# Patient Record
Sex: Female | Born: 1940
Health system: Southern US, Community
[De-identification: ages and names within clinical notes are randomized; demographics above are authoritative.]

## PROBLEM LIST (undated history)

## (undated) DIAGNOSIS — K219 Gastro-esophageal reflux disease without esophagitis: Secondary | ICD-10-CM

## (undated) DIAGNOSIS — I10 Essential (primary) hypertension: Secondary | ICD-10-CM

## (undated) DIAGNOSIS — N189 Chronic kidney disease, unspecified: Secondary | ICD-10-CM

## (undated) DIAGNOSIS — E119 Type 2 diabetes mellitus without complications: Secondary | ICD-10-CM

## (undated) DIAGNOSIS — I4891 Unspecified atrial fibrillation: Secondary | ICD-10-CM

## (undated) DIAGNOSIS — I509 Heart failure, unspecified: Secondary | ICD-10-CM

## (undated) DIAGNOSIS — J189 Pneumonia, unspecified organism: Secondary | ICD-10-CM

## (undated) DIAGNOSIS — J8410 Pulmonary fibrosis, unspecified: Secondary | ICD-10-CM

## (undated) DIAGNOSIS — D649 Anemia, unspecified: Secondary | ICD-10-CM

## (undated) DIAGNOSIS — J449 Chronic obstructive pulmonary disease, unspecified: Secondary | ICD-10-CM

## (undated) DIAGNOSIS — E785 Hyperlipidemia, unspecified: Secondary | ICD-10-CM

## (undated) HISTORY — PX: CATARACT EXTRACTION: SUR2

## (undated) HISTORY — DX: Gastro-esophageal reflux disease without esophagitis: K21.9

## (undated) HISTORY — DX: Heart failure, unspecified: I50.9

## (undated) HISTORY — DX: Unspecified atrial fibrillation: I48.91

## (undated) HISTORY — DX: Hyperlipidemia, unspecified: E78.5

## (undated) HISTORY — PX: CHOLECYSTECTOMY: SHX55

## (undated) HISTORY — DX: Pulmonary fibrosis, unspecified: J84.10

## (undated) HISTORY — PX: ABDOMINAL HYSTERECTOMY: SHX81

## (undated) HISTORY — DX: Essential (primary) hypertension: I10

## (undated) HISTORY — DX: Anemia, unspecified: D64.9

## (undated) HISTORY — DX: Chronic kidney disease, unspecified: N18.9

## (undated) HISTORY — PX: APPENDECTOMY: SHX54

## (undated) HISTORY — PX: OTHER SURGICAL HISTORY: SHX169

## (undated) HISTORY — PX: HERNIA REPAIR: SHX51

## (undated) HISTORY — DX: Pneumonia, unspecified organism: J18.9

## (undated) HISTORY — DX: Type 2 diabetes mellitus without complications: E11.9

---

## 2004-06-07 DIAGNOSIS — I4891 Unspecified atrial fibrillation: Secondary | ICD-10-CM | POA: Insufficient documentation

## 2004-09-24 DIAGNOSIS — I10 Essential (primary) hypertension: Secondary | ICD-10-CM | POA: Insufficient documentation

## 2006-10-03 DIAGNOSIS — E1122 Type 2 diabetes mellitus with diabetic chronic kidney disease: Secondary | ICD-10-CM | POA: Insufficient documentation

## 2007-09-27 ENCOUNTER — Inpatient Hospital Stay: Payer: Self-pay | Admitting: Internal Medicine

## 2007-09-27 ENCOUNTER — Other Ambulatory Visit: Payer: Self-pay

## 2007-09-28 ENCOUNTER — Other Ambulatory Visit: Payer: Self-pay

## 2007-09-30 ENCOUNTER — Other Ambulatory Visit: Payer: Self-pay

## 2008-04-21 ENCOUNTER — Emergency Department: Payer: Self-pay | Admitting: Emergency Medicine

## 2008-04-27 ENCOUNTER — Ambulatory Visit: Payer: Self-pay | Admitting: Internal Medicine

## 2008-04-27 ENCOUNTER — Emergency Department: Payer: Self-pay | Admitting: Emergency Medicine

## 2008-04-29 ENCOUNTER — Ambulatory Visit: Payer: Self-pay | Admitting: Internal Medicine

## 2008-05-05 ENCOUNTER — Other Ambulatory Visit: Payer: Self-pay

## 2008-05-05 ENCOUNTER — Inpatient Hospital Stay: Payer: Self-pay | Admitting: Internal Medicine

## 2008-06-17 ENCOUNTER — Ambulatory Visit: Payer: Self-pay | Admitting: Orthopedic Surgery

## 2008-06-19 ENCOUNTER — Ambulatory Visit: Payer: Self-pay | Admitting: Orthopedic Surgery

## 2008-11-13 ENCOUNTER — Ambulatory Visit: Payer: Self-pay | Admitting: Internal Medicine

## 2008-12-30 ENCOUNTER — Ambulatory Visit: Payer: Self-pay | Admitting: Gastroenterology

## 2008-12-31 ENCOUNTER — Ambulatory Visit: Payer: Self-pay | Admitting: Internal Medicine

## 2009-01-14 ENCOUNTER — Ambulatory Visit: Payer: Self-pay | Admitting: Gastroenterology

## 2009-01-19 ENCOUNTER — Ambulatory Visit: Payer: Self-pay | Admitting: Internal Medicine

## 2009-01-25 ENCOUNTER — Ambulatory Visit: Payer: Self-pay | Admitting: Internal Medicine

## 2009-02-20 ENCOUNTER — Inpatient Hospital Stay: Payer: Self-pay | Admitting: Internal Medicine

## 2009-05-19 ENCOUNTER — Ambulatory Visit: Payer: Self-pay | Admitting: Nephrology

## 2009-08-23 ENCOUNTER — Ambulatory Visit: Payer: Self-pay | Admitting: Otolaryngology

## 2009-09-12 ENCOUNTER — Emergency Department: Payer: Self-pay | Admitting: Emergency Medicine

## 2009-09-18 ENCOUNTER — Inpatient Hospital Stay: Payer: Self-pay | Admitting: Internal Medicine

## 2010-12-30 ENCOUNTER — Ambulatory Visit: Payer: Self-pay

## 2011-01-21 ENCOUNTER — Ambulatory Visit: Payer: Self-pay | Admitting: Internal Medicine

## 2011-02-01 DIAGNOSIS — M25559 Pain in unspecified hip: Secondary | ICD-10-CM | POA: Insufficient documentation

## 2011-02-01 DIAGNOSIS — D481 Neoplasm of uncertain behavior of connective and other soft tissue: Secondary | ICD-10-CM | POA: Insufficient documentation

## 2011-03-15 DIAGNOSIS — D2120 Benign neoplasm of connective and other soft tissue of unspecified lower limb, including hip: Secondary | ICD-10-CM | POA: Insufficient documentation

## 2011-04-10 ENCOUNTER — Emergency Department: Payer: Self-pay | Admitting: Emergency Medicine

## 2011-06-07 ENCOUNTER — Other Ambulatory Visit: Payer: Self-pay | Admitting: Ophthalmology

## 2011-06-20 ENCOUNTER — Ambulatory Visit: Payer: Self-pay | Admitting: Ophthalmology

## 2011-08-29 ENCOUNTER — Inpatient Hospital Stay: Payer: Self-pay | Admitting: Internal Medicine

## 2012-03-01 DIAGNOSIS — E669 Obesity, unspecified: Secondary | ICD-10-CM | POA: Insufficient documentation

## 2012-03-01 DIAGNOSIS — N184 Chronic kidney disease, stage 4 (severe): Secondary | ICD-10-CM | POA: Insufficient documentation

## 2012-03-01 DIAGNOSIS — I5042 Chronic combined systolic (congestive) and diastolic (congestive) heart failure: Secondary | ICD-10-CM | POA: Insufficient documentation

## 2012-03-01 DIAGNOSIS — E1122 Type 2 diabetes mellitus with diabetic chronic kidney disease: Secondary | ICD-10-CM | POA: Insufficient documentation

## 2012-03-01 DIAGNOSIS — I5043 Acute on chronic combined systolic (congestive) and diastolic (congestive) heart failure: Secondary | ICD-10-CM | POA: Insufficient documentation

## 2012-05-14 ENCOUNTER — Ambulatory Visit: Payer: Self-pay | Admitting: Ophthalmology

## 2012-10-11 ENCOUNTER — Ambulatory Visit: Payer: Self-pay | Admitting: Physician Assistant

## 2012-11-15 ENCOUNTER — Ambulatory Visit: Payer: Self-pay | Admitting: Gastroenterology

## 2012-12-07 ENCOUNTER — Emergency Department: Payer: Self-pay | Admitting: Emergency Medicine

## 2012-12-07 LAB — CBC
HCT: 41.7 % (ref 35.0–47.0)
HGB: 13.5 g/dL (ref 12.0–16.0)
MCHC: 32.4 g/dL (ref 32.0–36.0)
RBC: 4.47 10*6/uL (ref 3.80–5.20)
WBC: 9.1 10*3/uL (ref 3.6–11.0)

## 2012-12-07 LAB — BASIC METABOLIC PANEL
Anion Gap: 5 — ABNORMAL LOW (ref 7–16)
Calcium, Total: 8.6 mg/dL (ref 8.5–10.1)
Chloride: 108 mmol/L — ABNORMAL HIGH (ref 98–107)
Co2: 29 mmol/L (ref 21–32)
Creatinine: 1.45 mg/dL — ABNORMAL HIGH (ref 0.60–1.30)
EGFR (African American): 42 — ABNORMAL LOW
Osmolality: 289 (ref 275–301)
Potassium: 4.2 mmol/L (ref 3.5–5.1)

## 2012-12-07 LAB — CK TOTAL AND CKMB (NOT AT ARMC): CK-MB: 2.1 ng/mL (ref 0.5–3.6)

## 2012-12-07 LAB — TROPONIN I: Troponin-I: <0.02 ng/mL

## 2012-12-07 LAB — PRO B NATRIURETIC PEPTIDE: B-Type Natriuretic Peptide: 1173 pg/mL — ABNORMAL HIGH (ref 0–125)

## 2012-12-17 ENCOUNTER — Ambulatory Visit: Payer: Self-pay | Admitting: Gastroenterology

## 2012-12-17 LAB — PROTIME-INR: Prothrombin Time: 13.7 secs (ref 11.5–14.7)

## 2012-12-19 LAB — PATHOLOGY REPORT

## 2013-04-09 ENCOUNTER — Other Ambulatory Visit: Payer: Self-pay | Admitting: Physician Assistant

## 2013-04-09 ENCOUNTER — Ambulatory Visit: Payer: Self-pay | Admitting: Physician Assistant

## 2013-04-09 LAB — CBC WITH DIFFERENTIAL/PLATELET
Basophil #: 0.1 10*3/uL (ref 0.0–0.1)
Eosinophil %: 2 %
HCT: 38.9 % (ref 35.0–47.0)
HGB: 12.8 g/dL (ref 12.0–16.0)
Lymphocyte #: 2.5 10*3/uL (ref 1.0–3.6)
Lymphocyte %: 34.3 %
MCH: 30.3 pg (ref 26.0–34.0)
MCV: 92 fL (ref 80–100)
Monocyte %: 6.2 %
Neutrophil %: 56.8 %
RBC: 4.21 10*6/uL (ref 3.80–5.20)
RDW: 14.4 % (ref 11.5–14.5)

## 2013-04-09 LAB — COMPREHENSIVE METABOLIC PANEL
Albumin: 2.6 g/dL — ABNORMAL LOW (ref 3.4–5.0)
Alkaline Phosphatase: 75 U/L (ref 50–136)
BUN: 18 mg/dL (ref 7–18)
Bilirubin,Total: 0.4 mg/dL (ref 0.2–1.0)
Calcium, Total: 8.8 mg/dL (ref 8.5–10.1)
Chloride: 106 mmol/L (ref 98–107)
Co2: 30 mmol/L (ref 21–32)
EGFR (African American): 35 — ABNORMAL LOW
Osmolality: 284 (ref 275–301)
Potassium: 4.1 mmol/L (ref 3.5–5.1)
SGOT(AST): 33 U/L (ref 15–37)
Sodium: 140 mmol/L (ref 136–145)

## 2013-07-27 ENCOUNTER — Emergency Department: Payer: Self-pay | Admitting: Emergency Medicine

## 2013-11-16 ENCOUNTER — Emergency Department: Payer: Self-pay | Admitting: Internal Medicine

## 2013-11-16 LAB — CBC
HCT: 42.4 % (ref 35.0–47.0)
HGB: 14.1 g/dL (ref 12.0–16.0)
MCH: 31.2 pg (ref 26.0–34.0)
MCHC: 33.2 g/dL (ref 32.0–36.0)
MCV: 94 fL (ref 80–100)
Platelet: 230 10*3/uL (ref 150–440)
RBC: 4.51 10*6/uL (ref 3.80–5.20)
RDW: 14.3 % (ref 11.5–14.5)
WBC: 8.4 10*3/uL (ref 3.6–11.0)

## 2013-11-16 LAB — PROTIME-INR
INR: 1.3
Prothrombin Time: 16 secs — ABNORMAL HIGH (ref 11.5–14.7)

## 2013-11-16 LAB — COMPREHENSIVE METABOLIC PANEL
ALBUMIN: 2.9 g/dL — AB (ref 3.4–5.0)
ANION GAP: 5 — AB (ref 7–16)
AST: 22 U/L (ref 15–37)
Alkaline Phosphatase: 82 U/L
BILIRUBIN TOTAL: 0.5 mg/dL (ref 0.2–1.0)
BUN: 20 mg/dL — ABNORMAL HIGH (ref 7–18)
CHLORIDE: 104 mmol/L (ref 98–107)
Calcium, Total: 8.9 mg/dL (ref 8.5–10.1)
Co2: 26 mmol/L (ref 21–32)
Creatinine: 1.28 mg/dL (ref 0.60–1.30)
EGFR (African American): 48 — ABNORMAL LOW
EGFR (Non-African Amer.): 42 — ABNORMAL LOW
GLUCOSE: 145 mg/dL — AB (ref 65–99)
Osmolality: 275 (ref 275–301)
Potassium: 3.8 mmol/L (ref 3.5–5.1)
SGPT (ALT): 13 U/L (ref 12–78)
SODIUM: 135 mmol/L — AB (ref 136–145)
Total Protein: 7.8 g/dL (ref 6.4–8.2)

## 2013-11-16 LAB — TROPONIN I: Troponin-I: <0.02 ng/mL

## 2013-11-16 LAB — PRO B NATRIURETIC PEPTIDE: B-Type Natriuretic Peptide: 728 pg/mL — ABNORMAL HIGH (ref 0–125)

## 2014-03-02 ENCOUNTER — Ambulatory Visit: Payer: Self-pay | Admitting: Internal Medicine

## 2014-06-30 LAB — CBC
HCT: 41.3 % (ref 35.0–47.0)
HGB: 13.5 g/dL (ref 12.0–16.0)
MCH: 31.9 pg (ref 26.0–34.0)
MCHC: 32.5 g/dL (ref 32.0–36.0)
MCV: 98 fL (ref 80–100)
PLATELETS: 181 10*3/uL (ref 150–440)
RBC: 4.22 10*6/uL (ref 3.80–5.20)
RDW: 14.6 % — ABNORMAL HIGH (ref 11.5–14.5)
WBC: 11.4 10*3/uL — AB (ref 3.6–11.0)

## 2014-06-30 LAB — BASIC METABOLIC PANEL
Anion Gap: 8 (ref 7–16)
BUN: 10 mg/dL (ref 7–18)
CO2: 28 mmol/L (ref 21–32)
Calcium, Total: 8.1 mg/dL — ABNORMAL LOW (ref 8.5–10.1)
Chloride: 106 mmol/L (ref 98–107)
Creatinine: 1.41 mg/dL — ABNORMAL HIGH (ref 0.60–1.30)
EGFR (African American): 43 — ABNORMAL LOW
GFR CALC NON AF AMER: 37 — AB
Glucose: 141 mg/dL — ABNORMAL HIGH (ref 65–99)
Osmolality: 285 (ref 275–301)
POTASSIUM: 4.2 mmol/L (ref 3.5–5.1)
SODIUM: 142 mmol/L (ref 136–145)

## 2014-06-30 LAB — TROPONIN I

## 2014-07-01 ENCOUNTER — Observation Stay: Payer: Self-pay | Admitting: Internal Medicine

## 2014-07-01 LAB — PROTIME-INR
INR: 1.5
Prothrombin Time: 17.4 secs — ABNORMAL HIGH (ref 11.5–14.7)

## 2014-07-01 LAB — LIPID PANEL
Cholesterol: 89 mg/dL (ref 0–200)
HDL Cholesterol: 36 mg/dL — ABNORMAL LOW (ref 40–60)
Ldl Cholesterol, Calc: 37 mg/dL (ref 0–100)
Triglycerides: 82 mg/dL (ref 0–200)
VLDL Cholesterol, Calc: 16 mg/dL (ref 5–40)

## 2014-07-01 LAB — PRO B NATRIURETIC PEPTIDE: B-Type Natriuretic Peptide: 5126 pg/mL — ABNORMAL HIGH (ref 0–125)

## 2014-07-02 LAB — BASIC METABOLIC PANEL
Anion Gap: 7 (ref 7–16)
BUN: 19 mg/dL — ABNORMAL HIGH (ref 7–18)
Calcium, Total: 8.4 mg/dL — ABNORMAL LOW (ref 8.5–10.1)
Chloride: 101 mmol/L (ref 98–107)
Co2: 31 mmol/L (ref 21–32)
Creatinine: 1.48 mg/dL — ABNORMAL HIGH (ref 0.60–1.30)
EGFR (African American): 40 — ABNORMAL LOW
EGFR (Non-African Amer.): 35 — ABNORMAL LOW
Glucose: 86 mg/dL (ref 65–99)
Osmolality: 279 (ref 275–301)
Potassium: 4.1 mmol/L (ref 3.5–5.1)
Sodium: 139 mmol/L (ref 136–145)

## 2014-07-02 LAB — PROTIME-INR
INR: 1.6
INR: 1.6
Prothrombin Time: 18.4 secs — ABNORMAL HIGH (ref 11.5–14.7)
Prothrombin Time: 18.7 secs — ABNORMAL HIGH (ref 11.5–14.7)

## 2014-07-02 LAB — TSH: Thyroid Stimulating Horm: 2.31 u[IU]/mL

## 2014-07-03 LAB — BASIC METABOLIC PANEL
Anion Gap: 8 (ref 7–16)
BUN: 29 mg/dL — ABNORMAL HIGH (ref 7–18)
Calcium, Total: 8.3 mg/dL — ABNORMAL LOW (ref 8.5–10.1)
Chloride: 100 mmol/L (ref 98–107)
Co2: 29 mmol/L (ref 21–32)
Creatinine: 1.67 mg/dL — ABNORMAL HIGH (ref 0.60–1.30)
EGFR (African American): 35 — ABNORMAL LOW
GFR CALC NON AF AMER: 30 — AB
GLUCOSE: 136 mg/dL — AB (ref 65–99)
Osmolality: 282 (ref 275–301)
Potassium: 3.7 mmol/L (ref 3.5–5.1)
Sodium: 137 mmol/L (ref 136–145)

## 2014-07-03 LAB — PROTIME-INR
INR: 1.7
PROTHROMBIN TIME: 19.5 s — AB (ref 11.5–14.7)

## 2014-07-06 LAB — CULTURE, BLOOD (SINGLE)

## 2014-10-29 DIAGNOSIS — R319 Hematuria, unspecified: Secondary | ICD-10-CM | POA: Diagnosis not present

## 2014-11-04 DIAGNOSIS — J449 Chronic obstructive pulmonary disease, unspecified: Secondary | ICD-10-CM | POA: Diagnosis not present

## 2014-11-04 DIAGNOSIS — E1142 Type 2 diabetes mellitus with diabetic polyneuropathy: Secondary | ICD-10-CM | POA: Diagnosis not present

## 2014-11-04 DIAGNOSIS — G4733 Obstructive sleep apnea (adult) (pediatric): Secondary | ICD-10-CM | POA: Diagnosis not present

## 2014-11-12 DIAGNOSIS — I130 Hypertensive heart and chronic kidney disease with heart failure and stage 1 through stage 4 chronic kidney disease, or unspecified chronic kidney disease: Secondary | ICD-10-CM | POA: Diagnosis not present

## 2014-11-12 DIAGNOSIS — I482 Chronic atrial fibrillation: Secondary | ICD-10-CM | POA: Diagnosis not present

## 2014-11-12 DIAGNOSIS — Z7901 Long term (current) use of anticoagulants: Secondary | ICD-10-CM | POA: Diagnosis not present

## 2014-11-12 DIAGNOSIS — K210 Gastro-esophageal reflux disease with esophagitis: Secondary | ICD-10-CM | POA: Diagnosis not present

## 2014-11-12 DIAGNOSIS — E1142 Type 2 diabetes mellitus with diabetic polyneuropathy: Secondary | ICD-10-CM | POA: Diagnosis not present

## 2014-11-12 DIAGNOSIS — N390 Urinary tract infection, site not specified: Secondary | ICD-10-CM | POA: Diagnosis not present

## 2014-11-12 DIAGNOSIS — R319 Hematuria, unspecified: Secondary | ICD-10-CM | POA: Diagnosis not present

## 2014-11-12 DIAGNOSIS — M542 Cervicalgia: Secondary | ICD-10-CM | POA: Diagnosis not present

## 2014-11-17 DIAGNOSIS — G4733 Obstructive sleep apnea (adult) (pediatric): Secondary | ICD-10-CM | POA: Diagnosis not present

## 2014-11-30 DIAGNOSIS — G4733 Obstructive sleep apnea (adult) (pediatric): Secondary | ICD-10-CM | POA: Diagnosis not present

## 2014-12-09 DIAGNOSIS — N183 Chronic kidney disease, stage 3 (moderate): Secondary | ICD-10-CM | POA: Diagnosis not present

## 2014-12-09 DIAGNOSIS — G4733 Obstructive sleep apnea (adult) (pediatric): Secondary | ICD-10-CM | POA: Diagnosis not present

## 2014-12-09 DIAGNOSIS — I10 Essential (primary) hypertension: Secondary | ICD-10-CM | POA: Diagnosis not present

## 2014-12-31 DIAGNOSIS — R319 Hematuria, unspecified: Secondary | ICD-10-CM | POA: Diagnosis not present

## 2015-01-07 DIAGNOSIS — I130 Hypertensive heart and chronic kidney disease with heart failure and stage 1 through stage 4 chronic kidney disease, or unspecified chronic kidney disease: Secondary | ICD-10-CM | POA: Diagnosis not present

## 2015-01-07 DIAGNOSIS — F330 Major depressive disorder, recurrent, mild: Secondary | ICD-10-CM | POA: Diagnosis not present

## 2015-01-07 DIAGNOSIS — R319 Hematuria, unspecified: Secondary | ICD-10-CM | POA: Diagnosis not present

## 2015-01-07 DIAGNOSIS — I482 Chronic atrial fibrillation: Secondary | ICD-10-CM | POA: Diagnosis not present

## 2015-01-07 DIAGNOSIS — J449 Chronic obstructive pulmonary disease, unspecified: Secondary | ICD-10-CM | POA: Diagnosis not present

## 2015-01-07 DIAGNOSIS — Z7901 Long term (current) use of anticoagulants: Secondary | ICD-10-CM | POA: Diagnosis not present

## 2015-02-10 DIAGNOSIS — I482 Chronic atrial fibrillation: Secondary | ICD-10-CM | POA: Diagnosis not present

## 2015-02-10 DIAGNOSIS — Z7901 Long term (current) use of anticoagulants: Secondary | ICD-10-CM | POA: Diagnosis not present

## 2015-02-10 DIAGNOSIS — I130 Hypertensive heart and chronic kidney disease with heart failure and stage 1 through stage 4 chronic kidney disease, or unspecified chronic kidney disease: Secondary | ICD-10-CM | POA: Diagnosis not present

## 2015-02-10 DIAGNOSIS — M25511 Pain in right shoulder: Secondary | ICD-10-CM | POA: Diagnosis not present

## 2015-02-12 DIAGNOSIS — M25511 Pain in right shoulder: Secondary | ICD-10-CM | POA: Diagnosis not present

## 2015-02-12 DIAGNOSIS — I130 Hypertensive heart and chronic kidney disease with heart failure and stage 1 through stage 4 chronic kidney disease, or unspecified chronic kidney disease: Secondary | ICD-10-CM | POA: Diagnosis not present

## 2015-02-12 DIAGNOSIS — I482 Chronic atrial fibrillation: Secondary | ICD-10-CM | POA: Diagnosis not present

## 2015-02-13 NOTE — H&P (Signed)
PATIENT NAME:  [name], [name] MR#:  [id number] DATE OF BIRTH:  12-05-1940  DATE OF ADMISSION:  07/01/2014  REFERRING PHYSICIAN: Valli Glance. Owens Shark, MD  PRIMARY CARE PHYSICIAN: Dr. Radford Pax.  ADMIT DIAGNOSIS: Pneumonia and possible pulmonary edema.   HISTORY OF PRESENT ILLNESS: This is a 74 year old Caucasian female who presents to the Emergency Department after experiencing shortness of breath for a week. Prior to deciding to come to the hospital, the patient felt like she just could not breathe anymore. This has been an intermittent problem for 1 week now. She has been seeing her primary care doctor over this period of time in order to control her atrial fibrillation. He has started her on multiple antiarrhythmics, but the patient has become progressively weaker. She complains of cough as well, but denies any chest pain. Chest x-ray revealed some patchy opacities as well as interstitial changes that could be consistent with edema, which prompted the Emergency Department to call for admission.   REVIEW OF SYSTEMS:  CONSTITUTIONAL: The patient denies fever, but admits to weakness.  EYES: The patient denies diplopia or inflammation.  ENT: The patient denies tinnitus or difficulty swallowing.  RESPIRATORY: The patient admits to cough and shortness of breath.  CARDIOVASCULAR: The patient denies chest pain, orthopnea or palpitations.  GASTROINTESTINAL: The patient denies nausea, vomiting or abdominal pain.  GENITOURINARY: The patient denies dysuria, increased frequency or hesitancy.  ENDOCRINE: The patient denies polyuria or nocturia.  INTEGUMENTARY: The patient denies rashes or lesions.  HEMATOLOGIC AND LYMPHATIC: The patient denies easy bruising or bleeding.  MUSCULOSKELETAL: The patient denies myalgias or arthralgias.  NEUROLOGIC: The patient denies numbness in her extremities or dysarthria.  PSYCHIATRIC: The patient denies depression or suicidal ideation.   PAST MEDICAL HISTORY:  1. Atrial  fibrillation.  2. Diabetes type 2.  3. Restless leg syndrome.  4. Hyperlipidemia. 5. Nephrolithiasis.   PAST SURGICAL HISTORY:  1. Cholecystectomy. 2. Nephrectomy secondary to nephrolithiasis.  3. Hernia repair.  4. Appendectomy. 5. Partial hysterectomy.   FAMILY HISTORY: Diabetes in her mother and brother. Her mother is deceased of breast cancer, and her father is deceased of liver cancer.   SOCIAL HISTORY: The patient lives with her husband and daughter. She has a 25-pack-year history of smoking. She denies any alcohol or drug use.   HOME MEDICATIONS: 1. Advair Diskus 250 mcg/50 mcg inhaled powder 1 inhalation b.i.d.  2. Ambien 5 mg 1 tab p.o. daily at bedtime.  3. Amiodarone 200 mg 1 tab p.o. daily.  4. Cardizem 60 mg 1 tab p.o. daily.  5. Celexa 20 mg 1 tab p.o. daily.  6. Coumadin 3.5 mg 1 tab p.o. daily.  7. Glimepiride 2 mg 2 tabs p.o. daily.  8. Insulin aspart per sliding scale.  9. Lantus dose unknown at lunchtime.  10. Lasix 20 mg 1 tab p.o. b.i.d.  11. Levaquin 500 mg 1 tab p.o. for the last 3 days.  12. Nitroglycerin 0.4 mg sublingual tablets 1 tablet sublingually every 5 minutes as needed for chest pain.  13. Omeprazole 20 mg 1 capsule p.o. daily.  14. Ondansetron 4 mg 1 tab p.o. 3 times a day as needed for nausea or vomiting.  15. Percocet 5/325 mg 1 tablet p.o. every 6 hours as needed for pain.  16. Requip 0.5 mg 1 tab p.o. at bedtime.  17. Spiriva 18 mcg 1 inhalation daily.   ALLERGIES: CORN, EGGS AND MILK.  PERTINENT LABORATORY RESULTS AND RADIOGRAPHIC FINDINGS: Serum glucose is 141. BNP is 5126. BUN  is 10, creatinine is 1.41, sodium 142. Troponin is negative. White blood cell count is 11.4. ABG shows a pH of 7.39, pCO2 of 41, pO2 of 53, base excess of negative 0.2 with a bicarbonate of 24.8 on room air. Chest x-ray shows progression of some interstitial markings that could be consistent with chronic interstitial lung disease or possible superimposed  interstitial pneumonia along with a patchy lingular opacity that possibly reflects pneumonia.   ASSESSMENT AND PLAN: This is a 74 year old female with pneumonia and pulmonary edema.   1. Pneumonia. The patient is comfortable on room air. She has been given Levaquin IV in the Emergency Department. Will continue IV antibiotics and follow blood cultures.  2. Sepsis. The patient technically meets criteria by leukocytosis and tachypnea. Will continue IV antibiotics until we prove that she does not have septicemia.  3. Atrial fibrillation. The patient is not yet rate controlled. Will continue amiodarone and diltiazem as well as Coumadin.  4. Diabetes type 2. The patient will be on sliding scale insulin for now. Will add her basal dose when her medication reconciliation is complete.  5. Chronic obstructive pulmonary disease. Continue her home regimen of inhalers.  6. Hyperlipidemia. The patient does not appear to be on a statin at this time. Will check a lipid panel and initiate this if she so desires.  7. Deep vein thrombosis prophylaxis. Continue heparin targeted to an INR of 2 for atrial fibrillation as well as SCDs.  8. Gastrointestinal prophylaxis. Pantoprazole.   TIME SPENT ON ADMISSION ORDERS AND PATIENT CARE: Approximately 35 minutes.   CODE STATUS: The patient is a full code.   ____________________________ Norva Riffle. Marcille Blanco, MD msd:lb D: 07/01/2014 05:56:30 ET T: 07/01/2014 06:18:18 ET JOB#: [id number]  cc: Norva Riffle. Marcille Blanco, MD, <Dictator> Norva Riffle Oliveah Zwack MD ELECTRONICALLY SIGNED 07/10/2014 6:54

## 2015-02-13 NOTE — Discharge Summary (Signed)
PATIENT NAME:  [name], [name] MR#:  [id number] DATE OF BIRTH:  09-29-1941  DATE OF ADMISSION:  07/01/2014 DATE OF DISCHARGE:    PRIMARY CARE PHYSICIAN:  Dr. Leta Baptist.    FINAL DIAGNOSES:  1. Acute respiratory failure, which resolved.  2. Pneumonia.  3. Acute on chronic systolic congestive heart failure with cardiomyopathy.  4. Chronic obstructive pulmonary disease.  5. Diabetes.   MEDICATIONS ON DISCHARGE: Include Requip 0.5 mg at bedtime, glimepiride 2 mg 1-1/2 tablets by mouth in the morning and 2 at night, Ambien 5 mg at bedtime, Advair Diskus 250/50 one inhalation twice a day, Spiriva 1 inhalation daily, vitamin D2 50,000 units 1 capsule once a week for vitamin D deficiency, atorvastatin 1 tablet at bedtime, Coreg 3.125 mg twice a day, Celexa 40 mg daily, lisinopril decreased down to 2.5 mg daily, Coumadin increased to 4 mg daily at 5:00 p.m., furosemide 20 mg daily, doxycycline 100 mg daily for every 12 hours for 7  days, codeine guaifenesin 10-100, 5 mL-10 mL every 8 hours as needed for cough.   DIET: Low-sodium, carbohydrate -controlled diet, regular consistency.   FOLLOWUP: 1 to 2 weeks with Dr. Leta Baptist. Check INR weekly.   HOSPITAL COURSE: The patient was admitted 07/01/2014, discharged 07/03/2014. Came in with pneumonia. The patient had clinical sepsis with leukocytosis and tachypnea, started on IV antibiotics. For diabetes she was put on sliding scale. She was started on Rocephin and doxycycline by me on the following day, was given a dose of Levaquin in the ER. Laboratory and radiological data during the hospital course included EKG that showed atrial fibrillation, rapid ventricular response,  nonspecific ST-T wave abnormality. INR 1.5. LDL 37, HDL 36, triglycerides 82. BNP 5,126. Glucose 141, BUN 10, creatinine 1.41, sodium 142, potassium 4.2, chloride 106, CO2 of 28, calcium 8.1. Troponin negative. White blood cell count 11.4, hemoglobin and hematocrit 13.5 and 41.3, platelet  count of 181,000. Chest x-ray, progression interstitial markings, could  reflect progression of chronic interstitial lung disease or possibly superimposed interstitial edema. ABG showed a pH of 7.39, pCO2 of 41, pO2 of 53, that was on room air, O2 saturation 91.6. Blood cultures were negative. Echocardiogram showed EF of 25%-30%, moderately to severely decreased global left ventricular systolic function, moderately dilated left atrium and right atrium, moderate to severe mitral regurgitation. Creatinine upon discharge 1.67. INR 1.7. TSH 2.31.   HOSPITAL COURSE PER PROBLEM LIST:  1. For the patient's acute respiratory failure, ABG showed a pO2 of 53, this had resolved. She was breathing comfortably on room air upon discharge.  2. Pneumonia. The patient was given Levaquin in the ER, I switched that to Rocephin and doxycycline and doxycycline upon discharge.  3. For the patient's acute on chronic systolic congestive heart failure with cardiomyopathy and moderate to severe mitral regurgitation, this will be a tough fluid balance with chronic kidney disease. The patient's blood pressure has been on the low side during the entire hospital course. I did give low dose Coreg, I decreased the dose of the patient's lisinopril to 2.5 mg, and decrease the dose of the Lasix to 20 mg.  The patient did receive IV Lasix but then dropped her blood pressure. Lungs were clear upon discharge. I think the pneumonia may have triggered things a little bit, can titrate medications up as outpatient.  4. COPD. Continue inhalers.  5. Diabetes on glimepiride.  6. Chronic kidney disease. Watch closely with diuresis. Recommend checking a BMP as an outpatient.  7. Atrial  fibrillation, now rate controlled. Heart rate upon discharge 57.    TIME SPENT ON DISCHARGE: 35 minutes.    ____________________________ Tana Conch. Leslye Peer, MD rjw:bu D: 07/03/2014 16:10:50 ET T: 07/03/2014 16:30:38 ET JOB#: [id number]  cc: Tana Conch. Leslye Peer, MD,  <Dictator> Leighton Ruff. Turner, PA-C Marisue Brooklyn MD ELECTRONICALLY SIGNED 07/07/2014 12:40

## 2015-02-18 DIAGNOSIS — E119 Type 2 diabetes mellitus without complications: Secondary | ICD-10-CM | POA: Diagnosis not present

## 2015-02-18 DIAGNOSIS — R312 Other microscopic hematuria: Secondary | ICD-10-CM | POA: Diagnosis not present

## 2015-02-18 DIAGNOSIS — J449 Chronic obstructive pulmonary disease, unspecified: Secondary | ICD-10-CM | POA: Diagnosis not present

## 2015-02-18 DIAGNOSIS — K5900 Constipation, unspecified: Secondary | ICD-10-CM | POA: Diagnosis not present

## 2015-02-18 DIAGNOSIS — Z79899 Other long term (current) drug therapy: Secondary | ICD-10-CM | POA: Diagnosis not present

## 2015-02-18 DIAGNOSIS — R319 Hematuria, unspecified: Secondary | ICD-10-CM | POA: Diagnosis not present

## 2015-02-18 DIAGNOSIS — Z905 Acquired absence of kidney: Secondary | ICD-10-CM | POA: Diagnosis not present

## 2015-02-18 DIAGNOSIS — Z87891 Personal history of nicotine dependence: Secondary | ICD-10-CM | POA: Diagnosis not present

## 2015-02-23 DIAGNOSIS — N183 Chronic kidney disease, stage 3 (moderate): Secondary | ICD-10-CM | POA: Diagnosis not present

## 2015-02-23 DIAGNOSIS — I482 Chronic atrial fibrillation: Secondary | ICD-10-CM | POA: Diagnosis not present

## 2015-02-23 DIAGNOSIS — Z5181 Encounter for therapeutic drug level monitoring: Secondary | ICD-10-CM | POA: Diagnosis not present

## 2015-02-25 DIAGNOSIS — I482 Chronic atrial fibrillation: Secondary | ICD-10-CM | POA: Diagnosis not present

## 2015-02-25 DIAGNOSIS — R001 Bradycardia, unspecified: Secondary | ICD-10-CM | POA: Diagnosis not present

## 2015-02-25 DIAGNOSIS — N183 Chronic kidney disease, stage 3 (moderate): Secondary | ICD-10-CM | POA: Diagnosis not present

## 2015-02-25 DIAGNOSIS — G4733 Obstructive sleep apnea (adult) (pediatric): Secondary | ICD-10-CM | POA: Diagnosis not present

## 2015-02-25 DIAGNOSIS — Z7901 Long term (current) use of anticoagulants: Secondary | ICD-10-CM | POA: Diagnosis not present

## 2015-02-25 DIAGNOSIS — E1142 Type 2 diabetes mellitus with diabetic polyneuropathy: Secondary | ICD-10-CM | POA: Diagnosis not present

## 2015-02-25 DIAGNOSIS — R319 Hematuria, unspecified: Secondary | ICD-10-CM | POA: Diagnosis not present

## 2015-03-12 DIAGNOSIS — E109 Type 1 diabetes mellitus without complications: Secondary | ICD-10-CM | POA: Diagnosis not present

## 2015-03-12 DIAGNOSIS — H5210 Myopia, unspecified eye: Secondary | ICD-10-CM | POA: Diagnosis not present

## 2015-03-12 DIAGNOSIS — H5200 Hypermetropia, unspecified eye: Secondary | ICD-10-CM | POA: Diagnosis not present

## 2015-03-18 DIAGNOSIS — Z9049 Acquired absence of other specified parts of digestive tract: Secondary | ICD-10-CM | POA: Diagnosis not present

## 2015-03-18 DIAGNOSIS — R319 Hematuria, unspecified: Secondary | ICD-10-CM | POA: Diagnosis not present

## 2015-03-18 DIAGNOSIS — N200 Calculus of kidney: Secondary | ICD-10-CM | POA: Diagnosis not present

## 2015-03-18 DIAGNOSIS — L905 Scar conditions and fibrosis of skin: Secondary | ICD-10-CM | POA: Diagnosis not present

## 2015-03-18 DIAGNOSIS — M47819 Spondylosis without myelopathy or radiculopathy, site unspecified: Secondary | ICD-10-CM | POA: Diagnosis not present

## 2015-03-18 DIAGNOSIS — R935 Abnormal findings on diagnostic imaging of other abdominal regions, including retroperitoneum: Secondary | ICD-10-CM | POA: Diagnosis not present

## 2015-03-18 DIAGNOSIS — Z90710 Acquired absence of both cervix and uterus: Secondary | ICD-10-CM | POA: Diagnosis not present

## 2015-03-19 DIAGNOSIS — R001 Bradycardia, unspecified: Secondary | ICD-10-CM | POA: Diagnosis not present

## 2015-03-19 DIAGNOSIS — I130 Hypertensive heart and chronic kidney disease with heart failure and stage 1 through stage 4 chronic kidney disease, or unspecified chronic kidney disease: Secondary | ICD-10-CM | POA: Diagnosis not present

## 2015-03-19 DIAGNOSIS — E1142 Type 2 diabetes mellitus with diabetic polyneuropathy: Secondary | ICD-10-CM | POA: Diagnosis not present

## 2015-03-19 DIAGNOSIS — I482 Chronic atrial fibrillation: Secondary | ICD-10-CM | POA: Diagnosis not present

## 2015-03-19 DIAGNOSIS — Z7901 Long term (current) use of anticoagulants: Secondary | ICD-10-CM | POA: Diagnosis not present

## 2015-05-27 DIAGNOSIS — I130 Hypertensive heart and chronic kidney disease with heart failure and stage 1 through stage 4 chronic kidney disease, or unspecified chronic kidney disease: Secondary | ICD-10-CM | POA: Diagnosis not present

## 2015-05-27 DIAGNOSIS — I482 Chronic atrial fibrillation: Secondary | ICD-10-CM | POA: Diagnosis not present

## 2015-05-27 DIAGNOSIS — E1142 Type 2 diabetes mellitus with diabetic polyneuropathy: Secondary | ICD-10-CM | POA: Diagnosis not present

## 2015-05-27 DIAGNOSIS — Z7901 Long term (current) use of anticoagulants: Secondary | ICD-10-CM | POA: Diagnosis not present

## 2015-06-30 DIAGNOSIS — I482 Chronic atrial fibrillation: Secondary | ICD-10-CM | POA: Diagnosis not present

## 2015-06-30 DIAGNOSIS — E1142 Type 2 diabetes mellitus with diabetic polyneuropathy: Secondary | ICD-10-CM | POA: Diagnosis not present

## 2015-06-30 DIAGNOSIS — Z7901 Long term (current) use of anticoagulants: Secondary | ICD-10-CM | POA: Diagnosis not present

## 2015-08-16 ENCOUNTER — Other Ambulatory Visit: Payer: Self-pay | Admitting: Internal Medicine

## 2015-08-16 DIAGNOSIS — Z1231 Encounter for screening mammogram for malignant neoplasm of breast: Secondary | ICD-10-CM

## 2015-08-16 DIAGNOSIS — G2581 Restless legs syndrome: Secondary | ICD-10-CM | POA: Diagnosis not present

## 2015-08-16 DIAGNOSIS — I482 Chronic atrial fibrillation: Secondary | ICD-10-CM | POA: Diagnosis not present

## 2015-08-16 DIAGNOSIS — I10 Essential (primary) hypertension: Secondary | ICD-10-CM | POA: Diagnosis not present

## 2015-08-16 DIAGNOSIS — E1142 Type 2 diabetes mellitus with diabetic polyneuropathy: Secondary | ICD-10-CM | POA: Diagnosis not present

## 2015-08-16 DIAGNOSIS — N183 Chronic kidney disease, stage 3 (moderate): Secondary | ICD-10-CM | POA: Diagnosis not present

## 2015-08-16 DIAGNOSIS — Z0001 Encounter for general adult medical examination with abnormal findings: Secondary | ICD-10-CM | POA: Diagnosis not present

## 2015-08-16 DIAGNOSIS — I509 Heart failure, unspecified: Secondary | ICD-10-CM | POA: Diagnosis not present

## 2015-08-16 DIAGNOSIS — G4733 Obstructive sleep apnea (adult) (pediatric): Secondary | ICD-10-CM | POA: Diagnosis not present

## 2015-08-17 ENCOUNTER — Other Ambulatory Visit: Payer: Self-pay | Admitting: Physician Assistant

## 2015-08-17 DIAGNOSIS — R319 Hematuria, unspecified: Secondary | ICD-10-CM

## 2015-08-24 ENCOUNTER — Ambulatory Visit
Admission: RE | Admit: 2015-08-24 | Discharge: 2015-08-24 | Disposition: A | Discharge status: Home / Self Care | Payer: Commercial Managed Care - HMO | Source: Ambulatory Visit | Attending: Physician Assistant | Admitting: Physician Assistant

## 2015-08-24 DIAGNOSIS — R319 Hematuria, unspecified: Secondary | ICD-10-CM | POA: Diagnosis not present

## 2015-08-24 DIAGNOSIS — N1330 Unspecified hydronephrosis: Secondary | ICD-10-CM | POA: Insufficient documentation

## 2015-08-24 DIAGNOSIS — R3129 Other microscopic hematuria: Secondary | ICD-10-CM | POA: Diagnosis not present

## 2015-08-24 DIAGNOSIS — Z905 Acquired absence of kidney: Secondary | ICD-10-CM | POA: Diagnosis not present

## 2015-08-25 ENCOUNTER — Ambulatory Visit
Admission: RE | Admit: 2015-08-25 | Discharge: 2015-08-25 | Disposition: A | Discharge status: Home / Self Care | Payer: Commercial Managed Care - HMO | Source: Ambulatory Visit | Attending: Internal Medicine | Admitting: Internal Medicine

## 2015-08-25 DIAGNOSIS — Z1231 Encounter for screening mammogram for malignant neoplasm of breast: Secondary | ICD-10-CM | POA: Diagnosis not present

## 2015-12-15 DIAGNOSIS — J449 Chronic obstructive pulmonary disease, unspecified: Secondary | ICD-10-CM | POA: Diagnosis not present

## 2015-12-15 DIAGNOSIS — R1010 Upper abdominal pain, unspecified: Secondary | ICD-10-CM | POA: Diagnosis not present

## 2015-12-15 DIAGNOSIS — K210 Gastro-esophageal reflux disease with esophagitis: Secondary | ICD-10-CM | POA: Diagnosis not present

## 2015-12-15 DIAGNOSIS — E1165 Type 2 diabetes mellitus with hyperglycemia: Secondary | ICD-10-CM | POA: Diagnosis not present

## 2015-12-15 DIAGNOSIS — R319 Hematuria, unspecified: Secondary | ICD-10-CM | POA: Diagnosis not present

## 2015-12-15 DIAGNOSIS — K5900 Constipation, unspecified: Secondary | ICD-10-CM | POA: Diagnosis not present

## 2015-12-15 DIAGNOSIS — Z7901 Long term (current) use of anticoagulants: Secondary | ICD-10-CM | POA: Diagnosis not present

## 2015-12-15 DIAGNOSIS — I482 Chronic atrial fibrillation: Secondary | ICD-10-CM | POA: Diagnosis not present

## 2015-12-15 DIAGNOSIS — E1142 Type 2 diabetes mellitus with diabetic polyneuropathy: Secondary | ICD-10-CM | POA: Diagnosis not present

## 2016-01-07 DIAGNOSIS — E1165 Type 2 diabetes mellitus with hyperglycemia: Secondary | ICD-10-CM | POA: Diagnosis not present

## 2016-01-07 DIAGNOSIS — K5900 Constipation, unspecified: Secondary | ICD-10-CM | POA: Diagnosis not present

## 2016-01-07 DIAGNOSIS — G4733 Obstructive sleep apnea (adult) (pediatric): Secondary | ICD-10-CM | POA: Diagnosis not present

## 2016-01-07 DIAGNOSIS — Z7901 Long term (current) use of anticoagulants: Secondary | ICD-10-CM | POA: Diagnosis not present

## 2016-01-07 DIAGNOSIS — I482 Chronic atrial fibrillation: Secondary | ICD-10-CM | POA: Diagnosis not present

## 2016-01-07 DIAGNOSIS — R1010 Upper abdominal pain, unspecified: Secondary | ICD-10-CM | POA: Diagnosis not present

## 2016-02-09 DIAGNOSIS — Z7901 Long term (current) use of anticoagulants: Secondary | ICD-10-CM | POA: Diagnosis not present

## 2016-02-09 DIAGNOSIS — I482 Chronic atrial fibrillation: Secondary | ICD-10-CM | POA: Diagnosis not present

## 2016-02-09 DIAGNOSIS — K210 Gastro-esophageal reflux disease with esophagitis: Secondary | ICD-10-CM | POA: Diagnosis not present

## 2016-02-09 DIAGNOSIS — E1165 Type 2 diabetes mellitus with hyperglycemia: Secondary | ICD-10-CM | POA: Diagnosis not present

## 2016-03-08 DIAGNOSIS — I482 Chronic atrial fibrillation: Secondary | ICD-10-CM | POA: Diagnosis not present

## 2016-03-08 DIAGNOSIS — Z7901 Long term (current) use of anticoagulants: Secondary | ICD-10-CM | POA: Diagnosis not present

## 2016-04-05 DIAGNOSIS — K210 Gastro-esophageal reflux disease with esophagitis: Secondary | ICD-10-CM | POA: Diagnosis not present

## 2016-04-05 DIAGNOSIS — I482 Chronic atrial fibrillation: Secondary | ICD-10-CM | POA: Diagnosis not present

## 2016-04-05 DIAGNOSIS — E1165 Type 2 diabetes mellitus with hyperglycemia: Secondary | ICD-10-CM | POA: Diagnosis not present

## 2016-04-05 DIAGNOSIS — N183 Chronic kidney disease, stage 3 (moderate): Secondary | ICD-10-CM | POA: Diagnosis not present

## 2016-04-05 DIAGNOSIS — G4733 Obstructive sleep apnea (adult) (pediatric): Secondary | ICD-10-CM | POA: Diagnosis not present

## 2016-04-05 DIAGNOSIS — R1010 Upper abdominal pain, unspecified: Secondary | ICD-10-CM | POA: Diagnosis not present

## 2016-04-05 DIAGNOSIS — Z7901 Long term (current) use of anticoagulants: Secondary | ICD-10-CM | POA: Diagnosis not present

## 2016-05-03 DIAGNOSIS — Z0001 Encounter for general adult medical examination with abnormal findings: Secondary | ICD-10-CM | POA: Diagnosis not present

## 2016-05-03 DIAGNOSIS — N183 Chronic kidney disease, stage 3 (moderate): Secondary | ICD-10-CM | POA: Diagnosis not present

## 2016-05-03 DIAGNOSIS — E782 Mixed hyperlipidemia: Secondary | ICD-10-CM | POA: Diagnosis not present

## 2016-05-03 DIAGNOSIS — E1165 Type 2 diabetes mellitus with hyperglycemia: Secondary | ICD-10-CM | POA: Diagnosis not present

## 2016-05-12 DIAGNOSIS — K210 Gastro-esophageal reflux disease with esophagitis: Secondary | ICD-10-CM | POA: Diagnosis not present

## 2016-05-12 DIAGNOSIS — K5900 Constipation, unspecified: Secondary | ICD-10-CM | POA: Diagnosis not present

## 2016-05-12 DIAGNOSIS — I482 Chronic atrial fibrillation: Secondary | ICD-10-CM | POA: Diagnosis not present

## 2016-05-12 DIAGNOSIS — Z7901 Long term (current) use of anticoagulants: Secondary | ICD-10-CM | POA: Diagnosis not present

## 2016-05-12 DIAGNOSIS — E1165 Type 2 diabetes mellitus with hyperglycemia: Secondary | ICD-10-CM | POA: Diagnosis not present

## 2016-05-12 DIAGNOSIS — R1010 Upper abdominal pain, unspecified: Secondary | ICD-10-CM | POA: Diagnosis not present

## 2016-06-07 DIAGNOSIS — I482 Chronic atrial fibrillation: Secondary | ICD-10-CM | POA: Diagnosis not present

## 2016-06-07 DIAGNOSIS — Z7901 Long term (current) use of anticoagulants: Secondary | ICD-10-CM | POA: Diagnosis not present

## 2016-07-05 DIAGNOSIS — Z7901 Long term (current) use of anticoagulants: Secondary | ICD-10-CM | POA: Diagnosis not present

## 2016-07-05 DIAGNOSIS — I482 Chronic atrial fibrillation: Secondary | ICD-10-CM | POA: Diagnosis not present

## 2016-07-11 ENCOUNTER — Other Ambulatory Visit: Payer: Self-pay | Admitting: Physician Assistant

## 2016-07-11 DIAGNOSIS — K2100 Gastro-esophageal reflux disease with esophagitis, without bleeding: Secondary | ICD-10-CM

## 2016-07-11 DIAGNOSIS — R1011 Right upper quadrant pain: Secondary | ICD-10-CM

## 2016-07-11 DIAGNOSIS — G8929 Other chronic pain: Secondary | ICD-10-CM

## 2016-07-13 ENCOUNTER — Other Ambulatory Visit: Payer: Self-pay | Admitting: Physician Assistant

## 2016-07-25 ENCOUNTER — Ambulatory Visit
Admission: RE | Admit: 2016-07-25 | Discharge: 2016-07-25 | Disposition: A | Discharge status: Home / Self Care | Payer: Commercial Managed Care - HMO | Source: Ambulatory Visit | Attending: Physician Assistant | Admitting: Physician Assistant

## 2016-07-25 DIAGNOSIS — K449 Diaphragmatic hernia without obstruction or gangrene: Secondary | ICD-10-CM | POA: Insufficient documentation

## 2016-07-25 DIAGNOSIS — K219 Gastro-esophageal reflux disease without esophagitis: Secondary | ICD-10-CM | POA: Diagnosis not present

## 2016-07-25 DIAGNOSIS — K2100 Gastro-esophageal reflux disease with esophagitis, without bleeding: Secondary | ICD-10-CM

## 2016-07-25 DIAGNOSIS — G8929 Other chronic pain: Secondary | ICD-10-CM

## 2016-07-25 DIAGNOSIS — R1011 Right upper quadrant pain: Secondary | ICD-10-CM

## 2016-08-02 DIAGNOSIS — Z7901 Long term (current) use of anticoagulants: Secondary | ICD-10-CM | POA: Diagnosis not present

## 2016-08-02 DIAGNOSIS — I482 Chronic atrial fibrillation: Secondary | ICD-10-CM | POA: Diagnosis not present

## 2016-08-23 DIAGNOSIS — E1142 Type 2 diabetes mellitus with diabetic polyneuropathy: Secondary | ICD-10-CM | POA: Diagnosis not present

## 2016-08-23 DIAGNOSIS — Z7901 Long term (current) use of anticoagulants: Secondary | ICD-10-CM | POA: Diagnosis not present

## 2016-08-23 DIAGNOSIS — Z0001 Encounter for general adult medical examination with abnormal findings: Secondary | ICD-10-CM | POA: Diagnosis not present

## 2016-08-23 DIAGNOSIS — I482 Chronic atrial fibrillation: Secondary | ICD-10-CM | POA: Diagnosis not present

## 2016-08-23 DIAGNOSIS — I130 Hypertensive heart and chronic kidney disease with heart failure and stage 1 through stage 4 chronic kidney disease, or unspecified chronic kidney disease: Secondary | ICD-10-CM | POA: Diagnosis not present

## 2016-08-23 DIAGNOSIS — I10 Essential (primary) hypertension: Secondary | ICD-10-CM | POA: Diagnosis not present

## 2016-08-23 DIAGNOSIS — K210 Gastro-esophageal reflux disease with esophagitis: Secondary | ICD-10-CM | POA: Diagnosis not present

## 2016-08-23 DIAGNOSIS — Z23 Encounter for immunization: Secondary | ICD-10-CM | POA: Diagnosis not present

## 2016-08-30 DIAGNOSIS — H353134 Nonexudative age-related macular degeneration, bilateral, advanced atrophic with subfoveal involvement: Secondary | ICD-10-CM | POA: Diagnosis not present

## 2016-09-06 DIAGNOSIS — Z1231 Encounter for screening mammogram for malignant neoplasm of breast: Secondary | ICD-10-CM | POA: Diagnosis not present

## 2016-09-06 DIAGNOSIS — K219 Gastro-esophageal reflux disease without esophagitis: Secondary | ICD-10-CM | POA: Diagnosis not present

## 2016-09-06 DIAGNOSIS — M8588 Other specified disorders of bone density and structure, other site: Secondary | ICD-10-CM | POA: Diagnosis not present

## 2016-09-06 DIAGNOSIS — E2839 Other primary ovarian failure: Secondary | ICD-10-CM | POA: Diagnosis not present

## 2016-11-20 DIAGNOSIS — Z7901 Long term (current) use of anticoagulants: Secondary | ICD-10-CM | POA: Diagnosis not present

## 2016-11-20 DIAGNOSIS — I10 Essential (primary) hypertension: Secondary | ICD-10-CM | POA: Diagnosis not present

## 2016-11-20 DIAGNOSIS — I482 Chronic atrial fibrillation: Secondary | ICD-10-CM | POA: Diagnosis not present

## 2016-11-20 DIAGNOSIS — E1165 Type 2 diabetes mellitus with hyperglycemia: Secondary | ICD-10-CM | POA: Diagnosis not present

## 2016-11-20 DIAGNOSIS — N183 Chronic kidney disease, stage 3 (moderate): Secondary | ICD-10-CM | POA: Diagnosis not present

## 2016-11-26 ENCOUNTER — Emergency Department: Payer: Medicare HMO

## 2016-11-26 ENCOUNTER — Emergency Department
Admission: EM | Admit: 2016-11-26 | Discharge: 2016-11-26 | Disposition: A | Discharge status: Home / Self Care | Payer: Medicare HMO | Attending: Emergency Medicine | Admitting: Emergency Medicine

## 2016-11-26 ENCOUNTER — Encounter: Payer: Self-pay | Admitting: Emergency Medicine

## 2016-11-26 DIAGNOSIS — R202 Paresthesia of skin: Secondary | ICD-10-CM | POA: Diagnosis not present

## 2016-11-26 DIAGNOSIS — Z7901 Long term (current) use of anticoagulants: Secondary | ICD-10-CM | POA: Diagnosis not present

## 2016-11-26 DIAGNOSIS — Z794 Long term (current) use of insulin: Secondary | ICD-10-CM | POA: Insufficient documentation

## 2016-11-26 DIAGNOSIS — F17210 Nicotine dependence, cigarettes, uncomplicated: Secondary | ICD-10-CM | POA: Insufficient documentation

## 2016-11-26 DIAGNOSIS — Z79899 Other long term (current) drug therapy: Secondary | ICD-10-CM | POA: Insufficient documentation

## 2016-11-26 DIAGNOSIS — E1122 Type 2 diabetes mellitus with diabetic chronic kidney disease: Secondary | ICD-10-CM | POA: Insufficient documentation

## 2016-11-26 DIAGNOSIS — I130 Hypertensive heart and chronic kidney disease with heart failure and stage 1 through stage 4 chronic kidney disease, or unspecified chronic kidney disease: Secondary | ICD-10-CM | POA: Insufficient documentation

## 2016-11-26 DIAGNOSIS — R200 Anesthesia of skin: Secondary | ICD-10-CM

## 2016-11-26 DIAGNOSIS — I509 Heart failure, unspecified: Secondary | ICD-10-CM | POA: Diagnosis not present

## 2016-11-26 DIAGNOSIS — N189 Chronic kidney disease, unspecified: Secondary | ICD-10-CM | POA: Diagnosis not present

## 2016-11-26 LAB — CBC WITH DIFFERENTIAL/PLATELET
BASOS ABS: 0.1 10*3/uL (ref 0–0.1)
Basophils Relative: 1 %
Eosinophils Absolute: 0.2 10*3/uL (ref 0–0.7)
Eosinophils Relative: 2 %
HEMATOCRIT: 40.9 % (ref 35.0–47.0)
Hemoglobin: 13.9 g/dL (ref 12.0–16.0)
Lymphocytes Relative: 32 %
Lymphs Abs: 2.9 10*3/uL (ref 1.0–3.6)
MCH: 32 pg (ref 26.0–34.0)
MCHC: 33.9 g/dL (ref 32.0–36.0)
MCV: 94.6 fL (ref 80.0–100.0)
Monocytes Absolute: 0.5 10*3/uL (ref 0.2–0.9)
Monocytes Relative: 6 %
NEUTROS ABS: 5.4 10*3/uL (ref 1.4–6.5)
NEUTROS PCT: 59 %
PLATELETS: 185 10*3/uL (ref 150–440)
RBC: 4.33 MIL/uL (ref 3.80–5.20)
RDW: 13.6 % (ref 11.5–14.5)
WBC: 9.2 10*3/uL (ref 3.6–11.0)

## 2016-11-26 LAB — COMPREHENSIVE METABOLIC PANEL
ALT: 12 U/L — AB (ref 14–54)
AST: 21 U/L (ref 15–41)
Albumin: 3.2 g/dL — ABNORMAL LOW (ref 3.5–5.0)
Alkaline Phosphatase: 60 U/L (ref 38–126)
Anion gap: 6 (ref 5–15)
BUN: 18 mg/dL (ref 6–20)
CHLORIDE: 101 mmol/L (ref 101–111)
CO2: 28 mmol/L (ref 22–32)
CREATININE: 1.25 mg/dL — AB (ref 0.44–1.00)
Calcium: 8.7 mg/dL — ABNORMAL LOW (ref 8.9–10.3)
GFR calc Af Amer: 48 mL/min — ABNORMAL LOW (ref >60)
GFR, EST NON AFRICAN AMERICAN: 41 mL/min — AB (ref >60)
Glucose, Bld: 253 mg/dL — ABNORMAL HIGH (ref 65–99)
Potassium: 3.8 mmol/L (ref 3.5–5.1)
Sodium: 135 mmol/L (ref 135–145)
Total Bilirubin: 0.4 mg/dL (ref 0.3–1.2)
Total Protein: 6.8 g/dL (ref 6.5–8.1)

## 2016-11-26 LAB — TROPONIN I

## 2016-11-26 LAB — GLUCOSE, CAPILLARY: GLUCOSE-CAPILLARY: 171 mg/dL — AB (ref 65–99)

## 2016-11-26 LAB — PROTIME-INR
INR: 2.91
PROTHROMBIN TIME: 31 s — AB (ref 11.4–15.2)

## 2016-11-26 MED ORDER — ASPIRIN 81 MG PO CHEW
324.0000 mg | CHEWABLE_TABLET | Freq: Once | ORAL | Status: AC
  Administered 2016-11-26: 324 mg via ORAL
  Filled 2016-11-26: qty 4

## 2016-11-26 NOTE — ED Notes (Addendum)
Pt stating right sided face numbness that started around 1130 last night. Pt stating that she noticed numbness on the anterior to posterior portion of her right top of head. Pt stating no HA , dizziness, or lightheadedness. Pt's daughter was concerned and had pt come in to be assessed. Pt in NAD and denies pain.

## 2016-11-26 NOTE — ED Provider Notes (Signed)
[facility] Emergency Department Provider Note  ____________________________________________  Time seen: Approximately 6:12 PM  I have reviewed the triage vital signs and the nursing notes.   HISTORY  Chief Complaint Numbness   HPI [name] is a 76 y.o. female the history of CK D, diabetes, atrial fibrillation on Coumadin, hypertension, CHF who presents for evaluation of right facial numbness. Patient reports that she noticed yesterday at 11:30 PM that the right side of her face was numb. She went to bed and this morning the numbness persisted. This afternoon she told her daughter who brought her here for evaluation. Patient denies slurred speech, difficulty finding words, changes in vision, weakness or numbness of her extremities, difficulty walking. No prior history of strokes. No family history of stroke. Patient is a smoker. She endorses compliance with her Coumadin. No headaches, no chest pain, no back pain, no abdominal pain, no dizziness. No fever or chills.  History reviewed. No pertinent past medical history.  There are no active problems to display for this patient.   History reviewed. No pertinent surgical history.  Prior to Admission medications   Medication Sig Start Date End Date Taking? Authorizing Provider  atorvastatin (LIPITOR) 10 MG tablet Take 10 mg by mouth every evening. 01/25/15  Yes Historical Provider, MD  citalopram (CELEXA) 40 MG tablet Take 40 mg by mouth daily. 02/23/15  Yes Historical Provider, MD  digoxin (LANOXIN) 0.125 MG tablet Take 125 mcg by mouth daily. 11/03/16  Yes Historical Provider, MD  furosemide (LASIX) 20 MG tablet Take 20 mg by mouth daily. 09/27/16  Yes Historical Provider, MD  Insulin Glargine (TOUJEO SOLOSTAR) 300 UNIT/ML SOPN Inject 20 mg into the skin daily.   Yes Historical Provider, MD  liraglutide (VICTOZA) 18 MG/3ML SOPN Inject 1.2 mg into the skin daily.   Yes Historical Provider, MD  pantoprazole  (PROTONIX) 40 MG tablet Take 40 mg by mouth 2 (two) times daily. 11/17/16  Yes Historical Provider, MD  rOPINIRole (REQUIP) 0.5 MG tablet Take 0.5 mg by mouth every evening. 09/27/16  Yes Historical Provider, MD  warfarin (COUMADIN) 4 MG tablet Take 4 mg by mouth See admin instructions. tk 1.5 tabs on Tuesday and Friday, and 1 tablet on the rest of the days 09/27/16  Yes Historical Provider, MD    Allergies Patient has no known allergies.  Family History  Problem Relation Age of Onset  . Breast cancer Mother     Social History Social History  Substance Use Topics  . Smoking status: Current Every Day Smoker    Packs/day: 1.00    Types: Cigarettes  . Smokeless tobacco: Never Used  . Alcohol use No    Review of Systems  Constitutional: Negative for fever. Eyes: Negative for visual changes. ENT: Negative for sore throat. Neck: No neck pain  Cardiovascular: Negative for chest pain. Respiratory: Negative for shortness of breath. Gastrointestinal: Negative for abdominal pain, vomiting or diarrhea. Genitourinary: Negative for dysuria. Musculoskeletal: Negative for back pain. Skin: Negative for rash. Neurological: Negative for headaches. + R facial droop Psych: No SI or HI  ____________________________________________   PHYSICAL EXAM:  VITAL SIGNS: ED Triage Vitals  Enc Vitals Group     BP 11/26/16 1704 93/62     Pulse Rate 11/26/16 1704 72     Resp --      Temp 11/26/16 1704 98.9 F (37.2 C)     Temp Source 11/26/16 1704 Oral     SpO2 11/26/16 1704 98 %  Weight 11/26/16 1705 149 lb (67.6 kg)     Height 11/26/16 1705 5' (1.524 m)     Head Circumference --      Peak Flow --      Pain Score --      Pain Loc --      Pain Edu? --      Excl. in [place]? --     Constitutional: Alert and oriented. Well appearing and in no apparent distress. HEENT:      Head: Normocephalic and atraumatic.         Eyes: Conjunctivae are normal. Sclera is non-icteric. EOMI. PERRL       Mouth/Throat: Mucous membranes are moist.       Neck: Supple with no signs of meningismus. Cardiovascular: Regular rate and rhythm. No murmurs, gallops, or rubs. 2+ symmetrical distal pulses are present in all extremities. No JVD. Respiratory: Normal respiratory effort. Lungs are clear to auscultation bilaterally. No wheezes, crackles, or rhonchi.  Gastrointestinal: Soft, non tender, and non distended with positive bowel sounds. No rebound or guarding. Musculoskeletal: Nontender with normal range of motion in all extremities. No edema, cyanosis, or erythema of extremities. Neurologic: Normal speech and language. A & O x3, PERRL, no nystagmus, CN II-XII intact, motor testing reveals good tone and bulk throughout. There is no evidence of pronator drift or dysmetria. Muscle strength is 5/5 throughout. Deep tendon reflexes are 2+ throughout with downgoing toes. Decreased sensation to the R side of her face with extinction over the area of CN-V1, otherwise sensory examination is intact. Gait is normal. Skin: Skin is warm, dry and intact. No rash noted. Psychiatric: Mood and affect are normal. Speech and behavior are normal.  ____________________________________________   LABS (all labs ordered are listed, but only abnormal results are displayed)  Labs Reviewed  COMPREHENSIVE METABOLIC PANEL - Abnormal; Notable for the following:       Result Value   Glucose, Bld 253 (*)    Creatinine, Ser 1.25 (*)    Calcium 8.7 (*)    Albumin 3.2 (*)    ALT 12 (*)    GFR calc non Af Amer 41 (*)    GFR calc Af Amer 48 (*)    All other components within normal limits  PROTIME-INR - Abnormal; Notable for the following:    Prothrombin Time 31.0 (*)    All other components within normal limits  GLUCOSE, CAPILLARY - Abnormal; Notable for the following:    Glucose-Capillary 171 (*)    All other components within normal limits  CBC WITH DIFFERENTIAL/PLATELET  TROPONIN I  CBG MONITORING, ED    ____________________________________________  EKG  ED ECG REPORT I, Rudene Re, the attending physician, personally viewed and interpreted this ECG.  Atrial fibrillation, rate of 72, normal QRS and QTc intervals, normal axis, flattening T waves diffusely, T-wave inversions in 3 and aVL, no ST elevations. ____________________________________________  RADIOLOGY  Head CT: Negative  MRI brain: Negative ____________________________________________   PROCEDURES  Procedure(s) performed: None Procedures Critical Care performed:  None ____________________________________________   INITIAL IMPRESSION / ASSESSMENT AND PLAN / ED COURSE   76 y.o. female the history of CK D, diabetes, atrial fibrillation on Coumadin, hypertension, CHF who presents for evaluation of right facial numbness since yesterday 11:30 PM. Patient has extinction over the distribution of the right CN V1, decreased sensation on the right side of the face with no extinction on the region of V2 and V3, otherwise neuro intact. Will get head CT, check CBG.  Clinical Course as of Nov 26 2236  Nancy Fetter Nov 26, 2016  2043 CT is negative. INR is therapeutic at 2.91. I discussed exam findings and results of CT scan with Dr. Irish Elders, Neurology on call who recommended getting a MRI and if negative DC otherwise admit for stroke eval.   [CV]  2231 MRI negative for stroke in the setting of persistent symptoms. Patient be discharged home.  [CV]    Clinical Course User Index [CV] Rudene Re, MD    Pertinent labs & imaging results that were available during my care of the patient were reviewed by me and considered in my medical decision making (see chart for details).    ____________________________________________   FINAL CLINICAL IMPRESSION(S) / ED DIAGNOSES  Final diagnoses:  Right facial numbness      NEW MEDICATIONS STARTED DURING THIS VISIT:  New Prescriptions   No medications on file     Note:   This document was prepared using Dragon voice recognition software and may include unintentional dictation errors.    Rudene Re, MD 11/26/16 2238

## 2016-11-26 NOTE — ED Notes (Signed)
MRI has been contacted for pt's scan.

## 2016-11-26 NOTE — ED Notes (Signed)
Pt states she went to bed at 930pm last night. Woke up at 1130pm and reports numbness to the right side of her face and head started then.  Numbness has continued since.

## 2016-11-26 NOTE — ED Notes (Signed)
Pt in with pt. Pt in NAD and denying any changes. Pt stating that she still is feeling numbness on the top of her head. Nurse told pt and pt's family that she would let Dr. Alfred Levins know that pt's CT resulted so we can figure out her next steps in her care.

## 2016-11-26 NOTE — ED Triage Notes (Signed)
Pt states woke up last night at 1130 and noticed to R side of her head was numb. Pt states she then went back to sleep, and presents to the ED with her daughter. Grip strengths equal bilaterally, facial symmetry intact at this time, pt is alert and oriented at this time.

## 2016-12-20 DIAGNOSIS — E119 Type 2 diabetes mellitus without complications: Secondary | ICD-10-CM | POA: Diagnosis not present

## 2016-12-20 DIAGNOSIS — H353134 Nonexudative age-related macular degeneration, bilateral, advanced atrophic with subfoveal involvement: Secondary | ICD-10-CM | POA: Diagnosis not present

## 2016-12-26 DIAGNOSIS — F17210 Nicotine dependence, cigarettes, uncomplicated: Secondary | ICD-10-CM | POA: Diagnosis not present

## 2016-12-26 DIAGNOSIS — N183 Chronic kidney disease, stage 3 (moderate): Secondary | ICD-10-CM | POA: Diagnosis not present

## 2016-12-26 DIAGNOSIS — M549 Dorsalgia, unspecified: Secondary | ICD-10-CM | POA: Diagnosis not present

## 2016-12-26 DIAGNOSIS — E1165 Type 2 diabetes mellitus with hyperglycemia: Secondary | ICD-10-CM | POA: Diagnosis not present

## 2016-12-26 DIAGNOSIS — I482 Chronic atrial fibrillation: Secondary | ICD-10-CM | POA: Diagnosis not present

## 2016-12-26 DIAGNOSIS — Z7901 Long term (current) use of anticoagulants: Secondary | ICD-10-CM | POA: Diagnosis not present

## 2016-12-26 DIAGNOSIS — I10 Essential (primary) hypertension: Secondary | ICD-10-CM | POA: Diagnosis not present

## 2017-01-31 DIAGNOSIS — H43813 Vitreous degeneration, bilateral: Secondary | ICD-10-CM | POA: Diagnosis not present

## 2017-01-31 DIAGNOSIS — H35423 Microcystoid degeneration of retina, bilateral: Secondary | ICD-10-CM | POA: Diagnosis not present

## 2017-01-31 DIAGNOSIS — H353134 Nonexudative age-related macular degeneration, bilateral, advanced atrophic with subfoveal involvement: Secondary | ICD-10-CM | POA: Diagnosis not present

## 2017-02-01 DIAGNOSIS — M549 Dorsalgia, unspecified: Secondary | ICD-10-CM | POA: Diagnosis not present

## 2017-02-01 DIAGNOSIS — G309 Alzheimer's disease, unspecified: Secondary | ICD-10-CM | POA: Diagnosis not present

## 2017-02-01 DIAGNOSIS — I10 Essential (primary) hypertension: Secondary | ICD-10-CM | POA: Diagnosis not present

## 2017-02-01 DIAGNOSIS — I482 Chronic atrial fibrillation: Secondary | ICD-10-CM | POA: Diagnosis not present

## 2017-02-01 DIAGNOSIS — Z7901 Long term (current) use of anticoagulants: Secondary | ICD-10-CM | POA: Diagnosis not present

## 2017-02-01 DIAGNOSIS — E1165 Type 2 diabetes mellitus with hyperglycemia: Secondary | ICD-10-CM | POA: Diagnosis not present

## 2017-02-20 DIAGNOSIS — M545 Low back pain, unspecified: Secondary | ICD-10-CM | POA: Insufficient documentation

## 2017-02-20 DIAGNOSIS — M5136 Other intervertebral disc degeneration, lumbar region: Secondary | ICD-10-CM | POA: Insufficient documentation

## 2017-06-12 DIAGNOSIS — I482 Chronic atrial fibrillation: Secondary | ICD-10-CM | POA: Diagnosis not present

## 2017-06-12 DIAGNOSIS — G309 Alzheimer's disease, unspecified: Secondary | ICD-10-CM | POA: Diagnosis not present

## 2017-06-12 DIAGNOSIS — E1165 Type 2 diabetes mellitus with hyperglycemia: Secondary | ICD-10-CM | POA: Diagnosis not present

## 2017-06-12 DIAGNOSIS — Z7901 Long term (current) use of anticoagulants: Secondary | ICD-10-CM | POA: Diagnosis not present

## 2017-06-12 DIAGNOSIS — I10 Essential (primary) hypertension: Secondary | ICD-10-CM | POA: Diagnosis not present

## 2017-06-12 DIAGNOSIS — F17210 Nicotine dependence, cigarettes, uncomplicated: Secondary | ICD-10-CM | POA: Diagnosis not present

## 2017-06-12 DIAGNOSIS — J449 Chronic obstructive pulmonary disease, unspecified: Secondary | ICD-10-CM | POA: Diagnosis not present

## 2017-08-01 DIAGNOSIS — H353132 Nonexudative age-related macular degeneration, bilateral, intermediate dry stage: Secondary | ICD-10-CM | POA: Diagnosis not present

## 2017-08-01 DIAGNOSIS — H543 Unqualified visual loss, both eyes: Secondary | ICD-10-CM | POA: Diagnosis not present

## 2017-09-18 DIAGNOSIS — Z634 Disappearance and death of family member: Secondary | ICD-10-CM | POA: Diagnosis not present

## 2017-09-18 DIAGNOSIS — Z7901 Long term (current) use of anticoagulants: Secondary | ICD-10-CM | POA: Diagnosis not present

## 2017-09-18 DIAGNOSIS — F17210 Nicotine dependence, cigarettes, uncomplicated: Secondary | ICD-10-CM | POA: Diagnosis not present

## 2017-09-18 DIAGNOSIS — F4323 Adjustment disorder with mixed anxiety and depressed mood: Secondary | ICD-10-CM | POA: Diagnosis not present

## 2017-09-18 DIAGNOSIS — I482 Chronic atrial fibrillation: Secondary | ICD-10-CM | POA: Diagnosis not present

## 2017-10-09 ENCOUNTER — Other Ambulatory Visit: Payer: Self-pay

## 2017-10-09 ENCOUNTER — Other Ambulatory Visit: Payer: Self-pay | Admitting: Internal Medicine

## 2017-10-09 MED ORDER — TRAMADOL HCL 50 MG PO TABS: 50.0000 mg | ORAL_TABLET | Freq: Two times a day (BID) | ORAL | 3 refills | Status: DC

## 2017-10-09 NOTE — Progress Notes (Signed)
Refilled tramadol.

## 2017-10-10 ENCOUNTER — Encounter: Payer: Self-pay | Admitting: Nurse Practitioner

## 2017-10-10 ENCOUNTER — Other Ambulatory Visit: Payer: Self-pay | Admitting: Internal Medicine

## 2017-10-10 MED ORDER — TRAMADOL HCL 50 MG PO TABS: 50.0000 mg | ORAL_TABLET | Freq: Two times a day (BID) | ORAL | 3 refills | Status: DC

## 2017-10-30 ENCOUNTER — Other Ambulatory Visit: Payer: Self-pay

## 2017-10-30 MED ORDER — GLUCOSE BLOOD VI STRP: ORAL_STRIP | 5 refills | Status: DC

## 2017-10-31 ENCOUNTER — Other Ambulatory Visit: Payer: Self-pay | Admitting: Nurse Practitioner

## 2017-10-31 ENCOUNTER — Other Ambulatory Visit: Payer: Self-pay

## 2017-10-31 DIAGNOSIS — I10 Essential (primary) hypertension: Secondary | ICD-10-CM | POA: Diagnosis not present

## 2017-10-31 DIAGNOSIS — E782 Mixed hyperlipidemia: Secondary | ICD-10-CM | POA: Diagnosis not present

## 2017-10-31 DIAGNOSIS — E119 Type 2 diabetes mellitus without complications: Secondary | ICD-10-CM | POA: Diagnosis not present

## 2017-10-31 DIAGNOSIS — Z0001 Encounter for general adult medical examination with abnormal findings: Secondary | ICD-10-CM | POA: Diagnosis not present

## 2017-10-31 DIAGNOSIS — E559 Vitamin D deficiency, unspecified: Secondary | ICD-10-CM | POA: Diagnosis not present

## 2017-10-31 MED ORDER — GLUCOSE BLOOD VI STRP: ORAL_STRIP | 5 refills | Status: DC

## 2017-11-09 LAB — T4, FREE: FREE T4: 1.16 ng/dL (ref 0.82–1.77)

## 2017-11-09 LAB — CBC
HEMATOCRIT: 41.9 % (ref 34.0–46.6)
Hemoglobin: 14.2 g/dL (ref 11.1–15.9)
MCH: 32.1 pg (ref 26.6–33.0)
MCHC: 33.9 g/dL (ref 31.5–35.7)
MCV: 95 fL (ref 79–97)
PLATELETS: 217 10*3/uL (ref 150–379)
RBC: 4.42 x10E6/uL (ref 3.77–5.28)
RDW: 14 % (ref 12.3–15.4)
WBC: 12 10*3/uL — ABNORMAL HIGH (ref 3.4–10.8)

## 2017-11-09 LAB — COMPREHENSIVE METABOLIC PANEL
ALBUMIN: 3.6 g/dL (ref 3.5–4.8)
ALT: 10 IU/L (ref 0–32)
AST: 17 IU/L (ref 0–40)
Albumin/Globulin Ratio: 1.2 (ref 1.2–2.2)
Alkaline Phosphatase: 75 IU/L (ref 39–117)
BUN / CREAT RATIO: 12 (ref 12–28)
BUN: 19 mg/dL (ref 8–27)
Bilirubin Total: 0.3 mg/dL (ref 0.0–1.2)
CO2: 27 mmol/L (ref 20–29)
CREATININE: 1.58 mg/dL — AB (ref 0.57–1.00)
Calcium: 9.7 mg/dL (ref 8.7–10.3)
Chloride: 100 mmol/L (ref 96–106)
GFR calc Af Amer: 36 mL/min/{1.73_m2} — ABNORMAL LOW (ref >59)
GFR calc non Af Amer: 32 mL/min/{1.73_m2} — ABNORMAL LOW (ref >59)
GLOBULIN, TOTAL: 3.1 g/dL (ref 1.5–4.5)
GLUCOSE: 183 mg/dL — AB (ref 65–99)
Potassium: 4.6 mmol/L (ref 3.5–5.2)
SODIUM: 138 mmol/L (ref 134–144)
TOTAL PROTEIN: 6.7 g/dL (ref 6.0–8.5)

## 2017-11-09 LAB — LIPID PANEL W/O CHOL/HDL RATIO
Cholesterol, Total: 101 mg/dL (ref 100–199)
HDL: 26 mg/dL — ABNORMAL LOW (ref >39)
LDL Calculated: 21 mg/dL (ref 0–99)
Triglycerides: 271 mg/dL — ABNORMAL HIGH (ref 0–149)
VLDL Cholesterol Cal: 54 mg/dL — ABNORMAL HIGH (ref 5–40)

## 2017-11-09 LAB — MICROALBUMIN, URINE: MICROALBUM., U, RANDOM: 119 ug/mL

## 2017-11-09 LAB — TSH: TSH: 3.06 u[IU]/mL (ref 0.450–4.500)

## 2017-11-09 LAB — VITAMIN D 25 HYDROXY (VIT D DEFICIENCY, FRACTURES): Vit D, 25-Hydroxy: 15.3 ng/mL — ABNORMAL LOW (ref 30.0–100.0)

## 2017-11-14 NOTE — Progress Notes (Signed)
Let pt know as per Leretha Pol that her kidney function are getting a bit worse and told Titania to set up a renal ultrasound with renal arteries for her.

## 2017-11-19 ENCOUNTER — Other Ambulatory Visit: Payer: Medicare HMO

## 2017-11-19 DIAGNOSIS — R944 Abnormal results of kidney function studies: Secondary | ICD-10-CM | POA: Diagnosis not present

## 2017-11-28 ENCOUNTER — Other Ambulatory Visit: Payer: Self-pay | Admitting: Nurse Practitioner

## 2017-11-28 DIAGNOSIS — R944 Abnormal results of kidney function studies: Secondary | ICD-10-CM

## 2017-12-05 ENCOUNTER — Other Ambulatory Visit: Payer: Self-pay

## 2017-12-05 MED ORDER — CITALOPRAM HYDROBROMIDE 40 MG PO TABS: 40.0000 mg | ORAL_TABLET | Freq: Every day | ORAL | 1 refills | Status: DC

## 2017-12-05 MED ORDER — ROPINIROLE HCL 0.5 MG PO TABS: 0.5000 mg | ORAL_TABLET | Freq: Every evening | ORAL | 1 refills | Status: DC

## 2017-12-05 MED ORDER — ATORVASTATIN CALCIUM 10 MG PO TABS: 10.0000 mg | ORAL_TABLET | Freq: Every evening | ORAL | 1 refills | Status: DC

## 2017-12-13 ENCOUNTER — Other Ambulatory Visit: Payer: Self-pay

## 2017-12-13 MED ORDER — LIRAGLUTIDE 18 MG/3ML ~~LOC~~ SOPN: 1.2000 mg | PEN_INJECTOR | Freq: Every day | SUBCUTANEOUS | 3 refills | Status: DC

## 2017-12-27 ENCOUNTER — Encounter: Payer: Self-pay | Admitting: Nurse Practitioner

## 2017-12-27 ENCOUNTER — Ambulatory Visit (INDEPENDENT_AMBULATORY_CARE_PROVIDER_SITE_OTHER): Payer: Medicare HMO | Admitting: Nurse Practitioner

## 2017-12-27 VITALS — BP 140/80 | HR 68 | Resp 16 | Ht 60.0 in | Wt 139.0 lb

## 2017-12-27 DIAGNOSIS — I4891 Unspecified atrial fibrillation: Secondary | ICD-10-CM | POA: Diagnosis not present

## 2017-12-27 DIAGNOSIS — E1165 Type 2 diabetes mellitus with hyperglycemia: Secondary | ICD-10-CM | POA: Diagnosis not present

## 2017-12-27 DIAGNOSIS — Z5181 Encounter for therapeutic drug level monitoring: Secondary | ICD-10-CM | POA: Diagnosis not present

## 2017-12-27 DIAGNOSIS — K219 Gastro-esophageal reflux disease without esophagitis: Secondary | ICD-10-CM

## 2017-12-27 DIAGNOSIS — M150 Primary generalized (osteo)arthritis: Secondary | ICD-10-CM

## 2017-12-27 DIAGNOSIS — I10 Essential (primary) hypertension: Secondary | ICD-10-CM | POA: Insufficient documentation

## 2017-12-27 LAB — POCT GLYCOSYLATED HEMOGLOBIN (HGB A1C): Hemoglobin A1C: 6.9

## 2017-12-27 LAB — POCT INR

## 2017-12-27 MED ORDER — TRAMADOL HCL 50 MG PO TABS: 50.0000 mg | ORAL_TABLET | Freq: Two times a day (BID) | ORAL | 2 refills | Status: DC

## 2017-12-27 NOTE — Progress Notes (Signed)
[facility] [place], [place] [address]  Internal MEDICINE  Office Visit Note  Patient Name: [name]  [id number]  [id number]  Date of Service: 12/27/2017  Chief Complaint  Patient presents with  . Diabetes  . Osteoarthritis    The patient is here for routine follow up exam. She does report generalized aches and pains. Unable to take NSAIDs due to chronic kidney disease. She will normally take tramadol 50mg , up to once daily when needed. This helps her pain.  Has had left nephrectomy. Recently had ultrasound of remaining kidney for continued management.  She is also concerned about acid reflux. Coughing up mucus, especially during the night. Takes pantoprazole every day.     Pt is here for routine follow up.    Current Medication: Outpatient Encounter Medications as of 12/27/2017  Medication Sig  . ACCU-CHEK FASTCLIX LANCETS MISC   . Alcohol Swabs (B-D SINGLE USE SWABS REGULAR) PADS   . atorvastatin (LIPITOR) 10 MG tablet Take 1 tablet (10 mg total) by mouth every evening.  . citalopram (CELEXA) 40 MG tablet Take 1 tablet (40 mg total) by mouth daily.  . digoxin (LANOXIN) 0.125 MG tablet Take 125 mcg by mouth daily.  Marland Kitchen donepezil (ARICEPT) 5 MG tablet   . furosemide (LASIX) 20 MG tablet Take 20 mg by mouth daily.  Marland Kitchen glucose blood (ACCU-CHEK GUIDE) test strip   . glucose blood test strip Use as instructed to check blood sugars twice a day.  E11.65  . Insulin Glargine (TOUJEO SOLOSTAR) 300 UNIT/ML SOPN Inject 20 mg into the skin daily.  . Insulin Pen Needle (BD PEN NEEDLE NANO U/F) 32G X 4 MM MISC   . liraglutide (VICTOZA) 18 MG/3ML SOPN Inject 0.2 mLs (1.2 mg total) into the skin daily.  . pantoprazole (PROTONIX) 40 MG tablet Take 40 mg by mouth 2 (two) times daily.  Marland Kitchen rOPINIRole (REQUIP) 0.5 MG tablet Take 1 tablet (0.5 mg total) by mouth every evening.  Marland Kitchen tiZANidine (ZANAFLEX) 2 MG tablet   . traMADol (ULTRAM) 50 MG tablet Take 1 tablet (50 mg  total) by mouth 2 (two) times daily.  Marland Kitchen warfarin (COUMADIN) 4 MG tablet Take 4 mg by mouth See admin instructions. tk 1.5 tabs on Tuesday and Friday, and 1 tablet on the rest of the days  . [DISCONTINUED] traMADol (ULTRAM) 50 MG tablet Take 1 tablet (50 mg total) by mouth 2 (two) times daily.   No facility-administered encounter medications on file as of 12/27/2017.     Surgical History: Past Surgical History:  Procedure Laterality Date  . ABDOMINAL HYSTERECTOMY    . APPENDECTOMY    . CATARACT EXTRACTION    . CHOLECYSTECTOMY    . HERNIA REPAIR    . right knee replacement    . right nephroectomy      Medical History: Past Medical History:  Diagnosis Date  . Atrial fibrillation ([place])   . Congestive heart failure (CHF) ([place])   . Diabetes mellitus without complication ([place])   . GERD (gastroesophageal reflux disease)   . Hyperlipidemia   . Hypertension     Family History: Family History  Problem Relation Age of Onset  . Breast cancer Mother     Social History   Socioeconomic History  . Marital status: Married    Spouse name: Not on file  . Number of children: Not on file  . Years of education: Not on file  . Highest education level: Not on file  Social  Needs  . Financial resource strain: Not on file  . Food insecurity - worry: Not on file  . Food insecurity - inability: Not on file  . Transportation needs - medical: Not on file  . Transportation needs - non-medical: Not on file  Occupational History  . Not on file  Tobacco Use  . Smoking status: Current Every Day Smoker    Packs/day: 1.00    Types: Cigarettes  . Smokeless tobacco: Never Used  Substance and Sexual Activity  . Alcohol use: No  . Drug use: No  . Sexual activity: Not on file  Other Topics Concern  . Not on file  Social History Narrative  . Not on file      Review of Systems  Constitutional: Negative for activity change, chills, fatigue and unexpected weight change.  HENT: Negative for  congestion, postnasal drip, rhinorrhea, sneezing and sore throat.   Eyes: Negative.  Negative for redness.  Respiratory: Negative for cough, chest tightness, shortness of breath and wheezing.   Cardiovascular: Negative for chest pain and palpitations.  Gastrointestinal: Negative for abdominal pain, constipation, diarrhea, nausea and vomiting.  Endocrine:       Blood sugars doing well   Genitourinary: Negative for dysuria and frequency.  Musculoskeletal: Positive for arthralgias. Negative for back pain, joint swelling and neck pain.  Skin: Negative for rash.  Allergic/Immunologic: Negative for environmental allergies.  Neurological: Negative for tremors, numbness and headaches.  Hematological: Negative for adenopathy. Does not bruise/bleed easily.  Psychiatric/Behavioral: Negative for behavioral problems (Depression), sleep disturbance and suicidal ideas. The patient is not nervous/anxious.     Today's Vitals   12/27/17 0855  BP: 140/80  Pulse: 68  Resp: 16  SpO2: 98%  Weight: 139 lb (63 kg)  Height: 5' (1.524 m)    Physical Exam  Constitutional: She is oriented to person, place, and time. She appears well-developed and well-nourished. No distress.  HENT:  Head: Normocephalic and atraumatic.  Mouth/Throat: Oropharynx is clear and moist. No oropharyngeal exudate.  Eyes: EOM are normal. Pupils are equal, round, and reactive to light.  Neck: Normal range of motion. Neck supple. No JVD present. Carotid bruit is not present. No tracheal deviation present. No thyromegaly present.  Cardiovascular: Normal rate, regular rhythm and normal heart sounds. Exam reveals no gallop and no friction rub.  No murmur heard. Pulmonary/Chest: Effort normal and breath sounds normal. No respiratory distress. She has no wheezes. She has no rales. She exhibits no tenderness.  Abdominal: Soft. Bowel sounds are normal. There is no tenderness.  Musculoskeletal: Normal range of motion.  Generalized joint pain  with point tenderness present. No visible or palpable abnormalities noted at this time.   Lymphadenopathy:    She has no cervical adenopathy.  Neurological: She is alert and oriented to person, place, and time. No cranial nerve deficit.  Skin: Skin is warm and dry. She is not diaphoretic.  Psychiatric: She has a normal mood and affect. Her behavior is normal. Judgment and thought content normal.  Nursing note and vitals reviewed.   Assessment/Plan:  1. Uncontrolled type 2 diabetes mellitus with hyperglycemia (HCC) - POCT glycosylated hemoglobin (Hb A1C) 6.9 today. Continue diabetic medication as prescribed.   2. Atrial fibrillation, unspecified type ([place]) - POCT INR 2.8 today. Continue warfarin therapy as prescribed .  3. Essential hypertension Stable. Continue bp medication as prescribed.   4. Gastroesophageal reflux disease without esophagitis Recommended she use OTC antac at night to help alleviate symptoms.   5. Encounter for  therapeutic drug monitoring INR therapeutic today.   6. Primary generalized osteoarthritis -  Tramadol 50mg  may be taken daily if needed for pain. New rx sent to her pharmacy today.   General Counseling: Danie verbalizes understanding of the findings of todays visit and agrees with plan of treatment. I have discussed any further diagnostic evaluation that may be needed or ordered today. We also reviewed her medications today. she has been encouraged to call the office with any questions or concerns that should arise related to todays visit.    Orders Placed This Encounter  Procedures  . POCT glycosylated hemoglobin (Hb A1C)  . POCT INR    Meds ordered this encounter  Medications  . traMADol (ULTRAM) 50 MG tablet    Sig: Take 1 tablet (50 mg total) by mouth 2 (two) times daily.    Dispense:  45 tablet    Refill:  2    Order Specific Question:   Supervising Provider    Answer:   Lavera Guise [[id number]]    Time spent: 65 Minutes    Dr Lavera Guise Internal medicine

## 2017-12-28 DIAGNOSIS — I480 Paroxysmal atrial fibrillation: Secondary | ICD-10-CM | POA: Insufficient documentation

## 2017-12-28 DIAGNOSIS — Z5181 Encounter for therapeutic drug level monitoring: Secondary | ICD-10-CM

## 2017-12-28 DIAGNOSIS — E119 Type 2 diabetes mellitus without complications: Secondary | ICD-10-CM | POA: Insufficient documentation

## 2017-12-28 DIAGNOSIS — Z0001 Encounter for general adult medical examination with abnormal findings: Secondary | ICD-10-CM | POA: Insufficient documentation

## 2017-12-28 DIAGNOSIS — M150 Primary generalized (osteo)arthritis: Secondary | ICD-10-CM | POA: Insufficient documentation

## 2018-01-11 ENCOUNTER — Other Ambulatory Visit: Payer: Self-pay

## 2018-01-11 ENCOUNTER — Emergency Department: Payer: Medicare HMO

## 2018-01-11 ENCOUNTER — Encounter: Payer: Self-pay | Admitting: Emergency Medicine

## 2018-01-11 ENCOUNTER — Inpatient Hospital Stay
Admission: EM | Admit: 2018-01-11 | Discharge: 2018-01-15 | DRG: 872 | Disposition: A | Discharge status: Home Health | Payer: Medicare HMO | Attending: Internal Medicine | Admitting: Internal Medicine

## 2018-01-11 DIAGNOSIS — R1110 Vomiting, unspecified: Secondary | ICD-10-CM | POA: Diagnosis not present

## 2018-01-11 DIAGNOSIS — Z66 Do not resuscitate: Secondary | ICD-10-CM | POA: Diagnosis present

## 2018-01-11 DIAGNOSIS — Z7901 Long term (current) use of anticoagulants: Secondary | ICD-10-CM

## 2018-01-11 DIAGNOSIS — K219 Gastro-esophageal reflux disease without esophagitis: Secondary | ICD-10-CM | POA: Diagnosis not present

## 2018-01-11 DIAGNOSIS — Z96651 Presence of right artificial knee joint: Secondary | ICD-10-CM | POA: Diagnosis present

## 2018-01-11 DIAGNOSIS — E785 Hyperlipidemia, unspecified: Secondary | ICD-10-CM | POA: Diagnosis present

## 2018-01-11 DIAGNOSIS — E119 Type 2 diabetes mellitus without complications: Secondary | ICD-10-CM

## 2018-01-11 DIAGNOSIS — A419 Sepsis, unspecified organism: Principal | ICD-10-CM | POA: Diagnosis present

## 2018-01-11 DIAGNOSIS — N200 Calculus of kidney: Secondary | ICD-10-CM | POA: Diagnosis not present

## 2018-01-11 DIAGNOSIS — R112 Nausea with vomiting, unspecified: Secondary | ICD-10-CM | POA: Diagnosis not present

## 2018-01-11 DIAGNOSIS — I10 Essential (primary) hypertension: Secondary | ICD-10-CM | POA: Diagnosis present

## 2018-01-11 DIAGNOSIS — K529 Noninfective gastroenteritis and colitis, unspecified: Secondary | ICD-10-CM | POA: Diagnosis not present

## 2018-01-11 DIAGNOSIS — N183 Chronic kidney disease, stage 3 unspecified: Secondary | ICD-10-CM | POA: Diagnosis present

## 2018-01-11 DIAGNOSIS — A09 Infectious gastroenteritis and colitis, unspecified: Secondary | ICD-10-CM | POA: Diagnosis present

## 2018-01-11 DIAGNOSIS — R197 Diarrhea, unspecified: Secondary | ICD-10-CM | POA: Diagnosis not present

## 2018-01-11 DIAGNOSIS — F17210 Nicotine dependence, cigarettes, uncomplicated: Secondary | ICD-10-CM | POA: Diagnosis present

## 2018-01-11 DIAGNOSIS — N184 Chronic kidney disease, stage 4 (severe): Secondary | ICD-10-CM | POA: Diagnosis not present

## 2018-01-11 DIAGNOSIS — E1122 Type 2 diabetes mellitus with diabetic chronic kidney disease: Secondary | ICD-10-CM | POA: Diagnosis present

## 2018-01-11 DIAGNOSIS — I5042 Chronic combined systolic (congestive) and diastolic (congestive) heart failure: Secondary | ICD-10-CM | POA: Diagnosis not present

## 2018-01-11 DIAGNOSIS — I480 Paroxysmal atrial fibrillation: Secondary | ICD-10-CM | POA: Diagnosis not present

## 2018-01-11 DIAGNOSIS — I130 Hypertensive heart and chronic kidney disease with heart failure and stage 1 through stage 4 chronic kidney disease, or unspecified chronic kidney disease: Secondary | ICD-10-CM | POA: Diagnosis not present

## 2018-01-11 DIAGNOSIS — R1111 Vomiting without nausea: Secondary | ICD-10-CM | POA: Diagnosis not present

## 2018-01-11 DIAGNOSIS — Z79899 Other long term (current) drug therapy: Secondary | ICD-10-CM | POA: Diagnosis not present

## 2018-01-11 DIAGNOSIS — I509 Heart failure, unspecified: Secondary | ICD-10-CM | POA: Diagnosis not present

## 2018-01-11 DIAGNOSIS — Z794 Long term (current) use of insulin: Secondary | ICD-10-CM | POA: Diagnosis not present

## 2018-01-11 LAB — URINALYSIS, COMPLETE (UACMP) WITH MICROSCOPIC
BILIRUBIN URINE: NEGATIVE
Glucose, UA: NEGATIVE mg/dL
KETONES UR: NEGATIVE mg/dL
Leukocytes, UA: NEGATIVE
Nitrite: NEGATIVE
PROTEIN: 100 mg/dL — AB
Specific Gravity, Urine: 1.016 (ref 1.005–1.030)
pH: 5 (ref 5.0–8.0)

## 2018-01-11 LAB — CBC
HEMATOCRIT: 46.8 % (ref 35.0–47.0)
HEMOGLOBIN: 15.9 g/dL (ref 12.0–16.0)
MCH: 31.9 pg (ref 26.0–34.0)
MCHC: 34 g/dL (ref 32.0–36.0)
MCV: 93.9 fL (ref 80.0–100.0)
Platelets: 246 10*3/uL (ref 150–440)
RBC: 4.98 MIL/uL (ref 3.80–5.20)
RDW: 13.8 % (ref 11.5–14.5)
WBC: 25.2 10*3/uL — ABNORMAL HIGH (ref 3.6–11.0)

## 2018-01-11 LAB — COMPREHENSIVE METABOLIC PANEL
ALBUMIN: 3.8 g/dL (ref 3.5–5.0)
ALK PHOS: 91 U/L (ref 38–126)
ALT: 12 U/L — ABNORMAL LOW (ref 14–54)
ANION GAP: 14 (ref 5–15)
AST: 37 U/L (ref 15–41)
BUN: 22 mg/dL — ABNORMAL HIGH (ref 6–20)
CO2: 25 mmol/L (ref 22–32)
Calcium: 9.4 mg/dL (ref 8.9–10.3)
Chloride: 97 mmol/L — ABNORMAL LOW (ref 101–111)
Creatinine, Ser: 1.52 mg/dL — ABNORMAL HIGH (ref 0.44–1.00)
GFR calc Af Amer: 37 mL/min — ABNORMAL LOW (ref >60)
GFR calc non Af Amer: 32 mL/min — ABNORMAL LOW (ref >60)
GLUCOSE: 271 mg/dL — AB (ref 65–99)
POTASSIUM: 4.8 mmol/L (ref 3.5–5.1)
SODIUM: 136 mmol/L (ref 135–145)
Total Bilirubin: 1.3 mg/dL — ABNORMAL HIGH (ref 0.3–1.2)
Total Protein: 8.1 g/dL (ref 6.5–8.1)

## 2018-01-11 LAB — LIPASE, BLOOD: LIPASE: 58 U/L — AB (ref 11–51)

## 2018-01-11 LAB — LACTIC ACID, PLASMA: LACTIC ACID, VENOUS: 2 mmol/L — AB (ref 0.5–1.9)

## 2018-01-11 LAB — GLUCOSE, CAPILLARY: Glucose-Capillary: 264 mg/dL — ABNORMAL HIGH (ref 65–99)

## 2018-01-11 MED ORDER — PIPERACILLIN-TAZOBACTAM 3.375 G IVPB 30 MIN
3.3750 g | Freq: Once | INTRAVENOUS | Status: AC
  Administered 2018-01-11: 3.375 g via INTRAVENOUS
  Filled 2018-01-11: qty 50

## 2018-01-11 MED ORDER — PIPERACILLIN-TAZOBACTAM 3.375 G IVPB
3.3750 g | Freq: Three times a day (TID) | INTRAVENOUS | Status: DC
  Administered 2018-01-12 – 2018-01-14 (×8): 3.375 g via INTRAVENOUS
  Filled 2018-01-11 (×8): qty 50

## 2018-01-11 MED ORDER — ONDANSETRON HCL 4 MG/2ML IJ SOLN
4.0000 mg | Freq: Once | INTRAMUSCULAR | Status: AC
  Administered 2018-01-11: 4 mg via INTRAVENOUS
  Filled 2018-01-11: qty 2

## 2018-01-11 MED ORDER — ONDANSETRON HCL 4 MG/2ML IJ SOLN
4.0000 mg | Freq: Once | INTRAMUSCULAR | Status: AC | PRN
  Administered 2018-01-11: 4 mg via INTRAVENOUS
  Filled 2018-01-11: qty 2

## 2018-01-11 NOTE — Progress Notes (Signed)
Pharmacy Antibiotic Note  [name] is a 77 y.o. female admitted on 01/11/2018 with intra-abdominal infection.  Pharmacy has been consulted for Zosyn dosing.  Plan: Zosyn 3.375g IV q8h (4 hour infusion).  Height: 5' (152.4 cm) Weight: 137 lb (62.1 kg) IBW/kg (Calculated) : 45.5  Temp (24hrs), Avg:97.5 F (36.4 C), Min:97.5 F (36.4 C), Max:97.5 F (36.4 C)  Recent Labs  Lab 01/11/18 1521 01/11/18 2047  WBC 25.2*  --   CREATININE 1.52*  --   LATICACIDVEN  --  2.0*    Estimated Creatinine Clearance: 25.9 mL/min (A) (by C-G formula based on SCr of 1.52 mg/dL (H)).    No Known Allergies  Antimicrobials this admission: Zosyn 3/22  >>    >>   Dose adjustments this admission:   Microbiology results: 3/22  BCx: pending 3/22 UCx: pending       3/22 LE(-) NO2(-)  WBC 0-5 Thank you for allowing pharmacy to be a part of this patient's care.  Justene Jensen S 01/11/2018 10:07 PM

## 2018-01-11 NOTE — ED Notes (Signed)
This RN went to subwait to speak with patient and family. Patient at CT scan. This RN spoke with patient's daughter and discussed importance of patient staying for further care/evaluation. [name] verbalized understanding of information discussed, and reported that she and patient would stay for further evaluation.

## 2018-01-11 NOTE — ED Notes (Signed)
Pt informed secretary that she was feeling better and wanted her IV out to go home. I talked with patient states she is still having some nausea and would like some more zofran before going home.  Talked with Dr. Jimmye Norman, pt can have zofran and will order CT non-contrast scan.  Pt agreeable with plan to stay for further evaluation, apologized for wait time.

## 2018-01-11 NOTE — ED Notes (Signed)
Date and time results received: 01/11/18 2208  Test: Lactic Acid Critical Value: 2.0  Name of Provider Notified: Paduchowski  Orders Received? Or Actions Taken?: None

## 2018-01-11 NOTE — ED Notes (Signed)
Patient transported to CT 

## 2018-01-11 NOTE — ED Notes (Signed)
Provided pt with a warm blanket 

## 2018-01-11 NOTE — ED Provider Notes (Signed)
[facility] Emergency Department Provider Note  Time seen: 8:35 PM  I have reviewed the triage vital signs and the nursing notes.   HISTORY  Chief Complaint Diarrhea and Emesis    HPI [name] is a 77 y.o. female with a past medical history of diabetes, gastric reflux, hypertension, hyperlipidemia, atrial fibrillation, presents to the emergency department with nausea vomiting lower abdominal pain and diarrhea.  According to the patient around 12 PM today she developed significant nausea vomiting with multiple episodes of very watery diarrhea.  Denies any black or bloody stool or vomit.  States moderate dull aching lower abdominal pain across the entire lower abdomen started around 12 PM today as well.  Denies any fever, mild cough which is chronic per daughter, largely negative review of systems otherwise.   Past Medical History:  Diagnosis Date  . Atrial fibrillation ([place])   . Congestive heart failure (CHF) ([place])   . Diabetes mellitus without complication ([place])   . GERD (gastroesophageal reflux disease)   . Hyperlipidemia   . Hypertension     Patient Active Problem List   Diagnosis Date Noted  . Uncontrolled type 2 diabetes mellitus with hyperglycemia ([place]) 12/28/2017  . Atrial fibrillation ([place]) 12/28/2017  . Encounter for therapeutic drug monitoring 12/28/2017  . Primary generalized (osteo)arthritis 12/28/2017  . Hypertension 12/27/2017  . GERD (gastroesophageal reflux disease) 12/27/2017    Past Surgical History:  Procedure Laterality Date  . ABDOMINAL HYSTERECTOMY    . APPENDECTOMY    . CATARACT EXTRACTION    . CHOLECYSTECTOMY    . HERNIA REPAIR    . right knee replacement    . right nephroectomy      Prior to Admission medications   Medication Sig Start Date End Date Taking? Authorizing Provider  ACCU-CHEK FASTCLIX LANCETS Kenvil  05/26/17   [provider]  Alcohol Swabs (B-D SINGLE USE SWABS REGULAR) PADS  07/05/17    [provider]  atorvastatin (LIPITOR) 10 MG tablet Take 1 tablet (10 mg total) by mouth every evening. 12/05/17   Ronnell Freshwater, NP  citalopram (CELEXA) 40 MG tablet Take 1 tablet (40 mg total) by mouth daily. 12/05/17   Ronnell Freshwater, NP  digoxin (LANOXIN) 0.125 MG tablet Take 125 mcg by mouth daily. 11/03/16   [provider]  donepezil (ARICEPT) 5 MG tablet  04/30/17   [provider]  furosemide (LASIX) 20 MG tablet Take 20 mg by mouth daily. 09/27/16   [provider]  glucose blood (ACCU-CHEK GUIDE) test strip  07/01/17   [provider]  glucose blood test strip Use as instructed to check blood sugars twice a day.  E11.65 10/31/17   Lavera Guise, MD  Insulin Glargine (TOUJEO SOLOSTAR) 300 UNIT/ML SOPN Inject 20 mg into the skin daily.    [provider]  Insulin Pen Needle (BD PEN NEEDLE NANO U/F) 32G X 4 MM MISC  07/04/17   [provider]  liraglutide (VICTOZA) 18 MG/3ML SOPN Inject 0.2 mLs (1.2 mg total) into the skin daily. 12/13/17   Ronnell Freshwater, NP  pantoprazole (PROTONIX) 40 MG tablet Take 40 mg by mouth 2 (two) times daily. 11/17/16   [provider]  rOPINIRole (REQUIP) 0.5 MG tablet Take 1 tablet (0.5 mg total) by mouth every evening. 12/05/17   Ronnell Freshwater, NP  tiZANidine (ZANAFLEX) 2 MG tablet  02/10/15   [provider]  traMADol (ULTRAM) 50 MG tablet Take 1 tablet (50  mg total) by mouth 2 (two) times daily. 12/27/17   Ronnell Freshwater, NP  warfarin (COUMADIN) 4 MG tablet Take 4 mg by mouth See admin instructions. tk 1.5 tabs on Tuesday and Friday, and 1 tablet on the rest of the days 09/27/16   [provider]    No Known Allergies  Family History  Problem Relation Age of Onset  . Breast cancer Mother     Social History Social History   Tobacco Use  . Smoking status: Current Every Day Smoker    Packs/day: 1.00    Types: Cigarettes  . Smokeless tobacco: Never Used   Substance Use Topics  . Alcohol use: No  . Drug use: No    Review of Systems Constitutional: Negative for fever. Eyes: Negative for visual complaints ENT: Negative for recent illness/congestion Cardiovascular: Negative for chest pain. Respiratory: Negative for shortness of breath.  Mild cough, chronic. Gastrointestinal: Moderate dull aching lower abdominal pain, positive for nausea vomiting diarrhea. Genitourinary: Negative for urinary compaints.  Denies dysuria or hematuria. Musculoskeletal: Negative for musculoskeletal complaints Skin: Negative for skin complaints  Neurological: Negative for headache All other ROS negative  ____________________________________________   PHYSICAL EXAM:  VITAL SIGNS: ED Triage Vitals  Enc Vitals Group     BP 01/11/18 1510 (!) 150/76     Pulse Rate 01/11/18 1510 (!) 105     Resp 01/11/18 1510 18     Temp 01/11/18 1510 (!) 97.5 F (36.4 C)     Temp src --      SpO2 01/11/18 1510 98 %     Weight 01/11/18 1512 139 lb (63 kg)     Height 01/11/18 1512 5' (1.524 m)     Head Circumference --      Peak Flow --      Pain Score 01/11/18 1512 4     Pain Loc --      Pain Edu? --      Excl. in [place]? --    Constitutional: Alert and oriented. Well appearing and in no distress. Eyes: Normal exam ENT   Head: Normocephalic and atraumatic.   Mouth/Throat: Mucous membranes are moist. Cardiovascular: Normal rate, regular rhythm around 100 bpm. Respiratory: Normal respiratory effort without tachypnea nor retractions. Breath sounds are clear Gastrointestinal: Soft, moderate lower abdominal tenderness to palpation across the entire lower abdomen.  No focal tenderness identified.  No rebound or guarding.  No distention. Musculoskeletal: Nontender with normal range of motion in all extremities.  No edema or tenderness. Neurologic:  Normal speech and language. No gross focal neurologic deficits Skin:  Skin is warm, dry and intact.  Psychiatric: Mood  and affect are normal.   ____________________________________________    EKG  EKG reviewed and interpreted by myself shows atrial fibrillation at 105 bpm with a narrow QRS, left axis deviation, largely normal intervals otherwise, nonspecific ST changes without elevation.  ____________________________________________    RADIOLOGY  CT scan shows sigmoid colitis  ____________________________________________   INITIAL IMPRESSION / ASSESSMENT AND PLAN / ED COURSE  Pertinent labs & imaging results that were available during my care of the patient were reviewed by me and considered in my medical decision making (see chart for details).  Patient presents to the emergency department acute onset of lower abdominal pain, nausea vomiting diarrhea.  Differential would include gastroenteritis, enteritis, infectious etiology, colitis, diverticulitis, pancreatitis.  Unfortunately patient had a very long ER wait time greater than 6 hours.  Upon my evaluation patient is moderate lower abdominal pain,  afebrile but tachycardic around 100-105 bpm, white blood cell count resulted at 25,000 with a CT consistent with colitis.  Given the patient's elevated white blood cell count, tachycardia and infectious source I activated sepsis protocols we will initiate IV Zosyn for treatment.  We will send cultures and admit to the hospitalist service for further workup and treatment.  We will attempt to obtain a stool sample for biofire and C. difficile testing.  I have placed the patient on enteric precautions.  We will infuse IV fluids.  Patient and daughter agreeable to this plan of care.  Urinalysis shows too numerous to count red cells but no white cells and few bacteria.  Urine culture has been added on.  CRITICAL CARE Performed by: Harvest Dark   Total critical care time: 30 minutes  Critical care time was exclusive of separately billable procedures and treating other patients.  Critical care was  necessary to treat or prevent imminent or life-threatening deterioration.  Critical care was time spent personally by me on the following activities: development of treatment plan with patient and/or surrogate as well as nursing, discussions with consultants, evaluation of patient's response to treatment, examination of patient, obtaining history from patient or surrogate, ordering and performing treatments and interventions, ordering and review of laboratory studies, ordering and review of radiographic studies, pulse oximetry and re-evaluation of patient's condition.   ____________________________________________   FINAL CLINICAL IMPRESSION(S) / ED DIAGNOSES  Sepsis Colitis Vomiting and diarrhea    Harvest Dark, MD 01/11/18 2137

## 2018-01-11 NOTE — ED Triage Notes (Signed)
Pt arrived via POV from home, pt was at adult day care today, and started having diarrhea and vomiting around 12pm.  Pt states after she finished eating her lunch she vomited.  Pt had diarrhea 4x since 12pm and vomited 5-6x since 12pm..   Pt states she is having pain in her lower abdomen.   Pt vomited x 2 since arriving to ED.

## 2018-01-12 DIAGNOSIS — I5042 Chronic combined systolic (congestive) and diastolic (congestive) heart failure: Secondary | ICD-10-CM | POA: Diagnosis present

## 2018-01-12 DIAGNOSIS — N184 Chronic kidney disease, stage 4 (severe): Secondary | ICD-10-CM | POA: Diagnosis present

## 2018-01-12 DIAGNOSIS — N183 Chronic kidney disease, stage 3 unspecified: Secondary | ICD-10-CM | POA: Diagnosis present

## 2018-01-12 DIAGNOSIS — A419 Sepsis, unspecified organism: Secondary | ICD-10-CM | POA: Diagnosis present

## 2018-01-12 DIAGNOSIS — E1122 Type 2 diabetes mellitus with diabetic chronic kidney disease: Secondary | ICD-10-CM | POA: Diagnosis present

## 2018-01-12 DIAGNOSIS — Z794 Long term (current) use of insulin: Secondary | ICD-10-CM | POA: Diagnosis not present

## 2018-01-12 DIAGNOSIS — Z79899 Other long term (current) drug therapy: Secondary | ICD-10-CM | POA: Diagnosis not present

## 2018-01-12 DIAGNOSIS — A09 Infectious gastroenteritis and colitis, unspecified: Secondary | ICD-10-CM | POA: Diagnosis present

## 2018-01-12 DIAGNOSIS — K529 Noninfective gastroenteritis and colitis, unspecified: Secondary | ICD-10-CM | POA: Diagnosis present

## 2018-01-12 DIAGNOSIS — Z66 Do not resuscitate: Secondary | ICD-10-CM | POA: Diagnosis present

## 2018-01-12 DIAGNOSIS — K219 Gastro-esophageal reflux disease without esophagitis: Secondary | ICD-10-CM | POA: Diagnosis present

## 2018-01-12 DIAGNOSIS — E785 Hyperlipidemia, unspecified: Secondary | ICD-10-CM | POA: Diagnosis present

## 2018-01-12 DIAGNOSIS — Z7901 Long term (current) use of anticoagulants: Secondary | ICD-10-CM | POA: Diagnosis not present

## 2018-01-12 DIAGNOSIS — I130 Hypertensive heart and chronic kidney disease with heart failure and stage 1 through stage 4 chronic kidney disease, or unspecified chronic kidney disease: Secondary | ICD-10-CM | POA: Diagnosis present

## 2018-01-12 DIAGNOSIS — Z96651 Presence of right artificial knee joint: Secondary | ICD-10-CM | POA: Diagnosis present

## 2018-01-12 DIAGNOSIS — I480 Paroxysmal atrial fibrillation: Secondary | ICD-10-CM | POA: Diagnosis present

## 2018-01-12 DIAGNOSIS — F17210 Nicotine dependence, cigarettes, uncomplicated: Secondary | ICD-10-CM | POA: Diagnosis present

## 2018-01-12 LAB — BASIC METABOLIC PANEL
ANION GAP: 9 (ref 5–15)
BUN: 30 mg/dL — AB (ref 6–20)
CALCIUM: 8.8 mg/dL — AB (ref 8.9–10.3)
CO2: 29 mmol/L (ref 22–32)
Chloride: 100 mmol/L — ABNORMAL LOW (ref 101–111)
Creatinine, Ser: 1.83 mg/dL — ABNORMAL HIGH (ref 0.44–1.00)
GFR calc Af Amer: 30 mL/min — ABNORMAL LOW (ref >60)
GFR calc non Af Amer: 26 mL/min — ABNORMAL LOW (ref >60)
GLUCOSE: 167 mg/dL — AB (ref 65–99)
POTASSIUM: 4.8 mmol/L (ref 3.5–5.1)
Sodium: 138 mmol/L (ref 135–145)

## 2018-01-12 LAB — CBC
HEMATOCRIT: 39.7 % (ref 35.0–47.0)
HEMOGLOBIN: 13.4 g/dL (ref 12.0–16.0)
MCH: 32.1 pg (ref 26.0–34.0)
MCHC: 33.9 g/dL (ref 32.0–36.0)
MCV: 94.7 fL (ref 80.0–100.0)
Platelets: 214 10*3/uL (ref 150–440)
RBC: 4.19 MIL/uL (ref 3.80–5.20)
RDW: 13.5 % (ref 11.5–14.5)
WBC: 13.9 10*3/uL — ABNORMAL HIGH (ref 3.6–11.0)

## 2018-01-12 LAB — GLUCOSE, CAPILLARY
Glucose-Capillary: 100 mg/dL — ABNORMAL HIGH (ref 65–99)
Glucose-Capillary: 149 mg/dL — ABNORMAL HIGH (ref 65–99)
Glucose-Capillary: 209 mg/dL — ABNORMAL HIGH (ref 65–99)

## 2018-01-12 LAB — LACTIC ACID, PLASMA
LACTIC ACID, VENOUS: 1.9 mmol/L (ref 0.5–1.9)
LACTIC ACID, VENOUS: 1.1 mmol/L (ref 0.5–1.9)

## 2018-01-12 LAB — PROTIME-INR
INR: 1.89
Prothrombin Time: 21.5 seconds — ABNORMAL HIGH (ref 11.4–15.2)

## 2018-01-12 MED ORDER — ACETAMINOPHEN 650 MG RE SUPP: 650.0000 mg | Freq: Four times a day (QID) | RECTAL | Status: DC | PRN

## 2018-01-12 MED ORDER — INSULIN ASPART 100 UNIT/ML ~~LOC~~ SOLN
0.0000 [IU] | Freq: Three times a day (TID) | SUBCUTANEOUS | Status: DC
  Administered 2018-01-12: 2 [IU] via SUBCUTANEOUS
  Administered 2018-01-12: 1 [IU] via SUBCUTANEOUS
  Administered 2018-01-13: 2 [IU] via SUBCUTANEOUS
  Administered 2018-01-14: 3 [IU] via SUBCUTANEOUS
  Administered 2018-01-14: 1 [IU] via SUBCUTANEOUS
  Administered 2018-01-15: 3 [IU] via SUBCUTANEOUS
  Administered 2018-01-15: 2 [IU] via SUBCUTANEOUS
  Filled 2018-01-12 (×8): qty 1

## 2018-01-12 MED ORDER — DIGOXIN 125 MCG PO TABS
125.0000 ug | ORAL_TABLET | Freq: Every day | ORAL | Status: DC
  Administered 2018-01-12 – 2018-01-13 (×2): 125 ug via ORAL
  Filled 2018-01-12 (×4): qty 1

## 2018-01-12 MED ORDER — ONDANSETRON HCL 4 MG/2ML IJ SOLN
4.0000 mg | Freq: Four times a day (QID) | INTRAMUSCULAR | Status: DC | PRN
  Administered 2018-01-12 – 2018-01-13 (×2): 4 mg via INTRAVENOUS
  Filled 2018-01-12 (×2): qty 2

## 2018-01-12 MED ORDER — PANTOPRAZOLE SODIUM 40 MG PO TBEC
40.0000 mg | DELAYED_RELEASE_TABLET | Freq: Two times a day (BID) | ORAL | Status: DC
Start: 2018-01-12 — End: 2018-01-15
  Administered 2018-01-12 – 2018-01-15 (×7): 40 mg via ORAL
  Filled 2018-01-12 (×7): qty 1

## 2018-01-12 MED ORDER — INSULIN ASPART 100 UNIT/ML ~~LOC~~ SOLN
0.0000 [IU] | Freq: Every day | SUBCUTANEOUS | Status: DC
  Administered 2018-01-12 – 2018-01-14 (×2): 2 [IU] via SUBCUTANEOUS
  Filled 2018-01-12 (×2): qty 1

## 2018-01-12 MED ORDER — ACETAMINOPHEN 325 MG PO TABS: 650.0000 mg | ORAL_TABLET | Freq: Four times a day (QID) | ORAL | Status: DC | PRN

## 2018-01-12 MED ORDER — ATORVASTATIN CALCIUM 10 MG PO TABS
10.0000 mg | ORAL_TABLET | Freq: Every evening | ORAL | Status: DC
  Administered 2018-01-12 – 2018-01-14 (×3): 10 mg via ORAL
  Filled 2018-01-12 (×3): qty 1

## 2018-01-12 MED ORDER — ONDANSETRON HCL 4 MG PO TABS: 4.0000 mg | ORAL_TABLET | Freq: Four times a day (QID) | ORAL | Status: DC | PRN

## 2018-01-12 MED ORDER — OXYCODONE HCL 5 MG PO TABS
5.0000 mg | ORAL_TABLET | ORAL | Status: DC | PRN
  Administered 2018-01-12 – 2018-01-14 (×4): 5 mg via ORAL
  Filled 2018-01-12 (×4): qty 1

## 2018-01-12 MED ORDER — WARFARIN SODIUM 6 MG PO TABS: 6.0000 mg | ORAL_TABLET | ORAL | Status: DC

## 2018-01-12 MED ORDER — WARFARIN SODIUM 4 MG PO TABS
4.0000 mg | ORAL_TABLET | ORAL | Status: DC
  Administered 2018-01-12 – 2018-01-13 (×2): 4 mg via ORAL
  Filled 2018-01-12 (×2): qty 1

## 2018-01-12 MED ORDER — SODIUM CHLORIDE 0.9 % IV SOLN
INTRAVENOUS | Status: AC
  Administered 2018-01-12: 03:00:00 via INTRAVENOUS

## 2018-01-12 MED ORDER — CITALOPRAM HYDROBROMIDE 20 MG PO TABS
40.0000 mg | ORAL_TABLET | Freq: Every day | ORAL | Status: DC
  Administered 2018-01-12 – 2018-01-14 (×3): 40 mg via ORAL
  Filled 2018-01-12 (×3): qty 2

## 2018-01-12 NOTE — Progress Notes (Signed)
CODE SEPSIS - PHARMACY COMMUNICATION  **Broad Spectrum Antibiotics should be administered within 1 hour of Sepsis diagnosis**  Time Code Sepsis Called/Page Received: 03/23 0059  Antibiotics Ordered: 03/22 2027  Time of 1st antibiotic administration: 03/22 2108  Additional action taken by pharmacy:  If necessary, Name of Provider/Nurse Contacted:     Eloise Harman ,PharmD Clinical Pharmacist  01/12/2018  1:01 AM

## 2018-01-12 NOTE — H&P (Signed)
[facility] [place] NAME: [name]    MR#:  [id number]  DATE OF BIRTH:  Jan 30, 1941  DATE OF ADMISSION:  01/11/2018  PRIMARY CARE PHYSICIAN: Lavera Guise, MD   REQUESTING/REFERRING PHYSICIAN: Kerman Passey, MD  CHIEF COMPLAINT:   Chief Complaint  Patient presents with  . Diarrhea  . Emesis    HISTORY OF PRESENT ILLNESS:  [name]  is a 77 y.o. female who presents with an episode of diarrhea and vomiting today, with some abdominal discomfort.  Here in the ED she was found to meet sepsis criteria, and found on CT scan to have colitis.  Hospitalist were called for admission  PAST MEDICAL HISTORY:   Past Medical History:  Diagnosis Date  . Atrial fibrillation ([place])   . Congestive heart failure (CHF) ([place])   . Diabetes mellitus without complication ([place])   . GERD (gastroesophageal reflux disease)   . Hyperlipidemia   . Hypertension      PAST SURGICAL HISTORY:   Past Surgical History:  Procedure Laterality Date  . ABDOMINAL HYSTERECTOMY    . APPENDECTOMY    . CATARACT EXTRACTION    . CHOLECYSTECTOMY    . HERNIA REPAIR    . right knee replacement    . right nephroectomy       SOCIAL HISTORY:   Social History   Tobacco Use  . Smoking status: Current Every Day Smoker    Packs/day: 1.00    Types: Cigarettes  . Smokeless tobacco: Never Used  Substance Use Topics  . Alcohol use: No     FAMILY HISTORY:   Family History  Problem Relation Age of Onset  . Breast cancer Mother      DRUG ALLERGIES:  No Known Allergies  MEDICATIONS AT HOME:   Prior to Admission medications   Medication Sig Start Date End Date Taking? Authorizing Provider  atorvastatin (LIPITOR) 10 MG tablet Take 1 tablet (10 mg total) by mouth every evening. 12/05/17  Yes Boscia, Greer Ee, NP  citalopram (CELEXA) 40 MG tablet Take 1 tablet (40 mg total) by mouth daily. 12/05/17  Yes Boscia, Greer Ee, NP  digoxin (LANOXIN) 0.125 MG  tablet Take 125 mcg by mouth daily. 11/03/16  Yes [provider]  furosemide (LASIX) 20 MG tablet Take 20 mg by mouth daily. 09/27/16  Yes [provider]  Insulin Glargine (TOUJEO SOLOSTAR) 300 UNIT/ML SOPN Inject 20 mg into the skin daily.   Yes [provider]  liraglutide (VICTOZA) 18 MG/3ML SOPN Inject 0.2 mLs (1.2 mg total) into the skin daily. 12/13/17  Yes Boscia, Heather E, NP  pantoprazole (PROTONIX) 40 MG tablet Take 40 mg by mouth 2 (two) times daily. 11/17/16  Yes [provider]  rOPINIRole (REQUIP) 0.5 MG tablet Take 1 tablet (0.5 mg total) by mouth every evening. 12/05/17  Yes Boscia, Greer Ee, NP  traMADol (ULTRAM) 50 MG tablet Take 1 tablet (50 mg total) by mouth 2 (two) times daily. 12/27/17  Yes Ronnell Freshwater, NP  warfarin (COUMADIN) 4 MG tablet Take 4 mg by mouth See admin instructions. tk 1.5 tabs on Tuesday and Friday, and 1 tablet on the rest of the days 09/27/16  Yes [provider]  [place]  05/26/17   [provider]  Alcohol Swabs (B-D SINGLE USE SWABS REGULAR) PADS  07/05/17   [provider]  donepezil (ARICEPT) 5 MG tablet  04/30/17   [provider]  glucose  blood (ACCU-CHEK GUIDE) test strip  07/01/17   [provider]  glucose blood test strip Use as instructed to check blood sugars twice a day.  E11.65 10/31/17   Lavera Guise, MD  Insulin Pen Needle (BD PEN NEEDLE NANO U/F) 32G X 4 MM MISC  07/04/17   [provider]  tiZANidine (ZANAFLEX) 2 MG tablet  02/10/15   [provider]    REVIEW OF SYSTEMS:  Review of Systems  Constitutional: Negative for chills, fever, malaise/fatigue and weight loss.  HENT: Negative for ear pain, hearing loss and tinnitus.   Eyes: Negative for blurred vision, double vision, pain and redness.  Respiratory: Negative for cough, hemoptysis and shortness of breath.   Cardiovascular: Negative for chest pain, palpitations,  orthopnea and leg swelling.  Gastrointestinal: Positive for abdominal pain, diarrhea, nausea and vomiting. Negative for constipation.  Genitourinary: Negative for dysuria, frequency and hematuria.  Musculoskeletal: Negative for back pain, joint pain and neck pain.  Skin:       No acne, rash, or lesions  Neurological: Negative for dizziness, tremors, focal weakness and weakness.  Endo/Heme/Allergies: Negative for polydipsia. Does not bruise/bleed easily.  Psychiatric/Behavioral: Negative for depression. The patient is not nervous/anxious and does not have insomnia.      VITAL SIGNS:   Vitals:   01/11/18 2107 01/11/18 2130 01/11/18 2200 01/12/18 0000  BP:  (!) 119/59 121/60 123/62  Pulse:  93 (!) 107 90  Resp:  18 17 19   Temp:      SpO2:  93% 94% 95%  Weight: 62.1 kg (137 lb)     Height: 5' (1.524 m)      Wt Readings from Last 3 Encounters:  01/11/18 62.1 kg (137 lb)  12/27/17 63 kg (139 lb)  11/26/16 67.6 kg (149 lb)    PHYSICAL EXAMINATION:  Physical Exam  Vitals reviewed. Constitutional: She is oriented to person, place, and time. She appears well-developed and well-nourished. No distress.  HENT:  Head: Normocephalic and atraumatic.  Mouth/Throat: Oropharynx is clear and moist.  Eyes: Pupils are equal, round, and reactive to light. Conjunctivae and EOM are normal. No scleral icterus.  Neck: Normal range of motion. Neck supple. No JVD present. No thyromegaly present.  Cardiovascular: Regular rhythm and intact distal pulses. Exam reveals no gallop and no friction rub.  No murmur heard. Borderline tachycardic  Respiratory: Effort normal and breath sounds normal. No respiratory distress. She has no wheezes. She has no rales.  GI: Soft. Bowel sounds are normal. She exhibits no distension. There is tenderness.  Musculoskeletal: Normal range of motion. She exhibits no edema.  No arthritis, no gout  Lymphadenopathy:    She has no cervical adenopathy.  Neurological: She is  alert and oriented to person, place, and time. No cranial nerve deficit.  No dysarthria, no aphasia  Skin: Skin is warm and dry. No rash noted. No erythema.  Psychiatric: She has a normal mood and affect. Her behavior is normal. Judgment and thought content normal.    LABORATORY PANEL:   CBC Recent Labs  Lab 01/11/18 1521  WBC 25.2*  HGB 15.9  HCT 46.8  PLT 246   ------------------------------------------------------------------------------------------------------------------  Chemistries  Recent Labs  Lab 01/11/18 1521  NA 136  K 4.8  CL 97*  CO2 25  GLUCOSE 271*  BUN 22*  CREATININE 1.52*  CALCIUM 9.4  AST 37  ALT 12*  ALKPHOS 91  BILITOT 1.3*   ------------------------------------------------------------------------------------------------------------------  Cardiac Enzymes No results for input(s): TROPONINI in  the last 168 hours. ------------------------------------------------------------------------------------------------------------------  RADIOLOGY:  Ct Renal Stone Study  Result Date: 01/11/2018 CLINICAL DATA:  Diarrhea and vomiting since 1200 hours, lower abdominal pain, history atrial fibrillation, CHF, diabetes mellitus, hypertension, smoker EXAM: CT ABDOMEN AND PELVIS WITHOUT CONTRAST TECHNIQUE: Multidetector CT imaging of the abdomen and pelvis was performed following the standard protocol without IV contrast. COMPARISON:  03/02/2014 FINDINGS: Lower chest: Emphysematous changes and minimal parenchymal scarring at both lung bases. Hepatobiliary: Gallbladder surgically absent. No focal hepatic abnormalities. Pancreas: Normal appearance Spleen: Normal appearance Adrenals/Urinary Tract: Thickening of adrenal glands without discrete mass. Absent RIGHT kidney. Tiny nonobstructing LEFT renal calculi. No hydronephrosis or LEFT ureteral dilatation. Multiple tiny calcifications are seen in the LEFT pelvis at/near the distal LEFT ureter, which is not well localized  near the bladder, grossly unchanged. No definite ureteral calcification. Bladder unremarkable. Stomach/Bowel: Appendix surgically absent by history. Wall thickening of the rectosigmoid colon question colitis. Stomach under distended unable to exclude gastric wall thickening. Small bowel loops unremarkable. Vascular/Lymphatic: Extensive atherosclerotic calcifications aorta and iliac arteries. Numerous pelvic phleboliths. Reproductive: Uterus surgically absent. Nonvisualization of RIGHT ovary. LEFT ovary normal appearance. Other: No free air or free fluid. No hernia or acute inflammatory process. Small calcification anterior to the bladder is unchanged. Calcifications in RIGHT pelvis in a long linear array appear grossly stable. Musculoskeletal: Osseous demineralization with scattered degenerative disc disease changes of the thoracolumbar spine. IMPRESSION: Wall thickening of the rectosigmoid colon consistent with colitis, consider infection and inflammatory bowel disease. Tiny nonobstructing LEFT renal calculi. Absent RIGHT kidney. Aortic Atherosclerosis (ICD10-I70.0) and Emphysema (ICD10-J43.9). Electronically Signed   By: Lavonia Dana M.D.   On: 01/11/2018 19:39    EKG:   Orders placed or performed during the hospital encounter of 01/11/18  . ED EKG 12-Lead  . ED EKG 12-Lead  . ED EKG  . ED EKG  . EKG 12-Lead  . EKG 12-Lead    IMPRESSION AND PLAN:  Principal Problem:   Sepsis ([place]) -secondary to colitis, IV antibiotics started, lactic acid borderline elevated, IV fluids in place and we will continue to check until it is within normal limits.  Blood pressure stable, cultures sent Active Problems:   Colitis -IV antibiotics as above   Hypertension -hold antihypertensives for now as the patient is normotensive   Diabetes ([place]) -sliding scale insulin with corresponding glucose checks   AF (paroxysmal atrial fibrillation) ([facility]) -continue home meds except those that would lower her blood pressure too  much, continue anticoagulation at home dose   GERD (gastroesophageal reflux disease) -dose PPI   CKD (chronic kidney disease), stage III ([facility]) -at baseline, monitor and avoid nephrotoxins  Chart review performed and case discussed with ED provider. Labs, imaging and/or ECG reviewed by provider and discussed with patient/family. Management plans discussed with the patient and/or family.  DVT PROPHYLAXIS: SubQ lovenox  GI PROPHYLAXIS: PPI  ADMISSION STATUS: Inpatient  CODE STATUS: Full  TOTAL TIME TAKING CARE OF THIS PATIENT: 45 minutes.   Jannifer Franklin, Cordai Rodrigue FIELDING 01/12/2018, 12:49 AM  [facility]  Office  [phone]  CC: Primary care physician; Lavera Guise, MD  Note:  This document was prepared using Dragon voice recognition software and may include unintentional dictation errors.

## 2018-01-12 NOTE — Progress Notes (Signed)
Family Meeting Note  Advance Directive:no  Today a meeting took place with the Patient.and family   The following clinical team members were present during this meeting:MD  The following were discussed:Patient's diagnosis: coloitis, Patient's progosis: > 12 months and Goals for treatment: DNR  Additional follow-up to be provided: DNR placed in chart and chaplain consult to start advanced directives  Time spent during discussion:17 minutes  [name], Ulice Bold, MD

## 2018-01-12 NOTE — Progress Notes (Signed)
C/o pain in low abd/pelvic region and low back, medicated-see MAR.

## 2018-01-12 NOTE — Progress Notes (Signed)
   01/12/18 1510  Clinical Encounter Type  Visited With Patient not available  Visit Type Initial (order request for advanced directive)  Referral From Nurse;Physician  Consult/Referral To Chaplain   Chaplain responded to OR regarding advanced directive.  Patient did not respond to chaplain knock; chaplain entered room, patient did not wake to chaplain's voice.  Chaplain will clear out one of two spiritual consults as they are duplicate.

## 2018-01-12 NOTE — Progress Notes (Signed)
ANTICOAGULATION CONSULT NOTE - Initial Consult  Pharmacy Consult for warfarin dosing Indication: atrial fibrillation  No Known Allergies  Patient Measurements: Height: 5' (152.4 cm) Weight: 136 lb 12.8 oz (62.1 kg) IBW/kg (Calculated) : 45.5 Heparin Dosing Weight: n/a  Vital Signs: Temp: 97.6 F (36.4 C) (03/23 0649) Temp Source: Oral (03/23 0649) BP: 128/40 (03/23 0649) Pulse Rate: 60 (03/23 0649)  Labs: Recent Labs    01/11/18 1521 01/12/18 0245 01/12/18 0612  HGB 15.9 13.4  --   HCT 46.8 39.7  --   PLT 246 214  --   LABPROT  --   --  21.5*  INR  --   --  1.89  CREATININE 1.52* 1.83*  --     Estimated Creatinine Clearance: 21.5 mL/min (A) (by C-G formula based on SCr of 1.83 mg/dL (H)).   Medical History: Past Medical History:  Diagnosis Date  . Atrial fibrillation ([place])   . Congestive heart failure (CHF) ([place])   . Diabetes mellitus without complication ([place])   . GERD (gastroesophageal reflux disease)   . Hyperlipidemia   . Hypertension     Medications:  Home dose of warfarin 4 mg Su, M, w, Th, Sa; 6 mg Tu, F.  Assessment: INR slightly subtherapeutic on admission  Goal of Therapy:  INR 2-3    Plan:  Resume home regimen as above. Daily INR while on antibiotics.  [name] S 01/12/2018,7:08 AM

## 2018-01-12 NOTE — Progress Notes (Signed)
Patient admitted for colitis.  Agree with admitting MD plan. Follow-up on stool cultures.

## 2018-01-13 LAB — C DIFFICILE QUICK SCREEN W PCR REFLEX
C Diff antigen: NEGATIVE
C Diff interpretation: NOT DETECTED
C Diff toxin: NEGATIVE

## 2018-01-13 LAB — GLUCOSE, CAPILLARY
GLUCOSE-CAPILLARY: 99 mg/dL (ref 65–99)
Glucose-Capillary: 169 mg/dL — ABNORMAL HIGH (ref 65–99)
Glucose-Capillary: 197 mg/dL — ABNORMAL HIGH (ref 65–99)

## 2018-01-13 LAB — URINE CULTURE

## 2018-01-13 LAB — PROTIME-INR
INR: 1.63
PROTHROMBIN TIME: 19.2 s — AB (ref 11.4–15.2)

## 2018-01-13 MED ORDER — SODIUM CHLORIDE 0.9 % IV SOLN
INTRAVENOUS | Status: DC
Start: 2018-01-13 — End: 2018-01-15
  Administered 2018-01-13 – 2018-01-15 (×4): via INTRAVENOUS

## 2018-01-13 NOTE — Progress Notes (Signed)
ANTICOAGULATION CONSULT NOTE   Pharmacy Consult for warfarin dosing Indication: atrial fibrillation  No Known Allergies  Patient Measurements: Height: 5' (152.4 cm) Weight: 136 lb 12.8 oz (62.1 kg) IBW/kg (Calculated) : 45.5 Heparin Dosing Weight: n/a  Vital Signs: Temp: 97.8 F (36.6 C) (03/24 0443) Temp Source: Oral (03/24 0443) BP: 106/80 (03/24 0443) Pulse Rate: 63 (03/24 0443)  Labs: Recent Labs    01/11/18 1521 01/12/18 0245 01/12/18 0612 01/13/18 0602  HGB 15.9 13.4  --   --   HCT 46.8 39.7  --   --   PLT 246 214  --   --   LABPROT  --   --  21.5* 19.2*  INR  --   --  1.89 1.63  CREATININE 1.52* 1.83*  --   --     Estimated Creatinine Clearance: 21.5 mL/min (A) (by C-G formula based on SCr of 1.83 mg/dL (H)).   Medical History: Past Medical History:  Diagnosis Date  . Atrial fibrillation ([place])   . Congestive heart failure (CHF) ([place])   . Diabetes mellitus without complication ([place])   . GERD (gastroesophageal reflux disease)   . Hyperlipidemia   . Hypertension     Medications:  Home dose of warfarin 4 mg Su, M, w, Th, Sa; 6 mg Tu, F.  Assessment: INR slightly subtherapeutic on admission  Goal of Therapy:  INR 2-3   Plan:  3/24  INR = 1.63.  Resume home regimen as above. Daily INR while on antibiotics.  [name], [name] 01/13/2018,1:36 PM

## 2018-01-13 NOTE — Progress Notes (Signed)
[facility] [place] NAME: [name]    MR#:  [id number]  DATE OF BIRTH:  [birth year]  SUBJECTIVE:   Patient has some lower abdominal pain and feels nauseas this am No diarrhea REVIEW OF SYSTEMS:    Review of Systems  Constitutional: Negative for fever, chills weight loss HENT: Negative for ear pain, nosebleeds, congestion, facial swelling, rhinorrhea, neck pain, neck stiffness and ear discharge.   Respiratory: Negative for cough, shortness of breath, wheezing  Cardiovascular: Negative for chest pain, palpitations and leg swelling.  Gastrointestinal: Negative for heartburn, ++abdominal pain, NO vomiting, diarrhea or consitpation Genitourinary: Negative for dysuria, urgency, frequency, hematuria Musculoskeletal: Negative for back pain or joint pain Neurological: Negative for dizziness, seizures, syncope, focal weakness,  numbness and headaches.  Hematological: Does not bruise/bleed easily.  Psychiatric/Behavioral: Negative for hallucinations, confusion, dysphoric mood    Tolerating Diet: yes      DRUG ALLERGIES:  No Known Allergies  VITALS:  Blood pressure 106/80, pulse 63, temperature 97.8 F (36.6 C), temperature source Oral, resp. rate 16, height 5' (1.524 m), weight 62.1 kg (136 lb 12.8 oz), SpO2 99 %.  PHYSICAL EXAMINATION:  Constitutional: Appears well-developed and well-nourished. No distress. HENT: Normocephalic. Marland Kitchen Oropharynx is clear and moist.  Eyes: Conjunctivae and EOM are normal. PERRLA, no scleral icterus.  Neck: Normal ROM. Neck supple. No JVD. No tracheal deviation. CVS: Irr, irr S1/S2 +, 2/6 SEM, no gallops, no carotid bruit.  Pulmonary: Effort and breath sounds normal, no stridor, rhonchi, wheezes, rales.  Abdominal: Soft. BS +,  no distension,++ generalized tenderness, NO rebound or guarding.  Musculoskeletal: Normal range of motion. No edema and no tenderness.  Neuro: Alert. CN 2-12 grossly intact. No focal  deficits. Skin: Skin is warm and dry. No rash noted. Psychiatric: Normal mood and affect.      LABORATORY PANEL:   CBC Recent Labs  Lab 01/12/18 0245  WBC 13.9*  HGB 13.4  HCT 39.7  PLT 214   ------------------------------------------------------------------------------------------------------------------  Chemistries  Recent Labs  Lab 01/11/18 1521 01/12/18 0245  NA 136 138  K 4.8 4.8  CL 97* 100*  CO2 25 29  GLUCOSE 271* 167*  BUN 22* 30*  CREATININE 1.52* 1.83*  CALCIUM 9.4 8.8*  AST 37  --   ALT 12*  --   ALKPHOS 91  --   BILITOT 1.3*  --    ------------------------------------------------------------------------------------------------------------------  Cardiac Enzymes No results for input(s): TROPONINI in the last 168 hours. ------------------------------------------------------------------------------------------------------------------  RADIOLOGY:  Ct Renal Stone Study  Result Date: 01/11/2018 CLINICAL DATA:  Diarrhea and vomiting since 1200 hours, lower abdominal pain, history atrial fibrillation, CHF, diabetes mellitus, hypertension, smoker EXAM: CT ABDOMEN AND PELVIS WITHOUT CONTRAST TECHNIQUE: Multidetector CT imaging of the abdomen and pelvis was performed following the standard protocol without IV contrast. COMPARISON:  03/02/2014 FINDINGS: Lower chest: Emphysematous changes and minimal parenchymal scarring at both lung bases. Hepatobiliary: Gallbladder surgically absent. No focal hepatic abnormalities. Pancreas: Normal appearance Spleen: Normal appearance Adrenals/Urinary Tract: Thickening of adrenal glands without discrete mass. Absent RIGHT kidney. Tiny nonobstructing LEFT renal calculi. No hydronephrosis or LEFT ureteral dilatation. Multiple tiny calcifications are seen in the LEFT pelvis at/near the distal LEFT ureter, which is not well localized near the bladder, grossly unchanged. No definite ureteral calcification. Bladder unremarkable.  Stomach/Bowel: Appendix surgically absent by history. Wall thickening of the rectosigmoid colon question colitis. Stomach under distended unable to exclude gastric wall thickening. Small bowel loops unremarkable. Vascular/Lymphatic: Extensive atherosclerotic  calcifications aorta and iliac arteries. Numerous pelvic phleboliths. Reproductive: Uterus surgically absent. Nonvisualization of RIGHT ovary. LEFT ovary normal appearance. Other: No free air or free fluid. No hernia or acute inflammatory process. Small calcification anterior to the bladder is unchanged. Calcifications in RIGHT pelvis in a long linear array appear grossly stable. Musculoskeletal: Osseous demineralization with scattered degenerative disc disease changes of the thoracolumbar spine. IMPRESSION: Wall thickening of the rectosigmoid colon consistent with colitis, consider infection and inflammatory bowel disease. Tiny nonobstructing LEFT renal calculi. Absent RIGHT kidney. Aortic Atherosclerosis (ICD10-I70.0) and Emphysema (ICD10-J43.9). Electronically Signed   By: Lavonia Dana M.D.   On: 01/11/2018 19:39     ASSESSMENT AND PLAN:   77 year old female with history of diabetes, chronic kidney disease stage IV, chronic combined systolic and diastolic heart failure who presents to emergency room due to abdominal discomfort, vomiting and diarrhea.  1.  Acute rectosigmoid colitis: Continue Zosyn Follow-up on C. Difficile  2.  Chronic combined systolic and diastolic heart failure without signs of exacerbation  3.  PAF: Continue Coumadin and digoxin  4.  Diabetes: Continue sliding scale  5.  Hyperlipidemia: Continue statin   PT evaluation for discharge planning  Management plans discussed with the patient and she is in agreement.  CODE STATUS: dnr  TOTAL TIME TAKING CARE OF THIS PATIENT: 27 minutes.     POSSIBLE D/C tomorrow DEPENDING ON CLINICAL CONDITION.   [name] M.D on 01/13/2018 at 12:22 PM  Between 7am to 6pm -  Pager - [phone] After 6pm go to [email or web address] - password [email or web address] [facility]  Office  [phone]  CC: Primary care physician; Lavera Guise, MD  Note: This dictation was prepared with Dragon dictation along with smaller phrase technology. Any transcriptional errors that result from this process are unintentional.

## 2018-01-13 NOTE — Progress Notes (Signed)
Advanced directive education request. Patient resting.     01/13/18 1000  Clinical Encounter Type  Visited With Patient  Visit Type Follow-up  Referral From Chaplain  Consult/Referral To Chaplain

## 2018-01-14 LAB — GLUCOSE, CAPILLARY
GLUCOSE-CAPILLARY: 195 mg/dL — AB (ref 65–99)
Glucose-Capillary: 167 mg/dL — ABNORMAL HIGH (ref 65–99)
Glucose-Capillary: 119 mg/dL — ABNORMAL HIGH (ref 65–99)
Glucose-Capillary: 239 mg/dL — ABNORMAL HIGH (ref 65–99)
Glucose-Capillary: 122 mg/dL — ABNORMAL HIGH (ref 65–99)
Glucose-Capillary: 250 mg/dL — ABNORMAL HIGH (ref 65–99)

## 2018-01-14 LAB — BASIC METABOLIC PANEL
Anion gap: 7 (ref 5–15)
BUN: 26 mg/dL — AB (ref 6–20)
CALCIUM: 8.1 mg/dL — AB (ref 8.9–10.3)
CO2: 24 mmol/L (ref 22–32)
Chloride: 108 mmol/L (ref 101–111)
Creatinine, Ser: 1.75 mg/dL — ABNORMAL HIGH (ref 0.44–1.00)
GFR calc Af Amer: 31 mL/min — ABNORMAL LOW (ref >60)
GFR, EST NON AFRICAN AMERICAN: 27 mL/min — AB (ref >60)
Glucose, Bld: 135 mg/dL — ABNORMAL HIGH (ref 65–99)
POTASSIUM: 4.1 mmol/L (ref 3.5–5.1)
SODIUM: 139 mmol/L (ref 135–145)

## 2018-01-14 LAB — PROTIME-INR
INR: 1.7
Prothrombin Time: 19.8 seconds — ABNORMAL HIGH (ref 11.4–15.2)

## 2018-01-14 MED ORDER — LOPERAMIDE HCL 2 MG PO CAPS
4.0000 mg | ORAL_CAPSULE | Freq: Four times a day (QID) | ORAL | Status: DC
  Administered 2018-01-14 – 2018-01-15 (×4): 4 mg via ORAL
  Filled 2018-01-14 (×4): qty 2

## 2018-01-14 MED ORDER — CITALOPRAM HYDROBROMIDE 20 MG PO TABS
20.0000 mg | ORAL_TABLET | Freq: Every day | ORAL | Status: DC
  Administered 2018-01-15: 20 mg via ORAL
  Filled 2018-01-14: qty 1

## 2018-01-14 MED ORDER — WARFARIN SODIUM 5 MG PO TABS
5.0000 mg | ORAL_TABLET | Freq: Once | ORAL | Status: AC
  Administered 2018-01-14: 5 mg via ORAL
  Filled 2018-01-14: qty 1

## 2018-01-14 MED ORDER — SODIUM CHLORIDE 0.9 % IV SOLN
3.0000 g | Freq: Two times a day (BID) | INTRAVENOUS | Status: DC
  Administered 2018-01-14 – 2018-01-15 (×2): 3 g via INTRAVENOUS
  Filled 2018-01-14 (×3): qty 3

## 2018-01-14 MED ORDER — WARFARIN - PHARMACIST DOSING INPATIENT: Freq: Every day | Status: DC

## 2018-01-14 NOTE — Care Management (Signed)
Chart reviewed for discharge needs. PT recommending home health PT. Patient goes to adult day care 5 days a week. attempted to reach daughter at multiple numbers without success. RNCM will follow up.

## 2018-01-14 NOTE — Progress Notes (Signed)
ANTICOAGULATION CONSULT NOTE   Pharmacy Consult for warfarin dosing Indication: atrial fibrillation  No Known Allergies  Patient Measurements: Height: 5' (152.4 cm) Weight: 136 lb 12.8 oz (62.1 kg) IBW/kg (Calculated) : 45.5 Heparin Dosing Weight: n/a  Vital Signs: Temp: 97.7 F (36.5 C) (03/25 0530) Temp Source: Oral (03/25 0530) BP: 126/30 (03/25 0940) Pulse Rate: 53 (03/25 0940)  Labs: Recent Labs    01/11/18 1521 01/12/18 0245 01/12/18 0612 01/13/18 0602 01/14/18 0452  HGB 15.9 13.4  --   --   --   HCT 46.8 39.7  --   --   --   PLT 246 214  --   --   --   LABPROT  --   --  21.5* 19.2* 19.8*  INR  --   --  1.89 1.63 1.70  CREATININE 1.52* 1.83*  --   --  1.75*    Estimated Creatinine Clearance: 22.5 mL/min (A) (by C-G formula based on SCr of 1.75 mg/dL (H)).   Medical History: Past Medical History:  Diagnosis Date  . Atrial fibrillation ([place])   . Congestive heart failure (CHF) ([place])   . Diabetes mellitus without complication ([place])   . GERD (gastroesophageal reflux disease)   . Hyperlipidemia   . Hypertension     Medications:  Home dose: Warfarin 4 mg Su, M, W, Th, Sa Warfarin 6 mg Tu, F  Assessment: Pharmacy consulted for Warfarin dosing in 77 yo female with PMH of A. Fib. INR slightly subtherapeutic on admission  DATE INR DOSE 3/23 1.89 4mg  3/24 1.63 4mg  3/25 1.70   Goal of Therapy:  INR 2-3   Plan:  3/24  INR = 1.70 -remains subtherapeutic.   Will order warfarin 5mg  x 1 dose tonight.  Daily INR while on antibiotics per policy.  Pernell Dupre, PharmD, BCPS Clinical Pharmacist 01/14/2018 10:51 AM

## 2018-01-14 NOTE — Progress Notes (Signed)
[facility] [place] NAME: [name]    MR#:  [id number]  DATE OF BIRTH:  Jun 15, 1941  SUBJECTIVE:   Continues to have abdominal pain and diarrhea stool for C. difficile negative REVIEW OF SYSTEMS:    Review of Systems  Constitutional: Negative for fever, chills weight loss HENT: Negative for ear pain, nosebleeds, congestion, facial swelling, rhinorrhea, neck pain, neck stiffness and ear discharge.   Respiratory: Negative for cough, shortness of breath, wheezing  Cardiovascular: Negative for chest pain, palpitations and leg swelling.  Gastrointestinal: Negative for heartburn, ++abdominal pain, NO vomiting, diarrhea or consitpation Genitourinary: Negative for dysuria, urgency, frequency, hematuria Musculoskeletal: Negative for back pain or joint pain Neurological: Negative for dizziness, seizures, syncope, focal weakness,  numbness and headaches.  Hematological: Does not bruise/bleed easily.  Psychiatric/Behavioral: Negative for hallucinations, confusion, dysphoric mood    Tolerating Diet: yes      DRUG ALLERGIES:  No Known Allergies  VITALS:  Blood pressure 124/85, pulse 70, temperature 97.7 F (36.5 C), temperature source Oral, resp. rate 18, height 5' (1.524 m), weight 136 lb 12.8 oz (62.1 kg), SpO2 98 %.  PHYSICAL EXAMINATION:  Constitutional: Appears well-developed and well-nourished. No distress. HENT: Normocephalic. Marland Kitchen Oropharynx is clear and moist.  Eyes: Conjunctivae and EOM are normal. PERRLA, no scleral icterus.  Neck: Normal ROM. Neck supple. No JVD. No tracheal deviation. CVS: Irr, irr S1/S2 +, 2/6 SEM, no gallops, no carotid bruit.  Pulmonary: Effort and breath sounds normal, no stridor, rhonchi, wheezes, rales.  Abdominal: Soft. BS +,  no distension,++ generalized tenderness, NO rebound or guarding.  Musculoskeletal: Normal range of motion. No edema and no tenderness.  Neuro: Alert. CN 2-12 grossly intact. No  focal deficits. Skin: Skin is warm and dry. No rash noted. Psychiatric: Normal mood and affect.      LABORATORY PANEL:   CBC Recent Labs  Lab 01/12/18 0245  WBC 13.9*  HGB 13.4  HCT 39.7  PLT 214   ------------------------------------------------------------------------------------------------------------------  Chemistries  Recent Labs  Lab 01/11/18 1521  01/14/18 0452  NA 136   < > 139  K 4.8   < > 4.1  CL 97*   < > 108  CO2 25   < > 24  GLUCOSE 271*   < > 135*  BUN 22*   < > 26*  CREATININE 1.52*   < > 1.75*  CALCIUM 9.4   < > 8.1*  AST 37  --   --   ALT 12*  --   --   ALKPHOS 91  --   --   BILITOT 1.3*  --   --    < > = values in this interval not displayed.   ------------------------------------------------------------------------------------------------------------------  Cardiac Enzymes No results for input(s): TROPONINI in the last 168 hours. ------------------------------------------------------------------------------------------------------------------  RADIOLOGY:  No results found.   ASSESSMENT AND PLAN:   77 year old female with history of diabetes, chronic kidney disease stage IV, chronic combined systolic and diastolic heart failure who presents to emergency room due to abdominal discomfort, vomiting and diarrhea.  1.  Acute rectosigmoid colitis: DC Zosyn changed to Unasyn Oral antibiotics likely tomorrow Add Imodium for persistent diarrhea Stool for C. difficile negative   2.  Chronic combined systolic and diastolic heart failure without signs of exacerbation  3.  PAF: Continue Coumadin and digoxin  4.  Diabetes: Continue sliding scale  5.  Hyperlipidemia: Continue statin  6.  Chronic renal failure monitor renal function  Management plans discussed with the patient and she is in agreement.  CODE STATUS: dnr  TOTAL TIME TAKING CARE OF THIS PATIENT: 27 minutes.     POSSIBLE D/C tomorrow DEPENDING ON CLINICAL  CONDITION.   Dustin Flock M.D on 01/14/2018 at 4:13 PM  Between 7am to 6pm - Pager - [phone] After 6pm go to [email or web address] - password [email or web address] [facility]  Office  [phone]  CC: Primary care physician; Lavera Guise, MD  Note: This dictation was prepared with Dragon dictation along with smaller phrase technology. Any transcriptional errors that result from this process are unintentional.

## 2018-01-14 NOTE — Progress Notes (Signed)
Chaplain completed AD education. Patient is visually impaired, but can informed decisions about her health care choices. A conversation with the daughter and notary are required to proceed.

## 2018-01-14 NOTE — Evaluation (Signed)
Physical Therapy Evaluation Patient Details Name: [name] MRN: [id number] DOB: 1941/05/03 Today's Date: 01/14/2018   History of Present Illness  Pt is a 77 year old female with history of diabetes,chronic kidney disease stage IV,chronic combined systolic and diastolic heart failure who presents to emergency room due to abdominal discomfort, vomiting and diarrhea.  Assessment includes: Acute rectosigmoid colitis, chronic combined systolic and diastolic heart failure without signs of exacerbation, PAF, DM, and HLD.      Clinical Impression  Pt presents with deficits in strength, transfers, mobility, gait, balance, and activity tolerance.  Pt required extra time and effort with bed mobility tasks but no physical assistance.  Pt was CGA with transfers and c/o weakness in her BLEs while in standing and did present with mildly tremulous BLEs.  Pt was able to amb 60' with CGA and a RW with slow cadence and short B step length with mildly tremulous BLEs that improved somewhat as session progressed. Pt's SpO2 and HR remained WNL during session.  Pt will benefit from HHPT services upon discharge to safely address above deficits for decreased caregiver assistance and eventual return to PLOF.       Follow Up Recommendations Home health PT    Equipment Recommendations  Rolling walker with 5" wheels    Recommendations for Other Services       Precautions / Restrictions Precautions Precautions: Fall Restrictions Weight Bearing Restrictions: No      Mobility  Bed Mobility Overal bed mobility: Modified Independent             General bed mobility comments: Extra time and effort required during bed mobility tasks  Transfers Overall transfer level: Needs assistance Equipment used: Rolling walker (2 wheeled) Transfers: Sit to/from Stand Sit to Stand: Min guard         General transfer comment: Upon standing pt presented with mildly tremulous BLEs but was steady without  LOB  Ambulation/Gait Ambulation/Gait assistance: Min guard Ambulation Distance (Feet): 60 Feet Assistive device: Rolling walker (2 wheeled) Gait Pattern/deviations: Step-through pattern;Decreased step length - right;Decreased step length - left   Gait velocity interpretation: Below normal speed for age/gender General Gait Details: Slow cadence with amb with short B step length with mildly tremulous BLEs that improved somewhat as session progressed.   Stairs            Wheelchair Mobility    Modified Rankin (Stroke Patients Only)       Balance Overall balance assessment: Needs assistance   Sitting balance-Leahy Scale: Normal     Standing balance support: Bilateral upper extremity supported Standing balance-Leahy Scale: Good                               Pertinent Vitals/Pain Pain Assessment: No/denies pain    Home Living Family/patient expects to be discharged to:: Private residence Living Arrangements: Children(Lives with daughter) Available Help at Discharge: Family;Other (Comment);Available 24 hours/day(Pt attends [facility] 5 days/wk) Type of Home: Mobile home Home Access: Ramped entrance     Home Layout: One level Home Equipment: None      Prior Function Level of Independence: Independent         Comments: Pt Ind with amb without AD limited community distances, assists with passing out meals at adult day care, exercises at adult day care 4 days/wk, Ind with ADLs, no fall history     Hand Dominance        Extremity/Trunk Assessment  Lower Extremity Assessment Lower Extremity Assessment: Generalized weakness       Communication   Communication: No difficulties  Cognition Arousal/Alertness: Awake/alert Behavior During Therapy: WFL for tasks assessed/performed Overall Cognitive Status: Within Functional Limits for tasks assessed                                        General Comments       Exercises Total Joint Exercises Ankle Circles/Pumps: AROM;5 reps;10 reps Quad Sets: Strengthening;Both;10 reps Gluteal Sets: Strengthening;Both;10 reps Heel Slides: AROM;Both;10 reps Hip ABduction/ADduction: AROM;Both;10 reps Straight Leg Raises: AROM;Both;10 reps Long Arc Quad: AROM;Both;10 reps Knee Flexion: AROM;Both;10 reps Marching in Standing: AROM;Both;10 reps;Standing Other Exercises Other Exercises: HEP education for BLE APs, QS, GS, and LAQs x 10 each 5-6x/day while in acute care   Assessment/Plan    PT Assessment Patient needs continued PT services  PT Problem List Decreased strength;Decreased activity tolerance;Decreased balance;Decreased mobility;Decreased knowledge of use of DME       PT Treatment Interventions DME instruction;Gait training;Functional mobility training;Balance training;Patient/family education;Therapeutic activities;Therapeutic exercise    PT Goals (Current goals can be found in the Care Plan section)  Acute Rehab PT Goals Patient Stated Goal: To get my strength back PT Goal Formulation: With patient Time For Goal Achievement: 01/27/18 Potential to Achieve Goals: Good    Frequency Min 2X/week   Barriers to discharge        Co-evaluation               AM-PAC PT "6 Clicks" Daily Activity  Outcome Measure Difficulty turning over in bed (including adjusting bedclothes, sheets and blankets)?: A Little Difficulty moving from lying on back to sitting on the side of the bed? : A Little Difficulty sitting down on and standing up from a chair with arms (e.g., wheelchair, bedside commode, etc,.)?: Unable Help needed moving to and from a bed to chair (including a wheelchair)?: A Little Help needed walking in hospital room?: A Little Help needed climbing 3-5 steps with a railing? : A Little 6 Click Score: 16    End of Session Equipment Utilized During Treatment: Gait belt Activity Tolerance: Patient tolerated treatment well Patient  left: in bed;with bed alarm set;with call bell/phone within reach Nurse Communication: Mobility status PT Visit Diagnosis: Muscle weakness (generalized) (M62.81);Difficulty in walking, not elsewhere classified (R26.2)    Time: 8828-0034 PT Time Calculation (min) (ACUTE ONLY): 22 min   Charges:   PT Evaluation $PT Eval Low Complexity: 1 Low PT Treatments $Therapeutic Exercise: 8-22 mins   PT G Codes:        DRoyetta Asal PT, DPT 01/14/18, 12:10 PM

## 2018-01-15 LAB — GLUCOSE, CAPILLARY
Glucose-Capillary: 155 mg/dL — ABNORMAL HIGH (ref 65–99)
Glucose-Capillary: 218 mg/dL — ABNORMAL HIGH (ref 65–99)

## 2018-01-15 LAB — PROTIME-INR
INR: 1.71
Prothrombin Time: 19.9 seconds — ABNORMAL HIGH (ref 11.4–15.2)

## 2018-01-15 MED ORDER — LOPERAMIDE HCL 2 MG PO CAPS: 4.0000 mg | ORAL_CAPSULE | Freq: Four times a day (QID) | ORAL | 0 refills | Status: AC

## 2018-01-15 MED ORDER — WARFARIN SODIUM 6 MG PO TABS
6.0000 mg | ORAL_TABLET | Freq: Once | ORAL | Status: DC
  Filled 2018-01-15: qty 1

## 2018-01-15 MED ORDER — AMOXICILLIN-POT CLAVULANATE 875-125 MG PO TABS: 1.0000 | ORAL_TABLET | Freq: Two times a day (BID) | ORAL | 0 refills | Status: AC

## 2018-01-15 NOTE — Progress Notes (Signed)
[facility]  Patient will be at: Address: [address] [place], [place] [address]  Phone: [phone]  Monday, Tuesday, Thursday, Friday, home on Wednesdays     [name] 01/15/2018, 1:14 PM

## 2018-01-15 NOTE — Discharge Instructions (Addendum)
[facility] [place] ° °DIET:  °Cardiac diet ° °DISCHARGE CONDITION:  °Stable ° °ACTIVITY:  °Activity as tolerated ° °OXYGEN:  °Home Oxygen: No. °  °Oxygen Delivery: room air ° °DISCHARGE LOCATION:  °home  ° ° °ADDITIONAL DISCHARGE INSTRUCTION: ° ° °If you experience worsening of your admission symptoms, develop shortness of breath, life threatening emergency, suicidal or homicidal thoughts you must seek medical attention immediately by calling 911 or calling your MD immediately  if symptoms less severe. ° °You Must read complete instructions/literature along with all the possible adverse reactions/side effects for all the Medicines you take and that have been prescribed to you. Take any new Medicines after you have completely understood and accpet all the possible adverse reactions/side effects.  ° °Please note ° °You were cared for by a hospitalist during your hospital stay. If you have any questions about your discharge medications or the care you received while you were in the hospital after you are discharged, you can call the unit and asked to speak with the hospitalist on call if the hospitalist that took care of you is not available. Once you are discharged, your primary care physician will handle any further medical issues. Please note that NO REFILLS for any discharge medications will be authorized once you are discharged, as it is imperative that you return to your primary care physician (or establish a relationship with a primary care physician if you do not have one) for your aftercare needs so that they can reassess your need for medications and monitor your lab values. ° ° °

## 2018-01-15 NOTE — Care Management Important Message (Signed)
Important Message  Patient Details  Name: [name] MRN: [id number] Date of Birth: [birth year]   Medicare Important Message Given:  Yes    Beverly Sessions, RN 01/15/2018, 11:22 AM

## 2018-01-15 NOTE — Care Management Note (Signed)
Case Management Note  Patient Details  Name: [name] MRN: [id number] Date of Birth: 03-06-1941   Patient lives at home with daughter. Patient states that she does to [place] Mondays, Tuesdays, Thursdays, and Fridays.  PT has assessed patient and agreeable to home health.  Patient states that she does not have a preference of home health agency.  Referral made to [facility] with [place]. RW to be delivered to room prior to discharge.  RNCM signing off.   Subjective/Objective:                    Action/Plan:   Expected Discharge Date:  01/15/18               Expected Discharge Plan:  [place]  In-House Referral:     Discharge planning Services  CM Consult  Post Acute Care Choice:  Home Health, Durable Medical Equipment Choice offered to:  Patient  DME Arranged:  Walker rolling DME Agency:  [facility] Arranged:  PT, Nurse's Aide [place] Agency:  [place]  Status of Service:  Completed, signed off  If discussed at [place] of Stay Meetings, dates discussed:    Additional Comments:  Beverly Sessions, RN 01/15/2018, 1:35 PM

## 2018-01-15 NOTE — Discharge Summary (Signed)
[place] [facility], 77 y.o., DOB 1941/05/06, MRN [id number]. Admission date: 01/11/2018 Discharge Date 01/15/2018 Primary MD Lavera Guise, MD Admitting Physician Lance Coon, MD  Admission Diagnosis  Colitis [K52.9] Sepsis, due to unspecified organism [facility]) [A41.9]  Discharge Diagnosis   Principal Problem: Sepsis due to colitis Acute rectosigmoid colitis Combined chronic systolic and diastolic CHF PAF Diabetes type 2 Hyperlipidemia Chronic renal failure      Hospital Course  77 year old female with history of diabetes, chronic kidney disease stage IV, chronic combined systolic and diastolic heart failure who presents to emergency room due to abdominal discomfort, vomiting and diarrhea.  Patient was noted to have rectosigmoid colitis and was admitted and started on antibiotics.  With antibiotics treatment her symptoms resolved.  Stool was negative for C. difficile.  Patient was seen by PT recommended home health PT               Consults  None  Significant Tests:  See full reports for all details    Ct Renal Stone Study  Result Date: 01/11/2018 CLINICAL DATA:  Diarrhea and vomiting since 1200 hours, lower abdominal pain, history atrial fibrillation, CHF, diabetes mellitus, hypertension, smoker EXAM: CT ABDOMEN AND PELVIS WITHOUT CONTRAST TECHNIQUE: Multidetector CT imaging of the abdomen and pelvis was performed following the standard protocol without IV contrast. COMPARISON:  03/02/2014 FINDINGS: Lower chest: Emphysematous changes and minimal parenchymal scarring at both lung bases. Hepatobiliary: Gallbladder surgically absent. No focal hepatic abnormalities. Pancreas: Normal appearance Spleen: Normal appearance Adrenals/Urinary Tract: Thickening of adrenal glands without discrete mass. Absent RIGHT kidney. Tiny nonobstructing LEFT renal calculi. No hydronephrosis or LEFT ureteral dilatation. Multiple tiny calcifications  are seen in the LEFT pelvis at/near the distal LEFT ureter, which is not well localized near the bladder, grossly unchanged. No definite ureteral calcification. Bladder unremarkable. Stomach/Bowel: Appendix surgically absent by history. Wall thickening of the rectosigmoid colon question colitis. Stomach under distended unable to exclude gastric wall thickening. Small bowel loops unremarkable. Vascular/Lymphatic: Extensive atherosclerotic calcifications aorta and iliac arteries. Numerous pelvic phleboliths. Reproductive: Uterus surgically absent. Nonvisualization of RIGHT ovary. LEFT ovary normal appearance. Other: No free air or free fluid. No hernia or acute inflammatory process. Small calcification anterior to the bladder is unchanged. Calcifications in RIGHT pelvis in a long linear array appear grossly stable. Musculoskeletal: Osseous demineralization with scattered degenerative disc disease changes of the thoracolumbar spine. IMPRESSION: Wall thickening of the rectosigmoid colon consistent with colitis, consider infection and inflammatory bowel disease. Tiny nonobstructing LEFT renal calculi. Absent RIGHT kidney. Aortic Atherosclerosis (ICD10-I70.0) and Emphysema (ICD10-J43.9). Electronically Signed   By: Lavonia Dana M.D.   On: 01/11/2018 19:39       Today   Subjective:   [name] feeling much better wants to go home Objective:   Blood pressure (!) 153/44, pulse (!) 50, temperature 98 F (36.7 C), temperature source Oral, resp. rate 16, height 5' (1.524 m), weight 136 lb 12.8 oz (62.1 kg), SpO2 98 %.  .  Intake/Output Summary (Last 24 hours) at 01/15/2018 1645 Last data filed at 01/15/2018 1442 Gross per 24 hour  Intake 2425.75 ml  Output 1000 ml  Net 1425.75 ml    Exam VITAL SIGNS: Blood pressure (!) 153/44, pulse (!) 50, temperature 98 F (36.7 C), temperature source Oral, resp. rate 16, height 5' (1.524 m), weight 136 lb 12.8 oz (62.1 kg), SpO2 98 %.  GENERAL:  77 y.o.-year-old  patient lying in the bed with no  acute distress.  EYES: Pupils equal, round, reactive to light and accommodation. No scleral icterus. Extraocular muscles intact.  HEENT: Head atraumatic, normocephalic. Oropharynx and nasopharynx clear.  NECK:  Supple, no jugular venous distention. No thyroid enlargement, no tenderness.  LUNGS: Normal breath sounds bilaterally, no wheezing, rales,rhonchi or crepitation. No use of accessory muscles of respiration.  CARDIOVASCULAR: S1, S2 normal. No murmurs, rubs, or gallops.  ABDOMEN: Soft, nontender, nondistended. Bowel sounds present. No organomegaly or mass.  EXTREMITIES: No pedal edema, cyanosis, or clubbing.  NEUROLOGIC: Cranial nerves II through XII are intact. Muscle strength 5/5 in all extremities. Sensation intact. Gait not checked.  PSYCHIATRIC: The patient is alert and oriented x 3.  SKIN: No obvious rash, lesion, or ulcer.   Data Review     CBC w Diff:  Lab Results  Component Value Date   WBC 13.9 (H) 01/12/2018   HGB 13.4 01/12/2018   HGB 14.2 10/31/2017   HCT 39.7 01/12/2018   HCT 41.9 10/31/2017   PLT 214 01/12/2018   PLT 217 10/31/2017   LYMPHOPCT 32 11/26/2016   LYMPHOPCT 34.3 04/09/2013   MONOPCT 6 11/26/2016   MONOPCT 6.2 04/09/2013   EOSPCT 2 11/26/2016   EOSPCT 2.0 04/09/2013   BASOPCT 1 11/26/2016   BASOPCT 0.7 04/09/2013   CMP:  Lab Results  Component Value Date   NA 139 01/14/2018   NA 138 10/31/2017   NA 137 07/03/2014   K 4.1 01/14/2018   K 3.7 07/03/2014   CL 108 01/14/2018   CL 100 07/03/2014   CO2 24 01/14/2018   CO2 29 07/03/2014   BUN 26 (H) 01/14/2018   BUN 19 10/31/2017   BUN 29 (H) 07/03/2014   CREATININE 1.75 (H) 01/14/2018   CREATININE 1.67 (H) 07/03/2014   PROT 8.1 01/11/2018   PROT 6.7 10/31/2017   PROT 7.8 11/16/2013   ALBUMIN 3.8 01/11/2018   ALBUMIN 3.6 10/31/2017   ALBUMIN 2.9 (L) 11/16/2013   BILITOT 1.3 (H) 01/11/2018   BILITOT 0.3 10/31/2017   BILITOT 0.5 11/16/2013   ALKPHOS 91  01/11/2018   ALKPHOS 82 11/16/2013   AST 37 01/11/2018   AST 22 11/16/2013   ALT 12 (L) 01/11/2018   ALT 13 11/16/2013  .  Micro Results Recent Results (from the past 240 hour(s))  Urine culture     Status: Abnormal   Collection Time: 01/11/18  7:22 PM  Result Value Ref Range Status   Specimen Description   Final    URINE, RANDOM Performed at [facility], [address] [place], [place] [address]    Special Requests   Final    NONE Performed at [facility], [address] [place], [place] [address]    Culture MULTIPLE SPECIES PRESENT, SUGGEST RECOLLECTION (A)  Final   Report Status 01/13/2018 FINAL  Final  Blood Culture (routine x 2)     Status: None (Preliminary result)   Collection Time: 01/11/18  8:47 PM  Result Value Ref Range Status   Specimen Description BLOOD RIGHT ANTECUBITAL  Final   Special Requests   Final    BOTTLES DRAWN AEROBIC AND ANAEROBIC Blood Culture adequate volume   Culture   Final    NO GROWTH 4 DAYS Performed at [facility], [address], [place], [place] [address]    Report Status PENDING  Incomplete  Blood Culture (routine x 2)     Status: None (Preliminary result)   Collection Time: 01/11/18  8:47 PM  Result Value  Ref Range Status   Specimen Description BLOOD RIGHT HAND  Final   Special Requests   Final    BOTTLES DRAWN AEROBIC AND ANAEROBIC Blood Culture adequate volume   Culture   Final    NO GROWTH 4 DAYS Performed at [facility], [address] [place], [place] [address]    Report Status PENDING  Incomplete  C difficile quick scan w PCR reflex     Status: None   Collection Time: 01/12/18 11:34 AM  Result Value Ref Range Status   C Diff antigen NEGATIVE NEGATIVE Final   C Diff toxin NEGATIVE NEGATIVE Final   C Diff interpretation No C. difficile detected.  Final    Comment: Performed at [facility], [address] [place], [place] [address]        Code Status Orders   (From admission, onward)        Start     Ordered   01/12/18 1215  Do not attempt resuscitation (DNR)  Continuous    Question Answer Comment  In the event of cardiac or respiratory ARREST Do not call a "code blue"   In the event of cardiac or respiratory ARREST Do not perform Intubation, CPR, defibrillation or ACLS   In the event of cardiac or respiratory ARREST Use medication by any route, position, wound care, and other measures to relive pain and suffering. May use oxygen, suction and manual treatment of airway obstruction as needed for comfort.   Comments chap      01/12/18 1214    Code Status History    Date Active Date Inactive Code Status Order ID Comments User Context   01/12/2018 0210 01/12/2018 1214 Full Code [id number]  Lance Coon, MD Inpatient    Advance Directive Documentation     Most Recent Value  Type of Advance Directive  Healthcare Power of Attorney, Living will  Pre-existing out of facility DNR order (yellow form or pink MOST form)  -  "MOST" Form in Place?  -          Follow-up Information    Lavera Guise, MD. Go on 01/23/2018.   Specialty:  Internal Medicine Why:  Wednesday at 9:45am for hospital follow-up Contact information: [address] [place]  [id number] [phone]           Discharge Medications   Allergies as of 01/15/2018   No Known Allergies     Medication List    TAKE these medications   ACCU-CHEK FASTCLIX LANCETS Misc   amoxicillin-clavulanate 875-125 MG tablet Commonly known as:  AUGMENTIN Take 1 tablet by mouth 2 (two) times daily for 5 days.   atorvastatin 10 MG tablet Commonly known as:  LIPITOR Take 1 tablet (10 mg total) by mouth every evening.   B-D SINGLE USE SWABS REGULAR Pads   BD PEN NEEDLE NANO U/F 32G X 4 MM Misc Generic drug:  Insulin Pen Needle   citalopram 40 MG tablet Commonly known as:  CELEXA Take 1 tablet (40 mg total) by mouth daily.   digoxin 0.125 MG tablet Commonly known as:   LANOXIN Take 125 mcg by mouth daily.   donepezil 5 MG tablet Commonly known as:  ARICEPT Take 5 mg by mouth daily.   furosemide 20 MG tablet Commonly known as:  LASIX Take 20 mg by mouth daily.   ACCU-CHEK GUIDE test strip Generic drug:  glucose blood   glucose blood test strip Use as instructed to check blood sugars twice a day.  E11.65   liraglutide 18 MG/3ML Sopn Commonly known as:  VICTOZA Inject 0.2 mLs (1.2 mg total) into the skin daily.   loperamide 2 MG capsule Commonly known as:  IMODIUM Take 2 capsules (4 mg total) by mouth every 6 (six) hours for 2 days.   pantoprazole 40 MG tablet Commonly known as:  PROTONIX Take 40 mg by mouth 2 (two) times daily.   rOPINIRole 0.5 MG tablet Commonly known as:  REQUIP Take 1 tablet (0.5 mg total) by mouth every evening.   TOUJEO SOLOSTAR 300 UNIT/ML Sopn Generic drug:  Insulin Glargine Inject 20 mg into the skin daily.   traMADol 50 MG tablet Commonly known as:  ULTRAM Take 1 tablet (50 mg total) by mouth 2 (two) times daily.   warfarin 4 MG tablet Commonly known as:  COUMADIN Take 4 mg by mouth See admin instructions. tk 1.5 tabs on Tuesday and Friday, and 1 tablet on the rest of the days            Durable Medical Equipment  (From admission, onward)        Start     Ordered   01/15/18 1125  For home use only DME Walker rolling  Once    Question:  Patient needs a walker to treat with the following condition  Answer:  Unstable gait   01/15/18 1124         Total Time in preparing paper work, data evaluation and todays exam - 80 minutes  Dustin Flock M.D on 01/15/2018 at [place]  [phone]

## 2018-01-15 NOTE — Progress Notes (Signed)
[name]  A and O x 4. VSS. Pt tolerating diet well. No complaints of pain or nausea. IV removed intact, prescriptions given. Pt voiced understanding of discharge instructions with no further questions. Pt discharged via wheelchair with axillary.    Allergies as of 01/15/2018   No Known Allergies     Medication List    TAKE these medications   ACCU-CHEK FASTCLIX LANCETS Misc   amoxicillin-clavulanate 875-125 MG tablet Commonly known as:  AUGMENTIN Take 1 tablet by mouth 2 (two) times daily for 5 days.   atorvastatin 10 MG tablet Commonly known as:  LIPITOR Take 1 tablet (10 mg total) by mouth every evening.   B-D SINGLE USE SWABS REGULAR Pads   BD PEN NEEDLE NANO U/F 32G X 4 MM Misc Generic drug:  Insulin Pen Needle   citalopram 40 MG tablet Commonly known as:  CELEXA Take 1 tablet (40 mg total) by mouth daily.   digoxin 0.125 MG tablet Commonly known as:  LANOXIN Take 125 mcg by mouth daily.   donepezil 5 MG tablet Commonly known as:  ARICEPT Take 5 mg by mouth daily.   furosemide 20 MG tablet Commonly known as:  LASIX Take 20 mg by mouth daily.   ACCU-CHEK GUIDE test strip Generic drug:  glucose blood   glucose blood test strip Use as instructed to check blood sugars twice a day.  E11.65   liraglutide 18 MG/3ML Sopn Commonly known as:  VICTOZA Inject 0.2 mLs (1.2 mg total) into the skin daily.   loperamide 2 MG capsule Commonly known as:  IMODIUM Take 2 capsules (4 mg total) by mouth every 6 (six) hours for 2 days.   pantoprazole 40 MG tablet Commonly known as:  PROTONIX Take 40 mg by mouth 2 (two) times daily.   rOPINIRole 0.5 MG tablet Commonly known as:  REQUIP Take 1 tablet (0.5 mg total) by mouth every evening.   TOUJEO SOLOSTAR 300 UNIT/ML Sopn Generic drug:  Insulin Glargine Inject 20 mg into the skin daily.   traMADol 50 MG tablet Commonly known as:  ULTRAM Take 1 tablet (50 mg total) by mouth 2 (two) times daily.   warfarin 4 MG  tablet Commonly known as:  COUMADIN Take 4 mg by mouth See admin instructions. tk 1.5 tabs on Tuesday and Friday, and 1 tablet on the rest of the days            Durable Medical Equipment  (From admission, onward)        Start     Ordered   01/15/18 1125  For home use only DME Walker rolling  Once    Question:  Patient needs a walker to treat with the following condition  Answer:  Unstable gait   01/15/18 1124      Vitals:   01/15/18 0902 01/15/18 1156  BP:  (!) 153/44  Pulse: (!) 52 (!) 50  Resp:  16  Temp:  98 F (36.7 C)  SpO2:  98%    [name]

## 2018-01-15 NOTE — Progress Notes (Signed)
[place] for warfarin dosing Indication: atrial fibrillation  No Known Allergies  Patient Measurements: Height: 5' (152.4 cm) Weight: 136 lb 12.8 oz (62.1 kg) IBW/kg (Calculated) : 45.5 Heparin Dosing Weight: n/a  Vital Signs: Temp: 98.2 F (36.8 C) (03/26 0443) Temp Source: Oral (03/25 2002) BP: 126/65 (03/26 0443) Pulse Rate: 64 (03/26 0443)  Labs: Recent Labs    01/13/18 0602 01/14/18 0452 01/15/18 0600  LABPROT 19.2* 19.8* 19.9*  INR 1.63 1.70 1.71  CREATININE  --  1.75*  --     Estimated Creatinine Clearance: 22.5 mL/min (A) (by C-G formula based on SCr of 1.75 mg/dL (H)).   Medical History: Past Medical History:  Diagnosis Date  . Atrial fibrillation ([place])   . Congestive heart failure (CHF) ([place])   . Diabetes mellitus without complication ([place])   . GERD (gastroesophageal reflux disease)   . Hyperlipidemia   . Hypertension     Medications:  Home dose: Warfarin 4 mg Su, M, W, Th, Sa Warfarin 6 mg Tu, F  Assessment: Pharmacy consulted for Warfarin dosing in 77 yo female with PMH of A. Fib. INR slightly subtherapeutic on admission  DATE INR DOSE 3/23 1.89 4mg  3/24 1.63 4mg  3/25 1.70 5mg  3/26 1.71  Goal of Therapy:  INR 2-3   Plan:  3/24  INR = 1.71 -remains subtherapeutic.   Will order warfarin 6mg  x 1 dose tonight.  Daily INR while on antibiotics per policy.  Pernell Dupre, PharmD, BCPS Clinical Pharmacist 01/15/2018 7:37 AM

## 2018-01-16 LAB — CULTURE, BLOOD (ROUTINE X 2)
CULTURE: NO GROWTH
Culture: NO GROWTH
SPECIAL REQUESTS: ADEQUATE
Special Requests: ADEQUATE

## 2018-01-23 ENCOUNTER — Encounter: Payer: Self-pay | Admitting: Internal Medicine

## 2018-01-23 ENCOUNTER — Ambulatory Visit (INDEPENDENT_AMBULATORY_CARE_PROVIDER_SITE_OTHER): Payer: Medicare HMO | Admitting: Internal Medicine

## 2018-01-23 VITALS — BP 129/48 | HR 67 | Resp 16 | Wt 139.2 lb

## 2018-01-23 DIAGNOSIS — I480 Paroxysmal atrial fibrillation: Secondary | ICD-10-CM | POA: Diagnosis not present

## 2018-01-23 DIAGNOSIS — K529 Noninfective gastroenteritis and colitis, unspecified: Secondary | ICD-10-CM | POA: Diagnosis not present

## 2018-01-23 DIAGNOSIS — J440 Chronic obstructive pulmonary disease with acute lower respiratory infection: Secondary | ICD-10-CM

## 2018-01-23 DIAGNOSIS — N183 Chronic kidney disease, stage 3 unspecified: Secondary | ICD-10-CM

## 2018-01-23 DIAGNOSIS — I10 Essential (primary) hypertension: Secondary | ICD-10-CM

## 2018-01-23 DIAGNOSIS — I4891 Unspecified atrial fibrillation: Secondary | ICD-10-CM | POA: Diagnosis not present

## 2018-01-23 DIAGNOSIS — E1129 Type 2 diabetes mellitus with other diabetic kidney complication: Secondary | ICD-10-CM

## 2018-01-23 DIAGNOSIS — J209 Acute bronchitis, unspecified: Secondary | ICD-10-CM

## 2018-01-23 DIAGNOSIS — R809 Proteinuria, unspecified: Secondary | ICD-10-CM

## 2018-01-23 LAB — POCT INR

## 2018-01-23 MED ORDER — IPRATROPIUM-ALBUTEROL 0.5-2.5 (3) MG/3ML IN SOLN
3.0000 mL | Freq: Once | RESPIRATORY_TRACT | Status: AC
  Administered 2018-01-23: 3 mL via RESPIRATORY_TRACT

## 2018-01-23 MED ORDER — AZITHROMYCIN 250 MG PO TABS: ORAL_TABLET | ORAL | 0 refills | Status: DC

## 2018-01-23 NOTE — Progress Notes (Signed)
[facility] [place], [place] [address]  Internal MEDICINE  Office Visit Note  Patient Name: [name]  [id number]  [id number]  Date of Service: 02/06/2018     Chief Complaint  Patient presents with  . Hospitalization Follow-up    colitis  . Diabetes   HPI Pt is here for recent hospital follow up. Pt was hospitalized for Colitis with fever and diarrhea. She was IV abx for 3 days. C/O sinus drainage and cough. She is also on coumadin for atrial fib. Her abdominal pain is improved. DM is under good control Current Medication: Outpatient Encounter Medications as of 01/23/2018  Medication Sig  . ACCU-CHEK FASTCLIX LANCETS MISC   . Alcohol Swabs (B-D SINGLE USE SWABS REGULAR) PADS   . atorvastatin (LIPITOR) 10 MG tablet Take 1 tablet (10 mg total) by mouth every evening.  . citalopram (CELEXA) 40 MG tablet Take 1 tablet (40 mg total) by mouth daily.  . digoxin (LANOXIN) 0.125 MG tablet Take 125 mcg by mouth daily.  [place] Kitchen donepezil (ARICEPT) 5 MG tablet Take 5 mg by mouth daily.   . furosemide (LASIX) 20 MG tablet Take 20 mg by mouth daily.  [place] Kitchen glucose blood (ACCU-CHEK GUIDE) test strip   . glucose blood test strip Use as instructed to check blood sugars twice a day.  E11.65  . Insulin Glargine (TOUJEO SOLOSTAR) 300 UNIT/ML SOPN Inject 20 mg into the skin daily.  . Insulin Pen Needle (BD PEN NEEDLE NANO U/F) 32G X 4 MM MISC   . liraglutide (VICTOZA) 18 MG/3ML SOPN Inject 0.2 mLs (1.2 mg total) into the skin daily.  . pantoprazole (PROTONIX) 40 MG tablet Take 40 mg by mouth 2 (two) times daily.  [place] Kitchen rOPINIRole (REQUIP) 0.5 MG tablet Take 1 tablet (0.5 mg total) by mouth every evening.  . traMADol (ULTRAM) 50 MG tablet Take 1 tablet (50 mg total) by mouth 2 (two) times daily.  [place] Kitchen warfarin (COUMADIN) 4 MG tablet Take 4 mg by mouth See admin instructions. tk 1.5 tabs on Tuesday and Friday, and 1 tablet on the rest of the days  . azithromycin (ZITHROMAX) 250 MG tablet  Take one tab po qd  . [EXPIRED] ipratropium-albuterol (DUONEB) 0.5-2.5 (3) MG/3ML nebulizer solution 3 mL    No facility-administered encounter medications on file as of 01/23/2018.     Surgical History: Past Surgical History:  Procedure Laterality Date  . ABDOMINAL HYSTERECTOMY    . APPENDECTOMY    . CATARACT EXTRACTION    . CHOLECYSTECTOMY    . HERNIA REPAIR    . right knee replacement    . right nephroectomy      Medical History: Past Medical History:  Diagnosis Date  . Atrial fibrillation ([place])   . Congestive heart failure (CHF) ([place])   . Diabetes mellitus without complication ([place])   . GERD (gastroesophageal reflux disease)   . Hyperlipidemia   . Hypertension     Family History: Family History  Problem Relation Age of Onset  . Breast cancer Mother     Social History   Socioeconomic History  . Marital status: Married    Spouse name: Not on file  . Number of children: Not on file  . Years of education: Not on file  . Highest education level: Not on file  Occupational History  . Not on file  Social Needs  . Financial resource strain: Not on file  . Food insecurity:    Worry: Not on file  Inability: Not on file  . Transportation needs:    Medical: Not on file    Non-medical: Not on file  Tobacco Use  . Smoking status: Current Every Day Smoker    Packs/day: 1.00    Types: Cigarettes  . Smokeless tobacco: Never Used  Substance and Sexual Activity  . Alcohol use: No  . Drug use: No  . Sexual activity: Not on file  Lifestyle  . Physical activity:    Days per week: Not on file    Minutes per session: Not on file  . Stress: Not on file  Relationships  . Social connections:    Talks on phone: Not on file    Gets together: Not on file    Attends religious service: Not on file    Active member of club or organization: Not on file    Attends meetings of clubs or organizations: Not on file    Relationship status: Not on file  . Intimate partner  violence:    Fear of current or ex partner: Not on file    Emotionally abused: Not on file    Physically abused: Not on file    Forced sexual activity: Not on file  Other Topics Concern  . Not on file  Social History Narrative  . Not on file    Review of Systems  Constitutional: Negative for chills, fatigue and unexpected weight change.  HENT: Positive for postnasal drip. Negative for congestion, rhinorrhea, sneezing and sore throat.   Eyes: Negative for redness.  Respiratory: Positive for cough and shortness of breath. Negative for chest tightness.   Cardiovascular: Negative for chest pain and palpitations.  Gastrointestinal: Negative for abdominal pain, constipation, diarrhea, nausea and vomiting.  Genitourinary: Negative for dysuria and frequency.  Musculoskeletal: Negative for arthralgias, back pain, joint swelling and neck pain.  Skin: Negative for rash.  Neurological: Negative.  Negative for tremors and numbness.  Hematological: Negative for adenopathy. Does not bruise/bleed easily.  Psychiatric/Behavioral: Negative for behavioral problems (Depression), sleep disturbance and suicidal ideas. The patient is not nervous/anxious.    Vital Signs: BP (!) 129/48 (BP Location: Right Arm, Patient Position: Sitting, Cuff Size: Large)   Pulse 67   Resp 16   Wt 139 lb 3.2 oz (63.1 kg)   SpO2 96%   BMI 27.19 kg/m   Physical Exam  Constitutional: She is oriented to person, place, and time. She appears well-developed and well-nourished. No distress.  HENT:  Head: Normocephalic and atraumatic.  Mouth/Throat: Oropharynx is clear and moist. No oropharyngeal exudate.  Eyes: Pupils are equal, round, and reactive to light. EOM are normal.  Neck: Normal range of motion. Neck supple. No JVD present. No tracheal deviation present. No thyromegaly present.  Cardiovascular: Normal rate, regular rhythm and normal heart sounds. Exam reveals no gallop and no friction rub.  No murmur  heard. Pulmonary/Chest: Effort normal. No respiratory distress. She has wheezes. She has no rales. She exhibits no tenderness.  Abdominal: Soft. Bowel sounds are normal.  Musculoskeletal: Normal range of motion. She exhibits tenderness.  Lymphadenopathy:    She has no cervical adenopathy.  Neurological: She is alert and oriented to person, place, and time. No cranial nerve deficit.  Skin: Skin is warm and dry. She is not diaphoretic.  Psychiatric: She has a normal mood and affect. Her behavior is normal. Judgment and thought content normal.   Assessment/Plan: 1. Acute bronchitis with COPD ([place]) - ipratropium-albuterol (DUONEB) 0.5-2.5 (3) MG/3ML nebulizer solution 3 mL - Spirometry with  graph - Spirometry with graph  2. Colitis - Improving  3. Atrial fibrillation, unspecified type ([place]) - POCT INR elevated, will hold coumadin for 2 days and then resmle as before   4. Essential hypertension - Controlled with meds  5. Chronic kidney disease, stage III (moderate) ([facility]) - Stable   General Counseling: Virdie verbalizes understanding of the findings of todays visit and agrees with plan of treatment. I have discussed any further diagnostic evaluation that may be needed or ordered today. We also reviewed her medications today. she has been encouraged to call the office with any questions or concerns that should arise related to todays visit.  Orders Placed This Encounter  Procedures  . POCT INR  . Spirometry with graph  . Spirometry with graph    I have reviewed all medical records from hospital follow up including radiology reports and consults from other physicians. Appropriate follow up diagnostics will be scheduled as needed. Patient/ Family understands the plan of treatment. Time spent 28minutes.   Dr Lavera Guise, MD Internal Medicine

## 2018-01-30 ENCOUNTER — Ambulatory Visit: Payer: Self-pay

## 2018-01-30 ENCOUNTER — Ambulatory Visit (INDEPENDENT_AMBULATORY_CARE_PROVIDER_SITE_OTHER): Payer: Medicare HMO | Admitting: Internal Medicine

## 2018-01-30 ENCOUNTER — Encounter: Payer: Self-pay | Admitting: Internal Medicine

## 2018-01-30 VITALS — BP 138/78 | HR 62 | Temp 98.2°F | Resp 16 | Ht 60.0 in | Wt 133.8 lb

## 2018-01-30 DIAGNOSIS — I482 Chronic atrial fibrillation, unspecified: Secondary | ICD-10-CM

## 2018-01-30 DIAGNOSIS — N184 Chronic kidney disease, stage 4 (severe): Secondary | ICD-10-CM | POA: Diagnosis not present

## 2018-01-30 DIAGNOSIS — E1122 Type 2 diabetes mellitus with diabetic chronic kidney disease: Secondary | ICD-10-CM | POA: Diagnosis not present

## 2018-01-30 DIAGNOSIS — R112 Nausea with vomiting, unspecified: Secondary | ICD-10-CM | POA: Diagnosis not present

## 2018-01-30 DIAGNOSIS — Z7901 Long term (current) use of anticoagulants: Secondary | ICD-10-CM

## 2018-01-30 LAB — POCT INR

## 2018-01-30 MED ORDER — METOCLOPRAMIDE HCL 5 MG PO TABS: ORAL_TABLET | ORAL | 3 refills | Status: DC

## 2018-01-30 NOTE — Progress Notes (Signed)
[facility] [place], [place] [address]  Internal MEDICINE  Office Visit Note  Patient Name: [name]  [id number]  [id number]  Date of Service: 02/28/2018  Chief Complaint  Patient presents with  . Cough    has been going on for nearly three weeks. feeling weak  . Vomiting  . Coagulation Disorder    HPI  Pt is here for routine follow up. Pt was just seen last week with acute bronchitis, she is feeling much better but still feels wek, nausea is present. Pt is also on Victoza, DM is under good control   Current Medication: Outpatient Encounter Medications as of 01/30/2018  Medication Sig  . ACCU-CHEK FASTCLIX LANCETS MISC   . Alcohol Swabs (B-D SINGLE USE SWABS REGULAR) PADS   . atorvastatin (LIPITOR) 10 MG tablet Take 1 tablet (10 mg total) by mouth every evening.  Marland Kitchen azithromycin (ZITHROMAX) 250 MG tablet Take one tab po qd  . citalopram (CELEXA) 40 MG tablet Take 1 tablet (40 mg total) by mouth daily.  . digoxin (LANOXIN) 0.125 MG tablet Take 125 mcg by mouth daily.  . furosemide (LASIX) 20 MG tablet Take 20 mg by mouth daily.  Marland Kitchen glucose blood (ACCU-CHEK GUIDE) test strip   . glucose blood test strip Use as instructed to check blood sugars twice a day.  E11.65  . Insulin Glargine (TOUJEO SOLOSTAR) 300 UNIT/ML SOPN Inject 20 mg into the skin daily.  . Insulin Pen Needle (BD PEN NEEDLE NANO U/F) 32G X 4 MM MISC   . liraglutide (VICTOZA) 18 MG/3ML SOPN Inject 0.2 mLs (1.2 mg total) into the skin daily.  . pantoprazole (PROTONIX) 40 MG tablet Take 40 mg by mouth 2 (two) times daily.  Marland Kitchen rOPINIRole (REQUIP) 0.5 MG tablet Take 1 tablet (0.5 mg total) by mouth every evening.  . traMADol (ULTRAM) 50 MG tablet Take 1 tablet (50 mg total) by mouth 2 (two) times daily.  Marland Kitchen warfarin (COUMADIN) 4 MG tablet Take 4 mg by mouth See admin instructions. tk 1.5 tabs on Tuesday and Friday, and 1 tablet on the rest of the days  . [DISCONTINUED] donepezil (ARICEPT) 5 MG  tablet Take 5 mg by mouth daily.   . metoCLOPramide (REGLAN) 5 MG tablet Take one tab po bid for nausea and one at bed time prn   No facility-administered encounter medications on file as of 01/30/2018.     Surgical History: Past Surgical History:  Procedure Laterality Date  . ABDOMINAL HYSTERECTOMY    . APPENDECTOMY    . CATARACT EXTRACTION    . CHOLECYSTECTOMY    . HERNIA REPAIR    . right knee replacement    . right nephroectomy      Medical History: Past Medical History:  Diagnosis Date  . Atrial fibrillation ([place])   . Congestive heart failure (CHF) ([place])   . Diabetes mellitus without complication ([place])   . GERD (gastroesophageal reflux disease)   . Hyperlipidemia   . Hypertension     Family History: Family History  Problem Relation Age of Onset  . Breast cancer Mother     Social History   Socioeconomic History  . Marital status: Married    Spouse name: Not on file  . Number of children: Not on file  . Years of education: Not on file  . Highest education level: Not on file  Occupational History  . Not on file  Social Needs  . Financial resource strain: Not on file  .  Food insecurity:    Worry: Not on file    Inability: Not on file  . Transportation needs:    Medical: Not on file    Non-medical: Not on file  Tobacco Use  . Smoking status: Current Every Day Smoker    Packs/day: 1.00    Types: Cigarettes  . Smokeless tobacco: Never Used  Substance and Sexual Activity  . Alcohol use: No  . Drug use: No  . Sexual activity: Not on file  Lifestyle  . Physical activity:    Days per week: Not on file    Minutes per session: Not on file  . Stress: Not on file  Relationships  . Social connections:    Talks on phone: Not on file    Gets together: Not on file    Attends religious service: Not on file    Active member of club or organization: Not on file    Attends meetings of clubs or organizations: Not on file    Relationship status: Not on file  .  Intimate partner violence:    Fear of current or ex partner: Not on file    Emotionally abused: Not on file    Physically abused: Not on file    Forced sexual activity: Not on file  Other Topics Concern  . Not on file  Social History Narrative  . Not on file   Review of Systems  Constitutional: Positive for fatigue. Negative for chills and diaphoresis.  HENT: Negative for ear pain, postnasal drip and sinus pressure.   Eyes: Negative for photophobia, discharge, redness, itching and visual disturbance.  Respiratory: Negative for cough, shortness of breath and wheezing.   Cardiovascular: Negative for chest pain, palpitations and leg swelling.  Gastrointestinal: Positive for abdominal pain and nausea. Negative for constipation, diarrhea and vomiting.  Genitourinary: Negative for dysuria and flank pain.  Musculoskeletal: Negative for arthralgias, back pain, gait problem and neck pain.  Skin: Negative for color change.  Allergic/Immunologic: Negative for environmental allergies and food allergies.  Neurological: Negative for dizziness and headaches.  Hematological: Does not bruise/bleed easily.  Psychiatric/Behavioral: Negative for agitation, behavioral problems (depression) and hallucinations.   Vital Signs: BP 138/78 (BP Location: Right Arm, Patient Position: Sitting, Cuff Size: Normal)   Pulse 62   Temp 98.2 F (36.8 C)   Resp 16   Ht 5' (1.524 m)   Wt 133 lb 12.8 oz (60.7 kg)   SpO2 97%   BMI 26.13 kg/m   Physical Exam  Constitutional: She is oriented to person, place, and time. She appears well-developed and well-nourished. No distress.  HENT:  Head: Normocephalic and atraumatic.  Mouth/Throat: Oropharynx is clear and moist. No oropharyngeal exudate.  Eyes: Pupils are equal, round, and reactive to light. EOM are normal.  Neck: Normal range of motion. Neck supple. No JVD present. No tracheal deviation present. No thyromegaly present.  Cardiovascular: Normal rate, regular  rhythm and normal heart sounds. Exam reveals no gallop and no friction rub.  No murmur heard. Pulmonary/Chest: Effort normal. No respiratory distress. She has no wheezes. She has no rales. She exhibits no tenderness.  Abdominal: Soft. Bowel sounds are normal.  Musculoskeletal: Normal range of motion.  Lymphadenopathy:    She has no cervical adenopathy.  Neurological: She is alert and oriented to person, place, and time. No cranial nerve deficit.  Skin: Skin is warm and dry. She is not diaphoretic.  Psychiatric: She has a normal mood and affect. Her behavior is normal. Judgment and thought  content normal.   Assessment/Plan: 1. Chronic atrial fibrillation (HCC) - POCT INR  2. Chronic anticoagulation - Continue Coumadin as before   3. Non-intractable vomiting with nausea, unspecified vomiting type - Add metoclopramide prn, educated about Vicotza side effects   4. Type 2 diabetes mellitus with stage 4 chronic kidney disease, without long-term current use of insulin ([place]) - Controlled  General Counseling: Marilla verbalizes understanding of the findings of todays visit and agrees with plan of treatment. I have discussed any further diagnostic evaluation that may be needed or ordered today. We also reviewed her medications today. she has been encouraged to call the office with any questions or concerns that should arise related to todays visit.   Orders Placed This Encounter  Procedures  . POCT INR    Meds ordered this encounter  Medications  . metoCLOPramide (REGLAN) 5 MG tablet    Sig: Take one tab po bid for nausea and one at bed time prn    Dispense:  45 tablet    Refill:  3    Time spent: 25 Minutes   Dr Lavera Guise Internal medicine

## 2018-02-06 ENCOUNTER — Other Ambulatory Visit: Payer: Self-pay | Admitting: Internal Medicine

## 2018-03-13 ENCOUNTER — Telehealth: Payer: Self-pay

## 2018-03-13 ENCOUNTER — Ambulatory Visit (INDEPENDENT_AMBULATORY_CARE_PROVIDER_SITE_OTHER): Payer: Medicare HMO | Admitting: Internal Medicine

## 2018-03-13 ENCOUNTER — Encounter: Payer: Self-pay | Admitting: Internal Medicine

## 2018-03-13 VITALS — BP 140/47 | HR 72 | Resp 16 | Ht 59.0 in | Wt 133.0 lb

## 2018-03-13 DIAGNOSIS — Z0001 Encounter for general adult medical examination with abnormal findings: Secondary | ICD-10-CM

## 2018-03-13 DIAGNOSIS — I482 Chronic atrial fibrillation, unspecified: Secondary | ICD-10-CM

## 2018-03-13 DIAGNOSIS — Z1239 Encounter for other screening for malignant neoplasm of breast: Secondary | ICD-10-CM

## 2018-03-13 DIAGNOSIS — H9191 Unspecified hearing loss, right ear: Secondary | ICD-10-CM | POA: Diagnosis not present

## 2018-03-13 DIAGNOSIS — E1165 Type 2 diabetes mellitus with hyperglycemia: Secondary | ICD-10-CM

## 2018-03-13 DIAGNOSIS — Z1231 Encounter for screening mammogram for malignant neoplasm of breast: Secondary | ICD-10-CM | POA: Diagnosis not present

## 2018-03-13 DIAGNOSIS — I480 Paroxysmal atrial fibrillation: Secondary | ICD-10-CM | POA: Diagnosis not present

## 2018-03-13 DIAGNOSIS — R300 Dysuria: Secondary | ICD-10-CM

## 2018-03-13 LAB — POCT GLYCOSYLATED HEMOGLOBIN (HGB A1C): HbA1c, POC (controlled diabetic range): 8.8 % — AB (ref 0.0–7.0)

## 2018-03-13 LAB — POCT INR

## 2018-03-13 MED ORDER — PANTOPRAZOLE SODIUM 40 MG PO TBEC: 40.0000 mg | DELAYED_RELEASE_TABLET | Freq: Two times a day (BID) | ORAL | 12 refills | Status: DC

## 2018-03-13 MED ORDER — WARFARIN SODIUM 4 MG PO TABS: 4.0000 mg | ORAL_TABLET | ORAL | 3 refills | Status: DC

## 2018-03-13 NOTE — Telephone Encounter (Signed)
Gave kelly from [facility] order for PT INR once a month as per dfk

## 2018-03-13 NOTE — Progress Notes (Signed)
[facility] [place], [place] [address]  Internal MEDICINE  Office Visit Note  Patient Name: [name]  [id number]  [id number]  Date of Service: 03/13/2018  Chief Complaint  Patient presents with  . Annual Exam    HPI Pt is here for routine health maintenance examination. C/O decreased hearing, c/o neck pain, She has poor posture. She does to adult day care and the diet has not been good. Her sugar in the evening has been over 200. Diabetic eye exam is abnormal.     Current Medication: Outpatient Encounter Medications as of 03/13/2018  Medication Sig  . atorvastatin (LIPITOR) 10 MG tablet Take 1 tablet (10 mg total) by mouth every evening.  . citalopram (CELEXA) 40 MG tablet Take 1 tablet (40 mg total) by mouth daily.  . digoxin (LANOXIN) 0.125 MG tablet Take 125 mcg by mouth daily.  Marland Kitchen donepezil (ARICEPT) 5 MG tablet TAKE 1 TABLET BY MOUTH ONCE DAILY FOR MEMORY  . furosemide (LASIX) 20 MG tablet Take 20 mg by mouth daily.  Marland Kitchen glimepiride (AMARYL) 2 MG tablet   . Insulin Glargine (TOUJEO SOLOSTAR) 300 UNIT/ML SOPN Inject 20 mg into the skin daily.  Marland Kitchen liraglutide (VICTOZA) 18 MG/3ML SOPN Inject 0.2 mLs (1.2 mg total) into the skin daily.  . pantoprazole (PROTONIX) 40 MG tablet Take 1 tablet (40 mg total) by mouth 2 (two) times daily.  Marland Kitchen rOPINIRole (REQUIP) 0.5 MG tablet Take 1 tablet (0.5 mg total) by mouth every evening.  . traMADol (ULTRAM) 50 MG tablet Take 1 tablet (50 mg total) by mouth 2 (two) times daily.  Marland Kitchen warfarin (COUMADIN) 4 MG tablet Take 1 tablet (4 mg total) by mouth See admin instructions. tk 1.5 tabs on Tuesday and Friday, and 1 tablet on the rest of the days  . [DISCONTINUED] pantoprazole (PROTONIX) 40 MG tablet Take 40 mg by mouth 2 (two) times daily.  . [DISCONTINUED] warfarin (COUMADIN) 4 MG tablet Take 4 mg by mouth See admin instructions. tk 1.5 tabs on Tuesday and Friday, and 1 tablet on the rest of the days  . ACCU-CHEK FASTCLIX  LANCETS MISC   . Alcohol Swabs (B-D SINGLE USE SWABS REGULAR) PADS   . azithromycin (ZITHROMAX) 250 MG tablet Take one tab po qd (Patient not taking: Reported on 03/13/2018)  . glucose blood (ACCU-CHEK GUIDE) test strip   . glucose blood test strip Use as instructed to check blood sugars twice a day.  E11.65  . Insulin Pen Needle (BD PEN NEEDLE NANO U/F) 32G X 4 MM MISC   . metoCLOPramide (REGLAN) 5 MG tablet Take one tab po bid for nausea and one at bed time prn   No facility-administered encounter medications on file as of 03/13/2018.     Surgical History: Past Surgical History:  Procedure Laterality Date  . ABDOMINAL HYSTERECTOMY    . APPENDECTOMY    . CATARACT EXTRACTION    . CHOLECYSTECTOMY    . HERNIA REPAIR    . right knee replacement    . right nephroectomy      Medical History: Past Medical History:  Diagnosis Date  . Atrial fibrillation ([place])   . Congestive heart failure (CHF) ([place])   . Diabetes mellitus without complication ([place])   . GERD (gastroesophageal reflux disease)   . Hyperlipidemia   . Hypertension     Family History: Family History  Problem Relation Age of Onset  . Breast cancer Mother     Review of Systems  Constitutional: Negative for chills, diaphoresis and fatigue.  HENT: Negative for ear pain, postnasal drip and sinus pressure.   Eyes: Negative for photophobia, discharge, redness, itching and visual disturbance.  Respiratory: Negative for cough, shortness of breath and wheezing.   Cardiovascular: Negative for chest pain, palpitations and leg swelling.  Gastrointestinal: Negative for abdominal pain, constipation, diarrhea, nausea and vomiting.  Genitourinary: Negative for dysuria and flank pain.  Musculoskeletal: Positive for neck pain. Negative for arthralgias, back pain and gait problem.  Skin: Negative for color change.  Allergic/Immunologic: Negative for environmental allergies and food allergies.  Neurological: Negative for dizziness and  headaches.  Hematological: Does not bruise/bleed easily.  Psychiatric/Behavioral: Negative for agitation, behavioral problems (depression) and hallucinations.   Vital Signs: BP (!) 140/47   Pulse 72   Resp 16   Ht _0  (1.499 m)   Wt 133 lb (60.3 kg)   SpO2 97%   BMI 26.86 kg/m    Physical Exam  Constitutional: She is oriented to person, place, and time. She appears well-developed and well-nourished. No distress.  HENT:  Head: Normocephalic and atraumatic.  Mouth/Throat: Oropharynx is clear and moist. No oropharyngeal exudate.  Eyes: Pupils are equal, round, and reactive to light. EOM are normal.  Neck: Normal range of motion. Neck supple. No JVD present. No tracheal deviation present. No thyromegaly present.  Cardiovascular: Normal rate, regular rhythm and normal heart sounds. Exam reveals no gallop and no friction rub.  No murmur heard. Pulses:      Dorsalis pedis pulses are 2+ on the right side, and 2+ on the left side.       Posterior tibial pulses are 2+ on the right side, and 2+ on the left side.  Pulmonary/Chest: Effort normal. No respiratory distress. She has no wheezes. She has no rales. She exhibits no tenderness.  Abdominal: Soft. Bowel sounds are normal.  Musculoskeletal: Normal range of motion.       Right foot: There is no deformity.       Left foot: There is no deformity.  Feet:  Right Foot:  Protective Sensation: 3 sites tested.  Skin Integrity: Positive for ulcer and blister.  Left Foot:  Protective Sensation: 3 sites tested.  Skin Integrity: Negative for ulcer or blister.  Lymphadenopathy:    She has no cervical adenopathy.  Neurological: She is alert and oriented to person, place, and time. No cranial nerve deficit or sensory deficit.  Diabetic foot exam .. Normal sensory // pain   Skin: Skin is warm and dry. She is not diaphoretic.  Psychiatric: She has a normal mood and affect. Her behavior is normal. Judgment and thought content normal.    LABS: Recent Results (from the past 2160 hour(s))  POCT glycosylated hemoglobin (Hb A1C)     Status: None   Collection Time: 12/27/17  9:45 AM  Result Value Ref Range   Hemoglobin A1C 6.9   POCT INR     Status: None   Collection Time: 12/27/17  9:45 AM  Result Value Ref Range   INR 2.8inr and 33.3   Lipase, blood     Status: Abnormal   Collection Time: 01/11/18  3:21 PM  Result Value Ref Range   Lipase 58 (H) 11 - 51 U/L    Comment: Performed at [facility], [address], [place], [place] [address]  Comprehensive metabolic panel     Status: Abnormal   Collection Time: 01/11/18  3:21 PM  Result Value Ref Range   Sodium 136  135 - 145 mmol/L   Potassium 4.8 3.5 - 5.1 mmol/L    Comment: HEMOLYSIS AT THIS LEVEL MAY AFFECT RESULT   Chloride 97 (L) 101 - 111 mmol/L   CO2 25 22 - 32 mmol/L   Glucose, Bld 271 (H) 65 - 99 mg/dL   BUN 22 (H) 6 - 20 mg/dL   Creatinine, Ser 1.52 (H) 0.44 - 1.00 mg/dL   Calcium 9.4 8.9 - 10.3 mg/dL   Total Protein 8.1 6.5 - 8.1 g/dL   Albumin 3.8 3.5 - 5.0 g/dL   AST 37 15 - 41 U/L    Comment: HEMOLYSIS AT THIS LEVEL MAY AFFECT RESULT   ALT 12 (L) 14 - 54 U/L    Comment: HEMOLYSIS AT THIS LEVEL MAY AFFECT RESULT   Alkaline Phosphatase 91 38 - 126 U/L   Total Bilirubin 1.3 (H) 0.3 - 1.2 mg/dL    Comment: HEMOLYSIS AT THIS LEVEL MAY AFFECT RESULT   GFR calc non Af Amer 32 (L) >60 mL/min   GFR calc Af Amer 37 (L) >60 mL/min    Comment: (NOTE) The eGFR has been calculated using the CKD EPI equation. This calculation has not been validated in all clinical situations. eGFR's persistently <60 mL/min signify possible Chronic Kidney Disease.    Anion gap 14 5 - 15    Comment: Performed at [facility] [address] [place], [place] [address]  CBC     Status: Abnormal   Collection Time: 01/11/18  3:21 PM  Result Value Ref Range   WBC 25.2 (H) 3.6 - 11.0 K/uL   RBC 4.98 3.80 - 5.20 MIL/uL   Hemoglobin 15.9 12.0 - 16.0 g/dL    HCT 46.8 35.0 - 47.0 %   MCV 93.9 80.0 - 100.0 fL   MCH 31.9 26.0 - 34.0 pg   MCHC 34.0 32.0 - 36.0 g/dL   RDW 13.8 11.5 - 14.5 %   Platelets 246 150 - 440 K/uL    Comment: Performed at [facility], [address] [place], [place] [address]  Glucose, capillary     Status: Abnormal   Collection Time: 01/11/18  3:29 PM  Result Value Ref Range   Glucose-Capillary 264 (H) 65 - 99 mg/dL  Urinalysis, Complete w Microscopic     Status: Abnormal   Collection Time: 01/11/18  7:22 PM  Result Value Ref Range   Color, Urine YELLOW (A) YELLOW   APPearance HAZY (A) CLEAR   Specific Gravity, Urine 1.016 1.005 - 1.030   pH 5.0 5.0 - 8.0   Glucose, UA NEGATIVE NEGATIVE mg/dL   Hgb urine dipstick LARGE (A) NEGATIVE   Bilirubin Urine NEGATIVE NEGATIVE   Ketones, ur NEGATIVE NEGATIVE mg/dL   Protein, ur 100 (A) NEGATIVE mg/dL   Nitrite NEGATIVE NEGATIVE   Leukocytes, UA NEGATIVE NEGATIVE   RBC / HPF TOO NUMEROUS TO COUNT 0 - 5 RBC/hpf   WBC, UA 0-5 0 - 5 WBC/hpf   Bacteria, UA FEW (A) NONE SEEN   Squamous Epithelial / LPF 0-5 (A) NONE SEEN   Mucus PRESENT    Hyaline Casts, UA PRESENT     Comment: Performed at [facility], [address] [place], [place] [address]  Urine culture     Status: Abnormal   Collection Time: 01/11/18  7:22 PM  Result Value Ref Range   Specimen Description      URINE, RANDOM Performed at [facility], [address] [place], [place] [address]  Special Requests      NONE Performed at [facility], [address] [place], [place] [address]    Culture MULTIPLE SPECIES PRESENT, SUGGEST RECOLLECTION (A)    Report Status 01/13/2018 FINAL   Blood Culture (routine x 2)     Status: None   Collection Time: 01/11/18  8:47 PM  Result Value Ref Range   Specimen Description BLOOD RIGHT ANTECUBITAL    Special Requests      BOTTLES DRAWN AEROBIC AND ANAEROBIC Blood Culture adequate volume   Culture      NO GROWTH 5  DAYS Performed at [facility], [address] [place], [place] [address]    Report Status 01/16/2018 FINAL   Blood Culture (routine x 2)     Status: None   Collection Time: 01/11/18  8:47 PM  Result Value Ref Range   Specimen Description BLOOD RIGHT HAND    Special Requests      BOTTLES DRAWN AEROBIC AND ANAEROBIC Blood Culture adequate volume   Culture      NO GROWTH 5 DAYS Performed at [facility], [address], [place], [place] [address]    Report Status 01/16/2018 FINAL   Lactic acid, plasma     Status: Abnormal   Collection Time: 01/11/18  8:47 PM  Result Value Ref Range   Lactic Acid, Venous 2.0 (HH) 0.5 - 1.9 mmol/L    Comment: CRITICAL RESULT CALLED TO, READ BACK BY AND VERIFIED WITH DAVID WALKER AT 2145 ON 01/11/2018 JJB Performed at [facility], [address] [place], [place] [address]   Lactic acid, plasma     Status: None   Collection Time: 01/12/18  2:45 AM  Result Value Ref Range   Lactic Acid, Venous 1.9 0.5 - 1.9 mmol/L    Comment: Performed at [facility], [address] [place], [place] [address]  Basic metabolic panel     Status: Abnormal   Collection Time: 01/12/18  2:45 AM  Result Value Ref Range   Sodium 138 135 - 145 mmol/L   Potassium 4.8 3.5 - 5.1 mmol/L   Chloride 100 (L) 101 - 111 mmol/L   CO2 29 22 - 32 mmol/L   Glucose, Bld 167 (H) 65 - 99 mg/dL   BUN 30 (H) 6 - 20 mg/dL   Creatinine, Ser 1.83 (H) 0.44 - 1.00 mg/dL   Calcium 8.8 (L) 8.9 - 10.3 mg/dL   GFR calc non Af Amer 26 (L) >60 mL/min   GFR calc Af Amer 30 (L) >60 mL/min    Comment: (NOTE) The eGFR has been calculated using the CKD EPI equation. This calculation has not been validated in all clinical situations. eGFR's persistently <60 mL/min signify possible Chronic Kidney Disease.    Anion gap 9 5 - 15    Comment: Performed at [facility], [address] [place], [place] [address]  CBC     Status: Abnormal   Collection  Time: 01/12/18  2:45 AM  Result Value Ref Range   WBC 13.9 (H) 3.6 - 11.0 K/uL   RBC 4.19 3.80 - 5.20 MIL/uL   Hemoglobin 13.4 12.0 - 16.0 g/dL   HCT 39.7 35.0 - 47.0 %   MCV 94.7 80.0 - 100.0 fL   MCH 32.1 26.0 - 34.0 pg   MCHC 33.9 32.0 - 36.0 g/dL   RDW 13.5 11.5 - 14.5 %   Platelets 214 150 - 440 K/uL    Comment: Performed at [facility],  [place], [place] [address]  Lactic acid, plasma     Status: None   Collection Time: 01/12/18  6:12 AM  Result Value Ref Range   Lactic Acid, Venous 1.1 0.5 - 1.9 mmol/L    Comment: Performed at [facility], [address] [place], [place] [address]  Protime-INR     Status: Abnormal   Collection Time: 01/12/18  6:12 AM  Result Value Ref Range   Prothrombin Time 21.5 (H) 11.4 - 15.2 seconds   INR 1.89     Comment: Performed at [facility], [address] [place], [place] [address]  Glucose, capillary     Status: Abnormal   Collection Time: 01/12/18  7:43 AM  Result Value Ref Range   Glucose-Capillary 100 (H) 65 - 99 mg/dL  C difficile quick scan w PCR reflex     Status: None   Collection Time: 01/12/18 11:34 AM  Result Value Ref Range   C Diff antigen NEGATIVE NEGATIVE   C Diff toxin NEGATIVE NEGATIVE   C Diff interpretation No C. difficile detected.     Comment: Performed at [facility], [address] [place], [place] [address]  Glucose, capillary     Status: Abnormal   Collection Time: 01/12/18 11:43 AM  Result Value Ref Range   Glucose-Capillary 195 (H) 65 - 99 mg/dL  Glucose, capillary     Status: Abnormal   Collection Time: 01/12/18  4:46 PM  Result Value Ref Range   Glucose-Capillary 149 (H) 65 - 99 mg/dL  Glucose, capillary     Status: Abnormal   Collection Time: 01/12/18  9:14 PM  Result Value Ref Range   Glucose-Capillary 209 (H) 65 - 99 mg/dL  Protime-INR     Status: Abnormal   Collection Time: 01/13/18  6:02 AM  Result Value Ref Range   Prothrombin Time 19.2  (H) 11.4 - 15.2 seconds   INR 1.63     Comment: Performed at [facility], [address] [place], [place] [address]  Glucose, capillary     Status: None   Collection Time: 01/13/18  7:58 AM  Result Value Ref Range   Glucose-Capillary 99 65 - 99 mg/dL  Glucose, capillary     Status: Abnormal   Collection Time: 01/13/18 12:06 PM  Result Value Ref Range   Glucose-Capillary 169 (H) 65 - 99 mg/dL  Glucose, capillary     Status: Abnormal   Collection Time: 01/13/18  4:33 PM  Result Value Ref Range   Glucose-Capillary 167 (H) 65 - 99 mg/dL  Glucose, capillary     Status: Abnormal   Collection Time: 01/13/18 10:28 PM  Result Value Ref Range   Glucose-Capillary 197 (H) 65 - 99 mg/dL  Protime-INR     Status: Abnormal   Collection Time: 01/14/18  4:52 AM  Result Value Ref Range   Prothrombin Time 19.8 (H) 11.4 - 15.2 seconds   INR 1.70     Comment: Performed at [facility], [address], [place],  [address]  Basic metabolic panel     Status: Abnormal   Collection Time: 01/14/18  4:52 AM  Result Value Ref Range   Sodium 139 135 - 145 mmol/L   Potassium 4.1 3.5 - 5.1 mmol/L   Chloride 108 101 - 111 mmol/L   CO2 24 22 - 32 mmol/L   Glucose, Bld 135 (H) 65 - 99 mg/dL   BUN 26 (H) 6 - 20 mg/dL   Creatinine,  Ser 1.75 (H) 0.44 - 1.00 mg/dL   Calcium 8.1 (L) 8.9 - 10.3 mg/dL   GFR calc non Af Amer 27 (L) >60 mL/min   GFR calc Af Amer 31 (L) >60 mL/min    Comment: (NOTE) The eGFR has been calculated using the CKD EPI equation. This calculation has not been validated in all clinical situations. eGFR's persistently <60 mL/min signify possible Chronic Kidney Disease.    Anion gap 7 5 - 15    Comment: Performed at [facility], [address] [place], [place] [address]  Glucose, capillary     Status: Abnormal   Collection Time: 01/14/18  7:35 AM  Result Value Ref Range   Glucose-Capillary 119 (H) 65 - 99 mg/dL  Glucose, capillary     Status:  Abnormal   Collection Time: 01/14/18 11:29 AM  Result Value Ref Range   Glucose-Capillary 239 (H) 65 - 99 mg/dL  Glucose, capillary     Status: Abnormal   Collection Time: 01/14/18  4:20 PM  Result Value Ref Range   Glucose-Capillary 122 (H) 65 - 99 mg/dL  Glucose, capillary     Status: Abnormal   Collection Time: 01/14/18 10:45 PM  Result Value Ref Range   Glucose-Capillary 250 (H) 65 - 99 mg/dL  Protime-INR     Status: Abnormal   Collection Time: 01/15/18  6:00 AM  Result Value Ref Range   Prothrombin Time 19.9 (H) 11.4 - 15.2 seconds   INR 1.71     Comment: Performed at [facility], [address] [place], [place] [address]  Glucose, capillary     Status: Abnormal   Collection Time: 01/15/18  7:26 AM  Result Value Ref Range   Glucose-Capillary 155 (H) 65 - 99 mg/dL  Glucose, capillary     Status: Abnormal   Collection Time: 01/15/18 11:30 AM  Result Value Ref Range   Glucose-Capillary 218 (H) 65 - 99 mg/dL  POCT INR     Status: None   Collection Time: 01/23/18  9:52 AM  Result Value Ref Range   INR 4.0inr pt47.7   POCT INR     Status: None   Collection Time: 01/30/18 11:10 AM  Result Value Ref Range   INR 5.4inr 64.6pt   POCT HgB A1C     Status: Abnormal   Collection Time: 03/13/18  9:46 AM  Result Value Ref Range   Hemoglobin A1C  4.0 - 5.6 %   HbA1c, POC (prediabetic range)  5.7 - 6.4 %   HbA1c, POC (controlled diabetic range) 8.8 (A) 0.0 - 7.0 %  POCT INR     Status: None   Collection Time: 03/13/18  9:46 AM  Result Value Ref Range   INR  2.0 - 3.0   Assessment/Plan: 1. Encounter for general adult medical examination with abnormal findings - Updated labs/ pneumovaccine  2. Uncontrolled type 2 diabetes mellitus with hyperglycemia (HCC) - POCT HgB A1C (8.8) higher than previous visit. Increase Toujeo to 18 units q pm, change amaryl 2 mg at lunch and 1 mg with supper. Continue Victoza as before  3. Chronic atrial fibrillation (HCC) - POCT INR, 1.6.  Will change direction and new rx is given, along with home meter   4. Dysuria - Urinalysis, Routine w reflex microscopic  5. Breast screening - MM DIGITAL SCREENING BILATERAL; Future  6. Decreased hearing of right ear - Ambulatory referral to ENT  General Counseling: Amarys verbalizes understanding of the findings of todays visit and agrees with  plan of treatment. I have discussed any further diagnostic evaluation that may be needed or ordered today. We also reviewed her medications today. she has been encouraged to call the office with any questions or concerns that should arise related to todays visit.    Orders Placed This Encounter  Procedures  . MM DIGITAL SCREENING BILATERAL  . Pneumococcal conjugate vaccine 13-valent IM  . Urinalysis, Routine w reflex microscopic  . Ambulatory referral to ENT  . POCT HgB A1C  . POCT INR    Meds ordered this encounter  Medications  . warfarin (COUMADIN) 4 MG tablet    Sig: Take 1 tablet (4 mg total) by mouth See admin instructions. tk 1.5 tabs on Tuesday and Friday, and 1 tablet on the rest of the days    Dispense:  150 tablet    Refill:  3  . pantoprazole (PROTONIX) 40 MG tablet    Sig: Take 1 tablet (40 mg total) by mouth 2 (two) times daily.    Dispense:  60 tablet    Refill:  12    Time spent: [place], MD  Internal Medicine

## 2018-03-14 LAB — URINALYSIS, ROUTINE W REFLEX MICROSCOPIC
Bilirubin, UA: NEGATIVE
GLUCOSE, UA: NEGATIVE
KETONES UA: NEGATIVE
Leukocytes, UA: NEGATIVE
NITRITE UA: NEGATIVE
SPEC GRAV UA: 1.008 (ref 1.005–1.030)
Urobilinogen, Ur: 0.2 mg/dL (ref 0.2–1.0)
pH, UA: 5 (ref 5.0–7.5)

## 2018-03-14 LAB — MICROSCOPIC EXAMINATION
CASTS: NONE SEEN /LPF
WBC UA: NONE SEEN /HPF (ref 0–5)

## 2018-03-21 ENCOUNTER — Other Ambulatory Visit: Payer: Self-pay | Admitting: Internal Medicine

## 2018-03-21 DIAGNOSIS — E11649 Type 2 diabetes mellitus with hypoglycemia without coma: Secondary | ICD-10-CM

## 2018-03-21 MED ORDER — LIRAGLUTIDE 18 MG/3ML ~~LOC~~ SOPN: 1.2000 mg | PEN_INJECTOR | Freq: Every day | SUBCUTANEOUS | 3 refills | Status: DC

## 2018-04-08 DIAGNOSIS — Z7901 Long term (current) use of anticoagulants: Secondary | ICD-10-CM | POA: Diagnosis not present

## 2018-04-08 DIAGNOSIS — I482 Chronic atrial fibrillation: Secondary | ICD-10-CM | POA: Diagnosis not present

## 2018-04-12 DIAGNOSIS — H903 Sensorineural hearing loss, bilateral: Secondary | ICD-10-CM | POA: Diagnosis not present

## 2018-04-16 ENCOUNTER — Encounter: Payer: Self-pay | Admitting: Nurse Practitioner

## 2018-04-16 LAB — PROTIME-INR

## 2018-04-16 NOTE — Progress Notes (Signed)
Scanned in INR done on 04/08/18

## 2018-04-17 ENCOUNTER — Other Ambulatory Visit: Payer: Self-pay | Admitting: Nurse Practitioner

## 2018-04-17 MED ORDER — TRAMADOL HCL 50 MG PO TABS: 50.0000 mg | ORAL_TABLET | Freq: Two times a day (BID) | ORAL | 0 refills | Status: DC

## 2018-05-08 ENCOUNTER — Telehealth: Payer: Self-pay

## 2018-05-08 NOTE — Telephone Encounter (Signed)
CALLED PT AND [name] TO TAKE 1.5 DOSES FOR NEXT DOSING THEN CONTINUE WITH NORMAL DOSING FOR HER MEDICATION DUE TO AN INR RESULTS OF 1.5.

## 2018-05-09 ENCOUNTER — Telehealth: Payer: Self-pay

## 2018-05-09 NOTE — Telephone Encounter (Signed)
CALLED AGAIN TO MAKE SURE PT GOT MY MESSAGE. PT DAUGHTER PICKED UP AND I LET HER KNOW ABOUT CHANGING THE DOSAGE ONE TIME TO 1.5 DOSE AND THEN CONTINUE REGULAR DOSING.

## 2018-05-13 ENCOUNTER — Encounter: Payer: Self-pay | Admitting: Nurse Practitioner

## 2018-05-13 LAB — PROTIME-INR

## 2018-05-13 NOTE — Progress Notes (Signed)
SCANNED IN INR RESULTS FROM 05/08/18.

## 2018-05-24 ENCOUNTER — Telehealth: Payer: Self-pay

## 2018-05-24 DIAGNOSIS — I482 Chronic atrial fibrillation: Secondary | ICD-10-CM | POA: Diagnosis not present

## 2018-05-24 DIAGNOSIS — Z7901 Long term (current) use of anticoagulants: Secondary | ICD-10-CM | POA: Diagnosis not present

## 2018-05-24 NOTE — Telephone Encounter (Signed)
Mdinr  cortney called pt INR is low 1.4 as per heather advised pt daughter advised take  Coumadin 4 mg (1.5 tabs tonight and tomorrow and Tuesday and rest of the day take 1 tab and repeat in week

## 2018-06-03 ENCOUNTER — Telehealth: Payer: Self-pay

## 2018-06-03 NOTE — Telephone Encounter (Signed)
AS PER HEATHER, THE PT INR RESULT WAS 3.5. PT DAUGHTER WAS ADVISED TO HAVE PT TAKE 1/2 DOSE FOR THE NEXT DOSE AND THE CONTINUE WITH REGULAR DOSING.

## 2018-06-11 ENCOUNTER — Telehealth: Payer: Self-pay

## 2018-06-11 NOTE — Telephone Encounter (Signed)
Please schedule her for a follow up with Adam in 2 weeks, she needs to bring all meds with her. She can take one extra coumadin tonite and one on next Monday.Marland Kitchen

## 2018-06-11 NOTE — Telephone Encounter (Signed)
Try to call pt several times and lmom to daughter

## 2018-06-11 NOTE — Telephone Encounter (Signed)
lmom to call us asap

## 2018-06-12 ENCOUNTER — Telehealth: Payer: Self-pay

## 2018-06-12 NOTE — Telephone Encounter (Signed)
Pt daughter advised take one extra coumadin tonight and Monday then other day continue same and follow in 2 weeks

## 2018-06-17 ENCOUNTER — Encounter: Payer: Self-pay | Admitting: Nurse Practitioner

## 2018-06-17 LAB — PROTIME-INR: INR: 2 — AB (ref 0.9–1.1)

## 2018-06-25 ENCOUNTER — Emergency Department: Payer: Medicare HMO

## 2018-06-25 ENCOUNTER — Ambulatory Visit: Payer: Self-pay | Admitting: Adult Health

## 2018-06-25 ENCOUNTER — Emergency Department
Admission: EM | Admit: 2018-06-25 | Discharge: 2018-06-25 | Disposition: A | Discharge status: Home / Self Care | Payer: Medicare HMO | Attending: Emergency Medicine | Admitting: Emergency Medicine

## 2018-06-25 DIAGNOSIS — S0990XA Unspecified injury of head, initial encounter: Secondary | ICD-10-CM | POA: Diagnosis not present

## 2018-06-25 DIAGNOSIS — Z96651 Presence of right artificial knee joint: Secondary | ICD-10-CM | POA: Insufficient documentation

## 2018-06-25 DIAGNOSIS — S43402A Unspecified sprain of left shoulder joint, initial encounter: Secondary | ICD-10-CM | POA: Insufficient documentation

## 2018-06-25 DIAGNOSIS — Z794 Long term (current) use of insulin: Secondary | ICD-10-CM | POA: Insufficient documentation

## 2018-06-25 DIAGNOSIS — Z23 Encounter for immunization: Secondary | ICD-10-CM | POA: Insufficient documentation

## 2018-06-25 DIAGNOSIS — Y92009 Unspecified place in unspecified non-institutional (private) residence as the place of occurrence of the external cause: Secondary | ICD-10-CM | POA: Diagnosis not present

## 2018-06-25 DIAGNOSIS — Y9384 Activity, sleeping: Secondary | ICD-10-CM | POA: Insufficient documentation

## 2018-06-25 DIAGNOSIS — W08XXXA Fall from other furniture, initial encounter: Secondary | ICD-10-CM | POA: Diagnosis not present

## 2018-06-25 DIAGNOSIS — Z9049 Acquired absence of other specified parts of digestive tract: Secondary | ICD-10-CM | POA: Diagnosis not present

## 2018-06-25 DIAGNOSIS — N183 Chronic kidney disease, stage 3 (moderate): Secondary | ICD-10-CM | POA: Diagnosis not present

## 2018-06-25 DIAGNOSIS — Z79899 Other long term (current) drug therapy: Secondary | ICD-10-CM | POA: Insufficient documentation

## 2018-06-25 DIAGNOSIS — Y998 Other external cause status: Secondary | ICD-10-CM | POA: Insufficient documentation

## 2018-06-25 DIAGNOSIS — I509 Heart failure, unspecified: Secondary | ICD-10-CM | POA: Insufficient documentation

## 2018-06-25 DIAGNOSIS — Z7901 Long term (current) use of anticoagulants: Secondary | ICD-10-CM | POA: Insufficient documentation

## 2018-06-25 DIAGNOSIS — I130 Hypertensive heart and chronic kidney disease with heart failure and stage 1 through stage 4 chronic kidney disease, or unspecified chronic kidney disease: Secondary | ICD-10-CM | POA: Diagnosis not present

## 2018-06-25 DIAGNOSIS — W19XXXA Unspecified fall, initial encounter: Secondary | ICD-10-CM | POA: Diagnosis not present

## 2018-06-25 DIAGNOSIS — E1122 Type 2 diabetes mellitus with diabetic chronic kidney disease: Secondary | ICD-10-CM | POA: Insufficient documentation

## 2018-06-25 DIAGNOSIS — S4992XA Unspecified injury of left shoulder and upper arm, initial encounter: Secondary | ICD-10-CM | POA: Diagnosis not present

## 2018-06-25 DIAGNOSIS — F17210 Nicotine dependence, cigarettes, uncomplicated: Secondary | ICD-10-CM | POA: Diagnosis not present

## 2018-06-25 DIAGNOSIS — M25512 Pain in left shoulder: Secondary | ICD-10-CM | POA: Diagnosis not present

## 2018-06-25 LAB — BASIC METABOLIC PANEL
ANION GAP: 7 (ref 5–15)
BUN: 15 mg/dL (ref 8–23)
CO2: 27 mmol/L (ref 22–32)
Calcium: 8.3 mg/dL — ABNORMAL LOW (ref 8.9–10.3)
Chloride: 106 mmol/L (ref 98–111)
Creatinine, Ser: 1.34 mg/dL — ABNORMAL HIGH (ref 0.44–1.00)
GFR calc Af Amer: 43 mL/min — ABNORMAL LOW (ref >60)
GFR, EST NON AFRICAN AMERICAN: 37 mL/min — AB (ref >60)
Glucose, Bld: 135 mg/dL — ABNORMAL HIGH (ref 70–99)
POTASSIUM: 3.9 mmol/L (ref 3.5–5.1)
SODIUM: 140 mmol/L (ref 135–145)

## 2018-06-25 LAB — CBC
HCT: 37.9 % (ref 35.0–47.0)
HEMOGLOBIN: 13 g/dL (ref 12.0–16.0)
MCH: 32.2 pg (ref 26.0–34.0)
MCHC: 34.2 g/dL (ref 32.0–36.0)
MCV: 94.2 fL (ref 80.0–100.0)
Platelets: 213 10*3/uL (ref 150–440)
RBC: 4.03 MIL/uL (ref 3.80–5.20)
RDW: 13.8 % (ref 11.5–14.5)
WBC: 10 10*3/uL (ref 3.6–11.0)

## 2018-06-25 LAB — PROTIME-INR
INR: 1.59
PROTHROMBIN TIME: 18.8 s — AB (ref 11.4–15.2)

## 2018-06-25 MED ORDER — TETANUS-DIPHTH-ACELL PERTUSSIS 5-2.5-18.5 LF-MCG/0.5 IM SUSP
0.5000 mL | Freq: Once | INTRAMUSCULAR | Status: AC
  Administered 2018-06-25: 0.5 mL via INTRAMUSCULAR
  Filled 2018-06-25: qty 0.5

## 2018-06-25 MED ORDER — ONDANSETRON HCL 4 MG/2ML IJ SOLN
4.0000 mg | Freq: Once | INTRAMUSCULAR | Status: AC
  Administered 2018-06-25: 4 mg via INTRAVENOUS
  Filled 2018-06-25: qty 2

## 2018-06-25 MED ORDER — MORPHINE SULFATE (PF) 2 MG/ML IV SOLN
2.0000 mg | Freq: Once | INTRAVENOUS | Status: AC
  Administered 2018-06-25: 2 mg via INTRAVENOUS
  Filled 2018-06-25: qty 1

## 2018-06-25 NOTE — ED Provider Notes (Signed)
[facility] Emergency Department Provider Note   ____________________________________________    I have reviewed the triage vital signs and the nursing notes.   HISTORY  Chief Complaint Fall    HPI [name] is a 77 y.o. female with a history of atrial fibrillation, congestive heart failure, diabetes on warfarin who presents after a fall.  Patient reports she typically sleeps on a couch and had a very vivid dream where she was jumping off a porch and she fell off the couch.  She did hit her head and has a hematoma over her left forehead.  Primarily however she complains of pain in the left shoulder.  She did suffer some abrasions to her left arm.  She denies chest wall pain.  No abdominal pain.  No hip or pelvis pain.  No back pain or neck pain.   Past Medical History:  Diagnosis Date  . Atrial fibrillation ([place])   . Congestive heart failure (CHF) ([place])   . Diabetes mellitus without complication ([place])   . GERD (gastroesophageal reflux disease)   . Hyperlipidemia   . Hypertension     Patient Active Problem List   Diagnosis Date Noted  . Sepsis ([place]) 01/12/2018  . Colitis 01/12/2018  . CKD (chronic kidney disease), stage III ([place]) 01/12/2018  . Diabetes ([place]) 12/28/2017  . AF (paroxysmal atrial fibrillation) ([place]) 12/28/2017  . Encounter for therapeutic drug monitoring 12/28/2017  . Primary generalized (osteo)arthritis 12/28/2017  . Hypertension 12/27/2017  . GERD (gastroesophageal reflux disease) 12/27/2017    Past Surgical History:  Procedure Laterality Date  . ABDOMINAL HYSTERECTOMY    . APPENDECTOMY    . CATARACT EXTRACTION    . CHOLECYSTECTOMY    . HERNIA REPAIR    . right knee replacement    . right nephroectomy      Prior to Admission medications   Medication Sig Start Date End Date Taking? Authorizing Provider  ACCU-CHEK FASTCLIX LANCETS [place]  05/26/17   [provider]  Alcohol Swabs (B-D SINGLE USE SWABS  REGULAR) PADS  07/05/17   [provider]  atorvastatin (LIPITOR) 10 MG tablet Take 1 tablet (10 mg total) by mouth every evening. 12/05/17   Ronnell Freshwater, NP  azithromycin (ZITHROMAX) 250 MG tablet Take one tab po qd Patient not taking: Reported on 03/13/2018 01/23/18   Lavera Guise, MD  citalopram (CELEXA) 40 MG tablet Take 1 tablet (40 mg total) by mouth daily. 12/05/17   Ronnell Freshwater, NP  digoxin (LANOXIN) 0.125 MG tablet Take 125 mcg by mouth daily. 11/03/16   [provider]  donepezil (ARICEPT) 5 MG tablet TAKE 1 TABLET BY MOUTH ONCE DAILY FOR MEMORY 02/06/18   Lavera Guise, MD  furosemide (LASIX) 20 MG tablet Take 20 mg by mouth daily. 09/27/16   [provider]  glimepiride (AMARYL) 2 MG tablet  02/05/15   [provider]  glucose blood (ACCU-CHEK GUIDE) test strip  07/01/17   [provider]  glucose blood test strip Use as instructed to check blood sugars twice a day.  E11.65 10/31/17   Lavera Guise, MD  Insulin Glargine (TOUJEO SOLOSTAR) 300 UNIT/ML SOPN Inject 20 mg into the skin daily.    [provider]  Insulin Pen Needle (BD PEN NEEDLE NANO U/F) 32G X 4 MM MISC  07/04/17   [provider]  liraglutide (VICTOZA) 18 MG/3ML SOPN Inject 0.2 mLs (1.2 mg total) into the skin daily. 03/21/18  Lavera Guise, MD  metoCLOPramide (REGLAN) 5 MG tablet Take one tab po bid for nausea and one at bed time prn 01/30/18   Lavera Guise, MD  pantoprazole (PROTONIX) 40 MG tablet Take 1 tablet (40 mg total) by mouth 2 (two) times daily. 03/13/18   Lavera Guise, MD  rOPINIRole (REQUIP) 0.5 MG tablet Take 1 tablet (0.5 mg total) by mouth every evening. 12/05/17   Ronnell Freshwater, NP  traMADol (ULTRAM) 50 MG tablet Take 1 tablet (50 mg total) by mouth 2 (two) times daily. 04/17/18   Ronnell Freshwater, NP  warfarin (COUMADIN) 4 MG tablet Take 1 tablet (4 mg total) by mouth See admin instructions. tk 1.5 tabs on Tuesday and Friday, and 1 tablet on  the rest of the days 03/13/18   Lavera Guise, MD     Allergies Patient has no known allergies.  Family History  Problem Relation Age of Onset  . Breast cancer Mother     Social History Social History   Tobacco Use  . Smoking status: Current Every Day Smoker    Packs/day: 1.00    Types: Cigarettes  . Smokeless tobacco: Never Used  Substance Use Topics  . Alcohol use: No  . Drug use: No    Review of Systems  Constitutional: No dizziness Eyes: No visual changes.  ENT: No neck pain Cardiovascular: Denies chest pain. Respiratory: Denies shortness of breath. Gastrointestinal: No abdominal pain.   Genitourinary: Negative for groin injury Musculoskeletal: As above Skin: Skin tear left elbow Neurological: Negative for headaches or weakness   ____________________________________________   PHYSICAL EXAM:  VITAL SIGNS: ED Triage Vitals [06/25/18 0654]  Enc Vitals Group     BP      Pulse      Resp      Temp 98 F (36.7 C)     Temp Source Oral     SpO2      Weight 61.2 kg (135 lb)     Height 1.524 m (5')     Head Circumference      Peak Flow      Pain Score 5     Pain Loc      Pain Edu?      Excl. in [place]?     Constitutional: Alert and oriented. No acute distress.  Eyes: Conjunctivae are normal.  Head: Hematoma left forehead, no bleeding Nose: No congestion/rhinnorhea. Mouth/Throat: Mucous membranes are moist.   Neck:  Painless ROM no vertebral tenderness to palpation Cardiovascular: Normal rate, irregular rhythm. Grossly normal heart sounds.  Good peripheral circulation.  Chest wall tenderness to palpation Respiratory: Normal respiratory effort.  No retractions. Gastrointestinal: Soft and nontender. No distention.    Musculoskeletal: Significant tenderness to left shoulder, suspect proximal humerus fracture, clavicles nontender, no bony abnormalities.  Full range of motion of all other extremities Neurologic:  Normal speech and language. No gross focal  neurologic deficits are appreciated.  Skin:  Skin is warm, dry.  Can tear noted to left elbow Psychiatric: Mood and affect are normal. Speech and behavior are normal.  ____________________________________________   LABS (all labs ordered are listed, but only abnormal results are displayed)  Labs Reviewed  BASIC METABOLIC PANEL - Abnormal; Notable for the following components:      Result Value   Glucose, Bld 135 (*)    Creatinine, Ser 1.34 (*)    Calcium 8.3 (*)    GFR calc non Af Amer 37 (*)    GFR calc  Af Amer 43 (*)    All other components within normal limits  PROTIME-INR - Abnormal; Notable for the following components:   Prothrombin Time 18.8 (*)    All other components within normal limits  CBC   ____________________________________________  EKG  None ____________________________________________  RADIOLOGY  X-ray shoulder negative for fracture CT head and shoulder negative for acute injury ____________________________________________   PROCEDURES  Procedure(s) performed: No  Procedures   Critical Care performed: No ____________________________________________   INITIAL IMPRESSION / ASSESSMENT AND PLAN / ED COURSE  Pertinent labs & imaging results that were available during my care of the patient were reviewed by me and considered in my medical decision making (see chart for details).  Patient presents after fall from a couch.  Exam is suspicious for proximal humerus fracture, IV morphine and IV Zofran given for pain.  Will send for x-ray of the left shoulder.  CT head is necessary given the patient is on blood thinners and has a significant hematoma to her left forehead.  We will send labs including INR  Initial x-ray of the shoulder was negative for fracture however the patient continues to have significant range of motion difficulties and pain.  Ordered CT scan to further evaluate  CT scan is reassuring.  Patient notes that she is able to move her arm  more smoothly now as well  Lab work is overall reassuring.  Patient placed in sling, skin tear dressed recommend ice, acetaminophen/ibuprofen as needed, outpatient follow-up with PCP    ____________________________________________   FINAL CLINICAL IMPRESSION(S) / ED DIAGNOSES  Final diagnoses:  Sprain of left shoulder, unspecified shoulder sprain type, initial encounter  Injury of head, initial encounter        Note:  This document was prepared using Dragon voice recognition software and may include unintentional dictation errors.    Lavonia Drafts, MD 06/25/18 1025

## 2018-06-25 NOTE — ED Notes (Signed)
Pt has a hematoma to her left forehead due to falling.

## 2018-06-25 NOTE — ED Triage Notes (Signed)
Pt came from home via EMS with reports of a fall after rolling off her couch. Pt was sleeping on couch when she rolled off onto wooden floor. Pt has obvious hematoma to left forehead and left shoulder pain with no deformity. No LOC. Pt has Hx of A-Fib. EMS placed a 20 in her right forearm. Pt is on blood thinners. Pt did have some nausea that has now resolved. Pt is alert and oriented x 4.

## 2018-07-01 ENCOUNTER — Other Ambulatory Visit: Payer: Self-pay | Admitting: Adult Health

## 2018-07-01 ENCOUNTER — Encounter: Payer: Self-pay | Admitting: Adult Health

## 2018-07-01 ENCOUNTER — Ambulatory Visit (INDEPENDENT_AMBULATORY_CARE_PROVIDER_SITE_OTHER): Payer: Medicare HMO | Admitting: Adult Health

## 2018-07-01 VITALS — BP 112/58 | HR 77 | Resp 16 | Ht 59.0 in | Wt 127.0 lb

## 2018-07-01 DIAGNOSIS — I482 Chronic atrial fibrillation: Secondary | ICD-10-CM | POA: Diagnosis not present

## 2018-07-01 DIAGNOSIS — I10 Essential (primary) hypertension: Secondary | ICD-10-CM | POA: Diagnosis not present

## 2018-07-01 DIAGNOSIS — I4891 Unspecified atrial fibrillation: Secondary | ICD-10-CM

## 2018-07-01 DIAGNOSIS — Z5181 Encounter for therapeutic drug level monitoring: Secondary | ICD-10-CM

## 2018-07-01 DIAGNOSIS — E1165 Type 2 diabetes mellitus with hyperglycemia: Secondary | ICD-10-CM | POA: Diagnosis not present

## 2018-07-01 DIAGNOSIS — M25512 Pain in left shoulder: Secondary | ICD-10-CM | POA: Diagnosis not present

## 2018-07-01 DIAGNOSIS — Z23 Encounter for immunization: Secondary | ICD-10-CM

## 2018-07-01 DIAGNOSIS — Z7901 Long term (current) use of anticoagulants: Secondary | ICD-10-CM | POA: Diagnosis not present

## 2018-07-01 LAB — PROTIME-INR: INR: 1.9 — AB (ref 0.9–1.1)

## 2018-07-01 MED ORDER — ROPINIROLE HCL 0.5 MG PO TABS: 0.5000 mg | ORAL_TABLET | Freq: Every evening | ORAL | 1 refills | Status: DC

## 2018-07-01 MED ORDER — FUROSEMIDE 20 MG PO TABS: 20.0000 mg | ORAL_TABLET | Freq: Every day | ORAL | 1 refills | Status: DC

## 2018-07-01 MED ORDER — TRAMADOL HCL 50 MG PO TABS: 50.0000 mg | ORAL_TABLET | Freq: Two times a day (BID) | ORAL | 0 refills | Status: DC

## 2018-07-01 MED ORDER — IBUPROFEN 600 MG PO TABS: 600.0000 mg | ORAL_TABLET | Freq: Three times a day (TID) | ORAL | 0 refills | Status: DC | PRN

## 2018-07-01 NOTE — Progress Notes (Signed)
[facility] [place], [place] [address]  Internal MEDICINE  Office Visit Note  Patient Name: [name]  [id number]  [id number]  Date of Service: 07/09/2018    Chief Complaint  Patient presents with  . Shoulder Pain    sprained shoulder  . Diabetes  . Hypertension  . Hyperlipidemia  . Atrial Fibrillation    1.9 this a.m   HPI Pt is here for recent hospital follow up.  Pt was asleep one week ago and was dreaming and fell off the couch.  When she woke up she had shoulder pain and head bruise.  She was diagnosed with a left shoulder sprain.  Her Head CT was clear.  She was sent home with a sling, and told to take tylenol and motrin.  She reports she has been doing well.  She is intermittently wearing the sling, and taking her arm out to perform ROM exercises.  She has been taking tylenol with no relief. She reports her tramadol is not working for the pain either.    Current Medication: Outpatient Encounter Medications as of 07/01/2018  Medication Sig  . ACCU-CHEK FASTCLIX LANCETS MISC   . Alcohol Swabs (B-D SINGLE USE SWABS REGULAR) PADS   . atorvastatin (LIPITOR) 10 MG tablet Take 1 tablet (10 mg total) by mouth every evening.  Marland Kitchen azithromycin (ZITHROMAX) 250 MG tablet Take one tab po qd  . citalopram (CELEXA) 40 MG tablet Take 1 tablet (40 mg total) by mouth daily.  . digoxin (LANOXIN) 0.125 MG tablet Take 125 mcg by mouth daily.  Marland Kitchen donepezil (ARICEPT) 5 MG tablet TAKE 1 TABLET BY MOUTH ONCE DAILY FOR MEMORY  . furosemide (LASIX) 20 MG tablet Take 1 tablet (20 mg total) by mouth daily.  Marland Kitchen glimepiride (AMARYL) 2 MG tablet   . glucose blood (ACCU-CHEK GUIDE) test strip   . glucose blood test strip Use as instructed to check blood sugars twice a day.  E11.65  . Insulin Glargine (TOUJEO SOLOSTAR) 300 UNIT/ML SOPN Inject 20 mg into the skin daily.  . Insulin Pen Needle (BD PEN NEEDLE NANO U/F) 32G X 4 MM MISC   . liraglutide (VICTOZA) 18 MG/3ML SOPN Inject  0.2 mLs (1.2 mg total) into the skin daily.  . metoCLOPramide (REGLAN) 5 MG tablet Take one tab po bid for nausea and one at bed time prn  . pantoprazole (PROTONIX) 40 MG tablet Take 1 tablet (40 mg total) by mouth 2 (two) times daily.  Marland Kitchen rOPINIRole (REQUIP) 0.5 MG tablet Take 1 tablet (0.5 mg total) by mouth every evening.  . warfarin (COUMADIN) 4 MG tablet Take 1 tablet (4 mg total) by mouth See admin instructions. tk 1.5 tabs on Tuesday and Friday, and 1 tablet on the rest of the days  . [DISCONTINUED] furosemide (LASIX) 20 MG tablet Take 20 mg by mouth daily.  . [DISCONTINUED] rOPINIRole (REQUIP) 0.5 MG tablet Take 1 tablet (0.5 mg total) by mouth every evening.  . [DISCONTINUED] traMADol (ULTRAM) 50 MG tablet Take 1 tablet (50 mg total) by mouth 2 (two) times daily.  . [DISCONTINUED] traMADol (ULTRAM) 50 MG tablet Take 1 tablet (50 mg total) by mouth 2 (two) times daily.  Marland Kitchen ibuprofen (ADVIL,MOTRIN) 600 MG tablet Take 1 tablet (600 mg total) by mouth every 8 (eight) hours as needed.   No facility-administered encounter medications on file as of 07/01/2018.     Surgical History: Past Surgical History:  Procedure Laterality Date  . ABDOMINAL HYSTERECTOMY    .  APPENDECTOMY    . CATARACT EXTRACTION    . CHOLECYSTECTOMY    . HERNIA REPAIR    . right knee replacement    . right nephroectomy      Medical History: Past Medical History:  Diagnosis Date  . Atrial fibrillation ([place])   . Congestive heart failure (CHF) ([place])   . Diabetes mellitus without complication ([place])   . GERD (gastroesophageal reflux disease)   . Hyperlipidemia   . Hypertension     Family History: Family History  Problem Relation Age of Onset  . Breast cancer Mother     Social History   Socioeconomic History  . Marital status: Married    Spouse name: Not on file  . Number of children: Not on file  . Years of education: Not on file  . Highest education level: Not on file  Occupational History  . Not on  file  Social Needs  . Financial resource strain: Not on file  . Food insecurity:    Worry: Not on file    Inability: Not on file  . Transportation needs:    Medical: Not on file    Non-medical: Not on file  Tobacco Use  . Smoking status: Current Every Day Smoker    Packs/day: 1.00    Types: Cigarettes  . Smokeless tobacco: Never Used  Substance and Sexual Activity  . Alcohol use: No  . Drug use: No  . Sexual activity: Not on file  Lifestyle  . Physical activity:    Days per week: Not on file    Minutes per session: Not on file  . Stress: Not on file  Relationships  . Social connections:    Talks on phone: Not on file    Gets together: Not on file    Attends religious service: Not on file    Active member of club or organization: Not on file    Attends meetings of clubs or organizations: Not on file    Relationship status: Not on file  . Intimate partner violence:    Fear of current or ex partner: Not on file    Emotionally abused: Not on file    Physically abused: Not on file    Forced sexual activity: Not on file  Other Topics Concern  . Not on file  Social History Narrative  . Not on file      Review of Systems  Constitutional: Negative for chills, fatigue and unexpected weight change.  HENT: Negative for congestion, rhinorrhea, sneezing and sore throat.   Eyes: Negative for photophobia, pain and redness.  Respiratory: Negative for cough, chest tightness and shortness of breath.   Cardiovascular: Negative for chest pain and palpitations.  Gastrointestinal: Negative for abdominal pain, constipation, diarrhea, nausea and vomiting.  Endocrine: Negative.   Genitourinary: Negative for dysuria and frequency.  Musculoskeletal: Negative for arthralgias, back pain, joint swelling and neck pain.       Left shoulder pain.  Skin: Negative for rash.  Allergic/Immunologic: Negative.   Neurological: Negative for tremors and numbness.  Hematological: Negative for  adenopathy. Does not bruise/bleed easily.  Psychiatric/Behavioral: Negative for behavioral problems and sleep disturbance. The patient is not nervous/anxious.     Vital Signs: BP (!) 112/58   Pulse 77   Resp 16   Ht 4\' 11"  (1.499 m)   Wt 127 lb (57.6 kg)   SpO2 96%   BMI 25.65 kg/m    Physical Exam  Constitutional: She is oriented to person, place, and time.  She appears well-developed and well-nourished. No distress.  HENT:  Head: Normocephalic and atraumatic.  Mouth/Throat: Oropharynx is clear and moist. No oropharyngeal exudate.  Eyes: Pupils are equal, round, and reactive to light. EOM are normal.  Neck: Normal range of motion. Neck supple. No JVD present. No tracheal deviation present. No thyromegaly present.  Cardiovascular: Normal rate, regular rhythm and normal heart sounds. Exam reveals no gallop and no friction rub.  No murmur heard. Pulmonary/Chest: Effort normal and breath sounds normal. No respiratory distress. She has no wheezes. She has no rales. She exhibits no tenderness.  Abdominal: Soft. There is no tenderness. There is no guarding.  Musculoskeletal:  ROM normal in RUE and BLE.  LUE has decreased ROM, and is in sling at this time.   Lymphadenopathy:    She has no cervical adenopathy.  Neurological: She is alert and oriented to person, place, and time. No cranial nerve deficit.  Skin: Skin is warm and dry. She is not diaphoretic.  Psychiatric: She has a normal mood and affect. Her behavior is normal. Judgment and thought content normal.  Nursing note and vitals reviewed.  Assessment/Plan: 1. Acute pain of left shoulder Continue to wear sling as directed.  Utilizing time to stretch and perform ROM exercises. - traMADol (ULTRAM) 50 MG tablet; Take 1 tablet (50 mg total) by mouth 2 (two) times daily.  Dispense: 45 tablet; Refill: 0 - Short course of NSAIDS, as pt is high risk of GI bleed ibuprofen (ADVIL,MOTRIN) 600 MG tablet; Take 1 tablet (600 mg total) by  mouth every 8 (eight) hours as needed.  Dispense: 30 tablet; Refill: 0  2. Atrial fibrillation, unspecified type (HCC) Stable, on coumadin.  Last INR 1.9. Take one extra dose and resume as before, follow up pt/inr Qmonth  3. Essential hypertension Controlled on current medication regimen.  4. Flu vaccine need - Flu Vaccine MDCK QUAD PF  5. Uncontrolled type 2 diabetes mellitus with hyperglycemia (HCC) Last hg aic is 8.8. Will monitor and check on next visiy  6. Encounter for therapeutic drug monitoring  Monthly PT/INR pt does have home monitor   General Counseling: Lilo verbalizes understanding of the findings of todays visit and agrees with plan of treatment. I have discussed any further diagnostic evaluation that may be needed or ordered today. We also reviewed her medications today. she has been encouraged to call the office with any questions or concerns that should arise related to todays visit.  Orders Placed This Encounter  Procedures  . Flu Vaccine MDCK QUAD PF   I have reviewed all medical records from hospital follow up including radiology reports and consults from other physicians. Appropriate follow up diagnostics will be scheduled as needed. Patient/ Family understands the plan of treatment. Time spent 30 minutes.   Dr Lavera Guise, MD Internal Medicine

## 2018-07-01 NOTE — Patient Instructions (Signed)
Muscle Strain A muscle strain (pulled muscle) happens when a muscle is stretched beyond normal length. It happens when a sudden, violent force stretches your muscle too far. Usually, a few of the fibers in your muscle are torn. Muscle strain is common in athletes. Recovery usually takes 1-2 weeks. Complete healing takes 5-6 weeks. Follow these instructions at home:  Follow the PRICE method of treatment to help your injury get better. Do this the first 2-3 days after the injury: ? Protect. Protect the muscle to keep it from getting injured again. ? Rest. Limit your activity and rest the injured body part. ? Ice. Put ice in a plastic bag. Place a towel between your skin and the bag. Then, apply the ice and leave it on from 15-20 minutes each hour. After the third day, switch to moist heat packs. ? Compression. Use a splint or elastic bandage on the injured area for comfort. Do not put it on too tightly. ? Elevate. Keep the injured body part above the level of your heart.  Only take medicine as told by your doctor.  Warm up before doing exercise to prevent future muscle strains. Contact a doctor if:  You have more pain or puffiness (swelling) in the injured area.  You feel numbness, tingling, or notice a loss of strength in the injured area. This information is not intended to replace advice given to you by your health care provider. Make sure you discuss any questions you have with your health care provider. Document Released: 07/18/2008 Document Revised: 03/16/2016 Document Reviewed: 05/08/2013 Elsevier Interactive Patient Education  2017 [employer].  

## 2018-07-02 ENCOUNTER — Telehealth: Payer: Self-pay | Admitting: Adult Health

## 2018-07-03 ENCOUNTER — Other Ambulatory Visit: Payer: Self-pay | Admitting: Adult Health

## 2018-07-03 DIAGNOSIS — M25512 Pain in left shoulder: Secondary | ICD-10-CM

## 2018-07-03 MED ORDER — TRAMADOL HCL 50 MG PO TABS: 50.0000 mg | ORAL_TABLET | Freq: Two times a day (BID) | ORAL | 0 refills | Status: DC

## 2018-07-03 NOTE — Telephone Encounter (Signed)
Complete

## 2018-07-05 ENCOUNTER — Other Ambulatory Visit: Payer: Self-pay

## 2018-07-05 ENCOUNTER — Telehealth: Payer: Self-pay

## 2018-07-05 NOTE — Telephone Encounter (Signed)
Pt advised we called in pres to [facility] and canceled from [facility]

## 2018-07-10 ENCOUNTER — Telehealth: Payer: Self-pay

## 2018-07-10 NOTE — Telephone Encounter (Signed)
lmom to call us back

## 2018-07-12 ENCOUNTER — Telehealth: Payer: Self-pay

## 2018-07-12 LAB — PROTIME-INR: INR: 1.6 — AB (ref 0.9–1.1)

## 2018-07-12 NOTE — Telephone Encounter (Signed)
CALLED PT DAUGHTER AND NOTIFIED HER THAT PT INR WAS 1.6 AND PER ADAM PT NEXT DOSAGE SHOULD BE ONE EXTRA DOSAGE AND THEN CONTINUE REGULAR DOSING.

## 2018-07-16 ENCOUNTER — Encounter: Payer: Self-pay | Admitting: Nurse Practitioner

## 2018-07-16 LAB — PROTIME-INR
INR: 1.4 — AB (ref 0.9–1.1)
INR: 3.5 — AB (ref 0.9–1.1)
INR: 2.3 — AB (ref 0.9–1.1)
INR: 1.7 — AB (ref 0.9–1.1)

## 2018-07-16 NOTE — Progress Notes (Signed)
SCANNED IN INR RESULT.

## 2018-07-16 NOTE — Progress Notes (Signed)
SCANNED IN INR RESULTS.

## 2018-07-16 NOTE — Progress Notes (Signed)
SCANNED IN INR RESULTS

## 2018-07-22 ENCOUNTER — Ambulatory Visit: Payer: Self-pay | Admitting: Adult Health

## 2018-07-26 ENCOUNTER — Encounter: Payer: Self-pay | Admitting: Internal Medicine

## 2018-07-26 LAB — PROTIME-INR: INR: 1.6 — AB (ref 0.9–1.1)

## 2018-07-26 NOTE — Progress Notes (Signed)
SCANNED IN INR RESULTS TESTED ON 07/17/18.

## 2018-08-05 ENCOUNTER — Telehealth: Payer: Self-pay

## 2018-08-05 NOTE — Telephone Encounter (Signed)
lmom to call us back

## 2018-08-05 NOTE — Telephone Encounter (Signed)
Pt daughter advised that coumadin 4 mg take 1 daily expect Tuesday and Friday take 2 tab po as per [place]

## 2018-08-06 ENCOUNTER — Encounter: Payer: Self-pay | Admitting: Adult Health

## 2018-08-06 LAB — PROTIME-INR: INR: 1.6 — AB (ref 0.9–1.1)

## 2018-08-12 DIAGNOSIS — Z7901 Long term (current) use of anticoagulants: Secondary | ICD-10-CM | POA: Diagnosis not present

## 2018-08-22 ENCOUNTER — Encounter: Payer: Self-pay | Admitting: Adult Health

## 2018-08-22 LAB — PROTIME-INR: INR: 2 — AB (ref 0.9–1.1)

## 2018-08-23 ENCOUNTER — Telehealth: Payer: Self-pay

## 2018-08-23 NOTE — Telephone Encounter (Signed)
CALLED PT ABOUT INR RESULTS AND WARFARIN DOSING PER ADAM PT NEEDS TO TAKE ONE EXTRA DOSAGE TODAY AND THEN RESUME REGULAR DOSING.   CALLED TWICE [name] AND INFORMED PT.

## 2018-08-27 ENCOUNTER — Encounter: Payer: Self-pay | Admitting: Adult Health

## 2018-08-27 LAB — PROTIME-INR: INR: 1.8 — AB (ref 0.9–1.1)

## 2018-09-03 LAB — PROTIME-INR: INR: 1.7 — AB (ref 0.9–1.1)

## 2018-09-04 ENCOUNTER — Telehealth: Payer: Self-pay

## 2018-09-04 NOTE — Telephone Encounter (Signed)
Pt daughter advised pt Inr was 1.7  As per adam take coumadin 4 mg daily and follow in week

## 2018-09-09 ENCOUNTER — Other Ambulatory Visit: Payer: Self-pay

## 2018-09-09 LAB — PROTIME-INR: INR: 1.6 — AB (ref 0.9–1.1)

## 2018-09-09 MED ORDER — ATORVASTATIN CALCIUM 10 MG PO TABS: 10.0000 mg | ORAL_TABLET | Freq: Every evening | ORAL | 1 refills | Status: DC

## 2018-09-10 ENCOUNTER — Other Ambulatory Visit: Payer: Self-pay | Admitting: Internal Medicine

## 2018-09-11 ENCOUNTER — Other Ambulatory Visit: Payer: Self-pay

## 2018-09-11 MED ORDER — GLIMEPIRIDE 2 MG PO TABS: ORAL_TABLET | ORAL | 1 refills | Status: DC

## 2018-09-16 DIAGNOSIS — Z7901 Long term (current) use of anticoagulants: Secondary | ICD-10-CM | POA: Diagnosis not present

## 2018-09-17 ENCOUNTER — Other Ambulatory Visit: Payer: Self-pay | Admitting: Adult Health

## 2018-09-17 DIAGNOSIS — M25512 Pain in left shoulder: Secondary | ICD-10-CM

## 2018-09-18 NOTE — Telephone Encounter (Signed)
Pt had next appt on 12/10 can send until then for her tramadol

## 2018-09-24 ENCOUNTER — Other Ambulatory Visit: Payer: Self-pay

## 2018-09-24 MED ORDER — GLIMEPIRIDE 2 MG PO TABS: ORAL_TABLET | ORAL | 1 refills | Status: DC

## 2018-09-30 ENCOUNTER — Telehealth: Payer: Self-pay

## 2018-09-30 ENCOUNTER — Other Ambulatory Visit: Payer: Self-pay

## 2018-09-30 NOTE — Telephone Encounter (Signed)
lmom to pt daughter to call back for inr is low

## 2018-10-01 ENCOUNTER — Telehealth: Payer: Self-pay

## 2018-10-01 ENCOUNTER — Ambulatory Visit (INDEPENDENT_AMBULATORY_CARE_PROVIDER_SITE_OTHER): Payer: Medicare HMO | Admitting: Adult Health

## 2018-10-01 ENCOUNTER — Encounter: Payer: Self-pay | Admitting: Adult Health

## 2018-10-01 VITALS — BP 108/52 | HR 67 | Resp 16 | Ht 59.0 in | Wt 120.0 lb

## 2018-10-01 DIAGNOSIS — E1165 Type 2 diabetes mellitus with hyperglycemia: Secondary | ICD-10-CM

## 2018-10-01 DIAGNOSIS — I4891 Unspecified atrial fibrillation: Secondary | ICD-10-CM

## 2018-10-01 DIAGNOSIS — I10 Essential (primary) hypertension: Secondary | ICD-10-CM

## 2018-10-01 DIAGNOSIS — M150 Primary generalized (osteo)arthritis: Secondary | ICD-10-CM | POA: Diagnosis not present

## 2018-10-01 DIAGNOSIS — M25512 Pain in left shoulder: Secondary | ICD-10-CM

## 2018-10-01 DIAGNOSIS — K219 Gastro-esophageal reflux disease without esophagitis: Secondary | ICD-10-CM | POA: Diagnosis not present

## 2018-10-01 LAB — POCT GLYCOSYLATED HEMOGLOBIN (HGB A1C): Hemoglobin A1C: 6.1 % — AB (ref 4.0–5.6)

## 2018-10-01 MED ORDER — TRAMADOL HCL 50 MG PO TABS: 50.0000 mg | ORAL_TABLET | Freq: Two times a day (BID) | ORAL | 0 refills | Status: DC | PRN

## 2018-10-01 NOTE — Telephone Encounter (Signed)
Spoke with pt daughter for her Inr is low as per daughter pt had appt today and also she ate cabbage two days ago we can discuss with pt today her Inr

## 2018-10-01 NOTE — Progress Notes (Signed)
[facility], [place] [address]  Internal MEDICINE  Office Visit Note  Patient Name: [name]  [id number]  [id number]  Date of Service: 10/01/2018  Chief Complaint  Patient presents with  . Neck Pain    still hurts   . Shoulder Pain  . Atrial Fibrillation  . Quality Metric Gaps    eye exam  . Diabetes  . Hypertension  . Hyperlipidemia    HPI Pt is here for follow up on shoulder pain, neck pain, diabetes, hypertension, hyperlipidemia, and atrial fibrillation.  Patient shoulders continue to hurt.  Patient did have a fall a few months back which caused some acute shoulder pain however patient also has chronic shoulder, back and knee pain.  Her diabetes appears well controlled at this time with a hemoglobin A1c of 6.1.  Her hypertension is well controlled and her blood pressure is currently low at 108/52.  She has had a recent lipid panel however she continues to take her hyperlipidemia medication with no difficulty.    Current Medication: Outpatient Encounter Medications as of 10/01/2018  Medication Sig  . ACCU-CHEK FASTCLIX LANCETS MISC   . Alcohol Swabs (B-D SINGLE USE SWABS REGULAR) PADS USE AS DIRECTED TWICE DAILY  . atorvastatin (LIPITOR) 10 MG tablet Take 1 tablet (10 mg total) by mouth every evening.  . citalopram (CELEXA) 40 MG tablet Take 1 tablet (40 mg total) by mouth daily.  . digoxin (LANOXIN) 0.125 MG tablet Take 125 mcg by mouth daily.  Marland Kitchen donepezil (ARICEPT) 5 MG tablet TAKE 1 TABLET BY MOUTH ONCE DAILY FOR MEMORY  . furosemide (LASIX) 20 MG tablet Take 1 tablet (20 mg total) by mouth daily.  Marland Kitchen glimepiride (AMARYL) 2 MG tablet Take 1 tab po daily with supper  . glucose blood (ACCU-CHEK GUIDE) test strip   . glucose blood test strip Use as instructed to check blood sugars twice a day.  E11.65  . Insulin Glargine (TOUJEO SOLOSTAR) 300 UNIT/ML SOPN Inject 20 mg into the skin daily.  . Insulin Pen Needle (BD PEN NEEDLE NANO U/F)  32G X 4 MM MISC   . liraglutide (VICTOZA) 18 MG/3ML SOPN Inject 0.2 mLs (1.2 mg total) into the skin daily.  . pantoprazole (PROTONIX) 40 MG tablet Take 1 tablet (40 mg total) by mouth 2 (two) times daily.  Marland Kitchen rOPINIRole (REQUIP) 0.5 MG tablet Take 1 tablet (0.5 mg total) by mouth every evening.  Marland Kitchen TOUJEO SOLOSTAR 300 UNIT/ML SOPN INJECT  16  TO  18 UNITS SUBCUTANEOUSLY AT BEDTIME  ( EXPIRES 42 DAYS AFTER OPENING  )  . warfarin (COUMADIN) 4 MG tablet Take 1 tablet (4 mg total) by mouth See admin instructions. tk 1.5 tabs on Tuesday and Friday, and 1 tablet on the rest of the days  . metoCLOPramide (REGLAN) 5 MG tablet Take one tab po bid for nausea and one at bed time prn (Patient not taking: Reported on 10/01/2018)  . traMADol (ULTRAM) 50 MG tablet Take 1 tablet (50 mg total) by mouth 2 (two) times daily as needed. for pain  . [DISCONTINUED] azithromycin (ZITHROMAX) 250 MG tablet Take one tab po qd (Patient not taking: Reported on 10/01/2018)  . [DISCONTINUED] ibuprofen (ADVIL,MOTRIN) 600 MG tablet Take 1 tablet (600 mg total) by mouth every 8 (eight) hours as needed. (Patient not taking: Reported on 10/01/2018)  . [DISCONTINUED] traMADol (ULTRAM) 50 MG tablet TAKE 1 TABLET BY MOUTH TWICE DAILY AS NEEDED FOR PAIN (Patient not taking: Reported on  10/01/2018)   No facility-administered encounter medications on file as of 10/01/2018.     Surgical History: Past Surgical History:  Procedure Laterality Date  . ABDOMINAL HYSTERECTOMY    . APPENDECTOMY    . CATARACT EXTRACTION    . CHOLECYSTECTOMY    . HERNIA REPAIR    . right knee replacement    . right nephroectomy      Medical History: Past Medical History:  Diagnosis Date  . Atrial fibrillation ([place])   . Congestive heart failure (CHF) ([place])   . Diabetes mellitus without complication ([place])   . GERD (gastroesophageal reflux disease)   . Hyperlipidemia   . Hypertension     Family History: Family History  Problem Relation Age of Onset   . Breast cancer Mother     Social History   Socioeconomic History  . Marital status: Married    Spouse name: Not on file  . Number of children: Not on file  . Years of education: Not on file  . Highest education level: Not on file  Occupational History  . Not on file  Social Needs  . Financial resource strain: Not on file  . Food insecurity:    Worry: Not on file    Inability: Not on file  . Transportation needs:    Medical: Not on file    Non-medical: Not on file  Tobacco Use  . Smoking status: Current Every Day Smoker    Packs/day: 1.00    Types: Cigarettes  . Smokeless tobacco: Never Used  Substance and Sexual Activity  . Alcohol use: No  . Drug use: No  . Sexual activity: Not on file  Lifestyle  . Physical activity:    Days per week: Not on file    Minutes per session: Not on file  . Stress: Not on file  Relationships  . Social connections:    Talks on phone: Not on file    Gets together: Not on file    Attends religious service: Not on file    Active member of club or organization: Not on file    Attends meetings of clubs or organizations: Not on file    Relationship status: Not on file  . Intimate partner violence:    Fear of current or ex partner: Not on file    Emotionally abused: Not on file    Physically abused: Not on file    Forced sexual activity: Not on file  Other Topics Concern  . Not on file  Social History Narrative  . Not on file      Review of Systems  Constitutional: Negative for chills, fatigue and unexpected weight change.  HENT: Negative for congestion, rhinorrhea, sneezing and sore throat.   Eyes: Negative for photophobia, pain and redness.  Respiratory: Negative for cough, chest tightness and shortness of breath.   Cardiovascular: Negative for chest pain and palpitations.  Gastrointestinal: Negative for abdominal pain, constipation, diarrhea, nausea and vomiting.  Endocrine: Negative.   Genitourinary: Negative for dysuria and  frequency.  Musculoskeletal: Positive for neck pain. Negative for arthralgias, back pain and joint swelling.       Shoulder pain  Skin: Negative for rash.  Allergic/Immunologic: Negative.   Neurological: Negative for tremors and numbness.  Hematological: Negative for adenopathy. Does not bruise/bleed easily.  Psychiatric/Behavioral: Negative for behavioral problems and sleep disturbance. The patient is not nervous/anxious.     Vital Signs: BP (!) 108/52 (BP Location: Left Arm, Patient Position: Sitting, Cuff Size: Normal)  Pulse 67   Resp 16   Ht 4\' 11"  (1.499 m)   Wt 120 lb (54.4 kg)   SpO2 97%   BMI 24.24 kg/m    Physical Exam  Constitutional: She is oriented to person, place, and time. She appears well-developed and well-nourished. No distress.  HENT:  Head: Normocephalic and atraumatic.  Mouth/Throat: Oropharynx is clear and moist. No oropharyngeal exudate.  Eyes: Pupils are equal, round, and reactive to light. EOM are normal.  Neck: Normal range of motion. Neck supple. No JVD present. No tracheal deviation present. No thyromegaly present.  Cardiovascular: Normal rate, regular rhythm and normal heart sounds. Exam reveals no gallop and no friction rub.  No murmur heard. Pulmonary/Chest: Effort normal and breath sounds normal. No respiratory distress. She has no wheezes. She has no rales. She exhibits no tenderness.  Abdominal: Soft. There is no tenderness. There is no guarding.  Musculoskeletal: Normal range of motion.  Lymphadenopathy:    She has no cervical adenopathy.  Neurological: She is alert and oriented to person, place, and time. No cranial nerve deficit.  Skin: Skin is warm and dry. She is not diaphoretic.  Psychiatric: She has a normal mood and affect. Her behavior is normal. Judgment and thought content normal.  Nursing note and vitals reviewed.  Assessment/Plan: 1. Uncontrolled type 2 diabetes mellitus with hyperglycemia (HCC) Patient's A1c is stable at 6.1.   Patient should continue taking medications as prescribed. - POCT HgB A1C  2. Acute pain of left shoulder Patient changed to report pain in left shoulder.  Pain is becoming chronic and is still intermittent.  Patient reports good results using tramadol.  3. Essential hypertension Stable, continue current medications as prescribed  4. Atrial fibrillation, unspecified type ([place]) Stable, continue present management  5. Primary generalized (osteo)arthritis Patient is tramadol refilled at this time. - traMADol (ULTRAM) 50 MG tablet; Take 1 tablet (50 mg total) by mouth 2 (two) times daily as needed. for pain  Dispense: 45 tablet; Refill: 0  6. Gastroesophageal reflux disease without esophagitis Stable, continue current medications.  General Counseling: Percilla verbalizes understanding of the findings of todays visit and agrees with plan of treatment. I have discussed any further diagnostic evaluation that may be needed or ordered today. We also reviewed her medications today. she has been encouraged to call the office with any questions or concerns that should arise related to todays visit.    Orders Placed This Encounter  Procedures  . POCT HgB A1C    Meds ordered this encounter  Medications  . traMADol (ULTRAM) 50 MG tablet    Sig: Take 1 tablet (50 mg total) by mouth 2 (two) times daily as needed. for pain    Dispense:  45 tablet    Refill:  0    Time spent: 25 Minutes   This patient was seen by Orson Gear AGNP-C in Collaboration with Dr Lavera Guise as a part of collaborative care agreement     Kendell Bane AGNP-C Internal medicine

## 2018-10-01 NOTE — Patient Instructions (Signed)
Shoulder Pain Many things can cause shoulder pain, including:  An injury.  Moving the arm in the same way again and again (overuse).  Joint pain (arthritis).  Follow these instructions at home: Take these actions to help with your pain:  Squeeze a soft ball or a foam pad as much as you can. This helps to prevent swelling. It also makes the arm stronger.  Take over-the-counter and prescription medicines only as told by your doctor.  If told, put ice on the area: ? Put ice in a plastic bag. ? Place a towel between your skin and the bag. ? Leave the ice on for 20 minutes, 2-3 times per day. Stop putting on ice if it does not help with the pain.  If you were given a shoulder sling or immobilizer: ? Wear it as told. ? Remove it to shower or bathe. ? Move your arm as little as possible. ? Keep your hand moving. This helps prevent swelling.  Contact a doctor if:  Your pain gets worse.  Medicine does not help your pain.  You have new pain in your arm, hand, or fingers. Get help right away if:  Your arm, hand, or fingers: ? Tingle. ? Are numb. ? Are swollen. ? Are painful. ? Turn white or blue. This information is not intended to replace advice given to you by your health care provider. Make sure you discuss any questions you have with your health care provider. Document Released: 03/27/2008 Document Revised: 06/04/2016 Document Reviewed: 02/01/2015 Elsevier Interactive Patient Education  2018 Elsevier Inc.  

## 2018-10-07 ENCOUNTER — Encounter: Payer: Self-pay | Admitting: Adult Health

## 2018-10-07 LAB — PROTIME-INR
INR: 1.4 — AB (ref 0.9–1.1)
INR: 1.8 — AB (ref 0.9–1.1)

## 2018-10-14 ENCOUNTER — Other Ambulatory Visit: Payer: Self-pay

## 2018-10-14 MED ORDER — CITALOPRAM HYDROBROMIDE 40 MG PO TABS: 40.0000 mg | ORAL_TABLET | Freq: Every day | ORAL | 1 refills | Status: DC

## 2018-10-22 ENCOUNTER — Ambulatory Visit (INDEPENDENT_AMBULATORY_CARE_PROVIDER_SITE_OTHER): Payer: Medicare HMO

## 2018-10-22 DIAGNOSIS — I480 Paroxysmal atrial fibrillation: Secondary | ICD-10-CM

## 2018-10-22 NOTE — Progress Notes (Signed)
Pt INR 2.0 continue same med recheck next week

## 2018-10-29 DIAGNOSIS — Z7901 Long term (current) use of anticoagulants: Secondary | ICD-10-CM | POA: Diagnosis not present

## 2018-10-30 ENCOUNTER — Encounter: Payer: Self-pay | Admitting: Adult Health

## 2018-10-30 LAB — PROTIME-INR: INR: 2 — AB (ref 0.9–1.1)

## 2018-10-31 ENCOUNTER — Ambulatory Visit (INDEPENDENT_AMBULATORY_CARE_PROVIDER_SITE_OTHER): Payer: Medicare HMO

## 2018-10-31 DIAGNOSIS — I480 Paroxysmal atrial fibrillation: Secondary | ICD-10-CM

## 2018-10-31 NOTE — Progress Notes (Signed)
Pt Inr was 2.2 continue same med as prescribed

## 2018-11-08 ENCOUNTER — Encounter: Payer: Self-pay | Admitting: Nurse Practitioner

## 2018-11-08 LAB — PROTIME-INR: INR: 2.2 — AB (ref 0.9–1.1)

## 2018-11-11 LAB — PROTIME-INR: INR: 1.9 — AB (ref 0.9–1.1)

## 2018-11-12 ENCOUNTER — Ambulatory Visit (INDEPENDENT_AMBULATORY_CARE_PROVIDER_SITE_OTHER): Payer: Medicare HMO

## 2018-11-12 DIAGNOSIS — I480 Paroxysmal atrial fibrillation: Secondary | ICD-10-CM

## 2018-11-14 NOTE — Progress Notes (Signed)
Pt inr was 1.9  Continue same  Med for now we can adjust on next week inr result

## 2018-11-21 ENCOUNTER — Ambulatory Visit: Payer: Self-pay

## 2018-11-21 ENCOUNTER — Encounter: Payer: Self-pay | Admitting: Emergency Medicine

## 2018-11-21 ENCOUNTER — Other Ambulatory Visit: Payer: Self-pay

## 2018-11-21 ENCOUNTER — Emergency Department
Admission: EM | Admit: 2018-11-21 | Discharge: 2018-11-21 | Disposition: A | Discharge status: Home / Self Care | Payer: Medicare HMO | Attending: Emergency Medicine | Admitting: Emergency Medicine

## 2018-11-21 ENCOUNTER — Emergency Department: Payer: Medicare HMO

## 2018-11-21 DIAGNOSIS — Z96651 Presence of right artificial knee joint: Secondary | ICD-10-CM | POA: Insufficient documentation

## 2018-11-21 DIAGNOSIS — Y998 Other external cause status: Secondary | ICD-10-CM | POA: Insufficient documentation

## 2018-11-21 DIAGNOSIS — I130 Hypertensive heart and chronic kidney disease with heart failure and stage 1 through stage 4 chronic kidney disease, or unspecified chronic kidney disease: Secondary | ICD-10-CM | POA: Insufficient documentation

## 2018-11-21 DIAGNOSIS — Z7901 Long term (current) use of anticoagulants: Secondary | ICD-10-CM | POA: Diagnosis not present

## 2018-11-21 DIAGNOSIS — Y9289 Other specified places as the place of occurrence of the external cause: Secondary | ICD-10-CM | POA: Insufficient documentation

## 2018-11-21 DIAGNOSIS — W010XXA Fall on same level from slipping, tripping and stumbling without subsequent striking against object, initial encounter: Secondary | ICD-10-CM | POA: Insufficient documentation

## 2018-11-21 DIAGNOSIS — Z794 Long term (current) use of insulin: Secondary | ICD-10-CM | POA: Diagnosis not present

## 2018-11-21 DIAGNOSIS — E1122 Type 2 diabetes mellitus with diabetic chronic kidney disease: Secondary | ICD-10-CM | POA: Insufficient documentation

## 2018-11-21 DIAGNOSIS — Z79899 Other long term (current) drug therapy: Secondary | ICD-10-CM | POA: Insufficient documentation

## 2018-11-21 DIAGNOSIS — M25512 Pain in left shoulder: Secondary | ICD-10-CM | POA: Diagnosis not present

## 2018-11-21 DIAGNOSIS — Y9301 Activity, walking, marching and hiking: Secondary | ICD-10-CM | POA: Insufficient documentation

## 2018-11-21 DIAGNOSIS — F17210 Nicotine dependence, cigarettes, uncomplicated: Secondary | ICD-10-CM | POA: Insufficient documentation

## 2018-11-21 DIAGNOSIS — S40012A Contusion of left shoulder, initial encounter: Secondary | ICD-10-CM | POA: Insufficient documentation

## 2018-11-21 DIAGNOSIS — N183 Chronic kidney disease, stage 3 (moderate): Secondary | ICD-10-CM | POA: Diagnosis not present

## 2018-11-21 DIAGNOSIS — W19XXXA Unspecified fall, initial encounter: Secondary | ICD-10-CM

## 2018-11-21 DIAGNOSIS — S4992XA Unspecified injury of left shoulder and upper arm, initial encounter: Secondary | ICD-10-CM | POA: Diagnosis not present

## 2018-11-21 DIAGNOSIS — I509 Heart failure, unspecified: Secondary | ICD-10-CM | POA: Diagnosis not present

## 2018-11-21 MED ORDER — TRAMADOL HCL 50 MG PO TABS
50.0000 mg | ORAL_TABLET | Freq: Once | ORAL | Status: AC
  Administered 2018-11-21: 50 mg via ORAL
  Filled 2018-11-21: qty 1

## 2018-11-21 MED ORDER — LIDOCAINE 5 % EX PTCH: 1.0000 | MEDICATED_PATCH | Freq: Two times a day (BID) | CUTANEOUS | 0 refills | Status: AC

## 2018-11-21 MED ORDER — LIDOCAINE 5 % EX PTCH
1.0000 | MEDICATED_PATCH | CUTANEOUS | Status: DC
  Administered 2018-11-21: 1 via TRANSDERMAL
  Filled 2018-11-21: qty 1

## 2018-11-21 NOTE — ED Provider Notes (Signed)
[facility] Emergency Department Provider Note   ____________________________________________   First MD Initiated Contact with Patient 11/21/18 [phone]     (approximate)  I have reviewed the triage vital signs and the nursing notes.   HISTORY  Chief Complaint Fall and Shoulder Injury    HPI [name] is a 78 y.o. female left shoulder pain secondary to a slip and fall.  Patient slipped on icy ramp and landed on her left shoulder.  Patient states decreased range of motion noted by complaint of pain.  Patient denies loss of sensation.  No palliative measures for complaint.  Patient rates pain as a 5/10.  Patient described the pain as "achy".    Past Medical History:  Diagnosis Date  . Atrial fibrillation ([place])   . Congestive heart failure (CHF) ([place])   . Diabetes mellitus without complication ([place])   . GERD (gastroesophageal reflux disease)   . Hyperlipidemia   . Hypertension     Patient Active Problem List   Diagnosis Date Noted  . Sepsis ([place]) 01/12/2018  . Colitis 01/12/2018  . CKD (chronic kidney disease), stage III ([place]) 01/12/2018  . Diabetes ([place]) 12/28/2017  . AF (paroxysmal atrial fibrillation) ([place]) 12/28/2017  . Encounter for therapeutic drug monitoring 12/28/2017  . Primary generalized (osteo)arthritis 12/28/2017  . Hypertension 12/27/2017  . GERD (gastroesophageal reflux disease) 12/27/2017    Past Surgical History:  Procedure Laterality Date  . ABDOMINAL HYSTERECTOMY    . APPENDECTOMY    . CATARACT EXTRACTION    . CHOLECYSTECTOMY    . HERNIA REPAIR    . right knee replacement    . right nephroectomy      Prior to Admission medications   Medication Sig Start Date End Date Taking? Authorizing Provider  ACCU-CHEK FASTCLIX LANCETS [place]  05/26/17   [provider]  Alcohol Swabs (B-D SINGLE USE SWABS REGULAR) PADS USE AS DIRECTED TWICE DAILY 09/11/18   Kendell Bane, NP  atorvastatin (LIPITOR) 10 MG tablet Take 1  tablet (10 mg total) by mouth every evening. 09/09/18   Kendell Bane, NP  citalopram (CELEXA) 40 MG tablet Take 1 tablet (40 mg total) by mouth daily. 10/14/18   Kendell Bane, NP  digoxin (LANOXIN) 0.125 MG tablet Take 125 mcg by mouth daily. 11/03/16   [provider]  donepezil (ARICEPT) 5 MG tablet TAKE 1 TABLET BY MOUTH ONCE DAILY FOR MEMORY 02/06/18   Lavera Guise, MD  furosemide (LASIX) 20 MG tablet Take 1 tablet (20 mg total) by mouth daily. 07/01/18   Kendell Bane, NP  glimepiride (AMARYL) 2 MG tablet Take 1 tab po daily with supper 09/24/18   Lavera Guise, MD  glucose blood (ACCU-CHEK GUIDE) test strip  07/01/17   [provider]  glucose blood test strip Use as instructed to check blood sugars twice a day.  E11.65 10/31/17   Lavera Guise, MD  Insulin Glargine (TOUJEO SOLOSTAR) 300 UNIT/ML SOPN Inject 20 mg into the skin daily.    [provider]  Insulin Pen Needle (BD PEN NEEDLE NANO U/F) 32G X 4 MM MISC  07/04/17   [provider]  lidocaine (LIDODERM) 5 % Place 1 patch onto the skin every 12 (twelve) hours. Remove & Discard patch within 12 hours or as directed by MD 11/21/18 11/21/19  Sable Feil, PA-C  liraglutide (VICTOZA) 18 MG/3ML SOPN Inject 0.2 mLs (1.2 mg total) into the skin daily. 03/21/18   Clayborn Bigness  M, MD  metoCLOPramide (REGLAN) 5 MG tablet Take one tab po bid for nausea and one at bed time prn Patient not taking: Reported on 10/01/2018 01/30/18   Lavera Guise, MD  pantoprazole (PROTONIX) 40 MG tablet Take 1 tablet (40 mg total) by mouth 2 (two) times daily. 03/13/18   Lavera Guise, MD  rOPINIRole (REQUIP) 0.5 MG tablet Take 1 tablet (0.5 mg total) by mouth every evening. 07/01/18   [place], Audie Clear, NP  TOUJEO SOLOSTAR 300 UNIT/ML SOPN INJECT  16  TO  18 UNITS SUBCUTANEOUSLY AT BEDTIME  ( EXPIRES 42 DAYS AFTER OPENING  ) 09/11/18   Kendell Bane, NP  traMADol (ULTRAM) 50 MG tablet Take 1 tablet (50 mg total) by mouth 2 (two)  times daily as needed. for pain 10/01/18   Kendell Bane, NP  warfarin (COUMADIN) 4 MG tablet Take 1 tablet (4 mg total) by mouth See admin instructions. tk 1.5 tabs on Tuesday and Friday, and 1 tablet on the rest of the days 03/13/18   Lavera Guise, MD    Allergies Patient has no known allergies.  Family History  Problem Relation Age of Onset  . Breast cancer Mother     Social History Social History   Tobacco Use  . Smoking status: Current Every Day Smoker    Packs/day: 1.00    Types: Cigarettes  . Smokeless tobacco: Never Used  Substance Use Topics  . Alcohol use: No  . Drug use: No    Review of Systems  Constitutional: No fever/chills Eyes: No visual changes. ENT: No sore throat. Cardiovascular: Denies chest pain. Respiratory: Denies shortness of breath. Gastrointestinal: No abdominal pain.  No nausea, no vomiting.  No diarrhea.  No constipation. Genitourinary: Negative for dysuria. Musculoskeletal: Left shoulder pain. Skin: Negative for rash. Neurological: Negative for headaches, focal weakness or numbness. Endocrine:  Diabetes, hyperlipidemia, and hypertension.   ____________________________________________   PHYSICAL EXAM:  VITAL SIGNS: ED Triage Vitals  Enc Vitals Group     BP 11/21/18 0842 (!) 126/40     Pulse Rate 11/21/18 0842 72     Resp 11/21/18 0842 16     Temp 11/21/18 0842 97.9 F (36.6 C)     Temp Source 11/21/18 0842 Oral     SpO2 11/21/18 0842 98 %     Weight 11/21/18 0842 120 lb (54.4 kg)     Height 11/21/18 0842 4\' 10"  (1.473 m)     Head Circumference --      Peak Flow --      Pain Score 11/21/18 0834 5     Pain Loc --      Pain Edu? --      Excl. in [place]? --     Constitutional: Alert and oriented. Well appearing and in no acute distress. Cardiovascular: Normal rate, regular rhythm. Grossly normal heart sounds.  Good peripheral circulation. Respiratory: Normal respiratory effort.  No retractions. Lungs CTAB. Gastrointestinal:  Soft and nontender. No distention. No abdominal bruits. No CVA tenderness. Musculoskeletal: No obvious deformity to the left shoulder.  Patient is moderate guarding palpation at the superior aspect of the humerus.  Patient has decreased range of motion with abduction which is limited by complaint of pain.   Neurologic:  Normal speech and language. No gross focal neurologic deficits are appreciated. No gait instability. Skin:  Skin is warm, dry and intact. No rash noted.  No abrasions no ecchymosis. Psychiatric: Mood and affect are normal. Speech and behavior are  normal.  ____________________________________________   LABS (all labs ordered are listed, but only abnormal results are displayed)  Labs Reviewed - No data to display ____________________________________________  EKG   ____________________________________________  RADIOLOGY  ED MD interpretation:    Official radiology report(s): Dg Shoulder Left  Result Date: 11/21/2018 CLINICAL DATA:  Acute left shoulder pain after fall yesterday. EXAM: LEFT SHOULDER - 2+ VIEW COMPARISON:  CT scan and radiographs of June 25, 2018. FINDINGS: There is no evidence of fracture or dislocation. There is development of curvilinear calcifications over the left humeral head concerning for calcific tendinosis. There also appears to be increased resorption involving the distal end of the left clavicle concerning for worsening degenerative change. Soft tissues are unremarkable. IMPRESSION: Increased resorption involving distal end of left clavicle concerning for worsening degenerative change. Interval development of curvilinear calcifications over left humeral head concerning for calcific tendinosis. No acute fracture or dislocation is noted. Electronically Signed   By: Marijo Conception, M.D.   On: 11/21/2018 09:22    ____________________________________________   PROCEDURES  Procedure(s) performed: None  Procedures  Critical Care performed:  No  ____________________________________________   INITIAL IMPRESSION / ASSESSMENT AND PLAN / ED COURSE  As part of my medical decision making, I reviewed the following data within the electronic MEDICAL RECORD NUMBER     Left shoulder pain secondary to contusion.  Discussed x-ray findings with patient.  Patient placed in arm sling and given discharge care instruction.  Patient advised use Lidoderm patches as directed.      ____________________________________________   FINAL CLINICAL IMPRESSION(S) / ED DIAGNOSES  Final diagnoses:  Contusion of left shoulder, initial encounter  Fall, initial encounter     ED Discharge Orders         Ordered    lidocaine (LIDODERM) 5 %  Every 12 hours     11/21/18 0932           Note:  This document was prepared using Dragon voice recognition software and may include unintentional dictation errors.    Sable Feil, PA-C 11/21/18 [id number]    Delman Kitten, MD 11/21/18 2128

## 2018-11-21 NOTE — ED Triage Notes (Signed)
Fell this morning when she slipped on icy ramp.  Fell on left shoulder.

## 2018-11-21 NOTE — Discharge Instructions (Signed)
Follow discharge care instructions. 

## 2018-11-21 NOTE — ED Notes (Signed)
X-ray at bedside

## 2018-11-25 ENCOUNTER — Telehealth: Payer: Self-pay

## 2018-11-25 ENCOUNTER — Ambulatory Visit (INDEPENDENT_AMBULATORY_CARE_PROVIDER_SITE_OTHER): Payer: Medicare HMO | Admitting: Adult Health

## 2018-11-25 ENCOUNTER — Ambulatory Visit: Payer: Self-pay | Admitting: Adult Health

## 2018-11-25 ENCOUNTER — Encounter: Payer: Self-pay | Admitting: Adult Health

## 2018-11-25 VITALS — BP 130/70 | HR 76 | Resp 16 | Ht <= 58 in | Wt 121.0 lb

## 2018-11-25 DIAGNOSIS — Z7901 Long term (current) use of anticoagulants: Secondary | ICD-10-CM

## 2018-11-25 DIAGNOSIS — I10 Essential (primary) hypertension: Secondary | ICD-10-CM

## 2018-11-25 DIAGNOSIS — E1122 Type 2 diabetes mellitus with diabetic chronic kidney disease: Secondary | ICD-10-CM | POA: Diagnosis not present

## 2018-11-25 DIAGNOSIS — N184 Chronic kidney disease, stage 4 (severe): Secondary | ICD-10-CM | POA: Diagnosis not present

## 2018-11-25 NOTE — Progress Notes (Signed)
[facility] [place], [place] [address]  Internal MEDICINE  Office Visit Note  Patient Name: [name]  [id number]  [id number]  Date of Service: 12/16/2018  Chief Complaint  Patient presents with  . Diabetes    HPI Pt is here for follow up on DM, INR.  Patient's most recent INR is 2.0.  Patient denies any issues at this time.  She reports her diabetes is well controlled with her last AC being 6.1 just over a month ago.  Patient denies any chest pain, shortness, palpitations or any other issues at this time.    Current Medication: Outpatient Encounter Medications as of 11/25/2018  Medication Sig  . ACCU-CHEK FASTCLIX LANCETS MISC   . atorvastatin (LIPITOR) 10 MG tablet Take 1 tablet (10 mg total) by mouth every evening.  . citalopram (CELEXA) 40 MG tablet Take 1 tablet (40 mg total) by mouth daily.  Marland Kitchen donepezil (ARICEPT) 5 MG tablet TAKE 1 TABLET BY MOUTH ONCE DAILY FOR MEMORY  . furosemide (LASIX) 20 MG tablet Take 1 tablet (20 mg total) by mouth daily.  Marland Kitchen glimepiride (AMARYL) 2 MG tablet Take 1 tab po daily with supper  . glucose blood (ACCU-CHEK GUIDE) test strip   . glucose blood test strip Use as instructed to check blood sugars twice a day.  E11.65  . Insulin Glargine (TOUJEO SOLOSTAR) 300 UNIT/ML SOPN Inject 20 mg into the skin daily.  . Insulin Pen Needle (BD PEN NEEDLE NANO U/F) 32G X 4 MM MISC   . lidocaine (LIDODERM) 5 % Place 1 patch onto the skin every 12 (twelve) hours. Remove & Discard patch within 12 hours or as directed by MD  . liraglutide (VICTOZA) 18 MG/3ML SOPN Inject 0.2 mLs (1.2 mg total) into the skin daily.  . pantoprazole (PROTONIX) 40 MG tablet Take 1 tablet (40 mg total) by mouth 2 (two) times daily.  . tizanidine (ZANAFLEX) 2 MG capsule   . TOUJEO SOLOSTAR 300 UNIT/ML SOPN INJECT  16  TO  18 UNITS SUBCUTANEOUSLY AT BEDTIME  ( EXPIRES 42 DAYS AFTER OPENING  )  . [DISCONTINUED] Alcohol Swabs (B-D SINGLE USE SWABS REGULAR) PADS  USE AS DIRECTED TWICE DAILY  . [DISCONTINUED] digoxin (LANOXIN) 0.125 MG tablet Take 125 mcg by mouth daily.  . [DISCONTINUED] rOPINIRole (REQUIP) 0.5 MG tablet Take 1 tablet (0.5 mg total) by mouth every evening.  . [DISCONTINUED] traMADol (ULTRAM) 50 MG tablet Take 1 tablet (50 mg total) by mouth 2 (two) times daily as needed. for pain  . metoCLOPramide (REGLAN) 5 MG tablet Take one tab po bid for nausea and one at bed time prn (Patient not taking: Reported on 10/01/2018)  . warfarin (COUMADIN) 4 MG tablet Take 1 tablet (4 mg total) by mouth See admin instructions. tk 1.5 tabs on Tuesday and Friday, and 1 tablet on the rest of the days   No facility-administered encounter medications on file as of 11/25/2018.     Surgical History: Past Surgical History:  Procedure Laterality Date  . ABDOMINAL HYSTERECTOMY    . APPENDECTOMY    . CATARACT EXTRACTION    . CHOLECYSTECTOMY    . HERNIA REPAIR    . right knee replacement    . right nephroectomy      Medical History: Past Medical History:  Diagnosis Date  . Atrial fibrillation ([place])   . Congestive heart failure (CHF) ([place])   . Diabetes mellitus without complication ([place])   . GERD (gastroesophageal reflux disease)   .  Hyperlipidemia   . Hypertension     Family History: Family History  Problem Relation Age of Onset  . Breast cancer Mother     Social History   Socioeconomic History  . Marital status: Widowed    Spouse name: Not on file  . Number of children: Not on file  . Years of education: Not on file  . Highest education level: Not on file  Occupational History  . Not on file  Social Needs  . Financial resource strain: Not on file  . Food insecurity:    Worry: Not on file    Inability: Not on file  . Transportation needs:    Medical: Not on file    Non-medical: Not on file  Tobacco Use  . Smoking status: Current Every Day Smoker    Packs/day: 1.00    Types: Cigarettes  . Smokeless tobacco: Never Used  Substance  and Sexual Activity  . Alcohol use: No  . Drug use: No  . Sexual activity: Not on file  Lifestyle  . Physical activity:    Days per week: Not on file    Minutes per session: Not on file  . Stress: Not on file  Relationships  . Social connections:    Talks on phone: Not on file    Gets together: Not on file    Attends religious service: Not on file    Active member of club or organization: Not on file    Attends meetings of clubs or organizations: Not on file    Relationship status: Not on file  . Intimate partner violence:    Fear of current or ex partner: Not on file    Emotionally abused: Not on file    Physically abused: Not on file    Forced sexual activity: Not on file  Other Topics Concern  . Not on file  Social History Narrative  . Not on file      Review of Systems  Constitutional: Negative for chills, fatigue and unexpected weight change.  HENT: Negative for congestion, rhinorrhea, sneezing and sore throat.   Eyes: Negative for photophobia, pain and redness.  Respiratory: Negative for cough, chest tightness and shortness of breath.   Cardiovascular: Negative for chest pain and palpitations.  Gastrointestinal: Negative for abdominal pain, constipation, diarrhea, nausea and vomiting.  Endocrine: Negative.   Genitourinary: Negative for dysuria and frequency.  Musculoskeletal: Negative for arthralgias, back pain, joint swelling and neck pain.  Skin: Negative for rash.  Allergic/Immunologic: Negative.   Neurological: Negative for tremors and numbness.  Hematological: Negative for adenopathy. Does not bruise/bleed easily.  Psychiatric/Behavioral: Negative for behavioral problems and sleep disturbance. The patient is not nervous/anxious.     Vital Signs: BP 130/70   Pulse 76   Resp 16   Ht 4\' 10"  (1.473 m)   Wt 121 lb (54.9 kg)   SpO2 96%   BMI 25.29 kg/m    Physical Exam Vitals signs and nursing note reviewed.  Constitutional:      General: She is not  in acute distress.    Appearance: She is well-developed. She is not diaphoretic.  HENT:     Head: Normocephalic and atraumatic.     Mouth/Throat:     Pharynx: No oropharyngeal exudate.  Eyes:     Pupils: Pupils are equal, round, and reactive to light.  Neck:     Musculoskeletal: Normal range of motion and neck supple.     Thyroid: No thyromegaly.     Vascular:  No JVD.     Trachea: No tracheal deviation.  Cardiovascular:     Rate and Rhythm: Normal rate and regular rhythm.     Heart sounds: Normal heart sounds. No murmur. No friction rub. No gallop.   Pulmonary:     Effort: Pulmonary effort is normal. No respiratory distress.     Breath sounds: Normal breath sounds. No wheezing or rales.  Chest:     Chest wall: No tenderness.  Abdominal:     Palpations: Abdomen is soft.     Tenderness: There is no abdominal tenderness. There is no guarding.  Musculoskeletal: Normal range of motion.  Lymphadenopathy:     Cervical: No cervical adenopathy.  Skin:    General: Skin is warm and dry.  Neurological:     Mental Status: She is alert and oriented to person, place, and time.     Cranial Nerves: No cranial nerve deficit.  Psychiatric:        Behavior: Behavior normal.        Thought Content: Thought content normal.        Judgment: Judgment normal.   Assessment/Plan:  1. Type 2 diabetes mellitus with stage 4 chronic kidney disease, without long-term current use of insulin (HCC) Stable, continue current diet as well as current medications.  2. Chronic anticoagulation Current INR 2.0.  Patient continue Coumadin at the following dosing. MWF 1.5 tabs 1 tab tue, thur, sat, sun.  3. Essential hypertension Stable, continue current medications as prescribed   General Counseling: Amy verbalizes understanding of the findings of todays visit and agrees with plan of treatment. I have discussed any further diagnostic evaluation that may be needed or ordered today. We also reviewed her  medications today. she has been encouraged to call the office with any questions or concerns that should arise related to todays visit.    No orders of the defined types were placed in this encounter.   No orders of the defined types were placed in this encounter.   Time spent: 25 Minutes   This patient was seen by Orson Gear AGNP-C in Collaboration with Dr Lavera Guise as a part of collaborative care agreement     Kendell Bane AGNP-C Internal medicine

## 2018-11-25 NOTE — Patient Instructions (Signed)
Diabetes Mellitus and Nutrition, Adult  When you have diabetes (diabetes mellitus), it is very important to have healthy eating habits because your blood sugar (glucose) levels are greatly affected by what you eat and drink. Eating healthy foods in the appropriate amounts, at about the same times every day, can help you:  · Control your blood glucose.  · Lower your risk of heart disease.  · Improve your blood pressure.  · Reach or maintain a healthy weight.  Every person with diabetes is different, and each person has different needs for a meal plan. Your health care provider may recommend that you work with a diet and nutrition specialist (dietitian) to make a meal plan that is best for you. Your meal plan may vary depending on factors such as:  · The calories you need.  · The medicines you take.  · Your weight.  · Your blood glucose, blood pressure, and cholesterol levels.  · Your activity level.  · Other health conditions you have, such as heart or kidney disease.  How do carbohydrates affect me?  Carbohydrates, also called carbs, affect your blood glucose level more than any other type of food. Eating carbs naturally raises the amount of glucose in your blood. Carb counting is a method for keeping track of how many carbs you eat. Counting carbs is important to keep your blood glucose at a healthy level, especially if you use insulin or take certain oral diabetes medicines.  It is important to know how many carbs you can safely have in each meal. This is different for every person. Your dietitian can help you calculate how many carbs you should have at each meal and for each snack.  Foods that contain carbs include:  · Bread, cereal, rice, pasta, and crackers.  · Potatoes and corn.  · Peas, beans, and lentils.  · Milk and yogurt.  · Fruit and juice.  · Desserts, such as cakes, cookies, ice cream, and candy.  How does alcohol affect me?  Alcohol can cause a sudden decrease in blood glucose (hypoglycemia),  especially if you use insulin or take certain oral diabetes medicines. Hypoglycemia can be a life-threatening condition. Symptoms of hypoglycemia (sleepiness, dizziness, and confusion) are similar to symptoms of having too much alcohol.  If your health care provider says that alcohol is safe for you, follow these guidelines:  · Limit alcohol intake to no more than 1 drink per day for nonpregnant women and 2 drinks per day for men. One drink equals 12 oz of beer, 5 oz of wine, or 1½ oz of hard liquor.  · Do not drink on an empty stomach.  · Keep yourself hydrated with water, diet soda, or unsweetened iced tea.  · Keep in mind that regular soda, juice, and other mixers may contain a lot of sugar and must be counted as carbs.  What are tips for following this plan?    Reading food labels  · Start by checking the serving size on the "Nutrition Facts" label of packaged foods and drinks. The amount of calories, carbs, fats, and other nutrients listed on the label is based on one serving of the item. Many items contain more than one serving per package.  · Check the total grams (g) of carbs in one serving. You can calculate the number of servings of carbs in one serving by dividing the total carbs by 15. For example, if a food has 30 g of total carbs, it would be equal to 2   servings of carbs.  · Check the number of grams (g) of saturated and trans fats in one serving. Choose foods that have low or no amount of these fats.  · Check the number of milligrams (mg) of salt (sodium) in one serving. Most people should limit total sodium intake to less than 2,300 mg per day.  · Always check the nutrition information of foods labeled as "low-fat" or "nonfat". These foods may be higher in added sugar or refined carbs and should be avoided.  · Talk to your dietitian to identify your daily goals for nutrients listed on the label.  Shopping  · Avoid buying canned, premade, or processed foods. These foods tend to be high in fat, sodium,  and added sugar.  · Shop around the outside edge of the grocery store. This includes fresh fruits and vegetables, bulk grains, fresh meats, and fresh dairy.  Cooking  · Use low-heat cooking methods, such as baking, instead of high-heat cooking methods like deep frying.  · Cook using healthy oils, such as olive, canola, or sunflower oil.  · Avoid cooking with butter, cream, or high-fat meats.  Meal planning  · Eat meals and snacks regularly, preferably at the same times every day. Avoid going long periods of time without eating.  · Eat foods high in fiber, such as fresh fruits, vegetables, beans, and whole grains. Talk to your dietitian about how many servings of carbs you can eat at each meal.  · Eat 4-6 ounces (oz) of lean protein each day, such as lean meat, chicken, fish, eggs, or tofu. One oz of lean protein is equal to:  ? 1 oz of meat, chicken, or fish.  ? 1 egg.  ? ¼ cup of tofu.  · Eat some foods each day that contain healthy fats, such as avocado, nuts, seeds, and fish.  Lifestyle  · Check your blood glucose regularly.  · Exercise regularly as told by your health care provider. This may include:  ? 150 minutes of moderate-intensity or vigorous-intensity exercise each week. This could be brisk walking, biking, or water aerobics.  ? Stretching and doing strength exercises, such as yoga or weightlifting, at least 2 times a week.  · Take medicines as told by your health care provider.  · Do not use any products that contain nicotine or tobacco, such as cigarettes and e-cigarettes. If you need help quitting, ask your health care provider.  · Work with a counselor or diabetes educator to identify strategies to manage stress and any emotional and social challenges.  Questions to ask a health care provider  · Do I need to meet with a diabetes educator?  · Do I need to meet with a dietitian?  · What number can I call if I have questions?  · When are the best times to check my blood glucose?  Where to find more  information:  · American Diabetes Association: diabetes.org  · Academy of Nutrition and Dietetics: www.eatright.org  · National Institute of Diabetes and Digestive and Kidney Diseases (NIH): www.niddk.nih.gov  Summary  · A healthy meal plan will help you control your blood glucose and maintain a healthy lifestyle.  · Working with a diet and nutrition specialist (dietitian) can help you make a meal plan that is best for you.  · Keep in mind that carbohydrates (carbs) and alcohol have immediate effects on your blood glucose levels. It is important to count carbs and to use alcohol carefully.  This information is not intended to   replace advice given to you by your health care provider. Make sure you discuss any questions you have with your health care provider.  Document Released: 07/06/2005 Document Revised: 05/09/2017 Document Reviewed: 11/13/2016  Elsevier Interactive Patient Education © 2019 Elsevier Inc.

## 2018-11-25 NOTE — Telephone Encounter (Signed)
As per pt daughter warfarin 4 mg take 1 tab po daily Except tue and Friday 1 and 1/2 tab po daily

## 2018-11-26 ENCOUNTER — Telehealth: Payer: Self-pay

## 2018-11-26 NOTE — Telephone Encounter (Signed)
[place] with [facility] called and reported that the patient INR 11/25/2018 was 1.4.

## 2018-12-02 ENCOUNTER — Encounter: Payer: Self-pay | Admitting: Adult Health

## 2018-12-03 ENCOUNTER — Ambulatory Visit: Payer: Self-pay | Admitting: Adult Health

## 2018-12-06 ENCOUNTER — Telehealth: Payer: Self-pay

## 2018-12-06 ENCOUNTER — Ambulatory Visit (INDEPENDENT_AMBULATORY_CARE_PROVIDER_SITE_OTHER): Payer: Medicare HMO

## 2018-12-06 DIAGNOSIS — Z7901 Long term (current) use of anticoagulants: Secondary | ICD-10-CM | POA: Diagnosis not present

## 2018-12-06 DIAGNOSIS — I480 Paroxysmal atrial fibrillation: Secondary | ICD-10-CM | POA: Diagnosis not present

## 2018-12-06 NOTE — Telephone Encounter (Signed)
lmom to call us back

## 2018-12-07 ENCOUNTER — Other Ambulatory Visit: Payer: Self-pay | Admitting: Adult Health

## 2018-12-09 ENCOUNTER — Other Ambulatory Visit: Payer: Self-pay | Admitting: Adult Health

## 2018-12-09 ENCOUNTER — Other Ambulatory Visit: Payer: Self-pay | Admitting: Internal Medicine

## 2018-12-09 NOTE — Progress Notes (Signed)
Pt INR 1.6 as per [name] pt daughter advised take coumadin  4 mg  Take 1.5 tab on Monday,Wednesday ,Friday and Sunday  and take coumadin 4 mg  1 tab po Tuesday ,thursday and saturday

## 2018-12-11 ENCOUNTER — Ambulatory Visit (INDEPENDENT_AMBULATORY_CARE_PROVIDER_SITE_OTHER): Payer: Medicare HMO

## 2018-12-11 ENCOUNTER — Telehealth: Payer: Self-pay

## 2018-12-11 DIAGNOSIS — I480 Paroxysmal atrial fibrillation: Secondary | ICD-10-CM | POA: Diagnosis not present

## 2018-12-11 NOTE — Telephone Encounter (Signed)
[name] TO CALL us BACK

## 2018-12-11 NOTE — Telephone Encounter (Signed)
Left a message on voicemail for pt daughter to call back in reference to her pain, and INR

## 2018-12-11 NOTE — Progress Notes (Signed)
Pt inr was 1.9 , pt has been taken 1.5 tabs of 4 mg, MWF and Sunday, and 1 tab every Tues, Thurs and Sat ,  Per Adam he wants her to take 1.5 tabs Everyday.  Daughter advised on 1.5 tabs daily

## 2018-12-12 ENCOUNTER — Telehealth: Payer: Self-pay

## 2018-12-12 ENCOUNTER — Other Ambulatory Visit: Payer: Self-pay | Admitting: Adult Health

## 2018-12-12 DIAGNOSIS — M25512 Pain in left shoulder: Secondary | ICD-10-CM

## 2018-12-12 MED ORDER — TRAMADOL HCL 50 MG PO TABS: 50.0000 mg | ORAL_TABLET | Freq: Two times a day (BID) | ORAL | 0 refills | Status: DC | PRN

## 2018-12-12 NOTE — Progress Notes (Signed)
Sent refill for patients Tramadol to [facility], [address].

## 2018-12-12 NOTE — Telephone Encounter (Signed)
Done

## 2018-12-17 ENCOUNTER — Ambulatory Visit (INDEPENDENT_AMBULATORY_CARE_PROVIDER_SITE_OTHER): Payer: Medicare HMO

## 2018-12-17 DIAGNOSIS — I480 Paroxysmal atrial fibrillation: Secondary | ICD-10-CM | POA: Diagnosis not present

## 2018-12-17 NOTE — Progress Notes (Addendum)
Pt Inr 2.8 as per adam pt daughter advised continue coumadin 4 mg take 1.5tab daily and recheck next week

## 2018-12-19 ENCOUNTER — Encounter: Payer: Self-pay | Admitting: Adult Health

## 2018-12-19 ENCOUNTER — Ambulatory Visit (INDEPENDENT_AMBULATORY_CARE_PROVIDER_SITE_OTHER): Payer: Medicare HMO | Admitting: Adult Health

## 2018-12-19 VITALS — BP 126/60 | HR 70 | Temp 98.2°F | Resp 16 | Ht <= 58 in | Wt 119.0 lb

## 2018-12-19 DIAGNOSIS — R5383 Other fatigue: Secondary | ICD-10-CM

## 2018-12-19 DIAGNOSIS — E162 Hypoglycemia, unspecified: Secondary | ICD-10-CM | POA: Diagnosis not present

## 2018-12-19 DIAGNOSIS — R7309 Other abnormal glucose: Secondary | ICD-10-CM

## 2018-12-19 LAB — GLUCOSE, POCT (MANUAL RESULT ENTRY): POC Glucose: 87 mg/dl (ref 70–99)

## 2018-12-19 NOTE — Progress Notes (Signed)
[facility] [place], [place] [address]  Internal MEDICINE  Office Visit Note  Patient Name: [name]  [id number]  [id number]  Date of Service: 12/19/2018  Chief Complaint  Patient presents with  . Shaking    noticed since increase in coumadin , sugar has been 85 in morning      HPI Pt is here for a sick visit. Pt reports she has been noticing some increased fatigue and low blood sugars over the past few days.  Pts daughter is in exam room.  After an extensive conversation about how the patient takes her insulin, her daughter reports she can not see the small numbers on the pen.  She holds the pen up to her ear and "clicks" it a certain number of times to get to her dose.  This has worked for [employer], however the patients hearing has decreased a bit, and now the daughter is afraid she may be overdosing her insulin, because she can not hear the clicks.   Current Medication:  Outpatient Encounter Medications as of 12/19/2018  Medication Sig  . ACCU-CHEK FASTCLIX LANCETS MISC   . Alcohol Swabs (B-D SINGLE USE SWABS REGULAR) PADS USE AS DIRECTED TWICE DAILY  . atorvastatin (LIPITOR) 10 MG tablet Take 1 tablet (10 mg total) by mouth every evening.  . citalopram (CELEXA) 40 MG tablet Take 1 tablet (40 mg total) by mouth daily.  . digoxin (LANOXIN) 0.125 MG tablet TAKE 1 TABLET BY MOUTH ONCE DAILY AFTER FINISHING LOADING DOSE.  Marland Kitchen donepezil (ARICEPT) 5 MG tablet TAKE 1 TABLET BY MOUTH ONCE DAILY FOR MEMORY  . furosemide (LASIX) 20 MG tablet Take 1 tablet (20 mg total) by mouth daily.  Marland Kitchen glimepiride (AMARYL) 2 MG tablet Take 1 tab po daily with supper  . glucose blood (ACCU-CHEK GUIDE) test strip   . glucose blood test strip Use as instructed to check blood sugars twice a day.  E11.65  . Insulin Glargine (TOUJEO SOLOSTAR) 300 UNIT/ML SOPN Inject 20 mg into the skin daily.  . Insulin Pen Needle (BD PEN NEEDLE NANO U/F) 32G X 4 MM MISC   . lidocaine (LIDODERM) 5  % Place 1 patch onto the skin every 12 (twelve) hours. Remove & Discard patch within 12 hours or as directed by MD  . liraglutide (VICTOZA) 18 MG/3ML SOPN Inject 0.2 mLs (1.2 mg total) into the skin daily.  . metoCLOPramide (REGLAN) 5 MG tablet Take one tab po bid for nausea and one at bed time prn  . pantoprazole (PROTONIX) 40 MG tablet Take 1 tablet (40 mg total) by mouth 2 (two) times daily.  Marland Kitchen rOPINIRole (REQUIP) 0.5 MG tablet TAKE 1 TABLET BY MOUTH IN THE EVENING  . tizanidine (ZANAFLEX) 2 MG capsule   . TOUJEO SOLOSTAR 300 UNIT/ML SOPN INJECT  16  TO  18 UNITS SUBCUTANEOUSLY AT BEDTIME  ( EXPIRES 42 DAYS AFTER OPENING  )  . traMADol (ULTRAM) 50 MG tablet Take 1 tablet (50 mg total) by mouth 2 (two) times daily as needed. for pain  . warfarin (COUMADIN) 4 MG tablet Take 1 tablet (4 mg total) by mouth See admin instructions. tk 1.5 tabs on Tuesday and Friday, and 1 tablet on the rest of the days   No facility-administered encounter medications on file as of 12/19/2018.       Medical History: Past Medical History:  Diagnosis Date  . Atrial fibrillation ([place])   . Congestive heart failure (CHF) ([place])   .  Diabetes mellitus without complication ([place])   . GERD (gastroesophageal reflux disease)   . Hyperlipidemia   . Hypertension      Vital Signs: BP 126/60   Pulse 70   Temp 98.2 F (36.8 C) (Oral)   Resp 16   Ht 4\' 9"  (1.448 m)   Wt 119 lb (54 kg)   SpO2 97%   BMI 25.75 kg/m    Review of Systems  Constitutional: Negative for chills, fatigue and unexpected weight change.  HENT: Negative for congestion, rhinorrhea, sneezing and sore throat.   Eyes: Negative for photophobia, pain and redness.  Respiratory: Negative for cough, chest tightness and shortness of breath.   Cardiovascular: Negative for chest pain and palpitations.  Gastrointestinal: Negative for abdominal pain, constipation, diarrhea, nausea and vomiting.  Endocrine: Negative.   Genitourinary: Negative for dysuria  and frequency.  Musculoskeletal: Negative for arthralgias, back pain, joint swelling and neck pain.  Skin: Negative for rash.  Allergic/Immunologic: Negative.   Neurological: Negative for tremors and numbness.  Hematological: Negative for adenopathy. Does not bruise/bleed easily.  Psychiatric/Behavioral: Negative for behavioral problems and sleep disturbance. The patient is not nervous/anxious.     Physical Exam Vitals signs and nursing note reviewed.  Constitutional:      General: She is not in acute distress.    Appearance: She is well-developed. She is not diaphoretic.  HENT:     Head: Normocephalic and atraumatic.     Mouth/Throat:     Pharynx: No oropharyngeal exudate.  Eyes:     Pupils: Pupils are equal, round, and reactive to light.  Neck:     Musculoskeletal: Normal range of motion and neck supple.     Thyroid: No thyromegaly.     Vascular: No JVD.     Trachea: No tracheal deviation.  Cardiovascular:     Rate and Rhythm: Normal rate and regular rhythm.     Heart sounds: Normal heart sounds. No murmur. No friction rub. No gallop.   Pulmonary:     Effort: Pulmonary effort is normal. No respiratory distress.     Breath sounds: Normal breath sounds. No wheezing or rales.  Chest:     Chest wall: No tenderness.  Abdominal:     Palpations: Abdomen is soft.     Tenderness: There is no abdominal tenderness. There is no guarding.  Musculoskeletal: Normal range of motion.  Lymphadenopathy:     Cervical: No cervical adenopathy.  Skin:    General: Skin is warm and dry.  Neurological:     Mental Status: She is alert and oriented to person, place, and time.     Cranial Nerves: No cranial nerve deficit.  Psychiatric:        Behavior: Behavior normal.        Thought Content: Thought content normal.        Judgment: Judgment normal.     Assessment/Plan: 1. Low glucose level Daughter will have someone available to help patient does her insulin through the pen so that she is  getting the correct dose.  We will leave Victoza at 1.8 mg at this time.  We will decrease the Toujeo by 4 units and monitor the patient's blood sugar.  Instructed the daughter that she can look at her blood sugar and go back up to her current if pressure improves with someone else giving her insulin. - POCT glucose (manual entry)  2. Fatigue, unspecified type Fatigue lab ordered for patient at this visit.  I believe her fatigue could be  related to the fact that her blood sugar is lower than she is used to which is possibly from her getting too much insulin due to the way she has to dose it.  With some help dosing her medication hoping that her blood sugar will normalize if that is the case worse when do blood to verify vitamin B12 and folate iron and vitamin D. - CBC with Differential/Platelet - Comprehensive metabolic panel - Fe+TIBC+Fer - Vitamin D 1,25 dihydroxy - B12 and Folate Panel  General Counseling: Kylene verbalizes understanding of the findings of todays visit and agrees with plan of treatment. I have discussed any further diagnostic evaluation that may be needed or ordered today. We also reviewed her medications today. she has been encouraged to call the office with any questions or concerns that should arise related to todays visit.   Orders Placed This Encounter  Procedures  . POCT glucose (manual entry)    No orders of the defined types were placed in this encounter.   Time spent: 25 Minutes  This patient was seen by Orson Gear AGNP-C in Collaboration with Dr Lavera Guise as a part of collaborative care agreement.  Kendell Bane AGNP-C Internal Medicine

## 2018-12-19 NOTE — Patient Instructions (Signed)
Diabetes Mellitus and Nutrition, Adult  When you have diabetes (diabetes mellitus), it is very important to have healthy eating habits because your blood sugar (glucose) levels are greatly affected by what you eat and drink. Eating healthy foods in the appropriate amounts, at about the same times every day, can help you:  · Control your blood glucose.  · Lower your risk of heart disease.  · Improve your blood pressure.  · Reach or maintain a healthy weight.  Every person with diabetes is different, and each person has different needs for a meal plan. Your health care provider may recommend that you work with a diet and nutrition specialist (dietitian) to make a meal plan that is best for you. Your meal plan may vary depending on factors such as:  · The calories you need.  · The medicines you take.  · Your weight.  · Your blood glucose, blood pressure, and cholesterol levels.  · Your activity level.  · Other health conditions you have, such as heart or kidney disease.  How do carbohydrates affect me?  Carbohydrates, also called carbs, affect your blood glucose level more than any other type of food. Eating carbs naturally raises the amount of glucose in your blood. Carb counting is a method for keeping track of how many carbs you eat. Counting carbs is important to keep your blood glucose at a healthy level, especially if you use insulin or take certain oral diabetes medicines.  It is important to know how many carbs you can safely have in each meal. This is different for every person. Your dietitian can help you calculate how many carbs you should have at each meal and for each snack.  Foods that contain carbs include:  · Bread, cereal, rice, pasta, and crackers.  · Potatoes and corn.  · Peas, beans, and lentils.  · Milk and yogurt.  · Fruit and juice.  · Desserts, such as cakes, cookies, ice cream, and candy.  How does alcohol affect me?  Alcohol can cause a sudden decrease in blood glucose (hypoglycemia),  especially if you use insulin or take certain oral diabetes medicines. Hypoglycemia can be a life-threatening condition. Symptoms of hypoglycemia (sleepiness, dizziness, and confusion) are similar to symptoms of having too much alcohol.  If your health care provider says that alcohol is safe for you, follow these guidelines:  · Limit alcohol intake to no more than 1 drink per day for nonpregnant women and 2 drinks per day for men. One drink equals 12 oz of beer, 5 oz of wine, or 1½ oz of hard liquor.  · Do not drink on an empty stomach.  · Keep yourself hydrated with water, diet soda, or unsweetened iced tea.  · Keep in mind that regular soda, juice, and other mixers may contain a lot of sugar and must be counted as carbs.  What are tips for following this plan?    Reading food labels  · Start by checking the serving size on the "Nutrition Facts" label of packaged foods and drinks. The amount of calories, carbs, fats, and other nutrients listed on the label is based on one serving of the item. Many items contain more than one serving per package.  · Check the total grams (g) of carbs in one serving. You can calculate the number of servings of carbs in one serving by dividing the total carbs by 15. For example, if a food has 30 g of total carbs, it would be equal to 2   servings of carbs.  · Check the number of grams (g) of saturated and trans fats in one serving. Choose foods that have low or no amount of these fats.  · Check the number of milligrams (mg) of salt (sodium) in one serving. Most people should limit total sodium intake to less than 2,300 mg per day.  · Always check the nutrition information of foods labeled as "low-fat" or "nonfat". These foods may be higher in added sugar or refined carbs and should be avoided.  · Talk to your dietitian to identify your daily goals for nutrients listed on the label.  Shopping  · Avoid buying canned, premade, or processed foods. These foods tend to be high in fat, sodium,  and added sugar.  · Shop around the outside edge of the grocery store. This includes fresh fruits and vegetables, bulk grains, fresh meats, and fresh dairy.  Cooking  · Use low-heat cooking methods, such as baking, instead of high-heat cooking methods like deep frying.  · Cook using healthy oils, such as olive, canola, or sunflower oil.  · Avoid cooking with butter, cream, or high-fat meats.  Meal planning  · Eat meals and snacks regularly, preferably at the same times every day. Avoid going long periods of time without eating.  · Eat foods high in fiber, such as fresh fruits, vegetables, beans, and whole grains. Talk to your dietitian about how many servings of carbs you can eat at each meal.  · Eat 4-6 ounces (oz) of lean protein each day, such as lean meat, chicken, fish, eggs, or tofu. One oz of lean protein is equal to:  ? 1 oz of meat, chicken, or fish.  ? 1 egg.  ? ¼ cup of tofu.  · Eat some foods each day that contain healthy fats, such as avocado, nuts, seeds, and fish.  Lifestyle  · Check your blood glucose regularly.  · Exercise regularly as told by your health care provider. This may include:  ? 150 minutes of moderate-intensity or vigorous-intensity exercise each week. This could be brisk walking, biking, or water aerobics.  ? Stretching and doing strength exercises, such as yoga or weightlifting, at least 2 times a week.  · Take medicines as told by your health care provider.  · Do not use any products that contain nicotine or tobacco, such as cigarettes and e-cigarettes. If you need help quitting, ask your health care provider.  · Work with a counselor or diabetes educator to identify strategies to manage stress and any emotional and social challenges.  Questions to ask a health care provider  · Do I need to meet with a diabetes educator?  · Do I need to meet with a dietitian?  · What number can I call if I have questions?  · When are the best times to check my blood glucose?  Where to find more  information:  · American Diabetes Association: diabetes.org  · Academy of Nutrition and Dietetics: www.eatright.org  · National Institute of Diabetes and Digestive and Kidney Diseases (NIH): www.niddk.nih.gov  Summary  · A healthy meal plan will help you control your blood glucose and maintain a healthy lifestyle.  · Working with a diet and nutrition specialist (dietitian) can help you make a meal plan that is best for you.  · Keep in mind that carbohydrates (carbs) and alcohol have immediate effects on your blood glucose levels. It is important to count carbs and to use alcohol carefully.  This information is not intended to   replace advice given to you by your health care provider. Make sure you discuss any questions you have with your health care provider.  Document Released: 07/06/2005 Document Revised: 05/09/2017 Document Reviewed: 11/13/2016  Elsevier Interactive Patient Education © 2019 Elsevier Inc.

## 2018-12-21 NOTE — Addendum Note (Signed)
Addended by: Devona Konig on: 12/21/2018 07:06 PM   Modules accepted: Level of Service

## 2018-12-23 ENCOUNTER — Other Ambulatory Visit: Payer: Self-pay | Admitting: Adult Health

## 2018-12-24 ENCOUNTER — Ambulatory Visit (INDEPENDENT_AMBULATORY_CARE_PROVIDER_SITE_OTHER): Payer: Medicare HMO

## 2018-12-24 ENCOUNTER — Encounter: Payer: Self-pay | Admitting: Adult Health

## 2018-12-24 DIAGNOSIS — I480 Paroxysmal atrial fibrillation: Secondary | ICD-10-CM | POA: Diagnosis not present

## 2018-12-24 DIAGNOSIS — R6889 Other general symptoms and signs: Secondary | ICD-10-CM | POA: Diagnosis not present

## 2018-12-24 DIAGNOSIS — E509 Vitamin A deficiency, unspecified: Secondary | ICD-10-CM | POA: Diagnosis not present

## 2018-12-24 DIAGNOSIS — E559 Vitamin D deficiency, unspecified: Secondary | ICD-10-CM | POA: Diagnosis not present

## 2018-12-24 DIAGNOSIS — D519 Vitamin B12 deficiency anemia, unspecified: Secondary | ICD-10-CM | POA: Diagnosis not present

## 2018-12-24 DIAGNOSIS — Z13228 Encounter for screening for other metabolic disorders: Secondary | ICD-10-CM | POA: Diagnosis not present

## 2018-12-24 DIAGNOSIS — R5383 Other fatigue: Secondary | ICD-10-CM | POA: Diagnosis not present

## 2018-12-24 LAB — PROTIME-INR
INR: 1.9 — AB (ref 0.9–1.1)
INR: 2.8 — AB (ref 0.9–1.1)

## 2018-12-24 NOTE — Progress Notes (Signed)
Pt Inr was 2.9 as per adam continue same

## 2018-12-30 ENCOUNTER — Telehealth: Payer: Self-pay | Admitting: Adult Health

## 2018-12-30 ENCOUNTER — Other Ambulatory Visit: Payer: Self-pay | Admitting: Adult Health

## 2018-12-30 LAB — COMPREHENSIVE METABOLIC PANEL
ALT: 11 IU/L (ref 0–32)
AST: 16 IU/L (ref 0–40)
Albumin/Globulin Ratio: 1.2 (ref 1.2–2.2)
Albumin: 3.4 g/dL — ABNORMAL LOW (ref 3.7–4.7)
Alkaline Phosphatase: 74 IU/L (ref 39–117)
BUN/Creatinine Ratio: 12 (ref 12–28)
BUN: 17 mg/dL (ref 8–27)
Bilirubin Total: 0.3 mg/dL (ref 0.0–1.2)
CO2: 24 mmol/L (ref 20–29)
Calcium: 8.9 mg/dL (ref 8.7–10.3)
Chloride: 104 mmol/L (ref 96–106)
Creatinine, Ser: 1.47 mg/dL — ABNORMAL HIGH (ref 0.57–1.00)
GFR calc non Af Amer: 34 mL/min/{1.73_m2} — ABNORMAL LOW (ref >59)
GFR, EST AFRICAN AMERICAN: 39 mL/min/{1.73_m2} — AB (ref >59)
Globulin, Total: 2.8 g/dL (ref 1.5–4.5)
Glucose: 93 mg/dL (ref 65–99)
Potassium: 4.6 mmol/L (ref 3.5–5.2)
Sodium: 140 mmol/L (ref 134–144)
Total Protein: 6.2 g/dL (ref 6.0–8.5)

## 2018-12-30 LAB — B12 AND FOLATE PANEL
Folate: 10.2 ng/mL (ref >3.0)
Vitamin B-12: 267 pg/mL (ref 232–1245)

## 2018-12-30 LAB — IRON,TIBC AND FERRITIN PANEL
Ferritin: 72 ng/mL (ref 15–150)
IRON SATURATION: 18 % (ref 15–55)
Iron: 42 ug/dL (ref 27–139)
Total Iron Binding Capacity: 230 ug/dL — ABNORMAL LOW (ref 250–450)
UIBC: 188 ug/dL (ref 118–369)

## 2018-12-30 LAB — CBC WITH DIFFERENTIAL/PLATELET
Basophils Absolute: 0.1 10*3/uL (ref 0.0–0.2)
Basos: 1 %
EOS (ABSOLUTE): 0.2 10*3/uL (ref 0.0–0.4)
Eos: 2 %
Hematocrit: 36.8 % (ref 34.0–46.6)
Hemoglobin: 12.3 g/dL (ref 11.1–15.9)
Immature Grans (Abs): 0.1 10*3/uL (ref 0.0–0.1)
Immature Granulocytes: 1 %
Lymphocytes Absolute: 2.9 10*3/uL (ref 0.7–3.1)
Lymphs: 32 %
MCH: 31.8 pg (ref 26.6–33.0)
MCHC: 33.4 g/dL (ref 31.5–35.7)
MCV: 95 fL (ref 79–97)
Monocytes Absolute: 0.6 10*3/uL (ref 0.1–0.9)
Monocytes: 6 %
NEUTROS PCT: 58 %
Neutrophils Absolute: 5.4 10*3/uL (ref 1.4–7.0)
PLATELETS: 236 10*3/uL (ref 150–450)
RBC: 3.87 x10E6/uL (ref 3.77–5.28)
RDW: 13.7 % (ref 11.7–15.4)
WBC: 9.1 10*3/uL (ref 3.4–10.8)

## 2018-12-30 LAB — VITAMIN D 1,25 DIHYDROXY
Vitamin D 1, 25 (OH)2 Total: 18 pg/mL — ABNORMAL LOW
Vitamin D3 1, 25 (OH)2: 13 pg/mL

## 2018-12-30 MED ORDER — VITAMIN D (ERGOCALCIFEROL) 1.25 MG (50000 UNIT) PO CAPS: 50000.0000 [IU] | ORAL_CAPSULE | ORAL | 1 refills | Status: DC

## 2018-12-30 NOTE — Progress Notes (Signed)
PTs Vit D level is 18, Sent RX for Drisdol to [facility].

## 2018-12-30 NOTE — Telephone Encounter (Signed)
Left message for patient to return call in regards to her labs

## 2018-12-30 NOTE — Telephone Encounter (Signed)
-----   Message from Kendell Bane, NP sent at 12/30/2018  8:31 AM EDT ----- Her Vit D level is low.  I sent weekly Vit D pills to [place] in her chart. I want her to take it for 8 weeks and we will recheck it at her next visit.

## 2018-12-31 ENCOUNTER — Ambulatory Visit (INDEPENDENT_AMBULATORY_CARE_PROVIDER_SITE_OTHER): Payer: Medicare HMO

## 2018-12-31 ENCOUNTER — Telehealth: Payer: Self-pay

## 2018-12-31 DIAGNOSIS — I480 Paroxysmal atrial fibrillation: Secondary | ICD-10-CM

## 2018-12-31 DIAGNOSIS — Z7901 Long term (current) use of anticoagulants: Secondary | ICD-10-CM | POA: Diagnosis not present

## 2018-12-31 NOTE — Progress Notes (Signed)
Pt Inr 1.7 as per adam advised to take 1 extra med and continue coumadin 4 mg 1.5 tab daily

## 2018-12-31 NOTE — Telephone Encounter (Signed)
lmom to call us back

## 2018-12-31 NOTE — Telephone Encounter (Signed)
Per Adam ice shoulder , and use topical , continue with current medications

## 2019-01-03 ENCOUNTER — Emergency Department: Payer: Medicare HMO

## 2019-01-03 ENCOUNTER — Inpatient Hospital Stay
Admission: EM | Admit: 2019-01-03 | Discharge: 2019-01-05 | DRG: 871 | Disposition: A | Discharge status: Home / Self Care | Payer: Medicare HMO | Attending: Internal Medicine | Admitting: Internal Medicine

## 2019-01-03 ENCOUNTER — Ambulatory Visit: Payer: Self-pay | Admitting: Adult Health

## 2019-01-03 ENCOUNTER — Other Ambulatory Visit: Payer: Self-pay

## 2019-01-03 DIAGNOSIS — F17210 Nicotine dependence, cigarettes, uncomplicated: Secondary | ICD-10-CM | POA: Diagnosis present

## 2019-01-03 DIAGNOSIS — Z905 Acquired absence of kidney: Secondary | ICD-10-CM

## 2019-01-03 DIAGNOSIS — Z79899 Other long term (current) drug therapy: Secondary | ICD-10-CM | POA: Diagnosis not present

## 2019-01-03 DIAGNOSIS — T45515A Adverse effect of anticoagulants, initial encounter: Secondary | ICD-10-CM | POA: Diagnosis present

## 2019-01-03 DIAGNOSIS — M150 Primary generalized (osteo)arthritis: Secondary | ICD-10-CM | POA: Diagnosis present

## 2019-01-03 DIAGNOSIS — E1122 Type 2 diabetes mellitus with diabetic chronic kidney disease: Secondary | ICD-10-CM | POA: Diagnosis not present

## 2019-01-03 DIAGNOSIS — Z716 Tobacco abuse counseling: Secondary | ICD-10-CM

## 2019-01-03 DIAGNOSIS — A419 Sepsis, unspecified organism: Secondary | ICD-10-CM | POA: Diagnosis not present

## 2019-01-03 DIAGNOSIS — E785 Hyperlipidemia, unspecified: Secondary | ICD-10-CM | POA: Diagnosis present

## 2019-01-03 DIAGNOSIS — Z794 Long term (current) use of insulin: Secondary | ICD-10-CM | POA: Diagnosis not present

## 2019-01-03 DIAGNOSIS — Z7901 Long term (current) use of anticoagulants: Secondary | ICD-10-CM

## 2019-01-03 DIAGNOSIS — I130 Hypertensive heart and chronic kidney disease with heart failure and stage 1 through stage 4 chronic kidney disease, or unspecified chronic kidney disease: Secondary | ICD-10-CM | POA: Diagnosis not present

## 2019-01-03 DIAGNOSIS — A4189 Other specified sepsis: Principal | ICD-10-CM | POA: Diagnosis present

## 2019-01-03 DIAGNOSIS — D6832 Hemorrhagic disorder due to extrinsic circulating anticoagulants: Secondary | ICD-10-CM | POA: Diagnosis present

## 2019-01-03 DIAGNOSIS — R319 Hematuria, unspecified: Secondary | ICD-10-CM | POA: Diagnosis present

## 2019-01-03 DIAGNOSIS — J189 Pneumonia, unspecified organism: Secondary | ICD-10-CM | POA: Diagnosis not present

## 2019-01-03 DIAGNOSIS — E43 Unspecified severe protein-calorie malnutrition: Secondary | ICD-10-CM | POA: Diagnosis not present

## 2019-01-03 DIAGNOSIS — I482 Chronic atrial fibrillation, unspecified: Secondary | ICD-10-CM | POA: Diagnosis not present

## 2019-01-03 DIAGNOSIS — K219 Gastro-esophageal reflux disease without esophagitis: Secondary | ICD-10-CM | POA: Diagnosis present

## 2019-01-03 DIAGNOSIS — I5032 Chronic diastolic (congestive) heart failure: Secondary | ICD-10-CM | POA: Diagnosis present

## 2019-01-03 DIAGNOSIS — R531 Weakness: Secondary | ICD-10-CM | POA: Diagnosis not present

## 2019-01-03 DIAGNOSIS — N183 Chronic kidney disease, stage 3 (moderate): Secondary | ICD-10-CM | POA: Diagnosis not present

## 2019-01-03 DIAGNOSIS — J101 Influenza due to other identified influenza virus with other respiratory manifestations: Secondary | ICD-10-CM | POA: Diagnosis present

## 2019-01-03 DIAGNOSIS — Z66 Do not resuscitate: Secondary | ICD-10-CM | POA: Diagnosis present

## 2019-01-03 DIAGNOSIS — Z6825 Body mass index (BMI) 25.0-25.9, adult: Secondary | ICD-10-CM

## 2019-01-03 DIAGNOSIS — J111 Influenza due to unidentified influenza virus with other respiratory manifestations: Secondary | ICD-10-CM

## 2019-01-03 DIAGNOSIS — R911 Solitary pulmonary nodule: Secondary | ICD-10-CM | POA: Diagnosis present

## 2019-01-03 DIAGNOSIS — R509 Fever, unspecified: Secondary | ICD-10-CM | POA: Diagnosis not present

## 2019-01-03 DIAGNOSIS — R05 Cough: Secondary | ICD-10-CM | POA: Diagnosis not present

## 2019-01-03 DIAGNOSIS — R0602 Shortness of breath: Secondary | ICD-10-CM | POA: Diagnosis not present

## 2019-01-03 LAB — URINALYSIS, COMPLETE (UACMP) WITH MICROSCOPIC
Bacteria, UA: NONE SEEN
Bilirubin Urine: NEGATIVE
Glucose, UA: NEGATIVE mg/dL
Ketones, ur: NEGATIVE mg/dL
Leukocytes,Ua: NEGATIVE
Nitrite: NEGATIVE
Protein, ur: 100 mg/dL — AB
RBC / HPF: >50 RBC/hpf — ABNORMAL HIGH (ref 0–5)
Specific Gravity, Urine: 1.01 (ref 1.005–1.030)
pH: 6 (ref 5.0–8.0)

## 2019-01-03 LAB — PROTIME-INR
INR: 1.5 — ABNORMAL HIGH (ref 0.8–1.2)
Prothrombin Time: 18.2 seconds — ABNORMAL HIGH (ref 11.4–15.2)

## 2019-01-03 LAB — GLUCOSE, CAPILLARY
Glucose-Capillary: 76 mg/dL (ref 70–99)
Glucose-Capillary: 127 mg/dL — ABNORMAL HIGH (ref 70–99)

## 2019-01-03 LAB — CBC
HCT: 36.8 % (ref 36.0–46.0)
Hemoglobin: 12.4 g/dL (ref 12.0–15.0)
MCH: 31.8 pg (ref 26.0–34.0)
MCHC: 33.7 g/dL (ref 30.0–36.0)
MCV: 94.4 fL (ref 80.0–100.0)
Platelets: 163 10*3/uL (ref 150–400)
RBC: 3.9 MIL/uL (ref 3.87–5.11)
RDW: 13.7 % (ref 11.5–15.5)
WBC: 7.1 10*3/uL (ref 4.0–10.5)
nRBC: 0 % (ref 0.0–0.2)

## 2019-01-03 LAB — INFLUENZA PANEL BY PCR (TYPE A & B)
INFLBPCR: NEGATIVE
Influenza A By PCR: POSITIVE — AB

## 2019-01-03 LAB — LACTIC ACID, PLASMA
Lactic Acid, Venous: 0.9 mmol/L (ref 0.5–1.9)
Lactic Acid, Venous: 0.6 mmol/L (ref 0.5–1.9)

## 2019-01-03 LAB — BASIC METABOLIC PANEL
Anion gap: 9 (ref 5–15)
BUN: 24 mg/dL — ABNORMAL HIGH (ref 8–23)
CHLORIDE: 96 mmol/L — AB (ref 98–111)
CO2: 26 mmol/L (ref 22–32)
Calcium: 8.6 mg/dL — ABNORMAL LOW (ref 8.9–10.3)
Creatinine, Ser: 1.42 mg/dL — ABNORMAL HIGH (ref 0.44–1.00)
GFR calc Af Amer: 41 mL/min — ABNORMAL LOW (ref >60)
GFR calc non Af Amer: 36 mL/min — ABNORMAL LOW (ref >60)
Glucose, Bld: 102 mg/dL — ABNORMAL HIGH (ref 70–99)
Potassium: 4.3 mmol/L (ref 3.5–5.1)
Sodium: 131 mmol/L — ABNORMAL LOW (ref 135–145)

## 2019-01-03 MED ORDER — OSELTAMIVIR PHOSPHATE 75 MG PO CAPS
75.0000 mg | ORAL_CAPSULE | Freq: Two times a day (BID) | ORAL | Status: DC
  Administered 2019-01-03: 21:00:00 75 mg via ORAL
  Filled 2019-01-03: qty 1

## 2019-01-03 MED ORDER — ONDANSETRON HCL 4 MG/2ML IJ SOLN: 4.0000 mg | Freq: Four times a day (QID) | INTRAMUSCULAR | Status: DC | PRN

## 2019-01-03 MED ORDER — INSULIN ASPART 100 UNIT/ML ~~LOC~~ SOLN: 0.0000 [IU] | Freq: Every day | SUBCUTANEOUS | Status: DC

## 2019-01-03 MED ORDER — DOCUSATE SODIUM 100 MG PO CAPS
100.0000 mg | ORAL_CAPSULE | Freq: Two times a day (BID) | ORAL | Status: DC
  Administered 2019-01-03 – 2019-01-04 (×2): 100 mg via ORAL
  Filled 2019-01-03 (×4): qty 1

## 2019-01-03 MED ORDER — ALBUTEROL SULFATE (2.5 MG/3ML) 0.083% IN NEBU: 2.5000 mg | INHALATION_SOLUTION | RESPIRATORY_TRACT | Status: DC | PRN

## 2019-01-03 MED ORDER — INSULIN ASPART 100 UNIT/ML ~~LOC~~ SOLN
0.0000 [IU] | Freq: Three times a day (TID) | SUBCUTANEOUS | Status: DC
  Administered 2019-01-05: 13:00:00 2 [IU] via SUBCUTANEOUS
  Filled 2019-01-03: qty 1

## 2019-01-03 MED ORDER — WARFARIN SODIUM 6 MG PO TABS
6.0000 mg | ORAL_TABLET | Freq: Once | ORAL | Status: AC
  Administered 2019-01-03: 6 mg via ORAL
  Filled 2019-01-03: qty 1

## 2019-01-03 MED ORDER — ONDANSETRON HCL 4 MG PO TABS: 4.0000 mg | ORAL_TABLET | Freq: Four times a day (QID) | ORAL | Status: DC | PRN

## 2019-01-03 MED ORDER — METOPROLOL TARTRATE 5 MG/5ML IV SOLN: 5.0000 mg | INTRAVENOUS | Status: DC | PRN

## 2019-01-03 MED ORDER — INSULIN GLARGINE 100 UNIT/ML ~~LOC~~ SOLN
10.0000 [IU] | Freq: Every day | SUBCUTANEOUS | Status: DC
  Administered 2019-01-03: 10 [IU] via SUBCUTANEOUS
  Filled 2019-01-03 (×2): qty 0.1

## 2019-01-03 MED ORDER — SODIUM CHLORIDE 0.9 % IV SOLN
INTRAVENOUS | Status: AC
  Administered 2019-01-03 – 2019-01-04 (×2): via INTRAVENOUS

## 2019-01-03 MED ORDER — ACETAMINOPHEN 325 MG PO TABS
ORAL_TABLET | ORAL | Status: AC
  Administered 2019-01-03: 650 mg via ORAL
  Filled 2019-01-03: qty 2

## 2019-01-03 MED ORDER — ACETAMINOPHEN 325 MG PO TABS
650.0000 mg | ORAL_TABLET | Freq: Four times a day (QID) | ORAL | Status: DC | PRN
  Administered 2019-01-03 – 2019-01-04 (×4): 650 mg via ORAL
  Filled 2019-01-03 (×4): qty 2

## 2019-01-03 MED ORDER — ACETAMINOPHEN 650 MG RE SUPP: 650.0000 mg | Freq: Four times a day (QID) | RECTAL | Status: DC | PRN

## 2019-01-03 MED ORDER — NICOTINE 21 MG/24HR TD PT24
21.0000 mg | MEDICATED_PATCH | Freq: Every day | TRANSDERMAL | Status: DC
  Administered 2019-01-03 – 2019-01-05 (×3): 21 mg via TRANSDERMAL
  Filled 2019-01-03 (×3): qty 1

## 2019-01-03 MED ORDER — SODIUM CHLORIDE 0.9 % IV SOLN
1000.0000 mL | Freq: Once | INTRAVENOUS | Status: AC
  Administered 2019-01-03: 1000 mL via INTRAVENOUS

## 2019-01-03 MED ORDER — ACETAMINOPHEN 325 MG PO TABS
650.0000 mg | ORAL_TABLET | Freq: Once | ORAL | Status: AC
  Administered 2019-01-03: 650 mg via ORAL

## 2019-01-03 NOTE — Progress Notes (Addendum)
ANTICOAGULATION CONSULT NOTE - Initial Consult  Pharmacy Consult for Coumadin dosing Indication: atrial fibrillation  No Known Allergies  Patient Measurements: Height: 4\' 9"  (144.8 cm) Weight: 118 lb 13.3 oz (53.9 kg) IBW/kg (Calculated) : 38.6  Vital Signs: Temp: 101 F (38.3 C) (03/13 1232) Temp Source: Oral (03/13 1232) BP: 133/52 (03/13 1600) Pulse Rate: 40 (03/13 1600)  Labs: INR- 1.5 Recent Labs    01/03/19 1242  HGB 12.4  HCT 36.8  PLT 163  CREATININE 1.42*    Estimated Creatinine Clearance: 23.4 mL/min (A) (by C-G formula based on SCr of 1.42 mg/dL (H)).   Medical History: Past Medical History:  Diagnosis Date  . Atrial fibrillation ([place])   . Congestive heart failure (CHF) ([place])   . Diabetes mellitus without complication ([place])   . GERD (gastroesophageal reflux disease)   . Hyperlipidemia   . Hypertension     Medications:  Scheduled:  . insulin aspart  0-5 Units Subcutaneous QHS  . insulin aspart  0-9 Units Subcutaneous TID WC  . insulin glargine  10 Units Subcutaneous QHS  . oseltamivir  75 mg Oral BID    Assessment: 78 year old female with CC of weakness and fever. Influenza A positive. History of Atrial fibrillation treated with Coumadin at home. Home dose of Coumadin per home med list is 4mg  Monday, Wednesday, Thursday, Saturday and Sunday. 6mg  on Tuesday and Friday. Patient now with hematuria possibly from Coumadin.   Goal of Therapy:  INR 2-3  Plan: Coumadin 6mg  x1 today after INR result todoay of 1.5 and follow up with INR 3/14.   Cloma Rahrig 01/03/2019,4:26 PM

## 2019-01-03 NOTE — ED Notes (Addendum)
ED TO INPATIENT HANDOFF REPORT  ED Nurse Name and Phone #: Karena Addison 51  S Name/Age/Gender [name] 78 y.o. female Room/Bed: ED03A/ED03A  Code Status   Code Status: DNR  Home/SNF/Other Home Patient oriented to: self, place and situation Is this baseline? Yes   Triage Complete: Triage complete  Chief Complaint Generalized Weakness   Triage Note Pt arrives to ED c/o weakness and shaky legs. Pt states some cough.   A&O, in wheelchair.    Allergies No Known Allergies  Level of Care/Admitting Diagnosis ED Disposition    ED Disposition Condition [facility] Area: [place] [[id number]]  Level of Care: Med-Surg [16]  Diagnosis: Sepsis [facility]) [[id number]]  Admitting Physician: Nicholes Mango [[id number]]  Attending Physician: Nicholes Mango [[id number]]  Estimated length of stay: past midnight tomorrow  Certification:: I certify this patient will need inpatient services for at least 2 midnights  Bed request comments: 1c  PT Class (Do Not Modify): Inpatient [101]  PT Acc Code (Do Not Modify): Private [1]       B Medical/Surgery History Past Medical History:  Diagnosis Date  . Atrial fibrillation ([place])   . Congestive heart failure (CHF) ([place])   . Diabetes mellitus without complication ([place])   . GERD (gastroesophageal reflux disease)   . Hyperlipidemia   . Hypertension    Past Surgical History:  Procedure Laterality Date  . ABDOMINAL HYSTERECTOMY    . APPENDECTOMY    . CATARACT EXTRACTION    . CHOLECYSTECTOMY    . HERNIA REPAIR    . right knee replacement    . right nephroectomy       A IV Location/Drains/Wounds Patient Lines/Drains/Airways Status   Active Line/Drains/Airways    Name:   Placement date:   Placement time:   Site:   Days:   Peripheral IV 01/03/19 Left Forearm   01/03/19    1245    Forearm   less than 1          Intake/Output Last 24 hours No intake or output data in the 24 hours ending 01/03/19  1626  Labs/Imaging Results for orders placed or performed during the hospital encounter of 01/03/19 (from the past 48 hour(s))  Basic metabolic panel     Status: Abnormal   Collection Time: 01/03/19 12:42 PM  Result Value Ref Range   Sodium 131 (L) 135 - 145 mmol/L   Potassium 4.3 3.5 - 5.1 mmol/L   Chloride 96 (L) 98 - 111 mmol/L   CO2 26 22 - 32 mmol/L   Glucose, Bld 102 (H) 70 - 99 mg/dL   BUN 24 (H) 8 - 23 mg/dL   Creatinine, Ser 1.42 (H) 0.44 - 1.00 mg/dL   Calcium 8.6 (L) 8.9 - 10.3 mg/dL   GFR calc non Af Amer 36 (L) >60 mL/min   GFR calc Af Amer 41 (L) >60 mL/min   Anion gap 9 5 - 15    Comment: Performed at [facility], [address] [place], [place] [address]  CBC     Status: None   Collection Time: 01/03/19 12:42 PM  Result Value Ref Range   WBC 7.1 4.0 - 10.5 K/uL   RBC 3.90 3.87 - 5.11 MIL/uL   Hemoglobin 12.4 12.0 - 15.0 g/dL   HCT 36.8 36.0 - 46.0 %   MCV 94.4 80.0 - 100.0 fL   MCH 31.8 26.0 - 34.0 pg   MCHC 33.7 30.0 - 36.0 g/dL   RDW  13.7 11.5 - 15.5 %   Platelets 163 150 - 400 K/uL   nRBC 0.0 0.0 - 0.2 %    Comment: Performed at [facility], [address] [place], [place] [address]  Lactic acid, plasma     Status: None   Collection Time: 01/03/19 12:42 PM  Result Value Ref Range   Lactic Acid, Venous 0.9 0.5 - 1.9 mmol/L    Comment: Performed at [facility], [address] [place], [place] [address]  Urinalysis, Complete w Microscopic     Status: Abnormal   Collection Time: 01/03/19  1:35 PM  Result Value Ref Range   Color, Urine YELLOW (A) YELLOW   APPearance CLEAR (A) CLEAR   Specific Gravity, Urine 1.010 1.005 - 1.030   pH 6.0 5.0 - 8.0   Glucose, UA NEGATIVE NEGATIVE mg/dL   Hgb urine dipstick LARGE (A) NEGATIVE   Bilirubin Urine NEGATIVE NEGATIVE   Ketones, ur NEGATIVE NEGATIVE mg/dL   Protein, ur 100 (A) NEGATIVE mg/dL   Nitrite NEGATIVE NEGATIVE   Leukocytes,Ua NEGATIVE NEGATIVE   RBC / HPF >50 (H) 0 -  5 RBC/hpf   WBC, UA 0-5 0 - 5 WBC/hpf   Bacteria, UA NONE SEEN NONE SEEN   Squamous Epithelial / LPF 0-5 0 - 5   Mucus PRESENT    Hyaline Casts, UA PRESENT     Comment: Performed at [facility], [address], [place], [place] [address]  Influenza panel by PCR (type A & B)     Status: Abnormal   Collection Time: 01/03/19  1:44 PM  Result Value Ref Range   Influenza A By PCR POSITIVE (A) NEGATIVE   Influenza B By PCR NEGATIVE NEGATIVE    Comment: (NOTE) The Xpert Xpress Flu assay is intended as an aid in the diagnosis of  influenza and should not be used as a sole basis for treatment.  This  assay is FDA approved for nasopharyngeal swab specimens only. Nasal  washings and aspirates are unacceptable for Xpert Xpress Flu testing. Performed at [facility], [address] [place],  [address]    Dg Chest 2 View  Result Date: 01/03/2019 CLINICAL DATA:  Cough and fever EXAM: CHEST - 2 VIEW COMPARISON:  06/30/2014 FINDINGS: COPD with hyperinflation and pulmonary scarring bilaterally. No acute infiltrate or effusion. Negative for heart failure. Heart size upper normal.  Atherosclerotic aortic arch. IMPRESSION: COPD.  No acute abnormality. Electronically Signed   By: Franchot Gallo M.D.   On: 01/03/2019 13:32   Ct Chest Wo Contrast  Result Date: 01/03/2019 CLINICAL DATA:  78 year old female with weakness, cough, shortness of breath. Smoker. EXAM: CT CHEST WITHOUT CONTRAST TECHNIQUE: Multidetector CT imaging of the chest was performed following the standard protocol without IV contrast. COMPARISON:  CT Abdomen and Pelvis 01/11/2018. Chest radiographs earlier today. FINDINGS: Cardiovascular: Calcified coronary artery atherosclerosis. Calcified aortic atherosclerosis. Vascular patency is not evaluated in the absence of IV contrast. Borderline to mild cardiomegaly is stable since 2019. No pericardial effusion. Mediastinum/Nodes: Small reactive appearing mediastinal lymph  nodes throughout. No enlarged or heterogeneous nodes. No thoracic inlet or axillary lymphadenopathy. Lungs/Pleura: Small volume dependent retained secretions in the trachea at the thoracic inlet. Major airways are otherwise patent. Chronic increased reticular opacity in the periphery of both lungs. Fibrotic changes appear most pronounced at the lung bases. No overt honeycombing. No superimposed consolidation, pleural effusion or inflammatory appearing pulmonary opacity. There is a small 4-5 millimeter lung nodule in the left  upper lobe on series 3, image 43. Upper Abdomen: Postoperative changes to the right upper quadrant abdominal wall. Gallbladder appears to be surgically absent. Negative visible noncontrast liver, spleen, pancreas, adrenal glands, left kidney, and bowel. Musculoskeletal: No acute osseous abnormality identified. IMPRESSION: 1. Chronic lung disease suspected with peripheral fibrotic changes in both lungs. Small volume retained secretions in the trachea, but no definite superimposed acute pulmonary process. 2. Small 4-5 mm left upper lobe lung nodule. Non-contrast chest CT recommended in 12 months. This recommendation follows the consensus statement: Guidelines for Management of Incidental Pulmonary Nodules Detected on CT Images: From the Fleischner Society 2017; Radiology 2017; 284:228-243. 3. Calcified coronary artery and Aortic Atherosclerosis (ICD10-I70.0). Electronically Signed   By: Genevie Ann M.D.   On: 01/03/2019 15:24    Pending Labs Unresulted Labs (From admission, onward)    Start     Ordered   01/04/19 0500  Hemoglobin A1c  Tomorrow morning,   STAT    Comments:  To assess prior glycemic control    01/03/19 1621   01/03/19 1602  Protime-INR  Add-on,   AD     01/03/19 1601   01/03/19 1245  Lactic acid, plasma  Now then every 2 hours,   STAT     01/03/19 1244   01/03/19 1245  Blood culture (routine x 2)  BLOOD CULTURE X 2,   STAT     01/03/19 1244   Signed and Held  [employer] morning,   R     Signed and Held   Signed and Held  Cortisol-am, blood  Tomorrow morning,   R     Signed and Held   Signed and Held  Procalcitonin  Tomorrow morning,   R     Signed and Held   Signed and Held  Lactic acid, plasma  STAT Now then every 3 hours,   STAT     Signed and Held   Signed and Held  Hemoglobin A1c  Tomorrow morning,   R     Signed and Held   Signed and Held  CBC  Tomorrow morning,   R     Signed and Held   Signed and Held  Comprehensive metabolic panel  Tomorrow morning,   R     Signed and Held          Vitals/Pain Today's Vitals   01/03/19 1430 01/03/19 1500 01/03/19 1530 01/03/19 1600  BP: (!) 156/59 (!) 157/44 (!) 165/58 (!) 133/52  Pulse: 63 67 72 (!) 40  Resp: (!) 24 (!) 24 20 15   Temp:      TempSrc:      SpO2: 96% 95% 95% 97%  Weight:      Height:      PainSc:        Isolation Precautions Droplet precaution  Medications Medications  oseltamivir (TAMIFLU) capsule 75 mg (has no administration in time range)  insulin glargine (LANTUS) injection 10 Units (has no administration in time range)  insulin aspart (novoLOG) injection 0-9 Units (has no administration in time range)  insulin aspart (novoLOG) injection 0-5 Units (has no administration in time range)  acetaminophen (TYLENOL) tablet 650 mg (650 mg Oral Given 01/03/19 1257)  0.9 %  sodium chloride infusion (1,000 mLs Intravenous New Bag/Given 01/03/19 1350)    Mobility walks with person assist Low fall risk   Focused Assessments    R Recommendations: See Admitting Provider Note  Report given to: [facility] RN

## 2019-01-03 NOTE — ED Notes (Signed)
Restarted computer for second time. [name] quest failed to print label; verified white label and birthday and sent to lab

## 2019-01-03 NOTE — Progress Notes (Signed)
Family Meeting Note  Advance Directive:yes  Today a meeting took place with the Patient, daughter at bedside    The following clinical team members were present during this meeting:MD  The following were discussed:Patient's diagnosis: Sepsis, generalized weakness, influenza A, tobacco abuse disorder, insulin requiring diabetes mellitus, chronic atrial fibrillation, chronic kidney disease stage III, hypertension, GERD, hematuria, pulmonary nodule, will be admitted to the hospital and treatment plan of care discussed in detail with the patient and her daughter at bedside.  They both verbalized understanding of the plan.  , Patient's progosis: Unable to determine and Goals for treatment: DNR  Daughter June is healthcare POA  Additional follow-up to be provided: Hospitalist  Time spent during discussion: [place] Saketh Daubert, MD

## 2019-01-03 NOTE — ED Notes (Signed)
..  This tech in pt room at this time to check on pt, pt with no concerns at this time, pt with bed lowered, call light in reach, pt assisted by this writer and EDT Marcie Bal in using the restroom, pt reminded that if any need arises the call light is within reach and to call for help. This tech will continue to monitor pt from monitors at nursing station and will continue to check in with pt hourly

## 2019-01-03 NOTE — ED Triage Notes (Signed)
Pt arrives to ED c/o weakness and shaky legs. Pt states some cough.   A&O, in wheelchair.

## 2019-01-03 NOTE — ED Notes (Signed)
Attempted to print label- error message populating stating lock failed with no associated user. Lab contacted who states they will attempt at clearing lock

## 2019-01-03 NOTE — ED Provider Notes (Signed)
[facility] Emergency Department Provider Note   ____________________________________________    I have reviewed the triage vital signs and the nursing notes.   HISTORY  Chief Complaint Weakness     HPI [name] is a 78 y.o. female who presents with complaints of diffuse weakness.  Patient has a history of atrial fibrillation, CHF, diabetes, chronic kidney disease.  She reports she has felt chills and feverish over the last 24 hours.  She has become more and more weak.  She does report a dry cough.  No significant shortness of breath.  Daughter reports that she has not been able to get up on her own which is very strange for her.  No recent travel  Past Medical History:  Diagnosis Date  . Atrial fibrillation ([place])   . Congestive heart failure (CHF) ([place])   . Diabetes mellitus without complication ([place])   . GERD (gastroesophageal reflux disease)   . Hyperlipidemia   . Hypertension     Patient Active Problem List   Diagnosis Date Noted  . Sepsis ([place]) 01/12/2018  . Colitis 01/12/2018  . CKD (chronic kidney disease), stage III ([place]) 01/12/2018  . Diabetes ([place]) 12/28/2017  . AF (paroxysmal atrial fibrillation) ([place]) 12/28/2017  . Encounter for therapeutic drug monitoring 12/28/2017  . Primary generalized (osteo)arthritis 12/28/2017  . Hypertension 12/27/2017  . GERD (gastroesophageal reflux disease) 12/27/2017    Past Surgical History:  Procedure Laterality Date  . ABDOMINAL HYSTERECTOMY    . APPENDECTOMY    . CATARACT EXTRACTION    . CHOLECYSTECTOMY    . HERNIA REPAIR    . right knee replacement    . right nephroectomy      Prior to Admission medications   Medication Sig Start Date End Date Taking? Authorizing Provider  ACCU-CHEK FASTCLIX LANCETS [place]  05/26/17   [provider]  Alcohol Swabs (B-D SINGLE USE SWABS REGULAR) PADS USE AS DIRECTED TWICE DAILY 12/09/18   Kendell Bane, NP  atorvastatin (LIPITOR) 10 MG  tablet Take 1 tablet (10 mg total) by mouth every evening. 09/09/18   Kendell Bane, NP  citalopram (CELEXA) 40 MG tablet Take 1 tablet (40 mg total) by mouth daily. 10/14/18   Kendell Bane, NP  digoxin (LANOXIN) 0.125 MG tablet TAKE 1 TABLET BY MOUTH ONCE DAILY AFTER FINISHING LOADING DOSE. 12/09/18   Kendell Bane, NP  donepezil (ARICEPT) 5 MG tablet TAKE 1 TABLET BY MOUTH ONCE DAILY FOR MEMORY 02/06/18   Lavera Guise, MD  furosemide (LASIX) 20 MG tablet TAKE 1 TABLET BY MOUTH ONCE DAILY 12/24/18   Kendell Bane, NP  glucose blood (ACCU-CHEK GUIDE) test strip  07/01/17   [provider]  glucose blood test strip Use as instructed to check blood sugars twice a day.  E11.65 10/31/17   Lavera Guise, MD  Insulin Glargine (TOUJEO SOLOSTAR) 300 UNIT/ML SOPN Inject 20 mg into the skin daily.    [provider]  Insulin Pen Needle (BD PEN NEEDLE NANO U/F) 32G X 4 MM MISC  07/04/17   [provider]  lidocaine (LIDODERM) 5 % Place 1 patch onto the skin every 12 (twelve) hours. Remove & Discard patch within 12 hours or as directed by MD 11/21/18 11/21/19  Sable Feil, PA-C  liraglutide (VICTOZA) 18 MG/3ML SOPN Inject 0.2 mLs (1.2 mg total) into the skin daily. 03/21/18   Lavera Guise, MD  metoCLOPramide (REGLAN) 5 MG tablet Take one tab  po bid for nausea and one at bed time prn 01/30/18   Lavera Guise, MD  pantoprazole (PROTONIX) 40 MG tablet Take 1 tablet (40 mg total) by mouth 2 (two) times daily. 03/13/18   Lavera Guise, MD  rOPINIRole (REQUIP) 0.5 MG tablet TAKE 1 TABLET BY MOUTH IN THE EVENING 12/09/18   Kendell Bane, NP  tizanidine (ZANAFLEX) 2 MG capsule  11/12/14   [provider]  TOUJEO SOLOSTAR 300 UNIT/ML SOPN INJECT  16  TO  18 UNITS SUBCUTANEOUSLY AT BEDTIME  ( EXPIRES 42 DAYS AFTER OPENING  ) 09/11/18   Kendell Bane, NP  traMADol (ULTRAM) 50 MG tablet Take 1 tablet (50 mg total) by mouth 2 (two) times daily as needed. for pain 12/12/18    Kendell Bane, NP  Vitamin D, Ergocalciferol, (DRISDOL) 1.25 MG (50000 UT) CAPS capsule Take 1 capsule (50,000 Units total) by mouth every 7 (seven) days. 12/30/18   Kendell Bane, NP  warfarin (COUMADIN) 4 MG tablet Take 1 tablet (4 mg total) by mouth See admin instructions. tk 1.5 tabs on Tuesday and Friday, and 1 tablet on the rest of the days 03/13/18   Lavera Guise, MD     Allergies Patient has no known allergies.  Family History  Problem Relation Age of Onset  . Breast cancer Mother     Social History Social History   Tobacco Use  . Smoking status: Current Every Day Smoker    Packs/day: 1.00    Types: Cigarettes  . Smokeless tobacco: Never Used  Substance Use Topics  . Alcohol use: No  . Drug use: No    Review of Systems  Constitutional: As above Eyes: No visual changes.  ENT: No sore throat. Cardiovascular: Denies chest pain. Respiratory: As above Gastrointestinal: No abdominal pain.  No nausea, no vomiting.   Genitourinary: Negative for dysuria. Musculoskeletal: Negative for back pain. Skin: Negative for rash. Neurological: Negative for headaches    ____________________________________________   PHYSICAL EXAM:  VITAL SIGNS: ED Triage Vitals  Enc Vitals Group     BP 01/03/19 1232 140/63     Pulse Rate 01/03/19 1232 77     Resp 01/03/19 1232 18     Temp 01/03/19 1232 (!) 101 F (38.3 C)     Temp Source 01/03/19 1232 Oral     SpO2 01/03/19 1232 97 %     Weight 01/03/19 1234 53.9 kg (118 lb 13.3 oz)     Height 01/03/19 1234 1.448 m (4\' 9" )     Head Circumference --      Peak Flow --      Pain Score 01/03/19 1234 0     Pain Loc --      Pain Edu? --      Excl. in [place]? --     Constitutional: Alert and oriented. No acute distress.  Eyes: Conjunctivae are normal.   Nose: No congestion/rhinnorhea. Mouth/Throat: Mucous membranes are moist.    Cardiovascular: Normal rate, regular rhythm. Grossly normal heart sounds.  Good peripheral circulation.  Respiratory: Normal respiratory effort.  No retractions. Lungs CTAB. Gastrointestinal: Soft and nontender. No distention.    Musculoskeletal: No lower extremity tenderness nor edema.  Warm and well perfused Neurologic:  Normal speech and language. No gross focal neurologic deficits are appreciated.  Skin:  Skin is warm, dry and intact. No rash noted. Psychiatric: Mood and affect are normal. Speech and behavior are normal.  ____________________________________________   LABS (all labs ordered  are listed, but only abnormal results are displayed)  Labs Reviewed  BASIC METABOLIC PANEL - Abnormal; Notable for the following components:      Result Value   Sodium 131 (*)    Chloride 96 (*)    Glucose, Bld 102 (*)    BUN 24 (*)    Creatinine, Ser 1.42 (*)    Calcium 8.6 (*)    GFR calc non Af Amer 36 (*)    GFR calc Af Amer 41 (*)    All other components within normal limits  URINALYSIS, COMPLETE (UACMP) WITH MICROSCOPIC - Abnormal; Notable for the following components:   Color, Urine YELLOW (*)    APPearance CLEAR (*)    Hgb urine dipstick LARGE (*)    Protein, ur 100 (*)    RBC / HPF >50 (*)    All other components within normal limits  CULTURE, BLOOD (ROUTINE X 2)  CULTURE, BLOOD (ROUTINE X 2)  CBC  LACTIC ACID, PLASMA  LACTIC ACID, PLASMA  INFLUENZA PANEL BY PCR (TYPE A & B)  CBG MONITORING, ED   ____________________________________________  EKG  ED ECG REPORT I, Lav[place] Drafts, the attending physician, personally viewed and interpreted this ECG.  Date: 01/03/2019  Rhythm: Atrial fibrillation QRS Axis: normal Intervals: normal ST/T Wave abnormalities: normal Narrative Interpretation: no evidence of acute ischemia  ____________________________________________  RADIOLOGY  Chest x-ray unremarkable ____________________________________________   PROCEDURES  Procedure(s) performed: No  Procedures   Critical Care performed: No  ____________________________________________   INITIAL IMPRESSION / ASSESSMENT AND PLAN / ED COURSE  Pertinent labs & imaging results that were available during my care of the patient were reviewed by me and considered in my medical decision making (see chart for details).  Patient presents with diffuse weakness, fatigue.  Significant comorbidities.  Differential includes pneumonia, sepsis, influenza, urinary tract infection.  Pending labs, IV fluids, chest x-ray and influenza test  Chest x-ray is negative for pneumonia however strong suspicion will send for Noncon CT chest.  Still pending influenza  Given her weakness I do anticipate admission    ____________________________________________   FINAL CLINICAL IMPRESSION(S) / ED DIAGNOSES  Generalized weakness Pneumonia     Note:  This document was prepared using Dragon voice recognition software and may include unintentional dictation errors.   L[place]onia Drafts, MD 01/03/19 [phone]

## 2019-01-03 NOTE — ED Provider Notes (Signed)
Flu test is returned positive.  Her urine shows some hematuria.  CT of the chest shows a nodule with recommendation for repeat CT later.  No pneumonia.  Unable to get up out of bed to go to the bathroom without being very shaky.  She needs assistance to get to the bathroom.  We will get her in the hospital is unsafe for her to go home.   Nena Polio, MD 01/03/19 1544

## 2019-01-03 NOTE — H&P (Addendum)
[facility] [place] NAME: [name]    MR#:  [id number]  DATE OF BIRTH:  June 11, 1941  DATE OF ADMISSION:  01/03/2019  PRIMARY CARE PHYSICIAN: Lavera Guise, MD   REQUESTING/REFERRING PHYSICIAN: Cinda Quest MD   CHIEF COMPLAINT:   Weakness and fever HISTORY OF PRESENT ILLNESS:  [name]  is a 78 y.o. female with a known history of chronic atrial fibrillation on Coumadin, insulin requiring diabetes mellitus, chronic kidney disease stage III, GERD, hyperlipidemia, hypertension and other medical problems living at home with her daughter came into the ED with a chief complaint of generalized weakness.  Patient was having fever with chills for the past 24 hours and feeling extremely weak associated with a dry cough.  Denies any chest pain or shortness of breath.  Influenza test is positive for influenza A.  Hospitalist team is called to admit the patient.  Patient reports extreme weakness associated with cough.  [name] at bedside.  PAST MEDICAL HISTORY:   Past Medical History:  Diagnosis Date  . Atrial fibrillation ([place])   . Congestive heart failure (CHF) ([place])   . Diabetes mellitus without complication ([place])   . GERD (gastroesophageal reflux disease)   . Hyperlipidemia   . Hypertension     PAST SURGICAL HISTOIRY:   Past Surgical History:  Procedure Laterality Date  . ABDOMINAL HYSTERECTOMY    . APPENDECTOMY    . CATARACT EXTRACTION    . CHOLECYSTECTOMY    . HERNIA REPAIR    . right knee replacement    . right nephroectomy      SOCIAL HISTORY:   Social History   Tobacco Use  . Smoking status: Current Every Day Smoker    Packs/day: 1.00    Types: Cigarettes  . Smokeless tobacco: Never Used  Substance Use Topics  . Alcohol use: No    FAMILY HISTORY:   Family History  Problem Relation Age of Onset  . Breast cancer Mother     DRUG ALLERGIES:  No Known Allergies  REVIEW OF SYSTEMS:   CONSTITUTIONAL: No fever, fatigue or weakness.  EYES: No blurred or double vision.  EARS, NOSE, AND THROAT: No tinnitus or ear pain.  RESPIRATORY: Endorsing dry cough, exertional shortness of breath, denies wheezing or hemoptysis.  CARDIOVASCULAR: No chest pain, orthopnea, edema.  GASTROINTESTINAL: No nausea, vomiting, diarrhea or abdominal pain.  GENITOURINARY: No dysuria, hematuria.  ENDOCRINE: No polyuria, nocturia,  HEMATOLOGY: No anemia, easy bruising or bleeding SKIN: No rash or lesion. MUSCULOSKELETAL: No joint pain or arthritis.   NEUROLOGIC: No tingling, numbness, weakness.  PSYCHIATRY: No anxiety or depression.   MEDICATIONS AT HOME:   Prior to Admission medications   Medication Sig Start Date End Date Taking? Authorizing Provider  ACCU-CHEK FASTCLIX LANCETS [place]  05/26/17   [provider]  Alcohol Swabs (B-D SINGLE USE SWABS REGULAR) PADS USE AS DIRECTED TWICE DAILY 12/09/18   Kendell Bane, NP  atorvastatin (LIPITOR) 10 MG tablet Take 1 tablet (10 mg total) by mouth every evening. 09/09/18   Kendell Bane, NP  citalopram (CELEXA) 40 MG tablet Take 1 tablet (40 mg total) by mouth daily. 10/14/18   Kendell Bane, NP  digoxin (LANOXIN) 0.125 MG tablet TAKE 1 TABLET BY MOUTH ONCE DAILY AFTER FINISHING LOADING DOSE. 12/09/18   Kendell Bane, NP  donepezil (ARICEPT) 5 MG tablet TAKE 1 TABLET BY MOUTH ONCE DAILY FOR MEMORY 02/06/18   Lavera Guise, MD  furosemide (LASIX) 20 MG tablet TAKE 1 TABLET BY MOUTH ONCE DAILY 12/24/18   Kendell Bane, NP  glucose blood (ACCU-CHEK GUIDE) test strip  07/01/17   [provider]  glucose blood test strip Use as instructed to check blood sugars twice a day.  E11.65 10/31/17   Lavera Guise, MD  Insulin Glargine (TOUJEO SOLOSTAR) 300 UNIT/ML SOPN Inject 20 mg into the skin daily.    [provider]  Insulin Pen Needle (BD PEN NEEDLE NANO U/F) 32G X 4 MM MISC  07/04/17   [provider]  lidocaine  (LIDODERM) 5 % Place 1 patch onto the skin every 12 (twelve) hours. Remove & Discard patch within 12 hours or as directed by MD 11/21/18 11/21/19  Sable Feil, PA-C  liraglutide (VICTOZA) 18 MG/3ML SOPN Inject 0.2 mLs (1.2 mg total) into the skin daily. 03/21/18   Lavera Guise, MD  metoCLOPramide (REGLAN) 5 MG tablet Take one tab po bid for nausea and one at bed time prn 01/30/18   Lavera Guise, MD  pantoprazole (PROTONIX) 40 MG tablet Take 1 tablet (40 mg total) by mouth 2 (two) times daily. 03/13/18   Lavera Guise, MD  rOPINIRole (REQUIP) 0.5 MG tablet TAKE 1 TABLET BY MOUTH IN THE EVENING 12/09/18   Kendell Bane, NP  tizanidine (ZANAFLEX) 2 MG capsule  11/12/14   [provider]  TOUJEO SOLOSTAR 300 UNIT/ML SOPN INJECT  16  TO  18 UNITS SUBCUTANEOUSLY AT BEDTIME  ( EXPIRES 42 DAYS AFTER OPENING  ) 09/11/18   Kendell Bane, NP  traMADol (ULTRAM) 50 MG tablet Take 1 tablet (50 mg total) by mouth 2 (two) times daily as needed. for pain 12/12/18   Kendell Bane, NP  Vitamin D, Ergocalciferol, (DRISDOL) 1.25 MG (50000 UT) CAPS capsule Take 1 capsule (50,000 Units total) by mouth every 7 (seven) days. 12/30/18   Kendell Bane, NP  warfarin (COUMADIN) 4 MG tablet Take 1 tablet (4 mg total) by mouth See admin instructions. tk 1.5 tabs on Tuesday and Friday, and 1 tablet on the rest of the days 03/13/18   Lavera Guise, MD      VITAL SIGNS:  Blood pressure (!) 156/59, pulse 63, temperature (!) 101 F (38.3 C), temperature source Oral, resp. rate (!) 24, height '4\' 9"'  (1.448 m), weight 53.9 kg, SpO2 96 %.  PHYSICAL EXAMINATION:  GENERAL:  78 y.o.-year-old patient lying in the bed with no acute distress.  EYES: Pupils equal, round, reactive to light and accommodation. No scleral icterus. Extraocular muscles intact.  HEENT: Head atraumatic, normocephalic. Oropharynx and nasopharynx clear.  NECK:  Supple, no jugular venous distention. No thyroid enlargement, no tenderness.  LUNGS: Normal  breath sounds bilaterally, no wheezing, rales,rhonchi or crepitation. No use of accessory muscles of respiration.  CARDIOVASCULAR: Irregularly irregular no murmurs, rubs, or gallops.  ABDOMEN: Soft, nontender, nondistended. Bowel sounds present.  EXTREMITIES: No pedal edema, cyanosis, or clubbing.  NEUROLOGIC: Awake, alert and oriented x3 sensation intact. Gait not checked.  PSYCHIATRIC: The patient is alert and oriented x 3.  SKIN: No obvious rash, lesion, or ulcer.   LABORATORY PANEL:   CBC Recent Labs  Lab 01/03/19 1242  WBC 7.1  HGB 12.4  HCT 36.8  PLT 163   ------------------------------------------------------------------------------------------------------------------  Chemistries  Recent Labs  Lab 01/03/19 1242  NA 131*  K 4.3  CL 96*  CO2 26  GLUCOSE 102*  BUN 24*  CREATININE 1.42*  CALCIUM 8.6*   ------------------------------------------------------------------------------------------------------------------  Cardiac Enzymes No results for input(s): TROPONINI in the last 168 hours. ------------------------------------------------------------------------------------------------------------------  RADIOLOGY:  Dg Chest 2 View  Result Date: 01/03/2019 CLINICAL DATA:  Cough and fever EXAM: CHEST - 2 VIEW COMPARISON:  06/30/2014 FINDINGS: COPD with hyperinflation and pulmonary scarring bilaterally. No acute infiltrate or effusion. Negative for heart failure. Heart size upper normal.  Atherosclerotic aortic arch. IMPRESSION: COPD.  No acute abnormality. Electronically Signed   By: Franchot Gallo M.D.   On: 01/03/2019 13:32   Ct Chest Wo Contrast  Result Date: 01/03/2019 CLINICAL DATA:  78 year old female with weakness, cough, shortness of breath. Smoker. EXAM: CT CHEST WITHOUT CONTRAST TECHNIQUE: Multidetector CT imaging of the chest was performed following the standard protocol without IV contrast. COMPARISON:  CT Abdomen and Pelvis 01/11/2018. Chest radiographs  earlier today. FINDINGS: Cardiovascular: Calcified coronary artery atherosclerosis. Calcified aortic atherosclerosis. Vascular patency is not evaluated in the absence of IV contrast. Borderline to mild cardiomegaly is stable since 2019. No pericardial effusion. Mediastinum/Nodes: Small reactive appearing mediastinal lymph nodes throughout. No enlarged or heterogeneous nodes. No thoracic inlet or axillary lymphadenopathy. Lungs/Pleura: Small volume dependent retained secretions in the trachea at the thoracic inlet. Major airways are otherwise patent. Chronic increased reticular opacity in the periphery of both lungs. Fibrotic changes appear most pronounced at the lung bases. No overt honeycombing. No superimposed consolidation, pleural effusion or inflammatory appearing pulmonary opacity. There is a small 4-5 millimeter lung nodule in the left upper lobe on series 3, image 43. Upper Abdomen: Postoperative changes to the right upper quadrant abdominal wall. Gallbladder appears to be surgically absent. Negative visible noncontrast liver, spleen, pancreas, adrenal glands, left kidney, and bowel. Musculoskeletal: No acute osseous abnormality identified. IMPRESSION: 1. Chronic lung disease suspected with peripheral fibrotic changes in both lungs. Small volume retained secretions in the trachea, but no definite superimposed acute pulmonary process. 2. Small 4-5 mm left upper lobe lung nodule. Non-contrast chest CT recommended in 12 months. This recommendation follows the consensus statement: Guidelines for Management of Incidental Pulmonary Nodules Detected on CT Images: From the Fleischner Society 2017; Radiology 2017; 284:228-243. 3. Calcified coronary artery and Aortic Atherosclerosis (ICD10-I70.0). Electronically Signed   By: Genevie Ann M.D.   On: 01/03/2019 15:24    EKG:   Orders placed or performed during the hospital encounter of 01/03/19  . ED EKG  . ED EKG    IMPRESSION AND PLAN:    #Sepsis from  influenza A Admit to [place] unit Patient met septic criteria at the time of admission with fever and tachypnea Tamiflu twice daily Hydrate with IV fluids Normal lactic acid at the time of admission we will trend lactic acid every 3 hours x 3 and check procalcitonin level No leukocytosis  #Insulin requiring diabetes mellitus Continue Lantus 10 units nightly and sliding scale insulin  #Chronic atrial fibrillation rate controlled Home medications need to be reconciled after verification Coumadin per pharmacy Check PT/INR  #Hematuria could be from Coumadin Check PT/INR and closely monitor hemoglobin Repeat urinalysis  #Pulmonary nodule Repeat CT chest in 12 months  #Chronic kidney disease stage III Renal function seems to be at her baseline Avoid nephrotoxins and monitor renal function closely  #Essential hypertension Blood pressure is elevated IV Lopressor as needed Resume home medications after med reconciliation  #Generalized weakness PT consult  #Tobacco abuse disorder counseled patient to quit smoking for 5 minutes.  She verbalized understanding will provide nicotine patch  #DVT prophylaxis on Coumadin   All  the records are reviewed and case discussed with ED provider. Management plans discussed with the patient, family and they are in agreement.  CODE STATUS: DNR  TOTAL TIME TAKING CARE OF THIS PATIENT: 45 minutes.   Note: This dictation was prepared with Dragon dictation along with smaller phrase technology. Any transcriptional errors that result from this process are unintentional.  Nicholes Mango M.D on 01/03/2019 at 4:21 PM  Between 7am to 6pm - Pager - [phone]  After 6pm go to [email or web address] - password [email or web address] [facility]  [facility]  Office  [phone]  CC: Primary care physician; Lavera Guise, MD

## 2019-01-04 LAB — CBC
HCT: 35.4 % — ABNORMAL LOW (ref 36.0–46.0)
Hemoglobin: 11.7 g/dL — ABNORMAL LOW (ref 12.0–15.0)
MCH: 31.4 pg (ref 26.0–34.0)
MCHC: 33.1 g/dL (ref 30.0–36.0)
MCV: 94.9 fL (ref 80.0–100.0)
Platelets: 142 10*3/uL — ABNORMAL LOW (ref 150–400)
RBC: 3.73 MIL/uL — ABNORMAL LOW (ref 3.87–5.11)
RDW: 13.7 % (ref 11.5–15.5)
WBC: 4.8 10*3/uL (ref 4.0–10.5)
nRBC: 0 % (ref 0.0–0.2)

## 2019-01-04 LAB — COMPREHENSIVE METABOLIC PANEL
ALT: 12 U/L (ref 0–44)
AST: 24 U/L (ref 15–41)
Albumin: 2.7 g/dL — ABNORMAL LOW (ref 3.5–5.0)
Alkaline Phosphatase: 50 U/L (ref 38–126)
Anion gap: 5 (ref 5–15)
BUN: 23 mg/dL (ref 8–23)
CHLORIDE: 107 mmol/L (ref 98–111)
CO2: 25 mmol/L (ref 22–32)
Calcium: 8.2 mg/dL — ABNORMAL LOW (ref 8.9–10.3)
Creatinine, Ser: 1.2 mg/dL — ABNORMAL HIGH (ref 0.44–1.00)
GFR calc Af Amer: 50 mL/min — ABNORMAL LOW (ref >60)
GFR calc non Af Amer: 44 mL/min — ABNORMAL LOW (ref >60)
Glucose, Bld: 77 mg/dL (ref 70–99)
POTASSIUM: 4.2 mmol/L (ref 3.5–5.1)
Sodium: 137 mmol/L (ref 135–145)
Total Bilirubin: 0.7 mg/dL (ref 0.3–1.2)
Total Protein: 6 g/dL — ABNORMAL LOW (ref 6.5–8.1)

## 2019-01-04 LAB — GLUCOSE, CAPILLARY
GLUCOSE-CAPILLARY: 62 mg/dL — AB (ref 70–99)
GLUCOSE-CAPILLARY: 65 mg/dL — AB (ref 70–99)
Glucose-Capillary: 99 mg/dL (ref 70–99)
Glucose-Capillary: 99 mg/dL (ref 70–99)
Glucose-Capillary: 78 mg/dL (ref 70–99)
Glucose-Capillary: 77 mg/dL (ref 70–99)

## 2019-01-04 LAB — URINALYSIS, COMPLETE (UACMP) WITH MICROSCOPIC
Bilirubin Urine: NEGATIVE
GLUCOSE, UA: NEGATIVE mg/dL
Ketones, ur: NEGATIVE mg/dL
Leukocytes,Ua: NEGATIVE
Nitrite: NEGATIVE
Protein, ur: 100 mg/dL — AB
RBC / HPF: >50 RBC/hpf — ABNORMAL HIGH (ref 0–5)
Specific Gravity, Urine: 1.011 (ref 1.005–1.030)
pH: 6 (ref 5.0–8.0)

## 2019-01-04 LAB — LACTIC ACID, PLASMA: Lactic Acid, Venous: 0.6 mmol/L (ref 0.5–1.9)

## 2019-01-04 LAB — HEMOGLOBIN A1C
Hgb A1c MFr Bld: 6.1 % — ABNORMAL HIGH (ref 4.8–5.6)
Mean Plasma Glucose: 128.37 mg/dL

## 2019-01-04 LAB — PROCALCITONIN: Procalcitonin: <0.1 ng/mL

## 2019-01-04 LAB — PROTIME-INR
INR: 1.4 — ABNORMAL HIGH (ref 0.8–1.2)
Prothrombin Time: 16.5 seconds — ABNORMAL HIGH (ref 11.4–15.2)

## 2019-01-04 LAB — CORTISOL-AM, BLOOD: Cortisol - AM: 6.2 ug/dL — ABNORMAL LOW (ref 6.7–22.6)

## 2019-01-04 MED ORDER — WARFARIN - PHARMACIST DOSING INPATIENT: Freq: Every day | Status: DC

## 2019-01-04 MED ORDER — PANTOPRAZOLE SODIUM 40 MG PO TBEC
40.0000 mg | DELAYED_RELEASE_TABLET | Freq: Two times a day (BID) | ORAL | Status: DC
  Administered 2019-01-04 – 2019-01-05 (×3): 40 mg via ORAL
  Filled 2019-01-04 (×3): qty 1

## 2019-01-04 MED ORDER — OSELTAMIVIR PHOSPHATE 30 MG PO CAPS
30.0000 mg | ORAL_CAPSULE | Freq: Two times a day (BID) | ORAL | Status: DC
  Administered 2019-01-04 – 2019-01-05 (×3): 30 mg via ORAL
  Filled 2019-01-04 (×3): qty 1

## 2019-01-04 MED ORDER — CITALOPRAM HYDROBROMIDE 20 MG PO TABS
40.0000 mg | ORAL_TABLET | Freq: Every day | ORAL | Status: DC
  Administered 2019-01-04 – 2019-01-05 (×2): 40 mg via ORAL
  Filled 2019-01-04 (×2): qty 2

## 2019-01-04 MED ORDER — DIGOXIN 125 MCG PO TABS
0.1250 mg | ORAL_TABLET | Freq: Every day | ORAL | Status: DC
  Administered 2019-01-04 – 2019-01-05 (×2): 0.125 mg via ORAL
  Filled 2019-01-04 (×3): qty 1

## 2019-01-04 MED ORDER — ROPINIROLE HCL 1 MG PO TABS
0.5000 mg | ORAL_TABLET | Freq: Every evening | ORAL | Status: DC
  Administered 2019-01-04: 17:00:00 0.5 mg via ORAL
  Filled 2019-01-04: qty 1

## 2019-01-04 MED ORDER — DONEPEZIL HCL 5 MG PO TABS
5.0000 mg | ORAL_TABLET | Freq: Every day | ORAL | Status: DC
  Administered 2019-01-04: 5 mg via ORAL
  Filled 2019-01-04: qty 1

## 2019-01-04 MED ORDER — ATORVASTATIN CALCIUM 20 MG PO TABS
10.0000 mg | ORAL_TABLET | Freq: Every evening | ORAL | Status: DC
  Administered 2019-01-04: 17:00:00 10 mg via ORAL
  Filled 2019-01-04: qty 1

## 2019-01-04 NOTE — Progress Notes (Signed)
[place] for Coumadin dosing Indication: atrial fibrillation  No Known Allergies  Patient Measurements: Height: 4\' 9"  (144.8 cm) Weight: 118 lb 13.3 oz (53.9 kg) IBW/kg (Calculated) : 38.6  Vital Signs: Temp: 98.2 F (36.8 C) (03/14 0954) Temp Source: Oral (03/14 0954) BP: 140/46 (03/14 0954) Pulse Rate: 66 (03/14 0954)  Labs: INR- 1.5 Recent Labs    01/03/19 1242 01/03/19 1602 01/04/19 0534  HGB 12.4  --  11.7*  HCT 36.8  --  35.4*  PLT 163  --  142*  LABPROT  --  18.2* 16.5*  INR  --  1.5* 1.4*  CREATININE 1.42*  --  1.20*    Estimated Creatinine Clearance: 27.7 mL/min (A) (by C-G formula based on SCr of 1.2 mg/dL (H)).   Medical History: Past Medical History:  Diagnosis Date  . Atrial fibrillation ([place])   . Congestive heart failure (CHF) ([place])   . Diabetes mellitus without complication ([place])   . GERD (gastroesophageal reflux disease)   . Hyperlipidemia   . Hypertension     Medications:  Scheduled:  . docusate sodium  100 mg Oral BID  . insulin aspart  0-5 Units Subcutaneous QHS  . insulin aspart  0-9 Units Subcutaneous TID WC  . insulin glargine  10 Units Subcutaneous QHS  . nicotine  21 mg Transdermal Daily  . oseltamivir  30 mg Oral BID    Assessment: 78 year old female with CC of weakness and fever. Influenza A positive. History of Atrial fibrillation treated with Coumadin at home. Home dose of Coumadin per home med list is 4mg  Monday, Wednesday, Thursday, Saturday and Sunday. 6mg  on Tuesday and Friday. Patient now with hematuria possibly from Coumadin.  3/13 INR 1.5  Warfarin 6 mg 3/14 INR 1.4   Goal of Therapy:  INR 2-3  Plan: Coumadin 6 mg x1 again today and follow up with INR 3/14. Hgb 11.7 , Plt 142   Brittini Brubeck A 01/04/2019,10:21 AM

## 2019-01-04 NOTE — Progress Notes (Signed)
[facility] at [place] NAME: [name]    MR#:  [id number]  DATE OF BIRTH:  August 17, 1941  SUBJECTIVE:  CHIEF COMPLAINT:   Chief Complaint  Patient presents with  . Weakness   Came with generalized weakness.  Noted to have influenza.  Feels slightly better today. REVIEW OF SYSTEMS:  CONSTITUTIONAL: No fever, have fatigue or weakness.  EYES: No blurred or double vision.  EARS, NOSE, AND THROAT: No tinnitus or ear pain.  RESPIRATORY: Have cough, no shortness of breath, wheezing or hemoptysis.  CARDIOVASCULAR: No chest pain, orthopnea, edema.  GASTROINTESTINAL: No nausea, vomiting, diarrhea or abdominal pain.  GENITOURINARY: No dysuria, hematuria.  ENDOCRINE: No polyuria, nocturia,  HEMATOLOGY: No anemia, easy bruising or bleeding SKIN: No rash or lesion. MUSCULOSKELETAL: No joint pain or arthritis.   NEUROLOGIC: No tingling, numbness, weakness.  PSYCHIATRY: No anxiety or depression.   ROS  DRUG ALLERGIES:  No Known Allergies  VITALS:  Blood pressure (!) 140/46, pulse 66, temperature 98.2 F (36.8 C), temperature source Oral, resp. rate 19, height _0  (1.448 m), weight 53.9 kg, SpO2 99 %.  PHYSICAL EXAMINATION:  GENERAL:  78 y.o.-year-old patient lying in the bed with no acute distress.  EYES: Pupils equal, round, reactive to light and accommodation. No scleral icterus. Extraocular muscles intact.  HEENT: Head atraumatic, normocephalic. Oropharynx and nasopharynx clear.  NECK:  Supple, no jugular venous distention. No thyroid enlargement, no tenderness.  LUNGS: Normal breath sounds bilaterally, some wheezing, no crepitation. No use of accessory muscles of respiration.  CARDIOVASCULAR: S1, S2 normal. No murmurs, rubs, or gallops.  ABDOMEN: Soft, nontender, nondistended. Bowel sounds present. No organomegaly or mass.  EXTREMITIES: No pedal edema, cyanosis, or clubbing.  NEUROLOGIC: Cranial nerves II through XII are intact. Muscle  strength 4/5 in all extremities. Sensation intact. Gait not checked.  PSYCHIATRIC: The patient is alert and oriented x 3.  SKIN: No obvious rash, lesion, or ulcer.   Physical Exam LABORATORY PANEL:   CBC Recent Labs  Lab 01/04/19 0534  WBC 4.8  HGB 11.7*  HCT 35.4*  PLT 142*   ------------------------------------------------------------------------------------------------------------------  Chemistries  Recent Labs  Lab 01/04/19 0534  NA 137  K 4.2  CL 107  CO2 25  GLUCOSE 77  BUN 23  CREATININE 1.20*  CALCIUM 8.2*  AST 24  ALT 12  ALKPHOS 50  BILITOT 0.7   ------------------------------------------------------------------------------------------------------------------  Cardiac Enzymes No results for input(s): TROPONINI in the last 168 hours. ------------------------------------------------------------------------------------------------------------------  RADIOLOGY:  Dg Chest 2 View  Result Date: 01/03/2019 CLINICAL DATA:  Cough and fever EXAM: CHEST - 2 VIEW COMPARISON:  06/30/2014 FINDINGS: COPD with hyperinflation and pulmonary scarring bilaterally. No acute infiltrate or effusion. Negative for heart failure. Heart size upper normal.  Atherosclerotic aortic arch. IMPRESSION: COPD.  No acute abnormality. Electronically Signed   By: Franchot Gallo M.D.   On: 01/03/2019 13:32   Ct Chest Wo Contrast  Result Date: 01/03/2019 CLINICAL DATA:  78 year old female with weakness, cough, shortness of breath. Smoker. EXAM: CT CHEST WITHOUT CONTRAST TECHNIQUE: Multidetector CT imaging of the chest was performed following the standard protocol without IV contrast. COMPARISON:  CT Abdomen and Pelvis 01/11/2018. Chest radiographs earlier today. FINDINGS: Cardiovascular: Calcified coronary artery atherosclerosis. Calcified aortic atherosclerosis. Vascular patency is not evaluated in the absence of IV contrast. Borderline to mild cardiomegaly is stable since 2019. No pericardial  effusion. Mediastinum/Nodes: Small reactive appearing mediastinal lymph nodes throughout. No enlarged or heterogeneous nodes. No thoracic  inlet or axillary lymphadenopathy. Lungs/Pleura: Small volume dependent retained secretions in the trachea at the thoracic inlet. Major airways are otherwise patent. Chronic increased reticular opacity in the periphery of both lungs. Fibrotic changes appear most pronounced at the lung bases. No overt honeycombing. No superimposed consolidation, pleural effusion or inflammatory appearing pulmonary opacity. There is a small 4-5 millimeter lung nodule in the left upper lobe on series 3, image 43. Upper Abdomen: Postoperative changes to the right upper quadrant abdominal wall. Gallbladder appears to be surgically absent. Negative visible noncontrast liver, spleen, pancreas, adrenal glands, left kidney, and bowel. Musculoskeletal: No acute osseous abnormality identified. IMPRESSION: 1. Chronic lung disease suspected with peripheral fibrotic changes in both lungs. Small volume retained secretions in the trachea, but no definite superimposed acute pulmonary process. 2. Small 4-5 mm left upper lobe lung nodule. Non-contrast chest CT recommended in 12 months. This recommendation follows the consensus statement: Guidelines for Management of Incidental Pulmonary Nodules Detected on CT Images: From the Fleischner Society 2017; Radiology 2017; 284:228-243. 3. Calcified coronary artery and Aortic Atherosclerosis (ICD10-I70.0). Electronically Signed   By: Genevie Ann M.D.   On: 01/03/2019 15:24    ASSESSMENT AND PLAN:   Active Problems:   Sepsis ([place])  #Sepsis from influenza A  Patient met septic criteria at the time of admission with fever and tachypnea Tamiflu twice daily Hydrate with IV fluids Normal lactic acid at the time of admission ,No leukocytosis  #Insulin requiring diabetes mellitus Continue Lantus 10 units nightly and sliding scale insulin  #Chronic atrial  fibrillation rate controlled COnt dogoxin Coumadin per pharmacy  #Hematuria could be from Coumadin Check PT/INR and closely monitor hemoglobin Repeat urinalysis- monitor CBC.  #Pulmonary nodule Repeat CT chest in 12 months  #Chronic kidney disease stage III Renal function seems to be at her baseline Avoid nephrotoxins and monitor renal function closely  #Essential hypertension Blood pressure is elevated IV Lopressor as needed Resume home medications after med reconciliation  #Generalized weakness PT consult  #Tobacco abuse disorder counseled patient to quit smoking for 5 minutes.  She verbalized understanding will provide nicotine patch  #DVT prophylaxis on Coumadin   All the records are reviewed and case discussed with Care Management/Social Workerr. Management plans discussed with the patient, family and they are in agreement.  CODE STATUS: DNR  TOTAL TIME TAKING CARE OF THIS PATIENT: 35 minutes.     POSSIBLE D/C IN 1-2 DAYS, DEPENDING ON CLINICAL CONDITION.   Vaughan Basta M.D on 01/04/2019   Between 7am to 6pm - Pager - [phone]  After 6pm go to [email or web address] - password [email or web address] [facility]  Office  [phone]  CC: Primary care physician; Lavera Guise, MD  Note: This dictation was prepared with Dragon dictation along with smaller phrase technology. Any transcriptional errors that result from this process are unintentional.

## 2019-01-05 LAB — BASIC METABOLIC PANEL
Anion gap: 6 (ref 5–15)
BUN: 20 mg/dL (ref 8–23)
CO2: 25 mmol/L (ref 22–32)
Calcium: 8.2 mg/dL — ABNORMAL LOW (ref 8.9–10.3)
Chloride: 108 mmol/L (ref 98–111)
Creatinine, Ser: 1.25 mg/dL — ABNORMAL HIGH (ref 0.44–1.00)
GFR calc non Af Amer: 41 mL/min — ABNORMAL LOW (ref >60)
GFR, EST AFRICAN AMERICAN: 48 mL/min — AB (ref >60)
Glucose, Bld: 89 mg/dL (ref 70–99)
POTASSIUM: 4 mmol/L (ref 3.5–5.1)
Sodium: 139 mmol/L (ref 135–145)

## 2019-01-05 LAB — PROTIME-INR
INR: 1.3 — ABNORMAL HIGH (ref 0.8–1.2)
Prothrombin Time: 16 seconds — ABNORMAL HIGH (ref 11.4–15.2)

## 2019-01-05 LAB — GLUCOSE, CAPILLARY
Glucose-Capillary: 120 mg/dL — ABNORMAL HIGH (ref 70–99)
Glucose-Capillary: 83 mg/dL (ref 70–99)
Glucose-Capillary: 200 mg/dL — ABNORMAL HIGH (ref 70–99)

## 2019-01-05 MED ORDER — OSELTAMIVIR PHOSPHATE 30 MG PO CAPS: 30.0000 mg | ORAL_CAPSULE | Freq: Two times a day (BID) | ORAL | 0 refills | Status: DC

## 2019-01-05 MED ORDER — WARFARIN SODIUM 6 MG PO TABS
6.0000 mg | ORAL_TABLET | Freq: Once | ORAL | Status: DC
  Filled 2019-01-05: qty 1

## 2019-01-05 MED ORDER — INSULIN GLARGINE 300 UNIT/ML ~~LOC~~ SOPN: 14.0000 [IU] | PEN_INJECTOR | Freq: Every day | SUBCUTANEOUS | 0 refills | Status: DC

## 2019-01-05 MED ORDER — OXYCODONE-ACETAMINOPHEN 5-325 MG PO TABS
1.0000 | ORAL_TABLET | ORAL | Status: DC | PRN
  Administered 2019-01-05: 1 via ORAL
  Filled 2019-01-05: qty 1

## 2019-01-05 MED ORDER — INSULIN GLARGINE 100 UNIT/ML ~~LOC~~ SOLN
6.0000 [IU] | Freq: Every day | SUBCUTANEOUS | Status: DC
  Administered 2019-01-05: 6 [IU] via SUBCUTANEOUS
  Filled 2019-01-05: qty 0.06

## 2019-01-05 MED ORDER — GLUCERNA SHAKE PO LIQD: 237.0000 mL | Freq: Three times a day (TID) | ORAL | Status: DC

## 2019-01-05 MED ORDER — INSULIN GLARGINE 100 UNIT/ML ~~LOC~~ SOLN: 6.0000 [IU] | Freq: Every day | SUBCUTANEOUS | Status: DC

## 2019-01-05 MED ORDER — WARFARIN SODIUM 6 MG PO TABS
6.0000 mg | ORAL_TABLET | Freq: Once | ORAL | Status: AC
  Administered 2019-01-05: 6 mg via ORAL
  Filled 2019-01-05: qty 1

## 2019-01-05 NOTE — Progress Notes (Signed)
Pt has been discharged home.  AVS given and explained to pt's daughter, [name].  Pt's daughter verbalized understanding.  Rx given.

## 2019-01-05 NOTE — Discharge Summary (Signed)
[facility] [place] NAME: [name]    MR#:  [id number]  DATE OF BIRTH:  [birth year]  DATE OF ADMISSION:  01/03/2019 ADMITTING PHYSICIAN: Nicholes Mango, MD  DATE OF DISCHARGE: 01/05/2019   PRIMARY CARE PHYSICIAN: Lavera Guise, MD    ADMISSION DIAGNOSIS:  Weakness [R53.1] Influenza [J11.1]  DISCHARGE DIAGNOSIS:  Active Problems:   Sepsis ([place])  Influenza A SECONDARY DIAGNOSIS:   Past Medical History:  Diagnosis Date  . Atrial fibrillation ([place])   . Congestive heart failure (CHF) ([place])   . Diabetes mellitus without complication ([place])   . GERD (gastroesophageal reflux disease)   . Hyperlipidemia   . Hypertension     HOSPITAL COURSE:   #Sepsis from influenza A  Patient met septic criteria at the time of admission with fever and tachypnea Tamiflu twice daily Hydrate with IV fluids Normal lactic acid at the time of admission ,No leukocytosis On Tamiflu patient had improvement in the condition.  #Insulin requiring diabetes mellitus Continue Lantus 10 units nightly and sliding scale insulin  #Chronic atrial fibrillation rate controlled COnt dogoxin Coumadin per pharmacy  #Hematuria could be from Coumadin Check PT/INR and closely monitor hemoglobin Repeat urinalysis- monitor CBC.  #Pulmonary nodule Repeat CT chest in 12 months  #Chronic kidney disease stage III Renal function seems to be at her baseline Avoid nephrotoxins and monitor renal function closely  #Essential hypertension Blood pressure is elevated IV Lopressor as needed Resume home medications after med reconciliation  #Generalized weakness-patient was able to walk in the room with a walker.  She has a walker at home and lives with her daughter so advised to go home and continue using the walker.  #Tobacco abuse disorder counseled patient to quit smoking for 5 minutes. She verbalized understanding will provide nicotine patch  #DVT  prophylaxis on Coumadin   DISCHARGE CONDITIONS:   Stable.  CONSULTS OBTAINED:    DRUG ALLERGIES:  No Known Allergies  DISCHARGE MEDICATIONS:   Allergies as of 01/05/2019   No Known Allergies     Medication List    TAKE these medications   Accu-Chek FastClix Lancets Misc   atorvastatin 10 MG tablet Commonly known as:  LIPITOR Take 1 tablet (10 mg total) by mouth every evening.   B-D SINGLE USE SWABS REGULAR Pads USE AS DIRECTED TWICE DAILY   BD Pen Needle Nano U/F 32G X 4 MM Misc Generic drug:  Insulin Pen Needle   citalopram 40 MG tablet Commonly known as:  CELEXA Take 1 tablet (40 mg total) by mouth daily.   digoxin 0.125 MG tablet Commonly known as:  LANOXIN TAKE 1 TABLET BY MOUTH ONCE DAILY AFTER FINISHING LOADING DOSE.   donepezil 5 MG tablet Commonly known as:  ARICEPT TAKE 1 TABLET BY MOUTH ONCE DAILY FOR MEMORY   furosemide 20 MG tablet Commonly known as:  LASIX TAKE 1 TABLET BY MOUTH ONCE DAILY   Accu-Chek Guide test strip Generic drug:  glucose blood   glucose blood test strip Use as instructed to check blood sugars twice a day.  E11.65   lidocaine 5 % Commonly known as:  Lidoderm Place 1 patch onto the skin every 12 (twelve) hours. Remove & Discard patch within 12 hours or as directed by MD   liraglutide 18 MG/3ML Sopn Commonly known as:  Victoza Inject 0.2 mLs (1.2 mg total) into the skin daily.   metoCLOPramide 5 MG tablet Commonly known as:  Reglan Take one tab po  bid for nausea and one at bed time prn   oseltamivir 30 MG capsule Commonly known as:  TAMIFLU Take 1 capsule (30 mg total) by mouth 2 (two) times daily for 3 days.   pantoprazole 40 MG tablet Commonly known as:  PROTONIX Take 1 tablet (40 mg total) by mouth 2 (two) times daily.   rOPINIRole 0.5 MG tablet Commonly known as:  REQUIP TAKE 1 TABLET BY MOUTH IN THE EVENING   tizanidine 2 MG capsule Commonly known as:  ZANAFLEX   Toujeo SoloStar 300 UNIT/ML  Sopn Generic drug:  Insulin Glargine Inject 20 mg into the skin daily.   Toujeo SoloStar 300 UNIT/ML Sopn Generic drug:  Insulin Glargine (1 Unit Dial) INJECT  16  TO  18 UNITS SUBCUTANEOUSLY AT BEDTIME  ( EXPIRES 42 DAYS AFTER OPENING  )   traMADol 50 MG tablet Commonly known as:  ULTRAM Take 1 tablet (50 mg total) by mouth 2 (two) times daily as needed. for pain   Vitamin D (Ergocalciferol) 1.25 MG (50000 UT) Caps capsule Commonly known as:  DRISDOL Take 1 capsule (50,000 Units total) by mouth every 7 (seven) days.   warfarin 4 MG tablet Commonly known as:  COUMADIN Take 1 tablet (4 mg total) by mouth See admin instructions. tk 1.5 tabs on Tuesday and Friday, and 1 tablet on the rest of the days        DISCHARGE INSTRUCTIONS:    Follow with primary care physician in 1 week.  If you experience worsening of your admission symptoms, develop shortness of breath, life threatening emergency, suicidal or homicidal thoughts you must seek medical attention immediately by calling 911 or calling your MD immediately  if symptoms less severe.  You Must read complete instructions/literature along with all the possible adverse reactions/side effects for all the Medicines you take and that have been prescribed to you. Take any new Medicines after you have completely understood and accept all the possible adverse reactions/side effects.   Please note  You were cared for by a hospitalist during your hospital stay. If you have any questions about your discharge medications or the care you received while you were in the hospital after you are discharged, you can call the unit and asked to speak with the hospitalist on call if the hospitalist that took care of you is not available. Once you are discharged, your primary care physician will handle any further medical issues. Please note that NO REFILLS for any discharge medications will be authorized once you are discharged, as it is imperative that  you return to your primary care physician (or establish a relationship with a primary care physician if you do not have one) for your aftercare needs so that they can reassess your need for medications and monitor your lab values.    Today   CHIEF COMPLAINT:   Chief Complaint  Patient presents with  . Weakness    HISTORY OF PRESENT ILLNESS:  [name]  is a 78 y.o. female with a known history of chronic atrial fibrillation on Coumadin, insulin requiring diabetes mellitus, chronic kidney disease stage III, GERD, hyperlipidemia, hypertension and other medical problems living at home with her daughter came into the ED with a chief complaint of generalized weakness.  Patient was having fever with chills for the past 24 hours and feeling extremely weak associated with a dry cough.  Denies any chest pain or shortness of breath.  Influenza test is positive for influenza A.  Hospitalist team is  called to admit the patient.  Patient reports extreme weakness associated with cough.  [name] at bedside.  VITAL SIGNS:  Blood pressure 127/81, pulse 70, temperature 98.3 F (36.8 C), temperature source Oral, resp. rate 18, height '4\' 9"'  (1.448 m), weight 53.9 kg, SpO2 100 %.  I/O:    Intake/Output Summary (Last 24 hours) at 01/05/2019 1350 Last data filed at 01/05/2019 0458 Gross per 24 hour  Intake 654.67 ml  Output 1700 ml  Net -1045.33 ml    PHYSICAL EXAMINATION:  GENERAL:  78 y.o.-year-old patient lying in the bed with no acute distress.  EYES: Pupils equal, round, reactive to light and accommodation. No scleral icterus. Extraocular muscles intact.  HEENT: Head atraumatic, normocephalic. Oropharynx and nasopharynx clear.  NECK:  Supple, no jugular venous distention. No thyroid enlargement, no tenderness.  LUNGS: Normal breath sounds bilaterally, no wheezing, rales,rhonchi or crepitation. No use of accessory muscles of respiration.  CARDIOVASCULAR: S1, S2 normal. No murmurs, rubs,  or gallops.  ABDOMEN: Soft, non-tender, non-distended. Bowel sounds present. No organomegaly or mass.  EXTREMITIES: No pedal edema, cyanosis, or clubbing.  NEUROLOGIC: Cranial nerves II through XII are intact. Muscle strength 5/5 in all extremities. Sensation intact. Gait not checked.  PSYCHIATRIC: The patient is alert and oriented x 3.  SKIN: No obvious rash, lesion, or ulcer.   DATA REVIEW:   CBC Recent Labs  Lab 01/04/19 0534  WBC 4.8  HGB 11.7*  HCT 35.4*  PLT 142*    Chemistries  Recent Labs  Lab 01/04/19 0534 01/05/19 0523  NA 137 139  K 4.2 4.0  CL 107 108  CO2 25 25  GLUCOSE 77 89  BUN 23 20  CREATININE 1.20* 1.25*  CALCIUM 8.2* 8.2*  AST 24  --   ALT 12  --   ALKPHOS 50  --   BILITOT 0.7  --     Cardiac Enzymes No results for input(s): TROPONINI in the last 168 hours.  Microbiology Results  Results for orders placed or performed during the hospital encounter of 01/03/19  Blood culture (routine x 2)     Status: None (Preliminary result)   Collection Time: 01/03/19 12:42 PM  Result Value Ref Range Status   Specimen Description BLOOD LFA  Final   Special Requests   Final    BOTTLES DRAWN AEROBIC AND ANAEROBIC Blood Culture adequate volume   Culture   Final    NO GROWTH 2 DAYS Performed at [facility], [address] [place], [place] [address]    Report Status PENDING  Incomplete  Blood culture (routine x 2)     Status: None (Preliminary result)   Collection Time: 01/03/19  5:12 PM  Result Value Ref Range Status   Specimen Description BLOOD BRA  Final   Special Requests BOTTLES DRAWN AEROBIC AND ANAEROBIC  Final   Culture   Final    NO GROWTH 2 DAYS Performed at [facility], [address], [place], [place] [address]    Report Status PENDING  Incomplete    RADIOLOGY:  Ct Chest Wo Contrast  Result Date: 01/03/2019 CLINICAL DATA:  78 year old female with weakness, cough, shortness of breath. Smoker. EXAM: CT CHEST WITHOUT  CONTRAST TECHNIQUE: Multidetector CT imaging of the chest was performed following the standard protocol without IV contrast. COMPARISON:  CT Abdomen and Pelvis 01/11/2018. Chest radiographs earlier today. FINDINGS: Cardiovascular: Calcified coronary artery atherosclerosis. Calcified aortic atherosclerosis. Vascular patency is not evaluated in the absence of IV contrast. Borderline to  mild cardiomegaly is stable since 2019. No pericardial effusion. Mediastinum/Nodes: Small reactive appearing mediastinal lymph nodes throughout. No enlarged or heterogeneous nodes. No thoracic inlet or axillary lymphadenopathy. Lungs/Pleura: Small volume dependent retained secretions in the trachea at the thoracic inlet. Major airways are otherwise patent. Chronic increased reticular opacity in the periphery of both lungs. Fibrotic changes appear most pronounced at the lung bases. No overt honeycombing. No superimposed consolidation, pleural effusion or inflammatory appearing pulmonary opacity. There is a small 4-5 millimeter lung nodule in the left upper lobe on series 3, image 43. Upper Abdomen: Postoperative changes to the right upper quadrant abdominal wall. Gallbladder appears to be surgically absent. Negative visible noncontrast liver, spleen, pancreas, adrenal glands, left kidney, and bowel. Musculoskeletal: No acute osseous abnormality identified. IMPRESSION: 1. Chronic lung disease suspected with peripheral fibrotic changes in both lungs. Small volume retained secretions in the trachea, but no definite superimposed acute pulmonary process. 2. Small 4-5 mm left upper lobe lung nodule. Non-contrast chest CT recommended in 12 months. This recommendation follows the consensus statement: Guidelines for Management of Incidental Pulmonary Nodules Detected on CT Images: From the Fleischner Society 2017; Radiology 2017; 284:228-243. 3. Calcified coronary artery and Aortic Atherosclerosis (ICD10-I70.0). Electronically Signed   By: Genevie Ann M.D.   On: 01/03/2019 15:24    EKG:   Orders placed or performed during the hospital encounter of 01/03/19  . ED EKG  . ED EKG      Management plans discussed with the patient, family and they are in agreement.  CODE STATUS:     Code Status Orders  (From admission, onward)         Start     Ordered   01/03/19 1619  Do not attempt resuscitation (DNR)  Continuous    Question Answer Comment  In the event of cardiac or respiratory ARREST Do not call a "code blue"   In the event of cardiac or respiratory ARREST Do not perform Intubation, CPR, defibrillation or ACLS   In the event of cardiac or respiratory ARREST Use medication by any route, position, wound care, and other measures to relive pain and suffering. May use oxygen, suction and manual treatment of airway obstruction as needed for comfort.   Comments RN may pronounce      01/03/19 1618        Code Status History    Date Active Date Inactive Code Status Order ID Comments User Context   01/12/2018 1214 01/15/2018 1831 DNR [id number]  Bettey Costa, MD Inpatient   01/12/2018 0210 01/12/2018 1214 Full Code [id number]  Lance Coon, MD Inpatient    Advance Directive Documentation     Most Recent Value  Type of Advance Directive  Healthcare Power of [place], Living will  Pre-existing out of facility DNR order (yellow form or pink MOST form)  -  "MOST" Form in Place?  -      TOTAL TIME TAKING CARE OF THIS PATIENT: 35 minutes.    Vaughan Basta M.D on 01/05/2019 at 1:50 PM  Between 7am to 6pm - Pager - [phone]  After 6pm go to [email or web address] - password [email or web address] [facility]  Office  [phone]  CC: Primary care physician; Lavera Guise, MD   Note: This dictation was prepared with Dragon dictation along with smaller phrase technology. Any transcriptional errors that result from this process are unintentional.

## 2019-01-05 NOTE — Progress Notes (Signed)
Initial Nutrition Assessment  DOCUMENTATION CODES:   Severe malnutrition in context of chronic illness  INTERVENTION:  Provide Glucerna Shake po TID, each supplement provides 220 kcal and 10 grams of protein. Patient prefers chocolate or butter pecan.  Encouraged adequate intake of protein at meals.  NUTRITION DIAGNOSIS:   Severe Malnutrition related to chronic illness(CHF, inadequate oral intake) as evidenced by severe fat depletion, severe muscle depletion.  GOAL:   Patient will meet greater than or equal to 90% of their needs  MONITOR:   PO intake, Supplement acceptance, Labs, Weight trends, I & O's  REASON FOR ASSESSMENT:   Malnutrition Screening Tool    ASSESSMENT:   78 year old female with PMHx of DM, GERD, HTN, HLD, A-fib, CHF admitted with sepsis from influenza A.   Met with patient at bedside. She reports she lives with her daughter at home. She tries to eat 3 meals per day and a snack, but her portion sizes have been very small for the past year. She reports she does not eat any meat at home and very little other sources of protein. She mainly has fruit and soup. She also reports her activity level started increasing over the past year. 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 She reports she has struggled with hypoglycemia lately at home. CBGs have fluctuated here. Her daughter gives patient her shots. Patient is amenable to drinking ONS to help meet calorie/protein needs. She is lactose-intolerant. Patient edentulous but reports she is used to eating without dentures.  Patient reports her UBW was 230 lbs a little over one year ago before her appetite decreased and activity level increased. Do not see any weights nearly that high in the chart, though weights only go back to 2018. She was 63.1 kg on 01/23/2018 and is now 53.9 kg. She has lost 9.2 kg (14.6% body weight) over the past 11 months, which is not significant for time  frame.  Medications reviewed and include: Colace 100 mg BID, Novolog 0-9 units TID, Novolog 0-5 units QHS, Lantus 6 units QHS, nicotine patch, Tamiflu, pantoprazole.   Labs reviewed: CBG 77-200, Creatinine 1.25. HgbA1c 6.1 on 3/14.  NUTRITION - FOCUSED PHYSICAL EXAM:    Most Recent Value  Orbital Region  Severe depletion  Upper Arm Region  Severe depletion  Thoracic and Lumbar Region  Severe depletion  Buccal Region  Severe depletion  Temple Region  Severe depletion  Clavicle Bone Region  Severe depletion  Clavicle and Acromion Bone Region  Severe depletion  Scapular Bone Region  Severe depletion  Dorsal Hand  Severe depletion  Patellar Region  Severe depletion  Anterior Thigh Region  Severe depletion  Posterior Calf Region  Severe depletion  Edema (RD Assessment)  None  Hair  Reviewed  Eyes  Reviewed  Mouth  Reviewed  Skin  Reviewed  Nails  Reviewed     Diet Order:   Diet Order            Diet - low sodium heart healthy        Diet heart healthy/carb modified Room service appropriate? Yes; Fluid consistency: Thin  Diet effective now             EDUCATION NEEDS:   Education needs have been addressed  Skin:  Skin Assessment: Reviewed RN Assessment  Last BM:  01/05/2019 - medium type 5  Height:   Ht Readings from Last 1 Encounters:  01/03/19 '4\' 9"'  (1.448 m)  Weight:   Wt Readings from Last 1 Encounters:  01/03/19 53.9 kg   Ideal Body Weight:  43.2 kg  BMI:  Body mass index is 25.71 kg/m.  Estimated Nutritional Needs:   Kcal:  1350-1550  Protein:  70-80 grams  Fluid:  1.3-1.5 L/day  Willey Blade, MS, RD, LDN Office: [phone] Pager: [phone] After Hours/Weekend Pager: [phone]

## 2019-01-05 NOTE — Progress Notes (Signed)
Pt ambulated in room with a walker with standby assist.  No hands on.

## 2019-01-05 NOTE — Progress Notes (Addendum)
[place] for Coumadin dosing Indication: atrial fibrillation  No Known Allergies  Patient Measurements: Height: 4\' 9"  (144.8 cm) Weight: 118 lb 13.3 oz (53.9 kg) IBW/kg (Calculated) : 38.6  Vital Signs: Temp: 98.1 F (36.7 C) (03/15 0503) Temp Source: Oral (03/15 0503) BP: 156/51 (03/15 0503) Pulse Rate: 63 (03/15 0503)  Labs: INR- 1.5 Recent Labs    01/03/19 1242 01/03/19 1602 01/04/19 0534 01/05/19 0523  HGB 12.4  --  11.7*  --   HCT 36.8  --  35.4*  --   PLT 163  --  142*  --   LABPROT  --  18.2* 16.5* 16.0*  INR  --  1.5* 1.4* 1.3*  CREATININE 1.42*  --  1.20* 1.25*    Estimated Creatinine Clearance: 26.6 mL/min (A) (by C-G formula based on SCr of 1.25 mg/dL (H)).   Medical History: Past Medical History:  Diagnosis Date  . Atrial fibrillation ([place])   . Congestive heart failure (CHF) ([place])   . Diabetes mellitus without complication ([place])   . GERD (gastroesophageal reflux disease)   . Hyperlipidemia   . Hypertension     Medications:  Scheduled:  . atorvastatin  10 mg Oral QPM  . citalopram  40 mg Oral Daily  . digoxin  0.125 mg Oral Daily  . docusate sodium  100 mg Oral BID  . donepezil  5 mg Oral QHS  . insulin aspart  0-5 Units Subcutaneous QHS  . insulin aspart  0-9 Units Subcutaneous TID WC  . insulin glargine  6 Units Subcutaneous QHS  . nicotine  21 mg Transdermal Daily  . oseltamivir  30 mg Oral BID  . pantoprazole  40 mg Oral BID  . rOPINIRole  0.5 mg Oral QPM  . Warfarin - Pharmacist Dosing Inpatient   Does not apply q1800    Assessment: 78 year old female with CC of weakness and fever. Influenza A positive. History of Atrial fibrillation treated with Coumadin at home. Home dose of Coumadin per home med list is 4mg  Monday, Wednesday, Thursday, Saturday and Sunday. 6mg  on Tuesday and Friday. Patient now with hematuria possibly from Coumadin.  3/13 INR 1.5  Warfarin 6 mg 3/14 INR 1.4   Not  given 3/15 INR 1.3   Goal of Therapy:  INR 2-3  Plan: Coumadin 6 mg x1 again tonight and follow up with INR/CBC in am. Drug interaction: Ropirinole    Doyal Saric A 01/05/2019,7:38 AM

## 2019-01-06 ENCOUNTER — Ambulatory Visit: Payer: Self-pay | Admitting: Adult Health

## 2019-01-07 ENCOUNTER — Encounter: Payer: Self-pay | Admitting: Adult Health

## 2019-01-07 ENCOUNTER — Ambulatory Visit (INDEPENDENT_AMBULATORY_CARE_PROVIDER_SITE_OTHER): Payer: Medicare HMO | Admitting: Adult Health

## 2019-01-07 ENCOUNTER — Other Ambulatory Visit: Payer: Self-pay | Admitting: Adult Health

## 2019-01-07 VITALS — BP 121/76 | HR 76 | Temp 99.1°F | Resp 16 | Ht <= 58 in | Wt 118.0 lb

## 2019-01-07 DIAGNOSIS — J101 Influenza due to other identified influenza virus with other respiratory manifestations: Secondary | ICD-10-CM

## 2019-01-07 DIAGNOSIS — K219 Gastro-esophageal reflux disease without esophagitis: Secondary | ICD-10-CM | POA: Diagnosis not present

## 2019-01-07 DIAGNOSIS — I10 Essential (primary) hypertension: Secondary | ICD-10-CM

## 2019-01-07 MED ORDER — DONEPEZIL HCL 5 MG PO TABS: ORAL_TABLET | ORAL | 3 refills | Status: DC

## 2019-01-07 MED ORDER — PANTOPRAZOLE SODIUM 40 MG PO TBEC: 40.0000 mg | DELAYED_RELEASE_TABLET | Freq: Two times a day (BID) | ORAL | 1 refills | Status: DC

## 2019-01-07 MED ORDER — ROPINIROLE HCL 0.5 MG PO TABS: 0.5000 mg | ORAL_TABLET | Freq: Every evening | ORAL | 1 refills | Status: DC

## 2019-01-07 MED ORDER — FUROSEMIDE 20 MG PO TABS: 20.0000 mg | ORAL_TABLET | Freq: Every day | ORAL | 1 refills | Status: DC

## 2019-01-07 MED ORDER — ATORVASTATIN CALCIUM 10 MG PO TABS: 10.0000 mg | ORAL_TABLET | Freq: Every evening | ORAL | 1 refills | Status: DC

## 2019-01-07 NOTE — Patient Instructions (Signed)

## 2019-01-07 NOTE — Progress Notes (Signed)
[facility] [place], [place] [address]  Internal MEDICINE  Office Visit Note  Patient Name: [name]  [id number]  [id number]  Date of Service: 01/07/2019  Chief Complaint  Patient presents with  . Influenza    hospital follow up     HPI Pt is here for hospital follow up.  She reports she went to the emergency room 4 days ago for weakness and not feeling well.  She tested positive for flu B.  She was kept overnight and given IV fluids and Tamiflu.  She reports she is feeling better at this time. She did get a flu shot this season.  Denis any new issues at this time.      Current Medication: Outpatient Encounter Medications as of 01/07/2019  Medication Sig  . ACCU-CHEK FASTCLIX LANCETS MISC   . Alcohol Swabs (B-D SINGLE USE SWABS REGULAR) PADS USE AS DIRECTED TWICE DAILY  . citalopram (CELEXA) 40 MG tablet Take 1 tablet (40 mg total) by mouth daily.  . digoxin (LANOXIN) 0.125 MG tablet TAKE 1 TABLET BY MOUTH ONCE DAILY AFTER FINISHING LOADING DOSE.  Marland Kitchen glucose blood (ACCU-CHEK GUIDE) test strip   . glucose blood test strip Use as instructed to check blood sugars twice a day.  E11.65  . Insulin Glargine 300 UNIT/ML SOPN Inject 14 Units into the skin at bedtime.  . Insulin Pen Needle (BD PEN NEEDLE NANO U/F) 32G X 4 MM MISC   . lidocaine (LIDODERM) 5 % Place 1 patch onto the skin every 12 (twelve) hours. Remove & Discard patch within 12 hours or as directed by MD  . liraglutide (VICTOZA) 18 MG/3ML SOPN Inject 0.2 mLs (1.2 mg total) into the skin daily.  . metoCLOPramide (REGLAN) 5 MG tablet Take one tab po bid for nausea and one at bed time prn  . tizanidine (ZANAFLEX) 2 MG capsule   . traMADol (ULTRAM) 50 MG tablet Take 1 tablet (50 mg total) by mouth 2 (two) times daily as needed. for pain  . Vitamin D, Ergocalciferol, (DRISDOL) 1.25 MG (50000 UT) CAPS capsule Take 1 capsule (50,000 Units total) by mouth every 7 (seven) days.  Marland Kitchen warfarin (COUMADIN) 4  MG tablet Take 1 tablet (4 mg total) by mouth See admin instructions. tk 1.5 tabs on Tuesday and Friday, and 1 tablet on the rest of the days  . [DISCONTINUED] atorvastatin (LIPITOR) 10 MG tablet Take 1 tablet (10 mg total) by mouth every evening.  . [DISCONTINUED] donepezil (ARICEPT) 5 MG tablet TAKE 1 TABLET BY MOUTH ONCE DAILY FOR MEMORY  . [DISCONTINUED] furosemide (LASIX) 20 MG tablet TAKE 1 TABLET BY MOUTH ONCE DAILY  . [DISCONTINUED] oseltamivir (TAMIFLU) 30 MG capsule Take 1 capsule (30 mg total) by mouth 2 (two) times daily for 3 days. (Patient not taking: Reported on 01/07/2019)  . [DISCONTINUED] pantoprazole (PROTONIX) 40 MG tablet Take 1 tablet (40 mg total) by mouth 2 (two) times daily.  . [DISCONTINUED] rOPINIRole (REQUIP) 0.5 MG tablet TAKE 1 TABLET BY MOUTH IN THE EVENING   No facility-administered encounter medications on file as of 01/07/2019.     Surgical History: Past Surgical History:  Procedure Laterality Date  . ABDOMINAL HYSTERECTOMY    . APPENDECTOMY    . CATARACT EXTRACTION    . CHOLECYSTECTOMY    . HERNIA REPAIR    . right knee replacement    . right nephroectomy      Medical History: Past Medical History:  Diagnosis Date  .  Atrial fibrillation ([place])   . Congestive heart failure (CHF) ([place])   . Diabetes mellitus without complication ([place])   . GERD (gastroesophageal reflux disease)   . Hyperlipidemia   . Hypertension     Family History: Family History  Problem Relation Age of Onset  . Breast cancer Mother     Social History   Socioeconomic History  . Marital status: Widowed    Spouse name: Not on file  . Number of children: Not on file  . Years of education: Not on file  . Highest education level: Not on file  Occupational History  . Not on file  Social Needs  . Financial resource strain: Not on file  . Food insecurity:    Worry: Not on file    Inability: Not on file  . Transportation needs:    Medical: Not on file    Non-medical: Not  on file  Tobacco Use  . Smoking status: Former Smoker    Packs/day: 1.00    Types: Cigarettes    Last attempt to quit: 01/03/2019    Years since quitting: 0.0  . Smokeless tobacco: Never Used  Substance and Sexual Activity  . Alcohol use: No  . Drug use: No  . Sexual activity: Not on file  Lifestyle  . Physical activity:    Days per week: Not on file    Minutes per session: Not on file  . Stress: Not on file  Relationships  . Social connections:    Talks on phone: Not on file    Gets together: Not on file    Attends religious service: Not on file    Active member of club or organization: Not on file    Attends meetings of clubs or organizations: Not on file    Relationship status: Not on file  . Intimate partner violence:    Fear of current or ex partner: Not on file    Emotionally abused: Not on file    Physically abused: Not on file    Forced sexual activity: Not on file  Other Topics Concern  . Not on file  Social History Narrative  . Not on file      Review of Systems  Constitutional: Negative for chills, fatigue and unexpected weight change.  HENT: Negative for congestion, rhinorrhea, sneezing and sore throat.   Eyes: Negative for photophobia, pain and redness.  Respiratory: Negative for cough, chest tightness and shortness of breath.   Cardiovascular: Negative for chest pain and palpitations.  Gastrointestinal: Negative for abdominal pain, constipation, diarrhea, nausea and vomiting.  Endocrine: Negative.   Genitourinary: Negative for dysuria and frequency.  Musculoskeletal: Negative for arthralgias, back pain, joint swelling and neck pain.  Skin: Negative for rash.  Allergic/Immunologic: Negative.   Neurological: Negative for tremors and numbness.  Hematological: Negative for adenopathy. Does not bruise/bleed easily.  Psychiatric/Behavioral: Negative for behavioral problems and sleep disturbance. The patient is not nervous/anxious.     Vital Signs: BP  121/76   Pulse 76   Temp 99.1 F (37.3 C)   Resp 16   Ht 4\' 9"  (1.448 m)   Wt 118 lb (53.5 kg)   SpO2 96%   BMI 25.53 kg/m    Physical Exam Vitals signs and nursing note reviewed.  Constitutional:      General: She is not in acute distress.    Appearance: She is well-developed. She is not diaphoretic.  HENT:     Head: Normocephalic and atraumatic.     Mouth/Throat:  Pharynx: No oropharyngeal exudate.  Eyes:     Pupils: Pupils are equal, round, and reactive to light.  Neck:     Musculoskeletal: Normal range of motion and neck supple.     Thyroid: No thyromegaly.     Vascular: No JVD.     Trachea: No tracheal deviation.  Cardiovascular:     Rate and Rhythm: Normal rate and regular rhythm.     Heart sounds: Normal heart sounds. No murmur. No friction rub. No gallop.   Pulmonary:     Effort: Pulmonary effort is normal. No respiratory distress.     Breath sounds: Normal breath sounds. No wheezing or rales.  Chest:     Chest wall: No tenderness.  Abdominal:     Palpations: Abdomen is soft.     Tenderness: There is no abdominal tenderness. There is no guarding.  Musculoskeletal: Normal range of motion.  Lymphadenopathy:     Cervical: No cervical adenopathy.  Skin:    General: Skin is warm and dry.  Neurological:     Mental Status: She is alert and oriented to person, place, and time.     Cranial Nerves: No cranial nerve deficit.  Psychiatric:        Behavior: Behavior normal.        Thought Content: Thought content normal.        Judgment: Judgment normal.     Assessment/Plan: 1. Influenza B Resolved at this time.  Pt continues to reports intermittent cough.  Mostly due to sinus drainage which is an ongoing problem. Instructed patient to RTC if any new symptoms develop.   2. Essential hypertension Stable, at this time.       3. Gastroesophageal reflux disease without esophagitis Stable, pt denies any symptoms.  Continue with current therapy.   General  Counseling: Kaedance verbalizes understanding of the findings of todays visit and agrees with plan of treatment. I have discussed any further diagnostic evaluation that may be needed or ordered today. We also reviewed her medications today. she has been encouraged to call the office with any questions or concerns that should arise related to todays visit.    No orders of the defined types were placed in this encounter.   No orders of the defined types were placed in this encounter.   Time spent: 25 Minutes   This patient was seen by Orson Gear AGNP-C in Collaboration with Dr Lavera Guise as a part of collaborative care agreement     Kendell Bane AGNP-C Internal medicine

## 2019-01-08 LAB — CULTURE, BLOOD (ROUTINE X 2)
Culture: NO GROWTH
Culture: NO GROWTH
Special Requests: ADEQUATE

## 2019-01-09 ENCOUNTER — Ambulatory Visit (INDEPENDENT_AMBULATORY_CARE_PROVIDER_SITE_OTHER): Payer: Medicare HMO

## 2019-01-09 DIAGNOSIS — Z7901 Long term (current) use of anticoagulants: Secondary | ICD-10-CM | POA: Diagnosis not present

## 2019-01-09 LAB — POCT INR: INR: 1.7 — AB (ref 2.0–3.0)

## 2019-01-13 ENCOUNTER — Ambulatory Visit (INDEPENDENT_AMBULATORY_CARE_PROVIDER_SITE_OTHER): Payer: Medicare HMO

## 2019-01-13 DIAGNOSIS — Z7901 Long term (current) use of anticoagulants: Secondary | ICD-10-CM

## 2019-01-13 LAB — POCT INR: INR: 2.7 (ref 2.0–3.0)

## 2019-01-22 ENCOUNTER — Ambulatory Visit (INDEPENDENT_AMBULATORY_CARE_PROVIDER_SITE_OTHER): Payer: Medicare HMO

## 2019-01-22 ENCOUNTER — Other Ambulatory Visit: Payer: Self-pay

## 2019-01-22 ENCOUNTER — Encounter: Payer: Self-pay | Admitting: Nurse Practitioner

## 2019-01-22 DIAGNOSIS — I480 Paroxysmal atrial fibrillation: Secondary | ICD-10-CM

## 2019-01-22 LAB — PROTIME-INR
INR: 1.7 — AB (ref 0.9–1.1)
INR: 1.7 — AB (ref 0.9–1.1)
INR: 2.7 — AB (ref 0.9–1.1)
INR: 2.9 — AB (ref 0.9–1.1)

## 2019-01-22 NOTE — Progress Notes (Signed)
Pt inr 1.7 continue same med  for now

## 2019-01-27 ENCOUNTER — Ambulatory Visit (INDEPENDENT_AMBULATORY_CARE_PROVIDER_SITE_OTHER): Payer: Medicare HMO

## 2019-01-27 ENCOUNTER — Other Ambulatory Visit: Payer: Self-pay

## 2019-01-27 DIAGNOSIS — Z7901 Long term (current) use of anticoagulants: Secondary | ICD-10-CM | POA: Diagnosis not present

## 2019-01-27 LAB — POCT INR: INR: 2.1 (ref 2.0–3.0)

## 2019-01-27 NOTE — Progress Notes (Signed)
Home inr 2.1  Per adam continue  With current medication

## 2019-02-04 ENCOUNTER — Ambulatory Visit (INDEPENDENT_AMBULATORY_CARE_PROVIDER_SITE_OTHER): Payer: Medicare HMO

## 2019-02-04 ENCOUNTER — Other Ambulatory Visit: Payer: Self-pay

## 2019-02-04 DIAGNOSIS — I480 Paroxysmal atrial fibrillation: Secondary | ICD-10-CM | POA: Diagnosis not present

## 2019-02-04 NOTE — Progress Notes (Signed)
Pt Inr 2.7 continue same med

## 2019-02-10 ENCOUNTER — Ambulatory Visit (INDEPENDENT_AMBULATORY_CARE_PROVIDER_SITE_OTHER): Payer: Medicare HMO

## 2019-02-10 ENCOUNTER — Other Ambulatory Visit: Payer: Self-pay

## 2019-02-10 DIAGNOSIS — I480 Paroxysmal atrial fibrillation: Secondary | ICD-10-CM

## 2019-02-10 NOTE — Progress Notes (Signed)
Pt INR 3.3  Continue same med

## 2019-02-12 ENCOUNTER — Encounter: Payer: Self-pay | Admitting: Adult Health

## 2019-02-12 ENCOUNTER — Other Ambulatory Visit: Payer: Self-pay | Admitting: Adult Health

## 2019-02-12 LAB — PROTIME-INR
INR: 1.7 — AB (ref 0.9–1.1)
INR: 2.1 — AB (ref 0.9–1.1)
INR: 2.7 — AB (ref 0.9–1.1)
INR: 3.3 — AB (ref 0.9–1.1)

## 2019-02-24 ENCOUNTER — Ambulatory Visit (INDEPENDENT_AMBULATORY_CARE_PROVIDER_SITE_OTHER): Payer: Medicare HMO

## 2019-02-24 ENCOUNTER — Other Ambulatory Visit: Payer: Self-pay

## 2019-02-24 DIAGNOSIS — Z7901 Long term (current) use of anticoagulants: Secondary | ICD-10-CM | POA: Diagnosis not present

## 2019-02-24 NOTE — Progress Notes (Signed)
Home inr was 3.0 continue with current medication .

## 2019-03-03 ENCOUNTER — Other Ambulatory Visit: Payer: Self-pay | Admitting: Adult Health

## 2019-03-03 DIAGNOSIS — Z7901 Long term (current) use of anticoagulants: Secondary | ICD-10-CM | POA: Diagnosis not present

## 2019-03-04 ENCOUNTER — Encounter: Payer: Self-pay | Admitting: Adult Health

## 2019-03-04 LAB — PROTIME-INR: INR: 3 — AB (ref 0.9–1.1)

## 2019-03-05 ENCOUNTER — Ambulatory Visit (INDEPENDENT_AMBULATORY_CARE_PROVIDER_SITE_OTHER): Payer: Medicare HMO

## 2019-03-05 ENCOUNTER — Other Ambulatory Visit: Payer: Self-pay

## 2019-03-05 DIAGNOSIS — I480 Paroxysmal atrial fibrillation: Secondary | ICD-10-CM | POA: Diagnosis not present

## 2019-03-05 NOTE — Progress Notes (Signed)
Pt inr 3.0 continue as med as prescribed

## 2019-03-10 ENCOUNTER — Other Ambulatory Visit: Payer: Self-pay

## 2019-03-10 ENCOUNTER — Ambulatory Visit (INDEPENDENT_AMBULATORY_CARE_PROVIDER_SITE_OTHER): Payer: Medicare HMO

## 2019-03-10 DIAGNOSIS — I480 Paroxysmal atrial fibrillation: Secondary | ICD-10-CM | POA: Diagnosis not present

## 2019-03-10 NOTE — Progress Notes (Signed)
Pt INR -2.1 as per dr Quita Skye continue same med

## 2019-03-18 ENCOUNTER — Ambulatory Visit: Payer: Self-pay | Admitting: Adult Health

## 2019-03-18 ENCOUNTER — Encounter: Payer: Self-pay | Admitting: Nurse Practitioner

## 2019-03-18 ENCOUNTER — Other Ambulatory Visit: Payer: Self-pay

## 2019-03-18 ENCOUNTER — Ambulatory Visit (INDEPENDENT_AMBULATORY_CARE_PROVIDER_SITE_OTHER): Payer: Medicare HMO | Admitting: Nurse Practitioner

## 2019-03-18 VITALS — Ht 60.0 in | Wt 135.0 lb

## 2019-03-18 DIAGNOSIS — G8929 Other chronic pain: Secondary | ICD-10-CM | POA: Diagnosis not present

## 2019-03-18 DIAGNOSIS — Z0001 Encounter for general adult medical examination with abnormal findings: Secondary | ICD-10-CM | POA: Diagnosis not present

## 2019-03-18 DIAGNOSIS — I10 Essential (primary) hypertension: Secondary | ICD-10-CM

## 2019-03-18 DIAGNOSIS — M25512 Pain in left shoulder: Secondary | ICD-10-CM

## 2019-03-18 DIAGNOSIS — I480 Paroxysmal atrial fibrillation: Secondary | ICD-10-CM | POA: Diagnosis not present

## 2019-03-18 DIAGNOSIS — E1122 Type 2 diabetes mellitus with diabetic chronic kidney disease: Secondary | ICD-10-CM | POA: Diagnosis not present

## 2019-03-18 DIAGNOSIS — N184 Chronic kidney disease, stage 4 (severe): Secondary | ICD-10-CM

## 2019-03-18 MED ORDER — ACETAMINOPHEN-CODEINE #3 300-30 MG PO TABS: 1.0000 | ORAL_TABLET | Freq: Four times a day (QID) | ORAL | 0 refills | Status: DC | PRN

## 2019-03-18 NOTE — Progress Notes (Signed)
[facility] [place],  [address]  Internal MEDICINE  Telephone Visit  Patient Name: [name]  [id number]  [id number]  Date of Service: 03/18/2019  I connected with the patient at 10:46am by telephone and verified the patients identity using two identifiers.   I discussed the limitations, risks, security and privacy concerns of performing an evaluation and management service by telephone and the availability of in person appointments. I also discussed with the patient that there may be a patient responsible charge related to the service.  The patient expressed understanding and agrees to proceed.    Chief Complaint  Patient presents with  . Telephone Screen    PHONE VISIT [phone]  . Telephone Assessment  . Medicare Wellness    pt stated that tramadol not working, arm pain  . Hypertension  . Hyperlipidemia  . Diabetes    The patient has been contacted via telephone for follow up visit due to concerns for spread of novel coronavirus. The patient's daughter states that she has left shoulder. Fell twice on this shoulder. Did go to ER. Had X-rays done which showed no acute abnormalities.. reviewing the x-ray from 10/2018, it did indicate calcific tendinitis and worsening degenerative changes in left shoulder .      Current Medication: Outpatient Encounter Medications as of 03/18/2019  Medication Sig  . ACCU-CHEK FASTCLIX LANCETS MISC   . acetaminophen-codeine (TYLENOL #3) 300-30 MG tablet Take 1 tablet by mouth every 6 (six) hours as needed for moderate pain.  . Alcohol Swabs (B-D SINGLE USE SWABS REGULAR) PADS USE AS DIRECTED TWICE DAILY  . atorvastatin (LIPITOR) 10 MG tablet Take 1 tablet (10 mg total) by mouth every evening.  . citalopram (CELEXA) 40 MG tablet Take 1 tablet (40 mg total) by mouth daily.  . digoxin (LANOXIN) 0.125 MG tablet TAKE 1 TABLET BY MOUTH ONCE DAILY AFTER FINISHING LOADING DOSE.  Marland Kitchen donepezil (ARICEPT) 5 MG tablet  TAKE 1 TABLET BY MOUTH ONCE DAILY FOR MEMORY  . furosemide (LASIX) 20 MG tablet Take 1 tablet (20 mg total) by mouth daily.  Marland Kitchen glimepiride (AMARYL) 2 MG tablet Take 2 mg by mouth daily with breakfast.  . glucose blood (ACCU-CHEK GUIDE) test strip   . glucose blood test strip Use as instructed to check blood sugars twice a day.  E11.65  . Insulin Glargine 300 UNIT/ML SOPN Inject 14 Units into the skin at bedtime.  . Insulin Pen Needle (BD PEN NEEDLE NANO U/F) 32G X 4 MM MISC   . lidocaine (LIDODERM) 5 % Place 1 patch onto the skin every 12 (twelve) hours. Remove & Discard patch within 12 hours or as directed by MD  . liraglutide (VICTOZA) 18 MG/3ML SOPN Inject 0.2 mLs (1.2 mg total) into the skin daily.  . metoCLOPramide (REGLAN) 5 MG tablet Take one tab po bid for nausea and one at bed time prn  . pantoprazole (PROTONIX) 40 MG tablet Take 1 tablet (40 mg total) by mouth 2 (two) times daily.  Marland Kitchen rOPINIRole (REQUIP) 0.5 MG tablet Take 1 tablet (0.5 mg total) by mouth every evening.  . tizanidine (ZANAFLEX) 2 MG capsule   . traMADol (ULTRAM) 50 MG tablet Take 1 tablet (50 mg total) by mouth 2 (two) times daily as needed. for pain  . Vitamin D, Ergocalciferol, (DRISDOL) 1.25 MG (50000 UT) CAPS capsule Take 1 capsule (50,000 Units total) by mouth every 7 (seven) days.  Marland Kitchen warfarin (COUMADIN) 4 MG tablet Take 1 tablet (4  mg total) by mouth See admin instructions. tk 1.5 tabs on Tuesday and Friday, and 1 tablet on the rest of the days  . [DISCONTINUED] Alcohol Swabs (B-D SINGLE USE SWABS REGULAR) PADS USE AS DIRECTED TWICE DAILY  . [DISCONTINUED] atorvastatin (LIPITOR) 10 MG tablet Take 1 tablet (10 mg total) by mouth every evening.  . [DISCONTINUED] digoxin (LANOXIN) 0.125 MG tablet Take 125 mcg by mouth daily.  . [DISCONTINUED] donepezil (ARICEPT) 5 MG tablet TAKE 1 TABLET BY MOUTH ONCE DAILY FOR MEMORY  . [DISCONTINUED] furosemide (LASIX) 20 MG tablet Take 1 tablet (20 mg total) by mouth daily.  .  [DISCONTINUED] glimepiride (AMARYL) 2 MG tablet Take 1 tab po daily with supper  . [DISCONTINUED] Insulin Glargine (TOUJEO SOLOSTAR) 300 UNIT/ML SOPN Inject 20 mg into the skin daily.  . [DISCONTINUED] pantoprazole (PROTONIX) 40 MG tablet Take 1 tablet (40 mg total) by mouth 2 (two) times daily.  . [DISCONTINUED] rOPINIRole (REQUIP) 0.5 MG tablet Take 1 tablet (0.5 mg total) by mouth every evening.  . [DISCONTINUED] TOUJEO SOLOSTAR 300 UNIT/ML SOPN INJECT  16  TO  18 UNITS SUBCUTANEOUSLY AT BEDTIME  ( EXPIRES 42 DAYS AFTER OPENING  )  . [DISCONTINUED] traMADol (ULTRAM) 50 MG tablet Take 1 tablet (50 mg total) by mouth 2 (two) times daily as needed. for pain   No facility-administered encounter medications on file as of 03/18/2019.     Surgical History: Past Surgical History:  Procedure Laterality Date  . ABDOMINAL HYSTERECTOMY    . APPENDECTOMY    . CATARACT EXTRACTION    . CHOLECYSTECTOMY    . HERNIA REPAIR    . right knee replacement    . right nephroectomy      Medical History: Past Medical History:  Diagnosis Date  . Atrial fibrillation ([place])   . Congestive heart failure (CHF) ([place])   . Diabetes mellitus without complication ([place])   . GERD (gastroesophageal reflux disease)   . Hyperlipidemia   . Hypertension     Family History: Family History  Problem Relation Age of Onset  . Breast cancer Mother     Social History   Socioeconomic History  . Marital status: Widowed    Spouse name: Not on file  . Number of children: Not on file  . Years of education: Not on file  . Highest education level: Not on file  Occupational History  . Not on file  Social Needs  . Financial resource strain: Not on file  . Food insecurity:    Worry: Not on file    Inability: Not on file  . Transportation needs:    Medical: Not on file    Non-medical: Not on file  Tobacco Use  . Smoking status: Current Every Day Smoker    Packs/day: 1.00    Types: Cigarettes    Last attempt to quit:  01/03/2019    Years since quitting: 0.2  . Smokeless tobacco: Never Used  Substance and Sexual Activity  . Alcohol use: No  . Drug use: No  . Sexual activity: Not on file  Lifestyle  . Physical activity:    Days per week: Not on file    Minutes per session: Not on file  . Stress: Not on file  Relationships  . Social connections:    Talks on phone: Not on file    Gets together: Not on file    Attends religious service: Not on file    Active member of club or organization: Not on file  Attends meetings of clubs or organizations: Not on file    Relationship status: Not on file  . Intimate partner violence:    Fear of current or ex partner: Not on file    Emotionally abused: Not on file    Physically abused: Not on file    Forced sexual activity: Not on file  Other Topics Concern  . Not on file  Social History Narrative  . Not on file      Review of Systems  Constitutional: Negative for chills, fatigue and unexpected weight change.  HENT: Negative for congestion, rhinorrhea, sneezing and sore throat.   Respiratory: Negative for cough, chest tightness and shortness of breath.   Cardiovascular: Negative for chest pain and palpitations.  Gastrointestinal: Negative for abdominal pain, constipation, diarrhea, nausea and vomiting.  Endocrine: Negative for cold intolerance, heat intolerance, polydipsia and polyuria.       Blood sugars doing well   Musculoskeletal: Positive for arthralgias and joint swelling. Negative for back pain and neck pain.       Left shoulder pain. Getting worse. Making it dificult for her to rest at night .  Skin: Negative for rash.  Allergic/Immunologic: Negative.   Neurological: Negative for tremors and numbness.  Hematological: Negative for adenopathy. Does not bruise/bleed easily.  Psychiatric/Behavioral: Negative for behavioral problems and sleep disturbance. The patient is not nervous/anxious.     Vital Signs: Ht 5' (1.524 m)   Wt 135 lb (61.2  kg)   BMI 26.37 kg/m    Observation/Objective:  Spoke mostly with patient's daughter on the phone. When speaking to the patient.  She is alert and oriented. She is pleasant and answers all questions appropriately. Breathing is non-labored. She is in no acute distress at this time.   Depression screen [facility] 2/9 03/18/2019 12/19/2018 11/25/2018 10/01/2018 07/01/2018  Decreased Interest 0 0 0 0 0  Down, Depressed, Hopeless 0 0 0 0 0  PHQ - 2 Score 0 0 0 0 0    Functional Status Survey:    MMSE - Mini Mental State Exam 03/13/2018  Orientation to time 5  Orientation to Place 5  Registration 3  Attention/ Calculation 5  Recall 3  Language- name 2 objects 2  Language- repeat 1  Language- follow 3 step command 3  Language- read & follow direction 1  Write a sentence 0  Copy design 1  Total score 29    Fall Risk  03/18/2019 01/07/2019 12/19/2018 11/25/2018 10/01/2018  Falls in the past year? 1 1 1 1  0  Number falls in past yr: 0 1 1 1  -  Injury with Fall? 0 - 0 1 -     Assessment/Plan: 1. Encounter for general adult medical examination with abnormal findings Annual health maintenance exam today.   2. Chronic left shoulder pain Getting worse. Will add tylenol #3, prescribed as TID prn pain, however, explained to patient's daughter she should take just at night as needed. Daughter and patient both voiced understanding. New rx for #30 tablets was sent to [facility] [place]. Refer to orthopedics for further evaluation and treatment.  - Ambulatory referral to Orthopedic Surgery - acetaminophen-codeine (TYLENOL #3) 300-30 MG tablet; Take 1 tablet by mouth every 6 (six) hours as needed for moderate pain.  Dispense: 30 tablet; Refill: 0  3. Essential hypertension Continue bp medication as prescribed   4. AF (paroxysmal atrial fibrillation) (HCC) Continue digoxin and warfarin as prescribed. INR checks weekly at home. Adjust warfarin dosing as indicated.   5.  Type 2 diabetes mellitus with  stage 4 chronic kidney disease, without long-term current use of insulin ([place]) Continue diabetic medication as prescribed.   General Counseling: Teresia verbalizes understanding of the findings of today's phone visit and agrees with plan of treatment. I have discussed any further diagnostic evaluation that may be needed or ordered today. We also reviewed her medications today. she has been encouraged to call the office with any questions or concerns that should arise related to todays visit.  Diabetes Counseling:  1. Addition of ACE inh/ ARB'S for nephroprotection. Microalbumin is updated  2. Diabetic foot care, prevention of complications. Podiatry consult 3. Exercise and lose weight.  4. Diabetic eye examination, Diabetic eye exam is updated  5. Monitor blood sugar closlely. nutrition counseling.  6. Sign and symptoms of hypoglycemia including shaking sweating,confusion and headaches.   This patient was seen by Leretha Pol FNP Collaboration with Dr Lavera Guise as a part of collaborative care agreement  Orders Placed This Encounter  Procedures  . Ambulatory referral to Orthopedic Surgery    Meds ordered this encounter  Medications  . acetaminophen-codeine (TYLENOL #3) 300-30 MG tablet    Sig: Take 1 tablet by mouth every 6 (six) hours as needed for moderate pain.    Dispense:  30 tablet    Refill:  0    Order Specific Question:   Supervising Provider    Answer:   Lavera Guise [[id number]]    Time spent: 30  Minutes - there was at least 15 minutes spent reviewing chart with the patient and her daughter.     Dr Lavera Guise Internal medicine

## 2019-03-19 ENCOUNTER — Encounter: Payer: Self-pay | Admitting: Internal Medicine

## 2019-03-19 LAB — PROTIME-INR
INR: 2.1 — AB (ref 0.9–1.1)
INR: 3 — AB (ref 0.9–1.1)

## 2019-03-21 DIAGNOSIS — S46012A Strain of muscle(s) and tendon(s) of the rotator cuff of left shoulder, initial encounter: Secondary | ICD-10-CM | POA: Diagnosis not present

## 2019-03-21 DIAGNOSIS — M7532 Calcific tendinitis of left shoulder: Secondary | ICD-10-CM | POA: Diagnosis not present

## 2019-03-28 ENCOUNTER — Other Ambulatory Visit: Payer: Self-pay

## 2019-03-28 ENCOUNTER — Ambulatory Visit (INDEPENDENT_AMBULATORY_CARE_PROVIDER_SITE_OTHER): Payer: Medicare HMO

## 2019-03-28 DIAGNOSIS — I4891 Unspecified atrial fibrillation: Secondary | ICD-10-CM

## 2019-03-28 DIAGNOSIS — Z7901 Long term (current) use of anticoagulants: Secondary | ICD-10-CM | POA: Diagnosis not present

## 2019-03-28 NOTE — Progress Notes (Addendum)
Pt inr 3.1 continue same med as pres

## 2019-03-31 ENCOUNTER — Other Ambulatory Visit: Payer: Self-pay | Admitting: Internal Medicine

## 2019-03-31 ENCOUNTER — Other Ambulatory Visit: Payer: Self-pay | Admitting: Adult Health

## 2019-04-02 ENCOUNTER — Encounter: Payer: Self-pay | Admitting: Nurse Practitioner

## 2019-04-02 LAB — PROTIME-INR: INR: 3.1 — AB (ref 0.9–1.1)

## 2019-04-03 ENCOUNTER — Ambulatory Visit (INDEPENDENT_AMBULATORY_CARE_PROVIDER_SITE_OTHER): Payer: Medicare HMO

## 2019-04-03 ENCOUNTER — Other Ambulatory Visit: Payer: Self-pay

## 2019-04-03 DIAGNOSIS — Z7901 Long term (current) use of anticoagulants: Secondary | ICD-10-CM

## 2019-04-03 DIAGNOSIS — I480 Paroxysmal atrial fibrillation: Secondary | ICD-10-CM

## 2019-04-03 NOTE — Progress Notes (Signed)
Pt inr 2.2 continue same med

## 2019-04-08 ENCOUNTER — Other Ambulatory Visit: Payer: Self-pay | Admitting: Adult Health

## 2019-04-08 ENCOUNTER — Encounter: Payer: Self-pay | Admitting: Internal Medicine

## 2019-04-08 LAB — PROTIME-INR: INR: 2.2 — AB (ref 0.9–1.1)

## 2019-04-08 MED ORDER — WARFARIN SODIUM 4 MG PO TABS: ORAL_TABLET | ORAL | 3 refills | Status: DC

## 2019-04-11 ENCOUNTER — Other Ambulatory Visit: Payer: Self-pay | Admitting: Internal Medicine

## 2019-04-11 DIAGNOSIS — E11649 Type 2 diabetes mellitus with hypoglycemia without coma: Secondary | ICD-10-CM

## 2019-04-14 ENCOUNTER — Other Ambulatory Visit: Payer: Self-pay

## 2019-04-14 ENCOUNTER — Ambulatory Visit (INDEPENDENT_AMBULATORY_CARE_PROVIDER_SITE_OTHER): Payer: Medicare HMO

## 2019-04-14 DIAGNOSIS — I4891 Unspecified atrial fibrillation: Secondary | ICD-10-CM

## 2019-04-14 DIAGNOSIS — Z7901 Long term (current) use of anticoagulants: Secondary | ICD-10-CM | POA: Diagnosis not present

## 2019-04-14 NOTE — Progress Notes (Signed)
Pt Inr 1.4 as per adam advised pt daughter  take extra coumadin 4 mg with regular taking 1.5 and go back to regular coumadin 4 mg take 1.5 tab po daily

## 2019-04-24 ENCOUNTER — Encounter: Payer: Self-pay | Admitting: Internal Medicine

## 2019-04-24 ENCOUNTER — Ambulatory Visit (INDEPENDENT_AMBULATORY_CARE_PROVIDER_SITE_OTHER): Payer: Medicare HMO | Admitting: Internal Medicine

## 2019-04-24 ENCOUNTER — Other Ambulatory Visit: Payer: Self-pay

## 2019-04-24 VITALS — BP 98/58 | HR 76 | Temp 95.5°F | Resp 14 | Ht <= 58 in

## 2019-04-24 DIAGNOSIS — J302 Other seasonal allergic rhinitis: Secondary | ICD-10-CM | POA: Diagnosis not present

## 2019-04-24 DIAGNOSIS — H6692 Otitis media, unspecified, left ear: Secondary | ICD-10-CM

## 2019-04-24 MED ORDER — LORATADINE 10 MG PO TABS: 10.0000 mg | ORAL_TABLET | Freq: Every day | ORAL | 6 refills | Status: DC

## 2019-04-24 MED ORDER — FLUTICASONE PROPIONATE 50 MCG/ACT NA SUSP: 2.0000 | Freq: Every day | NASAL | 6 refills | Status: DC

## 2019-04-24 MED ORDER — AMOXICILLIN-POT CLAVULANATE 875-125 MG PO TABS: 1.0000 | ORAL_TABLET | Freq: Two times a day (BID) | ORAL | 0 refills | Status: DC

## 2019-04-24 NOTE — Progress Notes (Signed)
[facility] [address], [place] [address]  Internal MEDICINE  Telephone Visit  Patient Name: [name]  [id number]  [id number]  Date of Service: 05/08/2019  I connected with the patient at 11:00 by telephone and verified the patients identity using two identifiers.   I discussed the limitations, risks, security and privacy concerns of performing an evaluation and management service by telephone and the availability of in person appointments. I also discussed with the patient that there may be a patient responsible charge related to the service.  The patient expressed understanding and agrees to proceed.    Chief Complaint  Patient presents with  . Telephone Assessment  . Telephone Screen  . Sore Throat  . Ear Pain    right   . Headache    right     HPI Pt is c/o sore throat. Right ear pain and sinus congestion, denies any fever or chills. No fatigue, pressure in the sinuses, denies any exposure to Covid  Current Medication: Outpatient Encounter Medications as of 04/24/2019  Medication Sig  . ACCU-CHEK FASTCLIX LANCETS MISC   . acetaminophen-codeine (TYLENOL #3) 300-30 MG tablet Take 1 tablet by mouth every 6 (six) hours as needed for moderate pain.  . Alcohol Swabs (B-D SINGLE USE SWABS REGULAR) PADS USE AS DIRECTED TWICE DAILY  . atorvastatin (LIPITOR) 10 MG tablet Take 1 tablet (10 mg total) by mouth every evening.  . citalopram (CELEXA) 40 MG tablet Take 1 tablet by mouth once daily  . digoxin (LANOXIN) 0.125 MG tablet TAKE 1 TABLET BY MOUTH ONCE DAILY AFTER FINISHING LOADING DOSE.  Marland Kitchen donepezil (ARICEPT) 5 MG tablet TAKE 1 TABLET BY MOUTH ONCE DAILY FOR MEMORY  . furosemide (LASIX) 20 MG tablet Take 1 tablet (20 mg total) by mouth daily.  Marland Kitchen glimepiride (AMARYL) 2 MG tablet Take 2 mg by mouth daily with breakfast.  . glucose blood (ACCU-CHEK GUIDE) test strip   . glucose blood test strip Use as instructed to check blood sugars twice a day.  E11.65  .  Insulin Glargine 300 UNIT/ML SOPN Inject 14 Units into the skin at bedtime.  . Insulin Pen Needle (BD PEN NEEDLE NANO U/F) 32G X 4 MM MISC   . lidocaine (LIDODERM) 5 % Place 1 patch onto the skin every 12 (twelve) hours. Remove & Discard patch within 12 hours or as directed by MD  . metoCLOPramide (REGLAN) 5 MG tablet Take one tab po bid for nausea and one at bed time prn  . pantoprazole (PROTONIX) 40 MG tablet Take 1 tablet (40 mg total) by mouth 2 (two) times daily.  Marland Kitchen rOPINIRole (REQUIP) 0.5 MG tablet Take 1 tablet (0.5 mg total) by mouth every evening.  . tizanidine (ZANAFLEX) 2 MG capsule   . VICTOZA 18 MG/3ML SOPN INJECT 1.2 MG INTO THE SKIN DAILY AS DIRECTED   . Vitamin D, Ergocalciferol, (DRISDOL) 1.25 MG (50000 UT) CAPS capsule Take 1 capsule by mouth once a week  . warfarin (COUMADIN) 4 MG tablet Take 1 and 1/2 tab po daily  . [DISCONTINUED] traMADol (ULTRAM) 50 MG tablet Take 1 tablet (50 mg total) by mouth 2 (two) times daily as needed. for pain  . amoxicillin-clavulanate (AUGMENTIN) 875-125 MG tablet Take 1 tablet by mouth 2 (two) times daily. (Patient not taking: Reported on 05/02/2019)  . fluticasone (FLONASE) 50 MCG/ACT nasal spray Place 2 sprays into both nostrils daily.  Marland Kitchen loratadine (CLARITIN) 10 MG tablet Take 1 tablet (10 mg total) by  mouth daily.   No facility-administered encounter medications on file as of 04/24/2019.     Surgical History: Past Surgical History:  Procedure Laterality Date  . ABDOMINAL HYSTERECTOMY    . APPENDECTOMY    . CATARACT EXTRACTION    . CHOLECYSTECTOMY    . HERNIA REPAIR    . right knee replacement    . right nephroectomy      Medical History: Past Medical History:  Diagnosis Date  . Atrial fibrillation ([place])   . Congestive heart failure (CHF) ([place])   . Diabetes mellitus without complication ([place])   . GERD (gastroesophageal reflux disease)   . Hyperlipidemia   . Hypertension     Family History: Family History  Problem Relation  Age of Onset  . Breast cancer Mother     Social History   Socioeconomic History  . Marital status: Widowed    Spouse name: Not on file  . Number of children: Not on file  . Years of education: Not on file  . Highest education level: Not on file  Occupational History  . Not on file  Social Needs  . Financial resource strain: Not on file  . Food insecurity    Worry: Not on file    Inability: Not on file  . Transportation needs    Medical: Not on file    Non-medical: Not on file  Tobacco Use  . Smoking status: Current Every Day Smoker    Packs/day: 1.00    Types: Cigarettes    Last attempt to quit: 01/03/2019    Years since quitting: 0.3  . Smokeless tobacco: Never Used  Substance and Sexual Activity  . Alcohol use: No  . Drug use: No  . Sexual activity: Not on file  Lifestyle  . Physical activity    Days per week: Not on file    Minutes per session: Not on file  . Stress: Not on file  Relationships  . Social Herbalist on phone: Not on file    Gets together: Not on file    Attends religious service: Not on file    Active member of club or organization: Not on file    Attends meetings of clubs or organizations: Not on file    Relationship status: Not on file  . Intimate partner violence    Fear of current or ex partner: Not on file    Emotionally abused: Not on file    Physically abused: Not on file    Forced sexual activity: Not on file  Other Topics Concern  . Not on file  Social History Narrative  . Not on file   Review of Systems  Constitutional: Negative for fatigue and fever.  HENT: Positive for ear pain and sinus pain. Negative for postnasal drip.   Respiratory: Negative.   Cardiovascular: Negative.   Gastrointestinal: Diarrhea: llergic rhi.  Allergic/Immunologic: Negative.    Vital Signs: BP (!) 98/58   Pulse 76   Temp (!) 95.5 F (35.3 C)   Resp 14   Ht 4\' 9"  (1.448 m)   SpO2 94%   BMI 29.21 kg/m   Observation/Objective: Pt is  connected via telephone, NAD, able to communicate well  Diagnosis is limited due to lack of exam   Assessment/Plan: 1. Seasonal allergic rhinitis, unspecified trigger Start fluticasone (FLONASE) 50 MCG/ACT nasal spray; Place 2 sprays into both nostrils daily.  Dispense: 16 g; Refill: 6 Start loratadine (CLARITIN) 10 MG tablet; Take 1 tablet (10 mg total)  by mouth daily.  Dispense: 30 tablet; Refill: 6  2. Left otitis media, unspecified otitis media type  Start amoxicillin-clavulanate (AUGMENTIN) 875-125 MG tablet; Take 1 tablet by mouth 2 (two) times daily. (Patient not taking: Reported on 05/02/2019)  Dispense: 14 tablet; Refill: 0  General Counseling: Keyanah verbalizes understanding of the findings of today's phone visit and agrees with plan of treatment. I have discussed any further diagnostic evaluation that may be needed or ordered today. We also reviewed her medications today. she has been encouraged to call the office with any questions or concerns that should arise related to todays visit.   Meds ordered this encounter  Medications  . amoxicillin-clavulanate (AUGMENTIN) 875-125 MG tablet    Sig: Take 1 tablet by mouth 2 (two) times daily.    Dispense:  14 tablet    Refill:  0  . fluticasone (FLONASE) 50 MCG/ACT nasal spray    Sig: Place 2 sprays into both nostrils daily.    Dispense:  16 g    Refill:  6  . loratadine (CLARITIN) 10 MG tablet    Sig: Take 1 tablet (10 mg total) by mouth daily.    Dispense:  30 tablet    Refill:  6    Time spent:12 Minutes    Dr Lavera Guise Internal medicine

## 2019-05-02 ENCOUNTER — Ambulatory Visit (INDEPENDENT_AMBULATORY_CARE_PROVIDER_SITE_OTHER): Payer: Medicare HMO | Admitting: Nurse Practitioner

## 2019-05-02 ENCOUNTER — Other Ambulatory Visit: Payer: Self-pay

## 2019-05-02 ENCOUNTER — Encounter: Payer: Self-pay | Admitting: Nurse Practitioner

## 2019-05-02 VITALS — BP 130/38 | Ht <= 58 in | Wt 116.0 lb

## 2019-05-02 DIAGNOSIS — H6690 Otitis media, unspecified, unspecified ear: Secondary | ICD-10-CM | POA: Diagnosis not present

## 2019-05-02 DIAGNOSIS — M25512 Pain in left shoulder: Secondary | ICD-10-CM

## 2019-05-02 DIAGNOSIS — J0141 Acute recurrent pansinusitis: Secondary | ICD-10-CM

## 2019-05-02 MED ORDER — SULFAMETHOXAZOLE-TRIMETHOPRIM 800-160 MG PO TABS: 1.0000 | ORAL_TABLET | Freq: Two times a day (BID) | ORAL | 0 refills | Status: DC

## 2019-05-02 MED ORDER — TRAMADOL HCL 50 MG PO TABS: 50.0000 mg | ORAL_TABLET | Freq: Two times a day (BID) | ORAL | 0 refills | Status: DC | PRN

## 2019-05-02 MED ORDER — CIPRODEX 0.3-0.1 % OT SUSP: 4.0000 [drp] | Freq: Two times a day (BID) | OTIC | 0 refills | Status: DC

## 2019-05-02 NOTE — Progress Notes (Signed)
[facility] [address], [place] [address]  Internal MEDICINE  Telephone Visit  Patient Name: [name]  [id number]  [id number]  Date of Service: 05/11/2019  I connected with the patient at 3:53pm by webcam and verified the patients identity using two identifiers.   I discussed the limitations, risks, security and privacy concerns of performing an evaluation and management service by webcam and the availability of in person appointments. I also discussed with the patient that there may be a patient responsible charge related to the service.  The patient expressed understanding and agrees to proceed.    Chief Complaint  Patient presents with  . Telephone Screen    VIDEO VISIT [phone]  . Telephone Assessment  . Pain    ear still hurting 2wks later, pt completed her antibiotics still taking claritin    The patient has been contacted via webcam for follow up visit due to concerns for spread of novel coronavirus. The patient is having right ear pain which radiates into the right side of her neck and into the lright jaw and right side of the neck. She has congestion and headache. She feesl swollen lymph nodes in right side of the neck. Denies fever. Feels fatigued and has some body aches. She denies exposure to COVID 19.       Current Medication: Outpatient Encounter Medications as of 05/02/2019  Medication Sig  . ACCU-CHEK FASTCLIX LANCETS MISC   . acetaminophen-codeine (TYLENOL #3) 300-30 MG tablet Take 1 tablet by mouth every 6 (six) hours as needed for moderate pain.  . Alcohol Swabs (B-D SINGLE USE SWABS REGULAR) PADS USE AS DIRECTED TWICE DAILY  . atorvastatin (LIPITOR) 10 MG tablet Take 1 tablet (10 mg total) by mouth every evening.  . citalopram (CELEXA) 40 MG tablet Take 1 tablet by mouth once daily  . digoxin (LANOXIN) 0.125 MG tablet TAKE 1 TABLET BY MOUTH ONCE DAILY AFTER FINISHING LOADING DOSE.  Marland Kitchen donepezil (ARICEPT) 5 MG tablet TAKE 1 TABLET  BY MOUTH ONCE DAILY FOR MEMORY  . fluticasone (FLONASE) 50 MCG/ACT nasal spray Place 2 sprays into both nostrils daily.  . furosemide (LASIX) 20 MG tablet Take 1 tablet (20 mg total) by mouth daily.  Marland Kitchen glimepiride (AMARYL) 2 MG tablet Take 2 mg by mouth daily with breakfast.  . glucose blood (ACCU-CHEK GUIDE) test strip   . glucose blood test strip Use as instructed to check blood sugars twice a day.  E11.65  . Insulin Glargine 300 UNIT/ML SOPN Inject 14 Units into the skin at bedtime.  . Insulin Pen Needle (BD PEN NEEDLE NANO U/F) 32G X 4 MM MISC   . lidocaine (LIDODERM) 5 % Place 1 patch onto the skin every 12 (twelve) hours. Remove & Discard patch within 12 hours or as directed by MD  . loratadine (CLARITIN) 10 MG tablet Take 1 tablet (10 mg total) by mouth daily.  . metoCLOPramide (REGLAN) 5 MG tablet Take one tab po bid for nausea and one at bed time prn  . pantoprazole (PROTONIX) 40 MG tablet Take 1 tablet (40 mg total) by mouth 2 (two) times daily.  Marland Kitchen rOPINIRole (REQUIP) 0.5 MG tablet Take 1 tablet (0.5 mg total) by mouth every evening.  . tizanidine (ZANAFLEX) 2 MG capsule   . traMADol (ULTRAM) 50 MG tablet Take 1 tablet (50 mg total) by mouth 2 (two) times daily as needed. for pain  . VICTOZA 18 MG/3ML SOPN INJECT 1.2 MG INTO THE SKIN DAILY AS  DIRECTED   . Vitamin D, Ergocalciferol, (DRISDOL) 1.25 MG (50000 UT) CAPS capsule Take 1 capsule by mouth once a week  . warfarin (COUMADIN) 4 MG tablet Take 1 and 1/2 tab po daily  . [DISCONTINUED] traMADol (ULTRAM) 50 MG tablet Take 1 tablet (50 mg total) by mouth 2 (two) times daily as needed. for pain  . amoxicillin-clavulanate (AUGMENTIN) 875-125 MG tablet Take 1 tablet by mouth 2 (two) times daily. (Patient not taking: Reported on 05/02/2019)  . ciprofloxacin-dexamethasone (CIPRODEX) OTIC suspension Place 4 drops into the right ear 2 (two) times daily.  Marland Kitchen sulfamethoxazole-trimethoprim (BACTRIM DS) 800-160 MG tablet Take 1 tablet by mouth 2  (two) times daily.   No facility-administered encounter medications on file as of 05/02/2019.     Surgical History: Past Surgical History:  Procedure Laterality Date  . ABDOMINAL HYSTERECTOMY    . APPENDECTOMY    . CATARACT EXTRACTION    . CHOLECYSTECTOMY    . HERNIA REPAIR    . right knee replacement    . right nephroectomy      Medical History: Past Medical History:  Diagnosis Date  . Atrial fibrillation ([place])   . Congestive heart failure (CHF) ([place])   . Diabetes mellitus without complication ([place])   . GERD (gastroesophageal reflux disease)   . Hyperlipidemia   . Hypertension     Family History: Family History  Problem Relation Age of Onset  . Breast cancer Mother     Social History   Socioeconomic History  . Marital status: Widowed    Spouse name: Not on file  . Number of children: Not on file  . Years of education: Not on file  . Highest education level: Not on file  Occupational History  . Not on file  Social Needs  . Financial resource strain: Not on file  . Food insecurity    Worry: Not on file    Inability: Not on file  . Transportation needs    Medical: Not on file    Non-medical: Not on file  Tobacco Use  . Smoking status: Current Every Day Smoker    Packs/day: 1.00    Types: Cigarettes    Last attempt to quit: 01/03/2019    Years since quitting: 0.3  . Smokeless tobacco: Never Used  Substance and Sexual Activity  . Alcohol use: No  . Drug use: No  . Sexual activity: Not on file  Lifestyle  . Physical activity    Days per week: Not on file    Minutes per session: Not on file  . Stress: Not on file  Relationships  . Social Herbalist on phone: Not on file    Gets together: Not on file    Attends religious service: Not on file    Active member of club or organization: Not on file    Attends meetings of clubs or organizations: Not on file    Relationship status: Not on file  . Intimate partner violence    Fear of current or  ex partner: Not on file    Emotionally abused: Not on file    Physically abused: Not on file    Forced sexual activity: Not on file  Other Topics Concern  . Not on file  Social History Narrative  . Not on file      Review of Systems  Constitutional: Positive for chills and fatigue.  HENT: Positive for congestion, ear pain, postnasal drip, rhinorrhea, sinus pain, sore throat and voice  change.   Respiratory: Positive for cough and wheezing.   Cardiovascular: Negative for chest pain and palpitations.  Gastrointestinal: Positive for nausea.  Musculoskeletal: Positive for myalgias.  Allergic/Immunologic: Positive for environmental allergies.  Neurological: Positive for headaches.  Hematological: Positive for adenopathy.    Today's Vitals   05/02/19 1518  BP: (!) 130/38  Weight: 116 lb (52.6 kg)  Height: 4\' 10"  (1.473 m)   Body mass index is 24.24 kg/m.  Observation/Objective:    The patient is alert and oriented. She is pleasant and answers all questions appropriately. Breathing is non-labored. She is in no acute distress at this time.  She appears fatigued and moderately ill.    Assessment/Plan: 1. Acute recurrent pansinusitis Start round bactrim ds bid for 10 days. Rest and increase fluids. May use OTC medication to improve acute symptoms. - sulfamethoxazole-trimethoprim (BACTRIM DS) 800-160 MG tablet; Take 1 tablet by mouth 2 (two) times daily.  Dispense: 20 tablet; Refill: 0  2. Acute otitis media, unspecified otitis media type Will also add ciprodex ear drops. Insert four drops in both ears twice daily.  - sulfamethoxazole-trimethoprim (BACTRIM DS) 800-160 MG tablet; Take 1 tablet by mouth 2 (two) times daily.  Dispense: 20 tablet; Refill: 0 - ciprofloxacin-dexamethasone (CIPRODEX) OTIC suspension; Place 4 drops into the right ear 2 (two) times daily.  Dispense: 7.5 mL; Refill: 0  3. Acute pain of left shoulder May take tramadol 50mg  up to twice daily if needed for  pain. A new prescription was sent to her pharmacy today.  - traMADol (ULTRAM) 50 MG tablet; Take 1 tablet (50 mg total) by mouth 2 (two) times daily as needed. for pain  Dispense: 45 tablet; Refill: 0  General Counseling: Regine verbalizes understanding of the findings of today's phone visit and agrees with plan of treatment. I have discussed any further diagnostic evaluation that may be needed or ordered today. We also reviewed her medications today. she has been encouraged to call the office with any questions or concerns that should arise related to todays visit.  Rest and increase fluids. Continue using OTC medication to control symptoms.   This patient was seen by [place] with Dr Lavera Guise as a part of collaborative care agreement  Meds ordered this encounter  Medications  . sulfamethoxazole-trimethoprim (BACTRIM DS) 800-160 MG tablet    Sig: Take 1 tablet by mouth 2 (two) times daily.    Dispense:  20 tablet    Refill:  0    Order Specific Question:   Supervising Provider    Answer:   Lavera Guise [[id number]]  . ciprofloxacin-dexamethasone (CIPRODEX) OTIC suspension    Sig: Place 4 drops into the right ear 2 (two) times daily.    Dispense:  7.5 mL    Refill:  0    Order Specific Question:   Supervising Provider    Answer:   Lavera Guise [[id number]]  . traMADol (ULTRAM) 50 MG tablet    Sig: Take 1 tablet (50 mg total) by mouth 2 (two) times daily as needed. for pain    Dispense:  45 tablet    Refill:  0    Order Specific Question:   Supervising Provider    Answer:   Lavera Guise [[id number]]    Time spent: 56 Minutes    Dr Lavera Guise Internal medicine

## 2019-05-05 ENCOUNTER — Ambulatory Visit (INDEPENDENT_AMBULATORY_CARE_PROVIDER_SITE_OTHER): Payer: Medicare HMO

## 2019-05-05 ENCOUNTER — Other Ambulatory Visit: Payer: Self-pay

## 2019-05-05 DIAGNOSIS — I4891 Unspecified atrial fibrillation: Secondary | ICD-10-CM

## 2019-05-05 DIAGNOSIS — Z7901 Long term (current) use of anticoagulants: Secondary | ICD-10-CM | POA: Diagnosis not present

## 2019-05-05 NOTE — Progress Notes (Signed)
Pt INR 3.9 as per adam advised pt daughter  skip today coumadin and go back on same as prescribe

## 2019-05-11 DIAGNOSIS — J0141 Acute recurrent pansinusitis: Secondary | ICD-10-CM | POA: Insufficient documentation

## 2019-05-11 DIAGNOSIS — H6690 Otitis media, unspecified, unspecified ear: Secondary | ICD-10-CM | POA: Insufficient documentation

## 2019-05-11 DIAGNOSIS — M25512 Pain in left shoulder: Secondary | ICD-10-CM | POA: Insufficient documentation

## 2019-05-13 ENCOUNTER — Other Ambulatory Visit: Payer: Self-pay

## 2019-05-13 MED ORDER — BD PEN NEEDLE NANO U/F 32G X 4 MM MISC: 1 refills | Status: DC

## 2019-05-20 ENCOUNTER — Ambulatory Visit (INDEPENDENT_AMBULATORY_CARE_PROVIDER_SITE_OTHER): Payer: Medicare HMO | Admitting: Nurse Practitioner

## 2019-05-20 ENCOUNTER — Other Ambulatory Visit: Payer: Self-pay

## 2019-05-20 ENCOUNTER — Encounter: Payer: Self-pay | Admitting: Nurse Practitioner

## 2019-05-20 VITALS — BP 120/78 | HR 100 | Resp 16 | Ht <= 58 in | Wt 114.0 lb

## 2019-05-20 DIAGNOSIS — I10 Essential (primary) hypertension: Secondary | ICD-10-CM

## 2019-05-20 DIAGNOSIS — E1165 Type 2 diabetes mellitus with hyperglycemia: Secondary | ICD-10-CM | POA: Insufficient documentation

## 2019-05-20 DIAGNOSIS — I480 Paroxysmal atrial fibrillation: Secondary | ICD-10-CM

## 2019-05-20 LAB — POCT GLYCOSYLATED HEMOGLOBIN (HGB A1C): Hemoglobin A1C: 6.4 % — AB (ref 4.0–5.6)

## 2019-05-20 NOTE — Progress Notes (Signed)
[facility] [place], [place] [address]  Internal MEDICINE  Office Visit Note  Patient Name: [name]  [id number]  [id number]  Date of Service: 05/20/2019  No chief complaint on file.   The patient is here for routine follow up visit. Blood sugars are well controlled. HgbA1c is 6.4 today. Blood pressure looks good. The patient has recovered from recent sinus infection. She is due to have diabetic eye exam.       Current Medication: Outpatient Encounter Medications as of 05/20/2019  Medication Sig  . ACCU-CHEK FASTCLIX LANCETS MISC   . acetaminophen-codeine (TYLENOL #3) 300-30 MG tablet Take 1 tablet by mouth every 6 (six) hours as needed for moderate pain.  . Alcohol Swabs (B-D SINGLE USE SWABS REGULAR) PADS USE AS DIRECTED TWICE DAILY  . atorvastatin (LIPITOR) 10 MG tablet Take 1 tablet (10 mg total) by mouth every evening.  . citalopram (CELEXA) 40 MG tablet Take 1 tablet by mouth once daily  . digoxin (LANOXIN) 0.125 MG tablet TAKE 1 TABLET BY MOUTH ONCE DAILY AFTER FINISHING LOADING DOSE.  Marland Kitchen donepezil (ARICEPT) 5 MG tablet TAKE 1 TABLET BY MOUTH ONCE DAILY FOR MEMORY  . fluticasone (FLONASE) 50 MCG/ACT nasal spray Place 2 sprays into both nostrils daily.  . furosemide (LASIX) 20 MG tablet Take 1 tablet (20 mg total) by mouth daily.  Marland Kitchen glimepiride (AMARYL) 2 MG tablet Take 2 mg by mouth daily with breakfast.  . glucose blood (ACCU-CHEK GUIDE) test strip   . glucose blood test strip Use as instructed to check blood sugars twice a day.  E11.65  . Insulin Glargine 300 UNIT/ML SOPN Inject 14 Units into the skin at bedtime.  . Insulin Pen Needle (BD PEN NEEDLE NANO U/F) 32G X 4 MM MISC Use as directed  . lidocaine (LIDODERM) 5 % Place 1 patch onto the skin every 12 (twelve) hours. Remove & Discard patch within 12 hours or as directed by MD  . loratadine (CLARITIN) 10 MG tablet Take 1 tablet (10 mg total) by mouth daily.  . metoCLOPramide (REGLAN) 5 MG  tablet Take one tab po bid for nausea and one at bed time prn  . pantoprazole (PROTONIX) 40 MG tablet Take 1 tablet (40 mg total) by mouth 2 (two) times daily.  Marland Kitchen rOPINIRole (REQUIP) 0.5 MG tablet Take 1 tablet (0.5 mg total) by mouth every evening.  . sulfamethoxazole-trimethoprim (BACTRIM DS) 800-160 MG tablet Take 1 tablet by mouth 2 (two) times daily.  . tizanidine (ZANAFLEX) 2 MG capsule   . traMADol (ULTRAM) 50 MG tablet Take 1 tablet (50 mg total) by mouth 2 (two) times daily as needed. for pain  . VICTOZA 18 MG/3ML SOPN INJECT 1.2 MG INTO THE SKIN DAILY AS DIRECTED   . Vitamin D, Ergocalciferol, (DRISDOL) 1.25 MG (50000 UT) CAPS capsule Take 1 capsule by mouth once a week  . warfarin (COUMADIN) 4 MG tablet Take 1 and 1/2 tab po daily  . [DISCONTINUED] amoxicillin-clavulanate (AUGMENTIN) 875-125 MG tablet Take 1 tablet by mouth 2 (two) times daily.  . [DISCONTINUED] ciprofloxacin-dexamethasone (CIPRODEX) OTIC suspension Place 4 drops into the right ear 2 (two) times daily.   No facility-administered encounter medications on file as of 05/20/2019.     Surgical History: Past Surgical History:  Procedure Laterality Date  . ABDOMINAL HYSTERECTOMY    . APPENDECTOMY    . CATARACT EXTRACTION    . CHOLECYSTECTOMY    . HERNIA REPAIR    . right knee  replacement    . right nephroectomy      Medical History: Past Medical History:  Diagnosis Date  . Atrial fibrillation ([place])   . Congestive heart failure (CHF) ([place])   . Diabetes mellitus without complication ([place])   . GERD (gastroesophageal reflux disease)   . Hyperlipidemia   . Hypertension     Family History: Family History  Problem Relation Age of Onset  . Breast cancer Mother     Social History   Socioeconomic History  . Marital status: Widowed    Spouse name: Not on file  . Number of children: Not on file  . Years of education: Not on file  . Highest education level: Not on file  Occupational History  . Not on file   Social Needs  . Financial resource strain: Not on file  . Food insecurity    Worry: Not on file    Inability: Not on file  . Transportation needs    Medical: Not on file    Non-medical: Not on file  Tobacco Use  . Smoking status: Current Every Day Smoker    Packs/day: 1.00    Types: Cigarettes    Last attempt to quit: 01/03/2019    Years since quitting: 0.3  . Smokeless tobacco: Never Used  Substance and Sexual Activity  . Alcohol use: No  . Drug use: No  . Sexual activity: Not on file  Lifestyle  . Physical activity    Days per week: Not on file    Minutes per session: Not on file  . Stress: Not on file  Relationships  . Social Herbalist on phone: Not on file    Gets together: Not on file    Attends religious service: Not on file    Active member of club or organization: Not on file    Attends meetings of clubs or organizations: Not on file    Relationship status: Not on file  . Intimate partner violence    Fear of current or ex partner: Not on file    Emotionally abused: Not on file    Physically abused: Not on file    Forced sexual activity: Not on file  Other Topics Concern  . Not on file  Social History Narrative  . Not on file      Review of Systems  Constitutional: Negative for chills, fatigue and unexpected weight change.  HENT: Negative for congestion, rhinorrhea, sneezing and sore throat.   Respiratory: Negative for cough, chest tightness and shortness of breath.   Cardiovascular: Negative for chest pain and palpitations.  Gastrointestinal: Negative for abdominal pain, constipation, diarrhea, nausea and vomiting.  Endocrine: Negative for cold intolerance, heat intolerance, polydipsia and polyuria.       Blood sugars doing well   Musculoskeletal: Negative for arthralgias, back pain, joint swelling and neck pain.  Skin: Negative for rash.  Allergic/Immunologic: Negative for environmental allergies.  Neurological: Positive for weakness.  Negative for dizziness, tremors, numbness and headaches.  Hematological: Negative for adenopathy. Does not bruise/bleed easily.  Psychiatric/Behavioral: Negative for behavioral problems and sleep disturbance. The patient is not nervous/anxious.     Today's Vitals   05/20/19 1053  BP: 120/78  Pulse: 100  Resp: 16  SpO2: 99%  Weight: 114 lb (51.7 kg)  Height: 4\' 10"  (1.473 m)   Body mass index is 23.83 kg/m.  Physical Exam Vitals signs and nursing note reviewed.  Constitutional:      General: She is not in  acute distress.    Appearance: Normal appearance. She is well-developed. She is not diaphoretic.  HENT:     Head: Normocephalic and atraumatic.     Mouth/Throat:     Pharynx: No oropharyngeal exudate.  Eyes:     Pupils: Pupils are equal, round, and reactive to light.  Neck:     Musculoskeletal: Normal range of motion and neck supple.     Thyroid: No thyromegaly.     Vascular: No JVD.     Trachea: No tracheal deviation.  Cardiovascular:     Rate and Rhythm: Normal rate and regular rhythm.     Pulses:          Dorsalis pedis pulses are 2+ on the right side and 2+ on the left side.       Posterior tibial pulses are 2+ on the right side and 2+ on the left side.     Heart sounds: Normal heart sounds. No murmur. No friction rub. No gallop.   Pulmonary:     Effort: Pulmonary effort is normal. No respiratory distress.     Breath sounds: Normal breath sounds. No wheezing or rales.  Chest:     Chest wall: No tenderness.  Abdominal:     Palpations: Abdomen is soft.     Tenderness: There is no abdominal tenderness. There is no guarding.  Musculoskeletal: Normal range of motion.     Right foot: Normal range of motion. No deformity or bunion.     Left foot: Normal range of motion. No deformity or bunion.  Feet:     Right foot:     Protective Sensation: 10 sites tested. 10 sites sensed.     Skin integrity: Skin integrity normal.     Toenail Condition: Right toenails are normal.      Left foot:     Protective Sensation: 10 sites tested. 10 sites sensed.     Skin integrity: Skin integrity normal.     Toenail Condition: Left toenails are normal.     Comments: The feet are cool to touch bilaterally. She is able to sense all areas which are tested, however, she states she can barely feel light touch.  Lymphadenopathy:     Cervical: No cervical adenopathy.  Skin:    General: Skin is warm and dry.  Neurological:     Mental Status: She is alert and oriented to person, place, and time.     Cranial Nerves: No cranial nerve deficit.  Psychiatric:        Behavior: Behavior normal.        Thought Content: Thought content normal.        Judgment: Judgment normal.    Assessment/Plan: 1. Uncontrolled type 2 diabetes mellitus with hyperglycemia (HCC) - POCT HgB A1C 6.4 today. Continue diabetic medications as prescribed. Refer for diabetic eye exam . - Ambulatory referral to Ophthalmology  2. Essential hypertension Stable. Continue bp medication as prescribed   3. AF (paroxysmal atrial fibrillation) ([place]) Continue all medications as prescribed   General Counseling: Margan verbalizes understanding of the findings of todays visit and agrees with plan of treatment. I have discussed any further diagnostic evaluation that may be needed or ordered today. We also reviewed her medications today. she has been encouraged to call the office with any questions or concerns that should arise related to todays visit.  Diabetes Counseling:  1. Addition of ACE inh/ ARB'S for nephroprotection. Microalbumin is updated  2. Diabetic foot care, prevention of complications. Podiatry consult 3. Exercise  and lose weight.  4. Diabetic eye examination, Diabetic eye exam is updated  5. Monitor blood sugar closlely. nutrition counseling.  6. Sign and symptoms of hypoglycemia including shaking sweating,confusion and headaches.   This patient was seen by Leretha Pol FNP Collaboration with Dr Lavera Guise as a part of collaborative care agreement  Orders Placed This Encounter  Procedures  . Ambulatory referral to Ophthalmology  . POCT HgB A1C    Time spent: 25 Minutes      Dr Lavera Guise Internal medicine

## 2019-05-21 ENCOUNTER — Other Ambulatory Visit: Payer: Self-pay

## 2019-05-21 MED ORDER — BD PEN NEEDLE NANO U/F 32G X 4 MM MISC: 1 refills | Status: DC

## 2019-05-22 ENCOUNTER — Ambulatory Visit: Payer: Self-pay | Admitting: Nurse Practitioner

## 2019-05-29 ENCOUNTER — Encounter: Payer: Self-pay | Admitting: Nurse Practitioner

## 2019-05-29 ENCOUNTER — Encounter: Payer: Self-pay | Admitting: Adult Health

## 2019-05-29 LAB — PROTIME-INR
INR: 1.4 — AB (ref 0.9–1.1)
INR: 3.9 — AB (ref 0.9–1.1)

## 2019-06-06 ENCOUNTER — Other Ambulatory Visit: Payer: Self-pay | Admitting: Adult Health

## 2019-06-06 DIAGNOSIS — Z7901 Long term (current) use of anticoagulants: Secondary | ICD-10-CM | POA: Diagnosis not present

## 2019-06-09 ENCOUNTER — Ambulatory Visit (INDEPENDENT_AMBULATORY_CARE_PROVIDER_SITE_OTHER): Payer: Medicare HMO

## 2019-06-09 ENCOUNTER — Other Ambulatory Visit: Payer: Self-pay

## 2019-06-09 DIAGNOSIS — Z7901 Long term (current) use of anticoagulants: Secondary | ICD-10-CM

## 2019-06-09 NOTE — Progress Notes (Signed)
Pt home Inr 2.7 continue with current treatment

## 2019-06-10 ENCOUNTER — Encounter: Payer: Self-pay | Admitting: Adult Health

## 2019-06-10 LAB — PROTIME-INR: INR: 2.7 — AB (ref 0.9–1.1)

## 2019-06-17 ENCOUNTER — Other Ambulatory Visit: Payer: Self-pay | Admitting: Adult Health

## 2019-06-20 ENCOUNTER — Ambulatory Visit (INDEPENDENT_AMBULATORY_CARE_PROVIDER_SITE_OTHER): Payer: Medicare HMO

## 2019-06-20 DIAGNOSIS — Z7901 Long term (current) use of anticoagulants: Secondary | ICD-10-CM

## 2019-06-20 NOTE — Progress Notes (Signed)
Pt inr 2.2 as per heather continue same med

## 2019-06-24 ENCOUNTER — Encounter: Payer: Self-pay | Admitting: Nurse Practitioner

## 2019-06-24 LAB — PROTIME-INR: INR: 2.2 — AB (ref 0.9–1.1)

## 2019-07-01 ENCOUNTER — Other Ambulatory Visit: Payer: Self-pay

## 2019-07-01 ENCOUNTER — Ambulatory Visit (INDEPENDENT_AMBULATORY_CARE_PROVIDER_SITE_OTHER): Payer: Medicare HMO

## 2019-07-01 DIAGNOSIS — Z7901 Long term (current) use of anticoagulants: Secondary | ICD-10-CM | POA: Diagnosis not present

## 2019-07-01 NOTE — Progress Notes (Signed)
Pt INR 3.1 as per heather is continue same med and follow up next week

## 2019-07-02 ENCOUNTER — Encounter: Payer: Self-pay | Admitting: Nurse Practitioner

## 2019-07-02 LAB — PROTIME-INR: INR: 3.1 — AB (ref 0.9–1.1)

## 2019-07-07 ENCOUNTER — Other Ambulatory Visit: Payer: Self-pay

## 2019-07-07 ENCOUNTER — Ambulatory Visit (INDEPENDENT_AMBULATORY_CARE_PROVIDER_SITE_OTHER): Payer: Medicare HMO

## 2019-07-07 DIAGNOSIS — Z7901 Long term (current) use of anticoagulants: Secondary | ICD-10-CM

## 2019-07-07 NOTE — Progress Notes (Signed)
At home INR 3.1 ,ok per Nira Conn continue current treatment

## 2019-07-16 ENCOUNTER — Encounter: Payer: Self-pay | Admitting: Nurse Practitioner

## 2019-07-16 LAB — PROTIME-INR: INR: 3.1 — AB (ref 0.9–1.1)

## 2019-07-21 ENCOUNTER — Other Ambulatory Visit: Payer: Self-pay

## 2019-07-21 ENCOUNTER — Other Ambulatory Visit: Payer: Self-pay | Admitting: Adult Health

## 2019-07-21 ENCOUNTER — Ambulatory Visit (INDEPENDENT_AMBULATORY_CARE_PROVIDER_SITE_OTHER): Payer: Medicare HMO

## 2019-07-21 DIAGNOSIS — Z7901 Long term (current) use of anticoagulants: Secondary | ICD-10-CM | POA: Diagnosis not present

## 2019-07-21 NOTE — Progress Notes (Signed)
Pt Inr 3.9 continue same med as per [employer]

## 2019-07-30 ENCOUNTER — Other Ambulatory Visit: Payer: Self-pay

## 2019-07-30 ENCOUNTER — Ambulatory Visit (INDEPENDENT_AMBULATORY_CARE_PROVIDER_SITE_OTHER): Payer: Medicare HMO

## 2019-07-30 ENCOUNTER — Telehealth: Payer: Self-pay

## 2019-07-30 DIAGNOSIS — Z7901 Long term (current) use of anticoagulants: Secondary | ICD-10-CM

## 2019-07-30 NOTE — Telephone Encounter (Signed)
Spoke with nurse melody and her daughter taylor [phone] that Pt INR  4.5 high skip today and tomorrow then continue same med as prescribed

## 2019-07-30 NOTE — Progress Notes (Signed)
Pt INR 4.5 as per heather skip today and tomorrow and continue same as pres

## 2019-08-08 ENCOUNTER — Other Ambulatory Visit: Payer: Self-pay | Admitting: Internal Medicine

## 2019-08-11 ENCOUNTER — Other Ambulatory Visit: Payer: Self-pay | Admitting: Nurse Practitioner

## 2019-08-11 MED ORDER — BD PEN NEEDLE NANO U/F 32G X 4 MM MISC: 1 refills | Status: DC

## 2019-08-12 ENCOUNTER — Ambulatory Visit (INDEPENDENT_AMBULATORY_CARE_PROVIDER_SITE_OTHER): Payer: Medicare HMO

## 2019-08-12 ENCOUNTER — Other Ambulatory Visit: Payer: Self-pay

## 2019-08-12 DIAGNOSIS — Z7901 Long term (current) use of anticoagulants: Secondary | ICD-10-CM | POA: Diagnosis not present

## 2019-08-12 NOTE — Progress Notes (Addendum)
Home INR was 1.8  Per heather 2  tabs today and then continue with current treatment.

## 2019-08-19 ENCOUNTER — Ambulatory Visit: Payer: Medicare HMO | Admitting: Nurse Practitioner

## 2019-08-22 ENCOUNTER — Other Ambulatory Visit: Payer: Self-pay

## 2019-08-22 ENCOUNTER — Ambulatory Visit (INDEPENDENT_AMBULATORY_CARE_PROVIDER_SITE_OTHER): Payer: Medicare HMO

## 2019-08-22 DIAGNOSIS — Z7901 Long term (current) use of anticoagulants: Secondary | ICD-10-CM | POA: Diagnosis not present

## 2019-08-22 NOTE — Progress Notes (Signed)
Pt INR 4.4 as per heather skip today and tomorrow and go back to normal

## 2019-08-26 ENCOUNTER — Other Ambulatory Visit: Payer: Self-pay

## 2019-08-26 ENCOUNTER — Ambulatory Visit (INDEPENDENT_AMBULATORY_CARE_PROVIDER_SITE_OTHER): Payer: Medicare HMO

## 2019-08-26 DIAGNOSIS — I4891 Unspecified atrial fibrillation: Secondary | ICD-10-CM | POA: Diagnosis not present

## 2019-08-26 DIAGNOSIS — Z7901 Long term (current) use of anticoagulants: Secondary | ICD-10-CM | POA: Diagnosis not present

## 2019-08-26 NOTE — Progress Notes (Signed)
Home inr 1.9 , per heather continue with current treatment recheck in one week

## 2019-09-02 ENCOUNTER — Ambulatory Visit (INDEPENDENT_AMBULATORY_CARE_PROVIDER_SITE_OTHER): Payer: Medicare HMO | Admitting: Adult Health

## 2019-09-02 ENCOUNTER — Encounter: Payer: Self-pay | Admitting: Adult Health

## 2019-09-02 ENCOUNTER — Other Ambulatory Visit: Payer: Self-pay

## 2019-09-02 VITALS — BP 135/50 | HR 76 | Temp 97.6°F | Resp 16 | Ht <= 58 in | Wt 119.0 lb

## 2019-09-02 DIAGNOSIS — R300 Dysuria: Secondary | ICD-10-CM | POA: Diagnosis not present

## 2019-09-02 DIAGNOSIS — N390 Urinary tract infection, site not specified: Secondary | ICD-10-CM

## 2019-09-02 DIAGNOSIS — E1165 Type 2 diabetes mellitus with hyperglycemia: Secondary | ICD-10-CM

## 2019-09-02 DIAGNOSIS — M25512 Pain in left shoulder: Secondary | ICD-10-CM | POA: Diagnosis not present

## 2019-09-02 DIAGNOSIS — R319 Hematuria, unspecified: Secondary | ICD-10-CM

## 2019-09-02 DIAGNOSIS — Z23 Encounter for immunization: Secondary | ICD-10-CM | POA: Diagnosis not present

## 2019-09-02 LAB — POCT URINALYSIS DIPSTICK
Bilirubin, UA: NEGATIVE
Glucose, UA: NEGATIVE
Ketones, UA: NEGATIVE
Leukocytes, UA: NEGATIVE
Nitrite, UA: NEGATIVE
Protein, UA: POSITIVE — AB
Spec Grav, UA: 1.01 (ref 1.010–1.025)
Urobilinogen, UA: 0.2 E.U./dL
pH, UA: 5 (ref 5.0–8.0)

## 2019-09-02 LAB — POCT GLYCOSYLATED HEMOGLOBIN (HGB A1C): Hemoglobin A1C: 7.7 % — AB (ref 4.0–5.6)

## 2019-09-02 MED ORDER — NITROFURANTOIN MONOHYD MACRO 100 MG PO CAPS: 100.0000 mg | ORAL_CAPSULE | Freq: Two times a day (BID) | ORAL | 0 refills | Status: DC

## 2019-09-02 MED ORDER — TRAMADOL HCL 50 MG PO TABS: 50.0000 mg | ORAL_TABLET | Freq: Two times a day (BID) | ORAL | 0 refills | Status: DC | PRN

## 2019-09-02 NOTE — Progress Notes (Signed)
[facility] [place], [place] [address]  Internal MEDICINE  Office Visit Note  Patient Name: [name]  [id number]  [id number]  Date of Service: 09/02/2019  Chief Complaint  Patient presents with  . Hyperlipidemia  . Hypertension  . Diabetes  . Medical Management of Chronic Issues    urine odor     HPI  Pt is here for 3 month follow up.  Her A1C has increased today to 7.7 from 6.4.  She reports she has been eating a lot of potatoes, and sweets in the last few months.  She also reports gaining 5 pounds.  She also reports low back pain and maldorous urine.  Will check urine dip today.     Current Medication: Outpatient Encounter Medications as of 09/02/2019  Medication Sig  . ACCU-CHEK FASTCLIX LANCETS MISC   . acetaminophen-codeine (TYLENOL #3) 300-30 MG tablet Take 1 tablet by mouth every 6 (six) hours as needed for moderate pain.  . Alcohol Swabs (B-D SINGLE USE SWABS REGULAR) PADS USE AS DIRECTED TWICE DAILY  . atorvastatin (LIPITOR) 10 MG tablet Take 1 tablet (10 mg total) by mouth every evening.  . citalopram (CELEXA) 40 MG tablet Take 1 tablet by mouth once daily  . digoxin (LANOXIN) 0.125 MG tablet TAKE 1 TABLET BY MOUTH ONCE DAILY AFTER FINISHING LOADING DOSE  . donepezil (ARICEPT) 5 MG tablet TAKE 1 TABLET BY MOUTH ONCE DAILY FOR MEMORY  . fluticasone (FLONASE) 50 MCG/ACT nasal spray Place 2 sprays into both nostrils daily.  . furosemide (LASIX) 20 MG tablet Take 1 tablet (20 mg total) by mouth daily.  Marland Kitchen glimepiride (AMARYL) 2 MG tablet Take 2 mg by mouth daily with breakfast.  . glucose blood (ACCU-CHEK GUIDE) test strip   . glucose blood test strip Use as instructed to check blood sugars twice a day.  E11.65  . Insulin Glargine 300 UNIT/ML SOPN Inject 14 Units into the skin at bedtime.  . Insulin Pen Needle (BD PEN NEEDLE NANO U/F) 32G X 4 MM MISC Use as directed  . lidocaine (LIDODERM) 5 % Place 1 patch onto the skin every 12 (twelve)  hours. Remove & Discard patch within 12 hours or as directed by MD  . loratadine (CLARITIN) 10 MG tablet Take 1 tablet (10 mg total) by mouth daily.  . metoCLOPramide (REGLAN) 5 MG tablet Take one tab po bid for nausea and one at bed time prn  . pantoprazole (PROTONIX) 40 MG tablet Take 1 tablet (40 mg total) by mouth 2 (two) times daily.  Marland Kitchen rOPINIRole (REQUIP) 0.5 MG tablet Take 1 tablet (0.5 mg total) by mouth every evening.  . sulfamethoxazole-trimethoprim (BACTRIM DS) 800-160 MG tablet Take 1 tablet by mouth 2 (two) times daily.  . tizanidine (ZANAFLEX) 2 MG capsule   . traMADol (ULTRAM) 50 MG tablet Take 1 tablet (50 mg total) by mouth 2 (two) times daily as needed. for pain  . VICTOZA 18 MG/3ML SOPN INJECT 1.2 MG INTO THE SKIN DAILY AS DIRECTED   . Vitamin D, Ergocalciferol, (DRISDOL) 1.25 MG (50000 UT) CAPS capsule Take 1 capsule by mouth once a week  . warfarin (COUMADIN) 4 MG tablet Take 1 and 1/2 tab po daily   No facility-administered encounter medications on file as of 09/02/2019.     Surgical History: Past Surgical History:  Procedure Laterality Date  . ABDOMINAL HYSTERECTOMY    . APPENDECTOMY    . CATARACT EXTRACTION    . CHOLECYSTECTOMY    .  HERNIA REPAIR    . right knee replacement    . right nephroectomy      Medical History: Past Medical History:  Diagnosis Date  . Atrial fibrillation ([place])   . Congestive heart failure (CHF) ([place])   . Diabetes mellitus without complication ([place])   . GERD (gastroesophageal reflux disease)   . Hyperlipidemia   . Hypertension     Family History: Family History  Problem Relation Age of Onset  . Breast cancer Mother     Social History   Socioeconomic History  . Marital status: Widowed    Spouse name: Not on file  . Number of children: Not on file  . Years of education: Not on file  . Highest education level: Not on file  Occupational History  . Not on file  Social Needs  . Financial resource strain: Not on file  .  Food insecurity    Worry: Not on file    Inability: Not on file  . Transportation needs    Medical: Not on file    Non-medical: Not on file  Tobacco Use  . Smoking status: Current Every Day Smoker    Packs/day: 1.00    Types: Cigarettes    Last attempt to quit: 01/03/2019    Years since quitting: 0.6  . Smokeless tobacco: Never Used  Substance and Sexual Activity  . Alcohol use: No  . Drug use: No  . Sexual activity: Not on file  Lifestyle  . Physical activity    Days per week: Not on file    Minutes per session: Not on file  . Stress: Not on file  Relationships  . Social Herbalist on phone: Not on file    Gets together: Not on file    Attends religious service: Not on file    Active member of club or organization: Not on file    Attends meetings of clubs or organizations: Not on file    Relationship status: Not on file  . Intimate partner violence    Fear of current or ex partner: Not on file    Emotionally abused: Not on file    Physically abused: Not on file    Forced sexual activity: Not on file  Other Topics Concern  . Not on file  Social History Narrative  . Not on file      Review of Systems  Constitutional: Negative for chills, fatigue and unexpected weight change.  HENT: Negative for congestion, rhinorrhea, sneezing and sore throat.   Eyes: Negative for photophobia, pain and redness.  Respiratory: Negative for cough, chest tightness and shortness of breath.   Cardiovascular: Negative for chest pain and palpitations.  Gastrointestinal: Negative for abdominal pain, constipation, diarrhea, nausea and vomiting.  Endocrine: Negative.   Genitourinary: Negative for dysuria and frequency.  Musculoskeletal: Negative for arthralgias, back pain, joint swelling and neck pain.  Skin: Negative for rash.  Allergic/Immunologic: Negative.   Neurological: Negative for tremors and numbness.  Hematological: Negative for adenopathy. Does not bruise/bleed easily.   Psychiatric/Behavioral: Negative for behavioral problems and sleep disturbance. The patient is not nervous/anxious.     Vital Signs: BP (!) 135/50   Pulse 76   Temp 97.6 F (36.4 C)   Resp 16   Ht 4\' 10"  (1.473 m)   Wt 119 lb (54 kg)   SpO2 98%   BMI 24.87 kg/m    Physical Exam Vitals signs and nursing note reviewed.  Constitutional:  General: She is not in acute distress.    Appearance: She is well-developed. She is not diaphoretic.  HENT:     Head: Normocephalic and atraumatic.     Mouth/Throat:     Pharynx: No oropharyngeal exudate.  Eyes:     Pupils: Pupils are equal, round, and reactive to light.  Neck:     Musculoskeletal: Normal range of motion and neck supple.     Thyroid: No thyromegaly.     Vascular: No JVD.     Trachea: No tracheal deviation.  Cardiovascular:     Rate and Rhythm: Normal rate and regular rhythm.     Heart sounds: Normal heart sounds. No murmur. No friction rub. No gallop.   Pulmonary:     Effort: Pulmonary effort is normal. No respiratory distress.     Breath sounds: Normal breath sounds. No wheezing or rales.  Chest:     Chest wall: No tenderness.  Abdominal:     Palpations: Abdomen is soft.     Tenderness: There is no abdominal tenderness. There is no guarding.  Musculoskeletal: Normal range of motion.  Lymphadenopathy:     Cervical: No cervical adenopathy.  Skin:    General: Skin is warm and dry.  Neurological:     Mental Status: She is alert and oriented to person, place, and time.     Cranial Nerves: No cranial nerve deficit.  Psychiatric:        Behavior: Behavior normal.        Thought Content: Thought content normal.        Judgment: Judgment normal.    Assessment/Plan: 1. Uncontrolled type 2 diabetes mellitus with hyperglycemia (HCC) A1C elevated 7.7 discussed dietary modification.  Will increase Glargine by 2 units a day, to a max of 18 units.  - POCT HgB A1C - Microalbumin, urine  2. Flu vaccine need - Flu  Vaccine MDCK QUAD PF  3. Dysuria - POCT Urinalysis Dipstick  4. Urinary tract infection with hematuria, site unspecified Advised patient to take entire course of antibiotics as prescribed with food. Pt should return to clinic in 7-10 days if symptoms fail to improve or new symptoms develop.  - CULTURE, URINE COMPREHENSIVE - nitrofurantoin, macrocrystal-monohydrate, (MACROBID) 100 MG capsule; Take 1 capsule (100 mg total) by mouth 2 (two) times daily.  Dispense: 14 capsule; Refill: 0  5. Acute pain of left shoulder Reviewed risks and possible side effects associated with taking opiates, benzodiazepines and other CNS depressants. Combination of these could cause dizziness and drowsiness. Advised patient not to drive or operate machinery when taking these medications, as patient's and other's life can be at risk and will have consequences. Patient verbalized understanding in this matter. Dependence and abuse for these drugs will be monitored closely. A Controlled substance policy and procedure is on file which allows [facility] to order a urine drug screen test at any visit. Patient understands and agrees with the plan - traMADol (ULTRAM) 50 MG tablet; Take 1 tablet (50 mg total) by mouth 2 (two) times daily as needed. for pain  Dispense: 45 tablet; Refill: 0  General Counseling: Ranetta verbalizes understanding of the findings of todays visit and agrees with plan of treatment. I have discussed any further diagnostic evaluation that may be needed or ordered today. We also reviewed her medications today. she has been encouraged to call the office with any questions or concerns that should arise related to todays visit.    Orders Placed This Encounter  Procedures  .  CULTURE, URINE COMPREHENSIVE  . Flu Vaccine MDCK QUAD PF  . Microalbumin, urine  . POCT HgB A1C  . POCT Urinalysis Dipstick    No orders of the defined types were placed in this encounter.   Time spent: 25  Minutes   This patient was seen by Orson Gear AGNP-C in Collaboration with Dr Lavera Guise as a part of collaborative care agreement     Kendell Bane AGNP-C Internal medicine

## 2019-09-03 LAB — MICROALBUMIN, URINE: Microalbumin, Urine: 203.9 ug/mL

## 2019-09-04 ENCOUNTER — Other Ambulatory Visit: Payer: Self-pay | Admitting: Adult Health

## 2019-09-04 MED ORDER — BD SWAB SINGLE USE REGULAR PADS: MEDICATED_PAD | 3 refills | Status: DC

## 2019-09-06 LAB — CULTURE, URINE COMPREHENSIVE

## 2019-09-11 ENCOUNTER — Ambulatory Visit (INDEPENDENT_AMBULATORY_CARE_PROVIDER_SITE_OTHER): Payer: Medicare HMO

## 2019-09-11 ENCOUNTER — Other Ambulatory Visit: Payer: Self-pay

## 2019-09-11 DIAGNOSIS — Z7901 Long term (current) use of anticoagulants: Secondary | ICD-10-CM | POA: Diagnosis not present

## 2019-09-11 NOTE — Progress Notes (Signed)
Pt INR 4.4 as per adam  Skip today and tomorrow and then continue same as prescribed

## 2019-09-22 ENCOUNTER — Ambulatory Visit (INDEPENDENT_AMBULATORY_CARE_PROVIDER_SITE_OTHER): Payer: Medicare HMO

## 2019-09-22 ENCOUNTER — Other Ambulatory Visit: Payer: Self-pay

## 2019-09-22 DIAGNOSIS — I4891 Unspecified atrial fibrillation: Secondary | ICD-10-CM

## 2019-09-22 DIAGNOSIS — Z7901 Long term (current) use of anticoagulants: Secondary | ICD-10-CM | POA: Diagnosis not present

## 2019-09-22 NOTE — Progress Notes (Signed)
Pt INR 3.1 continue same med

## 2019-09-25 ENCOUNTER — Telehealth: Payer: Self-pay

## 2019-09-25 NOTE — Telephone Encounter (Signed)
Called lmom informing patient of appointment. klh 

## 2019-09-29 ENCOUNTER — Ambulatory Visit (INDEPENDENT_AMBULATORY_CARE_PROVIDER_SITE_OTHER): Payer: Medicare HMO

## 2019-09-29 ENCOUNTER — Telehealth: Payer: Self-pay

## 2019-09-29 ENCOUNTER — Ambulatory Visit: Payer: Medicare HMO | Admitting: Adult Health

## 2019-09-29 ENCOUNTER — Other Ambulatory Visit: Payer: Self-pay

## 2019-09-29 DIAGNOSIS — Z7901 Long term (current) use of anticoagulants: Secondary | ICD-10-CM | POA: Diagnosis not present

## 2019-09-29 NOTE — Progress Notes (Signed)
Pt inr was 4.7 09/28/2019 and today her inr was 3.3. pt daughter stated that she did not give pt warfarin on 09/28/2019. spoke with adam and advise pt daughter to hold dose for 09/29/19 and then continue taking normally ( 1.5 tabs daily), but hold one day weekly. If pt daughter decided to hold medication on Friday then hold on each Friday from then on.

## 2019-09-29 NOTE — Telephone Encounter (Signed)
Patient rescheduled appointment on 09/29/2019 to 10/16/2019. klh

## 2019-09-30 ENCOUNTER — Other Ambulatory Visit: Payer: Self-pay | Admitting: Adult Health

## 2019-09-30 ENCOUNTER — Ambulatory Visit: Payer: Medicare HMO

## 2019-09-30 ENCOUNTER — Other Ambulatory Visit: Payer: Self-pay

## 2019-10-07 ENCOUNTER — Other Ambulatory Visit: Payer: Self-pay | Admitting: Adult Health

## 2019-10-09 ENCOUNTER — Ambulatory Visit (INDEPENDENT_AMBULATORY_CARE_PROVIDER_SITE_OTHER): Payer: Medicare HMO

## 2019-10-09 ENCOUNTER — Other Ambulatory Visit: Payer: Self-pay

## 2019-10-09 DIAGNOSIS — Z7901 Long term (current) use of anticoagulants: Secondary | ICD-10-CM | POA: Diagnosis not present

## 2019-10-09 NOTE — Progress Notes (Signed)
Home inr 3.4 per Q[place]a Skye hold tonight and for now on until further notice hold Monday and [place] .daughter understood

## 2019-10-14 ENCOUNTER — Telehealth: Payer: Self-pay

## 2019-10-14 NOTE — Telephone Encounter (Signed)
Called lmom informing patient of appointment. klh 

## 2019-10-15 ENCOUNTER — Ambulatory Visit: Payer: Medicare HMO

## 2019-10-16 ENCOUNTER — Ambulatory Visit (INDEPENDENT_AMBULATORY_CARE_PROVIDER_SITE_OTHER): Payer: Medicare HMO | Admitting: Adult Health

## 2019-10-16 ENCOUNTER — Other Ambulatory Visit: Payer: Self-pay

## 2019-10-16 ENCOUNTER — Telehealth: Payer: Self-pay

## 2019-10-16 VITALS — BP 167/58 | HR 66 | Temp 97.4°F | Resp 16 | Ht <= 58 in | Wt 120.0 lb

## 2019-10-16 DIAGNOSIS — K219 Gastro-esophageal reflux disease without esophagitis: Secondary | ICD-10-CM

## 2019-10-16 DIAGNOSIS — G2581 Restless legs syndrome: Secondary | ICD-10-CM | POA: Diagnosis not present

## 2019-10-16 DIAGNOSIS — F0150 Vascular dementia without behavioral disturbance: Secondary | ICD-10-CM

## 2019-10-16 DIAGNOSIS — I10 Essential (primary) hypertension: Secondary | ICD-10-CM | POA: Diagnosis not present

## 2019-10-16 DIAGNOSIS — J0100 Acute maxillary sinusitis, unspecified: Secondary | ICD-10-CM | POA: Diagnosis not present

## 2019-10-16 DIAGNOSIS — E782 Mixed hyperlipidemia: Secondary | ICD-10-CM

## 2019-10-16 DIAGNOSIS — Z7901 Long term (current) use of anticoagulants: Secondary | ICD-10-CM | POA: Diagnosis not present

## 2019-10-16 DIAGNOSIS — J302 Other seasonal allergic rhinitis: Secondary | ICD-10-CM

## 2019-10-16 DIAGNOSIS — E1165 Type 2 diabetes mellitus with hyperglycemia: Secondary | ICD-10-CM | POA: Diagnosis not present

## 2019-10-16 DIAGNOSIS — I4891 Unspecified atrial fibrillation: Secondary | ICD-10-CM

## 2019-10-16 DIAGNOSIS — F339 Major depressive disorder, recurrent, unspecified: Secondary | ICD-10-CM

## 2019-10-16 DIAGNOSIS — I5032 Chronic diastolic (congestive) heart failure: Secondary | ICD-10-CM

## 2019-10-16 MED ORDER — LORATADINE 10 MG PO TABS: 10.0000 mg | ORAL_TABLET | Freq: Every day | ORAL | 6 refills | Status: DC

## 2019-10-16 MED ORDER — WARFARIN SODIUM 4 MG PO TABS: ORAL_TABLET | ORAL | 3 refills | Status: DC

## 2019-10-16 MED ORDER — PANTOPRAZOLE SODIUM 40 MG PO TBEC: 40.0000 mg | DELAYED_RELEASE_TABLET | Freq: Two times a day (BID) | ORAL | 1 refills | Status: DC

## 2019-10-16 MED ORDER — ATORVASTATIN CALCIUM 10 MG PO TABS: 10.0000 mg | ORAL_TABLET | Freq: Every evening | ORAL | 1 refills | Status: DC

## 2019-10-16 MED ORDER — CITALOPRAM HYDROBROMIDE 40 MG PO TABS: 40.0000 mg | ORAL_TABLET | Freq: Every day | ORAL | 1 refills | Status: DC

## 2019-10-16 MED ORDER — ROPINIROLE HCL 0.5 MG PO TABS: 0.5000 mg | ORAL_TABLET | Freq: Every evening | ORAL | 1 refills | Status: DC

## 2019-10-16 MED ORDER — AMOXICILLIN-POT CLAVULANATE 875-125 MG PO TABS: 1.0000 | ORAL_TABLET | Freq: Two times a day (BID) | ORAL | 0 refills | Status: DC

## 2019-10-16 MED ORDER — DONEPEZIL HCL 5 MG PO TABS: ORAL_TABLET | ORAL | 2 refills | Status: DC

## 2019-10-16 MED ORDER — FUROSEMIDE 20 MG PO TABS: 20.0000 mg | ORAL_TABLET | Freq: Every day | ORAL | 0 refills | Status: DC

## 2019-10-16 MED ORDER — DIGOXIN 125 MCG PO TABS: 0.1250 mg | ORAL_TABLET | Freq: Every day | ORAL | 1 refills | Status: DC

## 2019-10-16 NOTE — Progress Notes (Signed)
[facility] [place], [place] [address]  Internal MEDICINE  Office Visit Note  Patient Name: [name]  [id number]  [id number]  Date of Service: 10/16/2019  Chief Complaint  Patient presents with  . Hypertension  . Diabetes  . Hyperlipidemia    HPI  Pt is here for follow up on HTN, DM and HLD.  Overall she is doing well.  She is a poor historian, her granddaughter is in exam room with her.  The patient believes her daughter, who give her medications, has incrased the glargine daily dose as instructed at our last visit.  Her blood pressure is slightly elevated initially at 167/58.  She is complaining today of right ear discomfort.  She reports when she drinks cold fluids, she feels pain in her right ear.  Also when she breaths, she feels like its going into her ear. She has some facial tenderness, and reports post nasal drip as well as runny nose.      Current Medication: Outpatient Encounter Medications as of 10/16/2019  Medication Sig  . ACCU-CHEK FASTCLIX LANCETS MISC   . acetaminophen-codeine (TYLENOL #3) 300-30 MG tablet Take 1 tablet by mouth every 6 (six) hours as needed for moderate pain.  . Alcohol Swabs (B-D SINGLE USE SWABS REGULAR) PADS USE AS DIRECTED TWICE DAILY  . amoxicillin-clavulanate (AUGMENTIN) 875-125 MG tablet Take 1 tablet by mouth 2 (two) times daily.  Marland Kitchen atorvastatin (LIPITOR) 10 MG tablet Take 1 tablet (10 mg total) by mouth every evening.  . citalopram (CELEXA) 40 MG tablet Take 1 tablet (40 mg total) by mouth daily.  . digoxin (LANOXIN) 0.125 MG tablet Take 1 tablet (0.125 mg total) by mouth daily.  Marland Kitchen donepezil (ARICEPT) 5 MG tablet TAKE 1 TABLET BY MOUTH ONCE DAILY FOR MEMORY  . fluticasone (FLONASE) 50 MCG/ACT nasal spray Place 2 sprays into both nostrils daily.  . furosemide (LASIX) 20 MG tablet Take 1 tablet (20 mg total) by mouth daily.  Marland Kitchen glimepiride (AMARYL) 2 MG tablet Take 2 mg by mouth daily with breakfast.  .  glucose blood (ACCU-CHEK GUIDE) test strip   . glucose blood test strip Use as instructed to check blood sugars twice a day.  E11.65  . Insulin Glargine 300 UNIT/ML SOPN Inject 14 Units into the skin at bedtime.  . Insulin Pen Needle (BD PEN NEEDLE NANO U/F) 32G X 4 MM MISC Use as directed  . lidocaine (LIDODERM) 5 % Place 1 patch onto the skin every 12 (twelve) hours. Remove & Discard patch within 12 hours or as directed by MD  . loratadine (CLARITIN) 10 MG tablet Take 1 tablet (10 mg total) by mouth daily.  . metoCLOPramide (REGLAN) 5 MG tablet Take one tab po bid for nausea and one at bed time prn  . nitrofurantoin, macrocrystal-monohydrate, (MACROBID) 100 MG capsule Take 1 capsule (100 mg total) by mouth 2 (two) times daily.  . pantoprazole (PROTONIX) 40 MG tablet Take 1 tablet (40 mg total) by mouth 2 (two) times daily.  Marland Kitchen rOPINIRole (REQUIP) 0.5 MG tablet Take 1 tablet (0.5 mg total) by mouth every evening.  . sulfamethoxazole-trimethoprim (BACTRIM DS) 800-160 MG tablet Take 1 tablet by mouth 2 (two) times daily.  . tizanidine (ZANAFLEX) 2 MG capsule   . traMADol (ULTRAM) 50 MG tablet Take 1 tablet (50 mg total) by mouth 2 (two) times daily as needed. for pain  . VICTOZA 18 MG/3ML SOPN INJECT 1.2 MG INTO THE SKIN DAILY AS DIRECTED   .  Vitamin D, Ergocalciferol, (DRISDOL) 1.25 MG (50000 UT) CAPS capsule Take 1 capsule by mouth once a week  . warfarin (COUMADIN) 4 MG tablet Take 1 and 1/2 tab po daily except for Monday and friday  . [DISCONTINUED] atorvastatin (LIPITOR) 10 MG tablet TAKE 1 TABLET BY MOUTH ONCE DAILY IN THE EVENING  . [DISCONTINUED] citalopram (CELEXA) 40 MG tablet Take 1 tablet by mouth once daily  . [DISCONTINUED] digoxin (LANOXIN) 0.125 MG tablet TAKE 1 TABLET BY MOUTH ONCE DAILY AFTER FINISHING LOADING DOSE  . [DISCONTINUED] donepezil (ARICEPT) 5 MG tablet TAKE 1 TABLET BY MOUTH ONCE DAILY FOR MEMORY  . [DISCONTINUED] furosemide (LASIX) 20 MG tablet Take 1 tablet by mouth  once daily  . [DISCONTINUED] loratadine (CLARITIN) 10 MG tablet Take 1 tablet (10 mg total) by mouth daily.  . [DISCONTINUED] pantoprazole (PROTONIX) 40 MG tablet Take 1 tablet (40 mg total) by mouth 2 (two) times daily.  . [DISCONTINUED] rOPINIRole (REQUIP) 0.5 MG tablet Take 1 tablet (0.5 mg total) by mouth every evening.  . [DISCONTINUED] warfarin (COUMADIN) 4 MG tablet Take 1 and 1/2 tab po daily (Patient taking differently: Take 1 and 1/2 tab po daily except for Monday and friday)   No facility-administered encounter medications on file as of 10/16/2019.    Surgical History: Past Surgical History:  Procedure Laterality Date  . ABDOMINAL HYSTERECTOMY    . APPENDECTOMY    . CATARACT EXTRACTION    . CHOLECYSTECTOMY    . HERNIA REPAIR    . right knee replacement    . right nephroectomy      Medical History: Past Medical History:  Diagnosis Date  . Atrial fibrillation ([place])   . Congestive heart failure (CHF) ([place])   . Diabetes mellitus without complication ([place])   . GERD (gastroesophageal reflux disease)   . Hyperlipidemia   . Hypertension     Family History: Family History  Problem Relation Age of Onset  . Breast cancer Mother     Social History   Socioeconomic History  . Marital status: Widowed    Spouse name: Not on file  . Number of children: Not on file  . Years of education: Not on file  . Highest education level: Not on file  Occupational History  . Not on file  Tobacco Use  . Smoking status: Current Every Day Smoker    Packs/day: 1.00    Types: Cigarettes    Last attempt to quit: 01/03/2019    Years since quitting: 0.7  . Smokeless tobacco: Never Used  Substance and Sexual Activity  . Alcohol use: No  . Drug use: No  . Sexual activity: Not on file  Other Topics Concern  . Not on file  Social History Narrative  . Not on file   Social Determinants of Health   Financial Resource Strain:   . Difficulty of Paying Living Expenses: Not on file  Food  Insecurity:   . Worried About Charity fundraiser in the Last Year: Not on file  . Ran Out of Food in the Last Year: Not on file  Transportation Needs:   . Lack of Transportation (Medical): Not on file  . Lack of Transportation (Non-Medical): Not on file  Physical Activity:   . Days of Exercise per Week: Not on file  . Minutes of Exercise per Session: Not on file  Stress:   . Feeling of Stress : Not on file  Social Connections:   . Frequency of Communication with Friends and Family:  Not on file  . Frequency of Social Gatherings with Friends and Family: Not on file  . Attends Religious Services: Not on file  . Active Member of Clubs or Organizations: Not on file  . Attends Archivist Meetings: Not on file  . Marital Status: Not on file  Intimate Partner Violence:   . Fear of Current or Ex-Partner: Not on file  . Emotionally Abused: Not on file  . Physically Abused: Not on file  . Sexually Abused: Not on file      Review of Systems  Constitutional: Negative for chills, fatigue and unexpected weight change.  HENT: Positive for congestion, ear pain, postnasal drip and sinus pressure. Negative for rhinorrhea, sneezing and sore throat.   Eyes: Negative for photophobia, pain and redness.  Respiratory: Negative for cough, chest tightness and shortness of breath.   Cardiovascular: Negative for chest pain and palpitations.  Gastrointestinal: Negative for abdominal pain, constipation, diarrhea, nausea and vomiting.  Endocrine: Negative.   Genitourinary: Negative for dysuria and frequency.  Musculoskeletal: Negative for arthralgias, back pain, joint swelling and neck pain.  Skin: Negative for rash.  Allergic/Immunologic: Negative.   Neurological: Negative for tremors and numbness.  Hematological: Negative for adenopathy. Does not bruise/bleed easily.  Psychiatric/Behavioral: Negative for behavioral problems and sleep disturbance. The patient is not nervous/anxious.     Vital  Signs: BP (!) 167/58   Pulse 66   Temp (!) 97.4 F (36.3 C)   Resp 16   Ht 4\' 10"  (1.473 m)   Wt 120 lb (54.4 kg)   SpO2 98%   BMI 25.08 kg/m    Physical Exam Vitals and nursing note reviewed.  Constitutional:      General: She is not in acute distress.    Appearance: She is well-developed. She is not diaphoretic.  HENT:     Head: Normocephalic and atraumatic.     Mouth/Throat:     Pharynx: No oropharyngeal exudate.  Eyes:     Pupils: Pupils are equal, round, and reactive to light.  Neck:     Thyroid: No thyromegaly.     Vascular: No JVD.     Trachea: No tracheal deviation.  Cardiovascular:     Rate and Rhythm: Normal rate and regular rhythm.     Heart sounds: Normal heart sounds. No murmur. No friction rub. No gallop.   Pulmonary:     Effort: Pulmonary effort is normal. No respiratory distress.     Breath sounds: Normal breath sounds. No wheezing or rales.  Chest:     Chest wall: No tenderness.  Abdominal:     Palpations: Abdomen is soft.     Tenderness: There is no abdominal tenderness. There is no guarding.  Musculoskeletal:        General: Normal range of motion.     Cervical back: Normal range of motion and neck supple.  Lymphadenopathy:     Cervical: No cervical adenopathy.  Skin:    General: Skin is warm and dry.  Neurological:     Mental Status: She is alert and oriented to person, place, and time.     Cranial Nerves: No cranial nerve deficit.  Psychiatric:        Behavior: Behavior normal.        Thought Content: Thought content normal.        Judgment: Judgment normal.    Assessment/Plan: 1. Subacute maxillary sinusitis Advised patient to take entire course of antibiotics as prescribed with food. Pt should return to clinic  in 7-10 days if symptoms fail to improve or new symptoms develop.  - amoxicillin-clavulanate (AUGMENTIN) 875-125 MG tablet; Take 1 tablet by mouth 2 (two) times daily.  Dispense: 14 tablet; Refill: 0  2. Uncontrolled type 2  diabetes mellitus with hyperglycemia (HCC) Pt has not taken her blood sugar today.  Yesterdays was 145 mg/dl.  Instructed patient and grandaughter to increase glargine to 20 units max, and follow up as scheduled.  Call for elevated, or low blood sugars.   3. Seasonal allergic rhinitis, unspecified trigger Refilled claritin. - loratadine (CLARITIN) 10 MG tablet; Take 1 tablet (10 mg total) by mouth daily.  Dispense: 30 tablet; Refill: 6  4. Mixed hyperlipidemia Pt needs lipid panel drawn, ordered at this time - atorvastatin (LIPITOR) 10 MG tablet; Take 1 tablet (10 mg total) by mouth every evening.  Dispense: 90 tablet; Refill: 1  5. Long term (current) use of anticoagulants Continue coumadin as directed.  6. Atrial fibrillation, unspecified type ([place]) Controled, continue digoxin as directed.  - digoxin (LANOXIN) 0.125 MG tablet; Take 1 tablet (0.125 mg total) by mouth daily.  Dispense: 90 tablet; Refill: 1 - warfarin (COUMADIN) 4 MG tablet; Take 1 and 1/2 tab po daily except for Monday and friday  Dispense: 145 tablet; Refill: 3  7. Essential hypertension Pt did not take medications yet today.  Instructed to call clinic if BP remains elevated with medications.   8. Restless leg Refilled medications.  - rOPINIRole (REQUIP) 0.5 MG tablet; Take 1 tablet (0.5 mg total) by mouth every evening.  Dispense: 90 tablet; Refill: 1  9. Gastroesophageal reflux disease without esophagitis Continue present management.  Refilled protonix.  - pantoprazole (PROTONIX) 40 MG tablet; Take 1 tablet (40 mg total) by mouth 2 (two) times daily.  Dispense: 180 tablet; Refill: 1  10. Vascular dementia without behavioral disturbance (HCC) Continue Aricept, refilled provided.  - donepezil (ARICEPT) 5 MG tablet; TAKE 1 TABLET BY MOUTH ONCE DAILY FOR MEMORY  Dispense: 90 tablet; Refill: 2  11. Depression, recurrent ([place]) Continue celexa, good control of symptoms.   - citalopram (CELEXA) 40 MG tablet; Take 1  tablet (40 mg total) by mouth daily.  Dispense: 90 tablet; Refill: 1  12. Chronic diastolic congestive heart failure ([place]) Refill provided.  - furosemide (LASIX) 20 MG tablet; Take 1 tablet (20 mg total) by mouth daily.  Dispense: 90 tablet; Refill: 0 -BMP  General Counseling: Olga verbalizes understanding of the findings of todays visit and agrees with plan of treatment. I have discussed any further diagnostic evaluation that may be needed or ordered today. We also reviewed her medications today. she has been encouraged to call the office with any questions or concerns that should arise related to todays visit.    Orders Placed This Encounter  Procedures  . Lipid Panel With LDL/HDL Ratio  . Basic Metabolic Panel (BMET)    Meds ordered this encounter  Medications  . digoxin (LANOXIN) 0.125 MG tablet    Sig: Take 1 tablet (0.125 mg total) by mouth daily.    Dispense:  90 tablet    Refill:  1  . donepezil (ARICEPT) 5 MG tablet    Sig: TAKE 1 TABLET BY MOUTH ONCE DAILY FOR MEMORY    Dispense:  90 tablet    Refill:  2  . atorvastatin (LIPITOR) 10 MG tablet    Sig: Take 1 tablet (10 mg total) by mouth every evening.    Dispense:  90 tablet    Refill:  1  .  furosemide (LASIX) 20 MG tablet    Sig: Take 1 tablet (20 mg total) by mouth daily.    Dispense:  90 tablet    Refill:  0  . citalopram (CELEXA) 40 MG tablet    Sig: Take 1 tablet (40 mg total) by mouth daily.    Dispense:  90 tablet    Refill:  1  . pantoprazole (PROTONIX) 40 MG tablet    Sig: Take 1 tablet (40 mg total) by mouth 2 (two) times daily.    Dispense:  180 tablet    Refill:  1  . rOPINIRole (REQUIP) 0.5 MG tablet    Sig: Take 1 tablet (0.5 mg total) by mouth every evening.    Dispense:  90 tablet    Refill:  1  . warfarin (COUMADIN) 4 MG tablet    Sig: Take 1 and 1/2 tab po daily except for Monday and friday    Dispense:  145 tablet    Refill:  3  . loratadine (CLARITIN) 10 MG tablet    Sig: Take 1 tablet  (10 mg total) by mouth daily.    Dispense:  30 tablet    Refill:  6  . amoxicillin-clavulanate (AUGMENTIN) 875-125 MG tablet    Sig: Take 1 tablet by mouth 2 (two) times daily.    Dispense:  14 tablet    Refill:  0    Time spent: 30 Minutes   This patient was seen by Orson Gear AGNP-C in Collaboration with Dr Lavera Guise as a part of collaborative care agreement     Kendell Bane AGNP-C Internal medicine

## 2019-10-16 NOTE — Telephone Encounter (Signed)
INR WAS GIVEN TO PROVIDER TO REVIEW. HER INR WAS 1.9 AND OUT OF RANGE. PROVIDER SAID WAS OK AND TO MONITOR AGAIN IN 1 WEEK. 10-16-19. INR RESULT SINGED AND PLACED IN SCAN.

## 2019-10-27 ENCOUNTER — Emergency Department: Payer: Medicare HMO

## 2019-10-27 ENCOUNTER — Other Ambulatory Visit: Payer: Self-pay

## 2019-10-27 ENCOUNTER — Encounter: Payer: Self-pay | Admitting: Emergency Medicine

## 2019-10-27 ENCOUNTER — Emergency Department
Admission: EM | Admit: 2019-10-27 | Discharge: 2019-10-27 | Disposition: A | Discharge status: Home / Self Care | Payer: Medicare HMO | Attending: Student in an Organized Health Care Education/Training Program | Admitting: Student in an Organized Health Care Education/Training Program

## 2019-10-27 DIAGNOSIS — R109 Unspecified abdominal pain: Secondary | ICD-10-CM | POA: Diagnosis not present

## 2019-10-27 DIAGNOSIS — E1122 Type 2 diabetes mellitus with diabetic chronic kidney disease: Secondary | ICD-10-CM | POA: Insufficient documentation

## 2019-10-27 DIAGNOSIS — E871 Hypo-osmolality and hyponatremia: Secondary | ICD-10-CM | POA: Diagnosis not present

## 2019-10-27 DIAGNOSIS — R1084 Generalized abdominal pain: Secondary | ICD-10-CM | POA: Insufficient documentation

## 2019-10-27 DIAGNOSIS — I10 Essential (primary) hypertension: Secondary | ICD-10-CM | POA: Diagnosis not present

## 2019-10-27 DIAGNOSIS — N184 Chronic kidney disease, stage 4 (severe): Secondary | ICD-10-CM | POA: Diagnosis not present

## 2019-10-27 DIAGNOSIS — I5042 Chronic combined systolic (congestive) and diastolic (congestive) heart failure: Secondary | ICD-10-CM | POA: Insufficient documentation

## 2019-10-27 DIAGNOSIS — Z79899 Other long term (current) drug therapy: Secondary | ICD-10-CM | POA: Insufficient documentation

## 2019-10-27 DIAGNOSIS — F17210 Nicotine dependence, cigarettes, uncomplicated: Secondary | ICD-10-CM | POA: Diagnosis not present

## 2019-10-27 DIAGNOSIS — I130 Hypertensive heart and chronic kidney disease with heart failure and stage 1 through stage 4 chronic kidney disease, or unspecified chronic kidney disease: Secondary | ICD-10-CM | POA: Diagnosis not present

## 2019-10-27 DIAGNOSIS — R112 Nausea with vomiting, unspecified: Secondary | ICD-10-CM | POA: Diagnosis not present

## 2019-10-27 DIAGNOSIS — Z794 Long term (current) use of insulin: Secondary | ICD-10-CM | POA: Insufficient documentation

## 2019-10-27 LAB — CBC
HCT: 33.6 % — ABNORMAL LOW (ref 36.0–46.0)
Hemoglobin: 11.1 g/dL — ABNORMAL LOW (ref 12.0–15.0)
MCH: 30.5 pg (ref 26.0–34.0)
MCHC: 33 g/dL (ref 30.0–36.0)
MCV: 92.3 fL (ref 80.0–100.0)
Platelets: 230 10*3/uL (ref 150–400)
RBC: 3.64 MIL/uL — ABNORMAL LOW (ref 3.87–5.11)
RDW: 14.5 % (ref 11.5–15.5)
WBC: 11.5 10*3/uL — ABNORMAL HIGH (ref 4.0–10.5)
nRBC: 0 % (ref 0.0–0.2)

## 2019-10-27 LAB — COMPREHENSIVE METABOLIC PANEL
ALT: 13 U/L (ref 0–44)
AST: 22 U/L (ref 15–41)
Albumin: 3.2 g/dL — ABNORMAL LOW (ref 3.5–5.0)
Alkaline Phosphatase: 66 U/L (ref 38–126)
Anion gap: 6 (ref 5–15)
BUN: 24 mg/dL — ABNORMAL HIGH (ref 8–23)
CO2: 29 mmol/L (ref 22–32)
Calcium: 8.5 mg/dL — ABNORMAL LOW (ref 8.9–10.3)
Chloride: 94 mmol/L — ABNORMAL LOW (ref 98–111)
Creatinine, Ser: 1.61 mg/dL — ABNORMAL HIGH (ref 0.44–1.00)
GFR calc Af Amer: 35 mL/min — ABNORMAL LOW (ref >60)
GFR calc non Af Amer: 30 mL/min — ABNORMAL LOW (ref >60)
Glucose, Bld: 108 mg/dL — ABNORMAL HIGH (ref 70–99)
Potassium: 4 mmol/L (ref 3.5–5.1)
Sodium: 129 mmol/L — ABNORMAL LOW (ref 135–145)
Total Bilirubin: 0.6 mg/dL (ref 0.3–1.2)
Total Protein: 7.2 g/dL (ref 6.5–8.1)

## 2019-10-27 LAB — URINALYSIS, COMPLETE (UACMP) WITH MICROSCOPIC
Bacteria, UA: NONE SEEN
Bilirubin Urine: NEGATIVE
Glucose, UA: NEGATIVE mg/dL
Ketones, ur: NEGATIVE mg/dL
Leukocytes,Ua: NEGATIVE
Nitrite: NEGATIVE
Protein, ur: 100 mg/dL — AB
Specific Gravity, Urine: 1.003 — ABNORMAL LOW (ref 1.005–1.030)
WBC, UA: NONE SEEN WBC/hpf (ref 0–5)
pH: 6 (ref 5.0–8.0)

## 2019-10-27 LAB — LIPASE, BLOOD: Lipase: 46 U/L (ref 11–51)

## 2019-10-27 MED ORDER — SODIUM CHLORIDE 1 G PO TABS: 1.0000 g | ORAL_TABLET | Freq: Two times a day (BID) | ORAL | 0 refills | Status: AC

## 2019-10-27 MED ORDER — DULCOLAX 5 MG PO TBEC: 5.0000 mg | DELAYED_RELEASE_TABLET | Freq: Every day | ORAL | 1 refills | Status: DC | PRN

## 2019-10-27 MED ORDER — SODIUM CHLORIDE 1 G PO TABS: 1.0000 g | ORAL_TABLET | Freq: Two times a day (BID) | ORAL | 0 refills | Status: DC

## 2019-10-27 MED ORDER — SODIUM CHLORIDE 0.9 % IV BOLUS
500.0000 mL | Freq: Once | INTRAVENOUS | Status: AC
  Administered 2019-10-27: 500 mL via INTRAVENOUS

## 2019-10-27 NOTE — ED Notes (Signed)

## 2019-10-27 NOTE — Discharge Instructions (Signed)
Your sodium level today is 129.  Please follow up with PCP this week for repeat blood work.  Return for any difficulty keeping food or liquids down or for fever or weakness.

## 2019-10-27 NOTE — ED Provider Notes (Signed)
[facility] Emergency Department Provider Note    First MD Initiated Contact with Patient 10/27/19 1634     (approximate)  I have reviewed the triage vital signs and the nursing notes.   HISTORY  Chief Complaint Abdominal Pain    HPI [name] is a 79 y.o. female   with plosive past medical history presents the ER for right-sided abdominal pain and swelling decreased oral intake.  She states that she is having trouble moving her bowels as well as passing gas.  Has had complex past medical history status post nephrectomy and status post cholecystectomy.  Not having measured fevers.  Denies any dysuria.  Does have a history of constipation.  Denies any chest pain or shortness of breath.   Past Medical History:  Diagnosis Date  . Atrial fibrillation ([place])   . Congestive heart failure (CHF) ([place])   . Diabetes mellitus without complication ([place])   . GERD (gastroesophageal reflux disease)   . Hyperlipidemia   . Hypertension    Family History  Problem Relation Age of Onset  . Breast cancer Mother    Past Surgical History:  Procedure Laterality Date  . ABDOMINAL HYSTERECTOMY    . APPENDECTOMY    . CATARACT EXTRACTION    . CHOLECYSTECTOMY    . HERNIA REPAIR    . right knee replacement    . right nephroectomy     Patient Active Problem List   Diagnosis Date Noted  . Uncontrolled type 2 diabetes mellitus with hyperglycemia ([place]) 05/20/2019  . Acute recurrent pansinusitis 05/11/2019  . Acute otitis media 05/11/2019  . Acute pain of left shoulder 05/11/2019  . Long term (current) use of anticoagulants 04/03/2019  . Chronic left shoulder pain 03/18/2019  . Sepsis ([place]) 01/12/2018  . Colitis 01/12/2018  . CKD (chronic kidney disease), stage III 01/12/2018  . Diabetes ([place]) 12/28/2017  . AF (paroxysmal atrial fibrillation) ([place]) 12/28/2017  . Encounter for general adult medical examination with abnormal findings 12/28/2017  . Primary generalized  (osteo)arthritis 12/28/2017  . Hypertension 12/27/2017  . GERD (gastroesophageal reflux disease) 12/27/2017  . Hematuria 03/18/2015  . Chronic combined systolic and diastolic heart failure ([place]) 03/01/2012  . Obesity 03/01/2012  . Chronic kidney disease, stage IV (severe) ([place]) 03/01/2012  . Other benign neoplasm of connective and other soft tissue of lower limb, including hip 03/15/2011  . Neoplasm of connective tissue 02/01/2011  . Pain in joint, pelvic region and thigh 02/01/2011  . Type II diabetes mellitus ([place]) 10/03/2006  . Encounter for current long-term use of anticoagulants 02/07/2006  . Essential hypertension 09/24/2004  . Atrial fibrillation ([place]) 06/07/2004      Prior to Admission medications   Medication Sig Start Date End Date Taking? Authorizing Provider  ACCU-CHEK FASTCLIX LANCETS [place]  05/26/17   [provider]  acetaminophen-codeine (TYLENOL #3) 300-30 MG tablet Take 1 tablet by mouth every 6 (six) hours as needed for moderate pain. 03/18/19   Ronnell Freshwater, NP  Alcohol Swabs (B-D SINGLE USE SWABS REGULAR) PADS USE AS DIRECTED TWICE DAILY 09/04/19   Kendell Bane, NP  amoxicillin-clavulanate (AUGMENTIN) 875-125 MG tablet Take 1 tablet by mouth 2 (two) times daily. 10/16/19   Kendell Bane, NP  atorvastatin (LIPITOR) 10 MG tablet Take 1 tablet (10 mg total) by mouth every evening. 10/16/19   Kendell Bane, NP  bisacodyl (DULCOLAX) 5 MG EC tablet Take 1 tablet (5 mg total) by mouth daily as needed for moderate constipation.  10/27/19 10/26/20  Merlyn Lot, MD  citalopram (CELEXA) 40 MG tablet Take 1 tablet (40 mg total) by mouth daily. 10/16/19   Kendell Bane, NP  digoxin (LANOXIN) 0.125 MG tablet Take 1 tablet (0.125 mg total) by mouth daily. 10/16/19   Kendell Bane, NP  donepezil (ARICEPT) 5 MG tablet TAKE 1 TABLET BY MOUTH ONCE DAILY FOR MEMORY 10/16/19   Kendell Bane, NP  fluticasone (FLONASE) 50 MCG/ACT nasal spray Place 2 sprays  into both nostrils daily. 04/24/19   Lavera Guise, MD  furosemide (LASIX) 20 MG tablet Take 1 tablet (20 mg total) by mouth daily. 10/16/19   Kendell Bane, NP  glimepiride (AMARYL) 2 MG tablet Take 2 mg by mouth daily with breakfast.    [provider]  glucose blood (ACCU-CHEK GUIDE) test strip  07/01/17   [provider]  glucose blood test strip Use as instructed to check blood sugars twice a day.  E11.65 10/31/17   Lavera Guise, MD  Insulin Glargine 300 UNIT/ML SOPN Inject 14 Units into the skin at bedtime. 01/05/19   Vaughan Basta, MD  Insulin Pen Needle (BD PEN NEEDLE NANO U/F) 32G X 4 MM MISC Use as directed 08/11/19   Ronnell Freshwater, NP  lidocaine (LIDODERM) 5 % Place 1 patch onto the skin every 12 (twelve) hours. Remove & Discard patch within 12 hours or as directed by MD 11/21/18 11/21/19  Sable Feil, PA-C  loratadine (CLARITIN) 10 MG tablet Take 1 tablet (10 mg total) by mouth daily. 10/16/19   Kendell Bane, NP  metoCLOPramide (REGLAN) 5 MG tablet Take one tab po bid for nausea and one at bed time prn 01/30/18   Lavera Guise, MD  nitrofurantoin, macrocrystal-monohydrate, (MACROBID) 100 MG capsule Take 1 capsule (100 mg total) by mouth 2 (two) times daily. 09/02/19   Kendell Bane, NP  pantoprazole (PROTONIX) 40 MG tablet Take 1 tablet (40 mg total) by mouth 2 (two) times daily. 10/16/19   Kendell Bane, NP  rOPINIRole (REQUIP) 0.5 MG tablet Take 1 tablet (0.5 mg total) by mouth every evening. 10/16/19   Kendell Bane, NP  sodium chloride 1 g tablet Take 1 tablet (1 g total) by mouth 2 (two) times daily with a meal for 4 days. 10/27/19 10/31/19  Merlyn Lot, MD  sulfamethoxazole-trimethoprim (BACTRIM DS) 800-160 MG tablet Take 1 tablet by mouth 2 (two) times daily. 05/02/19   Ronnell Freshwater, NP  tizanidine (ZANAFLEX) 2 MG capsule  11/12/14   [provider]  traMADol (ULTRAM) 50 MG tablet Take 1 tablet (50 mg total) by mouth 2 (two)  times daily as needed. for pain 09/02/19   Kendell Bane, NP  VICTOZA 18 MG/3ML SOPN INJECT 1.2 MG INTO THE SKIN DAILY AS DIRECTED  04/11/19   Ronnell Freshwater, NP  Vitamin D, Ergocalciferol, (DRISDOL) 1.25 MG (50000 UT) CAPS capsule Take 1 capsule by mouth once a week 03/31/19   Lavera Guise, MD  warfarin (COUMADIN) 4 MG tablet Take 1 and 1/2 tab po daily except for Monday and friday 10/16/19   Kendell Bane, NP    Allergies Patient has no known allergies.    Social History Social History   Tobacco Use  . Smoking status: Current Every Day Smoker    Packs/day: 1.00    Types: Cigarettes    Last attempt to quit: 01/03/2019    Years since quitting: 0.8  . Smokeless tobacco:  Never Used  Substance Use Topics  . Alcohol use: No  . Drug use: No    Review of Systems Patient denies headaches, rhinorrhea, blurry vision, numbness, shortness of breath, chest pain, edema, cough, abdominal pain, nausea, vomiting, diarrhea, dysuria, fevers, rashes or hallucinations unless otherwise stated above in HPI. ____________________________________________   PHYSICAL EXAM:  VITAL SIGNS: Vitals:   10/27/19 1555 10/27/19 1850  BP: (!) 141/46 (!) 140/55  Pulse: 67 85  Resp: 18 16  Temp: 98.8 F (37.1 C)   SpO2: 99% 100%    Constitutional: Alert and oriented.  Eyes: Conjunctivae are normal.  Head: Atraumatic. Nose: No congestion/rhinnorhea. Mouth/Throat: Mucous membranes are moist.   Neck: No stridor. Painless ROM.  Cardiovascular: Normal rate, regular rhythm. Grossly normal heart sounds.  Good peripheral circulation. Respiratory: Normal respiratory effort.  No retractions. Lungs CTAB. Gastrointestinal: Soft right abdominal wall hernia without overlying erythema,  Nontender. No distention. No abdominal bruits. No CVA tenderness. Genitourinary:  Musculoskeletal: No lower extremity tenderness nor edema.  No joint effusions. Neurologic:  Normal speech and language. No gross focal  neurologic deficits are appreciated. No facial droop Skin:  Skin is warm, dry and intact. No rash noted. Psychiatric: Mood and affect are normal. Speech and behavior are normal.  ____________________________________________   LABS (all labs ordered are listed, but only abnormal results are displayed)  Results for orders placed or performed during the hospital encounter of 10/27/19 (from the past 24 hour(s))  Urinalysis, Complete w Microscopic     Status: Abnormal   Collection Time: 10/27/19  3:52 PM  Result Value Ref Range   Color, Urine STRAW (A) YELLOW   APPearance CLEAR (A) CLEAR   Specific Gravity, Urine 1.003 (L) 1.005 - 1.030   pH 6.0 5.0 - 8.0   Glucose, UA NEGATIVE NEGATIVE mg/dL   Hgb urine dipstick MODERATE (A) NEGATIVE   Bilirubin Urine NEGATIVE NEGATIVE   Ketones, ur NEGATIVE NEGATIVE mg/dL   Protein, ur 100 (A) NEGATIVE mg/dL   Nitrite NEGATIVE NEGATIVE   Leukocytes,Ua NEGATIVE NEGATIVE   RBC / HPF 0-5 0 - 5 RBC/hpf   WBC, UA NONE SEEN 0 - 5 WBC/hpf   Bacteria, UA NONE SEEN NONE SEEN   Squamous Epithelial / LPF 0-5 0 - 5   Mucus PRESENT   Lipase, blood     Status: None   Collection Time: 10/27/19  4:44 PM  Result Value Ref Range   Lipase 46 11 - 51 U/L  Comprehensive metabolic panel     Status: Abnormal   Collection Time: 10/27/19  4:44 PM  Result Value Ref Range   Sodium 129 (L) 135 - 145 mmol/L   Potassium 4.0 3.5 - 5.1 mmol/L   Chloride 94 (L) 98 - 111 mmol/L   CO2 29 22 - 32 mmol/L   Glucose, Bld 108 (H) 70 - 99 mg/dL   BUN 24 (H) 8 - 23 mg/dL   Creatinine, Ser 1.61 (H) 0.44 - 1.00 mg/dL   Calcium 8.5 (L) 8.9 - 10.3 mg/dL   Total Protein 7.2 6.5 - 8.1 g/dL   Albumin 3.2 (L) 3.5 - 5.0 g/dL   AST 22 15 - 41 U/L   ALT 13 0 - 44 U/L   Alkaline Phosphatase 66 38 - 126 U/L   Total Bilirubin 0.6 0.3 - 1.2 mg/dL   GFR calc non Af Amer 30 (L) >60 mL/min   GFR calc Af Amer 35 (L) >60 mL/min   Anion gap 6 5 - 15  CBC     Status: Abnormal   Collection Time:  10/27/19  4:44 PM  Result Value Ref Range   WBC 11.5 (H) 4.0 - 10.5 K/uL   RBC 3.64 (L) 3.87 - 5.11 MIL/uL   Hemoglobin 11.1 (L) 12.0 - 15.0 g/dL   HCT 33.6 (L) 36.0 - 46.0 %   MCV 92.3 80.0 - 100.0 fL   MCH 30.5 26.0 - 34.0 pg   MCHC 33.0 30.0 - 36.0 g/dL   RDW 14.5 11.5 - 15.5 %   Platelets 230 150 - 400 K/uL   nRBC 0.0 0.0 - 0.2 %   ____________________________________________  EKG My review and personal interpretation at Time: 15:55   Indication: abd pain  Rate: 70  Rhythm: sinus Axis: normal Other: normal intervals, no stemi ____________________________________________  RADIOLOGY  I personally reviewed all radiographic images ordered to evaluate for the above acute complaints and reviewed radiology reports and findings.  These findings were personally discussed with the patient.  Please see medical record for radiology report.  ____________________________________________   PROCEDURES  Procedure(s) performed:  Procedures    Critical Care performed: no ____________________________________________   INITIAL IMPRESSION / ASSESSMENT AND PLAN / ED COURSE  Pertinent labs & imaging results that were available during my care of the patient were reviewed by me and considered in my medical decision making (see chart for details).   DDX: colitis, sbo, mass, hernia, diverticulitis, pancreatitis,  PEARLEAN SABINA is a 79 y.o. who presents to the ED with symptoms as described above.  Blood work and CT imaging will be ordered for by differential.  CT imaging without any evidence of acute intra-abdominal process.  Evidence of acute cystitis.  Sodium was borderline low but patient asymptomatic without any weakness and able to ambulate steady gait.  And given small bolus of IV fluids and patient has close outpatient follow-up with PCP this week for repeat blood check.  Have discussed with the patient and available family all diagnostics and treatments performed thus far and all  questions were answered to the best of my ability. The patient demonstrates understanding and agreement with plan.      The patient was evaluated in Emergency Department today for the symptoms described in the history of present illness. He/she was evaluated in the context of the global COVID-19 pandemic, which necessitated consideration that the patient might be at risk for infection with the SARS-CoV-2 virus that causes COVID-19. Institutional protocols and algorithms that pertain to the evaluation of patients at risk for COVID-19 are in a state of rapid change based on information released by regulatory bodies including the CDC and federal and state organizations. These policies and algorithms were followed during the patient's care in the ED.  As part of my medical decision making, I reviewed the following data within the [place] notes reviewed and incorporated, Labs reviewed, notes from prior ED visits and [place] Controlled Substance Database   ____________________________________________   FINAL CLINICAL IMPRESSION(S) / ED DIAGNOSES  Final diagnoses:  Generalized abdominal pain  Low sodium levels      NEW MEDICATIONS STARTED DURING THIS VISIT:  Discharge Medication List as of 10/27/2019  6:45 PM       Note:  This document was prepared using Dragon voice recognition software and may include unintentional dictation errors.    Merlyn Lot, MD 10/27/19 2129

## 2019-10-27 NOTE — ED Triage Notes (Signed)
Here for what patient reports is a knot on right abdomen.  Painful to touch. This RN does not appreciate a knot where patient c/o pain.  When abdomen palpated did c/o pain. No vomiting or fever.  No bowel movement since last Tuesday.  Has tried 2 laxatives per pt. No chest pain or SHOB.

## 2019-10-28 ENCOUNTER — Telehealth: Payer: Self-pay

## 2019-10-28 NOTE — Telephone Encounter (Signed)
Called lmom informing patient of appointment. klh 

## 2019-10-30 ENCOUNTER — Ambulatory Visit: Payer: Medicare HMO | Admitting: Adult Health

## 2019-11-03 ENCOUNTER — Ambulatory Visit (INDEPENDENT_AMBULATORY_CARE_PROVIDER_SITE_OTHER): Payer: Medicare HMO

## 2019-11-03 ENCOUNTER — Other Ambulatory Visit: Payer: Self-pay

## 2019-11-03 DIAGNOSIS — Z7901 Long term (current) use of anticoagulants: Secondary | ICD-10-CM | POA: Diagnosis not present

## 2019-11-03 NOTE — Progress Notes (Signed)
Pt inr 2.8 continue same med as per adam

## 2019-11-05 DIAGNOSIS — Z008 Encounter for other general examination: Secondary | ICD-10-CM

## 2019-11-18 ENCOUNTER — Ambulatory Visit (INDEPENDENT_AMBULATORY_CARE_PROVIDER_SITE_OTHER): Payer: Medicare HMO

## 2019-11-18 DIAGNOSIS — Z7901 Long term (current) use of anticoagulants: Secondary | ICD-10-CM | POA: Diagnosis not present

## 2019-11-18 NOTE — Progress Notes (Signed)
PT INR WAS 3.7. PER ADAM CALLED PT DAUGHTER AND ADVISED HER TO HAVE PT NOT TAKE DOSE ON COUMADIN TODAY AND TOMORROW, THEN CONTINUE DOSE AS NORMAL.

## 2019-12-03 ENCOUNTER — Ambulatory Visit (INDEPENDENT_AMBULATORY_CARE_PROVIDER_SITE_OTHER): Payer: Medicare HMO

## 2019-12-03 DIAGNOSIS — Z7901 Long term (current) use of anticoagulants: Secondary | ICD-10-CM | POA: Diagnosis not present

## 2019-12-03 NOTE — Progress Notes (Signed)
Pt INR 1.8 as per dr Humphrey Rolls pt daughter advised that take 1 extra coumadin today and go back normal and keep follow up coming up

## 2019-12-09 ENCOUNTER — Telehealth: Payer: Self-pay

## 2019-12-09 NOTE — Telephone Encounter (Signed)
Confirmed appointment on 12/11/2019 and screened for covid. [facility]

## 2019-12-11 ENCOUNTER — Ambulatory Visit: Payer: Medicare HMO | Admitting: Adult Health

## 2019-12-15 ENCOUNTER — Telehealth: Payer: Self-pay

## 2019-12-15 NOTE — Telephone Encounter (Signed)
Confirmed appointment on 12/17/2019 and screened for covid. [facility]

## 2019-12-16 ENCOUNTER — Ambulatory Visit (INDEPENDENT_AMBULATORY_CARE_PROVIDER_SITE_OTHER): Payer: Medicare HMO

## 2019-12-16 DIAGNOSIS — Z7901 Long term (current) use of anticoagulants: Secondary | ICD-10-CM | POA: Diagnosis not present

## 2019-12-16 NOTE — Progress Notes (Signed)
Pt INR 2.9  conitinue as per adam same med as prescribed

## 2019-12-17 ENCOUNTER — Ambulatory Visit (INDEPENDENT_AMBULATORY_CARE_PROVIDER_SITE_OTHER): Payer: Medicare HMO | Admitting: Adult Health

## 2019-12-17 ENCOUNTER — Other Ambulatory Visit: Payer: Self-pay

## 2019-12-17 ENCOUNTER — Encounter: Payer: Self-pay | Admitting: Adult Health

## 2019-12-17 VITALS — BP 126/47 | HR 88 | Temp 97.4°F | Resp 16 | Ht <= 58 in | Wt 118.8 lb

## 2019-12-17 DIAGNOSIS — L84 Corns and callosities: Secondary | ICD-10-CM

## 2019-12-17 DIAGNOSIS — I10 Essential (primary) hypertension: Secondary | ICD-10-CM

## 2019-12-17 DIAGNOSIS — E11649 Type 2 diabetes mellitus with hypoglycemia without coma: Secondary | ICD-10-CM | POA: Diagnosis not present

## 2019-12-17 DIAGNOSIS — K219 Gastro-esophageal reflux disease without esophagitis: Secondary | ICD-10-CM

## 2019-12-17 DIAGNOSIS — R110 Nausea: Secondary | ICD-10-CM

## 2019-12-17 DIAGNOSIS — E1165 Type 2 diabetes mellitus with hyperglycemia: Secondary | ICD-10-CM | POA: Diagnosis not present

## 2019-12-17 LAB — POCT GLYCOSYLATED HEMOGLOBIN (HGB A1C): Hemoglobin A1C: 6.7 % — AB (ref 4.0–5.6)

## 2019-12-17 MED ORDER — METOCLOPRAMIDE HCL 5 MG PO TABS: ORAL_TABLET | ORAL | 1 refills | Status: DC

## 2019-12-17 MED ORDER — TOUJEO MAX SOLOSTAR 300 UNIT/ML ~~LOC~~ SOPN: 14.0000 [IU] | PEN_INJECTOR | Freq: Every day | SUBCUTANEOUS | 2 refills | Status: DC

## 2019-12-17 MED ORDER — VICTOZA 18 MG/3ML ~~LOC~~ SOPN: 1.8000 mg | PEN_INJECTOR | Freq: Every day | SUBCUTANEOUS | 3 refills | Status: DC

## 2019-12-17 NOTE — Progress Notes (Signed)
[facility] [place], [place] [address]  Internal MEDICINE  Office Visit Note  Patient Name: [name]  [id number]  [id number]  Date of Service: 01/31/2020  Chief Complaint  Patient presents with  . Diabetes  . Hypertension  . Hyperlipidemia  . Gastroesophageal Reflux  . Toe Pain    corn on little toe on right foot, would like a referral   . Medication Refill    victoza, toujeo ( discontinue on 01/05/19 at [place] visit) do you want her back on it??, reglan, send to [facility]     HPI  Pt is here for follow up on HTN, HLD, DM and Gerd.   She has a corn on 5th toe, right foot. Would like to see podiatry.  Bp is currently controlled.  Denies Chest pain, Shortness of breath, palpitations, headache, or blurred vision.  Her DM is improving.  Her A1C today is 6.7 which is down from 7.7.  Her Jerrye Bushy is also well controlled.     Current Medication: Outpatient Encounter Medications as of 12/17/2019  Medication Sig  . ACCU-CHEK FASTCLIX LANCETS MISC   . acetaminophen-codeine (TYLENOL #3) 300-30 MG tablet Take 1 tablet by mouth every 6 (six) hours as needed for moderate pain.  . Alcohol Swabs (B-D SINGLE USE SWABS REGULAR) PADS USE AS DIRECTED TWICE DAILY  . atorvastatin (LIPITOR) 10 MG tablet Take 1 tablet (10 mg total) by mouth every evening.  . bisacodyl (DULCOLAX) 5 MG EC tablet Take 1 tablet (5 mg total) by mouth daily as needed for moderate constipation.  . citalopram (CELEXA) 40 MG tablet Take 1 tablet (40 mg total) by mouth daily.  . digoxin (LANOXIN) 0.125 MG tablet Take 1 tablet (0.125 mg total) by mouth daily.  Marland Kitchen donepezil (ARICEPT) 5 MG tablet TAKE 1 TABLET BY MOUTH ONCE DAILY FOR MEMORY  . fluticasone (FLONASE) 50 MCG/ACT nasal spray Place 2 sprays into both nostrils daily.  Marland Kitchen glucose blood (ACCU-CHEK GUIDE) test strip   . glucose blood test strip Use as instructed to check blood sugars twice a day.  E11.65  . Insulin Pen Needle (BD PEN NEEDLE NANO U/F)  32G X 4 MM MISC Use as directed  . liraglutide (VICTOZA) 18 MG/3ML SOPN Inject 0.3 mLs (1.8 mg total) into the skin daily.  Marland Kitchen loratadine (CLARITIN) 10 MG tablet Take 1 tablet (10 mg total) by mouth daily.  . metoCLOPramide (REGLAN) 5 MG tablet Take one tab po bid for nausea and one at bed time prn  . pantoprazole (PROTONIX) 40 MG tablet Take 1 tablet (40 mg total) by mouth 2 (two) times daily.  Marland Kitchen rOPINIRole (REQUIP) 0.5 MG tablet Take 1 tablet (0.5 mg total) by mouth every evening.  . traMADol (ULTRAM) 50 MG tablet Take 1 tablet (50 mg total) by mouth 2 (two) times daily as needed. for pain  . warfarin (COUMADIN) 4 MG tablet Take 1 and 1/2 tab po daily except for Monday and friday  . [DISCONTINUED] furosemide (LASIX) 20 MG tablet Take 1 tablet (20 mg total) by mouth daily.  . [DISCONTINUED] Insulin Glargine 300 UNIT/ML SOPN Inject 14 Units into the skin at bedtime.  . [DISCONTINUED] metoCLOPramide (REGLAN) 5 MG tablet Take one tab po bid for nausea and one at bed time prn  . [DISCONTINUED] VICTOZA 18 MG/3ML SOPN INJECT 1.2 MG INTO THE SKIN DAILY AS DIRECTED   . Insulin Glargine, 2 Unit Dial, (TOUJEO MAX SOLOSTAR) 300 UNIT/ML SOPN Inject 14 Units into the skin  daily.  . [DISCONTINUED] amoxicillin-clavulanate (AUGMENTIN) 875-125 MG tablet Take 1 tablet by mouth 2 (two) times daily. (Patient not taking: Reported on 12/17/2019)  . [DISCONTINUED] glimepiride (AMARYL) 2 MG tablet Take 2 mg by mouth daily with breakfast.  . [DISCONTINUED] nitrofurantoin, macrocrystal-monohydrate, (MACROBID) 100 MG capsule Take 1 capsule (100 mg total) by mouth 2 (two) times daily. (Patient not taking: Reported on 12/17/2019)  . [DISCONTINUED] sulfamethoxazole-trimethoprim (BACTRIM DS) 800-160 MG tablet Take 1 tablet by mouth 2 (two) times daily. (Patient not taking: Reported on 12/17/2019)  . [DISCONTINUED] tizanidine (ZANAFLEX) 2 MG capsule   . [DISCONTINUED] Vitamin D, Ergocalciferol, (DRISDOL) 1.25 MG (50000 UT) CAPS  capsule Take 1 capsule by mouth once a week (Patient not taking: Reported on 12/17/2019)   No facility-administered encounter medications on file as of 12/17/2019.    Surgical History: Past Surgical History:  Procedure Laterality Date  . ABDOMINAL HYSTERECTOMY    . APPENDECTOMY    . CATARACT EXTRACTION    . CHOLECYSTECTOMY    . HERNIA REPAIR    . right knee replacement    . right nephroectomy      Medical History: Past Medical History:  Diagnosis Date  . Atrial fibrillation ([place])   . Congestive heart failure (CHF) ([place])   . Diabetes mellitus without complication ([place])   . GERD (gastroesophageal reflux disease)   . Hyperlipidemia   . Hypertension     Family History: Family History  Problem Relation Age of Onset  . Breast cancer Mother     Social History   Socioeconomic History  . Marital status: Widowed    Spouse name: Not on file  . Number of children: Not on file  . Years of education: Not on file  . Highest education level: Not on file  Occupational History  . Not on file  Tobacco Use  . Smoking status: Current Every Day Smoker    Packs/day: 1.00    Types: Cigarettes    Last attempt to quit: 01/03/2019    Years since quitting: 1.0  . Smokeless tobacco: Never Used  Substance and Sexual Activity  . Alcohol use: No  . Drug use: No  . Sexual activity: Not on file  Other Topics Concern  . Not on file  Social History Narrative  . Not on file   Social Determinants of Health   Financial Resource Strain:   . Difficulty of Paying Living Expenses:   Food Insecurity:   . Worried About Charity fundraiser in the Last Year:   . Arboriculturist in the Last Year:   Transportation Needs:   . Film/video editor (Medical):   Marland Kitchen Lack of Transportation (Non-Medical):   Physical Activity:   . Days of Exercise per Week:   . Minutes of Exercise per Session:   Stress:   . Feeling of Stress :   Social Connections:   . Frequency of Communication with Friends and  Family:   . Frequency of Social Gatherings with Friends and Family:   . Attends Religious Services:   . Active Member of Clubs or Organizations:   . Attends Archivist Meetings:   Marland Kitchen Marital Status:   Intimate Partner Violence:   . Fear of Current or Ex-Partner:   . Emotionally Abused:   Marland Kitchen Physically Abused:   . Sexually Abused:       Review of Systems  Constitutional: Negative for chills, fatigue and unexpected weight change.  HENT: Negative for congestion, rhinorrhea, sneezing and sore  throat.   Eyes: Negative for photophobia, pain and redness.  Respiratory: Negative for cough, chest tightness and shortness of breath.   Cardiovascular: Negative for chest pain and palpitations.  Gastrointestinal: Negative for abdominal pain, constipation, diarrhea, nausea and vomiting.  Endocrine: Negative.   Genitourinary: Negative for dysuria and frequency.  Musculoskeletal: Negative for arthralgias, back pain, joint swelling and neck pain.  Skin: Negative for rash.  Allergic/Immunologic: Negative.   Neurological: Negative for tremors and numbness.  Hematological: Negative for adenopathy. Does not bruise/bleed easily.  Psychiatric/Behavioral: Negative for behavioral problems and sleep disturbance. The patient is not nervous/anxious.     Vital Signs: BP (!) 126/47   Pulse 88   Temp (!) 97.4 F (36.3 C)   Resp 16   Ht 4\' 10"  (1.473 m)   Wt 118 lb 12.8 oz (53.9 kg)   SpO2 97%   BMI 24.83 kg/m    Physical Exam Vitals and nursing note reviewed.  Constitutional:      General: She is not in acute distress.    Appearance: She is well-developed. She is not diaphoretic.  HENT:     Head: Normocephalic and atraumatic.     Mouth/Throat:     Pharynx: No oropharyngeal exudate.  Eyes:     Pupils: Pupils are equal, round, and reactive to light.  Neck:     Thyroid: No thyromegaly.     Vascular: No JVD.     Trachea: No tracheal deviation.  Cardiovascular:     Rate and Rhythm:  Normal rate and regular rhythm.     Heart sounds: Normal heart sounds. No murmur. No friction rub. No gallop.   Pulmonary:     Effort: Pulmonary effort is normal. No respiratory distress.     Breath sounds: Normal breath sounds. No wheezing or rales.  Chest:     Chest wall: No tenderness.  Abdominal:     Palpations: Abdomen is soft.     Tenderness: There is no abdominal tenderness. There is no guarding.  Musculoskeletal:        General: Normal range of motion.     Cervical back: Normal range of motion and neck supple.  Lymphadenopathy:     Cervical: No cervical adenopathy.  Skin:    General: Skin is warm and dry.  Neurological:     Mental Status: She is alert and oriented to person, place, and time.     Cranial Nerves: No cranial nerve deficit.  Psychiatric:        Behavior: Behavior normal.        Thought Content: Thought content normal.        Judgment: Judgment normal.     Assessment/Plan: 1. Uncontrolled type 2 diabetes mellitus with hypoglycemia, unspecified hypoglycemia coma status (HCC) A1C is improved to 6.7 today.  Continue toujeo and victoza.  Follow up in 3 months.  - POCT HgB A1C - Insulin Glargine, 2 Unit Dial, (TOUJEO MAX SOLOSTAR) 300 UNIT/ML SOPN; Inject 14 Units into the skin daily.  Dispense: 15 mL; Refill: 2 - liraglutide (VICTOZA) 18 MG/3ML SOPN; Inject 0.3 mLs (1.8 mg total) into the skin daily.  Dispense: 18 mL; Refill: 3  2. Corn of foot Has painful corn on right 5th toe. Requesting podiatry.  - Ambulatory referral to Podiatry  3. Gastroesophageal reflux disease without esophagitis Controlled, denies issues.   4. Essential hypertension Controlled, lower bp today.  Continue to monitor.  5. Nausea Continue to use Reglan as directed.  - metoCLOPramide (REGLAN) 5 MG tablet; Take  one tab po bid for nausea and one at bed time prn  Dispense: 45 tablet; Refill: 1  General Counseling: [name] verbalizes understanding of the findings of todays visit and  agrees with plan of treatment. I have discussed any further diagnostic evaluation that may be needed or ordered today. We also reviewed her medications today. she has been encouraged to call the office with any questions or concerns that should arise related to todays visit.    Orders Placed This Encounter  Procedures  . Ambulatory referral to Podiatry  . POCT HgB A1C    Meds ordered this encounter  Medications  . Insulin Glargine, 2 Unit Dial, (TOUJEO MAX SOLOSTAR) 300 UNIT/ML SOPN    Sig: Inject 14 Units into the skin daily.    Dispense:  15 mL    Refill:  2  . liraglutide (VICTOZA) 18 MG/3ML SOPN    Sig: Inject 0.3 mLs (1.8 mg total) into the skin daily.    Dispense:  18 mL    Refill:  3  . metoCLOPramide (REGLAN) 5 MG tablet    Sig: Take one tab po bid for nausea and one at bed time prn    Dispense:  45 tablet    Refill:  1    Time spent: 30 Minutes   This patient was seen by Orson Gear AGNP-C in Collaboration with Dr Lavera Guise as a part of collaborative care agreement     Kendell Bane AGNP-C Internal medicine

## 2019-12-19 ENCOUNTER — Other Ambulatory Visit: Payer: Self-pay | Admitting: Adult Health

## 2019-12-19 DIAGNOSIS — I5032 Chronic diastolic (congestive) heart failure: Secondary | ICD-10-CM

## 2020-01-05 ENCOUNTER — Ambulatory Visit (INDEPENDENT_AMBULATORY_CARE_PROVIDER_SITE_OTHER): Payer: Medicare HMO

## 2020-01-05 DIAGNOSIS — Z7901 Long term (current) use of anticoagulants: Secondary | ICD-10-CM | POA: Diagnosis not present

## 2020-01-05 NOTE — Progress Notes (Signed)
Pt INr 3.3 as per adam pt daughter advised skip dose for today and continue same as prescribed

## 2020-01-15 ENCOUNTER — Encounter: Payer: Self-pay | Admitting: Podiatry

## 2020-01-15 ENCOUNTER — Ambulatory Visit: Payer: Medicare HMO | Admitting: Podiatry

## 2020-01-15 ENCOUNTER — Ambulatory Visit (INDEPENDENT_AMBULATORY_CARE_PROVIDER_SITE_OTHER): Payer: Medicare HMO

## 2020-01-15 ENCOUNTER — Other Ambulatory Visit: Payer: Self-pay

## 2020-01-15 VITALS — BP 147/62 | HR 74 | Temp 97.5°F

## 2020-01-15 DIAGNOSIS — Z7901 Long term (current) use of anticoagulants: Secondary | ICD-10-CM

## 2020-01-15 DIAGNOSIS — Z794 Long term (current) use of insulin: Secondary | ICD-10-CM | POA: Diagnosis not present

## 2020-01-15 DIAGNOSIS — E1151 Type 2 diabetes mellitus with diabetic peripheral angiopathy without gangrene: Secondary | ICD-10-CM | POA: Diagnosis not present

## 2020-01-15 DIAGNOSIS — E1149 Type 2 diabetes mellitus with other diabetic neurological complication: Secondary | ICD-10-CM

## 2020-01-15 DIAGNOSIS — I739 Peripheral vascular disease, unspecified: Secondary | ICD-10-CM | POA: Diagnosis not present

## 2020-01-15 NOTE — Progress Notes (Signed)
Pt INR 3.1 as per continue same med as prescribe

## 2020-01-15 NOTE — Progress Notes (Signed)
Complaint:  Visit Type: Patient presents  to my office for continued preventative foot care services. Complaint: Patient states she had a painful corn on her fifth toe right boot days ago.  Her daughter says she applied mineral oil to the corn and the corn fell off. Patient has been diagnosed with DM , CKD  And coagulation defect.  Patient is taking coumadin.. The patient presents for preventative foot care services.  Podiatric Exam: Vascular: dorsalis pedis and posterior tibial pulses are not palpable bilateral. Capillary return is immediate. Cold feet.. Skin turgor WNL  Sensorium: AbsentSemmes Weinstein monofilament test. Normal tactile sensation bilaterally. Nail Exam: Pt has thick disfigured discolored nails with subungual debris noted bilateral entire nail hallux through fifth toenails.  Patient has nail polish on her nails Ulcer Exam: There is no evidence of ulcer or pre-ulcerative changes or infection. Orthopedic Exam: Muscle tone and strength are WNL. No limitations in general ROM. No crepitus or effusions noted. Foot type and digits show no abnormalities. HAV  B/L.  Midfoot DJD  B/L. Skin: No Porokeratosis. No infection or ulcers  Diagnosis: Diabetes with vascular disease and neuropathy.  Treatment & Plan Procedures and Treatment: Consent by patient was obtained for treatment procedures. Diabetic foot exam reveals both vascular and neurological pathology. Return Visit-Office Procedure: Patient instructed to return to the office for a follow up visit 3 months for continued evaluation and treatment.    Gardiner Barefoot DPM

## 2020-02-04 ENCOUNTER — Ambulatory Visit (INDEPENDENT_AMBULATORY_CARE_PROVIDER_SITE_OTHER): Payer: Medicare HMO

## 2020-02-04 DIAGNOSIS — Z7901 Long term (current) use of anticoagulants: Secondary | ICD-10-CM | POA: Diagnosis not present

## 2020-02-04 NOTE — Progress Notes (Signed)
Pt  INR 2.4 as per heather continue as per heather continue same

## 2020-02-12 ENCOUNTER — Encounter: Payer: Self-pay | Admitting: Podiatry

## 2020-02-12 ENCOUNTER — Ambulatory Visit: Payer: Medicare HMO | Admitting: Podiatry

## 2020-02-12 ENCOUNTER — Ambulatory Visit (INDEPENDENT_AMBULATORY_CARE_PROVIDER_SITE_OTHER): Payer: Medicare HMO

## 2020-02-12 ENCOUNTER — Other Ambulatory Visit: Payer: Self-pay

## 2020-02-12 VITALS — Temp 98.1°F

## 2020-02-12 DIAGNOSIS — M79675 Pain in left toe(s): Secondary | ICD-10-CM

## 2020-02-12 DIAGNOSIS — M79674 Pain in right toe(s): Secondary | ICD-10-CM | POA: Diagnosis not present

## 2020-02-12 DIAGNOSIS — B351 Tinea unguium: Secondary | ICD-10-CM

## 2020-02-12 DIAGNOSIS — Z7901 Long term (current) use of anticoagulants: Secondary | ICD-10-CM | POA: Diagnosis not present

## 2020-02-12 DIAGNOSIS — E1149 Type 2 diabetes mellitus with other diabetic neurological complication: Secondary | ICD-10-CM | POA: Diagnosis not present

## 2020-02-12 DIAGNOSIS — N184 Chronic kidney disease, stage 4 (severe): Secondary | ICD-10-CM

## 2020-02-12 DIAGNOSIS — Z794 Long term (current) use of insulin: Secondary | ICD-10-CM

## 2020-02-12 NOTE — Progress Notes (Signed)
This patient returns to my office for at risk foot care.  This patient requires this care by a professional since this patient will be at risk due to having diabetes, chronic kidney disease and coagulation defect. Patient is taking coumadin. This patient is unable to cut nails himself since the patient cannot reach his nails.These nails are painful walking and wearing shoes.  This patient presents for at risk foot care today.  General Appearance  Alert, conversant and in no acute stress.  Vascular  Dorsalis pedis and posterior tibial  pulses are not  palpable  bilaterally.  Capillary return is within normal limits  bilaterally. Temperature is within normal limits  bilaterally.  Neurologic  Senn-Weinstein monofilament wire test absent   bilaterally. Muscle power within normal limits bilaterally.  Nails Thick disfigured discolored nails with subungual debris  from hallux to fifth toes bilaterally. No evidence of bacterial infection or drainage bilaterally.  Orthopedic  No limitations of motion  feet .  No crepitus or effusions noted.  No bony pathology or digital deformities noted.  Skin  normotropic skin with no porokeratosis noted bilaterally.  No signs of infections or ulcers noted.     Onychomycosis  Pain in right toes  Pain in left toes  Consent was obtained for treatment procedures.   Mechanical debridement of nails 1-5  bilaterally performed with a nail nipper.  Filed with dremel without incident.    Return office visit   3 months                   Told patient to return for periodic foot care and evaluation due to potential at risk complications.   Gardiner Barefoot DPM

## 2020-02-12 NOTE — Progress Notes (Signed)
Pt INR 3.6 as per adam advised pt and her her daughter that Skip today and go back to normal

## 2020-02-19 ENCOUNTER — Ambulatory Visit (INDEPENDENT_AMBULATORY_CARE_PROVIDER_SITE_OTHER): Payer: Medicare HMO

## 2020-02-19 DIAGNOSIS — Z7901 Long term (current) use of anticoagulants: Secondary | ICD-10-CM

## 2020-02-19 NOTE — Progress Notes (Signed)
Pt INR 2.8 as per heather continue same med as prescribe

## 2020-02-23 DIAGNOSIS — Z7901 Long term (current) use of anticoagulants: Secondary | ICD-10-CM | POA: Diagnosis not present

## 2020-02-25 ENCOUNTER — Ambulatory Visit (INDEPENDENT_AMBULATORY_CARE_PROVIDER_SITE_OTHER): Payer: Medicare HMO

## 2020-02-25 DIAGNOSIS — Z7901 Long term (current) use of anticoagulants: Secondary | ICD-10-CM

## 2020-02-25 NOTE — Progress Notes (Signed)
Pt INr  3.6 as per adam advised skip coumadin and go back to normal and also lmom to daughter

## 2020-03-01 ENCOUNTER — Other Ambulatory Visit: Payer: Self-pay

## 2020-03-01 ENCOUNTER — Ambulatory Visit (INDEPENDENT_AMBULATORY_CARE_PROVIDER_SITE_OTHER): Payer: Medicare HMO

## 2020-03-01 DIAGNOSIS — Z7901 Long term (current) use of anticoagulants: Secondary | ICD-10-CM

## 2020-03-01 NOTE — Progress Notes (Signed)
Pt INR 2.5 as per adam continue same med as prescribed

## 2020-03-13 ENCOUNTER — Other Ambulatory Visit: Payer: Self-pay | Admitting: Adult Health

## 2020-03-13 DIAGNOSIS — I5032 Chronic diastolic (congestive) heart failure: Secondary | ICD-10-CM

## 2020-03-15 ENCOUNTER — Other Ambulatory Visit: Payer: Self-pay | Admitting: Adult Health

## 2020-03-15 ENCOUNTER — Ambulatory Visit: Payer: Medicare HMO | Admitting: Adult Health

## 2020-03-15 ENCOUNTER — Ambulatory Visit (INDEPENDENT_AMBULATORY_CARE_PROVIDER_SITE_OTHER): Payer: Medicare HMO

## 2020-03-15 DIAGNOSIS — Z7901 Long term (current) use of anticoagulants: Secondary | ICD-10-CM | POA: Diagnosis not present

## 2020-03-15 MED ORDER — PHYTONADIONE 5 MG PO TABS: 2.5000 mg | ORAL_TABLET | Freq: Once | ORAL | 0 refills | Status: AC

## 2020-03-15 NOTE — Progress Notes (Signed)
Vit K sent for patients INR 6.5

## 2020-03-15 NOTE — Progress Notes (Signed)
Pt INR was 6.0 on Saturday and 6.5 today as per pt daughter she ate collard  as per adam advised pt daughter skip today and tomorrow and continue same med and also advised her that we send vitamin K tab only gave her half tab once and repeat inr in two days after she take Vitamin k tab

## 2020-03-18 ENCOUNTER — Ambulatory Visit (INDEPENDENT_AMBULATORY_CARE_PROVIDER_SITE_OTHER): Payer: Medicare HMO

## 2020-03-18 DIAGNOSIS — Z7901 Long term (current) use of anticoagulants: Secondary | ICD-10-CM | POA: Diagnosis not present

## 2020-03-18 NOTE — Progress Notes (Signed)
Pt INR 1.5 as per adam continue same from today  med as prescribed and repeat next week

## 2020-03-19 ENCOUNTER — Ambulatory Visit: Payer: Self-pay | Admitting: Nurse Practitioner

## 2020-03-19 ENCOUNTER — Telehealth: Payer: Self-pay

## 2020-03-19 NOTE — Telephone Encounter (Signed)
Confirmed appointment on 03/24/2020 and screened for covid. [facility]

## 2020-03-24 ENCOUNTER — Ambulatory Visit (INDEPENDENT_AMBULATORY_CARE_PROVIDER_SITE_OTHER): Payer: Medicare HMO | Admitting: Adult Health

## 2020-03-24 ENCOUNTER — Other Ambulatory Visit: Payer: Self-pay

## 2020-03-24 ENCOUNTER — Encounter: Payer: Self-pay | Admitting: Adult Health

## 2020-03-24 VITALS — BP 138/39 | HR 88 | Temp 97.5°F | Resp 16 | Ht <= 58 in | Wt 122.2 lb

## 2020-03-24 DIAGNOSIS — I5032 Chronic diastolic (congestive) heart failure: Secondary | ICD-10-CM

## 2020-03-24 DIAGNOSIS — Z0001 Encounter for general adult medical examination with abnormal findings: Secondary | ICD-10-CM

## 2020-03-24 DIAGNOSIS — M25512 Pain in left shoulder: Secondary | ICD-10-CM

## 2020-03-24 DIAGNOSIS — F339 Major depressive disorder, recurrent, unspecified: Secondary | ICD-10-CM

## 2020-03-24 DIAGNOSIS — E782 Mixed hyperlipidemia: Secondary | ICD-10-CM

## 2020-03-24 DIAGNOSIS — R300 Dysuria: Secondary | ICD-10-CM

## 2020-03-24 DIAGNOSIS — I10 Essential (primary) hypertension: Secondary | ICD-10-CM | POA: Diagnosis not present

## 2020-03-24 DIAGNOSIS — I4891 Unspecified atrial fibrillation: Secondary | ICD-10-CM

## 2020-03-24 DIAGNOSIS — K219 Gastro-esophageal reflux disease without esophagitis: Secondary | ICD-10-CM

## 2020-03-24 DIAGNOSIS — E11649 Type 2 diabetes mellitus with hypoglycemia without coma: Secondary | ICD-10-CM | POA: Diagnosis not present

## 2020-03-24 DIAGNOSIS — G2581 Restless legs syndrome: Secondary | ICD-10-CM

## 2020-03-24 DIAGNOSIS — F0150 Vascular dementia without behavioral disturbance: Secondary | ICD-10-CM

## 2020-03-24 LAB — POCT UA - MICROALBUMIN
Albumin/Creatinine Ratio, Urine, POC: >300
Microalbumin Ur, POC: 150 mg/L

## 2020-03-24 MED ORDER — WARFARIN SODIUM 4 MG PO TABS: ORAL_TABLET | ORAL | 3 refills | Status: DC

## 2020-03-24 MED ORDER — DIGOXIN 125 MCG PO TABS: 0.1250 mg | ORAL_TABLET | Freq: Every day | ORAL | 1 refills | Status: DC

## 2020-03-24 MED ORDER — TRAMADOL HCL 50 MG PO TABS: 50.0000 mg | ORAL_TABLET | Freq: Two times a day (BID) | ORAL | 0 refills | Status: DC | PRN

## 2020-03-24 MED ORDER — CITALOPRAM HYDROBROMIDE 40 MG PO TABS: 40.0000 mg | ORAL_TABLET | Freq: Every day | ORAL | 1 refills | Status: DC

## 2020-03-24 MED ORDER — TOUJEO MAX SOLOSTAR 300 UNIT/ML ~~LOC~~ SOPN: 14.0000 [IU] | PEN_INJECTOR | Freq: Every day | SUBCUTANEOUS | 2 refills | Status: DC

## 2020-03-24 MED ORDER — BD SWAB SINGLE USE REGULAR PADS: MEDICATED_PAD | 3 refills | Status: DC

## 2020-03-24 MED ORDER — BD PEN NEEDLE NANO U/F 32G X 4 MM MISC: 1 refills | Status: DC

## 2020-03-24 MED ORDER — PANTOPRAZOLE SODIUM 40 MG PO TBEC: 40.0000 mg | DELAYED_RELEASE_TABLET | Freq: Two times a day (BID) | ORAL | 1 refills | Status: DC

## 2020-03-24 MED ORDER — ATORVASTATIN CALCIUM 10 MG PO TABS: 10.0000 mg | ORAL_TABLET | Freq: Every evening | ORAL | 1 refills | Status: DC

## 2020-03-24 MED ORDER — ROPINIROLE HCL 0.5 MG PO TABS: 0.5000 mg | ORAL_TABLET | Freq: Every evening | ORAL | 1 refills | Status: DC

## 2020-03-24 MED ORDER — DONEPEZIL HCL 5 MG PO TABS: ORAL_TABLET | ORAL | 2 refills | Status: DC

## 2020-03-24 MED ORDER — VICTOZA 18 MG/3ML ~~LOC~~ SOPN: 1.8000 mg | PEN_INJECTOR | Freq: Every day | SUBCUTANEOUS | 3 refills | Status: DC

## 2020-03-24 MED ORDER — FUROSEMIDE 20 MG PO TABS: 20.0000 mg | ORAL_TABLET | Freq: Every day | ORAL | 0 refills | Status: DC

## 2020-03-24 NOTE — Progress Notes (Signed)
[facility] [place], [place] [address]  Internal MEDICINE  Office Visit Note  Patient Name: [name]  [id number]  [id number]  Date of Service: 03/24/2020  Chief Complaint  Patient presents with  . Medicare Wellness  . Diabetes  . Gastroesophageal Reflux  . Hyperlipidemia  . Hypertension  . Medication Refill     HPI Pt is here for routine health maintenance examination.  Patient is well appearing 79 yo female.  She has a history of DM, GERD, HTN, and HLD.  Overall she is doing well. Her blood pressure is controlled.  Denies Chest pain, Shortness of breath, palpitations, headache, or blurred vision. Her GERD and DM are also controlled at this time.   Current Medication: Outpatient Encounter Medications as of 03/24/2020  Medication Sig  . ACCU-CHEK FASTCLIX LANCETS MISC   . acetaminophen (TYLENOL) 500 MG tablet Take by mouth.  Marland Kitchen acetaminophen-codeine (TYLENOL #3) 300-30 MG tablet Take 1 tablet by mouth every 6 (six) hours as needed for moderate pain.  . Alcohol Swabs (B-D SINGLE USE SWABS REGULAR) PADS USE AS DIRECTED TWICE DAILY  . bisacodyl (DULCOLAX) 5 MG EC tablet Take 1 tablet (5 mg total) by mouth daily as needed for moderate constipation.  . citalopram (CELEXA) 40 MG tablet Take 1 tablet (40 mg total) by mouth daily.  . digoxin (LANOXIN) 0.125 MG tablet Take 1 tablet (0.125 mg total) by mouth daily.  Mariane Baumgarten Sodium (DSS) 100 MG CAPS Take by mouth.  . donepezil (ARICEPT) 5 MG tablet TAKE 1 TABLET BY MOUTH ONCE DAILY FOR MEMORY  . ergocalciferol (VITAMIN D2) 1.25 MG (50000 UT) capsule Take by mouth.  . fluticasone (FLONASE) 50 MCG/ACT nasal spray Place 2 sprays into both nostrils daily.  . furosemide (LASIX) 20 MG tablet Take 1 tablet (20 mg total) by mouth daily.  Marland Kitchen glucose blood (ACCU-CHEK GUIDE) test strip   . glucose blood test strip Use as instructed to check blood sugars twice a day.  E11.65  . insulin glargine, 2 Unit Dial, (TOUJEO  MAX SOLOSTAR) 300 UNIT/ML Solostar Pen Inject 14 Units into the skin daily.  . Insulin Pen Needle (BD PEN NEEDLE NANO U/F) 32G X 4 MM MISC Use as directed  . liraglutide (VICTOZA) 18 MG/3ML SOPN Inject 0.3 mLs (1.8 mg total) into the skin daily.  . pantoprazole (PROTONIX) 40 MG tablet Take 1 tablet (40 mg total) by mouth 2 (two) times daily.  Marland Kitchen rOPINIRole (REQUIP) 0.5 MG tablet Take 1 tablet (0.5 mg total) by mouth every evening.  . traMADol (ULTRAM) 50 MG tablet Take 1 tablet (50 mg total) by mouth 2 (two) times daily as needed. for pain  . warfarin (COUMADIN) 4 MG tablet Take 1 and 1/2 tab po daily except for Monday and friday  . [DISCONTINUED] Alcohol Swabs (B-D SINGLE USE SWABS REGULAR) PADS USE AS DIRECTED TWICE DAILY  . [DISCONTINUED] citalopram (CELEXA) 40 MG tablet Take 1 tablet (40 mg total) by mouth daily.  . [DISCONTINUED] digoxin (LANOXIN) 0.125 MG tablet Take 1 tablet (0.125 mg total) by mouth daily.  . [DISCONTINUED] donepezil (ARICEPT) 5 MG tablet TAKE 1 TABLET BY MOUTH ONCE DAILY FOR MEMORY  . [DISCONTINUED] esomeprazole (NEXIUM) 40 MG capsule Take by mouth.  . [DISCONTINUED] furosemide (LASIX) 20 MG tablet Take 1 tablet by mouth once daily  . [DISCONTINUED] Insulin Glargine, 2 Unit Dial, (TOUJEO MAX SOLOSTAR) 300 UNIT/ML SOPN Inject 14 Units into the skin daily.  . [DISCONTINUED] Insulin Pen Needle (BD  PEN NEEDLE NANO U/F) 32G X 4 MM MISC Use as directed  . [DISCONTINUED] liraglutide (VICTOZA) 18 MG/3ML SOPN Inject 0.3 mLs (1.8 mg total) into the skin daily.  . [DISCONTINUED] pantoprazole (PROTONIX) 40 MG tablet Take 1 tablet (40 mg total) by mouth 2 (two) times daily.  . [DISCONTINUED] rOPINIRole (REQUIP) 0.5 MG tablet Take 1 tablet (0.5 mg total) by mouth every evening.  . [DISCONTINUED] traMADol (ULTRAM) 50 MG tablet Take 1 tablet (50 mg total) by mouth 2 (two) times daily as needed. for pain  . [DISCONTINUED] warfarin (COUMADIN) 4 MG tablet Take 1 and 1/2 tab po daily except  for Monday and friday  . atorvastatin (LIPITOR) 10 MG tablet Take 1 tablet (10 mg total) by mouth every evening.  . [DISCONTINUED] amiodarone (PACERONE) 200 MG tablet Take by mouth.  . [DISCONTINUED] atorvastatin (LIPITOR) 10 MG tablet Take 1 tablet (10 mg total) by mouth every evening.  . [DISCONTINUED] carvedilol (COREG) 6.25 MG tablet   . [DISCONTINUED] diclofenac Sodium (VOLTAREN) 1 % GEL   . [DISCONTINUED] enalapril (VASOTEC) 5 MG tablet Take by mouth.  . [DISCONTINUED] glimepiride (AMARYL) 2 MG tablet Take by mouth.  . [DISCONTINUED] HYDROcodone-acetaminophen (NORCO/VICODIN) 5-325 MG tablet Take by mouth.  . [DISCONTINUED] loratadine (CLARITIN) 10 MG tablet Take 1 tablet (10 mg total) by mouth daily.  . [DISCONTINUED] metoCLOPramide (REGLAN) 5 MG tablet Take one tab po bid for nausea and one at bed time prn  . [DISCONTINUED] ondansetron (ZOFRAN-ODT) 4 MG disintegrating tablet Take by mouth.  . [DISCONTINUED] pravastatin (PRAVACHOL) 10 MG tablet Take by mouth.  . [DISCONTINUED] ranitidine (ZANTAC) 150 MG capsule   . [DISCONTINUED] spironolactone (ALDACTONE) 25 MG tablet Take by mouth.  . [DISCONTINUED] zolpidem (AMBIEN) 10 MG tablet    No facility-administered encounter medications on file as of 03/24/2020.    Surgical History: Past Surgical History:  Procedure Laterality Date  . ABDOMINAL HYSTERECTOMY    . APPENDECTOMY    . CATARACT EXTRACTION    . CHOLECYSTECTOMY    . HERNIA REPAIR    . right knee replacement    . right nephroectomy      Medical History: Past Medical History:  Diagnosis Date  . Atrial fibrillation ([place])   . Congestive heart failure (CHF) ([place])   . Diabetes mellitus without complication ([place])   . GERD (gastroesophageal reflux disease)   . Hyperlipidemia   . Hypertension     Family History: Family History  Problem Relation Age of Onset  . Breast cancer Mother       Review of Systems  Constitutional: Negative for chills, fatigue and unexpected  weight change.  HENT: Negative for congestion, rhinorrhea, sneezing and sore throat.   Eyes: Negative for photophobia, pain and redness.  Respiratory: Negative for cough, chest tightness and shortness of breath.   Cardiovascular: Negative for chest pain and palpitations.  Gastrointestinal: Negative for abdominal pain, constipation, diarrhea, nausea and vomiting.  Endocrine: Negative.   Genitourinary: Negative for dysuria and frequency.  Musculoskeletal: Negative for arthralgias, back pain, joint swelling and neck pain.  Skin: Negative for rash.  Allergic/Immunologic: Negative.   Neurological: Negative for tremors and numbness.  Hematological: Negative for adenopathy. Does not bruise/bleed easily.  Psychiatric/Behavioral: Negative for behavioral problems and sleep disturbance. The patient is not nervous/anxious.      Vital Signs: BP (!) 138/39   Pulse 88   Temp (!) 97.5 F (36.4 C)   Resp 16   Ht 4\' 10"  (1.473 m)   Wt 122  lb 3.2 oz (55.4 kg)   SpO2 96%   BMI 25.54 kg/m    Physical Exam Vitals and nursing note reviewed.  Constitutional:      General: She is not in acute distress.    Appearance: She is well-developed. She is not diaphoretic.  HENT:     Head: Normocephalic and atraumatic.     Mouth/Throat:     Pharynx: No oropharyngeal exudate.  Eyes:     Pupils: Pupils are equal, round, and reactive to light.  Neck:     Thyroid: No thyromegaly.     Vascular: No JVD.     Trachea: No tracheal deviation.  Cardiovascular:     Rate and Rhythm: Normal rate and regular rhythm.     Heart sounds: Normal heart sounds. No murmur heard.  No friction rub. No gallop.   Pulmonary:     Effort: Pulmonary effort is normal. No respiratory distress.     Breath sounds: Normal breath sounds. No wheezing or rales.  Chest:     Chest wall: No tenderness.  Abdominal:     Palpations: Abdomen is soft.     Tenderness: There is no abdominal tenderness. There is no guarding.  Musculoskeletal:         General: Normal range of motion.     Cervical back: Normal range of motion and neck supple.  Lymphadenopathy:     Cervical: No cervical adenopathy.  Skin:    General: Skin is warm and dry.  Neurological:     Mental Status: She is alert and oriented to person, place, and time.     Cranial Nerves: No cranial nerve deficit.  Psychiatric:        Behavior: Behavior normal.        Thought Content: Thought content normal.        Judgment: Judgment normal.      LABS: No results found for this or any previous visit (from the past 2160 hour(s)).   Assessment/Plan: 1. Encounter for general adult medical examination with abnormal findings Up to date on PHM - CBC with Differential/Platelet - Lipid Panel With LDL/HDL Ratio - TSH - T4, free - Comprehensive metabolic panel  2. Uncontrolled type 2 diabetes mellitus with hypoglycemia, unspecified hypoglycemia coma status ([place]) Well controlled, continue current management. - Hemoglobin A1c - liraglutide (VICTOZA) 18 MG/3ML SOPN; Inject 0.3 mLs (1.8 mg total) into the skin daily.  Dispense: 18 mL; Refill: 3 - Alcohol Swabs (B-D SINGLE USE SWABS REGULAR) PADS; USE AS DIRECTED TWICE DAILY  Dispense: 200 each; Refill: 3 - Insulin Pen Needle (BD PEN NEEDLE NANO U/F) 32G X 4 MM MISC; Use as directed  Dispense: 100 each; Refill: 1 - insulin glargine, 2 Unit Dial, (TOUJEO MAX SOLOSTAR) 300 UNIT/ML Solostar Pen; Inject 14 Units into the skin daily.  Dispense: 15 mL; Refill: 2 - POCT UA - Microalbumin  3. Chronic diastolic congestive heart failure (HCC) Will have Echo and Carotid - ECHOCARDIOGRAM COMPLETE; Future - US Carotid Bilateral; Future - furosemide (LASIX) 20 MG tablet; Take 1 tablet (20 mg total) by mouth daily.  Dispense: 90 tablet; Refill: 0  4. Essential hypertension Continue current management. Repeat BP was 136/60  5. Mixed hyperlipidemia Stable, continue lipitor as prescribed.  - atorvastatin (LIPITOR) 10 MG tablet; Take 1  tablet (10 mg total) by mouth every evening.  Dispense: 90 tablet; Refill: 1  6. Gastroesophageal reflux disease without esophagitis Continue Protonix as directed.  - pantoprazole (PROTONIX) 40 MG tablet; Take 1 tablet (40  mg total) by mouth 2 (two) times daily.  Dispense: 180 tablet; Refill: 1  7. Dysuria - Urinalysis, Routine w reflex microscopic  8. Acute pain of left shoulder Reviewed risks and possible side effects associated with taking opiates, benzodiazepines and other CNS depressants. Combination of these could cause dizziness and drowsiness. Advised patient not to drive or operate machinery when taking these medications, as patient's and other's life can be at risk and will have consequences. Patient verbalized understanding in this matter. Dependence and abuse for these drugs will be monitored closely. A Controlled substance policy and procedure is on file which allows [facility] to order a urine drug screen test at any visit. Patient understands and agrees with the plan - traMADol (ULTRAM) 50 MG tablet; Take 1 tablet (50 mg total) by mouth 2 (two) times daily as needed. for pain  Dispense: 45 tablet; Refill: 0  9. Restless leg Stable, continue requip as directed.  - rOPINIRole (REQUIP) 0.5 MG tablet; Take 1 tablet (0.5 mg total) by mouth every evening.  Dispense: 90 tablet; Refill: 1  10. Depression, recurrent ([place]) Continue celexa, good control of symptoms.  - citalopram (CELEXA) 40 MG tablet; Take 1 tablet (40 mg total) by mouth daily.  Dispense: 90 tablet; Refill: 1  11. Atrial fibrillation, unspecified type ([place]) Continue digoxin and coumadin as prescribed.  Continue to follow up with Cardiology.  - warfarin (COUMADIN) 4 MG tablet; Take 1 and 1/2 tab po daily except for Monday and friday  Dispense: 145 tablet; Refill: 3 - digoxin (LANOXIN) 0.125 MG tablet; Take 1 tablet (0.125 mg total) by mouth daily.  Dispense: 90 tablet; Refill: 1  12. Vascular dementia  without behavioral disturbance (HCC) - donepezil (ARICEPT) 5 MG tablet; TAKE 1 TABLET BY MOUTH ONCE DAILY FOR MEMORY  Dispense: 90 tablet; Refill: 2  General Counseling: [name] verbalizes understanding of the findings of todays visit and agrees with plan of treatment. I have discussed any further diagnostic evaluation that may be needed or ordered today. We also reviewed her medications today. she has been encouraged to call the office with any questions or concerns that should arise related to todays visit.   Orders Placed This Encounter  Procedures  . US Carotid Bilateral  . Urinalysis, Routine w reflex microscopic  . CBC with Differential/Platelet  . Lipid Panel With LDL/HDL Ratio  . TSH  . T4, free  . Comprehensive metabolic panel  . Hemoglobin A1c  . ECHOCARDIOGRAM COMPLETE    Meds ordered this encounter  Medications  . traMADol (ULTRAM) 50 MG tablet    Sig: Take 1 tablet (50 mg total) by mouth 2 (two) times daily as needed. for pain    Dispense:  45 tablet    Refill:  0  . rOPINIRole (REQUIP) 0.5 MG tablet    Sig: Take 1 tablet (0.5 mg total) by mouth every evening.    Dispense:  90 tablet    Refill:  1  . pantoprazole (PROTONIX) 40 MG tablet    Sig: Take 1 tablet (40 mg total) by mouth 2 (two) times daily.    Dispense:  180 tablet    Refill:  1  . citalopram (CELEXA) 40 MG tablet    Sig: Take 1 tablet (40 mg total) by mouth daily.    Dispense:  90 tablet    Refill:  1  . warfarin (COUMADIN) 4 MG tablet    Sig: Take 1 and 1/2 tab po daily except for Monday and friday  Dispense:  145 tablet    Refill:  3  . furosemide (LASIX) 20 MG tablet    Sig: Take 1 tablet (20 mg total) by mouth daily.    Dispense:  90 tablet    Refill:  0  . atorvastatin (LIPITOR) 10 MG tablet    Sig: Take 1 tablet (10 mg total) by mouth every evening.    Dispense:  90 tablet    Refill:  1  . digoxin (LANOXIN) 0.125 MG tablet    Sig: Take 1 tablet (0.125 mg total) by mouth daily.     Dispense:  90 tablet    Refill:  1  . donepezil (ARICEPT) 5 MG tablet    Sig: TAKE 1 TABLET BY MOUTH ONCE DAILY FOR MEMORY    Dispense:  90 tablet    Refill:  2  . liraglutide (VICTOZA) 18 MG/3ML SOPN    Sig: Inject 0.3 mLs (1.8 mg total) into the skin daily.    Dispense:  18 mL    Refill:  3  . Alcohol Swabs (B-D SINGLE USE SWABS REGULAR) PADS    Sig: USE AS DIRECTED TWICE DAILY    Dispense:  200 each    Refill:  3  . Insulin Pen Needle (BD PEN NEEDLE NANO U/F) 32G X 4 MM MISC    Sig: Use as directed    Dispense:  100 each    Refill:  1  . insulin glargine, 2 Unit Dial, (TOUJEO MAX SOLOSTAR) 300 UNIT/ML Solostar Pen    Sig: Inject 14 Units into the skin daily.    Dispense:  15 mL    Refill:  2    Time spent: 30 Minutes   This patient was seen by Orson Gear AGNP-C in Collaboration with Dr Lavera Guise as a part of collaborative care agreement    Kendell Bane AGNP-C Internal Medicine

## 2020-03-25 LAB — URINALYSIS, ROUTINE W REFLEX MICROSCOPIC
Bilirubin, UA: NEGATIVE
Glucose, UA: NEGATIVE
Ketones, UA: NEGATIVE
Leukocytes,UA: NEGATIVE
Nitrite, UA: NEGATIVE
Specific Gravity, UA: 1.014 (ref 1.005–1.030)
Urobilinogen, Ur: 0.2 mg/dL (ref 0.2–1.0)
pH, UA: 5.5 (ref 5.0–7.5)

## 2020-03-25 LAB — MICROSCOPIC EXAMINATION
Casts: NONE SEEN /lpf
Epithelial Cells (non renal): >10 /hpf — AB (ref 0–10)
RBC, Urine: >30 /hpf — AB (ref 0–2)

## 2020-03-26 ENCOUNTER — Ambulatory Visit (INDEPENDENT_AMBULATORY_CARE_PROVIDER_SITE_OTHER): Payer: Medicare HMO

## 2020-03-26 ENCOUNTER — Other Ambulatory Visit: Payer: Self-pay

## 2020-03-26 DIAGNOSIS — Z7901 Long term (current) use of anticoagulants: Secondary | ICD-10-CM

## 2020-03-26 NOTE — Progress Notes (Signed)
Pt INR 3.3 as per heather advised her daughter continue same dosing as prescribed

## 2020-04-01 ENCOUNTER — Other Ambulatory Visit: Payer: Self-pay

## 2020-04-01 MED ORDER — DULCOLAX 5 MG PO TBEC: 5.0000 mg | DELAYED_RELEASE_TABLET | Freq: Every day | ORAL | 1 refills | Status: DC | PRN

## 2020-04-05 ENCOUNTER — Ambulatory Visit (INDEPENDENT_AMBULATORY_CARE_PROVIDER_SITE_OTHER): Payer: Medicare HMO

## 2020-04-05 DIAGNOSIS — Z7901 Long term (current) use of anticoagulants: Secondary | ICD-10-CM

## 2020-04-05 NOTE — Progress Notes (Signed)
Pt INR 3.3 as per adam continue same med recheck next  Week

## 2020-04-14 ENCOUNTER — Ambulatory Visit (INDEPENDENT_AMBULATORY_CARE_PROVIDER_SITE_OTHER): Payer: Medicare HMO

## 2020-04-14 DIAGNOSIS — Z7901 Long term (current) use of anticoagulants: Secondary | ICD-10-CM | POA: Diagnosis not present

## 2020-04-14 NOTE — Progress Notes (Signed)
Pt INR 3.9 as per adam  Advised daughter skip today and go back to normal dosing as prescribed

## 2020-04-21 ENCOUNTER — Telehealth: Payer: Self-pay

## 2020-04-21 NOTE — Telephone Encounter (Signed)
Confirmed appointment on 04/23/2020 and screened for covid. klh 

## 2020-04-21 NOTE — Telephone Encounter (Signed)
Called lmom informing patient of appointment on 04/23/2020. klh

## 2020-04-23 ENCOUNTER — Other Ambulatory Visit: Payer: Medicare HMO

## 2020-04-28 ENCOUNTER — Ambulatory Visit (INDEPENDENT_AMBULATORY_CARE_PROVIDER_SITE_OTHER): Payer: Medicare HMO

## 2020-04-28 DIAGNOSIS — Z7901 Long term (current) use of anticoagulants: Secondary | ICD-10-CM

## 2020-04-28 NOTE — Progress Notes (Signed)
PT INR is 3.0 as per [place] continue same as perscribed

## 2020-05-07 ENCOUNTER — Other Ambulatory Visit: Payer: Self-pay

## 2020-05-07 ENCOUNTER — Ambulatory Visit: Payer: Medicare HMO

## 2020-05-07 DIAGNOSIS — I5032 Chronic diastolic (congestive) heart failure: Secondary | ICD-10-CM | POA: Diagnosis not present

## 2020-05-13 ENCOUNTER — Ambulatory Visit: Payer: Self-pay | Admitting: Adult Health

## 2020-05-14 ENCOUNTER — Ambulatory Visit (INDEPENDENT_AMBULATORY_CARE_PROVIDER_SITE_OTHER): Payer: Medicare HMO

## 2020-05-14 DIAGNOSIS — Z7901 Long term (current) use of anticoagulants: Secondary | ICD-10-CM

## 2020-05-14 NOTE — Progress Notes (Signed)
Pt inr 3.1 as per heather continue same med as prescribed

## 2020-05-21 ENCOUNTER — Other Ambulatory Visit: Payer: Self-pay

## 2020-05-21 ENCOUNTER — Ambulatory Visit: Payer: Medicare HMO

## 2020-05-21 ENCOUNTER — Telehealth: Payer: Self-pay

## 2020-05-21 DIAGNOSIS — E1165 Type 2 diabetes mellitus with hyperglycemia: Secondary | ICD-10-CM | POA: Diagnosis not present

## 2020-05-21 DIAGNOSIS — Z0001 Encounter for general adult medical examination with abnormal findings: Secondary | ICD-10-CM | POA: Diagnosis not present

## 2020-05-21 DIAGNOSIS — I6523 Occlusion and stenosis of bilateral carotid arteries: Secondary | ICD-10-CM

## 2020-05-21 DIAGNOSIS — I5032 Chronic diastolic (congestive) heart failure: Secondary | ICD-10-CM | POA: Diagnosis not present

## 2020-05-21 DIAGNOSIS — E782 Mixed hyperlipidemia: Secondary | ICD-10-CM | POA: Diagnosis not present

## 2020-05-21 DIAGNOSIS — E0789 Other specified disorders of thyroid: Secondary | ICD-10-CM | POA: Diagnosis not present

## 2020-05-21 NOTE — Telephone Encounter (Signed)
Confirmed appointment on 05/25/2020. klh

## 2020-05-22 LAB — COMPREHENSIVE METABOLIC PANEL
ALT: 10 IU/L (ref 0–32)
AST: 20 IU/L (ref 0–40)
Albumin/Globulin Ratio: 1 — ABNORMAL LOW (ref 1.2–2.2)
Albumin: 3.2 g/dL — ABNORMAL LOW (ref 3.7–4.7)
Alkaline Phosphatase: 87 IU/L (ref 48–121)
BUN/Creatinine Ratio: 15 (ref 12–28)
BUN: 28 mg/dL — ABNORMAL HIGH (ref 8–27)
Bilirubin Total: <0.2 mg/dL (ref 0.0–1.2)
CO2: 24 mmol/L (ref 20–29)
Calcium: 8.5 mg/dL — ABNORMAL LOW (ref 8.7–10.3)
Chloride: 101 mmol/L (ref 96–106)
Creatinine, Ser: 1.85 mg/dL — ABNORMAL HIGH (ref 0.57–1.00)
GFR calc Af Amer: 29 mL/min/{1.73_m2} — ABNORMAL LOW (ref >59)
GFR calc non Af Amer: 26 mL/min/{1.73_m2} — ABNORMAL LOW (ref >59)
Globulin, Total: 3.1 g/dL (ref 1.5–4.5)
Glucose: 94 mg/dL (ref 65–99)
Potassium: 4.6 mmol/L (ref 3.5–5.2)
Sodium: 137 mmol/L (ref 134–144)
Total Protein: 6.3 g/dL (ref 6.0–8.5)

## 2020-05-22 LAB — T4, FREE: Free T4: 1.07 ng/dL (ref 0.82–1.77)

## 2020-05-22 LAB — CBC WITH DIFFERENTIAL/PLATELET
Basophils Absolute: 0.1 10*3/uL (ref 0.0–0.2)
Basos: 1 %
EOS (ABSOLUTE): 0.2 10*3/uL (ref 0.0–0.4)
Eos: 2 %
Hematocrit: 24.4 % — ABNORMAL LOW (ref 34.0–46.6)
Hemoglobin: 7.5 g/dL — ABNORMAL LOW (ref 11.1–15.9)
Immature Grans (Abs): 0.1 10*3/uL (ref 0.0–0.1)
Immature Granulocytes: 1 %
Lymphocytes Absolute: 2.6 10*3/uL (ref 0.7–3.1)
Lymphs: 28 %
MCH: 25.2 pg — ABNORMAL LOW (ref 26.6–33.0)
MCHC: 30.7 g/dL — ABNORMAL LOW (ref 31.5–35.7)
MCV: 82 fL (ref 79–97)
Monocytes Absolute: 0.6 10*3/uL (ref 0.1–0.9)
Monocytes: 6 %
Neutrophils Absolute: 6 10*3/uL (ref 1.4–7.0)
Neutrophils: 62 %
Platelets: 304 10*3/uL (ref 150–450)
RBC: 2.98 x10E6/uL — ABNORMAL LOW (ref 3.77–5.28)
RDW: 15.1 % (ref 11.7–15.4)
WBC: 9.5 10*3/uL (ref 3.4–10.8)

## 2020-05-22 LAB — TSH: TSH: 2.86 u[IU]/mL (ref 0.450–4.500)

## 2020-05-22 LAB — LIPID PANEL WITH LDL/HDL RATIO
Cholesterol, Total: 103 mg/dL (ref 100–199)
HDL: 29 mg/dL — ABNORMAL LOW (ref >39)
LDL Chol Calc (NIH): 50 mg/dL (ref 0–99)
LDL/HDL Ratio: 1.7 ratio (ref 0.0–3.2)
Triglycerides: 136 mg/dL (ref 0–149)
VLDL Cholesterol Cal: 24 mg/dL (ref 5–40)

## 2020-05-22 LAB — HEMOGLOBIN A1C
Est. average glucose Bld gHb Est-mCnc: 160 mg/dL
Hgb A1c MFr Bld: 7.2 % — ABNORMAL HIGH (ref 4.8–5.6)

## 2020-05-24 ENCOUNTER — Ambulatory Visit (INDEPENDENT_AMBULATORY_CARE_PROVIDER_SITE_OTHER): Payer: Medicare HMO

## 2020-05-24 DIAGNOSIS — Z7901 Long term (current) use of anticoagulants: Secondary | ICD-10-CM

## 2020-05-24 NOTE — Progress Notes (Signed)
Pt INR 3.1 as per adam pt can continue same med as prescribed

## 2020-05-25 ENCOUNTER — Ambulatory Visit (INDEPENDENT_AMBULATORY_CARE_PROVIDER_SITE_OTHER): Payer: Medicare HMO | Admitting: Adult Health

## 2020-05-25 ENCOUNTER — Other Ambulatory Visit: Payer: Self-pay

## 2020-05-25 ENCOUNTER — Encounter: Payer: Self-pay | Admitting: Adult Health

## 2020-05-25 ENCOUNTER — Other Ambulatory Visit: Payer: Self-pay | Admitting: Adult Health

## 2020-05-25 VITALS — BP 145/55 | HR 71 | Temp 97.3°F | Resp 16 | Ht <= 58 in | Wt 124.6 lb

## 2020-05-25 DIAGNOSIS — I5032 Chronic diastolic (congestive) heart failure: Secondary | ICD-10-CM | POA: Diagnosis not present

## 2020-05-25 DIAGNOSIS — I10 Essential (primary) hypertension: Secondary | ICD-10-CM | POA: Diagnosis not present

## 2020-05-25 DIAGNOSIS — D649 Anemia, unspecified: Secondary | ICD-10-CM | POA: Diagnosis not present

## 2020-05-25 DIAGNOSIS — E11649 Type 2 diabetes mellitus with hypoglycemia without coma: Secondary | ICD-10-CM

## 2020-05-25 DIAGNOSIS — R110 Nausea: Secondary | ICD-10-CM

## 2020-05-25 DIAGNOSIS — I6523 Occlusion and stenosis of bilateral carotid arteries: Secondary | ICD-10-CM | POA: Diagnosis not present

## 2020-05-25 NOTE — Progress Notes (Signed)
[facility] [place], [place] [address]  Internal MEDICINE  Office Visit Note  Patient Name: [name]  [id number]  [id number]  Date of Service: 06/01/2020  Chief Complaint  Patient presents with  . Follow-up    Lab/Ultrasound follow up, needs med refills  . Diabetes  . Gastroesophageal Reflux  . Hypertension  . Hyperlipidemia  . Quality Metric Gaps    Eye Exam    HPI  Pt is here for follow up on Carotid US and lab work.  Overall she is doing well.  She appears to be at her baseline.  She is joined by her daughter in exam room.      Current Medication: Outpatient Encounter Medications as of 05/25/2020  Medication Sig  . ACCU-CHEK FASTCLIX LANCETS MISC   . acetaminophen (TYLENOL) 500 MG tablet Take by mouth.  Marland Kitchen acetaminophen-codeine (TYLENOL #3) 300-30 MG tablet Take 1 tablet by mouth every 6 (six) hours as needed for moderate pain.  . Alcohol Swabs (B-D SINGLE USE SWABS REGULAR) PADS USE AS DIRECTED TWICE DAILY  . atorvastatin (LIPITOR) 10 MG tablet Take 1 tablet (10 mg total) by mouth every evening.  . bisacodyl (DULCOLAX) 5 MG EC tablet Take 1 tablet (5 mg total) by mouth daily as needed for moderate constipation.  . citalopram (CELEXA) 40 MG tablet Take 1 tablet (40 mg total) by mouth daily.  . digoxin (LANOXIN) 0.125 MG tablet Take 1 tablet (0.125 mg total) by mouth daily.  Mariane Baumgarten Sodium (DSS) 100 MG CAPS Take by mouth.  . donepezil (ARICEPT) 5 MG tablet TAKE 1 TABLET BY MOUTH ONCE DAILY FOR MEMORY  . ergocalciferol (VITAMIN D2) 1.25 MG (50000 UT) capsule Take by mouth.  . fluticasone (FLONASE) 50 MCG/ACT nasal spray Place 2 sprays into both nostrils daily.  . furosemide (LASIX) 20 MG tablet Take 1 tablet (20 mg total) by mouth daily.  Marland Kitchen glucose blood (ACCU-CHEK GUIDE) test strip   . glucose blood test strip Use as instructed to check blood sugars twice a day.  E11.65  . insulin glargine, 2 Unit Dial, (TOUJEO MAX SOLOSTAR) 300 UNIT/ML  Solostar Pen Inject 14 Units into the skin daily.  . Insulin Pen Needle (BD PEN NEEDLE NANO U/F) 32G X 4 MM MISC Use as directed  . liraglutide (VICTOZA) 18 MG/3ML SOPN Inject 0.3 mLs (1.8 mg total) into the skin daily.  . pantoprazole (PROTONIX) 40 MG tablet Take 1 tablet (40 mg total) by mouth 2 (two) times daily.  Marland Kitchen rOPINIRole (REQUIP) 0.5 MG tablet Take 1 tablet (0.5 mg total) by mouth every evening.  . traMADol (ULTRAM) 50 MG tablet Take 1 tablet (50 mg total) by mouth 2 (two) times daily as needed. for pain  . warfarin (COUMADIN) 4 MG tablet Take 1 and 1/2 tab po daily except for Monday and friday   No facility-administered encounter medications on file as of 05/25/2020.    Surgical History: Past Surgical History:  Procedure Laterality Date  . ABDOMINAL HYSTERECTOMY    . APPENDECTOMY    . CATARACT EXTRACTION    . CHOLECYSTECTOMY    . HERNIA REPAIR    . right knee replacement    . right nephroectomy      Medical History: Past Medical History:  Diagnosis Date  . Atrial fibrillation ([place])   . Congestive heart failure (CHF) ([place])   . Diabetes mellitus without complication ([place])   . GERD (gastroesophageal reflux disease)   . Hyperlipidemia   . Hypertension  Family History: Family History  Problem Relation Age of Onset  . Breast cancer Mother     Social History   Socioeconomic History  . Marital status: Widowed    Spouse name: Not on file  . Number of children: Not on file  . Years of education: Not on file  . Highest education level: Not on file  Occupational History  . Not on file  Tobacco Use  . Smoking status: Current Every Day Smoker    Packs/day: 1.00    Types: Cigarettes    Last attempt to quit: 01/03/2019    Years since quitting: 1.4  . Smokeless tobacco: Never Used  Vaping Use  . Vaping Use: Never used  Substance and Sexual Activity  . Alcohol use: No  . Drug use: No  . Sexual activity: Not on file  Other Topics Concern  . Not on file  Social  History Narrative  . Not on file   Social Determinants of Health   Financial Resource Strain:   . Difficulty of Paying Living Expenses:   Food Insecurity:   . Worried About Charity fundraiser in the Last Year:   . Arboriculturist in the Last Year:   Transportation Needs:   . Film/video editor (Medical):   Marland Kitchen Lack of Transportation (Non-Medical):   Physical Activity:   . Days of Exercise per Week:   . Minutes of Exercise per Session:   Stress:   . Feeling of Stress :   Social Connections:   . Frequency of Communication with Friends and Family:   . Frequency of Social Gatherings with Friends and Family:   . Attends Religious Services:   . Active Member of Clubs or Organizations:   . Attends Archivist Meetings:   Marland Kitchen Marital Status:   Intimate Partner Violence:   . Fear of Current or Ex-Partner:   . Emotionally Abused:   Marland Kitchen Physically Abused:   . Sexually Abused:       Review of Systems  Constitutional: Negative for chills, fatigue and unexpected weight change.  HENT: Negative for congestion, rhinorrhea, sneezing and sore throat.   Eyes: Negative for photophobia, pain and redness.  Respiratory: Negative for cough, chest tightness and shortness of breath.   Cardiovascular: Negative for chest pain and palpitations.  Gastrointestinal: Negative for abdominal pain, constipation, diarrhea, nausea and vomiting.  Endocrine: Negative.   Genitourinary: Negative for dysuria and frequency.  Musculoskeletal: Negative for arthralgias, back pain, joint swelling and neck pain.  Skin: Negative for rash.  Allergic/Immunologic: Negative.   Neurological: Negative for tremors and numbness.  Hematological: Negative for adenopathy. Does not bruise/bleed easily.  Psychiatric/Behavioral: Negative for behavioral problems and sleep disturbance. The patient is not nervous/anxious.     Vital Signs: BP (!) 145/55   Pulse 71   Temp (!) 97.3 F (36.3 C)   Resp 16   Ht 4\' 10"  (1.473  m)   Wt 124 lb 9.6 oz (56.5 kg)   SpO2 99%   BMI 26.04 kg/m    Physical Exam Vitals and nursing note reviewed.  Constitutional:      General: She is not in acute distress.    Appearance: She is well-developed. She is not diaphoretic.  HENT:     Head: Normocephalic and atraumatic.     Mouth/Throat:     Pharynx: No oropharyngeal exudate.  Eyes:     Pupils: Pupils are equal, round, and reactive to light.  Neck:     Thyroid:  No thyromegaly.     Vascular: No JVD.     Trachea: No tracheal deviation.  Cardiovascular:     Rate and Rhythm: Normal rate and regular rhythm.     Heart sounds: Normal heart sounds. No murmur heard.  No friction rub. No gallop.   Pulmonary:     Effort: Pulmonary effort is normal. No respiratory distress.     Breath sounds: Normal breath sounds. No wheezing or rales.  Chest:     Chest wall: No tenderness.  Abdominal:     Palpations: Abdomen is soft.     Tenderness: There is no abdominal tenderness. There is no guarding.  Musculoskeletal:        General: Normal range of motion.     Cervical back: Normal range of motion and neck supple.  Lymphadenopathy:     Cervical: No cervical adenopathy.  Skin:    General: Skin is warm and dry.  Neurological:     Mental Status: She is alert and oriented to person, place, and time.     Cranial Nerves: No cranial nerve deficit.  Psychiatric:        Behavior: Behavior normal.        Thought Content: Thought content normal.        Judgment: Judgment normal.    Assessment/Plan: 1. Bilateral carotid artery stenosis Bilateral stenosis, will get MRA a this time.  - MR Angiogram Neck W Wo Contrast; Future  2. Anemia, unspecified type Recheck hemoglobin - CBC w/Diff/Platelet  3. Essential hypertension Continue to monitor Bp.   4. Chronic diastolic congestive heart failure ([place]) Discussed overnight oximetry to be done.  - Pulse oximetry, overnight; Future  5. Uncontrolled type 2 diabetes mellitus with  hypoglycemia, unspecified hypoglycemia coma status (HCC) Last A1C 7.2, continue current medication management.   General Counseling: Kayde verbalizes understanding of the findings of todays visit and agrees with plan of treatment. I have discussed any further diagnostic evaluation that may be needed or ordered today. We also reviewed her medications today. she has been encouraged to call the office with any questions or concerns that should arise related to todays visit.    Orders Placed This Encounter  Procedures  . MR Angiogram Neck W Wo Contrast  . CBC w/Diff/Platelet  . Pulse oximetry, overnight    No orders of the defined types were placed in this encounter.   Time spent: 30 Minutes   This patient was seen by Orson Gear AGNP-C in Collaboration with Dr Lavera Guise as a part of collaborative care agreement     Kendell Bane AGNP-C Internal medicine

## 2020-06-01 NOTE — Procedures (Signed)
[place], [place] [address]  DATE OF SERVICE: May 21, 2020  CAROTID DOPPLER INTERPRETATION:  Bilateral Carotid Ultrsasound and Color Doppler Examination was performed. The RIGHT CCA shows moderate plaque in the vessel. The LEFT CCA shows moderate with calcifications plaque in the vessel. There was no significant intimal thickening noted in the RIGHT carotid artery. There was no significant intimal thickening in the LEFT carotid artery.  The RIGHT CCA shows peak systolic velocity of 98 cm per second. The end diastolic velocity is 18 cm per second on the RIGHT side. The RIGHT ICA shows peak systolic velocity of 817 per second. RIGHT sided ICA end diastolic velocity is 33 cm per second. The RIGHT ECA shows a peak systolic velocity of 711 cm per second. The ICA/CCA ratio is calculated to be 2.0. This suggests 50 to 69% stenosis. The Vertebral Artery shows antegrade flow.  The LEFT CCA shows peak systolic velocity of 657 cm per second. The end diastolic velocity is 22 cm per second on the LEFT side. The LEFT ICA shows peak systolic velocity of 903 per second. LEFT sided ICA end diastolic velocity is 45 cm per second. The LEFT ECA shows a peak systolic velocity of 833 cm per second. The ICA/CCA ratio is calculated to be 1.6. This suggests 50 to 69% stenosis. The Vertebral Artery shows antegrade flow.   Impression:    The RIGHT CAROTID shows 50 to 69% stenosis. The LEFT CAROTID shows 50 to 69% stenosis.  There is moderate plaque formation noted on the LEFT and moderate plaque on the RIGHT  side. Consider a repeat Carotid doppler if clinical situation and symptoms warrant in 6-12 months. Patient should be encouraged to change lifestyles such as smoking cessation, regular exercise and dietary modification. Use of statins in the right clinical setting and ASA is encouraged.  Further imaging with CT angio or MRA is recommended  Allyne Gee, MD [facility] Critical  [facility] Medicine

## 2020-06-07 ENCOUNTER — Ambulatory Visit (INDEPENDENT_AMBULATORY_CARE_PROVIDER_SITE_OTHER): Payer: Medicare HMO

## 2020-06-07 ENCOUNTER — Telehealth: Payer: Self-pay

## 2020-06-07 DIAGNOSIS — Z7901 Long term (current) use of anticoagulants: Secondary | ICD-10-CM | POA: Diagnosis not present

## 2020-06-07 NOTE — Telephone Encounter (Signed)
INR was 2.7 as per adam continue same treatment

## 2020-06-08 NOTE — Progress Notes (Signed)
Pt INR 2.7 as per adam continue same med

## 2020-06-13 DIAGNOSIS — Z7901 Long term (current) use of anticoagulants: Secondary | ICD-10-CM | POA: Diagnosis not present

## 2020-06-15 ENCOUNTER — Ambulatory Visit: Admission: RE | Admit: 2020-06-15 | Payer: Medicare HMO | Source: Ambulatory Visit

## 2020-06-17 ENCOUNTER — Other Ambulatory Visit: Payer: Self-pay | Admitting: Adult Health

## 2020-06-17 DIAGNOSIS — E11649 Type 2 diabetes mellitus with hypoglycemia without coma: Secondary | ICD-10-CM

## 2020-06-21 ENCOUNTER — Ambulatory Visit (INDEPENDENT_AMBULATORY_CARE_PROVIDER_SITE_OTHER): Payer: Medicare HMO

## 2020-06-21 DIAGNOSIS — Z7901 Long term (current) use of anticoagulants: Secondary | ICD-10-CM

## 2020-06-21 NOTE — Progress Notes (Signed)
Pt  INR  2.7 as per dr Humphrey Rolls continue same med for now

## 2020-06-29 ENCOUNTER — Telehealth: Payer: Self-pay

## 2020-06-29 NOTE — Telephone Encounter (Signed)
Confirmed and screened for OV on 9/8

## 2020-06-30 ENCOUNTER — Ambulatory Visit: Payer: Medicare HMO | Admitting: Hospice and Palliative Medicine

## 2020-07-09 ENCOUNTER — Ambulatory Visit (INDEPENDENT_AMBULATORY_CARE_PROVIDER_SITE_OTHER): Payer: Medicare HMO

## 2020-07-09 DIAGNOSIS — Z7901 Long term (current) use of anticoagulants: Secondary | ICD-10-CM

## 2020-07-09 NOTE — Progress Notes (Signed)
Pt INR 3.8 as per adam advised pt daughter skip today dose and go back to normal dosing and recheck next week

## 2020-07-19 LAB — PROTIME-INR

## 2020-07-19 NOTE — Progress Notes (Signed)
Scanned INR for the month of April 2021.

## 2020-07-19 NOTE — Progress Notes (Signed)
Scanned INR for the month of July 2021.

## 2020-07-19 NOTE — Progress Notes (Signed)
Scanned INR for the month of February 2021

## 2020-07-19 NOTE — Progress Notes (Signed)
Scanned INR for the month of September 2021

## 2020-07-19 NOTE — Progress Notes (Signed)
Scanned INR for the month of May 2021.

## 2020-07-19 NOTE — Progress Notes (Signed)
Scanned INR for August 2021

## 2020-07-19 NOTE — Progress Notes (Signed)
Scanned INR for the month of April 2021

## 2020-07-19 NOTE — Progress Notes (Signed)
Scanned in INR for the month of March 2021

## 2020-07-19 NOTE — Progress Notes (Unsigned)
Scanned INR for the month of June 2021

## 2020-07-19 NOTE — Progress Notes (Signed)
Scanned INR for the month of June 2021

## 2020-07-22 ENCOUNTER — Ambulatory Visit (INDEPENDENT_AMBULATORY_CARE_PROVIDER_SITE_OTHER): Payer: Medicare HMO

## 2020-07-22 ENCOUNTER — Other Ambulatory Visit: Payer: Self-pay

## 2020-07-22 DIAGNOSIS — Z7901 Long term (current) use of anticoagulants: Secondary | ICD-10-CM | POA: Diagnosis not present

## 2020-07-22 NOTE — Progress Notes (Signed)
Pt INR 3.6 as  Per taylor skip today and skip dose of coumadin tomorrow and go back normal

## 2020-08-01 DIAGNOSIS — Z7901 Long term (current) use of anticoagulants: Secondary | ICD-10-CM | POA: Diagnosis not present

## 2020-08-02 ENCOUNTER — Ambulatory Visit (INDEPENDENT_AMBULATORY_CARE_PROVIDER_SITE_OTHER): Payer: Medicare HMO

## 2020-08-02 DIAGNOSIS — Z7901 Long term (current) use of anticoagulants: Secondary | ICD-10-CM

## 2020-08-02 NOTE — Progress Notes (Signed)
Pt INR 3.2 as per adam advised pt daughter to skip today and go back to  Normal dosing pt take coumadin 4 mg take 1 and 1/2 tab po daily except Monday and Friday take coumadin 4 mg once daily

## 2020-08-11 ENCOUNTER — Ambulatory Visit (INDEPENDENT_AMBULATORY_CARE_PROVIDER_SITE_OTHER): Payer: Medicare HMO

## 2020-08-11 DIAGNOSIS — Z7901 Long term (current) use of anticoagulants: Secondary | ICD-10-CM | POA: Diagnosis not present

## 2020-08-11 NOTE — Progress Notes (Signed)
Pt INR 4.0 spoke to daughter and advised per Nira Conn for pt to skip warfarin today and tomorrow and resume regular dosing after.

## 2020-08-23 ENCOUNTER — Ambulatory Visit (INDEPENDENT_AMBULATORY_CARE_PROVIDER_SITE_OTHER): Payer: Medicare HMO

## 2020-08-23 DIAGNOSIS — Z7901 Long term (current) use of anticoagulants: Secondary | ICD-10-CM

## 2020-08-23 NOTE — Progress Notes (Signed)
Pt INR is 2.6 as Per Dr Humphrey Rolls continue same medicine as perscribed

## 2020-09-02 ENCOUNTER — Ambulatory Visit (INDEPENDENT_AMBULATORY_CARE_PROVIDER_SITE_OTHER): Payer: Medicare HMO

## 2020-09-02 DIAGNOSIS — Z7901 Long term (current) use of anticoagulants: Secondary | ICD-10-CM

## 2020-09-02 NOTE — Progress Notes (Signed)
Pt INR 3.7 as per heather pt daughter skip today and go back to normal

## 2020-09-06 ENCOUNTER — Telehealth: Payer: Self-pay

## 2020-09-06 NOTE — Telephone Encounter (Signed)
Left a message for pt and pt daughter for a callback and schedule appointment with Dr Humphrey Rolls asap to review labs. Salvatrice Morandi

## 2020-09-14 ENCOUNTER — Ambulatory Visit: Payer: Medicare HMO

## 2020-09-19 DIAGNOSIS — Z7901 Long term (current) use of anticoagulants: Secondary | ICD-10-CM | POA: Diagnosis not present

## 2020-09-28 ENCOUNTER — Ambulatory Visit
Admission: RE | Admit: 2020-09-28 | Discharge: 2020-09-28 | Disposition: A | Discharge status: Home / Self Care | Payer: Medicare HMO | Source: Ambulatory Visit | Attending: Adult Health | Admitting: Adult Health

## 2020-09-28 ENCOUNTER — Other Ambulatory Visit: Payer: Self-pay

## 2020-09-28 ENCOUNTER — Ambulatory Visit (INDEPENDENT_AMBULATORY_CARE_PROVIDER_SITE_OTHER): Payer: Medicare HMO

## 2020-09-28 DIAGNOSIS — I6523 Occlusion and stenosis of bilateral carotid arteries: Secondary | ICD-10-CM | POA: Diagnosis not present

## 2020-09-28 DIAGNOSIS — Z7901 Long term (current) use of anticoagulants: Secondary | ICD-10-CM | POA: Diagnosis not present

## 2020-09-28 MED ORDER — GADOBUTROL 1 MMOL/ML IV SOLN
5.0000 mL | Freq: Once | INTRAVENOUS | Status: AC | PRN
  Administered 2020-09-28: 5 mL via INTRAVENOUS

## 2020-09-28 NOTE — Progress Notes (Signed)
Pt INR 5.6  As per [name] advised skip today and dose change take coumadin 4 mg 1.5 every Saturday and take 1 tab po for coumadin 4 mg on MON,tues,wed,thurs,friday and Sunday and check in 1 week

## 2020-10-04 ENCOUNTER — Ambulatory Visit: Payer: Medicare HMO | Admitting: Hospice and Palliative Medicine

## 2020-10-05 ENCOUNTER — Other Ambulatory Visit: Payer: Self-pay | Admitting: Adult Health

## 2020-10-05 DIAGNOSIS — I5032 Chronic diastolic (congestive) heart failure: Secondary | ICD-10-CM

## 2020-10-06 ENCOUNTER — Ambulatory Visit (INDEPENDENT_AMBULATORY_CARE_PROVIDER_SITE_OTHER): Payer: Medicare HMO

## 2020-10-06 DIAGNOSIS — Z7901 Long term (current) use of anticoagulants: Secondary | ICD-10-CM

## 2020-10-06 NOTE — Progress Notes (Signed)
Pt  INR 2.2 as per heather continue same med as prescribe

## 2020-10-07 ENCOUNTER — Other Ambulatory Visit: Payer: Self-pay | Admitting: Internal Medicine

## 2020-10-07 ENCOUNTER — Telehealth: Payer: Self-pay

## 2020-10-07 DIAGNOSIS — Z7901 Long term (current) use of anticoagulants: Secondary | ICD-10-CM

## 2020-10-07 DIAGNOSIS — D508 Other iron deficiency anemias: Secondary | ICD-10-CM

## 2020-10-07 LAB — PROTIME-INR

## 2020-10-07 NOTE — Telephone Encounter (Signed)
lmom to daughter that we put laborder in epic she need to go [facility]

## 2020-10-13 ENCOUNTER — Other Ambulatory Visit: Payer: Self-pay | Admitting: Adult Health

## 2020-10-13 DIAGNOSIS — G2581 Restless legs syndrome: Secondary | ICD-10-CM

## 2020-10-13 DIAGNOSIS — I5032 Chronic diastolic (congestive) heart failure: Secondary | ICD-10-CM

## 2020-10-13 DIAGNOSIS — F339 Major depressive disorder, recurrent, unspecified: Secondary | ICD-10-CM

## 2020-10-13 DIAGNOSIS — K219 Gastro-esophageal reflux disease without esophagitis: Secondary | ICD-10-CM

## 2020-10-18 ENCOUNTER — Ambulatory Visit (INDEPENDENT_AMBULATORY_CARE_PROVIDER_SITE_OTHER): Payer: Medicare HMO

## 2020-10-18 DIAGNOSIS — I4891 Unspecified atrial fibrillation: Secondary | ICD-10-CM

## 2020-10-19 NOTE — Progress Notes (Signed)
Home INR was 2.8.  Per Lovena Le pt can keep taking medication as before.  No changes

## 2020-10-20 ENCOUNTER — Other Ambulatory Visit: Payer: Self-pay

## 2020-10-20 DIAGNOSIS — G2581 Restless legs syndrome: Secondary | ICD-10-CM

## 2020-10-20 DIAGNOSIS — I5032 Chronic diastolic (congestive) heart failure: Secondary | ICD-10-CM

## 2020-10-20 DIAGNOSIS — K219 Gastro-esophageal reflux disease without esophagitis: Secondary | ICD-10-CM

## 2020-10-20 DIAGNOSIS — F339 Major depressive disorder, recurrent, unspecified: Secondary | ICD-10-CM

## 2020-10-20 MED ORDER — CITALOPRAM HYDROBROMIDE 40 MG PO TABS: 40.0000 mg | ORAL_TABLET | Freq: Every day | ORAL | 1 refills | Status: DC

## 2020-10-20 MED ORDER — PANTOPRAZOLE SODIUM 40 MG PO TBEC: 40.0000 mg | DELAYED_RELEASE_TABLET | Freq: Two times a day (BID) | ORAL | 1 refills | Status: DC

## 2020-10-20 MED ORDER — ROPINIROLE HCL 0.5 MG PO TABS: 0.5000 mg | ORAL_TABLET | Freq: Every evening | ORAL | 1 refills | Status: DC

## 2020-10-20 MED ORDER — FUROSEMIDE 20 MG PO TABS: 20.0000 mg | ORAL_TABLET | Freq: Every day | ORAL | 1 refills | Status: DC

## 2020-10-25 ENCOUNTER — Ambulatory Visit: Payer: Medicare HMO | Admitting: Hospice and Palliative Medicine

## 2020-10-25 ENCOUNTER — Telehealth: Payer: Self-pay

## 2020-10-25 NOTE — Telephone Encounter (Signed)
[name] to reschedule missed ov from 10-25-20. [name]

## 2020-10-29 DIAGNOSIS — Z7901 Long term (current) use of anticoagulants: Secondary | ICD-10-CM | POA: Diagnosis not present

## 2020-10-29 LAB — PROTIME-INR

## 2020-11-01 ENCOUNTER — Other Ambulatory Visit: Payer: Self-pay

## 2020-11-01 ENCOUNTER — Ambulatory Visit (INDEPENDENT_AMBULATORY_CARE_PROVIDER_SITE_OTHER): Payer: Medicare HMO

## 2020-11-01 ENCOUNTER — Telehealth: Payer: Self-pay

## 2020-11-01 DIAGNOSIS — Z7901 Long term (current) use of anticoagulants: Secondary | ICD-10-CM | POA: Diagnosis not present

## 2020-11-01 MED ORDER — WARFARIN SODIUM 5 MG PO TABS: 5.0000 mg | ORAL_TABLET | Freq: Every day | ORAL | 3 refills | Status: DC

## 2020-11-01 NOTE — Telephone Encounter (Signed)
lmom to call us back

## 2020-11-01 NOTE — Progress Notes (Signed)
Pt INR 5.6 at 10/29/20 and we recheck again today was INR 3.5 as per dr Humphrey Rolls advised pt daughter that skip today and also we change med to coumadin 5 mg daily and recheck in 1 week and also advised pt daughter need follow up appt

## 2020-11-09 LAB — PROTIME-INR

## 2020-11-10 ENCOUNTER — Ambulatory Visit (INDEPENDENT_AMBULATORY_CARE_PROVIDER_SITE_OTHER): Payer: Medicare HMO

## 2020-11-10 DIAGNOSIS — Z7901 Long term (current) use of anticoagulants: Secondary | ICD-10-CM

## 2020-11-10 NOTE — Progress Notes (Signed)
Pt inr 4.6 as per dr Humphrey Rolls advised pt daughter that skip today dose and for now skip every Wednesday and take coumadin 5 mg rest of the week

## 2020-11-17 ENCOUNTER — Ambulatory Visit (INDEPENDENT_AMBULATORY_CARE_PROVIDER_SITE_OTHER): Payer: Medicare HMO

## 2020-11-17 DIAGNOSIS — Z7901 Long term (current) use of anticoagulants: Secondary | ICD-10-CM

## 2020-11-17 NOTE — Progress Notes (Signed)
Pt INR 6.5 as per dr Humphrey Rolls pt daughter advised her to stopped coumadin  for 3 days and go back to normal dosing and we can see her  Tuesday in office

## 2020-11-23 ENCOUNTER — Encounter: Payer: Self-pay | Admitting: Internal Medicine

## 2020-11-23 ENCOUNTER — Telehealth: Payer: Self-pay

## 2020-11-23 ENCOUNTER — Ambulatory Visit (INDEPENDENT_AMBULATORY_CARE_PROVIDER_SITE_OTHER): Payer: Medicare HMO | Admitting: Internal Medicine

## 2020-11-23 VITALS — BP 142/40 | HR 72 | Temp 98.0°F | Resp 16 | Ht <= 58 in | Wt 127.2 lb

## 2020-11-23 DIAGNOSIS — I480 Paroxysmal atrial fibrillation: Secondary | ICD-10-CM | POA: Diagnosis not present

## 2020-11-23 DIAGNOSIS — R030 Elevated blood-pressure reading, without diagnosis of hypertension: Secondary | ICD-10-CM | POA: Diagnosis not present

## 2020-11-23 DIAGNOSIS — E1165 Type 2 diabetes mellitus with hyperglycemia: Secondary | ICD-10-CM

## 2020-11-23 DIAGNOSIS — E11649 Type 2 diabetes mellitus with hypoglycemia without coma: Secondary | ICD-10-CM | POA: Diagnosis not present

## 2020-11-23 DIAGNOSIS — D6489 Other specified anemias: Secondary | ICD-10-CM | POA: Diagnosis not present

## 2020-11-23 DIAGNOSIS — Z7901 Long term (current) use of anticoagulants: Secondary | ICD-10-CM | POA: Diagnosis not present

## 2020-11-23 DIAGNOSIS — R5383 Other fatigue: Secondary | ICD-10-CM | POA: Diagnosis not present

## 2020-11-23 DIAGNOSIS — N1832 Chronic kidney disease, stage 3b: Secondary | ICD-10-CM

## 2020-11-23 LAB — CBC WITH DIFFERENTIAL/PLATELET
Basophils Absolute: 0.1 10*3/uL (ref 0.0–0.2)
Basos: 1 %
EOS (ABSOLUTE): 0 10*3/uL (ref 0.0–0.4)
Eos: 0 %
Hematocrit: 20.3 % — ABNORMAL LOW (ref 34.0–46.6)
Hemoglobin: 5.9 g/dL — CL (ref 11.1–15.9)
Immature Grans (Abs): 0.1 10*3/uL (ref 0.0–0.1)
Immature Granulocytes: 1 %
Lymphocytes Absolute: 1.7 10*3/uL (ref 0.7–3.1)
Lymphs: 16 %
MCH: 21.9 pg — ABNORMAL LOW (ref 26.6–33.0)
MCHC: 29.1 g/dL — ABNORMAL LOW (ref 31.5–35.7)
MCV: 75 fL — ABNORMAL LOW (ref 79–97)
Monocytes Absolute: 0.7 10*3/uL (ref 0.1–0.9)
Monocytes: 6 %
Neutrophils Absolute: 8.1 10*3/uL — ABNORMAL HIGH (ref 1.4–7.0)
Neutrophils: 76 %
Platelets: 287 10*3/uL (ref 150–450)
RBC: 2.7 x10E6/uL — CL (ref 3.77–5.28)
RDW: 17.7 % — ABNORMAL HIGH (ref 11.7–15.4)
WBC: 10.7 10*3/uL (ref 3.4–10.8)

## 2020-11-23 LAB — COMP. METABOLIC PANEL (12)
AST: 12 IU/L (ref 0–40)
Albumin/Globulin Ratio: 0.8 — ABNORMAL LOW (ref 1.2–2.2)
Albumin: 3.1 g/dL — ABNORMAL LOW (ref 3.7–4.7)
Alkaline Phosphatase: 100 IU/L (ref 44–121)
BUN/Creatinine Ratio: 16 (ref 12–28)
BUN: 40 mg/dL — ABNORMAL HIGH (ref 8–27)
Bilirubin Total: 0.3 mg/dL (ref 0.0–1.2)
Calcium: 8.4 mg/dL — ABNORMAL LOW (ref 8.7–10.3)
Chloride: 100 mmol/L (ref 96–106)
Creatinine, Ser: 2.45 mg/dL — ABNORMAL HIGH (ref 0.57–1.00)
GFR calc Af Amer: 21 mL/min/{1.73_m2} — ABNORMAL LOW (ref >59)
GFR calc non Af Amer: 18 mL/min/{1.73_m2} — ABNORMAL LOW (ref >59)
Globulin, Total: 3.7 g/dL (ref 1.5–4.5)
Glucose: 227 mg/dL — ABNORMAL HIGH (ref 65–99)
Potassium: 4.5 mmol/L (ref 3.5–5.2)
Sodium: 132 mmol/L — ABNORMAL LOW (ref 134–144)
Total Protein: 6.8 g/dL (ref 6.0–8.5)

## 2020-11-23 LAB — POCT GLYCOSYLATED HEMOGLOBIN (HGB A1C): Hemoglobin A1C: 8.2 % — AB (ref 4.0–5.6)

## 2020-11-23 LAB — POCT INR
INR: 1.6 — AB (ref 2.0–3.0)
Prothrombin Time: 19.4

## 2020-11-23 NOTE — Progress Notes (Signed)
[facility] [place], [place] [address]  Internal MEDICINE  Office Visit Note  Patient Name: [name]  [id number]  [id number]  Date of Service: 12/02/2020  Chief Complaint  Patient presents with  . Fatigue    For 2-3 days now that comes and goes  . abnormal INR  . Results    Ultrasound of carotid    HPI Pt is here for follow up. She has been living with her daughter, since August her hg has been low and our office has attempted to make several appointments but they have been cancelled, She does have complex past medical history  she continues to be on coumadin for atrial fib. H/O CHF, diastolic dysfunction, has OSA and hypoxia but is not on any therapy at the moment.  Right nephrectomy now has CKD. Diabetes is not controlled either  Recent carotid u/s was abnormal however CT angiogram is not indicated due to worsening renal functions. She is c/o fatigue and looks tired here in the office, daughter is in the room as well   Current Medication: Outpatient Encounter Medications as of 11/23/2020  Medication Sig  . ACCU-CHEK FASTCLIX LANCETS MISC   . acetaminophen (TYLENOL) 500 MG tablet Take by mouth.  Marland Kitchen acetaminophen-codeine (TYLENOL #3) 300-30 MG tablet Take 1 tablet by mouth every 6 (six) hours as needed for moderate pain.  . Alcohol Swabs (B-D SINGLE USE SWABS REGULAR) PADS USE AS DIRECTED TWICE DAILY  . atorvastatin (LIPITOR) 10 MG tablet Take 1 tablet (10 mg total) by mouth every evening.  . bisacodyl (DULCOLAX) 5 MG EC tablet Take 1 tablet (5 mg total) by mouth daily as needed for moderate constipation.  . citalopram (CELEXA) 40 MG tablet Take 1 tablet (40 mg total) by mouth daily.  . digoxin (LANOXIN) 0.125 MG tablet Take 1 tablet (0.125 mg total) by mouth daily.  Mariane Baumgarten Sodium (DSS) 100 MG CAPS Take by mouth.  . donepezil (ARICEPT) 5 MG tablet TAKE 1 TABLET BY MOUTH ONCE DAILY FOR MEMORY  . DROPLET PEN NEEDLES 32G X 4 MM MISC USE AS DIRECTED   . ergocalciferol (VITAMIN D2) 1.25 MG (50000 UT) capsule Take by mouth.  . fluticasone (FLONASE) 50 MCG/ACT nasal spray Place 2 sprays into both nostrils daily.  . furosemide (LASIX) 20 MG tablet Take 1 tablet (20 mg total) by mouth daily.  Marland Kitchen glucose blood test strip Use as instructed to check blood sugars twice a day.  E11.65  . glucose blood test strip   . insulin glargine, 2 Unit Dial, (TOUJEO MAX SOLOSTAR) 300 UNIT/ML Solostar Pen Inject 14 Units into the skin daily.  Marland Kitchen liraglutide (VICTOZA) 18 MG/3ML SOPN Inject 0.3 mLs (1.8 mg total) into the skin daily.  . pantoprazole (PROTONIX) 40 MG tablet Take 1 tablet (40 mg total) by mouth 2 (two) times daily.  Marland Kitchen rOPINIRole (REQUIP) 0.5 MG tablet Take 1 tablet (0.5 mg total) by mouth every evening.  . traMADol (ULTRAM) 50 MG tablet Take 1 tablet (50 mg total) by mouth 2 (two) times daily as needed. for pain  . warfarin (COUMADIN) 5 MG tablet Take 1 tablet (5 mg total) by mouth daily.   No facility-administered encounter medications on file as of 11/23/2020.    Surgical History: Past Surgical History:  Procedure Laterality Date  . ABDOMINAL HYSTERECTOMY    . APPENDECTOMY    . CATARACT EXTRACTION    . CHOLECYSTECTOMY    . HERNIA REPAIR    . right knee  replacement    . right nephroectomy      Medical History: Past Medical History:  Diagnosis Date  . Atrial fibrillation ([place])   . Congestive heart failure (CHF) ([place])   . Diabetes mellitus without complication ([place])   . GERD (gastroesophageal reflux disease)   . Hyperlipidemia   . Hypertension     Family History: Family History  Problem Relation Age of Onset  . Breast cancer Mother     Social History   Socioeconomic History  . Marital status: Widowed    Spouse name: Not on file  . Number of children: Not on file  . Years of education: Not on file  . Highest education level: Not on file  Occupational History  . Not on file  Tobacco Use  . Smoking status: Current Every Day  Smoker    Packs/day: 1.00    Types: Cigarettes    Last attempt to quit: 01/03/2019    Years since quitting: 1.9  . Smokeless tobacco: Never Used  Vaping Use  . Vaping Use: Never used  Substance and Sexual Activity  . Alcohol use: No  . Drug use: No  . Sexual activity: Not on file  Other Topics Concern  . Not on file  Social History Narrative  . Not on file   Social Determinants of Health   Financial Resource Strain: Not on file  Food Insecurity: Not on file  Transportation Needs: Not on file  Physical Activity: Not on file  Stress: Not on file  Social Connections: Not on file  Intimate Partner Violence: Not on file      Review of Systems  Constitutional: Positive for fatigue and unexpected weight change. Negative for chills and diaphoresis.  HENT: Negative for ear pain, postnasal drip and sinus pressure.   Eyes: Negative for photophobia, discharge, redness, itching and visual disturbance.  Respiratory: Positive for shortness of breath. Negative for cough and wheezing.   Cardiovascular: Negative for chest pain, palpitations and leg swelling.  Gastrointestinal: Negative for abdominal pain, constipation, diarrhea, nausea and vomiting.  Genitourinary: Negative for dysuria and flank pain.  Musculoskeletal: Negative for arthralgias, back pain, gait problem and neck pain.  Skin: Negative for color change.  Allergic/Immunologic: Negative for environmental allergies and food allergies.  Neurological: Negative for dizziness and headaches.  Hematological: Does not bruise/bleed easily.  Psychiatric/Behavioral: Negative for agitation, behavioral problems (depression) and hallucinations.    Vital Signs: BP (!) 142/40   Pulse 72   Temp 98 F (36.7 C)   Resp 16   Ht 4\' 10"  (1.473 m)   Wt 127 lb 3.2 oz (57.7 kg)   SpO2 99%   BMI 26.58 kg/m    Physical Exam Vitals reviewed.  Constitutional:      General: She is not in acute distress.    Appearance: She is well-developed.  She is ill-appearing. She is not diaphoretic.  HENT:     Head: Normocephalic and atraumatic.     Mouth/Throat:     Pharynx: No oropharyngeal exudate.  Eyes:     Pupils: Pupils are equal, round, and reactive to light.  Neck:     Thyroid: No thyromegaly.     Vascular: No JVD.     Trachea: No tracheal deviation.  Cardiovascular:     Rate and Rhythm: Normal rate and regular rhythm.     Heart sounds: Normal heart sounds. No murmur heard. No friction rub. No gallop.   Pulmonary:     Effort: Pulmonary effort is normal. No respiratory  distress.     Breath sounds: No wheezing or rales.  Chest:     Chest wall: No tenderness.  Abdominal:     General: Bowel sounds are normal.     Palpations: Abdomen is soft.  Musculoskeletal:        General: Normal range of motion.     Cervical back: Normal range of motion and neck supple.  Lymphadenopathy:     Cervical: No cervical adenopathy.  Skin:    General: Skin is dry.     Coloration: Skin is pale.  Neurological:     Mental Status: She is alert and oriented to person, place, and time.     Cranial Nerves: No cranial nerve deficit.  Psychiatric:        Behavior: Behavior normal.        Thought Content: Thought content normal.        Judgment: Judgment normal.     Assessment/Plan: 1. Anemia due to other cause, not classified Stat cbc is ordered.pt was contacted back with results for hg 5.9 and sent to Same day surgery for blood transfusion of 2 units, she also had worsening in her renal functions  - CBC with Differential/Platelet - Comp. Metabolic Panel (12)  2. Encounter for current long-term use of anticoagulants Hold coumadin for now  - POCT INR - CBC with Differential/Platelet - Comp. Metabolic Panel (12)  3. AF (paroxysmal atrial fibrillation) (HCC) Continue Lanoxin  4. Uncontrolled type 2 diabetes mellitus with hyperglycemia ([place]) meds will be adjusted at next visit  - POCT glycosylated hemoglobin (Hb A1C) - CBC with  Differential/Platelet - Comp. Metabolic Panel (12)  5. Elevated blood pressure reading repeat blood pressure is improved   6. Other fatigue Can be related to Anemia  - CBC with Differential/Platelet - Comp. Metabolic Panel (12)  7. Stage 3b chronic kidney disease (HCC) S/p unilateral nephrectomy. Will need reduction in her diuretics and better diabetic control   General Counseling: Alayzha verbalizes understanding of the findings of todays visit and agrees with plan of treatment. I have discussed any further diagnostic evaluation that may be needed or ordered today. We also reviewed her medications today. she has been encouraged to call the office with any questions or concerns that should arise related to todays visit.   Orders Placed This Encounter  Procedures  . CBC with Differential/Platelet  . Comp. Metabolic Panel (12)  . POCT INR  . POCT glycosylated hemoglobin (Hb A1C)  . EKG 12-Lead    Total time spent:45  Minutes  Time spent includes review of chart, medications, test results, and follow up plan with the patient.  Complex medical problems with high level of critical thinking involved in decison making  [place] Controlled Substance Database was reviewed by me.   Dr Lavera Guise Internal medicine

## 2020-11-23 NOTE — Telephone Encounter (Signed)
[facility] called for stat lab pt hemoglobin 5.9 gave message to dr Humphrey Rolls

## 2020-11-23 NOTE — Telephone Encounter (Signed)
Per Dr Humphrey Rolls I called same day surgery and gave orders for pt to have infusion, spoke to [place] at [phone]: Hemoglobin level SOB ORDERS Type and cross 2 units Transfuse each over 4 hours Give Lasix 20 mg in between 2 units Repeat CBC and BMP after infusion  Spoke to patients daughter and she informed me that patient will be going into the [facility] at 9 am on 11/24/2020.  I informed pt's daughter if pt gets any worse to take to ER.

## 2020-11-24 ENCOUNTER — Other Ambulatory Visit: Payer: Self-pay

## 2020-11-24 ENCOUNTER — Ambulatory Visit
Admission: RE | Admit: 2020-11-24 | Discharge: 2020-11-24 | Disposition: A | Discharge status: Home / Self Care | Payer: Medicare HMO | Source: Ambulatory Visit | Attending: Internal Medicine | Admitting: Internal Medicine

## 2020-11-24 DIAGNOSIS — Z79899 Other long term (current) drug therapy: Secondary | ICD-10-CM | POA: Insufficient documentation

## 2020-11-24 DIAGNOSIS — Z7901 Long term (current) use of anticoagulants: Secondary | ICD-10-CM | POA: Diagnosis not present

## 2020-11-24 DIAGNOSIS — R030 Elevated blood-pressure reading, without diagnosis of hypertension: Secondary | ICD-10-CM | POA: Insufficient documentation

## 2020-11-24 DIAGNOSIS — D6489 Other specified anemias: Secondary | ICD-10-CM | POA: Insufficient documentation

## 2020-11-24 DIAGNOSIS — R5383 Other fatigue: Secondary | ICD-10-CM | POA: Insufficient documentation

## 2020-11-24 DIAGNOSIS — E11649 Type 2 diabetes mellitus with hypoglycemia without coma: Secondary | ICD-10-CM | POA: Insufficient documentation

## 2020-11-24 DIAGNOSIS — Z5181 Encounter for therapeutic drug level monitoring: Secondary | ICD-10-CM | POA: Diagnosis not present

## 2020-11-24 LAB — BASIC METABOLIC PANEL
Anion gap: 11 (ref 5–15)
BUN: 45 mg/dL — ABNORMAL HIGH (ref 8–23)
CO2: 20 mmol/L — ABNORMAL LOW (ref 22–32)
Calcium: 8.5 mg/dL — ABNORMAL LOW (ref 8.9–10.3)
Chloride: 103 mmol/L (ref 98–111)
Creatinine, Ser: 2.47 mg/dL — ABNORMAL HIGH (ref 0.44–1.00)
GFR, Estimated: 19 mL/min — ABNORMAL LOW (ref >60)
Glucose, Bld: 72 mg/dL (ref 70–99)
Potassium: 3.8 mmol/L (ref 3.5–5.1)
Sodium: 134 mmol/L — ABNORMAL LOW (ref 135–145)

## 2020-11-24 LAB — CBC
HCT: 27.3 % — ABNORMAL LOW (ref 36.0–46.0)
Hemoglobin: 8.4 g/dL — ABNORMAL LOW (ref 12.0–15.0)
MCH: 24.3 pg — ABNORMAL LOW (ref 26.0–34.0)
MCHC: 30.8 g/dL (ref 30.0–36.0)
MCV: 79.1 fL — ABNORMAL LOW (ref 80.0–100.0)
Platelets: 257 10*3/uL (ref 150–400)
RBC: 3.45 MIL/uL — ABNORMAL LOW (ref 3.87–5.11)
RDW: 18.9 % — ABNORMAL HIGH (ref 11.5–15.5)
WBC: 11.4 10*3/uL — ABNORMAL HIGH (ref 4.0–10.5)
nRBC: 0.2 % (ref 0.0–0.2)

## 2020-11-24 LAB — ABO/RH: ABO/RH(D): O POS

## 2020-11-24 LAB — GLUCOSE, CAPILLARY
Glucose-Capillary: 305 mg/dL — ABNORMAL HIGH (ref 70–99)
Glucose-Capillary: 314 mg/dL — ABNORMAL HIGH (ref 70–99)

## 2020-11-24 LAB — PREPARE RBC (CROSSMATCH)

## 2020-11-24 MED ORDER — INSULIN ASPART 100 UNIT/ML ~~LOC~~ SOLN
SUBCUTANEOUS | Status: AC
  Administered 2020-11-24: 10 [IU] via SUBCUTANEOUS
  Filled 2020-11-24: qty 1

## 2020-11-24 MED ORDER — INSULIN ASPART 100 UNIT/ML ~~LOC~~ SOLN: 10.0000 [IU] | Freq: Once | SUBCUTANEOUS | Status: AC

## 2020-11-24 MED ORDER — FUROSEMIDE 10 MG/ML IJ SOLN: 20.0000 mg | Freq: Once | INTRAMUSCULAR | Status: AC

## 2020-11-24 MED ORDER — FUROSEMIDE 10 MG/ML IJ SOLN
INTRAMUSCULAR | Status: AC
  Administered 2020-11-24: 20 mg via INTRAVENOUS
  Filled 2020-11-24: qty 2

## 2020-11-24 NOTE — Discharge Instructions (Signed)

## 2020-11-24 NOTE — OR Nursing (Signed)
Blood sugar is 305. Patient reports she took her insulin this am. Will recheck later.

## 2020-11-24 NOTE — Progress Notes (Signed)
Pt is sent to same day surgery for blood transfusion, will need to see GI

## 2020-11-25 LAB — TYPE AND SCREEN
ABO/RH(D): O POS
Antibody Screen: NEGATIVE
Unit division: 0
Unit division: 0

## 2020-11-25 LAB — BPAM RBC
Blood Product Expiration Date: 202203032359
Blood Product Expiration Date: 202203032359
ISSUE DATE / TIME: 202202021044
ISSUE DATE / TIME: 202202021321
Unit Type and Rh: 5100
Unit Type and Rh: 5100

## 2020-11-26 ENCOUNTER — Telehealth: Payer: Self-pay

## 2020-11-26 NOTE — Progress Notes (Signed)
Please advise them that they need to bring all her medications

## 2020-11-26 NOTE — Telephone Encounter (Signed)
Spoke with Pt daughter advised her to bring all her med

## 2020-11-26 NOTE — Telephone Encounter (Signed)
-----   Message from Lavera Guise, MD sent at 11/26/2020  7:16 AM EST ----- Please advise them that they need to bring all her medications

## 2020-11-29 ENCOUNTER — Ambulatory Visit (INDEPENDENT_AMBULATORY_CARE_PROVIDER_SITE_OTHER): Payer: Medicare HMO

## 2020-11-29 DIAGNOSIS — Z7901 Long term (current) use of anticoagulants: Secondary | ICD-10-CM

## 2020-11-29 NOTE — Progress Notes (Signed)
Pt INR 3.4 as per dr Humphrey Rolls advised daughter skip today  Go back to normal dosing

## 2020-12-02 LAB — PROTIME-INR

## 2020-12-06 ENCOUNTER — Ambulatory Visit (INDEPENDENT_AMBULATORY_CARE_PROVIDER_SITE_OTHER): Payer: Medicare HMO | Admitting: Physician Assistant

## 2020-12-06 ENCOUNTER — Other Ambulatory Visit: Payer: Self-pay

## 2020-12-06 ENCOUNTER — Encounter: Payer: Self-pay | Admitting: Physician Assistant

## 2020-12-06 DIAGNOSIS — R030 Elevated blood-pressure reading, without diagnosis of hypertension: Secondary | ICD-10-CM

## 2020-12-06 DIAGNOSIS — D509 Iron deficiency anemia, unspecified: Secondary | ICD-10-CM

## 2020-12-06 DIAGNOSIS — I4891 Unspecified atrial fibrillation: Secondary | ICD-10-CM | POA: Diagnosis not present

## 2020-12-06 DIAGNOSIS — E1165 Type 2 diabetes mellitus with hyperglycemia: Secondary | ICD-10-CM

## 2020-12-06 DIAGNOSIS — R5383 Other fatigue: Secondary | ICD-10-CM | POA: Diagnosis not present

## 2020-12-06 DIAGNOSIS — Z7901 Long term (current) use of anticoagulants: Secondary | ICD-10-CM

## 2020-12-06 DIAGNOSIS — N1832 Chronic kidney disease, stage 3b: Secondary | ICD-10-CM

## 2020-12-06 DIAGNOSIS — E611 Iron deficiency: Secondary | ICD-10-CM | POA: Diagnosis not present

## 2020-12-06 DIAGNOSIS — I5032 Chronic diastolic (congestive) heart failure: Secondary | ICD-10-CM

## 2020-12-06 DIAGNOSIS — E782 Mixed hyperlipidemia: Secondary | ICD-10-CM

## 2020-12-06 DIAGNOSIS — F0150 Vascular dementia without behavioral disturbance: Secondary | ICD-10-CM

## 2020-12-06 LAB — POCT INR: INR: 5.7 — AB (ref 2.0–3.0)

## 2020-12-06 MED ORDER — ATORVASTATIN CALCIUM 10 MG PO TABS: 10.0000 mg | ORAL_TABLET | Freq: Every evening | ORAL | 1 refills | Status: DC

## 2020-12-06 MED ORDER — DONEPEZIL HCL 5 MG PO TABS
ORAL_TABLET | ORAL | 2 refills | Status: DC
Start: 2020-12-06 — End: 2021-06-30

## 2020-12-06 MED ORDER — DIGOXIN 125 MCG PO TABS: 0.1250 mg | ORAL_TABLET | Freq: Every day | ORAL | 1 refills | Status: DC

## 2020-12-06 MED ORDER — GLIMEPIRIDE 2 MG PO TABS: ORAL_TABLET | ORAL | 2 refills | Status: DC

## 2020-12-06 MED ORDER — WARFARIN SODIUM 2 MG PO TABS: 2.0000 mg | ORAL_TABLET | Freq: Every day | ORAL | 3 refills | Status: DC

## 2020-12-06 NOTE — Progress Notes (Signed)
[facility] [place], [place] [address]  Internal MEDICINE  Office Visit Note  Patient Name: [name]  [id number]  [id number]  Date of Service: 12/07/2020  Chief Complaint  Patient presents with  . Follow-up    Discuss meds/refill, leg pain  . Hyperlipidemia  . Hypertension  . Gastroesophageal Reflux  . Diabetes    HPI Pt returns for 2 week f/u s/p blood transfusion with her daughter who manages her medication. Last visit she appeared pale, SOB and fatigued. A stat CBC showed low hemoglobin of 5.9. She was sent to same day surgery center and received 2 units of blood and her hemoglobin improved to 8.4 the next day. She states she is feeling better since getting the transfusion, but is not 100%. Per record her hemoglobin has been low since August. She does mention that she has been eating more ice lately. -She has a hx CHF with diastolic dysfunction, and OSa with hypoxia but is not on therapy currently. She also has a hx of afib for which she takes coumadin. She was told to hold her coumadin for 3 days, Thurs, Fri, Sat and then restarted on 5mg  per day for the past week. INR today is 5.7. Will hold coumadin for 2 days then start 2mg  daily. -She has hx of Right nephrectomy and now has CKD, with poorly controled DM. -DM: She is currently taking Victoza 1.2 and 14 units toujeo, but A1c was 8.2 last visit and needs improvement.  Current Medication: Outpatient Encounter Medications as of 12/06/2020  Medication Sig  . glimepiride (AMARYL) 2 MG tablet Take 1 tab before biggest meal of the day  . warfarin (COUMADIN) 2 MG tablet Take 1 tablet (2 mg total) by mouth daily.  Marland Kitchen ACCU-CHEK FASTCLIX LANCETS MISC   . acetaminophen (TYLENOL) 500 MG tablet Take by mouth.  Marland Kitchen acetaminophen-codeine (TYLENOL #3) 300-30 MG tablet Take 1 tablet by mouth every 6 (six) hours as needed for moderate pain.  . Alcohol Swabs (B-D SINGLE USE SWABS REGULAR) PADS USE AS DIRECTED TWICE  DAILY  . atorvastatin (LIPITOR) 10 MG tablet Take 1 tablet (10 mg total) by mouth every evening.  . bisacodyl (DULCOLAX) 5 MG EC tablet Take 1 tablet (5 mg total) by mouth daily as needed for moderate constipation.  . citalopram (CELEXA) 40 MG tablet Take 1 tablet (40 mg total) by mouth daily.  . digoxin (LANOXIN) 0.125 MG tablet Take 1 tablet (0.125 mg total) by mouth daily.  Mariane Baumgarten Sodium (DSS) 100 MG CAPS Take by mouth.  . donepezil (ARICEPT) 5 MG tablet TAKE 1 TABLET BY MOUTH ONCE DAILY FOR MEMORY  . DROPLET PEN NEEDLES 32G X 4 MM MISC USE AS DIRECTED  . ergocalciferol (VITAMIN D2) 1.25 MG (50000 UT) capsule Take by mouth.  . fluticasone (FLONASE) 50 MCG/ACT nasal spray Place 2 sprays into both nostrils daily.  . furosemide (LASIX) 20 MG tablet Take 1 tablet (20 mg total) by mouth daily.  Marland Kitchen glucose blood test strip Use as instructed to check blood sugars twice a day.  E11.65  . glucose blood test strip   . insulin glargine, 2 Unit Dial, (TOUJEO MAX SOLOSTAR) 300 UNIT/ML Solostar Pen Inject 14 Units into the skin daily. (Patient taking differently: Inject 10 Units into the skin daily.)  . pantoprazole (PROTONIX) 40 MG tablet Take 1 tablet (40 mg total) by mouth 2 (two) times daily.  Marland Kitchen rOPINIRole (REQUIP) 0.5 MG tablet Take 1 tablet (0.5 mg total) by  mouth every evening.  . traMADol (ULTRAM) 50 MG tablet Take 1 tablet (50 mg total) by mouth 2 (two) times daily as needed. for pain  . [DISCONTINUED] atorvastatin (LIPITOR) 10 MG tablet Take 1 tablet (10 mg total) by mouth every evening.  . [DISCONTINUED] digoxin (LANOXIN) 0.125 MG tablet Take 1 tablet (0.125 mg total) by mouth daily.  . [DISCONTINUED] donepezil (ARICEPT) 5 MG tablet TAKE 1 TABLET BY MOUTH ONCE DAILY FOR MEMORY  . [DISCONTINUED] liraglutide (VICTOZA) 18 MG/3ML SOPN Inject 0.3 mLs (1.8 mg total) into the skin daily. (Patient not taking: Reported on 12/07/2020)  . [DISCONTINUED] warfarin (COUMADIN) 5 MG tablet Take 1 tablet (5 mg  total) by mouth daily.   No facility-administered encounter medications on file as of 12/06/2020.    Surgical History: Past Surgical History:  Procedure Laterality Date  . ABDOMINAL HYSTERECTOMY    . APPENDECTOMY    . CATARACT EXTRACTION    . CHOLECYSTECTOMY    . HERNIA REPAIR    . right knee replacement    . right nephroectomy      Medical History: Past Medical History:  Diagnosis Date  . Anemia   . Atrial fibrillation ([place])   . Congestive heart failure (CHF) ([place])   . Diabetes mellitus without complication ([place])   . GERD (gastroesophageal reflux disease)   . Hyperlipidemia   . Hypertension     Family History: Family History  Problem Relation Age of Onset  . Breast cancer Mother     Social History   Socioeconomic History  . Marital status: Widowed    Spouse name: Not on file  . Number of children: Not on file  . Years of education: Not on file  . Highest education level: Not on file  Occupational History  . Not on file  Tobacco Use  . Smoking status: Current Every Day Smoker    Packs/day: 1.00    Types: Cigarettes    Last attempt to quit: 01/03/2019    Years since quitting: 1.9  . Smokeless tobacco: Never Used  Vaping Use  . Vaping Use: Never used  Substance and Sexual Activity  . Alcohol use: No  . Drug use: No  . Sexual activity: Not on file  Other Topics Concern  . Not on file  Social History Narrative  . Not on file   Social Determinants of Health   Financial Resource Strain: Not on file  Food Insecurity: Not on file  Transportation Needs: Not on file  Physical Activity: Not on file  Stress: Not on file  Social Connections: Not on file  Intimate Partner Violence: Not on file      Review of Systems  Constitutional: Positive for fatigue and unexpected weight change. Negative for chills.  HENT: Negative for congestion, postnasal drip, rhinorrhea, sneezing and sore throat.   Eyes: Negative for redness.  Respiratory: Negative for cough,  chest tightness and shortness of breath.   Cardiovascular: Negative for chest pain and palpitations.  Gastrointestinal: Negative for abdominal pain, constipation, diarrhea, nausea and vomiting.  Genitourinary: Negative for dysuria and frequency.  Musculoskeletal: Negative for arthralgias, back pain, joint swelling and neck pain.  Skin: Negative for rash.  Neurological: Negative.  Negative for tremors and numbness.  Hematological: Negative for adenopathy. Does not bruise/bleed easily.  Psychiatric/Behavioral: Negative for behavioral problems (Depression), sleep disturbance and suicidal ideas. The patient is not nervous/anxious.     Vital Signs: BP (!) 154/38   Pulse 72   Temp (!) 97.4 F (36.3 C)  Resp 16   Ht 4\' 10"  (1.473 m)   Wt 124 lb 3.2 oz (56.3 kg)   SpO2 98%   BMI 25.96 kg/m    Physical Exam Constitutional:      General: She is not in acute distress.    Appearance: She is well-developed. She is ill-appearing. She is not diaphoretic.     Comments: Pt still does not look completely recovered and is pale appearing  HENT:     Head: Normocephalic and atraumatic.     Mouth/Throat:     Pharynx: No oropharyngeal exudate.  Eyes:     Pupils: Pupils are equal, round, and reactive to light.  Neck:     Thyroid: No thyromegaly.     Vascular: No JVD.     Trachea: No tracheal deviation.  Cardiovascular:     Rate and Rhythm: Normal rate and regular rhythm.     Heart sounds: Normal heart sounds. No murmur heard. No friction rub. No gallop.   Pulmonary:     Effort: Pulmonary effort is normal. No respiratory distress.     Breath sounds: No wheezing or rales.  Chest:     Chest wall: No tenderness.  Abdominal:     General: Bowel sounds are normal.     Palpations: Abdomen is soft.  Musculoskeletal:        General: Normal range of motion.     Cervical back: Normal range of motion and neck supple.  Lymphadenopathy:     Cervical: No cervical adenopathy.  Skin:    General: Skin  is warm and dry.     Coloration: Skin is pale.  Neurological:     Mental Status: She is alert and oriented to person, place, and time.     Cranial Nerves: No cranial nerve deficit.  Psychiatric:        Behavior: Behavior normal.        Thought Content: Thought content normal.        Judgment: Judgment normal.        Assessment/Plan: 1. Iron deficiency anemia, unspecified iron deficiency anemia type Chronically low hemoglobin since August and pt is now craving ice. CBC improved since 2 units of blood given 2 weeks ago. Hemoglobin improved from 5.9 to 8.4 after transfusion, and to 9.5 on today's check. Ordered iron studies showing critically low iron. Pt will need to supplement iron and vitamin C.  2. Encounter for current long-term use of anticoagulants - POCT INR 5.7 today. Will hold coumadin for 2 days then restart on 2mg  per day.  3. Atrial fibrillation, unspecified type (HCC) Continue Lanoxin. Hold coumadin for 2 days then restart on 2mg  per day. - digoxin (LANOXIN) 0.125 MG tablet; Take 1 tablet (0.125 mg total) by mouth daily.  Dispense: 90 tablet; Refill: 1 - warfarin (COUMADIN) 2 MG tablet; Take 1 tablet (2 mg total) by mouth daily.  Dispense: 30 tablet; Refill: 3  4. Uncontrolled type 2 diabetes mellitus with hyperglycemia ([place]) Will stop Victoza. Continue Toujeo 14 units with supper. Pt may increase by 1 unit for every morning BG above 250, not to exceed 20units per day. Will also start Glimepiride 2 mg before biggest meal of the day. Monitor BG closely. - glimepiride (AMARYL) 2 MG tablet; Take 1 tab before biggest meal of the day  Dispense: 30 tablet; Refill: 2  5. Vascular dementia without behavioral disturbance (HCC) - donepezil (ARICEPT) 5 MG tablet; TAKE 1 TABLET BY MOUTH ONCE DAILY FOR MEMORY  Dispense: 90 tablet; Refill: 2  6. Elevated blood pressure reading Systolic BP remains elevated with low diastolic will need to continue to monitor and reevaluate in 2  weeks.  7. Chronic diastolic congestive heart failure ([place]) Echo in July 2021 showed Normal LVEF, moderate pulmonary artery HTN, Moderate AR, aortic and mitral valve stenosis and mild MR and TR. Indeterminate diastolic function. Will need to continue to monitor. Will need overnight pox   8. Stage 3b chronic kidney disease (HCC) Will hold lasix for 3 days due to worsening renal function. May resume lasix after 3 days. Rechecked CMP which shows some improvement in renal function from 2 weeks ago. Will need to continue to monitor.  9. Mixed hyperlipidemia - atorvastatin (LIPITOR) 10 MG tablet; Take 1 tablet (10 mg total) by mouth every evening.  Dispense: 90 tablet; Refill: 1  10. Other fatigue - CBC With Differential - Comprehensive metabolic panel - Fe+TIBC+Fer  General Counseling: Analeia verbalizes understanding of the findings of todays visit and agrees with plan of treatment. I have discussed any further diagnostic evaluation that may be needed or ordered today. We also reviewed her medications today. she has been encouraged to call the office with any questions or concerns that should arise related to todays visit.    Orders Placed This Encounter  Procedures  . CBC With Differential  . Comprehensive metabolic panel  . Fe+TIBC+Fer  . POCT INR    Meds ordered this encounter  Medications  . digoxin (LANOXIN) 0.125 MG tablet    Sig: Take 1 tablet (0.125 mg total) by mouth daily.    Dispense:  90 tablet    Refill:  1  . donepezil (ARICEPT) 5 MG tablet    Sig: TAKE 1 TABLET BY MOUTH ONCE DAILY FOR MEMORY    Dispense:  90 tablet    Refill:  2  . atorvastatin (LIPITOR) 10 MG tablet    Sig: Take 1 tablet (10 mg total) by mouth every evening.    Dispense:  90 tablet    Refill:  1  . glimepiride (AMARYL) 2 MG tablet    Sig: Take 1 tab before biggest meal of the day    Dispense:  30 tablet    Refill:  2  . warfarin (COUMADIN) 2 MG tablet    Sig: Take 1 tablet (2 mg total) by mouth  daily.    Dispense:  30 tablet    Refill:  3    Total time spent:45 Minutes Time spent includes review of chart, medications, test results, and follow up plan with the patient.  Pt has complex medical problems which involved critical thinking and high level decision making. Daughter is present in the room, might need home health and other resources   Dr Lavera Guise Internal medicine

## 2020-12-07 ENCOUNTER — Other Ambulatory Visit: Payer: Self-pay

## 2020-12-07 ENCOUNTER — Ambulatory Visit: Payer: Self-pay | Admitting: Internal Medicine

## 2020-12-07 ENCOUNTER — Telehealth: Payer: Self-pay

## 2020-12-07 LAB — CBC WITH DIFFERENTIAL
Basophils Absolute: 0 10*3/uL (ref 0.0–0.2)
Basos: 0 %
EOS (ABSOLUTE): 0.2 10*3/uL (ref 0.0–0.4)
Eos: 2 %
Hematocrit: 30.4 % — ABNORMAL LOW (ref 34.0–46.6)
Hemoglobin: 9.5 g/dL — ABNORMAL LOW (ref 11.1–15.9)
Immature Grans (Abs): 0.1 10*3/uL (ref 0.0–0.1)
Immature Granulocytes: 1 %
Lymphocytes Absolute: 2.8 10*3/uL (ref 0.7–3.1)
Lymphs: 27 %
MCH: 24.6 pg — ABNORMAL LOW (ref 26.6–33.0)
MCHC: 31.3 g/dL — ABNORMAL LOW (ref 31.5–35.7)
MCV: 79 fL (ref 79–97)
Monocytes Absolute: 0.5 10*3/uL (ref 0.1–0.9)
Monocytes: 4 %
Neutrophils Absolute: 7 10*3/uL (ref 1.4–7.0)
Neutrophils: 66 %
RBC: 3.86 x10E6/uL (ref 3.77–5.28)
RDW: 19 % — ABNORMAL HIGH (ref 11.7–15.4)
WBC: 10.6 10*3/uL (ref 3.4–10.8)

## 2020-12-07 LAB — COMPREHENSIVE METABOLIC PANEL
ALT: 8 IU/L (ref 0–32)
AST: 15 IU/L (ref 0–40)
Albumin/Globulin Ratio: 0.7 — ABNORMAL LOW (ref 1.2–2.2)
Albumin: 2.8 g/dL — ABNORMAL LOW (ref 3.7–4.7)
Alkaline Phosphatase: 99 IU/L (ref 44–121)
BUN/Creatinine Ratio: 15 (ref 12–28)
BUN: 32 mg/dL — ABNORMAL HIGH (ref 8–27)
Bilirubin Total: 0.2 mg/dL (ref 0.0–1.2)
CO2: 19 mmol/L — ABNORMAL LOW (ref 20–29)
Calcium: 8.4 mg/dL — ABNORMAL LOW (ref 8.7–10.3)
Chloride: 102 mmol/L (ref 96–106)
Creatinine, Ser: 2.18 mg/dL — ABNORMAL HIGH (ref 0.57–1.00)
GFR calc Af Amer: 24 mL/min/{1.73_m2} — ABNORMAL LOW (ref >59)
GFR calc non Af Amer: 21 mL/min/{1.73_m2} — ABNORMAL LOW (ref >59)
Globulin, Total: 3.8 g/dL (ref 1.5–4.5)
Glucose: 112 mg/dL — ABNORMAL HIGH (ref 65–99)
Potassium: 4.2 mmol/L (ref 3.5–5.2)
Sodium: 134 mmol/L (ref 134–144)
Total Protein: 6.6 g/dL (ref 6.0–8.5)

## 2020-12-07 LAB — IRON,TIBC AND FERRITIN PANEL
Ferritin: 26 ng/mL (ref 15–150)
Iron Saturation: 6 % — CL (ref 15–55)
Iron: 18 ug/dL — ABNORMAL LOW (ref 27–139)
Total Iron Binding Capacity: 292 ug/dL (ref 250–450)
UIBC: 274 ug/dL (ref 118–369)

## 2020-12-07 NOTE — Telephone Encounter (Signed)
Pt daughter called that pt glucose was 57 in morning as per dr Humphrey Rolls advised she need decreased toujeo to 10 unit with supper and also glimepiride 2 mg take 1/2 tab po in the morning and keep track for glucose  And also stopped victoza

## 2020-12-10 ENCOUNTER — Ambulatory Visit (INDEPENDENT_AMBULATORY_CARE_PROVIDER_SITE_OTHER): Payer: Medicare HMO

## 2020-12-10 DIAGNOSIS — Z7901 Long term (current) use of anticoagulants: Secondary | ICD-10-CM

## 2020-12-10 NOTE — Progress Notes (Signed)
Pt INR 2.6 continue same med as per lauren

## 2020-12-13 ENCOUNTER — Telehealth: Payer: Self-pay

## 2020-12-13 NOTE — Telephone Encounter (Signed)
-----   Message from Mylinda Latina, PA-C sent at 12/12/2020 12:17 PM EST ----- Please call the pt and let her know that her iron is low and that she should take daily OTC iron and vitamin C supplement. Thanks!

## 2020-12-20 ENCOUNTER — Encounter: Payer: Self-pay | Admitting: Hospice and Palliative Medicine

## 2020-12-20 ENCOUNTER — Other Ambulatory Visit: Payer: Self-pay

## 2020-12-20 ENCOUNTER — Ambulatory Visit (INDEPENDENT_AMBULATORY_CARE_PROVIDER_SITE_OTHER): Payer: Medicare HMO | Admitting: Internal Medicine

## 2020-12-20 VITALS — BP 140/63 | HR 85 | Temp 97.6°F | Resp 16 | Ht <= 58 in | Wt 130.0 lb

## 2020-12-20 DIAGNOSIS — E11649 Type 2 diabetes mellitus with hypoglycemia without coma: Secondary | ICD-10-CM | POA: Diagnosis not present

## 2020-12-20 DIAGNOSIS — Z7901 Long term (current) use of anticoagulants: Secondary | ICD-10-CM | POA: Diagnosis not present

## 2020-12-20 DIAGNOSIS — D509 Iron deficiency anemia, unspecified: Secondary | ICD-10-CM | POA: Diagnosis not present

## 2020-12-20 DIAGNOSIS — I4891 Unspecified atrial fibrillation: Secondary | ICD-10-CM | POA: Diagnosis not present

## 2020-12-20 DIAGNOSIS — N1832 Chronic kidney disease, stage 3b: Secondary | ICD-10-CM

## 2020-12-20 MED ORDER — DROPLET PEN NEEDLES 32G X 4 MM MISC: 1 refills | Status: DC

## 2020-12-20 MED ORDER — TOUJEO MAX SOLOSTAR 300 UNIT/ML ~~LOC~~ SOPN: PEN_INJECTOR | SUBCUTANEOUS | 3 refills | Status: DC

## 2020-12-20 NOTE — Progress Notes (Signed)
[facility] [place], [place] [address]  Internal MEDICINE  Office Visit Note  Patient Name: [name]  [id number]  [id number]  Date of Service: 12/21/2020  Chief Complaint  Patient presents with  . Follow-up    Sugar is low   . Diabetes  . Gastroesophageal Reflux  . Hyperlipidemia  . Hypertension    HPI Pt is here for f/u after med changes, her victoza was stopped. Am hypoglycemia is still present.  She is on Lantus and Amaryl.  s/p blood transfusion with her daughter who manages her medication. Last visit she appeared pale, SOB and fatigued. A stat CBC showed low hemoglobin of 5.9. She was sent to same day surgery center and received 2 units of blood and her hemoglobin improved to 8.4 the next day. She states she is feeling better since getting the transfusion, but is not 100%. Per record her hemoglobin has been low since August. She does mention that she has been eating more ice lately. -She has a hx CHF with diastolic dysfunction, and OSa with hypoxia but is not on therapy currently. She also has a hx of afib for which she takes coumadin.  She has been on coumadin 2mg  daily. -She has hx of Right nephrectomy and now has CKD, with poorly controled DM.  Current Medication: Outpatient Encounter Medications as of 12/20/2020  Medication Sig  . ACCU-CHEK FASTCLIX LANCETS MISC   . acetaminophen (TYLENOL) 500 MG tablet Take by mouth.  Marland Kitchen acetaminophen-codeine (TYLENOL #3) 300-30 MG tablet Take 1 tablet by mouth every 6 (six) hours as needed for moderate pain.  . Alcohol Swabs (B-D SINGLE USE SWABS REGULAR) PADS USE AS DIRECTED TWICE DAILY  . atorvastatin (LIPITOR) 10 MG tablet Take 1 tablet (10 mg total) by mouth every evening.  . bisacodyl (DULCOLAX) 5 MG EC tablet Take 1 tablet (5 mg total) by mouth daily as needed for moderate constipation.  . citalopram (CELEXA) 40 MG tablet Take 1 tablet (40 mg total) by mouth daily.  . digoxin (LANOXIN) 0.125 MG tablet  Take 1 tablet (0.125 mg total) by mouth daily.  Mariane Baumgarten Sodium (DSS) 100 MG CAPS Take by mouth.  . donepezil (ARICEPT) 5 MG tablet TAKE 1 TABLET BY MOUTH ONCE DAILY FOR MEMORY  . ergocalciferol (VITAMIN D2) 1.25 MG (50000 UT) capsule Take by mouth.  . fluticasone (FLONASE) 50 MCG/ACT nasal spray Place 2 sprays into both nostrils daily.  . furosemide (LASIX) 20 MG tablet Take 1 tablet (20 mg total) by mouth daily.  Marland Kitchen glimepiride (AMARYL) 2 MG tablet Take 1 tab before biggest meal of the day (Patient taking differently: Take 1/2 tab before biggest meal of the day)  . glucose blood test strip Use as instructed to check blood sugars twice a day.  E11.65  . glucose blood test strip   . insulin glargine, 2 Unit Dial, (TOUJEO MAX SOLOSTAR) 300 UNIT/ML Solostar Pen Take 6 to 10 units with supper( needs needles as well e 11.65  . pantoprazole (PROTONIX) 40 MG tablet Take 1 tablet (40 mg total) by mouth 2 (two) times daily.  Marland Kitchen rOPINIRole (REQUIP) 0.5 MG tablet Take 1 tablet (0.5 mg total) by mouth every evening.  . traMADol (ULTRAM) 50 MG tablet Take 1 tablet (50 mg total) by mouth 2 (two) times daily as needed. for pain  . warfarin (COUMADIN) 2 MG tablet Take 1 tablet (2 mg total) by mouth daily.  . [DISCONTINUED] DROPLET PEN NEEDLES 32G X 4 MM  MISC USE AS DIRECTED  . [DISCONTINUED] insulin glargine, 2 Unit Dial, (TOUJEO MAX SOLOSTAR) 300 UNIT/ML Solostar Pen Inject 14 Units into the skin daily. (Patient taking differently: Inject 10 Units into the skin daily.)   No facility-administered encounter medications on file as of 12/20/2020.    Surgical History: Past Surgical History:  Procedure Laterality Date  . ABDOMINAL HYSTERECTOMY    . APPENDECTOMY    . CATARACT EXTRACTION    . CHOLECYSTECTOMY    . HERNIA REPAIR    . right knee replacement    . right nephroectomy      Medical History: Past Medical History:  Diagnosis Date  . Anemia   . Atrial fibrillation ([place])   . Congestive heart  failure (CHF) ([place])   . Diabetes mellitus without complication ([place])   . GERD (gastroesophageal reflux disease)   . Hyperlipidemia   . Hypertension     Family History: Family History  Problem Relation Age of Onset  . Breast cancer Mother     Social History   Socioeconomic History  . Marital status: Widowed    Spouse name: Not on file  . Number of children: Not on file  . Years of education: Not on file  . Highest education level: Not on file  Occupational History  . Not on file  Tobacco Use  . Smoking status: Current Every Day Smoker    Packs/day: 1.00    Types: Cigarettes    Last attempt to quit: 01/03/2019    Years since quitting: 1.9  . Smokeless tobacco: Never Used  Vaping Use  . Vaping Use: Never used  Substance and Sexual Activity  . Alcohol use: No  . Drug use: No  . Sexual activity: Not on file  Other Topics Concern  . Not on file  Social History Narrative  . Not on file   Social Determinants of Health   Financial Resource Strain: Not on file  Food Insecurity: Not on file  Transportation Needs: Not on file  Physical Activity: Not on file  Stress: Not on file  Social Connections: Not on file  Intimate Partner Violence: Not on file      Review of Systems  Constitutional: Positive for fatigue. Negative for chills and diaphoresis.  HENT: Negative for ear pain, postnasal drip and sinus pressure.   Eyes: Negative for photophobia, discharge, redness, itching and visual disturbance.  Respiratory: Negative for cough, shortness of breath and wheezing.   Cardiovascular: Negative for chest pain, palpitations and leg swelling.  Gastrointestinal: Negative for abdominal pain, constipation, diarrhea, nausea and vomiting.  Genitourinary: Negative for dysuria and flank pain.  Musculoskeletal: Negative for arthralgias, back pain, gait problem and neck pain.  Skin: Negative for color change.  Allergic/Immunologic: Negative for environmental allergies and food  allergies.  Neurological: Negative for dizziness and headaches.  Hematological: Does not bruise/bleed easily.  Psychiatric/Behavioral: Negative for agitation, behavioral problems (depression) and hallucinations.    Vital Signs: BP 140/63   Pulse 85   Temp 97.6 F (36.4 C)   Resp 16   Ht 4\' 10"  (1.473 m)   Wt 130 lb (59 kg)   SpO2 96%   BMI 27.17 kg/m    Physical Exam Constitutional:      General: She is not in acute distress.    Appearance: She is well-developed. She is not diaphoretic.  HENT:     Head: Normocephalic and atraumatic.     Mouth/Throat:     Pharynx: No oropharyngeal exudate.  Eyes:  Pupils: Pupils are equal, round, and reactive to light.  Neck:     Thyroid: No thyromegaly.     Vascular: No JVD.     Trachea: No tracheal deviation.  Cardiovascular:     Rate and Rhythm: Normal rate and regular rhythm.     Heart sounds: Normal heart sounds. No murmur heard. No friction rub. No gallop.   Pulmonary:     Effort: Pulmonary effort is normal. No respiratory distress.     Breath sounds: No wheezing or rales.  Chest:     Chest wall: No tenderness.  Abdominal:     General: Bowel sounds are normal.     Palpations: Abdomen is soft.  Musculoskeletal:        General: Normal range of motion.     Cervical back: Normal range of motion and neck supple.  Lymphadenopathy:     Cervical: No cervical adenopathy.  Skin:    General: Skin is warm and dry.  Neurological:     Mental Status: She is alert and oriented to person, place, and time.     Cranial Nerves: No cranial nerve deficit.  Psychiatric:        Behavior: Behavior normal.        Thought Content: Thought content normal.        Judgment: Judgment normal.        Assessment/Plan: 1. Uncontrolled type 2 diabetes mellitus with hypoglycemia without coma ([place]) Pt has Am hypoglycemia, will reduce Toujeo to 6 units and might have to stop eventually., decreased Amaryl to 1 mg bid as well on previous visit -  POCT Glucose (CBG)  2. Iron deficiency anemia, unspecified iron deficiency anemia type Will continue to monitor, Check CBC and iron studies   3. Atrial fibrillation, unspecified type ([place]) Heart rate is well controlled, on Lanoxin   4. Stage 3b chronic kidney disease (HCC) Monitor renal functions, improved, reduced lasix to 20 mg qd, has not seen nephrology for a while   5. Long term (current) use of anticoagulants Continue Coumadin as before  General Counseling: Nieve verbalizes understanding of the findings of todays visit and agrees with plan of treatment. I have discussed any further diagnostic evaluation that may be needed or ordered today. We also reviewed her medications today. she has been encouraged to call the office with any questions or concerns that should arise related to todays visit.    Orders Placed This Encounter  Procedures  . POCT Glucose (CBG)    Meds ordered this encounter  Medications  . insulin glargine, 2 Unit Dial, (TOUJEO MAX SOLOSTAR) 300 UNIT/ML Solostar Pen    Sig: Take 6 to 10 units with supper( needs needles as well e 11.65    Dispense:  6 mL    Refill:  3    Total time spent: 30 Minutes Time spent includes review of chart, medications, test results, and follow up plan with the patient.    Controlled Substance Database was reviewed by me.   Dr Lavera Guise Internal medicine

## 2020-12-21 ENCOUNTER — Ambulatory Visit (INDEPENDENT_AMBULATORY_CARE_PROVIDER_SITE_OTHER): Payer: Medicare HMO

## 2020-12-21 DIAGNOSIS — Z7901 Long term (current) use of anticoagulants: Secondary | ICD-10-CM | POA: Diagnosis not present

## 2020-12-21 NOTE — Progress Notes (Signed)
Pt INR 1.5 as per dr Humphrey Rolls continue same med

## 2020-12-23 ENCOUNTER — Inpatient Hospital Stay
Admission: EM | Admit: 2020-12-23 | Discharge: 2020-12-29 | DRG: 305 | Disposition: A | Discharge status: Skilled Nursing Facility | Payer: Medicare HMO | Attending: Student | Admitting: Student

## 2020-12-23 ENCOUNTER — Telehealth: Payer: Self-pay

## 2020-12-23 ENCOUNTER — Emergency Department: Payer: Medicare HMO

## 2020-12-23 ENCOUNTER — Other Ambulatory Visit: Payer: Self-pay

## 2020-12-23 ENCOUNTER — Observation Stay: Payer: Medicare HMO

## 2020-12-23 DIAGNOSIS — E1122 Type 2 diabetes mellitus with diabetic chronic kidney disease: Secondary | ICD-10-CM | POA: Diagnosis present

## 2020-12-23 DIAGNOSIS — J0130 Acute sphenoidal sinusitis, unspecified: Secondary | ICD-10-CM | POA: Diagnosis not present

## 2020-12-23 DIAGNOSIS — Z20822 Contact with and (suspected) exposure to covid-19: Secondary | ICD-10-CM | POA: Diagnosis present

## 2020-12-23 DIAGNOSIS — M50221 Other cervical disc displacement at C4-C5 level: Secondary | ICD-10-CM | POA: Diagnosis not present

## 2020-12-23 DIAGNOSIS — K219 Gastro-esophageal reflux disease without esophagitis: Secondary | ICD-10-CM | POA: Diagnosis not present

## 2020-12-23 DIAGNOSIS — F0390 Unspecified dementia without behavioral disturbance: Secondary | ICD-10-CM | POA: Diagnosis present

## 2020-12-23 DIAGNOSIS — Z7901 Long term (current) use of anticoagulants: Secondary | ICD-10-CM | POA: Diagnosis not present

## 2020-12-23 DIAGNOSIS — I5042 Chronic combined systolic (congestive) and diastolic (congestive) heart failure: Secondary | ICD-10-CM | POA: Diagnosis not present

## 2020-12-23 DIAGNOSIS — I160 Hypertensive urgency: Principal | ICD-10-CM | POA: Diagnosis present

## 2020-12-23 DIAGNOSIS — E785 Hyperlipidemia, unspecified: Secondary | ICD-10-CM | POA: Diagnosis present

## 2020-12-23 DIAGNOSIS — D509 Iron deficiency anemia, unspecified: Secondary | ICD-10-CM | POA: Diagnosis present

## 2020-12-23 DIAGNOSIS — F17200 Nicotine dependence, unspecified, uncomplicated: Secondary | ICD-10-CM | POA: Diagnosis present

## 2020-12-23 DIAGNOSIS — J9811 Atelectasis: Secondary | ICD-10-CM | POA: Diagnosis not present

## 2020-12-23 DIAGNOSIS — Z66 Do not resuscitate: Secondary | ICD-10-CM | POA: Diagnosis present

## 2020-12-23 DIAGNOSIS — D631 Anemia in chronic kidney disease: Secondary | ICD-10-CM | POA: Diagnosis present

## 2020-12-23 DIAGNOSIS — G2581 Restless legs syndrome: Secondary | ICD-10-CM | POA: Diagnosis present

## 2020-12-23 DIAGNOSIS — Z862 Personal history of diseases of the blood and blood-forming organs and certain disorders involving the immune mechanism: Secondary | ICD-10-CM

## 2020-12-23 DIAGNOSIS — I5043 Acute on chronic combined systolic (congestive) and diastolic (congestive) heart failure: Secondary | ICD-10-CM | POA: Diagnosis present

## 2020-12-23 DIAGNOSIS — R531 Weakness: Secondary | ICD-10-CM | POA: Diagnosis not present

## 2020-12-23 DIAGNOSIS — N184 Chronic kidney disease, stage 4 (severe): Secondary | ICD-10-CM | POA: Diagnosis present

## 2020-12-23 DIAGNOSIS — N189 Chronic kidney disease, unspecified: Secondary | ICD-10-CM

## 2020-12-23 DIAGNOSIS — E538 Deficiency of other specified B group vitamins: Secondary | ICD-10-CM | POA: Diagnosis not present

## 2020-12-23 DIAGNOSIS — R791 Abnormal coagulation profile: Secondary | ICD-10-CM | POA: Diagnosis present

## 2020-12-23 DIAGNOSIS — I482 Chronic atrial fibrillation, unspecified: Secondary | ICD-10-CM | POA: Diagnosis not present

## 2020-12-23 DIAGNOSIS — Z6822 Body mass index (BMI) 22.0-22.9, adult: Secondary | ICD-10-CM

## 2020-12-23 DIAGNOSIS — Z794 Long term (current) use of insulin: Secondary | ICD-10-CM | POA: Diagnosis not present

## 2020-12-23 DIAGNOSIS — I10 Essential (primary) hypertension: Secondary | ICD-10-CM

## 2020-12-23 DIAGNOSIS — F17210 Nicotine dependence, cigarettes, uncomplicated: Secondary | ICD-10-CM | POA: Diagnosis present

## 2020-12-23 DIAGNOSIS — I130 Hypertensive heart and chronic kidney disease with heart failure and stage 1 through stage 4 chronic kidney disease, or unspecified chronic kidney disease: Secondary | ICD-10-CM | POA: Diagnosis not present

## 2020-12-23 DIAGNOSIS — J323 Chronic sphenoidal sinusitis: Secondary | ICD-10-CM | POA: Diagnosis not present

## 2020-12-23 DIAGNOSIS — R29818 Other symptoms and signs involving the nervous system: Secondary | ICD-10-CM | POA: Diagnosis not present

## 2020-12-23 DIAGNOSIS — I4891 Unspecified atrial fibrillation: Secondary | ICD-10-CM | POA: Diagnosis present

## 2020-12-23 DIAGNOSIS — R251 Tremor, unspecified: Secondary | ICD-10-CM | POA: Diagnosis present

## 2020-12-23 DIAGNOSIS — F339 Major depressive disorder, recurrent, unspecified: Secondary | ICD-10-CM | POA: Diagnosis not present

## 2020-12-23 DIAGNOSIS — M6281 Muscle weakness (generalized): Secondary | ICD-10-CM | POA: Diagnosis not present

## 2020-12-23 DIAGNOSIS — M4802 Spinal stenosis, cervical region: Secondary | ICD-10-CM | POA: Diagnosis not present

## 2020-12-23 DIAGNOSIS — I350 Nonrheumatic aortic (valve) stenosis: Secondary | ICD-10-CM | POA: Diagnosis not present

## 2020-12-23 DIAGNOSIS — G319 Degenerative disease of nervous system, unspecified: Secondary | ICD-10-CM | POA: Diagnosis not present

## 2020-12-23 DIAGNOSIS — M50222 Other cervical disc displacement at C5-C6 level: Secondary | ICD-10-CM | POA: Diagnosis not present

## 2020-12-23 DIAGNOSIS — R279 Unspecified lack of coordination: Secondary | ICD-10-CM | POA: Diagnosis not present

## 2020-12-23 DIAGNOSIS — R54 Age-related physical debility: Secondary | ICD-10-CM | POA: Diagnosis not present

## 2020-12-23 DIAGNOSIS — I421 Obstructive hypertrophic cardiomyopathy: Secondary | ICD-10-CM | POA: Diagnosis not present

## 2020-12-23 DIAGNOSIS — M545 Low back pain, unspecified: Secondary | ICD-10-CM | POA: Diagnosis not present

## 2020-12-23 DIAGNOSIS — Z96651 Presence of right artificial knee joint: Secondary | ICD-10-CM | POA: Diagnosis present

## 2020-12-23 DIAGNOSIS — Z803 Family history of malignant neoplasm of breast: Secondary | ICD-10-CM

## 2020-12-23 DIAGNOSIS — I6782 Cerebral ischemia: Secondary | ICD-10-CM | POA: Diagnosis not present

## 2020-12-23 DIAGNOSIS — R5381 Other malaise: Secondary | ICD-10-CM | POA: Diagnosis not present

## 2020-12-23 DIAGNOSIS — I351 Nonrheumatic aortic (valve) insufficiency: Secondary | ICD-10-CM | POA: Diagnosis not present

## 2020-12-23 LAB — URINALYSIS, COMPLETE (UACMP) WITH MICROSCOPIC
Bilirubin Urine: NEGATIVE
Glucose, UA: 150 mg/dL — AB
Ketones, ur: NEGATIVE mg/dL
Leukocytes,Ua: NEGATIVE
Nitrite: NEGATIVE
Protein, ur: >=300 mg/dL — AB
Specific Gravity, Urine: 1.013 (ref 1.005–1.030)
pH: 6 (ref 5.0–8.0)

## 2020-12-23 LAB — CBC
HCT: 28.8 % — ABNORMAL LOW (ref 36.0–46.0)
Hemoglobin: 8.7 g/dL — ABNORMAL LOW (ref 12.0–15.0)
MCH: 24.4 pg — ABNORMAL LOW (ref 26.0–34.0)
MCHC: 30.2 g/dL (ref 30.0–36.0)
MCV: 80.9 fL (ref 80.0–100.0)
Platelets: 221 10*3/uL (ref 150–400)
RBC: 3.56 MIL/uL — ABNORMAL LOW (ref 3.87–5.11)
RDW: 21.9 % — ABNORMAL HIGH (ref 11.5–15.5)
WBC: 6.8 10*3/uL (ref 4.0–10.5)
nRBC: 0 % (ref 0.0–0.2)

## 2020-12-23 LAB — CBG MONITORING, ED
Glucose-Capillary: 99 mg/dL (ref 70–99)
Glucose-Capillary: 106 mg/dL — ABNORMAL HIGH (ref 70–99)

## 2020-12-23 LAB — BASIC METABOLIC PANEL
Anion gap: 7 (ref 5–15)
BUN: 17 mg/dL (ref 8–23)
CO2: 22 mmol/L (ref 22–32)
Calcium: 8.3 mg/dL — ABNORMAL LOW (ref 8.9–10.3)
Chloride: 108 mmol/L (ref 98–111)
Creatinine, Ser: 2.02 mg/dL — ABNORMAL HIGH (ref 0.44–1.00)
GFR, Estimated: 25 mL/min — ABNORMAL LOW (ref >60)
Glucose, Bld: 187 mg/dL — ABNORMAL HIGH (ref 70–99)
Potassium: 4.1 mmol/L (ref 3.5–5.1)
Sodium: 137 mmol/L (ref 135–145)

## 2020-12-23 LAB — RESP PANEL BY RT-PCR (FLU A&B, COVID) ARPGX2
Influenza A by PCR: NEGATIVE
Influenza B by PCR: NEGATIVE
SARS Coronavirus 2 by RT PCR: NEGATIVE

## 2020-12-23 LAB — PROTIME-INR
INR: 1.4 — ABNORMAL HIGH (ref 0.8–1.2)
Prothrombin Time: 16.8 seconds — ABNORMAL HIGH (ref 11.4–15.2)

## 2020-12-23 LAB — TROPONIN I (HIGH SENSITIVITY)
Troponin I (High Sensitivity): 18 ng/L — ABNORMAL HIGH (ref <18)
Troponin I (High Sensitivity): 18 ng/L — ABNORMAL HIGH (ref <18)
Troponin I (High Sensitivity): 19 ng/L — ABNORMAL HIGH (ref <18)
Troponin I (High Sensitivity): 18 ng/L — ABNORMAL HIGH (ref <18)

## 2020-12-23 LAB — GLUCOSE, CAPILLARY: Glucose-Capillary: 106 mg/dL — ABNORMAL HIGH (ref 70–99)

## 2020-12-23 LAB — DIGOXIN LEVEL: Digoxin Level: 1.6 ng/mL (ref 0.8–2.0)

## 2020-12-23 MED ORDER — PANTOPRAZOLE SODIUM 40 MG PO TBEC
40.0000 mg | DELAYED_RELEASE_TABLET | Freq: Two times a day (BID) | ORAL | Status: DC
  Administered 2020-12-23 – 2020-12-29 (×12): 40 mg via ORAL
  Filled 2020-12-23 (×12): qty 1

## 2020-12-23 MED ORDER — ACETAMINOPHEN 160 MG/5ML PO SOLN
650.0000 mg | ORAL | Status: DC | PRN
  Filled 2020-12-23: qty 20.3

## 2020-12-23 MED ORDER — ACETAMINOPHEN 650 MG RE SUPP: 650.0000 mg | RECTAL | Status: DC | PRN

## 2020-12-23 MED ORDER — STROKE: EARLY STAGES OF RECOVERY BOOK: Freq: Once | Status: DC

## 2020-12-23 MED ORDER — CITALOPRAM HYDROBROMIDE 20 MG PO TABS
40.0000 mg | ORAL_TABLET | Freq: Every day | ORAL | Status: DC
  Administered 2020-12-24 – 2020-12-29 (×6): 40 mg via ORAL
  Filled 2020-12-23 (×6): qty 2

## 2020-12-23 MED ORDER — WARFARIN SODIUM 2 MG PO TABS: 2.0000 mg | ORAL_TABLET | Freq: Every day | ORAL | Status: DC

## 2020-12-23 MED ORDER — AMLODIPINE BESYLATE 5 MG PO TABS
5.0000 mg | ORAL_TABLET | Freq: Once | ORAL | Status: AC
  Administered 2020-12-23: 5 mg via ORAL
  Filled 2020-12-23: qty 1

## 2020-12-23 MED ORDER — DIGOXIN 125 MCG PO TABS
0.1250 mg | ORAL_TABLET | Freq: Every day | ORAL | Status: DC
  Administered 2020-12-24 – 2020-12-29 (×6): 0.125 mg via ORAL
  Filled 2020-12-23 (×6): qty 1

## 2020-12-23 MED ORDER — DONEPEZIL HCL 5 MG PO TABS: 5.0000 mg | ORAL_TABLET | Freq: Every day | ORAL | Status: DC

## 2020-12-23 MED ORDER — FERROUS SULFATE 325 (65 FE) MG PO TABS
325.0000 mg | ORAL_TABLET | Freq: Every day | ORAL | Status: DC
  Administered 2020-12-24 – 2020-12-29 (×6): 325 mg via ORAL
  Filled 2020-12-23 (×6): qty 1

## 2020-12-23 MED ORDER — SODIUM CHLORIDE 0.9 % IV BOLUS
500.0000 mL | Freq: Once | INTRAVENOUS | Status: AC
  Administered 2020-12-23: 500 mL via INTRAVENOUS

## 2020-12-23 MED ORDER — WARFARIN SODIUM 3 MG PO TABS
3.0000 mg | ORAL_TABLET | Freq: Once | ORAL | Status: AC
  Administered 2020-12-23: 3 mg via ORAL
  Filled 2020-12-23: qty 1

## 2020-12-23 MED ORDER — IPRATROPIUM-ALBUTEROL 0.5-2.5 (3) MG/3ML IN SOLN
3.0000 mL | Freq: Once | RESPIRATORY_TRACT | Status: AC
  Administered 2020-12-23: 3 mL via RESPIRATORY_TRACT
  Filled 2020-12-23: qty 3

## 2020-12-23 MED ORDER — DONEPEZIL HCL 5 MG PO TABS
10.0000 mg | ORAL_TABLET | Freq: Every day | ORAL | Status: DC
  Administered 2020-12-24 – 2020-12-28 (×5): 10 mg via ORAL
  Filled 2020-12-23 (×6): qty 2

## 2020-12-23 MED ORDER — SODIUM CHLORIDE 0.9 % IV SOLN: INTRAVENOUS | Status: DC

## 2020-12-23 MED ORDER — ROPINIROLE HCL 0.25 MG PO TABS
0.5000 mg | ORAL_TABLET | Freq: Every evening | ORAL | Status: DC
  Administered 2020-12-23 – 2020-12-28 (×6): 0.5 mg via ORAL
  Filled 2020-12-23 (×6): qty 1

## 2020-12-23 MED ORDER — ATORVASTATIN CALCIUM 10 MG PO TABS
10.0000 mg | ORAL_TABLET | Freq: Every evening | ORAL | Status: DC
  Administered 2020-12-23 – 2020-12-28 (×6): 10 mg via ORAL
  Filled 2020-12-23 (×6): qty 1

## 2020-12-23 MED ORDER — WARFARIN - PHARMACIST DOSING INPATIENT
Freq: Every day | Status: DC
  Filled 2020-12-23: qty 1

## 2020-12-23 MED ORDER — INSULIN ASPART 100 UNIT/ML ~~LOC~~ SOLN: 0.0000 [IU] | SUBCUTANEOUS | Status: DC

## 2020-12-23 MED ORDER — ACETAMINOPHEN 325 MG PO TABS
650.0000 mg | ORAL_TABLET | ORAL | Status: DC | PRN
  Administered 2020-12-23 – 2020-12-28 (×5): 650 mg via ORAL
  Filled 2020-12-23 (×5): qty 2

## 2020-12-23 MED ORDER — LABETALOL HCL 5 MG/ML IV SOLN: 10.0000 mg | Freq: Four times a day (QID) | INTRAVENOUS | Status: DC | PRN

## 2020-12-23 NOTE — ED Provider Notes (Signed)
[facility] Emergency Department Provider Note    Event Date/Time   First MD Initiated Contact with Patient 12/23/20 1114     (approximate)  I have reviewed the triage vital signs and the nursing notes.   HISTORY  Chief Complaint Weakness    HPI [name] is a 80 y.o. female stress past medical history as listed below presents to the ER for evaluation of  generalized weakness.  States been going on for a day or two.  Also has some more pronounced left-sided weakness.  States today she was feeling jittery and unable to stand due to weakness denies any chest pain.  No shortness of breath.  No bloody bowel movements no melena.  No abdominal pain.  No fevers   Past Medical History:  Diagnosis Date  . Anemia   . Atrial fibrillation ([place])   . Congestive heart failure (CHF) ([place])   . Diabetes mellitus without complication ([place])   . GERD (gastroesophageal reflux disease)   . Hyperlipidemia   . Hypertension    Family History  Problem Relation Age of Onset  . Breast cancer Mother    Past Surgical History:  Procedure Laterality Date  . ABDOMINAL HYSTERECTOMY    . APPENDECTOMY    . CATARACT EXTRACTION    . CHOLECYSTECTOMY    . HERNIA REPAIR    . right knee replacement    . right nephroectomy     Patient Active Problem List   Diagnosis Date Noted  . Hypertensive urgency 12/23/2020  . Nicotine dependence 12/23/2020  . History of anemia due to chronic kidney disease 12/23/2020  . Pain due to onychomycosis of toenails of both feet 02/12/2020  . Uncontrolled type 2 diabetes mellitus with hyperglycemia ([place]) 05/20/2019  . Acute recurrent pansinusitis 05/11/2019  . Acute otitis media 05/11/2019  . Acute pain of left shoulder 05/11/2019  . Long term (current) use of anticoagulants 04/03/2019  . Chronic left shoulder pain 03/18/2019  . Sepsis ([place]) 01/12/2018  . Colitis 01/12/2018  . CKD (chronic kidney disease), stage III ([place]) 01/12/2018  .  Diabetes ([place]) 12/28/2017  . AF (paroxysmal atrial fibrillation) ([place]) 12/28/2017  . Encounter for general adult medical examination with abnormal findings 12/28/2017  . Primary generalized (osteo)arthritis 12/28/2017  . Hypertension 12/27/2017  . GERD (gastroesophageal reflux disease) 12/27/2017  . Hematuria 03/18/2015  . Chronic combined systolic and diastolic heart failure ([place]) 03/01/2012  . Obesity 03/01/2012  . CKD stage 4 due to type 2 diabetes mellitus ([place]) 03/01/2012  . Other benign neoplasm of connective and other soft tissue of lower limb, including hip 03/15/2011  . Neoplasm of connective tissue 02/01/2011  . Pain in joint, pelvic region and thigh 02/01/2011  . Type II diabetes mellitus ([place]) 10/03/2006  . Encounter for current long-term use of anticoagulants 02/07/2006  . Essential hypertension 09/24/2004  . Atrial fibrillation ([place]) 06/07/2004      Prior to Admission medications   Medication Sig Start Date End Date Taking? Authorizing Provider  atorvastatin (LIPITOR) 10 MG tablet Take 1 tablet (10 mg total) by mouth every evening. 12/06/20  Yes McDonough, Lauren K, PA-C  citalopram (CELEXA) 40 MG tablet Take 1 tablet (40 mg total) by mouth daily. 10/20/20  Yes Lavera Guise, MD  digoxin (LANOXIN) 0.125 MG tablet Take 1 tablet (0.125 mg total) by mouth daily. 12/06/20  Yes McDonough, Lauren K, PA-C  donepezil (ARICEPT) 5 MG tablet TAKE 1 TABLET BY MOUTH ONCE DAILY FOR MEMORY 12/06/20  Yes  McDonough, Lauren K, PA-C  insulin glargine, 2 Unit Dial, (TOUJEO MAX SOLOSTAR) 300 UNIT/ML Solostar Pen Take 6 to 10 units with supper( needs needles as well e 11.65 12/20/20  Yes Lavera Guise, MD  pantoprazole (PROTONIX) 40 MG tablet Take 1 tablet (40 mg total) by mouth 2 (two) times daily. 10/20/20  Yes Lavera Guise, MD  rOPINIRole (REQUIP) 0.5 MG tablet Take 1 tablet (0.5 mg total) by mouth every evening. 10/20/20  Yes Lavera Guise, MD  warfarin (COUMADIN) 2 MG tablet Take 1 tablet (2  mg total) by mouth daily. 12/06/20  Yes McDonough, Si Gaul, PA-C    Allergies Patient has no known allergies.    Social History Social History   Tobacco Use  . Smoking status: Current Every Day Smoker    Packs/day: 1.00    Types: Cigarettes    Last attempt to quit: 01/03/2019    Years since quitting: 1.9  . Smokeless tobacco: Never Used  Vaping Use  . Vaping Use: Never used  Substance Use Topics  . Alcohol use: No  . Drug use: No    Review of Systems Patient denies headaches, rhinorrhea, blurry vision, numbness, shortness of breath, chest pain, edema, cough, abdominal pain, nausea, vomiting, diarrhea, dysuria, fevers, rashes or hallucinations unless otherwise stated above in HPI. ____________________________________________   PHYSICAL EXAM:  VITAL SIGNS: Vitals:   12/23/20 1115 12/23/20 1300  BP: (!) 205/73 (!) 211/67  Pulse: 68 (!) 57  Resp: 20 18  Temp:    SpO2: 98% 96%    Constitutional: Alert and oriented.  Eyes: Conjunctivae are normal.  Head: Atraumatic. Nose: No congestion/rhinnorhea. Mouth/Throat: Mucous membranes are moist.   Neck: No stridor. Painless ROM.  Cardiovascular: Normal rate, regular rhythm. Grossly normal heart sounds.  Good peripheral circulation. Respiratory: Normal respiratory effort.  No retractions. Lungs CTAB. Gastrointestinal: Soft and nontender. No distention. No abdominal bruits. No CVA tenderness. Genitourinary:  Musculoskeletal: No lower extremity tenderness nor edema.  No joint effusions. Neurologic:  Normal speech and language. No gross focal neurologic deficits are appreciated. No facial droop, tace weakness of LLE as compared to right. Skin:  Skin is warm, dry and intact. No rash noted. Psychiatric: Mood and affect are normal. Speech and behavior are normal.  ____________________________________________   LABS (all labs ordered are listed, but only abnormal results are displayed)  Results for orders placed or performed  during the hospital encounter of 12/23/20 (from the past 24 hour(s))  Basic metabolic panel     Status: Abnormal   Collection Time: 12/23/20 10:40 AM  Result Value Ref Range   Sodium 137 135 - 145 mmol/L   Potassium 4.1 3.5 - 5.1 mmol/L   Chloride 108 98 - 111 mmol/L   CO2 22 22 - 32 mmol/L   Glucose, Bld 187 (H) 70 - 99 mg/dL   BUN 17 8 - 23 mg/dL   Creatinine, Ser 2.02 (H) 0.44 - 1.00 mg/dL   Calcium 8.3 (L) 8.9 - 10.3 mg/dL   GFR, Estimated 25 (L) >60 mL/min   Anion gap 7 5 - 15  CBC     Status: Abnormal   Collection Time: 12/23/20 10:40 AM  Result Value Ref Range   WBC 6.8 4.0 - 10.5 K/uL   RBC 3.56 (L) 3.87 - 5.11 MIL/uL   Hemoglobin 8.7 (L) 12.0 - 15.0 g/dL   HCT 28.8 (L) 36.0 - 46.0 %   MCV 80.9 80.0 - 100.0 fL   MCH 24.4 (L) 26.0 -  34.0 pg   MCHC 30.2 30.0 - 36.0 g/dL   RDW 21.9 (H) 11.5 - 15.5 %   Platelets 221 150 - 400 K/uL   nRBC 0.0 0.0 - 0.2 %  Urinalysis, Complete w Microscopic Urine, Clean Catch     Status: Abnormal   Collection Time: 12/23/20 10:40 AM  Result Value Ref Range   Color, Urine YELLOW (A) YELLOW   APPearance HAZY (A) CLEAR   Specific Gravity, Urine 1.013 1.005 - 1.030   pH 6.0 5.0 - 8.0   Glucose, UA 150 (A) NEGATIVE mg/dL   Hgb urine dipstick MODERATE (A) NEGATIVE   Bilirubin Urine NEGATIVE NEGATIVE   Ketones, ur NEGATIVE NEGATIVE mg/dL   Protein, ur >=300 (A) NEGATIVE mg/dL   Nitrite NEGATIVE NEGATIVE   Leukocytes,Ua NEGATIVE NEGATIVE   RBC / HPF 21-50 0 - 5 RBC/hpf   WBC, UA 0-5 0 - 5 WBC/hpf   Bacteria, UA RARE (A) NONE SEEN   Squamous Epithelial / LPF 6-10 0 - 5   Mucus PRESENT   Troponin I (High Sensitivity)     Status: Abnormal   Collection Time: 12/23/20 11:24 AM  Result Value Ref Range   Troponin I (High Sensitivity) 18 (H) <18 ng/L  Resp Panel by RT-PCR (Flu A&B, Covid) Nasopharyngeal Swab     Status: None   Collection Time: 12/23/20 12:52 PM   Specimen: Nasopharyngeal Swab; Nasopharyngeal(NP) swabs in vial transport medium   Result Value Ref Range   SARS Coronavirus 2 by RT PCR NEGATIVE NEGATIVE   Influenza A by PCR NEGATIVE NEGATIVE   Influenza B by PCR NEGATIVE NEGATIVE  Troponin I (High Sensitivity)     Status: Abnormal   Collection Time: 12/23/20 12:52 PM  Result Value Ref Range   Troponin I (High Sensitivity) 18 (H) <18 ng/L  Protime-INR     Status: Abnormal   Collection Time: 12/23/20  3:07 PM  Result Value Ref Range   Prothrombin Time 16.8 (H) 11.4 - 15.2 seconds   INR 1.4 (H) 0.8 - 1.2   ____________________________________________  EKG My review and personal interpretation at Time: 10:46   Indication: weakness  Rate: 70  Rhythm: afi Axis: normal Other: nonspecific st abn with inferior depression, elevations concerning but no stemi criteria - will repeat  My review and personal interpretation at Time: 11:00   Indication: weakness  Rate: 75  Rhythm: afib Axis: normal Other: prolonged qt, poor rwav progression, st abn resolved ____________________________________________  RADIOLOGY  I personally reviewed all radiographic images ordered to evaluate for the above acute complaints and reviewed radiology reports and findings.  These findings were personally discussed with the patient.  Please see medical record for radiology report.  ____________________________________________   PROCEDURES  Procedure(s) performed:  Procedures    Critical Care performed: no ____________________________________________   INITIAL IMPRESSION / ASSESSMENT AND PLAN / ED COURSE  Pertinent labs & imaging results that were available during my care of the patient were reviewed by me and considered in my medical decision making (see chart for details).   DDX: Dehydration, sepsis, pna, uti, hypoglycemia, cva, drug effect, withdrawal, encephalitis   [name] is a 80 y.o. who presents to the ED with presentation as described above.  Patient somewhat of a poor historian.  EKG somewhat changed but if she is  hypertensive denying any chest pain.  No significant respiratory symptoms.  CT head ordered for the above differential.  No evidence of pulmonary edema.  Does have history of A. fib  on Coumadin hypertensive with symptoms concerning for possible CVA or hypertensive urgency.  Will order MRI.  Will allow permissive hypertensive at this time pending results of MRI.  Will discuss with hospitalist for admission.  Patient agreeable to plan.     The patient was evaluated in Emergency Department today for the symptoms described in the history of present illness. He/she was evaluated in the context of the global COVID-19 pandemic, which necessitated consideration that the patient might be at risk for infection with the SARS-CoV-2 virus that causes COVID-19. Institutional protocols and algorithms that pertain to the evaluation of patients at risk for COVID-19 are in a state of rapid change based on information released by regulatory bodies including the CDC and federal and state organizations. These policies and algorithms were followed during the patient's care in the ED.  As part of my medical decision making, I reviewed the following data within the [facility] notes reviewed and incorporated, Labs reviewed, notes from prior ED visits and [place] Controlled Substance Database   ____________________________________________   FINAL CLINICAL IMPRESSION(S) / ED DIAGNOSES  Final diagnoses:  Weakness  Hypertension, unspecified type  Atrial fibrillation, unspecified type ([place])      NEW MEDICATIONS STARTED DURING THIS VISIT:  New Prescriptions   No medications on file     Note:  This document was prepared using Dragon voice recognition software and may include unintentional dictation errors.    Merlyn Lot, MD 12/23/20 [phone]

## 2020-12-23 NOTE — ED Notes (Signed)
Patient remains off the unit in MRI.

## 2020-12-23 NOTE — ED Notes (Signed)
Called Lab to draw blood.

## 2020-12-23 NOTE — H&P (Signed)
History and Physical    [name]:[id number] DOB: 10/15/1941 DOA: 12/23/2020  PCP: Lavera Guise, MD   Patient coming from: Home  I have personally briefly reviewed patient's old medical records in [place]  Chief Complaint: Weakness  HPI: [name] is a 80 y.o. female with medical history significant for atrial fibrillation on chronic anticoagulation therapy with Coumadin, diabetes mellitus, hypertension, chronic diastolic dysfunction CHF, nicotine dependence, GERD and dyslipidemia who presents to the emergency room for evaluation of generalized weakness which he has had for about 2 weeks.  Patient states that she was transfused about 2 weeks ago and since then she has had progressively worsening weakness.  She denies having any falls and denies feeling dizzy or lightheaded.  EMS was called today because she was unable to get up due to the severity of the weakness.  She is unable to tell me the color of her stools but denies having any hematemesis or hematuria.  She denies any NSAID use.  She complains of a headache but denies having any blurred vision or difficulty swallowing.  She has no focal deficits and is able to move all extremities. She denies having any chest pain, no shortness of breath, no nausea, no vomiting, no palpitations, no diaphoresis, no urinary frequency, no nocturia, no dysuria, no abdominal pain, no constipation, no diarrhea, no cough, no fever or chills. Upon arrival to the ER she was noted to have significant elevated blood pressure, 204/70. Labs show sodium 137, potassium 4.1, chloride 108, bicarb 22, glucose 187, BUN 17, creatinine 2.02, calcium 8.3, troponin XVIII, hemoglobin 0.7, hematocrit 28.8, white count 6.8, MCV 80.9, RDW 21.9, platelet count 221 Respiratory viral panel is negative CT scan of the head without contrast shows mild diffuse cortical atrophy. Mild chronic ischemic white matter disease. No acute intracranial abnormality seen. Chest  x-ray reviewed by me shows chronic changes.  No obvious infiltrate or effusion. Twelve-lead EKG reviewed by me shows atrial fibrillation   ED Course: Patient is a 80 year old female who presents to the ER for evaluation of generalized weakness and was found to have significant elevated blood pressure, 204/70.  ER physician is concerned about a possible stroke and has ordered an MRI of the brain without contrast to rule out an acute stroke.  Permissive hypertension until MRI results become available.  Patient will be referred to observation status for further evaluation.    Review of Systems: As per HPI otherwise all other systems reviewed and negative.    Past Medical History:  Diagnosis Date  . Anemia   . Atrial fibrillation ([place])   . Congestive heart failure (CHF) ([place])   . Diabetes mellitus without complication ([place])   . GERD (gastroesophageal reflux disease)   . Hyperlipidemia   . Hypertension     Past Surgical History:  Procedure Laterality Date  . ABDOMINAL HYSTERECTOMY    . APPENDECTOMY    . CATARACT EXTRACTION    . CHOLECYSTECTOMY    . HERNIA REPAIR    . right knee replacement    . right nephroectomy       reports that she has been smoking cigarettes. She has been smoking about 1.00 pack per day. She has never used smokeless tobacco. She reports that she does not drink alcohol and does not use drugs.  No Known Allergies  Family History  Problem Relation Age of Onset  . Breast cancer Mother       Prior to Admission medications   Medication  Sig Start Date End Date Taking? Authorizing Provider  atorvastatin (LIPITOR) 10 MG tablet Take 1 tablet (10 mg total) by mouth every evening. 12/06/20  Yes McDonough, Lauren K, PA-C  citalopram (CELEXA) 40 MG tablet Take 1 tablet (40 mg total) by mouth daily. 10/20/20  Yes Lavera Guise, MD  digoxin (LANOXIN) 0.125 MG tablet Take 1 tablet (0.125 mg total) by mouth daily. 12/06/20  Yes McDonough, Lauren K, PA-C  donepezil  (ARICEPT) 5 MG tablet TAKE 1 TABLET BY MOUTH ONCE DAILY FOR MEMORY 12/06/20  Yes McDonough, Lauren K, PA-C  insulin glargine, 2 Unit Dial, (TOUJEO MAX SOLOSTAR) 300 UNIT/ML Solostar Pen Take 6 to 10 units with supper( needs needles as well e 11.65 12/20/20  Yes Lavera Guise, MD  pantoprazole (PROTONIX) 40 MG tablet Take 1 tablet (40 mg total) by mouth 2 (two) times daily. 10/20/20  Yes Lavera Guise, MD  rOPINIRole (REQUIP) 0.5 MG tablet Take 1 tablet (0.5 mg total) by mouth every evening. 10/20/20  Yes Lavera Guise, MD  warfarin (COUMADIN) 2 MG tablet Take 1 tablet (2 mg total) by mouth daily. 12/06/20  Yes McDonoughSi Gaul, PA-C    Physical Exam: Vitals:   12/23/20 1033 12/23/20 1034 12/23/20 1115 12/23/20 1300  BP: (!) 200/52  (!) 205/73 (!) 211/67  Pulse: 78  68 (!) 57  Resp: 15  20 18   Temp: 98.2 F (36.8 C)     TempSrc: Oral     SpO2: 98%  98% 96%  Weight:  59 kg    Height:  4\' 10"  (1.473 m)       Vitals:   12/23/20 1033 12/23/20 1034 12/23/20 1115 12/23/20 1300  BP: (!) 200/52  (!) 205/73 (!) 211/67  Pulse: 78  68 (!) 57  Resp: 15  20 18   Temp: 98.2 F (36.8 C)     TempSrc: Oral     SpO2: 98%  98% 96%  Weight:  59 kg    Height:  4\' 10"  (1.473 m)        Constitutional: Alert and oriented x 3 . Not in any apparent distress HEENT:      Head: Normocephalic and atraumatic.         Eyes: PERLA, EOMI, Conjunctivae is pale. Sclera is non-icteric.       Mouth/Throat: Mucous membranes are moist.       Neck: Supple with no signs of meningismus. Cardiovascular: Regular rate and rhythm. No murmurs, gallops, or rubs. 2+ symmetrical distal pulses are present . No JVD. No LE edema Respiratory: Respiratory effort normal .Lungs sounds clear bilaterally. No wheezes, crackles, or rhonchi.  Gastrointestinal: Soft, non tender, and non distended with positive bowel sounds.  Genitourinary: No CVA tenderness. Musculoskeletal: Nontender with normal range of motion in all extremities. No  cyanosis, or erythema of extremities. Neurologic:  Face is symmetric. Moving all extremities. No gross focal neurologic deficits.  Generalized weakness Skin: Skin is warm, dry.  No rash or ulcers Psychiatric: Mood and affect are normal   Labs on Admission: I have personally reviewed following labs and imaging studies  CBC: Recent Labs  Lab 12/23/20 1040  WBC 6.8  HGB 8.7*  HCT 28.8*  MCV 80.9  PLT 160   Basic Metabolic Panel: Recent Labs  Lab 12/23/20 1040  NA 137  K 4.1  CL 108  CO2 22  GLUCOSE 187*  BUN 17  CREATININE 2.02*  CALCIUM 8.3*   GFR: Estimated Creatinine Clearance: 17.1 mL/min (A) (  by C-G formula based on SCr of 2.02 mg/dL (H)). Liver Function Tests: No results for input(s): AST, ALT, ALKPHOS, BILITOT, PROT, ALBUMIN in the last 168 hours. No results for input(s): LIPASE, AMYLASE in the last 168 hours. No results for input(s): AMMONIA in the last 168 hours. Coagulation Profile: Recent Labs  Lab 12/23/20 1507  INR 1.4*   Cardiac Enzymes: No results for input(s): CKTOTAL, CKMB, CKMBINDEX, TROPONINI in the last 168 hours. BNP (last 3 results) No results for input(s): PROBNP in the last 8760 hours. HbA1C: No results for input(s): HGBA1C in the last 72 hours. CBG: No results for input(s): GLUCAP in the last 168 hours. Lipid Profile: No results for input(s): CHOL, HDL, LDLCALC, TRIG, CHOLHDL, LDLDIRECT in the last 72 hours. Thyroid Function Tests: No results for input(s): TSH, T4TOTAL, FREET4, T3FREE, THYROIDAB in the last 72 hours. Anemia Panel: No results for input(s): VITAMINB12, FOLATE, FERRITIN, TIBC, IRON, RETICCTPCT in the last 72 hours. Urine analysis:    Component Value Date/Time   COLORURINE YELLOW (A) 12/23/2020 1040   APPEARANCEUR HAZY (A) 12/23/2020 1040   APPEARANCEUR Cloudy (A) 03/24/2020 0000   LABSPEC 1.013 12/23/2020 1040   PHURINE 6.0 12/23/2020 1040   GLUCOSEU 150 (A) 12/23/2020 1040   HGBUR MODERATE (A) 12/23/2020 1040    BILIRUBINUR NEGATIVE 12/23/2020 1040   BILIRUBINUR Negative 03/24/2020 0000   KETONESUR NEGATIVE 12/23/2020 1040   PROTEINUR >=300 (A) 12/23/2020 1040   UROBILINOGEN 0.2 09/02/2019 1441   NITRITE NEGATIVE 12/23/2020 1040   LEUKOCYTESUR NEGATIVE 12/23/2020 1040    Radiological Exams on Admission: CT Head Wo Contrast  Result Date: 12/23/2020 CLINICAL DATA:  Generalized weakness. EXAM: CT HEAD WITHOUT CONTRAST TECHNIQUE: Contiguous axial images were obtained from the base of the skull through the vertex without intravenous contrast. COMPARISON:  June 25, 2018. FINDINGS: Brain: Mild diffuse cortical atrophy is noted. Mild chronic ischemic white matter disease is noted. No mass effect or midline shift is noted. Ventricular size is within normal limits. There is no evidence of mass lesion, hemorrhage or acute infarction. Vascular: No hyperdense vessel or unexpected calcification. Skull: Normal. Negative for fracture or focal lesion. Sinuses/Orbits: Right sphenoid sinusitis is noted. Other: None. IMPRESSION: Mild diffuse cortical atrophy. Mild chronic ischemic white matter disease. No acute intracranial abnormality seen. Electronically Signed   By: Marijo Conception M.D.   On: 12/23/2020 14:02   DG Chest Portable 1 View  Result Date: 12/23/2020 CLINICAL DATA:  Darrick Grinder the breath, weakness EXAM: PORTABLE CHEST 1 VIEW COMPARISON:  01/03/2019 FINDINGS: Heart is normal size. Peribronchial thickening and interstitial prominence throughout the lungs. Low lung volumes. Bibasilar atelectasis. No effusions. No acute bony abnormality. Aortic atherosclerosis. IMPRESSION: Peribronchial thickening and interstitial prominence may reflect bronchitic changes or atypical infection. Bibasilar atelectasis. Electronically Signed   By: Rolm Baptise M.D.   On: 12/23/2020 12:03     Assessment/Plan Principal Problem:   Hypertensive urgency Active Problems:   Chronic combined systolic and diastolic heart failure (HCC)    GERD (gastroesophageal reflux disease)   Atrial fibrillation ([place])   CKD stage 4 due to type 2 diabetes mellitus (HCC)   Nicotine dependence   History of anemia due to chronic kidney disease     Hypertensive urgency Patient presents with generalized weakness ??  Rule out acute CVA Patient had a CT scan of the head without contrast which was negative for intracranial bleed Follow-up results of MRI of the brain without contrast to rule out an acute stroke We will allow for  permissive hypertension until an acute stroke is ruled out Obtain 2D echocardiogram to assess LVEF   Chronic atrial fibrillation Rate controlled on digoxin Patient is on Coumadin but has a subtherapeutic INR Continue Coumadin We will place patient on Lovenox for DVT prophylaxis    Nicotine dependence Patient admits to smoking 1 pack of cigarettes daily Smoking cessation was discussed with her in detail She declines a nicotine transdermal patch at this time    History of anemia due to chronic kidney disease Patient is status post recent blood transfusion Noted to have low serum iron levels Place patient on iron supplements    Diabetes mellitus with complications of stage IV chronic kidney disease We will keep patient n.p.o. for now until acute stroke is ruled out Patient has had episodes of hypoglycemia at home CBG q 4 hours   Dementia Continue Aricept and citalopram   GERD Continue Protonix  DVT prophylaxis: Lovenox Code Status: DNR Family Communication: Greater than 50% of time was spent discussing patient's condition and plan of care with her daughter Ms. [name] over the phone.  All questions and concerns have been addressed.  CODE STATUS was discussed and patient is a DNR. Disposition Plan: Back to previous home environment Consults called: none Status: Observation    Tochukwu Agbata MD [facility]     12/23/2020, 4:05 PM

## 2020-12-23 NOTE — ED Notes (Signed)
Patient transported to MRI 

## 2020-12-23 NOTE — ED Notes (Addendum)
Patient prepared for MRI. Test explained to patient. Patient denies questions.

## 2020-12-23 NOTE — Consult Note (Signed)
ANTICOAGULATION CONSULT NOTE - Consult  Pharmacy Consult for Warfarin Indication: atrial fibrillation  No Known Allergies  Patient Measurements: Height: 4\' 10"  (147.3 cm) Weight: 59 kg (130 lb) IBW/kg (Calculated) : 40.9  Vital Signs: Temp: 98.2 F (36.8 C) (03/03 1033) Temp Source: Oral (03/03 1033) BP: 191/48 (03/03 1615) Pulse Rate: 60 (03/03 1615)  Labs: Recent Labs    12/23/20 1040 12/23/20 1124 12/23/20 1252 12/23/20 1507  HGB 8.7*  --   --   --   HCT 28.8*  --   --   --   PLT 221  --   --   --   LABPROT  --   --   --  16.8*  INR  --   --   --  1.4*  CREATININE 2.02*  --   --   --   TROPONINIHS  --  18* 18* 19*    Estimated Creatinine Clearance: 17.1 mL/min (A) (by C-G formula based on SCr of 2.02 mg/dL (H)).   Medications:  Warfarin 2mg  QD PTA No other APT. DDIs: APAP prn, Celexa & tramodol, ropinorole, digoxin  Assessment: 80 y.o. female PMH of Afib (on coumadin INR 2-3), DM, HTN, diaCHF, nicotine dependence, GERD and HLD presenting with generalized weakness x2wks. Pharmacy consulted to manage warfarin while inpatient.  Baseline:  Hgb 8.7 (anemia of CKD) ; Plts 221; trop: 19>18  Date Time INR Dose/Comment 3/03 1507 1.4 (last dose 3/02 unk time)  Goal of Therapy:  INR 2-3 Monitor platelets by anticoagulation protocol: Yes   Plan:  PTA dose was 2mg  nightly. Pt INR 1.4 currently and last dose was 3/02. No reportedly missed doses.  Will increase to 3mg  x1 tonight and follow up with daily INR's.  Shanon Brow Dazha Kempa 12/23/2020,5:01 PM

## 2020-12-23 NOTE — ED Notes (Signed)
Patient returned to ED from MRI. No needs expressed by patient.

## 2020-12-23 NOTE — Telephone Encounter (Signed)
Pt's daughter called and advised that patient was having weakness and very shaky as when she stands her whole body just shakes bad, also she has a slight headache above her eyebrows.  She advised she is not sure if her blood is low again or she could have had a mini stroke.  Pt told daughter that something just doesn't feel right.  Blood sugars were checked, yesterday evening it was 173 and this morning it was 112.  It was also very hard for pt to get up because of the shaking.  I consulted with Dr Clayborn Bigness and we advised patients daughter for her to call EMS or take pt to the ER.

## 2020-12-23 NOTE — ED Notes (Signed)
Troponin   Lab advised they will run the add on trop

## 2020-12-23 NOTE — ED Notes (Signed)
Patient transported to CT 

## 2020-12-23 NOTE — ED Notes (Signed)
Patient continues to wait for inpatient bed assignment. Patient denies needs at this time. Patient aware of delay in bed assignment and potential wait time.

## 2020-12-23 NOTE — ED Triage Notes (Addendum)
Pt comes into the ED via EMS from home with c/o generalized weakness x2 weeks, states she had a transfusion 2 weeks ago, denies any pain, denies any black or bloody stools, states she lives with her daughter.  204/70 76HR 96%RA

## 2020-12-23 NOTE — ED Notes (Signed)
Lab at bedside

## 2020-12-23 NOTE — ED Notes (Signed)
Attending sent a secure chat to discuss patient diet order post swallow screen.

## 2020-12-23 NOTE — ED Notes (Signed)
Patient transported to floor at this time.

## 2020-12-24 ENCOUNTER — Inpatient Hospital Stay (HOSPITAL_COMMUNITY)
Admit: 2020-12-24 | Discharge: 2020-12-24 | Disposition: A | Discharge status: Home / Self Care | Payer: Medicare HMO | Attending: Internal Medicine | Admitting: Internal Medicine

## 2020-12-24 DIAGNOSIS — M4802 Spinal stenosis, cervical region: Secondary | ICD-10-CM | POA: Diagnosis not present

## 2020-12-24 DIAGNOSIS — G2581 Restless legs syndrome: Secondary | ICD-10-CM | POA: Diagnosis present

## 2020-12-24 DIAGNOSIS — Z7901 Long term (current) use of anticoagulants: Secondary | ICD-10-CM | POA: Diagnosis not present

## 2020-12-24 DIAGNOSIS — M50222 Other cervical disc displacement at C5-C6 level: Secondary | ICD-10-CM | POA: Diagnosis not present

## 2020-12-24 DIAGNOSIS — Z803 Family history of malignant neoplasm of breast: Secondary | ICD-10-CM | POA: Diagnosis not present

## 2020-12-24 DIAGNOSIS — I350 Nonrheumatic aortic (valve) stenosis: Secondary | ICD-10-CM | POA: Diagnosis not present

## 2020-12-24 DIAGNOSIS — Z6822 Body mass index (BMI) 22.0-22.9, adult: Secondary | ICD-10-CM | POA: Diagnosis not present

## 2020-12-24 DIAGNOSIS — I130 Hypertensive heart and chronic kidney disease with heart failure and stage 1 through stage 4 chronic kidney disease, or unspecified chronic kidney disease: Secondary | ICD-10-CM | POA: Diagnosis present

## 2020-12-24 DIAGNOSIS — Z794 Long term (current) use of insulin: Secondary | ICD-10-CM | POA: Diagnosis not present

## 2020-12-24 DIAGNOSIS — Z66 Do not resuscitate: Secondary | ICD-10-CM | POA: Diagnosis present

## 2020-12-24 DIAGNOSIS — M545 Low back pain, unspecified: Secondary | ICD-10-CM | POA: Diagnosis not present

## 2020-12-24 DIAGNOSIS — I421 Obstructive hypertrophic cardiomyopathy: Secondary | ICD-10-CM

## 2020-12-24 DIAGNOSIS — I351 Nonrheumatic aortic (valve) insufficiency: Secondary | ICD-10-CM | POA: Diagnosis not present

## 2020-12-24 DIAGNOSIS — R531 Weakness: Secondary | ICD-10-CM

## 2020-12-24 DIAGNOSIS — I482 Chronic atrial fibrillation, unspecified: Secondary | ICD-10-CM | POA: Diagnosis present

## 2020-12-24 DIAGNOSIS — D509 Iron deficiency anemia, unspecified: Secondary | ICD-10-CM | POA: Diagnosis present

## 2020-12-24 DIAGNOSIS — D631 Anemia in chronic kidney disease: Secondary | ICD-10-CM | POA: Diagnosis present

## 2020-12-24 DIAGNOSIS — R791 Abnormal coagulation profile: Secondary | ICD-10-CM | POA: Diagnosis present

## 2020-12-24 DIAGNOSIS — E785 Hyperlipidemia, unspecified: Secondary | ICD-10-CM | POA: Diagnosis present

## 2020-12-24 DIAGNOSIS — N184 Chronic kidney disease, stage 4 (severe): Secondary | ICD-10-CM | POA: Diagnosis present

## 2020-12-24 DIAGNOSIS — R54 Age-related physical debility: Secondary | ICD-10-CM | POA: Diagnosis present

## 2020-12-24 DIAGNOSIS — R251 Tremor, unspecified: Secondary | ICD-10-CM | POA: Diagnosis present

## 2020-12-24 DIAGNOSIS — K219 Gastro-esophageal reflux disease without esophagitis: Secondary | ICD-10-CM | POA: Diagnosis present

## 2020-12-24 DIAGNOSIS — Z20822 Contact with and (suspected) exposure to covid-19: Secondary | ICD-10-CM | POA: Diagnosis present

## 2020-12-24 DIAGNOSIS — I5042 Chronic combined systolic (congestive) and diastolic (congestive) heart failure: Secondary | ICD-10-CM | POA: Diagnosis present

## 2020-12-24 DIAGNOSIS — F17210 Nicotine dependence, cigarettes, uncomplicated: Secondary | ICD-10-CM | POA: Diagnosis present

## 2020-12-24 DIAGNOSIS — I160 Hypertensive urgency: Secondary | ICD-10-CM | POA: Diagnosis present

## 2020-12-24 DIAGNOSIS — F0390 Unspecified dementia without behavioral disturbance: Secondary | ICD-10-CM | POA: Diagnosis present

## 2020-12-24 DIAGNOSIS — E538 Deficiency of other specified B group vitamins: Secondary | ICD-10-CM | POA: Diagnosis present

## 2020-12-24 DIAGNOSIS — M50221 Other cervical disc displacement at C4-C5 level: Secondary | ICD-10-CM | POA: Diagnosis not present

## 2020-12-24 DIAGNOSIS — E1122 Type 2 diabetes mellitus with diabetic chronic kidney disease: Secondary | ICD-10-CM | POA: Diagnosis present

## 2020-12-24 DIAGNOSIS — Z96651 Presence of right artificial knee joint: Secondary | ICD-10-CM | POA: Diagnosis present

## 2020-12-24 LAB — COMPREHENSIVE METABOLIC PANEL
ALT: 9 U/L (ref 0–44)
AST: 15 U/L (ref 15–41)
Albumin: 2.2 g/dL — ABNORMAL LOW (ref 3.5–5.0)
Alkaline Phosphatase: 79 U/L (ref 38–126)
Anion gap: 8 (ref 5–15)
BUN: 17 mg/dL (ref 8–23)
CO2: 22 mmol/L (ref 22–32)
Calcium: 8.1 mg/dL — ABNORMAL LOW (ref 8.9–10.3)
Chloride: 110 mmol/L (ref 98–111)
Creatinine, Ser: 1.84 mg/dL — ABNORMAL HIGH (ref 0.44–1.00)
GFR, Estimated: 28 mL/min — ABNORMAL LOW (ref >60)
Glucose, Bld: 115 mg/dL — ABNORMAL HIGH (ref 70–99)
Potassium: 4.2 mmol/L (ref 3.5–5.1)
Sodium: 140 mmol/L (ref 135–145)
Total Bilirubin: 0.9 mg/dL (ref 0.3–1.2)
Total Protein: 6.1 g/dL — ABNORMAL LOW (ref 6.5–8.1)

## 2020-12-24 LAB — CBC
HCT: 29.4 % — ABNORMAL LOW (ref 36.0–46.0)
Hemoglobin: 9.1 g/dL — ABNORMAL LOW (ref 12.0–15.0)
MCH: 24.7 pg — ABNORMAL LOW (ref 26.0–34.0)
MCHC: 31 g/dL (ref 30.0–36.0)
MCV: 79.7 fL — ABNORMAL LOW (ref 80.0–100.0)
Platelets: 215 10*3/uL (ref 150–400)
RBC: 3.69 MIL/uL — ABNORMAL LOW (ref 3.87–5.11)
RDW: 22.4 % — ABNORMAL HIGH (ref 11.5–15.5)
WBC: 8 10*3/uL (ref 4.0–10.5)
nRBC: 0 % (ref 0.0–0.2)

## 2020-12-24 LAB — LIPID PANEL
Cholesterol: 97 mg/dL (ref 0–200)
HDL: 22 mg/dL — ABNORMAL LOW (ref >40)
LDL Cholesterol: 44 mg/dL (ref 0–99)
Total CHOL/HDL Ratio: 4.4 RATIO
Triglycerides: 153 mg/dL — ABNORMAL HIGH (ref <150)
VLDL: 31 mg/dL (ref 0–40)

## 2020-12-24 LAB — ECHOCARDIOGRAM COMPLETE
AR max vel: 1.81 cm2
AV Area VTI: 2 cm2
AV Area mean vel: 2.01 cm2
AV Mean grad: 4.7 mmHg
AV Peak grad: 8.1 mmHg
Ao pk vel: 1.43 m/s
Area-P 1/2: 5.46 cm2
Height: 58 in
P 1/2 time: 598 msec
S' Lateral: 2.7 cm
Weight: 2080 oz

## 2020-12-24 LAB — HEMOGLOBIN A1C
Hgb A1c MFr Bld: 6.9 % — ABNORMAL HIGH (ref 4.8–5.6)
Mean Plasma Glucose: 151.33 mg/dL

## 2020-12-24 LAB — PROTIME-INR
INR: 1.4 — ABNORMAL HIGH (ref 0.8–1.2)
Prothrombin Time: 16.5 seconds — ABNORMAL HIGH (ref 11.4–15.2)

## 2020-12-24 LAB — CK: Total CK: 52 U/L (ref 38–234)

## 2020-12-24 LAB — GLUCOSE, CAPILLARY
Glucose-Capillary: 118 mg/dL — ABNORMAL HIGH (ref 70–99)
Glucose-Capillary: 192 mg/dL — ABNORMAL HIGH (ref 70–99)
Glucose-Capillary: 170 mg/dL — ABNORMAL HIGH (ref 70–99)
Glucose-Capillary: 171 mg/dL — ABNORMAL HIGH (ref 70–99)

## 2020-12-24 LAB — TSH: TSH: 2.913 u[IU]/mL (ref 0.350–4.500)

## 2020-12-24 LAB — HEPATITIS C ANTIBODY: HCV Ab: NONREACTIVE

## 2020-12-24 MED ORDER — INSULIN GLARGINE 100 UNIT/ML ~~LOC~~ SOLN
6.0000 [IU] | Freq: Every day | SUBCUTANEOUS | Status: DC
  Administered 2020-12-25: 6 [IU] via SUBCUTANEOUS
  Filled 2020-12-24 (×2): qty 0.06

## 2020-12-24 MED ORDER — DOCUSATE SODIUM 100 MG PO CAPS
200.0000 mg | ORAL_CAPSULE | Freq: Every day | ORAL | Status: DC
  Administered 2020-12-24 – 2020-12-29 (×6): 200 mg via ORAL
  Filled 2020-12-24 (×6): qty 2

## 2020-12-24 MED ORDER — INSULIN ASPART 100 UNIT/ML ~~LOC~~ SOLN: 0.0000 [IU] | Freq: Every day | SUBCUTANEOUS | Status: DC

## 2020-12-24 MED ORDER — INSULIN ASPART 100 UNIT/ML ~~LOC~~ SOLN: 0.0000 [IU] | Freq: Three times a day (TID) | SUBCUTANEOUS | Status: DC

## 2020-12-24 MED ORDER — AMLODIPINE BESYLATE 10 MG PO TABS
10.0000 mg | ORAL_TABLET | Freq: Once | ORAL | Status: AC
  Administered 2020-12-24: 10 mg via ORAL
  Filled 2020-12-24: qty 1

## 2020-12-24 MED ORDER — INSULIN GLARGINE 100 UNIT/ML ~~LOC~~ SOLN: 10.0000 [IU] | Freq: Every day | SUBCUTANEOUS | Status: DC

## 2020-12-24 MED ORDER — WARFARIN SODIUM 3 MG PO TABS
3.0000 mg | ORAL_TABLET | Freq: Once | ORAL | Status: AC
  Administered 2020-12-24: 3 mg via ORAL
  Filled 2020-12-24: qty 1

## 2020-12-24 NOTE — Progress Notes (Signed)
PROGRESS NOTE    [name]  UVO:[id number] DOB: 14-Jan-1941 DOA: 12/23/2020 PCP: Lavera Guise, MD      Brief Narrative:   : [name] is a 80 y.o. female with medical history significant for atrial fibrillation on chronic anticoagulation therapy with Coumadin, diabetes mellitus, hypertension, chronic diastolic dysfunction CHF, nicotine dependence, GERD and dyslipidemia who presents to the emergency room for evaluation of generalized weakness which he has had for about 2 weeks.  Patient states that she was transfused about 2 weeks ago and since then she has had progressively worsening weakness.  She denies having any falls and denies feeling dizzy or lightheaded.  EMS was called today because she was unable to get up due to the severity of the weakness.  She is unable to tell me the color of her stools but denies having any hematemesis or hematuria.  She denies any NSAID use.  She complains of a headache but denies having any blurred vision or difficulty swallowing.  She has no focal deficits and is able to move all extremities. She denies having any chest pain, no shortness of breath, no nausea, no vomiting, no palpitations, no diaphoresis, no urinary frequency, no nocturia, no dysuria, no abdominal pain, no constipation, no diarrhea, no cough, no fever or chills.   Assessment & Plan:   Principal Problem:   Hypertensive urgency Active Problems:   GERD (gastroesophageal reflux disease)   Chronic combined systolic and diastolic heart failure (HCC)   Atrial fibrillation (HCC)   CKD stage 4 due to type 2 diabetes mellitus (HCC)   Nicotine dependence   History of anemia due to chronic kidney disease  # Generalized weakness and fatigue Several week history of this. Nothing focal. Recent blood transfusion for iron deficiency anemia thought to be 2/2 ckd, hgb today is improved (9.1) and stable. No electrolyte abnormalities. CT/MRI of head w/o acute findings. CXR nothing acute. Non-focal  exam. Suspect this is dementia-related decline. ua not strongly suggestive of infection - f/u tsh, CK, LFTs, TTE, urine culture, b12 - PT/OT consult, daughter is open to snf  # Hypertensive urgency CT/MRI neg. Kidney function at baseline. BPs have improved to 185/65 - have started amlodipine, increase to 10 - labetalol prn - home statin  # Chronic atrial fibrillation Rate controlled on digoxin which is therapeutic. INR subtherapeutic - cont home digoxin, warfarin per pharmacy consult - tele  # Dementia # MDD - cont home aricept, celexa  # T2DM Here glucose wnl - cont home lantus 6, monitor fasting sugars  # Restless leg - cont requip  # Iron deficiency anemia Recent blood transfusion. Hgb stable. No report of bleeding/melena. - cont warfarin - monitor - outpt f/u assuming hgb remains stable  # CKD 3/4 Cr 2 which is stable - monitor    DVT prophylaxis: home warfarin Code Status: dnr Family Communication: daughter updated telephonically 3/4  Level of care: Progressive Cardiac Status is: inpt  The patient will require care spanning > 2 midnights and should be moved to inpatient because: Inpatient level of care appropriate due to severity of illness  Dispo: The patient is from: Home              Anticipated d/c is to: tbd              Patient currently is not medically stable to d/c.   Difficult to place patient No        Consultants:  none  Procedures: none  Antimicrobials:  none    Subjective: This morning no complaints. Has appetite. No chest pain or cough. No abd pain. No n/v.  Objective: Vitals:   12/23/20 2230 12/23/20 2343 12/24/20 0419 12/24/20 0734  BP: (!) 176/73 (!) 189/66 (!) 199/65 (!) 185/65  Pulse: (!) 50 (!) 56 63 72  Resp: 20 15 15 16   Temp:  98.3 F (36.8 C) 97.9 F (36.6 C) 98 F (36.7 C)  TempSrc:   Oral Oral  SpO2: 98% 97% 94% 97%  Weight:      Height:        Intake/Output Summary (Last 24 hours) at 12/24/2020  0826 Last data filed at 12/24/2020 0517 Gross per 24 hour  Intake 594.55 ml  Output 250 ml  Net 344.55 ml   Filed Weights   12/23/20 1034  Weight: 59 kg    Examination:  General exam: Appears calm and comfortable. adentous Respiratory system: few scattered rhonchi. Respiratory effort normal. Cardiovascular system: irreg irreg, faint systolic murmur Gastrointestinal system: Abdomen is nondistended, soft and nontender. No organomegaly or masses felt. Normal bowel sounds heard. Central nervous system: Alert and oriented. No focal neurological deficits. Extremities: Symmetric 5 x 5 power. Skin: No rashes, lesions or ulcers Psychiatry: Judgement and insight appear normal. Mood & affect appropriate.     Data Reviewed: I have personally reviewed following labs and imaging studies  CBC: Recent Labs  Lab 12/23/20 1040 12/24/20 0456  WBC 6.8 8.0  HGB 8.7* 9.1*  HCT 28.8* 29.4*  MCV 80.9 79.7*  PLT 221 852   Basic Metabolic Panel: Recent Labs  Lab 12/23/20 1040  NA 137  K 4.1  CL 108  CO2 22  GLUCOSE 187*  BUN 17  CREATININE 2.02*  CALCIUM 8.3*   GFR: Estimated Creatinine Clearance: 17.1 mL/min (A) (by C-G formula based on SCr of 2.02 mg/dL (H)). Liver Function Tests: No results for input(s): AST, ALT, ALKPHOS, BILITOT, PROT, ALBUMIN in the last 168 hours. No results for input(s): LIPASE, AMYLASE in the last 168 hours. No results for input(s): AMMONIA in the last 168 hours. Coagulation Profile: Recent Labs  Lab 12/23/20 1507 12/24/20 0456  INR 1.4* 1.4*   Cardiac Enzymes: No results for input(s): CKTOTAL, CKMB, CKMBINDEX, TROPONINI in the last 168 hours. BNP (last 3 results) No results for input(s): PROBNP in the last 8760 hours. HbA1C: No results for input(s): HGBA1C in the last 72 hours. CBG: Recent Labs  Lab 12/23/20 1613 12/23/20 1934 12/23/20 2349 12/24/20 0735  GLUCAP 99 106* 106* 118*   Lipid Profile: Recent Labs    12/24/20 0456  CHOL 97   HDL 22*  LDLCALC 44  TRIG 153*  CHOLHDL 4.4   Thyroid Function Tests: No results for input(s): TSH, T4TOTAL, FREET4, T3FREE, THYROIDAB in the last 72 hours. Anemia Panel: No results for input(s): VITAMINB12, FOLATE, FERRITIN, TIBC, IRON, RETICCTPCT in the last 72 hours. Urine analysis:    Component Value Date/Time   COLORURINE YELLOW (A) 12/23/2020 1040   APPEARANCEUR HAZY (A) 12/23/2020 1040   APPEARANCEUR Cloudy (A) 03/24/2020 0000   LABSPEC 1.013 12/23/2020 1040   PHURINE 6.0 12/23/2020 1040   GLUCOSEU 150 (A) 12/23/2020 1040   HGBUR MODERATE (A) 12/23/2020 1040   BILIRUBINUR NEGATIVE 12/23/2020 1040   BILIRUBINUR Negative 03/24/2020 0000   KETONESUR NEGATIVE 12/23/2020 1040   PROTEINUR >=300 (A) 12/23/2020 1040   UROBILINOGEN 0.2 09/02/2019 1441   NITRITE NEGATIVE 12/23/2020 1040   LEUKOCYTESUR NEGATIVE 12/23/2020 1040   Sepsis Labs: @LABRCNTIP (procalcitonin:4,lacticidven:4)  )  Recent Results (from the past 240 hour(s))  Resp Panel by RT-PCR (Flu A&B, Covid) Nasopharyngeal Swab     Status: None   Collection Time: 12/23/20 12:52 PM   Specimen: Nasopharyngeal Swab; Nasopharyngeal(NP) swabs in vial transport medium  Result Value Ref Range Status   SARS Coronavirus 2 by RT PCR NEGATIVE NEGATIVE Final    Comment: (NOTE) SARS-CoV-2 target nucleic acids are NOT DETECTED.  The SARS-CoV-2 RNA is generally detectable in upper respiratory specimens during the acute phase of infection. The lowest concentration of SARS-CoV-2 viral copies this assay can detect is 138 copies/mL. A negative result does not preclude SARS-Cov-2 infection and should not be used as the sole basis for treatment or other patient management decisions. A negative result may occur with  improper specimen collection/handling, submission of specimen other than nasopharyngeal swab, presence of viral mutation(s) within the areas targeted by this assay, and inadequate number of viral copies(<138 copies/mL).  A negative result must be combined with clinical observations, patient history, and epidemiological information. The expected result is Negative.  Fact Sheet for Patients:  EntrepreneurPulse.com.au  Fact Sheet for Healthcare Providers:  IncredibleEmployment.be  This test is no t yet approved or cleared by the [place] FDA and  has been authorized for detection and/or diagnosis of SARS-CoV-2 by FDA under an Emergency Use Authorization (EUA). This EUA will remain  in effect (meaning this test can be used) for the duration of the COVID-19 declaration under Section 564(b)(1) of the Act, 21 U.S.C.section 360bbb-3(b)(1), unless the authorization is terminated  or revoked sooner.       Influenza A by PCR NEGATIVE NEGATIVE Final   Influenza B by PCR NEGATIVE NEGATIVE Final    Comment: (NOTE) The Xpert Xpress SARS-CoV-2/FLU/RSV plus assay is intended as an aid in the diagnosis of influenza from Nasopharyngeal swab specimens and should not be used as a sole basis for treatment. Nasal washings and aspirates are unacceptable for Xpert Xpress SARS-CoV-2/FLU/RSV testing.  Fact Sheet for Patients: EntrepreneurPulse.com.au  Fact Sheet for Healthcare Providers: IncredibleEmployment.be  This test is not yet approved or cleared by the [place] FDA and has been authorized for detection and/or diagnosis of SARS-CoV-2 by FDA under an Emergency Use Authorization (EUA). This EUA will remain in effect (meaning this test can be used) for the duration of the COVID-19 declaration under Section 564(b)(1) of the Act, 21 U.S.C. section 360bbb-3(b)(1), unless the authorization is terminated or revoked.  Performed at [facility], [address], [place], [place] [address]          Radiology Studies: CT Head Wo Contrast  Result Date: 12/23/2020 CLINICAL DATA:  Generalized weakness. EXAM: CT HEAD WITHOUT  CONTRAST TECHNIQUE: Contiguous axial images were obtained from the base of the skull through the vertex without intravenous contrast. COMPARISON:  June 25, 2018. FINDINGS: Brain: Mild diffuse cortical atrophy is noted. Mild chronic ischemic white matter disease is noted. No mass effect or midline shift is noted. Ventricular size is within normal limits. There is no evidence of mass lesion, hemorrhage or acute infarction. Vascular: No hyperdense vessel or unexpected calcification. Skull: Normal. Negative for fracture or focal lesion. Sinuses/Orbits: Right sphenoid sinusitis is noted. Other: None. IMPRESSION: Mild diffuse cortical atrophy. Mild chronic ischemic white matter disease. No acute intracranial abnormality seen. Electronically Signed   By: Marijo Conception M.D.   On: 12/23/2020 14:02   MR BRAIN WO CONTRAST  Result Date: 12/23/2020 CLINICAL DATA:  Initial evaluation for neuro deficit, stroke suspected. EXAM: MRI  HEAD WITHOUT CONTRAST TECHNIQUE: Multiplanar, multiecho pulse sequences of the brain and surrounding structures were obtained without intravenous contrast. COMPARISON:  Prior CT from earlier the same day. FINDINGS: Brain: Generalized age-related cerebral atrophy with mild chronic small vessel ischemic disease. No abnormal foci of restricted diffusion to suggest acute or subacute ischemia. Gray-white matter differentiation maintained. No encephalomalacia to suggest chronic cortical infarction. No acute or chronic intracranial hemorrhage. No mass lesion, midline shift or mass effect. No hydrocephalus or extra-axial fluid collection. Pituitary gland suprasellar region within normal limits. Midline structures intact. Vascular: Major intracranial vascular flow voids are maintained. Skull and upper cervical spine: Craniocervical junction within normal limits. Bone marrow signal intensity normal. No scalp soft tissue abnormality. Sinuses/Orbits: Patient status post bilateral ocular lens replacement.  Globes and orbital soft tissues demonstrate no acute finding. Acute right sphenoid sinusitis noted. Paranasal sinuses are otherwise clear. Small bilateral mastoid effusions noted, left greater than right. Visualized nasopharynx within normal limits. Other: None. IMPRESSION: 1. No acute intracranial abnormality. 2. Mild age-related cerebral atrophy with chronic small vessel ischemic disease. 3. Acute right sphenoid sinusitis. Electronically Signed   By: Jeannine Boga M.D.   On: 12/23/2020 21:52   DG Chest Portable 1 View  Result Date: 12/23/2020 CLINICAL DATA:  Darrick Grinder the breath, weakness EXAM: PORTABLE CHEST 1 VIEW COMPARISON:  01/03/2019 FINDINGS: Heart is normal size. Peribronchial thickening and interstitial prominence throughout the lungs. Low lung volumes. Bibasilar atelectasis. No effusions. No acute bony abnormality. Aortic atherosclerosis. IMPRESSION: Peribronchial thickening and interstitial prominence may reflect bronchitic changes or atypical infection. Bibasilar atelectasis. Electronically Signed   By: Rolm Baptise M.D.   On: 12/23/2020 12:03        Scheduled Meds: .  stroke: mapping our early stages of recovery book   Does not apply Once  . atorvastatin  10 mg Oral QPM  . citalopram  40 mg Oral Daily  . digoxin  0.125 mg Oral Daily  . donepezil  10 mg Oral QHS  . ferrous sulfate  325 mg Oral Q breakfast  . insulin aspart  0-5 Units Subcutaneous QHS  . insulin aspart  0-9 Units Subcutaneous TID WC  . [START ON 12/25/2020] insulin glargine  10 Units Subcutaneous QHS  . pantoprazole  40 mg Oral BID  . rOPINIRole  0.5 mg Oral QPM  . Warfarin - Pharmacist Dosing Inpatient   Does not apply q1600   Continuous Infusions: . sodium chloride 10 mL/hr at 12/24/20 0006     LOS: 0 days    Time spent: 76 min    Desma Maxim, MD [facility]   If 7PM-7AM, please contact night-coverage [email or web address] Password [facility] 12/24/2020, 8:26 AM

## 2020-12-24 NOTE — Evaluation (Signed)
Occupational Therapy Evaluation Patient Details Name: [name] MRN: [id number] DOB: 12-12-1940 Today's Date: 12/24/2020    History of Present Illness 80 y.o. female with medical history significant for A-fib on chronic anticoagulation therapy with Coumadin, diabetes mellitus, HTN, chronic diastolic dysfunction CHF, CKD 3/4, nicotine dependence, dementia, GERD and dyslipidemia who presents to the emergency room for evaluation of generalized weakness which he has had for about 2 weeks.  Patient states that she was transfused about 2 weeks ago and since then she has had progressively worsening weakness. Upon arrival to the ER she was noted to have significant elevated blood pressure. Imaging negative for acute infarct.   Clinical Impression   Pt was seen for OT evaluation this date. Prior to hospital admission, pt was living with her daughter in a mobile home. Pt reports that her daughter and daughter's boyfriend and has someone there 24/7. Pt reports ambulating with a rollator in the home and using a SPC when going out to doctor appointments. Pt reports completing basic ADL (bathing, dressing, toileting) without assist and daughter provides assist for ADL transfers, medication mgt, transportation, meals, and cleaning. Currently pt demonstrates impairments as described below (See OT problem list) which functionally limit her ability to perform ADL/self-care tasks. Pt currently requires CGA for ADL transfers with RW, cues for sequencing/hand placement, MOD A for LB ADL and fatigues quickly with exertion. Once in standing EOB, pt noted to have had BM. CNA notified and assisted with bed level pericare and linens changes for safety 2/2 fatigue. Pt would benefit from skilled OT services to address noted impairments and functional limitations (see below for any additional details) in order to maximize safety and independence while minimizing falls risk and caregiver burden. Upon hospital discharge, recommend  STR to maximize pt safety and return to PLOF.     Follow Up Recommendations  SNF    Equipment Recommendations  3 in 1 bedside commode    Recommendations for Other Services       Precautions / Restrictions Precautions Precautions: Fall Restrictions Weight Bearing Restrictions: No      Mobility Bed Mobility Overal bed mobility: Needs Assistance Bed Mobility: Supine to Sit;Sit to Supine;Rolling Rolling: Min assist   Supine to sit: Supervision Sit to supine: Min guard   General bed mobility comments: increased time/effort, cues for sequencing    Transfers Overall transfer level: Needs assistance Equipment used: Rolling walker (2 wheeled) Transfers: Sit to/from Stand Sit to Stand: Min guard         General transfer comment: cues for hand placement    Balance Overall balance assessment: Needs assistance Sitting-balance support: Single extremity supported;Feet supported Sitting balance-Leahy Scale: Fair     Standing balance support: Bilateral upper extremity supported Standing balance-Leahy Scale: Fair                             ADL either performed or assessed with clinical judgement   ADL Overall ADL's : Needs assistance/impaired                                       General ADL Comments: Pt requires CGA to MIN A for ADL transfers with RW, MOD A for pericare, and MOD for LB ADL tasks; set up and cues for improved access to meal trays and table top ADL 2/2 low vision     Vision  Baseline Vision/History: Legally blind;Macular Degeneration (pt reports partially blind) Patient Visual Report: No change from baseline Vision Assessment?: Vision impaired- to be further tested in functional context Additional Comments: demo's some difficulty with negotiating meal tray and does endorse difficulty seeing all items, CNA notified to support meal times     Perception     Praxis      Pertinent Vitals/Pain Pain Assessment: No/denies pain      Hand Dominance Right   Extremity/Trunk Assessment Upper Extremity Assessment Upper Extremity Assessment: Generalized weakness   Lower Extremity Assessment Lower Extremity Assessment: Generalized weakness       Communication Communication Communication: HOH   Cognition Arousal/Alertness: Awake/alert Behavior During Therapy: WFL for tasks assessed/performed Overall Cognitive Status: No family/caregiver present to determine baseline cognitive functioning                                 General Comments: per chart, hx of dementia; no family present, pt alert and oriented to self, place, generally to situation, and month but not year; follows commands with increased processing time   General Comments       Exercises Other Exercises Other Exercises: Upon standing, pt noted to have had BM and purewick malfunction, however, too weak to continue standing while staff cleaned up and prepared new linens. Returned to bed and with rolling, CNA/OT were able to support linens change and pericare in sidelying   Shoulder Instructions      Home Living Family/patient expects to be discharged to:: Private residence Living Arrangements: Children (daughter) Available Help at Discharge: Family;Available 24 hours/day Type of Home: Mobile home Home Access: Ramped entrance     Home Layout: One level     Bathroom Shower/Tub: Occupational psychologist: Standard     Home Equipment: Environmental consultant - 4 wheels;Cane - single point   Additional Comments: pt reports using rollator for mobility inside the home, [facility] to doctor appts - no family present to verify      Prior Functioning/Environment Level of Independence: Needs assistance  Gait / Transfers Assistance Needed: pt reports ambulating with rollator household distances requiring some assist for transfers and sometimes to walk, reports using [facility] for doctors appointments ADL's / Homemaking Assistance Needed: pt reports indep  with showers, dressing, toileting, and gets assist from dtr for driving, med mgt, meals, cleaning, and laundry   Comments: pt denies falls in past 12 mo        OT Problem List: Decreased cognition;Decreased strength;Decreased activity tolerance;Impaired balance (sitting and/or standing);Decreased knowledge of use of DME or AE      OT Treatment/Interventions: Self-care/ADL training;Therapeutic exercise;Therapeutic activities;Cognitive remediation/compensation;DME and/or AE instruction;Balance training;Patient/family education    OT Goals(Current goals can be found in the care plan section) Acute Rehab OT Goals Patient Stated Goal: get stronger OT Goal Formulation: With patient Time For Goal Achievement: 01/07/21 Potential to Achieve Goals: Good ADL Goals Pt Will Perform Lower Body Dressing: with min guard assist;sit to/from stand Pt Will Transfer to Toilet: with min guard assist;ambulating (LRAD for amb) Additional ADL Goal #1: Pt will perform bed mobility with modified independence in preparation for ADL transfers and seated ADL  OT Frequency: Min 1X/week   Barriers to D/C:            Co-evaluation              AM-PAC OT "6 Clicks" Daily Activity     Outcome Measure Help  from another person eating meals?: A Little Help from another person taking care of personal grooming?: A Little Help from another person toileting, which includes using toliet, bedpan, or urinal?: A Little Help from another person bathing (including washing, rinsing, drying)?: A Little Help from another person to put on and taking off regular upper body clothing?: A Little Help from another person to put on and taking off regular lower body clothing?: A Little 6 Click Score: 18   End of Session Equipment Utilized During Treatment: Gait belt;Rolling walker Nurse Communication: Other (comment);Mobility status (needs pancake or push button call bell 2/2 low vsiion)  Activity Tolerance: Patient tolerated  treatment well Patient left: in bed;with call bell/phone within reach;with bed alarm set  OT Visit Diagnosis: Other abnormalities of gait and mobility (R26.89);Muscle weakness (generalized) (M62.81)                Time: 7414-2395 OT Time Calculation (min): 38 min Charges:  OT General Charges $OT Visit: 1 Visit OT Evaluation $OT Eval Moderate Complexity: 1 Mod OT Treatments $Self Care/Home Management : 23-37 mins  Hanley Hays, MPH, MS, OTR/L ascom [phone] 12/24/20, 1:24 PM

## 2020-12-24 NOTE — Progress Notes (Signed)
*  PRELIMINARY RESULTS* Echocardiogram 2D Echocardiogram has been performed.  [name] 12/24/2020, 9:46 AM

## 2020-12-24 NOTE — Progress Notes (Signed)
SLP Cancellation Note  Patient Details Name: [name] MRN: [id number] DOB: [birth year]   Cancelled treatment:       Reason Eval/Treat Not Completed: SLP screened, no needs identified, will sign off (chart reviewed; consulted NSG then met w/ pt). Pt has baseline Dementia and vision deficits and requires support w/ setup of tray d/t overall weakness per pt presentation, report. Pt denied any difficulty swallowing and is currently on a regular diet; tolerates swallowing pills w/ water so far per NSG. Pt is Edentulous and benefits from foods cut/chopped somewhat for ease of gumming. Pt conversed w/ SLP, NSG w/out overt deficits noted; pt denied any speech-language deficits. NSG reported pt making her wants/needs known to her earlier.  No further skilled ST services indicated as pt appears at her baseline for communication. Will modify her diet to mech soft ("Soft" diet in currently per MD) d/t pt having some difficulty at Lunch meal w/ a [place] sandwich w/out Dentition. Pt agreed. Aspiration precautions including pills in Puree placed in chart/room if needed along w/ tray setup in order to support pt's self-feeding. W/ pt's overall weakness, pt may benefit from support feeding at meals. NSG updated. NSG to reconsult if any change in status while admitted.      Orinda Kenner, MS, CCC-SLP Speech Language Pathologist Rehab Services [phone] [facility] 12/24/2020, 2:20 PM

## 2020-12-24 NOTE — Evaluation (Signed)
Physical Therapy Evaluation Patient Details Name: [name] MRN: [id number] DOB: 1941-09-17 Today's Date: 12/24/2020   History of Present Illness  80 y.o. female with medical history significant for A-fib on chronic anticoagulation therapy with Coumadin, diabetes mellitus, HTN, chronic diastolic dysfunction CHF, CKD 3/4, nicotine dependence, dementia, GERD and dyslipidemia who presents to the emergency room for evaluation of generalized weakness which he has had for about 2 weeks.  Patient states that she was transfused about 2 weeks ago and since then she has had progressively worsening weakness. Upon arrival to the ER she was noted to have significant elevated blood pressure. Imaging negative for acute infarct.     Clinical Impression  Pt received in Semi-Fowler's position and agreeable to therapy.  Pt hard of hearing, but able to respond and perform commands.  Pt participated in bed-level exercises, however has difficulty with quad sets.  When performing pt tends to recruit hamstrings and calf musculature when performing QS bilaterally.  Pt given tactile cuing on the distal quads which seemed to assist in recruiting correct musculature, but could not perform well without tactile input.  All other exercises pt able to perform.  Pt able to come to sitting EOB, however needed verbal cuing for hand placement and extra time to perform.  Pt then proceeded to perform STS and was able to [place] FWW with minA from therapist and stand for approximately 1 minute.  Pt then sat down at EOB and performed 3x more.  On the final bout, pt able to take a few steps forward and backward, but again has difficulty with correct muscle recruitment causing pt to have quick posterior lean that required min-modA from therapist in order to keep pt upright.  Pt asked to transfer to chair, pt responded well with much improved and fluid movement pattern to get to chair.  Pt left with all needs met and call bell on bed within  reach.  Nursing notified of no handheld call bell and mobility status.  Pt will benefit from skilled PT intervention to increase independence and safety with basic mobility in preparation for discharge to the venue listed below.         Follow Up Recommendations SNF;Supervision/Assistance - 24 hour    Equipment Recommendations  Rolling walker with 5" wheels    Recommendations for Other Services       Precautions / Restrictions Precautions Precautions: Fall Restrictions Weight Bearing Restrictions: No      Mobility  Bed Mobility Overal bed mobility: Needs Assistance Bed Mobility: Supine to Sit;Sit to Supine;Rolling Rolling: Min assist   Supine to sit: Supervision Sit to supine: Min guard   General bed mobility comments: increased time/effort, cues for sequencing    Transfers Overall transfer level: Needs assistance Equipment used: Rolling walker (2 wheeled) Transfers: Sit to/from [facility] Sit to Stand: Min guard Stand pivot transfers: Min guard       General transfer comment: cues for hand placement, minA for mobility to the chair  Ambulation/Gait Ambulation/Gait assistance: Min assist Gait Distance (Feet): 4 Feet Assistive device: Rolling walker (2 wheeled) Gait Pattern/deviations: Step-to pattern;Steppage Gait velocity: decreased   General Gait Details: Pt exhibited tremors when ambulating forward/backward.  Upon first step, pt required minA to remain upright because her tremors caused herself to push backwards towards bed.  Stairs            Wheelchair Mobility    Modified Rankin (Stroke Patients Only)       Balance Overall balance assessment:  Needs assistance Sitting-balance support: Single extremity supported;Feet supported Sitting balance-Leahy Scale: Fair     Standing balance support: Bilateral upper extremity supported Standing balance-Leahy Scale: Fair                               Pertinent  Vitals/Pain Pain Assessment: No/denies pain    Home Living Family/patient expects to be discharged to:: Private residence Living Arrangements: Children (daughter) Available Help at Discharge: Family;Available 24 hours/day Type of Home: Mobile home Home Access: Ramped entrance     Home Layout: One level Home Equipment: [place] - 4 wheels;Cane - single point Additional Comments: Pt reports using rollator for mobility inside the home, [facility] to doctor appts - no family present to verify    Prior Function Level of Independence: Needs assistance   Gait / Transfers Assistance Needed: pt reports ambulating with rollator household distances requiring some assist for transfers and sometimes to walk, reports using [facility] for doctors appointments  ADL's / Homemaking Assistance Needed: pt reports indep with showers, dressing, toileting, and gets assist from dtr for driving, med mgt, meals, cleaning, and laundry  Comments: pt denies falls in past 12 mo     Hand Dominance   Dominant Hand: Right    Extremity/Trunk Assessment   Upper Extremity Assessment Upper Extremity Assessment: Generalized weakness    Lower Extremity Assessment Lower Extremity Assessment: Generalized weakness       Communication   Communication: HOH  Cognition Arousal/Alertness: Awake/alert Behavior During Therapy: WFL for tasks assessed/performed Overall Cognitive Status: No family/caregiver present to determine baseline cognitive functioning                                 General Comments: per chart, hx of dementia; no family present, pt alert and oriented to self, place, generally to situation, and month but not year; follows commands with increased processing time      General Comments      Exercises Total Joint Exercises Ankle Circles/Pumps: AROM;Strengthening;Both;10 reps;Supine Quad Sets: AROM;Strengthening;Both;10 reps;Supine Gluteal Sets: AROM;Strengthening;Both;10 reps;Supine Hip  ABduction/ADduction: AROM;Strengthening;Both;10 reps;Supine Straight Leg Raises: AROM;Strengthening;Both;10 reps;Supine Long Arc Quad: AROM;Strengthening;Both;10 reps;Seated Other Exercises Other Exercises: Upon standing, pt noted to have had BM and purewick malfunction, however, too weak to continue standing while staff cleaned up and prepared new linens. Returned to bed and with rolling, CNA/OT were able to support linens change and pericare in sidelying Other Exercises: Pt educated on role of PT and services provided during hospital stay.   Assessment/Plan    PT Assessment Patient needs continued PT services  PT Problem List Decreased strength;Decreased activity tolerance;Decreased balance;Decreased mobility;Decreased coordination;Decreased cognition;Decreased knowledge of use of DME;Decreased safety awareness       PT Treatment Interventions DME instruction;Gait training;Functional mobility training;Therapeutic activities;Therapeutic exercise;Balance training;Neuromuscular re-education    PT Goals (Current goals can be found in the Care Plan section)  Acute Rehab PT Goals Patient Stated Goal: get stronger PT Goal Formulation: With patient Time For Goal Achievement: 01/07/21 Potential to Achieve Goals: Fair    Frequency Min 2X/week   Barriers to discharge Decreased caregiver support      Co-evaluation               AM-PAC PT "6 Clicks" Mobility  Outcome Measure Help needed turning from your back to your side while in a flat bed without using bedrails?: A Little Help needed moving from  lying on your back to sitting on the side of a flat bed without using bedrails?: A Little Help needed moving to and from a bed to a chair (including a wheelchair)?: A Little Help needed standing up from a chair using your arms (e.g., wheelchair or bedside chair)?: A Little Help needed to walk in hospital room?: A Lot Help needed climbing 3-5 steps with a railing? : Total 6 Click Score:  15    End of Session Equipment Utilized During Treatment: Gait belt Activity Tolerance: Patient tolerated treatment well Patient left: in chair;with call bell/phone within reach;with chair alarm set Nurse Communication: Mobility status;Other (comment) (No hand held call bell in room, nursing notified.  Pt was left within arms reach of the call bell that is on the bedrail.) PT Visit Diagnosis: Unsteadiness on feet (R26.81);Other abnormalities of gait and mobility (R26.89);Muscle weakness (generalized) (M62.81);Difficulty in walking, not elsewhere classified (R26.2)    Time: 4037-5436 PT Time Calculation (min) (ACUTE ONLY): 46 min   Charges:   PT Evaluation $PT Eval Low Complexity: 1 Low PT Treatments $Therapeutic Exercise: 23-37 mins $Therapeutic Activity: 23-37 mins        Gwenlyn Saran, PT, DPT 12/24/20, 2:08 PM

## 2020-12-24 NOTE — Consult Note (Addendum)
[place] for Warfarin Indication: atrial fibrillation  Patient Measurements: Height: 4\' 10"  (147.3 cm) Weight: 59 kg (130 lb) IBW/kg (Calculated) : 40.9  Labs: Recent Labs    12/23/20 1040 12/23/20 1124 12/23/20 1252 12/23/20 1507 12/23/20 1642 12/24/20 0456  HGB 8.7*  --   --   --   --  9.1*  HCT 28.8*  --   --   --   --  29.4*  PLT 221  --   --   --   --  215  LABPROT  --   --   --  16.8*  --  16.5*  INR  --   --   --  1.4*  --  1.4*  CREATININE 2.02*  --   --   --   --   --   TROPONINIHS  --    < > 18* 19* 18*  --    < > = values in this interval not displayed.    Estimated Creatinine Clearance: 17.1 mL/min (A) (by C-G formula based on SCr of 2.02 mg/dL (H)).   Medications:  Warfarin 2mg  QD PTA No other APT  DDIs: APAP prn, Celexa & tramadol, ropinirole, digoxin  Assessment: 80 y.o. female PMH of Afib (on coumadin INR 2-3), DM, HTN, diaCHF, nicotine dependence, GERD and HLD presenting with generalized weakness x2 wks. Pharmacy consulted to manage warfarin while inpatient.  Baseline:  Hgb 8.7 (anemia of CKD) ; Plts 221  Date INR Dose/Comment 3/03 1.4 Warfarin 3 mg 3/04 1.4 Warfarin 3 mg  Goal of Therapy:  INR 2-3   Plan:  --INR is subtherapeutic. Will give warfarin 3 mg (1.5x home dose) again tonight --Daily INR per protocol --CBC at least every 3 days per protocol  Benita Gutter 12/24/2020,9:27 AM

## 2020-12-25 DIAGNOSIS — I160 Hypertensive urgency: Secondary | ICD-10-CM | POA: Diagnosis not present

## 2020-12-25 LAB — CBC
HCT: 30.6 % — ABNORMAL LOW (ref 36.0–46.0)
Hemoglobin: 9.3 g/dL — ABNORMAL LOW (ref 12.0–15.0)
MCH: 24.5 pg — ABNORMAL LOW (ref 26.0–34.0)
MCHC: 30.4 g/dL (ref 30.0–36.0)
MCV: 80.5 fL (ref 80.0–100.0)
Platelets: 218 10*3/uL (ref 150–400)
RBC: 3.8 MIL/uL — ABNORMAL LOW (ref 3.87–5.11)
RDW: 22.4 % — ABNORMAL HIGH (ref 11.5–15.5)
WBC: 8 10*3/uL (ref 4.0–10.5)
nRBC: 0 % (ref 0.0–0.2)

## 2020-12-25 LAB — FOLATE: Folate: 10.9 ng/mL (ref >5.9)

## 2020-12-25 LAB — GLUCOSE, CAPILLARY
Glucose-Capillary: 135 mg/dL — ABNORMAL HIGH (ref 70–99)
Glucose-Capillary: 163 mg/dL — ABNORMAL HIGH (ref 70–99)
Glucose-Capillary: 118 mg/dL — ABNORMAL HIGH (ref 70–99)
Glucose-Capillary: 122 mg/dL — ABNORMAL HIGH (ref 70–99)

## 2020-12-25 LAB — BASIC METABOLIC PANEL
Anion gap: 6 (ref 5–15)
BUN: 19 mg/dL (ref 8–23)
CO2: 24 mmol/L (ref 22–32)
Calcium: 8.4 mg/dL — ABNORMAL LOW (ref 8.9–10.3)
Chloride: 109 mmol/L (ref 98–111)
Creatinine, Ser: 1.95 mg/dL — ABNORMAL HIGH (ref 0.44–1.00)
GFR, Estimated: 26 mL/min — ABNORMAL LOW (ref >60)
Glucose, Bld: 133 mg/dL — ABNORMAL HIGH (ref 70–99)
Potassium: 4.2 mmol/L (ref 3.5–5.1)
Sodium: 139 mmol/L (ref 135–145)

## 2020-12-25 LAB — PROTIME-INR
INR: 1.5 — ABNORMAL HIGH (ref 0.8–1.2)
Prothrombin Time: 17.9 seconds — ABNORMAL HIGH (ref 11.4–15.2)

## 2020-12-25 LAB — URINE CULTURE

## 2020-12-25 LAB — VITAMIN B12: Vitamin B-12: 151 pg/mL — ABNORMAL LOW (ref 180–914)

## 2020-12-25 LAB — DIGOXIN LEVEL: Digoxin Level: 1.5 ng/mL (ref 0.8–2.0)

## 2020-12-25 MED ORDER — VITAMIN B-12 1000 MCG PO TABS
1000.0000 ug | ORAL_TABLET | Freq: Every day | ORAL | Status: DC
  Administered 2020-12-25 – 2020-12-29 (×5): 1000 ug via ORAL
  Filled 2020-12-25 (×5): qty 1

## 2020-12-25 MED ORDER — WARFARIN SODIUM 2.5 MG PO TABS
3.5000 mg | ORAL_TABLET | Freq: Once | ORAL | Status: AC
  Administered 2020-12-25: 3.5 mg via ORAL
  Filled 2020-12-25: qty 1

## 2020-12-25 MED ORDER — LISINOPRIL 10 MG PO TABS
10.0000 mg | ORAL_TABLET | Freq: Every day | ORAL | Status: DC
  Administered 2020-12-25 – 2020-12-26 (×2): 10 mg via ORAL
  Filled 2020-12-25 (×2): qty 1

## 2020-12-25 NOTE — NC FL2 (Signed)
[place] LEVEL OF CARE SCREENING TOOL     IDENTIFICATION  Patient Name: [name] Birthdate: 10-22-1941 Sex: female Admission Date (Current Location): 12/23/2020  [place] and [place] Number:  Engineering geologist and Address:  [facility], [address], [place], [place] [address]      Provider Number: [id number]  Attending Physician Name and Address:  Gwynne Edinger, MD  Relative Name and Phone Number:  June Lovena Le [phone]    Current Level of Care: Hospital Recommended Level of Care: [place] Prior Approval Number:    Date Approved/Denied:   PASRR Number: [id number]  Discharge Plan: SNF    Current Diagnoses: Patient Active Problem List   Diagnosis Date Noted  . Weakness 12/24/2020  . Hypertensive urgency 12/23/2020  . Nicotine dependence 12/23/2020  . History of anemia due to chronic kidney disease 12/23/2020  . Pain due to onychomycosis of toenails of both feet 02/12/2020  . Uncontrolled type 2 diabetes mellitus with hyperglycemia ([place]) 05/20/2019  . Acute recurrent pansinusitis 05/11/2019  . Acute otitis media 05/11/2019  . Acute pain of left shoulder 05/11/2019  . Long term (current) use of anticoagulants 04/03/2019  . Chronic left shoulder pain 03/18/2019  . Sepsis ([place]) 01/12/2018  . Colitis 01/12/2018  . CKD (chronic kidney disease), stage III ([place]) 01/12/2018  . Diabetes ([place]) 12/28/2017  . AF (paroxysmal atrial fibrillation) ([place]) 12/28/2017  . Encounter for general adult medical examination with abnormal findings 12/28/2017  . Primary generalized (osteo)arthritis 12/28/2017  . Hypertension 12/27/2017  . GERD (gastroesophageal reflux disease) 12/27/2017  . Hematuria 03/18/2015  . Chronic combined systolic and diastolic heart failure ([place]) 03/01/2012  . Obesity 03/01/2012  . CKD stage 4 due to type 2 diabetes mellitus ([place]) 03/01/2012  . Other benign neoplasm of connective and other  soft tissue of lower limb, including hip 03/15/2011  . Neoplasm of connective tissue 02/01/2011  . Pain in joint, pelvic region and thigh 02/01/2011  . Type II diabetes mellitus ([place]) 10/03/2006  . Encounter for current long-term use of anticoagulants 02/07/2006  . Essential hypertension 09/24/2004  . Atrial fibrillation ([place]) 06/07/2004    Orientation RESPIRATION BLADDER Height & Weight     Self,Time,Situation  Normal Continent Weight: 51.9 kg Height:  4\' 10"  (147.3 cm)  BEHAVIORAL SYMPTOMS/MOOD NEUROLOGICAL BOWEL NUTRITION STATUS      Continent Diet  AMBULATORY STATUS COMMUNICATION OF NEEDS Skin   Extensive Assist Verbally Normal                       Personal Care Assistance Level of Assistance  Bathing,Feeding,Dressing Bathing Assistance: Limited assistance Feeding assistance: Limited assistance Dressing Assistance: Limited assistance     Functional Limitations Info  Sight,Hearing,Speech Sight Info: Impaired (Legally BLind) Hearing Info: Impaired (Hard Of hearing.) Speech Info: Adequate    SPECIAL CARE FACTORS FREQUENCY  PT (By licensed PT),OT (By licensed OT)     PT Frequency: 5x week OT Frequency: 5x week            Contractures Contractures Info: Not present    Additional Factors Info  Code Status,Allergies Code Status Info: DNR Allergies Info: None           Current Medications (12/25/2020):  This is the current hospital active medication list Current Facility-Administered Medications  Medication Dose Route Frequency Provider Last Rate Last Admin  .  stroke: mapping our early stages of recovery book   Does not apply Once Agbata, Tochukwu,  MD      . 0.9 %  sodium chloride infusion   Intravenous Continuous Agbata, Tochukwu, MD 10 mL/hr at 12/24/20 0006 New Bag at 12/24/20 0006  . acetaminophen (TYLENOL) tablet 650 mg  650 mg Oral Q4H PRN Agbata, Tochukwu, MD   650 mg at 12/24/20 0859   Or  . acetaminophen (TYLENOL) 160 MG/5ML solution 650 mg  650  mg Per Tube Q4H PRN Agbata, Tochukwu, MD       Or  . acetaminophen (TYLENOL) suppository 650 mg  650 mg Rectal Q4H PRN Agbata, Tochukwu, MD      . atorvastatin (LIPITOR) tablet 10 mg  10 mg Oral QPM Agbata, Tochukwu, MD   10 mg at 12/24/20 1653  . citalopram (CELEXA) tablet 40 mg  40 mg Oral Daily Agbata, Tochukwu, MD   40 mg at 12/25/20 0956  . digoxin (LANOXIN) tablet 0.125 mg  0.125 mg Oral Daily Agbata, Tochukwu, MD   0.125 mg at 12/25/20 0956  . docusate sodium (COLACE) capsule 200 mg  200 mg Oral Daily Gwynne Edinger, MD   200 mg at 12/25/20 3329  . donepezil (ARICEPT) tablet 10 mg  10 mg Oral QHS Agbata, Tochukwu, MD   10 mg at 12/24/20 2226  . ferrous sulfate tablet 325 mg  325 mg Oral Q breakfast Agbata, Tochukwu, MD   325 mg at 12/25/20 0956  . insulin aspart (novoLOG) injection 0-5 Units  0-5 Units Subcutaneous QHS Ouma, Bing Neighbors, NP      . insulin glargine (LANTUS) injection 6 Units  6 Units Subcutaneous QHS Wouk, Ailene Rud, MD      . labetalol (NORMODYNE) injection 10 mg  10 mg Intravenous Q6H PRN Agbata, Tochukwu, MD      . lisinopril (ZESTRIL) tablet 10 mg  10 mg Oral Daily Gwynne Edinger, MD   10 mg at 12/25/20 [phone]  . pantoprazole (PROTONIX) EC tablet 40 mg  40 mg Oral BID Agbata, Tochukwu, MD   40 mg at 12/25/20 0956  . rOPINIRole (REQUIP) tablet 0.5 mg  0.5 mg Oral QPM Agbata, Tochukwu, MD   0.5 mg at 12/24/20 1652  . vitamin B-12 (CYANOCOBALAMIN) tablet 1,000 mcg  1,000 mcg Oral Daily Wouk, Ailene Rud, MD      . Warfarin - Pharmacist Dosing Inpatient   Does not apply q1600 Lorna Dibble, [facility]   Given at 12/24/20 1654     Discharge Medications: Please see discharge summary for a list of discharge medications.  Relevant Imaging Results:  Relevant Lab Results:   Additional Information    Kerin Salen, RN

## 2020-12-25 NOTE — TOC Progression Note (Signed)
Transition of Care [facility]) - Progression Note    Patient Details  Name: [name] MRN: [id number] Date of Birth: 1941/03/06  [facility] CM/SW [place], RN Phone Number: 12/25/2020, 11:02 AM  Clinical Narrative:  Spoke with patients sister, [name] who voices approval of SNF, says that she spoke with the Doctor and her sister and they all agree this will be best for now. June states she prefers [facility] which is close to her home. I did advise that [facility] will be [facility] choice, however more SNF's will be selected due to a chance that [facility] may not have a bed, sister voices understanding.          Expected Discharge Plan and Services                                                 Social Determinants of Health (SDOH) Interventions    Readmission Risk Interventions No flowsheet data found.

## 2020-12-25 NOTE — Consult Note (Signed)
[facility] for Warfarin Indication: atrial fibrillation  Patient Measurements: Height: 4\' 10"  (147.3 cm) Weight: 51.9 kg (114 lb 6.4 oz) IBW/kg (Calculated) : 40.9  Labs: Recent Labs    12/23/20 1040 12/23/20 1124 12/23/20 1252 12/23/20 1507 12/23/20 1642 12/24/20 0456 12/24/20 0841 12/25/20 0510  HGB 8.7*  --   --   --   --  9.1*  --  9.3*  HCT 28.8*  --   --   --   --  29.4*  --  30.6*  PLT 221  --   --   --   --  215  --  218  LABPROT  --   --   --  16.8*  --  16.5*  --  17.9*  INR  --   --   --  1.4*  --  1.4*  --  1.5*  CREATININE 2.02*  --   --   --   --   --  1.84* 1.95*  CKTOTAL  --   --   --   --   --   --  52  --   TROPONINIHS  --    < > 18* 19* 18*  --   --   --    < > = values in this interval not displayed.    Estimated Creatinine Clearance: 16.7 mL/min (A) (by C-G formula based on SCr of 1.95 mg/dL (H)).   Medications:  Warfarin 2mg  QD PTA No other APT  DDIs: APAP prn, Celexa & tramadol, ropinirole, digoxin  Assessment: 80 y.o. female PMH of Afib (on coumadin INR 2-3), DM, HTN, diaCHF, nicotine dependence, GERD and HLD presenting with generalized weakness x2 wks. Pharmacy consulted to manage warfarin while inpatient.  Baseline:  Hgb 8.7 (anemia of CKD) ; Plts 221   Date INR Dose/Comment 3/03 1.4 Warfarin 3 mg 3/04 1.4 Warfarin 3 mg 3/05 1.5    DDI: Warfarin/Ropinirole  Goal of Therapy:  INR 2-3   Plan:  --INR is subtherapeutic. Will give warfarin 3.5 mg tonight --Daily INR per protocol --CBC at least every 3 days per protocol  Safiyah Cisney A 12/25/2020,11:10 AM

## 2020-12-25 NOTE — Progress Notes (Signed)
PROGRESS NOTE    [name]  BWG:[id number] DOB: 1941/06/12 DOA: 12/23/2020 PCP: Amy Guise, MD      Brief Narrative:   : [name] is a 80 y.o. female with medical history significant for atrial fibrillation on chronic anticoagulation therapy with Coumadin, diabetes mellitus, hypertension, chronic diastolic dysfunction CHF, nicotine dependence, GERD and dyslipidemia who presents to the emergency room for evaluation of generalized weakness which he has had for about 2 weeks.  Patient states that she was transfused about 2 weeks ago and since then she has had progressively worsening weakness.  She denies having any falls and denies feeling dizzy or lightheaded.  EMS was called today because she was unable to get up due to the severity of the weakness.  She is unable to tell me the color of her stools but denies having any hematemesis or hematuria.  She denies any NSAID use.  She complains of a headache but denies having any blurred vision or difficulty swallowing.  She has no focal deficits and is able to move all extremities. She denies having any chest pain, no shortness of breath, no nausea, no vomiting, no palpitations, no diaphoresis, no urinary frequency, no nocturia, no dysuria, no abdominal pain, no constipation, no diarrhea, no cough, no fever or chills.   Assessment & Plan:   Principal Problem:   Hypertensive urgency Active Problems:   GERD (gastroesophageal reflux disease)   Chronic combined systolic and diastolic heart failure (HCC)   Atrial fibrillation (HCC)   CKD stage 4 due to type 2 diabetes mellitus (HCC)   Nicotine dependence   History of anemia due to chronic kidney disease   Weakness  # Generalized weakness and fatigue # Debility Several week history of this. Nothing focal. Recent blood transfusion for iron deficiency anemia thought to be 2/2 ckd, hgb is improved (9s) and stable. No electrolyte abnormalities. CT/MRI of head w/o acute findings. CXR nothing  acute. Non-focal exam. Suspect this is dementia-related decline. ua not strongly suggestive of infection. b12 is low. Ck, lfts, tsh wnl. TTE wnl - f/u u cult - PT/OT advising snf, pt and daughter agreeable, care mgmt pursuing  # B12 deficiency - start b12 - f/u folate  # Hypertensive urgency CT/MRI neg. Kidney function at baseline. BPs remain elevated - have started amlodipine, increased to 10 - add lisinopril 10 - labetalol prn - home statin  # Chronic atrial fibrillation Rate controlled on digoxin which is therapeutic. INR subtherapeutic - cont home digoxin, warfarin per pharmacy consult - tele  # Dementia # MDD - cont home aricept, celexa  # T2DM Here glucose wnl - cont home lantus 6, monitor fasting sugars  # Restless leg - cont requip  # Iron deficiency anemia Recent blood transfusion. Hgb stable. No report of bleeding/melena. - cont warfarin - monitor - outpt f/u assuming hgb remains stable  # CKD 3/4 Cr 2 which is stable - monitor    DVT prophylaxis: home warfarin Code Status: dnr Family Communication: daughter updated telephonically 3/4  Level of care: Med-Surg Status is: inpt  The patient will require care spanning > 2 midnights and should be moved to inpatient because: Inpatient level of care appropriate due to severity of illness  Dispo: The patient is from: Home              Anticipated d/c is to: tbd              Patient currently is not medically stable to d/c.  Difficult to place patient No        Consultants:  none  Procedures: none  Antimicrobials:  none    Subjective: This morning no complaints. Has appetite. No chest pain or cough. No abd pain. No n/v.  Objective: Vitals:   12/24/20 2217 12/25/20 0409 12/25/20 0524 12/25/20 0748  BP: (!) 190/59  (!) 191/61 (!) 193/61  Pulse: 73  69 70  Resp:   16 18  Temp: 97.9 F (36.6 C)  98 F (36.7 C) 98.2 F (36.8 C)  TempSrc: Oral  Oral Oral  SpO2: 99%  96% 98%  Weight:   51.9 kg    Height:        Intake/Output Summary (Last 24 hours) at 12/25/2020 0922 Last data filed at 12/25/2020 0747 Gross per 24 hour  Intake 490 ml  Output 950 ml  Net -460 ml   Filed Weights   12/23/20 1034 12/25/20 0409  Weight: 59 kg 51.9 kg    Examination:  General exam: Appears calm and comfortable. adentous Respiratory system: few scattered rhonchi. Respiratory effort normal. Cardiovascular system: irreg irreg, faint systolic murmur Gastrointestinal system: Abdomen is nondistended, soft and nontender. No organomegaly or masses felt. Normal bowel sounds heard. Central nervous system: Alert and oriented. No focal neurological deficits. Extremities: Symmetric 5 x 5 power. Skin: No rashes, lesions or ulcers Psychiatry: Judgement and insight appear normal. Mood & affect appropriate.     Data Reviewed: I have personally reviewed following labs and imaging studies  CBC: Recent Labs  Lab 12/23/20 1040 12/24/20 0456 12/25/20 0510  WBC 6.8 8.0 8.0  HGB 8.7* 9.1* 9.3*  HCT 28.8* 29.4* 30.6*  MCV 80.9 79.7* 80.5  PLT 221 215 811   Basic Metabolic Panel: Recent Labs  Lab 12/23/20 1040 12/24/20 0841 12/25/20 0510  NA 137 140 139  K 4.1 4.2 4.2  CL 108 110 109  CO2 22 22 24   GLUCOSE 187* 115* 133*  BUN 17 17 19   CREATININE 2.02* 1.84* 1.95*  CALCIUM 8.3* 8.1* 8.4*   GFR: Estimated Creatinine Clearance: 16.7 mL/min (A) (by C-G formula based on SCr of 1.95 mg/dL (H)). Liver Function Tests: Recent Labs  Lab 12/24/20 0841  AST 15  ALT 9  ALKPHOS 79  BILITOT 0.9  PROT 6.1*  ALBUMIN 2.2*   No results for input(s): LIPASE, AMYLASE in the last 168 hours. No results for input(s): AMMONIA in the last 168 hours. Coagulation Profile: Recent Labs  Lab 12/23/20 1507 12/24/20 0456 12/25/20 0510  INR 1.4* 1.4* 1.5*   Cardiac Enzymes: Recent Labs  Lab 12/24/20 0841  CKTOTAL 52   BNP (last 3 results) No results for input(s): PROBNP in the last 8760  hours. HbA1C: Recent Labs    12/24/20 0456  HGBA1C 6.9*   CBG: Recent Labs  Lab 12/24/20 0735 12/24/20 1203 12/24/20 1628 12/24/20 2040 12/25/20 0745  GLUCAP 118* 192* 170* 171* 135*   Lipid Profile: Recent Labs    12/24/20 0456  CHOL 97  HDL 22*  LDLCALC 44  TRIG 153*  CHOLHDL 4.4   Thyroid Function Tests: Recent Labs    12/24/20 0841  TSH 2.913   Anemia Panel: Recent Labs    12/24/20 0841  VITAMINB12 151*   Urine analysis:    Component Value Date/Time   COLORURINE YELLOW (A) 12/23/2020 1040   APPEARANCEUR HAZY (A) 12/23/2020 1040   APPEARANCEUR Cloudy (A) 03/24/2020 0000   LABSPEC 1.013 12/23/2020 1040   PHURINE 6.0 12/23/2020 1040  GLUCOSEU 150 (A) 12/23/2020 1040   HGBUR MODERATE (A) 12/23/2020 1040   BILIRUBINUR NEGATIVE 12/23/2020 1040   BILIRUBINUR Negative 03/24/2020 0000   KETONESUR NEGATIVE 12/23/2020 1040   PROTEINUR >=300 (A) 12/23/2020 1040   UROBILINOGEN 0.2 09/02/2019 1441   NITRITE NEGATIVE 12/23/2020 1040   LEUKOCYTESUR NEGATIVE 12/23/2020 1040   Sepsis Labs: @LABRCNTIP (procalcitonin:4,lacticidven:4)  ) Recent Results (from the past 240 hour(s))  Resp Panel by RT-PCR (Flu A&B, Covid) Nasopharyngeal Swab     Status: None   Collection Time: 12/23/20 12:52 PM   Specimen: Nasopharyngeal Swab; Nasopharyngeal(NP) swabs in vial transport medium  Result Value Ref Range Status   SARS Coronavirus 2 by RT PCR NEGATIVE NEGATIVE Final    Comment: (NOTE) SARS-CoV-2 target nucleic acids are NOT DETECTED.  The SARS-CoV-2 RNA is generally detectable in upper respiratory specimens during the acute phase of infection. The lowest concentration of SARS-CoV-2 viral copies this assay can detect is 138 copies/mL. A negative result does not preclude SARS-Cov-2 infection and should not be used as the sole basis for treatment or other patient management decisions. A negative result may occur with  improper specimen collection/handling, submission of  specimen other than nasopharyngeal swab, presence of viral mutation(s) within the areas targeted by this assay, and inadequate number of viral copies(<138 copies/mL). A negative result must be combined with clinical observations, patient history, and epidemiological information. The expected result is Negative.  Fact Sheet for Patients:  EntrepreneurPulse.com.au  Fact Sheet for Healthcare Providers:  IncredibleEmployment.be  This test is no t yet approved or cleared by the [place] FDA and  has been authorized for detection and/or diagnosis of SARS-CoV-2 by FDA under an Emergency Use Authorization (EUA). This EUA will remain  in effect (meaning this test can be used) for the duration of the COVID-19 declaration under Section 564(b)(1) of the Act, 21 U.S.C.section 360bbb-3(b)(1), unless the authorization is terminated  or revoked sooner.       Influenza A by PCR NEGATIVE NEGATIVE Final   Influenza B by PCR NEGATIVE NEGATIVE Final    Comment: (NOTE) The Xpert Xpress SARS-CoV-2/FLU/RSV plus assay is intended as an aid in the diagnosis of influenza from Nasopharyngeal swab specimens and should not be used as a sole basis for treatment. Nasal washings and aspirates are unacceptable for Xpert Xpress SARS-CoV-2/FLU/RSV testing.  Fact Sheet for Patients: EntrepreneurPulse.com.au  Fact Sheet for Healthcare Providers: IncredibleEmployment.be  This test is not yet approved or cleared by the [place] FDA and has been authorized for detection and/or diagnosis of SARS-CoV-2 by FDA under an Emergency Use Authorization (EUA). This EUA will remain in effect (meaning this test can be used) for the duration of the COVID-19 declaration under Section 564(b)(1) of the Act, 21 U.S.C. section 360bbb-3(b)(1), unless the authorization is terminated or revoked.  Performed at [facility], [address], [place], [place] [address]          Radiology Studies: CT Head Wo Contrast  Result Date: 12/23/2020 CLINICAL DATA:  Generalized weakness. EXAM: CT HEAD WITHOUT CONTRAST TECHNIQUE: Contiguous axial images were obtained from the base of the skull through the vertex without intravenous contrast. COMPARISON:  June 25, 2018. FINDINGS: Brain: Mild diffuse cortical atrophy is noted. Mild chronic ischemic white matter disease is noted. No mass effect or midline shift is noted. Ventricular size is within normal limits. There is no evidence of mass lesion, hemorrhage or acute infarction. Vascular: No hyperdense vessel or unexpected calcification. Skull: Normal. Negative for fracture or focal  lesion. Sinuses/Orbits: Right sphenoid sinusitis is noted. Other: None. IMPRESSION: Mild diffuse cortical atrophy. Mild chronic ischemic white matter disease. No acute intracranial abnormality seen. Electronically Signed   By: Marijo Conception M.D.   On: 12/23/2020 14:02   MR BRAIN WO CONTRAST  Result Date: 12/23/2020 CLINICAL DATA:  Initial evaluation for neuro deficit, stroke suspected. EXAM: MRI HEAD WITHOUT CONTRAST TECHNIQUE: Multiplanar, multiecho pulse sequences of the brain and surrounding structures were obtained without intravenous contrast. COMPARISON:  Prior CT from earlier the same day. FINDINGS: Brain: Generalized age-related cerebral atrophy with mild chronic small vessel ischemic disease. No abnormal foci of restricted diffusion to suggest acute or subacute ischemia. Gray-white matter differentiation maintained. No encephalomalacia to suggest chronic cortical infarction. No acute or chronic intracranial hemorrhage. No mass lesion, midline shift or mass effect. No hydrocephalus or extra-axial fluid collection. Pituitary gland suprasellar region within normal limits. Midline structures intact. Vascular: Major intracranial vascular flow voids are maintained. Skull and upper cervical spine: Craniocervical  junction within normal limits. Bone marrow signal intensity normal. No scalp soft tissue abnormality. Sinuses/Orbits: Patient status post bilateral ocular lens replacement. Globes and orbital soft tissues demonstrate no acute finding. Acute right sphenoid sinusitis noted. Paranasal sinuses are otherwise clear. Small bilateral mastoid effusions noted, left greater than right. Visualized nasopharynx within normal limits. Other: None. IMPRESSION: 1. No acute intracranial abnormality. 2. Mild age-related cerebral atrophy with chronic small vessel ischemic disease. 3. Acute right sphenoid sinusitis. Electronically Signed   By: Jeannine Boga M.D.   On: 12/23/2020 21:52   DG Chest Portable 1 View  Result Date: 12/23/2020 CLINICAL DATA:  [name] the breath, weakness EXAM: PORTABLE CHEST 1 VIEW COMPARISON:  01/03/2019 FINDINGS: Heart is normal size. Peribronchial thickening and interstitial prominence throughout the lungs. Low lung volumes. Bibasilar atelectasis. No effusions. No acute bony abnormality. Aortic atherosclerosis. IMPRESSION: Peribronchial thickening and interstitial prominence may reflect bronchitic changes or atypical infection. Bibasilar atelectasis. Electronically Signed   By: Rolm Baptise M.D.   On: 12/23/2020 12:03   ECHOCARDIOGRAM COMPLETE  Result Date: 12/24/2020    ECHOCARDIOGRAM REPORT   Patient Name:   [name] Date of Exam: 12/24/2020 Medical Rec #:  [id number]      Height:       58.0 in Accession #:    [id number]     Weight:       130.0 lb Date of Birth:  30-Dec-1940      BSA:          1.516 m Patient Age:    29 years       BP:           185/65 mmHg Patient Gender: F              HR:           72 bpm. Exam Location:  [facility] Procedure: 2D Echo, Cardiac Doppler and Color Doppler Indications:     Cardiomyopathy-- Hypertrophic I42.1  History:         Patient has prior history of Echocardiogram examinations, most                  recent 05/11/2020. Arrythmias:Atrial Fibrillation; Risk                   Factors:Hypertension, Dyslipidemia and Diabetes.  Sonographer:     Sherrie Sport RDCS (AE) Referring Phys:  [id number] Collier Bullock Diagnosing Phys: Ida Rogue MD IMPRESSIONS  1. Left ventricular ejection fraction, by estimation, is 60  to 65%. The left ventricle has normal function. The left ventricle has no regional wall motion abnormalities. Left ventricular diastolic parameters are indeterminate.  2. Right ventricular systolic function is normal. The right ventricular size is normal. There is normal pulmonary artery systolic pressure.  3. Left atrial size was mildly dilated.  4. There is moderate calcification of the aortic valve. Aortic valve regurgitation is mild. Mild aortic valve stenosis.  5. The inferior vena cava is dilated in size with <50% respiratory variability, suggesting right atrial pressure of 15 mmHg.  6. Left pleural effusion noted. FINDINGS  Left Ventricle: Left ventricular ejection fraction, by estimation, is 60 to 65%. The left ventricle has normal function. The left ventricle has no regional wall motion abnormalities. The left ventricular internal cavity size was normal in size. There is  no left ventricular hypertrophy. Left ventricular diastolic parameters are indeterminate. Right Ventricle: The right ventricular size is normal. No increase in right ventricular wall thickness. Right ventricular systolic function is normal. There is normal pulmonary artery systolic pressure. The tricuspid regurgitant velocity is 1.75 m/s, and  with an assumed right atrial pressure of 10 mmHg, the estimated right ventricular systolic pressure is 76.1 mmHg. Left Atrium: Left atrial size was mildly dilated. Right Atrium: Right atrial size was normal in size. Pericardium: There is no evidence of pericardial effusion. Mitral Valve: The mitral valve is normal in structure. Mild mitral annular calcification. No evidence of mitral valve regurgitation. No evidence of mitral valve stenosis. Tricuspid Valve:  The tricuspid valve is normal in structure. Tricuspid valve regurgitation is not demonstrated. No evidence of tricuspid stenosis. Aortic Valve: The aortic valve was not well visualized. There is moderate calcification of the aortic valve. Aortic valve regurgitation is mild. Aortic regurgitation PHT measures 598 msec. Mild aortic stenosis is present. Aortic valve mean gradient measures 4.7 mmHg. Aortic valve peak gradient measures 8.1 mmHg. Aortic valve area, by VTI measures 2.00 cm. Pulmonic Valve: The pulmonic valve was normal in structure. Pulmonic valve regurgitation is not visualized. No evidence of pulmonic stenosis. Aorta: The aortic root is normal in size and structure. Venous: The inferior vena cava is dilated in size with less than 50% respiratory variability, suggesting right atrial pressure of 15 mmHg. IAS/Shunts: No atrial level shunt detected by color flow Doppler.  LEFT VENTRICLE PLAX 2D LVIDd:         4.00 cm LVIDs:         2.70 cm LV PW:         1.56 cm LV IVS:        0.82 cm LVOT diam:     2.00 cm LV SV:         52 LV SV Index:   35 LVOT Area:     3.14 cm  RIGHT VENTRICLE RV Basal diam:  2.54 cm RV S prime:     17.50 cm/s TAPSE (M-mode): 3.3 cm LEFT ATRIUM             Index       RIGHT ATRIUM           Index LA diam:        4.30 cm 2.84 cm/m  RA Area:     18.40 cm LA Vol (A2C):   73.2 ml 48.27 ml/m RA Volume:   46.80 ml  30.86 ml/m LA Vol (A4C):   68.6 ml 45.24 ml/m LA Biplane Vol: 74.7 ml 49.26 ml/m  AORTIC VALVE  PULMONIC VALVE AV Area (Vmax):    1.81 cm    PV Vmax:        0.87 m/s AV Area (Vmean):   2.01 cm    PV Peak grad:   3.1 mmHg AV Area (VTI):     2.00 cm    RVOT Peak grad: 4 mmHg AV Vmax:           142.67 cm/s AV Vmean:          92.600 cm/s AV VTI:            0.263 m AV Peak Grad:      8.1 mmHg AV Mean Grad:      4.7 mmHg LVOT Vmax:         82.20 cm/s LVOT Vmean:        59.100 cm/s LVOT VTI:          0.167 m LVOT/AV VTI ratio: 0.64 AI PHT:            598 msec   AORTA Ao Root diam: 2.40 cm MITRAL VALVE                TRICUSPID VALVE MV Area (PHT): 5.46 cm     TR Peak grad:   12.2 mmHg MV Decel Time: 139 msec     TR Vmax:        175.00 cm/s MV E velocity: 102.00 cm/s                             SHUNTS                             Systemic VTI:  0.17 m                             Systemic Diam: 2.00 cm Ida Rogue MD Electronically signed by Ida Rogue MD Signature Date/Time: 12/24/2020/8:37:31 PM    Final         Scheduled Meds: .  stroke: mapping our early stages of recovery book   Does not apply Once  . atorvastatin  10 mg Oral QPM  . citalopram  40 mg Oral Daily  . digoxin  0.125 mg Oral Daily  . docusate sodium  200 mg Oral Daily  . donepezil  10 mg Oral QHS  . ferrous sulfate  325 mg Oral Q breakfast  . insulin aspart  0-5 Units Subcutaneous QHS  . insulin glargine  6 Units Subcutaneous QHS  . pantoprazole  40 mg Oral BID  . rOPINIRole  0.5 mg Oral QPM  . Warfarin - Pharmacist Dosing Inpatient   Does not apply q1600   Continuous Infusions: . sodium chloride 10 mL/hr at 12/24/20 0006     LOS: 1 day    Time spent: 75 min    Desma Maxim, MD [facility]   If 7PM-7AM, please contact night-coverage [email or web address] Password [email or web address] 12/25/2020, 9:22 AM

## 2020-12-25 NOTE — Plan of Care (Signed)
  Problem: Education: Goal: Knowledge of General Education information will improve Description Including pain rating scale, medication(s)/side effects and non-pharmacologic comfort measures Outcome: Progressing   Problem: Activity: Goal: Risk for activity intolerance will decrease Outcome: Progressing   Problem: Safety: Goal: Ability to remain free from injury will improve Outcome: Progressing   

## 2020-12-26 DIAGNOSIS — I160 Hypertensive urgency: Secondary | ICD-10-CM | POA: Diagnosis not present

## 2020-12-26 LAB — CBC
HCT: 31.4 % — ABNORMAL LOW (ref 36.0–46.0)
Hemoglobin: 9.8 g/dL — ABNORMAL LOW (ref 12.0–15.0)
MCH: 25.1 pg — ABNORMAL LOW (ref 26.0–34.0)
MCHC: 31.2 g/dL (ref 30.0–36.0)
MCV: 80.5 fL (ref 80.0–100.0)
Platelets: 198 10*3/uL (ref 150–400)
RBC: 3.9 MIL/uL (ref 3.87–5.11)
RDW: 22.5 % — ABNORMAL HIGH (ref 11.5–15.5)
WBC: 8.5 10*3/uL (ref 4.0–10.5)
nRBC: 0 % (ref 0.0–0.2)

## 2020-12-26 LAB — BASIC METABOLIC PANEL
Anion gap: 6 (ref 5–15)
BUN: 21 mg/dL (ref 8–23)
CO2: 24 mmol/L (ref 22–32)
Calcium: 8.5 mg/dL — ABNORMAL LOW (ref 8.9–10.3)
Chloride: 109 mmol/L (ref 98–111)
Creatinine, Ser: 2.02 mg/dL — ABNORMAL HIGH (ref 0.44–1.00)
GFR, Estimated: 25 mL/min — ABNORMAL LOW (ref >60)
Glucose, Bld: 138 mg/dL — ABNORMAL HIGH (ref 70–99)
Potassium: 4 mmol/L (ref 3.5–5.1)
Sodium: 139 mmol/L (ref 135–145)

## 2020-12-26 LAB — PROTIME-INR
INR: 1.8 — ABNORMAL HIGH (ref 0.8–1.2)
Prothrombin Time: 20.1 seconds — ABNORMAL HIGH (ref 11.4–15.2)

## 2020-12-26 LAB — GLUCOSE, CAPILLARY: Glucose-Capillary: 138 mg/dL — ABNORMAL HIGH (ref 70–99)

## 2020-12-26 MED ORDER — LISINOPRIL 10 MG PO TABS
10.0000 mg | ORAL_TABLET | Freq: Once | ORAL | Status: AC
  Administered 2020-12-26: 10 mg via ORAL
  Filled 2020-12-26: qty 1

## 2020-12-26 MED ORDER — LABETALOL HCL 5 MG/ML IV SOLN
10.0000 mg | INTRAVENOUS | Status: DC | PRN
  Administered 2020-12-26: 10 mg via INTRAVENOUS
  Filled 2020-12-26: qty 4

## 2020-12-26 MED ORDER — HYDRALAZINE HCL 25 MG PO TABS
25.0000 mg | ORAL_TABLET | Freq: Three times a day (TID) | ORAL | Status: DC | PRN
  Administered 2020-12-27 (×2): 25 mg via ORAL
  Filled 2020-12-26 (×2): qty 1

## 2020-12-26 MED ORDER — LISINOPRIL 20 MG PO TABS
20.0000 mg | ORAL_TABLET | Freq: Every day | ORAL | Status: DC
  Administered 2020-12-27 – 2020-12-28 (×2): 20 mg via ORAL
  Filled 2020-12-26 (×3): qty 1

## 2020-12-26 MED ORDER — WARFARIN SODIUM 3 MG PO TABS
3.0000 mg | ORAL_TABLET | Freq: Once | ORAL | Status: AC
  Administered 2020-12-26: 3 mg via ORAL
  Filled 2020-12-26: qty 1

## 2020-12-26 NOTE — Progress Notes (Addendum)
PROGRESS NOTE    LATERICA [name]  SJG:[id number] DOB: 06-Dec-1940 DOA: 12/23/2020 PCP: Lavera Guise, MD      Brief Narrative:   : [name] is a 80 y.o. female with medical history significant for atrial fibrillation on chronic anticoagulation therapy with Coumadin, diabetes mellitus, hypertension, chronic diastolic dysfunction CHF, nicotine dependence, GERD and dyslipidemia who presents to the emergency room for evaluation of generalized weakness which he has had for about 2 weeks.  Patient states that she was transfused about 2 weeks ago and since then she has had progressively worsening weakness.  She denies having any falls and denies feeling dizzy or lightheaded.  EMS was called today because she was unable to get up due to the severity of the weakness.  She is unable to tell me the color of her stools but denies having any hematemesis or hematuria.  She denies any NSAID use.  She complains of a headache but denies having any blurred vision or difficulty swallowing.  She has no focal deficits and is able to move all extremities. She denies having any chest pain, no shortness of breath, no nausea, no vomiting, no palpitations, no diaphoresis, no urinary frequency, no nocturia, no dysuria, no abdominal pain, no constipation, no diarrhea, no cough, no fever or chills.   Assessment & Plan:   Principal Problem:   Hypertensive urgency Active Problems:   GERD (gastroesophageal reflux disease)   Chronic combined systolic and diastolic heart failure (HCC)   Atrial fibrillation (HCC)   CKD stage 4 due to type 2 diabetes mellitus (HCC)   Nicotine dependence   History of anemia due to chronic kidney disease   Weakness  # Generalized weakness and fatigue # Debility Several week history of this. Nothing focal on history or exam. Recent blood transfusion for iron deficiency anemia thought to be 2/2 ckd, hgb is improved (9s) and stable. No electrolyte abnormalities. CT/MRI of head w/o acute  findings. CXR nothing acute. Non-focal exam. Suspect this is dementia-related decline. ua not strongly suggestive of infection and culture is multiple species. b12 is low. Ck, lfts, tsh wnl. TTE wnl - PT/OT advising snf, pt and daughter agreeable, care mgmt pursuing  # B12 deficiency Folate wnl - started b12  # Hypertensive urgency CT/MRI neg. Kidney function at baseline. BPs remain elevated - have started amlodipine, increased to 10 - have started lisinopril, will increase to 20 - hydral prn - home statin  # Chronic atrial fibrillation Rate controlled on digoxin which is therapeutic. INR subtherapeutic - cont home digoxin, warfarin per pharmacy consult - tele  # Dementia # MDD - cont home aricept, celexa  # T2DM Here glucose wnl. On low dose lantus at home - hold home lantus, will monitor fasting sugars, likely add oral medication if elevated  # Restless leg - cont requip  # Iron deficiency anemia Recent blood transfusion. Hgb stable. No report of bleeding/melena. - cont warfarin - monitor - outpt f/u assuming hgb remains stable  # CKD 3/4 Cr 2 which is stable - monitor    DVT prophylaxis: home warfarin Code Status: dnr Family Communication: daughter updated telephonically 3/6  Level of care: Med-Surg Status is: inpt  The patient will require care spanning > 2 midnights and should be moved to inpatient because: Inpatient level of care appropriate due to severity of illness  Dispo: The patient is from: Home              Anticipated d/c is to: SNF  Patient currently is not medically stable to d/c.   Difficult to place patient No        Consultants:  none  Procedures: none  Antimicrobials:  none    Subjective: This morning no complaints. Has appetite. No chest pain or cough. No abd pain. No n/v.  Objective: Vitals:   12/26/20 0449 12/26/20 0557 12/26/20 0806 12/26/20 0858  BP: (!) 196/55 (!) 191/51 (!) 180/52   Pulse: (!) 54  (!) 58 (!) 55 63  Resp:   18   Temp:   97.8 F (36.6 C)   TempSrc:   Oral   SpO2: 100% 99% 99%   Weight:      Height:        Intake/Output Summary (Last 24 hours) at 12/26/2020 0911 Last data filed at 12/26/2020 0806 Gross per 24 hour  Intake --  Output 950 ml  Net -950 ml   Filed Weights   12/23/20 1034 12/25/20 0409 12/26/20 0101  Weight: 59 kg 51.9 kg 53.7 kg    Examination:  General exam: Appears calm and comfortable. adentous Respiratory system: few scattered rhonchi. Respiratory effort normal. Cardiovascular system: irreg irreg, faint systolic murmur Gastrointestinal system: Abdomen is nondistended, soft and nontender. No organomegaly or masses felt. Normal bowel sounds heard. Central nervous system: Alert and oriented. No focal neurological deficits. Extremities: Symmetric 5 x 5 power. Skin: No rashes, lesions or ulcers Psychiatry: Judgement and insight appear normal. Mood & affect appropriate.     Data Reviewed: I have personally reviewed following labs and imaging studies  CBC: Recent Labs  Lab 12/23/20 1040 12/24/20 0456 12/25/20 0510 12/26/20 0539  WBC 6.8 8.0 8.0 8.5  HGB 8.7* 9.1* 9.3* 9.8*  HCT 28.8* 29.4* 30.6* 31.4*  MCV 80.9 79.7* 80.5 80.5  PLT 221 215 218 846   Basic Metabolic Panel: Recent Labs  Lab 12/23/20 1040 12/24/20 0841 12/25/20 0510 12/26/20 0539  NA 137 140 139 139  K 4.1 4.2 4.2 4.0  CL 108 110 109 109  CO2 22 22 24 24   GLUCOSE 187* 115* 133* 138*  BUN 17 17 19 21   CREATININE 2.02* 1.84* 1.95* 2.02*  CALCIUM 8.3* 8.1* 8.4* 8.5*   GFR: Estimated Creatinine Clearance: 16.4 mL/min (A) (by C-G formula based on SCr of 2.02 mg/dL (H)). Liver Function Tests: Recent Labs  Lab 12/24/20 0841  AST 15  ALT 9  ALKPHOS 79  BILITOT 0.9  PROT 6.1*  ALBUMIN 2.2*   No results for input(s): LIPASE, AMYLASE in the last 168 hours. No results for input(s): AMMONIA in the last 168 hours. Coagulation Profile: Recent Labs  Lab  12/23/20 1507 12/24/20 0456 12/25/20 0510 12/26/20 0539  INR 1.4* 1.4* 1.5* 1.8*   Cardiac Enzymes: Recent Labs  Lab 12/24/20 0841  CKTOTAL 52   BNP (last 3 results) No results for input(s): PROBNP in the last 8760 hours. HbA1C: Recent Labs    12/24/20 0456  HGBA1C 6.9*   CBG: Recent Labs  Lab 12/25/20 0745 12/25/20 1131 12/25/20 1627 12/25/20 2056 12/26/20 0805  GLUCAP 135* 163* 118* 122* 138*   Lipid Profile: Recent Labs    12/24/20 0456  CHOL 97  HDL 22*  LDLCALC 44  TRIG 153*  CHOLHDL 4.4   Thyroid Function Tests: Recent Labs    12/24/20 0841  TSH 2.913   Anemia Panel: Recent Labs    12/24/20 0841 12/25/20 0510  VITAMINB12 151*  --   FOLATE  --  10.9   Urine analysis:  Component Value Date/Time   COLORURINE YELLOW (A) 12/23/2020 1040   APPEARANCEUR HAZY (A) 12/23/2020 1040   APPEARANCEUR Cloudy (A) 03/24/2020 0000   LABSPEC 1.013 12/23/2020 1040   PHURINE 6.0 12/23/2020 1040   GLUCOSEU 150 (A) 12/23/2020 1040   HGBUR MODERATE (A) 12/23/2020 1040   BILIRUBINUR NEGATIVE 12/23/2020 1040   BILIRUBINUR Negative 03/24/2020 0000   KETONESUR NEGATIVE 12/23/2020 1040   PROTEINUR >=300 (A) 12/23/2020 1040   UROBILINOGEN 0.2 09/02/2019 1441   NITRITE NEGATIVE 12/23/2020 1040   LEUKOCYTESUR NEGATIVE 12/23/2020 1040   Sepsis Labs: @LABRCNTIP (procalcitonin:4,lacticidven:4)  ) Recent Results (from the past 240 hour(s))  Urine Culture     Status: Abnormal   Collection Time: 12/23/20 10:40 AM   Specimen: Urine, Random  Result Value Ref Range Status   Specimen Description   Final    URINE, RANDOM Performed at [facility], [address], [place], [place] [address]    Special Requests   Final    NONE Performed at [facility], [address], [place], [place] [address]    Culture MULTIPLE SPECIES PRESENT, SUGGEST RECOLLECTION (A)  Final   Report Status 12/25/2020 FINAL  Final  Resp Panel by RT-PCR (Flu A&B, Covid)  Nasopharyngeal Swab     Status: None   Collection Time: 12/23/20 12:52 PM   Specimen: Nasopharyngeal Swab; Nasopharyngeal(NP) swabs in vial transport medium  Result Value Ref Range Status   SARS Coronavirus 2 by RT PCR NEGATIVE NEGATIVE Final    Comment: (NOTE) SARS-CoV-2 target nucleic acids are NOT DETECTED.  The SARS-CoV-2 RNA is generally detectable in upper respiratory specimens during the acute phase of infection. The lowest concentration of SARS-CoV-2 viral copies this assay can detect is 138 copies/mL. A negative result does not preclude SARS-Cov-2 infection and should not be used as the sole basis for treatment or other patient management decisions. A negative result may occur with  improper specimen collection/handling, submission of specimen other than nasopharyngeal swab, presence of viral mutation(s) within the areas targeted by this assay, and inadequate number of viral copies(<138 copies/mL). A negative result must be combined with clinical observations, patient history, and epidemiological information. The expected result is Negative.  Fact Sheet for Patients:  EntrepreneurPulse.com.au  Fact Sheet for Healthcare Providers:  IncredibleEmployment.be  This test is no t yet approved or cleared by the [place] FDA and  has been authorized for detection and/or diagnosis of SARS-CoV-2 by FDA under an Emergency Use Authorization (EUA). This EUA will remain  in effect (meaning this test can be used) for the duration of the COVID-19 declaration under Section 564(b)(1) of the Act, 21 U.S.C.section 360bbb-3(b)(1), unless the authorization is terminated  or revoked sooner.       Influenza A by PCR NEGATIVE NEGATIVE Final   Influenza B by PCR NEGATIVE NEGATIVE Final    Comment: (NOTE) The Xpert Xpress SARS-CoV-2/FLU/RSV plus assay is intended as an aid in the diagnosis of influenza from Nasopharyngeal swab specimens and should not be  used as a sole basis for treatment. Nasal washings and aspirates are unacceptable for Xpert Xpress SARS-CoV-2/FLU/RSV testing.  Fact Sheet for Patients: EntrepreneurPulse.com.au  Fact Sheet for Healthcare Providers: IncredibleEmployment.be  This test is not yet approved or cleared by the [place] FDA and has been authorized for detection and/or diagnosis of SARS-CoV-2 by FDA under an Emergency Use Authorization (EUA). This EUA will remain in effect (meaning this test can be used) for the duration of the COVID-19 declaration under Section 564(b)(1) of  the Act, 21 U.S.C. section 360bbb-3(b)(1), unless the authorization is terminated or revoked.  Performed at [facility], [address] [place], [place] [address]          Radiology Studies: ECHOCARDIOGRAM COMPLETE  Result Date: 12/24/2020    ECHOCARDIOGRAM REPORT   Patient Name:   TWISHA VANPELT Date of Exam: 12/24/2020 Medical Rec #:  [id number]      Height:       58.0 in Accession #:    [id number]     Weight:       130.0 lb Date of Birth:  [birth year]      BSA:          1.516 m Patient Age:    34 years       BP:           185/65 mmHg Patient Gender: F              HR:           72 bpm. Exam Location:  [facility] Procedure: 2D Echo, Cardiac Doppler and Color Doppler Indications:     Cardiomyopathy-- Hypertrophic I42.1  History:         Patient has prior history of Echocardiogram examinations, most                  recent 05/11/2020. Arrythmias:Atrial Fibrillation; Risk                  Factors:Hypertension, Dyslipidemia and Diabetes.  Sonographer:     Sherrie Sport RDCS (AE) Referring Phys:  [id number] Collier Bullock Diagnosing Phys: Ida Rogue MD IMPRESSIONS  1. Left ventricular ejection fraction, by estimation, is 60 to 65%. The left ventricle has normal function. The left ventricle has no regional wall motion abnormalities. Left ventricular diastolic parameters are indeterminate.  2. Right  ventricular systolic function is normal. The right ventricular size is normal. There is normal pulmonary artery systolic pressure.  3. Left atrial size was mildly dilated.  4. There is moderate calcification of the aortic valve. Aortic valve regurgitation is mild. Mild aortic valve stenosis.  5. The inferior vena cava is dilated in size with <50% respiratory variability, suggesting right atrial pressure of 15 mmHg.  6. Left pleural effusion noted. FINDINGS  Left Ventricle: Left ventricular ejection fraction, by estimation, is 60 to 65%. The left ventricle has normal function. The left ventricle has no regional wall motion abnormalities. The left ventricular internal cavity size was normal in size. There is  no left ventricular hypertrophy. Left ventricular diastolic parameters are indeterminate. Right Ventricle: The right ventricular size is normal. No increase in right ventricular wall thickness. Right ventricular systolic function is normal. There is normal pulmonary artery systolic pressure. The tricuspid regurgitant velocity is 1.75 m/s, and  with an assumed right atrial pressure of 10 mmHg, the estimated right ventricular systolic pressure is 25.8 mmHg. Left Atrium: Left atrial size was mildly dilated. Right Atrium: Right atrial size was normal in size. Pericardium: There is no evidence of pericardial effusion. Mitral Valve: The mitral valve is normal in structure. Mild mitral annular calcification. No evidence of mitral valve regurgitation. No evidence of mitral valve stenosis. Tricuspid Valve: The tricuspid valve is normal in structure. Tricuspid valve regurgitation is not demonstrated. No evidence of tricuspid stenosis. Aortic Valve: The aortic valve was not well visualized. There is moderate calcification of the aortic valve. Aortic valve regurgitation is mild. Aortic regurgitation PHT measures 598 msec. Mild aortic stenosis is present. Aortic valve  mean gradient measures 4.7 mmHg. Aortic valve peak  gradient measures 8.1 mmHg. Aortic valve area, by VTI measures 2.00 cm. Pulmonic Valve: The pulmonic valve was normal in structure. Pulmonic valve regurgitation is not visualized. No evidence of pulmonic stenosis. Aorta: The aortic root is normal in size and structure. Venous: The inferior vena cava is dilated in size with less than 50% respiratory variability, suggesting right atrial pressure of 15 mmHg. IAS/Shunts: No atrial level shunt detected by color flow Doppler.  LEFT VENTRICLE PLAX 2D LVIDd:         4.00 cm LVIDs:         2.70 cm LV PW:         1.56 cm LV IVS:        0.82 cm LVOT diam:     2.00 cm LV SV:         52 LV SV Index:   35 LVOT Area:     3.14 cm  RIGHT VENTRICLE RV Basal diam:  2.54 cm RV S prime:     17.50 cm/s TAPSE (M-mode): 3.3 cm LEFT ATRIUM             Index       RIGHT ATRIUM           Index LA diam:        4.30 cm 2.84 cm/m  RA Area:     18.40 cm LA Vol (A2C):   73.2 ml 48.27 ml/m RA Volume:   46.80 ml  30.86 ml/m LA Vol (A4C):   68.6 ml 45.24 ml/m LA Biplane Vol: 74.7 ml 49.26 ml/m  AORTIC VALVE                   PULMONIC VALVE AV Area (Vmax):    1.81 cm    PV Vmax:        0.87 m/s AV Area (Vmean):   2.01 cm    PV Peak grad:   3.1 mmHg AV Area (VTI):     2.00 cm    RVOT Peak grad: 4 mmHg AV Vmax:           142.67 cm/s AV Vmean:          92.600 cm/s AV VTI:            0.263 m AV Peak Grad:      8.1 mmHg AV Mean Grad:      4.7 mmHg LVOT Vmax:         82.20 cm/s LVOT Vmean:        59.100 cm/s LVOT VTI:          0.167 m LVOT/AV VTI ratio: 0.64 AI PHT:            598 msec  AORTA Ao Root diam: 2.40 cm MITRAL VALVE                TRICUSPID VALVE MV Area (PHT): 5.46 cm     TR Peak grad:   12.2 mmHg MV Decel Time: 139 msec     TR Vmax:        175.00 cm/s MV E velocity: 102.00 cm/s                             SHUNTS                             Systemic VTI:  0.17 m                             Systemic Diam: 2.00 cm Ida Rogue MD Electronically signed by Ida Rogue MD Signature  Date/Time: 12/24/2020/8:37:31 PM    Final         Scheduled Meds: .  stroke: mapping our early stages of recovery book   Does not apply Once  . atorvastatin  10 mg Oral QPM  . citalopram  40 mg Oral Daily  . digoxin  0.125 mg Oral Daily  . docusate sodium  200 mg Oral Daily  . donepezil  10 mg Oral QHS  . ferrous sulfate  325 mg Oral Q breakfast  . lisinopril  10 mg Oral Once  . [START ON 12/27/2020] lisinopril  20 mg Oral Daily  . pantoprazole  40 mg Oral BID  . rOPINIRole  0.5 mg Oral QPM  . vitamin B-12  1,000 mcg Oral Daily  . warfarin  3 mg Oral ONCE-1600  . Warfarin - Pharmacist Dosing Inpatient   Does not apply q1600   Continuous Infusions: . sodium chloride 10 mL/hr at 12/24/20 0006     LOS: 2 days    Time spent: 25 min    Desma Maxim, MD [facility]   If 7PM-7AM, please contact night-coverage [email or web address] Password [email or web address] 12/26/2020, 9:11 AM

## 2020-12-26 NOTE — Consult Note (Signed)
[place] for Warfarin Indication: atrial fibrillation  Patient Measurements: Height: 4\' 10"  (147.3 cm) Weight: 53.7 kg (118 lb 4.8 oz) IBW/kg (Calculated) : 40.9  Labs: Recent Labs    12/23/20 1252 12/23/20 1507 12/23/20 1507 12/23/20 1642 12/24/20 0456 12/24/20 0841 12/25/20 0510 12/26/20 0539  HGB  --   --   --   --  9.1*  --  9.3* 9.8*  HCT  --   --   --   --  29.4*  --  30.6* 31.4*  PLT  --   --   --   --  215  --  218 198  LABPROT  --  16.8*   < >  --  16.5*  --  17.9* 20.1*  INR  --  1.4*   < >  --  1.4*  --  1.5* 1.8*  CREATININE  --   --   --   --   --  1.84* 1.95* 2.02*  CKTOTAL  --   --   --   --   --  52  --   --   TROPONINIHS 18* 19*  --  18*  --   --   --   --    < > = values in this interval not displayed.    Estimated Creatinine Clearance: 16.4 mL/min (A) (by C-G formula based on SCr of 2.02 mg/dL (H)).   Medications:  Warfarin 2mg  QD PTA No other APT  DDIs: APAP prn, Celexa & tramadol, ropinirole, digoxin  Assessment: 80 y.o. female PMH of Afib (on coumadin INR 2-3), DM, HTN, diaCHF, nicotine dependence, GERD and HLD presenting with generalized weakness x2 wks. Pharmacy consulted to manage warfarin while inpatient.  Baseline:  Hgb 8.7 (anemia of CKD) ; Plts 221   Date INR Dose/Comment 3/03 1.4 Warfarin 3 mg 3/04 1.4 Warfarin 3 mg 3/05 1.5 3.5 mg 3/06 1.8   DDI: Warfarin/Ropinirole  Goal of Therapy:  INR 2-3   Plan:  --INR is subtherapeutic but trending up. Will give warfarin 3 mg tonight --Daily INR per protocol --CBC at least every 3 days per protocol  Courtney Fenlon A 12/26/2020,9:00 AM

## 2020-12-26 NOTE — Plan of Care (Signed)
Alert, oriented to person and place as hospital, not aware of city and state. Able to make needs known, calm and cooperative. SBP elevated at 207, messaged on-call provider for lower BP parameter since pt is negative for stroke on MRI, on 4th day hospitalization, was started on lisinopril. Orders given, BP monitored and rechecked. Endorsed care to [place], [place]

## 2020-12-27 DIAGNOSIS — I160 Hypertensive urgency: Secondary | ICD-10-CM | POA: Diagnosis not present

## 2020-12-27 LAB — BASIC METABOLIC PANEL
Anion gap: 8 (ref 5–15)
BUN: 23 mg/dL (ref 8–23)
CO2: 23 mmol/L (ref 22–32)
Calcium: 8.2 mg/dL — ABNORMAL LOW (ref 8.9–10.3)
Chloride: 106 mmol/L (ref 98–111)
Creatinine, Ser: 2.05 mg/dL — ABNORMAL HIGH (ref 0.44–1.00)
GFR, Estimated: 24 mL/min — ABNORMAL LOW (ref >60)
Glucose, Bld: 129 mg/dL — ABNORMAL HIGH (ref 70–99)
Potassium: 4.3 mmol/L (ref 3.5–5.1)
Sodium: 137 mmol/L (ref 135–145)

## 2020-12-27 LAB — CBC
HCT: 30.1 % — ABNORMAL LOW (ref 36.0–46.0)
Hemoglobin: 9.1 g/dL — ABNORMAL LOW (ref 12.0–15.0)
MCH: 24.7 pg — ABNORMAL LOW (ref 26.0–34.0)
MCHC: 30.2 g/dL (ref 30.0–36.0)
MCV: 81.6 fL (ref 80.0–100.0)
Platelets: 201 10*3/uL (ref 150–400)
RBC: 3.69 MIL/uL — ABNORMAL LOW (ref 3.87–5.11)
RDW: 22.7 % — ABNORMAL HIGH (ref 11.5–15.5)
WBC: 9 10*3/uL (ref 4.0–10.5)
nRBC: 0 % (ref 0.0–0.2)

## 2020-12-27 LAB — PROTIME-INR
INR: 2 — ABNORMAL HIGH (ref 0.8–1.2)
Prothrombin Time: 22.4 seconds — ABNORMAL HIGH (ref 11.4–15.2)

## 2020-12-27 MED ORDER — AMLODIPINE BESYLATE 5 MG PO TABS
10.0000 mg | ORAL_TABLET | Freq: Every day | ORAL | Status: DC
  Administered 2020-12-27 – 2020-12-29 (×3): 10 mg via ORAL
  Filled 2020-12-27 (×3): qty 1

## 2020-12-27 MED ORDER — WARFARIN SODIUM 3 MG PO TABS
3.0000 mg | ORAL_TABLET | Freq: Once | ORAL | Status: AC
  Administered 2020-12-27: 3 mg via ORAL
  Filled 2020-12-27: qty 1

## 2020-12-27 NOTE — Progress Notes (Signed)
Physical Therapy Treatment Patient Details Name: [name] MRN: [id number] DOB: [birth year] Today's Date: 12/27/2020    History of Present Illness 80 y.o. female with medical history significant for A-fib on chronic anticoagulation therapy with Coumadin, diabetes mellitus, HTN, chronic diastolic dysfunction CHF, CKD 3/4, nicotine dependence, dementia, GERD and dyslipidemia who presents to the emergency room for evaluation of generalized weakness which he has had for about 2 weeks.  Patient states that she was transfused about 2 weeks ago and since then she has had progressively worsening weakness. Upon arrival to the ER she was noted to have significant elevated blood pressure. Imaging negative for acute infarct.    PT Comments    Pt seen for overlapping OT/PT co-tx this date.  Pt received in bed.  Pt was able to undergo therapeutic exercise in bed before therapist noted a the patient to have soiled linens and needing assist for care.  Pt required min-modA +2 for supine to seated EOB and verbal cues for hand placement.  Pt continues to have posterior lean when seated EOB however resolves and is able to achieve static sitting balance without support.  Pt required modA +1 for intial STS with RW and pt continues to demonstrate diffiuclty due to tremors.  Pt continues to have posterior lean even in standing.  Pt required maxA for pericare from OT.  Pt required a brief seated rest break during pericare due to fatigue from prolonged standing.  Pt performed STS without much difficulty on second attempt to finish pericare and then attempted to transfer to recliner in room.  Pt required modA from therapists in order to maintain safety and navigate walker over to chair.  Pt then left in chair with chair alarm set and nursing notifed of mobility and redness of skin around sacrum.  All needs met and pt left with call bell within place.  Continue to recommend SNF at this time.    Follow Up Recommendations   SNF;Supervision/Assistance - 24 hour     Equipment Recommendations  Rolling walker with 5" wheels    Recommendations for Other Services       Precautions / Restrictions Precautions Precautions: Fall Precaution Comments: tremors Restrictions Weight Bearing Restrictions: No    Mobility  Bed Mobility Overal bed mobility: Needs Assistance Bed Mobility: Rolling;Supine to Sit Rolling: Min assist     Sit to supine: Min assist;Mod assist;+2 for physical assistance   General bed mobility comments: +2 assist for trunk and BLE mgt, cues for hand placement    Transfers Overall transfer level: Needs assistance Equipment used: Rolling walker (2 wheeled) Transfers: Sit to/from Stand Sit to Stand: Mod assist;+2 safety/equipment         General transfer comment: MOD A with +2 for safety with initial STS, improving to CGA x2 for 2nd attempt  Ambulation/Gait         Gait velocity: decreased       Stairs             Wheelchair Mobility    Modified Rankin (Stroke Patients Only)       Balance Overall balance assessment: Needs assistance Sitting-balance support: Single extremity supported;Feet supported Sitting balance-Leahy Scale: Fair     Standing balance support: Bilateral upper extremity supported Standing balance-Leahy Scale: Fair Standing balance comment: tremors, cues for anterior weight shift                            Cognition Arousal/Alertness: Awake/alert Behavior  During Therapy: WFL for tasks assessed/performed Overall Cognitive Status: No family/caregiver present to determine baseline cognitive functioning                                 General Comments: pt follows simple commands with slightly increased processing time, cues for sequencing      Exercises Total Joint Exercises Ankle Circles/Pumps: AROM;Strengthening;Both;10 reps;Supine Quad Sets: AROM;Strengthening;Both;10 reps;Supine (Pt continues to require  tactile cuing for quad contraction.) Gluteal Sets: AROM;Strengthening;Both;10 reps;Supine Hip ABduction/ADduction: AROM;Strengthening;Both;10 reps;Supine Straight Leg Raises: AROM;Strengthening;Both;10 reps;Supine Long Arc Quad: AROM;Strengthening;Both;10 reps;Seated Other Exercises Other Exercises: Bed mobility, pericare s/p BM in standing, RW mgt, bed>recliner with MOD A +2 assist    General Comments General comments (skin integrity, edema, etc.): pt noted to have soiled linens in bed, pericare in standing, sacrum noted with some redness. RN notified. New PureWick placed      Pertinent Vitals/Pain Pain Assessment: No/denies pain    Home Living                      Prior Function            PT Goals (current goals can now be found in the care plan section) Acute Rehab PT Goals Patient Stated Goal: get stronger Time For Goal Achievement: 01/07/21 Potential to Achieve Goals: Fair Progress towards PT goals: Progressing toward goals    Frequency    Min 2X/week      PT Plan      Co-evaluation   Reason for Co-Treatment: For patient/therapist safety;To address functional/ADL transfers PT goals addressed during session: Mobility/safety with mobility;Balance;Proper use of DME OT goals addressed during session: ADL's and self-care;Proper use of Adaptive equipment and DME      AM-PAC PT "6 Clicks" Mobility   Outcome Measure  Help needed turning from your back to your side while in a flat bed without using bedrails?: A Little Help needed moving from lying on your back to sitting on the side of a flat bed without using bedrails?: A Little Help needed moving to and from a bed to a chair (including a wheelchair)?: A Little Help needed standing up from a chair using your arms (e.g., wheelchair or bedside chair)?: A Lot Help needed to walk in hospital room?: A Lot Help needed climbing 3-5 steps with a railing? : Total 6 Click Score: 14    End of Session Equipment  Utilized During Treatment: Gait belt Activity Tolerance: Patient tolerated treatment well Patient left: in chair;with call bell/phone within reach;with chair alarm set Nurse Communication: Mobility status;Other (comment) (No hand held call bell in room, nursing notified.  Pt was left within arms reach of the call bell that is on the bedrail.) PT Visit Diagnosis: Unsteadiness on feet (R26.81);Other abnormalities of gait and mobility (R26.89);Muscle weakness (generalized) (M62.81);Difficulty in walking, not elsewhere classified (R26.2)     Time: 3491-7915 PT Time Calculation (min) (ACUTE ONLY): 33 min  Charges:  $Therapeutic Exercise: 8-22 mins                     Gwenlyn Saran, PT, DPT 12/27/20, 12:44 PM

## 2020-12-27 NOTE — Consult Note (Addendum)
ANTICOAGULATION CONSULT NOTE  Pharmacy Consult for Warfarin Indication: atrial fibrillation  Patient Measurements: Height: 4\' 10"  (147.3 cm) Weight: 53.7 kg (118 lb 4.8 oz) IBW/kg (Calculated) : 40.9  Labs: Recent Labs    12/25/20 0510 12/26/20 0539 12/27/20 0530  HGB 9.3* 9.8* 9.1*  HCT 30.6* 31.4* 30.1*  PLT 218 198 201  LABPROT 17.9* 20.1* 22.4*  INR 1.5* 1.8* 2.0*  CREATININE 1.95* 2.02* 2.05*    Estimated Creatinine Clearance: 16.2 mL/min (A) (by C-G formula based on SCr of 2.05 mg/dL (H)).   Medications:  Warfarin 2mg  QD PTA No other APT  DDIs: APAP prn, Celexa & tramadol, ropinirole, digoxin  Assessment: 80 y.o. female PMH of Afib (on coumadin INR 2-3), DM, HTN, diaCHF, nicotine dependence, GERD and HLD presenting with generalized weakness x2 wks. Pharmacy consulted to manage warfarin while inpatient. Patient has been on Warfarin for years and toward the end of 2021, INR levels have not been stable and supratherapeutic while on 4 mg (1.5 tabs QD except M&F)   INR trends: (INR: 3.2 on 08/02/20, 4.0 on 08/11/20, 5.6 on 09/28/20).   Outpatient provider changed patient dose to 5 mg QD on 11/01/20 and the dose was supratherapeutic (INR 5.7 as of 2/14). Due to this, it was decreased to 2 mg QD on 12/06/20 and levels were subtherapeutic (INR 1.5 as of 3/1).   Baseline:  Hgb 8.7 (anemia of CKD) ; Plts 221   Date INR Dose/Comment 3/03 1.4 Warfarin 3 mg 3/04 1.4 Warfarin 3 mg 3/05 1.5 Warfarin 3.5 mg 3/06 1.8       Warfarin 3 mg  3/07     2.0       Warfarin 3 mg   DDI: Warfarin/Ropinirole (ropinerole may increase INR while on warfarin), Warfarin/Celexa (SSRIs may increase bleed risk), Warfarin/Acetominophen (may increase bleed risk), Warfarin/Protonix (may increase INR)   Goal of Therapy:  INR 2-3   Plan:  --INR is now therapeutic and trending up. Will give warfarin 3 mg tonight. Reassess next dose based on INR tomorrow. --Daily INR per protocol --CBC at least  every 3 days per protocol  Doreatha Massed 12/27/2020,1:11 PM

## 2020-12-27 NOTE — TOC Progression Note (Addendum)
Transition of Care [facility]) - Progression Note    Patient Details  Name: [name] MRN: [id number] Date of Birth: [birth year]  Transition of Care [facility] CM/SW [place], RN Phone Number: 12/27/2020, 2:29 PM  Clinical Narrative:  Called and spoke with Hilda Blades from [facility] in reference to bed offer, she requested PT/OT notes to be faxed. I did give her the [facility] reference number that I received, pending authorization, [id number]. Hilda Blades says she will also follow the process for authorization approval required for her facility. I did fax documents requested to [phone].   [facility] with [facility] # [id number] for SNF rehab.         Expected Discharge Plan and Services                                                 Social Determinants of Health (SDOH) Interventions    Readmission Risk Interventions No flowsheet data found.

## 2020-12-27 NOTE — Progress Notes (Signed)
Occupational Therapy Treatment Patient Details Name: [name] MRN: [id number] DOB: Feb 11, 1941 Today's Date: 12/27/2020    History of present illness 80 y.o. female with medical history significant for A-fib on chronic anticoagulation therapy with Coumadin, diabetes mellitus, HTN, chronic diastolic dysfunction CHF, CKD 3/4, nicotine dependence, dementia, GERD and dyslipidemia who presents to the emergency room for evaluation of generalized weakness which he has had for about 2 weeks.  Patient states that she was transfused about 2 weeks ago and since then she has had progressively worsening weakness. Upon arrival to the ER she was noted to have significant elevated blood pressure. Imaging negative for acute infarct.   OT comments  Pt seen for overlapping OT/PT co-tx this date. Pt received in bed, noted to have soiled linens and needing assist for care. Pt instructed in bed mobility ultimately requiring MIN-MOD +2 for supine>sit EOB with cues for hand placement. Once seated EOB, pt able to maintain fair static sitting balance with SBA. Pt required MOD A x1 with +1 for safety for initial STS with RW and noted with tremors, impairing static standing balance. With assist from PT to support standing, pt unable to let go of RW requiring MAX A for pericare. Redness noted on sacrum (RN notified at end of session). Pt required a brief seated rest break during pericare 2/2 fatigue. Pt noted with improved STS transfer from EOB with RW on 2nd attempt, only requiring CGA+2. Pt required cues for RW mgt and sequencing to support getting from EOB to the recliner a few feet away. New Purewick placed. Bed linens changed. Pt demonstrated increased tremors this date and required increased assist compared to previous evaluation by this therapist. Pt continues to benefit from skilled OT services to maximize return to PLOF and minimize risk of falls, functional decline, and increased caregiver burden. Continue to recommend  SNF at discharge.    Follow Up Recommendations  SNF    Equipment Recommendations  3 in 1 bedside commode;Other (comment) (low vision set up/strategies to maximize access to environment)    Recommendations for Other Services      Precautions / Restrictions Precautions Precautions: Fall Precaution Comments: tremors Restrictions Weight Bearing Restrictions: No       Mobility Bed Mobility Overal bed mobility: Needs Assistance Bed Mobility: Rolling;Supine to Sit Rolling: Min assist     Sit to supine: Min assist;Mod assist;+2 for physical assistance   General bed mobility comments: +2 assist for trunk and BLE mgt, cues for hand placement    Transfers Overall transfer level: Needs assistance Equipment used: Rolling walker (2 wheeled) Transfers: Sit to/from Stand Sit to Stand: Mod assist;+2 safety/equipment         General transfer comment: MOD A with +2 for safety with initial STS, improving to CGA x2 for 2nd attempt    Balance Overall balance assessment: Needs assistance Sitting-balance support: Single extremity supported;Feet supported Sitting balance-Leahy Scale: Fair     Standing balance support: Bilateral upper extremity supported Standing balance-Leahy Scale: Fair Standing balance comment: tremors, cues for anterior weight shift                           ADL either performed or assessed with clinical judgement   ADL Overall ADL's : Needs assistance/impaired                             Toileting- Clothing Manipulation and Hygiene: Sit to/from stand;Maximal  assistance;Minimal assistance Toileting - Clothing Manipulation Details (indicate cue type and reason): Min A for standing/balance with RW from PT; MAX A for pericare in standing from OT; requiring seated rest break prior to completing 2/2 fatigue in standing     Functional mobility during ADLs: +2 for physical assistance;Moderate assistance;Cueing for sequencing;Rolling walker        Vision Baseline Vision/History: Legally blind;Macular Degeneration Patient Visual Report: No change from baseline     Perception     Praxis      Cognition Arousal/Alertness: Awake/alert Behavior During Therapy: WFL for tasks assessed/performed Overall Cognitive Status: No family/caregiver present to determine baseline cognitive functioning                                 General Comments: pt follows simple commands with slightly increased processing time, cues for sequencing        Exercises Other Exercises Other Exercises: Bed mobility, pericare s/p BM in standing, RW mgt, bed>recliner with MOD A +2 assist   Shoulder Instructions       General Comments pt noted to have soiled linens in bed, pericare in standing, sacrum noted with some redness. RN notified. New PureWick placed    Pertinent Vitals/ Pain       Pain Assessment: No/denies pain  Home Living                                          Prior Functioning/Environment              Frequency  Min 1X/week        Progress Toward Goals  OT Goals(current goals can now be found in the care plan section)  Progress towards OT goals: Progressing toward goals  Acute Rehab OT Goals Patient Stated Goal: get stronger OT Goal Formulation: With patient Time For Goal Achievement: 01/07/21 Potential to Achieve Goals: Good  Plan Discharge plan remains appropriate;Frequency remains appropriate    Co-evaluation    PT/OT/SLP Co-Evaluation/Treatment: Yes Reason for Co-Treatment: For patient/therapist safety;To address functional/ADL transfers PT goals addressed during session: Mobility/safety with mobility;Balance;Proper use of DME OT goals addressed during session: ADL's and self-care;Proper use of Adaptive equipment and DME      AM-PAC OT "6 Clicks" Daily Activity     Outcome Measure   Help from another person eating meals?: A Little Help from another person taking care of  personal grooming?: A Little Help from another person toileting, which includes using toliet, bedpan, or urinal?: A Lot Help from another person bathing (including washing, rinsing, drying)?: A Little Help from another person to put on and taking off regular upper body clothing?: A Little Help from another person to put on and taking off regular lower body clothing?: A Little 6 Click Score: 17    End of Session Equipment Utilized During Treatment: Rolling walker  OT Visit Diagnosis: Other abnormalities of gait and mobility (R26.89);Muscle weakness (generalized) (M62.81)   Activity Tolerance Patient tolerated treatment well   Patient Left in chair;with call bell/phone within reach;with chair alarm set   Nurse Communication Mobility status;Other (comment) (redness noted on sacrum)        Time: 7371-0626 OT Time Calculation (min): 20 min  Charges: OT General Charges $OT Visit: 1 Visit OT Treatments $Self Care/Home Management : 8-22 mins  Hanley Hays, MPH, MS, OTR/L  ascom [phone] 12/27/20, 11:52 AM

## 2020-12-27 NOTE — Progress Notes (Addendum)
PROGRESS NOTE    [name]  QPR:[id number] DOB: [birth year] DOA: 12/23/2020 PCP: Lavera Guise, MD      Brief Narrative:   : [name] is a 80 y.o. female with medical history significant for atrial fibrillation on chronic anticoagulation therapy with Coumadin, diabetes mellitus, hypertension, chronic diastolic dysfunction CHF, nicotine dependence, GERD and dyslipidemia who presents to the emergency room for evaluation of generalized weakness which he has had for about 2 weeks.  Patient states that she was transfused about 2 weeks ago and since then she has had progressively worsening weakness.  She denies having any falls and denies feeling dizzy or lightheaded.  EMS was called today because she was unable to get up due to the severity of the weakness.  She is unable to tell me the color of her stools but denies having any hematemesis or hematuria.  She denies any NSAID use.  She complains of a headache but denies having any blurred vision or difficulty swallowing.  She has no focal deficits and is able to move all extremities. She denies having any chest pain, no shortness of breath, no nausea, no vomiting, no palpitations, no diaphoresis, no urinary frequency, no nocturia, no dysuria, no abdominal pain, no constipation, no diarrhea, no cough, no fever or chills.   Assessment & Plan:   Principal Problem:   Hypertensive urgency Active Problems:   GERD (gastroesophageal reflux disease)   Chronic combined systolic and diastolic heart failure (HCC)   Atrial fibrillation (HCC)   CKD stage 4 due to type 2 diabetes mellitus (HCC)   Nicotine dependence   History of anemia due to chronic kidney disease   Weakness  # Generalized weakness and fatigue # Debility Several week history of this. Nothing focal on history or exam. Recent blood transfusion for iron deficiency anemia thought to be 2/2 ckd, hgb is improved (9s) and stable. No electrolyte abnormalities. CT/MRI of head w/o acute  findings. CXR nothing acute. Non-focal exam. Suspect this is dementia-related decline. ua not strongly suggestive of infection and culture is multiple species. b12 is low. Ck, lfts, tsh wnl. TTE wnl. Daughter most concerned about patient's relatively new gait dysfunction - will check mri of cervical and lumbar spine - PT/OT advising snf, pt and daughter agreeable, care mgmt pursuing - addendum 16:30- has bed offer, too late to d/c today, will plan on d/c tomorrow  # B12 deficiency Folate wnl - started b12  # Hypertensive urgency CT/MRI neg. Kidney function at baseline. BPs remain elevated though wnl this morning - have started amlodipine 10 qd - have started lisinopril 20 qd - hydral prn - home statin  # Chronic atrial fibrillation Rate controlled on digoxin which is therapeutic. INR subtherapeutic initially, now therapeutic. - cont home digoxin, warfarin per pharmacy consult - tele  # Dementia # MDD - cont home aricept, celexa  # T2DM Here glucose wnl. On low dose lantus at home - hold home lantus, will monitor fasting sugars, likely add oral medication if elevated  # Restless leg - cont requip  # Iron deficiency anemia Recent blood transfusion. Hgb stable. No report of bleeding/melena. - cont warfarin - monitor - cont iron - outpt f/u assuming hgb remains stable  # CKD 4 Cr ~2 which is stable - monitor    DVT prophylaxis: home warfarin Code Status: dnr Family Communication: daughter updated telephonically 3/6  Level of care: Med-Surg Status is: inpt  The patient will require care spanning > 2 midnights and should  be moved to inpatient because: Inpatient level of care appropriate due to severity of illness  Dispo: The patient is from: Home              Anticipated d/c is to: SNF              Patient currently is not medically stable to d/c.   Difficult to place patient No        Consultants:  none  Procedures: none  Antimicrobials:  none     Subjective: This morning no complaints. Has appetite. No chest pain or cough. No abd pain. No n/v. Says generalized feeling of weakness unchanged  Objective: Vitals:   12/27/20 0010 12/27/20 0506 12/27/20 0508 12/27/20 0514  BP: (!) 195/68 (!) 210/39 113/86   Pulse: (!) 59 (!) 48 (!) 134 81  Resp:  19    Temp:  97.6 F (36.4 C)    TempSrc:  Oral    SpO2:  99%    Weight:      Height:        Intake/Output Summary (Last 24 hours) at 12/27/2020 0810 Last data filed at 12/26/2020 1137 Gross per 24 hour  Intake --  Output 50 ml  Net -50 ml   Filed Weights   12/23/20 1034 12/25/20 0409 12/26/20 0101  Weight: 59 kg 51.9 kg 53.7 kg    Examination:  General exam: Appears calm and comfortable. adentous Respiratory system: few scattered rhonchi. Respiratory effort normal. Cardiovascular system: irreg irreg, faint systolic murmur Gastrointestinal system: Abdomen is nondistended, soft and nontender. No organomegaly or masses felt. Normal bowel sounds heard. Central nervous system: Alert and oriented. No focal neurological deficits. Extremities: Symmetric 5 x 5 power. Skin: No rashes, lesions or ulcers Psychiatry: Judgement and insight appear normal. Mood & affect appropriate.     Data Reviewed: I have personally reviewed following labs and imaging studies  CBC: Recent Labs  Lab 12/23/20 1040 12/24/20 0456 12/25/20 0510 12/26/20 0539 12/27/20 0530  WBC 6.8 8.0 8.0 8.5 9.0  HGB 8.7* 9.1* 9.3* 9.8* 9.1*  HCT 28.8* 29.4* 30.6* 31.4* 30.1*  MCV 80.9 79.7* 80.5 80.5 81.6  PLT 221 215 218 198 734   Basic Metabolic Panel: Recent Labs  Lab 12/23/20 1040 12/24/20 0841 12/25/20 0510 12/26/20 0539 12/27/20 0530  NA 137 140 139 139 137  K 4.1 4.2 4.2 4.0 4.3  CL 108 110 109 109 106  CO2 22 22 24 24 23   GLUCOSE 187* 115* 133* 138* 129*  BUN 17 17 19 21 23   CREATININE 2.02* 1.84* 1.95* 2.02* 2.05*  CALCIUM 8.3* 8.1* 8.4* 8.5* 8.2*   GFR: Estimated Creatinine  Clearance: 16.2 mL/min (A) (by C-G formula based on SCr of 2.05 mg/dL (H)). Liver Function Tests: Recent Labs  Lab 12/24/20 0841  AST 15  ALT 9  ALKPHOS 79  BILITOT 0.9  PROT 6.1*  ALBUMIN 2.2*   No results for input(s): LIPASE, AMYLASE in the last 168 hours. No results for input(s): AMMONIA in the last 168 hours. Coagulation Profile: Recent Labs  Lab 12/23/20 1507 12/24/20 0456 12/25/20 0510 12/26/20 0539 12/27/20 0530  INR 1.4* 1.4* 1.5* 1.8* 2.0*   Cardiac Enzymes: Recent Labs  Lab 12/24/20 0841  CKTOTAL 52   BNP (last 3 results) No results for input(s): PROBNP in the last 8760 hours. HbA1C: No results for input(s): HGBA1C in the last 72 hours. CBG: Recent Labs  Lab 12/25/20 0745 12/25/20 1131 12/25/20 1627 12/25/20 2056 12/26/20 0805  GLUCAP  135* 163* 118* 122* 138*   Lipid Profile: No results for input(s): CHOL, HDL, LDLCALC, TRIG, CHOLHDL, LDLDIRECT in the last 72 hours. Thyroid Function Tests: Recent Labs    12/24/20 0841  TSH 2.913   Anemia Panel: Recent Labs    12/24/20 0841 12/25/20 0510  VITAMINB12 151*  --   FOLATE  --  10.9   Urine analysis:    Component Value Date/Time   COLORURINE YELLOW (A) 12/23/2020 1040   APPEARANCEUR HAZY (A) 12/23/2020 1040   APPEARANCEUR Cloudy (A) 03/24/2020 0000   LABSPEC 1.013 12/23/2020 1040   PHURINE 6.0 12/23/2020 1040   GLUCOSEU 150 (A) 12/23/2020 1040   HGBUR MODERATE (A) 12/23/2020 1040   BILIRUBINUR NEGATIVE 12/23/2020 1040   BILIRUBINUR Negative 03/24/2020 0000   KETONESUR NEGATIVE 12/23/2020 1040   PROTEINUR >=300 (A) 12/23/2020 1040   UROBILINOGEN 0.2 09/02/2019 1441   NITRITE NEGATIVE 12/23/2020 1040   LEUKOCYTESUR NEGATIVE 12/23/2020 1040   Sepsis Labs: @LABRCNTIP (procalcitonin:4,lacticidven:4)  ) Recent Results (from the past 240 hour(s))  Urine Culture     Status: Abnormal   Collection Time: 12/23/20 10:40 AM   Specimen: Urine, Random  Result Value Ref Range Status   Specimen  Description   Final    URINE, RANDOM Performed at [facility], [address] [place], [place] [address]    Special Requests   Final    NONE Performed at [facility], [address] [place], [place] [address]    Culture MULTIPLE SPECIES PRESENT, SUGGEST RECOLLECTION (A)  Final   Report Status 12/25/2020 FINAL  Final  Resp Panel by RT-PCR (Flu A&B, Covid) Nasopharyngeal Swab     Status: None   Collection Time: 12/23/20 12:52 PM   Specimen: Nasopharyngeal Swab; Nasopharyngeal(NP) swabs in vial transport medium  Result Value Ref Range Status   SARS Coronavirus 2 by RT PCR NEGATIVE NEGATIVE Final    Comment: (NOTE) SARS-CoV-2 target nucleic acids are NOT DETECTED.  The SARS-CoV-2 RNA is generally detectable in upper respiratory specimens during the acute phase of infection. The lowest concentration of SARS-CoV-2 viral copies this assay can detect is 138 copies/mL. A negative result does not preclude SARS-Cov-2 infection and should not be used as the sole basis for treatment or other patient management decisions. A negative result may occur with  improper specimen collection/handling, submission of specimen other than nasopharyngeal swab, presence of viral mutation(s) within the areas targeted by this assay, and inadequate number of viral copies(<138 copies/mL). A negative result must be combined with clinical observations, patient history, and epidemiological information. The expected result is Negative.  Fact Sheet for Patients:  EntrepreneurPulse.com.au  Fact Sheet for Healthcare Providers:  IncredibleEmployment.be  This test is no t yet approved or cleared by the [place] FDA and  has been authorized for detection and/or diagnosis of SARS-CoV-2 by FDA under an Emergency Use Authorization (EUA). This EUA will remain  in effect (meaning this test can be used) for the duration of the COVID-19 declaration under  Section 564(b)(1) of the Act, 21 U.S.C.section 360bbb-3(b)(1), unless the authorization is terminated  or revoked sooner.       Influenza A by PCR NEGATIVE NEGATIVE Final   Influenza B by PCR NEGATIVE NEGATIVE Final    Comment: (NOTE) The Xpert Xpress SARS-CoV-2/FLU/RSV plus assay is intended as an aid in the diagnosis of influenza from Nasopharyngeal swab specimens and should not be used as a sole basis for treatment. Nasal washings and aspirates are unacceptable for Xpert Xpress SARS-CoV-2/FLU/RSV  testing.  Fact Sheet for Patients: EntrepreneurPulse.com.au  Fact Sheet for Healthcare Providers: IncredibleEmployment.be  This test is not yet approved or cleared by the [place] FDA and has been authorized for detection and/or diagnosis of SARS-CoV-2 by FDA under an Emergency Use Authorization (EUA). This EUA will remain in effect (meaning this test can be used) for the duration of the COVID-19 declaration under Section 564(b)(1) of the Act, 21 U.S.C. section 360bbb-3(b)(1), unless the authorization is terminated or revoked.  Performed at [facility], [address], [place], [place] [address]          Radiology Studies: No results found.      Scheduled Meds: .  stroke: mapping our early stages of recovery book   Does not apply Once  . atorvastatin  10 mg Oral QPM  . citalopram  40 mg Oral Daily  . digoxin  0.125 mg Oral Daily  . docusate sodium  200 mg Oral Daily  . donepezil  10 mg Oral QHS  . ferrous sulfate  325 mg Oral Q breakfast  . lisinopril  20 mg Oral Daily  . pantoprazole  40 mg Oral BID  . rOPINIRole  0.5 mg Oral QPM  . vitamin B-12  1,000 mcg Oral Daily  . Warfarin - Pharmacist Dosing Inpatient   Does not apply q1600   Continuous Infusions: . sodium chloride 10 mL/hr at 12/24/20 0006     LOS: 3 days    Time spent: 25 min    Desma Maxim, MD [facility]   If 7PM-7AM, please  contact night-coverage [email or web address] Password [email or web address] 12/27/2020, 8:10 AM

## 2020-12-27 NOTE — Progress Notes (Signed)
   12/27/20 0818  Assess: MEWS Score  Temp 98.1 F (36.7 C)  BP (!) 204/67  Pulse Rate (!) 58  Resp 16  SpO2 97 %  O2 Device Room Air  Assess: MEWS Score  MEWS Temp 0  MEWS Systolic 2  MEWS Pulse 0  MEWS RR 0  MEWS LOC 0  MEWS Score 2  MEWS Score Color Yellow  Assess: if the MEWS score is Yellow or Red  Were vital signs taken at a resting state? Yes  Focused Assessment No change from prior assessment  Early Detection of Sepsis Score *See Row Information* Low  MEWS guidelines implemented *See Row Information* No, previously yellow, continue vital signs every 4 hours  Treat  MEWS Interventions Administered prn meds/treatments    Patient's MEWS previously yellow in last 24h due to elevated BP. Agricultural consultant and provider made aware via face-to-face contact. Administered PRN BP med. See MAR.

## 2020-12-27 NOTE — Care Management Important Message (Signed)
Important Message  Patient Details  Name: CATELYN FRIEL MRN: [id number] Date of Birth: [birth year]   Medicare Important Message Given:  Yes     Dannette Barbara 12/27/2020, 12:28 PM

## 2020-12-28 ENCOUNTER — Inpatient Hospital Stay: Payer: Medicare HMO

## 2020-12-28 DIAGNOSIS — I160 Hypertensive urgency: Secondary | ICD-10-CM | POA: Diagnosis not present

## 2020-12-28 LAB — BASIC METABOLIC PANEL
Anion gap: 7 (ref 5–15)
BUN: 26 mg/dL — ABNORMAL HIGH (ref 8–23)
CO2: 23 mmol/L (ref 22–32)
Calcium: 8.3 mg/dL — ABNORMAL LOW (ref 8.9–10.3)
Chloride: 107 mmol/L (ref 98–111)
Creatinine, Ser: 2.19 mg/dL — ABNORMAL HIGH (ref 0.44–1.00)
GFR, Estimated: 22 mL/min — ABNORMAL LOW (ref >60)
Glucose, Bld: 124 mg/dL — ABNORMAL HIGH (ref 70–99)
Potassium: 3.9 mmol/L (ref 3.5–5.1)
Sodium: 137 mmol/L (ref 135–145)

## 2020-12-28 LAB — PROTIME-INR
INR: 2.4 — ABNORMAL HIGH (ref 0.8–1.2)
Prothrombin Time: 25.2 seconds — ABNORMAL HIGH (ref 11.4–15.2)

## 2020-12-28 MED ORDER — WARFARIN SODIUM 2.5 MG PO TABS
2.5000 mg | ORAL_TABLET | Freq: Once | ORAL | Status: AC
  Administered 2020-12-28: 2.5 mg via ORAL
  Filled 2020-12-28: qty 1

## 2020-12-28 MED ORDER — HYDRALAZINE HCL 25 MG PO TABS
25.0000 mg | ORAL_TABLET | Freq: Three times a day (TID) | ORAL | Status: DC
  Administered 2020-12-28 – 2020-12-29 (×3): 25 mg via ORAL
  Filled 2020-12-28 (×3): qty 1

## 2020-12-28 NOTE — Progress Notes (Signed)
PROGRESS NOTE    [name]  JEH:[id number] DOB: 26-Dec-1940 DOA: 12/23/2020 PCP: Lavera Guise, MD      Brief Narrative:   : [name] is a 80 y.o. female with medical history significant for atrial fibrillation on chronic anticoagulation therapy with Coumadin, diabetes mellitus, hypertension, chronic diastolic dysfunction CHF, nicotine dependence, GERD and dyslipidemia who presents to the emergency room for evaluation of generalized weakness which he has had for about 2 weeks.  Patient states that she was transfused about 2 weeks ago and since then she has had progressively worsening weakness.  She denies having any falls and denies feeling dizzy or lightheaded.  EMS was called today because she was unable to get up due to the severity of the weakness.  She is unable to tell me the color of her stools but denies having any hematemesis or hematuria.  She denies any NSAID use.  She complains of a headache but denies having any blurred vision or difficulty swallowing.  She has no focal deficits and is able to move all extremities. She denies having any chest pain, no shortness of breath, no nausea, no vomiting, no palpitations, no diaphoresis, no urinary frequency, no nocturia, no dysuria, no abdominal pain, no constipation, no diarrhea, no cough, no fever or chills.   Assessment & Plan:   Principal Problem:   Hypertensive urgency Active Problems:   GERD (gastroesophageal reflux disease)   Chronic combined systolic and diastolic heart failure (HCC)   Atrial fibrillation (HCC)   CKD stage 4 due to type 2 diabetes mellitus (HCC)   Nicotine dependence   History of anemia due to chronic kidney disease   Weakness  # Generalized weakness and fatigue # Debility Several week history of this. Nothing focal on history or exam. Recent blood transfusion for iron deficiency anemia thought to be 2/2 ckd, hgb is improved (9s) and stable. No electrolyte abnormalities. CT/MRI of head w/o acute  findings. CXR nothing acute. Non-focal exam. Suspect this is dementia-related decline. ua not strongly suggestive of infection and culture is multiple species. b12 is low. Ck, lfts, tsh wnl. TTE wnl. Daughter most concerned about patient's relatively new gait dysfunction. MRI of cervical and lumbar spine unrevealing, no evidence meylopathy. With rest tremor and cogwheeling on exam question parkinsons - PT/OT advising snf, pt and daughter agreeable, care mgmt pursuing, has bed offer, pending insurance authorization - outpt neurology f/u  # B12 deficiency Folate wnl - started b12  # Hypertensive urgency CT/MRI neg. Kidney function bumped today to 2.19 from baseline of ~2. BPs remain elevated  - have started amlodipine 10 qd - have started lisinopril 20 qd, monitor kidney function closely - will make hydral standing - home statin  # Chronic atrial fibrillation Rate controlled on digoxin which is therapeutic. INR subtherapeutic initially, now therapeutic. - cont home digoxin, warfarin per pharmacy consult - tele  # Dementia # MDD - cont home aricept, celexa  # T2DM Here glucose wnl. On low dose lantus at home - hold home lantus, fasting sugars remain normal to mildly elevated  # Restless leg - cont requip  # Iron deficiency anemia Recent blood transfusion. Hgb stable. No report of bleeding/melena. - cont warfarin - monitor - cont iron - outpt f/u assuming hgb remains stable  # CKD 4 Cr ~2 which is stable, slight increase to 2.19 today - monitor    DVT prophylaxis: home warfarin Code Status: dnr Family Communication: daughter updated telephonically 3/8  Level of care: Med-Surg  Status is: inpt  The patient will require care spanning > 2 midnights and should be moved to inpatient because: Inpatient level of care appropriate due to severity of illness  Dispo: The patient is from: Home              Anticipated d/c is to: SNF              Patient currently is not  medically stable to d/c.   Difficult to place patient No        Consultants:  none  Procedures: none  Antimicrobials:  none    Subjective: This morning no complaints. Has appetite. No chest pain or cough. No abd pain. No n/v. Says generalized feeling of weakness unchanged   Objective: Vitals:   12/28/20 0750 12/28/20 0816 12/28/20 1018 12/28/20 1159  BP: (!) 190/60 (!) 168/96  (!) 186/47  Pulse: 65  72 61  Resp: 18   18  Temp: 97.6 F (36.4 C)   97.8 F (36.6 C)  TempSrc:    Oral  SpO2: 97%   98%  Weight:      Height:        Intake/Output Summary (Last 24 hours) at 12/28/2020 1308 Last data filed at 12/28/2020 1046 Gross per 24 hour  Intake 240 ml  Output 1201 ml  Net -961 ml   Filed Weights   12/23/20 1034 12/25/20 0409 12/26/20 0101  Weight: 59 kg 51.9 kg 53.7 kg    Examination:  General exam: Appears calm and comfortable. adentous Respiratory system: few scattered rhonchi. Respiratory effort normal. Cardiovascular system: irreg irreg, faint systolic murmur Gastrointestinal system: Abdomen is nondistended, soft and nontender. No organomegaly or masses felt. Normal bowel sounds heard. Central nervous system: Alert and oriented. No focal neurological deficits. Rest tremor, cogwheeling r arm Extremities: Symmetric 5 x 5 power. Skin: No rashes, lesions or ulcers Psychiatry: Judgement and insight appear normal. Mood & affect appropriate.     Data Reviewed: I have personally reviewed following labs and imaging studies  CBC: Recent Labs  Lab 12/23/20 1040 12/24/20 0456 12/25/20 0510 12/26/20 0539 12/27/20 0530  WBC 6.8 8.0 8.0 8.5 9.0  HGB 8.7* 9.1* 9.3* 9.8* 9.1*  HCT 28.8* 29.4* 30.6* 31.4* 30.1*  MCV 80.9 79.7* 80.5 80.5 81.6  PLT 221 215 218 198 732   Basic Metabolic Panel: Recent Labs  Lab 12/24/20 0841 12/25/20 0510 12/26/20 0539 12/27/20 0530 12/28/20 0541  NA 140 139 139 137 137  K 4.2 4.2 4.0 4.3 3.9  CL 110 109 109 106 107  CO2  22 24 24 23 23   GLUCOSE 115* 133* 138* 129* 124*  BUN 17 19 21 23  26*  CREATININE 1.84* 1.95* 2.02* 2.05* 2.19*  CALCIUM 8.1* 8.4* 8.5* 8.2* 8.3*   GFR: Estimated Creatinine Clearance: 15.1 mL/min (A) (by C-G formula based on SCr of 2.19 mg/dL (H)). Liver Function Tests: Recent Labs  Lab 12/24/20 0841  AST 15  ALT 9  ALKPHOS 79  BILITOT 0.9  PROT 6.1*  ALBUMIN 2.2*   No results for input(s): LIPASE, AMYLASE in the last 168 hours. No results for input(s): AMMONIA in the last 168 hours. Coagulation Profile: Recent Labs  Lab 12/24/20 0456 12/25/20 0510 12/26/20 0539 12/27/20 0530 12/28/20 0541  INR 1.4* 1.5* 1.8* 2.0* 2.4*   Cardiac Enzymes: Recent Labs  Lab 12/24/20 0841  CKTOTAL 52   BNP (last 3 results) No results for input(s): PROBNP in the last 8760 hours. HbA1C: No results for input(s):  HGBA1C in the last 72 hours. CBG: Recent Labs  Lab 12/25/20 0745 12/25/20 1131 12/25/20 1627 12/25/20 2056 12/26/20 0805  GLUCAP 135* 163* 118* 122* 138*   Lipid Profile: No results for input(s): CHOL, HDL, LDLCALC, TRIG, CHOLHDL, LDLDIRECT in the last 72 hours. Thyroid Function Tests: No results for input(s): TSH, T4TOTAL, FREET4, T3FREE, THYROIDAB in the last 72 hours. Anemia Panel: No results for input(s): VITAMINB12, FOLATE, FERRITIN, TIBC, IRON, RETICCTPCT in the last 72 hours. Urine analysis:    Component Value Date/Time   COLORURINE YELLOW (A) 12/23/2020 1040   APPEARANCEUR HAZY (A) 12/23/2020 1040   APPEARANCEUR Cloudy (A) 03/24/2020 0000   LABSPEC 1.013 12/23/2020 1040   PHURINE 6.0 12/23/2020 1040   GLUCOSEU 150 (A) 12/23/2020 1040   HGBUR MODERATE (A) 12/23/2020 1040   BILIRUBINUR NEGATIVE 12/23/2020 1040   BILIRUBINUR Negative 03/24/2020 0000   KETONESUR NEGATIVE 12/23/2020 1040   PROTEINUR >=300 (A) 12/23/2020 1040   UROBILINOGEN 0.2 09/02/2019 1441   NITRITE NEGATIVE 12/23/2020 1040   LEUKOCYTESUR NEGATIVE 12/23/2020 1040   Sepsis  Labs: @LABRCNTIP (procalcitonin:4,lacticidven:4)  ) Recent Results (from the past 240 hour(s))  Urine Culture     Status: Abnormal   Collection Time: 12/23/20 10:40 AM   Specimen: Urine, Random  Result Value Ref Range Status   Specimen Description   Final    URINE, RANDOM Performed at [facility], [address] [place], [place] [address]    Special Requests   Final    NONE Performed at [facility], [address], [place], [place] [address]    Culture MULTIPLE SPECIES PRESENT, SUGGEST RECOLLECTION (A)  Final   Report Status 12/25/2020 FINAL  Final  Resp Panel by RT-PCR (Flu A&B, Covid) Nasopharyngeal Swab     Status: None   Collection Time: 12/23/20 12:52 PM   Specimen: Nasopharyngeal Swab; Nasopharyngeal(NP) swabs in vial transport medium  Result Value Ref Range Status   SARS Coronavirus 2 by RT PCR NEGATIVE NEGATIVE Final    Comment: (NOTE) SARS-CoV-2 target nucleic acids are NOT DETECTED.  The SARS-CoV-2 RNA is generally detectable in upper respiratory specimens during the acute phase of infection. The lowest concentration of SARS-CoV-2 viral copies this assay can detect is 138 copies/mL. A negative result does not preclude SARS-Cov-2 infection and should not be used as the sole basis for treatment or other patient management decisions. A negative result may occur with  improper specimen collection/handling, submission of specimen other than nasopharyngeal swab, presence of viral mutation(s) within the areas targeted by this assay, and inadequate number of viral copies(<138 copies/mL). A negative result must be combined with clinical observations, patient history, and epidemiological information. The expected result is Negative.  Fact Sheet for Patients:  EntrepreneurPulse.com.au  Fact Sheet for Healthcare Providers:  IncredibleEmployment.be  This test is no t yet approved or cleared by the [place] FDA  and  has been authorized for detection and/or diagnosis of SARS-CoV-2 by FDA under an Emergency Use Authorization (EUA). This EUA will remain  in effect (meaning this test can be used) for the duration of the COVID-19 declaration under Section 564(b)(1) of the Act, 21 U.S.C.section 360bbb-3(b)(1), unless the authorization is terminated  or revoked sooner.       Influenza A by PCR NEGATIVE NEGATIVE Final   Influenza B by PCR NEGATIVE NEGATIVE Final    Comment: (NOTE) The Xpert Xpress SARS-CoV-2/FLU/RSV plus assay is intended as an aid in the diagnosis of influenza from Nasopharyngeal swab specimens and should not be  used as a sole basis for treatment. Nasal washings and aspirates are unacceptable for Xpert Xpress SARS-CoV-2/FLU/RSV testing.  Fact Sheet for Patients: EntrepreneurPulse.com.au  Fact Sheet for Healthcare Providers: IncredibleEmployment.be  This test is not yet approved or cleared by the [place] FDA and has been authorized for detection and/or diagnosis of SARS-CoV-2 by FDA under an Emergency Use Authorization (EUA). This EUA will remain in effect (meaning this test can be used) for the duration of the COVID-19 declaration under Section 564(b)(1) of the Act, 21 U.S.C. section 360bbb-3(b)(1), unless the authorization is terminated or revoked.  Performed at [facility], [address], [place], [place] [address]          Radiology Studies: MR CERVICAL SPINE WO CONTRAST  Result Date: 12/28/2020 CLINICAL DATA:  Weakness, greatest in the lower extremities. EXAM: MRI CERVICAL SPINE WITHOUT CONTRAST TECHNIQUE: Multiplanar, multisequence MR imaging of the cervical spine was performed. No intravenous contrast was administered. COMPARISON:  None. FINDINGS: Alignment: Normal Vertebrae: Normal Cord: Normal signal and morphology. Posterior Fossa, vertebral arteries, paraspinal tissues: Negative. Disc levels: C1-2:  Unremarkable. C2-3: Normal disc space and facet joints. There is no spinal canal stenosis. No neural foraminal stenosis. C3-4: Normal disc space and facet joints. There is no spinal canal stenosis. No neural foraminal stenosis. C4-5: Small disc bulge. Mild spinal canal stenosis. Mild left neural foraminal stenosis. C5-6: Small disc bulge with left-greater-than-right uncovertebral spurring. There is no spinal canal stenosis. No neural foraminal stenosis. C6-7: Normal disc space and facet joints. There is no spinal canal stenosis. No neural foraminal stenosis. C7-T1: Normal disc space and facet joints. There is no spinal canal stenosis. No neural foraminal stenosis. IMPRESSION: 1. Mild C4-5 spinal canal stenosis and left neural foraminal stenosis. 2. Mild C5-6 neural foraminal stenosis. Electronically Signed   By: Ulyses Jarred M.D.   On: 12/28/2020 01:50   MR LUMBAR SPINE WO CONTRAST  Result Date: 12/28/2020 CLINICAL DATA:  Low back pain EXAM: MRI LUMBAR SPINE WITHOUT CONTRAST TECHNIQUE: Multiplanar, multisequence MR imaging of the lumbar spine was performed. No intravenous contrast was administered. COMPARISON:  None. FINDINGS: Segmentation:  Normal Alignment:  Physiologic Vertebrae: No fracture, evidence of discitis, or bone lesion. L3 hemangioma. Conus medullaris and cauda equina: Conus extends to the L1 level. Conus and cauda equina appear normal. Paraspinal and other soft tissues: Absent right kidney Disc levels: T12-L1: Disc space narrowing without spinal canal or neural foraminal stenosis. L1-L2: Minimal disc bulge. Normal facets. No spinal canal stenosis. No neural foraminal stenosis. L2-L3: Small disc bulge with normal facets. No spinal canal stenosis. No neural foraminal stenosis. L3-L4: Normal disc space and facet joints. No spinal canal stenosis. No neural foraminal stenosis. L4-L5: Moderate facet hypertrophy with small disc bulge. No spinal canal stenosis. Mild bilateral neural foraminal stenosis.  L5-S1: Severe facet hypertrophy with minimal disc bulge. No spinal canal stenosis. Mild bilateral neural foraminal stenosis. Visualized sacrum: Normal. IMPRESSION: 1. Severe facet arthrosis at L5-S1 and moderate facet arthrosis at L4-L5 with mild bilateral neural foraminal stenosis at both levels. 2. No spinal canal stenosis. Electronically Signed   By: Ulyses Jarred M.D.   On: 12/28/2020 02:15        Scheduled Meds: .  stroke: mapping our early stages of recovery book   Does not apply Once  . amLODipine  10 mg Oral Daily  . atorvastatin  10 mg Oral QPM  . citalopram  40 mg Oral Daily  . digoxin  0.125 mg Oral Daily  . docusate  sodium  200 mg Oral Daily  . donepezil  10 mg Oral QHS  . ferrous sulfate  325 mg Oral Q breakfast  . lisinopril  20 mg Oral Daily  . pantoprazole  40 mg Oral BID  . rOPINIRole  0.5 mg Oral QPM  . vitamin B-12  1,000 mcg Oral Daily  . warfarin  2.5 mg Oral ONCE-1600  . Warfarin - Pharmacist Dosing Inpatient   Does not apply q1600   Continuous Infusions: . sodium chloride 10 mL/hr at 12/24/20 0006     LOS: 4 days    Time spent: 25 min    Desma Maxim, MD [facility]   If 7PM-7AM, please contact night-coverage [email or web address] Password [email or web address] 12/28/2020, 1:08 PM

## 2020-12-28 NOTE — Consult Note (Signed)
ANTICOAGULATION CONSULT NOTE  Pharmacy Consult for Warfarin Indication: atrial fibrillation  Patient Measurements: Height: 4\' 10"  (147.3 cm) Weight: 53.7 kg (118 lb 4.8 oz) IBW/kg (Calculated) : 40.9  Labs: Recent Labs    12/26/20 0539 12/27/20 0530 12/28/20 0541  HGB 9.8* 9.1*  --   HCT 31.4* 30.1*  --   PLT 198 201  --   LABPROT 20.1* 22.4* 25.2*  INR 1.8* 2.0* 2.4*  CREATININE 2.02* 2.05* 2.19*    Estimated Creatinine Clearance: 15.1 mL/min (A) (by C-G formula based on SCr of 2.19 mg/dL (H)).   Medications:  Warfarin 2mg  QD PTA No other APT  DDIs: APAP prn, Celexa & tramadol, ropinirole, digoxin  Assessment: 80 y.o. female PMH of Afib (on coumadin INR 2-3), DM, HTN, diaCHF, nicotine dependence, GERD and HLD presenting with generalized weakness x2 wks. Pharmacy consulted to manage warfarin while inpatient. Patient has been on Warfarin for years and toward the end of 2021, INR levels have not been stable and supratherapeutic while on 4 mg (1.5 tabs QD except M&F)   INR trends: (INR: 3.2 on 08/02/20, 4.0 on 08/11/20, 5.6 on 09/28/20).   Outpatient provider changed patient dose to 5 mg QD on 11/01/20 and the dose was supratherapeutic (INR 5.7 as of 2/14). Due to this, it was decreased to 2 mg QD on 12/06/20 and levels were subtherapeutic (INR 1.5 as of 3/1).   Baseline:  Hgb 8.7 (anemia of CKD) ; Plts 221   Date INR Dose/Comment 3/03 1.4 Warfarin 3 mg 3/04 1.4 Warfarin 3 mg 3/05 1.5 Warfarin 3.5 mg 3/06 1.8       Warfarin 3 mg  3/07     2.0       Warfarin 3 mg 3/08 2.4 Warfarin 2.5 mg    DDI: Warfarin/Ropinirole (ropinerole may increase INR while on warfarin), Warfarin/Celexa (SSRIs may increase bleed risk), Warfarin/Acetominophen (may increase bleed risk), Warfarin/Protonix (may increase INR)   Goal of Therapy:  INR 2-3   Plan:  INR is therapeutic and trending up. Will give warfarin 2.5 mg tonight. Daily INR and CBC at least every 3 days per protocol.    Discharge Recommendation: Recommend discharging the patient on warfarin 2.5 mg daily (~25% increase in pt's home dose). Pt was discharged on 2 mg daily prior to admission and her INR was subtherapeutic. Check INR in 2-3 days.    Oswald Hillock, PharmD, BCPS 12/28/2020,8:11 AM

## 2020-12-29 DIAGNOSIS — R251 Tremor, unspecified: Secondary | ICD-10-CM | POA: Diagnosis not present

## 2020-12-29 DIAGNOSIS — R279 Unspecified lack of coordination: Secondary | ICD-10-CM | POA: Diagnosis not present

## 2020-12-29 DIAGNOSIS — I160 Hypertensive urgency: Principal | ICD-10-CM

## 2020-12-29 DIAGNOSIS — I10 Essential (primary) hypertension: Secondary | ICD-10-CM | POA: Diagnosis not present

## 2020-12-29 DIAGNOSIS — I5042 Chronic combined systolic (congestive) and diastolic (congestive) heart failure: Secondary | ICD-10-CM | POA: Diagnosis not present

## 2020-12-29 DIAGNOSIS — R54 Age-related physical debility: Secondary | ICD-10-CM | POA: Diagnosis not present

## 2020-12-29 DIAGNOSIS — F0390 Unspecified dementia without behavioral disturbance: Secondary | ICD-10-CM | POA: Diagnosis not present

## 2020-12-29 DIAGNOSIS — K5900 Constipation, unspecified: Secondary | ICD-10-CM | POA: Diagnosis not present

## 2020-12-29 DIAGNOSIS — G4700 Insomnia, unspecified: Secondary | ICD-10-CM | POA: Diagnosis not present

## 2020-12-29 DIAGNOSIS — Z7901 Long term (current) use of anticoagulants: Secondary | ICD-10-CM | POA: Diagnosis not present

## 2020-12-29 DIAGNOSIS — M6281 Muscle weakness (generalized): Secondary | ICD-10-CM | POA: Diagnosis not present

## 2020-12-29 DIAGNOSIS — N184 Chronic kidney disease, stage 4 (severe): Secondary | ICD-10-CM | POA: Diagnosis not present

## 2020-12-29 DIAGNOSIS — R5381 Other malaise: Secondary | ICD-10-CM | POA: Diagnosis not present

## 2020-12-29 DIAGNOSIS — I482 Chronic atrial fibrillation, unspecified: Secondary | ICD-10-CM | POA: Diagnosis not present

## 2020-12-29 DIAGNOSIS — G2581 Restless legs syndrome: Secondary | ICD-10-CM | POA: Diagnosis not present

## 2020-12-29 DIAGNOSIS — E538 Deficiency of other specified B group vitamins: Secondary | ICD-10-CM | POA: Diagnosis not present

## 2020-12-29 DIAGNOSIS — I4891 Unspecified atrial fibrillation: Secondary | ICD-10-CM | POA: Diagnosis not present

## 2020-12-29 DIAGNOSIS — F339 Major depressive disorder, recurrent, unspecified: Secondary | ICD-10-CM | POA: Diagnosis not present

## 2020-12-29 DIAGNOSIS — K219 Gastro-esophageal reflux disease without esophagitis: Secondary | ICD-10-CM | POA: Diagnosis not present

## 2020-12-29 DIAGNOSIS — E1122 Type 2 diabetes mellitus with diabetic chronic kidney disease: Secondary | ICD-10-CM | POA: Diagnosis not present

## 2020-12-29 DIAGNOSIS — D631 Anemia in chronic kidney disease: Secondary | ICD-10-CM | POA: Diagnosis not present

## 2020-12-29 DIAGNOSIS — F331 Major depressive disorder, recurrent, moderate: Secondary | ICD-10-CM | POA: Diagnosis not present

## 2020-12-29 LAB — CBC
HCT: 31.7 % — ABNORMAL LOW (ref 36.0–46.0)
Hemoglobin: 9.8 g/dL — ABNORMAL LOW (ref 12.0–15.0)
MCH: 25.1 pg — ABNORMAL LOW (ref 26.0–34.0)
MCHC: 30.9 g/dL (ref 30.0–36.0)
MCV: 81.1 fL (ref 80.0–100.0)
Platelets: 220 10*3/uL (ref 150–400)
RBC: 3.91 MIL/uL (ref 3.87–5.11)
RDW: 23.1 % — ABNORMAL HIGH (ref 11.5–15.5)
WBC: 8.3 10*3/uL (ref 4.0–10.5)
nRBC: 0 % (ref 0.0–0.2)

## 2020-12-29 LAB — BASIC METABOLIC PANEL
Anion gap: 7 (ref 5–15)
BUN: 25 mg/dL — ABNORMAL HIGH (ref 8–23)
CO2: 23 mmol/L (ref 22–32)
Calcium: 8.3 mg/dL — ABNORMAL LOW (ref 8.9–10.3)
Chloride: 107 mmol/L (ref 98–111)
Creatinine, Ser: 2.09 mg/dL — ABNORMAL HIGH (ref 0.44–1.00)
GFR, Estimated: 24 mL/min — ABNORMAL LOW (ref >60)
Glucose, Bld: 129 mg/dL — ABNORMAL HIGH (ref 70–99)
Potassium: 4 mmol/L (ref 3.5–5.1)
Sodium: 137 mmol/L (ref 135–145)

## 2020-12-29 LAB — PROTIME-INR
INR: 2.6 — ABNORMAL HIGH (ref 0.8–1.2)
Prothrombin Time: 27 seconds — ABNORMAL HIGH (ref 11.4–15.2)

## 2020-12-29 MED ORDER — LISINOPRIL 20 MG PO TABS: 20.0000 mg | ORAL_TABLET | Freq: Every day | ORAL | Status: DC

## 2020-12-29 MED ORDER — HYDRALAZINE HCL 100 MG PO TABS: 50.0000 mg | ORAL_TABLET | Freq: Three times a day (TID) | ORAL | Status: DC

## 2020-12-29 MED ORDER — AMLODIPINE BESYLATE 10 MG PO TABS: 10.0000 mg | ORAL_TABLET | Freq: Every day | ORAL | Status: DC

## 2020-12-29 MED ORDER — HYDRALAZINE HCL 25 MG PO TABS: 100.0000 mg | ORAL_TABLET | Freq: Three times a day (TID) | ORAL | Status: DC

## 2020-12-29 MED ORDER — HYDRALAZINE HCL 50 MG PO TABS: 50.0000 mg | ORAL_TABLET | Freq: Three times a day (TID) | ORAL | Status: DC

## 2020-12-29 MED ORDER — HYDRALAZINE HCL 25 MG PO TABS
50.0000 mg | ORAL_TABLET | Freq: Once | ORAL | Status: DC
  Filled 2020-12-29: qty 1

## 2020-12-29 MED ORDER — CYANOCOBALAMIN 1000 MCG PO TABS: 1000.0000 ug | ORAL_TABLET | Freq: Every day | ORAL | Status: DC

## 2020-12-29 MED ORDER — POLYETHYLENE GLYCOL 3350 17 G PO PACK: 17.0000 g | PACK | Freq: Every day | ORAL | 0 refills | Status: DC

## 2020-12-29 MED ORDER — HYDRALAZINE HCL 25 MG PO TABS: 50.0000 mg | ORAL_TABLET | Freq: Three times a day (TID) | ORAL | Status: DC

## 2020-12-29 MED ORDER — DULCOLAX 5 MG PO TBEC: 5.0000 mg | DELAYED_RELEASE_TABLET | Freq: Every day | ORAL | 0 refills | Status: DC | PRN

## 2020-12-29 MED ORDER — FERROUS SULFATE 325 (65 FE) MG PO TABS: 325.0000 mg | ORAL_TABLET | Freq: Every day | ORAL | 3 refills | Status: DC

## 2020-12-29 MED ORDER — HYDRALAZINE HCL 100 MG PO TABS: 100.0000 mg | ORAL_TABLET | Freq: Three times a day (TID) | ORAL | Status: DC

## 2020-12-29 MED ORDER — WARFARIN SODIUM 2.5 MG PO TABS
2.5000 mg | ORAL_TABLET | Freq: Once | ORAL | Status: DC
  Filled 2020-12-29: qty 1

## 2020-12-29 NOTE — TOC Transition Note (Signed)
Transition of Care [facility]) - CM/SW Discharge Note   Patient Details  Name: [name] MRN: [id number] Date of Birth: [birth year]  [facility] of [facility]) CM/SW Contact:  Kerin Salen, RN Phone Number: 12/29/2020, 11:42 AM   Clinical Narrative:  To be discharged to [place]. Daughter notified.    Final next level of care: Skilled Nursing Facility Barriers to Discharge: Barriers Resolved   Patient Goals and CMS Choice Patient states their goals for this hospitalization and ongoing recovery are:: To go to SNF   Choice offered to / list presented to : NA  Discharge Placement                Patient to be transferred to facility by: [facility] Name of family member notified: June Lovena Le, Daughter Patient and family notified of of transfer: 12/29/20  Discharge Plan and Services                DME Arranged: N/A DME Agency: NA       HH Arranged: NA [place] Agency: NA        Social Determinants of Health (SDOH) Interventions     Readmission Risk Interventions No flowsheet data found.

## 2020-12-29 NOTE — Progress Notes (Signed)
After introducing myself to the patient, this writer assisted the patient with her lunch tray. Patient is able to feed herself. Ice cream was opened for patient and food was rearranged on the tray. NAD.

## 2020-12-29 NOTE — Progress Notes (Signed)
[facility] is present.

## 2020-12-29 NOTE — Discharge Summary (Addendum)
[facility] Discharge Summary   Patient: [name] ALP:[id number]  PCP: Lavera Guise, MD  Date of admission: 12/23/2020   Date of discharge:  12/29/2020     Discharge Diagnoses:  Principal Problem:   Hypertensive urgency Active Problems:   GERD (gastroesophageal reflux disease)   Chronic combined systolic and diastolic heart failure (HCC)   Atrial fibrillation ([name])   CKD stage 4 due to type 2 diabetes mellitus (HCC)   Nicotine dependence   History of anemia due to chronic kidney disease   Weakness   Admitted From: Home Disposition:  SNF   Recommendations for Outpatient Follow-up:  1. PCP: To monitor blood pressure and titrate medication accordingly 2. Follow up LABS/TEST: Pete CBC and BMP after 1 to 2 weeks, check renal functions.  Monitor INR   Contact information for after-discharge care    Destination    [facility] .   Service: Skilled Nursing Contact information: [address] [place] [phone]                 Diet recommendation: Cardiac diet  Activity: The patient is advised to gradually reintroduce usual activities, as tolerated  Discharge Condition: stable  Code Status: DNR   History of present illness: As per the H and P dictated on admission. Hospital Course:  Amy A Simpsonis a 80 y.o.femalewith medical history significant foratrial fibrillation on chronic anticoagulation therapy with Coumadin, diabetes mellitus, hypertension, chronic diastolic dysfunction CHF, nicotine dependence, GERD and dyslipidemia who presents to the emergency room for evaluation of generalized weakness which he has had for about 2 weeks. Patient states that she was transfused about 2 weeks ago and since then she has had progressively worsening weakness. She denies having any falls and denies feeling dizzy or lightheaded. EMS was called today because she was unable to get up due to the severity of the  weakness. She is unable to tell me the color of her stools but denies having any hematemesis or hematuria. She denies any NSAIDuse.She complains of a headache but denies having any blurred vision or difficulty swallowing. She has no focal deficits and is able to move all extremities. She denies having any chest pain, no shortness of breath, no nausea, no vomiting, no palpitations, no diaphoresis, no urinary frequency, no nocturia, no dysuria, no abdominal pain, no constipation, no diarrhea, no cough, no fever or chills. Assessment & Plan:  Principal Problem:   Hypertensive urgency Active Problems:   GERD (gastroesophageal reflux disease)   Chronic combined systolic and diastolic heart failure (HCC)   Atrial fibrillation (HCC)   CKD stage 4 due to type 2 diabetes mellitus (HCC)   Nicotine dependence   History of anemia due to chronic kidney disease   Weakness  # Generalized weakness and fatigue, Debility. Several week history of this. Nothing focal on history or exam. Recent blood transfusion for iron deficiency anemia thought to be 2/2 ckd, hgb is improved (9s) and stable. No electrolyte abnormalities. CT/MRI of head w/o acute findings. CXR nothing acute. Non-focal exam. Suspect this is dementia-related decline. ua not strongly suggestive of infection and culture is multiple species. b12 is low. Ck, lfts, tsh wnl. TTE wnl. Daughter most concerned about patient's relatively new gait dysfunction. MRI of cervical and lumbar spine unrevealing, no evidence meylopathy. With rest tremor and cogwheeling on exam question parkinsons. PT/OT advising snf, pt and daughter agreeable, care mgmt pursuing, had bed offer, insurance authorization received today. outpt neurology f/u #  B12 deficiency, Folate wnl, started b12 # Hypertensive urgency, CT/MRI neg. Kidney function bumped 2.19 from baseline of ~2. BPs remain elevated, Cr 2.09 today, repeat BMP after 1 week. started amlodipine 10 qd, started lisinopril  20 qd, monitor kidney function closely and 3/9 increased hydralazine 50 mg p.o. 3 times daily due to persistent high blood pressure.  Monitor BP and titrate medication accordingly.  Continued statin.  # Chronic atrial fibrillation, Rate controlled on digoxin which is therapeutic. INR subtherapeutic initially, now therapeutic. - cont home digoxin, and warfarin, pharmacy consulted for dosing and INR monitoring so continue to monitor INR and dose accordingly. # Dementia, MDD, cont home aricept, celexa.  Continue supportive care and fall precautions # T2DM, Here glucose wnl. On low dose lantus at home. hold home lantus during hospital stay, fasting sugars remain normal to mildly elevated.  At home dose Lantus, monitor fingerstick and titrate dose accordingly. # Restless leg, cont requip # Iron deficiency anemia, Recent blood transfusion. Hgb stable. No report of bleeding/melena. cont warfarin, cont iron, outpt f/u assuming hgb remains stable # CKD 4, Cr ~2 which is stable, slight increase to 2.19--2.09 today Body mass index is 22.34 kg/m.  Nutrition Interventions: Continue to encourage oral intake    Patient was seen by physical therapy, who recommended SNF placement.  On the day of the discharge the patient's vitals were stable, and no other acute medical condition were reported by patient. the patient was felt safe to be discharge at [facility].  Consultants: none Procedures: none  Discharge Exam: General: Appear in no distress, no Rash; Oral Mucosa Clear, moist. Cardiovascular: S1 and S2 Present, no Murmur, Respiratory: normal respiratory effort, Bilateral Air entry present and no Crackles, no wheezes Abdomen: Bowel Sound present, Soft and no tenderness, no hernia Extremities: no Pedal edema, no calf tenderness Neurology: alert and oriented to time, place, and person affect appropriate.  Filed Weights   12/25/20 0409 12/26/20 0101 12/29/20 0428  Weight: 51.9 kg 53.7 kg 48.5 kg   Vitals:    12/29/20 1126 12/29/20 1431  BP: (!) 168/50 (!) 135/46  Pulse: 71 65  Resp: 16 18  Temp: 97.9 F (36.6 C) 98.3 F (36.8 C)  SpO2: 100% 97%    DISCHARGE MEDICATION: Allergies as of 12/29/2020   No Known Allergies     Medication List    TAKE these medications   amLODipine 10 MG tablet Commonly known as: NORVASC Take 1 tablet (10 mg total) by mouth daily. Start taking on: December 30, 2020   atorvastatin 10 MG tablet Commonly known as: LIPITOR Take 1 tablet (10 mg total) by mouth every evening.   citalopram 40 MG tablet Commonly known as: CELEXA Take 1 tablet (40 mg total) by mouth daily.   cyanocobalamin 1000 MCG tablet Take 1 tablet (1,000 mcg total) by mouth daily. Start taking on: December 30, 2020   digoxin 0.125 MG tablet Commonly known as: LANOXIN Take 1 tablet (0.125 mg total) by mouth daily.   donepezil 5 MG tablet Commonly known as: ARICEPT TAKE 1 TABLET BY MOUTH ONCE DAILY FOR MEMORY   Dulcolax 5 MG EC tablet Generic drug: bisacodyl Take 1 tablet (5 mg total) by mouth daily as needed for moderate constipation.   ferrous sulfate 325 (65 FE) MG tablet Take 1 tablet (325 mg total) by mouth daily with breakfast. Start taking on: December 30, 2020   hydrALAZINE 50 MG tablet Commonly known as: APRESOLINE Take 1 tablet (50 mg total) by mouth every 8 (eight)  hours. Hold if SBP less than 130 mmHg   lisinopril 20 MG tablet Commonly known as: ZESTRIL Take 1 tablet (20 mg total) by mouth daily. Start taking on: December 30, 2020   pantoprazole 40 MG tablet Commonly known as: PROTONIX Take 1 tablet (40 mg total) by mouth 2 (two) times daily.   polyethylene glycol 17 g packet Commonly known as: MiraLax Take 17 g by mouth daily.   rOPINIRole 0.5 MG tablet Commonly known as: REQUIP Take 1 tablet (0.5 mg total) by mouth every evening.   Toujeo Max SoloStar 300 UNIT/ML Solostar Pen Generic drug: insulin glargine (2 Unit Dial) Take 6 to 10 units with supper( needs  needles as well e 11.65   warfarin 2 MG tablet Commonly known as: COUMADIN Take 1 tablet (2 mg total) by mouth daily.      No Known Allergies Discharge Instructions    Call MD for:   Complete by: As directed    Uncontrolled blood pressure   Diet - low sodium heart healthy   Complete by: As directed    Increase activity slowly   Complete by: As directed       The results of significant diagnostics from this hospitalization (including imaging, microbiology, ancillary and laboratory) are listed below for reference.    Significant Diagnostic Studies: CT Head Wo Contrast  Result Date: 12/23/2020 CLINICAL DATA:  Generalized weakness. EXAM: CT HEAD WITHOUT CONTRAST TECHNIQUE: Contiguous axial images were obtained from the base of the skull through the vertex without intravenous contrast. COMPARISON:  June 25, 2018. FINDINGS: Brain: Mild diffuse cortical atrophy is noted. Mild chronic ischemic white matter disease is noted. No mass effect or midline shift is noted. Ventricular size is within normal limits. There is no evidence of mass lesion, hemorrhage or acute infarction. Vascular: No hyperdense vessel or unexpected calcification. Skull: Normal. Negative for fracture or focal lesion. Sinuses/Orbits: Right sphenoid sinusitis is noted. Other: None. IMPRESSION: Mild diffuse cortical atrophy. Mild chronic ischemic white matter disease. No acute intracranial abnormality seen. Electronically Signed   By: Marijo Conception M.D.   On: 12/23/2020 14:02   MR BRAIN WO CONTRAST  Result Date: 12/23/2020 CLINICAL DATA:  Initial evaluation for neuro deficit, stroke suspected. EXAM: MRI HEAD WITHOUT CONTRAST TECHNIQUE: Multiplanar, multiecho pulse sequences of the brain and surrounding structures were obtained without intravenous contrast. COMPARISON:  Prior CT from earlier the same day. FINDINGS: Brain: Generalized age-related cerebral atrophy with mild chronic small vessel ischemic disease. No abnormal foci  of restricted diffusion to suggest acute or subacute ischemia. Gray-white matter differentiation maintained. No encephalomalacia to suggest chronic cortical infarction. No acute or chronic intracranial hemorrhage. No mass lesion, midline shift or mass effect. No hydrocephalus or extra-axial fluid collection. Pituitary gland suprasellar region within normal limits. Midline structures intact. Vascular: Major intracranial vascular flow voids are maintained. Skull and upper cervical spine: Craniocervical junction within normal limits. Bone marrow signal intensity normal. No scalp soft tissue abnormality. Sinuses/Orbits: Patient status post bilateral ocular lens replacement. Globes and orbital soft tissues demonstrate no acute finding. Acute right sphenoid sinusitis noted. Paranasal sinuses are otherwise clear. Small bilateral mastoid effusions noted, left greater than right. Visualized nasopharynx within normal limits. Other: None. IMPRESSION: 1. No acute intracranial abnormality. 2. Mild age-related cerebral atrophy with chronic small vessel ischemic disease. 3. Acute right sphenoid sinusitis. Electronically Signed   By: Jeannine Boga M.D.   On: 12/23/2020 21:52   MR CERVICAL SPINE WO CONTRAST  Result Date: 12/28/2020 CLINICAL DATA:  Weakness, greatest in the lower extremities. EXAM: MRI CERVICAL SPINE WITHOUT CONTRAST TECHNIQUE: Multiplanar, multisequence MR imaging of the cervical spine was performed. No intravenous contrast was administered. COMPARISON:  None. FINDINGS: Alignment: Normal Vertebrae: Normal Cord: Normal signal and morphology. Posterior Fossa, vertebral arteries, paraspinal tissues: Negative. Disc levels: C1-2: Unremarkable. C2-3: Normal disc space and facet joints. There is no spinal canal stenosis. No neural foraminal stenosis. C3-4: Normal disc space and facet joints. There is no spinal canal stenosis. No neural foraminal stenosis. C4-5: Small disc bulge. Mild spinal canal stenosis. Mild  left neural foraminal stenosis. C5-6: Small disc bulge with left-greater-than-right uncovertebral spurring. There is no spinal canal stenosis. No neural foraminal stenosis. C6-7: Normal disc space and facet joints. There is no spinal canal stenosis. No neural foraminal stenosis. C7-T1: Normal disc space and facet joints. There is no spinal canal stenosis. No neural foraminal stenosis. IMPRESSION: 1. Mild C4-5 spinal canal stenosis and left neural foraminal stenosis. 2. Mild C5-6 neural foraminal stenosis. Electronically Signed   By: Ulyses Jarred M.D.   On: 12/28/2020 01:50   MR LUMBAR SPINE WO CONTRAST  Result Date: 12/28/2020 CLINICAL DATA:  Low back pain EXAM: MRI LUMBAR SPINE WITHOUT CONTRAST TECHNIQUE: Multiplanar, multisequence MR imaging of the lumbar spine was performed. No intravenous contrast was administered. COMPARISON:  None. FINDINGS: Segmentation:  Normal Alignment:  Physiologic Vertebrae: No fracture, evidence of discitis, or bone lesion. L3 hemangioma. Conus medullaris and cauda equina: Conus extends to the L1 level. Conus and cauda equina appear normal. Paraspinal and other soft tissues: Absent right kidney Disc levels: T12-L1: Disc space narrowing without spinal canal or neural foraminal stenosis. L1-L2: Minimal disc bulge. Normal facets. No spinal canal stenosis. No neural foraminal stenosis. L2-L3: Small disc bulge with normal facets. No spinal canal stenosis. No neural foraminal stenosis. L3-L4: Normal disc space and facet joints. No spinal canal stenosis. No neural foraminal stenosis. L4-L5: Moderate facet hypertrophy with small disc bulge. No spinal canal stenosis. Mild bilateral neural foraminal stenosis. L5-S1: Severe facet hypertrophy with minimal disc bulge. No spinal canal stenosis. Mild bilateral neural foraminal stenosis. Visualized sacrum: Normal. IMPRESSION: 1. Severe facet arthrosis at L5-S1 and moderate facet arthrosis at L4-L5 with mild bilateral neural foraminal stenosis at  both levels. 2. No spinal canal stenosis. Electronically Signed   By: Ulyses Jarred M.D.   On: 12/28/2020 02:15   DG Chest Portable 1 View  Result Date: 12/23/2020 CLINICAL DATA:  Darrick Grinder the breath, weakness EXAM: PORTABLE CHEST 1 VIEW COMPARISON:  01/03/2019 FINDINGS: Heart is normal size. Peribronchial thickening and interstitial prominence throughout the lungs. Low lung volumes. Bibasilar atelectasis. No effusions. No acute bony abnormality. Aortic atherosclerosis. IMPRESSION: Peribronchial thickening and interstitial prominence may reflect bronchitic changes or atypical infection. Bibasilar atelectasis. Electronically Signed   By: Rolm Baptise M.D.   On: 12/23/2020 12:03   ECHOCARDIOGRAM COMPLETE  Result Date: 12/24/2020    ECHOCARDIOGRAM REPORT   Patient Name:   ZIZA HASTINGS Date of Exam: 12/24/2020 Medical Rec #:  [id number]      Height:       58.0 in Accession #:    [id number]     Weight:       130.0 lb Date of Birth:  1941/03/03      BSA:          1.516 m Patient Age:    80 years       BP:           185/65 mmHg Patient Gender:  F              HR:           72 bpm. Exam Location:  [facility] Procedure: 2D Echo, Cardiac Doppler and Color Doppler Indications:     Cardiomyopathy-- Hypertrophic I42.1  History:         Patient has prior history of Echocardiogram examinations, most                  recent 05/11/2020. Arrythmias:Atrial Fibrillation; Risk                  Factors:Hypertension, Dyslipidemia and Diabetes.  Sonographer:     Sherrie Sport RDCS (AE) Referring Phys:  [id number] Collier Bullock Diagnosing Phys: Ida Rogue MD IMPRESSIONS  1. Left ventricular ejection fraction, by estimation, is 60 to 65%. The left ventricle has normal function. The left ventricle has no regional wall motion abnormalities. Left ventricular diastolic parameters are indeterminate.  2. Right ventricular systolic function is normal. The right ventricular size is normal. There is normal pulmonary artery systolic pressure.  3. Left  atrial size was mildly dilated.  4. There is moderate calcification of the aortic valve. Aortic valve regurgitation is mild. Mild aortic valve stenosis.  5. The inferior vena cava is dilated in size with <50% respiratory variability, suggesting right atrial pressure of 15 mmHg.  6. Left pleural effusion noted. FINDINGS  Left Ventricle: Left ventricular ejection fraction, by estimation, is 60 to 65%. The left ventricle has normal function. The left ventricle has no regional wall motion abnormalities. The left ventricular internal cavity size was normal in size. There is  no left ventricular hypertrophy. Left ventricular diastolic parameters are indeterminate. Right Ventricle: The right ventricular size is normal. No increase in right ventricular wall thickness. Right ventricular systolic function is normal. There is normal pulmonary artery systolic pressure. The tricuspid regurgitant velocity is 1.75 m/s, and  with an assumed right atrial pressure of 10 mmHg, the estimated right ventricular systolic pressure is 45.0 mmHg. Left Atrium: Left atrial size was mildly dilated. Right Atrium: Right atrial size was normal in size. Pericardium: There is no evidence of pericardial effusion. Mitral Valve: The mitral valve is normal in structure. Mild mitral annular calcification. No evidence of mitral valve regurgitation. No evidence of mitral valve stenosis. Tricuspid Valve: The tricuspid valve is normal in structure. Tricuspid valve regurgitation is not demonstrated. No evidence of tricuspid stenosis. Aortic Valve: The aortic valve was not well visualized. There is moderate calcification of the aortic valve. Aortic valve regurgitation is mild. Aortic regurgitation PHT measures 598 msec. Mild aortic stenosis is present. Aortic valve mean gradient measures 4.7 mmHg. Aortic valve peak gradient measures 8.1 mmHg. Aortic valve area, by VTI measures 2.00 cm. Pulmonic Valve: The pulmonic valve was normal in structure. Pulmonic valve  regurgitation is not visualized. No evidence of pulmonic stenosis. Aorta: The aortic root is normal in size and structure. Venous: The inferior vena cava is dilated in size with less than 50% respiratory variability, suggesting right atrial pressure of 15 mmHg. IAS/Shunts: No atrial level shunt detected by color flow Doppler.  LEFT VENTRICLE PLAX 2D LVIDd:         4.00 cm LVIDs:         2.70 cm LV PW:         1.56 cm LV IVS:        0.82 cm LVOT diam:     2.00 cm LV SV:  52 LV SV Index:   35 LVOT Area:     3.14 cm  RIGHT VENTRICLE RV Basal diam:  2.54 cm RV S prime:     17.50 cm/s TAPSE (M-mode): 3.3 cm LEFT ATRIUM             Index       RIGHT ATRIUM           Index LA diam:        4.30 cm 2.84 cm/m  RA Area:     18.40 cm LA Vol (A2C):   73.2 ml 48.27 ml/m RA Volume:   46.80 ml  30.86 ml/m LA Vol (A4C):   68.6 ml 45.24 ml/m LA Biplane Vol: 74.7 ml 49.26 ml/m  AORTIC VALVE                   PULMONIC VALVE AV Area (Vmax):    1.81 cm    PV Vmax:        0.87 m/s AV Area (Vmean):   2.01 cm    PV Peak grad:   3.1 mmHg AV Area (VTI):     2.00 cm    RVOT Peak grad: 4 mmHg AV Vmax:           142.67 cm/s AV Vmean:          92.600 cm/s AV VTI:            0.263 m AV Peak Grad:      8.1 mmHg AV Mean Grad:      4.7 mmHg LVOT Vmax:         82.20 cm/s LVOT Vmean:        59.100 cm/s LVOT VTI:          0.167 m LVOT/AV VTI ratio: 0.64 AI PHT:            598 msec  AORTA Ao Root diam: 2.40 cm MITRAL VALVE                TRICUSPID VALVE MV Area (PHT): 5.46 cm     TR Peak grad:   12.2 mmHg MV Decel Time: 139 msec     TR Vmax:        175.00 cm/s MV E velocity: 102.00 cm/s                             SHUNTS                             Systemic VTI:  0.17 m                             Systemic Diam: 2.00 cm Ida Rogue MD Electronically signed by Ida Rogue MD Signature Date/Time: 12/24/2020/8:37:31 PM    Final     Microbiology: Recent Results (from the past 240 hour(s))  Urine Culture     Status: Abnormal    Collection Time: 12/23/20 10:40 AM   Specimen: Urine, Random  Result Value Ref Range Status   Specimen Description   Final    URINE, RANDOM Performed at [facility], [address] [place], [place] [address]    Special Requests   Final    NONE Performed at [facility], [address] [place],  [address]    Culture MULTIPLE SPECIES PRESENT, SUGGEST RECOLLECTION (A)  Final   Report Status 12/25/2020 FINAL  Final  Resp Panel by RT-PCR (Flu A&B, Covid) Nasopharyngeal Swab     Status: None   Collection Time: 12/23/20 12:52 PM   Specimen: Nasopharyngeal Swab; Nasopharyngeal(NP) swabs in vial transport medium  Result Value Ref Range Status   SARS Coronavirus 2 by RT PCR NEGATIVE NEGATIVE Final    Comment: (NOTE) SARS-CoV-2 target nucleic acids are NOT DETECTED.  The SARS-CoV-2 RNA is generally detectable in upper respiratory specimens during the acute phase of infection. The lowest concentration of SARS-CoV-2 viral copies this assay can detect is 138 copies/mL. A negative result does not preclude SARS-Cov-2 infection and should not be used as the sole basis for treatment or other patient management decisions. A negative result may occur with  improper specimen collection/handling, submission of specimen other than nasopharyngeal swab, presence of viral mutation(s) within the areas targeted by this assay, and inadequate number of viral copies(<138 copies/mL). A negative result must be combined with clinical observations, patient history, and epidemiological information. The expected result is Negative.  Fact Sheet for Patients:  EntrepreneurPulse.com.au  Fact Sheet for Healthcare Providers:  IncredibleEmployment.be  This test is no t yet approved or cleared by the [place] FDA and  has been authorized for detection and/or diagnosis of SARS-CoV-2 by FDA under an Emergency Use Authorization (EUA). This EUA will  remain  in effect (meaning this test can be used) for the duration of the COVID-19 declaration under Section 564(b)(1) of the Act, 21 U.S.C.section 360bbb-3(b)(1), unless the authorization is terminated  or revoked sooner.       Influenza A by PCR NEGATIVE NEGATIVE Final   Influenza B by PCR NEGATIVE NEGATIVE Final    Comment: (NOTE) The Xpert Xpress SARS-CoV-2/FLU/RSV plus assay is intended as an aid in the diagnosis of influenza from Nasopharyngeal swab specimens and should not be used as a sole basis for treatment. Nasal washings and aspirates are unacceptable for Xpert Xpress SARS-CoV-2/FLU/RSV testing.  Fact Sheet for Patients: EntrepreneurPulse.com.au  Fact Sheet for Healthcare Providers: IncredibleEmployment.be  This test is not yet approved or cleared by the [place] FDA and has been authorized for detection and/or diagnosis of SARS-CoV-2 by FDA under an Emergency Use Authorization (EUA). This EUA will remain in effect (meaning this test can be used) for the duration of the COVID-19 declaration under Section 564(b)(1) of the Act, 21 U.S.C. section 360bbb-3(b)(1), unless the authorization is terminated or revoked.  Performed at [facility], [address] [place], [place] [address]      Labs: CBC: Recent Labs  Lab 12/24/20 [phone] 12/25/20 0510 12/26/20 0539 12/27/20 0530 12/29/20 0705  WBC 8.0 8.0 8.5 9.0 8.3  HGB 9.1* 9.3* 9.8* 9.1* 9.8*  HCT 29.4* 30.6* 31.4* 30.1* 31.7*  MCV 79.7* 80.5 80.5 81.6 81.1  PLT 215 218 198 201 409   Basic Metabolic Panel: Recent Labs  Lab 12/25/20 0510 12/26/20 0539 12/27/20 0530 12/28/20 0541 12/29/20 0705  NA 139 139 137 137 137  K 4.2 4.0 4.3 3.9 4.0  CL 109 109 106 107 107  CO2 24 24 23 23 23   GLUCOSE 133* 138* 129* 124* 129*  BUN 19 21 23  26* 25*  CREATININE 1.95* 2.02* 2.05* 2.19* 2.09*  CALCIUM 8.4* 8.5* 8.2* 8.3* 8.3*   Liver Function Tests: Recent Labs   Lab 12/24/20 0841  AST 15  ALT 9  ALKPHOS 79  BILITOT 0.9  PROT 6.1*  ALBUMIN 2.2*   No results for input(s): LIPASE,  AMYLASE in the last 168 hours. No results for input(s): AMMONIA in the last 168 hours. Cardiac Enzymes: Recent Labs  Lab 12/24/20 0841  CKTOTAL 52   BNP (last 3 results) No results for input(s): BNP in the last 8760 hours. CBG: Recent Labs  Lab 12/25/20 0745 12/25/20 1131 12/25/20 1627 12/25/20 2056 12/26/20 0805  GLUCAP 135* 163* 118* 122* 138*    Time spent: 35 minutes  Signed:  Val Riles  [facility]  12/29/2020 3:28 PM

## 2020-12-29 NOTE — Plan of Care (Signed)
  Problem: Education: Goal: Knowledge of General Education information will improve Description: Including pain rating scale, medication(s)/side effects and non-pharmacologic comfort measures Outcome: Adequate for Discharge   Problem: Clinical Measurements: Goal: Ability to maintain clinical measurements within normal limits will improve Outcome: Adequate for Discharge   Problem: Activity: Goal: Risk for activity intolerance will decrease Outcome: Adequate for Discharge   Problem: Nutrition: Goal: Adequate nutrition will be maintained Outcome: Adequate for Discharge   Problem: Safety: Goal: Ability to remain free from injury will improve Outcome: Adequate for Discharge   Problem: Skin Integrity: Goal: Risk for impaired skin integrity will decrease Outcome: Adequate for Discharge   Problem: Education: Goal: Knowledge of disease or condition will improve Outcome: Adequate for Discharge Goal: Knowledge of secondary prevention will improve Outcome: Adequate for Discharge Goal: Knowledge of patient specific risk factors addressed and post discharge goals established will improve Outcome: Adequate for Discharge Goal: Individualized Educational Video(s) Outcome: Adequate for Discharge   Problem: Coping: Goal: Will verbalize positive feelings about self Outcome: Adequate for Discharge Goal: Will identify appropriate support needs Outcome: Adequate for Discharge   Problem: Self-Care: Goal: Ability to participate in self-care as condition permits will improve Outcome: Adequate for Discharge   Problem: Nutrition: Goal: Risk of aspiration will decrease Outcome: Adequate for Discharge

## 2020-12-29 NOTE — Consult Note (Signed)
ANTICOAGULATION CONSULT NOTE  Pharmacy Consult for Warfarin Indication: atrial fibrillation  Patient Measurements: Height: 4\' 10"  (147.3 cm) Weight: 48.5 kg (106 lb 14.4 oz) IBW/kg (Calculated) : 40.9  Labs: Recent Labs    12/27/20 0530 12/28/20 0541 12/29/20 0705  HGB 9.1*  --  9.8*  HCT 30.1*  --  31.7*  PLT 201  --  220  LABPROT 22.4* 25.2* 27.0*  INR 2.0* 2.4* 2.6*  CREATININE 2.05* 2.19* 2.09*    Estimated Creatinine Clearance: 14.1 mL/min (A) (by C-G formula based on SCr of 2.09 mg/dL (H)).   Medications:  Warfarin 2mg  QD PTA No other APT  DDIs: APAP prn, Celexa & tramadol, ropinirole, digoxin  Assessment: 80 y.o. female PMH of Afib (on coumadin INR 2-3), DM, HTN, diaCHF, nicotine dependence, GERD and HLD presenting with generalized weakness x2 wks. Pharmacy consulted to manage warfarin while inpatient. Patient has been on Warfarin for years and toward the end of 2021, INR levels have not been stable and supratherapeutic while on 4 mg (1.5 tabs QD except M&F)   INR trends: (INR: 3.2 on 08/02/20, 4.0 on 08/11/20, 5.6 on 09/28/20).   Outpatient provider changed patient dose to 5 mg QD on 11/01/20 and the dose was supratherapeutic (INR 5.7 as of 2/14). Due to this, it was decreased to 2 mg QD on 12/06/20 and levels were subtherapeutic (INR 1.5 as of 3/1).   Baseline:  Hgb 8.7 (anemia of CKD) ; Plts 221 Hgb and Plts remain stable as of 3/9     Date INR Dose/Comment 3/03 1.4 Warfarin 3 mg 3/04 1.4 Warfarin 3 mg 3/05 1.5 Warfarin 3.5 mg 3/06 1.8       Warfarin 3 mg  3/07     2.0       Warfarin 3 mg 3/08 2.4 Warfarin 2.5 mg  3/09     2.6       Warfarin 2.5 mg    DDI: Warfarin/Ropinirole (ropinerole may increase INR while on warfarin), Warfarin/Celexa (SSRIs may increase bleed risk), Warfarin/Acetominophen (may increase bleed risk), Warfarin/Protonix (may increase INR)   Goal of Therapy:  INR 2-3   Plan:  INR is therapeutic and trending up. Has been  therapeutic for 3 consecutive days. Will give warfarin 2.5 mg tonight. Daily INR and CBC at least every 3 days per protocol.     Discharge Recommendation: Recommend discharging the patient on warfarin 2.5 mg daily (~25% increase in pt's home dose). Pt was discharged on 2 mg daily prior to admission and her INR was subtherapeutic. Check INR in 2-3 days.    Doreatha Massed, PharmD student  12/29/2020,9:17 AM

## 2020-12-30 DIAGNOSIS — R251 Tremor, unspecified: Secondary | ICD-10-CM | POA: Diagnosis not present

## 2020-12-30 DIAGNOSIS — I10 Essential (primary) hypertension: Secondary | ICD-10-CM | POA: Diagnosis not present

## 2020-12-30 DIAGNOSIS — N184 Chronic kidney disease, stage 4 (severe): Secondary | ICD-10-CM | POA: Diagnosis not present

## 2020-12-30 DIAGNOSIS — D631 Anemia in chronic kidney disease: Secondary | ICD-10-CM | POA: Diagnosis not present

## 2020-12-30 DIAGNOSIS — E538 Deficiency of other specified B group vitamins: Secondary | ICD-10-CM | POA: Diagnosis not present

## 2020-12-30 DIAGNOSIS — F339 Major depressive disorder, recurrent, unspecified: Secondary | ICD-10-CM | POA: Diagnosis not present

## 2020-12-30 DIAGNOSIS — F0390 Unspecified dementia without behavioral disturbance: Secondary | ICD-10-CM | POA: Diagnosis not present

## 2020-12-30 DIAGNOSIS — I4891 Unspecified atrial fibrillation: Secondary | ICD-10-CM | POA: Diagnosis not present

## 2020-12-30 DIAGNOSIS — G2581 Restless legs syndrome: Secondary | ICD-10-CM | POA: Diagnosis not present

## 2021-01-04 DIAGNOSIS — N184 Chronic kidney disease, stage 4 (severe): Secondary | ICD-10-CM | POA: Diagnosis not present

## 2021-01-04 DIAGNOSIS — I4891 Unspecified atrial fibrillation: Secondary | ICD-10-CM | POA: Diagnosis not present

## 2021-01-04 DIAGNOSIS — I5042 Chronic combined systolic (congestive) and diastolic (congestive) heart failure: Secondary | ICD-10-CM | POA: Diagnosis not present

## 2021-01-04 DIAGNOSIS — I10 Essential (primary) hypertension: Secondary | ICD-10-CM | POA: Diagnosis not present

## 2021-01-05 DIAGNOSIS — K5900 Constipation, unspecified: Secondary | ICD-10-CM | POA: Diagnosis not present

## 2021-01-10 DIAGNOSIS — I5042 Chronic combined systolic (congestive) and diastolic (congestive) heart failure: Secondary | ICD-10-CM | POA: Diagnosis not present

## 2021-01-10 DIAGNOSIS — R251 Tremor, unspecified: Secondary | ICD-10-CM | POA: Diagnosis not present

## 2021-01-10 DIAGNOSIS — N184 Chronic kidney disease, stage 4 (severe): Secondary | ICD-10-CM | POA: Diagnosis not present

## 2021-01-10 DIAGNOSIS — I4891 Unspecified atrial fibrillation: Secondary | ICD-10-CM | POA: Diagnosis not present

## 2021-01-10 DIAGNOSIS — N189 Chronic kidney disease, unspecified: Secondary | ICD-10-CM | POA: Diagnosis not present

## 2021-01-10 DIAGNOSIS — M6281 Muscle weakness (generalized): Secondary | ICD-10-CM | POA: Diagnosis not present

## 2021-01-10 DIAGNOSIS — I10 Essential (primary) hypertension: Secondary | ICD-10-CM | POA: Diagnosis not present

## 2021-01-10 DIAGNOSIS — D631 Anemia in chronic kidney disease: Secondary | ICD-10-CM | POA: Diagnosis not present

## 2021-01-10 DIAGNOSIS — E538 Deficiency of other specified B group vitamins: Secondary | ICD-10-CM | POA: Diagnosis not present

## 2021-01-10 DIAGNOSIS — E1122 Type 2 diabetes mellitus with diabetic chronic kidney disease: Secondary | ICD-10-CM | POA: Diagnosis not present

## 2021-01-11 ENCOUNTER — Telehealth: Payer: Self-pay

## 2021-01-11 NOTE — Telephone Encounter (Signed)
Amlodipine 10 mg once day  Lisinopril 20 mg once a day  Hydralazine 50 mg twice day, may take extra 50 mg once more if blood pressure is elevated

## 2021-01-12 ENCOUNTER — Ambulatory Visit (INDEPENDENT_AMBULATORY_CARE_PROVIDER_SITE_OTHER): Payer: Medicare HMO

## 2021-01-12 ENCOUNTER — Other Ambulatory Visit: Payer: Self-pay

## 2021-01-12 DIAGNOSIS — Z7901 Long term (current) use of anticoagulants: Secondary | ICD-10-CM

## 2021-01-12 NOTE — Progress Notes (Signed)
Pt  INR 1.3  As per  Dr Humphrey Rolls pt daughter  Take 1 extra coumadin today and go back normal dosing and repeat in 1 week

## 2021-01-13 ENCOUNTER — Other Ambulatory Visit: Payer: Self-pay

## 2021-01-13 MED ORDER — LISINOPRIL 20 MG PO TABS: 20.0000 mg | ORAL_TABLET | Freq: Every day | ORAL | 3 refills | Status: DC

## 2021-01-13 MED ORDER — AMLODIPINE BESYLATE 10 MG PO TABS: 10.0000 mg | ORAL_TABLET | Freq: Every day | ORAL | 3 refills | Status: DC

## 2021-01-13 MED ORDER — BLOOD PRESSURE MONITOR KIT: PACK | 1 refills | Status: DC

## 2021-01-13 MED ORDER — HYDRALAZINE HCL 50 MG PO TABS: ORAL_TABLET | ORAL | 3 refills | Status: DC

## 2021-01-13 NOTE — Telephone Encounter (Signed)
Spoke to pt's daughter and gave her new directions on medications.  Pt's daughter informed me that they didn't have a Bp machine to keep track of pt's Bp, daughter asked if we could send in a prescription for a Bp machine to pharmacy so I sent in a rx for the Bp machine.

## 2021-01-20 ENCOUNTER — Ambulatory Visit: Payer: Medicare HMO | Admitting: Physician Assistant

## 2021-01-20 DIAGNOSIS — I482 Chronic atrial fibrillation, unspecified: Secondary | ICD-10-CM | POA: Diagnosis not present

## 2021-01-20 DIAGNOSIS — E1122 Type 2 diabetes mellitus with diabetic chronic kidney disease: Secondary | ICD-10-CM | POA: Diagnosis not present

## 2021-01-20 DIAGNOSIS — N184 Chronic kidney disease, stage 4 (severe): Secondary | ICD-10-CM | POA: Diagnosis not present

## 2021-01-20 DIAGNOSIS — Z7901 Long term (current) use of anticoagulants: Secondary | ICD-10-CM | POA: Diagnosis not present

## 2021-01-20 DIAGNOSIS — I5042 Chronic combined systolic (congestive) and diastolic (congestive) heart failure: Secondary | ICD-10-CM | POA: Diagnosis not present

## 2021-01-20 DIAGNOSIS — D509 Iron deficiency anemia, unspecified: Secondary | ICD-10-CM | POA: Diagnosis not present

## 2021-01-20 DIAGNOSIS — D631 Anemia in chronic kidney disease: Secondary | ICD-10-CM | POA: Diagnosis not present

## 2021-01-20 DIAGNOSIS — I4891 Unspecified atrial fibrillation: Secondary | ICD-10-CM | POA: Diagnosis not present

## 2021-01-20 DIAGNOSIS — E11649 Type 2 diabetes mellitus with hypoglycemia without coma: Secondary | ICD-10-CM | POA: Diagnosis not present

## 2021-01-20 DIAGNOSIS — F339 Major depressive disorder, recurrent, unspecified: Secondary | ICD-10-CM | POA: Diagnosis not present

## 2021-01-20 DIAGNOSIS — F0390 Unspecified dementia without behavioral disturbance: Secondary | ICD-10-CM | POA: Diagnosis not present

## 2021-01-20 DIAGNOSIS — G2581 Restless legs syndrome: Secondary | ICD-10-CM | POA: Diagnosis not present

## 2021-01-20 DIAGNOSIS — I130 Hypertensive heart and chronic kidney disease with heart failure and stage 1 through stage 4 chronic kidney disease, or unspecified chronic kidney disease: Secondary | ICD-10-CM | POA: Diagnosis not present

## 2021-01-20 DIAGNOSIS — N1832 Chronic kidney disease, stage 3b: Secondary | ICD-10-CM | POA: Diagnosis not present

## 2021-01-20 LAB — PROTIME-INR

## 2021-01-21 ENCOUNTER — Other Ambulatory Visit: Payer: Self-pay

## 2021-01-21 ENCOUNTER — Ambulatory Visit (INDEPENDENT_AMBULATORY_CARE_PROVIDER_SITE_OTHER): Payer: Medicare HMO

## 2021-01-21 DIAGNOSIS — Z7901 Long term (current) use of anticoagulants: Secondary | ICD-10-CM | POA: Diagnosis not present

## 2021-01-21 LAB — BASIC METABOLIC PANEL
BUN/Creatinine Ratio: 8 — ABNORMAL LOW (ref 12–28)
BUN: 22 mg/dL (ref 8–27)
CO2: 18 mmol/L — ABNORMAL LOW (ref 20–29)
Calcium: 8.8 mg/dL (ref 8.7–10.3)
Chloride: 107 mmol/L — ABNORMAL HIGH (ref 96–106)
Creatinine, Ser: 2.63 mg/dL — ABNORMAL HIGH (ref 0.57–1.00)
Glucose: 128 mg/dL — ABNORMAL HIGH (ref 65–99)
Potassium: 5.3 mmol/L — ABNORMAL HIGH (ref 3.5–5.2)
Sodium: 139 mmol/L (ref 134–144)
eGFR: 18 mL/min/{1.73_m2} — ABNORMAL LOW (ref >59)

## 2021-01-21 LAB — IRON,TIBC AND FERRITIN PANEL
Ferritin: 47 ng/mL (ref 15–150)
Iron Saturation: 14 % — ABNORMAL LOW (ref 15–55)
Iron: 37 ug/dL (ref 27–139)
Total Iron Binding Capacity: 263 ug/dL (ref 250–450)
UIBC: 226 ug/dL (ref 118–369)

## 2021-01-21 LAB — CBC WITH DIFFERENTIAL/PLATELET
Basophils Absolute: 0.1 10*3/uL (ref 0.0–0.2)
Basos: 1 %
EOS (ABSOLUTE): 0.2 10*3/uL (ref 0.0–0.4)
Eos: 2 %
Hematocrit: 31.5 % — ABNORMAL LOW (ref 34.0–46.6)
Hemoglobin: 9.8 g/dL — ABNORMAL LOW (ref 11.1–15.9)
Immature Grans (Abs): 0.1 10*3/uL (ref 0.0–0.1)
Immature Granulocytes: 1 %
Lymphocytes Absolute: 2.3 10*3/uL (ref 0.7–3.1)
Lymphs: 31 %
MCH: 26.3 pg — ABNORMAL LOW (ref 26.6–33.0)
MCHC: 31.1 g/dL — ABNORMAL LOW (ref 31.5–35.7)
MCV: 85 fL (ref 79–97)
Monocytes Absolute: 0.5 10*3/uL (ref 0.1–0.9)
Monocytes: 6 %
Neutrophils Absolute: 4.4 10*3/uL (ref 1.4–7.0)
Neutrophils: 59 %
Platelets: 225 10*3/uL (ref 150–450)
RBC: 3.73 x10E6/uL — ABNORMAL LOW (ref 3.77–5.28)
RDW: 22.3 % — ABNORMAL HIGH (ref 11.7–15.4)
WBC: 7.5 10*3/uL (ref 3.4–10.8)

## 2021-01-21 NOTE — Progress Notes (Signed)
Pt advised  And also left several message to her daughter with detailed message take 1 tab extra dose for coumadin 2 mg and go back to normal as per lauren

## 2021-01-21 NOTE — Progress Notes (Signed)
GI App given

## 2021-01-21 NOTE — Addendum Note (Signed)
Addended by: Lavera Guise on: 01/21/2021 10:10 AM   Modules accepted: Orders

## 2021-01-24 ENCOUNTER — Encounter: Payer: Self-pay | Admitting: *Deleted

## 2021-01-24 ENCOUNTER — Encounter: Payer: Self-pay | Admitting: Physician Assistant

## 2021-01-24 ENCOUNTER — Other Ambulatory Visit: Payer: Self-pay

## 2021-01-24 ENCOUNTER — Ambulatory Visit (INDEPENDENT_AMBULATORY_CARE_PROVIDER_SITE_OTHER): Payer: Medicare HMO | Admitting: Physician Assistant

## 2021-01-24 DIAGNOSIS — I4891 Unspecified atrial fibrillation: Secondary | ICD-10-CM | POA: Diagnosis not present

## 2021-01-24 DIAGNOSIS — N184 Chronic kidney disease, stage 4 (severe): Secondary | ICD-10-CM | POA: Diagnosis not present

## 2021-01-24 DIAGNOSIS — Z7901 Long term (current) use of anticoagulants: Secondary | ICD-10-CM

## 2021-01-24 DIAGNOSIS — E1122 Type 2 diabetes mellitus with diabetic chronic kidney disease: Secondary | ICD-10-CM

## 2021-01-24 DIAGNOSIS — R110 Nausea: Secondary | ICD-10-CM

## 2021-01-24 DIAGNOSIS — E875 Hyperkalemia: Secondary | ICD-10-CM | POA: Diagnosis not present

## 2021-01-24 DIAGNOSIS — D509 Iron deficiency anemia, unspecified: Secondary | ICD-10-CM | POA: Diagnosis not present

## 2021-01-24 DIAGNOSIS — I10 Essential (primary) hypertension: Secondary | ICD-10-CM | POA: Diagnosis not present

## 2021-01-24 LAB — PROTIME-INR

## 2021-01-24 MED ORDER — PROMETHAZINE HCL 12.5 MG PO TABS: 12.5000 mg | ORAL_TABLET | Freq: Three times a day (TID) | ORAL | 0 refills | Status: DC | PRN

## 2021-01-24 NOTE — Progress Notes (Signed)
[facility] [address], [place] [address]  Internal MEDICINE  Office Visit Note  Patient Name: [name]  [id number]  [id number]  Date of Service: 01/26/2021  Chief Complaint  Patient presents with  . Hospitalization Follow-up    4 week fup, was in rehab also Home INR check Friday was 1.5    HPI Pt is here for f/u -Iron is improving since supplementing. GI referral placed last visit. Hemoglobin is stable. Renal function worse -Went to ED for weakness and hypertensive urgency on 12/23/20, HTN meds are now amlodipine 31m, lisinopril 235m and hydralazine 5059mID (extra dose if elevated). Left rehab on 01/10/21. -Not taking any lasix anymore -BP at home 120-140/47-57; Recheck in office 138/42 -BG 90-140 fasting on 6 units of toujeo -Elevated potassium and renal function declining since starting on lisinopril -She is still taking her coumadin and INR on Friday was 1.5  had an echo on 3/4 1. Left ventricular ejection fraction, by estimation, is 60 to 65%. The  left ventricle has normal function. The left ventricle has no regional  wall motion abnormalities. Left ventricular diastolic parameters are  indeterminate.  2. Right ventricular systolic function is normal. The right ventricular  size is normal. There is normal pulmonary artery systolic pressure.  3. Left atrial size was mildly dilated.  4. There is moderate calcification of the aortic valve. Aortic valve  regurgitation is mild. Mild aortic valve stenosis.  5. The inferior vena cava is dilated in size with <50% respiratory  variability, suggesting right atrial pressure of 15 mmHg.  6. Left pleural effusion noted.   Current Medication: Outpatient Encounter Medications as of 01/24/2021  Medication Sig  . promethazine (PHENERGAN) 12.5 MG tablet Take 1 tablet (12.5 mg total) by mouth every 8 (eight) hours as needed for nausea or vomiting.  . aMarland KitchenLODipine (NORVASC) 10 MG tablet Take 1 tablet (10 mg  total) by mouth daily.  . aMarland Kitchenorvastatin (LIPITOR) 10 MG tablet Take 1 tablet (10 mg total) by mouth every evening.  . bisacodyl (DULCOLAX) 5 MG EC tablet Take 1 tablet (5 mg total) by mouth daily as needed for moderate constipation.  . Blood Pressure Monitor KIT Use three times daily to check blood pressure  DX:  I50.42  . citalopram (CELEXA) 40 MG tablet Take 1 tablet (40 mg total) by mouth daily.  . digoxin (LANOXIN) 0.125 MG tablet Take 1 tablet (0.125 mg total) by mouth daily.  . dMarland Kitchennepezil (ARICEPT) 5 MG tablet TAKE 1 TABLET BY MOUTH ONCE DAILY FOR MEMORY  . ferrous sulfate 325 (65 FE) MG tablet Take 1 tablet (325 mg total) by mouth daily with breakfast.  . hydrALAZINE (APRESOLINE) 50 MG tablet Take 1 tablet by mouth twice a day, may take an extra 50 mg if Bp is elevated.  Hold if SBP less than 130 mmHg  . insulin glargine, 2 Unit Dial, (TOUJEO MAX SOLOSTAR) 300 UNIT/ML Solostar Pen Take 6 to 10 units with supper( needs needles as well e 11.65  . lisinopril (ZESTRIL) 20 MG tablet Take 1 tablet (20 mg total) by mouth daily.  . pantoprazole (PROTONIX) 40 MG tablet Take 1 tablet (40 mg total) by mouth 2 (two) times daily.  . polyethylene glycol (MIRALAX) 17 g packet Take 17 g by mouth daily.  . rMarland KitchenPINIRole (REQUIP) 0.5 MG tablet Take 1 tablet (0.5 mg total) by mouth every evening.  . vitamin B-12 1000 MCG tablet Take 1 tablet (1,000 mcg total) by mouth daily.  .Marland Kitchen  warfarin (COUMADIN) 2 MG tablet Take 1 tablet (2 mg total) by mouth daily.   No facility-administered encounter medications on file as of 01/24/2021.    Surgical History: Past Surgical History:  Procedure Laterality Date  . ABDOMINAL HYSTERECTOMY    . APPENDECTOMY    . CATARACT EXTRACTION    . CHOLECYSTECTOMY    . HERNIA REPAIR    . right knee replacement    . right nephroectomy      Medical History: Past Medical History:  Diagnosis Date  . Anemia   . Atrial fibrillation ([place])   . Congestive heart failure (CHF) ([place])   .  Diabetes mellitus without complication ([place])   . GERD (gastroesophageal reflux disease)   . Hyperlipidemia   . Hypertension     Family History: Family History  Problem Relation Age of Onset  . Breast cancer Mother     Social History   Socioeconomic History  . Marital status: Widowed    Spouse name: Not on file  . Number of children: Not on file  . Years of education: Not on file  . Highest education level: Not on file  Occupational History  . Not on file  Tobacco Use  . Smoking status: Current Every Day Smoker    Packs/day: 1.00    Types: Cigarettes    Last attempt to quit: 01/03/2019    Years since quitting: 2.0  . Smokeless tobacco: Never Used  Vaping Use  . Vaping Use: Never used  Substance and Sexual Activity  . Alcohol use: No  . Drug use: No  . Sexual activity: Not on file  Other Topics Concern  . Not on file  Social History Narrative  . Not on file   Social Determinants of Health   Financial Resource Strain: Not on file  Food Insecurity: Not on file  Transportation Needs: Not on file  Physical Activity: Not on file  Stress: Not on file  Social Connections: Not on file  Intimate Partner Violence: Not on file      Review of Systems  Constitutional: Positive for fatigue. Negative for chills and unexpected weight change.  HENT: Negative for congestion, rhinorrhea, sneezing and sore throat.   Eyes: Negative for redness.  Respiratory: Negative for cough, chest tightness and shortness of breath.   Cardiovascular: Negative for chest pain and palpitations.  Gastrointestinal: Negative for abdominal pain, blood in stool, constipation, diarrhea, nausea and vomiting.  Genitourinary: Negative for dysuria and frequency.  Musculoskeletal: Positive for gait problem. Negative for arthralgias, back pain, joint swelling and neck pain.       Using cane while she gains back strength  Skin: Negative for rash.  Neurological: Positive for weakness. Negative for tremors  and numbness.  Hematological: Negative for adenopathy. Does not bruise/bleed easily.  Psychiatric/Behavioral: Negative for behavioral problems (Depression), sleep disturbance and suicidal ideas. The patient is not nervous/anxious.     Vital Signs: BP (!) 144/44   Pulse 75   Temp 98.2 F (36.8 C)   Resp 16   Ht 4' 10" (1.473 m)   Wt 126 lb 12.8 oz (57.5 kg)   SpO2 98%   BMI 26.50 kg/m    Physical Exam Vitals and nursing note reviewed.  Constitutional:      General: She is not in acute distress.    Appearance: She is well-developed. She is not diaphoretic.  HENT:     Head: Normocephalic and atraumatic.     Mouth/Throat:     Pharynx: No oropharyngeal exudate.  Eyes:     Pupils: Pupils are equal, round, and reactive to light.  Neck:     Thyroid: No thyromegaly.     Vascular: No JVD.     Trachea: No tracheal deviation.  Cardiovascular:     Rate and Rhythm: Normal rate and regular rhythm.     Heart sounds: Normal heart sounds. No murmur heard. No friction rub. No gallop.   Pulmonary:     Effort: Pulmonary effort is normal. No respiratory distress.     Breath sounds: No wheezing or rales.  Chest:     Chest wall: No tenderness.  Abdominal:     General: Bowel sounds are normal.     Palpations: Abdomen is soft.  Musculoskeletal:        General: Normal range of motion.     Cervical back: Normal range of motion and neck supple.     Right lower leg: No edema.     Left lower leg: No edema.  Lymphadenopathy:     Cervical: No cervical adenopathy.  Skin:    General: Skin is warm and dry.  Neurological:     Mental Status: She is alert and oriented to person, place, and time.     Cranial Nerves: No cranial nerve deficit.     Motor: Weakness present.     Gait: Gait abnormal.     Comments: Walking with cane for stability while gaining stregnth  Psychiatric:        Behavior: Behavior normal.        Thought Content: Thought content normal.        Judgment: Judgment normal.         Assessment/Plan: 1. Essential hypertension BP mildly elevated, improved on recheck. Due to worsening renal function and high potassium will stop lisinopril and increase amlodipine to 1.5 tabs (94m total). Will continue hydralazine 560mBID with extra dose if BP still elevated. Continue to monitor Bp closely at home.  2. Iron deficiency anemia, unspecified iron deficiency anemia type GI referral placed last visit--Pt's daughter given number to call office directly for appt since they have been unable to reach pt to schedule. Continue iron supplement.  3. CKD stage 4 due to type 2 diabetes mellitus (HCBeltramiS/p unilateral nephrectomy. Worsening renal function, will change BP meds, but may need nephrology referral in future. Continue 6 units toujeo for BG control.  5. Atrial fibrillation, unspecified type (HCBiggersContinue lanoxin  6. Long term (current) use of anticoagulants Continue coumadin  7. Hyperkalemia Will stop lisinopril and increase amlodipine to help reduce potassium  8. Nausea - promethazine (PHENERGAN) 12.5 MG tablet; Take 1 tablet (12.5 mg total) by mouth every 8 (eight) hours as needed for nausea or vomiting.  Dispense: 20 tablet; Refill: 0   General Counseling: Lanitra verbalizes understanding of the findings of todays visit and agrees with plan of treatment. I have discussed any further diagnostic evaluation that may be needed or ordered today. We also reviewed her medications today. she has been encouraged to call the office with any questions or concerns that should arise related to todays visit.    No orders of the defined types were placed in this encounter.   Meds ordered this encounter  Medications  . promethazine (PHENERGAN) 12.5 MG tablet    Sig: Take 1 tablet (12.5 mg total) by mouth every 8 (eight) hours as needed for nausea or vomiting.    Dispense:  20 tablet    Refill:  0    This patient  was seen by Drema Dallas, PA-C in collaboration with  Dr. Clayborn Bigness as a part of collaborative care agreement.   Total time spent:30 Minutes Time spent includes review of chart, medications, test results, and follow up plan with the patient.      Dr Lavera Guise Internal medicine

## 2021-01-26 DIAGNOSIS — F0390 Unspecified dementia without behavioral disturbance: Secondary | ICD-10-CM | POA: Diagnosis not present

## 2021-01-26 DIAGNOSIS — I130 Hypertensive heart and chronic kidney disease with heart failure and stage 1 through stage 4 chronic kidney disease, or unspecified chronic kidney disease: Secondary | ICD-10-CM | POA: Diagnosis not present

## 2021-01-26 DIAGNOSIS — I5042 Chronic combined systolic (congestive) and diastolic (congestive) heart failure: Secondary | ICD-10-CM | POA: Diagnosis not present

## 2021-01-26 DIAGNOSIS — I482 Chronic atrial fibrillation, unspecified: Secondary | ICD-10-CM | POA: Diagnosis not present

## 2021-01-26 DIAGNOSIS — D631 Anemia in chronic kidney disease: Secondary | ICD-10-CM | POA: Diagnosis not present

## 2021-01-26 DIAGNOSIS — E1122 Type 2 diabetes mellitus with diabetic chronic kidney disease: Secondary | ICD-10-CM | POA: Diagnosis not present

## 2021-01-26 DIAGNOSIS — F339 Major depressive disorder, recurrent, unspecified: Secondary | ICD-10-CM | POA: Diagnosis not present

## 2021-01-26 DIAGNOSIS — N184 Chronic kidney disease, stage 4 (severe): Secondary | ICD-10-CM | POA: Diagnosis not present

## 2021-01-26 DIAGNOSIS — G2581 Restless legs syndrome: Secondary | ICD-10-CM | POA: Diagnosis not present

## 2021-02-01 ENCOUNTER — Ambulatory Visit (INDEPENDENT_AMBULATORY_CARE_PROVIDER_SITE_OTHER): Payer: Medicare HMO

## 2021-02-01 DIAGNOSIS — Z7901 Long term (current) use of anticoagulants: Secondary | ICD-10-CM

## 2021-02-01 NOTE — Progress Notes (Signed)
PT INR 1.5 as per dr Humphrey Rolls no change for now

## 2021-02-02 LAB — PROTIME-INR

## 2021-02-03 ENCOUNTER — Other Ambulatory Visit: Payer: Self-pay

## 2021-02-03 ENCOUNTER — Telehealth: Payer: Self-pay

## 2021-02-03 MED ORDER — FLUTICASONE PROPIONATE 50 MCG/ACT NA SUSP: 1.0000 | Freq: Every day | NASAL | 0 refills | Status: DC

## 2021-02-03 NOTE — Telephone Encounter (Signed)
Pt daughter called pt is having post nasal drips no fever at coughing at night as per lauren advised that we send Flonase and also she can take Mucinex at day time and Delsym at night time for cough

## 2021-02-06 ENCOUNTER — Emergency Department: Payer: Medicare HMO

## 2021-02-06 ENCOUNTER — Other Ambulatory Visit: Payer: Self-pay

## 2021-02-06 ENCOUNTER — Inpatient Hospital Stay: Payer: Medicare HMO

## 2021-02-06 ENCOUNTER — Inpatient Hospital Stay
Admission: EM | Admit: 2021-02-06 | Discharge: 2021-02-10 | DRG: 291 | Disposition: A | Discharge status: Home Health | Payer: Medicare HMO | Attending: Internal Medicine | Admitting: Internal Medicine

## 2021-02-06 DIAGNOSIS — Z794 Long term (current) use of insulin: Secondary | ICD-10-CM

## 2021-02-06 DIAGNOSIS — B9789 Other viral agents as the cause of diseases classified elsewhere: Secondary | ICD-10-CM | POA: Diagnosis present

## 2021-02-06 DIAGNOSIS — N185 Chronic kidney disease, stage 5: Secondary | ICD-10-CM | POA: Diagnosis not present

## 2021-02-06 DIAGNOSIS — F17210 Nicotine dependence, cigarettes, uncomplicated: Secondary | ICD-10-CM | POA: Diagnosis present

## 2021-02-06 DIAGNOSIS — R0602 Shortness of breath: Secondary | ICD-10-CM | POA: Diagnosis not present

## 2021-02-06 DIAGNOSIS — E871 Hypo-osmolality and hyponatremia: Secondary | ICD-10-CM | POA: Diagnosis not present

## 2021-02-06 DIAGNOSIS — E785 Hyperlipidemia, unspecified: Secondary | ICD-10-CM | POA: Diagnosis present

## 2021-02-06 DIAGNOSIS — N184 Chronic kidney disease, stage 4 (severe): Secondary | ICD-10-CM

## 2021-02-06 DIAGNOSIS — D631 Anemia in chronic kidney disease: Secondary | ICD-10-CM | POA: Diagnosis present

## 2021-02-06 DIAGNOSIS — Z9049 Acquired absence of other specified parts of digestive tract: Secondary | ICD-10-CM

## 2021-02-06 DIAGNOSIS — E1165 Type 2 diabetes mellitus with hyperglycemia: Secondary | ICD-10-CM | POA: Diagnosis present

## 2021-02-06 DIAGNOSIS — J441 Chronic obstructive pulmonary disease with (acute) exacerbation: Secondary | ICD-10-CM

## 2021-02-06 DIAGNOSIS — N179 Acute kidney failure, unspecified: Secondary | ICD-10-CM

## 2021-02-06 DIAGNOSIS — N189 Chronic kidney disease, unspecified: Secondary | ICD-10-CM

## 2021-02-06 DIAGNOSIS — K219 Gastro-esophageal reflux disease without esophagitis: Secondary | ICD-10-CM | POA: Diagnosis present

## 2021-02-06 DIAGNOSIS — I132 Hypertensive heart and chronic kidney disease with heart failure and with stage 5 chronic kidney disease, or end stage renal disease: Principal | ICD-10-CM | POA: Diagnosis present

## 2021-02-06 DIAGNOSIS — Z905 Acquired absence of kidney: Secondary | ICD-10-CM

## 2021-02-06 DIAGNOSIS — Z20822 Contact with and (suspected) exposure to covid-19: Secondary | ICD-10-CM | POA: Diagnosis present

## 2021-02-06 DIAGNOSIS — J440 Chronic obstructive pulmonary disease with acute lower respiratory infection: Secondary | ICD-10-CM | POA: Diagnosis present

## 2021-02-06 DIAGNOSIS — Z79899 Other long term (current) drug therapy: Secondary | ICD-10-CM

## 2021-02-06 DIAGNOSIS — E872 Acidosis: Secondary | ICD-10-CM | POA: Diagnosis not present

## 2021-02-06 DIAGNOSIS — Z96651 Presence of right artificial knee joint: Secondary | ICD-10-CM | POA: Diagnosis present

## 2021-02-06 DIAGNOSIS — M479 Spondylosis, unspecified: Secondary | ICD-10-CM | POA: Diagnosis present

## 2021-02-06 DIAGNOSIS — J90 Pleural effusion, not elsewhere classified: Secondary | ICD-10-CM | POA: Diagnosis not present

## 2021-02-06 DIAGNOSIS — D509 Iron deficiency anemia, unspecified: Secondary | ICD-10-CM | POA: Diagnosis present

## 2021-02-06 DIAGNOSIS — J129 Viral pneumonia, unspecified: Secondary | ICD-10-CM | POA: Diagnosis not present

## 2021-02-06 DIAGNOSIS — I5033 Acute on chronic diastolic (congestive) heart failure: Secondary | ICD-10-CM | POA: Diagnosis present

## 2021-02-06 DIAGNOSIS — Z7901 Long term (current) use of anticoagulants: Secondary | ICD-10-CM

## 2021-02-06 DIAGNOSIS — I4821 Permanent atrial fibrillation: Secondary | ICD-10-CM | POA: Diagnosis not present

## 2021-02-06 DIAGNOSIS — Z90710 Acquired absence of both cervix and uterus: Secondary | ICD-10-CM

## 2021-02-06 DIAGNOSIS — I129 Hypertensive chronic kidney disease with stage 1 through stage 4 chronic kidney disease, or unspecified chronic kidney disease: Secondary | ICD-10-CM | POA: Diagnosis not present

## 2021-02-06 DIAGNOSIS — R809 Proteinuria, unspecified: Secondary | ICD-10-CM | POA: Diagnosis present

## 2021-02-06 DIAGNOSIS — E1122 Type 2 diabetes mellitus with diabetic chronic kidney disease: Secondary | ICD-10-CM | POA: Diagnosis present

## 2021-02-06 DIAGNOSIS — I110 Hypertensive heart disease with heart failure: Secondary | ICD-10-CM | POA: Diagnosis not present

## 2021-02-06 DIAGNOSIS — F17200 Nicotine dependence, unspecified, uncomplicated: Secondary | ICD-10-CM | POA: Diagnosis present

## 2021-02-06 DIAGNOSIS — R059 Cough, unspecified: Secondary | ICD-10-CM | POA: Diagnosis not present

## 2021-02-06 DIAGNOSIS — R0902 Hypoxemia: Secondary | ICD-10-CM | POA: Diagnosis present

## 2021-02-06 DIAGNOSIS — Z862 Personal history of diseases of the blood and blood-forming organs and certain disorders involving the immune mechanism: Secondary | ICD-10-CM

## 2021-02-06 DIAGNOSIS — I959 Hypotension, unspecified: Secondary | ICD-10-CM | POA: Diagnosis not present

## 2021-02-06 DIAGNOSIS — E875 Hyperkalemia: Secondary | ICD-10-CM

## 2021-02-06 DIAGNOSIS — I700 Atherosclerosis of aorta: Secondary | ICD-10-CM | POA: Diagnosis present

## 2021-02-06 DIAGNOSIS — I4891 Unspecified atrial fibrillation: Secondary | ICD-10-CM | POA: Diagnosis not present

## 2021-02-06 DIAGNOSIS — R062 Wheezing: Secondary | ICD-10-CM | POA: Diagnosis not present

## 2021-02-06 DIAGNOSIS — T464X5A Adverse effect of angiotensin-converting-enzyme inhibitors, initial encounter: Secondary | ICD-10-CM | POA: Diagnosis present

## 2021-02-06 DIAGNOSIS — I482 Chronic atrial fibrillation, unspecified: Secondary | ICD-10-CM | POA: Diagnosis present

## 2021-02-06 DIAGNOSIS — N178 Other acute kidney failure: Secondary | ICD-10-CM | POA: Diagnosis not present

## 2021-02-06 DIAGNOSIS — I509 Heart failure, unspecified: Secondary | ICD-10-CM | POA: Diagnosis not present

## 2021-02-06 LAB — CBC WITH DIFFERENTIAL/PLATELET
Abs Immature Granulocytes: 0.08 10*3/uL — ABNORMAL HIGH (ref 0.00–0.07)
Basophils Absolute: 0.1 10*3/uL (ref 0.0–0.1)
Basophils Relative: 1 %
Eosinophils Absolute: 0.1 10*3/uL (ref 0.0–0.5)
Eosinophils Relative: 1 %
HCT: 27 % — ABNORMAL LOW (ref 36.0–46.0)
Hemoglobin: 9 g/dL — ABNORMAL LOW (ref 12.0–15.0)
Immature Granulocytes: 1 %
Lymphocytes Relative: 20 %
Lymphs Abs: 1.8 10*3/uL (ref 0.7–4.0)
MCH: 28 pg (ref 26.0–34.0)
MCHC: 33.3 g/dL (ref 30.0–36.0)
MCV: 83.9 fL (ref 80.0–100.0)
Monocytes Absolute: 0.7 10*3/uL (ref 0.1–1.0)
Monocytes Relative: 8 %
Neutro Abs: 6 10*3/uL (ref 1.7–7.7)
Neutrophils Relative %: 69 %
Platelets: 237 10*3/uL (ref 150–400)
RBC: 3.22 MIL/uL — ABNORMAL LOW (ref 3.87–5.11)
RDW: 22.8 % — ABNORMAL HIGH (ref 11.5–15.5)
WBC: 8.6 10*3/uL (ref 4.0–10.5)
nRBC: 0 % (ref 0.0–0.2)

## 2021-02-06 LAB — RESP PANEL BY RT-PCR (FLU A&B, COVID) ARPGX2
Influenza A by PCR: NEGATIVE
Influenza B by PCR: NEGATIVE
SARS Coronavirus 2 by RT PCR: NEGATIVE

## 2021-02-06 LAB — IRON AND TIBC
Iron: 19 ug/dL — ABNORMAL LOW (ref 28–170)
Saturation Ratios: 7 % — ABNORMAL LOW (ref 10.4–31.8)
TIBC: 280 ug/dL (ref 250–450)
UIBC: 261 ug/dL

## 2021-02-06 LAB — BASIC METABOLIC PANEL
Anion gap: 8 (ref 5–15)
BUN: 30 mg/dL — ABNORMAL HIGH (ref 8–23)
CO2: 18 mmol/L — ABNORMAL LOW (ref 22–32)
Calcium: 8.7 mg/dL — ABNORMAL LOW (ref 8.9–10.3)
Chloride: 110 mmol/L (ref 98–111)
Creatinine, Ser: 2.74 mg/dL — ABNORMAL HIGH (ref 0.44–1.00)
GFR, Estimated: 17 mL/min — ABNORMAL LOW (ref >60)
Glucose, Bld: 133 mg/dL — ABNORMAL HIGH (ref 70–99)
Potassium: 5.4 mmol/L — ABNORMAL HIGH (ref 3.5–5.1)
Sodium: 136 mmol/L (ref 135–145)

## 2021-02-06 LAB — RESPIRATORY PANEL BY PCR

## 2021-02-06 LAB — CBG MONITORING, ED
Glucose-Capillary: 274 mg/dL — ABNORMAL HIGH (ref 70–99)
Glucose-Capillary: 158 mg/dL — ABNORMAL HIGH (ref 70–99)

## 2021-02-06 LAB — FERRITIN: Ferritin: 32 ng/mL (ref 11–307)

## 2021-02-06 LAB — BRAIN NATRIURETIC PEPTIDE: B Natriuretic Peptide: 233.7 pg/mL — ABNORMAL HIGH (ref 0.0–100.0)

## 2021-02-06 LAB — PROTIME-INR
INR: 1.6 — ABNORMAL HIGH (ref 0.8–1.2)
Prothrombin Time: 18.7 s — ABNORMAL HIGH (ref 11.4–15.2)

## 2021-02-06 LAB — DIGOXIN LEVEL: Digoxin Level: 2.6 ng/mL (ref 0.8–2.0)

## 2021-02-06 LAB — HEPATITIS C ANTIBODY: HCV Ab: NONREACTIVE

## 2021-02-06 LAB — HEPATITIS B CORE ANTIBODY, IGM: Hep B C IgM: NONREACTIVE

## 2021-02-06 LAB — HEPATITIS B SURFACE ANTIBODY,QUALITATIVE: Hep B S Ab: NONREACTIVE

## 2021-02-06 LAB — PHOSPHORUS: Phosphorus: 5.2 mg/dL — ABNORMAL HIGH (ref 2.5–4.6)

## 2021-02-06 LAB — PROTEIN / CREATININE RATIO, URINE
Creatinine, Urine: 117 mg/dL
Protein Creatinine Ratio: 1.42 mg/mg{Cre} — ABNORMAL HIGH (ref 0.00–0.15)
Total Protein, Urine: 166 mg/dL

## 2021-02-06 LAB — VITAMIN B12: Vitamin B-12: 744 pg/mL (ref 180–914)

## 2021-02-06 LAB — HEPATITIS B SURFACE ANTIGEN: Hepatitis B Surface Ag: NONREACTIVE

## 2021-02-06 LAB — FOLATE: Folate: 16.8 ng/mL (ref >5.9)

## 2021-02-06 LAB — TROPONIN I (HIGH SENSITIVITY)
Troponin I (High Sensitivity): 12 ng/L (ref <18)
Troponin I (High Sensitivity): 11 ng/L (ref <18)

## 2021-02-06 MED ORDER — WARFARIN SODIUM 2 MG PO TABS
2.0000 mg | ORAL_TABLET | Freq: Once | ORAL | Status: AC
  Administered 2021-02-07: 2 mg via ORAL
  Filled 2021-02-06: qty 1

## 2021-02-06 MED ORDER — DIGOXIN 125 MCG PO TABS: 0.1250 mg | ORAL_TABLET | Freq: Every day | ORAL | Status: DC

## 2021-02-06 MED ORDER — ACETAMINOPHEN 325 MG PO TABS: 650.0000 mg | ORAL_TABLET | ORAL | Status: DC | PRN

## 2021-02-06 MED ORDER — VITAMIN B-12 1000 MCG PO TABS
1000.0000 ug | ORAL_TABLET | Freq: Every day | ORAL | Status: DC
  Administered 2021-02-07 – 2021-02-10 (×4): 1000 ug via ORAL
  Filled 2021-02-06 (×6): qty 1

## 2021-02-06 MED ORDER — FLUTICASONE PROPIONATE 50 MCG/ACT NA SUSP: 1.0000 | Freq: Every day | NASAL | Status: DC

## 2021-02-06 MED ORDER — DONEPEZIL HCL 5 MG PO TABS
10.0000 mg | ORAL_TABLET | Freq: Every day | ORAL | Status: DC
  Administered 2021-02-07 – 2021-02-09 (×4): 10 mg via ORAL
  Filled 2021-02-06 (×6): qty 2

## 2021-02-06 MED ORDER — SODIUM CHLORIDE 0.9% FLUSH: 3.0000 mL | INTRAVENOUS | Status: DC | PRN

## 2021-02-06 MED ORDER — WARFARIN - PHYSICIAN DOSING INPATIENT
Freq: Every day | Status: DC
  Filled 2021-02-06: qty 1

## 2021-02-06 MED ORDER — FUROSEMIDE 10 MG/ML IJ SOLN
60.0000 mg | Freq: Two times a day (BID) | INTRAMUSCULAR | Status: DC
  Administered 2021-02-06 – 2021-02-07 (×2): 60 mg via INTRAVENOUS
  Filled 2021-02-06 (×2): qty 8

## 2021-02-06 MED ORDER — BISACODYL 5 MG PO TBEC: 5.0000 mg | DELAYED_RELEASE_TABLET | Freq: Every day | ORAL | Status: DC | PRN

## 2021-02-06 MED ORDER — CITALOPRAM HYDROBROMIDE 20 MG PO TABS
40.0000 mg | ORAL_TABLET | Freq: Every day | ORAL | Status: DC
  Administered 2021-02-07 – 2021-02-10 (×4): 40 mg via ORAL
  Filled 2021-02-06 (×4): qty 2

## 2021-02-06 MED ORDER — INSULIN ASPART 100 UNIT/ML ~~LOC~~ SOLN
0.0000 [IU] | Freq: Three times a day (TID) | SUBCUTANEOUS | Status: DC
  Administered 2021-02-06: 2 [IU] via SUBCUTANEOUS
  Administered 2021-02-06: 5 [IU] via SUBCUTANEOUS
  Administered 2021-02-07 – 2021-02-08 (×3): 1 [IU] via SUBCUTANEOUS
  Administered 2021-02-08: 2 [IU] via SUBCUTANEOUS
  Administered 2021-02-09 (×2): 1 [IU] via SUBCUTANEOUS
  Administered 2021-02-09: 5 [IU] via SUBCUTANEOUS
  Administered 2021-02-10: 1 [IU] via SUBCUTANEOUS
  Administered 2021-02-10: 5 [IU] via SUBCUTANEOUS
  Administered 2021-02-10: 3 [IU] via SUBCUTANEOUS
  Filled 2021-02-06 (×12): qty 1

## 2021-02-06 MED ORDER — IPRATROPIUM BROMIDE 0.02 % IN SOLN
0.5000 mg | Freq: Once | RESPIRATORY_TRACT | Status: AC
  Administered 2021-02-06: 0.5 mg via RESPIRATORY_TRACT
  Filled 2021-02-06: qty 2.5

## 2021-02-06 MED ORDER — POLYETHYLENE GLYCOL 3350 17 G PO PACK
17.0000 g | PACK | Freq: Every day | ORAL | Status: DC
  Administered 2021-02-07 – 2021-02-10 (×4): 17 g via ORAL
  Filled 2021-02-06 (×4): qty 1

## 2021-02-06 MED ORDER — FERROUS SULFATE 325 (65 FE) MG PO TABS
325.0000 mg | ORAL_TABLET | Freq: Every day | ORAL | Status: DC
  Administered 2021-02-07 – 2021-02-10 (×4): 325 mg via ORAL
  Filled 2021-02-06 (×4): qty 1

## 2021-02-06 MED ORDER — LISINOPRIL 10 MG PO TABS
20.0000 mg | ORAL_TABLET | Freq: Every day | ORAL | Status: DC
  Administered 2021-02-07: 20 mg via ORAL
  Filled 2021-02-06: qty 2

## 2021-02-06 MED ORDER — FLUTICASONE PROPIONATE 50 MCG/ACT NA SUSP
1.0000 | Freq: Every day | NASAL | Status: DC
  Administered 2021-02-07 – 2021-02-10 (×4): 1 via NASAL
  Filled 2021-02-06 (×2): qty 16

## 2021-02-06 MED ORDER — PANTOPRAZOLE SODIUM 40 MG PO TBEC
40.0000 mg | DELAYED_RELEASE_TABLET | Freq: Two times a day (BID) | ORAL | Status: DC
  Administered 2021-02-07 – 2021-02-10 (×7): 40 mg via ORAL
  Filled 2021-02-06 (×8): qty 1

## 2021-02-06 MED ORDER — ENOXAPARIN SODIUM 30 MG/0.3ML ~~LOC~~ SOLN: 30.0000 mg | SUBCUTANEOUS | Status: DC

## 2021-02-06 MED ORDER — SODIUM CHLORIDE 0.9 % IV SOLN: 250.0000 mL | INTRAVENOUS | Status: DC | PRN

## 2021-02-06 MED ORDER — NICOTINE 14 MG/24HR TD PT24
14.0000 mg | MEDICATED_PATCH | Freq: Every day | TRANSDERMAL | Status: DC
  Administered 2021-02-07 – 2021-02-10 (×4): 14 mg via TRANSDERMAL
  Filled 2021-02-06 (×4): qty 1

## 2021-02-06 MED ORDER — ALBUTEROL SULFATE (2.5 MG/3ML) 0.083% IN NEBU
5.0000 mg | INHALATION_SOLUTION | Freq: Once | RESPIRATORY_TRACT | Status: AC
  Administered 2021-02-06: 5 mg via RESPIRATORY_TRACT
  Filled 2021-02-06: qty 6

## 2021-02-06 MED ORDER — FUROSEMIDE 10 MG/ML IJ SOLN
40.0000 mg | Freq: Once | INTRAMUSCULAR | Status: AC
  Administered 2021-02-06: 40 mg via INTRAVENOUS
  Filled 2021-02-06: qty 4

## 2021-02-06 MED ORDER — ATORVASTATIN CALCIUM 10 MG PO TABS
10.0000 mg | ORAL_TABLET | Freq: Every evening | ORAL | Status: DC
  Administered 2021-02-06 – 2021-02-10 (×5): 10 mg via ORAL
  Filled 2021-02-06 (×5): qty 1

## 2021-02-06 MED ORDER — METHYLPREDNISOLONE SODIUM SUCC 125 MG IJ SOLR
125.0000 mg | Freq: Once | INTRAMUSCULAR | Status: AC
  Administered 2021-02-06: 125 mg via INTRAVENOUS
  Filled 2021-02-06: qty 2

## 2021-02-06 MED ORDER — ENOXAPARIN SODIUM 30 MG/0.3ML ~~LOC~~ SOLN
30.0000 mg | SUBCUTANEOUS | Status: DC
  Administered 2021-02-07 – 2021-02-09 (×4): 30 mg via SUBCUTANEOUS
  Filled 2021-02-06 (×5): qty 0.3

## 2021-02-06 MED ORDER — SODIUM CHLORIDE 0.9% FLUSH
3.0000 mL | Freq: Two times a day (BID) | INTRAVENOUS | Status: DC
  Administered 2021-02-07 – 2021-02-10 (×6): 3 mL via INTRAVENOUS

## 2021-02-06 MED ORDER — SODIUM ZIRCONIUM CYCLOSILICATE 10 G PO PACK
10.0000 g | PACK | Freq: Once | ORAL | Status: AC
  Administered 2021-02-06: 10 g via ORAL
  Filled 2021-02-06: qty 1

## 2021-02-06 MED ORDER — IPRATROPIUM-ALBUTEROL 0.5-2.5 (3) MG/3ML IN SOLN
3.0000 mL | Freq: Four times a day (QID) | RESPIRATORY_TRACT | Status: DC | PRN
  Administered 2021-02-06 – 2021-02-10 (×4): 3 mL via RESPIRATORY_TRACT
  Filled 2021-02-06 (×4): qty 3

## 2021-02-06 MED ORDER — ONDANSETRON HCL 4 MG/2ML IJ SOLN: 4.0000 mg | Freq: Four times a day (QID) | INTRAMUSCULAR | Status: DC | PRN

## 2021-02-06 MED ORDER — WARFARIN SODIUM 2 MG PO TABS
2.0000 mg | ORAL_TABLET | Freq: Every day | ORAL | Status: DC
  Filled 2021-02-06: qty 1

## 2021-02-06 MED ORDER — ROPINIROLE HCL 1 MG PO TABS
0.5000 mg | ORAL_TABLET | Freq: Every evening | ORAL | Status: DC
  Administered 2021-02-06 – 2021-02-10 (×5): 0.5 mg via ORAL
  Filled 2021-02-06 (×5): qty 1

## 2021-02-06 NOTE — ED Provider Notes (Signed)
[facility] Emergency Department Provider Note  ____________________________________________   Event Date/Time   First MD Initiated Contact with Patient 02/06/21 [phone]     (approximate)  I have reviewed the triage vital signs and the nursing notes.   HISTORY  Chief Complaint Shortness of Breath    HPI [name] is a 80 y.o. female with history of CHF, atrial fibrillation, hypertension, hyperlipidemia, COPD with tobacco use not on oxygen who presents to the emergency department EMS with complaints of shortness of breath for the past several days.  She has been wheezing.  Has had cough with clear sputum production.  Reports nasal congestion.  No fever.  No calf tenderness or calf swelling.  No history of PE or DVT.  She denies any chest pain.  Room air sats with EMS between 90 and 91%.  No medications given in route.        Past Medical History:  Diagnosis Date  . Anemia   . Atrial fibrillation ([place])   . Congestive heart failure (CHF) ([place])   . Diabetes mellitus without complication ([place])   . GERD (gastroesophageal reflux disease)   . Hyperlipidemia   . Hypertension     Patient Active Problem List   Diagnosis Date Noted  . Weakness 12/24/2020  . Hypertensive urgency 12/23/2020  . Nicotine dependence 12/23/2020  . History of anemia due to chronic kidney disease 12/23/2020  . Pain due to onychomycosis of toenails of both feet 02/12/2020  . Uncontrolled type 2 diabetes mellitus with hyperglycemia ([place]) 05/20/2019  . Acute recurrent pansinusitis 05/11/2019  . Acute otitis media 05/11/2019  . Acute pain of left shoulder 05/11/2019  . Long term (current) use of anticoagulants 04/03/2019  . Chronic left shoulder pain 03/18/2019  . Sepsis ([place]) 01/12/2018  . Colitis 01/12/2018  . CKD (chronic kidney disease), stage III ([place]) 01/12/2018  . Diabetes ([place]) 12/28/2017  . AF (paroxysmal atrial fibrillation) ([place]) 12/28/2017  . Encounter for general  adult medical examination with abnormal findings 12/28/2017  . Primary generalized (osteo)arthritis 12/28/2017  . Hypertension 12/27/2017  . GERD (gastroesophageal reflux disease) 12/27/2017  . Hematuria 03/18/2015  . Chronic combined systolic and diastolic heart failure ([place]) 03/01/2012  . Obesity 03/01/2012  . CKD stage 4 due to type 2 diabetes mellitus ([place]) 03/01/2012  . Other benign neoplasm of connective and other soft tissue of lower limb, including hip 03/15/2011  . Neoplasm of connective tissue 02/01/2011  . Pain in joint, pelvic region and thigh 02/01/2011  . Type II diabetes mellitus ([place]) 10/03/2006  . Encounter for current long-term use of anticoagulants 02/07/2006  . Essential hypertension 09/24/2004  . Atrial fibrillation ([place]) 06/07/2004    Past Surgical History:  Procedure Laterality Date  . ABDOMINAL HYSTERECTOMY    . APPENDECTOMY    . CATARACT EXTRACTION    . CHOLECYSTECTOMY    . HERNIA REPAIR    . right knee replacement    . right nephroectomy      Prior to Admission medications   Medication Sig Start Date End Date Taking? Authorizing Provider  amLODipine (NORVASC) 10 MG tablet Take 1 tablet (10 mg total) by mouth daily. 01/13/21   Lavera Guise, MD  atorvastatin (LIPITOR) 10 MG tablet Take 1 tablet (10 mg total) by mouth every evening. 12/06/20   McDonough, Si Gaul, PA-C  bisacodyl (DULCOLAX) 5 MG EC tablet Take 1 tablet (5 mg total) by mouth daily as needed for moderate constipation. 12/29/20   Val Riles, MD  Blood Pressure Monitor KIT Use three times daily to check blood pressure  DX:  I50.42 01/13/21   Lavera Guise, MD  citalopram (CELEXA) 40 MG tablet Take 1 tablet (40 mg total) by mouth daily. 10/20/20   Lavera Guise, MD  digoxin (LANOXIN) 0.125 MG tablet Take 1 tablet (0.125 mg total) by mouth daily. 12/06/20   McDonough, Lauren K, PA-C  donepezil (ARICEPT) 5 MG tablet TAKE 1 TABLET BY MOUTH ONCE DAILY FOR MEMORY 12/06/20   McDonough, Si Gaul, PA-C   ferrous sulfate 325 (65 FE) MG tablet Take 1 tablet (325 mg total) by mouth daily with breakfast. 12/30/20   Val Riles, MD  fluticasone (FLONASE) 50 MCG/ACT nasal spray Place 1 spray into both nostrils daily. 02/03/21   McDonough, Si Gaul, PA-C  hydrALAZINE (APRESOLINE) 50 MG tablet Take 1 tablet by mouth twice a day, may take an extra 50 mg if Bp is elevated.  Hold if SBP less than 130 mmHg 01/13/21   Lavera Guise, MD  insulin glargine, 2 Unit Dial, (TOUJEO MAX SOLOSTAR) 300 UNIT/ML Solostar Pen Take 6 to 10 units with supper( needs needles as well e 11.65 12/20/20   Lavera Guise, MD  lisinopril (ZESTRIL) 20 MG tablet Take 1 tablet (20 mg total) by mouth daily. 01/13/21   Lavera Guise, MD  pantoprazole (PROTONIX) 40 MG tablet Take 1 tablet (40 mg total) by mouth 2 (two) times daily. 10/20/20   Lavera Guise, MD  polyethylene glycol (MIRALAX) 17 g packet Take 17 g by mouth daily. 12/29/20   Val Riles, MD  promethazine (PHENERGAN) 12.5 MG tablet Take 1 tablet (12.5 mg total) by mouth every 8 (eight) hours as needed for nausea or vomiting. 01/24/21   McDonough, Si Gaul, PA-C  rOPINIRole (REQUIP) 0.5 MG tablet Take 1 tablet (0.5 mg total) by mouth every evening. 10/20/20   Lavera Guise, MD  vitamin B-12 1000 MCG tablet Take 1 tablet (1,000 mcg total) by mouth daily. 12/30/20   Val Riles, MD  warfarin (COUMADIN) 2 MG tablet Take 1 tablet (2 mg total) by mouth daily. 12/06/20   McDonough, Si Gaul, PA-C    Allergies Patient has no known allergies.  Family History  Problem Relation Age of Onset  . Breast cancer Mother     Social History Social History   Tobacco Use  . Smoking status: Current Every Day Smoker    Packs/day: 1.00    Types: Cigarettes    Last attempt to quit: 01/03/2019    Years since quitting: 2.0  . Smokeless tobacco: Never Used  Vaping Use  . Vaping Use: Never used  Substance Use Topics  . Alcohol use: No  . Drug use: No    Review of Systems Constitutional: No  fever. Eyes: No visual changes. ENT: No sore throat. Cardiovascular: Denies chest pain. Respiratory: + shortness of breath. Gastrointestinal: No nausea, vomiting, diarrhea. Genitourinary: Negative for dysuria. Musculoskeletal: Negative for back pain. Skin: Negative for rash. Neurological: Negative for focal weakness or numbness.  ____________________________________________   PHYSICAL EXAM:  VITAL SIGNS: ED Triage Vitals [02/06/21 0434]  Enc Vitals Group     BP (!) 144/38     Pulse Rate 64     Resp (!) 22     Temp      Temp src      SpO2 96 %     Weight 125 lb (56.7 kg)     Height '4\' 10"'  (1.473 m)  Head Circumference      Peak Flow      Pain Score 0     Pain Loc      Pain Edu?      Excl. in [place]?    CONSTITUTIONAL: Alert and oriented and responds appropriately to questions.  Chronically ill-appearing HEAD: Normocephalic EYES: Conjunctivae clear, pupils appear equal, EOM appear intact ENT: normal nose; moist mucous membranes NECK: Supple, normal ROM CARD: Irregularly irregular; S1 and S2 appreciated; no murmurs, no clicks, no rubs, no gallops RESP: Patient is tachypneic.  She has inspiratory and expiratory wheezes heard diffusely with rhonchorous breath sounds.  No rales.  She is speaking in truncated sentences.  Sats 90% on room air at rest.  Placed on 2 L nasal cannula for comfort.  She does have increased work of breathing. ABD/GI: Normal bowel sounds; non-distended; soft, non-tender, no rebound, no guarding, no peritoneal signs, no hepatosplenomegaly BACK: The back appears normal EXT: Normal ROM in all joints; no deformity noted, no edema; no cyanosis, no calf tenderness or calf swelling SKIN: Normal color for age and race; warm; no rash on exposed skin NEURO: Moves all extremities equally PSYCH: The patient's mood and manner are appropriate.  ____________________________________________   LABS (all labs ordered are listed, but only abnormal results are  displayed)  Labs Reviewed  CBC WITH DIFFERENTIAL/PLATELET - Abnormal; Notable for the following components:      Result Value   RBC 3.22 (*)    Hemoglobin 9.0 (*)    HCT 27.0 (*)    RDW 22.8 (*)    All other components within normal limits  BASIC METABOLIC PANEL - Abnormal; Notable for the following components:   Potassium 5.4 (*)    CO2 18 (*)    Glucose, Bld 133 (*)    BUN 30 (*)    Creatinine, Ser 2.74 (*)    Calcium 8.7 (*)    GFR, Estimated 17 (*)    All other components within normal limits  RESP PANEL BY RT-PCR (FLU A&B, COVID) ARPGX2  BRAIN NATRIURETIC PEPTIDE  TROPONIN I (HIGH SENSITIVITY)   ____________________________________________  EKG   EKG Interpretation  Date/Time:  Sunday February 06 2021 04:39:35 EDT Ventricular Rate:  65 PR Interval:    QRS Duration: 92 QT Interval:  467 QTC Calculation: 486 R Axis:   97 Text Interpretation: Atrial fibrillation Right axis deviation Low voltage, extremity leads Borderline repolarization abnormality Borderline prolonged QT interval No significant change since last tracing Confirmed by Pryor Curia [phone]) on 02/06/2021 5:45:15 AM       ____________________________________________  RADIOLOGY Jessie Foot [place] Casalino, personally viewed and evaluated these images (plain radiographs) as part of my medical decision making, as well as reviewing the written report by the radiologist.  ED MD interpretation: Chest x-ray shows pulmonary edema versus atypical infection.  Official radiology report(s): DG Chest Portable 1 View  Result Date: 02/06/2021 CLINICAL DATA:  Shortness of breath and nasal congestion with wheezing EXAM: PORTABLE CHEST 1 VIEW COMPARISON:  12/23/2020 FINDINGS: Generalized interstitial prominence. There is chronic lung disease by prior imaging. Borderline heart size with stable mediastinal contours. Blunting of the costophrenic sulci similar to prior. No pneumothorax. IMPRESSION: Interstitial opacity correlating with  chronic lung disease on prior imaging. Superimposed pulmonary edema or atypical infection would easily be obscured. Electronically Signed   By: Monte Fantasia M.D.   On: 02/06/2021 05:30    ____________________________________________   PROCEDURES  Procedure(s) performed (including Critical Care):  Procedures  CRITICAL CARE Performed by:  Jeannifer Drakeford   Total critical care time: 45 minutes  Critical care time was exclusive of separately billable procedures and treating other patients.  Critical care was necessary to treat or prevent imminent or life-threatening deterioration.  Critical care was time spent personally by me on the following activities: development of treatment plan with patient and/or surrogate as well as nursing, discussions with consultants, evaluation of patient's response to treatment, examination of patient, obtaining history from patient or surrogate, ordering and performing treatments and interventions, ordering and review of laboratory studies, ordering and review of radiographic studies, pulse oximetry and re-evaluation of patient's condition.  ____________________________________________   INITIAL IMPRESSION / ASSESSMENT AND PLAN / ED COURSE  As part of my medical decision making, I reviewed the following data within the [place] notes reviewed and incorporated, Labs reviewed , EKG interpreted , Old EKG reviewed, Old chart reviewed, Radiograph reviewed , Discussed with admitting physician  and Notes from prior ED visits         Patient here with complaints of shortness of breath, wheezing.  Differential includes COPD exacerbation, pneumonia, CHF, COVID-19, ACS, PE.  Will obtain labs, chest x-ray, EKG.  Will give albuterol, Atrovent, Solu-Medrol and reassess.  ED PROGRESS  Patient's chest x-ray shows pulmonary edema versus atypical infection.  She has no fever or leukocytosis.  She is coughing up clear sputum.  I suspect this  is more likely CHF.  Will give Lasix.  She continues to wheeze.  We will continue breathing treatments.  Labs show slightly worsening renal function over the past several weeks with a potassium of 5.4 today with no EKG changes.  This should improve with albuterol and Lasix but will also give dose of Lokelma.  She is on the cardiac monitor.  Will discuss with hospitalist for admission for COPD and CHF exacerbations.  6:04 AM Discussed patient's case with hospitalist, Dr. Sidney Ace.  I have recommended admission and patient (and family if present) agree with this plan. Admitting physician will place admission orders.   I reviewed all nursing notes, vitals, pertinent previous records and reviewed/interpreted all EKGs, lab and urine results, imaging (as available).   ____________________________________________   FINAL CLINICAL IMPRESSION(S) / ED DIAGNOSES  Final diagnoses:  COPD exacerbation ([place])  Acute on chronic congestive heart failure, unspecified heart failure type ([place])  Hyperkalemia     ED Discharge Orders    None      *Please note:  [name] was evaluated in Emergency Department on 02/06/2021 for the symptoms described in the history of present illness. She was evaluated in the context of the global COVID-19 pandemic, which necessitated consideration that the patient might be at risk for infection with the SARS-CoV-2 virus that causes COVID-19. Institutional protocols and algorithms that pertain to the evaluation of patients at risk for COVID-19 are in a state of rapid change based on information released by regulatory bodies including the CDC and federal and state organizations. These policies and algorithms were followed during the patient's care in the ED.  Some ED evaluations and interventions may be delayed as a result of limited staffing during and the pandemic.*   Note:  This document was prepared using Dragon voice recognition software and may include unintentional dictation  errors.   Izella Ybanez, Delice Bison, DO 02/06/21 [phone]

## 2021-02-06 NOTE — ED Notes (Signed)
Pt transported to CT at this time.

## 2021-02-06 NOTE — Consult Note (Signed)
[facility]  CONSULT NOTE    Date: 02/06/2021                  Patient Name:  [name]  MRN: [id number]  DOB: 1941/09/20  Age / Sex: 80 y.o., female         PCP: Lavera Guise, MD                 Service Requesting Consult: Dr. Candis Shine                 Reason for Consult: Acute kidney injury            History of Present Illness: [name] admitted to [facility] with increasing shortness of breath, dyspnea on exertion, productive cough with clear sputum, nasal congestion and wheezing. Denies any peripheral edema. Patient has no been eating well. States her daughter was sick with bronchitis.   Creatinine elevated.    Medications: Outpatient medications: (Not in a hospital admission)   Current medications: Current Facility-Administered Medications  Medication Dose Route Frequency Provider Last Rate Last Admin  . 0.9 %  sodium chloride infusion  250 mL Intravenous PRN Agbata, Tochukwu, MD      . acetaminophen (TYLENOL) tablet 650 mg  650 mg Oral Q4H PRN Agbata, Tochukwu, MD      . atorvastatin (LIPITOR) tablet 10 mg  10 mg Oral QPM Agbata, Tochukwu, MD      . bisacodyl (DULCOLAX) EC tablet 5 mg  5 mg Oral Daily PRN Agbata, Tochukwu, MD      . citalopram (CELEXA) tablet 40 mg  40 mg Oral Daily Agbata, Tochukwu, MD      . donepezil (ARICEPT) tablet 10 mg  10 mg Oral QHS Agbata, Tochukwu, MD      . Derrill Memo ON 02/07/2021] ferrous sulfate tablet 325 mg  325 mg Oral Q breakfast Agbata, Tochukwu, MD      . fluticasone (FLONASE) 50 MCG/ACT nasal spray 1 spray  1 spray Each Nare Daily Agbata, Tochukwu, MD      . furosemide (LASIX) injection 60 mg  60 mg Intravenous Q12H Agbata, Tochukwu, MD      . insulin aspart (novoLOG) injection 0-9 Units  0-9 Units Subcutaneous TID WC Agbata, Tochukwu, MD      . ipratropium-albuterol (DUONEB) 0.5-2.5 (3) MG/3ML nebulizer solution 3 mL  3 mL Nebulization Q6H PRN Agbata, Tochukwu, MD      . lisinopril (ZESTRIL) tablet 20  mg  20 mg Oral Daily Agbata, Tochukwu, MD      . nicotine (NICODERM CQ - dosed in mg/24 hours) patch 14 mg  14 mg Transdermal Daily Agbata, Tochukwu, MD      . ondansetron (ZOFRAN) injection 4 mg  4 mg Intravenous Q6H PRN Agbata, Tochukwu, MD      . pantoprazole (PROTONIX) EC tablet 40 mg  40 mg Oral BID Agbata, Tochukwu, MD      . polyethylene glycol (MIRALAX / GLYCOLAX) packet 17 g  17 g Oral Daily Agbata, Tochukwu, MD      . rOPINIRole (REQUIP) tablet 0.5 mg  0.5 mg Oral QPM Agbata, Tochukwu, MD      . sodium chloride flush (NS) 0.9 % injection 3 mL  3 mL Intravenous Q12H Agbata, Tochukwu, MD      . sodium chloride flush (NS) 0.9 % injection 3 mL  3 mL Intravenous PRN Agbata, Tochukwu, MD      . vitamin B-12 (CYANOCOBALAMIN) tablet 1,000 mcg  1,000 mcg Oral Daily Agbata, Tochukwu, MD      . warfarin (COUMADIN) tablet 2 mg  2 mg Oral Daily Agbata, Tochukwu, MD       Current Outpatient Medications  Medication Sig Dispense Refill  . amLODipine (NORVASC) 10 MG tablet Take 1 tablet (10 mg total) by mouth daily. 30 tablet 3  . atorvastatin (LIPITOR) 10 MG tablet Take 1 tablet (10 mg total) by mouth every evening. 90 tablet 1  . bisacodyl (DULCOLAX) 5 MG EC tablet Take 1 tablet (5 mg total) by mouth daily as needed for moderate constipation. 30 tablet 0  . Blood Pressure Monitor KIT Use three times daily to check blood pressure  DX:  I50.42 1 kit 1  . citalopram (CELEXA) 40 MG tablet Take 1 tablet (40 mg total) by mouth daily. 90 tablet 1  . digoxin (LANOXIN) 0.125 MG tablet Take 1 tablet (0.125 mg total) by mouth daily. 90 tablet 1  . donepezil (ARICEPT) 5 MG tablet TAKE 1 TABLET BY MOUTH ONCE DAILY FOR MEMORY 90 tablet 2  . ferrous sulfate 325 (65 FE) MG tablet Take 1 tablet (325 mg total) by mouth daily with breakfast.  3  . fluticasone (FLONASE) 50 MCG/ACT nasal spray Place 1 spray into both nostrils daily. 16 mL 0  . hydrALAZINE (APRESOLINE) 50 MG tablet Take 1 tablet by mouth twice a day, may  take an extra 50 mg if Bp is elevated.  Hold if SBP less than 130 mmHg 60 tablet 3  . insulin glargine, 2 Unit Dial, (TOUJEO MAX SOLOSTAR) 300 UNIT/ML Solostar Pen Take 6 to 10 units with supper( needs needles as well e 11.65 6 mL 3  . lisinopril (ZESTRIL) 20 MG tablet Take 1 tablet (20 mg total) by mouth daily. 30 tablet 3  . pantoprazole (PROTONIX) 40 MG tablet Take 1 tablet (40 mg total) by mouth 2 (two) times daily. 180 tablet 1  . polyethylene glycol (MIRALAX) 17 g packet Take 17 g by mouth daily. 14 each 0  . promethazine (PHENERGAN) 12.5 MG tablet Take 1 tablet (12.5 mg total) by mouth every 8 (eight) hours as needed for nausea or vomiting. 20 tablet 0  . rOPINIRole (REQUIP) 0.5 MG tablet Take 1 tablet (0.5 mg total) by mouth every evening. 90 tablet 1  . vitamin B-12 1000 MCG tablet Take 1 tablet (1,000 mcg total) by mouth daily.    Marland Kitchen warfarin (COUMADIN) 2 MG tablet Take 1 tablet (2 mg total) by mouth daily. 30 tablet 3      Allergies: No Known Allergies    Past Medical History: Past Medical History:  Diagnosis Date  . Anemia   . Atrial fibrillation ([place])   . Congestive heart failure (CHF) ([place])   . Diabetes mellitus without complication ([place])   . GERD (gastroesophageal reflux disease)   . Hyperlipidemia   . Hypertension      Past Surgical History: Past Surgical History:  Procedure Laterality Date  . ABDOMINAL HYSTERECTOMY    . APPENDECTOMY    . CATARACT EXTRACTION    . CHOLECYSTECTOMY    . HERNIA REPAIR    . right knee replacement    . right nephroectomy       Family History: Family History  Problem Relation Age of Onset  . Breast cancer Mother      Social History: Social History   Socioeconomic History  . Marital status: Widowed    Spouse name: Not on file  . Number of children: Not  on file  . Years of education: Not on file  . Highest education level: Not on file  Occupational History  . Not on file  Tobacco Use  . Smoking status: Current Every  Day Smoker    Packs/day: 1.00    Types: Cigarettes    Last attempt to quit: 01/03/2019    Years since quitting: 2.0  . Smokeless tobacco: Never Used  Vaping Use  . Vaping Use: Never used  Substance and Sexual Activity  . Alcohol use: No  . Drug use: No  . Sexual activity: Not on file  Other Topics Concern  . Not on file  Social History Narrative  . Not on file   Social Determinants of Health   Financial Resource Strain: Not on file  Food Insecurity: Not on file  Transportation Needs: Not on file  Physical Activity: Not on file  Stress: Not on file  Social Connections: Not on file  Intimate Partner Violence: Not on file     Review of Systems: Review of Systems  Constitutional: Negative.   HENT: Negative.   Eyes: Negative.   Respiratory: Positive for cough, hemoptysis, sputum production, shortness of breath and wheezing.   Cardiovascular: Positive for PND. Negative for chest pain, palpitations, orthopnea, claudication and leg swelling.  Gastrointestinal: Negative.   Genitourinary: Negative for dysuria, flank pain, frequency, hematuria and urgency.  Musculoskeletal: Negative for back pain, falls, joint pain, myalgias and neck pain.  Skin: Negative.   Neurological: Negative.   Endo/Heme/Allergies: Negative.   Psychiatric/Behavioral: Negative.     Vital Signs: Blood pressure (!) 119/51, pulse 73, temperature 97.8 F (36.6 C), temperature source Oral, resp. rate (!) 25, height 4' 10" (1.473 m), weight 56.7 kg, SpO2 98 %.  Weight trends: Filed Weights   02/06/21 0434  Weight: 56.7 kg    Physical Exam: General: NAD,   Head: Normocephalic, atraumatic. Moist oral mucosal membranes  Eyes: Anicteric, PERRL  Neck: Supple, trachea midline  Lungs:  +wheezing bilaterally  Heart: Regular rate and rhythm  Abdomen:  Soft, nontender,   Extremities: no peripheral edema.  Neurologic: Nonfocal, moving all four extremities  Skin: No lesions        Lab results: Basic  Metabolic Panel: Recent Labs  Lab 02/06/21 0503  NA 136  K 5.4*  CL 110  CO2 18*  GLUCOSE 133*  BUN 30*  CREATININE 2.74*  CALCIUM 8.7*    Liver Function Tests: No results for input(s): AST, ALT, ALKPHOS, BILITOT, PROT, ALBUMIN in the last 168 hours. No results for input(s): LIPASE, AMYLASE in the last 168 hours. No results for input(s): AMMONIA in the last 168 hours.  CBC: Recent Labs  Lab 02/06/21 0503  WBC 8.6  NEUTROABS 6.0  HGB 9.0*  HCT 27.0*  MCV 83.9  PLT 237    Cardiac Enzymes: No results for input(s): CKTOTAL, CKMB, CKMBINDEX, TROPONINI in the last 168 hours.  BNP: Invalid input(s): POCBNP  CBG: No results for input(s): GLUCAP in the last 168 hours.  Microbiology: Results for orders placed or performed during the hospital encounter of 02/06/21  Resp Panel by RT-PCR (Flu A&B, Covid) Nasopharyngeal Swab     Status: None   Collection Time: 02/06/21  5:03 AM   Specimen: Nasopharyngeal Swab; Nasopharyngeal(NP) swabs in vial transport medium  Result Value Ref Range Status   SARS Coronavirus 2 by RT PCR NEGATIVE NEGATIVE Final    Comment: (NOTE) SARS-CoV-2 target nucleic acids are NOT DETECTED.  The SARS-CoV-2 RNA is generally detectable in  upper respiratory specimens during the acute phase of infection. The lowest concentration of SARS-CoV-2 viral copies this assay can detect is 138 copies/mL. A negative result does not preclude SARS-Cov-2 infection and should not be used as the sole basis for treatment or other patient management decisions. A negative result may occur with  improper specimen collection/handling, submission of specimen other than nasopharyngeal swab, presence of viral mutation(s) within the areas targeted by this assay, and inadequate number of viral copies(<138 copies/mL). A negative result must be combined with clinical observations, patient history, and epidemiological information. The expected result is Negative.  Fact Sheet for  Patients:  EntrepreneurPulse.com.au  Fact Sheet for Healthcare Providers:  IncredibleEmployment.be  This test is no t yet approved or cleared by the [place] FDA and  has been authorized for detection and/or diagnosis of SARS-CoV-2 by FDA under an Emergency Use Authorization (EUA). This EUA will remain  in effect (meaning this test can be used) for the duration of the COVID-19 declaration under Section 564(b)(1) of the Act, 21 U.S.C.section 360bbb-3(b)(1), unless the authorization is terminated  or revoked sooner.       Influenza A by PCR NEGATIVE NEGATIVE Final   Influenza B by PCR NEGATIVE NEGATIVE Final    Comment: (NOTE) The Xpert Xpress SARS-CoV-2/FLU/RSV plus assay is intended as an aid in the diagnosis of influenza from Nasopharyngeal swab specimens and should not be used as a sole basis for treatment. Nasal washings and aspirates are unacceptable for Xpert Xpress SARS-CoV-2/FLU/RSV testing.  Fact Sheet for Patients: EntrepreneurPulse.com.au  Fact Sheet for Healthcare Providers: IncredibleEmployment.be  This test is not yet approved or cleared by the [place] FDA and has been authorized for detection and/or diagnosis of SARS-CoV-2 by FDA under an Emergency Use Authorization (EUA). This EUA will remain in effect (meaning this test can be used) for the duration of the COVID-19 declaration under Section 564(b)(1) of the Act, 21 U.S.C. section 360bbb-3(b)(1), unless the authorization is terminated or revoked.  Performed at [facility], [address] [place], [place] [address]     Coagulation Studies: No results for input(s): LABPROT, INR in the last 72 hours.  Urinalysis: No results for input(s): COLORURINE, LABSPEC, PHURINE, GLUCOSEU, HGBUR, BILIRUBINUR, KETONESUR, PROTEINUR, UROBILINOGEN, NITRITE, LEUKOCYTESUR in the last 72 hours.  Invalid input(s): APPERANCEUR     Imaging: CT CHEST WO CONTRAST  Result Date: 02/06/2021 CLINICAL DATA:  Shortness of breath, congestion.  History of COPD. EXAM: CT CHEST WITHOUT CONTRAST TECHNIQUE: Multidetector CT imaging of the chest was performed following the standard protocol without IV contrast. COMPARISON:  Chest x-ray from earlier same day. Chest CT dated 01/03/2019. FINDINGS: Cardiovascular: Borderline cardiomegaly. Aortic atherosclerosis. No thoracic aortic aneurysm. No significant pericardial effusion. Coronary artery calcifications, particularly dense within the LEFT anterior descending coronary artery. Mediastinum/Nodes: Moderately enlarged lymph nodes within the mediastinum, including a 1.3 cm short axis lymph node within the pre-vascular space and a 1.2 cm short axis lymph node within the precarinal space. These lymph nodes are not significantly changed compared to the previous study suggesting benign/reactive lymphadenopathy. No new enlarged or morphologically abnormal lymph nodes. Esophagus is unremarkable.  Trachea is unremarkable. Lungs/Pleura: New bilateral pleural effusions, moderate-sized, with adjacent compressive atelectasis. Additional areas of subpleural reticulation and ground-glass opacities within the upper lobes, most compatible with chronic interstitial lung disease/fibrosis. No evidence of a superimposed consolidating pneumonia. Upper Abdomen: Limited images of the upper abdomen are unremarkable. Musculoskeletal: Degenerative spondylosis of the kyphotic thoracic spine, mild to moderate in degree. No  acute appearing osseous abnormality. Ill-defined fluid/edema within the subcutaneous soft tissues of the lower chest and upper abdomen indicating some degree of anasarca. IMPRESSION: 1. New bilateral pleural effusions, moderate-sized, with adjacent compressive atelectasis. 2. Additional areas of subpleural reticulation and ground-glass opacities within the bilateral upper lobes, most compatible with NSIP,  hypersensitivity pneumonitis and/or respiratory bronchiolitis. No evidence of a superimposed consolidating pneumonia. 3. Coronary artery calcifications, particularly dense within the LEFT anterior descending coronary artery. Recommend correlation with any possible associated cardiac symptoms. 4. Anasarca. Aortic Atherosclerosis (ICD10-I70.0). Electronically Signed   By: Franki Cabot M.D.   On: 02/06/2021 09:18   DG Chest Portable 1 View  Result Date: 02/06/2021 CLINICAL DATA:  Shortness of breath and nasal congestion with wheezing EXAM: PORTABLE CHEST 1 VIEW COMPARISON:  12/23/2020 FINDINGS: Generalized interstitial prominence. There is chronic lung disease by prior imaging. Borderline heart size with stable mediastinal contours. Blunting of the costophrenic sulci similar to prior. No pneumothorax. IMPRESSION: Interstitial opacity correlating with chronic lung disease on prior imaging. Superimposed pulmonary edema or atypical infection would easily be obscured. Electronically Signed   By: Monte Fantasia M.D.   On: 02/06/2021 05:30      Assessment & Plan: [name] is a 80 y.o. white female with right nephrectomy, diastolic congestive heart failure hypertension, hyperlipidemia, GERD, insulin dependent diabetes mellitus type II, atrial fibrillation, anemia, depression, dementia who was admitted to [facility] on 02/06/2021 for Acute CHF (congestive heart failure) ([place]) [I50.9]  1. Acute kidney injury with hyperkalemia and metabolic acidosis on chronic kidney disease stage IV with proteinuria: Baseline creatinine of  2.63, GFR of 18 on 01/20/2021.  Chronic kidney disease secondary to solitary kidney, diabetes and hypertension.  No recent IV contrast exposure.  On lisinopril.  - Check renal ultrasound - repeat urine studies - Hold lisinopril - Check SPEP/UPEP, viral hepatitis screen  2. Hypertension and acute exacerbation of chronic diastolic congestive heart failure:  - IV furosemide.   3.  Diabetes mellitus type II with chronic kidney disease: insulin dependent. Hemoglobin A1c of 6.9% on 12/24/20.   4. Anemia of chronic kidney disease: normocytic. Hemoglobin 9.  - Check iron studies.      LOS: 0   4/17/202211:31 AM

## 2021-02-06 NOTE — ED Triage Notes (Signed)
Pt complains of shob, nasal congestion. Pt with wheezing noted. ra pox 91%. Pt placed on 2lpm, md at bedside.

## 2021-02-06 NOTE — ED Notes (Signed)
Labs sent. Grey top, blue top, red top, and 1 set of cultures sent and held.

## 2021-02-06 NOTE — ED Notes (Signed)
Called pharmacy tech to verified pt's home medications at this time.

## 2021-02-06 NOTE — ED Notes (Signed)
Pt clean and dry at this time. New pad and brief changed. Purewick placed on this pt.

## 2021-02-06 NOTE — ED Notes (Signed)
Given meal tray and helped set up

## 2021-02-06 NOTE — H&P (Addendum)
History and Physical    [name]:[id number] DOB: Aug 18, 1941 DOA: 02/06/2021  PCP: Lavera Guise, MD   Patient coming from: Home  I have personally briefly reviewed patient's old medical records in [place]  Chief Complaint: Shortness of breath  HPI: [name] is a 80 y.o. female with medical history significant for chronic diastolic dysfunction CHF, diabetes mellitus with chronic kidney disease, atrial fibrillation, nicotine dependence and hypertension who presents to the ER for evaluation of shortness of breath, cough productive of clear phlegm, nasal congestion and wheezing. Patient states that she has had symptoms for a couple of weeks but it got worse on the night prior to her admission. Patient states that she had difficulty catching her breath and was unable to lay flat in bed and so she called EMS. She denies having any fever or chills, she has no leg swelling, no nausea, no vomiting, no chest pain, no dizziness, no lightheadedness, no diaphoresis, no palpitations, no headache, no abdominal pain, no changes in her bowel or urinary symptoms. Per EMS patient had room air pulse oximetry between 90 and 91%. Labs show sodium 136, potassium 5.4, chloride 110, bicarb 18, glucose 133, creatinine 3.74, calcium 8.7, BNP 233.7, troponin 12 >> 11, white count 8.6, hemoglobin 9.0, hematocrit 27, MCV 83.9, RDW 22.8, platelet count 237 Respiratory viral panel is negative Chest x-ray shows presented to interstitial opacity correlating with chronic lung disease on prior imaging. Superimposed pulmonary edema or atypical infection would easily be obscured. CT scan of the chest without contrast shows new bilateral pleural effusions, moderate-sized, with adjacent compressive atelectasis. Additional areas of subpleural reticulation and ground-glass opacities within the bilateral upper lobes, most compatible with NSIP, hypersensitivity pneumonitis and/or respiratory bronchiolitis. No  evidence of a superimposed consolidating pneumonia. Coronary artery calcifications, particularly dense within the LEFT anterior descending coronary artery. Recommend correlation with any possible associated cardiac symptoms. Anasarca. Aortic Atherosclerosis  Twelve-lead EKG reviewed by me shows atrial fibrillation with right axis deviation and low voltage QRS.    ED Course: Patient is a 80 year old female who presents to the ER for evaluation of worsening shortness of breath associated with a cough productive of clear phlegm. Upon arrival to the ER patient was found to have room air pulse oximetry of 91% on 2 L of oxygen via nasal cannula. She received IV Lasix and nebulizers in the ER and will be admitted to the hospital for further evaluation and treatment.    Review of Systems: As per HPI otherwise all other systems reviewed and negative.    Past Medical History:  Diagnosis Date  . Anemia   . Atrial fibrillation ([place])   . Congestive heart failure (CHF) ([place])   . Diabetes mellitus without complication ([place])   . GERD (gastroesophageal reflux disease)   . Hyperlipidemia   . Hypertension     Past Surgical History:  Procedure Laterality Date  . ABDOMINAL HYSTERECTOMY    . APPENDECTOMY    . CATARACT EXTRACTION    . CHOLECYSTECTOMY    . HERNIA REPAIR    . right knee replacement    . right nephroectomy       reports that she has been smoking cigarettes. She has been smoking about 1.00 pack per day. She has never used smokeless tobacco. She reports that she does not drink alcohol and does not use drugs.  No Known Allergies  Family History  Problem Relation Age of Onset  . Breast cancer Mother  Prior to Admission medications   Medication Sig Start Date End Date Taking? Authorizing Provider  amLODipine (NORVASC) 10 MG tablet Take 1 tablet (10 mg total) by mouth daily. 01/13/21   Lavera Guise, MD  atorvastatin (LIPITOR) 10 MG tablet Take 1 tablet (10 mg total) by mouth  every evening. 12/06/20   McDonough, Si Gaul, PA-C  bisacodyl (DULCOLAX) 5 MG EC tablet Take 1 tablet (5 mg total) by mouth daily as needed for moderate constipation. 12/29/20   Val Riles, MD  Blood Pressure Monitor KIT Use three times daily to check blood pressure  DX:  I50.42 01/13/21   Lavera Guise, MD  citalopram (CELEXA) 40 MG tablet MG tablet Take 1 tablet (40 mg total) by mouth daily. 10/20/20   Lavera Guise, MD  digoxin (LANOXIN) 0.125 MG tablet Take 1 tablet (0.125 mg total) by mouth daily. 12/06/20   McDonough, Lauren K, PA-C  donepezil (ARICEPT) 5 MG tablet TAKE 1 TABLET BY MOUTH ONCE DAILY FOR MEMORY 12/06/20   McDonough, Si Gaul, PA-C  ferrous sulfate 325 (65 FE) MG tablet Take 1 tablet (325 mg total) by mouth daily with breakfast. 12/30/20   Val Riles, MD  fluticasone (FLONASE) 50 MCG/ACT nasal spray Place 1 spray into both nostrils daily. 02/03/21   McDonough, Si Gaul, PA-C  hydrALAZINE (APRESOLINE) 50 MG tablet Take 1 tablet by mouth twice a day, may take an extra 50 mg if Bp is elevated.  Hold if SBP less than 130 mmHg 01/13/21   Lavera Guise, MD  insulin glargine, 2 Unit Dial, (TOUJEO MAX SOLOSTAR) 300 UNIT/ML Solostar Pen Take 6 to 10 units with supper( needs needles as well e 11.65 12/20/20   Lavera Guise, MD  lisinopril (ZESTRIL) 20 MG tablet Take 1 tablet (20 mg total) by mouth daily. 01/13/21   Lavera Guise, MD  pantoprazole (PROTONIX) 40 MG tablet Take 1 tablet (40 mg total) by mouth 2 (two) times daily. 10/20/20   Lavera Guise, MD  polyethylene glycol (MIRALAX) 17 g packet Take 17 g by mouth daily. 12/29/20   Val Riles, MD  promethazine (PHENERGAN) 12.5 MG tablet Take 1 tablet (12.5 mg total) by mouth every 8 (eight) hours as needed for nausea or vomiting. 01/24/21   McDonough, Si Gaul, PA-C  rOPINIRole (REQUIP) 0.5 MG tablet Take 1 tablet (0.5 mg total) by mouth every evening. 10/20/20   Lavera Guise, MD  vitamin B-12 1000 MCG tablet Take 1 tablet (1,000 mcg total) by  mouth daily. 12/30/20   Val Riles, MD  warfarin (COUMADIN) 2 MG tablet Take 1 tablet (2 mg total) by mouth daily. 12/06/20   Mylinda Latina, PA-C    Physical Exam: Vitals:   02/06/21 0440 02/06/21 0600 02/06/21 0700 02/06/21 0755  BP:  (!) 126/41 (!) 125/52 (!) 135/36  Pulse:  65 65 72  Resp:  20 20 (!) 26  Temp: 97.8 F (36.6 C)     TempSrc: Oral     SpO2:  99% 99% 100%  Weight:      Height:         Vitals:   02/06/21 0440 02/06/21 0600 02/06/21 0700 02/06/21 0755  BP:  (!) 126/41 (!) 125/52 (!) 135/36  Pulse:  65 65 72  Resp:  20 20 (!) 26  Temp: 97.8 F (36.6 C)     TempSrc: Oral     SpO2:  99% 99% 100%  Weight:      Height:  Constitutional: Alert and oriented x 3 . Not in any apparent distress HEENT:      Head: Normocephalic and atraumatic.         Eyes: PERLA, EOMI, Conjunctivae are normal. Sclera is non-icteric.       Mouth/Throat: Mucous membranes are moist.       Neck: Supple with no signs of meningismus. Cardiovascular:  Irregularly irregular. No murmurs, gallops, or rubs. 2+ symmetrical distal pulses are present . No JVD. No LE edema Respiratory: Respiratory effort normal. Scattered rhonchi and wheezes bilaterally. Crackles at the lung bases Gastrointestinal: Soft, non tender, and non distended with positive bowel sounds.  Genitourinary: No CVA tenderness. Musculoskeletal: Nontender with normal range of motion in all extremities. No cyanosis, or erythema of extremities. Neurologic:  Face is symmetric. Moving all extremities. No gross focal neurologic deficits . Skin: Skin is warm, dry.  No rash or ulcers Psychiatric: Mood and affect are normal   Labs on Admission: I have personally reviewed following labs and imaging studies  CBC: Recent Labs  Lab 02/06/21 0503  WBC 8.6  NEUTROABS 6.0  HGB 9.0*  HCT 27.0*  MCV 83.9  PLT 071   Basic Metabolic Panel: Recent Labs  Lab 02/06/21 0503  NA 136  K 5.4*  CL 110  CO2 18*  GLUCOSE  133*  BUN 30*  CREATININE 2.74*  CALCIUM 8.7*   GFR: Estimated Creatinine Clearance: 12.4 mL/min (A) (by C-G formula based on SCr of 2.74 mg/dL (H)). Liver Function Tests: No results for input(s): AST, ALT, ALKPHOS, BILITOT, PROT, ALBUMIN in the last 168 hours. No results for input(s): LIPASE, AMYLASE in the last 168 hours. No results for input(s): AMMONIA in the last 168 hours. Coagulation Profile: No results for input(s): INR, PROTIME in the last 168 hours. Cardiac Enzymes: No results for input(s): CKTOTAL, CKMB, CKMBINDEX, TROPONINI in the last 168 hours. BNP (last 3 results) No results for input(s): PROBNP in the last 8760 hours. HbA1C: No results for input(s): HGBA1C in the last 72 hours. CBG: No results for input(s): GLUCAP in the last 168 hours. Lipid Profile: No results for input(s): CHOL, HDL, LDLCALC, TRIG, CHOLHDL, LDLDIRECT in the last 72 hours. Thyroid Function Tests: No results for input(s): TSH, T4TOTAL, FREET4, T3FREE, THYROIDAB in the last 72 hours. Anemia Panel: No results for input(s): VITAMINB12, FOLATE, FERRITIN, TIBC, IRON, RETICCTPCT in the last 72 hours. Urine analysis:    Component Value Date/Time   COLORURINE YELLOW (A) 12/23/2020 1040   APPEARANCEUR HAZY (A) 12/23/2020 1040   APPEARANCEUR Cloudy (A) 03/24/2020 0000   LABSPEC 1.013 12/23/2020 1040   PHURINE 6.0 12/23/2020 1040   GLUCOSEU 150 (A) 12/23/2020 1040   HGBUR MODERATE (A) 12/23/2020 1040   BILIRUBINUR NEGATIVE 12/23/2020 1040   BILIRUBINUR Negative 03/24/2020 0000   KETONESUR NEGATIVE 12/23/2020 1040   PROTEINUR >=300 (A) 12/23/2020 1040   UROBILINOGEN 0.2 09/02/2019 1441   NITRITE NEGATIVE 12/23/2020 1040   LEUKOCYTESUR NEGATIVE 12/23/2020 1040    Radiological Exams on Admission: CT CHEST WO CONTRAST  Result Date: 02/06/2021 CLINICAL DATA:  Shortness of breath, congestion.  History of COPD. EXAM: CT CHEST WITHOUT CONTRAST TECHNIQUE: Multidetector CT imaging of the chest was  performed following the standard protocol without IV contrast. COMPARISON:  Chest x-ray from earlier same day. Chest CT dated 01/03/2019. FINDINGS: Cardiovascular: Borderline cardiomegaly. Aortic atherosclerosis. No thoracic aortic aneurysm. No significant pericardial effusion. Coronary artery calcifications, particularly dense within the LEFT anterior descending coronary artery. Mediastinum/Nodes: Moderately enlarged lymph nodes within the  mediastinum, including a 1.3 cm short axis lymph node within the pre-vascular space and a 1.2 cm short axis lymph node within the precarinal space. These lymph nodes are not significantly changed compared to the previous study suggesting benign/reactive lymphadenopathy. No new enlarged or morphologically abnormal lymph nodes. Esophagus is unremarkable.  Trachea is unremarkable. Lungs/Pleura: New bilateral pleural effusions, moderate-sized, with adjacent compressive atelectasis. Additional areas of subpleural reticulation and ground-glass opacities within the upper lobes, most compatible with chronic interstitial lung disease/fibrosis. No evidence of a superimposed consolidating pneumonia. Upper Abdomen: Limited images of the upper abdomen are unremarkable. Musculoskeletal: Degenerative spondylosis of the kyphotic thoracic spine, mild to moderate in degree. No acute appearing osseous abnormality. Ill-defined fluid/edema within the subcutaneous soft tissues of the lower chest and upper abdomen indicating some degree of anasarca. IMPRESSION: 1. New bilateral pleural effusions, moderate-sized, with adjacent compressive atelectasis. 2. Additional areas of subpleural reticulation and ground-glass opacities within the bilateral upper lobes, most compatible with NSIP, hypersensitivity pneumonitis and/or respiratory bronchiolitis. No evidence of a superimposed consolidating pneumonia. 3. Coronary artery calcifications, particularly dense within the LEFT anterior descending coronary  artery. Recommend correlation with any possible associated cardiac symptoms. 4. Anasarca. Aortic Atherosclerosis (ICD10-I70.0). Electronically Signed   By: Franki Cabot M.D.   On: 02/06/2021 09:18   DG Chest Portable 1 View  Result Date: 02/06/2021 CLINICAL DATA:  Shortness of breath and nasal congestion with wheezing EXAM: PORTABLE CHEST 1 VIEW COMPARISON:  12/23/2020 FINDINGS: Generalized interstitial prominence. There is chronic lung disease by prior imaging. Borderline heart size with stable mediastinal contours. Blunting of the costophrenic sulci similar to prior. No pneumothorax. IMPRESSION: Interstitial opacity correlating with chronic lung disease on prior imaging. Superimposed pulmonary edema or atypical infection would easily be obscured. Electronically Signed   By: Monte Fantasia M.D.   On: 02/06/2021 05:30     Assessment/Plan Principal Problem:   Acute on chronic diastolic CHF (congestive heart failure) (HCC) Active Problems:   Atrial fibrillation (HCC)   Type 2 diabetes mellitus with stage 5 chronic kidney disease (HCC)   Nicotine dependence   History of anemia due to chronic kidney disease   Hyperkalemia     Acute on chronic diastolic dysfunction CHF Patient presents for evaluation of worsening shortness of breath and imaging shows moderate bilateral pleural effusions. Patient's last 2D echocardiogram showed an LVEF of 60 to 65% with mild aortic stenosis Place patient on Lasix 60 mg IV BID.   Continue lisinopril Maintain low-sodium diet    Atrial fibrillation Obtain digoxin levels prior to resumption Patient is on Coumadin as primary prophylaxis for an acute stroke. PT/INR results pending    Hyperkalemia Most likely related to ACE inhibitor use as well as worsening renal failure Expect improvement following administration of diuretic therapy    Diabetes mellitus with stage V chronic kidney disease Maintain consistent carbohydrate diet Glycemic control with  sliding scale insulin We will request nephrology consult for worsening renal failure Patient has hyperkalemia, metabolic acidosis and fluid overload, and will need conversation about renal replacement therapy     Anemia of chronic kidney disease H&H is stable Continue iron supplements   Nicotine dependence Smoking cessation has been discussed with patient in detail We will place patient on a nicotine transdermal patch 14 mg daily    Mild cognitive dysfunction Continue Aricept and Celexa    DVT prophylaxis: Coumadin Code Status: full code Family Communication: Greater than 50% of time was spent discussing patient's condition and plan of care with her at  the bedside.  All questions and concerns have been addressed.  She verbalizes understanding and agrees with the plan. Disposition Plan: Back to previous home environment Consults called: Nephrology Status: At the time of admission, it appears that the appropriate admission status for this patient is inpatient. This is judged to be reasonable and necessary in order to provide the required intensity of service to ensure the patient's safety given the presenting symptoms, physical exam findings, and initial radiographic and laboratory data in the context of their comorbid conditions. Patient requires inpatient status due to high intensity of service, high risk for further deterioration and high frequency of surveillance required.    Collier Bullock MD [facility]     02/06/2021, 10:08 AM

## 2021-02-07 DIAGNOSIS — D631 Anemia in chronic kidney disease: Secondary | ICD-10-CM | POA: Insufficient documentation

## 2021-02-07 DIAGNOSIS — E875 Hyperkalemia: Secondary | ICD-10-CM

## 2021-02-07 DIAGNOSIS — I4821 Permanent atrial fibrillation: Secondary | ICD-10-CM

## 2021-02-07 LAB — BASIC METABOLIC PANEL
Anion gap: 10 (ref 5–15)
BUN: 43 mg/dL — ABNORMAL HIGH (ref 8–23)
CO2: 17 mmol/L — ABNORMAL LOW (ref 22–32)
Calcium: 8.5 mg/dL — ABNORMAL LOW (ref 8.9–10.3)
Chloride: 107 mmol/L (ref 98–111)
Creatinine, Ser: 3.19 mg/dL — ABNORMAL HIGH (ref 0.44–1.00)
GFR, Estimated: 14 mL/min — ABNORMAL LOW (ref >60)
Glucose, Bld: 144 mg/dL — ABNORMAL HIGH (ref 70–99)
Potassium: 5.2 mmol/L — ABNORMAL HIGH (ref 3.5–5.1)
Sodium: 134 mmol/L — ABNORMAL LOW (ref 135–145)

## 2021-02-07 LAB — CBG MONITORING, ED
Glucose-Capillary: 124 mg/dL — ABNORMAL HIGH (ref 70–99)
Glucose-Capillary: 121 mg/dL — ABNORMAL HIGH (ref 70–99)
Glucose-Capillary: 108 mg/dL — ABNORMAL HIGH (ref 70–99)
Glucose-Capillary: 177 mg/dL — ABNORMAL HIGH (ref 70–99)

## 2021-02-07 LAB — KAPPA/LAMBDA LIGHT CHAINS
Kappa free light chain: 205.6 mg/L — ABNORMAL HIGH (ref 3.3–19.4)
Kappa, lambda light chain ratio: 1.97 — ABNORMAL HIGH (ref 0.26–1.65)
Lambda free light chains: 104.2 mg/L — ABNORMAL HIGH (ref 5.7–26.3)

## 2021-02-07 MED ORDER — MELATONIN 5 MG PO TABS
2.5000 mg | ORAL_TABLET | Freq: Every evening | ORAL | Status: DC | PRN
  Administered 2021-02-08 – 2021-02-09 (×3): 2.5 mg via ORAL
  Filled 2021-02-07 (×3): qty 1

## 2021-02-07 MED ORDER — WARFARIN SODIUM 2 MG PO TABS
2.0000 mg | ORAL_TABLET | Freq: Every day | ORAL | Status: DC
  Administered 2021-02-07 – 2021-02-10 (×4): 2 mg via ORAL
  Filled 2021-02-07 (×5): qty 1

## 2021-02-07 MED ORDER — SODIUM CHLORIDE 0.9 % IV SOLN
300.0000 mg | Freq: Once | INTRAVENOUS | Status: AC
  Administered 2021-02-07: 300 mg via INTRAVENOUS
  Filled 2021-02-07: qty 15

## 2021-02-07 MED ORDER — PREDNISONE 20 MG PO TABS
40.0000 mg | ORAL_TABLET | Freq: Every day | ORAL | Status: DC
  Administered 2021-02-07 – 2021-02-10 (×4): 40 mg via ORAL
  Filled 2021-02-07 (×4): qty 2

## 2021-02-07 MED ORDER — SODIUM BICARBONATE 8.4 % IV SOLN
INTRAVENOUS | Status: AC
  Filled 2021-02-07 (×7): qty 1000

## 2021-02-07 MED ORDER — SODIUM ZIRCONIUM CYCLOSILICATE 5 G PO PACK
5.0000 g | PACK | Freq: Once | ORAL | Status: AC
  Administered 2021-02-07: 5 g via ORAL
  Filled 2021-02-07: qty 1

## 2021-02-07 NOTE — Progress Notes (Signed)
PROGRESS NOTE    [name]  TSV:[id number] DOB: 1941/08/01 DOA: 02/06/2021 PCP: Lavera Guise, MD   Chief complaint.  Shortness of breath. Brief Narrative:   [name] is a 80 y.o. female with medical history significant for chronic diastolic dysfunction CHF, diabetes mellitus with chronic kidney disease, atrial fibrillation, nicotine dependence and hypertension who presents to the ER for evaluation of shortness of breath, cough productive of clear phlegm, nasal congestion and wheezing. In the emergency room, her chest x-ray showed chronic interstitial changes with possible superimposed pulmonary edema.  CT chest showed bilateral moderate pleural effusion, some groundglass changes with bilateral pleural effusion.  Assessment & Plan:   Principal Problem:   Acute on chronic diastolic CHF (congestive heart failure) (HCC) Active Problems:   Atrial fibrillation (HCC)   Type 2 diabetes mellitus with stage 5 chronic kidney disease (HCC)   Nicotine dependence   History of anemia due to chronic kidney disease   Hyperkalemia  #1.  Acute on chronic diastolic congestive heart failure. Chronic atrial fibrillation with rapid ventricular response. Patient has received IV Lasix, volume status seems to be better. I spoke with cardiology Dr. Ubaldo Glassing, he will see the patient today. Patient heart rate is better controlled today, will continue warfarin for anticoagulation.  2.  Chronic kidney disease stage V. Hyperkalemia. Patient is seen by nephrology.  We will give her dose of Lokelma.  3.  Interstitial lung disease. Bilateral pleural effusion. Patient CT scan showed interstitial changes, with bilateral pleural effusion.  I will check a procalcitonin level to rule out atypical pneumonia. Hold off antibiotics for now. Patient has some bronchospasm with wheezing, start prednisone 40 mg daily. Patient also has some cough, concern for aspiration, will obtain speech therapy evaluation. Most  likely pleural effusion secondary to congestive heart failure.  #4.  Type 2 diabetes with stage V chronic kidney disease. Continue sliding scale insulin for now.  5.  Iron deficient anemia. Patient still has significant iron deficiency, will give IV iron.  B12 level normal.     DVT prophylaxis: Coumadin Code Status: Full Family Communication:  Disposition Plan:  .   Status is: Inpatient  Remains inpatient appropriate because:Inpatient level of care appropriate due to severity of illness   Dispo: The patient is from: Home              Anticipated d/c is to: Home              Patient currently is not medically stable to d/c.   Difficult to place patient No        I/O last 3 completed shifts: In: -  Out: 550 [Urine:550] No intake/output data recorded.     Consultants:   Nephrology and cardiology.  Procedures: none  Antimicrobials: none Subjective: Patient currently on 2 L oxygen, short of breath had improved.  He still have a cough, with significant white mucus.  No chest pain or palpitation. No fever or chills. No dysuria hematuria. Abdominal pain or nausea vomiting.  No diarrhea or constipation.   Objective: Vitals:   02/07/21 0618 02/07/21 0700 02/07/21 0730 02/07/21 1030  BP: (!) 139/42 (!) 130/43 (!) 127/40 129/61  Pulse: 72 69 67 63  Resp: 20 (!) 25 (!) 24 18  Temp: 97.7 F (36.5 C)     TempSrc: Oral     SpO2: 98% 99% 97% 100%  Weight:      Height:        Intake/Output Summary (Last 24 hours)  at 02/07/2021 1359 Last data filed at 02/07/2021 0654 Gross per 24 hour  Intake --  Output 550 ml  Net -550 ml   Filed Weights   02/06/21 0434  Weight: 56.7 kg    Examination:  General exam: Appears calm and comfortable  Respiratory system: Decreased breathing sounds without crackles, mild wheezes. Respiratory effort normal. Cardiovascular system: Irregular. No JVD, murmurs, rubs, gallops or clicks. No pedal edema. Gastrointestinal system:  Abdomen is nondistended, soft and nontender. No organomegaly or masses felt. Normal bowel sounds heard. Central nervous system: Alert and oriented. No focal neurological deficits. Extremities: Symmetric 5 x 5 power. Skin: No rashes, lesions or ulcers Psychiatry:  Mood & affect appropriate.     Data Reviewed: I have personally reviewed following labs and imaging studies  CBC: Recent Labs  Lab 02/06/21 0503  WBC 8.6  NEUTROABS 6.0  HGB 9.0*  HCT 27.0*  MCV 83.9  PLT 542   Basic Metabolic Panel: Recent Labs  Lab 02/06/21 0503 02/06/21 1147 02/07/21 0651  NA 136  --  134*  K 5.4*  --  5.2*  CL 110  --  107  CO2 18*  --  17*  GLUCOSE 133*  --  144*  BUN 30*  --  43*  CREATININE 2.74*  --  3.19*  CALCIUM 8.7*  --  8.5*  PHOS  --  5.2*  --    GFR: Estimated Creatinine Clearance: 10.7 mL/min (A) (by C-G formula based on SCr of 3.19 mg/dL (H)). Liver Function Tests: No results for input(s): AST, ALT, ALKPHOS, BILITOT, PROT, ALBUMIN in the last 168 hours. No results for input(s): LIPASE, AMYLASE in the last 168 hours. No results for input(s): AMMONIA in the last 168 hours. Coagulation Profile: Recent Labs  Lab 02/06/21 1308  INR 1.6*   Cardiac Enzymes: No results for input(s): CKTOTAL, CKMB, CKMBINDEX, TROPONINI in the last 168 hours. BNP (last 3 results) No results for input(s): PROBNP in the last 8760 hours. HbA1C: No results for input(s): HGBA1C in the last 72 hours. CBG: Recent Labs  Lab 02/06/21 1144 02/06/21 1847 02/07/21 0846  GLUCAP 274* 158* 124*   Lipid Profile: No results for input(s): CHOL, HDL, LDLCALC, TRIG, CHOLHDL, LDLDIRECT in the last 72 hours. Thyroid Function Tests: No results for input(s): TSH, T4TOTAL, FREET4, T3FREE, THYROIDAB in the last 72 hours. Anemia Panel: Recent Labs    02/06/21 1147  VITAMINB12 744  FOLATE 16.8  FERRITIN 32  TIBC 280  IRON 19*   Sepsis Labs: No results for input(s): PROCALCITON, LATICACIDVEN in the  last 168 hours.  Recent Results (from the past 240 hour(s))  Resp Panel by RT-PCR (Flu A&B, Covid) Nasopharyngeal Swab     Status: None   Collection Time: 02/06/21  5:03 AM   Specimen: Nasopharyngeal Swab; Nasopharyngeal(NP) swabs in vial transport medium  Result Value Ref Range Status   SARS Coronavirus 2 by RT PCR NEGATIVE NEGATIVE Final    Comment: (NOTE) SARS-CoV-2 target nucleic acids are NOT DETECTED.  The SARS-CoV-2 RNA is generally detectable in upper respiratory specimens during the acute phase of infection. The lowest concentration of SARS-CoV-2 viral copies this assay can detect is 138 copies/mL. A negative result does not preclude SARS-Cov-2 infection and should not be used as the sole basis for treatment or other patient management decisions. A negative result may occur with  improper specimen collection/handling, submission of specimen other than nasopharyngeal swab, presence of viral mutation(s) within the areas targeted by this assay, and inadequate  number of viral copies(<138 copies/mL). A negative result must be combined with clinical observations, patient history, and epidemiological information. The expected result is Negative.  Fact Sheet for Patients:  EntrepreneurPulse.com.au  Fact Sheet for Healthcare Providers:  IncredibleEmployment.be  This test is no t yet approved or cleared by the [place] FDA and  has been authorized for detection and/or diagnosis of SARS-CoV-2 by FDA under an Emergency Use Authorization (EUA). This EUA will remain  in effect (meaning this test can be used) for the duration of the COVID-19 declaration under Section 564(b)(1) of the Act, 21 U.S.C.section 360bbb-3(b)(1), unless the authorization is terminated  or revoked sooner.       Influenza A by PCR NEGATIVE NEGATIVE Final   Influenza B by PCR NEGATIVE NEGATIVE Final    Comment: (NOTE) The Xpert Xpress SARS-CoV-2/FLU/RSV plus assay is  intended as an aid in the diagnosis of influenza from Nasopharyngeal swab specimens and should not be used as a sole basis for treatment. Nasal washings and aspirates are unacceptable for Xpert Xpress SARS-CoV-2/FLU/RSV testing.  Fact Sheet for Patients: EntrepreneurPulse.com.au  Fact Sheet for Healthcare Providers: IncredibleEmployment.be  This test is not yet approved or cleared by the [place] FDA and has been authorized for detection and/or diagnosis of SARS-CoV-2 by FDA under an Emergency Use Authorization (EUA). This EUA will remain in effect (meaning this test can be used) for the duration of the COVID-19 declaration under Section 564(b)(1) of the Act, 21 U.S.C. section 360bbb-3(b)(1), unless the authorization is terminated or revoked.  Performed at [facility], [place], [place] [address]   Respiratory (~20 pathogens) panel by PCR     Status: Abnormal   Collection Time: 02/06/21  9:57 AM   Specimen: Nasopharyngeal Swab; Respiratory  Result Value Ref Range Status   Adenovirus NOT DETECTED NOT DETECTED Final   Coronavirus 229E NOT DETECTED NOT DETECTED Final    Comment: (NOTE) The Coronavirus on the Respiratory Panel, DOES NOT test for the novel  Coronavirus (2019 nCoV)    Coronavirus HKU1 NOT DETECTED NOT DETECTED Final   Coronavirus NL63 NOT DETECTED NOT DETECTED Final   Coronavirus OC43 NOT DETECTED NOT DETECTED Final   Metapneumovirus NOT DETECTED NOT DETECTED Final   Rhinovirus / Enterovirus DETECTED (A) NOT DETECTED Final   Influenza A NOT DETECTED NOT DETECTED Final   Influenza B NOT DETECTED NOT DETECTED Final   Parainfluenza Virus 1 NOT DETECTED NOT DETECTED Final   Parainfluenza Virus 2 NOT DETECTED NOT DETECTED Final   Parainfluenza Virus 3 NOT DETECTED NOT DETECTED Final   Parainfluenza Virus 4 NOT DETECTED NOT DETECTED Final   Respiratory Syncytial Virus NOT DETECTED NOT DETECTED Final    Bordetella pertussis NOT DETECTED NOT DETECTED Final   Bordetella Parapertussis NOT DETECTED NOT DETECTED Final   Chlamydophila pneumoniae NOT DETECTED NOT DETECTED Final   Mycoplasma pneumoniae NOT DETECTED NOT DETECTED Final    Comment: Performed at [facility], [address], [place], [place] [address]         Radiology Studies: CT CHEST WO CONTRAST  Result Date: 02/06/2021 CLINICAL DATA:  Shortness of breath, congestion.  History of COPD. EXAM: CT CHEST WITHOUT CONTRAST TECHNIQUE: Multidetector CT imaging of the chest was performed following the standard protocol without IV contrast. COMPARISON:  Chest x-ray from earlier same day. Chest CT dated 01/03/2019. FINDINGS: Cardiovascular: Borderline cardiomegaly. Aortic atherosclerosis. No thoracic aortic aneurysm. No significant pericardial effusion. Coronary artery calcifications, particularly dense within the LEFT anterior  descending coronary artery. Mediastinum/Nodes: Moderately enlarged lymph nodes within the mediastinum, including a 1.3 cm short axis lymph node within the pre-vascular space and a 1.2 cm short axis lymph node within the precarinal space. These lymph nodes are not significantly changed compared to the previous study suggesting benign/reactive lymphadenopathy. No new enlarged or morphologically abnormal lymph nodes. Esophagus is unremarkable.  Trachea is unremarkable. Lungs/Pleura: New bilateral pleural effusions, moderate-sized, with adjacent compressive atelectasis. Additional areas of subpleural reticulation and ground-glass opacities within the upper lobes, most compatible with chronic interstitial lung disease/fibrosis. No evidence of a superimposed consolidating pneumonia. Upper Abdomen: Limited images of the upper abdomen are unremarkable. Musculoskeletal: Degenerative spondylosis of the kyphotic thoracic spine, mild to moderate in degree. No acute appearing osseous abnormality. Ill-defined fluid/edema within the  subcutaneous soft tissues of the lower chest and upper abdomen indicating some degree of anasarca. IMPRESSION: 1. New bilateral pleural effusions, moderate-sized, with adjacent compressive atelectasis. 2. Additional areas of subpleural reticulation and ground-glass opacities within the bilateral upper lobes, most compatible with NSIP, hypersensitivity pneumonitis and/or respiratory bronchiolitis. No evidence of a superimposed consolidating pneumonia. 3. Coronary artery calcifications, particularly dense within the LEFT anterior descending coronary artery. Recommend correlation with any possible associated cardiac symptoms. 4. Anasarca. Aortic Atherosclerosis (ICD10-I70.0). Electronically Signed   By: Franki Cabot M.D.   On: 02/06/2021 09:18   US RENAL  Result Date: 02/06/2021 CLINICAL DATA:  Acute kidney failure.  History of right nephrectomy. EXAM: RENAL / URINARY TRACT ULTRASOUND COMPLETE COMPARISON:  None. FINDINGS: Right Kidney: Surgically absent.  The renal fossa is unremarkable. Left Kidney: Renal measurements: 10.2 x 5.7 x 4.9 cm = volume: 150 mL. Echogenicity within normal limits. No mass or hydronephrosis visualized. Bladder: Appears normal for degree of bladder distention. Left jet visualized. Other: Incidentally noted right pleural effusion. IMPRESSION: 1.  Right kidney is surgically absent. 2.  Unremarkable sonographic appearance of the left kidney. 3.  Incidentally noted right pleural effusion. Electronically Signed   By: Audie Pinto M.D.   On: 02/06/2021 12:58   DG Chest Portable 1 View  Result Date: 02/06/2021 CLINICAL DATA:  Shortness of breath and nasal congestion with wheezing EXAM: PORTABLE CHEST 1 VIEW COMPARISON:  12/23/2020 FINDINGS: Generalized interstitial prominence. There is chronic lung disease by prior imaging. Borderline heart size with stable mediastinal contours. Blunting of the costophrenic sulci similar to prior. No pneumothorax. IMPRESSION: Interstitial opacity  correlating with chronic lung disease on prior imaging. Superimposed pulmonary edema or atypical infection would easily be obscured. Electronically Signed   By: Monte Fantasia M.D.   On: 02/06/2021 05:30        Scheduled Meds: . atorvastatin  10 mg Oral QPM  . citalopram  40 mg Oral Daily  . donepezil  10 mg Oral QHS  . enoxaparin (LOVENOX) injection  30 mg Subcutaneous Q24H  . ferrous sulfate  325 mg Oral Q breakfast  . fluticasone  1 spray Each Nare Daily  . insulin aspart  0-9 Units Subcutaneous TID WC  . nicotine  14 mg Transdermal Daily  . pantoprazole  40 mg Oral BID  . polyethylene glycol  17 g Oral Daily  . rOPINIRole  0.5 mg Oral QPM  . sodium chloride flush  3 mL Intravenous Q12H  . cyanocobalamin  1,000 mcg Oral Daily  . warfarin  2 mg Oral q1600  . Warfarin - Physician Dosing Inpatient   Does not apply q1600   Continuous Infusions: . sodium chloride    . sodium bicarbonate 150  mEq in D5W infusion 50 mL/hr at 02/07/21 1327     LOS: 1 day    Time spent: 34 minutes    Sharen Hones, MD [facility]   To contact the attending provider between 7A-7P or the covering provider during after hours 7P-7A, please log into the web site [email or web address] and access using universal [place] password for that web site. If you do not have the password, please call the hospital operator.  02/07/2021, 1:59 PM

## 2021-02-07 NOTE — Evaluation (Signed)
Physical Therapy Evaluation Patient Details Name: [name] MRN: [id number] DOB: 1940/12/20 Today's Date: 02/07/2021   History of Present Illness  Pt admitted for acute on chronic heart failure with complaints of SOB and nasal congestion. History includes DM, heart failure, CKD, Afib, HTN.  Clinical Impression  Pt is a pleasant 80 year old female who was admitted for acute on chronic heart failure. Pt performs bed mobility with min assist, transfers with cga, and ambulation with cga and RW. Further distance deferred due to incontinence. Pt on 2L at rest, no O2 at home. Mobility performed on RA with sats at 90%. Pt requesting to be returned to O2. 2L returned with sats at 93%. Pt demonstrates deficits with strength/endurance/mobility. Would benefit from skilled PT to address above deficits and promote optimal return to PLOF. Recommend transition to [place] upon discharge from acute hospitalization.     Follow Up Recommendations Home health PT;Supervision/Assistance - 24 hour    Equipment Recommendations  None recommended by PT    Recommendations for Other Services       Precautions / Restrictions Precautions Precautions: Fall Restrictions Weight Bearing Restrictions: No      Mobility  Bed Mobility Overal bed mobility: Needs Assistance Bed Mobility: Supine to Sit     Supine to sit: Min assist     General bed mobility comments: safe technique with slight assist for trunkal elevation    Transfers Overall transfer level: Needs assistance Equipment used: Rolling walker (2 wheeled) Transfers: Sit to/from Stand Sit to Stand: Min guard         General transfer comment: Feet unable to reach floor prior to standing. "jumped" off stretcher safely. Stood with RW  Ambulation/Gait Ambulation/Gait assistance: Counsellor (Feet): 5 Feet Assistive device: Rolling walker (2 wheeled) Gait Pattern/deviations: Step-to pattern     General Gait Details: limited due to  incontinence and multiple lines/leads. Step to gait pattern performed. Sat in chair while this Probation officer assisted with Technical brewer    Modified Rankin (Stroke Patients Only)       Balance Overall balance assessment: Needs assistance Sitting-balance support: Feet supported Sitting balance-Leahy Scale: Good     Standing balance support: Bilateral upper extremity supported Standing balance-Leahy Scale: Good                               Pertinent Vitals/Pain Pain Assessment: No/denies pain    Home Living Family/patient expects to be discharged to:: Private residence Living Arrangements: Children Available Help at Discharge: Family;Available 24 hours/day Type of Home: Mobile home Home Access: Ramped entrance     Home Layout: One level Home Equipment: [place] - single point;Walker - 2 wheels Additional Comments: reports she uses SPC vs RW depending on the day    Prior Function Level of Independence: Needs assistance         Comments: pt denies falls in past 12 mo     Hand Dominance        Extremity/Trunk Assessment   Upper Extremity Assessment Upper Extremity Assessment: Generalized weakness (B UE grossly 4/5)    Lower Extremity Assessment Lower Extremity Assessment: Generalized weakness (B LE grossly 4/5)       Communication   Communication: HOH  Cognition Arousal/Alertness: Awake/alert Behavior During Therapy: WFL for tasks assessed/performed Overall Cognitive Status: Within Functional Limits for tasks assessed  General Comments      Exercises Other Exercises Other Exercises: supine ther-ex performed on B LE including AP, SLRs, hip abd/add. 10 reps with cga. Other Exercises: Pt incontient needing whole bed change. Assisted with hygiene with pt needing min assist   Assessment/Plan    PT Assessment Patient needs continued PT services  PT Problem  List Decreased strength;Decreased activity tolerance;Decreased mobility;Cardiopulmonary status limiting activity       PT Treatment Interventions DME instruction;Gait training;Therapeutic activities;Therapeutic exercise    PT Goals (Current goals can be found in the Care Plan section)  Acute Rehab PT Goals Patient Stated Goal: to go home PT Goal Formulation: With patient Time For Goal Achievement: 02/21/21 Potential to Achieve Goals: Good    Frequency Min 2X/week   Barriers to discharge        Co-evaluation               AM-PAC PT "6 Clicks" Mobility  Outcome Measure Help needed turning from your back to your side while in a flat bed without using bedrails?: A Little Help needed moving from lying on your back to sitting on the side of a flat bed without using bedrails?: A Little Help needed moving to and from a bed to a chair (including a wheelchair)?: A Little Help needed standing up from a chair using your arms (e.g., wheelchair or bedside chair)?: A Little Help needed to walk in hospital room?: A Little Help needed climbing 3-5 steps with a railing? : A Little 6 Click Score: 18    End of Session Equipment Utilized During Treatment: Gait belt Activity Tolerance: Patient tolerated treatment well Patient left: in bed Nurse Communication: Mobility status PT Visit Diagnosis: Unsteadiness on feet (R26.81);Muscle weakness (generalized) (M62.81);Difficulty in walking, not elsewhere classified (R26.2)    Time: 0071-2197 PT Time Calculation (min) (ACUTE ONLY): 33 min   Charges:   PT Evaluation $PT Eval Low Complexity: 1 Low PT Treatments $Therapeutic Exercise: 8-22 mins $Therapeutic Activity: 8-22 mins        Greggory Stallion, PT, DPT [phone]   Shiron Whetsel 02/07/2021, 4:43 PM

## 2021-02-07 NOTE — ED Notes (Addendum)
Report received from [facility] RN. Pt resting on stretcher in NAD. On 2L [place], bicarb infusing. 1048mL UOP in suction canister from purewick. Pt denies pain. Denies SOB. Warfarin not given earlier, med not in ED. Will dose per [facility] once med comes from pharmacy. Pt AAOx4

## 2021-02-07 NOTE — Progress Notes (Signed)
Notified by RN that she needed a missing dose of coumadin. After investigating, it was documented that patient did not get a dose of coumadin on 4/17 and a dose was given at 0400 this am. The MAR indicated a dose was again due at 1000. Changed the dose to 1600 daily as this is our standard time for Coumadin dosing.

## 2021-02-07 NOTE — Progress Notes (Signed)
Pt arrived to unit by stretcher, transported by ED RN.  Pt is A&O, however she cannot answer some of the admission questions.    Pt has difficulty seeing and is hard of hearing.  Both are uncorrected.  Pt reports pain in "left lung".    Oriented to room and unit.  Call bell next to patient in bed.  Bed alarm on.

## 2021-02-07 NOTE — ED Notes (Signed)
meds given.  Family with pt.  

## 2021-02-07 NOTE — Progress Notes (Signed)
[facility]  ROUNDING NOTE   Subjective:   UOP 570mL recorded  States her breathing is somewhat better.   Objective:  Vital signs in last 24 hours:  Temp:  [97.7 F (36.5 C)] 97.7 F (36.5 C) (04/18 0618) Pulse Rate:  [63-86] 63 (04/18 1030) Resp:  [16-26] 18 (04/18 1030) BP: (98-151)/(38-120) 129/61 (04/18 1030) SpO2:  [89 %-100 %] 100 % (04/18 1030)  Weight change:  Filed Weights   02/06/21 0434  Weight: 56.7 kg    Intake/Output: I/O last 3 completed shifts: In: -  Out: 550 [Urine:550]   Intake/Output this shift:  No intake/output data recorded.  Physical Exam: General: NAD, laying in bed  Head: Normocephalic, atraumatic. Moist oral mucosal membranes  Eyes: Anicteric, PERRL  Neck: Supple, trachea midline  Lungs:  +bilateral wheezing  Heart: Regular rate and rhythm  Abdomen:  Soft, nontender,   Extremities:  no peripheral edema.  Neurologic: Nonfocal, moving all four extremities  Skin: No lesions         Basic Metabolic Panel: Recent Labs  Lab 02/06/21 0503 02/06/21 1147 02/07/21 0651  NA 136  --  134*  K 5.4*  --  5.2*  CL 110  --  107  CO2 18*  --  17*  GLUCOSE 133*  --  144*  BUN 30*  --  43*  CREATININE 2.74*  --  3.19*  CALCIUM 8.7*  --  8.5*  PHOS  --  5.2*  --     Liver Function Tests: No results for input(s): AST, ALT, ALKPHOS, BILITOT, PROT, ALBUMIN in the last 168 hours. No results for input(s): LIPASE, AMYLASE in the last 168 hours. No results for input(s): AMMONIA in the last 168 hours.  CBC: Recent Labs  Lab 02/06/21 0503  WBC 8.6  NEUTROABS 6.0  HGB 9.0*  HCT 27.0*  MCV 83.9  PLT 237    Cardiac Enzymes: No results for input(s): CKTOTAL, CKMB, CKMBINDEX, TROPONINI in the last 168 hours.  BNP: Invalid input(s): POCBNP  CBG: Recent Labs  Lab 02/06/21 1144 02/06/21 1847 02/07/21 0846  GLUCAP 274* 158* 124*    Microbiology: Results for orders placed or performed during the hospital encounter of  02/06/21  Resp Panel by RT-PCR (Flu A&B, Covid) Nasopharyngeal Swab     Status: None   Collection Time: 02/06/21  5:03 AM   Specimen: Nasopharyngeal Swab; Nasopharyngeal(NP) swabs in vial transport medium  Result Value Ref Range Status   SARS Coronavirus 2 by RT PCR NEGATIVE NEGATIVE Final    Comment: (NOTE) SARS-CoV-2 target nucleic acids are NOT DETECTED.  The SARS-CoV-2 RNA is generally detectable in upper respiratory specimens during the acute phase of infection. The lowest concentration of SARS-CoV-2 viral copies this assay can detect is 138 copies/mL. A negative result does not preclude SARS-Cov-2 infection and should not be used as the sole basis for treatment or other patient management decisions. A negative result may occur with  improper specimen collection/handling, submission of specimen other than nasopharyngeal swab, presence of viral mutation(s) within the areas targeted by this assay, and inadequate number of viral copies(<138 copies/mL). A negative result must be combined with clinical observations, patient history, and epidemiological information. The expected result is Negative.  Fact Sheet for Patients:  EntrepreneurPulse.com.au  Fact Sheet for Healthcare Providers:  IncredibleEmployment.be  This test is no t yet approved or cleared by the [place] FDA and  has been authorized for detection and/or diagnosis of SARS-CoV-2 by FDA under an Emergency Use  Authorization (EUA). This EUA will remain  in effect (meaning this test can be used) for the duration of the COVID-19 declaration under Section 564(b)(1) of the Act, 21 U.S.C.section 360bbb-3(b)(1), unless the authorization is terminated  or revoked sooner.       Influenza A by PCR NEGATIVE NEGATIVE Final   Influenza B by PCR NEGATIVE NEGATIVE Final    Comment: (NOTE) The Xpert Xpress SARS-CoV-2/FLU/RSV plus assay is intended as an aid in the diagnosis of influenza from  Nasopharyngeal swab specimens and should not be used as a sole basis for treatment. Nasal washings and aspirates are unacceptable for Xpert Xpress SARS-CoV-2/FLU/RSV testing.  Fact Sheet for Patients: EntrepreneurPulse.com.au  Fact Sheet for Healthcare Providers: IncredibleEmployment.be  This test is not yet approved or cleared by the [place] FDA and has been authorized for detection and/or diagnosis of SARS-CoV-2 by FDA under an Emergency Use Authorization (EUA). This EUA will remain in effect (meaning this test can be used) for the duration of the COVID-19 declaration under Section 564(b)(1) of the Act, 21 U.S.C. section 360bbb-3(b)(1), unless the authorization is terminated or revoked.  Performed at [facility], [place], [place] [address]   Respiratory (~20 pathogens) panel by PCR     Status: Abnormal   Collection Time: 02/06/21  9:57 AM   Specimen: Nasopharyngeal Swab; Respiratory  Result Value Ref Range Status   Adenovirus NOT DETECTED NOT DETECTED Final   Coronavirus 229E NOT DETECTED NOT DETECTED Final    Comment: (NOTE) The Coronavirus on the Respiratory Panel, DOES NOT test for the novel  Coronavirus (2019 nCoV)    Coronavirus HKU1 NOT DETECTED NOT DETECTED Final   Coronavirus NL63 NOT DETECTED NOT DETECTED Final   Coronavirus OC43 NOT DETECTED NOT DETECTED Final   Metapneumovirus NOT DETECTED NOT DETECTED Final   Rhinovirus / Enterovirus DETECTED (A) NOT DETECTED Final   Influenza A NOT DETECTED NOT DETECTED Final   Influenza B NOT DETECTED NOT DETECTED Final   Parainfluenza Virus 1 NOT DETECTED NOT DETECTED Final   Parainfluenza Virus 2 NOT DETECTED NOT DETECTED Final   Parainfluenza Virus 3 NOT DETECTED NOT DETECTED Final   Parainfluenza Virus 4 NOT DETECTED NOT DETECTED Final   Respiratory Syncytial Virus NOT DETECTED NOT DETECTED Final   Bordetella pertussis NOT DETECTED NOT DETECTED Final    Bordetella Parapertussis NOT DETECTED NOT DETECTED Final   Chlamydophila pneumoniae NOT DETECTED NOT DETECTED Final   Mycoplasma pneumoniae NOT DETECTED NOT DETECTED Final    Comment: Performed at [facility], [address] [place],  [address]    Coagulation Studies: Recent Labs    02/06/21 1308  LABPROT 18.7*  INR 1.6*    Urinalysis: No results for input(s): COLORURINE, LABSPEC, PHURINE, GLUCOSEU, HGBUR, BILIRUBINUR, KETONESUR, PROTEINUR, UROBILINOGEN, NITRITE, LEUKOCYTESUR in the last 72 hours.  Invalid input(s): APPERANCEUR    Imaging: CT CHEST WO CONTRAST  Result Date: 02/06/2021 CLINICAL DATA:  Shortness of breath, congestion.  History of COPD. EXAM: CT CHEST WITHOUT CONTRAST TECHNIQUE: Multidetector CT imaging of the chest was performed following the standard protocol without IV contrast. COMPARISON:  Chest x-ray from earlier same day. Chest CT dated 01/03/2019. FINDINGS: Cardiovascular: Borderline cardiomegaly. Aortic atherosclerosis. No thoracic aortic aneurysm. No significant pericardial effusion. Coronary artery calcifications, particularly dense within the LEFT anterior descending coronary artery. Mediastinum/Nodes: Moderately enlarged lymph nodes within the mediastinum, including a 1.3 cm short axis lymph node within the pre-vascular space and a 1.2 cm short axis lymph node  within the precarinal space. These lymph nodes are not significantly changed compared to the previous study suggesting benign/reactive lymphadenopathy. No new enlarged or morphologically abnormal lymph nodes. Esophagus is unremarkable.  Trachea is unremarkable. Lungs/Pleura: New bilateral pleural effusions, moderate-sized, with adjacent compressive atelectasis. Additional areas of subpleural reticulation and ground-glass opacities within the upper lobes, most compatible with chronic interstitial lung disease/fibrosis. No evidence of a superimposed consolidating pneumonia. Upper Abdomen: Limited  images of the upper abdomen are unremarkable. Musculoskeletal: Degenerative spondylosis of the kyphotic thoracic spine, mild to moderate in degree. No acute appearing osseous abnormality. Ill-defined fluid/edema within the subcutaneous soft tissues of the lower chest and upper abdomen indicating some degree of anasarca. IMPRESSION: 1. New bilateral pleural effusions, moderate-sized, with adjacent compressive atelectasis. 2. Additional areas of subpleural reticulation and ground-glass opacities within the bilateral upper lobes, most compatible with NSIP, hypersensitivity pneumonitis and/or respiratory bronchiolitis. No evidence of a superimposed consolidating pneumonia. 3. Coronary artery calcifications, particularly dense within the LEFT anterior descending coronary artery. Recommend correlation with any possible associated cardiac symptoms. 4. Anasarca. Aortic Atherosclerosis (ICD10-I70.0). Electronically Signed   By: Franki Cabot M.D.   On: 02/06/2021 09:18   US RENAL  Result Date: 02/06/2021 CLINICAL DATA:  Acute kidney failure.  History of right nephrectomy. EXAM: RENAL / URINARY TRACT ULTRASOUND COMPLETE COMPARISON:  None. FINDINGS: Right Kidney: Surgically absent.  The renal fossa is unremarkable. Left Kidney: Renal measurements: 10.2 x 5.7 x 4.9 cm = volume: 150 mL. Echogenicity within normal limits. No mass or hydronephrosis visualized. Bladder: Appears normal for degree of bladder distention. Left jet visualized. Other: Incidentally noted right pleural effusion. IMPRESSION: 1.  Right kidney is surgically absent. 2.  Unremarkable sonographic appearance of the left kidney. 3.  Incidentally noted right pleural effusion. Electronically Signed   By: Audie Pinto M.D.   On: 02/06/2021 12:58   DG Chest Portable 1 View  Result Date: 02/06/2021 CLINICAL DATA:  Shortness of breath and nasal congestion with wheezing EXAM: PORTABLE CHEST 1 VIEW COMPARISON:  12/23/2020 FINDINGS: Generalized interstitial  prominence. There is chronic lung disease by prior imaging. Borderline heart size with stable mediastinal contours. Blunting of the costophrenic sulci similar to prior. No pneumothorax. IMPRESSION: Interstitial opacity correlating with chronic lung disease on prior imaging. Superimposed pulmonary edema or atypical infection would easily be obscured. Electronically Signed   By: Monte Fantasia M.D.   On: 02/06/2021 05:30     Medications:   . sodium chloride    . sodium bicarbonate 150 mEq in D5W infusion     . atorvastatin  10 mg Oral QPM  . citalopram  40 mg Oral Daily  . donepezil  10 mg Oral QHS  . enoxaparin (LOVENOX) injection  30 mg Subcutaneous Q24H  . ferrous sulfate  325 mg Oral Q breakfast  . fluticasone  1 spray Each Nare Daily  . furosemide  60 mg Intravenous Q12H  . insulin aspart  0-9 Units Subcutaneous TID WC  . lisinopril  20 mg Oral Daily  . nicotine  14 mg Transdermal Daily  . pantoprazole  40 mg Oral BID  . polyethylene glycol  17 g Oral Daily  . rOPINIRole  0.5 mg Oral QPM  . sodium chloride flush  3 mL Intravenous Q12H  . cyanocobalamin  1,000 mcg Oral Daily  . warfarin  2 mg Oral q1600  . Warfarin - Physician Dosing Inpatient   Does not apply q1600   sodium chloride, acetaminophen, bisacodyl, ipratropium-albuterol, ondansetron (ZOFRAN) IV, sodium chloride flush  Assessment/ Plan:  [name] is a 80 y.o. white female with right nephrectomy, diastolic congestive heart failure hypertension, hyperlipidemia, GERD, insulin dependent diabetes mellitus type II, atrial fibrillation, anemia, depression, dementia who was admitted to [facility] on 02/06/2021 for Acute CHF (congestive heart failure) ([place]) [I50.9]  1. Acute kidney injury with hyperkalemia, hyponatremia and metabolic acidosis on chronic kidney disease stage IV with proteinuria: Baseline creatinine of  2.63, GFR of 18 on 01/20/2021.  Chronic kidney disease secondary to solitary kidney, diabetes and  hypertension.  No recent IV contrast exposure. No obstruction on ultrasound.  - Hold lisinopril   - Start IV fluids: sodium bicarbonate infusion at 42mL/hr - SPEP/UPEP pending.   2. Hypertension and acute exacerbation of chronic diastolic congestive heart failure:  - hold diuretics.   3. Diabetes mellitus type II with chronic kidney disease: insulin dependent. Hemoglobin A1c of 6.9% on 12/24/20.   4. Anemia of chronic kidney disease: normocytic. Hemoglobin 9. Consistent with iron deficiency.    LOS: 1 Hilarie Sinha 4/18/202212:24 PM

## 2021-02-07 NOTE — ED Notes (Signed)
Resumed care from [facility] in [place].  Pt alert  Iv meds infusing.  purewick in place.

## 2021-02-07 NOTE — Consult Note (Signed)
  Cardiology Consultation Note    Patient ID: [name], MRN: [id number], DOB/AGE: 03/07/1941 80 y.o. Admit date: 02/06/2021   Date of Consult: 02/07/2021 Primary Physician: Khan, Fozia M, MD Primary Cardiologist: None  Chief Complaint: sob Reason for Consultation: chf Requesting MD: Dr. Zhang  HPI: [name] is a 80 y.o. female with history of chronic diastolic heart failure, diabetes, atrial fibrillation, tobacco abuse, hypertension who presented to emergency room yesterday morning with complaints of shortness of breath.  She had no burning fever or chills but had orthopnea.  Pulse ox on room air was 90 to 91%.  Laboratories showed hemoglobin of 9.0 BNP of 233.7 with a normal troponin of 12 and 11.  Chest x-ray showed interstitial opacity consistent with chronic lung disease.  Was felt to that the pulmonary edema would be obscured.  Chest CT revealed small to medium mild lateral pleural effusions.  EKG showed atrial fibrillation with right axis deviation.  Patient was given Lasix in the emergency room.  As an outpatient she was on warfarin, amlodipine, atorvastatin, digoxin 0.125 mg daily, hydralazine twice daily, lisinopril 20 daily.  Most recent echocardiogram done on 1 month ago showed preserved LV function with an EF of 60 to 65% with mild aortic stenosis, left pleural effusion.  Urgent cardiac consult ordered today via secure messaging for A. fib with RVR.  Telemetry reveals atrial fibrillation with controlled ventricular response.  EKG done yesterday showed atrial fibrillation with a rate of 65.  Patient is currently not on any rate related medications.  Past Medical History:  Diagnosis Date  . Anemia   . Atrial fibrillation (HCC)   . Congestive heart failure (CHF) (HCC)   . Diabetes mellitus without complication (HCC)   . GERD (gastroesophageal reflux disease)   . Hyperlipidemia   . Hypertension       Surgical History:  Past Surgical History:  Procedure Laterality Date   . ABDOMINAL HYSTERECTOMY    . APPENDECTOMY    . CATARACT EXTRACTION    . CHOLECYSTECTOMY    . HERNIA REPAIR    . right knee replacement    . right nephroectomy       Home Meds: Prior to Admission medications   Medication Sig Start Date End Date Taking? Authorizing Provider  amLODipine (NORVASC) 10 MG tablet Take 1 tablet (10 mg total) by mouth daily. 01/13/21  Yes Khan, Fozia M, MD  atorvastatin (LIPITOR) 10 MG tablet Take 1 tablet (10 mg total) by mouth every evening. 12/06/20  Yes McDonough, Lauren K, PA-C  bisacodyl (DULCOLAX) 5 MG EC tablet Take 1 tablet (5 mg total) by mouth daily as needed for moderate constipation. 12/29/20  Yes Kumar, Dileep, MD  Blood Pressure Monitor KIT Use three times daily to check blood pressure  DX:  I50.42 01/13/21  Yes Khan, Fozia M, MD  citalopram (CELEXA) 40 MG tablet Take 1 tablet (40 mg total) by mouth daily. 10/20/20  Yes Khan, Fozia M, MD  digoxin (LANOXIN) 0.125 MG tablet Take 1 tablet (0.125 mg total) by mouth daily. 12/06/20  Yes McDonough, Lauren K, PA-C  donepezil (ARICEPT) 5 MG tablet TAKE 1 TABLET BY MOUTH ONCE DAILY FOR MEMORY Patient taking differently: Take 5 mg by mouth daily. TAKE 1 TABLET BY MOUTH ONCE DAILY FOR MEMORY 12/06/20  Yes McDonough, Lauren K, PA-C  ferrous sulfate 325 (65 FE) MG tablet Take 1 tablet (325 mg total) by mouth daily with breakfast. 12/30/20  Yes Kumar, Dileep, MD  glimepiride (AMARYL)   2 MG tablet Take 2 mg by mouth daily. With the biggest meal of the day   Yes [provider]  hydrALAZINE (APRESOLINE) 50 MG tablet Take 1 tablet by mouth twice a day, may take an extra 50 mg if Bp is elevated.  Hold if SBP less than 130 mmHg Patient taking differently: Take 50 mg by mouth See admin instructions. Take 1 tablet (90m) by mouth twice daily -- may take 1 additional tablet (587m by mouth daily if needed for elevated blood pressure 01/13/21  Yes KhLavera GuiseMD  lisinopril (ZESTRIL) 20 MG tablet Take 1 tablet (20 mg  total) by mouth daily. 01/13/21  Yes KhLavera GuiseMD  melatonin 3 MG TABS tablet Take 3 mg by mouth at bedtime as needed (sleep).   Yes [provider]  pantoprazole (PROTONIX) 40 MG tablet Take 1 tablet (40 mg total) by mouth 2 (two) times daily. 10/20/20  Yes KhLavera GuiseMD  promethazine (PHENERGAN) 12.5 MG tablet Take 1 tablet (12.5 mg total) by mouth every 8 (eight) hours as needed for nausea or vomiting. 01/24/21  Yes McDonough, Lauren K, PA-C  rOPINIRole (REQUIP) 0.5 MG tablet Take 1 tablet (0.5 mg total) by mouth every evening. 10/20/20  Yes KhLavera GuiseMD  vitamin B-12 1000 MCG tablet Take 1 tablet (1,000 mcg total) by mouth daily. 12/30/20  Yes KuVal RilesMD  warfarin (COUMADIN) 2 MG tablet Take 1 tablet (2 mg total) by mouth daily. 12/06/20  Yes McDonough, Lauren K, PA-C  fluticasone (FLONASE) 50 MCG/ACT nasal spray Place 1 spray into both nostrils daily. 02/03/21   McDonough, LaSi GaulPA-C  insulin glargine, 2 Unit Dial, (TOUJEO MAX SOLOSTAR) 300 UNIT/ML Solostar Pen Take 6 to 10 units with supper( needs needles as well e 11.65 12/20/20   KhLavera GuiseMD  polyethylene glycol (MIRALAX) 17 g packet Take 17 g by mouth daily. 12/29/20   KuVal RilesMD    Inpatient Medications:  . atorvastatin  10 mg Oral QPM  . citalopram  40 mg Oral Daily  . donepezil  10 mg Oral QHS  . enoxaparin (LOVENOX) injection  30 mg Subcutaneous Q24H  . ferrous sulfate  325 mg Oral Q breakfast  . fluticasone  1 spray Each Nare Daily  . furosemide  60 mg Intravenous Q12H  . insulin aspart  0-9 Units Subcutaneous TID WC  . lisinopril  20 mg Oral Daily  . nicotine  14 mg Transdermal Daily  . pantoprazole  40 mg Oral BID  . polyethylene glycol  17 g Oral Daily  . rOPINIRole  0.5 mg Oral QPM  . sodium chloride flush  3 mL Intravenous Q12H  . cyanocobalamin  1,000 mcg Oral Daily  . warfarin  2 mg Oral q1600  . Warfarin - Physician Dosing Inpatient   Does not apply q1600   . sodium chloride     . sodium bicarbonate 150 mEq in D5W infusion      Allergies: No Known Allergies  Social History   Socioeconomic History  . Marital status: Widowed    Spouse name: Not on file  . Number of children: Not on file  . Years of education: Not on file  . Highest education level: Not on file  Occupational History  . Not on file  Tobacco Use  . Smoking status: Current Every Day Smoker    Packs/day: 1.00    Types: Cigarettes    Last attempt to quit: 01/03/2019  Years since quitting: 2.0  . Smokeless tobacco: Never Used  Vaping Use  . Vaping Use: Never used  Substance and Sexual Activity  . Alcohol use: No  . Drug use: No  . Sexual activity: Not on file  Other Topics Concern  . Not on file  Social History Narrative  . Not on file   Social Determinants of Health   Financial Resource Strain: Not on file  Food Insecurity: Not on file  Transportation Needs: Not on file  Physical Activity: Not on file  Stress: Not on file  Social Connections: Not on file  Intimate Partner Violence: Not on file     Family History  Problem Relation Age of Onset  . Breast cancer Mother      Review of Systems: A 12-system review of systems was performed and is negative except as noted in the HPI.  Labs: No results for input(s): CKTOTAL, CKMB, TROPONINI in the last 72 hours. Lab Results  Component Value Date   WBC 8.6 02/06/2021   HGB 9.0 (L) 02/06/2021   HCT 27.0 (L) 02/06/2021   MCV 83.9 02/06/2021   PLT 237 02/06/2021    Recent Labs  Lab 02/07/21 0651  NA 134*  K 5.2*  CL 107  CO2 17*  BUN 43*  CREATININE 3.19*  CALCIUM 8.5*  GLUCOSE 144*   Lab Results  Component Value Date   CHOL 97 12/24/2020   HDL 22 (L) 12/24/2020   LDLCALC 44 12/24/2020   TRIG 153 (H) 12/24/2020   No results found for: DDIMER  Radiology/Studies:  CT CHEST WO CONTRAST  Result Date: 02/06/2021 CLINICAL DATA:  Shortness of breath, congestion.  History of COPD. EXAM: CT CHEST WITHOUT CONTRAST  TECHNIQUE: Multidetector CT imaging of the chest was performed following the standard protocol without IV contrast. COMPARISON:  Chest x-ray from earlier same day. Chest CT dated 01/03/2019. FINDINGS: Cardiovascular: Borderline cardiomegaly. Aortic atherosclerosis. No thoracic aortic aneurysm. No significant pericardial effusion. Coronary artery calcifications, particularly dense within the LEFT anterior descending coronary artery. Mediastinum/Nodes: Moderately enlarged lymph nodes within the mediastinum, including a 1.3 cm short axis lymph node within the pre-vascular space and a 1.2 cm short axis lymph node within the precarinal space. These lymph nodes are not significantly changed compared to the previous study suggesting benign/reactive lymphadenopathy. No new enlarged or morphologically abnormal lymph nodes. Esophagus is unremarkable.  Trachea is unremarkable. Lungs/Pleura: New bilateral pleural effusions, moderate-sized, with adjacent compressive atelectasis. Additional areas of subpleural reticulation and ground-glass opacities within the upper lobes, most compatible with chronic interstitial lung disease/fibrosis. No evidence of a superimposed consolidating pneumonia. Upper Abdomen: Limited images of the upper abdomen are unremarkable. Musculoskeletal: Degenerative spondylosis of the kyphotic thoracic spine, mild to moderate in degree. No acute appearing osseous abnormality. Ill-defined fluid/edema within the subcutaneous soft tissues of the lower chest and upper abdomen indicating some degree of anasarca. IMPRESSION: 1. New bilateral pleural effusions, moderate-sized, with adjacent compressive atelectasis. 2. Additional areas of subpleural reticulation and ground-glass opacities within the bilateral upper lobes, most compatible with NSIP, hypersensitivity pneumonitis and/or respiratory bronchiolitis. No evidence of a superimposed consolidating pneumonia. 3. Coronary artery calcifications, particularly  dense within the LEFT anterior descending coronary artery. Recommend correlation with any possible associated cardiac symptoms. 4. Anasarca. Aortic Atherosclerosis (ICD10-I70.0). Electronically Signed   By: Stan  Maynard M.D.   On: 02/06/2021 09:18   US RENAL  Result Date: 02/06/2021 CLINICAL DATA:  Acute kidney failure.  History of right nephrectomy. EXAM: RENAL / URINARY TRACT   ULTRASOUND COMPLETE COMPARISON:  None. FINDINGS: Right Kidney: Surgically absent.  The renal fossa is unremarkable. Left Kidney: Renal measurements: 10.2 x 5.7 x 4.9 cm = volume: 150 mL. Echogenicity within normal limits. No mass or hydronephrosis visualized. Bladder: Appears normal for degree of bladder distention. Left jet visualized. Other: Incidentally noted right pleural effusion. IMPRESSION: 1.  Right kidney is surgically absent. 2.  Unremarkable sonographic appearance of the left kidney. 3.  Incidentally noted right pleural effusion. Electronically Signed   By: Audie Pinto M.D.   On: 02/06/2021 12:58   DG Chest Portable 1 View  Result Date: 02/06/2021 CLINICAL DATA:  Shortness of breath and nasal congestion with wheezing EXAM: PORTABLE CHEST 1 VIEW COMPARISON:  12/23/2020 FINDINGS: Generalized interstitial prominence. There is chronic lung disease by prior imaging. Borderline heart size with stable mediastinal contours. Blunting of the costophrenic sulci similar to prior. No pneumothorax. IMPRESSION: Interstitial opacity correlating with chronic lung disease on prior imaging. Superimposed pulmonary edema or atypical infection would easily be obscured. Electronically Signed   By: Monte Fantasia M.D.   On: 02/06/2021 05:30    Wt Readings from Last 3 Encounters:  02/06/21 56.7 kg  01/24/21 57.5 kg  12/29/20 48.5 kg    EKG: Atrial fibrillation with controlled ventricular response  Physical Exam:  Blood pressure (!) 127/40, pulse 67, temperature 97.7 F (36.5 C), temperature source Oral, resp. rate (!) 24,  height 4' 10" (1.473 m), weight 56.7 kg, SpO2 97 %. Body mass index is 26.13 kg/m. General: Well developed, well nourished, in no acute distress. Head: Normocephalic, atraumatic, sclera non-icteric, no xanthomas, nares are without discharge.  Neck: Negative for carotid bruits. JVD not elevated. Lungs: Clear bilaterally to auscultation without wheezes, rales, or rhonchi. Breathing is unlabored. Heart: Irregular irregular Abdomen: Soft, non-tender, non-distended with normoactive bowel sounds. No hepatomegaly. No rebound/guarding. No obvious abdominal masses. Msk:  Strength and tone appear normal for age. Extremities: No clubbing or cyanosis. No edema.  Distal pedal pulses are 2+ and equal bilaterally. Neuro: Alert and oriented X 3. No facial asymmetry. No focal deficit. Moves all extremities spontaneously. Psych:  Responds to questions appropriately with a normal affect.     Assessment and Plan  80 yo female who had diastolic chf who presented with increasing sob. CXR suggested volume overload. EKG showed afib with controlled vr.   CHF-echo read last month showed preserved lv funciton. Will continue with careful diuresis and follow clinical response, hemodynamics and electrolytes.   afib-rate is well controlled. Telemetry has artifact but no sustained rvr. Continue to follow. Is on warfarin. INR goal of 2-3.   Signed, Teodoro Spray MD 02/07/2021, 10:30 AM Pager: [phone]

## 2021-02-07 NOTE — ED Notes (Signed)
fsbs 108   Pt alert

## 2021-02-07 NOTE — ED Notes (Signed)
Audible wheezing heard, pt coughing. She denies SOB. Will give breathing tx per [facility]

## 2021-02-08 ENCOUNTER — Other Ambulatory Visit: Payer: Self-pay

## 2021-02-08 ENCOUNTER — Encounter: Payer: Self-pay | Admitting: Internal Medicine

## 2021-02-08 DIAGNOSIS — D631 Anemia in chronic kidney disease: Secondary | ICD-10-CM

## 2021-02-08 DIAGNOSIS — N178 Other acute kidney failure: Secondary | ICD-10-CM

## 2021-02-08 DIAGNOSIS — N184 Chronic kidney disease, stage 4 (severe): Secondary | ICD-10-CM

## 2021-02-08 DIAGNOSIS — N189 Chronic kidney disease, unspecified: Secondary | ICD-10-CM

## 2021-02-08 LAB — CBC WITH DIFFERENTIAL/PLATELET
Abs Immature Granulocytes: 0.12 10*3/uL — ABNORMAL HIGH (ref 0.00–0.07)
Basophils Absolute: 0 10*3/uL (ref 0.0–0.1)
Basophils Relative: 0 %
Eosinophils Absolute: 0 10*3/uL (ref 0.0–0.5)
Eosinophils Relative: 0 %
HCT: 22.7 % — ABNORMAL LOW (ref 36.0–46.0)
Hemoglobin: 7.7 g/dL — ABNORMAL LOW (ref 12.0–15.0)
Immature Granulocytes: 1 %
Lymphocytes Relative: 9 %
Lymphs Abs: 1 10*3/uL (ref 0.7–4.0)
MCH: 27.7 pg (ref 26.0–34.0)
MCHC: 33.9 g/dL (ref 30.0–36.0)
MCV: 81.7 fL (ref 80.0–100.0)
Monocytes Absolute: 0.3 10*3/uL (ref 0.1–1.0)
Monocytes Relative: 3 %
Neutro Abs: 10.7 10*3/uL — ABNORMAL HIGH (ref 1.7–7.7)
Neutrophils Relative %: 87 %
Platelets: 228 10*3/uL (ref 150–400)
RBC: 2.78 MIL/uL — ABNORMAL LOW (ref 3.87–5.11)
RDW: 22.7 % — ABNORMAL HIGH (ref 11.5–15.5)
WBC: 12.2 10*3/uL — ABNORMAL HIGH (ref 4.0–10.5)
nRBC: 0 % (ref 0.0–0.2)

## 2021-02-08 LAB — PROTEIN ELECTROPHORESIS, SERUM
A/G Ratio: 0.7 (ref 0.7–1.7)
Albumin ELP: 2.9 g/dL (ref 2.9–4.4)
Alpha-1-Globulin: 0.3 g/dL (ref 0.0–0.4)
Alpha-2-Globulin: 0.5 g/dL (ref 0.4–1.0)
Beta Globulin: 1.6 g/dL — ABNORMAL HIGH (ref 0.7–1.3)
Gamma Globulin: 1.7 g/dL (ref 0.4–1.8)
Globulin, Total: 4.1 g/dL — ABNORMAL HIGH (ref 2.2–3.9)
Total Protein ELP: 7 g/dL (ref 6.0–8.5)

## 2021-02-08 LAB — BASIC METABOLIC PANEL
Anion gap: 8 (ref 5–15)
BUN: 50 mg/dL — ABNORMAL HIGH (ref 8–23)
CO2: 23 mmol/L (ref 22–32)
Calcium: 8 mg/dL — ABNORMAL LOW (ref 8.9–10.3)
Chloride: 104 mmol/L (ref 98–111)
Creatinine, Ser: 3.1 mg/dL — ABNORMAL HIGH (ref 0.44–1.00)
GFR, Estimated: 15 mL/min — ABNORMAL LOW (ref >60)
Glucose, Bld: 176 mg/dL — ABNORMAL HIGH (ref 70–99)
Potassium: 4.4 mmol/L (ref 3.5–5.1)
Sodium: 135 mmol/L (ref 135–145)

## 2021-02-08 LAB — PROCALCITONIN: Procalcitonin: 0.1 ng/mL

## 2021-02-08 LAB — MAGNESIUM: Magnesium: 2.2 mg/dL (ref 1.7–2.4)

## 2021-02-08 LAB — GLUCOSE, CAPILLARY
Glucose-Capillary: 139 mg/dL — ABNORMAL HIGH (ref 70–99)
Glucose-Capillary: 166 mg/dL — ABNORMAL HIGH (ref 70–99)
Glucose-Capillary: 207 mg/dL — ABNORMAL HIGH (ref 70–99)

## 2021-02-08 LAB — PROTEIN ELECTRO, RANDOM URINE
Albumin ELP, Urine: 62.8 %
Alpha-1-Globulin, U: 1 %
Alpha-2-Globulin, U: 3.3 %
Beta Globulin, U: 11 %
Gamma Globulin, U: 22 %
Total Protein, Urine: 164.8 mg/dL

## 2021-02-08 LAB — PROTIME-INR
INR: 1.7 — ABNORMAL HIGH (ref 0.8–1.2)
Prothrombin Time: 19.7 seconds — ABNORMAL HIGH (ref 11.4–15.2)

## 2021-02-08 LAB — PARATHYROID HORMONE, INTACT (NO CA): PTH: 40 pg/mL (ref 15–65)

## 2021-02-08 MED ORDER — ENSURE ENLIVE PO LIQD
237.0000 mL | Freq: Two times a day (BID) | ORAL | Status: DC
  Administered 2021-02-08 – 2021-02-10 (×3): 237 mL via ORAL

## 2021-02-08 MED ORDER — ADULT MULTIVITAMIN W/MINERALS CH
1.0000 | ORAL_TABLET | Freq: Every day | ORAL | Status: DC
  Administered 2021-02-08 – 2021-02-10 (×3): 1 via ORAL
  Filled 2021-02-08 (×3): qty 1

## 2021-02-08 MED ORDER — SODIUM CHLORIDE 0.9 % IV SOLN
300.0000 mg | Freq: Once | INTRAVENOUS | Status: DC
  Filled 2021-02-08: qty 15

## 2021-02-08 MED ORDER — ORAL CARE MOUTH RINSE
15.0000 mL | Freq: Two times a day (BID) | OROMUCOSAL | Status: DC
  Administered 2021-02-08 – 2021-02-10 (×5): 15 mL via OROMUCOSAL

## 2021-02-08 NOTE — Progress Notes (Signed)
[facility]  ROUNDING NOTE   Subjective:   Patient seen resting in bed, requesting to sit in chair Alert and oriented States she feels better today Appetite improving, denies nausea Currently on 2L [place]  Objective:  Vital signs in last 24 hours:  Temp:  [98.2 F (36.8 C)-98.5 F (36.9 C)] 98.2 F (36.8 C) (04/19 1148) Pulse Rate:  [45-140] 54 (04/19 1148) Resp:  [16-32] 16 (04/19 1148) BP: (111-149)/(36-133) 134/41 (04/19 1148) SpO2:  [78 %-100 %] 98 % (04/19 0754) Weight:  [56.6 kg-60.3 kg] 60.3 kg (04/19 0444)  Weight change:  Filed Weights   02/06/21 0434 02/07/21 2308 02/08/21 0444  Weight: 56.7 kg 56.6 kg 60.3 kg    Intake/Output: I/O last 3 completed shifts: In: 1067.5 [P.O.:240; I.V.:827.5] Out: 1550 [Urine:1550]   Intake/Output this shift:  Total I/O In: 360 [P.O.:360] Out: -   Physical Exam: General: NAD, laying in bed  Head: Normocephalic, atraumatic. Moist oral mucosal membranes  Eyes: Anicteric  Lungs:  +bilateral wheezing  Heart: Regular rate and rhythm  Abdomen:  Soft, nontender,   Extremities:  no peripheral edema.  Neurologic: Nonfocal, moving all four extremities  Skin: No lesions         Basic Metabolic Panel: Recent Labs  Lab 02/06/21 0503 02/06/21 1147 02/07/21 0651 02/08/21 0516  NA 136  --  134* 135  K 5.4*  --  5.2* 4.4  CL 110  --  107 104  CO2 18*  --  17* 23  GLUCOSE 133*  --  144* 176*  BUN 30*  --  43* 50*  CREATININE 2.74*  --  3.19* 3.10*  CALCIUM 8.7*  --  8.5* 8.0*  MG  --   --   --  2.2  PHOS  --  5.2*  --   --     Liver Function Tests: No results for input(s): AST, ALT, ALKPHOS, BILITOT, PROT, ALBUMIN in the last 168 hours. No results for input(s): LIPASE, AMYLASE in the last 168 hours. No results for input(s): AMMONIA in the last 168 hours.  CBC: Recent Labs  Lab 02/06/21 0503 02/08/21 0516  WBC 8.6 12.2*  NEUTROABS 6.0 10.7*  HGB 9.0* 7.7*  HCT 27.0* 22.7*  MCV 83.9 81.7  PLT 237 228     Cardiac Enzymes: No results for input(s): CKTOTAL, CKMB, CKMBINDEX, TROPONINI in the last 168 hours.  BNP: Invalid input(s): POCBNP  CBG: Recent Labs  Lab 02/07/21 1400 02/07/21 1656 02/07/21 2135 02/08/21 0800 02/08/21 1150  GLUCAP 121* 108* 177* 166* 139*    Microbiology: Results for orders placed or performed during the hospital encounter of 02/06/21  Resp Panel by RT-PCR (Flu A&B, Covid) Nasopharyngeal Swab     Status: None   Collection Time: 02/06/21  5:03 AM   Specimen: Nasopharyngeal Swab; Nasopharyngeal(NP) swabs in vial transport medium  Result Value Ref Range Status   SARS Coronavirus 2 by RT PCR NEGATIVE NEGATIVE Final    Comment: (NOTE) SARS-CoV-2 target nucleic acids are NOT DETECTED.  The SARS-CoV-2 RNA is generally detectable in upper respiratory specimens during the acute phase of infection. The lowest concentration of SARS-CoV-2 viral copies this assay can detect is 138 copies/mL. A negative result does not preclude SARS-Cov-2 infection and should not be used as the sole basis for treatment or other patient management decisions. A negative result may occur with  improper specimen collection/handling, submission of specimen other than nasopharyngeal swab, presence of viral mutation(s) within the areas targeted by this assay,  and inadequate number of viral copies(<138 copies/mL). A negative result must be combined with clinical observations, patient history, and epidemiological information. The expected result is Negative.  Fact Sheet for Patients:  EntrepreneurPulse.com.au  Fact Sheet for Healthcare Providers:  IncredibleEmployment.be  This test is no t yet approved or cleared by the [place] FDA and  has been authorized for detection and/or diagnosis of SARS-CoV-2 by FDA under an Emergency Use Authorization (EUA). This EUA will remain  in effect (meaning this test can be used) for the duration of  the COVID-19 declaration under Section 564(b)(1) of the Act, 21 U.S.C.section 360bbb-3(b)(1), unless the authorization is terminated  or revoked sooner.       Influenza A by PCR NEGATIVE NEGATIVE Final   Influenza B by PCR NEGATIVE NEGATIVE Final    Comment: (NOTE) The Xpert Xpress SARS-CoV-2/FLU/RSV plus assay is intended as an aid in the diagnosis of influenza from Nasopharyngeal swab specimens and should not be used as a sole basis for treatment. Nasal washings and aspirates are unacceptable for Xpert Xpress SARS-CoV-2/FLU/RSV testing.  Fact Sheet for Patients: EntrepreneurPulse.com.au  Fact Sheet for Healthcare Providers: IncredibleEmployment.be  This test is not yet approved or cleared by the [place] FDA and has been authorized for detection and/or diagnosis of SARS-CoV-2 by FDA under an Emergency Use Authorization (EUA). This EUA will remain in effect (meaning this test can be used) for the duration of the COVID-19 declaration under Section 564(b)(1) of the Act, 21 U.S.C. section 360bbb-3(b)(1), unless the authorization is terminated or revoked.  Performed at [facility], [place], [place] [address]   Respiratory (~20 pathogens) panel by PCR     Status: Abnormal   Collection Time: 02/06/21  9:57 AM   Specimen: Nasopharyngeal Swab; Respiratory  Result Value Ref Range Status   Adenovirus NOT DETECTED NOT DETECTED Final   Coronavirus 229E NOT DETECTED NOT DETECTED Final    Comment: (NOTE) The Coronavirus on the Respiratory Panel, DOES NOT test for the novel  Coronavirus (2019 nCoV)    Coronavirus HKU1 NOT DETECTED NOT DETECTED Final   Coronavirus NL63 NOT DETECTED NOT DETECTED Final   Coronavirus OC43 NOT DETECTED NOT DETECTED Final   Metapneumovirus NOT DETECTED NOT DETECTED Final   Rhinovirus / Enterovirus DETECTED (A) NOT DETECTED Final   Influenza A NOT DETECTED NOT DETECTED Final   Influenza B  NOT DETECTED NOT DETECTED Final   Parainfluenza Virus 1 NOT DETECTED NOT DETECTED Final   Parainfluenza Virus 2 NOT DETECTED NOT DETECTED Final   Parainfluenza Virus 3 NOT DETECTED NOT DETECTED Final   Parainfluenza Virus 4 NOT DETECTED NOT DETECTED Final   Respiratory Syncytial Virus NOT DETECTED NOT DETECTED Final   Bordetella pertussis NOT DETECTED NOT DETECTED Final   Bordetella Parapertussis NOT DETECTED NOT DETECTED Final   Chlamydophila pneumoniae NOT DETECTED NOT DETECTED Final   Mycoplasma pneumoniae NOT DETECTED NOT DETECTED Final    Comment: Performed at [facility], [address], [place],  [address]    Coagulation Studies: Recent Labs    02/06/21 1308 02/08/21 0516  LABPROT 18.7* 19.7*  INR 1.6* 1.7*    Urinalysis: No results for input(s): COLORURINE, LABSPEC, PHURINE, GLUCOSEU, HGBUR, BILIRUBINUR, KETONESUR, PROTEINUR, UROBILINOGEN, NITRITE, LEUKOCYTESUR in the last 72 hours.  Invalid input(s): APPERANCEUR    Imaging: No results found.   Medications:   . sodium chloride    . sodium bicarbonate 150 mEq in D5W infusion 50 mL/hr at 02/07/21 1915   .  atorvastatin  10 mg Oral QPM  . citalopram  40 mg Oral Daily  . donepezil  10 mg Oral QHS  . enoxaparin (LOVENOX) injection  30 mg Subcutaneous Q24H  . feeding supplement  237 mL Oral BID BM  . ferrous sulfate  325 mg Oral Q breakfast  . fluticasone  1 spray Each Nare Daily  . insulin aspart  0-9 Units Subcutaneous TID WC  . mouth rinse  15 mL Mouth Rinse BID  . multivitamin with minerals  1 tablet Oral Daily  . nicotine  14 mg Transdermal Daily  . pantoprazole  40 mg Oral BID  . polyethylene glycol  17 g Oral Daily  . predniSONE  40 mg Oral Q breakfast  . rOPINIRole  0.5 mg Oral QPM  . sodium chloride flush  3 mL Intravenous Q12H  . cyanocobalamin  1,000 mcg Oral Daily  . warfarin  2 mg Oral q1600  . Warfarin - Physician Dosing Inpatient   Does not apply q1600   sodium chloride,  acetaminophen, bisacodyl, ipratropium-albuterol, melatonin, ondansetron (ZOFRAN) IV, sodium chloride flush  Assessment/ Plan:  [name] is a 80 y.o. white female with right nephrectomy, diastolic congestive heart failure hypertension, hyperlipidemia, GERD, insulin dependent diabetes mellitus type II, atrial fibrillation, anemia, depression, dementia who was admitted to [facility] on 02/06/2021 for Acute CHF (congestive heart failure) ([place]) [I50.9]  1. Acute kidney injury with hyperkalemia, hyponatremia and metabolic acidosis on chronic kidney disease stage IV with proteinuria: Baseline creatinine of  2.63, GFR of 18 on 01/20/2021.  Chronic kidney disease secondary to solitary kidney, diabetes and hypertension.  No recent IV contrast exposure. No obstruction on ultrasound.  - Hold lisinopril   - Continue IVF - SPEP/UPEP pending.   2. Hypertension and acute exacerbation of chronic diastolic congestive heart failure:  - hold diuretics.   3. Diabetes mellitus type II with chronic kidney disease: insulin dependent. Hemoglobin A1c of 6.9% on 12/24/20.  - Glucose stable  4. Anemia of chronic kidney disease: normocytic. Hemoglobin 7.7. Consistent with iron deficiency.  Will monitor and treat if needed   LOS: 2 [place] 4/19/202212:22 PM

## 2021-02-08 NOTE — Progress Notes (Signed)
Patient Name: [name] Date of Encounter: 02/08/2021  Hospital Problem List     Principal Problem:   Acute on chronic diastolic CHF (congestive heart failure) (HCC) Active Problems:   Atrial fibrillation (HCC)   CKD stage 4 due to type 2 diabetes mellitus (HCC)   Nicotine dependence   History of anemia due to chronic kidney disease   Hyperkalemia   Acute renal failure superimposed on stage 4 chronic kidney disease (HCC)   Anemia associated with stage 4 chronic renal failure [facility])    Patient Profile     80 y.o. female with history of chronic diastolic heart failure, diabetes, atrial fibrillation, tobacco abuse, hypertension who presented to emergency room yesterday morning with complaints of shortness of breath.  She had no burning fever or chills but had orthopnea.  Pulse ox on room air was 90 to 91%.  Laboratories showed hemoglobin of 9.0 BNP of 233.7 with a normal troponin of 12 and 11.  Chest x-ray showed interstitial opacity consistent with chronic lung disease.  Was felt to that the pulmonary edema would be obscured.  Chest CT revealed small to medium mild lateral pleural effusions.  EKG showed atrial fibrillation with right axis deviation.  Patient was given Lasix in the emergency room.  As an outpatient she was on warfarin, amlodipine, atorvastatin, digoxin 0.125 mg daily, hydralazine twice daily, lisinopril 20 daily.  Most recent echocardiogram done on 1 month ago showed preserved LV function with an EF of 60 to 65% with mild aortic stenosis, left pleural effusion.  Urgent cardiac consult ordered today via secure messaging for A. fib with RVR.  Telemetry reveals atrial fibrillation with controlled ventricular response.  EKG done yesterday showed atrial fibrillation with a rate of 65.  Patient is currently not on any rate related medications.  Subjective   Somewhat lethargic.  Inpatient Medications    . atorvastatin  10 mg Oral QPM  . citalopram  40 mg Oral Daily  .  donepezil  10 mg Oral QHS  . enoxaparin (LOVENOX) injection  30 mg Subcutaneous Q24H  . feeding supplement  237 mL Oral BID BM  . ferrous sulfate  325 mg Oral Q breakfast  . fluticasone  1 spray Each Nare Daily  . insulin aspart  0-9 Units Subcutaneous TID WC  . mouth rinse  15 mL Mouth Rinse BID  . multivitamin with minerals  1 tablet Oral Daily  . nicotine  14 mg Transdermal Daily  . pantoprazole  40 mg Oral BID  . polyethylene glycol  17 g Oral Daily  . predniSONE  40 mg Oral Q breakfast  . rOPINIRole  0.5 mg Oral QPM  . sodium chloride flush  3 mL Intravenous Q12H  . cyanocobalamin  1,000 mcg Oral Daily  . warfarin  2 mg Oral q1600  . Warfarin - Physician Dosing Inpatient   Does not apply q1600    Vital Signs    Vitals:   02/08/21 0444 02/08/21 0528 02/08/21 0754 02/08/21 1148  BP:  (!) 118/49 (!) 124/40 (!) 134/41  Pulse:  (!) 52 62 (!) 54  Resp:  20 16 16   Temp:  98.4 F (36.9 C) 98.4 F (36.9 C) 98.2 F (36.8 C)  TempSrc:  Oral Oral   SpO2:  98% 98%   Weight: 60.3 kg     Height:        Intake/Output Summary (Last 24 hours) at 02/08/2021 1349 Last data filed at 02/08/2021 0948 Gross per 24 hour  Intake 1427.5 ml  Output 1000 ml  Net 427.5 ml   Filed Weights   02/06/21 0434 02/07/21 2308 02/08/21 0444  Weight: 56.7 kg 56.6 kg 60.3 kg    Physical Exam    GEN: Well nourished, well developed, in no acute distress.  HEENT: normal.  Neck: Supple, no JVD, carotid bruits, or masses. Cardiac: RRR, no murmurs, rubs, or gallops. No clubbing, cyanosis, edema.  Radials/DP/PT 2+ and equal bilaterally.  Respiratory:  Respirations regular and unlabored, clear to auscultation bilaterally. GI: Soft, nontender, nondistended, BS + x 4. MS: no deformity or atrophy. Skin: warm and dry, no rash. Neuro:  Strength and sensation are intact. Psych: Normal affect.  Labs    CBC Recent Labs    02/06/21 0503 02/08/21 0516  WBC 8.6 12.2*  NEUTROABS 6.0 10.7*  HGB 9.0* 7.7*   HCT 27.0* 22.7*  MCV 83.9 81.7  PLT 237 161   Basic Metabolic Panel Recent Labs    02/06/21 1147 02/07/21 0651 02/08/21 0516  NA  --  134* 135  K  --  5.2* 4.4  CL  --  107 104  CO2  --  17* 23  GLUCOSE  --  144* 176*  BUN  --  43* 50*  CREATININE  --  3.19* 3.10*  CALCIUM  --  8.5* 8.0*  MG  --   --  2.2  PHOS 5.2*  --   --    Liver Function Tests No results for input(s): AST, ALT, ALKPHOS, BILITOT, PROT, ALBUMIN in the last 72 hours. No results for input(s): LIPASE, AMYLASE in the last 72 hours. Cardiac Enzymes No results for input(s): CKTOTAL, CKMB, CKMBINDEX, TROPONINI in the last 72 hours. BNP Recent Labs    02/06/21 0503  BNP 233.7*   D-Dimer No results for input(s): DDIMER in the last 72 hours. Hemoglobin A1C No results for input(s): HGBA1C in the last 72 hours. Fasting Lipid Panel No results for input(s): CHOL, HDL, LDLCALC, TRIG, CHOLHDL, LDLDIRECT in the last 72 hours. Thyroid Function Tests No results for input(s): TSH, T4TOTAL, T3FREE, THYROIDAB in the last 72 hours.  Invalid input(s): FREET3  Telemetry    A. fib with controlled ventricular response  ECG    A. fib with controlled ventricular response  Radiology    CT CHEST WO CONTRAST  Result Date: 02/06/2021 CLINICAL DATA:  Shortness of breath, congestion.  History of COPD. EXAM: CT CHEST WITHOUT CONTRAST TECHNIQUE: Multidetector CT imaging of the chest was performed following the standard protocol without IV contrast. COMPARISON:  Chest x-ray from earlier same day. Chest CT dated 01/03/2019. FINDINGS: Cardiovascular: Borderline cardiomegaly. Aortic atherosclerosis. No thoracic aortic aneurysm. No significant pericardial effusion. Coronary artery calcifications, particularly dense within the LEFT anterior descending coronary artery. Mediastinum/Nodes: Moderately enlarged lymph nodes within the mediastinum, including a 1.3 cm short axis lymph node within the pre-vascular space and a 1.2 cm short  axis lymph node within the precarinal space. These lymph nodes are not significantly changed compared to the previous study suggesting benign/reactive lymphadenopathy. No new enlarged or morphologically abnormal lymph nodes. Esophagus is unremarkable.  Trachea is unremarkable. Lungs/Pleura: New bilateral pleural effusions, moderate-sized, with adjacent compressive atelectasis. Additional areas of subpleural reticulation and ground-glass opacities within the upper lobes, most compatible with chronic interstitial lung disease/fibrosis. No evidence of a superimposed consolidating pneumonia. Upper Abdomen: Limited images of the upper abdomen are unremarkable. Musculoskeletal: Degenerative spondylosis of the kyphotic thoracic spine, mild to moderate in degree. No acute appearing osseous abnormality. Ill-defined  fluid/edema within the subcutaneous soft tissues of the lower chest and upper abdomen indicating some degree of anasarca. IMPRESSION: 1. New bilateral pleural effusions, moderate-sized, with adjacent compressive atelectasis. 2. Additional areas of subpleural reticulation and ground-glass opacities within the bilateral upper lobes, most compatible with NSIP, hypersensitivity pneumonitis and/or respiratory bronchiolitis. No evidence of a superimposed consolidating pneumonia. 3. Coronary artery calcifications, particularly dense within the LEFT anterior descending coronary artery. Recommend correlation with any possible associated cardiac symptoms. 4. Anasarca. Aortic Atherosclerosis (ICD10-I70.0). Electronically Signed   By: Franki Cabot M.D.   On: 02/06/2021 09:18   US RENAL  Result Date: 02/06/2021 CLINICAL DATA:  Acute kidney failure.  History of right nephrectomy. EXAM: RENAL / URINARY TRACT ULTRASOUND COMPLETE COMPARISON:  None. FINDINGS: Right Kidney: Surgically absent.  The renal fossa is unremarkable. Left Kidney: Renal measurements: 10.2 x 5.7 x 4.9 cm = volume: 150 mL. Echogenicity within normal  limits. No mass or hydronephrosis visualized. Bladder: Appears normal for degree of bladder distention. Left jet visualized. Other: Incidentally noted right pleural effusion. IMPRESSION: 1.  Right kidney is surgically absent. 2.  Unremarkable sonographic appearance of the left kidney. 3.  Incidentally noted right pleural effusion. Electronically Signed   By: Audie Pinto M.D.   On: 02/06/2021 12:58   DG Chest Portable 1 View  Result Date: 02/06/2021 CLINICAL DATA:  Shortness of breath and nasal congestion with wheezing EXAM: PORTABLE CHEST 1 VIEW COMPARISON:  12/23/2020 FINDINGS: Generalized interstitial prominence. There is chronic lung disease by prior imaging. Borderline heart size with stable mediastinal contours. Blunting of the costophrenic sulci similar to prior. No pneumothorax. IMPRESSION: Interstitial opacity correlating with chronic lung disease on prior imaging. Superimposed pulmonary edema or atypical infection would easily be obscured. Electronically Signed   By: Monte Fantasia M.D.   On: 02/06/2021 05:30    Assessment & Plan    80 yo female who had diastolic chf who presented with increasing sob. CXR suggested volume overload. EKG showed afib with controlled vr.   CHF-echo read last month showed preserved lv funciton. Will continue with careful diuresis and follow clinical response, hemodynamics and electrolytes.   afib-rate is well controlled. Telemetry has artifact but no sustained rvr. Continue to follow. Is on warfarin. INR goal of 2-3.   Signed, Javier Docker Tae Robak MD 02/08/2021, 1:49 PM  Pager: [phone]

## 2021-02-08 NOTE — Progress Notes (Signed)
Mobility Specialist - Progress Note   02/08/21 1600  Mobility  Activity Ambulated in hall;Ambulated to bathroom  Level of Assistance Minimal assist, patient does 75% or more  Assistive Device Front wheel walker  Distance Ambulated (ft) 100 ft  Mobility Response Tolerated well  Mobility performed by Mobility specialist  $Mobility charge 1 Mobility    Pre-mobility: 60 HR, 99% SpO2 During mobility: 93 HR, 95% SpO2 Post-mobility: 69 HR, 96% SpO2   Pt ambulated to bathroom for urinal output prior to further ambulation into hallway. Utilizing 2L, pt does c/o SOB. States it's "hard to breathe anytime I move around". PLB educated and engaged. No LOB with RW. Close supervision, great carryover on commands. RPE 2/10.    Kathee Delton Mobility Specialist 02/08/21, 4:08 PM

## 2021-02-08 NOTE — Progress Notes (Signed)
PROGRESS NOTE    [name]  ZMO:[id number] DOB: December 05, 1940 DOA: 02/06/2021 PCP: Lavera Guise, MD   Chief complaint.  Shortness of breath. Brief Narrative:  Amy A Simpsonis a 80 y.o.femalewith medical history significant forchronic diastolic dysfunction CHF, diabetes mellitus with chronic kidney disease, atrial fibrillation, nicotine dependence and hypertension who presents to the ER for evaluation of shortness of breath, coughproductive of clear phlegm, nasal congestion and wheezing. In the emergency room, her chest x-ray showed chronic interstitial changes with possible superimposed pulmonary edema.  CT chest showed bilateral moderate pleural effusion, some groundglass changes with bilateral pleural effusion.   Assessment & Plan:   Principal Problem:   Acute on chronic diastolic CHF (congestive heart failure) (HCC) Active Problems:   Atrial fibrillation (HCC)   CKD stage 4 due to type 2 diabetes mellitus (HCC)   Nicotine dependence   History of anemia due to chronic kidney disease   Hyperkalemia   Acute renal failure superimposed on stage 4 chronic kidney disease (HCC)   Anemia associated with stage 4 chronic renal failure ([place])   #1.  Acute on chronic diastolic congestive heart failure. Chronic atrial fibrillation with rapid ventricular response. Patient condition seem to be improving today.  Still on 2 L oxygen with good oxygen saturation.  Volume status improved.  2.  Acute renal failure on chronic kidney disease stage IV. Hyperkalemia Metabolic acidosis. Patient has been seen by nephrology, deemed to be having acute renal failure on chronic kidney disease stage IV.  Renal function is gradually improving. Potassium normalized, metabolic acidosis improved.  #3.  Interstitial lung disease. Bilateral pleural effusion. Rhinovirus viral pneumonia. Patient had a significant interstitial changes, no evidence of bacterial pneumonia.  Respiratory panel was positive for  rhinovirus. Currently, patient is still on 2 L oxygen, with good oxygen saturation.  We will try to wean off. Continue oral steroids for bronchospasm, appear to be improving.  #4.  Type 2 diabetes uncontrolled with hyperglycemia. Continue current regimen for  5.  Anemia of chronic kidney disease. Iron deficient anemia. Received IV iron yesterday. Continue to monitor hemoglobin.      DVT prophylaxis: coumadin Code Status: full Family Communication: Not able to reach daughter Disposition Plan:  .   Status is: Inpatient  Remains inpatient appropriate because:Inpatient level of care appropriate due to severity of illness   Dispo: The patient is from: Home              Anticipated d/c is to: Home              Patient currently is not medically stable to d/c.   Difficult to place patient No        I/O last 3 completed shifts: In: 1067.5 [P.O.:240; I.V.:827.5] Out: 1550 [Urine:1550] Total I/O In: 360 [P.O.:360] Out: -      Consultants:   Nephrology, cardiology  Procedures: None  Antimicrobials: None  Subjective: Patient still on 2 L oxygen, but feels improved. Still has a cough with clear mucus. No fever or chills. No abdominal pain or nausea vomiting.  No diarrhea. No dysuria or hematuria. No chest pain or palpitation. No headache or dizziness.  Objective: Vitals:   02/08/21 0444 02/08/21 0528 02/08/21 0754 02/08/21 1148  BP:  (!) 118/49 (!) 124/40 (!) 134/41  Pulse:  (!) 52 62 (!) 54  Resp:  20 16 16   Temp:  98.4 F (36.9 C) 98.4 F (36.9 C) 98.2 F (36.8 C)  TempSrc:  Oral Oral   SpO2:  98% 98%   Weight: 60.3 kg     Height:        Intake/Output Summary (Last 24 hours) at 02/08/2021 1303 Last data filed at 02/08/2021 0948 Gross per 24 hour  Intake 1427.5 ml  Output 1000 ml  Net 427.5 ml   Filed Weights   02/06/21 0434 02/07/21 2308 02/08/21 0444  Weight: 56.7 kg 56.6 kg 60.3 kg    Examination:  General exam: Appears calm and  comfortable  Respiratory system: Coarse breathing sounds with mild wheezing. Respiratory effort normal. Cardiovascular system: S1 & S2 heard, RRR. No JVD, murmurs, rubs, gallops or clicks. No pedal edema. Gastrointestinal system: Abdomen is nondistended, soft and nontender. No organomegaly or masses felt. Normal bowel sounds heard. Central nervous system: Alert and oriented x3. No focal neurological deficits. Extremities: Symmetric 5 x 5 power. Skin: No rashes, lesions or ulcers Psychiatry: Mood & affect appropriate.     Data Reviewed: I have personally reviewed following labs and imaging studies  CBC: Recent Labs  Lab 02/06/21 0503 02/08/21 0516  WBC 8.6 12.2*  NEUTROABS 6.0 10.7*  HGB 9.0* 7.7*  HCT 27.0* 22.7*  MCV 83.9 81.7  PLT 237 093   Basic Metabolic Panel: Recent Labs  Lab 02/06/21 0503 02/06/21 1147 02/07/21 0651 02/08/21 0516  NA 136  --  134* 135  K 5.4*  --  5.2* 4.4  CL 110  --  107 104  CO2 18*  --  17* 23  GLUCOSE 133*  --  144* 176*  BUN 30*  --  43* 50*  CREATININE 2.74*  --  3.19* 3.10*  CALCIUM 8.7*  --  8.5* 8.0*  MG  --   --   --  2.2  PHOS  --  5.2*  --   --    GFR: Estimated Creatinine Clearance: 11.3 mL/min (A) (by C-G formula based on SCr of 3.1 mg/dL (H)). Liver Function Tests: No results for input(s): AST, ALT, ALKPHOS, BILITOT, PROT, ALBUMIN in the last 168 hours. No results for input(s): LIPASE, AMYLASE in the last 168 hours. No results for input(s): AMMONIA in the last 168 hours. Coagulation Profile: Recent Labs  Lab 02/06/21 1308 02/08/21 0516  INR 1.6* 1.7*   Cardiac Enzymes: No results for input(s): CKTOTAL, CKMB, CKMBINDEX, TROPONINI in the last 168 hours. BNP (last 3 results) No results for input(s): PROBNP in the last 8760 hours. HbA1C: No results for input(s): HGBA1C in the last 72 hours. CBG: Recent Labs  Lab 02/07/21 1400 02/07/21 1656 02/07/21 2135 02/08/21 0800 02/08/21 1150  GLUCAP 121* 108* 177* 166*  139*   Lipid Profile: No results for input(s): CHOL, HDL, LDLCALC, TRIG, CHOLHDL, LDLDIRECT in the last 72 hours. Thyroid Function Tests: No results for input(s): TSH, T4TOTAL, FREET4, T3FREE, THYROIDAB in the last 72 hours. Anemia Panel: Recent Labs    02/06/21 1147  VITAMINB12 744  FOLATE 16.8  FERRITIN 32  TIBC 280  IRON 19*   Sepsis Labs: Recent Labs  Lab 02/08/21 0516  PROCALCITON 0.10    Recent Results (from the past 240 hour(s))  Resp Panel by RT-PCR (Flu A&B, Covid) Nasopharyngeal Swab     Status: None   Collection Time: 02/06/21  5:03 AM   Specimen: Nasopharyngeal Swab; Nasopharyngeal(NP) swabs in vial transport medium  Result Value Ref Range Status   SARS Coronavirus 2 by RT PCR NEGATIVE NEGATIVE Final    Comment: (NOTE) SARS-CoV-2 target nucleic acids are NOT DETECTED.  The SARS-CoV-2 RNA is generally detectable  in upper respiratory specimens during the acute phase of infection. The lowest concentration of SARS-CoV-2 viral copies this assay can detect is 138 copies/mL. A negative result does not preclude SARS-Cov-2 infection and should not be used as the sole basis for treatment or other patient management decisions. A negative result may occur with  improper specimen collection/handling, submission of specimen other than nasopharyngeal swab, presence of viral mutation(s) within the areas targeted by this assay, and inadequate number of viral copies(<138 copies/mL). A negative result must be combined with clinical observations, patient history, and epidemiological information. The expected result is Negative.  Fact Sheet for Patients:  EntrepreneurPulse.com.au  Fact Sheet for Healthcare Providers:  IncredibleEmployment.be  This test is no t yet approved or cleared by the [place] FDA and  has been authorized for detection and/or diagnosis of SARS-CoV-2 by FDA under an Emergency Use Authorization (EUA). This EUA  will remain  in effect (meaning this test can be used) for the duration of the COVID-19 declaration under Section 564(b)(1) of the Act, 21 U.S.C.section 360bbb-3(b)(1), unless the authorization is terminated  or revoked sooner.       Influenza A by PCR NEGATIVE NEGATIVE Final   Influenza B by PCR NEGATIVE NEGATIVE Final    Comment: (NOTE) The Xpert Xpress SARS-CoV-2/FLU/RSV plus assay is intended as an aid in the diagnosis of influenza from Nasopharyngeal swab specimens and should not be used as a sole basis for treatment. Nasal washings and aspirates are unacceptable for Xpert Xpress SARS-CoV-2/FLU/RSV testing.  Fact Sheet for Patients: EntrepreneurPulse.com.au  Fact Sheet for Healthcare Providers: IncredibleEmployment.be  This test is not yet approved or cleared by the [place] FDA and has been authorized for detection and/or diagnosis of SARS-CoV-2 by FDA under an Emergency Use Authorization (EUA). This EUA will remain in effect (meaning this test can be used) for the duration of the COVID-19 declaration under Section 564(b)(1) of the Act, 21 U.S.C. section 360bbb-3(b)(1), unless the authorization is terminated or revoked.  Performed at [facility], [place], [place] [address]   Respiratory (~20 pathogens) panel by PCR     Status: Abnormal   Collection Time: 02/06/21  9:57 AM   Specimen: Nasopharyngeal Swab; Respiratory  Result Value Ref Range Status   Adenovirus NOT DETECTED NOT DETECTED Final   Coronavirus 229E NOT DETECTED NOT DETECTED Final    Comment: (NOTE) The Coronavirus on the Respiratory Panel, DOES NOT test for the novel  Coronavirus (2019 nCoV)    Coronavirus HKU1 NOT DETECTED NOT DETECTED Final   Coronavirus NL63 NOT DETECTED NOT DETECTED Final   Coronavirus OC43 NOT DETECTED NOT DETECTED Final   Metapneumovirus NOT DETECTED NOT DETECTED Final   Rhinovirus / Enterovirus DETECTED (A) NOT  DETECTED Final   Influenza A NOT DETECTED NOT DETECTED Final   Influenza B NOT DETECTED NOT DETECTED Final   Parainfluenza Virus 1 NOT DETECTED NOT DETECTED Final   Parainfluenza Virus 2 NOT DETECTED NOT DETECTED Final   Parainfluenza Virus 3 NOT DETECTED NOT DETECTED Final   Parainfluenza Virus 4 NOT DETECTED NOT DETECTED Final   Respiratory Syncytial Virus NOT DETECTED NOT DETECTED Final   Bordetella pertussis NOT DETECTED NOT DETECTED Final   Bordetella Parapertussis NOT DETECTED NOT DETECTED Final   Chlamydophila pneumoniae NOT DETECTED NOT DETECTED Final   Mycoplasma pneumoniae NOT DETECTED NOT DETECTED Final    Comment: Performed at [facility], [address] [place], [place] [address]  Radiology Studies: No results found.      Scheduled Meds: . atorvastatin  10 mg Oral QPM  . citalopram  40 mg Oral Daily  . donepezil  10 mg Oral QHS  . enoxaparin (LOVENOX) injection  30 mg Subcutaneous Q24H  . feeding supplement  237 mL Oral BID BM  . ferrous sulfate  325 mg Oral Q breakfast  . fluticasone  1 spray Each Nare Daily  . insulin aspart  0-9 Units Subcutaneous TID WC  . mouth rinse  15 mL Mouth Rinse BID  . multivitamin with minerals  1 tablet Oral Daily  . nicotine  14 mg Transdermal Daily  . pantoprazole  40 mg Oral BID  . polyethylene glycol  17 g Oral Daily  . predniSONE  40 mg Oral Q breakfast  . rOPINIRole  0.5 mg Oral QPM  . sodium chloride flush  3 mL Intravenous Q12H  . cyanocobalamin  1,000 mcg Oral Daily  . warfarin  2 mg Oral q1600  . Warfarin - Physician Dosing Inpatient   Does not apply q1600   Continuous Infusions: . sodium chloride    . iron sucrose    . sodium bicarbonate 150 mEq in D5W infusion 50 mL/hr at 02/08/21 1236     LOS: 2 days    Time spent: 35 minutes    Sharen Hones, MD [facility]   To contact the attending provider between 7A-7P or the covering provider during after hours 7P-7A, please log into the  web site [email or web address] and access using universal [place] password for that web site. If you do not have the password, please call the hospital operator.  02/08/2021, 1:03 PM

## 2021-02-08 NOTE — TOC Progression Note (Addendum)
Transition of Care [facility]) - Progression Note    Patient Details  Name: [name] MRN: [id number] Date of Birth: 03-09-1941  Transition of Care [facility]) CM/SW [place], RN Phone Number: 02/08/2021, 12:54 PM  Clinical Narrative:  Spoke with patient for short time before Nurse came in the room to provide care. Patient states she will be going to live with daughter, not to SNF. Called and spoke with daughter Romie Minus, who confirmed that daughter will go home and live with her. Both agreed to [facility] PT, [facility] notified, confirmation pending.  [facility] not in service. [facility] will provide Home Health PT.    Expected Discharge Plan: [place] Barriers to Discharge: Continued Medical Work up  Expected Discharge Plan and Services Expected Discharge Plan: [place]   Discharge Planning Services: CM Consult Post Acute Care Choice: NA Living arrangements for the past 2 months: Mobile Home                 DME Arranged: N/A DME Agency: NA       HH Arranged: PT,RN [place] Bar: [place] ([place]) Date [place]: 02/08/21 Time [place]: 1253 Representative spoke with at [place]: [place] (SDOH) Interventions    Readmission Risk Interventions No flowsheet data found.

## 2021-02-08 NOTE — Progress Notes (Signed)
Initial Nutrition Assessment  DOCUMENTATION CODES:   Not applicable  INTERVENTION:   Ensure Enlive po BID, each supplement provides 350 kcal and 20 grams of protein  MVI po daily   Pt at [facility] refeed risk; recommend monitor potassium, magnesium and phosphorus labs daily until stable  NUTRITION DIAGNOSIS:   Inadequate oral intake related to acute illness as evidenced by per patient/family report.  GOAL:   Patient will meet greater than or equal to 90% of their needs  MONITOR:   PO intake,Supplement acceptance,Weight trends,Labs,Skin,I & O's  REASON FOR ASSESSMENT:   Malnutrition Screening Tool    ASSESSMENT:   80 y.o. female with medical history significant for chronic diastolic dysfunction CHF, diabetes mellitus, chronic kidney disease IV, atrial fibrillation, nicotine dependence and hypertension who is admitted with CHF exacerbation.  Met with pt in room today. Pt reports decreased appetite and oral intake for the past several years but reports that her appetite has been poor for the past few days pta. Pt reports that her oral intake is improving in hospital; pt ate 75% of her breakfast this morning. Pt reports that she does drink chocolate Ensure at home sometimes and would like to have this in hospital. RD will add supplements and MVI to help pt meet her estimated needs. Pt reports difficulty chewing tough foods as she reports she is edentulous. RD will request in [facility] for tough meats to be chopped. Pt reports that she used to weigh 230lbs several years ago. Per chart, pt appears weight stable over the past year but is currently up ~6lbs from her UBW.   Medications reviewed and include: celexa, lovenox, ferrous sulfate, insulin, MVI, protonix, miralax, prednisone, B12, Warfarin, Na Bicarbonate   Labs reviewed: K 4.4 wnl, BUN 50(H), creat 3.10(H), Mg 2.2 wnl Wbc- 12.2(H), Hgb 7.7(L), Hct 22.7(L) cbgs- 166, 139 wnl AIC 6.9(H)- 3/4  NUTRITION - FOCUSED PHYSICAL  EXAM:  Flowsheet Row Most Recent Value  Orbital Region No depletion  Upper Arm Region Mild depletion  Thoracic and Lumbar Region No depletion  Buccal Region No depletion  Temple Region No depletion  Clavicle Bone Region Mild depletion  Clavicle and Acromion Bone Region Mild depletion  Scapular Bone Region No depletion  Dorsal Hand No depletion  Patellar Region Mild depletion  Anterior Thigh Region Mild depletion  Posterior Calf Region Mild depletion  Edema (RD Assessment) None  Hair Reviewed  Eyes Reviewed  Mouth Reviewed  Skin Reviewed  Nails Reviewed     Diet Order:   Diet Order            Diet Carb Modified Fluid consistency: Thin; Room service appropriate? Yes  Diet effective now                EDUCATION NEEDS:   Education needs have been addressed  Skin:  Skin Assessment: Reviewed RN Assessment  Last BM:  4/16  Height:   Ht Readings from Last 1 Encounters:  02/07/21 '4\' 10"'  (1.473 m)    Weight:   Wt Readings from Last 1 Encounters:  02/08/21 60.3 kg    Ideal Body Weight:  44 kg  BMI:  Body mass index is 27.8 kg/m.  Estimated Nutritional Needs:   Kcal:  1400-1600kcal/day  Protein:  60-70g/day  Fluid:  1.3L/day  Koleen Distance MS, RD, LDN Please refer to [facility] for RD and/or RD on-call/weekend/after hours pager

## 2021-02-08 NOTE — Evaluation (Signed)
Clinical/Bedside Swallow Evaluation Patient Details  Name: [name] MRN: [id number] Date of Birth: 1941/07/28  Today's Date: 02/08/2021 Time: SLP Start Time (ACUTE ONLY): 1145 SLP Stop Time (ACUTE ONLY): 1245 SLP Time Calculation (min) (ACUTE ONLY): 60 min  Past Medical History:  Past Medical History:  Diagnosis Date  . Anemia   . Atrial fibrillation ([place])   . Congestive heart failure (CHF) ([place])   . Diabetes mellitus without complication ([place])   . GERD (gastroesophageal reflux disease)   . Hyperlipidemia   . Hypertension    Past Surgical History:  Past Surgical History:  Procedure Laterality Date  . ABDOMINAL HYSTERECTOMY    . APPENDECTOMY    . CATARACT EXTRACTION    . CHOLECYSTECTOMY    . HERNIA REPAIR    . right knee replacement    . right nephroectomy     HPI:  Pt is a 80 y.o. female with medical history significant for chronic diastolic dysfunction CHF, diabetes mellitus with chronic kidney disease, atrial fibrillation, nicotine dependence and hypertension who presents to the ER for evaluation of shortness of breath, cough productive of clear phlegm, nasal congestion and wheezing -- admitted w/ Acute on chronic diastolic dysfunction CHF.  Patient states that she has had symptoms for a couple of weeks but it got worse on the night prior to her admission.  Patient states that she had difficulty catching her breath and was unable to lay flat in bed and so she called EMS.  Chest x-ray shows presented to interstitial opacity correlating with chronic lung disease on prior imaging. Superimposed pulmonary edema or atypical infection would easily be obscured.  CT scan of the chest without contrast shows new bilateral pleural effusions, moderate-sized, with adjacent  compressive atelectasis. Additional areas of subpleural reticulation and ground-glass  opacities within the bilateral upper lobes, most compatible with NSIP, hypersensitivity pneumonitis and/or respiratory bronchiolitis.  No evidence of a superimposed consolidating pneumonia.  Coronary artery calcifications, particularly dense within the LEFT anterior descending coronary artery.   Assessment / Plan / Recommendation Clinical Impression  Pt appears to present w/ grossly adequate oropharyngeal phase swallow w/ No oropharyngeal phase dysphagia noted, No neuromuscular deficits noted. Pt consumed po trials w/ No immediate, overt clinical s/s of aspiration during po trials. Pt appears at reduced risk for aspiration when following general aspiration precautions. However, during this session, pt exhibited a chronic half-cough, min congested w/ exertion of moving, talking. This cough was also noted during oral intake. The cough did NOT increase in intensity or frequency w/ food/liquid swallowing. Suspect the cough could be related to impact of pt's baseline Pulmonary status. Also, pt exhibited MOD+ BELCHING toward end, and after, the meal. ANY Dysmotility or Regurgitation of Reflux material can increase risk for aspiration of the Reflux material during Retrograde flow thus impact Pulmonary status. Pt is EDENTULOUS as well. During po trials, pt consumed all consistencies w/ no immediate, overt coughing, decline in vocal quality, or change in respiratory presentation during/post trials. Oral phase appeared [facility] w/ timely bolus management and control of bolus propulsion for A-P transfer for swallowing. Increased Mastication time/effort noted w/ meats and some foods of increased texture d/t EDENTULOUS status. Oral clearing achieved w/ all trial consistencies given Time. OM Exam appeared [facility] w/ no unilateral weakness noted. Speech Clear. Pt fed self w/ setup support -- needed Min+ cues to reposition more Upright in bed to eat/drink (aspiration precautions discussed w/ pt including this for safer oral intake). Pt has some overall Vision deficits. Recommend  continue a Mech Soft/Regular consistency diet w/ well-Cut meats, moistened foods; Thin  liquids. Recommend general aspiration precautions, Pills WHOLE in Puree for safer, easier swallowing IF any difficulty swallow w/ liquids. Encouraged Rest Breaks during meals for conservation of energy and for Esophageal phase clearing. GERD precautions to reduce REFLUX behavior and Regurgitation. Education given on Pills in Puree; food consistencies and easy to eat options; general aspiration and Reflux precautions. NSG to reconsult if any new needs arise. NSG agreed stating no overt dysphagia witnessed by her as well today w/ po's. MD updated. SLP Visit Diagnosis: Dysphagia, unspecified (R13.10) (Edentulous)    Aspiration Risk   (reduced following precautions)    Diet Recommendation  Mech Soft/regular diet w/ cut meats, moistened foods; Thin liquids. General aspiration precautions. REFLUX precautions.   Medication Administration: Whole meds with puree (IF needed for ease of swallowing)    Other  Recommendations Recommended Consults: Consider GI evaluation;Consider esophageal assessment (Dietician f/u) Oral Care Recommendations: Oral care BID;Staff/trained caregiver to provide oral care Other Recommendations:  (n/a)   Follow up Recommendations None      Frequency and Duration  (n/a)   (n/a)       Prognosis Prognosis for Safe Diet Advancement: Fair (Good) Barriers/Prognosis Comment: Edentulous; GERD      Swallow Study   General Date of Onset: 02/06/21 HPI: Pt is a 80 y.o. female with medical history significant for chronic diastolic dysfunction CHF, diabetes mellitus with chronic kidney disease, atrial fibrillation, nicotine dependence and hypertension who presents to the ER for evaluation of shortness of breath, cough productive of clear phlegm, nasal congestion and wheezing -- admitted w/ Acute on chronic diastolic dysfunction CHF.  Patient states that she has had symptoms for a couple of weeks but it got worse on the night prior to her admission.  Patient states that she had  difficulty catching her breath and was unable to lay flat in bed and so she called EMS.  Chest x-ray shows presented to interstitial opacity correlating with chronic lung disease on prior imaging. Superimposed pulmonary edema or atypical infection would easily be obscured.  CT scan of the chest without contrast shows new bilateral pleural effusions, moderate-sized, with adjacent  compressive atelectasis. Additional areas of subpleural reticulation and ground-glass  opacities within the bilateral upper lobes, most compatible with NSIP, hypersensitivity pneumonitis and/or respiratory bronchiolitis. No evidence of a superimposed consolidating pneumonia.  Coronary artery calcifications, particularly dense within the LEFT anterior descending coronary artery. Type of Study: Bedside Swallow Evaluation Previous Swallow Assessment: none Diet Prior to this Study: Regular;Thin liquids Temperature Spikes Noted: No (wbc 12.2) Respiratory Status: Nasal cannula (2L) History of Recent Intubation: No Behavior/Cognition: Alert;Cooperative;Pleasant mood Oral Cavity Assessment: Within Functional Limits Oral Care Completed by SLP: Yes Oral Cavity - Dentition: Edentulous Vision: Functional for self-feeding (but impaired in general) Self-Feeding Abilities: Able to feed self;Needs assist;Needs set up Patient Positioning: Upright in bed (needed min positioning support) Baseline Vocal Quality: Normal Volitional Cough: Strong;Congested (min) Volitional Swallow: Able to elicit    Oral/Motor/Sensory Function Overall Oral Motor/Sensory Function: Within functional limits   Ice Chips Ice chips: Within functional limits Presentation: Spoon (fed; 3 trials)   Thin Liquid Thin Liquid: Within functional limits Presentation: Cup;Self Fed;Straw (5 trials via each) Other Comments: pt exhibited an inconsistent, half-cough b/t trials but this did not appeared directly related to the swallowing and did not increase in intensity or  frequency as trials continued. Vocal quality clear b/t trials.    Nectar Thick Nectar Thick Liquid:  Not tested   Honey Thick Honey Thick Liquid: Not tested   Puree Puree: Within functional limits Presentation: Self Fed;Spoon (6+ozs) Other Comments: similar half-cough noted inconsistency b/t trials   Solid     Solid: Impaired Presentation: Self Fed;Spoon (10 trials) Oral Phase Impairments: Impaired mastication (edentulous) Oral Phase Functional Implications: Impaired mastication (edentulous) Pharyngeal Phase Impairments:  (none) Other Comments: similar half-cough noted inconsistency b/t trials        Orinda Kenner, MS, CCC-SLP Speech Language Pathologist Rehab Services [phone] [facility] 02/08/2021,2:23 PM

## 2021-02-09 DIAGNOSIS — D631 Anemia in chronic kidney disease: Secondary | ICD-10-CM

## 2021-02-09 LAB — CBC WITH DIFFERENTIAL/PLATELET
Abs Immature Granulocytes: 0.18 10*3/uL — ABNORMAL HIGH (ref 0.00–0.07)
Basophils Absolute: 0 10*3/uL (ref 0.0–0.1)
Basophils Relative: 0 %
Eosinophils Absolute: 0 10*3/uL (ref 0.0–0.5)
Eosinophils Relative: 0 %
HCT: 23.3 % — ABNORMAL LOW (ref 36.0–46.0)
Hemoglobin: 7.7 g/dL — ABNORMAL LOW (ref 12.0–15.0)
Immature Granulocytes: 2 %
Lymphocytes Relative: 15 %
Lymphs Abs: 1.5 10*3/uL (ref 0.7–4.0)
MCH: 27.3 pg (ref 26.0–34.0)
MCHC: 33 g/dL (ref 30.0–36.0)
MCV: 82.6 fL (ref 80.0–100.0)
Monocytes Absolute: 0.5 10*3/uL (ref 0.1–1.0)
Monocytes Relative: 5 %
Neutro Abs: 7.6 10*3/uL (ref 1.7–7.7)
Neutrophils Relative %: 78 %
Platelets: 191 10*3/uL (ref 150–400)
RBC: 2.82 MIL/uL — ABNORMAL LOW (ref 3.87–5.11)
RDW: 22.6 % — ABNORMAL HIGH (ref 11.5–15.5)
WBC: 9.7 10*3/uL (ref 4.0–10.5)
nRBC: 0 % (ref 0.0–0.2)

## 2021-02-09 LAB — BASIC METABOLIC PANEL
Anion gap: 8 (ref 5–15)
BUN: 54 mg/dL — ABNORMAL HIGH (ref 8–23)
CO2: 26 mmol/L (ref 22–32)
Calcium: 7.8 mg/dL — ABNORMAL LOW (ref 8.9–10.3)
Chloride: 101 mmol/L (ref 98–111)
Creatinine, Ser: 2.92 mg/dL — ABNORMAL HIGH (ref 0.44–1.00)
GFR, Estimated: 16 mL/min — ABNORMAL LOW (ref >60)
Glucose, Bld: 161 mg/dL — ABNORMAL HIGH (ref 70–99)
Potassium: 4.1 mmol/L (ref 3.5–5.1)
Sodium: 135 mmol/L (ref 135–145)

## 2021-02-09 LAB — MAGNESIUM: Magnesium: 2.1 mg/dL (ref 1.7–2.4)

## 2021-02-09 LAB — GLUCOSE, CAPILLARY
Glucose-Capillary: 146 mg/dL — ABNORMAL HIGH (ref 70–99)
Glucose-Capillary: 196 mg/dL — ABNORMAL HIGH (ref 70–99)
Glucose-Capillary: 252 mg/dL — ABNORMAL HIGH (ref 70–99)
Glucose-Capillary: 245 mg/dL — ABNORMAL HIGH (ref 70–99)

## 2021-02-09 LAB — PROTIME-INR
INR: 1.7 — ABNORMAL HIGH (ref 0.8–1.2)
Prothrombin Time: 19.7 seconds — ABNORMAL HIGH (ref 11.4–15.2)

## 2021-02-09 NOTE — Progress Notes (Signed)
[facility]  ROUNDING NOTE   Subjective:   Patient seen resting in bed Alert and oriented Appetite improving, denies nausea Currently on 2L [place]  Objective:  Vital signs in last 24 hours:  Temp:  [97.4 F (36.3 C)-98.5 F (36.9 C)] 97.4 F (36.3 C) (04/20 0827) Pulse Rate:  [54-64] 63 (04/20 0827) Resp:  [16-18] 18 (04/20 0827) BP: (116-134)/(36-60) 127/49 (04/20 0827) SpO2:  [99 %-100 %] 100 % (04/20 0827) Weight:  [57.6 kg] 57.6 kg (04/20 0500)  Weight change: 1 kg Filed Weights   02/07/21 2308 02/08/21 0444 02/09/21 0500  Weight: 56.6 kg 60.3 kg 57.6 kg    Intake/Output: I/O last 3 completed shifts: In: 3105.1 [P.O.:1080; I.V.:2025.1] Out: 2175 [Urine:2175]   Intake/Output this shift:  No intake/output data recorded.  Physical Exam: General: NAD, laying in bed  Head: Normocephalic, atraumatic. Moist oral mucosal membranes  Eyes: Anicteric  Lungs:  +bilateral wheezing, O2 2L [place]  Heart: Regular rate and rhythm  Abdomen:  Soft, nontender,   Extremities:  no peripheral edema.  Neurologic: Nonfocal, moving all four extremities  Skin: No lesions         Basic Metabolic Panel: Recent Labs  Lab 02/06/21 0503 02/06/21 1147 02/07/21 0651 02/08/21 0516 02/09/21 0529  NA 136  --  134* 135 135  K 5.4*  --  5.2* 4.4 4.1  CL 110  --  107 104 101  CO2 18*  --  17* 23 26  GLUCOSE 133*  --  144* 176* 161*  BUN 30*  --  43* 50* 54*  CREATININE 2.74*  --  3.19* 3.10* 2.92*  CALCIUM 8.7*  --  8.5* 8.0* 7.8*  MG  --   --   --  2.2 2.1  PHOS  --  5.2*  --   --   --     Liver Function Tests: No results for input(s): AST, ALT, ALKPHOS, BILITOT, PROT, ALBUMIN in the last 168 hours. No results for input(s): LIPASE, AMYLASE in the last 168 hours. No results for input(s): AMMONIA in the last 168 hours.  CBC: Recent Labs  Lab 02/06/21 0503 02/08/21 0516 02/09/21 0529  WBC 8.6 12.2* 9.7  NEUTROABS 6.0 10.7* 7.6  HGB 9.0* 7.7* 7.7*  HCT 27.0* 22.7*  23.3*  MCV 83.9 81.7 82.6  PLT 237 228 191    Cardiac Enzymes: No results for input(s): CKTOTAL, CKMB, CKMBINDEX, TROPONINI in the last 168 hours.  BNP: Invalid input(s): POCBNP  CBG: Recent Labs  Lab 02/07/21 2135 02/08/21 0800 02/08/21 1150 02/08/21 2352 02/09/21 0828  GLUCAP 177* 166* 139* 207* 146*    Microbiology: Results for orders placed or performed during the hospital encounter of 02/06/21  Resp Panel by RT-PCR (Flu A&B, Covid) Nasopharyngeal Swab     Status: None   Collection Time: 02/06/21  5:03 AM   Specimen: Nasopharyngeal Swab; Nasopharyngeal(NP) swabs in vial transport medium  Result Value Ref Range Status   SARS Coronavirus 2 by RT PCR NEGATIVE NEGATIVE Final    Comment: (NOTE) SARS-CoV-2 target nucleic acids are NOT DETECTED.  The SARS-CoV-2 RNA is generally detectable in upper respiratory specimens during the acute phase of infection. The lowest concentration of SARS-CoV-2 viral copies this assay can detect is 138 copies/mL. A negative result does not preclude SARS-Cov-2 infection and should not be used as the sole basis for treatment or other patient management decisions. A negative result may occur with  improper specimen collection/handling, submission of specimen other than nasopharyngeal swab,  presence of viral mutation(s) within the areas targeted by this assay, and inadequate number of viral copies(<138 copies/mL). A negative result must be combined with clinical observations, patient history, and epidemiological information. The expected result is Negative.  Fact Sheet for Patients:  EntrepreneurPulse.com.au  Fact Sheet for Healthcare Providers:  IncredibleEmployment.be  This test is no t yet approved or cleared by the [place] FDA and  has been authorized for detection and/or diagnosis of SARS-CoV-2 by FDA under an Emergency Use Authorization (EUA). This EUA will remain  in effect (meaning this  test can be used) for the duration of the COVID-19 declaration under Section 564(b)(1) of the Act, 21 U.S.C.section 360bbb-3(b)(1), unless the authorization is terminated  or revoked sooner.       Influenza A by PCR NEGATIVE NEGATIVE Final   Influenza B by PCR NEGATIVE NEGATIVE Final    Comment: (NOTE) The Xpert Xpress SARS-CoV-2/FLU/RSV plus assay is intended as an aid in the diagnosis of influenza from Nasopharyngeal swab specimens and should not be used as a sole basis for treatment. Nasal washings and aspirates are unacceptable for Xpert Xpress SARS-CoV-2/FLU/RSV testing.  Fact Sheet for Patients: EntrepreneurPulse.com.au  Fact Sheet for Healthcare Providers: IncredibleEmployment.be  This test is not yet approved or cleared by the [place] FDA and has been authorized for detection and/or diagnosis of SARS-CoV-2 by FDA under an Emergency Use Authorization (EUA). This EUA will remain in effect (meaning this test can be used) for the duration of the COVID-19 declaration under Section 564(b)(1) of the Act, 21 U.S.C. section 360bbb-3(b)(1), unless the authorization is terminated or revoked.  Performed at [facility], [place], [place] [address]   Respiratory (~20 pathogens) panel by PCR     Status: Abnormal   Collection Time: 02/06/21  9:57 AM   Specimen: Nasopharyngeal Swab; Respiratory  Result Value Ref Range Status   Adenovirus NOT DETECTED NOT DETECTED Final   Coronavirus 229E NOT DETECTED NOT DETECTED Final    Comment: (NOTE) The Coronavirus on the Respiratory Panel, DOES NOT test for the novel  Coronavirus (2019 nCoV)    Coronavirus HKU1 NOT DETECTED NOT DETECTED Final   Coronavirus NL63 NOT DETECTED NOT DETECTED Final   Coronavirus OC43 NOT DETECTED NOT DETECTED Final   Metapneumovirus NOT DETECTED NOT DETECTED Final   Rhinovirus / Enterovirus DETECTED (A) NOT DETECTED Final   Influenza A NOT  DETECTED NOT DETECTED Final   Influenza B NOT DETECTED NOT DETECTED Final   Parainfluenza Virus 1 NOT DETECTED NOT DETECTED Final   Parainfluenza Virus 2 NOT DETECTED NOT DETECTED Final   Parainfluenza Virus 3 NOT DETECTED NOT DETECTED Final   Parainfluenza Virus 4 NOT DETECTED NOT DETECTED Final   Respiratory Syncytial Virus NOT DETECTED NOT DETECTED Final   Bordetella pertussis NOT DETECTED NOT DETECTED Final   Bordetella Parapertussis NOT DETECTED NOT DETECTED Final   Chlamydophila pneumoniae NOT DETECTED NOT DETECTED Final   Mycoplasma pneumoniae NOT DETECTED NOT DETECTED Final    Comment: Performed at [facility], [address], [place], [place] [address]    Coagulation Studies: Recent Labs    02/06/21 1308 02/08/21 0516 02/09/21 0740  LABPROT 18.7* 19.7* 19.7*  INR 1.6* 1.7* 1.7*    Urinalysis: No results for input(s): COLORURINE, LABSPEC, PHURINE, GLUCOSEU, HGBUR, BILIRUBINUR, KETONESUR, PROTEINUR, UROBILINOGEN, NITRITE, LEUKOCYTESUR in the last 72 hours.  Invalid input(s): APPERANCEUR    Imaging: No results found.   Medications:   . sodium chloride     .  atorvastatin  10 mg Oral QPM  . citalopram  40 mg Oral Daily  . donepezil  10 mg Oral QHS  . enoxaparin (LOVENOX) injection  30 mg Subcutaneous Q24H  . feeding supplement  237 mL Oral BID BM  . ferrous sulfate  325 mg Oral Q breakfast  . fluticasone  1 spray Each Nare Daily  . insulin aspart  0-9 Units Subcutaneous TID WC  . mouth rinse  15 mL Mouth Rinse BID  . multivitamin with minerals  1 tablet Oral Daily  . nicotine  14 mg Transdermal Daily  . pantoprazole  40 mg Oral BID  . polyethylene glycol  17 g Oral Daily  . predniSONE  40 mg Oral Q breakfast  . rOPINIRole  0.5 mg Oral QPM  . sodium chloride flush  3 mL Intravenous Q12H  . cyanocobalamin  1,000 mcg Oral Daily  . warfarin  2 mg Oral q1600  . Warfarin - Physician Dosing Inpatient   Does not apply q1600   sodium chloride,  acetaminophen, bisacodyl, ipratropium-albuterol, melatonin, ondansetron (ZOFRAN) IV, sodium chloride flush  Assessment/ Plan:  [name] is a 80 y.o. white female with right nephrectomy, diastolic congestive heart failure hypertension, hyperlipidemia, GERD, insulin dependent diabetes mellitus type II, atrial fibrillation, anemia, depression, dementia who was admitted to [facility] on 02/06/2021 for Acute CHF (congestive heart failure) ([place]) [I50.9]  1. Acute kidney injury with hyperkalemia, hyponatremia and metabolic acidosis on chronic kidney disease stage IV with proteinuria: Baseline creatinine of  2.63, GFR of 18 on 01/20/2021.  Chronic kidney disease secondary to solitary kidney, diabetes and hypertension.  No recent IV contrast exposure. No obstruction on ultrasound.  - Hold lisinopril   - Continue IVF - SPEP/UPEP unremarkable   2. Hypertension and acute exacerbation of chronic diastolic congestive heart failure:  - hold diuretics.   3. Diabetes mellitus type II with chronic kidney disease: insulin dependent. Hemoglobin A1c of 6.9% on 12/24/20.  - Glucose stable  4. Anemia of chronic kidney disease: normocytic. Hemoglobin 7.7. Consistent with iron deficiency.  Will monitor and treat if needed   LOS: 3 [place] 4/20/202210:01 AM

## 2021-02-09 NOTE — Consult Note (Addendum)
   Heart Failure Nurse Navigator Note  HFpEF 60-65%.  Normal right ventricular systolic function.  Mild aortic stenosis.  Mild left atrial enlargement.  Presented to the emergency room with complaints of shortness of breath.  Comorbidities:  Anemia Atrial fibrillation Diabetes Hyperlipidemia Hypertension  She continues to smoke 1 pack of cigarettes over 2 days.  Medications:  Lipitor 10 mg Warfarin  Labs:  Sodium 135, potassium 4.1, chloride 101, CO2 26, BUN 54 up from 50 of yesterday, creatinine 2.93 down from 3.1 of yesterday. Intake is 2037 mL Output 1157 mL Weight is 57.6 down from 68.3 of yesterday.   Assessment:  General-she is awake and alert lying in bed in no acute distress.  HEENT-edentulous, pupils are equal.  Cardiac- heart tones of regular rate and rhythm  Abdomen- soft nontender  Lower extremity-there is no edema noted.  Psych is pleasant and appropriate answers questions.  Neurologic-speech is clear.   Initial meeting with patient.  There is no family at the bedside.  States that she lives with her daughter and her daughter is the one that does the cooking.  She denies use of any prepared foods, but does like to eat tomato soup from the can and does not use salt at the table.  She uses pepper to seasoning her food.  States that she has not had much of an appetite but tries to make her self eat.   She does not have a scale.  We will contact TOC.  Left information on living with heart failure, zone magnet and information about the heart failure clinic along with appointment time.   Pricilla Riffle RN CHFN

## 2021-02-09 NOTE — Care Management Important Message (Signed)
Important Message  Patient Details  Name: [name] MRN: [id number] Date of Birth: [birth year]   Medicare Important Message Given:  Yes     Dannette Barbara 02/09/2021, 11:01 AM

## 2021-02-09 NOTE — Progress Notes (Signed)
PROGRESS NOTE    [name]  YBW:[id number] DOB: 02/19/1941 DOA: 02/06/2021 PCP: Lavera Guise, MD   Chief complaint.  Shortness of breath. Brief Narrative:  Amy A Simpsonis a 81 y.o.femalewith medical history significant forchronic diastolic dysfunction CHF, diabetes mellitus with chronic kidney disease, atrial fibrillation, nicotine dependence and hypertension who presents to the ER for evaluation of shortness of breath, coughproductive of clear phlegm, nasal congestion and wheezing. In the emergency room, her chest x-ray showed chronic interstitial changes with possible superimposed pulmonary edema. CT chest showed bilateral moderate pleural effusion,some groundglass changes with bilateral pleural effusion.   Assessment & Plan:   Principal Problem:   Acute on chronic diastolic CHF (congestive heart failure) (HCC) Active Problems:   Atrial fibrillation (HCC)   CKD stage 4 due to type 2 diabetes mellitus (HCC)   Nicotine dependence   History of anemia due to chronic kidney disease   Hyperkalemia   Acute renal failure superimposed on stage 4 chronic kidney disease (HCC)   Anemia associated with stage 4 chronic renal failure ([place])  #1. Acute on chronic diastolic congestive heart failure. Chronic atrial fibrillation with rapid ventricular response. Condition had improved, continue wean off oxygen. Continue warfarin.  2.  Acute renal failure on chronic kidney disease stage IV. Hyperkalemia Metabolic acidosis. Patient also improved today.  Renal function is better.  She received gentle rehydration.  #3.  Interstitial lung disease. Bilateral pleural effusion. Rhinovirus viral pneumonia. Patient still has significant bronchospasm today, will continue steroids. Continue wean off oxygen.  4.  Type 2 diabetes with hyperglycemia Continue current regimen.  5.  Anemia of chronic kidney disease. Iron deficient anemia. Received IV iron, will continue oral  supplement.   DVT prophylaxis: Warfarin Code Status: Full Family Communication:  Disposition Plan:  .   Status is: Inpatient  Remains inpatient appropriate because:Inpatient level of care appropriate due to severity of illness   Dispo: The patient is from: Home              Anticipated d/c is to: Home              Patient currently is not medically stable to d/c.   Difficult to place patient No        I/O last 3 completed shifts: In: 3105.1 [P.O.:1080; I.V.:2025.1] Out: 2175 [Urine:2175] Total I/O In: 120 [P.O.:120] Out: 43 [Urine:350]     Consultants:   Nephrology  Procedures: None  Antimicrobials: None  Subjective: Patient still has some wheezing with short of breath with exertion.  Still on oxygen. No fever or chills. No abdominal pain or nausea vomiting. No dysuria or hematuria. No headache or dizziness. No chest pain or palpitation.  Objective: Vitals:   02/09/21 0359 02/09/21 0500 02/09/21 0827 02/09/21 1234  BP: 124/60  (!) 127/49 (!) 122/35  Pulse: 64  63 62  Resp: 17  18 16   Temp: (!) 97.5 F (36.4 C)  (!) 97.4 F (36.3 C) 97.6 F (36.4 C)  TempSrc: Oral  Oral Oral  SpO2: 100%  100% 99%  Weight:  57.6 kg    Height:        Intake/Output Summary (Last 24 hours) at 02/09/2021 1519 Last data filed at 02/09/2021 1336 Gross per 24 hour  Intake 1122.11 ml  Output 1125 ml  Net -2.89 ml   Filed Weights   02/07/21 2308 02/08/21 0444 02/09/21 0500  Weight: 56.6 kg 60.3 kg 57.6 kg    Examination:  General exam: Appears calm and comfortable  Respiratory system: A few wheezes. Respiratory effort normal. Cardiovascular system: S1 & S2 heard, RRR. No JVD, murmurs, rubs, gallops or clicks. No pedal edema. Gastrointestinal system: Abdomen is nondistended, soft and nontender. No organomegaly or masses felt. Normal bowel sounds heard. Central nervous system: Alert and oriented. No focal neurological deficits. Extremities: Symmetric 5 x 5  power. Skin: No rashes, lesions or ulcers Psychiatry: Judgement and insight appear normal. Mood & affect appropriate.     Data Reviewed: I have personally reviewed following labs and imaging studies  CBC: Recent Labs  Lab 02/06/21 0503 02/08/21 0516 02/09/21 0529  WBC 8.6 12.2* 9.7  NEUTROABS 6.0 10.7* 7.6  HGB 9.0* 7.7* 7.7*  HCT 27.0* 22.7* 23.3*  MCV 83.9 81.7 82.6  PLT 237 228 664   Basic Metabolic Panel: Recent Labs  Lab 02/06/21 0503 02/06/21 1147 02/07/21 0651 02/08/21 0516 02/09/21 0529  NA 136  --  134* 135 135  K 5.4*  --  5.2* 4.4 4.1  CL 110  --  107 104 101  CO2 18*  --  17* 23 26  GLUCOSE 133*  --  144* 176* 161*  BUN 30*  --  43* 50* 54*  CREATININE 2.74*  --  3.19* 3.10* 2.92*  CALCIUM 8.7*  --  8.5* 8.0* 7.8*  MG  --   --   --  2.2 2.1  PHOS  --  5.2*  --   --   --    GFR: Estimated Creatinine Clearance: 11.7 mL/min (A) (by C-G formula based on SCr of 2.92 mg/dL (H)). Liver Function Tests: No results for input(s): AST, ALT, ALKPHOS, BILITOT, PROT, ALBUMIN in the last 168 hours. No results for input(s): LIPASE, AMYLASE in the last 168 hours. No results for input(s): AMMONIA in the last 168 hours. Coagulation Profile: Recent Labs  Lab 02/06/21 1308 02/08/21 0516 02/09/21 0740  INR 1.6* 1.7* 1.7*   Cardiac Enzymes: No results for input(s): CKTOTAL, CKMB, CKMBINDEX, TROPONINI in the last 168 hours. BNP (last 3 results) No results for input(s): PROBNP in the last 8760 hours. HbA1C: No results for input(s): HGBA1C in the last 72 hours. CBG: Recent Labs  Lab 02/08/21 0800 02/08/21 1150 02/08/21 2352 02/09/21 0828 02/09/21 1236  GLUCAP 166* 139* 207* 146* 196*   Lipid Profile: No results for input(s): CHOL, HDL, LDLCALC, TRIG, CHOLHDL, LDLDIRECT in the last 72 hours. Thyroid Function Tests: No results for input(s): TSH, T4TOTAL, FREET4, T3FREE, THYROIDAB in the last 72 hours. Anemia Panel: No results for input(s): VITAMINB12, FOLATE,  FERRITIN, TIBC, IRON, RETICCTPCT in the last 72 hours. Sepsis Labs: Recent Labs  Lab 02/08/21 0516  PROCALCITON 0.10    Recent Results (from the past 240 hour(s))  Resp Panel by RT-PCR (Flu A&B, Covid) Nasopharyngeal Swab     Status: None   Collection Time: 02/06/21  5:03 AM   Specimen: Nasopharyngeal Swab; Nasopharyngeal(NP) swabs in vial transport medium  Result Value Ref Range Status   SARS Coronavirus 2 by RT PCR NEGATIVE NEGATIVE Final    Comment: (NOTE) SARS-CoV-2 target nucleic acids are NOT DETECTED.  The SARS-CoV-2 RNA is generally detectable in upper respiratory specimens during the acute phase of infection. The lowest concentration of SARS-CoV-2 viral copies this assay can detect is 138 copies/mL. A negative result does not preclude SARS-Cov-2 infection and should not be used as the sole basis for treatment or other patient management decisions. A negative result may occur with  improper specimen collection/handling, submission of specimen other than nasopharyngeal  swab, presence of viral mutation(s) within the areas targeted by this assay, and inadequate number of viral copies(<138 copies/mL). A negative result must be combined with clinical observations, patient history, and epidemiological information. The expected result is Negative.  Fact Sheet for Patients:  EntrepreneurPulse.com.au  Fact Sheet for Healthcare Providers:  IncredibleEmployment.be  This test is no t yet approved or cleared by the [place] FDA and  has been authorized for detection and/or diagnosis of SARS-CoV-2 by FDA under an Emergency Use Authorization (EUA). This EUA will remain  in effect (meaning this test can be used) for the duration of the COVID-19 declaration under Section 564(b)(1) of the Act, 21 U.S.C.section 360bbb-3(b)(1), unless the authorization is terminated  or revoked sooner.       Influenza A by PCR NEGATIVE NEGATIVE Final    Influenza B by PCR NEGATIVE NEGATIVE Final    Comment: (NOTE) The Xpert Xpress SARS-CoV-2/FLU/RSV plus assay is intended as an aid in the diagnosis of influenza from Nasopharyngeal swab specimens and should not be used as a sole basis for treatment. Nasal washings and aspirates are unacceptable for Xpert Xpress SARS-CoV-2/FLU/RSV testing.  Fact Sheet for Patients: EntrepreneurPulse.com.au  Fact Sheet for Healthcare Providers: IncredibleEmployment.be  This test is not yet approved or cleared by the [place] FDA and has been authorized for detection and/or diagnosis of SARS-CoV-2 by FDA under an Emergency Use Authorization (EUA). This EUA will remain in effect (meaning this test can be used) for the duration of the COVID-19 declaration under Section 564(b)(1) of the Act, 21 U.S.C. section 360bbb-3(b)(1), unless the authorization is terminated or revoked.  Performed at [facility], [place], [place] [address]   Respiratory (~20 pathogens) panel by PCR     Status: Abnormal   Collection Time: 02/06/21  9:57 AM   Specimen: Nasopharyngeal Swab; Respiratory  Result Value Ref Range Status   Adenovirus NOT DETECTED NOT DETECTED Final   Coronavirus 229E NOT DETECTED NOT DETECTED Final    Comment: (NOTE) The Coronavirus on the Respiratory Panel, DOES NOT test for the novel  Coronavirus (2019 nCoV)    Coronavirus HKU1 NOT DETECTED NOT DETECTED Final   Coronavirus NL63 NOT DETECTED NOT DETECTED Final   Coronavirus OC43 NOT DETECTED NOT DETECTED Final   Metapneumovirus NOT DETECTED NOT DETECTED Final   Rhinovirus / Enterovirus DETECTED (A) NOT DETECTED Final   Influenza A NOT DETECTED NOT DETECTED Final   Influenza B NOT DETECTED NOT DETECTED Final   Parainfluenza Virus 1 NOT DETECTED NOT DETECTED Final   Parainfluenza Virus 2 NOT DETECTED NOT DETECTED Final   Parainfluenza Virus 3 NOT DETECTED NOT DETECTED Final    Parainfluenza Virus 4 NOT DETECTED NOT DETECTED Final   Respiratory Syncytial Virus NOT DETECTED NOT DETECTED Final   Bordetella pertussis NOT DETECTED NOT DETECTED Final   Bordetella Parapertussis NOT DETECTED NOT DETECTED Final   Chlamydophila pneumoniae NOT DETECTED NOT DETECTED Final   Mycoplasma pneumoniae NOT DETECTED NOT DETECTED Final    Comment: Performed at [facility], [address], [place], [place] [address]         Radiology Studies: No results found.      Scheduled Meds: . atorvastatin  10 mg Oral QPM  . citalopram  40 mg Oral Daily  . donepezil  10 mg Oral QHS  . enoxaparin (LOVENOX) injection  30 mg Subcutaneous Q24H  . feeding supplement  237 mL Oral BID BM  . ferrous sulfate  325 mg Oral  Q breakfast  . fluticasone  1 spray Each Nare Daily  . insulin aspart  0-9 Units Subcutaneous TID WC  . mouth rinse  15 mL Mouth Rinse BID  . multivitamin with minerals  1 tablet Oral Daily  . nicotine  14 mg Transdermal Daily  . pantoprazole  40 mg Oral BID  . polyethylene glycol  17 g Oral Daily  . predniSONE  40 mg Oral Q breakfast  . rOPINIRole  0.5 mg Oral QPM  . sodium chloride flush  3 mL Intravenous Q12H  . cyanocobalamin  1,000 mcg Oral Daily  . warfarin  2 mg Oral q1600  . Warfarin - Physician Dosing Inpatient   Does not apply q1600   Continuous Infusions: . sodium chloride       LOS: 3 days    Time spent: 28 minutes    Sharen Hones, MD [facility]   To contact the attending provider between 7A-7P or the covering provider during after hours 7P-7A, please log into the web site [email or web address] and access using universal [place] password for that web site. If you do not have the password, please call the hospital operator.  02/09/2021, 3:19 PM

## 2021-02-09 NOTE — Progress Notes (Signed)
Mobility Specialist - Progress Note   02/09/21 1551  Mobility  Activity Refused mobility  Mobility performed by Mobility specialist    Pt sleeping soundly on arrival. Did not arouse to voice/gentle touch. Will attempt session at another date/time as appropriate.    Kathee Delton Mobility Specialist 02/09/21, 3:52 PM

## 2021-02-10 DIAGNOSIS — J129 Viral pneumonia, unspecified: Secondary | ICD-10-CM

## 2021-02-10 LAB — BASIC METABOLIC PANEL
Anion gap: 9 (ref 5–15)
BUN: 55 mg/dL — ABNORMAL HIGH (ref 8–23)
CO2: 26 mmol/L (ref 22–32)
Calcium: 8.1 mg/dL — ABNORMAL LOW (ref 8.9–10.3)
Chloride: 101 mmol/L (ref 98–111)
Creatinine, Ser: 2.63 mg/dL — ABNORMAL HIGH (ref 0.44–1.00)
GFR, Estimated: 18 mL/min — ABNORMAL LOW (ref >60)
Glucose, Bld: 149 mg/dL — ABNORMAL HIGH (ref 70–99)
Potassium: 4.5 mmol/L (ref 3.5–5.1)
Sodium: 136 mmol/L (ref 135–145)

## 2021-02-10 LAB — CBC
HCT: 25.1 % — ABNORMAL LOW (ref 36.0–46.0)
Hemoglobin: 8.3 g/dL — ABNORMAL LOW (ref 12.0–15.0)
MCH: 27.2 pg (ref 26.0–34.0)
MCHC: 33.1 g/dL (ref 30.0–36.0)
MCV: 82.3 fL (ref 80.0–100.0)
Platelets: 201 10*3/uL (ref 150–400)
RBC: 3.05 MIL/uL — ABNORMAL LOW (ref 3.87–5.11)
RDW: 22.5 % — ABNORMAL HIGH (ref 11.5–15.5)
WBC: 8.7 10*3/uL (ref 4.0–10.5)
nRBC: 0 % (ref 0.0–0.2)

## 2021-02-10 LAB — GLUCOSE, CAPILLARY
Glucose-Capillary: 149 mg/dL — ABNORMAL HIGH (ref 70–99)
Glucose-Capillary: 265 mg/dL — ABNORMAL HIGH (ref 70–99)
Glucose-Capillary: 201 mg/dL — ABNORMAL HIGH (ref 70–99)

## 2021-02-10 LAB — PROTIME-INR
INR: 1.7 — ABNORMAL HIGH (ref 0.8–1.2)
Prothrombin Time: 20.3 seconds — ABNORMAL HIGH (ref 11.4–15.2)

## 2021-02-10 LAB — MAGNESIUM: Magnesium: 2.1 mg/dL (ref 1.7–2.4)

## 2021-02-10 MED ORDER — PREDNISONE 10 MG PO TABS: ORAL_TABLET | ORAL | 0 refills | Status: AC

## 2021-02-10 MED ORDER — LACTULOSE 10 GM/15ML PO SOLN
20.0000 g | Freq: Once | ORAL | Status: AC
  Administered 2021-02-10: 20 g via ORAL
  Filled 2021-02-10: qty 30

## 2021-02-10 NOTE — Progress Notes (Signed)
SATURATION QUALIFICATIONS: (This note is used to comply with regulatory documentation for home oxygen)  Patient Saturations on Room Air at Rest = 100%  Patient Saturations on Room Air while Ambulating = 83%  Patient Saturations on 2 Liters of oxygen while Ambulating = 91%  Please briefly explain why patient needs home oxygen:

## 2021-02-10 NOTE — Discharge Summary (Addendum)
Physician Discharge Summary  Patient ID: [name] MRN: [id number] DOB/AGE: 04/12/1941 80 y.o.  Admit date: 02/06/2021 Discharge date: 02/10/2021  Admission Diagnoses:  Discharge Diagnoses:  Principal Problem:   Acute on chronic diastolic CHF (congestive heart failure) ([place]) Active Problems:   Atrial fibrillation ([place])   CKD stage 4 due to type 2 diabetes mellitus (HCC)   Nicotine dependence   History of anemia due to chronic kidney disease   Hyperkalemia   Acute renal failure superimposed on stage 4 chronic kidney disease (HCC)   Anemia associated with stage 4 chronic renal failure [facility])   Discharged Condition: [name]  Hospital Course:  Amy A Simpsonis a 80 y.o.femalewith medical history significant forchronic diastolic dysfunction CHF, diabetes mellitus with chronic kidney disease, atrial fibrillation, nicotine dependence and hypertension who presents to the ER for evaluation of shortness of breath, coughproductive of clear phlegm, nasal congestion and wheezing. In the emergency room, her chest x-ray showed chronic interstitial changes with possible superimposed pulmonary edema. CT chest showed bilateral moderate pleural effusion,some groundglass changes with bilateral pleural effusion.  #1. Acute on chronic diastolic congestive heart failure. Chronic atrial fibrillation with rapid ventricular response. Acute hypoxemic respite failure.  POA. She was initially requiring oxygen, was able to wean off last night.  Condition had improved.  Currently euvolemic. Continue warfarin. Please note: digoxin is discontinued due to renal failure.   2. Acute renal failure on chronic kidney disease stage IV. Hyperkalemia Metabolic acidosis. Patient also improved today.  Renal function is better.  She received gentle rehydration.  #3. Interstitial lung disease. Bilateral pleural effusion. Rhinovirus viral pneumonia. Patient has been requiring oxygen, but condition had  improved.  Off oxygen today.  She had a significant bronchospasm, she received a steroids, condition improved.  Continue few days of oral steroid taper.  4.  Type 2 diabetes with hyperglycemia Continue home regimen, discontinued glimepiride due to concern of hypoglycemia in the elderly.  5. Anemia ofchronic kidney disease. Iron deficient anemia. Received IV iron, will continue oral supplement.  #6 essential hypertension. Patient blood pressure has not been high in the hospital, discontinued Norvasc, continued lisinopril.   Consults: nephrology  Significant Diagnostic Studies: RENAL / URINARY TRACT ULTRASOUND COMPLETE  COMPARISON:  None.  FINDINGS: Right Kidney:  Surgically absent.  The renal fossa is unremarkable.  Left Kidney:  Renal measurements: 10.2 x 5.7 x 4.9 cm = volume: 150 mL. Echogenicity within normal limits. No mass or hydronephrosis visualized.  Bladder:  Appears normal for degree of bladder distention. Left jet visualized.  Other:  Incidentally noted right pleural effusion.  IMPRESSION: 1.  Right kidney is surgically absent.  2.  Unremarkable sonographic appearance of the left kidney.  3.  Incidentally noted right pleural effusion.   Electronically Signed   By: Audie Pinto M.D.   On: 02/06/2021 12:58  CT CHEST WITHOUT CONTRAST  TECHNIQUE: Multidetector CT imaging of the chest was performed following the standard protocol without IV contrast.  COMPARISON:  Chest x-ray from earlier same day. Chest CT dated 01/03/2019.  FINDINGS: Cardiovascular: Borderline cardiomegaly. Aortic atherosclerosis. No thoracic aortic aneurysm. No significant pericardial effusion. Coronary artery calcifications, particularly dense within the LEFT anterior descending coronary artery.  Mediastinum/Nodes: Moderately enlarged lymph nodes within the mediastinum, including a 1.3 cm short axis lymph node within the pre-vascular space and  a 1.2 cm short axis lymph node within the precarinal space. These lymph nodes are not significantly changed compared to the previous study suggesting benign/reactive lymphadenopathy. No new enlarged or  morphologically abnormal lymph nodes.  Esophagus is unremarkable.  Trachea is unremarkable.  Lungs/Pleura: New bilateral pleural effusions, moderate-sized, with adjacent compressive atelectasis. Additional areas of subpleural reticulation and ground-glass opacities within the upper lobes, most compatible with chronic interstitial lung disease/fibrosis. No evidence of a superimposed consolidating pneumonia.  Upper Abdomen: Limited images of the upper abdomen are unremarkable.  Musculoskeletal: Degenerative spondylosis of the kyphotic thoracic spine, mild to moderate in degree. No acute appearing osseous abnormality. Ill-defined fluid/edema within the subcutaneous soft tissues of the lower chest and upper abdomen indicating some degree of anasarca.  IMPRESSION: 1. New bilateral pleural effusions, moderate-sized, with adjacent compressive atelectasis. 2. Additional areas of subpleural reticulation and ground-glass opacities within the bilateral upper lobes, most compatible with NSIP, hypersensitivity pneumonitis and/or respiratory bronchiolitis. No evidence of a superimposed consolidating pneumonia. 3. Coronary artery calcifications, particularly dense within the LEFT anterior descending coronary artery. Recommend correlation with any possible associated cardiac symptoms. 4. Anasarca.  Aortic Atherosclerosis (ICD10-I70.0).   Electronically Signed   By: Franki Cabot M.D.   On: 02/06/2021 09:18    Treatments: steroids, symptomatic treatment  Discharge Exam: Blood pressure (!) 126/41, pulse (!) 56, temperature 97.6 F (36.4 C), temperature source Oral, resp. rate 18, height '4\' 10"'  (1.473 m), weight 57.2 kg, SpO2 100 %. General appearance: alert and cooperative Resp:  clear to auscultation bilaterally Cardio: regular rate and rhythm, S1, S2 normal, no murmur, click, rub or gallop GI: soft, non-tender; bowel sounds normal; no masses,  no organomegaly Extremities: extremities normal, atraumatic, no cyanosis or edema  Disposition: Discharge disposition: 01-Home or Self Care       Discharge Instructions    Diet - low sodium heart healthy   Complete by: As directed    Increase activity slowly   Complete by: As directed      Allergies as of 02/10/2021   No Known Allergies     Medication List    STOP taking these medications   amLODipine 10 MG tablet Commonly known as: NORVASC   glimepiride 2 MG tablet Commonly known as: AMARYL     TAKE these medications   atorvastatin 10 MG tablet Commonly known as: LIPITOR Take 1 tablet (10 mg total) by mouth every evening.   Blood Pressure Monitor Kit Use three times daily to check blood pressure  DX:  I50.42   citalopram 40 MG tablet Commonly known as: CELEXA Take 1 tablet (40 mg total) by mouth daily.   cyanocobalamin 1000 MCG tablet Take 1 tablet (1,000 mcg total) by mouth daily.   digoxin 0.125 MG tablet Commonly known as: LANOXIN Take 1 tablet (0.125 mg total) by mouth daily.   donepezil 5 MG tablet Commonly known as: ARICEPT TAKE 1 TABLET BY MOUTH ONCE DAILY FOR MEMORY What changed:   how much to take  how to take this  when to take this   Dulcolax 5 MG EC tablet Generic drug: bisacodyl Take 1 tablet (5 mg total) by mouth daily as needed for moderate constipation.   ferrous sulfate 325 (65 FE) MG tablet Take 1 tablet (325 mg total) by mouth daily with breakfast.   fluticasone 50 MCG/ACT nasal spray Commonly known as: FLONASE Place 1 spray into both nostrils daily.   hydrALAZINE 50 MG tablet Commonly known as: APRESOLINE Take 1 tablet by mouth twice a day, may take an extra 50 mg if Bp is elevated.  Hold if SBP less than 130 mmHg What changed:   how much to take  how  to take this  when to take this  additional instructions   lisinopril 20 MG tablet Commonly known as: ZESTRIL Take 1 tablet (20 mg total) by mouth daily.   melatonin 3 MG Tabs tablet Take 3 mg by mouth at bedtime as needed (sleep).   pantoprazole 40 MG tablet Commonly known as: PROTONIX Take 1 tablet (40 mg total) by mouth 2 (two) times daily.   polyethylene glycol 17 g packet Commonly known as: MiraLax Take 17 g by mouth daily.   predniSONE 10 MG tablet Commonly known as: DELTASONE Take 4 tablets (40 mg total) by mouth daily for 2 days, THEN 2 tablets (20 mg total) daily for 2 days, THEN 1 tablet (10 mg total) daily for 2 days. Start taking on: February 10, 2021   promethazine 12.5 MG tablet Commonly known as: PHENERGAN Take 1 tablet (12.5 mg total) by mouth every 8 (eight) hours as needed for nausea or vomiting.   rOPINIRole 0.5 MG tablet Commonly known as: REQUIP Take 1 tablet (0.5 mg total) by mouth every evening.   Toujeo Max SoloStar 300 UNIT/ML Solostar Pen Generic drug: insulin glargine (2 Unit Dial) Take 6 to 10 units with supper( needs needles as well e 11.65   warfarin 2 MG tablet Commonly known as: COUMADIN Take 1 tablet (2 mg total) by mouth daily.       Follow-up Information    Lavera Guise, MD Follow up in 1 week(s).   Specialty: Internal Medicine Contact information: [place] [address] [phone]              35 minutes Signed: Sharen Hones 02/10/2021, 10:05 AM

## 2021-02-10 NOTE — Progress Notes (Signed)
Patient Name: [name] Date of Encounter: 02/10/2021  Hospital Problem List     Principal Problem:   Acute on chronic diastolic CHF (congestive heart failure) (HCC) Active Problems:   Atrial fibrillation ([place])   CKD stage 4 due to type 2 diabetes mellitus (HCC)   Nicotine dependence   History of anemia due to chronic kidney disease   Hyperkalemia   Acute renal failure superimposed on stage 4 chronic kidney disease (HCC)   Anemia associated with stage 4 chronic renal failure [facility])    Patient Profile     80 y.o. female with history of chronic diastolic heart failure, diabetes, atrial fibrillation, tobacco abuse, hypertension who presented to emergency room yesterday morning with complaints of shortness of breath.  She had no burning fever or chills but had orthopnea.  Pulse ox on room air was 90 to 91%.  Laboratories showed hemoglobin of 9.0 BNP of 233.7 with a normal troponin of 12 and 11.  Chest x-ray showed interstitial opacity consistent with chronic lung disease.  Was felt to that the pulmonary edema would be obscured.  Chest CT revealed small to medium mild lateral pleural effusions.  EKG showed atrial fibrillation with right axis deviation.  Patient was given Lasix in the emergency room.  As an outpatient she was on warfarin, amlodipine, atorvastatin, digoxin 0.125 mg daily, hydralazine twice daily, lisinopril 20 daily.  Most recent echocardiogram done on 1 month ago showed preserved LV function with an EF of 60 to 65% with mild aortic stenosis, left pleural effusion.  Urgent cardiac consult ordered today via secure messaging for A. fib with RVR.  Telemetry reveals atrial fibrillation with controlled ventricular response.  EKG done yesterday showed atrial fibrillation with a rate of 65.  Patient is currently not on any rate related medications.  Subjective   Somewhat lethargic.  Inpatient Medications    . atorvastatin  10 mg Oral QPM  . citalopram  40 mg Oral Daily  .  donepezil  10 mg Oral QHS  . enoxaparin (LOVENOX) injection  30 mg Subcutaneous Q24H  . feeding supplement  237 mL Oral BID BM  . ferrous sulfate  325 mg Oral Q breakfast  . fluticasone  1 spray Each Nare Daily  . insulin aspart  0-9 Units Subcutaneous TID WC  . mouth rinse  15 mL Mouth Rinse BID  . multivitamin with minerals  1 tablet Oral Daily  . nicotine  14 mg Transdermal Daily  . pantoprazole  40 mg Oral BID  . polyethylene glycol  17 g Oral Daily  . predniSONE  40 mg Oral Q breakfast  . rOPINIRole  0.5 mg Oral QPM  . sodium chloride flush  3 mL Intravenous Q12H  . cyanocobalamin  1,000 mcg Oral Daily  . warfarin  2 mg Oral q1600  . Warfarin - Physician Dosing Inpatient   Does not apply q1600    Vital Signs    Vitals:   02/10/21 0751 02/10/21 0957 02/10/21 1213 02/10/21 1622  BP: (!) 126/41  (!) 122/49 (!) 127/50  Pulse: (!) 55 (!) 56 64 (!) 51  Resp: 18  18 18   Temp: 97.6 F (36.4 C)  97.6 F (36.4 C) 97.8 F (36.6 C)  TempSrc: Oral  Oral Oral  SpO2: 100% 100% 100% 100%  Weight:      Height:        Intake/Output Summary (Last 24 hours) at 02/10/2021 1732 Last data filed at 02/10/2021 1346 Gross per 24 hour  Intake 960 ml  Output 1000 ml  Net -40 ml   Filed Weights   02/08/21 0444 02/09/21 0500 02/10/21 0408  Weight: 60.3 kg 57.6 kg 57.2 kg    Physical Exam    GEN: Well nourished, well developed, in no acute distress.  HEENT: normal.  Neck: Supple, no JVD, carotid bruits, or masses. Cardiac: RRR, no murmurs, rubs, or gallops. No clubbing, cyanosis, edema.  Radials/DP/PT 2+ and equal bilaterally.  Respiratory:  Respirations regular and unlabored, clear to auscultation bilaterally. GI: Soft, nontender, nondistended, BS + x 4. MS: no deformity or atrophy. Skin: warm and dry, no rash. Neuro:  Strength and sensation are intact. Psych: Normal affect.  Labs    CBC Recent Labs    02/08/21 0516 02/09/21 0529 02/10/21 0454  WBC 12.2* 9.7 8.7  NEUTROABS  10.7* 7.6  --   HGB 7.7* 7.7* 8.3*  HCT 22.7* 23.3* 25.1*  MCV 81.7 82.6 82.3  PLT 228 191 301   Basic Metabolic Panel Recent Labs    02/09/21 0529 02/10/21 0454  NA 135 136  K 4.1 4.5  CL 101 101  CO2 26 26  GLUCOSE 161* 149*  BUN 54* 55*  CREATININE 2.92* 2.63*  CALCIUM 7.8* 8.1*  MG 2.1 2.1   Liver Function Tests No results for input(s): AST, ALT, ALKPHOS, BILITOT, PROT, ALBUMIN in the last 72 hours. No results for input(s): LIPASE, AMYLASE in the last 72 hours. Cardiac Enzymes No results for input(s): CKTOTAL, CKMB, CKMBINDEX, TROPONINI in the last 72 hours. BNP No results for input(s): BNP in the last 72 hours. D-Dimer No results for input(s): DDIMER in the last 72 hours. Hemoglobin A1C No results for input(s): HGBA1C in the last 72 hours. Fasting Lipid Panel No results for input(s): CHOL, HDL, LDLCALC, TRIG, CHOLHDL, LDLDIRECT in the last 72 hours. Thyroid Function Tests No results for input(s): TSH, T4TOTAL, T3FREE, THYROIDAB in the last 72 hours.  Invalid input(s): FREET3  Telemetry    A. fib with controlled ventricular response  ECG    A. fib with controlled ventricular response  Radiology    CT CHEST WO CONTRAST  Result Date: 02/06/2021 CLINICAL DATA:  Shortness of breath, congestion.  History of COPD. EXAM: CT CHEST WITHOUT CONTRAST TECHNIQUE: Multidetector CT imaging of the chest was performed following the standard protocol without IV contrast. COMPARISON:  Chest x-ray from earlier same day. Chest CT dated 01/03/2019. FINDINGS: Cardiovascular: Borderline cardiomegaly. Aortic atherosclerosis. No thoracic aortic aneurysm. No significant pericardial effusion. Coronary artery calcifications, particularly dense within the LEFT anterior descending coronary artery. Mediastinum/Nodes: Moderately enlarged lymph nodes within the mediastinum, including a 1.3 cm short axis lymph node within the pre-vascular space and a 1.2 cm short axis lymph node within the  precarinal space. These lymph nodes are not significantly changed compared to the previous study suggesting benign/reactive lymphadenopathy. No new enlarged or morphologically abnormal lymph nodes. Esophagus is unremarkable.  Trachea is unremarkable. Lungs/Pleura: New bilateral pleural effusions, moderate-sized, with adjacent compressive atelectasis. Additional areas of subpleural reticulation and ground-glass opacities within the upper lobes, most compatible with chronic interstitial lung disease/fibrosis. No evidence of a superimposed consolidating pneumonia. Upper Abdomen: Limited images of the upper abdomen are unremarkable. Musculoskeletal: Degenerative spondylosis of the kyphotic thoracic spine, mild to moderate in degree. No acute appearing osseous abnormality. Ill-defined fluid/edema within the subcutaneous soft tissues of the lower chest and upper abdomen indicating some degree of anasarca. IMPRESSION: 1. New bilateral pleural effusions, moderate-sized, with adjacent compressive atelectasis. 2. Additional areas  of subpleural reticulation and ground-glass opacities within the bilateral upper lobes, most compatible with NSIP, hypersensitivity pneumonitis and/or respiratory bronchiolitis. No evidence of a superimposed consolidating pneumonia. 3. Coronary artery calcifications, particularly dense within the LEFT anterior descending coronary artery. Recommend correlation with any possible associated cardiac symptoms. 4. Anasarca. Aortic Atherosclerosis (ICD10-I70.0). Electronically Signed   By: Franki Cabot M.D.   On: 02/06/2021 09:18   US RENAL  Result Date: 02/06/2021 CLINICAL DATA:  Acute kidney failure.  History of right nephrectomy. EXAM: RENAL / URINARY TRACT ULTRASOUND COMPLETE COMPARISON:  None. FINDINGS: Right Kidney: Surgically absent.  The renal fossa is unremarkable. Left Kidney: Renal measurements: 10.2 x 5.7 x 4.9 cm = volume: 150 mL. Echogenicity within normal limits. No mass or  hydronephrosis visualized. Bladder: Appears normal for degree of bladder distention. Left jet visualized. Other: Incidentally noted right pleural effusion. IMPRESSION: 1.  Right kidney is surgically absent. 2.  Unremarkable sonographic appearance of the left kidney. 3.  Incidentally noted right pleural effusion. Electronically Signed   By: Audie Pinto M.D.   On: 02/06/2021 12:58   DG Chest Portable 1 View  Result Date: 02/06/2021 CLINICAL DATA:  Shortness of breath and nasal congestion with wheezing EXAM: PORTABLE CHEST 1 VIEW COMPARISON:  12/23/2020 FINDINGS: Generalized interstitial prominence. There is chronic lung disease by prior imaging. Borderline heart size with stable mediastinal contours. Blunting of the costophrenic sulci similar to prior. No pneumothorax. IMPRESSION: Interstitial opacity correlating with chronic lung disease on prior imaging. Superimposed pulmonary edema or atypical infection would easily be obscured. Electronically Signed   By: Monte Fantasia M.D.   On: 02/06/2021 05:30    Assessment & Plan    80 yo female who had diastolic chf who presented with increasing sob. CXR suggested volume overload. EKG showed afib with controlled vr. Afib rate is still in control  CHF-echo read last month showed preserved lv funciton. Will continue with careful diuresis and follow clinical response, hemodynamics and electrolytes.   afib-rate is well controlled. Telemetry has artifact but no sustained rvr. Continue to follow. Is on warfarin. INR goal of 2-3.   Signed, Javier Docker Shanieka Blea MD 02/10/2021, 5:32 PM  Pager: [phone]

## 2021-02-10 NOTE — TOC Transition Note (Signed)
Transition of Care [facility]) - CM/SW Discharge Note   Patient Details  Name: [name] MRN: [id number] Date of Birth: 1940-12-06  [facility]) CM/SW Contact:  Kerin Salen, RN Phone Number: 02/10/2021, 12:07 PM   Clinical Narrative: Patient to be discharged home today, daughter June will transport. Patient given Scale for home use, education of use and documentation done by Heart Nurse. [facility] notified will provide HHPT. TOC needs resolved.      Final next level of care: [place] Barriers to Discharge: Continued Medical Work up   Patient Goals and CMS Choice Patient states their goals for this hospitalization and ongoing recovery are:: To return home with daughter.   Choice offered to / list presented to : NA  Discharge Placement                       Discharge Plan and Services   Discharge Planning Services: CM Consult Post Acute Care Choice: NA          DME Arranged: N/A DME Agency: NA       HH Arranged: PT,RN [place]: [place] ([place]) Date [place]: 02/08/21 Time [place]: 1253 Representative spoke with at [place]: [place] (SDOH) Interventions     Readmission Risk Interventions No flowsheet data found.

## 2021-02-10 NOTE — Progress Notes (Signed)
SLP Cancellation Note  Patient Details Name: DESHAYLA EMPSON MRN: [id number] DOB: 1940/12/01   Cancelled treatment:        Reviewed chart notes; consulted NSG re: pt's status today. Pt is tolerating her oral diet and swallowing Pills w/ puree per NSG w/ no overt s/s of aspiration noted by NSG. Recommend continue current diet w/ general aspiration precautions as posted/left in room. NSG to reconsult ST services if any new needs while admitted. MD updated as well.     Orinda Kenner, MS, CCC-SLP Speech Language Pathologist Rehab Services [phone] [facility] 02/10/2021, 3:02 PM

## 2021-02-10 NOTE — Progress Notes (Signed)
Physical Therapy Treatment Patient Details Name: [name] MRN: [id number] DOB: 03-26-1941 Today's Date: 02/10/2021    History of Present Illness Pt admitted for acute on chronic heart failure with complaints of SOB and nasal congestion. History includes DM, heart failure, CKD, Afib, HTN.    PT Comments    Pt was supine in bed with HOB elevated ~ 15 degrees. Agrees to PT session. Sao2 on rm air upon arriving 91%. She was able to exit L side of bed, stand to RW, and ambulate 125 ft. No LOB however pt did desaturate to 83%. Recommend home o2. She is eager to DC home and is agreeable to HHPT to follow. Will continue to benefit from skilled PT going forward to progress strength and activity tolerance. Pt has 24/7 assistance at home. She was in bed with bed alarm set and RN aware of O2 demand during activity.    Follow Up Recommendations  Home health PT;Supervision/Assistance - 24 hour     Equipment Recommendations  None recommended by PT       Precautions / Restrictions Precautions Precautions: Fall Restrictions Weight Bearing Restrictions: No    Mobility  Bed Mobility Overal bed mobility: Needs Assistance Bed Mobility: Supine to Sit     Supine to sit: Supervision Sit to supine: Min assist        Transfers Overall transfer level: Needs assistance Equipment used: Rolling walker (2 wheeled) Transfers: Sit to/from Stand Sit to Stand: Min guard            Ambulation/Gait Ambulation/Gait assistance: Min guard Gait Distance (Feet): 125 Feet Assistive device: Rolling walker (2 wheeled) Gait Pattern/deviations: Step-through pattern Gait velocity: decreased   General Gait Details: Pt ambulated 125 ft with RW. Gait distance limited only by desaturation on room air. desats to 83%. reapplied 2 L o2 when returned to room and pt quickly recovers to 93%     Balance Overall balance assessment: Needs assistance Sitting-balance support: Feet supported Sitting  balance-Leahy Scale: Good     Standing balance support: Bilateral upper extremity supported Standing balance-Leahy Scale: Good Standing balance comment: requires RW for support during dynamic activity       Cognition Arousal/Alertness: Awake/alert Behavior During Therapy: WFL for tasks assessed/performed Overall Cognitive Status: Within Functional Limits for tasks assessed               Pertinent Vitals/Pain Pain Assessment: No/denies pain           PT Goals (current goals can now be found in the care plan section) Acute Rehab PT Goals Patient Stated Goal: to go home Progress towards PT goals: Progressing toward goals    Frequency    Min 2X/week      PT Plan Current plan remains appropriate       AM-PAC PT "6 Clicks" Mobility   Outcome Measure  Help needed turning from your back to your side while in a flat bed without using bedrails?: A Little Help needed moving from lying on your back to sitting on the side of a flat bed without using bedrails?: A Little Help needed moving to and from a bed to a chair (including a wheelchair)?: A Little Help needed standing up from a chair using your arms (e.g., wheelchair or bedside chair)?: A Little Help needed to walk in hospital room?: A Little Help needed climbing 3-5 steps with a railing? : A Little 6 Click Score: 18    End of Session Equipment Utilized During Treatment: Gait belt;Oxygen (unsuccessful wean  of O2) Activity Tolerance: Patient tolerated treatment well;Patient limited by fatigue Patient left: in bed;with call bell/phone within reach;with bed alarm set Nurse Communication: Mobility status PT Visit Diagnosis: Unsteadiness on feet (R26.81);Muscle weakness (generalized) (M62.81);Difficulty in walking, not elsewhere classified (R26.2)     Time: 4098-1191 PT Time Calculation (min) (ACUTE ONLY): 30 min  Charges:  $Gait Training: 8-22 mins $Therapeutic Activity: 8-22 mins                     Julaine Fusi PTA 02/10/21, 11:38 AM

## 2021-02-10 NOTE — Progress Notes (Signed)
[facility]  ROUNDING NOTE   Subjective:   Patient seen resting in bed Alert and oriented Tolerating meals, denies nausea Currently on room air  Objective:  Vital signs in last 24 hours:  Temp:  [97.6 F (36.4 C)-98.1 F (36.7 C)] 97.6 F (36.4 C) (04/21 0751) Pulse Rate:  [43-62] 56 (04/21 0957) Resp:  [16-19] 18 (04/21 0751) BP: (115-126)/(35-44) 126/41 (04/21 0751) SpO2:  [97 %-100 %] 100 % (04/21 0957) Weight:  [57.2 kg] 57.2 kg (04/21 0408)  Weight change: -0.401 kg Filed Weights   02/08/21 0444 02/09/21 0500 02/10/21 0408  Weight: 60.3 kg 57.6 kg 57.2 kg    Intake/Output: I/O last 3 completed shifts: In: 1148.7 [P.O.:600; I.V.:548.7] Out: 1100 [Urine:1100]   Intake/Output this shift:  Total I/O In: 240 [P.O.:240] Out: 500 [Urine:500]  Physical Exam: General: NAD, laying in bed  Head: Normocephalic, atraumatic. Moist oral mucosal membranes  Eyes: Anicteric  Lungs:  +bilateral expiratory wheezing  Heart: Regular rate and rhythm  Abdomen:  Soft, nontender,   Extremities:  no peripheral edema.  Neurologic: Nonfocal, moving all four extremities  Skin: No lesions         Basic Metabolic Panel: Recent Labs  Lab 02/06/21 0503 02/06/21 1147 02/07/21 0651 02/08/21 0516 02/09/21 0529 02/10/21 0454  NA 136  --  134* 135 135 136  K 5.4*  --  5.2* 4.4 4.1 4.5  CL 110  --  107 104 101 101  CO2 18*  --  17* 23 26 26   GLUCOSE 133*  --  144* 176* 161* 149*  BUN 30*  --  43* 50* 54* 55*  CREATININE 2.74*  --  3.19* 3.10* 2.92* 2.63*  CALCIUM 8.7*  --  8.5* 8.0* 7.8* 8.1*  MG  --   --   --  2.2 2.1 2.1  PHOS  --  5.2*  --   --   --   --     Liver Function Tests: No results for input(s): AST, ALT, ALKPHOS, BILITOT, PROT, ALBUMIN in the last 168 hours. No results for input(s): LIPASE, AMYLASE in the last 168 hours. No results for input(s): AMMONIA in the last 168 hours.  CBC: Recent Labs  Lab 02/06/21 0503 02/08/21 0516 02/09/21 0529  02/10/21 0454  WBC 8.6 12.2* 9.7 8.7  NEUTROABS 6.0 10.7* 7.6  --   HGB 9.0* 7.7* 7.7* 8.3*  HCT 27.0* 22.7* 23.3* 25.1*  MCV 83.9 81.7 82.6 82.3  PLT 237 228 191 201    Cardiac Enzymes: No results for input(s): CKTOTAL, CKMB, CKMBINDEX, TROPONINI in the last 168 hours.  BNP: Invalid input(s): POCBNP  CBG: Recent Labs  Lab 02/09/21 0828 02/09/21 1236 02/09/21 1703 02/09/21 2023 02/10/21 0748  GLUCAP 146* 196* 252* 245* 149*    Microbiology: Results for orders placed or performed during the hospital encounter of 02/06/21  Resp Panel by RT-PCR (Flu A&B, Covid) Nasopharyngeal Swab     Status: None   Collection Time: 02/06/21  5:03 AM   Specimen: Nasopharyngeal Swab; Nasopharyngeal(NP) swabs in vial transport medium  Result Value Ref Range Status   SARS Coronavirus 2 by RT PCR NEGATIVE NEGATIVE Final    Comment: (NOTE) SARS-CoV-2 target nucleic acids are NOT DETECTED.  The SARS-CoV-2 RNA is generally detectable in upper respiratory specimens during the acute phase of infection. The lowest concentration of SARS-CoV-2 viral copies this assay can detect is 138 copies/mL. A negative result does not preclude SARS-Cov-2 infection and should not be used as the  sole basis for treatment or other patient management decisions. A negative result may occur with  improper specimen collection/handling, submission of specimen other than nasopharyngeal swab, presence of viral mutation(s) within the areas targeted by this assay, and inadequate number of viral copies(<138 copies/mL). A negative result must be combined with clinical observations, patient history, and epidemiological information. The expected result is Negative.  Fact Sheet for Patients:  EntrepreneurPulse.com.au  Fact Sheet for Healthcare Providers:  IncredibleEmployment.be  This test is no t yet approved or cleared by the [place] FDA and  has been authorized for detection  and/or diagnosis of SARS-CoV-2 by FDA under an Emergency Use Authorization (EUA). This EUA will remain  in effect (meaning this test can be used) for the duration of the COVID-19 declaration under Section 564(b)(1) of the Act, 21 U.S.C.section 360bbb-3(b)(1), unless the authorization is terminated  or revoked sooner.       Influenza A by PCR NEGATIVE NEGATIVE Final   Influenza B by PCR NEGATIVE NEGATIVE Final    Comment: (NOTE) The Xpert Xpress SARS-CoV-2/FLU/RSV plus assay is intended as an aid in the diagnosis of influenza from Nasopharyngeal swab specimens and should not be used as a sole basis for treatment. Nasal washings and aspirates are unacceptable for Xpert Xpress SARS-CoV-2/FLU/RSV testing.  Fact Sheet for Patients: EntrepreneurPulse.com.au  Fact Sheet for Healthcare Providers: IncredibleEmployment.be  This test is not yet approved or cleared by the [place] FDA and has been authorized for detection and/or diagnosis of SARS-CoV-2 by FDA under an Emergency Use Authorization (EUA). This EUA will remain in effect (meaning this test can be used) for the duration of the COVID-19 declaration under Section 564(b)(1) of the Act, 21 U.S.C. section 360bbb-3(b)(1), unless the authorization is terminated or revoked.  Performed at [facility], [place], [place] [address]   Respiratory (~20 pathogens) panel by PCR     Status: Abnormal   Collection Time: 02/06/21  9:57 AM   Specimen: Nasopharyngeal Swab; Respiratory  Result Value Ref Range Status   Adenovirus NOT DETECTED NOT DETECTED Final   Coronavirus 229E NOT DETECTED NOT DETECTED Final    Comment: (NOTE) The Coronavirus on the Respiratory Panel, DOES NOT test for the novel  Coronavirus (2019 nCoV)    Coronavirus HKU1 NOT DETECTED NOT DETECTED Final   Coronavirus NL63 NOT DETECTED NOT DETECTED Final   Coronavirus OC43 NOT DETECTED NOT DETECTED Final    Metapneumovirus NOT DETECTED NOT DETECTED Final   Rhinovirus / Enterovirus DETECTED (A) NOT DETECTED Final   Influenza A NOT DETECTED NOT DETECTED Final   Influenza B NOT DETECTED NOT DETECTED Final   Parainfluenza Virus 1 NOT DETECTED NOT DETECTED Final   Parainfluenza Virus 2 NOT DETECTED NOT DETECTED Final   Parainfluenza Virus 3 NOT DETECTED NOT DETECTED Final   Parainfluenza Virus 4 NOT DETECTED NOT DETECTED Final   Respiratory Syncytial Virus NOT DETECTED NOT DETECTED Final   Bordetella pertussis NOT DETECTED NOT DETECTED Final   Bordetella Parapertussis NOT DETECTED NOT DETECTED Final   Chlamydophila pneumoniae NOT DETECTED NOT DETECTED Final   Mycoplasma pneumoniae NOT DETECTED NOT DETECTED Final    Comment: Performed at [facility], [address], [place], [place] [address]    Coagulation Studies: Recent Labs    02/08/21 0516 02/09/21 0740 02/10/21 0454  LABPROT 19.7* 19.7* 20.3*  INR 1.7* 1.7* 1.7*    Urinalysis: No results for input(s): COLORURINE, LABSPEC, PHURINE, GLUCOSEU, HGBUR, BILIRUBINUR, KETONESUR, PROTEINUR, UROBILINOGEN, NITRITE, LEUKOCYTESUR in  the last 72 hours.  Invalid input(s): APPERANCEUR    Imaging: No results found.   Medications:   . sodium chloride     . atorvastatin  10 mg Oral QPM  . citalopram  40 mg Oral Daily  . donepezil  10 mg Oral QHS  . enoxaparin (LOVENOX) injection  30 mg Subcutaneous Q24H  . feeding supplement  237 mL Oral BID BM  . ferrous sulfate  325 mg Oral Q breakfast  . fluticasone  1 spray Each Nare Daily  . insulin aspart  0-9 Units Subcutaneous TID WC  . mouth rinse  15 mL Mouth Rinse BID  . multivitamin with minerals  1 tablet Oral Daily  . nicotine  14 mg Transdermal Daily  . pantoprazole  40 mg Oral BID  . polyethylene glycol  17 g Oral Daily  . predniSONE  40 mg Oral Q breakfast  . rOPINIRole  0.5 mg Oral QPM  . sodium chloride flush  3 mL Intravenous Q12H  . cyanocobalamin  1,000 mcg Oral Daily   . warfarin  2 mg Oral q1600  . Warfarin - Physician Dosing Inpatient   Does not apply q1600   sodium chloride, acetaminophen, bisacodyl, ipratropium-albuterol, melatonin, ondansetron (ZOFRAN) IV, sodium chloride flush  Assessment/ Plan:  [name] is a 80 y.o. white female with right nephrectomy, diastolic congestive heart failure hypertension, hyperlipidemia, GERD, insulin dependent diabetes mellitus type II, atrial fibrillation, anemia, depression, dementia who was admitted to [facility] on 02/06/2021 for Acute CHF (congestive heart failure) ([place]) [I50.9]  1. Acute kidney injury with hyperkalemia, hyponatremia and metabolic acidosis on chronic kidney disease stage IV with proteinuria: Baseline creatinine of  2.63, GFR of 18 on 01/20/2021.  Chronic kidney disease secondary to solitary kidney, diabetes and hypertension.  No recent IV contrast exposure. No obstruction on ultrasound.  - Hold lisinopril   - Continue IVF - Renal function continues to improve - Will follow up with nephrology after discharge  2. Hypertension and acute exacerbation of chronic diastolic congestive heart failure:  - hold diuretics.   3. Diabetes mellitus type II with chronic kidney disease: insulin dependent. Hemoglobin A1c of 6.9% on 12/24/20.  - Glucose stable  4. Anemia of chronic kidney disease: normocytic. Hemoglobin 8.3. Consistent with iron deficiency.  Will monitor and treat if needed   LOS: 4 [place] 4/21/202211:36 AM

## 2021-02-10 NOTE — Progress Notes (Signed)
[name] to be D/C'd Home per MD order.  Discussed prescriptions and follow up appointments with the patient and daughter. Prescriptions given to patient, medication list explained in detail. Pt verbalized understanding.  Allergies as of 02/10/2021   No Known Allergies     Medication List    STOP taking these medications   amLODipine 10 MG tablet Commonly known as: NORVASC   digoxin 0.125 MG tablet Commonly known as: LANOXIN   glimepiride 2 MG tablet Commonly known as: AMARYL     TAKE these medications   atorvastatin 10 MG tablet Commonly known as: LIPITOR Take 1 tablet (10 mg total) by mouth every evening.   Blood Pressure Monitor Kit Use three times daily to check blood pressure  DX:  I50.42   citalopram 40 MG tablet Commonly known as: CELEXA Take 1 tablet (40 mg total) by mouth daily.   cyanocobalamin 1000 MCG tablet Take 1 tablet (1,000 mcg total) by mouth daily.   donepezil 5 MG tablet Commonly known as: ARICEPT TAKE 1 TABLET BY MOUTH ONCE DAILY FOR MEMORY What changed:   how much to take  how to take this  when to take this   Dulcolax 5 MG EC tablet Generic drug: bisacodyl Take 1 tablet (5 mg total) by mouth daily as needed for moderate constipation.   ferrous sulfate 325 (65 FE) MG tablet Take 1 tablet (325 mg total) by mouth daily with breakfast.   fluticasone 50 MCG/ACT nasal spray Commonly known as: FLONASE Place 1 spray into both nostrils daily.   hydrALAZINE 50 MG tablet Commonly known as: APRESOLINE Take 1 tablet by mouth twice a day, may take an extra 50 mg if Bp is elevated.  Hold if SBP less than 130 mmHg What changed:   how much to take  how to take this  when to take this  additional instructions   lisinopril 20 MG tablet Commonly known as: ZESTRIL Take 1 tablet (20 mg total) by mouth daily.   melatonin 3 MG Tabs tablet Take 3 mg by mouth at bedtime as needed (sleep).   pantoprazole 40 MG tablet Commonly known as:  PROTONIX Take 1 tablet (40 mg total) by mouth 2 (two) times daily.   polyethylene glycol 17 g packet Commonly known as: MiraLax Take 17 g by mouth daily.   predniSONE 10 MG tablet Commonly known as: DELTASONE Take 4 tablets (40 mg total) by mouth daily for 2 days, THEN 2 tablets (20 mg total) daily for 2 days, THEN 1 tablet (10 mg total) daily for 2 days. Start taking on: February 10, 2021   promethazine 12.5 MG tablet Commonly known as: PHENERGAN Take 1 tablet (12.5 mg total) by mouth every 8 (eight) hours as needed for nausea or vomiting.   rOPINIRole 0.5 MG tablet Commonly known as: REQUIP Take 1 tablet (0.5 mg total) by mouth every evening.   Toujeo Max SoloStar 300 UNIT/ML Solostar Pen Generic drug: insulin glargine (2 Unit Dial) Take 6 to 10 units with supper( needs needles as well e 11.65   warfarin 2 MG tablet Commonly known as: COUMADIN Take 1 tablet (2 mg total) by mouth daily.            Durable Medical Equipment  (From admission, onward)         Start     Ordered   02/10/21 1618  For home use only DME oxygen  Once       Question Answer Comment  Length of Need  6 Months   Mode or (Route) Nasal cannula   Liters per Minute 2   Frequency Continuous (stationary and portable oxygen unit needed)   Oxygen conserving device Yes   Oxygen delivery system Gas      02/10/21 1617          Vitals:   02/10/21 1213 02/10/21 1622  BP: (!) 122/49 (!) 127/50  Pulse: 64 (!) 51  Resp: 18 18  Temp: 97.6 F (36.4 C) 97.8 F (36.6 C)  SpO2: 100% 100%    Tele box removed and returned. Skin clean, dry and intact without evidence of skin break down, no evidence of skin tears noted. IV catheter discontinued intact. Site without signs and symptoms of complications. Dressing and pressure applied. Pt denies pain at this time. No complaints noted.  An After Visit Summary was printed and given to the patient. Patient escorted via [place], and D/C home via private auto.  [name]

## 2021-02-10 NOTE — Discharge Instructions (Signed)
Chronic Kidney Disease, Adult Chronic kidney disease is when lasting damage happens to the kidneys slowly over a long time. The kidneys help to:  Make pee (urine).  Make hormones.  Keep the right amount of fluids and chemicals in the body. Most often, this disease does not go away. You must take steps to help keep the kidney damage from getting worse. If steps are not taken, the kidneys might stop working forever. What are the causes?  Diabetes.  High blood pressure.  Diseases that affect the heart and blood vessels.  Other kidney diseases.  Diseases of the body's disease-fighting system.  A problem with the flow of pee.  Infections of the organs that make pee, store it, and take it out of the body.  Swelling or irritation of your blood vessels. What increases the risk?  Getting older.  Having someone in your family who has kidney disease or kidney failure.  Having a disease caused by genes.  Taking medicines often that harm the kidneys.  Being near or having contact with harmful substances.  Being very overweight.  Using tobacco now or in the past. What are the signs or symptoms?  Feeling very tired.  Having a swollen face, legs, ankles, or feet.  Feeling like you may vomit or vomiting.  Not feeling hungry.  Being confused or not able to focus.  Twitches and cramps in the leg muscles or other muscles.  Dry, itchy skin.  A taste of metal in your mouth.  Making less pee, or making more pee.  Shortness of breath.  Trouble sleeping. You may also become anemic or get weak bones. Anemic means there is not enough red blood cells or hemoglobin in your blood. You may get symptoms slowly. You may not notice them until the kidney damage gets very bad. How is this treated? Often, there is no cure for this disease. Treatment can help with symptoms and help keep the disease from getting worse. You may need to:  Avoid alcohol.  Avoid foods that are high in  salt, potassium, phosphorous, and protein.  Take medicines for symptoms and to help control other conditions.  Have dialysis. This treatment gets harmful waste out of your body.  Treat other problems that cause your kidney disease or make it worse. Follow these instructions at home: Medicines  Take over-the-counter and prescription medicines only as told by your doctor.  Do not take any new medicines, vitamins, or supplements unless your doctor says it is okay. Lifestyle  Do not smoke or use any products that contain nicotine or tobacco. If you need help quitting, ask your doctor.  If you drink alcohol: ? Limit how much you use to:  0-1 drink a day for women who are not pregnant.  0-2 drinks a day for men. ? Know how much alcohol is in your drink. In the [place] one drink equals one 12 oz bottle of beer (355 mL), one 5 oz glass of wine (148 mL), or one 1 oz glass of hard liquor (44 mL).  Stay at a healthy weight. If you need help losing weight, ask your doctor.   General instructions  Follow instructions from your doctor about what you cannot eat or drink.  Track your blood pressure at home. Tell your doctor about any changes.  If you have diabetes, track your blood sugar.  Exercise at least 30 minutes a day, 5 days a week.  Keep your shots (vaccinations) up to date.  Keep all follow-up visits.     Where to find more information  American Association of Kidney Patients: www.aakp.org  National Kidney Foundation: www.kidney.org  American Kidney Fund: www.akfinc.org  Life Options: www.lifeoptions.org  Kidney [employer]: www.kidneyschool.org Contact a doctor if:  Your symptoms get worse.  You get new symptoms. Get help right away if:  You get symptoms of end-stage kidney disease. These include: ? Headaches. ? Losing feeling in your hands or feet. ? Easy bruising. ? Having hiccups often. ? Chest pain. ? Shortness of breath. ? Lack of menstrual periods, in  women.  You have a fever.  You make less pee than normal.  You have pain or you bleed when you pee or poop. These symptoms may be an emergency. Get help right away. Call your local emergency services (911 in the [place]  Do not wait to see if the symptoms will go away.  Do not drive yourself to the hospital. Summary  Chronic kidney disease is when lasting damage happens to the kidneys slowly over a long time.  Causes of this disease include diabetes and high blood pressure.  Often, there is no cure for this disease. Treatment can help symptoms and help keep the disease from getting worse.  Treatment may involve lifestyle changes, medicines, and dialysis. This information is not intended to replace advice given to you by your health care provider. Make sure you discuss any questions you have with your health care provider. Document Revised: 01/14/2020 Document Reviewed: 01/14/2020 Elsevier Patient Education  2021 Elsevier Inc.  

## 2021-02-14 ENCOUNTER — Telehealth: Payer: Self-pay

## 2021-02-14 ENCOUNTER — Ambulatory Visit (INDEPENDENT_AMBULATORY_CARE_PROVIDER_SITE_OTHER): Payer: Medicare HMO

## 2021-02-14 DIAGNOSIS — F339 Major depressive disorder, recurrent, unspecified: Secondary | ICD-10-CM | POA: Diagnosis not present

## 2021-02-14 DIAGNOSIS — I482 Chronic atrial fibrillation, unspecified: Secondary | ICD-10-CM | POA: Diagnosis not present

## 2021-02-14 DIAGNOSIS — D631 Anemia in chronic kidney disease: Secondary | ICD-10-CM | POA: Diagnosis not present

## 2021-02-14 DIAGNOSIS — Z7901 Long term (current) use of anticoagulants: Secondary | ICD-10-CM | POA: Diagnosis not present

## 2021-02-14 DIAGNOSIS — I509 Heart failure, unspecified: Secondary | ICD-10-CM | POA: Diagnosis not present

## 2021-02-14 DIAGNOSIS — N184 Chronic kidney disease, stage 4 (severe): Secondary | ICD-10-CM | POA: Diagnosis not present

## 2021-02-14 DIAGNOSIS — G2581 Restless legs syndrome: Secondary | ICD-10-CM | POA: Diagnosis not present

## 2021-02-14 DIAGNOSIS — I130 Hypertensive heart and chronic kidney disease with heart failure and stage 1 through stage 4 chronic kidney disease, or unspecified chronic kidney disease: Secondary | ICD-10-CM | POA: Diagnosis not present

## 2021-02-14 DIAGNOSIS — I5033 Acute on chronic diastolic (congestive) heart failure: Secondary | ICD-10-CM | POA: Diagnosis not present

## 2021-02-14 DIAGNOSIS — I5042 Chronic combined systolic (congestive) and diastolic (congestive) heart failure: Secondary | ICD-10-CM | POA: Diagnosis not present

## 2021-02-14 DIAGNOSIS — E1122 Type 2 diabetes mellitus with diabetic chronic kidney disease: Secondary | ICD-10-CM | POA: Diagnosis not present

## 2021-02-14 DIAGNOSIS — F0390 Unspecified dementia without behavioral disturbance: Secondary | ICD-10-CM | POA: Diagnosis not present

## 2021-02-14 NOTE — Telephone Encounter (Signed)
   Post hospital discharge phone call  Spoke with patient's daughter June, she states that she feels her mom is doing fairly well, still has a cough denied any increasing shortness of breath.  Her weighing daily, when she came home from the hospital she weighed 130 over the weekend she got up to 132, so the daughter did give her a dose of Lasix.  Weight is now back down to 130.  She states that her mother had good urine output.  Discussed  her discharge medications.  He discontinued the amlodipine, digoxin and glimepiride.  Started taking the prednisone which she has 1 or 2 more days to take.  Discussed the other medications that she is taking and those are all correct except she was supposed to start taking lisinopril (Zestril) but daughter states due to having trouble with her kidneys in the past she did not start this.  He has an appointment this Friday with her family doctor but asked that she call them today and discussed this medication are to continue to hold it or started up again.  She voices understanding.  Pricilla Riffle RN [facility]

## 2021-02-15 ENCOUNTER — Telehealth: Payer: Self-pay

## 2021-02-15 LAB — POCT INR: INR: 1.5 — AB (ref 2.0–3.0)

## 2021-02-15 NOTE — Progress Notes (Signed)
Pt had MDINR done today 1.5 and per [facility] pt is to continue regular dose (2 mg)

## 2021-02-18 ENCOUNTER — Other Ambulatory Visit: Payer: Self-pay

## 2021-02-18 ENCOUNTER — Ambulatory Visit (INDEPENDENT_AMBULATORY_CARE_PROVIDER_SITE_OTHER): Payer: Medicare HMO | Admitting: Hospice and Palliative Medicine

## 2021-02-18 VITALS — BP 122/74 | HR 100 | Temp 98.5°F | Resp 16 | Ht <= 58 in | Wt 117.8 lb

## 2021-02-18 DIAGNOSIS — R059 Cough, unspecified: Secondary | ICD-10-CM | POA: Diagnosis not present

## 2021-02-18 DIAGNOSIS — R9389 Abnormal findings on diagnostic imaging of other specified body structures: Secondary | ICD-10-CM

## 2021-02-18 DIAGNOSIS — F0390 Unspecified dementia without behavioral disturbance: Secondary | ICD-10-CM | POA: Diagnosis not present

## 2021-02-18 DIAGNOSIS — I5032 Chronic diastolic (congestive) heart failure: Secondary | ICD-10-CM

## 2021-02-18 DIAGNOSIS — I10 Essential (primary) hypertension: Secondary | ICD-10-CM

## 2021-02-18 DIAGNOSIS — F339 Major depressive disorder, recurrent, unspecified: Secondary | ICD-10-CM | POA: Diagnosis not present

## 2021-02-18 DIAGNOSIS — I482 Chronic atrial fibrillation, unspecified: Secondary | ICD-10-CM | POA: Diagnosis not present

## 2021-02-18 DIAGNOSIS — I5042 Chronic combined systolic (congestive) and diastolic (congestive) heart failure: Secondary | ICD-10-CM | POA: Diagnosis not present

## 2021-02-18 DIAGNOSIS — G2581 Restless legs syndrome: Secondary | ICD-10-CM | POA: Diagnosis not present

## 2021-02-18 DIAGNOSIS — N1832 Chronic kidney disease, stage 3b: Secondary | ICD-10-CM | POA: Diagnosis not present

## 2021-02-18 DIAGNOSIS — E1122 Type 2 diabetes mellitus with diabetic chronic kidney disease: Secondary | ICD-10-CM | POA: Diagnosis not present

## 2021-02-18 DIAGNOSIS — I130 Hypertensive heart and chronic kidney disease with heart failure and stage 1 through stage 4 chronic kidney disease, or unspecified chronic kidney disease: Secondary | ICD-10-CM | POA: Diagnosis not present

## 2021-02-18 DIAGNOSIS — N184 Chronic kidney disease, stage 4 (severe): Secondary | ICD-10-CM | POA: Diagnosis not present

## 2021-02-18 DIAGNOSIS — D631 Anemia in chronic kidney disease: Secondary | ICD-10-CM | POA: Diagnosis not present

## 2021-02-18 MED ORDER — BENZONATATE 100 MG PO CAPS: 100.0000 mg | ORAL_CAPSULE | Freq: Two times a day (BID) | ORAL | 0 refills | Status: DC | PRN

## 2021-02-18 NOTE — Progress Notes (Signed)
[facility] [place], [place] [address]  Internal MEDICINE  Office Visit Note  Patient Name: [name]  [id number]  [id number]  Date of Service: 02/23/2021  Transition of care management:Hospital Discharge Follow-up   Chief Complaint  Patient presents with  . Hospitalization Follow-up    Congestive heart failure and pneumonia  . Anemia  . Diabetes  . Gastroesophageal Reflux  . Congestive Heart Failure     HPI Pt is here for recent hospital follow up. Admitted 4/17, discharged 4/21 for acute on chronic CHF At discharge discontinued digoxin, amlodipine as well as glimepiride Has been taking furosemide daily since discharge 20 mg--received IV lasix while hospitalized, since coming home she continued to complain of feeling "full of fluid" [place] to have wet coughing Swelling in ankles Renal failure--set up to see nephrologist outpatient next month Set up to see cardiologist outpatient next week  Was discharged on home oxygen--has been wearing intermittently Advised daughter to get pulse oximeter in order to monitor SpO2 levels advised to place her on oxygen if SpO2 drops below 90% Abnormal CT chest with NSIP--will be set up as new pulmonary patient   Current Medication: Outpatient Encounter Medications as of 02/18/2021  Medication Sig Note  . atorvastatin (LIPITOR) 10 MG tablet Take 1 tablet (10 mg total) by mouth every evening. 02/06/2021: Last filled 12-06-2020 #90  . benzonatate (TESSALON) 100 MG capsule Take 1 capsule (100 mg total) by mouth 2 (two) times daily as needed for cough.   . bisacodyl (DULCOLAX) 5 MG EC tablet Take 1 tablet (5 mg total) by mouth daily as needed for moderate constipation.   . Blood Pressure Monitor KIT Use three times daily to check blood pressure  DX:  I50.42   . citalopram (CELEXA) 40 MG tablet Take 1 tablet (40 mg total) by mouth daily. 02/06/2021: Last filled 01-17-2021 #90  . donepezil (ARICEPT) 5 MG tablet TAKE  1 TABLET BY MOUTH ONCE DAILY FOR MEMORY (Patient taking differently: Take 5 mg by mouth daily. TAKE 1 TABLET BY MOUTH ONCE DAILY FOR MEMORY) 02/06/2021: Last filled 12-06-2020 #90  . ferrous sulfate 325 (65 FE) MG tablet Take 1 tablet (325 mg total) by mouth daily with breakfast.   . fluticasone (FLONASE) 50 MCG/ACT nasal spray Place 1 spray into both nostrils daily. 02/07/2021: Not yet started  . hydrALAZINE (APRESOLINE) 50 MG tablet Take 1 tablet by mouth twice a day, may take an extra 50 mg if Bp is elevated.  Hold if SBP less than 130 mmHg (Patient taking differently: Take 50 mg by mouth See admin instructions. Take 1 tablet (60m) by mouth twice daily -- may take 1 additional tablet (559m by mouth daily if needed for elevated blood pressure) 02/06/2021: Last filled 01-13-2021 #240  . insulin glargine, 2 Unit Dial, (TOUJEO MAX SOLOSTAR) 300 UNIT/ML Solostar Pen Take 6 to 10 units with supper( needs needles as well e 11.65   . melatonin 3 MG TABS tablet Take 3 mg by mouth at bedtime as needed (sleep).   . pantoprazole (PROTONIX) 40 MG tablet Take 1 tablet (40 mg total) by mouth 2 (two) times daily. 02/06/2021: Last filled 01-17-2021 #180  . polyethylene glycol (MIRALAX) 17 g packet Take 17 g by mouth daily.   . promethazine (PHENERGAN) 12.5 MG tablet Take 1 tablet (12.5 mg total) by mouth every 8 (eight) hours as needed for nausea or vomiting. 02/06/2021: Last filled 01-24-2021 #20  . vitamin B-12 1000 MCG tablet Take 1 tablet (  1,000 mcg total) by mouth daily.   Marland Kitchen warfarin (COUMADIN) 2 MG tablet Take 1 tablet (2 mg total) by mouth daily. 02/06/2021: Last filled 01-28-2021 #30  . lisinopril (ZESTRIL) 20 MG tablet Take 1 tablet (20 mg total) by mouth daily. 02/06/2021: Last filled 01-13-2021 #90  . rOPINIRole (REQUIP) 0.5 MG tablet Take 1 tablet (0.5 mg total) by mouth every evening. 02/06/2021: Last filled 01-17-2021 #90   No facility-administered encounter medications on file as of 02/18/2021.    Surgical  History: Past Surgical History:  Procedure Laterality Date  . ABDOMINAL HYSTERECTOMY    . APPENDECTOMY    . CATARACT EXTRACTION    . CHOLECYSTECTOMY    . HERNIA REPAIR    . right knee replacement    . right nephroectomy      Medical History: Past Medical History:  Diagnosis Date  . Anemia   . Atrial fibrillation ([place])   . Congestive heart failure (CHF) ([place])   . Diabetes mellitus without complication ([place])   . GERD (gastroesophageal reflux disease)   . Hyperlipidemia   . Hypertension     Family History: Family History  Problem Relation Age of Onset  . Breast cancer Mother     Social History   Socioeconomic History  . Marital status: Widowed    Spouse name: Not on file  . Number of children: Not on file  . Years of education: Not on file  . Highest education level: Not on file  Occupational History  . Occupation: retired  Tobacco Use  . Smoking status: Current Every Day Smoker    Packs/day: 1.00    Types: Cigarettes    Last attempt to quit: 01/03/2019    Years since quitting: 2.1  . Smokeless tobacco: Never Used  Vaping Use  . Vaping Use: Never used  Substance and Sexual Activity  . Alcohol use: No  . Drug use: No  . Sexual activity: Not Currently  Other Topics Concern  . Not on file  Social History Narrative   Lives with daughter   Social Determinants of Health   Financial Resource Strain: Not on file  Food Insecurity: Not on file  Transportation Needs: Not on file  Physical Activity: Not on file  Stress: Not on file  Social Connections: Not on file  Intimate Partner Violence: Not on file    Review of Systems  Constitutional: Negative for chills, diaphoresis and fatigue.  HENT: Negative for ear pain, postnasal drip and sinus pressure.   Eyes: Negative for photophobia, discharge, redness, itching and visual disturbance.  Respiratory: Positive for cough. Negative for shortness of breath and wheezing.   Cardiovascular: Positive for leg swelling.  Negative for chest pain and palpitations.  Gastrointestinal: Negative for abdominal pain, constipation, diarrhea, nausea and vomiting.  Genitourinary: Negative for dysuria and flank pain.  Musculoskeletal: Negative for arthralgias, back pain, gait problem and neck pain.  Skin: Negative for color change.  Allergic/Immunologic: Negative for environmental allergies and food allergies.  Neurological: Negative for dizziness and headaches.  Hematological: Does not bruise/bleed easily.  Psychiatric/Behavioral: Negative for agitation, behavioral problems (depression) and hallucinations.    Vital Signs: BP 122/74   Pulse 100   Temp 98.5 F (36.9 C)   Resp 16   Ht '4\' 10"'  (1.473 m)   Wt 117 lb 12.8 oz (53.4 kg)   SpO2 92%   BMI 24.62 kg/m    Physical Exam Vitals reviewed.  Constitutional:      Appearance: Normal appearance. She is normal weight.  Cardiovascular:     Rate and Rhythm: Normal rate and regular rhythm.     Pulses: Normal pulses.     Heart sounds: Normal heart sounds.  Pulmonary:     Effort: Pulmonary effort is normal.     Breath sounds: Normal breath sounds.  Abdominal:     General: Abdomen is flat.     Palpations: Abdomen is soft.  Musculoskeletal:        General: Normal range of motion.  Skin:    General: Skin is warm.  Neurological:     General: No focal deficit present.     Mental Status: She is alert and oriented to person, place, and time. Mental status is at baseline.  Psychiatric:        Mood and Affect: Mood normal.        Behavior: Behavior normal.        Thought Content: Thought content normal.        Judgment: Judgment normal.    Assessment/Plan: 1. Chronic diastolic congestive heart failure (HCC) Advised to drop furosemide to every other day Continue to hold digoxin due to declining kidney function and discuss alternative options with cardiologist at upcoming office visit  2. Essential hypertension BP and HR well controlled, continue to  monitor  3. Stage 3b chronic kidney disease ([place]) Set up to have upcoming outpatient office visit with nephrologist Advised to start furosemide every other day as tolerated  4. Abnormal chest CT Will be set up for pulmonary for further evaluation and management  5. Cough Multi-factorial--may use Tessalon as needed for cough Will be set up for pulmonology evaluation - benzonatate (TESSALON) 100 MG capsule; Take 1 capsule (100 mg total) by mouth 2 (two) times daily as needed for cough.  Dispense: 20 capsule; Refill: 0  General Counseling: Haruna verbalizes understanding of the findings of todays visit and agrees with plan of treatment. I have discussed any further diagnostic evaluation that may be needed or ordered today. We also reviewed her medications today. she has been encouraged to call the office with any questions or concerns that should arise related to todays visit.   I have reviewed all medical records from hospital follow up including radiology reports and consults from other physicians. Appropriate follow up diagnostics will be scheduled as needed. Patient/ Family understands the plan of treatment.   Time spent 30 minutes.  This patient was seen by Theodoro Grist AGNP-C in Collaboration with Dr Lavera Guise as a part of collaborative care agreement  Tanna Furry. [facility] Internal Medicine

## 2021-02-19 DIAGNOSIS — I130 Hypertensive heart and chronic kidney disease with heart failure and stage 1 through stage 4 chronic kidney disease, or unspecified chronic kidney disease: Secondary | ICD-10-CM | POA: Diagnosis not present

## 2021-02-19 DIAGNOSIS — F339 Major depressive disorder, recurrent, unspecified: Secondary | ICD-10-CM | POA: Diagnosis not present

## 2021-02-19 DIAGNOSIS — J441 Chronic obstructive pulmonary disease with (acute) exacerbation: Secondary | ICD-10-CM | POA: Diagnosis not present

## 2021-02-19 DIAGNOSIS — I482 Chronic atrial fibrillation, unspecified: Secondary | ICD-10-CM | POA: Diagnosis not present

## 2021-02-19 DIAGNOSIS — I5042 Chronic combined systolic (congestive) and diastolic (congestive) heart failure: Secondary | ICD-10-CM | POA: Diagnosis not present

## 2021-02-19 DIAGNOSIS — N184 Chronic kidney disease, stage 4 (severe): Secondary | ICD-10-CM | POA: Diagnosis not present

## 2021-02-19 DIAGNOSIS — G2581 Restless legs syndrome: Secondary | ICD-10-CM | POA: Diagnosis not present

## 2021-02-19 DIAGNOSIS — E1122 Type 2 diabetes mellitus with diabetic chronic kidney disease: Secondary | ICD-10-CM | POA: Diagnosis not present

## 2021-02-19 DIAGNOSIS — F0390 Unspecified dementia without behavioral disturbance: Secondary | ICD-10-CM | POA: Diagnosis not present

## 2021-02-21 ENCOUNTER — Ambulatory Visit: Payer: Medicare HMO | Admitting: Physician Assistant

## 2021-02-21 NOTE — Telephone Encounter (Signed)
Pt had OV on 02/18/21

## 2021-02-23 ENCOUNTER — Encounter: Payer: Self-pay | Admitting: Hospice and Palliative Medicine

## 2021-02-24 ENCOUNTER — Other Ambulatory Visit: Payer: Self-pay

## 2021-02-24 ENCOUNTER — Ambulatory Visit: Payer: Medicare HMO | Admitting: Internal Medicine

## 2021-02-24 ENCOUNTER — Encounter: Payer: Self-pay | Admitting: Internal Medicine

## 2021-02-24 VITALS — BP 116/48 | HR 122 | Temp 98.4°F | Resp 16 | Ht <= 58 in | Wt 114.6 lb

## 2021-02-24 DIAGNOSIS — N184 Chronic kidney disease, stage 4 (severe): Secondary | ICD-10-CM

## 2021-02-24 DIAGNOSIS — J8410 Pulmonary fibrosis, unspecified: Secondary | ICD-10-CM

## 2021-02-24 DIAGNOSIS — I4891 Unspecified atrial fibrillation: Secondary | ICD-10-CM | POA: Diagnosis not present

## 2021-02-24 DIAGNOSIS — I5032 Chronic diastolic (congestive) heart failure: Secondary | ICD-10-CM | POA: Diagnosis not present

## 2021-02-24 DIAGNOSIS — E1122 Type 2 diabetes mellitus with diabetic chronic kidney disease: Secondary | ICD-10-CM

## 2021-02-24 MED ORDER — BENZONATATE 200 MG PO CAPS: 200.0000 mg | ORAL_CAPSULE | Freq: Two times a day (BID) | ORAL | 1 refills | Status: DC | PRN

## 2021-02-24 NOTE — Patient Instructions (Signed)
Pulmonary Fibrosis  Pulmonary fibrosis is a type of lung disease that causes scarring. Over time, the scar tissue builds up in the air sacs of your lungs (alveoli). This makes it hard for you to breathe. Less oxygen can get into your blood. Scarring from pulmonary fibrosis gets worse over time. This damage is permanent and may lead to other serious health problems. What are the causes? There are many different causes of pulmonary fibrosis. Sometimes the cause is not known. This is called idiopathic pulmonary fibrosis. Other causes include:  Exposure to chemicals and substances found in agricultural, farm, Architect, or factory work. These include mold, asbestos, silica, metal dusts, and toxic fumes.  Sarcoidosis. In this disease, areas of inflammatory cells (granulomas) form and most often affect the lungs.  Autoimmune diseases. These include diseases such as rheumatoid arthritis, systemic sclerosis, or connective tissue disease.  Taking certain medicines. These include drugs used in radiation therapy or used to treat seizures, heart problems, and some infections. What increases the risk? You are more likely to develop this condition if:  You have a family history of the disease.  You are older. The condition is more common in older adults.  You have a history of smoking.  You have a job that exposes you to certain chemicals.  You have gastroesophageal reflux disease (GERD). What are the signs or symptoms? Symptoms of this condition include:  Difficulty breathing that gets worse with activity.  Shortness of breath (dyspnea).  Dry, hacking cough.  Rapid, shallow breathing during exercise or while at rest.  Bluish skin and lips.  Loss of appetite.  Weakness.  Weight loss and fatigue.  Rounded and enlarged fingertips (clubbing). How is this diagnosed? This condition may be diagnosed based on:  Your symptoms and medical history.  A physical exam. You may also have  tests, including:  A test that involves looking inside your lungs with an instrument (bronchoscopy).  Imaging studies of your lungs and heart.  Tests to measure how well you are breathing (pulmonary function tests).  Blood tests.  Tests to see how well your lungs work while you are walking (pulmonary stress test).  A procedure to remove a lung tissue sample to look at it under a microscope (biopsy). How is this treated? There is no cure for pulmonary fibrosis. Treatment focuses on managing symptoms and preventing scarring from getting worse. This may include:  Medicines, such as: ? Steroids to prevent permanent lung changes. ? Medicines to suppress your body's defense system (immune system). ? Medicines to help with lung function by reducing inflammation or scarring.  Ongoing monitoring with X-rays and lab work.  Oxygen therapy.  Pulmonary rehabilitation.  Surgery. In some cases, a lung transplant is possible. Follow these instructions at home: Medicines  Take over-the-counter and prescription medicines only as told by your health care provider.  Keep your vaccinations up to date as recommended by your health care provider. General instructions  Do not use any products that contain nicotine or tobacco, such as cigarettes and e-cigarettes. If you need help quitting, ask your health care provider.  Get regular exercise, but do not overexert yourself. Ask your health care provider to suggest some activities that are safe for you to do. ? If you have physical limitations, you may get exercise by walking, using a stationary bike, or doing chair exercises. ? Ask your health care provider about using oxygen while exercising.  If you are exposed to chemicals and substances at work, make sure that you  wear a mask or respirator at all times.  Join a pulmonary rehabilitation program or a support group for people with pulmonary fibrosis.  Eat small meals often so you do not get too  full. Overeating can make breathing trouble worse.  Maintain a healthy weight. Lose weight if you need to.  Do breathing exercises as directed by your health care provider.  Keep all follow-up visits as told by your health care provider. This is important.      Contact a health care provider if you:  Have symptoms that do not get better with medicines.  Are not able to be as active as usual.  Have trouble taking a deep breath.  Have a fever or chills.  Have blue lips or skin.  Have clubbing of your fingers. Get help right away if you:  Have a sudden worsening of your symptoms.  Have chest pain.  Cough up mucus that is dark in color.  Have a lot of headaches.  Get very confused or sleepy. Summary  Pulmonary fibrosis is a type of lung disease that causes scar tissue to build up in the air sacs of your lungs (alveoli) over time. Less oxygen can get into your blood. This makes it hard for you to breathe.  Scarring from pulmonary fibrosis gets worse over time. This damage is permanent and may lead to other serious health problems.  You are more likely to develop this condition if you have a family history of the condition or a job that exposes you to certain chemicals.  There is no cure for pulmonary fibrosis. Treatment focuses on managing symptoms and preventing scarring from getting worse. This information is not intended to replace advice given to you by your health care provider. Make sure you discuss any questions you have with your health care provider. Document Revised: 11/14/2017 Document Reviewed: 11/14/2017 [employer]vier Patient Education  2021 [employer]  

## 2021-02-24 NOTE — Progress Notes (Signed)
[facility] [place], [place] [address]  Pulmonary Sleep Medicine   Office Visit Note  Patient Name: [name] DOB: Feb 28, 1941 MRN [id number]  Date of Service: 02/24/2021  Complaints/HPI: Pulmonary fibrosis patient is referred to me for evaluation of pulmonary fibrosis.  She basically was admitted to the hospital was initially diagnosed with congestive heart failure.  The CT scan had shown peripheral fibrotic changes which were noted on also back in 2020.  The patient does have shortness of breath mainly on exertion.  She notes that it is significantly worse up an incline she is not sure about on flat surfaces.  She was prescribed oxygen which she states that she is using.  In addition states that she was a smoker smoked up until a few months to year ago and she was smoking about half pack a day.  The patient also carries a diagnosis of congestive heart failure however reviewing her echocardiogram it does not appear that she has systolic failure based on the last echo.  She does follow with cardiology for this.  In addition she states that she works in [employer] during her working career so she was exposed to some dust.  Her daughter states that the husband was possibly also working with a Sales promotion account executive while he was alive and working  ROS  General: (-) fever, (-) chills, (-) night sweats, (-) weakness Skin: (-) rashes, (-) itching,. Eyes: (-) visual changes, (-) redness, (-) itching. Nose and Sinuses: (-) nasal stuffiness or itchiness, (-) postnasal drip, (-) nosebleeds, (-) sinus trouble. Mouth and Throat: (-) sore throat, (-) hoarseness. Neck: (-) swollen glands, (-) enlarged thyroid, (-) neck pain. Respiratory: + cough, (-) bloody sputum, + shortness of breath, - wheezing. Cardiovascular: - ankle swelling, (-) chest pain. Lymphatic: (-) lymph node enlargement. Neurologic: (-) numbness, (-) tingling. Psychiatric: (-) anxiety, (-) depression   Current  Medication: Outpatient Encounter Medications as of 02/24/2021  Medication Sig Note  . atorvastatin (LIPITOR) 10 MG tablet Take 1 tablet (10 mg total) by mouth every evening. 02/06/2021: Last filled 12-06-2020 #90  . benzonatate (TESSALON) 100 MG capsule Take 1 capsule (100 mg total) by mouth 2 (two) times daily as needed for cough.   . bisacodyl (DULCOLAX) 5 MG EC tablet Take 1 tablet (5 mg total) by mouth daily as needed for moderate constipation.   . Blood Pressure Monitor KIT Use three times daily to check blood pressure  DX:  I50.42   . citalopram (CELEXA) 40 MG tablet Take 1 tablet (40 mg total) by mouth daily. 02/06/2021: Last filled 01-17-2021 #90  . donepezil (ARICEPT) 5 MG tablet TAKE 1 TABLET BY MOUTH ONCE DAILY FOR MEMORY (Patient taking differently: Take 5 mg by mouth daily. TAKE 1 TABLET BY MOUTH ONCE DAILY FOR MEMORY) 02/06/2021: Last filled 12-06-2020 #90  . ferrous sulfate 325 (65 FE) MG tablet Take 1 tablet (325 mg total) by mouth daily with breakfast.   . fluticasone (FLONASE) 50 MCG/ACT nasal spray Place 1 spray into both nostrils daily. 02/07/2021: Not yet started  . hydrALAZINE (APRESOLINE) 50 MG tablet Take 1 tablet by mouth twice a day, may take an extra 50 mg if Bp is elevated.  Hold if SBP less than 130 mmHg (Patient taking differently: Take 50 mg by mouth See admin instructions. Take 1 tablet (83m) by mouth twice daily -- may take 1 additional tablet (551m by mouth daily if needed for elevated blood pressure) 02/06/2021: Last filled 01-13-2021 #240  .  insulin glargine, 2 Unit Dial, (TOUJEO MAX SOLOSTAR) 300 UNIT/ML Solostar Pen Take 6 to 10 units with supper( needs needles as well e 11.65   . lisinopril (ZESTRIL) 20 MG tablet Take 1 tablet (20 mg total) by mouth daily. 02/06/2021: Last filled 01-13-2021 #90  . melatonin 3 MG TABS tablet Take 3 mg by mouth at bedtime as needed (sleep).   . pantoprazole (PROTONIX) 40 MG tablet Take 1 tablet (40 mg total) by mouth 2 (two) times daily.  02/06/2021: Last filled 01-17-2021 #180  . polyethylene glycol (MIRALAX) 17 g packet Take 17 g by mouth daily.   . promethazine (PHENERGAN) 12.5 MG tablet Take 1 tablet (12.5 mg total) by mouth every 8 (eight) hours as needed for nausea or vomiting. 02/06/2021: Last filled 01-24-2021 #20  . rOPINIRole (REQUIP) 0.5 MG tablet Take 1 tablet (0.5 mg total) by mouth every evening. 02/06/2021: Last filled 01-17-2021 #90  . vitamin B-12 1000 MCG tablet Take 1 tablet (1,000 mcg total) by mouth daily.   Marland Kitchen warfarin (COUMADIN) 2 MG tablet Take 1 tablet (2 mg total) by mouth daily. 02/06/2021: Last filled 01-28-2021 #30   No facility-administered encounter medications on file as of 02/24/2021.    Surgical History: Past Surgical History:  Procedure Laterality Date  . ABDOMINAL HYSTERECTOMY    . APPENDECTOMY    . CATARACT EXTRACTION    . CHOLECYSTECTOMY    . HERNIA REPAIR    . right knee replacement    . right nephroectomy      Medical History: Past Medical History:  Diagnosis Date  . Anemia   . Atrial fibrillation ([place])   . Congestive heart failure (CHF) ([place])   . Diabetes mellitus without complication ([place])   . GERD (gastroesophageal reflux disease)   . Hyperlipidemia   . Hypertension   . Pneumonia     Family History: Family History  Problem Relation Age of Onset  . Breast cancer Mother     Social History: Social History   Socioeconomic History  . Marital status: Widowed    Spouse name: Not on file  . Number of children: Not on file  . Years of education: Not on file  . Highest education level: Not on file  Occupational History  . Occupation: retired  Tobacco Use  . Smoking status: Former Smoker    Packs/day: 1.00    Types: Cigarettes    Quit date: 02/10/2021    Years since quitting: 0.0  . Smokeless tobacco: Never Used  Vaping Use  . Vaping Use: Never used  Substance and Sexual Activity  . Alcohol use: No  . Drug use: No  . Sexual activity: Not Currently  Other Topics Concern   . Not on file  Social History Narrative   Lives with daughter   Social Determinants of Health   Financial Resource Strain: Not on file  Food Insecurity: Not on file  Transportation Needs: Not on file  Physical Activity: Not on file  Stress: Not on file  Social Connections: Not on file  Intimate Partner Violence: Not on file    Vital Signs: Blood pressure (!) 116/48, pulse (!) 122, temperature 98.4 F (36.9 C), resp. rate 16, height '4\' 10"'  (1.473 m), weight 114 lb 9.6 oz (52 kg), SpO2 97 %.  Examination: General Appearance: The patient is well-developed, well-nourished, and in no distress. Skin: Gross inspection of skin unremarkable. Head: normocephalic, no gross deformities. Eyes: no gross deformities noted. ENT: ears appear grossly normal no exudates. Neck: Supple. No  thyromegaly. No LAD. Respiratory: few crackles are noted. Cardiovascular: Normal S1 and S2 without murmur or rub. Extremities: No cyanosis. pulses are equal. Neurologic: Alert and oriented. No involuntary movements.  LABS: Recent Results (from the past 2160 hour(s))  Protime-INR     Status: None   Collection Time: 11/29/20 12:00 AM  Result Value Ref Range   INR    POCT INR     Status: Abnormal   Collection Time: 12/06/20 12:10 PM  Result Value Ref Range   INR 5.7 (A) 2.0 - 3.0    Comment: INR 5.7, PT 67.9  Fe+TIBC+Fer     Status: Abnormal   Collection Time: 12/06/20  1:13 PM  Result Value Ref Range   Total Iron Binding Capacity 292 250 - 450 ug/dL   UIBC 274 118 - 369 ug/dL   Iron 18 (L) 27 - 139 ug/dL   Iron Saturation 6 (LL) 15 - 55 %   Ferritin 26 15 - 150 ng/mL  CBC With Differential     Status: Abnormal   Collection Time: 12/06/20  1:16 PM  Result Value Ref Range   WBC 10.6 3.4 - 10.8 x10E3/uL   RBC 3.86 3.77 - 5.28 x10E6/uL   Hemoglobin 9.5 (L) 11.1 - 15.9 g/dL   Hematocrit 30.4 (L) 34.0 - 46.6 %   MCV 79 79 - 97 fL   MCH 24.6 (L) 26.6 - 33.0 pg   MCHC 31.3 (L) 31.5 - 35.7 g/dL   RDW  19.0 (H) 11.7 - 15.4 %   Neutrophils 66 Not Estab. %   Lymphs 27 Not Estab. %   Monocytes 4 Not Estab. %   Eos 2 Not Estab. %   Basos 0 Not Estab. %   Neutrophils Absolute 7.0 1.4 - 7.0 x10E3/uL   Lymphocytes Absolute 2.8 0.7 - 3.1 x10E3/uL   Monocytes Absolute 0.5 0.1 - 0.9 x10E3/uL   EOS (ABSOLUTE) 0.2 0.0 - 0.4 x10E3/uL   Basophils Absolute 0.0 0.0 - 0.2 x10E3/uL   Immature Granulocytes 1 Not Estab. %   Immature Grans (Abs) 0.1 0.0 - 0.1 x10E3/uL  Comprehensive metabolic panel     Status: Abnormal   Collection Time: 12/06/20  1:16 PM  Result Value Ref Range   Glucose 112 (H) 65 - 99 mg/dL   BUN 32 (H) 8 - 27 mg/dL   Creatinine, Ser 2.18 (H) 0.57 - 1.00 mg/dL   GFR calc non Af Amer 21 (L) >59 mL/min/1.73   GFR calc Af Amer 24 (L) >59 mL/min/1.73    Comment: **In accordance with recommendations from the NKF-ASN Task force,**   [facility] is in the process of updating its eGFR calculation to the   2021 CKD-EPI creatinine equation that estimates kidney function   without a race variable.    BUN/Creatinine Ratio 15 12 - 28   Sodium 134 134 - 144 mmol/L   Potassium 4.2 3.5 - 5.2 mmol/L   Chloride 102 96 - 106 mmol/L   CO2 19 (L) 20 - 29 mmol/L   Calcium 8.4 (L) 8.7 - 10.3 mg/dL   Total Protein 6.6 6.0 - 8.5 g/dL   Albumin 2.8 (L) 3.7 - 4.7 g/dL   Globulin, Total 3.8 1.5 - 4.5 g/dL   Albumin/Globulin Ratio 0.7 (L) 1.2 - 2.2   Bilirubin Total 0.2 0.0 - 1.2 mg/dL   Alkaline Phosphatase 99 44 - 121 IU/L   AST 15 0 - 40 IU/L   ALT 8 0 - 32 IU/L  Basic metabolic panel  Status: Abnormal   Collection Time: 12/23/20 10:40 AM  Result Value Ref Range   Sodium 137 135 - 145 mmol/L   Potassium 4.1 3.5 - 5.1 mmol/L   Chloride 108 98 - 111 mmol/L   CO2 22 22 - 32 mmol/L   Glucose, Bld 187 (H) 70 - 99 mg/dL    Comment: Glucose reference range applies only to samples taken after fasting for at least 8 hours.   BUN 17 8 - 23 mg/dL   Creatinine, Ser 2.02 (H) 0.44 - 1.00 mg/dL   Calcium  8.3 (L) 8.9 - 10.3 mg/dL   GFR, Estimated 25 (L) >60 mL/min    Comment: (NOTE) Calculated using the CKD-EPI Creatinine Equation (2021)    Anion gap 7 5 - 15    Comment: Performed at [facility], [address] [place], [place] [address]  CBC     Status: Abnormal   Collection Time: 12/23/20 10:40 AM  Result Value Ref Range   WBC 6.8 4.0 - 10.5 K/uL   RBC 3.56 (L) 3.87 - 5.11 MIL/uL   Hemoglobin 8.7 (L) 12.0 - 15.0 g/dL   HCT 28.8 (L) 36.0 - 46.0 %   MCV 80.9 80.0 - 100.0 fL   MCH 24.4 (L) 26.0 - 34.0 pg   MCHC 30.2 30.0 - 36.0 g/dL   RDW 21.9 (H) 11.5 - 15.5 %   Platelets 221 150 - 400 K/uL   nRBC 0.0 0.0 - 0.2 %    Comment: Performed at [facility], [address] [place], [place] [address]  Urinalysis, Complete w Microscopic Urine, Clean Catch     Status: Abnormal   Collection Time: 12/23/20 10:40 AM  Result Value Ref Range   Color, Urine YELLOW (A) YELLOW   APPearance HAZY (A) CLEAR   Specific Gravity, Urine 1.013 1.005 - 1.030   pH 6.0 5.0 - 8.0   Glucose, UA 150 (A) NEGATIVE mg/dL   Hgb urine dipstick MODERATE (A) NEGATIVE   Bilirubin Urine NEGATIVE NEGATIVE   Ketones, ur NEGATIVE NEGATIVE mg/dL   Protein, ur >=300 (A) NEGATIVE mg/dL   Nitrite NEGATIVE NEGATIVE   Leukocytes,Ua NEGATIVE NEGATIVE   RBC / HPF 21-50 0 - 5 RBC/hpf   WBC, UA 0-5 0 - 5 WBC/hpf   Bacteria, UA RARE (A) NONE SEEN   Squamous Epithelial / LPF 6-10 0 - 5   Mucus PRESENT     Comment: Performed at [facility], [address] [place], [place] [address]  Urine Culture     Status: Abnormal   Collection Time: 12/23/20 10:40 AM   Specimen: Urine, Random  Result Value Ref Range   Specimen Description      URINE, RANDOM Performed at [facility], [address], [place], [place] [address]    Special Requests      NONE Performed at [facility], [address] [place], [place] [address]    Culture MULTIPLE SPECIES PRESENT, SUGGEST RECOLLECTION  (A)    Report Status 12/25/2020 FINAL   Troponin I (High Sensitivity)     Status: Abnormal   Collection Time: 12/23/20 11:24 AM  Result Value Ref Range   Troponin I (High Sensitivity) 18 (H) <18 ng/L    Comment: (NOTE) Elevated high sensitivity troponin I (hsTnI) values and significant  changes across serial measurements may suggest ACS but many other  chronic and acute conditions are known to elevate hsTnI results.  Refer to the "Links" section for chest pain algorithms and  additional  guidance. Performed at [facility], [address] [place], [place] [address]   Resp Panel by RT-PCR (Flu A&B, Covid) Nasopharyngeal Swab     Status: None   Collection Time: 12/23/20 12:52 PM   Specimen: Nasopharyngeal Swab; Nasopharyngeal(NP) swabs in vial transport medium  Result Value Ref Range   SARS Coronavirus 2 by RT PCR NEGATIVE NEGATIVE    Comment: (NOTE) SARS-CoV-2 target nucleic acids are NOT DETECTED.  The SARS-CoV-2 RNA is generally detectable in upper respiratory specimens during the acute phase of infection. The lowest concentration of SARS-CoV-2 viral copies this assay can detect is 138 copies/mL. A negative result does not preclude SARS-Cov-2 infection and should not be used as the sole basis for treatment or other patient management decisions. A negative result may occur with  improper specimen collection/handling, submission of specimen other than nasopharyngeal swab, presence of viral mutation(s) within the areas targeted by this assay, and inadequate number of viral copies(<138 copies/mL). A negative result must be combined with clinical observations, patient history, and epidemiological information. The expected result is Negative.  Fact Sheet for Patients:  EntrepreneurPulse.com.au  Fact Sheet for Healthcare Providers:  IncredibleEmployment.be  This test is no t yet approved or cleared by the [place] FDA and  has  been authorized for detection and/or diagnosis of SARS-CoV-2 by FDA under an Emergency Use Authorization (EUA). This EUA will remain  in effect (meaning this test can be used) for the duration of the COVID-19 declaration under Section 564(b)(1) of the Act, 21 U.S.C.section 360bbb-3(b)(1), unless the authorization is terminated  or revoked sooner.       Influenza A by PCR NEGATIVE NEGATIVE   Influenza B by PCR NEGATIVE NEGATIVE    Comment: (NOTE) The Xpert Xpress SARS-CoV-2/FLU/RSV plus assay is intended as an aid in the diagnosis of influenza from Nasopharyngeal swab specimens and should not be used as a sole basis for treatment. Nasal washings and aspirates are unacceptable for Xpert Xpress SARS-CoV-2/FLU/RSV testing.  Fact Sheet for Patients: EntrepreneurPulse.com.au  Fact Sheet for Healthcare Providers: IncredibleEmployment.be  This test is not yet approved or cleared by the [place] FDA and has been authorized for detection and/or diagnosis of SARS-CoV-2 by FDA under an Emergency Use Authorization (EUA). This EUA will remain in effect (meaning this test can be used) for the duration of the COVID-19 declaration under Section 564(b)(1) of the Act, 21 U.S.C. section 360bbb-3(b)(1), unless the authorization is terminated or revoked.  Performed at [facility], [place], [place] [address]   Troponin I (High Sensitivity)     Status: Abnormal   Collection Time: 12/23/20 12:52 PM  Result Value Ref Range   Troponin I (High Sensitivity) 18 (H) <18 ng/L    Comment: (NOTE) Elevated high sensitivity troponin I (hsTnI) values and significant  changes across serial measurements may suggest ACS but many other  chronic and acute conditions are known to elevate hsTnI results.  Refer to the "Links" section for chest pain algorithms and additional  guidance. Performed at [facility], [place], [place] [address]   Troponin I (High Sensitivity)     Status: Abnormal   Collection Time: 12/23/20  3:07 PM  Result Value Ref Range   Troponin I (High Sensitivity) 19 (H) <18 ng/L    Comment: (NOTE) Elevated high sensitivity troponin I (hsTnI) values and significant  changes across serial measurements may suggest ACS but many other  chronic and acute conditions are known to  elevate hsTnI results.  Refer to the "Links" section for chest pain algorithms and additional  guidance. Performed at [facility], [address] [place], [place] [address]   Protime-INR     Status: Abnormal   Collection Time: 12/23/20  3:07 PM  Result Value Ref Range   Prothrombin Time 16.8 (H) 11.4 - 15.2 seconds   INR 1.4 (H) 0.8 - 1.2    Comment: (NOTE) INR goal varies based on device and disease states. Performed at [facility], [address], [place], [place] [address]   Digoxin level     Status: None   Collection Time: 12/23/20  3:07 PM  Result Value Ref Range   Digoxin Level 1.6 0.8 - 2.0 ng/mL    Comment: Performed at [facility], [address] [place], [place] [address]  CBG monitoring, ED     Status: None   Collection Time: 12/23/20  4:13 PM  Result Value Ref Range   Glucose-Capillary 99 70 - 99 mg/dL    Comment: Glucose reference range applies only to samples taken after fasting for at least 8 hours.  Troponin I (High Sensitivity)     Status: Abnormal   Collection Time: 12/23/20  4:42 PM  Result Value Ref Range   Troponin I (High Sensitivity) 18 (H) <18 ng/L    Comment: (NOTE) Elevated high sensitivity troponin I (hsTnI) values and significant  changes across serial measurements may suggest ACS but many other  chronic and acute conditions are known to elevate hsTnI results.  Refer to the "Links" section for chest pain algorithms and additional  guidance. Performed at [facility], [address] [place], [place] [address]   CBG  monitoring, ED     Status: Abnormal   Collection Time: 12/23/20  7:34 PM  Result Value Ref Range   Glucose-Capillary 106 (H) 70 - 99 mg/dL    Comment: Glucose reference range applies only to samples taken after fasting for at least 8 hours.  Glucose, capillary     Status: Abnormal   Collection Time: 12/23/20 11:49 PM  Result Value Ref Range   Glucose-Capillary 106 (H) 70 - 99 mg/dL    Comment: Glucose reference range applies only to samples taken after fasting for at least 8 hours.  Hemoglobin A1c     Status: Abnormal   Collection Time: 12/24/20  4:56 AM  Result Value Ref Range   Hgb A1c MFr Bld 6.9 (H) 4.8 - 5.6 %    Comment: (NOTE) Pre diabetes:          5.7%-6.4%  Diabetes:              >6.4%  Glycemic control for   <7.0% adults with diabetes    Mean Plasma Glucose 151.33 mg/dL    Comment: Performed at [facility] [address], [place], [place] [address]  Lipid panel     Status: Abnormal   Collection Time: 12/24/20  4:56 AM  Result Value Ref Range   Cholesterol 97 0 - 200 mg/dL   Triglycerides 153 (H) <150 mg/dL   HDL 22 (L) >40 mg/dL   Total CHOL/HDL Ratio 4.4 RATIO   VLDL 31 0 - 40 mg/dL   LDL Cholesterol 44 0 - 99 mg/dL    Comment:        Total Cholesterol/HDL:CHD Risk Coronary Heart Disease Risk Table  Men   Women  1/2 Average Risk   3.4   3.3  Average Risk       5.0   4.4  2 X Average Risk   9.6   7.1  3 X Average Risk  23.4   11.0        Use the calculated Patient Ratio above and the CHD Risk Table to determine the patient's CHD Risk.        ATP III CLASSIFICATION (LDL):  <100     mg/dL   Optimal  100-129  mg/dL   Near or Above                    Optimal  130-159  mg/dL   Borderline  160-189  mg/dL   High  >190     mg/dL   Very High Performed at [facility], [address] [place], [place] [address]   Protime-INR     Status: Abnormal   Collection Time: 12/24/20  4:56 AM  Result Value Ref Range   Prothrombin  Time 16.5 (H) 11.4 - 15.2 seconds   INR 1.4 (H) 0.8 - 1.2    Comment: (NOTE) INR goal varies based on device and disease states. Performed at [facility], [address] [place], [place] [address]   CBC     Status: Abnormal   Collection Time: 12/24/20  4:56 AM  Result Value Ref Range   WBC 8.0 4.0 - 10.5 K/uL   RBC 3.69 (L) 3.87 - 5.11 MIL/uL   Hemoglobin 9.1 (L) 12.0 - 15.0 g/dL   HCT 29.4 (L) 36.0 - 46.0 %   MCV 79.7 (L) 80.0 - 100.0 fL   MCH 24.7 (L) 26.0 - 34.0 pg   MCHC 31.0 30.0 - 36.0 g/dL   RDW 22.4 (H) 11.5 - 15.5 %   Platelets 215 150 - 400 K/uL   nRBC 0.0 0.0 - 0.2 %    Comment: Performed at [facility], [address] [place], [place] [address]  Glucose, capillary     Status: Abnormal   Collection Time: 12/24/20  7:35 AM  Result Value Ref Range   Glucose-Capillary 118 (H) 70 - 99 mg/dL    Comment: Glucose reference range applies only to samples taken after fasting for at least 8 hours.  Comprehensive metabolic panel     Status: Abnormal   Collection Time: 12/24/20  8:41 AM  Result Value Ref Range   Sodium 140 135 - 145 mmol/L   Potassium 4.2 3.5 - 5.1 mmol/L   Chloride 110 98 - 111 mmol/L   CO2 22 22 - 32 mmol/L   Glucose, Bld 115 (H) 70 - 99 mg/dL    Comment: Glucose reference range applies only to samples taken after fasting for at least 8 hours.   BUN 17 8 - 23 mg/dL   Creatinine, Ser 1.84 (H) 0.44 - 1.00 mg/dL   Calcium 8.1 (L) 8.9 - 10.3 mg/dL   Total Protein 6.1 (L) 6.5 - 8.1 g/dL   Albumin 2.2 (L) 3.5 - 5.0 g/dL   AST 15 15 - 41 U/L   ALT 9 0 - 44 U/L   Alkaline Phosphatase 79 38 - 126 U/L   Total Bilirubin 0.9 0.3 - 1.2 mg/dL   GFR, Estimated 28 (L) >60 mL/min    Comment: (NOTE) Calculated using the CKD-EPI Creatinine Equation (2021)    Anion gap 8 5 - 15    Comment: Performed at [facility], [address] [place], [place] [address]  CK     Status: None   Collection Time: 12/24/20  8:41 AM  Result Value Ref  Range   Total CK 52 38 - 234 U/L    Comment: Performed at [facility], [address] [place], [place] [address]  TSH     Status: None   Collection Time: 12/24/20  8:41 AM  Result Value Ref Range   TSH 2.913 0.350 - 4.500 uIU/mL    Comment: Performed by a 3rd Generation assay with a functional sensitivity of <=0.01 uIU/mL. Performed at [facility], [address] [place], [place] [address]   Hepatitis C antibody     Status: None   Collection Time: 12/24/20  8:41 AM  Result Value Ref Range   HCV Ab NON REACTIVE NON REACTIVE    Comment: (NOTE) Nonreactive HCV antibody screen is consistent with no HCV infections,  unless recent infection is suspected or other evidence exists to indicate HCV infection.  Performed at [facility], [place] [address] [place], [place] [address]   Vitamin B12     Status: Abnormal   Collection Time: 12/24/20  8:41 AM  Result Value Ref Range   Vitamin B-12 151 (L) 180 - 914 pg/mL    Comment: (NOTE) This assay is not validated for testing neonatal or myeloproliferative syndrome specimens for Vitamin B12 levels. Performed at [facility], [place] [address], [place], [place] [address]   ECHOCARDIOGRAM COMPLETE     Status: None   Collection Time: 12/24/20  9:46 AM  Result Value Ref Range   Weight 2,080 oz   Height 58 in   BP 185/65 mmHg   Ao pk vel 1.43 m/s   AV Area VTI 2.00 cm2   AR max vel 1.81 cm2   AV Mean grad 4.7 mmHg   AV Peak grad 8.1 mmHg   S' Lateral 2.70 cm   AV Area mean vel 2.01 cm2   Area-P 1/2 5.46 cm2   P 1/2 time 598 msec  Glucose, capillary     Status: Abnormal   Collection Time: 12/24/20 12:03 PM  Result Value Ref Range   Glucose-Capillary 192 (H) 70 - 99 mg/dL    Comment: Glucose reference range applies only to samples taken after fasting for at least 8 hours.  Glucose, capillary     Status: Abnormal   Collection Time: 12/24/20  4:28 PM  Result Value Ref Range   Glucose-Capillary 170 (H) 70 -  99 mg/dL    Comment: Glucose reference range applies only to samples taken after fasting for at least 8 hours.  Glucose, capillary     Status: Abnormal   Collection Time: 12/24/20  8:40 PM  Result Value Ref Range   Glucose-Capillary 171 (H) 70 - 99 mg/dL    Comment: Glucose reference range applies only to samples taken after fasting for at least 8 hours.  Protime-INR     Status: Abnormal   Collection Time: 12/25/20  5:10 AM  Result Value Ref Range   Prothrombin Time 17.9 (H) 11.4 - 15.2 seconds   INR 1.5 (H) 0.8 - 1.2    Comment: (NOTE) INR goal varies based on device and disease states. Performed at [facility], [address] [place], [place] [address]   CBC     Status: Abnormal   Collection Time: 12/25/20  5:10 AM  Result Value Ref Range   WBC 8.0 4.0 - 10.5  K/uL   RBC 3.80 (L) 3.87 - 5.11 MIL/uL   Hemoglobin 9.3 (L) 12.0 - 15.0 g/dL   HCT 30.6 (L) 36.0 - 46.0 %   MCV 80.5 80.0 - 100.0 fL   MCH 24.5 (L) 26.0 - 34.0 pg   MCHC 30.4 30.0 - 36.0 g/dL   RDW 22.4 (H) 11.5 - 15.5 %   Platelets 218 150 - 400 K/uL   nRBC 0.0 0.0 - 0.2 %    Comment: Performed at [facility], [address] [place], [place] [address]  Basic metabolic panel     Status: Abnormal   Collection Time: 12/25/20  5:10 AM  Result Value Ref Range   Sodium 139 135 - 145 mmol/L   Potassium 4.2 3.5 - 5.1 mmol/L   Chloride 109 98 - 111 mmol/L   CO2 24 22 - 32 mmol/L   Glucose, Bld 133 (H) 70 - 99 mg/dL    Comment: Glucose reference range applies only to samples taken after fasting for at least 8 hours.   BUN 19 8 - 23 mg/dL   Creatinine, Ser 1.95 (H) 0.44 - 1.00 mg/dL   Calcium 8.4 (L) 8.9 - 10.3 mg/dL   GFR, Estimated 26 (L) >60 mL/min    Comment: (NOTE) Calculated using the CKD-EPI Creatinine Equation (2021)    Anion gap 6 5 - 15    Comment: Performed at [facility], [address] [place], [place] [address]  Digoxin level     Status: None   Collection Time: 12/25/20   5:10 AM  Result Value Ref Range   Digoxin Level 1.5 0.8 - 2.0 ng/mL    Comment: Performed at [facility], [place], [place] [address]  Folate, serum, performed at [facility] lab     Status: None   Collection Time: 12/25/20  5:10 AM  Result Value Ref Range   Folate 10.9 >5.9 ng/mL    Comment: Performed at [facility], [address] [place], [place] [address]  Glucose, capillary     Status: Abnormal   Collection Time: 12/25/20  7:45 AM  Result Value Ref Range   Glucose-Capillary 135 (H) 70 - 99 mg/dL    Comment: Glucose reference range applies only to samples taken after fasting for at least 8 hours.  Glucose, capillary     Status: Abnormal   Collection Time: 12/25/20 11:31 AM  Result Value Ref Range   Glucose-Capillary 163 (H) 70 - 99 mg/dL    Comment: Glucose reference range applies only to samples taken after fasting for at least 8 hours.  Glucose, capillary     Status: Abnormal   Collection Time: 12/25/20  4:27 PM  Result Value Ref Range   Glucose-Capillary 118 (H) 70 - 99 mg/dL    Comment: Glucose reference range applies only to samples taken after fasting for at least 8 hours.  Glucose, capillary     Status: Abnormal   Collection Time: 12/25/20  8:56 PM  Result Value Ref Range   Glucose-Capillary 122 (H) 70 - 99 mg/dL    Comment: Glucose reference range applies only to samples taken after fasting for at least 8 hours.  Protime-INR     Status: Abnormal   Collection Time: 12/26/20  5:39 AM  Result Value Ref Range   Prothrombin Time 20.1 (H) 11.4 - 15.2 seconds   INR 1.8 (H) 0.8 - 1.2    Comment: (NOTE) INR goal varies based on device and disease states. Performed  at [facility], [address] [place], [place] [address]   CBC     Status: Abnormal   Collection Time: 12/26/20  5:39 AM  Result Value Ref Range   WBC 8.5 4.0 - 10.5 K/uL   RBC 3.90 3.87 - 5.11 MIL/uL   Hemoglobin 9.8 (L) 12.0 - 15.0 g/dL   HCT 31.4 (L) 36.0  - 46.0 %   MCV 80.5 80.0 - 100.0 fL   MCH 25.1 (L) 26.0 - 34.0 pg   MCHC 31.2 30.0 - 36.0 g/dL   RDW 22.5 (H) 11.5 - 15.5 %   Platelets 198 150 - 400 K/uL   nRBC 0.0 0.0 - 0.2 %    Comment: Performed at [facility], [address] [place], [place] [address]  Basic metabolic panel     Status: Abnormal   Collection Time: 12/26/20  5:39 AM  Result Value Ref Range   Sodium 139 135 - 145 mmol/L   Potassium 4.0 3.5 - 5.1 mmol/L   Chloride 109 98 - 111 mmol/L   CO2 24 22 - 32 mmol/L   Glucose, Bld 138 (H) 70 - 99 mg/dL    Comment: Glucose reference range applies only to samples taken after fasting for at least 8 hours.   BUN 21 8 - 23 mg/dL   Creatinine, Ser 2.02 (H) 0.44 - 1.00 mg/dL   Calcium 8.5 (L) 8.9 - 10.3 mg/dL   GFR, Estimated 25 (L) >60 mL/min    Comment: (NOTE) Calculated using the CKD-EPI Creatinine Equation (2021)    Anion gap 6 5 - 15    Comment: Performed at [facility], [address] [place], [place] [address]  Glucose, capillary     Status: Abnormal   Collection Time: 12/26/20  8:05 AM  Result Value Ref Range   Glucose-Capillary 138 (H) 70 - 99 mg/dL    Comment: Glucose reference range applies only to samples taken after fasting for at least 8 hours.  Protime-INR     Status: Abnormal   Collection Time: 12/27/20  5:30 AM  Result Value Ref Range   Prothrombin Time 22.4 (H) 11.4 - 15.2 seconds   INR 2.0 (H) 0.8 - 1.2    Comment: (NOTE) INR goal varies based on device and disease states. Performed at [facility], [address] [place], [place] [address]   CBC     Status: Abnormal   Collection Time: 12/27/20  5:30 AM  Result Value Ref Range   WBC 9.0 4.0 - 10.5 K/uL   RBC 3.69 (L) 3.87 - 5.11 MIL/uL   Hemoglobin 9.1 (L) 12.0 - 15.0 g/dL   HCT 30.1 (L) 36.0 - 46.0 %   MCV 81.6 80.0 - 100.0 fL   MCH 24.7 (L) 26.0 - 34.0 pg   MCHC 30.2 30.0 - 36.0 g/dL   RDW 22.7 (H) 11.5 - 15.5 %   Platelets 201 150 - 400 K/uL   nRBC 0.0  0.0 - 0.2 %    Comment: Performed at [facility], [address] [place], [place] [address]  Basic metabolic panel     Status: Abnormal   Collection Time: 12/27/20  5:30 AM  Result Value Ref Range   Sodium 137 135 - 145 mmol/L   Potassium 4.3 3.5 - 5.1 mmol/L   Chloride 106 98 - 111 mmol/L   CO2 23 22 - 32 mmol/L   Glucose, Bld 129 (H) 70 - 99 mg/dL    Comment: Glucose reference  range applies only to samples taken after fasting for at least 8 hours.   BUN 23 8 - 23 mg/dL   Creatinine, Ser 2.05 (H) 0.44 - 1.00 mg/dL   Calcium 8.2 (L) 8.9 - 10.3 mg/dL   GFR, Estimated 24 (L) >60 mL/min    Comment: (NOTE) Calculated using the CKD-EPI Creatinine Equation (2021)    Anion gap 8 5 - 15    Comment: Performed at [facility], [address] [place], [place] [address]  Protime-INR     Status: Abnormal   Collection Time: 12/28/20  5:41 AM  Result Value Ref Range   Prothrombin Time 25.2 (H) 11.4 - 15.2 seconds   INR 2.4 (H) 0.8 - 1.2    Comment: (NOTE) INR goal varies based on device and disease states. Performed at [facility], [address] [place], [place] [address]   Basic metabolic panel     Status: Abnormal   Collection Time: 12/28/20  5:41 AM  Result Value Ref Range   Sodium 137 135 - 145 mmol/L   Potassium 3.9 3.5 - 5.1 mmol/L   Chloride 107 98 - 111 mmol/L   CO2 23 22 - 32 mmol/L   Glucose, Bld 124 (H) 70 - 99 mg/dL    Comment: Glucose reference range applies only to samples taken after fasting for at least 8 hours.   BUN 26 (H) 8 - 23 mg/dL   Creatinine, Ser 2.19 (H) 0.44 - 1.00 mg/dL   Calcium 8.3 (L) 8.9 - 10.3 mg/dL   GFR, Estimated 22 (L) >60 mL/min    Comment: (NOTE) Calculated using the CKD-EPI Creatinine Equation (2021)    Anion gap 7 5 - 15    Comment: Performed at [facility], [address] [place], [place] [address]  Protime-INR     Status: Abnormal   Collection Time: 12/29/20  7:05 AM  Result Value Ref Range    Prothrombin Time 27.0 (H) 11.4 - 15.2 seconds   INR 2.6 (H) 0.8 - 1.2    Comment: (NOTE) INR goal varies based on device and disease states. Performed at [facility], [address] [place], [place] [address]   Basic metabolic panel     Status: Abnormal   Collection Time: 12/29/20  7:05 AM  Result Value Ref Range   Sodium 137 135 - 145 mmol/L   Potassium 4.0 3.5 - 5.1 mmol/L   Chloride 107 98 - 111 mmol/L   CO2 23 22 - 32 mmol/L   Glucose, Bld 129 (H) 70 - 99 mg/dL    Comment: Glucose reference range applies only to samples taken after fasting for at least 8 hours.   BUN 25 (H) 8 - 23 mg/dL   Creatinine, Ser 2.09 (H) 0.44 - 1.00 mg/dL   Calcium 8.3 (L) 8.9 - 10.3 mg/dL   GFR, Estimated 24 (L) >60 mL/min    Comment: (NOTE) Calculated using the CKD-EPI Creatinine Equation (2021)    Anion gap 7 5 - 15    Comment: Performed at [facility], [address] [place], [place] [address]  CBC     Status: Abnormal   Collection Time: 12/29/20  7:05 AM  Result Value Ref Range   WBC 8.3 4.0 - 10.5 K/uL   RBC 3.91 3.87 - 5.11 MIL/uL   Hemoglobin 9.8 (L) 12.0 - 15.0 g/dL   HCT 31.7 (L) 36.0 - 46.0 %   MCV 81.1 80.0 - 100.0 fL   MCH 25.1 (L)  26.0 - 34.0 pg   MCHC 30.9 30.0 - 36.0 g/dL   RDW 23.1 (H) 11.5 - 15.5 %   Platelets 220 150 - 400 K/uL   nRBC 0.0 0.0 - 0.2 %    Comment: Performed at [facility], [address] [place], [place] [address]  Protime-INR     Status: None   Collection Time: 01/11/21 12:00 AM  Result Value Ref Range   INR    CBC with Differential/Platelet     Status: Abnormal   Collection Time: 01/20/21 10:14 AM  Result Value Ref Range   WBC 7.5 3.4 - 10.8 x10E3/uL   RBC 3.73 (L) 3.77 - 5.28 x10E6/uL   Hemoglobin 9.8 (L) 11.1 - 15.9 g/dL   Hematocrit 31.5 (L) 34.0 - 46.6 %   MCV 85 79 - 97 fL   MCH 26.3 (L) 26.6 - 33.0 pg   MCHC 31.1 (L) 31.5 - 35.7 g/dL   RDW 22.3 (H) 11.7 - 15.4 %   Platelets 225 150 - 450 x10E3/uL    Neutrophils 59 Not Estab. %   Lymphs 31 Not Estab. %   Monocytes 6 Not Estab. %   Eos 2 Not Estab. %   Basos 1 Not Estab. %   Neutrophils Absolute 4.4 1.4 - 7.0 x10E3/uL   Lymphocytes Absolute 2.3 0.7 - 3.1 x10E3/uL   Monocytes Absolute 0.5 0.1 - 0.9 x10E3/uL   EOS (ABSOLUTE) 0.2 0.0 - 0.4 x10E3/uL   Basophils Absolute 0.1 0.0 - 0.2 x10E3/uL   Immature Granulocytes 1 Not Estab. %   Immature Grans (Abs) 0.1 0.0 - 0.1 x10E3/uL   Hematology Comments: Note:     Comment: Verified by microscopic examination.  Fe+TIBC+Fer     Status: Abnormal   Collection Time: 01/20/21 10:14 AM  Result Value Ref Range   Total Iron Binding Capacity 263 250 - 450 ug/dL   UIBC 226 118 - 369 ug/dL   Iron 37 27 - 139 ug/dL   Iron Saturation 14 (L) 15 - 55 %   Ferritin 47 15 - 150 ng/mL  Basic Metabolic Panel (BMET)     Status: Abnormal   Collection Time: 01/20/21 10:14 AM  Result Value Ref Range   Glucose 128 (H) 65 - 99 mg/dL   BUN 22 8 - 27 mg/dL   Creatinine, Ser 2.63 (H) 0.57 - 1.00 mg/dL   eGFR 18 (L) >59 mL/min/1.73   BUN/Creatinine Ratio 8 (L) 12 - 28   Sodium 139 134 - 144 mmol/L   Potassium 5.3 (H) 3.5 - 5.2 mmol/L   Chloride 107 (H) 96 - 106 mmol/L   CO2 18 (L) 20 - 29 mmol/L   Calcium 8.8 8.7 - 10.3 mg/dL  Protime-INR     Status: None   Collection Time: 01/21/21 12:00 AM  Result Value Ref Range   INR    Protime-INR     Status: None   Collection Time: 02/01/21 12:00 AM  Result Value Ref Range   INR    CBC with Differential/Platelet     Status: Abnormal   Collection Time: 02/06/21  5:03 AM  Result Value Ref Range   WBC 8.6 4.0 - 10.5 K/uL   RBC 3.22 (L) 3.87 - 5.11 MIL/uL   Hemoglobin 9.0 (L) 12.0 - 15.0 g/dL   HCT 27.0 (L) 36.0 - 46.0 %   MCV 83.9 80.0 - 100.0 fL   MCH 28.0 26.0 - 34.0 pg   MCHC 33.3 30.0 - 36.0 g/dL   RDW  22.8 (H) 11.5 - 15.5 %   Platelets 237 150 - 400 K/uL   nRBC 0.0 0.0 - 0.2 %   Neutrophils Relative % 69 %   Neutro Abs 6.0 1.7 - 7.7 K/uL   Lymphocytes  Relative 20 %   Lymphs Abs 1.8 0.7 - 4.0 K/uL   Monocytes Relative 8 %   Monocytes Absolute 0.7 0.1 - 1.0 K/uL   Eosinophils Relative 1 %   Eosinophils Absolute 0.1 0.0 - 0.5 K/uL   Basophils Relative 1 %   Basophils Absolute 0.1 0.0 - 0.1 K/uL   Immature Granulocytes 1 %   Abs Immature Granulocytes 0.08 (H) 0.00 - 0.07 K/uL    Comment: Performed at [facility], [address] [place], [place] [address]  Basic metabolic panel     Status: Abnormal   Collection Time: 02/06/21  5:03 AM  Result Value Ref Range   Sodium 136 135 - 145 mmol/L   Potassium 5.4 (H) 3.5 - 5.1 mmol/L   Chloride 110 98 - 111 mmol/L   CO2 18 (L) 22 - 32 mmol/L   Glucose, Bld 133 (H) 70 - 99 mg/dL    Comment: Glucose reference range applies only to samples taken after fasting for at least 8 hours.   BUN 30 (H) 8 - 23 mg/dL   Creatinine, Ser 2.74 (H) 0.44 - 1.00 mg/dL   Calcium 8.7 (L) 8.9 - 10.3 mg/dL   GFR, Estimated 17 (L) >60 mL/min    Comment: (NOTE) Calculated using the CKD-EPI Creatinine Equation (2021)    Anion gap 8 5 - 15    Comment: Performed at [facility], [place], [place] [address]  Troponin I (High Sensitivity)     Status: None   Collection Time: 02/06/21  5:03 AM  Result Value Ref Range   Troponin I (High Sensitivity) 12 <18 ng/L    Comment: (NOTE) Elevated high sensitivity troponin I (hsTnI) values and significant  changes across serial measurements may suggest ACS but many other  chronic and acute conditions are known to elevate hsTnI results.  Refer to the "Links" section for chest pain algorithms and additional  guidance. Performed at [facility], [address] [place], [place] [address]   Brain natriuretic peptide     Status: Abnormal   Collection Time: 02/06/21  5:03 AM  Result Value Ref Range   B Natriuretic Peptide 233.7 (H) 0.0 - 100.0 pg/mL    Comment: Performed at [facility], [address] [place], [place]  [address]  Resp Panel by RT-PCR (Flu A&B, Covid) Nasopharyngeal Swab     Status: None   Collection Time: 02/06/21  5:03 AM   Specimen: Nasopharyngeal Swab; Nasopharyngeal(NP) swabs in vial transport medium  Result Value Ref Range   SARS Coronavirus 2 by RT PCR NEGATIVE NEGATIVE    Comment: (NOTE) SARS-CoV-2 target nucleic acids are NOT DETECTED.  The SARS-CoV-2 RNA is generally detectable in upper respiratory specimens during the acute phase of infection. The lowest concentration of SARS-CoV-2 viral copies this assay can detect is 138 copies/mL. A negative result does not preclude SARS-Cov-2 infection and should not be used as the sole basis for treatment or other patient management decisions. A negative result may occur with  improper specimen collection/handling, submission of specimen other than nasopharyngeal swab, presence of viral mutation(s) within the areas targeted by this assay, and inadequate number of viral copies(<138 copies/mL). A negative result must be combined with clinical observations, patient  history, and epidemiological information. The expected result is Negative.  Fact Sheet for Patients:  EntrepreneurPulse.com.au  Fact Sheet for Healthcare Providers:  IncredibleEmployment.be  This test is no t yet approved or cleared by the [place] FDA and  has been authorized for detection and/or diagnosis of SARS-CoV-2 by FDA under an Emergency Use Authorization (EUA). This EUA will remain  in effect (meaning this test can be used) for the duration of the COVID-19 declaration under Section 564(b)(1) of the Act, 21 U.S.C.section 360bbb-3(b)(1), unless the authorization is terminated  or revoked sooner.       Influenza A by PCR NEGATIVE NEGATIVE   Influenza B by PCR NEGATIVE NEGATIVE    Comment: (NOTE) The Xpert Xpress SARS-CoV-2/FLU/RSV plus assay is intended as an aid in the diagnosis of influenza from Nasopharyngeal swab  specimens and should not be used as a sole basis for treatment. Nasal washings and aspirates are unacceptable for Xpert Xpress SARS-CoV-2/FLU/RSV testing.  Fact Sheet for Patients: EntrepreneurPulse.com.au  Fact Sheet for Healthcare Providers: IncredibleEmployment.be  This test is not yet approved or cleared by the [place] FDA and has been authorized for detection and/or diagnosis of SARS-CoV-2 by FDA under an Emergency Use Authorization (EUA). This EUA will remain in effect (meaning this test can be used) for the duration of the COVID-19 declaration under Section 564(b)(1) of the Act, 21 U.S.C. section 360bbb-3(b)(1), unless the authorization is terminated or revoked.  Performed at [facility], [address] [place], [place] [address]   Hepatitis B surface antigen     Status: None   Collection Time: 02/06/21  5:03 AM  Result Value Ref Range   Hepatitis B Surface Ag NON REACTIVE NON REACTIVE    Comment: Performed at [place] [address], [place], [place] [address]  Troponin I (High Sensitivity)     Status: None   Collection Time: 02/06/21  7:44 AM  Result Value Ref Range   Troponin I (High Sensitivity) 11 <18 ng/L    Comment: (NOTE) Elevated high sensitivity troponin I (hsTnI) values and significant  changes across serial measurements may suggest ACS but many other  chronic and acute conditions are known to elevate hsTnI results.  Refer to the "Links" section for chest pain algorithms and additional  guidance. Performed at [facility], [place], [place] [address]   Respiratory (~20 pathogens) panel by PCR     Status: Abnormal   Collection Time: 02/06/21  9:57 AM   Specimen: Nasopharyngeal Swab; Respiratory  Result Value Ref Range   Adenovirus NOT DETECTED NOT DETECTED   Coronavirus 229E NOT DETECTED NOT DETECTED    Comment: (NOTE) The Coronavirus on the Respiratory Panel, DOES  NOT test for the novel  Coronavirus (2019 nCoV)    Coronavirus HKU1 NOT DETECTED NOT DETECTED   Coronavirus NL63 NOT DETECTED NOT DETECTED   Coronavirus OC43 NOT DETECTED NOT DETECTED   Metapneumovirus NOT DETECTED NOT DETECTED   Rhinovirus / Enterovirus DETECTED (A) NOT DETECTED   Influenza A NOT DETECTED NOT DETECTED   Influenza B NOT DETECTED NOT DETECTED   Parainfluenza Virus 1 NOT DETECTED NOT DETECTED   Parainfluenza Virus 2 NOT DETECTED NOT DETECTED   Parainfluenza Virus 3 NOT DETECTED NOT DETECTED   Parainfluenza Virus 4 NOT DETECTED NOT DETECTED   Respiratory Syncytial Virus NOT DETECTED NOT DETECTED   Bordetella pertussis NOT DETECTED NOT DETECTED   Bordetella Parapertussis NOT DETECTED NOT DETECTED   Chlamydophila pneumoniae NOT DETECTED NOT  DETECTED   Mycoplasma pneumoniae NOT DETECTED NOT DETECTED    Comment: Performed at [facility], [place] [address], [place], [place] [address]  Digoxin level     Status: Abnormal   Collection Time: 02/06/21 10:38 AM  Result Value Ref Range   Digoxin Level 2.6 (HH) 0.8 - 2.0 ng/mL    Comment: CRITICAL RESULT CALLED TO, READ BACK BY AND VERIFIED WITH ASHLEY MURRAY '@[id number]'  02/06/21 MJU Performed at [facility], [address] [place], [place] [address]   CBG monitoring, ED     Status: Abnormal   Collection Time: 02/06/21 11:44 AM  Result Value Ref Range   Glucose-Capillary 274 (H) 70 - 99 mg/dL    Comment: Glucose reference range applies only to samples taken after fasting for at least 8 hours.  Protein / creatinine ratio, urine     Status: Abnormal   Collection Time: 02/06/21 11:47 AM  Result Value Ref Range   Creatinine, Urine 117 mg/dL   Total Protein, Urine 166 mg/dL    Comment: RESULT CONFIRMED BY MANUAL DILUTION MJU NO NORMAL RANGE ESTABLISHED FOR THIS TEST    Protein Creatinine Ratio 1.42 (H) 0.00 - 0.15 mg/mg[Cre]    Comment: Performed at [facility], [address] [place], [place] [address]   Protein electrophoresis, serum     Status: Abnormal   Collection Time: 02/06/21 11:47 AM  Result Value Ref Range   Total Protein ELP 7.0 6.0 - 8.5 g/dL   Albumin ELP 2.9 2.9 - 4.4 g/dL   Alpha-1-Globulin 0.3 0.0 - 0.4 g/dL   Alpha-2-Globulin 0.5 0.4 - 1.0 g/dL   Beta Globulin 1.6 (H) 0.7 - 1.3 g/dL   Gamma Globulin 1.7 0.4 - 1.8 g/dL   M-Spike, % Not Observed Not Observed g/dL   SPE Interp. Comment     Comment: (NOTE) The SPE pattern demonstrates an increase in the beta fraction. This may be due to increases in transferrin, beta-lipoprotein (hypercholesterolemia), or immunoglobulins, as seen in polyclonal or monoclonal gammopathies. If clinically indicated, the presence of a monoclonal gammopathy may be confirmed by immunofixation or serum free light chain quantitation. Performed At: [facility] [place], [place] [id number] Rush Farmer MD [id number]    Comment Comment     Comment: (NOTE) Protein electrophoresis scan will follow via computer, mail, or courier delivery.    Globulin, Total 4.1 (H) 2.2 - 3.9 g/dL   A/G Ratio 0.7 0.7 - 1.7  Protein Electro, Random Urine     Status: None   Collection Time: 02/06/21 11:47 AM  Result Value Ref Range   Total Protein, Urine 164.8 Not Estab. mg/dL   Albumin ELP, Urine 62.8 %   Alpha-1-Globulin, U 1.0 %   Alpha-2-Globulin, U 3.3 %   Beta Globulin, U 11.0 %   Gamma Globulin, U 22.0 %   M Component, Ur Not Observed Not Observed %   Please Note: Comment     Comment: (NOTE) Protein electrophoresis scan will follow via computer, mail, or courier delivery. Performed At: [facility] [place], [place] [id number] Rush Farmer MD [id number]   Kappa/lambda light chains     Status: Abnormal   Collection Time: 02/06/21 11:47 AM  Result Value Ref Range   Kappa free light chain 205.6 (H) 3.3 - 19.4 mg/L   Lamda free light chains 104.2 (H) 5.7 - 26.3 mg/L   Kappa, lamda light chain  ratio 1.97 (H) 0.26 - 1.65    Comment: (NOTE)  Performed At: [facility] [place], [place] [id number] Rush Farmer MD [id number]   Hepatitis C antibody     Status: None   Collection Time: 02/06/21 11:47 AM  Result Value Ref Range   HCV Ab NON REACTIVE NON REACTIVE    Comment: (NOTE) Nonreactive HCV antibody screen is consistent with no HCV infections,  unless recent infection is suspected or other evidence exists to indicate HCV infection.  Performed at [facility], [place] [address] [place], [place] [address]   Hepatitis B surface antibody,qualitative     Status: None   Collection Time: 02/06/21 11:47 AM  Result Value Ref Range   Hep B S Ab NON REACTIVE NON REACTIVE    Comment: (NOTE) Inconsistent with immunity, less than 10 mIU/mL.  Performed at [facility], [place] [address], [place], [place] [address]   Hepatitis B core antibody, IgM     Status: None   Collection Time: 02/06/21 11:47 AM  Result Value Ref Range   Hep B C IgM NON REACTIVE NON REACTIVE    Comment: Performed at [place] [address] [place], [place] [address]  Iron and TIBC     Status: Abnormal   Collection Time: 02/06/21 11:47 AM  Result Value Ref Range   Iron 19 (L) 28 - 170 ug/dL   TIBC 280 250 - 450 ug/dL   Saturation Ratios 7 (L) 10.4 - 31.8 %   UIBC 261 ug/dL    Comment: Performed at [facility], [address] [place], [place] [address]  Ferritin     Status: None   Collection Time: 02/06/21 11:47 AM  Result Value Ref Range   Ferritin 32 11 - 307 ng/mL    Comment: Performed at [facility], [address], [place], [place] [address]  Parathyroid hormone, intact (no Ca)     Status: None   Collection Time: 02/06/21 11:47 AM  Result Value Ref Range   PTH 40 15 - 65 pg/mL    Comment: (NOTE) Performed At: [facility] [place], [place] [id number] Rush Farmer MD [id number]   Phosphorus     Status:  Abnormal   Collection Time: 02/06/21 11:47 AM  Result Value Ref Range   Phosphorus 5.2 (H) 2.5 - 4.6 mg/dL    Comment: Performed at [facility], [address] [place], [place] [address]  Vitamin B12     Status: None   Collection Time: 02/06/21 11:47 AM  Result Value Ref Range   Vitamin B-12 744 180 - 914 pg/mL    Comment: (NOTE) This assay is not validated for testing neonatal or myeloproliferative syndrome specimens for Vitamin B12 levels. Performed at [facility], [place] [address], [place], [place] [address]   Folate, serum, performed at [facility] lab     Status: None   Collection Time: 02/06/21 11:47 AM  Result Value Ref Range   Folate 16.8 >5.9 ng/mL    Comment: Performed at [facility], [address] [place], [place] [address]  Protime-INR     Status: Abnormal   Collection Time: 02/06/21  1:08 PM  Result Value Ref Range   Prothrombin Time 18.7 (H) 11.4 - 15.2 seconds   INR 1.6 (H) 0.8 - 1.2    Comment: (NOTE) INR goal varies based on device and disease states. Performed at [facility], [address], [place], [place] [address]   CBG monitoring, ED  Status: Abnormal   Collection Time: 02/06/21  6:47 PM  Result Value Ref Range   Glucose-Capillary 158 (H) 70 - 99 mg/dL    Comment: Glucose reference range applies only to samples taken after fasting for at least 8 hours.  Basic metabolic panel     Status: Abnormal   Collection Time: 02/07/21  6:51 AM  Result Value Ref Range   Sodium 134 (L) 135 - 145 mmol/L   Potassium 5.2 (H) 3.5 - 5.1 mmol/L   Chloride 107 98 - 111 mmol/L   CO2 17 (L) 22 - 32 mmol/L   Glucose, Bld 144 (H) 70 - 99 mg/dL    Comment: Glucose reference range applies only to samples taken after fasting for at least 8 hours.   BUN 43 (H) 8 - 23 mg/dL   Creatinine, Ser 3.19 (H) 0.44 - 1.00 mg/dL   Calcium 8.5 (L) 8.9 - 10.3 mg/dL   GFR, Estimated 14 (L) >60 mL/min    Comment: (NOTE) Calculated using the  CKD-EPI Creatinine Equation (2021)    Anion gap 10 5 - 15    Comment: Performed at [facility], [address] [place], [place] [address]  CBG monitoring, ED     Status: Abnormal   Collection Time: 02/07/21  8:46 AM  Result Value Ref Range   Glucose-Capillary 124 (H) 70 - 99 mg/dL    Comment: Glucose reference range applies only to samples taken after fasting for at least 8 hours.  CBG monitoring, ED     Status: Abnormal   Collection Time: 02/07/21  2:00 PM  Result Value Ref Range   Glucose-Capillary 121 (H) 70 - 99 mg/dL    Comment: Glucose reference range applies only to samples taken after fasting for at least 8 hours.  CBG monitoring, ED     Status: Abnormal   Collection Time: 02/07/21  4:56 PM  Result Value Ref Range   Glucose-Capillary 108 (H) 70 - 99 mg/dL    Comment: Glucose reference range applies only to samples taken after fasting for at least 8 hours.  CBG monitoring, ED     Status: Abnormal   Collection Time: 02/07/21  9:35 PM  Result Value Ref Range   Glucose-Capillary 177 (H) 70 - 99 mg/dL    Comment: Glucose reference range applies only to samples taken after fasting for at least 8 hours.  Basic metabolic panel     Status: Abnormal   Collection Time: 02/08/21  5:16 AM  Result Value Ref Range   Sodium 135 135 - 145 mmol/L   Potassium 4.4 3.5 - 5.1 mmol/L   Chloride 104 98 - 111 mmol/L   CO2 23 22 - 32 mmol/L   Glucose, Bld 176 (H) 70 - 99 mg/dL    Comment: Glucose reference range applies only to samples taken after fasting for at least 8 hours.   BUN 50 (H) 8 - 23 mg/dL   Creatinine, Ser 3.10 (H) 0.44 - 1.00 mg/dL   Calcium 8.0 (L) 8.9 - 10.3 mg/dL   GFR, Estimated 15 (L) >60 mL/min    Comment: (NOTE) Calculated using the CKD-EPI Creatinine Equation (2021)    Anion gap 8 5 - 15    Comment: Performed at [facility], [address] [place], [place] [address]  CBC with Differential/Platelet     Status: Abnormal   Collection Time: 02/08/21   5:16 AM  Result Value Ref Range   WBC 12.2 (H) 4.0 - 10.5 K/uL  RBC 2.78 (L) 3.87 - 5.11 MIL/uL   Hemoglobin 7.7 (L) 12.0 - 15.0 g/dL   HCT 22.7 (L) 36.0 - 46.0 %   MCV 81.7 80.0 - 100.0 fL   MCH 27.7 26.0 - 34.0 pg   MCHC 33.9 30.0 - 36.0 g/dL   RDW 22.7 (H) 11.5 - 15.5 %   Platelets 228 150 - 400 K/uL   nRBC 0.0 0.0 - 0.2 %   Neutrophils Relative % 87 %   Neutro Abs 10.7 (H) 1.7 - 7.7 K/uL   Lymphocytes Relative 9 %   Lymphs Abs 1.0 0.7 - 4.0 K/uL   Monocytes Relative 3 %   Monocytes Absolute 0.3 0.1 - 1.0 K/uL   Eosinophils Relative 0 %   Eosinophils Absolute 0.0 0.0 - 0.5 K/uL   Basophils Relative 0 %   Basophils Absolute 0.0 0.0 - 0.1 K/uL   Immature Granulocytes 1 %   Abs Immature Granulocytes 0.12 (H) 0.00 - 0.07 K/uL    Comment: Performed at [facility], [address], [place], [place] [address]  Magnesium     Status: None   Collection Time: 02/08/21  5:16 AM  Result Value Ref Range   Magnesium 2.2 1.7 - 2.4 mg/dL    Comment: Performed at [facility], [address] [place], [place] [address]  Protime-INR     Status: Abnormal   Collection Time: 02/08/21  5:16 AM  Result Value Ref Range   Prothrombin Time 19.7 (H) 11.4 - 15.2 seconds   INR 1.7 (H) 0.8 - 1.2    Comment: (NOTE) INR goal varies based on device and disease states. Performed at [facility], [address] [place], [place] [address]   Procalcitonin     Status: None   Collection Time: 02/08/21  5:16 AM  Result Value Ref Range   Procalcitonin 0.10 ng/mL    Comment:        Interpretation: PCT (Procalcitonin) <= 0.5 ng/mL: Systemic infection (sepsis) is not likely. Local bacterial infection is possible. (NOTE)       Sepsis PCT Algorithm           Lower Respiratory Tract                                      Infection PCT Algorithm    ----------------------------     ----------------------------         PCT < 0.25 ng/mL                PCT < 0.10 ng/mL           Strongly encourage             Strongly discourage   discontinuation of antibiotics    initiation of antibiotics    ----------------------------     -----------------------------       PCT 0.25 - 0.50 ng/mL            PCT 0.10 - 0.25 ng/mL               OR       >80% decrease in PCT            Discourage initiation of  antibiotics      Encourage discontinuation           of antibiotics    ----------------------------     -----------------------------         PCT >= 0.50 ng/mL              PCT 0.26 - 0.50 ng/mL               AND        <80% decrease in PCT             Encourage initiation of                                             antibiotics       Encourage continuation           of antibiotics    ----------------------------     -----------------------------        PCT >= 0.50 ng/mL                  PCT > 0.50 ng/mL               AND         increase in PCT                  Strongly encourage                                      initiation of antibiotics    Strongly encourage escalation           of antibiotics                                     -----------------------------                                           PCT <= 0.25 ng/mL                                                 OR                                        > 80% decrease in PCT                                      Discontinue / Do not initiate                                             antibiotics  Performed at [facility], [address] [place], [place] [address]   Glucose, capillary     Status: Abnormal   Collection Time: 02/08/21  8:00 AM  Result Value Ref Range   Glucose-Capillary 166 (H) 70 - 99 mg/dL    Comment: Glucose reference range applies only to samples taken after fasting for at least 8 hours.  Glucose, capillary     Status: Abnormal   Collection Time: 02/08/21 11:50 AM  Result Value Ref Range   Glucose-Capillary 139 (H) 70 - 99 mg/dL     Comment: Glucose reference range applies only to samples taken after fasting for at least 8 hours.  Glucose, capillary     Status: Abnormal   Collection Time: 02/08/21 11:52 PM  Result Value Ref Range   Glucose-Capillary 207 (H) 70 - 99 mg/dL    Comment: Glucose reference range applies only to samples taken after fasting for at least 8 hours.  Basic metabolic panel     Status: Abnormal   Collection Time: 02/09/21  5:29 AM  Result Value Ref Range   Sodium 135 135 - 145 mmol/L   Potassium 4.1 3.5 - 5.1 mmol/L   Chloride 101 98 - 111 mmol/L   CO2 26 22 - 32 mmol/L   Glucose, Bld 161 (H) 70 - 99 mg/dL    Comment: Glucose reference range applies only to samples taken after fasting for at least 8 hours.   BUN 54 (H) 8 - 23 mg/dL   Creatinine, Ser 2.92 (H) 0.44 - 1.00 mg/dL   Calcium 7.8 (L) 8.9 - 10.3 mg/dL   GFR, Estimated 16 (L) >60 mL/min    Comment: (NOTE) Calculated using the CKD-EPI Creatinine Equation (2021)    Anion gap 8 5 - 15    Comment: Performed at [facility], [address] [place], [place] [address]  CBC with Differential/Platelet     Status: Abnormal   Collection Time: 02/09/21  5:29 AM  Result Value Ref Range   WBC 9.7 4.0 - 10.5 K/uL   RBC 2.82 (L) 3.87 - 5.11 MIL/uL   Hemoglobin 7.7 (L) 12.0 - 15.0 g/dL   HCT 23.3 (L) 36.0 - 46.0 %   MCV 82.6 80.0 - 100.0 fL   MCH 27.3 26.0 - 34.0 pg   MCHC 33.0 30.0 - 36.0 g/dL   RDW 22.6 (H) 11.5 - 15.5 %   Platelets 191 150 - 400 K/uL   nRBC 0.0 0.0 - 0.2 %   Neutrophils Relative % 78 %   Neutro Abs 7.6 1.7 - 7.7 K/uL   Lymphocytes Relative 15 %   Lymphs Abs 1.5 0.7 - 4.0 K/uL   Monocytes Relative 5 %   Monocytes Absolute 0.5 0.1 - 1.0 K/uL   Eosinophils Relative 0 %   Eosinophils Absolute 0.0 0.0 - 0.5 K/uL   Basophils Relative 0 %   Basophils Absolute 0.0 0.0 - 0.1 K/uL   Immature Granulocytes 2 %   Abs Immature Granulocytes 0.18 (H) 0.00 - 0.07 K/uL   Ovalocytes PRESENT     Comment: Performed at  [facility], [address] [place], [place] [address]  Magnesium     Status: None   Collection Time: 02/09/21  5:29 AM  Result Value Ref Range   Magnesium 2.1 1.7 - 2.4 mg/dL    Comment: Performed at [facility], [address] [place], [place] [address]  Protime-INR     Status: Abnormal   Collection Time: 02/09/21  7:40 AM  Result Value Ref Range   Prothrombin Time 19.7 (H) 11.4 - 15.2 seconds   INR 1.7 (H) 0.8 - 1.2  Comment: (NOTE) INR goal varies based on device and disease states. Performed at [facility], [address] [place], [place] [address]   Glucose, capillary     Status: Abnormal   Collection Time: 02/09/21  8:28 AM  Result Value Ref Range   Glucose-Capillary 146 (H) 70 - 99 mg/dL    Comment: Glucose reference range applies only to samples taken after fasting for at least 8 hours.  Glucose, capillary     Status: Abnormal   Collection Time: 02/09/21 12:36 PM  Result Value Ref Range   Glucose-Capillary 196 (H) 70 - 99 mg/dL    Comment: Glucose reference range applies only to samples taken after fasting for at least 8 hours.  Glucose, capillary     Status: Abnormal   Collection Time: 02/09/21  5:03 PM  Result Value Ref Range   Glucose-Capillary 252 (H) 70 - 99 mg/dL    Comment: Glucose reference range applies only to samples taken after fasting for at least 8 hours.  Glucose, capillary     Status: Abnormal   Collection Time: 02/09/21  8:23 PM  Result Value Ref Range   Glucose-Capillary 245 (H) 70 - 99 mg/dL    Comment: Glucose reference range applies only to samples taken after fasting for at least 8 hours.  Basic metabolic panel     Status: Abnormal   Collection Time: 02/10/21  4:54 AM  Result Value Ref Range   Sodium 136 135 - 145 mmol/L   Potassium 4.5 3.5 - 5.1 mmol/L   Chloride 101 98 - 111 mmol/L   CO2 26 22 - 32 mmol/L   Glucose, Bld 149 (H) 70 - 99 mg/dL    Comment: Glucose reference range applies only to samples  taken after fasting for at least 8 hours.   BUN 55 (H) 8 - 23 mg/dL   Creatinine, Ser 2.63 (H) 0.44 - 1.00 mg/dL   Calcium 8.1 (L) 8.9 - 10.3 mg/dL   GFR, Estimated 18 (L) >60 mL/min    Comment: (NOTE) Calculated using the CKD-EPI Creatinine Equation (2021)    Anion gap 9 5 - 15    Comment: Performed at [facility], [address] [place], [place] [address]  Protime-INR     Status: Abnormal   Collection Time: 02/10/21  4:54 AM  Result Value Ref Range   Prothrombin Time 20.3 (H) 11.4 - 15.2 seconds   INR 1.7 (H) 0.8 - 1.2    Comment: (NOTE) INR goal varies based on device and disease states. Performed at [facility], [address] [place], [place] [address]   CBC     Status: Abnormal   Collection Time: 02/10/21  4:54 AM  Result Value Ref Range   WBC 8.7 4.0 - 10.5 K/uL   RBC 3.05 (L) 3.87 - 5.11 MIL/uL   Hemoglobin 8.3 (L) 12.0 - 15.0 g/dL   HCT 25.1 (L) 36.0 - 46.0 %   MCV 82.3 80.0 - 100.0 fL   MCH 27.2 26.0 - 34.0 pg   MCHC 33.1 30.0 - 36.0 g/dL   RDW 22.5 (H) 11.5 - 15.5 %   Platelets 201 150 - 400 K/uL   nRBC 0.0 0.0 - 0.2 %    Comment: Performed at [facility], [address], [place], [place] [address]  Magnesium     Status: None   Collection Time: 02/10/21  4:54 AM  Result Value Ref Range   Magnesium 2.1 1.7 - 2.4 mg/dL  Comment: Performed at [facility], [address] [place], [place] [address]  Glucose, capillary     Status: Abnormal   Collection Time: 02/10/21  7:48 AM  Result Value Ref Range   Glucose-Capillary 149 (H) 70 - 99 mg/dL    Comment: Glucose reference range applies only to samples taken after fasting for at least 8 hours.  Glucose, capillary     Status: Abnormal   Collection Time: 02/10/21 12:13 PM  Result Value Ref Range   Glucose-Capillary 265 (H) 70 - 99 mg/dL    Comment: Glucose reference range applies only to samples taken after fasting for at least 8 hours.  Glucose, capillary     Status:  Abnormal   Collection Time: 02/10/21  4:22 PM  Result Value Ref Range   Glucose-Capillary 201 (H) 70 - 99 mg/dL    Comment: Glucose reference range applies only to samples taken after fasting for at least 8 hours.  POCT INR     Status: Abnormal   Collection Time: 02/15/21 11:02 AM  Result Value Ref Range   INR 1.5 (A) 2.0 - 3.0    Radiology: CT CHEST WO CONTRAST  Result Date: 02/06/2021 CLINICAL DATA:  Shortness of breath, congestion.  History of COPD. EXAM: CT CHEST WITHOUT CONTRAST TECHNIQUE: Multidetector CT imaging of the chest was performed following the standard protocol without IV contrast. COMPARISON:  Chest x-ray from earlier same day. Chest CT dated 01/03/2019. FINDINGS: Cardiovascular: Borderline cardiomegaly. Aortic atherosclerosis. No thoracic aortic aneurysm. No significant pericardial effusion. Coronary artery calcifications, particularly dense within the LEFT anterior descending coronary artery. Mediastinum/Nodes: Moderately enlarged lymph nodes within the mediastinum, including a 1.3 cm short axis lymph node within the pre-vascular space and a 1.2 cm short axis lymph node within the precarinal space. These lymph nodes are not significantly changed compared to the previous study suggesting benign/reactive lymphadenopathy. No new enlarged or morphologically abnormal lymph nodes. Esophagus is unremarkable.  Trachea is unremarkable. Lungs/Pleura: New bilateral pleural effusions, moderate-sized, with adjacent compressive atelectasis. Additional areas of subpleural reticulation and ground-glass opacities within the upper lobes, most compatible with chronic interstitial lung disease/fibrosis. No evidence of a superimposed consolidating pneumonia. Upper Abdomen: Limited images of the upper abdomen are unremarkable. Musculoskeletal: Degenerative spondylosis of the kyphotic thoracic spine, mild to moderate in degree. No acute appearing osseous abnormality. Ill-defined fluid/edema within the  subcutaneous soft tissues of the lower chest and upper abdomen indicating some degree of anasarca. IMPRESSION: 1. New bilateral pleural effusions, moderate-sized, with adjacent compressive atelectasis. 2. Additional areas of subpleural reticulation and ground-glass opacities within the bilateral upper lobes, most compatible with NSIP, hypersensitivity pneumonitis and/or respiratory bronchiolitis. No evidence of a superimposed consolidating pneumonia. 3. Coronary artery calcifications, particularly dense within the LEFT anterior descending coronary artery. Recommend correlation with any possible associated cardiac symptoms. 4. Anasarca. Aortic Atherosclerosis (ICD10-I70.0). Electronically Signed   By: Franki Cabot M.D.   On: 02/06/2021 09:18   US RENAL  Result Date: 02/06/2021 CLINICAL DATA:  Acute kidney failure.  History of right nephrectomy. EXAM: RENAL / URINARY TRACT ULTRASOUND COMPLETE COMPARISON:  None. FINDINGS: Right Kidney: Surgically absent.  The renal fossa is unremarkable. Left Kidney: Renal measurements: 10.2 x 5.7 x 4.9 cm = volume: 150 mL. Echogenicity within normal limits. No mass or hydronephrosis visualized. Bladder: Appears normal for degree of bladder distention. Left jet visualized. Other: Incidentally noted right pleural effusion. IMPRESSION: 1.  Right kidney is surgically absent. 2.  Unremarkable sonographic appearance of the left kidney. 3.  Incidentally noted right pleural effusion. Electronically Signed   By: Audie Pinto M.D.   On: 02/06/2021 12:58   DG Chest Portable 1 View  Result Date: 02/06/2021 CLINICAL DATA:  Shortness of breath and nasal congestion with wheezing EXAM: PORTABLE CHEST 1 VIEW COMPARISON:  12/23/2020 FINDINGS: Generalized interstitial prominence. There is chronic lung disease by prior imaging. Borderline heart size with stable mediastinal contours. Blunting of the costophrenic sulci similar to prior. No pneumothorax. IMPRESSION: Interstitial opacity  correlating with chronic lung disease on prior imaging. Superimposed pulmonary edema or atypical infection would easily be obscured. Electronically Signed   By: Monte Fantasia M.D.   On: 02/06/2021 05:30    No results found.  CT CHEST WO CONTRAST  Result Date: 02/06/2021 CLINICAL DATA:  Shortness of breath, congestion.  History of COPD. EXAM: CT CHEST WITHOUT CONTRAST TECHNIQUE: Multidetector CT imaging of the chest was performed following the standard protocol without IV contrast. COMPARISON:  Chest x-ray from earlier same day. Chest CT dated 01/03/2019. FINDINGS: Cardiovascular: Borderline cardiomegaly. Aortic atherosclerosis. No thoracic aortic aneurysm. No significant pericardial effusion. Coronary artery calcifications, particularly dense within the LEFT anterior descending coronary artery. Mediastinum/Nodes: Moderately enlarged lymph nodes within the mediastinum, including a 1.3 cm short axis lymph node within the pre-vascular space and a 1.2 cm short axis lymph node within the precarinal space. These lymph nodes are not significantly changed compared to the previous study suggesting benign/reactive lymphadenopathy. No new enlarged or morphologically abnormal lymph nodes. Esophagus is unremarkable.  Trachea is unremarkable. Lungs/Pleura: New bilateral pleural effusions, moderate-sized, with adjacent compressive atelectasis. Additional areas of subpleural reticulation and ground-glass opacities within the upper lobes, most compatible with chronic interstitial lung disease/fibrosis. No evidence of a superimposed consolidating pneumonia. Upper Abdomen: Limited images of the upper abdomen are unremarkable. Musculoskeletal: Degenerative spondylosis of the kyphotic thoracic spine, mild to moderate in degree. No acute appearing osseous abnormality. Ill-defined fluid/edema within the subcutaneous soft tissues of the lower chest and upper abdomen indicating some degree of anasarca. IMPRESSION: 1. New bilateral  pleural effusions, moderate-sized, with adjacent compressive atelectasis. 2. Additional areas of subpleural reticulation and ground-glass opacities within the bilateral upper lobes, most compatible with NSIP, hypersensitivity pneumonitis and/or respiratory bronchiolitis. No evidence of a superimposed consolidating pneumonia. 3. Coronary artery calcifications, particularly dense within the LEFT anterior descending coronary artery. Recommend correlation with any possible associated cardiac symptoms. 4. Anasarca. Aortic Atherosclerosis (ICD10-I70.0). Electronically Signed   By: Franki Cabot M.D.   On: 02/06/2021 09:18   US RENAL  Result Date: 02/06/2021 CLINICAL DATA:  Acute kidney failure.  History of right nephrectomy. EXAM: RENAL / URINARY TRACT ULTRASOUND COMPLETE COMPARISON:  None. FINDINGS: Right Kidney: Surgically absent.  The renal fossa is unremarkable. Left Kidney: Renal measurements: 10.2 x 5.7 x 4.9 cm = volume: 150 mL. Echogenicity within normal limits. No mass or hydronephrosis visualized. Bladder: Appears normal for degree of bladder distention. Left jet visualized. Other: Incidentally noted right pleural effusion. IMPRESSION: 1.  Right kidney is surgically absent. 2.  Unremarkable sonographic appearance of the left kidney. 3.  Incidentally noted right pleural effusion. Electronically Signed   By: Audie Pinto M.D.   On: 02/06/2021 12:58   DG Chest Portable 1 View  Result Date: 02/06/2021 CLINICAL DATA:  Shortness of breath and nasal congestion with wheezing EXAM: PORTABLE CHEST 1 VIEW COMPARISON:  12/23/2020 FINDINGS: Generalized interstitial prominence. There is chronic lung disease by prior imaging. Borderline heart size with stable mediastinal contours. Blunting of the costophrenic sulci similar to  prior. No pneumothorax. IMPRESSION: Interstitial opacity correlating with chronic lung disease on prior imaging. Superimposed pulmonary edema or atypical infection would easily be obscured.  Electronically Signed   By: Monte Fantasia M.D.   On: 02/06/2021 05:30      Assessment and Plan: Patient Active Problem List   Diagnosis Date Noted  . Acute renal failure superimposed on stage 4 chronic kidney disease ([place]) 02/08/2021  . Anemia associated with stage 4 chronic renal failure ([place]) 02/08/2021  . Anemia due to stage 5 chronic kidney disease ([place]) 02/07/2021  . Acute CHF (congestive heart failure) ([place]) 02/06/2021  . Acute on chronic diastolic CHF (congestive heart failure) ([place]) 02/06/2021  . Hyperkalemia 02/06/2021  . Weakness 12/24/2020  . Hypertensive urgency 12/23/2020  . Nicotine dependence 12/23/2020  . History of anemia due to chronic kidney disease 12/23/2020  . Pain due to onychomycosis of toenails of both feet 02/12/2020  . Uncontrolled type 2 diabetes mellitus with hyperglycemia ([place]) 05/20/2019  . Acute recurrent pansinusitis 05/11/2019  . Acute otitis media 05/11/2019  . Acute pain of left shoulder 05/11/2019  . Long term (current) use of anticoagulants 04/03/2019  . Chronic left shoulder pain 03/18/2019  . Sepsis ([place]) 01/12/2018  . Colitis 01/12/2018  . CKD (chronic kidney disease), stage III ([place]) 01/12/2018  . Diabetes ([place]) 12/28/2017  . AF (paroxysmal atrial fibrillation) ([place]) 12/28/2017  . Encounter for general adult medical examination with abnormal findings 12/28/2017  . Primary generalized (osteo)arthritis 12/28/2017  . Hypertension 12/27/2017  . GERD (gastroesophageal reflux disease) 12/27/2017  . Hematuria 03/18/2015  . Chronic combined systolic and diastolic heart failure ([place]) 03/01/2012  . Obesity 03/01/2012  . CKD stage 4 due to type 2 diabetes mellitus ([place]) 03/01/2012  . Other benign neoplasm of connective and other soft tissue of lower limb, including hip 03/15/2011  . Neoplasm of connective tissue 02/01/2011  . Pain in joint, pelvic region and thigh 02/01/2011  . Type 2 diabetes mellitus with stage 5 chronic kidney disease ([place])  10/03/2006  . Encounter for current long-term use of anticoagulants 02/07/2006  . Essential hypertension 09/24/2004  . Atrial fibrillation ([place]) 06/07/2004    1. Pulmonary fibrosis ([place]) CT scan is consistent with someone who would have pulmonary fibrosis with some peripheral fibrotic changes.  We discussed the possibility of medical management however based on her age and other comorbid conditions she would be at high risk for current antifibrotic therapy that is available.  I would suggest at this point to do a follow-up CT and monitor in addition to getting pulmonary functions to have a baseline of her lung function - Pulmonary function test; Future  2. Chronic diastolic congestive heart failure ([place]) Being seen by cardiology we will continue to follow-up with her primary cardiologist.  She had some effusions noted on the last CT I am going to get a follow-up CT scan for her fibrosis follow-up so we will be able to monitor this  3. Atrial fibrillation, unspecified type ([place]) Rate is controlled at this time again she will follow-up with her primary cardiologist for management  4. CKD stage 4 due to type 2 diabetes mellitus ([place]) Currently is compensated need to monitor her fluid status closely to ensure that she does not get dehydrated and may have worsening of her symptoms  General Counseling: I have discussed the findings of the evaluation and examination with [facility].  I have also discussed any further diagnostic evaluation thatmay be needed or ordered today. Diamone verbalizes understanding of the findings  of todays visit. We also reviewed her medications today and discussed drug interactions and side effects including but not limited excessive drowsiness and altered mental states. We also discussed that there is always a risk not just to her but also people around her. she has been encouraged to call the office with any questions or concerns that should arise related to todays visit.  Orders  Placed This Encounter  Procedures  . Pulmonary function test    Standing Status:   Future    Standing Expiration Date:   02/24/2022    Order Specific Question:   Where should this test be performed?    Answer:   [facility]     Time spent: 41  I have personally obtained a history, examined the patient, evaluated laboratory and imaging results, formulated the assessment and plan and placed orders.    Allyne Gee, MD [facility] Sleep medicine

## 2021-02-25 ENCOUNTER — Ambulatory Visit (INDEPENDENT_AMBULATORY_CARE_PROVIDER_SITE_OTHER): Payer: Medicare HMO

## 2021-02-25 DIAGNOSIS — Z7901 Long term (current) use of anticoagulants: Secondary | ICD-10-CM | POA: Diagnosis not present

## 2021-02-25 NOTE — Progress Notes (Signed)
Pt INR 1.2 as per lauren advised daughter to take 1 extra tab for coumadin 2 mg and go back to normal dosing coumadin 2 mg 1 tab po daily and repeat in 1 week

## 2021-02-27 NOTE — Progress Notes (Signed)
Patient ID: [name], female    DOB: [birth year], 80 y.o.   MRN: [id number]  HPI  Ms Bains is a 80 y/o female with a history of DM, hyperlipidemia, HTN, CKD, GERD, atrial fibrillation, anemia, pulmonary fibrosis, previous tobaccco use and chronic heart failure.   Echo report from 12/24/20 reviewed and showed an EF of 60-65% along with mild LAE and mild AR/AS.   Admitted 02/06/21 due to acute on chronic HF. Cardiology and nephrology consults obtained. Able to be weaned off of oxygen. Gentle hydration given. Given steroids for bronchospasm. IV iron given for anemia. Discharged after 4 days.   She presents today for her initial visit with a chief complaint of minimal shortness of breath upon moderate exertion. She says that this has been present for several months. She has associated fatigue, cough, chronic pain and easy bruising along with this. She denies any difficulty sleeping, dizziness, abdominal distention, palpitations, pedal edema, chest pain, wheezing or weight gain.   Has recently had digoxin, lisinopril and amlodipine stopped due to renal function (per patient & daughter).   Drinks some water during the day along with pepsi.    Past Medical History:  Diagnosis Date  . Anemia   . Atrial fibrillation ([place])   . Chronic kidney disease   . Congestive heart failure (CHF) ([place])   . Diabetes mellitus without complication ([place])   . GERD (gastroesophageal reflux disease)   . Hyperlipidemia   . Hypertension   . Pneumonia   . Pulmonary fibrosis ([place])    Past Surgical History:  Procedure Laterality Date  . ABDOMINAL HYSTERECTOMY    . APPENDECTOMY    . CATARACT EXTRACTION    . CHOLECYSTECTOMY    . HERNIA REPAIR    . right knee replacement    . right nephroectomy     Family History  Problem Relation Age of Onset  . Breast cancer Mother    Social History   Tobacco Use  . Smoking status: Former Smoker    Packs/day: 1.00    Types: Cigarettes    Quit date: 02/10/2021     Years since quitting: 0.0  . Smokeless tobacco: Never Used  Substance Use Topics  . Alcohol use: No   No Known Allergies   Prior to Admission medications   Medication Sig Start Date End Date Taking? Authorizing Provider  acetaminophen (TYLENOL) 325 MG tablet Take 650 mg by mouth every 6 (six) hours as needed.   Yes [provider]  atorvastatin (LIPITOR) 10 MG tablet Take 1 tablet (10 mg total) by mouth every evening. 12/06/20  Yes McDonough, Lauren K, PA-C  benzonatate (TESSALON) 200 MG capsule Take 1 capsule (200 mg total) by mouth 2 (two) times daily as needed for cough. 02/24/21  Yes Allyne Gee, MD  bisacodyl (DULCOLAX) 5 MG EC tablet Take 1 tablet (5 mg total) by mouth daily as needed for moderate constipation. 12/29/20  Yes Val Riles, MD  Blood Pressure Monitor KIT Use three times daily to check blood pressure  DX:  I50.42 01/13/21  Yes Lavera Guise, MD  citalopram (CELEXA) 40 MG tablet Take 1 tablet (40 mg total) by mouth daily. 10/20/20  Yes Lavera Guise, MD  donepezil (ARICEPT) 5 MG tablet TAKE 1 TABLET BY MOUTH ONCE DAILY FOR MEMORY Patient taking differently: Take 5 mg by mouth daily. TAKE 1 TABLET BY MOUTH ONCE DAILY FOR MEMORY 12/06/20  Yes McDonough, Lauren K, PA-C  ferrous sulfate 325 (65 FE) MG tablet  Take 1 tablet (325 mg total) by mouth daily with breakfast. 12/30/20  Yes Val Riles, MD  fluticasone (FLONASE) 50 MCG/ACT nasal spray Place 1 spray into both nostrils daily. 02/03/21  Yes McDonough, Lauren K, PA-C  hydrALAZINE (APRESOLINE) 50 MG tablet Take 1 tablet by mouth twice a day, may take an extra 50 mg if Bp is elevated.  Hold if SBP less than 130 mmHg Patient taking differently: Take 50 mg by mouth See admin instructions. Take 1 tablet (54m) by mouth twice daily -- may take 1 additional tablet (548m by mouth daily if needed for elevated blood pressure 01/13/21  Yes KhLavera GuiseMD  melatonin 3 MG TABS tablet Take 3 mg by mouth at bedtime as needed (sleep).    Yes [provider]  pantoprazole (PROTONIX) 40 MG tablet Take 1 tablet (40 mg total) by mouth 2 (two) times daily. 10/20/20  Yes KhLavera GuiseMD  polyethylene glycol (MIRALAX) 17 g packet Take 17 g by mouth daily. Patient taking differently: Take 17 g by mouth daily as needed. 12/29/20  Yes KuVal RilesMD  promethazine (PHENERGAN) 12.5 MG tablet Take 1 tablet (12.5 mg total) by mouth every 8 (eight) hours as needed for nausea or vomiting. 01/24/21  Yes McDonough, Lauren K, PA-C  rOPINIRole (REQUIP) 0.5 MG tablet Take 1 tablet (0.5 mg total) by mouth every evening. 10/20/20  Yes KhLavera GuiseMD  traMADol (UVeatrice Bourbon50 MG tablet Take by mouth every 6 (six) hours as needed.   Yes [provider]  vitamin B-12 1000 MCG tablet Take 1 tablet (1,000 mcg total) by mouth daily. 12/30/20  Yes KuVal RilesMD  warfarin (COUMADIN) 2 MG tablet Take 1 tablet (2 mg total) by mouth daily. 12/06/20  Yes McDonough, Lauren K, PA-C  lisinopril (ZESTRIL) 20 MG tablet Take 1 tablet (20 mg total) by mouth daily. Patient not taking: Reported on 02/28/2021 01/13/21   KhLavera GuiseMD    Review of Systems  Constitutional: Positive for fatigue. Negative for appetite change.  HENT: Negative for congestion, postnasal drip and sore throat.   Eyes: Negative.   Respiratory: Positive for cough and shortness of breath. Negative for chest tightness and wheezing.   Cardiovascular: Negative for chest pain, palpitations and leg swelling.  Gastrointestinal: Negative for abdominal distention and abdominal pain.  Endocrine: Negative.   Genitourinary: Negative.   Musculoskeletal: Positive for back pain. Negative for neck pain.  Skin: Negative.   Allergic/Immunologic: Negative.   Neurological: Negative for dizziness and light-headedness.  Hematological: Negative for adenopathy. Bruises/bleeds easily.  Psychiatric/Behavioral: Negative for dysphoric mood and sleep disturbance (sleeping on 2 pillows). The patient  is not nervous/anxious.     Vitals:   02/28/21 0959  BP: 109/72  Pulse: 95  Resp: 18  SpO2: 99%  Weight: 114 lb (51.7 kg)  Height: _0  (1.473 m)   Wt Readings from Last 3 Encounters:  02/28/21 114 lb (51.7 kg)  02/24/21 114 lb 9.6 oz (52 kg)  02/18/21 117 lb 12.8 oz (53.4 kg)   Lab Results  Component Value Date   CREATININE 2.63 (H) 02/10/2021   CREATININE 2.92 (H) 02/09/2021   CREATININE 3.10 (H) 02/08/2021    Physical Exam Vitals and nursing note reviewed. Exam conducted with a chaperone present (daughter).  Constitutional:      Appearance: Normal appearance.  HENT:     Head: Normocephalic and atraumatic.  Cardiovascular:     Rate and Rhythm: Normal rate. Rhythm irregular.  Pulmonary:     Effort: Pulmonary effort is normal. No respiratory distress.     Breath sounds: No wheezing or rales.  Abdominal:     General: There is no distension.     Palpations: Abdomen is soft.     Tenderness: There is no abdominal tenderness.  Musculoskeletal:        General: No tenderness.     Cervical back: Normal range of motion and neck supple.     Right lower leg: No edema.     Left lower leg: No edema.  Skin:    General: Skin is warm and dry.  Neurological:     General: No focal deficit present.     Mental Status: She is alert and oriented to person, place, and time.  Psychiatric:        Mood and Affect: Mood normal.        Behavior: Behavior normal.        Thought Content: Thought content normal.    Assessment & Plan:  1: Chronic heart failure with preserved ejection fraction along with strutural changes (LAE)- - NYHA class II - euvolemic today - weighing daily and says that her weight has been stable; reminded to call for an overnight weight gain of > 2 pounds or a weekly weight gain of > 5 pounds - not adding salt and her daughter tries to look at food labels for sodium content - does drink some water but also enjoys drinking pepsi; encouraged her to drink more  water and less pepsi - continue off of digoxin and lisinopril at this time until she sees nephrology - current renal function will not allow for SGLT2 or initiation of entresto - BNP 02/06/21 was 233.7 - PharmD reconciled medications with the patient  2: HTN- - BP looks good today - saw PCP (McDonough) 01/24/21 - BMP 02/10/21 reviewed and showed sodium 136, potassium 4.5, creatinine 2.63 and GFR 18  3: DM with CKD stage IV- - A1c 12/24/20 was 6.9% - sees nephrology 03/14/21  4: Pulmonary fibrosis- - saw pulmonology Humphrey Rolls) 02/24/21 - quit smoking 02/10/21 ; complete cessation encouraged  5: Atrial fibrillation- - list of 3 local cardiology groups given to patient's daughter and she says that she will call and schedule an initial visit with someone - hasn't seen cardiology in ~ 10 years - will start metoprolol succinate 12.74m daily as HR has been running between 95-120's   Medication list reviewed.   Return in 6 weeks or sooner for any questions/problems before then.

## 2021-02-28 ENCOUNTER — Encounter: Payer: Self-pay | Admitting: Family

## 2021-02-28 ENCOUNTER — Other Ambulatory Visit: Payer: Self-pay

## 2021-02-28 ENCOUNTER — Other Ambulatory Visit: Payer: Self-pay | Admitting: Hospice and Palliative Medicine

## 2021-02-28 ENCOUNTER — Ambulatory Visit: Payer: Medicare HMO | Attending: Family | Admitting: Family

## 2021-02-28 VITALS — BP 109/72 | HR 95 | Resp 18 | Ht <= 58 in | Wt 114.0 lb

## 2021-02-28 DIAGNOSIS — E785 Hyperlipidemia, unspecified: Secondary | ICD-10-CM | POA: Diagnosis not present

## 2021-02-28 DIAGNOSIS — D649 Anemia, unspecified: Secondary | ICD-10-CM | POA: Insufficient documentation

## 2021-02-28 DIAGNOSIS — E1122 Type 2 diabetes mellitus with diabetic chronic kidney disease: Secondary | ICD-10-CM

## 2021-02-28 DIAGNOSIS — Z87891 Personal history of nicotine dependence: Secondary | ICD-10-CM | POA: Insufficient documentation

## 2021-02-28 DIAGNOSIS — I5032 Chronic diastolic (congestive) heart failure: Secondary | ICD-10-CM

## 2021-02-28 DIAGNOSIS — I4891 Unspecified atrial fibrillation: Secondary | ICD-10-CM | POA: Diagnosis not present

## 2021-02-28 DIAGNOSIS — J8410 Pulmonary fibrosis, unspecified: Secondary | ICD-10-CM | POA: Diagnosis not present

## 2021-02-28 DIAGNOSIS — R059 Cough, unspecified: Secondary | ICD-10-CM

## 2021-02-28 DIAGNOSIS — Z79899 Other long term (current) drug therapy: Secondary | ICD-10-CM | POA: Diagnosis not present

## 2021-02-28 DIAGNOSIS — I480 Paroxysmal atrial fibrillation: Secondary | ICD-10-CM

## 2021-02-28 DIAGNOSIS — K219 Gastro-esophageal reflux disease without esophagitis: Secondary | ICD-10-CM | POA: Insufficient documentation

## 2021-02-28 DIAGNOSIS — G8929 Other chronic pain: Secondary | ICD-10-CM | POA: Diagnosis not present

## 2021-02-28 DIAGNOSIS — I10 Essential (primary) hypertension: Secondary | ICD-10-CM

## 2021-02-28 DIAGNOSIS — N184 Chronic kidney disease, stage 4 (severe): Secondary | ICD-10-CM

## 2021-02-28 DIAGNOSIS — I130 Hypertensive heart and chronic kidney disease with heart failure and stage 1 through stage 4 chronic kidney disease, or unspecified chronic kidney disease: Secondary | ICD-10-CM | POA: Insufficient documentation

## 2021-02-28 MED ORDER — METOPROLOL SUCCINATE ER 25 MG PO TB24: 12.5000 mg | ORAL_TABLET | Freq: Every day | ORAL | 5 refills | Status: DC

## 2021-02-28 NOTE — Progress Notes (Signed)
[facility] - PHARMACIST COUNSELING NOTE  ADHERENCE ASSESSMENT  Adherence strategy: daughter helps manage medications   Do you ever forget to take your medication? '[]' Yes (1) '[x]' No (0)  Do you ever skip doses due to side effects? '[]' Yes (1) '[x]' No (0)  Do you have trouble affording your medicines? '[]' Yes (1) '[x]' No (0)  Are you ever unable to pick up your medication due to transportation difficulties? '[]' Yes (1) '[x]' No (0)  Do you ever stop taking your medications because you don't believe they are helping? '[]' Yes (1) '[x]' No (0)  Total score _0______    Recommendations given to patient about increasing adherence: Pt is adherent on current regimen  Guideline-Directed Medical Therapy/Evidence Based Medicine  ACE/ARB/ARNI: lisinopril Beta Blocker: metoprolol succinate Aldosterone Antagonist: N/A Diuretic: N/A    SUBJECTIVE  HPI:  Past Medical History:  Diagnosis Date  . Anemia   . Atrial fibrillation ([place])   . Chronic kidney disease   . Congestive heart failure (CHF) ([place])   . Diabetes mellitus without complication ([place])   . GERD (gastroesophageal reflux disease)   . Hyperlipidemia   . Hypertension   . Pneumonia   . Pulmonary fibrosis ([facility])         OBJECTIVE   Vital signs: HR 95, BP 109/72, weight (pounds) 114 ECHO: Date 12/24/20, EF 60-65%, notes no LVH, LA mildly dilated  BMP Latest Ref Rng & Units 02/10/2021 02/09/2021 02/08/2021  Glucose 70 - 99 mg/dL 149(H) 161(H) 176(H)  BUN 8 - 23 mg/dL 55(H) 54(H) 50(H)  Creatinine 0.44 - 1.00 mg/dL 2.63(H) 2.92(H) 3.10(H)  BUN/Creat Ratio 12 - 28 - - -  Sodium 135 - 145 mmol/L 136 135 135  Potassium 3.5 - 5.1 mmol/L 4.5 4.1 4.4  Chloride 98 - 111 mmol/L 101 101 104  CO2 22 - 32 mmol/L '26 26 23  ' Calcium 8.9 - 10.3 mg/dL 8.1(L) 7.8(L) 8.0(L)    ASSESSMENT 80 yo F presenting to heart failure clinic for new patient visit. Pt was admitted to the hospital 02/06/21 for acute on chronic HF.  PMH includes diabetes, HLD, HTN, CKD, GERD, afib, anemia, pulmonary fibrosis, and CHF. No barriers to adherence identified. Pts daughter helps manage medications and monitoring of blood pressure and weight.    PLAN CHF/HTN - Continue hydralazine 50 mg twice daily with an additional 50 mg if BP is elevated - Begin taking metoprolol succinate 12.5 mg daily  - Unable to take lisinopril due to elevated SCr  Afib - Continue warfarin 2 mg daily   CKD - Pt has an appointment with nephrology towards the end of May  Diabetes - Continue atorvastatin 10 mg daily   Pain - Continue acetaminophen as needed - Continue tramadol 50 mg every 6 hours as needed  Nausea - Continue promethazine 12.5 mg every 8 hours as needed  Constipation - Continue Miralax as needed - Continue bisacodyl as needed  Restless Leg Syndrome - Continue ropinirole 0.5 mg daily   Sleep - Continue melatonin 3 mg at bedtime as needed  Anemia - Continue ferrous sulfate 325 mg daily   Allergies - Continue fluticasone nasal spray daily  Memory - Continue donepezil 5 mg daily   Mood - Continue citalopram 40 mg daily   Cough  - Continue benzonatate 200 mg twice daily as needed  GERD - Continue pantoprazole 40 mg twice daily   General Health - Continue vitamin B12 supplement daily   Time spent: 15 minutes  Benn Moulder, PharmD Pharmacy  Resident  02/28/2021 11:28 AM    Current Outpatient Medications:  .  acetaminophen (TYLENOL) 325 MG tablet, Take 650 mg by mouth every 6 (six) hours as needed., Disp: , Rfl:  .  atorvastatin (LIPITOR) 10 MG tablet, Take 1 tablet (10 mg total) by mouth every evening., Disp: 90 tablet, Rfl: 1 .  benzonatate (TESSALON) 200 MG capsule, Take 1 capsule (200 mg total) by mouth 2 (two) times daily as needed for cough., Disp: 60 capsule, Rfl: 1 .  bisacodyl (DULCOLAX) 5 MG EC tablet, Take 1 tablet (5 mg total) by mouth daily as needed for moderate constipation., Disp: 30  tablet, Rfl: 0 .  Blood Pressure Monitor KIT, Use three times daily to check blood pressure  DX:  I50.42, Disp: 1 kit, Rfl: 1 .  citalopram (CELEXA) 40 MG tablet, Take 1 tablet (40 mg total) by mouth daily., Disp: 90 tablet, Rfl: 1 .  donepezil (ARICEPT) 5 MG tablet, TAKE 1 TABLET BY MOUTH ONCE DAILY FOR MEMORY (Patient taking differently: Take 5 mg by mouth daily. TAKE 1 TABLET BY MOUTH ONCE DAILY FOR MEMORY), Disp: 90 tablet, Rfl: 2 .  ferrous sulfate 325 (65 FE) MG tablet, Take 1 tablet (325 mg total) by mouth daily with breakfast., Disp: , Rfl: 3 .  fluticasone (FLONASE) 50 MCG/ACT nasal spray, Place 1 spray into both nostrils daily., Disp: 16 mL, Rfl: 0 .  hydrALAZINE (APRESOLINE) 50 MG tablet, Take 1 tablet by mouth twice a day, may take an extra 50 mg if Bp is elevated.  Hold if SBP less than 130 mmHg (Patient taking differently: Take 50 mg by mouth See admin instructions. Take 1 tablet (4m) by mouth twice daily -- may take 1 additional tablet (54m by mouth daily if needed for elevated blood pressure), Disp: 60 tablet, Rfl: 3 .  lisinopril (ZESTRIL) 20 MG tablet, Take 1 tablet (20 mg total) by mouth daily. (Patient not taking: Reported on 02/28/2021), Disp: 30 tablet, Rfl: 3 .  melatonin 3 MG TABS tablet, Take 3 mg by mouth at bedtime as needed (sleep)., Disp: , Rfl:  .  metoprolol succinate (TOPROL XL) 25 MG 24 hr tablet, Take 0.5 tablets (12.5 mg total) by mouth daily., Disp: 15 tablet, Rfl: 5 .  pantoprazole (PROTONIX) 40 MG tablet, Take 1 tablet (40 mg total) by mouth 2 (two) times daily., Disp: 180 tablet, Rfl: 1 .  polyethylene glycol (MIRALAX) 17 g packet, Take 17 g by mouth daily. (Patient taking differently: Take 17 g by mouth daily as needed.), Disp: 14 each, Rfl: 0 .  promethazine (PHENERGAN) 12.5 MG tablet, Take 1 tablet (12.5 mg total) by mouth every 8 (eight) hours as needed for nausea or vomiting., Disp: 20 tablet, Rfl: 0 .  rOPINIRole (REQUIP) 0.5 MG tablet, Take 1 tablet (0.5  mg total) by mouth every evening., Disp: 90 tablet, Rfl: 1 .  traMADol (ULTRAM) 50 MG tablet, Take by mouth every 6 (six) hours as needed., Disp: , Rfl:  .  vitamin B-12 1000 MCG tablet, Take 1 tablet (1,000 mcg total) by mouth daily., Disp: , Rfl:  .  warfarin (COUMADIN) 2 MG tablet, Take 1 tablet (2 mg total) by mouth daily., Disp: 30 tablet, Rfl: 3   COUNSELING POINTS/CLINICAL PEARLS Metoprolol Succinate (Goal: 200 mg once daily) Warn patient to avoid activities requiring mental alertness or coordination until drug effects are realized, as drug may cause dizziness. Tell patient planning major surgery with anesthesia to alert physician that drug is being used,  as drug impairs ability of heart to respond to reflex adrenergic stimuli. Drug may cause diarrhea, fatigue, headache, or depression. Advise diabetic patient to carefully monitor blood glucose as drug may mask symptoms of hypoglycemia. Patient should take extended-release tablet with or immediately following meals. Counsel patient against sudden discontinuation of drug, as this may precipitate hypertension, angina, or myocardial infarction. In the event of a missed dose, counsel patient to skip the missed dose and maintain a regular dosing schedule. Lisinopril (Goal: 20 to 40 mg once daily)  This drug may cause nausea, vomiting, dizziness, headache, or angioedema of face, lips, throat, or intestines.  Instruct patient to report signs/symptoms of hypotension, or a persistent cough.  Advise patient against sudden discontinuation of drug.  DRUGS TO AVOID IN HEART FAILURE  Drug or Class Mechanism  Analgesics . NSAIDs . COX-2 inhibitors . Glucocorticoids  Sodium and water retention, increased systemic vascular resistance, decreased response to diuretics   Diabetes Medications . Metformin . Thiazolidinediones o Rosiglitazone (Avandia) o Pioglitazone (Actos) . DPP4 Inhibitors o Saxagliptin (Onglyza) o Sitagliptin (Januvia)    Lactic acidosis Possible calcium channel blockade   Unknown  Antiarrhythmics . Class I  o Flecainide o Disopyramide . Class III o Sotalol . Other o Dronedarone  Negative inotrope, proarrhythmic   Proarrhythmic, beta blockade  Negative inotrope  Antihypertensives . Alpha Blockers o Doxazosin . Calcium Channel Blockers o Diltiazem o Verapamil o Nifedipine . Central Alpha Adrenergics o Moxonidine . Peripheral Vasodilators o Minoxidil  Increases renin and aldosterone  Negative inotrope    Possible sympathetic withdrawal  Unknown  Anti-infective . Itraconazole . Amphotericin B  Negative inotrope Unknown  Hematologic . Anagrelide . Cilostazol   Possible inhibition of PD IV Inhibition of PD III causing arrhythmias  Neurologic/Psychiatric . Stimulants . Anti-Seizure Drugs o Carbamazepine o Pregabalin . Antidepressants o Tricyclics o Citalopram . Parkinsons o Bromocriptine o Pergolide o Pramipexole . Antipsychotics o Clozapine . Antimigraine o Ergotamine o Methysergide . Appetite suppressants . Bipolar o Lithium  Peripheral alpha and beta agonist activity  Negative inotrope and chronotrope Calcium channel blockade  Negative inotrope, proarrhythmic Dose-dependent QT prolongation  Excessive serotonin activity/valvular damage Excessive serotonin activity/valvular damage Unknown  IgE mediated hypersensitivy, calcium channel blockade  Excessive serotonin activity/valvular damage Excessive serotonin activity/valvular damage Valvular damage  Direct myofibrillar degeneration, adrenergic stimulation  Antimalarials . Chloroquine . Hydroxychloroquine Intracellular inhibition of lysosomal enzymes  Urologic Agents . Alpha Blockers o Doxazosin o Prazosin o Tamsulosin o Terazosin  Increased renin and aldosterone  Adapted from Page RL, et al. "Drugs That May Cause or Exacerbate Heart Failure: A Scientific Statement from the [place]." Circulation 2016; 161:W96-E45. DOI: 10.1161/CIR.0000000000000426   MEDICATION ADHERENCES TIPS AND STRATEGIES 1. Taking medication as prescribed improves patient outcomes in heart failure (reduces hospitalizations, improves symptoms, increases survival) 2. Side effects of medications can be managed by decreasing doses, switching agents, stopping drugs, or adding additional therapy. Please let someone in the [facility] know if you have having bothersome side effects so we can modify your regimen. Do not alter your medication regimen without talking to [place].  3. Medication reminders can help patients remember to take drugs on time. If you are missing or forgetting doses you can try linking behaviors, using pill boxes, or an electronic reminder like an alarm on your phone or an app. Some people can also get automated phone calls as medication reminders.

## 2021-02-28 NOTE — Patient Instructions (Addendum)
Continue weighing daily and call for an overnight weight gain of > 2 pounds or a weekly weight gain of >5 pounds.   [facility] [phone]  Neoma Laming, MD Cardiology [phone]

## 2021-03-01 ENCOUNTER — Telehealth: Payer: Self-pay

## 2021-03-01 NOTE — Telephone Encounter (Signed)
Advised patients daughter of CT scheduled at the  Med center in [place] on 03-10-21. [name]

## 2021-03-03 LAB — PROTIME-INR

## 2021-03-04 ENCOUNTER — Ambulatory Visit (INDEPENDENT_AMBULATORY_CARE_PROVIDER_SITE_OTHER): Payer: Medicare HMO

## 2021-03-04 DIAGNOSIS — Z7901 Long term (current) use of anticoagulants: Secondary | ICD-10-CM | POA: Diagnosis not present

## 2021-03-04 NOTE — Progress Notes (Signed)
Pt INR 1.3 as per dr Humphrey Rolls advised daughter no change for medication for now continue same med as precribe

## 2021-03-09 ENCOUNTER — Other Ambulatory Visit: Payer: Self-pay

## 2021-03-09 ENCOUNTER — Ambulatory Visit: Payer: Medicare HMO | Admitting: Internal Medicine

## 2021-03-09 DIAGNOSIS — J8410 Pulmonary fibrosis, unspecified: Secondary | ICD-10-CM | POA: Diagnosis not present

## 2021-03-09 LAB — PULMONARY FUNCTION TEST

## 2021-03-10 ENCOUNTER — Ambulatory Visit
Admission: RE | Admit: 2021-03-10 | Discharge: 2021-03-10 | Disposition: A | Discharge status: Home / Self Care | Payer: Medicare HMO | Source: Ambulatory Visit | Attending: Internal Medicine | Admitting: Internal Medicine

## 2021-03-10 DIAGNOSIS — J811 Chronic pulmonary edema: Secondary | ICD-10-CM | POA: Diagnosis not present

## 2021-03-10 DIAGNOSIS — J8410 Pulmonary fibrosis, unspecified: Secondary | ICD-10-CM | POA: Diagnosis not present

## 2021-03-10 DIAGNOSIS — J84112 Idiopathic pulmonary fibrosis: Secondary | ICD-10-CM | POA: Diagnosis not present

## 2021-03-10 DIAGNOSIS — I358 Other nonrheumatic aortic valve disorders: Secondary | ICD-10-CM | POA: Diagnosis not present

## 2021-03-10 DIAGNOSIS — J90 Pleural effusion, not elsewhere classified: Secondary | ICD-10-CM | POA: Diagnosis not present

## 2021-03-11 ENCOUNTER — Telehealth: Payer: Self-pay

## 2021-03-11 ENCOUNTER — Ambulatory Visit (INDEPENDENT_AMBULATORY_CARE_PROVIDER_SITE_OTHER): Payer: Medicare HMO

## 2021-03-11 DIAGNOSIS — Z7901 Long term (current) use of anticoagulants: Secondary | ICD-10-CM

## 2021-03-11 LAB — POCT INR: INR: 1.2 — AB (ref 2.0–3.0)

## 2021-03-11 NOTE — Progress Notes (Signed)
INR today was 1.2 and Per Lauren pt needs to take an extra coumadin 2 mg (tolal of 4 mg) today and resume back to 2 mg daily and repeat in 1 week.  informed pt's daughter June about the change in dose for today

## 2021-03-11 NOTE — Telephone Encounter (Signed)
1.2 INR, [name] MDINR [phone] already informed pt's daughter

## 2021-03-12 DIAGNOSIS — I509 Heart failure, unspecified: Secondary | ICD-10-CM | POA: Diagnosis not present

## 2021-03-12 DIAGNOSIS — I5033 Acute on chronic diastolic (congestive) heart failure: Secondary | ICD-10-CM | POA: Diagnosis not present

## 2021-03-14 DIAGNOSIS — E1122 Type 2 diabetes mellitus with diabetic chronic kidney disease: Secondary | ICD-10-CM | POA: Diagnosis not present

## 2021-03-14 DIAGNOSIS — I509 Heart failure, unspecified: Secondary | ICD-10-CM | POA: Diagnosis not present

## 2021-03-14 DIAGNOSIS — Z01810 Encounter for preprocedural cardiovascular examination: Secondary | ICD-10-CM | POA: Diagnosis not present

## 2021-03-14 DIAGNOSIS — R319 Hematuria, unspecified: Secondary | ICD-10-CM | POA: Diagnosis not present

## 2021-03-14 DIAGNOSIS — N184 Chronic kidney disease, stage 4 (severe): Secondary | ICD-10-CM | POA: Diagnosis not present

## 2021-03-14 DIAGNOSIS — E875 Hyperkalemia: Secondary | ICD-10-CM | POA: Diagnosis not present

## 2021-03-14 DIAGNOSIS — I129 Hypertensive chronic kidney disease with stage 1 through stage 4 chronic kidney disease, or unspecified chronic kidney disease: Secondary | ICD-10-CM | POA: Diagnosis not present

## 2021-03-14 DIAGNOSIS — N179 Acute kidney failure, unspecified: Secondary | ICD-10-CM | POA: Diagnosis not present

## 2021-03-14 DIAGNOSIS — D631 Anemia in chronic kidney disease: Secondary | ICD-10-CM | POA: Diagnosis not present

## 2021-03-15 ENCOUNTER — Ambulatory Visit
Admission: RE | Admit: 2021-03-15 | Discharge: 2021-03-15 | Disposition: A | Discharge status: Home / Self Care | Payer: Medicare HMO | Source: Ambulatory Visit | Attending: Internal Medicine | Admitting: Internal Medicine

## 2021-03-15 ENCOUNTER — Telehealth: Payer: Self-pay

## 2021-03-15 ENCOUNTER — Other Ambulatory Visit: Payer: Self-pay

## 2021-03-15 ENCOUNTER — Ambulatory Visit
Admission: RE | Admit: 2021-03-15 | Discharge: 2021-03-15 | Disposition: A | Discharge status: Home / Self Care | Payer: Medicare HMO | Source: Home / Self Care | Attending: Internal Medicine | Admitting: Internal Medicine

## 2021-03-15 ENCOUNTER — Ambulatory Visit (INDEPENDENT_AMBULATORY_CARE_PROVIDER_SITE_OTHER): Payer: Medicare HMO | Admitting: Internal Medicine

## 2021-03-15 ENCOUNTER — Observation Stay
Admission: EM | Admit: 2021-03-15 | Discharge: 2021-03-16 | Disposition: A | Discharge status: Home / Self Care | Payer: Medicare HMO | Attending: Internal Medicine | Admitting: Internal Medicine

## 2021-03-15 ENCOUNTER — Encounter: Payer: Self-pay | Admitting: Internal Medicine

## 2021-03-15 ENCOUNTER — Emergency Department: Payer: Medicare HMO

## 2021-03-15 VITALS — BP 142/78 | HR 68 | Temp 98.3°F | Resp 16 | Ht <= 58 in | Wt 127.0 lb

## 2021-03-15 DIAGNOSIS — E1122 Type 2 diabetes mellitus with diabetic chronic kidney disease: Secondary | ICD-10-CM | POA: Insufficient documentation

## 2021-03-15 DIAGNOSIS — Z20822 Contact with and (suspected) exposure to covid-19: Secondary | ICD-10-CM | POA: Insufficient documentation

## 2021-03-15 DIAGNOSIS — J90 Pleural effusion, not elsewhere classified: Secondary | ICD-10-CM | POA: Diagnosis not present

## 2021-03-15 DIAGNOSIS — I5022 Chronic systolic (congestive) heart failure: Secondary | ICD-10-CM | POA: Diagnosis not present

## 2021-03-15 DIAGNOSIS — E119 Type 2 diabetes mellitus without complications: Secondary | ICD-10-CM

## 2021-03-15 DIAGNOSIS — J9611 Chronic respiratory failure with hypoxia: Secondary | ICD-10-CM | POA: Diagnosis present

## 2021-03-15 DIAGNOSIS — R0602 Shortness of breath: Secondary | ICD-10-CM | POA: Insufficient documentation

## 2021-03-15 DIAGNOSIS — I480 Paroxysmal atrial fibrillation: Secondary | ICD-10-CM | POA: Diagnosis present

## 2021-03-15 DIAGNOSIS — I5021 Acute systolic (congestive) heart failure: Secondary | ICD-10-CM

## 2021-03-15 DIAGNOSIS — Z96651 Presence of right artificial knee joint: Secondary | ICD-10-CM | POA: Insufficient documentation

## 2021-03-15 DIAGNOSIS — N185 Chronic kidney disease, stage 5: Secondary | ICD-10-CM | POA: Diagnosis not present

## 2021-03-15 DIAGNOSIS — I132 Hypertensive heart and chronic kidney disease with heart failure and with stage 5 chronic kidney disease, or end stage renal disease: Secondary | ICD-10-CM | POA: Diagnosis not present

## 2021-03-15 DIAGNOSIS — R0902 Hypoxemia: Secondary | ICD-10-CM

## 2021-03-15 DIAGNOSIS — I5033 Acute on chronic diastolic (congestive) heart failure: Principal | ICD-10-CM | POA: Insufficient documentation

## 2021-03-15 DIAGNOSIS — N183 Chronic kidney disease, stage 3 unspecified: Secondary | ICD-10-CM | POA: Diagnosis present

## 2021-03-15 DIAGNOSIS — D6489 Other specified anemias: Secondary | ICD-10-CM

## 2021-03-15 DIAGNOSIS — J9811 Atelectasis: Secondary | ICD-10-CM | POA: Diagnosis not present

## 2021-03-15 DIAGNOSIS — I110 Hypertensive heart disease with heart failure: Secondary | ICD-10-CM | POA: Diagnosis not present

## 2021-03-15 DIAGNOSIS — Z87891 Personal history of nicotine dependence: Secondary | ICD-10-CM | POA: Diagnosis not present

## 2021-03-15 DIAGNOSIS — Z79899 Other long term (current) drug therapy: Secondary | ICD-10-CM | POA: Diagnosis not present

## 2021-03-15 DIAGNOSIS — Z7901 Long term (current) use of anticoagulants: Secondary | ICD-10-CM | POA: Diagnosis not present

## 2021-03-15 DIAGNOSIS — I10 Essential (primary) hypertension: Secondary | ICD-10-CM | POA: Diagnosis present

## 2021-03-15 DIAGNOSIS — I350 Nonrheumatic aortic (valve) stenosis: Secondary | ICD-10-CM | POA: Diagnosis not present

## 2021-03-15 DIAGNOSIS — I509 Heart failure, unspecified: Secondary | ICD-10-CM | POA: Diagnosis not present

## 2021-03-15 DIAGNOSIS — R062 Wheezing: Secondary | ICD-10-CM | POA: Diagnosis not present

## 2021-03-15 LAB — CBC
HCT: 28.4 % — ABNORMAL LOW (ref 36.0–46.0)
Hemoglobin: 9 g/dL — ABNORMAL LOW (ref 12.0–15.0)
MCH: 29.3 pg (ref 26.0–34.0)
MCHC: 31.7 g/dL (ref 30.0–36.0)
MCV: 92.5 fL (ref 80.0–100.0)
Platelets: 199 10*3/uL (ref 150–400)
RBC: 3.07 MIL/uL — ABNORMAL LOW (ref 3.87–5.11)
RDW: 17.3 % — ABNORMAL HIGH (ref 11.5–15.5)
WBC: 7.8 10*3/uL (ref 4.0–10.5)
nRBC: 0 % (ref 0.0–0.2)

## 2021-03-15 LAB — BASIC METABOLIC PANEL
Anion gap: 9 (ref 5–15)
BUN: 30 mg/dL — ABNORMAL HIGH (ref 8–23)
CO2: 26 mmol/L (ref 22–32)
Calcium: 8.7 mg/dL — ABNORMAL LOW (ref 8.9–10.3)
Chloride: 104 mmol/L (ref 98–111)
Creatinine, Ser: 2.31 mg/dL — ABNORMAL HIGH (ref 0.44–1.00)
GFR, Estimated: 21 mL/min — ABNORMAL LOW (ref >60)
Glucose, Bld: 151 mg/dL — ABNORMAL HIGH (ref 70–99)
Potassium: 4.2 mmol/L (ref 3.5–5.1)
Sodium: 139 mmol/L (ref 135–145)

## 2021-03-15 LAB — CBG MONITORING, ED: Glucose-Capillary: 96 mg/dL (ref 70–99)

## 2021-03-15 LAB — BRAIN NATRIURETIC PEPTIDE: B Natriuretic Peptide: 780.9 pg/mL — ABNORMAL HIGH (ref 0.0–100.0)

## 2021-03-15 LAB — HEPATIC FUNCTION PANEL
ALT: 11 U/L (ref 0–44)
AST: 19 U/L (ref 15–41)
Albumin: 2.7 g/dL — ABNORMAL LOW (ref 3.5–5.0)
Alkaline Phosphatase: 66 U/L (ref 38–126)
Bilirubin, Direct: 0.1 mg/dL (ref 0.0–0.2)
Indirect Bilirubin: 0.5 mg/dL (ref 0.3–0.9)
Total Bilirubin: 0.6 mg/dL (ref 0.3–1.2)
Total Protein: 6.1 g/dL — ABNORMAL LOW (ref 6.5–8.1)

## 2021-03-15 LAB — RESP PANEL BY RT-PCR (FLU A&B, COVID) ARPGX2
Influenza A by PCR: NEGATIVE
Influenza B by PCR: NEGATIVE
SARS Coronavirus 2 by RT PCR: NEGATIVE

## 2021-03-15 LAB — TROPONIN I (HIGH SENSITIVITY)
Troponin I (High Sensitivity): 37 ng/L — ABNORMAL HIGH (ref <18)
Troponin I (High Sensitivity): 35 ng/L — ABNORMAL HIGH (ref <18)

## 2021-03-15 LAB — PROTIME-INR
INR: 1.7 — ABNORMAL HIGH (ref 0.8–1.2)
Prothrombin Time: 19.9 s — ABNORMAL HIGH (ref 11.4–15.2)

## 2021-03-15 LAB — GLUCOSE, CAPILLARY: Glucose-Capillary: 227 mg/dL — ABNORMAL HIGH (ref 70–99)

## 2021-03-15 MED ORDER — BISACODYL 5 MG PO TBEC: 5.0000 mg | DELAYED_RELEASE_TABLET | Freq: Every day | ORAL | Status: DC | PRN

## 2021-03-15 MED ORDER — ALBUTEROL SULFATE (2.5 MG/3ML) 0.083% IN NEBU: 2.5000 mg | INHALATION_SOLUTION | Freq: Four times a day (QID) | RESPIRATORY_TRACT | Status: DC | PRN

## 2021-03-15 MED ORDER — MELATONIN 5 MG PO TABS: 2.5000 mg | ORAL_TABLET | Freq: Every evening | ORAL | Status: DC | PRN

## 2021-03-15 MED ORDER — POLYETHYLENE GLYCOL 3350 17 G PO PACK: 17.0000 g | PACK | Freq: Every day | ORAL | Status: DC | PRN

## 2021-03-15 MED ORDER — PROMETHAZINE HCL 25 MG PO TABS: 12.5000 mg | ORAL_TABLET | Freq: Three times a day (TID) | ORAL | Status: DC | PRN

## 2021-03-15 MED ORDER — FLUTICASONE PROPIONATE 50 MCG/ACT NA SUSP
1.0000 | Freq: Every day | NASAL | Status: DC
  Administered 2021-03-16: 1 via NASAL
  Filled 2021-03-15: qty 16

## 2021-03-15 MED ORDER — HYDRALAZINE HCL 25 MG PO TABS: 25.0000 mg | ORAL_TABLET | Freq: Three times a day (TID) | ORAL | Status: DC | PRN

## 2021-03-15 MED ORDER — ROPINIROLE HCL 1 MG PO TABS: 0.5000 mg | ORAL_TABLET | Freq: Every evening | ORAL | Status: DC

## 2021-03-15 MED ORDER — LISINOPRIL 20 MG PO TABS
20.0000 mg | ORAL_TABLET | Freq: Every day | ORAL | Status: DC
  Administered 2021-03-16: 20 mg via ORAL
  Filled 2021-03-15: qty 1

## 2021-03-15 MED ORDER — ATORVASTATIN CALCIUM 10 MG PO TABS
10.0000 mg | ORAL_TABLET | Freq: Every evening | ORAL | Status: DC
  Administered 2021-03-15: 10 mg via ORAL
  Filled 2021-03-15: qty 1

## 2021-03-15 MED ORDER — PANTOPRAZOLE SODIUM 40 MG PO TBEC
40.0000 mg | DELAYED_RELEASE_TABLET | Freq: Two times a day (BID) | ORAL | Status: DC
  Administered 2021-03-15 – 2021-03-16 (×2): 40 mg via ORAL
  Filled 2021-03-15 (×2): qty 1

## 2021-03-15 MED ORDER — GUAIFENESIN ER 600 MG PO TB12
600.0000 mg | ORAL_TABLET | Freq: Two times a day (BID) | ORAL | Status: DC
  Administered 2021-03-15 – 2021-03-16 (×2): 600 mg via ORAL
  Filled 2021-03-15 (×2): qty 1

## 2021-03-15 MED ORDER — CITALOPRAM HYDROBROMIDE 20 MG PO TABS
40.0000 mg | ORAL_TABLET | Freq: Every day | ORAL | Status: DC
  Administered 2021-03-16: 40 mg via ORAL
  Filled 2021-03-15: qty 2

## 2021-03-15 MED ORDER — INSULIN ASPART 100 UNIT/ML IJ SOLN: 0.0000 [IU] | Freq: Three times a day (TID) | INTRAMUSCULAR | Status: DC

## 2021-03-15 MED ORDER — FERROUS SULFATE 325 (65 FE) MG PO TABS
325.0000 mg | ORAL_TABLET | Freq: Every day | ORAL | Status: DC
  Administered 2021-03-16: 325 mg via ORAL
  Filled 2021-03-15: qty 1

## 2021-03-15 MED ORDER — DONEPEZIL HCL 5 MG PO TABS
5.0000 mg | ORAL_TABLET | Freq: Every day | ORAL | Status: DC
  Administered 2021-03-16: 5 mg via ORAL
  Filled 2021-03-15: qty 1

## 2021-03-15 MED ORDER — VITAMIN B-12 1000 MCG PO TABS
1000.0000 ug | ORAL_TABLET | Freq: Every day | ORAL | Status: DC
  Administered 2021-03-16: 1000 ug via ORAL
  Filled 2021-03-15: qty 1

## 2021-03-15 MED ORDER — FUROSEMIDE 10 MG/ML IJ SOLN
40.0000 mg | Freq: Once | INTRAMUSCULAR | Status: AC
  Administered 2021-03-15: 40 mg via INTRAVENOUS
  Filled 2021-03-15: qty 4

## 2021-03-15 MED ORDER — INSULIN ASPART 100 UNIT/ML IJ SOLN
0.0000 [IU] | Freq: Three times a day (TID) | INTRAMUSCULAR | Status: DC
  Administered 2021-03-15: 2 [IU] via SUBCUTANEOUS
  Filled 2021-03-15: qty 1

## 2021-03-15 MED ORDER — FUROSEMIDE 10 MG/ML IJ SOLN
40.0000 mg | Freq: Every day | INTRAMUSCULAR | Status: DC
  Administered 2021-03-16: 40 mg via INTRAVENOUS
  Filled 2021-03-15: qty 4

## 2021-03-15 MED ORDER — WARFARIN SODIUM 4 MG PO TABS
4.0000 mg | ORAL_TABLET | Freq: Once | ORAL | Status: AC
  Administered 2021-03-15: 4 mg via ORAL
  Filled 2021-03-15 (×2): qty 1

## 2021-03-15 MED ORDER — WARFARIN - PHARMACIST DOSING INPATIENT
Freq: Every day | Status: DC
  Filled 2021-03-15: qty 1

## 2021-03-15 NOTE — Consult Note (Signed)
ANTICOAGULATION CONSULT NOTE - Initial Consult  Pharmacy Consult for warfarin Indication: atrial fibrillation  No Known Allergies  Patient Measurements:     Vital Signs: Temp: 98 F (36.7 C) (05/24 1130) BP: 151/58 (05/24 1700) Pulse Rate: 56 (05/24 1700)  Labs: Recent Labs    03/15/21 1130 03/15/21 1533  HGB 9.0*  --   HCT 28.4*  --   PLT 199  --   LABPROT  --  19.9*  INR  --  1.7*  CREATININE 2.31*  --   TROPONINIHS 37* 35*    Estimated Creatinine Clearance: 14.8 mL/min (A) (by C-G formula based on SCr of 2.31 mg/dL (H)).   Medical History: Past Medical History:  Diagnosis Date  . Anemia   . Atrial fibrillation ([place])   . Chronic kidney disease   . Congestive heart failure (CHF) ([place])   . Diabetes mellitus without complication ([place])   . GERD (gastroesophageal reflux disease)   . Hyperlipidemia   . Hypertension   . Pneumonia   . Pulmonary fibrosis ([facility])     Medications:  Warfarin 2 mg daily   DDIs:   Warfarin/Ropinirole (ropinerole may increase INR while on warfarin)  Assessment: 80 y.o. female with PMH significant for T2DM, osteoarthritis, nicotine dependence, CKD stage IV, GERD, diastolic heart failure, EF 60% on 12/2020, A. fib on warfarin, and anemia. Patient presents with SOB and weakness and admitted to hospital for management of CHF exacerbation. Pharmacy has been consulted for warfarin dosing.   Of note patient is consistently subtherapeutic on current regimen and her dose has not been increased, its unclear if a lower INR goal is being targeted outpatient.   Hgb: 9 Plts: 199 Trop: 37>35  Date Time INR Dose 5/24 1533 1.4 4 mg   Goal of Therapy:  INR 2-3 Monitor platelets by anticoagulation protocol: Yes   Plan:  Patients INR is 1.7 (subtherapeutic)  Will order warfarin 4 mg x 1 tonight Monitor daily INR, CBC, s/s of bleed  Darnelle Bos, PharmD 03/15/2021,6:59 PM

## 2021-03-15 NOTE — H&P (Signed)
History and Physical    LARUEN RISSER VQX:[id number] DOB: 1940/11/02 DOA: 03/15/2021  Referring MD/NP/PA:   PCP: Lavera Guise, MD   Patient coming from:  The patient is coming from home.  At baseline, pt is independent for most of ADL.        Chief Complaint: sob, weakness  HPI: MAKHAYLA MCMURRY is a 80 y.o. female with medical history significant of type 2 diabetes, osteoarthritis, nicotine dependence, CKD stage IV, GERD, diastolic heart failure, EF 60% on 12/2020, A. fib on warfarin, anemia, COPD/?  Lung fibrosis on 2 L nasal oxygen at night presented with shortness of breath, weakness  Patient is not a good historian, per her, chart review, seeing nephrologist this morning, for being fatigued, worsening shortness of breath since yesterday, she was noted to have low blood pressure on exam and was thought patient has heart failure, she was sent for urgent chest x-ray which does show some fluid in lung consistent with CHF, she went to his PCP, found oxygen saturation 89%, blood pressure 142/78, she was sent to the ED. reports one-time whizzing, chronic dry cough, patient reports no fever, chills, chest pain,nausea, vomiting, diarrhea, dysuria, leg swelling.  ED patient was found T-max 98.3, heart rate of 56-68, respiratory 16-21, blood pressure 132/52, oxygen saturation 89% on room air, now 96%, labs shows glucose 151, BUN 30, creatinine 2.31, baseline 2.63 on 01/2021 BNP 718.9, baseline 233 on 02/06/2021, troponin 37, hemoglobin 9.0, 8.3 a month ago, INR 1.2, covid, flu negative  CT chest 5/19: 1. Moderate bilateral pleural effusions, slightly diminished in volume compared to prior examination. 2. Mild pulmonary edema. 3. Evaluation of pulmonary fibrosis is substantially limited by the presence of pleural effusions and edema. Within this limitation, there is a pattern of mild to moderate pulmonary fibrosis featuring irregular peripheral interstitial opacity, likely a component of septal  thickening underlying edema, and some evidence of subpleural bronchiolectasis at the lung bases. No evidence of honeycombing. No significant air trapping on expiratory phase imaging. Findings are consistent with a "probable UIP" pattern by ATS pulmonary fibrosis criteria but could be better assessed at the resolution of pleural effusions and edema. Findings are categorized as probable UIP per consensus guidelines: Diagnosis of Idiopathic Pulmonary Fibrosis: An Official ATS/ERS/JRS/ALAT Clinical Practice Guideline. [place], Iss 5, [phone], Jun 23 2017. 4. Numerous enlarged mediastinal and bilateral hilar lymph nodes, unchanged compared to prior, likely reactive. 5. Coronary artery disease. 6. Aortic valve calcifications. Correlate for echocardiographic evidence of aortic valve dysfunction  Chest x-ray today: 1. Suspect mild CHF superimposed upon chronic interstitial lung disease.2.  Aortic Atherosclerosis (ICD10-I70.0).  In ED, patient was giving Lasix 40 IV hospitalitis  was requested to admit the patient    Review of Systems:   General: no fevers, chills, no body weight gain, has poor appetite, has fatigue HEENT: no blurry vision, hearing changes or sore throat Respiratory: no dyspnea, coughing, wheezing CV: no chest pain, no palpitations GI: no nausea, vomiting, abdominal pain, diarrhea, constipation GU: no dysuria, burning on urination, increased urinary frequency, hematuria  Ext: no leg edema Neuro: no unilateral weakness, numbness, or tingling, no vision change or hearing loss Skin: no rash, no skin tear. MSK: No muscle spasm, no deformity, no limitation of range of movement in spin Heme: No easy bruising.  Travel history: No recent long distant travel.  Allergy: No Known Allergies  Past Medical History:  Diagnosis Date  . Anemia   . Atrial  fibrillation ([place])   . Chronic kidney disease   . Congestive heart failure (CHF) ([place])   . Diabetes  mellitus without complication ([place])   . GERD (gastroesophageal reflux disease)   . Hyperlipidemia   . Hypertension   . Pneumonia   . Pulmonary fibrosis ([place])     Past Surgical History:  Procedure Laterality Date  . ABDOMINAL HYSTERECTOMY    . APPENDECTOMY    . CATARACT EXTRACTION    . CHOLECYSTECTOMY    . HERNIA REPAIR    . right knee replacement    . right nephroectomy      Social History:  reports that she quit smoking about 4 weeks ago. Her smoking use included cigarettes. She smoked 1.00 pack per day. She has never used smokeless tobacco. She reports that she does not drink alcohol and does not use drugs.  Family History:  Family History  Problem Relation Age of Onset  . Breast cancer Mother      Prior to Admission medications   Medication Sig Start Date End Date Taking? Authorizing Provider  acetaminophen (TYLENOL) 325 MG tablet Take 650 mg by mouth every 6 (six) hours as needed.    [provider]  atorvastatin (LIPITOR) 10 MG tablet Take 1 tablet (10 mg total) by mouth every evening. 12/06/20   McDonough, Si Gaul, PA-C  benzonatate (TESSALON) 200 MG capsule Take 1 capsule (200 mg total) by mouth 2 (two) times daily as needed for cough. 02/24/21   Allyne Gee, MD  bisacodyl (DULCOLAX) 5 MG EC tablet Take 1 tablet (5 mg total) by mouth daily as needed for moderate constipation. 12/29/20   Val Riles, MD  Blood Pressure Monitor KIT Use three times daily to check blood pressure  DX:  I50.42 01/13/21   Lavera Guise, MD  citalopram (CELEXA) 40 MG tablet Take 1 tablet (40 mg total) by mouth daily. 10/20/20   Lavera Guise, MD  donepezil (ARICEPT) 5 MG tablet TAKE 1 TABLET BY MOUTH ONCE DAILY FOR MEMORY Patient taking differently: Take 5 mg by mouth daily. TAKE 1 TABLET BY MOUTH ONCE DAILY FOR MEMORY 12/06/20   McDonough, Si Gaul, PA-C  ferrous sulfate 325 (65 FE) MG tablet Take 1 tablet (325 mg total) by mouth daily with breakfast. 12/30/20   Val Riles, MD   fluticasone (FLONASE) 50 MCG/ACT nasal spray Place 1 spray into both nostrils daily. 02/03/21   McDonough, Si Gaul, PA-C  hydrALAZINE (APRESOLINE) 50 MG tablet Take 1 tablet by mouth twice a day, may take an extra 50 mg if Bp is elevated.  Hold if SBP less than 130 mmHg Patient taking differently: Take 50 mg by mouth See admin instructions. Take 1 tablet (53m) by mouth twice daily -- may take 1 additional tablet (548m by mouth daily if needed for elevated blood pressure 01/13/21   KhLavera GuiseMD  lisinopril (ZESTRIL) 20 MG tablet Take 1 tablet (20 mg total) by mouth daily. 01/13/21   KhLavera GuiseMD  melatonin 3 MG TABS tablet Take 3 mg by mouth at bedtime as needed (sleep).    [provider]  metoprolol succinate (TOPROL XL) 25 MG 24 hr tablet Take 0.5 tablets (12.5 mg total) by mouth daily. 02/28/21   HaAlisa GraffFNP  pantoprazole (PROTONIX) 40 MG tablet Take 1 tablet (40 mg total) by mouth 2 (two) times daily. 10/20/20   KhLavera GuiseMD  polyethylene glycol (MIRALAX) 17 g packet Take 17 g by mouth  daily. Patient taking differently: Take 17 g by mouth daily as needed. 12/29/20   Val Riles, MD  promethazine (PHENERGAN) 12.5 MG tablet Take 1 tablet (12.5 mg total) by mouth every 8 (eight) hours as needed for nausea or vomiting. 01/24/21   McDonough, Si Gaul, PA-C  rOPINIRole (REQUIP) 0.5 MG tablet Take 1 tablet (0.5 mg total) by mouth every evening. 10/20/20   Lavera Guise, MD  traMADol Veatrice Bourbon) 50 MG tablet Take by mouth every 6 (six) hours as needed.    [provider]  vitamin B-12 1000 MCG tablet Take 1 tablet (1,000 mcg total) by mouth daily. 12/30/20   Val Riles, MD  warfarin (COUMADIN) 2 MG tablet Take 1 tablet (2 mg total) by mouth daily. 12/06/20   Mylinda Latina, PA-C    Physical Exam: Vitals:   03/15/21 1130 03/15/21 1230  BP: (!) 155/63 (!) 152/52  Pulse: (!) 56 (!) 59  Resp: 18 (!) 21  Temp: 98 F (36.7 C)   SpO2: 95% 96%   General: Not  in acute distress HEENT:       Eyes: PERRL, EOMI, no scleral icterus.       ENT: No discharge from the ears and nose, no pharynx injection, no tonsillar enlargement.        Neck: No JVD, no bruit, no mass felt. Heme: No neck lymph node enlargement. Cardiac: S1/S2, RRR, No murmurs, No gallops or rubs. Respiratory: Good air movement bilaterally. No rales, wheezing, rhonchi or rubs. GI: Soft, nondistended, nontender, no rebound pain, no organomegaly, BS present. GU: No hematuria Ext: No pitting leg edema bilaterally. 2+DP/PT pulse bilaterally. Musculoskeletal: No joint deformities, No joint redness or warmth, no limitation of ROM in spin. Skin: No rashes.  Neuro: Alert, oriented X3, cranial nerves II-XII grossly intact, moves all extremities normally. Muscle strength 5/5 in all extremities, sensation to light touch intact. Brachial reflex 2+ bilaterally. Knee reflex 1+ bilaterally. Negative Babinski's sign. Normal finger to nose test. Psych: Patient is not psychotic, no suicidal or hemocidal ideation.  Labs on Admission: I have personally reviewed following labs and imaging studies  CBC: Recent Labs  Lab 03/15/21 1130  WBC 7.8  HGB 9.0*  HCT 28.4*  MCV 92.5  PLT 161   Basic Metabolic Panel: Recent Labs  Lab 03/15/21 1130  NA 139  K 4.2  CL 104  CO2 26  GLUCOSE 151*  BUN 30*  CREATININE 2.31*  CALCIUM 8.7*   GFR: Estimated Creatinine Clearance: 14.8 mL/min (A) (by C-G formula based on SCr of 2.31 mg/dL (H)). Liver Function Tests: Recent Labs  Lab 03/15/21 1130  AST 19  ALT 11  ALKPHOS 66  BILITOT 0.6  PROT 6.1*  ALBUMIN 2.7*   No results for input(s): LIPASE, AMYLASE in the last 168 hours. No results for input(s): AMMONIA in the last 168 hours. Coagulation Profile: Recent Labs  Lab 03/11/21 0951  INR 1.2*   Cardiac Enzymes: No results for input(s): CKTOTAL, CKMB, CKMBINDEX, TROPONINI in the last 168 hours. BNP (last 3 results) No results for input(s):  PROBNP in the last 8760 hours. HbA1C: No results for input(s): HGBA1C in the last 72 hours. CBG: No results for input(s): GLUCAP in the last 168 hours. Lipid Profile: No results for input(s): CHOL, HDL, LDLCALC, TRIG, CHOLHDL, LDLDIRECT in the last 72 hours. Thyroid Function Tests: No results for input(s): TSH, T4TOTAL, FREET4, T3FREE, THYROIDAB in the last 72 hours. Anemia Panel: No results for input(s): VITAMINB12, FOLATE, FERRITIN, TIBC, IRON,  RETICCTPCT in the last 72 hours. Urine analysis:    Component Value Date/Time   COLORURINE YELLOW (A) 12/23/2020 1040   APPEARANCEUR HAZY (A) 12/23/2020 1040   APPEARANCEUR Cloudy (A) 03/24/2020 0000   LABSPEC 1.013 12/23/2020 1040   PHURINE 6.0 12/23/2020 1040   GLUCOSEU 150 (A) 12/23/2020 1040   HGBUR MODERATE (A) 12/23/2020 1040   BILIRUBINUR NEGATIVE 12/23/2020 1040   BILIRUBINUR Negative 03/24/2020 0000   KETONESUR NEGATIVE 12/23/2020 1040   PROTEINUR >=300 (A) 12/23/2020 1040   UROBILINOGEN 0.2 09/02/2019 1441   NITRITE NEGATIVE 12/23/2020 1040   LEUKOCYTESUR NEGATIVE 12/23/2020 1040   Sepsis Labs: '@LABRCNTIP' (procalcitonin:4,lacticidven:4) )No results found for this or any previous visit (from the past 240 hour(s)).   Radiological Exams on Admission: DG Chest 2 View  Result Date: 03/15/2021 CLINICAL DATA:  Shortness of breath EXAM: CHEST - 2 VIEW COMPARISON:  03/15/2021 FINDINGS: Chronic background interstitial changes. Superimposed mild edema remains possible. Small bilateral pleural effusions with adjacent atelectasis. No pneumothorax. Stable cardiomediastinal contours. No acute osseous abnormality. IMPRESSION: Chronic fibrotic changes with possible superimposed mild edema. Appearance is similar. Small pleural effusions with adjacent atelectasis. Electronically Signed   By: Macy Mis M.D.   On: 03/15/2021 12:19   DG Chest 2 View  Result Date: 03/15/2021 CLINICAL DATA:  Short of breath.  Wheezing EXAM: CHEST - 2 VIEW  COMPARISON:  02/06/2021 FINDINGS: Normal heart size. Aortic atherosclerosis. Persistent bilateral pleural effusions. Diffusely increased reticular interstitial markings are again noted bilaterally. This likely reflects a combination of chronic interstitial lung disease and superimposed pulmonary edema. Atelectasis noted in the left lower lobe. IMPRESSION: 1. Suspect mild CHF superimposed upon chronic interstitial lung disease. 2.  Aortic Atherosclerosis (ICD10-I70.0). Electronically Signed   By: Kerby Moors M.D.   On: 03/15/2021 10:23     EKG: Independently reviewed.  Not done in ED, will get one.   Assessment/Plan Active Problems:   Diabetes (HCC)   AF (paroxysmal atrial fibrillation) (HCC)   CKD (chronic kidney disease), stage III (HCC)   Acute on chronic diastolic CHF (congestive heart failure) (HCC)   SOB (shortness of breath)     #Shortness of breath, generalized weakness: Etiology unclear, likely multifactorial, Most likely acute decompensated heart failure, see below  in the setting of COPD/?  Lung fibrosis, NO significant signs of infection, COVID flu negative, respiratory panel pending to rule out RT manager oxygen breathe treatment   # possible acute heart failure : Suggested by chest x-ray, elevated BNP History Of diastolic heart failure and follow cardiology outpatient, however most recently echo on 12/2020 negative Also could be fluid overload in the setting of CKD however clinically not fit ED give Lasix 40, will continue 40 every 12 hours Fluid restriction I and O Cardiology consult pending, repeat echo if warranted  #COPD: Continue home 2 L nasal cannula at bedtime DuoNeb as needed  #Diabetes: Sliding scale insulin  # type 2 diabetes, osteoarthritis, nicotine dependence, CKD stage IV, GERD, diastolic heart failure, EF 60% on 12/2020, A. fib on warfarin, anemia, COPD/?  Lung fibrosis on 2 L nasal oxygen at night presented with shortness of breath,  weakness Established Active problems see above Continue home medications if appropriate follow-up with his PCP, specialist when necessary   Need to do: Home medication reconciliation after home medication list was obtained and verified    DVT ppx: Coumadin Code Status: Full code Family Communication: None at bed side.             Disposition  Plan:  Anticipate discharge back to previous home environment Consults called:   Admission status: Obs / tele    Date of Service 03/15/2021    Lenna Sciara [facility]   If 7PM-7AM, please contact night-coverage [email or web address] Password [email or web address] 03/15/2021, 1:19 PM

## 2021-03-15 NOTE — ED Notes (Signed)
Lab called to add on trop to blood work already sent.

## 2021-03-15 NOTE — ED Provider Notes (Signed)
 [facility] Emergency Department Provider Note  ____________________________________________   Event Date/Time   First MD Initiated Contact with Patient 03/15/21 1202     (approximate)  I have reviewed the triage vital signs and the nursing notes.   HISTORY  Chief Complaint Shortness of Breath and Back Pain    HPI [name] is a 80 y.o. female presents emergency department with shortness of breath.  Patient had recent admission for congestive heart failure.  Patient states she went to see her doctor and her doctor did a chest x-ray.  Dr. Told her she was having congestive heart failure and the need to come to the ED.  Patient denies any fever or chills.  She is not very active.  States she lives with her daughter.   Past Medical History:  Diagnosis Date  . Anemia   . Atrial fibrillation (HCC)   . Chronic kidney disease   . Congestive heart failure (CHF) (HCC)   . Diabetes mellitus without complication (HCC)   . GERD (gastroesophageal reflux disease)   . Hyperlipidemia   . Hypertension   . Pneumonia   . Pulmonary fibrosis (HCC)     Patient Active Problem List   Diagnosis Date Noted  . Acute renal failure superimposed on stage 4 chronic kidney disease (HCC) 02/08/2021  . Anemia associated with stage 4 chronic renal failure (HCC) 02/08/2021  . Anemia due to stage 5 chronic kidney disease (HCC) 02/07/2021  . Acute CHF (congestive heart failure) (HCC) 02/06/2021  . Acute on chronic diastolic CHF (congestive heart failure) (HCC) 02/06/2021  . Hyperkalemia 02/06/2021  . Weakness 12/24/2020  . Hypertensive urgency 12/23/2020  . Nicotine dependence 12/23/2020  . History of anemia due to chronic kidney disease 12/23/2020  . Pain due to onychomycosis of toenails of both feet 02/12/2020  . Uncontrolled type 2 diabetes mellitus with hyperglycemia (HCC) 05/20/2019  . Acute recurrent pansinusitis 05/11/2019  . Acute otitis media 05/11/2019  . Acute  pain of left shoulder 05/11/2019  . Long term (current) use of anticoagulants 04/03/2019  . Chronic left shoulder pain 03/18/2019  . Sepsis (HCC) 01/12/2018  . Colitis 01/12/2018  . CKD (chronic kidney disease), stage III (HCC) 01/12/2018  . Diabetes (HCC) 12/28/2017  . AF (paroxysmal atrial fibrillation) (HCC) 12/28/2017  . Encounter for general adult medical examination with abnormal findings 12/28/2017  . Primary generalized (osteo)arthritis 12/28/2017  . Hypertension 12/27/2017  . GERD (gastroesophageal reflux disease) 12/27/2017  . Hematuria 03/18/2015  . Chronic combined systolic and diastolic heart failure (HCC) 03/01/2012  . Obesity 03/01/2012  . CKD stage 4 due to type 2 diabetes mellitus (HCC) 03/01/2012  . Other benign neoplasm of connective and other soft tissue of lower limb, including hip 03/15/2011  . Neoplasm of connective tissue 02/01/2011  . Pain in joint, pelvic region and thigh 02/01/2011  . Type 2 diabetes mellitus with stage 5 chronic kidney disease (HCC) 10/03/2006  . Encounter for current long-term use of anticoagulants 02/07/2006  . Essential hypertension 09/24/2004  . Atrial fibrillation (HCC) 06/07/2004    Past Surgical History:  Procedure Laterality Date  . ABDOMINAL HYSTERECTOMY    . APPENDECTOMY    . CATARACT EXTRACTION    . CHOLECYSTECTOMY    . HERNIA REPAIR    . right knee replacement    . right nephroectomy      Prior to Admission medications   Medication Sig Start Date End Date Taking? Authorizing Provider  acetaminophen (TYLENOL) 325 MG tablet Take 650   mg by mouth every 6 (six) hours as needed.    [provider]  atorvastatin (LIPITOR) 10 MG tablet Take 1 tablet (10 mg total) by mouth every evening. 12/06/20   McDonough, Lauren K, PA-C  benzonatate (TESSALON) 200 MG capsule Take 1 capsule (200 mg total) by mouth 2 (two) times daily as needed for cough. 02/24/21   Khan, Saadat A, MD  bisacodyl (DULCOLAX) 5 MG EC tablet Take 1 tablet (5  mg total) by mouth daily as needed for moderate constipation. 12/29/20   Kumar, Dileep, MD  Blood Pressure Monitor KIT Use three times daily to check blood pressure  DX:  I50.42 01/13/21   Khan, Fozia M, MD  citalopram (CELEXA) 40 MG tablet Take 1 tablet (40 mg total) by mouth daily. 10/20/20   Khan, Fozia M, MD  donepezil (ARICEPT) 5 MG tablet TAKE 1 TABLET BY MOUTH ONCE DAILY FOR MEMORY Patient taking differently: Take 5 mg by mouth daily. TAKE 1 TABLET BY MOUTH ONCE DAILY FOR MEMORY 12/06/20   McDonough, Lauren K, PA-C  ferrous sulfate 325 (65 FE) MG tablet Take 1 tablet (325 mg total) by mouth daily with breakfast. 12/30/20   Kumar, Dileep, MD  fluticasone (FLONASE) 50 MCG/ACT nasal spray Place 1 spray into both nostrils daily. 02/03/21   McDonough, Lauren K, PA-C  hydrALAZINE (APRESOLINE) 50 MG tablet Take 1 tablet by mouth twice a day, may take an extra 50 mg if Bp is elevated.  Hold if SBP less than 130 mmHg Patient taking differently: Take 50 mg by mouth See admin instructions. Take 1 tablet (50mg) by mouth twice daily -- may take 1 additional tablet (50mg) by mouth daily if needed for elevated blood pressure 01/13/21   Khan, Fozia M, MD  lisinopril (ZESTRIL) 20 MG tablet Take 1 tablet (20 mg total) by mouth daily. 01/13/21   Khan, Fozia M, MD  melatonin 3 MG TABS tablet Take 3 mg by mouth at bedtime as needed (sleep).    [provider]  metoprolol succinate (TOPROL XL) 25 MG 24 hr tablet Take 0.5 tablets (12.5 mg total) by mouth daily. 02/28/21   Hackney, Tina A, FNP  pantoprazole (PROTONIX) 40 MG tablet Take 1 tablet (40 mg total) by mouth 2 (two) times daily. 10/20/20   Khan, Fozia M, MD  polyethylene glycol (MIRALAX) 17 g packet Take 17 g by mouth daily. Patient taking differently: Take 17 g by mouth daily as needed. 12/29/20   Kumar, Dileep, MD  promethazine (PHENERGAN) 12.5 MG tablet Take 1 tablet (12.5 mg total) by mouth every 8 (eight) hours as needed for nausea or vomiting. 01/24/21    McDonough, Lauren K, PA-C  rOPINIRole (REQUIP) 0.5 MG tablet Take 1 tablet (0.5 mg total) by mouth every evening. 10/20/20   Khan, Fozia M, MD  traMADol (ULTRAM) 50 MG tablet Take by mouth every 6 (six) hours as needed.    [provider]  vitamin B-12 1000 MCG tablet Take 1 tablet (1,000 mcg total) by mouth daily. 12/30/20   Kumar, Dileep, MD  warfarin (COUMADIN) 2 MG tablet Take 1 tablet (2 mg total) by mouth daily. 12/06/20   McDonough, Lauren K, PA-C    Allergies Patient has no known allergies.  Family History  Problem Relation Age of Onset  . Breast cancer Mother     Social History Social History   Tobacco Use  . Smoking status: Former Smoker    Packs/day: 1.00    Types: Cigarettes    Quit   date: 02/10/2021    Years since quitting: 0.0  . Smokeless tobacco: Never Used  Vaping Use  . Vaping Use: Never used  Substance Use Topics  . Alcohol use: No  . Drug use: No    Review of Systems  Constitutional: No fever/chills Eyes: No visual changes. ENT: No sore throat. Respiratory: Denies cough, positive shortness of breath Cardiovascular: Denies chest pain Gastrointestinal: Denies abdominal pain Genitourinary: Negative for dysuria. Musculoskeletal: Negative for back pain. Skin: Negative for rash. Psychiatric: no mood changes,     ____________________________________________   PHYSICAL EXAM:  VITAL SIGNS: ED Triage Vitals  Enc Vitals Group     BP 03/15/21 1130 (!) 155/63     Pulse Rate 03/15/21 1130 (!) 56     Resp 03/15/21 1130 18     Temp 03/15/21 1130 98 F (36.7 C)     Temp src --      SpO2 03/15/21 1130 95 %     Weight --      Height --      Head Circumference --      Peak Flow --      Pain Score 03/15/21 1128 5     Pain Loc --      Pain Edu? --      Excl. in [place]? --     Constitutional: Alert and oriented. Well appearing and in no acute distress. Eyes: Conjunctivae are normal.  Head: Atraumatic. Nose: No  congestion/rhinnorhea. Mouth/Throat: Mucous membranes are moist.   Neck:  supple no lymphadenopathy noted Cardiovascular: Normal rate, regular rhythm. Heart sounds are normal Respiratory: Normal respiratory effort.  No retractions, lungs c t a, patient has an expiratory wheeze when talking but not during inspiration Abd: soft nontender bs normal all 4 quad GU: deferred Musculoskeletal: FROM all extremities, warm and well perfused Neurologic:  Normal speech and language.  Skin:  Skin is warm, dry and intact. No rash noted. Psychiatric: Mood and affect are normal. Speech and behavior are normal.  ____________________________________________   LABS (all labs ordered are listed, but only abnormal results are displayed)  Labs Reviewed  CBC - Abnormal; Notable for the following components:      Result Value   RBC 3.07 (*)    Hemoglobin 9.0 (*)    HCT 28.4 (*)    RDW 17.3 (*)    All other components within normal limits  BASIC METABOLIC PANEL - Abnormal; Notable for the following components:   Glucose, Bld 151 (*)    BUN 30 (*)    Creatinine, Ser 2.31 (*)    Calcium 8.7 (*)    GFR, Estimated 21 (*)    All other components within normal limits  HEPATIC FUNCTION PANEL - Abnormal; Notable for the following components:   Total Protein 6.1 (*)    Albumin 2.7 (*)    All other components within normal limits  BRAIN NATRIURETIC PEPTIDE - Abnormal; Notable for the following components:   B Natriuretic Peptide 780.9 (*)    All other components within normal limits  TROPONIN I (HIGH SENSITIVITY) - Abnormal; Notable for the following components:   Troponin I (High Sensitivity) 37 (*)    All other components within normal limits  RESP PANEL BY RT-PCR (FLU A&B, COVID) ARPGX2   ____________________________________________   ____________________________________________  RADIOLOGY  Chest x-ray  ____________________________________________   PROCEDURES  Procedure(s) performed: EKG  shows sinus rhythm with PVCs, see physician read  Procedures    ____________________________________________   INITIAL IMPRESSION / ASSESSMENT AND  PLAN / ED COURSE  Pertinent labs & imaging results that were available during my care of the patient were reviewed by me and considered in my medical decision making (see chart for details).   Patient is a 79-year-old female with history of congestive heart failure presents emergency department with shortness of breath and "lung pain "symptoms have been ongoing for a few days.  Worsened over today.  Physical exam shows patient is stable at this time  On review of the old chart from her regular doctor O2 saturations 89% room air in the office, 95% room air here in the ED  DDx: Respiratory distress, CHF, COVID, CAP, MI  CBC and metabolic panel are normal for the patient's trend  Chest x-ray shows pleural effusions along with fibrotic changes.  This was reviewed by me and confirmed by radiology, radiologist states that pleural effusions may indicate increased edema    BNP has increased from 233-780.9 in 1 month, troponin is also elevated at 37 but she was normal, respiratory panel is negative  Discussed with hospitalist.  To be admitting her heart failure/fluid overload She is in stable condition at this time  Kenni A Araque was evaluated in Emergency Department on 03/15/2021 for the symptoms described in the history of present illness. She was evaluated in the context of the global COVID-19 pandemic, which necessitated consideration that the patient might be at risk for infection with the SARS-CoV-2 virus that causes COVID-19. Institutional protocols and algorithms that pertain to the evaluation of patients at risk for COVID-19 are in a state of rapid change based on information released by regulatory bodies including the CDC and federal and state organizations. These policies and algorithms were followed during the patient's care in the ED.     As part of my medical decision making, I reviewed the following data within the electronic medical record:  Nursing notes reviewed and incorporated, Labs reviewed , EKG interpreted , Old chart reviewed, Radiograph reviewed , Discussed with admitting physician , Notes from prior ED visits and [place] Controlled Substance Database  ____________________________________________   FINAL CLINICAL IMPRESSION(S) / ED DIAGNOSES  Final diagnoses:  Acute systolic (congestive) heart failure (HCC)      NEW MEDICATIONS STARTED DURING THIS VISIT:  New Prescriptions   No medications on file     Note:  This document was prepared using Dragon voice recognition software and may include unintentional dictation errors.    ,  W, PA-C 03/15/21 1335    Quale, Mark, MD 03/15/21 1453  

## 2021-03-15 NOTE — ED Notes (Signed)
Patient placed on purewick at this time. 

## 2021-03-15 NOTE — ED Triage Notes (Signed)
Pt comes with c/o SOB and back pain that started yesterday. Pt states recently dx with CHF.  Pt denies any CP and swelling.

## 2021-03-15 NOTE — Consult Note (Incomplete)
ANTICOAGULATION CONSULT NOTE - Consult  Pharmacy Consult for Warfarin mgmt Indication: atrial fibrillation  No Known Allergies  Patient Measurements:   Heparin Dosing Weight: 53.1kg  Vital Signs: Temp: 98 F (36.7 C) (05/24 1130) BP: 163/76 (05/24 1330) Pulse Rate: 55 (05/24 1330)  Labs: Recent Labs    03/15/21 1130  HGB 9.0*  HCT 28.4*  PLT 199  CREATININE 2.31*  TROPONINIHS 37*    Estimated Creatinine Clearance: 14.8 mL/min (A) (by C-G formula based on SCr of 2.31 mg/dL (H)).   Medications:  PTA: Warfarin 2mg  daily. No other APT or DDI's. Inpatient: no currently ordered interacting meds.  Assessment: 80yo female w/ h/o CHF, Afib (on VKA), DM, HLD, HTN, CKD4, and pulm fibrosis presenting with SOB. Admitted to hospital for mgmt of CHF exac and VO. Pharmacy consulted for the mgmt of Warfarin.  Date Time INR Dose/Comment 5/24 1533 1.7   Labs on admit: INR: 1.7 Scr: 2.31>__ Hgb: 9.0 > __ Plts: 199> __ Trop: 37> __  Goal of Therapy:  INR 2-3 Monitor platelets by anticoagulation protocol: Yes   Plan:     [name] 03/15/2021,2:16 PM

## 2021-03-15 NOTE — ED Notes (Signed)
Lindsay RN aware of assigned bed 

## 2021-03-15 NOTE — Progress Notes (Signed)
Okay awesome thank you [facility] [address] [place], [place] [address]  Internal MEDICINE  Office Visit Note  Patient Name: [name]  [id number]  [id number]  Date of Service: 03/15/2021  Chief Complaint  Patient presents with  . Follow-up    PFT and ct results, med review  . Quality Metric Gaps    Eye exam, foot exam    HPI Patient is here in the office with her daughter with complaints of being fatigued and worsening shortness of breath this morning she was seen in nephrology clinic and she was noticed to have low blood pressure and on examination it was thought maybe she is in heart failure.  She was sent for an urgent chest x-ray which does still show fluid in her lungs consistent with CHF Patient was a started on metoprolol by the heart failure clinic her blood pressure according to the daughter dropped to 60 systolic the medicine was reduced and have however her heart rate continues to drop along with a systolic blood pressure Patient also has history of chronic anemia and required blood transfusion in March.  She looks fragile and pale today.  Her O2 saturations are 89% on room air   Current Medication: Outpatient Encounter Medications as of 03/15/2021  Medication Sig Note  . acetaminophen (TYLENOL) 325 MG tablet Take 650 mg by mouth every 6 (six) hours as needed.   Marland Kitchen atorvastatin (LIPITOR) 10 MG tablet Take 1 tablet (10 mg total) by mouth every evening. 02/06/2021: Last filled 12-06-2020 #90  . benzonatate (TESSALON) 200 MG capsule Take 1 capsule (200 mg total) by mouth 2 (two) times daily as needed for cough.   . bisacodyl (DULCOLAX) 5 MG EC tablet Take 1 tablet (5 mg total) by mouth daily as needed for moderate constipation.   . Blood Pressure Monitor KIT Use three times daily to check blood pressure  DX:  I50.42   . citalopram (CELEXA) 40 MG tablet Take 1 tablet (40 mg total) by mouth daily. 02/06/2021: Last filled 01-17-2021 #90  . donepezil (ARICEPT) 5 MG  tablet TAKE 1 TABLET BY MOUTH ONCE DAILY FOR MEMORY (Patient taking differently: Take 5 mg by mouth daily. TAKE 1 TABLET BY MOUTH ONCE DAILY FOR MEMORY) 02/06/2021: Last filled 12-06-2020 #90  . ferrous sulfate 325 (65 FE) MG tablet Take 1 tablet (325 mg total) by mouth daily with breakfast.   . fluticasone (FLONASE) 50 MCG/ACT nasal spray Place 1 spray into both nostrils daily.   . hydrALAZINE (APRESOLINE) 50 MG tablet Take 1 tablet by mouth twice a day, may take an extra 50 mg if Bp is elevated.  Hold if SBP less than 130 mmHg (Patient taking differently: Take 50 mg by mouth See admin instructions. Take 1 tablet (69m) by mouth twice daily -- may take 1 additional tablet (555m by mouth daily if needed for elevated blood pressure) 02/06/2021: Last filled 01-13-2021 #240  . lisinopril (ZESTRIL) 20 MG tablet Take 1 tablet (20 mg total) by mouth daily. 02/06/2021: Last filled 01-13-2021 #90  . melatonin 3 MG TABS tablet Take 3 mg by mouth at bedtime as needed (sleep).   . metoprolol succinate (TOPROL XL) 25 MG 24 hr tablet Take 0.5 tablets (12.5 mg total) by mouth daily.   . pantoprazole (PROTONIX) 40 MG tablet Take 1 tablet (40 mg total) by mouth 2 (two) times daily. 02/06/2021: Last filled 01-17-2021 #180  . polyethylene glycol (MIRALAX) 17 g packet Take 17 g by mouth daily. (Patient  taking differently: Take 17 g by mouth daily as needed.)   . promethazine (PHENERGAN) 12.5 MG tablet Take 1 tablet (12.5 mg total) by mouth every 8 (eight) hours as needed for nausea or vomiting. 02/06/2021: Last filled 01-24-2021 #20  . rOPINIRole (REQUIP) 0.5 MG tablet Take 1 tablet (0.5 mg total) by mouth every evening. 02/06/2021: Last filled 01-17-2021 #90  . traMADol (ULTRAM) 50 MG tablet Take by mouth every 6 (six) hours as needed.   . vitamin B-12 1000 MCG tablet Take 1 tablet (1,000 mcg total) by mouth daily.   Marland Kitchen warfarin (COUMADIN) 2 MG tablet Take 1 tablet (2 mg total) by mouth daily. 02/06/2021: Last filled 01-28-2021 #30    No facility-administered encounter medications on file as of 03/15/2021.    Surgical History: Past Surgical History:  Procedure Laterality Date  . ABDOMINAL HYSTERECTOMY    . APPENDECTOMY    . CATARACT EXTRACTION    . CHOLECYSTECTOMY    . HERNIA REPAIR    . right knee replacement    . right nephroectomy      Medical History: Past Medical History:  Diagnosis Date  . Anemia   . Atrial fibrillation ([place])   . Chronic kidney disease   . Congestive heart failure (CHF) ([place])   . Diabetes mellitus without complication ([place])   . GERD (gastroesophageal reflux disease)   . Hyperlipidemia   . Hypertension   . Pneumonia   . Pulmonary fibrosis ([place])     Family History: Family History  Problem Relation Age of Onset  . Breast cancer Mother     Social History   Socioeconomic History  . Marital status: Widowed    Spouse name: Not on file  . Number of children: Not on file  . Years of education: Not on file  . Highest education level: Not on file  Occupational History  . Occupation: retired  Tobacco Use  . Smoking status: Former Smoker    Packs/day: 1.00    Types: Cigarettes    Quit date: 02/10/2021    Years since quitting: 0.0  . Smokeless tobacco: Never Used  Vaping Use  . Vaping Use: Never used  Substance and Sexual Activity  . Alcohol use: No  . Drug use: No  . Sexual activity: Not Currently  Other Topics Concern  . Not on file  Social History Narrative   Lives with daughter   Social Determinants of Health   Financial Resource Strain: Not on file  Food Insecurity: Not on file  Transportation Needs: Not on file  Physical Activity: Not on file  Stress: Not on file  Social Connections: Not on file  Intimate Partner Violence: Not on file      Review of Systems  Constitutional: Positive for fatigue. Negative for fever.  HENT: Negative for congestion, mouth sores and postnasal drip.   Respiratory: Positive for shortness of breath. Negative for cough.    Cardiovascular: Negative for chest pain.  Genitourinary: Negative for flank pain.  Skin: Positive for pallor.  Psychiatric/Behavioral: Positive for dysphoric mood.    Vital Signs: BP (!) 142/78   Pulse 68   Temp 98.3 F (36.8 C)   Resp 16   Ht _0  (1.473 m)   Wt 127 lb (57.6 kg)   SpO2 (!) 89%   BMI 26.54 kg/m    Physical Exam Constitutional:      Appearance: Normal appearance. She is ill-appearing.  HENT:     Head: Normocephalic and atraumatic.     Nose:  Nose normal.     Mouth/Throat:     Mouth: Mucous membranes are moist.     Pharynx: No posterior oropharyngeal erythema.  Eyes:     Extraocular Movements: Extraocular movements intact.     Pupils: Pupils are equal, round, and reactive to light.  Cardiovascular:     Pulses: Normal pulses.     Heart sounds: Normal heart sounds.  Pulmonary:     Breath sounds: Rales present.  Neurological:     General: No focal deficit present.     Mental Status: She is alert.  Psychiatric:        Mood and Affect: Mood normal.        Behavior: Behavior normal.     Assessment/Plan: 1. Acute systolic congestive heart failure (HCC) Urgent chest x-ray was ordered which did show mild CHF superimposed to chronic interstitial lung disease her O2 saturations were 89% blood pressure is low she is also bradycardic with a heart rate of 53 patient sent to ED for further evaluation and treatment  2. Hypoxemia Her O2 saturations were 89% she was started on 2 L nasal cannula here in the office and was referred to the emergency department spoke with [facility] triage nurse and they are expecting the patient  3. Anemia due to other cause, not classified Patient has history of anemia was supposed to see GI he was transfused in March, on exam she is somewhat weak and pale   She will need urgent CBC, BMP  done in the emergency department General Counseling: Kinya verbalizes understanding of the findings of todays visit and agrees with plan of  treatment. I have discussed any further diagnostic evaluation that may be needed or ordered today. We also reviewed her medications today. she has been encouraged to call the office with any questions or concerns that should arise related to todays visit.   Total time spent: 30 Minutes Time spent includes review of chart, medications, test results, and follow up plan with the patient.   [place] Controlled Substance Database was reviewed by me.   Dr Lavera Guise Internal medicine

## 2021-03-15 NOTE — Consult Note (Signed)
[facility] Consultation Note  Patient ID: [name], MRN: [id number], DOB/AGE: [id number] y.o. Admit date: 03/15/2021   Date of Consult: 03/15/2021 Primary Physician: Lavera Guise, MD Primary Cardiologist: None  Chief Complaint:  Chief Complaint  Patient presents with  . Shortness of Breath  . Back Pain   Reason for Consult: Heart failure  HPI: 80 y.o. female with known chronic diastolic dysfunction congestive heart failure diabetes hypertension hyperlipidemia chronic kidney disease stage IV with a glomerular filtration rate of 21 having a mild aortic valve stenosis.  The patient has had progression of shortness of breath with physical activity and unable to do the physical activity evaluation with some PND and orthopnea.  The patient has been seen in the emergency room with an EKG showing normal sinus rhythm with nonspecific ST and T wave changes.  Chest x-ray shows pulmonary edema troponin was 37/35 consistent with demand ischemia rather than acute coronary syndrome.  Glomerular filtration rate is 21 with a BNP of 780.  The patient has received intravenous Lasix and has a slight improvements of oxygenation but still has some bibasilar Rales.  Currently the patient feels well better than admission.  Previous echocardiogram has shown normal LV systolic function with ejection fraction of 60% and mild aortic valve stenosis.  Currently the patient is hemodynamically stable  Past Medical History:  Diagnosis Date  . Anemia   . Atrial fibrillation ([place])   . Chronic kidney disease   . Congestive heart failure (CHF) ([place])   . Diabetes mellitus without complication ([place])   . GERD (gastroesophageal reflux disease)   . Hyperlipidemia   . Hypertension   . Pneumonia   . Pulmonary fibrosis [facility])       Surgical History:  Past Surgical History:  Procedure Laterality Date  . ABDOMINAL HYSTERECTOMY    . APPENDECTOMY    . CATARACT EXTRACTION    . CHOLECYSTECTOMY    . HERNIA  REPAIR    . right knee replacement    . right nephroectomy       Home Meds: Prior to Admission medications   Medication Sig Start Date End Date Taking? Authorizing Provider  hydrALAZINE (APRESOLINE) 25 MG tablet Take 25 mg by mouth 3 (three) times daily as needed for high blood pressure. May take an additional tablet if BP is elevated. Hold if SBP less than 130 mmhg 03/14/21 04/13/21 Yes [provider]  acetaminophen (TYLENOL) 325 MG tablet Take 650 mg by mouth every 6 (six) hours as needed.    [provider]  atorvastatin (LIPITOR) 10 MG tablet Take 1 tablet (10 mg total) by mouth every evening. 12/06/20   McDonough, Si Gaul, PA-C  benzonatate (TESSALON) 200 MG capsule Take 1 capsule (200 mg total) by mouth 2 (two) times daily as needed for cough. 02/24/21   Allyne Gee, MD  bisacodyl (DULCOLAX) 5 MG EC tablet Take 1 tablet (5 mg total) by mouth daily as needed for moderate constipation. 12/29/20   Val Riles, MD  Blood Pressure Monitor KIT Use three times daily to check blood pressure  DX:  I50.42 01/13/21   Lavera Guise, MD  citalopram (CELEXA) 40 MG tablet Take 1 tablet (40 mg total) by mouth daily. 10/20/20   Lavera Guise, MD  donepezil (ARICEPT) 5 MG tablet TAKE 1 TABLET BY MOUTH ONCE DAILY FOR MEMORY Patient taking differently: Take 5 mg by mouth daily. TAKE 1 TABLET BY MOUTH ONCE DAILY FOR MEMORY 12/06/20   McDonough,  Lauren K, PA-C  ferrous sulfate 325 (65 FE) MG tablet Take 1 tablet (325 mg total) by mouth daily with breakfast. 12/30/20   Val Riles, MD  fluticasone (FLONASE) 50 MCG/ACT nasal spray Place 1 spray into both nostrils daily. 02/03/21   McDonough, Si Gaul, PA-C  hydrALAZINE (APRESOLINE) 50 MG tablet Take 1 tablet by mouth twice a day, may take an extra 50 mg if Bp is elevated.  Hold if SBP less than 130 mmHg Patient taking differently: Take 50 mg by mouth See admin instructions. Take 1 tablet (48m) by mouth twice daily -- may take 1 additional tablet  (523m by mouth daily if needed for elevated blood pressure 01/13/21   KhLavera GuiseMD  lisinopril (ZESTRIL) 20 MG tablet Take 1 tablet (20 mg total) by mouth daily. 01/13/21   KhLavera GuiseMD  melatonin 3 MG TABS tablet Take 3 mg by mouth at bedtime as needed (sleep).    [provider]  metoprolol succinate (TOPROL XL) 25 MG 24 hr tablet Take 0.5 tablets (12.5 mg total) by mouth daily. 02/28/21   HaAlisa GraffFNP  pantoprazole (PROTONIX) 40 MG tablet Take 1 tablet (40 mg total) by mouth 2 (two) times daily. 10/20/20   KhLavera GuiseMD  polyethylene glycol (MIRALAX) 17 g packet Take 17 g by mouth daily. Patient taking differently: Take 17 g by mouth daily as needed. 12/29/20   KuVal RilesMD  promethazine (PHENERGAN) 12.5 MG tablet Take 1 tablet (12.5 mg total) by mouth every 8 (eight) hours as needed for nausea or vomiting. 01/24/21   McDonough, LaSi GaulPA-C  rOPINIRole (REQUIP) 0.5 MG tablet Take 1 tablet (0.5 mg total) by mouth every evening. 10/20/20   KhLavera GuiseMD  vitamin B-12 1000 MCG tablet Take 1 tablet (1,000 mcg total) by mouth daily. 12/30/20   KuVal RilesMD  warfarin (COUMADIN) 2 MG tablet Take 1 tablet (2 mg total) by mouth daily. 12/06/20   McDonough, LaSi GaulPA-C    Inpatient Medications:  . [START ON 03/16/2021] furosemide  40 mg Intravenous Daily  . guaiFENesin  600 mg Oral BID  . insulin aspart  0-6 Units Subcutaneous TID WC     Allergies: No Known Allergies  Social History   Socioeconomic History  . Marital status: Widowed    Spouse name: Not on file  . Number of children: Not on file  . Years of education: Not on file  . Highest education level: Not on file  Occupational History  . Occupation: retired  Tobacco Use  . Smoking status: Former Smoker    Packs/day: 1.00    Types: Cigarettes    Quit date: 02/10/2021    Years since quitting: 0.0  . Smokeless tobacco: Never Used  Vaping Use  . Vaping Use: Never used  Substance and Sexual  Activity  . Alcohol use: No  . Drug use: No  . Sexual activity: Not Currently  Other Topics Concern  . Not on file  Social History Narrative   Lives with daughter   Social Determinants of Health   Financial Resource Strain: Not on file  Food Insecurity: Not on file  Transportation Needs: Not on file  Physical Activity: Not on file  Stress: Not on file  Social Connections: Not on file  Intimate Partner Violence: Not on file     Family History  Problem Relation Age of Onset  . Breast cancer Mother      Review of  Systems Positive for shortness of breath PND orthopnea Negative for: General:  chills, fever, night sweats or weight changes.  Cardiovascular: Positive for PND orthopnea negative for syncope dizziness  Dermatological skin lesions rashes Respiratory: Cough congestion Urologic: Frequent urination urination at night and hematuria Abdominal: negative for nausea, vomiting, diarrhea, bright red blood per rectum, melena, or hematemesis Neurologic: negative for visual changes, and/or hearing changes  All other systems reviewed and are otherwise negative except as noted above.  Labs: No results for input(s): CKTOTAL, CKMB, TROPONINI in the last 72 hours. Lab Results  Component Value Date   WBC 7.8 03/15/2021   HGB 9.0 (L) 03/15/2021   HCT 28.4 (L) 03/15/2021   MCV 92.5 03/15/2021   PLT 199 03/15/2021    Recent Labs  Lab 03/15/21 1130  NA 139  K 4.2  CL 104  CO2 26  BUN 30*  CREATININE 2.31*  CALCIUM 8.7*  PROT 6.1*  BILITOT 0.6  ALKPHOS 66  ALT 11  AST 19  GLUCOSE 151*   Lab Results  Component Value Date   CHOL 97 12/24/2020   HDL 22 (L) 12/24/2020   LDLCALC 44 12/24/2020   TRIG 153 (H) 12/24/2020   No results found for: DDIMER  Radiology/Studies:  DG Chest 2 View  Result Date: 03/15/2021 CLINICAL DATA:  Shortness of breath EXAM: CHEST - 2 VIEW COMPARISON:  03/15/2021 FINDINGS: Chronic background interstitial changes. Superimposed mild edema  remains possible. Small bilateral pleural effusions with adjacent atelectasis. No pneumothorax. Stable cardiomediastinal contours. No acute osseous abnormality. IMPRESSION: Chronic fibrotic changes with possible superimposed mild edema. Appearance is similar. Small pleural effusions with adjacent atelectasis. Electronically Signed   By: Macy Mis M.D.   On: 03/15/2021 12:19   DG Chest 2 View  Result Date: 03/15/2021 CLINICAL DATA:  Short of breath.  Wheezing EXAM: CHEST - 2 VIEW COMPARISON:  02/06/2021 FINDINGS: Normal heart size. Aortic atherosclerosis. Persistent bilateral pleural effusions. Diffusely increased reticular interstitial markings are again noted bilaterally. This likely reflects a combination of chronic interstitial lung disease and superimposed pulmonary edema. Atelectasis noted in the left lower lobe. IMPRESSION: 1. Suspect mild CHF superimposed upon chronic interstitial lung disease. 2.  Aortic Atherosclerosis (ICD10-I70.0). Electronically Signed   By: Kerby Moors M.D.   On: 03/15/2021 10:23   CT Chest Wo Contrast  Result Date: 03/13/2021 CLINICAL DATA:  IPF EXAM: CT CHEST WITHOUT CONTRAST TECHNIQUE: Multidetector CT imaging of the chest was performed following the standard protocol without IV contrast. COMPARISON:  02/06/2021 FINDINGS: Cardiovascular: Aortic atherosclerosis. Aortic valve calcifications. Normal heart size. Left coronary artery calcifications. No pericardial effusion. Mediastinum/Nodes: Numerous enlarged mediastinal and bilateral hilar lymph nodes, unchanged compared to prior, largest pretracheal nodes measuring 1.3 x 1.2 cm (series 2, image 48). Thyroid gland, trachea, and esophagus demonstrate no significant findings. Lungs/Pleura: Moderate bilateral pleural effusions, slightly diminished in volume compared to prior examination. Evaluation of pulmonary fibrosis is substantially limited by the presence of pleural effusions and edema. Within this limitation, there is  a pattern of mild to moderate pulmonary fibrosis featuring irregular peripheral interstitial opacity, likely a component of septal thickening underlying edema, and some evidence of subpleural bronchiolectasis at the lung bases. No evidence of honeycombing. No significant air trapping on expiratory phase imaging. Diffuse bilateral bronchial wall thickening. Upper Abdomen: No acute abnormality. Musculoskeletal: No chest wall mass or suspicious bone lesions identified. IMPRESSION: 1. Moderate bilateral pleural effusions, slightly diminished in volume compared to prior examination. 2. Mild pulmonary edema. 3. Evaluation of pulmonary  fibrosis is substantially limited by the presence of pleural effusions and edema. Within this limitation, there is a pattern of mild to moderate pulmonary fibrosis featuring irregular peripheral interstitial opacity, likely a component of septal thickening underlying edema, and some evidence of subpleural bronchiolectasis at the lung bases. No evidence of honeycombing. No significant air trapping on expiratory phase imaging. Findings are consistent with a "probable UIP" pattern by ATS pulmonary fibrosis criteria but could be better assessed at the resolution of pleural effusions and edema. Findings are categorized as probable UIP per consensus guidelines: Diagnosis of Idiopathic Pulmonary Fibrosis: An Official ATS/ERS/JRS/ALAT Clinical Practice Guideline. [place], Iss 5, [phone], Jun 23 2017. 4. Numerous enlarged mediastinal and bilateral hilar lymph nodes, unchanged compared to prior, likely reactive. 5. Coronary artery disease. 6. Aortic valve calcifications. Correlate for echocardiographic evidence of aortic valve dysfunction Aortic Atherosclerosis (ICD10-I70.0). Electronically Signed   By: Eddie Candle M.D.   On: 03/13/2021 11:41    EKG: Normal sinus rhythm with nonspecific ST and T wave changes  Weights: There were no vitals filed for this  visit.   Physical Exam: Blood pressure (!) 163/45, pulse 61, temperature 98 F (36.7 C), resp. rate (!) 29, SpO2 95 %. There is no height or weight on file to calculate BMI. General: Well developed, well nourished, in no acute distress. Head eyes ears nose throat: Normocephalic, atraumatic, sclera non-icteric, no xanthomas, nares are without discharge. No apparent thyromegaly and/or mass  Lungs: Normal respiratory effort.  no wheezes, bibasilar rales, no rhonchi.  Heart: RRR with normal S1 S2. no murmur gallop, no rub, PMI is normal size and placement, carotid upstroke normal without bruit, jugular venous pressure is normal Abdomen: Soft, non-tender, non-distended with normoactive bowel sounds. No hepatomegaly. No rebound/guarding. No obvious abdominal masses. Abdominal aorta is normal size without bruit Extremities: Trace edema. no cyanosis, no clubbing, no ulcers  Peripheral : 2+ bilateral upper extremity pulses, 2+ bilateral femoral pulses, 2+ bilateral dorsal pedal pulse Neuro: Alert and oriented. No facial asymmetry. No focal deficit. Moves all extremities spontaneously. Musculoskeletal: Normal muscle tone without kyphosis Psych:  Responds to questions appropriately with a normal affect.    Assessment: 80 year old female with acute on chronic diastolic dysfunction congestive heart failure multifactorial in nature including chronic kidney disease stage IV with a glomerular filtration rate of 21 moderate aortic valve stenosis without current evidence of myocardial infarction or acute coronary syndrome  Plan: 1.  Furosemide intravenously for acute on chronic diastolic dysfunction congestive heart failure with pulmonary edema watching closely for worsening concerns of chronic kidney disease 2.  No further cardiac diagnostics due to recent echocardiogram showing normal LV systolic function with ejection fraction of 60% and no current evidence of myocardial infarction or acute coronary  syndrome 3.  Further addition of medication management for blood pressure control as necessary moving forward although there are concerns for worsening kidney function as well as bradycardia.  Will reintroduce medication management in a.m. depending on blood pressure and heart rate control 4.  Further treatment options after above  Signed, Corey Skains M.D. [facility] 03/15/2021, 5:22 PM

## 2021-03-15 NOTE — Telephone Encounter (Signed)
Disability parking placard completed by provider and given to patient on 03-15-21 [facility]

## 2021-03-16 ENCOUNTER — Telehealth: Payer: Self-pay

## 2021-03-16 DIAGNOSIS — N1832 Chronic kidney disease, stage 3b: Secondary | ICD-10-CM | POA: Diagnosis not present

## 2021-03-16 DIAGNOSIS — I480 Paroxysmal atrial fibrillation: Secondary | ICD-10-CM | POA: Diagnosis not present

## 2021-03-16 DIAGNOSIS — J9611 Chronic respiratory failure with hypoxia: Secondary | ICD-10-CM | POA: Diagnosis not present

## 2021-03-16 DIAGNOSIS — I5033 Acute on chronic diastolic (congestive) heart failure: Secondary | ICD-10-CM | POA: Diagnosis not present

## 2021-03-16 DIAGNOSIS — I350 Nonrheumatic aortic (valve) stenosis: Secondary | ICD-10-CM | POA: Diagnosis not present

## 2021-03-16 DIAGNOSIS — I509 Heart failure, unspecified: Secondary | ICD-10-CM | POA: Diagnosis not present

## 2021-03-16 DIAGNOSIS — I5022 Chronic systolic (congestive) heart failure: Secondary | ICD-10-CM | POA: Diagnosis not present

## 2021-03-16 LAB — BASIC METABOLIC PANEL
Anion gap: 10 (ref 5–15)
BUN: 33 mg/dL — ABNORMAL HIGH (ref 8–23)
CO2: 26 mmol/L (ref 22–32)
Calcium: 8.8 mg/dL — ABNORMAL LOW (ref 8.9–10.3)
Chloride: 102 mmol/L (ref 98–111)
Creatinine, Ser: 2.33 mg/dL — ABNORMAL HIGH (ref 0.44–1.00)
GFR, Estimated: 21 mL/min — ABNORMAL LOW (ref >60)
Glucose, Bld: 91 mg/dL (ref 70–99)
Potassium: 3.8 mmol/L (ref 3.5–5.1)
Sodium: 138 mmol/L (ref 135–145)

## 2021-03-16 LAB — HEMOGLOBIN A1C
Hgb A1c MFr Bld: 6.6 % — ABNORMAL HIGH (ref 4.8–5.6)
Mean Plasma Glucose: 143 mg/dL

## 2021-03-16 LAB — CBC
HCT: 27.6 % — ABNORMAL LOW (ref 36.0–46.0)
Hemoglobin: 9.1 g/dL — ABNORMAL LOW (ref 12.0–15.0)
MCH: 29.3 pg (ref 26.0–34.0)
MCHC: 33 g/dL (ref 30.0–36.0)
MCV: 88.7 fL (ref 80.0–100.0)
Platelets: 188 10*3/uL (ref 150–400)
RBC: 3.11 MIL/uL — ABNORMAL LOW (ref 3.87–5.11)
RDW: 17.1 % — ABNORMAL HIGH (ref 11.5–15.5)
WBC: 8.3 10*3/uL (ref 4.0–10.5)
nRBC: 0 % (ref 0.0–0.2)

## 2021-03-16 LAB — GLUCOSE, CAPILLARY
Glucose-Capillary: 100 mg/dL — ABNORMAL HIGH (ref 70–99)
Glucose-Capillary: 181 mg/dL — ABNORMAL HIGH (ref 70–99)

## 2021-03-16 LAB — PROTIME-INR
INR: 1.7 — ABNORMAL HIGH (ref 0.8–1.2)
Prothrombin Time: 20 seconds — ABNORMAL HIGH (ref 11.4–15.2)

## 2021-03-16 MED ORDER — FUROSEMIDE 40 MG PO TABS: 40.0000 mg | ORAL_TABLET | Freq: Every day | ORAL | 11 refills | Status: DC

## 2021-03-16 MED ORDER — WARFARIN SODIUM 3 MG PO TABS
3.0000 mg | ORAL_TABLET | Freq: Once | ORAL | Status: DC
  Filled 2021-03-16: qty 1

## 2021-03-16 MED ORDER — MAGNESIUM HYDROXIDE 400 MG/5ML PO SUSP
30.0000 mL | Freq: Once | ORAL | Status: AC
  Administered 2021-03-16: 30 mL via ORAL
  Filled 2021-03-16: qty 30

## 2021-03-16 MED ORDER — INSULIN ASPART 100 UNIT/ML IJ SOLN: 0.0000 [IU] | Freq: Every day | INTRAMUSCULAR | Status: DC

## 2021-03-16 MED ORDER — INSULIN ASPART 100 UNIT/ML IJ SOLN: 0.0000 [IU] | Freq: Three times a day (TID) | INTRAMUSCULAR | Status: DC

## 2021-03-16 NOTE — Consult Note (Signed)
[place] for warfarin Indication: atrial fibrillation  No Known Allergies  Patient Measurements: Weight: 55.4 kg (122 lb 2.2 oz)   Vital Signs: Temp: 98 F (36.7 C) (05/25 0449) Temp Source: Oral (05/24 1911) BP: 164/41 (05/25 0449) Pulse Rate: 56 (05/25 0449)  Labs: Recent Labs    03/15/21 1130 03/15/21 1533 03/16/21 0447  HGB 9.0*  --  9.1*  HCT 28.4*  --  27.6*  PLT 199  --  188  LABPROT  --  19.9* 20.0*  INR  --  1.7* 1.7*  CREATININE 2.31*  --  2.33*  TROPONINIHS 37* 35*  --     Estimated Creatinine Clearance: 14.4 mL/min (A) (by C-G formula based on SCr of 2.33 mg/dL (H)).   Medical History: Past Medical History:  Diagnosis Date  . Anemia   . Atrial fibrillation ([place])   . Chronic kidney disease   . Congestive heart failure (CHF) ([place])   . Diabetes mellitus without complication ([place])   . GERD (gastroesophageal reflux disease)   . Hyperlipidemia   . Hypertension   . Pneumonia   . Pulmonary fibrosis ([facility])     Medications:  Warfarin 2 mg daily   DDIs:   warfarin/ropinirole (ropinerole may increase INR while on warfarin) warfarin/citalopram (SSRIs may increase bleed risk) warfarin/acetominophen (may increase bleed risk)  warfarin/pantoprazole (may increase INR)   Assessment: 80 y.o. female with PMH significant for T2DM, osteoarthritis, nicotine dependence, CKD stage IV, GERD, diastolic heart failure, EF 60% on 12/2020, A. fib on warfarin, and anemia. Patient presents with SOB and weakness and admitted to hospital for management of CHF exacerbation. Pharmacy has been consulted for warfarin dosing.   Of note patient is consistently subtherapeutic on current regimen and her dose has not been increased, its unclear if a lower INR goal is being targeted outpatient.   Date   INR    Dose 5/24   1.7    4 mg  5/25   1.7    3 mg  Goal of Therapy:  INR 2-3 Monitor platelets by anticoagulation protocol: Yes   Plan:    INR is subtherapeutic: warfarin 3 mg x 1 tonight (this is 50% greater than home dose)  onitor daily INR, CBC, s/s of bleed  Dallie Piles, PharmD 03/16/2021,7:06 AM

## 2021-03-16 NOTE — Progress Notes (Signed)
AVS reviewed and pt verbalized understanding of all instructions. NAD noted prior to discharge and no concerns voiced. 

## 2021-03-16 NOTE — Consult Note (Signed)
Heart Failure Nurse Navigator Note  HFpEF status 65%.  Normal right ventricular systolic function.  Mild left atrial enlargement.  Mild aortic regurgitation with mild aortic stenosis.  She presented to the emergency room with complaints of a dry cough, lower extremity edema, increasing shortness of breath, weakness and poor appetite.   Comorbidities:  Anemia Atrial fibrillation Chronic kidney disease Diabetes Hypertension Hyperlipidemia Osteoarthritis  Labs:  Sodium 138, potassium 3.8, chloride 102, CO2 26, BUN 33, creatinine 2.3, BUN 780. Intake not documented Output 1400 mL Weight 55.4 kg  Assessment:  General-she is awake and alert sitting up in the chair at bedside in no acute distress.  HEENT-pupils are equal.  No JVD  Cardiac- heart tones are regular rate and rhythm.     Met with patient today, she states she is feeling much better than when she first was admitted.  She was last hospitalized February 06, 2021 with heart failure.  Discussed her diet, she does admit to eating canned  soups almost daily, she states she likes chicken with stars and chicken noodle.  Discussed not eating canned soups, looking for another food source and if she had to have soup to try a low sodium soups.    She also admits to to using salt at the table.  Discussed completely removing the saltshaker from the home.  Gave examples of other spices to use along with pepper which she states that she likes.  States that she lives with her daughter and granddaughter.  She was given follow-up appoint on June 3 with the heart failure clinic.   Pricilla Riffle RN [facility]

## 2021-03-16 NOTE — Progress Notes (Signed)
[facility] Cardiology [facility] Encounter Note  Patient: [name] / Admit Date: 03/15/2021 / Date of Encounter: 03/16/2021, 8:35 AM   Subjective: Patient is significantly improved from yesterday.  Much less shortness of breath weakness and work today.  Urine output is greater than 1.5 L.  Patient has had significant improvements of [place].  No evidence of myocardial infarction with troponin of 37/35.  Review of Systems: Positive for: Weakness Negative for: Vision change, hearing change, syncope, dizziness, nausea, vomiting,diarrhea, bloody stool, stomach pain, cough, congestion, diaphoresis, urinary frequency, urinary pain,skin lesions, skin rashes Others previously listed  Objective: Telemetry: Normal sinus rhythm Physical Exam: Blood pressure (!) 145/66, pulse (!) 57, temperature 97.7 F (36.5 C), temperature source Oral, resp. rate 20, weight 55.4 kg, SpO2 100 %. Body mass index is 25.53 kg/m. General: Well developed, well nourished, in no acute distress. Head: Normocephalic, atraumatic, sclera non-icteric, no xanthomas, nares are without discharge. Neck: No apparent masses Lungs: Normal respirations with no wheezes, no rhonchi, no rales , few crackles   Heart: Regular rate and rhythm, normal S1 S2, no murmur, no rub, no gallop, PMI is normal size and placement, carotid upstroke normal without bruit, jugular venous pressure normal Abdomen: Soft, non-tender, non-distended with normoactive bowel sounds. No hepatosplenomegaly. Abdominal aorta is normal size without bruit Extremities: Trace edema, no clubbing, no cyanosis, no ulcers,  Peripheral: 2+ radial, 2+ femoral, 2+ dorsal pedal pulses Neuro: Alert and oriented. Moves all extremities spontaneously. Psych:  Responds to questions appropriately with a normal affect.   Intake/Output Summary (Last 24 hours) at 03/16/2021 0835 Last data filed at 03/16/2021 0500 Gross per 24 hour  Intake --  Output 1400 ml  Net -1400 ml     Inpatient Medications:  . atorvastatin  10 mg Oral QPM  . citalopram  40 mg Oral Daily  . donepezil  5 mg Oral Daily  . ferrous sulfate  325 mg Oral Q breakfast  . fluticasone  1 spray Each Nare Daily  . furosemide  40 mg Intravenous Daily  . guaiFENesin  600 mg Oral BID  . insulin aspart  0-6 Units Subcutaneous TID AC & HS  . lisinopril  20 mg Oral Daily  . pantoprazole  40 mg Oral BID  . rOPINIRole  0.5 mg Oral QPM  . cyanocobalamin  1,000 mcg Oral Daily  . Warfarin - Pharmacist Dosing Inpatient   Does not apply q1600   Infusions:   Labs: Recent Labs    03/15/21 1130 03/16/21 0447  NA 139 138  K 4.2 3.8  CL 104 102  CO2 26 26  GLUCOSE 151* 91  BUN 30* 33*  CREATININE 2.31* 2.33*  CALCIUM 8.7* 8.8*   Recent Labs    03/15/21 1130  AST 19  ALT 11  ALKPHOS 66  BILITOT 0.6  PROT 6.1*  ALBUMIN 2.7*   Recent Labs    03/15/21 1130 03/16/21 0447  WBC 7.8 8.3  HGB 9.0* 9.1*  HCT 28.4* 27.6*  MCV 92.5 88.7  PLT 199 188   No results for input(s): CKTOTAL, CKMB, TROPONINI in the last 72 hours. Invalid input(s): POCBNP Recent Labs    03/15/21 1533  HGBA1C 6.6*     Weights: Filed Weights   03/16/21 0500  Weight: 55.4 kg     Radiology/Studies:  DG Chest 2 View  Result Date: 03/15/2021 CLINICAL DATA:  Shortness of breath EXAM: CHEST - 2 VIEW COMPARISON:  03/15/2021 FINDINGS: Chronic background interstitial changes. Superimposed mild edema remains possible. Small  bilateral pleural effusions with adjacent atelectasis. No pneumothorax. Stable cardiomediastinal contours. No acute osseous abnormality. IMPRESSION: Chronic fibrotic changes with possible superimposed mild edema. Appearance is similar. Small pleural effusions with adjacent atelectasis. Electronically Signed   By: Macy Mis M.D.   On: 03/15/2021 12:19   DG Chest 2 View  Result Date: 03/15/2021 CLINICAL DATA:  Short of breath.  Wheezing EXAM: CHEST - 2 VIEW COMPARISON:  02/06/2021 FINDINGS:  Normal heart size. Aortic atherosclerosis. Persistent bilateral pleural effusions. Diffusely increased reticular interstitial markings are again noted bilaterally. This likely reflects a combination of chronic interstitial lung disease and superimposed pulmonary edema. Atelectasis noted in the left lower lobe. IMPRESSION: 1. Suspect mild CHF superimposed upon chronic interstitial lung disease. 2.  Aortic Atherosclerosis (ICD10-I70.0). Electronically Signed   By: Kerby Moors M.D.   On: 03/15/2021 10:23   CT Chest Wo Contrast  Result Date: 03/13/2021 CLINICAL DATA:  IPF EXAM: CT CHEST WITHOUT CONTRAST TECHNIQUE: Multidetector CT imaging of the chest was performed following the standard protocol without IV contrast. COMPARISON:  02/06/2021 FINDINGS: Cardiovascular: Aortic atherosclerosis. Aortic valve calcifications. Normal heart size. Left coronary artery calcifications. No pericardial effusion. Mediastinum/Nodes: Numerous enlarged mediastinal and bilateral hilar lymph nodes, unchanged compared to prior, largest pretracheal nodes measuring 1.3 x 1.2 cm (series 2, image 48). Thyroid gland, trachea, and esophagus demonstrate no significant findings. Lungs/Pleura: Moderate bilateral pleural effusions, slightly diminished in volume compared to prior examination. Evaluation of pulmonary fibrosis is substantially limited by the presence of pleural effusions and edema. Within this limitation, there is a pattern of mild to moderate pulmonary fibrosis featuring irregular peripheral interstitial opacity, likely a component of septal thickening underlying edema, and some evidence of subpleural bronchiolectasis at the lung bases. No evidence of honeycombing. No significant air trapping on expiratory phase imaging. Diffuse bilateral bronchial wall thickening. Upper Abdomen: No acute abnormality. Musculoskeletal: No chest wall mass or suspicious bone lesions identified. IMPRESSION: 1. Moderate bilateral pleural effusions,  slightly diminished in volume compared to prior examination. 2. Mild pulmonary edema. 3. Evaluation of pulmonary fibrosis is substantially limited by the presence of pleural effusions and edema. Within this limitation, there is a pattern of mild to moderate pulmonary fibrosis featuring irregular peripheral interstitial opacity, likely a component of septal thickening underlying edema, and some evidence of subpleural bronchiolectasis at the lung bases. No evidence of honeycombing. No significant air trapping on expiratory phase imaging. Findings are consistent with a "probable UIP" pattern by ATS pulmonary fibrosis criteria but could be better assessed at the resolution of pleural effusions and edema. Findings are categorized as probable UIP per consensus guidelines: Diagnosis of Idiopathic Pulmonary Fibrosis: An Official ATS/ERS/JRS/ALAT Clinical Practice Guideline. [place], Iss 5, [phone], Jun 23 2017. 4. Numerous enlarged mediastinal and bilateral hilar lymph nodes, unchanged compared to prior, likely reactive. 5. Coronary artery disease. 6. Aortic valve calcifications. Correlate for echocardiographic evidence of aortic valve dysfunction Aortic Atherosclerosis (ICD10-I70.0). Electronically Signed   By: Eddie Candle M.D.   On: 03/13/2021 11:41     Assessment and Recommendation  80 y.o. female with known chronic kidney disease stage IV with glomerular filtration rate of 21 having acute on chronic diastolic dysfunction congestive heart failure and stable aortic valve stenosis now with significant improvements of symptoms after diuresis and no current evidence of myocardial infarction 1.  Continuation of intravenous Lasix this a.m. and change to oral Lasix 40 mg each day 2.  Continue hypertension control and treatment of diastolic dysfunction  with ACE inhibitor 3.  High intensity cholesterol therapy 4.  No further cardiac diagnostics necessary at this time 5.  Okay for discharged  home if ambulating well without further evidence of significant symptoms and follow-up next week  Signed, Serafina Royals M.D. FACC

## 2021-03-16 NOTE — TOC Initial Note (Addendum)
Transition of Care [facility]) - Initial/Assessment Note    Patient Details  Name: [name] MRN: [id number] Date of Birth: [birth year]  Transition of Care [facility]) CM/SW Contact:    Beverly Sessions, RN Phone Number: 03/16/2021, 12:37 PM  Clinical Narrative:                 Patient admitted from home with SOB Patient with history of CHF Patient lives at home with daughter  PCP Humphrey Rolls. - family transports to appointments.  Denies issues obtaining medications  Patient has RW and cane in the home Patient believes she is still open with home health services, but does not recall what agency.  Per chart review it appears referral was previously sent to [facility].  I have reached out to [place] with [facility] to determine if patient is still open. Awaiting response  PT has assessed patient and recommends home health.   At minimum patient will need home health orders for RN and PT at discharge  Patient states that she wears PRN home O2.  I confirmed with Zack at [facility] that patient has O2 through them, and that her orders are for 2L continuous    Expected Discharge Plan: [place] Barriers to Discharge: Continued Medical Work up   Patient Goals and CMS Choice        Expected Discharge Plan and Services Expected Discharge Plan: [place]       Living arrangements for the past 2 months: [place] Agency: [facility] Date [facility] Agency Contacted: 03/16/21      Prior Living Arrangements/Services Living arrangements for the past 2 months: Single Family Home Lives with:: Adult Children Patient language and need for interpreter reviewed:: Yes        Need for Family Participation in Patient Care: Yes (Comment) Care giver support system in place?: Yes (comment) Current home services: DME Criminal Activity/Legal Involvement Pertinent to Current Situation/Hospitalization: No - Comment as  needed  Activities of Daily Living      Permission Sought/Granted                  Emotional Assessment       Orientation: : Oriented to Self,Oriented to  Time,Oriented to Place,Oriented to Situation Alcohol / Substance Use: Not Applicable Psych Involvement: No (comment)  Admission diagnosis:  SOB (shortness of breath) [U20.25] Acute systolic (congestive) heart failure (HCC) [I50.21] Acute congestive heart failure, unspecified heart failure type ([place]) [I50.9] Patient Active Problem List   Diagnosis Date Noted  . SOB (shortness of breath) 03/15/2021  . Acute renal failure superimposed on stage 4 chronic kidney disease ([place]) 02/08/2021  . Anemia associated with stage 4 chronic renal failure ([place]) 02/08/2021  . Anemia due to stage 5 chronic kidney disease ([place]) 02/07/2021  . Acute CHF (congestive heart failure) ([place]) 02/06/2021  . Acute on chronic diastolic CHF (congestive heart failure) ([place]) 02/06/2021  . Hyperkalemia 02/06/2021  . Weakness 12/24/2020  . Hypertensive urgency 12/23/2020  . Nicotine dependence 12/23/2020  . History of anemia due to chronic kidney disease 12/23/2020  . Pain due to onychomycosis of toenails of both feet 02/12/2020  . Uncontrolled type 2 diabetes mellitus with hyperglycemia ([place]) 05/20/2019  . Acute recurrent pansinusitis 05/11/2019  . Acute otitis media 05/11/2019  . Acute pain of  left shoulder 05/11/2019  . Long term (current) use of anticoagulants 04/03/2019  . Chronic left shoulder pain 03/18/2019  . Sepsis ([place]) 01/12/2018  . Colitis 01/12/2018  . CKD (chronic kidney disease), stage III ([place]) 01/12/2018  . Diabetes ([place]) 12/28/2017  . AF (paroxysmal atrial fibrillation) ([place]) 12/28/2017  . Encounter for general adult medical examination with abnormal findings 12/28/2017  . Primary generalized (osteo)arthritis 12/28/2017  . Hypertension 12/27/2017  . GERD (gastroesophageal reflux disease) 12/27/2017  . Hematuria 03/18/2015  .  Chronic combined systolic and diastolic heart failure ([place]) 03/01/2012  . Obesity 03/01/2012  . CKD stage 4 due to type 2 diabetes mellitus ([place]) 03/01/2012  . Other benign neoplasm of connective and other soft tissue of lower limb, including hip 03/15/2011  . Neoplasm of connective tissue 02/01/2011  . Pain in joint, pelvic region and thigh 02/01/2011  . Type 2 diabetes mellitus with stage 5 chronic kidney disease ([place]) 10/03/2006  . Encounter for current long-term use of anticoagulants 02/07/2006  . Essential hypertension 09/24/2004  . Atrial fibrillation ([place]) 06/07/2004   PCP:  Lavera Guise, MD Pharmacy:   [facility] - [place], [place] [address] Phone: [phone] Fax: [phone]  [facility] [place] [address] ([address]), [place] - [place]) [place] [address] Phone: [phone] Fax: [phone]     Social Determinants of Health (SDOH) Interventions    Readmission Risk Interventions No flowsheet data found.

## 2021-03-16 NOTE — Care Management Obs Status (Signed)
[place] NOTIFICATION   Patient Details  Name: [name] MRN: [id number] Date of Birth: 1941-04-25   Medicare Observation Status Notification Given:  Yes    Beverly Sessions, RN 03/16/2021, 12:36 PM

## 2021-03-16 NOTE — TOC Transition Note (Addendum)
Transition of Care [facility]) - CM/SW Discharge Note   Patient Details  Name: [name] MRN: [id number] Date of Birth: 1941/09/12  Transition of Care [facility]) CM/SW Contact:  Beverly Sessions, RN Phone Number: 03/16/2021, 2:15 PM   Clinical Narrative:    Patient to discharge today [place] with [facility] confirmed patient was closed 4/30. They are unable to accept at this time due to staffing.  Followed up with patient she does not have a preference of home health agency.  Referral made to [facility]   VM left for daughter notifying her of discharge and instructed her to bring portable O2 for transport       Barriers to Discharge: Continued Medical Work up   Patient Goals and CMS Choice        Discharge Placement                       Discharge Plan and [place]: [facility] Date [facility] Agency Contacted: 03/16/21      Social Determinants of Health (SDOH) Interventions     Readmission Risk Interventions No flowsheet data found.

## 2021-03-16 NOTE — Plan of Care (Signed)

## 2021-03-16 NOTE — Telephone Encounter (Signed)
Left vm with ashley with [facility] asking if we can get a smaller portable oxygen for pt.

## 2021-03-16 NOTE — Discharge Summary (Signed)
Physician Discharge Summary  [name] ION:[id number] DOB: Feb 01, 1941 DOA: 03/15/2021  PCP: Lavera Guise, MD  Admit date: 03/15/2021 Discharge date: 03/16/2021  Admitted From: home Disposition:  home  Recommendations for Outpatient Follow-up:  1. Follow up with PCP in 1-2 weeks 2. Please obtain BMP/CBC in one week 3. Follow up with heart failure clinic scheduled for 6/3  Home Health:home health RN/PT Equipment/Devices:  Discharge Condition: stable CODE STATUS:full code Diet recommendation: heart healthy  Brief/Interim Summary: 80 y/o female admitted to the hospital with worsening shortness of breath and found to have decompensated CHF exacerbation. She was treated with IV lasix with improvement in respiratory status. Cardiology followed patient and has arranged outpatient follow up in heart failure clinic  Discharge Diagnoses:  Active Problems:   Diabetes (HCC)   AF (paroxysmal atrial fibrillation) (HCC)   CKD (chronic kidney disease), stage III (HCC)   Acute on chronic diastolic CHF (congestive heart failure) (HCC)   SOB (shortness of breath)  Acute on chronic diastolic chf -patient was treated with IV lasix -she was followed by cardiology in the hospital -urine output was good with IV lasix and she appears to be approaching her baseline -she was ambulated on oxygen at 2L and did not have any shortness of breath -it was recommended that she be transitioned to lasix 60m po daily -follow up in the heart failure clinic has been arranged  HTN -she is continued on outpatient dose of ACE -her creatinine appears to be stable on current dose  Chronic resp failure with hypoxia -chronically on 2L of oxygen continuous  CKD stage 3 -creatinine currently stable on lasix -repeat BMET in 1 week on follow up  A fibrillation -heart rate is currently controlled -she is on anticoagulation with coumadin   Discharge Instructions  Discharge Instructions    Amb Referral to HF  Clinic   Complete by: As directed    Diet - low sodium heart healthy   Complete by: As directed    Increase activity slowly   Complete by: As directed      Allergies as of 03/16/2021   No Known Allergies     Medication List    STOP taking these medications   digoxin 0.125 MG tablet Commonly known as: LANOXIN   metoprolol succinate 25 MG 24 hr tablet Commonly known as: Toprol XL     TAKE these medications   acetaminophen 325 MG tablet Commonly known as: TYLENOL Take 650 mg by mouth every 6 (six) hours as needed.   atorvastatin 10 MG tablet Commonly known as: LIPITOR Take 1 tablet (10 mg total) by mouth every evening.   benzonatate 200 MG capsule Commonly known as: TESSALON Take 1 capsule (200 mg total) by mouth 2 (two) times daily as needed for cough.   Blood Pressure Monitor Kit Use three times daily to check blood pressure  DX:  I50.42   citalopram 40 MG tablet Commonly known as: CELEXA Take 1 tablet (40 mg total) by mouth daily.   cyanocobalamin 1000 MCG tablet Take 1 tablet (1,000 mcg total) by mouth daily.   donepezil 5 MG tablet Commonly known as: ARICEPT TAKE 1 TABLET BY MOUTH ONCE DAILY FOR MEMORY What changed:   how much to take  how to take this  when to take this   Dulcolax 5 MG EC tablet Generic drug: bisacodyl Take 1 tablet (5 mg total) by mouth daily as needed for moderate constipation.   ferrous sulfate 325 (65 FE) MG tablet Take  1 tablet (325 mg total) by mouth daily with breakfast.   fluticasone 50 MCG/ACT nasal spray Commonly known as: FLONASE Place 1 spray into both nostrils daily.   furosemide 40 MG tablet Commonly known as: Lasix Take 1 tablet (40 mg total) by mouth daily.   hydrALAZINE 25 MG tablet Commonly known as: APRESOLINE Take 25 mg by mouth 3 (three) times daily as needed for high blood pressure. May take an additional tablet if BP is elevated. Hold if SBP less than 130 mmhg   lisinopril 20 MG tablet Commonly known  as: ZESTRIL Take 1 tablet (20 mg total) by mouth daily.   melatonin 3 MG Tabs tablet Take 3 mg by mouth at bedtime as needed (sleep).   pantoprazole 40 MG tablet Commonly known as: PROTONIX Take 1 tablet (40 mg total) by mouth 2 (two) times daily.   polyethylene glycol 17 g packet Commonly known as: MiraLax Take 17 g by mouth daily. What changed:   when to take this  reasons to take this   promethazine 12.5 MG tablet Commonly known as: PHENERGAN Take 1 tablet (12.5 mg total) by mouth every 8 (eight) hours as needed for nausea or vomiting.   rOPINIRole 0.5 MG tablet Commonly known as: REQUIP Take 1 tablet (0.5 mg total) by mouth every evening.   warfarin 2 MG tablet Commonly known as: COUMADIN Take 1 tablet (2 mg total) by mouth daily.       Follow-up Information    Corey Skains, MD Follow up in 1 week(s).   Specialty: Cardiology Contact information: [address] [facility] [place] [address] [phone]              No Known Allergies  Consultations:  Cardiology, Dr. Nehemiah Massed   Procedures/Studies: DG Chest 2 View  Result Date: 03/15/2021 CLINICAL DATA:  Shortness of breath EXAM: CHEST - 2 VIEW COMPARISON:  03/15/2021 FINDINGS: Chronic background interstitial changes. Superimposed mild edema remains possible. Small bilateral pleural effusions with adjacent atelectasis. No pneumothorax. Stable cardiomediastinal contours. No acute osseous abnormality. IMPRESSION: Chronic fibrotic changes with possible superimposed mild edema. Appearance is similar. Small pleural effusions with adjacent atelectasis. Electronically Signed   By: Macy Mis M.D.   On: 03/15/2021 12:19   DG Chest 2 View  Result Date: 03/15/2021 CLINICAL DATA:  Short of breath.  Wheezing EXAM: CHEST - 2 VIEW COMPARISON:  02/06/2021 FINDINGS: Normal heart size. Aortic atherosclerosis. Persistent bilateral pleural effusions. Diffusely increased reticular  interstitial markings are again noted bilaterally. This likely reflects a combination of chronic interstitial lung disease and superimposed pulmonary edema. Atelectasis noted in the left lower lobe. IMPRESSION: 1. Suspect mild CHF superimposed upon chronic interstitial lung disease. 2.  Aortic Atherosclerosis (ICD10-I70.0). Electronically Signed   By: Kerby Moors M.D.   On: 03/15/2021 10:23   CT Chest Wo Contrast  Result Date: 03/13/2021 CLINICAL DATA:  IPF EXAM: CT CHEST WITHOUT CONTRAST TECHNIQUE: Multidetector CT imaging of the chest was performed following the standard protocol without IV contrast. COMPARISON:  02/06/2021 FINDINGS: Cardiovascular: Aortic atherosclerosis. Aortic valve calcifications. Normal heart size. Left coronary artery calcifications. No pericardial effusion. Mediastinum/Nodes: Numerous enlarged mediastinal and bilateral hilar lymph nodes, unchanged compared to prior, largest pretracheal nodes measuring 1.3 x 1.2 cm (series 2, image 48). Thyroid gland, trachea, and esophagus demonstrate no significant findings. Lungs/Pleura: Moderate bilateral pleural effusions, slightly diminished in volume compared to prior examination. Evaluation of pulmonary fibrosis is substantially limited by the presence of pleural effusions and edema.  Within this limitation, there is a pattern of mild to moderate pulmonary fibrosis featuring irregular peripheral interstitial opacity, likely a component of septal thickening underlying edema, and some evidence of subpleural bronchiolectasis at the lung bases. No evidence of honeycombing. No significant air trapping on expiratory phase imaging. Diffuse bilateral bronchial wall thickening. Upper Abdomen: No acute abnormality. Musculoskeletal: No chest wall mass or suspicious bone lesions identified. IMPRESSION: 1. Moderate bilateral pleural effusions, slightly diminished in volume compared to prior examination. 2. Mild pulmonary edema. 3. Evaluation of pulmonary  fibrosis is substantially limited by the presence of pleural effusions and edema. Within this limitation, there is a pattern of mild to moderate pulmonary fibrosis featuring irregular peripheral interstitial opacity, likely a component of septal thickening underlying edema, and some evidence of subpleural bronchiolectasis at the lung bases. No evidence of honeycombing. No significant air trapping on expiratory phase imaging. Findings are consistent with a "probable UIP" pattern by ATS pulmonary fibrosis criteria but could be better assessed at the resolution of pleural effusions and edema. Findings are categorized as probable UIP per consensus guidelines: Diagnosis of Idiopathic Pulmonary Fibrosis: An Official ATS/ERS/JRS/ALAT Clinical Practice Guideline. [place], Iss 5, [id number], Jun 23 2017. 4. Numerous enlarged mediastinal and bilateral hilar lymph nodes, unchanged compared to prior, likely reactive. 5. Coronary artery disease. 6. Aortic valve calcifications. Correlate for echocardiographic evidence of aortic valve dysfunction Aortic Atherosclerosis (ICD10-I70.0). Electronically Signed   By: Eddie Candle M.D.   On: 03/13/2021 11:41      Subjective: Feeling better. No shortness of breath on ambulation with oxygen  Discharge Exam: Vitals:   03/16/21 0449 03/16/21 0500 03/16/21 0817 03/16/21 1135  BP: (!) 164/41  (!) 145/66 (!) 152/43  Pulse: (!) 56  (!) 57 (!) 54  Resp: _0 Temp: 98 F (36.7 C)  97.7 F (36.5 C) 98.1 F (36.7 C)  TempSrc:   Oral Oral  SpO2: 100%  100% 100%  Weight:  55.4 kg      General: Pt is alert, awake, not in acute distress Cardiovascular: RRR, S1/S2 +, no rubs, no gallops Respiratory: CTA bilaterally, no wheezing, no rhonchi Abdominal: Soft, NT, ND, bowel sounds + Extremities: no edema, no cyanosis    The results of significant diagnostics from this hospitalization (including imaging, microbiology, ancillary and laboratory) are  listed below for reference.     Microbiology: Recent Results (from the past 240 hour(s))  Resp Panel by RT-PCR (Flu A&B, Covid) Nasopharyngeal Swab     Status: None   Collection Time: 03/15/21 12:36 PM   Specimen: Nasopharyngeal Swab; Nasopharyngeal(NP) swabs in vial transport medium  Result Value Ref Range Status   SARS Coronavirus 2 by RT PCR NEGATIVE NEGATIVE Final    Comment: (NOTE) SARS-CoV-2 target nucleic acids are NOT DETECTED.  The SARS-CoV-2 RNA is generally detectable in upper respiratory specimens during the acute phase of infection. The lowest concentration of SARS-CoV-2 viral copies this assay can detect is 138 copies/mL. A negative result does not preclude SARS-Cov-2 infection and should not be used as the sole basis for treatment or other patient management decisions. A negative result may occur with  improper specimen collection/handling, submission of specimen other than nasopharyngeal swab, presence of viral mutation(s) within the areas targeted by this assay, and inadequate number of viral copies(<138 copies/mL). A negative result must be combined with clinical observations, patient history, and epidemiological information. The expected result is Negative.  Fact Sheet for Patients:  EntrepreneurPulse.com.au  Fact Sheet for Healthcare Providers:  IncredibleEmployment.be  This test is no t yet approved or cleared by the [place] FDA and  has been authorized for detection and/or diagnosis of SARS-CoV-2 by FDA under an Emergency Use Authorization (EUA). This EUA will remain  in effect (meaning this test can be used) for the duration of the COVID-19 declaration under Section 564(b)(1) of the Act, 21 U.S.C.section 360bbb-3(b)(1), unless the authorization is terminated  or revoked sooner.       Influenza A by PCR NEGATIVE NEGATIVE Final   Influenza B by PCR NEGATIVE NEGATIVE Final    Comment: (NOTE) The Xpert Xpress  SARS-CoV-2/FLU/RSV plus assay is intended as an aid in the diagnosis of influenza from Nasopharyngeal swab specimens and should not be used as a sole basis for treatment. Nasal washings and aspirates are unacceptable for Xpert Xpress SARS-CoV-2/FLU/RSV testing.  Fact Sheet for Patients: EntrepreneurPulse.com.au  Fact Sheet for Healthcare Providers: IncredibleEmployment.be  This test is not yet approved or cleared by the [place] FDA and has been authorized for detection and/or diagnosis of SARS-CoV-2 by FDA under an Emergency Use Authorization (EUA). This EUA will remain in effect (meaning this test can be used) for the duration of the COVID-19 declaration under Section 564(b)(1) of the Act, 21 U.S.C. section 360bbb-3(b)(1), unless the authorization is terminated or revoked.  Performed at [facility], [address] [place], [place] [address]      Labs: BNP (last 3 results) Recent Labs    02/06/21 0503 03/15/21 1130  BNP 233.7* 956.3*   Basic Metabolic Panel: Recent Labs  Lab 03/15/21 1130 03/16/21 0447  NA 139 138  K 4.2 3.8  CL 104 102  CO2 26 26  GLUCOSE 151* 91  BUN 30* 33*  CREATININE 2.31* 2.33*  CALCIUM 8.7* 8.8*   Liver Function Tests: Recent Labs  Lab 03/15/21 1130  AST 19  ALT 11  ALKPHOS 66  BILITOT 0.6  PROT 6.1*  ALBUMIN 2.7*   No results for input(s): LIPASE, AMYLASE in the last 168 hours. No results for input(s): AMMONIA in the last 168 hours. CBC: Recent Labs  Lab 03/15/21 1130 03/16/21 0447  WBC 7.8 8.3  HGB 9.0* 9.1*  HCT 28.4* 27.6*  MCV 92.5 88.7  PLT 199 188   Cardiac Enzymes: No results for input(s): CKTOTAL, CKMB, CKMBINDEX, TROPONINI in the last 168 hours. BNP: Invalid input(s): POCBNP CBG: Recent Labs  Lab 03/15/21 1724 03/15/21 2137 03/16/21 0736 03/16/21 1134  GLUCAP 96 227* 100* 181*   D-Dimer No results for input(s): DDIMER in the last 72 hours. Hgb  A1c Recent Labs    03/15/21 1533  HGBA1C 6.6*   Lipid Profile No results for input(s): CHOL, HDL, LDLCALC, TRIG, CHOLHDL, LDLDIRECT in the last 72 hours. Thyroid function studies No results for input(s): TSH, T4TOTAL, T3FREE, THYROIDAB in the last 72 hours.  Invalid input(s): FREET3 Anemia work up No results for input(s): VITAMINB12, FOLATE, FERRITIN, TIBC, IRON, RETICCTPCT in the last 72 hours. Urinalysis    Component Value Date/Time   COLORURINE YELLOW (A) 12/23/2020 1040   APPEARANCEUR HAZY (A) 12/23/2020 1040   APPEARANCEUR Cloudy (A) 03/24/2020 0000   LABSPEC 1.013 12/23/2020 1040   PHURINE 6.0 12/23/2020 1040   GLUCOSEU 150 (A) 12/23/2020 1040   HGBUR MODERATE (A) 12/23/2020 1040   BILIRUBINUR NEGATIVE 12/23/2020 1040   BILIRUBINUR Negative 03/24/2020 0000   KETONESUR NEGATIVE 12/23/2020 1040   PROTEINUR >=300 (A) 12/23/2020 1040   UROBILINOGEN 0.2 09/02/2019 1441  NITRITE NEGATIVE 12/23/2020 1040   LEUKOCYTESUR NEGATIVE 12/23/2020 1040   Sepsis Labs Invalid input(s): PROCALCITONIN,  WBC,  LACTICIDVEN Microbiology Recent Results (from the past 240 hour(s))  Resp Panel by RT-PCR (Flu A&B, Covid) Nasopharyngeal Swab     Status: None   Collection Time: 03/15/21 12:36 PM   Specimen: Nasopharyngeal Swab; Nasopharyngeal(NP) swabs in vial transport medium  Result Value Ref Range Status   SARS Coronavirus 2 by RT PCR NEGATIVE NEGATIVE Final    Comment: (NOTE) SARS-CoV-2 target nucleic acids are NOT DETECTED.  The SARS-CoV-2 RNA is generally detectable in upper respiratory specimens during the acute phase of infection. The lowest concentration of SARS-CoV-2 viral copies this assay can detect is 138 copies/mL. A negative result does not preclude SARS-Cov-2 infection and should not be used as the sole basis for treatment or other patient management decisions. A negative result may occur with  improper specimen collection/handling, submission of specimen other than  nasopharyngeal swab, presence of viral mutation(s) within the areas targeted by this assay, and inadequate number of viral copies(<138 copies/mL). A negative result must be combined with clinical observations, patient history, and epidemiological information. The expected result is Negative.  Fact Sheet for Patients:  EntrepreneurPulse.com.au  Fact Sheet for Healthcare Providers:  IncredibleEmployment.be  This test is no t yet approved or cleared by the [place] FDA and  has been authorized for detection and/or diagnosis of SARS-CoV-2 by FDA under an Emergency Use Authorization (EUA). This EUA will remain  in effect (meaning this test can be used) for the duration of the COVID-19 declaration under Section 564(b)(1) of the Act, 21 U.S.C.section 360bbb-3(b)(1), unless the authorization is terminated  or revoked sooner.       Influenza A by PCR NEGATIVE NEGATIVE Final   Influenza B by PCR NEGATIVE NEGATIVE Final    Comment: (NOTE) The Xpert Xpress SARS-CoV-2/FLU/RSV plus assay is intended as an aid in the diagnosis of influenza from Nasopharyngeal swab specimens and should not be used as a sole basis for treatment. Nasal washings and aspirates are unacceptable for Xpert Xpress SARS-CoV-2/FLU/RSV testing.  Fact Sheet for Patients: EntrepreneurPulse.com.au  Fact Sheet for Healthcare Providers: IncredibleEmployment.be  This test is not yet approved or cleared by the [place] FDA and has been authorized for detection and/or diagnosis of SARS-CoV-2 by FDA under an Emergency Use Authorization (EUA). This EUA will remain in effect (meaning this test can be used) for the duration of the COVID-19 declaration under Section 564(b)(1) of the Act, 21 U.S.C. section 360bbb-3(b)(1), unless the authorization is terminated or revoked.  Performed at [facility], [address], [place], [place]  [address]      Time coordinating discharge: 12mns  SIGNED:   JKathie Dike MD  [facility] 03/16/2021, 2:11 PM   If 7PM-7AM, please contact night-coverage [email or web address]

## 2021-03-16 NOTE — Progress Notes (Signed)
Initial Nutrition Assessment  DOCUMENTATION CODES:   Not applicable  INTERVENTION:   Ensure Enlive po BID, each supplement provides 350 kcal and 20 grams of protein  MVI po daily   Dysphagia 3 diet  NUTRITION DIAGNOSIS:   Increased nutrient needs related to chronic illness (CHF, pulmonary fibrosis) as evidenced by estimated needs.  GOAL:   Patient will meet greater than or equal to 90% of their needs  MONITOR:   PO intake,Supplement acceptance,Labs,Weight trends,Skin,I & O's  REASON FOR ASSESSMENT:   Malnutrition Screening Tool    ASSESSMENT:   80 y.o. female with medical history significant of type 2 diabetes, osteoarthritis, nicotine dependence, CKD stage IV, GERD, diastolic heart failure, EF 60% on 12/2020, A. fib on warfarin, anemia, COPD?, lung fibrosis on 2 L nasal oxygen at night who presents with shortness of breath and weakness   Met with pt in room today. Pt reports good appetite and oral intake pta and in hospital; pt eating 100% of meals in hospital. Pt reports that she used to drink chocolate Ensure at home but reports that she stopped drinking this as she believes it was causing constipation. RD discussed with pt the importance of adequate nutrition needed to preserve lean muscle. Pt reports that she would like to try drinking Ensure again. Pt also reports that she threw her dentures away many years ago by mistake; pt requires a mechanical soft diet. Pt scheduled to discharge today. RD will add supplements if pt does not discharge. Per chart, pt is weight stable pta.   Medications reviewed and include: celexa, ferrous sulfate, lasix, insulin, B12, warfarin  Labs reviewed: K 3.8 wnl, BUN 33(H), creat 2.33(H) Hgb 9.1(L), Hct 27.6(L) cbgs- 100, 181 x 24 hrs AIC 6.6(H)- 5/24  NUTRITION - FOCUSED PHYSICAL EXAM:  Flowsheet Row Most Recent Value  Orbital Region No depletion  Upper Arm Region Moderate depletion  Thoracic and Lumbar Region No depletion  Buccal  Region No depletion  Temple Region Mild depletion  Clavicle Bone Region Severe depletion  Clavicle and Acromion Bone Region Severe depletion  Scapular Bone Region No depletion  Dorsal Hand Mild depletion  Patellar Region Severe depletion  Anterior Thigh Region Severe depletion  Posterior Calf Region Severe depletion  Edema (RD Assessment) None  Hair Reviewed  Eyes Reviewed  Mouth Reviewed  Skin Reviewed  Nails Reviewed     Diet Order:   Diet Order            Diet - low sodium heart healthy           Diet renal/carb modified with fluid restriction Diet-HS Snack? Nothing; Fluid restriction: 2000 mL Fluid; Room service appropriate? Yes; Fluid consistency: Thin  Diet effective now                EDUCATION NEEDS:   Education needs have been addressed  Skin:  Skin Assessment: Reviewed RN Assessment  Last BM:  pta  Height:   Ht Readings from Last 1 Encounters:  03/15/21 4' 10" (1.473 m)    Weight:   Wt Readings from Last 1 Encounters:  03/16/21 55.4 kg    Ideal Body Weight:  44 kg  BMI:  Body mass index is 25.53 kg/m.  Estimated Nutritional Needs:   Kcal:  1300-1500kcal/day  Protein:  65-75g/day  Fluid:  1.1-1.3L/day    MS, RD, LDN Please refer to AMION for RD and/or RD on-call/weekend/after hours pager  

## 2021-03-16 NOTE — Evaluation (Signed)
Physical Therapy Evaluation Patient Details Name: [name] MRN: [id number] DOB: 1941/03/31 Today's Date: 03/16/2021   History of Present Illness  Pt admitted for SOB symptoms. History includes DM, OA, CKD, GERD, and CHF.  Clinical Impression  Pt is a pleasant 80 year old female who was admitted for SOB symptoms. Pt performs bed mobility with supervision and transfers/ambulation with cga and SPC. Pt demonstrates deficits with strength/balance/endurance. Pt reports when her daughter isn't home, she has another daughter who provides assistance making care 24/7. Reports no falls. Does demonstrate slight SOB symptoms with exertion. Would benefit from skilled PT to address above deficits and promote optimal return to PLOF. Recommend transition to [place] upon discharge from acute hospitalization.  SaO2 on room air at rest = 98% SaO2 on room air while ambulating = 83% SaO2 on 2 liters of O2 while ambulating = 98%      Follow Up Recommendations Home health PT    Equipment Recommendations  None recommended by PT    Recommendations for Other Services       Precautions / Restrictions Precautions Precautions: Fall Restrictions Weight Bearing Restrictions: No      Mobility  Bed Mobility Overal bed mobility: Needs Assistance Bed Mobility: Supine to Sit     Supine to sit: Supervision     General bed mobility comments: safe technique with ease of transfer. Once seated, upright posture noted    Transfers Overall transfer level: Needs assistance Equipment used: Straight cane Transfers: Sit to/from Stand Sit to Stand: Min guard         General transfer comment: Unsteady with slight tremors noted. Once standing, upright posture noted  Ambulation/Gait Ambulation/Gait assistance: Min guard Gait Distance (Feet): 150 Feet Assistive device: Straight cane Gait Pattern/deviations: Step-through pattern     General Gait Details: takes frequent rest breaks secondary to fatigue.  Uses SPC safely. Slightly unsteady with resting tremors.  Stairs            Wheelchair Mobility    Modified Rankin (Stroke Patients Only)       Balance Overall balance assessment: Needs assistance Sitting-balance support: No upper extremity supported Sitting balance-Leahy Scale: Good     Standing balance support: Single extremity supported Standing balance-Leahy Scale: Fair                               Pertinent Vitals/Pain Pain Assessment: No/denies pain    Home Living Family/patient expects to be discharged to:: Private residence Living Arrangements: Children Available Help at Discharge: Family;Available 24 hours/day Type of Home: Mobile home Home Access: Ramped entrance     Home Layout: One level Home Equipment: [place] - single point;Walker - 2 wheels Additional Comments: reports she uses SPC primarily    Prior Function Level of Independence: Needs assistance         Comments: reports no recent falls     Hand Dominance        Extremity/Trunk Assessment   Upper Extremity Assessment Upper Extremity Assessment: Generalized weakness (B UE grossly 4/5)    Lower Extremity Assessment Lower Extremity Assessment: Generalized weakness (B LE grossly 4/5)       Communication   Communication: HOH  Cognition Arousal/Alertness: Awake/alert Behavior During Therapy: WFL for tasks assessed/performed Overall Cognitive Status: Within Functional Limits for tasks assessed  General Comments      Exercises Other Exercises Other Exercises: ambulated to bathroom. Needs min assist for transfer and eccentric control to toilet. +BM. Needs supervision for hygiene. Able to ambulate to sink for hygiene with min assist   Assessment/Plan    PT Assessment Patient needs continued PT services  PT Problem List Decreased strength;Decreased balance;Decreased mobility;Cardiopulmonary status limiting  activity       PT Treatment Interventions Gait training;Therapeutic exercise;Balance training    PT Goals (Current goals can be found in the Care Plan section)  Acute Rehab PT Goals Patient Stated Goal: to go home PT Goal Formulation: With patient Time For Goal Achievement: 03/30/21 Potential to Achieve Goals: Good    Frequency Min 2X/week   Barriers to discharge        Co-evaluation               AM-PAC PT "6 Clicks" Mobility  Outcome Measure Help needed turning from your back to your side while in a flat bed without using bedrails?: None Help needed moving from lying on your back to sitting on the side of a flat bed without using bedrails?: None Help needed moving to and from a bed to a chair (including a wheelchair)?: A Little Help needed standing up from a chair using your arms (e.g., wheelchair or bedside chair)?: A Little Help needed to walk in hospital room?: A Little Help needed climbing 3-5 steps with a railing? : A Little 6 Click Score: 20    End of Session Equipment Utilized During Treatment: Gait belt;Oxygen Activity Tolerance: Patient tolerated treatment well Patient left: in chair (no green alarm box, pad under pt. RN notified) Nurse Communication: Mobility status PT Visit Diagnosis: Unsteadiness on feet (R26.81);Muscle weakness (generalized) (M62.81);Difficulty in walking, not elsewhere classified (R26.2)    Time: 1031-5945 PT Time Calculation (min) (ACUTE ONLY): 30 min   Charges:   PT Evaluation $PT Eval Low Complexity: 1 Low PT Treatments $Therapeutic Activity: 8-22 mins        Greggory Stallion, PT, DPT [phone]   [name] 03/16/2021, 1:30 PM

## 2021-03-17 ENCOUNTER — Ambulatory Visit (INDEPENDENT_AMBULATORY_CARE_PROVIDER_SITE_OTHER): Payer: Medicare HMO

## 2021-03-17 DIAGNOSIS — Z7901 Long term (current) use of anticoagulants: Secondary | ICD-10-CM

## 2021-03-17 NOTE — Progress Notes (Signed)
INR today was 1.9 and Per DFK pt to continue taking 2 mg daily  and repeat in 1 week.  informed pt's daughter June about the change in dose for today.  Pt's daughter asked about lasix.  DFK had put pt on 40 mg for 2 days before pt went into hospital, and daughter explained that he changed pt to 40 mg of lasix daily.  DFK states to have pt take lasix 40 mg every other day and on off days do 20 mg.  Pt needs to see DFK in the up coming week.  Pt's daughter asking for a letter advising that pt can go to the [facility] and continue her activities there, I asked Tat to write the letter and we fax it to [phone].

## 2021-03-20 DIAGNOSIS — I110 Hypertensive heart disease with heart failure: Secondary | ICD-10-CM | POA: Diagnosis not present

## 2021-03-20 DIAGNOSIS — I5033 Acute on chronic diastolic (congestive) heart failure: Secondary | ICD-10-CM | POA: Diagnosis not present

## 2021-03-20 DIAGNOSIS — I480 Paroxysmal atrial fibrillation: Secondary | ICD-10-CM | POA: Diagnosis not present

## 2021-03-20 DIAGNOSIS — I5022 Chronic systolic (congestive) heart failure: Secondary | ICD-10-CM | POA: Diagnosis not present

## 2021-03-20 DIAGNOSIS — I2510 Atherosclerotic heart disease of native coronary artery without angina pectoris: Secondary | ICD-10-CM | POA: Diagnosis not present

## 2021-03-20 DIAGNOSIS — N185 Chronic kidney disease, stage 5: Secondary | ICD-10-CM | POA: Diagnosis not present

## 2021-03-20 DIAGNOSIS — D631 Anemia in chronic kidney disease: Secondary | ICD-10-CM | POA: Diagnosis not present

## 2021-03-20 DIAGNOSIS — E1122 Type 2 diabetes mellitus with diabetic chronic kidney disease: Secondary | ICD-10-CM | POA: Diagnosis not present

## 2021-03-20 DIAGNOSIS — J449 Chronic obstructive pulmonary disease, unspecified: Secondary | ICD-10-CM | POA: Diagnosis not present

## 2021-03-22 ENCOUNTER — Ambulatory Visit: Payer: Medicare HMO | Admitting: Internal Medicine

## 2021-03-22 LAB — PROTIME-INR

## 2021-03-23 ENCOUNTER — Ambulatory Visit: Payer: Medicare HMO | Admitting: Internal Medicine

## 2021-03-24 ENCOUNTER — Telehealth: Payer: Self-pay

## 2021-03-24 ENCOUNTER — Ambulatory Visit (INDEPENDENT_AMBULATORY_CARE_PROVIDER_SITE_OTHER): Payer: Medicare HMO

## 2021-03-24 DIAGNOSIS — Z7901 Long term (current) use of anticoagulants: Secondary | ICD-10-CM | POA: Diagnosis not present

## 2021-03-24 NOTE — Progress Notes (Signed)
Pt INR 1.2 as per Dr Humphrey Rolls pt daughter continue same med for now and keep follow up

## 2021-03-24 NOTE — Telephone Encounter (Signed)
Left vm instructing patient to arrive @ 9:40 for 03-28-21 appointment-Toni

## 2021-03-25 ENCOUNTER — Ambulatory Visit: Payer: Medicare HMO | Admitting: Family

## 2021-03-25 LAB — PROTIME-INR

## 2021-03-27 NOTE — Procedures (Signed)
[facility] [address] [place], [address]    Complete Pulmonary Function Testing Interpretation:  FINDINGS:  The forced vital capacity is moderately decreased.  FEV1 is moderately decreased.  F1 FVC ratio is mildly decreased.  Postbronchodilator no significant change in FEV1 clinical improvement may still occur in the absence of spirometric improvement.  The total lung capacity is mildly decreased residual volume is increased residual internal capacity ratio is increased.  FRC is normal.  DLCO was severely decreased.  IMPRESSION:  Pulmonary function study is suggestive of mixed mild restrictive lung disease and moderate obstructive lung disease.  Clinical correlation is recommended  Allyne Gee, MD [facility] Pulmonary Critical Care Medicine Sleep Medicine

## 2021-03-28 ENCOUNTER — Other Ambulatory Visit: Payer: Self-pay

## 2021-03-28 ENCOUNTER — Ambulatory Visit (INDEPENDENT_AMBULATORY_CARE_PROVIDER_SITE_OTHER): Payer: Medicare HMO | Admitting: Nurse Practitioner

## 2021-03-28 ENCOUNTER — Encounter: Payer: Self-pay | Admitting: Nurse Practitioner

## 2021-03-28 ENCOUNTER — Ambulatory Visit
Admission: RE | Admit: 2021-03-28 | Discharge: 2021-03-28 | Disposition: A | Discharge status: Home / Self Care | Payer: Medicare HMO | Source: Ambulatory Visit | Attending: Nurse Practitioner | Admitting: Nurse Practitioner

## 2021-03-28 ENCOUNTER — Ambulatory Visit
Admission: RE | Admit: 2021-03-28 | Discharge: 2021-03-28 | Disposition: A | Discharge status: Home / Self Care | Payer: Medicare HMO | Attending: Nurse Practitioner | Admitting: Nurse Practitioner

## 2021-03-28 VITALS — BP 118/54 | HR 107 | Temp 97.4°F | Resp 16 | Ht <= 58 in | Wt 116.4 lb

## 2021-03-28 DIAGNOSIS — Z124 Encounter for screening for malignant neoplasm of cervix: Secondary | ICD-10-CM

## 2021-03-28 DIAGNOSIS — R1902 Left upper quadrant abdominal swelling, mass and lump: Secondary | ICD-10-CM

## 2021-03-28 DIAGNOSIS — Z0001 Encounter for general adult medical examination with abnormal findings: Secondary | ICD-10-CM | POA: Diagnosis not present

## 2021-03-28 DIAGNOSIS — R300 Dysuria: Secondary | ICD-10-CM

## 2021-03-28 DIAGNOSIS — R109 Unspecified abdominal pain: Secondary | ICD-10-CM | POA: Diagnosis not present

## 2021-03-28 DIAGNOSIS — I5042 Chronic combined systolic (congestive) and diastolic (congestive) heart failure: Secondary | ICD-10-CM

## 2021-03-28 DIAGNOSIS — E1165 Type 2 diabetes mellitus with hyperglycemia: Secondary | ICD-10-CM

## 2021-03-28 DIAGNOSIS — I10 Essential (primary) hypertension: Secondary | ICD-10-CM

## 2021-03-28 DIAGNOSIS — R1012 Left upper quadrant pain: Secondary | ICD-10-CM | POA: Diagnosis not present

## 2021-03-28 DIAGNOSIS — E782 Mixed hyperlipidemia: Secondary | ICD-10-CM

## 2021-03-28 DIAGNOSIS — M150 Primary generalized (osteo)arthritis: Secondary | ICD-10-CM | POA: Diagnosis not present

## 2021-03-28 MED ORDER — TRAMADOL HCL 50 MG PO TABS: 50.0000 mg | ORAL_TABLET | Freq: Two times a day (BID) | ORAL | 2 refills | Status: DC | PRN

## 2021-03-28 NOTE — Progress Notes (Signed)
[facility] [place], [place] [address]  Internal MEDICINE  Office Visit Note  Patient Name: [name]  [id number]  [id number]  Date of Service: 04/01/2021  Chief Complaint  Patient presents with   Medicare Wellness    Paperwork, discuss heart rate, refills   Diabetes   Gastroesophageal Reflux   Hyperlipidemia   Hypertension   Anemia   Quality Metric Gaps    Pneumovax, shingrix, eye exam pt refused    HPI [name] presents for an annual well visit and physical exam. she has a history of anemia, CHF, hyperlipidemia, hyerptension, CKD, GERD, and diabetes.  [name] was seen in the ED on Mar 16, 2991 for acute systolic congestive heart failure.  Since then she has been taken off digoxin.  The hospital discharged her on metoprolol which was discontinued by Dr. Clayborn Bigness.  Her heart rate had been normal but recently has been creeping up.  Her heart rate at today's visit is 107.  Her daughter accompanied her to office visit today and reports that her heart rate ranges from 100-125 at home.  She currently lives at home with her daughter. Yearly eye exams are no longer recommended, the patient is blind.  She has had some loss of hearing and is followed by the audiologist.  The patient and her daughter are inquiring if she can get hearing aids and if they would be covered by her insurance.  The patient's daughter is considering purchasing a sound amplifier that attaches to the back of the ears that she found at [employer].  The patient's skin is very thin and fragile she has a small skin tear to her left forearm.  She is due for her diabetic foot exam.  Her A1c was last checked on May 24 and it was 6.6.  The patient has a form that needs to be completed and signed by her provider to be sent to the adult day services program that she attends.  The patient and her daughter were informed that the form will be filled out soon as possible and faxed to the adult day services program that  she attends.  Current Medication: Outpatient Encounter Medications as of 03/28/2021  Medication Sig Note   traMADol (ULTRAM) 50 MG tablet Take 1 tablet (50 mg total) by mouth 2 (two) times daily as needed for up to 5 days.    acetaminophen (TYLENOL) 325 MG tablet Take 650 mg by mouth every 6 (six) hours as needed.    atorvastatin (LIPITOR) 10 MG tablet Take 1 tablet (10 mg total) by mouth every evening.    benzonatate (TESSALON) 200 MG capsule Take 1 capsule (200 mg total) by mouth 2 (two) times daily as needed for cough.    bisacodyl (DULCOLAX) 5 MG EC tablet Take 1 tablet (5 mg total) by mouth daily as needed for moderate constipation.    Blood Pressure Monitor KIT Use three times daily to check blood pressure  DX:  I50.42    citalopram (CELEXA) 40 MG tablet Take 1 tablet (40 mg total) by mouth daily.    donepezil (ARICEPT) 5 MG tablet TAKE 1 TABLET BY MOUTH ONCE DAILY FOR MEMORY (Patient taking differently: Take 5 mg by mouth daily. TAKE 1 TABLET BY MOUTH ONCE DAILY FOR MEMORY)    ferrous sulfate 325 (65 FE) MG tablet Take 1 tablet (325 mg total) by mouth daily with breakfast.    fluticasone (FLONASE) 50 MCG/ACT nasal spray Place 1 spray into both nostrils daily.  furosemide (LASIX) 40 MG tablet Take 1 tablet (40 mg total) by mouth daily.    hydrALAZINE (APRESOLINE) 25 MG tablet Take 25 mg by mouth 3 (three) times daily as needed for high blood pressure. May take an additional tablet if BP is elevated. Hold if SBP less than 130 mmhg 03/15/2021: Patient takes twice daily in the morning and in the evening   lisinopril (ZESTRIL) 20 MG tablet Take 1 tablet (20 mg total) by mouth daily.    melatonin 3 MG TABS tablet Take 3 mg by mouth at bedtime as needed (sleep).    pantoprazole (PROTONIX) 40 MG tablet Take 1 tablet (40 mg total) by mouth 2 (two) times daily.    polyethylene glycol (MIRALAX) 17 g packet Take 17 g by mouth daily. (Patient taking differently: Take 17 g by mouth daily as needed for  mild constipation.)    promethazine (PHENERGAN) 12.5 MG tablet Take 1 tablet (12.5 mg total) by mouth every 8 (eight) hours as needed for nausea or vomiting.    rOPINIRole (REQUIP) 0.5 MG tablet Take 1 tablet (0.5 mg total) by mouth every evening.    vitamin B-12 1000 MCG tablet Take 1 tablet (1,000 mcg total) by mouth daily.    warfarin (COUMADIN) 2 MG tablet Take 1 tablet (2 mg total) by mouth daily.    [DISCONTINUED] ACCU-CHEK FASTCLIX LANCETS MISC  (Patient not taking: Reported on 12/23/2020)    [DISCONTINUED] acetaminophen (TYLENOL) 500 MG tablet Take by mouth. (Patient not taking: Reported on 12/23/2020)    [DISCONTINUED] acetaminophen-codeine (TYLENOL #3) 300-30 MG tablet Take 1 tablet by mouth every 6 (six) hours as needed for moderate pain. (Patient not taking: Reported on 12/23/2020)    [DISCONTINUED] Alcohol Swabs (B-D SINGLE USE SWABS REGULAR) PADS USE AS DIRECTED TWICE DAILY (Patient not taking: Reported on 12/23/2020)    [DISCONTINUED] bisacodyl (DULCOLAX) 5 MG EC tablet Take 1 tablet (5 mg total) by mouth daily as needed for moderate constipation. (Patient not taking: Reported on 12/23/2020)    [DISCONTINUED] digoxin (LANOXIN) 0.125 MG tablet Take 1 tablet (0.125 mg total) by mouth daily. 02/06/2021: Last filled 12-09-2020 #90   [DISCONTINUED] Docusate Sodium (DSS) 100 MG CAPS Take by mouth. (Patient not taking: Reported on 12/23/2020)    [DISCONTINUED] DROPLET PEN NEEDLES 32G X 4 MM MISC USE AS DIRECTED    [DISCONTINUED] ergocalciferol (VITAMIN D2) 1.25 MG (50000 UT) capsule Take by mouth. (Patient not taking: Reported on 12/23/2020)    [DISCONTINUED] fluticasone (FLONASE) 50 MCG/ACT nasal spray Place 2 sprays into both nostrils daily. (Patient not taking: Reported on 12/23/2020)    [DISCONTINUED] furosemide (LASIX) 20 MG tablet Take 1 tablet (20 mg total) by mouth daily. (Patient not taking: Reported on 12/23/2020)    [DISCONTINUED] glimepiride (AMARYL) 2 MG tablet Take 1 tab before biggest meal of the  day (Patient not taking: Reported on 12/23/2020)    [DISCONTINUED] glucose blood test strip Use as instructed to check blood sugars twice a day.  E11.65 (Patient not taking: Reported on 12/23/2020)    [DISCONTINUED] glucose blood test strip  (Patient not taking: Reported on 12/23/2020)    [DISCONTINUED] insulin glargine, 2 Unit Dial, (TOUJEO MAX SOLOSTAR) 300 UNIT/ML Solostar Pen Inject 14 Units into the skin daily. (Patient taking differently: Inject 10 Units into the skin daily.)    [DISCONTINUED] liraglutide (VICTOZA) 18 MG/3ML SOPN Inject 0.3 mLs (1.8 mg total) into the skin daily. (Patient not taking: Reported on 12/07/2020)    [DISCONTINUED] traMADol (ULTRAM) 50 MG tablet  Take 1 tablet (50 mg total) by mouth 2 (two) times daily as needed. for pain (Patient not taking: Reported on 12/23/2020)    No facility-administered encounter medications on file as of 03/28/2021.    Surgical History: Past Surgical History:  Procedure Laterality Date   ABDOMINAL HYSTERECTOMY     APPENDECTOMY     CATARACT EXTRACTION     CHOLECYSTECTOMY     HERNIA REPAIR     right knee replacement     right nephroectomy      Medical History: Past Medical History:  Diagnosis Date   Anemia    Atrial fibrillation (HCC)    Chronic kidney disease    Congestive heart failure (CHF) (HCC)    Diabetes mellitus without complication (HCC)    GERD (gastroesophageal reflux disease)    Hyperlipidemia    Hypertension    Pneumonia    Pulmonary fibrosis ([place])     Family History: Family History  Problem Relation Age of Onset   Breast cancer Mother     Social History   Socioeconomic History   Marital status: Widowed    Spouse name: Not on file   Number of children: Not on file   Years of education: Not on file   Highest education level: Not on file  Occupational History   Occupation: retired  Tobacco Use   Smoking status: Former    Packs/day: 1.00    Pack years: 0.00    Types: Cigarettes    Quit date: 02/10/2021     Years since quitting: 0.1   Smokeless tobacco: Never  Vaping Use   Vaping Use: Never used  Substance and Sexual Activity   Alcohol use: No   Drug use: No   Sexual activity: Not Currently  Other Topics Concern   Not on file  Social History Narrative   Lives with daughter   Social Determinants of Health   Financial Resource Strain: Not on file  Food Insecurity: Not on file  Transportation Needs: Not on file  Physical Activity: Not on file  Stress: Not on file  Social Connections: Not on file  Intimate Partner Violence: Not on file       Review of Systems  Constitutional: Negative.   HENT: Negative.    Eyes:  Negative for pain.  Respiratory:  Negative for cough, chest tightness, shortness of breath and wheezing.   Cardiovascular:  Negative for chest pain and leg swelling.  Gastrointestinal:  Positive for constipation (takes medication for it. ). Negative for diarrhea, nausea and vomiting.  Genitourinary: Negative.  Negative for dysuria.  Musculoskeletal: Negative.   Neurological: Negative.  Negative for headaches.  Psychiatric/Behavioral: Negative.  Negative for behavioral problems, decreased concentration, dysphoric mood and sleep disturbance. The patient is not nervous/anxious.    Vital Signs: BP (!) 118/54   Pulse (!) 107   Temp (!) 97.4 F (36.3 C)   Resp 16   Ht '4\' 10"'  (1.473 m)   Wt 116 lb 6.4 oz (52.8 kg)   SpO2 98%   BMI 24.33 kg/m    Physical Exam Constitutional:      General: She is not in acute distress.    Appearance: Normal appearance. She is well-developed and normal weight. She is not ill-appearing or diaphoretic.  HENT:     Head: Normocephalic and atraumatic.     Right Ear: Tympanic membrane, ear canal and external ear normal.     Left Ear: Tympanic membrane, ear canal and external ear normal.     Nose:  Nose normal.     Mouth/Throat:     Mouth: Mucous membranes are moist.     Pharynx: Oropharynx is clear. No oropharyngeal exudate.  Eyes:      General: No scleral icterus.       Right eye: No discharge.        Left eye: No discharge.     Extraocular Movements: Extraocular movements intact.     Conjunctiva/sclera: Conjunctivae normal.     Pupils: Pupils are equal, round, and reactive to light.  Neck:     Thyroid: No thyromegaly.     Vascular: No JVD.     Trachea: No tracheal deviation.  Cardiovascular:     Rate and Rhythm: Normal rate and regular rhythm.     Pulses: Normal pulses.     Heart sounds: Normal heart sounds. No murmur heard.   No friction rub. No gallop.  Pulmonary:     Effort: Pulmonary effort is normal. No respiratory distress.     Breath sounds: Normal breath sounds. No stridor. No wheezing or rales.  Chest:     Chest wall: No tenderness.  Breasts:    Right: Normal. No inverted nipple, mass, nipple discharge, skin change, axillary adenopathy or supraclavicular adenopathy.     Left: Normal. No inverted nipple, mass, nipple discharge, skin change, axillary adenopathy or supraclavicular adenopathy.  Abdominal:     General: Bowel sounds are normal. There is no distension.     Palpations: Abdomen is soft. There is no mass.     Tenderness: There is no abdominal tenderness. There is no guarding or rebound.     Hernia: No hernia is present.  Musculoskeletal:        General: No tenderness or deformity. Normal range of motion.     Cervical back: Normal range of motion and neck supple.  Lymphadenopathy:     Cervical: No cervical adenopathy.     Upper Body:     Right upper body: No supraclavicular or axillary adenopathy.     Left upper body: No supraclavicular or axillary adenopathy.  Skin:    General: Skin is warm and dry.     Capillary Refill: Capillary refill takes less than 2 seconds.     Coloration: Skin is not pale.     Findings: No erythema or rash.  Neurological:     Mental Status: She is alert and oriented to person, place, and time.     Motor: No abnormal muscle tone.     Coordination: Coordination  normal.     Deep Tendon Reflexes: Reflexes are normal and symmetric.  Psychiatric:        Mood and Affect: Mood normal.        Behavior: Behavior normal.        Thought Content: Thought content normal.        Judgment: Judgment normal.    Assessment/Plan: 1. Encounter for general adult medical examination with abnormal findings Age-appropriate preventive screenings discussed, annual physical exam completed.   2. Chronic combined systolic and diastolic heart failure ([place]) Recently evaluated in the ED for acute systolic congestive heart failure. Digoxin was discontinued, metoprolol was discontinued. Continue all other medications.   3. Essential hypertension Blood pressure is well-controlled with current medications  4. Uncontrolled type 2 diabetes mellitus with hyperglycemia (HCC) A1C decreased to 6.6 from 6.9. Continue current medications.   5. Left upper quadrant abdominal mass Firm tender tense lump/mass in RUQ of abdomen, will get xray to rule out any acute abnormality.  -  DG Abd 2 Views; Future  6. Mixed hyperlipidemia HDL is low, discussed taking fish oil capsules daily.   7. Primary generalized (osteo)arthritis Stable with medication; tramadol refilled.  - traMADol (ULTRAM) 50 MG tablet; Take 1 tablet (50 mg total) by mouth 2 (two) times daily as needed for up to 5 days.  Dispense: 60 tablet; Refill: 2  8. Dysuria Routine urinalysis done.  - UA/M w/rflx Culture, Routine - Microscopic Examination      General Counseling: Melodee verbalizes understanding of the findings of todays visit and agrees with plan of treatment. I have discussed any further diagnostic evaluation that may be needed or ordered today. We also reviewed her medications today. she has been encouraged to call the office with any questions or concerns that should arise related to todays visit.    Orders Placed This Encounter  Procedures   Microscopic Examination   DG Abd 2 Views   UA/M w/rflx  Culture, Routine    Meds ordered this encounter  Medications   traMADol (ULTRAM) 50 MG tablet    Sig: Take 1 tablet (50 mg total) by mouth 2 (two) times daily as needed for up to 5 days.    Dispense:  60 tablet    Refill:  2    Return in about 6 months (around 09/27/2021) for F/U, med refill, Missie Gehrig PCP.   Total time spent:30 Minutes Time spent includes review of chart, medications, test results, and follow up plan with the patient.   [place] Controlled Substance Database was reviewed by me.  This patient was seen by Jonetta Osgood, FNP-C in collaboration with Dr. Clayborn Bigness as a part of collaborative care agreement.  Balen Woolum R. Valetta Fuller, MSN, FNP-C Internal medicine

## 2021-03-29 ENCOUNTER — Telehealth: Payer: Self-pay | Admitting: Internal Medicine

## 2021-03-29 LAB — UA/M W/RFLX CULTURE, ROUTINE
Bilirubin, UA: NEGATIVE
Glucose, UA: NEGATIVE
Ketones, UA: NEGATIVE
Leukocytes,UA: NEGATIVE
Nitrite, UA: NEGATIVE
Specific Gravity, UA: 1.01 (ref 1.005–1.030)
Urobilinogen, Ur: 0.2 mg/dL (ref 0.2–1.0)
pH, UA: 6.5 (ref 5.0–7.5)

## 2021-03-29 LAB — MICROSCOPIC EXAMINATION
Bacteria, UA: NONE SEEN
Casts: NONE SEEN /lpf

## 2021-03-29 NOTE — Chronic Care Management (AMB) (Signed)
  Chronic Care Management   Outreach Note  03/29/2021 Name: [name] MRN: [id number] DOB: Jun 16, 1941  Referred by: Lavera Guise, MD Reason for referral : No chief complaint on file.   An unsuccessful telephone outreach was attempted today. The patient was referred to the pharmacist for assistance with care management and care coordination.   Follow Up Plan:   [name] Upstream Scheduler

## 2021-03-31 ENCOUNTER — Ambulatory Visit (INDEPENDENT_AMBULATORY_CARE_PROVIDER_SITE_OTHER): Payer: Medicare HMO

## 2021-03-31 DIAGNOSIS — Z7901 Long term (current) use of anticoagulants: Secondary | ICD-10-CM

## 2021-03-31 NOTE — Progress Notes (Signed)
Pt INR 1.3 as per Alysaa pt daughter advised to take extra dose today of coumadin 2 mg and then go back to normal dosing

## 2021-04-01 ENCOUNTER — Telehealth: Payer: Self-pay

## 2021-04-01 NOTE — Telephone Encounter (Signed)
Applicant medical information completed by provider and faxed back to [facility] at [phone]. Copy placed in scan. [name]

## 2021-04-05 LAB — PROTIME-INR

## 2021-04-08 ENCOUNTER — Ambulatory Visit (INDEPENDENT_AMBULATORY_CARE_PROVIDER_SITE_OTHER): Payer: Medicare HMO

## 2021-04-08 DIAGNOSIS — Z7901 Long term (current) use of anticoagulants: Secondary | ICD-10-CM | POA: Diagnosis not present

## 2021-04-08 NOTE — Progress Notes (Signed)
Pt home INR  1.2  as per dr Humphrey Rolls advised pt daughter  take 1 extra dose of coumadin 2mg  today and then go to normal dosing coumadin 2 mg daily

## 2021-04-09 DIAGNOSIS — J449 Chronic obstructive pulmonary disease, unspecified: Secondary | ICD-10-CM | POA: Diagnosis not present

## 2021-04-09 DIAGNOSIS — I480 Paroxysmal atrial fibrillation: Secondary | ICD-10-CM | POA: Diagnosis not present

## 2021-04-09 DIAGNOSIS — I110 Hypertensive heart disease with heart failure: Secondary | ICD-10-CM | POA: Diagnosis not present

## 2021-04-09 DIAGNOSIS — N185 Chronic kidney disease, stage 5: Secondary | ICD-10-CM | POA: Diagnosis not present

## 2021-04-09 DIAGNOSIS — D631 Anemia in chronic kidney disease: Secondary | ICD-10-CM | POA: Diagnosis not present

## 2021-04-09 DIAGNOSIS — I5022 Chronic systolic (congestive) heart failure: Secondary | ICD-10-CM | POA: Diagnosis not present

## 2021-04-09 DIAGNOSIS — I5033 Acute on chronic diastolic (congestive) heart failure: Secondary | ICD-10-CM | POA: Diagnosis not present

## 2021-04-09 DIAGNOSIS — I2510 Atherosclerotic heart disease of native coronary artery without angina pectoris: Secondary | ICD-10-CM | POA: Diagnosis not present

## 2021-04-09 DIAGNOSIS — E1122 Type 2 diabetes mellitus with diabetic chronic kidney disease: Secondary | ICD-10-CM | POA: Diagnosis not present

## 2021-04-12 DIAGNOSIS — I509 Heart failure, unspecified: Secondary | ICD-10-CM | POA: Diagnosis not present

## 2021-04-12 DIAGNOSIS — I5033 Acute on chronic diastolic (congestive) heart failure: Secondary | ICD-10-CM | POA: Diagnosis not present

## 2021-04-16 DIAGNOSIS — J449 Chronic obstructive pulmonary disease, unspecified: Secondary | ICD-10-CM | POA: Diagnosis not present

## 2021-04-16 DIAGNOSIS — I509 Heart failure, unspecified: Secondary | ICD-10-CM | POA: Diagnosis not present

## 2021-04-16 DIAGNOSIS — I2510 Atherosclerotic heart disease of native coronary artery without angina pectoris: Secondary | ICD-10-CM | POA: Diagnosis not present

## 2021-04-16 DIAGNOSIS — I5022 Chronic systolic (congestive) heart failure: Secondary | ICD-10-CM | POA: Diagnosis not present

## 2021-04-16 DIAGNOSIS — N185 Chronic kidney disease, stage 5: Secondary | ICD-10-CM | POA: Diagnosis not present

## 2021-04-16 DIAGNOSIS — D631 Anemia in chronic kidney disease: Secondary | ICD-10-CM | POA: Diagnosis not present

## 2021-04-16 DIAGNOSIS — I5033 Acute on chronic diastolic (congestive) heart failure: Secondary | ICD-10-CM | POA: Diagnosis not present

## 2021-04-16 DIAGNOSIS — I480 Paroxysmal atrial fibrillation: Secondary | ICD-10-CM | POA: Diagnosis not present

## 2021-04-16 DIAGNOSIS — E1122 Type 2 diabetes mellitus with diabetic chronic kidney disease: Secondary | ICD-10-CM | POA: Diagnosis not present

## 2021-04-16 DIAGNOSIS — I110 Hypertensive heart disease with heart failure: Secondary | ICD-10-CM | POA: Diagnosis not present

## 2021-04-18 ENCOUNTER — Telehealth: Payer: Self-pay

## 2021-04-18 ENCOUNTER — Ambulatory Visit: Payer: Medicare HMO | Admitting: Family

## 2021-04-18 ENCOUNTER — Ambulatory Visit (INDEPENDENT_AMBULATORY_CARE_PROVIDER_SITE_OTHER): Payer: Medicare HMO

## 2021-04-18 DIAGNOSIS — Z7901 Long term (current) use of anticoagulants: Secondary | ICD-10-CM | POA: Diagnosis not present

## 2021-04-18 NOTE — Telephone Encounter (Signed)
Left voicemail for patient to call our office back provider is requesting they be seen today. [name]

## 2021-04-18 NOTE — Telephone Encounter (Signed)
Her daughter will bring her @ 9:40 in morning. Patient went to adult center for the day. She stated she did not need to be seen today since she felt better.

## 2021-04-18 NOTE — Progress Notes (Signed)
Pt daughter advised that PT INR 1.3 as per dr Humphrey Rolls advised her take  coumadin 2 mg take 2 tabs(4 mag)every Monday and Thursday and rest of the day take 1 (2mg )tab daily and repeat in 1 week

## 2021-04-18 NOTE — Telephone Encounter (Signed)
She needs to be seen.

## 2021-04-19 ENCOUNTER — Ambulatory Visit (INDEPENDENT_AMBULATORY_CARE_PROVIDER_SITE_OTHER): Payer: Medicare HMO | Admitting: Internal Medicine

## 2021-04-19 ENCOUNTER — Encounter: Payer: Self-pay | Admitting: Internal Medicine

## 2021-04-19 ENCOUNTER — Other Ambulatory Visit: Payer: Self-pay

## 2021-04-19 VITALS — BP 150/44 | HR 59 | Temp 97.2°F | Resp 16 | Ht <= 58 in | Wt 122.0 lb

## 2021-04-19 DIAGNOSIS — N185 Chronic kidney disease, stage 5: Secondary | ICD-10-CM | POA: Diagnosis not present

## 2021-04-19 DIAGNOSIS — I2510 Atherosclerotic heart disease of native coronary artery without angina pectoris: Secondary | ICD-10-CM | POA: Diagnosis not present

## 2021-04-19 DIAGNOSIS — R198 Other specified symptoms and signs involving the digestive system and abdomen: Secondary | ICD-10-CM | POA: Diagnosis not present

## 2021-04-19 DIAGNOSIS — I5033 Acute on chronic diastolic (congestive) heart failure: Secondary | ICD-10-CM | POA: Diagnosis not present

## 2021-04-19 DIAGNOSIS — I110 Hypertensive heart disease with heart failure: Secondary | ICD-10-CM | POA: Diagnosis not present

## 2021-04-19 DIAGNOSIS — D631 Anemia in chronic kidney disease: Secondary | ICD-10-CM | POA: Diagnosis not present

## 2021-04-19 DIAGNOSIS — I480 Paroxysmal atrial fibrillation: Secondary | ICD-10-CM | POA: Diagnosis not present

## 2021-04-19 DIAGNOSIS — I5022 Chronic systolic (congestive) heart failure: Secondary | ICD-10-CM | POA: Diagnosis not present

## 2021-04-19 DIAGNOSIS — E1122 Type 2 diabetes mellitus with diabetic chronic kidney disease: Secondary | ICD-10-CM | POA: Diagnosis not present

## 2021-04-19 DIAGNOSIS — K5901 Slow transit constipation: Secondary | ICD-10-CM

## 2021-04-19 DIAGNOSIS — J449 Chronic obstructive pulmonary disease, unspecified: Secondary | ICD-10-CM | POA: Diagnosis not present

## 2021-04-19 LAB — PROTIME-INR

## 2021-04-19 MED ORDER — LINACLOTIDE 145 MCG PO CAPS: 145.0000 ug | ORAL_CAPSULE | Freq: Every day | ORAL | 1 refills | Status: DC

## 2021-04-19 NOTE — Progress Notes (Signed)
[facility] [place], [place] [address]  Internal MEDICINE  Office Visit Note  Patient Name: [name]  [id number]  [id number]  Date of Service: 04/19/2021  Chief Complaint  Patient presents with   Acute Visit    Knot in stomach below belly button, review results   Quality Metric Gaps    Eye exam, foot exam, shingrix, covid boster    HPI Pt is here with her daughter, follow up on abdominal xray which shows moderate to large colonic stool burden, suggesting constipation. She feels like there is a lump in her belly and its moving, she has been taking Miralax and dulcolax OTC with minimum relief    Current Medication: Outpatient Encounter Medications as of 04/19/2021  Medication Sig Note   acetaminophen (TYLENOL) 325 MG tablet Take 650 mg by mouth every 6 (six) hours as needed.    atorvastatin (LIPITOR) 10 MG tablet Take 1 tablet (10 mg total) by mouth every evening.    benzonatate (TESSALON) 200 MG capsule Take 1 capsule (200 mg total) by mouth 2 (two) times daily as needed for cough.    bisacodyl (DULCOLAX) 5 MG EC tablet Take 1 tablet (5 mg total) by mouth daily as needed for moderate constipation.    Blood Pressure Monitor KIT Use three times daily to check blood pressure  DX:  I50.42    citalopram (CELEXA) 40 MG tablet Take 1 tablet (40 mg total) by mouth daily.    donepezil (ARICEPT) 5 MG tablet TAKE 1 TABLET BY MOUTH ONCE DAILY FOR MEMORY (Patient taking differently: Take 5 mg by mouth daily. TAKE 1 TABLET BY MOUTH ONCE DAILY FOR MEMORY)    ferrous sulfate 325 (65 FE) MG tablet Take 1 tablet (325 mg total) by mouth daily with breakfast.    fluticasone (FLONASE) 50 MCG/ACT nasal spray Place 1 spray into both nostrils daily.    furosemide (LASIX) 40 MG tablet Take 1 tablet (40 mg total) by mouth daily.    linaclotide (LINZESS) 145 MCG CAPS capsule Take 1 capsule (145 mcg total) by mouth daily before breakfast.    melatonin 3 MG TABS tablet Take 3 mg  by mouth at bedtime as needed (sleep).    pantoprazole (PROTONIX) 40 MG tablet Take 1 tablet (40 mg total) by mouth 2 (two) times daily.    polyethylene glycol (MIRALAX) 17 g packet Take 17 g by mouth daily. (Patient taking differently: Take 17 g by mouth daily as needed for mild constipation.)    promethazine (PHENERGAN) 12.5 MG tablet Take 1 tablet (12.5 mg total) by mouth every 8 (eight) hours as needed for nausea or vomiting.    rOPINIRole (REQUIP) 0.5 MG tablet Take 1 tablet (0.5 mg total) by mouth every evening.    vitamin B-12 1000 MCG tablet Take 1 tablet (1,000 mcg total) by mouth daily.    warfarin (COUMADIN) 2 MG tablet Take 1 tablet (2 mg total) by mouth daily.    hydrALAZINE (APRESOLINE) 25 MG tablet Take 25 mg by mouth 3 (three) times daily as needed for high blood pressure. May take an additional tablet if BP is elevated. Hold if SBP less than 130 mmhg 03/15/2021: Patient takes twice daily in the morning and in the evening   traMADol (ULTRAM) 50 MG tablet Take 1 tablet (50 mg total) by mouth 2 (two) times daily as needed for up to 5 days.    [DISCONTINUED] lisinopril (ZESTRIL) 20 MG tablet Take 1 tablet (20 mg total) by  mouth daily. (Patient not taking: Reported on 04/19/2021)    No facility-administered encounter medications on file as of 04/19/2021.    Surgical History: Past Surgical History:  Procedure Laterality Date   ABDOMINAL HYSTERECTOMY     APPENDECTOMY     CATARACT EXTRACTION     CHOLECYSTECTOMY     HERNIA REPAIR     right knee replacement     right nephroectomy      Medical History: Past Medical History:  Diagnosis Date   Anemia    Atrial fibrillation (HCC)    Chronic kidney disease    Congestive heart failure (CHF) (HCC)    Diabetes mellitus without complication (HCC)    GERD (gastroesophageal reflux disease)    Hyperlipidemia    Hypertension    Pneumonia    Pulmonary fibrosis ([place])     Family History: Family History  Problem Relation Age of Onset    Diabetes Mother    Breast cancer Mother    Heart disease Father    Diabetes Father    Diabetes Sister    Diabetes Brother    Cancer Brother     Social History   Socioeconomic History   Marital status: Widowed    Spouse name: Not on file   Number of children: Not on file   Years of education: Not on file   Highest education level: Not on file  Occupational History   Occupation: retired  Tobacco Use   Smoking status: Former    Packs/day: 1.00    Pack years: 0.00    Types: Cigarettes    Quit date: 02/10/2021    Years since quitting: 0.1   Smokeless tobacco: Never  Vaping Use   Vaping Use: Never used  Substance and Sexual Activity   Alcohol use: No   Drug use: No   Sexual activity: Not Currently  Other Topics Concern   Not on file  Social History Narrative   Lives with daughter   Social Determinants of Health   Financial Resource Strain: Not on file  Food Insecurity: Not on file  Transportation Needs: Not on file  Physical Activity: Not on file  Stress: Not on file  Social Connections: Not on file  Intimate Partner Violence: Not on file      Review of Systems  Constitutional:  Negative for fatigue and fever.  HENT:  Negative for congestion, mouth sores and postnasal drip.   Respiratory:  Negative for cough.   Cardiovascular:  Negative for chest pain.  Gastrointestinal:  Positive for abdominal distention and constipation.  Genitourinary:  Negative for flank pain.  Psychiatric/Behavioral: Negative.     Vital Signs: BP (!) 150/44   Pulse (!) 59   Temp (!) 97.2 F (36.2 C)   Resp 16   Ht _0  (1.473 m)   Wt 122 lb (55.3 kg)   SpO2 98%   BMI 25.50 kg/m    Physical Exam Constitutional:      Appearance: Normal appearance.  HENT:     Head: Normocephalic and atraumatic.     Nose: Nose normal.     Mouth/Throat:     Mouth: Mucous membranes are moist.     Pharynx: No posterior oropharyngeal erythema.  Eyes:     Extraocular Movements: Extraocular  movements intact.     Pupils: Pupils are equal, round, and reactive to light.  Cardiovascular:     Pulses: Normal pulses.     Heart sounds: Normal heart sounds.  Pulmonary:     Effort: Pulmonary  effort is normal.     Breath sounds: Normal breath sounds.  Abdominal:     General: There is distension.     Comments: Firm area in mid abdomen, non tender   Neurological:     General: No focal deficit present.     Mental Status: She is alert.  Psychiatric:        Mood and Affect: Mood normal.        Behavior: Behavior normal.       Assessment/Plan: 1. Constipation by delayed colonic transit Samples of Linzess 145 mg po qd is given ( 3 boxes) continue one teaspoon of Miralax qd as well   2. Abdomen enlarged Lump seems to be stool mass, will monitor, if needed will get CT abdomen    General Counseling: Shantil verbalizes understanding of the findings of todays visit and agrees with plan of treatment. I have discussed any further diagnostic evaluation that may be needed or ordered today. We also reviewed her medications today. she has been encouraged to call the office with any questions or concerns that should arise related to todays visit.    No orders of the defined types were placed in this encounter.   Meds ordered this encounter  Medications   linaclotide (LINZESS) 145 MCG CAPS capsule    Sig: Take 1 capsule (145 mcg total) by mouth daily before breakfast.    Dispense:  90 capsule    Refill:  1   Total time spent:30 Minutes Time spent includes review of chart, medications, test results, and follow up plan with the patient.   [place] Controlled Substance Database was reviewed by me.   Dr Lavera Guise Internal medicine

## 2021-04-21 ENCOUNTER — Telehealth: Payer: Self-pay | Admitting: Internal Medicine

## 2021-04-21 DIAGNOSIS — E1122 Type 2 diabetes mellitus with diabetic chronic kidney disease: Secondary | ICD-10-CM | POA: Diagnosis not present

## 2021-04-21 DIAGNOSIS — E611 Iron deficiency: Secondary | ICD-10-CM | POA: Diagnosis not present

## 2021-04-21 DIAGNOSIS — R319 Hematuria, unspecified: Secondary | ICD-10-CM | POA: Diagnosis not present

## 2021-04-21 DIAGNOSIS — D529 Folate deficiency anemia, unspecified: Secondary | ICD-10-CM | POA: Diagnosis not present

## 2021-04-21 DIAGNOSIS — I509 Heart failure, unspecified: Secondary | ICD-10-CM | POA: Diagnosis not present

## 2021-04-21 DIAGNOSIS — D631 Anemia in chronic kidney disease: Secondary | ICD-10-CM | POA: Diagnosis not present

## 2021-04-21 DIAGNOSIS — E875 Hyperkalemia: Secondary | ICD-10-CM | POA: Diagnosis not present

## 2021-04-21 DIAGNOSIS — N184 Chronic kidney disease, stage 4 (severe): Secondary | ICD-10-CM | POA: Diagnosis not present

## 2021-04-21 DIAGNOSIS — I129 Hypertensive chronic kidney disease with stage 1 through stage 4 chronic kidney disease, or unspecified chronic kidney disease: Secondary | ICD-10-CM | POA: Diagnosis not present

## 2021-04-21 NOTE — Chronic Care Management (AMB) (Signed)
  Chronic Care Management   Outreach Note  04/21/2021 Name: ANJEANETTE PETZOLD MRN: [id number] DOB: [birth year]  Referred by: Lavera Guise, MD Reason for referral : No chief complaint on file.   A second unsuccessful telephone outreach was attempted today. The patient was referred to pharmacist for assistance with care management and care coordination.  Follow Up Plan:   [name] Upstream Scheduler

## 2021-04-27 ENCOUNTER — Telehealth: Payer: Self-pay

## 2021-04-27 ENCOUNTER — Ambulatory Visit (INDEPENDENT_AMBULATORY_CARE_PROVIDER_SITE_OTHER): Payer: Medicare HMO

## 2021-04-27 DIAGNOSIS — Z7901 Long term (current) use of anticoagulants: Secondary | ICD-10-CM | POA: Diagnosis not present

## 2021-04-27 NOTE — Progress Notes (Signed)
Pt INR 1.2 spoke with daughter at 4:38 that she can take 1 extra pill for coumadin today and go back to normal dosing

## 2021-04-27 NOTE — Telephone Encounter (Signed)
[name] to call us back regarding INR  

## 2021-04-29 ENCOUNTER — Telehealth: Payer: Self-pay

## 2021-04-29 NOTE — Telephone Encounter (Signed)
Called patient to review results no answer.LNB

## 2021-05-03 ENCOUNTER — Telehealth: Payer: Self-pay | Admitting: Internal Medicine

## 2021-05-03 NOTE — Chronic Care Management (AMB) (Signed)
  Chronic Care Management   Outreach Note  05/03/2021 Name: [name] MRN: [id number] DOB: 1941-03-26  Referred by: Lavera Guise, MD Reason for referral : No chief complaint on file.   A second unsuccessful telephone outreach was attempted today. The patient was referred to pharmacist for assistance with care management and care coordination.  Follow Up Plan:   [name] Upstream Scheduler

## 2021-05-04 ENCOUNTER — Ambulatory Visit (INDEPENDENT_AMBULATORY_CARE_PROVIDER_SITE_OTHER): Payer: Medicare HMO

## 2021-05-04 ENCOUNTER — Telehealth: Payer: Self-pay

## 2021-05-04 DIAGNOSIS — Z7901 Long term (current) use of anticoagulants: Secondary | ICD-10-CM

## 2021-05-04 MED ORDER — WARFARIN SODIUM 2 MG PO TABS: 2.0000 mg | ORAL_TABLET | Freq: Every day | ORAL | 3 refills | Status: DC

## 2021-05-04 NOTE — Telephone Encounter (Signed)
Called pt back on home and cell phone and LVM with instruction on new dosing of her coumadin 4 mg on wed and sat and 2 mg on all other days

## 2021-05-04 NOTE — Progress Notes (Signed)
Pt's INR today was 1.3.  Per [facility] pt is to take two 2mg  tab(total 4 mg) on wed and sat, and all other days take one tablet(total 2 mg)  will fup in 1 week.  I left message for daughter to call back on home phone and mobile phone along with directions for medication.  Asked for her to call me back to make sure she has the directions.

## 2021-05-06 LAB — PROTIME-INR

## 2021-05-07 ENCOUNTER — Other Ambulatory Visit: Payer: Self-pay | Admitting: Internal Medicine

## 2021-05-07 DIAGNOSIS — F339 Major depressive disorder, recurrent, unspecified: Secondary | ICD-10-CM

## 2021-05-07 DIAGNOSIS — I2510 Atherosclerotic heart disease of native coronary artery without angina pectoris: Secondary | ICD-10-CM | POA: Diagnosis not present

## 2021-05-07 DIAGNOSIS — N185 Chronic kidney disease, stage 5: Secondary | ICD-10-CM | POA: Diagnosis not present

## 2021-05-07 DIAGNOSIS — I110 Hypertensive heart disease with heart failure: Secondary | ICD-10-CM | POA: Diagnosis not present

## 2021-05-07 DIAGNOSIS — D631 Anemia in chronic kidney disease: Secondary | ICD-10-CM | POA: Diagnosis not present

## 2021-05-07 DIAGNOSIS — I5033 Acute on chronic diastolic (congestive) heart failure: Secondary | ICD-10-CM | POA: Diagnosis not present

## 2021-05-07 DIAGNOSIS — J449 Chronic obstructive pulmonary disease, unspecified: Secondary | ICD-10-CM | POA: Diagnosis not present

## 2021-05-07 DIAGNOSIS — E1122 Type 2 diabetes mellitus with diabetic chronic kidney disease: Secondary | ICD-10-CM | POA: Diagnosis not present

## 2021-05-07 DIAGNOSIS — I480 Paroxysmal atrial fibrillation: Secondary | ICD-10-CM | POA: Diagnosis not present

## 2021-05-07 DIAGNOSIS — G2581 Restless legs syndrome: Secondary | ICD-10-CM

## 2021-05-07 DIAGNOSIS — I5022 Chronic systolic (congestive) heart failure: Secondary | ICD-10-CM | POA: Diagnosis not present

## 2021-05-12 DIAGNOSIS — I509 Heart failure, unspecified: Secondary | ICD-10-CM | POA: Diagnosis not present

## 2021-05-12 DIAGNOSIS — I5033 Acute on chronic diastolic (congestive) heart failure: Secondary | ICD-10-CM | POA: Diagnosis not present

## 2021-05-13 ENCOUNTER — Ambulatory Visit (INDEPENDENT_AMBULATORY_CARE_PROVIDER_SITE_OTHER): Payer: Medicare HMO

## 2021-05-13 DIAGNOSIS — I4821 Permanent atrial fibrillation: Secondary | ICD-10-CM

## 2021-05-13 NOTE — Progress Notes (Signed)
Pt's INR was 1.2 today and per Lauren pt will take two 2 mg tablets (4 mg total) on Monday, Wednesday and Saturday and all other days thake one 2 mg tablet.  Called and spoke to daughter June and informed her of the changes.

## 2021-05-14 DIAGNOSIS — I5022 Chronic systolic (congestive) heart failure: Secondary | ICD-10-CM | POA: Diagnosis not present

## 2021-05-14 DIAGNOSIS — I5033 Acute on chronic diastolic (congestive) heart failure: Secondary | ICD-10-CM | POA: Diagnosis not present

## 2021-05-14 DIAGNOSIS — I2510 Atherosclerotic heart disease of native coronary artery without angina pectoris: Secondary | ICD-10-CM | POA: Diagnosis not present

## 2021-05-14 DIAGNOSIS — D631 Anemia in chronic kidney disease: Secondary | ICD-10-CM | POA: Diagnosis not present

## 2021-05-14 DIAGNOSIS — E1122 Type 2 diabetes mellitus with diabetic chronic kidney disease: Secondary | ICD-10-CM | POA: Diagnosis not present

## 2021-05-14 DIAGNOSIS — J449 Chronic obstructive pulmonary disease, unspecified: Secondary | ICD-10-CM | POA: Diagnosis not present

## 2021-05-14 DIAGNOSIS — N185 Chronic kidney disease, stage 5: Secondary | ICD-10-CM | POA: Diagnosis not present

## 2021-05-14 DIAGNOSIS — I480 Paroxysmal atrial fibrillation: Secondary | ICD-10-CM | POA: Diagnosis not present

## 2021-05-14 DIAGNOSIS — I110 Hypertensive heart disease with heart failure: Secondary | ICD-10-CM | POA: Diagnosis not present

## 2021-05-16 DIAGNOSIS — I5033 Acute on chronic diastolic (congestive) heart failure: Secondary | ICD-10-CM | POA: Diagnosis not present

## 2021-05-16 DIAGNOSIS — I509 Heart failure, unspecified: Secondary | ICD-10-CM | POA: Diagnosis not present

## 2021-05-17 ENCOUNTER — Other Ambulatory Visit: Payer: Self-pay

## 2021-05-17 ENCOUNTER — Telehealth: Payer: Self-pay

## 2021-05-17 MED ORDER — PREDNISONE 10 MG PO TABS: ORAL_TABLET | ORAL | 0 refills | Status: DC

## 2021-05-17 MED ORDER — AZITHROMYCIN 250 MG PO TABS: ORAL_TABLET | ORAL | 0 refills | Status: DC

## 2021-05-17 NOTE — Telephone Encounter (Signed)
Pt's daughter called c/o cough (nagging) for 2 days now, no fever, when she breathing in it hurts under breast area and looked swollen and irritating when breathing.  Stuffy/runny nose.  Per [facility] sent in zpak 10 tabs and prednisone 18 tabs for COPD.  Pt informed.

## 2021-05-17 NOTE — Telephone Encounter (Signed)
Pharmacy called Zithromax is interact with coumadin and digoxin as per dr Humphrey Rolls advised that we can coumadin 2 days and also pt no longer on digoxin and pt advised that check her coumadin in 2 days and advised her let phar know that she is not on digoxin and we will call [facility] for more strips

## 2021-05-20 LAB — PROTIME-INR

## 2021-05-23 ENCOUNTER — Other Ambulatory Visit: Payer: Self-pay | Admitting: Nurse Practitioner

## 2021-05-23 ENCOUNTER — Ambulatory Visit (INDEPENDENT_AMBULATORY_CARE_PROVIDER_SITE_OTHER): Payer: Medicare HMO

## 2021-05-23 DIAGNOSIS — Z7901 Long term (current) use of anticoagulants: Secondary | ICD-10-CM

## 2021-05-23 DIAGNOSIS — M150 Primary generalized (osteo)arthritis: Secondary | ICD-10-CM

## 2021-05-23 NOTE — Progress Notes (Signed)
PT INR 2.2 continue same med as prescribed

## 2021-05-24 ENCOUNTER — Encounter: Payer: Self-pay | Admitting: *Deleted

## 2021-05-24 ENCOUNTER — Inpatient Hospital Stay
Admission: EM | Admit: 2021-05-24 | Discharge: 2021-05-30 | DRG: 291 | Disposition: A | Discharge status: Home Health | Payer: Medicare HMO | Attending: Internal Medicine | Admitting: Internal Medicine

## 2021-05-24 ENCOUNTER — Inpatient Hospital Stay: Payer: Medicare HMO

## 2021-05-24 ENCOUNTER — Other Ambulatory Visit: Payer: Self-pay

## 2021-05-24 ENCOUNTER — Emergency Department: Payer: Medicare HMO

## 2021-05-24 DIAGNOSIS — R079 Chest pain, unspecified: Secondary | ICD-10-CM | POA: Diagnosis not present

## 2021-05-24 DIAGNOSIS — E1122 Type 2 diabetes mellitus with diabetic chronic kidney disease: Secondary | ICD-10-CM | POA: Diagnosis not present

## 2021-05-24 DIAGNOSIS — D631 Anemia in chronic kidney disease: Secondary | ICD-10-CM | POA: Diagnosis not present

## 2021-05-24 DIAGNOSIS — Z66 Do not resuscitate: Secondary | ICD-10-CM | POA: Diagnosis not present

## 2021-05-24 DIAGNOSIS — Z833 Family history of diabetes mellitus: Secondary | ICD-10-CM

## 2021-05-24 DIAGNOSIS — Z7901 Long term (current) use of anticoagulants: Secondary | ICD-10-CM | POA: Diagnosis not present

## 2021-05-24 DIAGNOSIS — Z6825 Body mass index (BMI) 25.0-25.9, adult: Secondary | ICD-10-CM | POA: Diagnosis not present

## 2021-05-24 DIAGNOSIS — I351 Nonrheumatic aortic (valve) insufficiency: Secondary | ICD-10-CM | POA: Diagnosis not present

## 2021-05-24 DIAGNOSIS — K219 Gastro-esophageal reflux disease without esophagitis: Secondary | ICD-10-CM | POA: Diagnosis not present

## 2021-05-24 DIAGNOSIS — Z20822 Contact with and (suspected) exposure to covid-19: Secondary | ICD-10-CM | POA: Diagnosis not present

## 2021-05-24 DIAGNOSIS — I10 Essential (primary) hypertension: Secondary | ICD-10-CM | POA: Diagnosis not present

## 2021-05-24 DIAGNOSIS — Z8701 Personal history of pneumonia (recurrent): Secondary | ICD-10-CM

## 2021-05-24 DIAGNOSIS — I509 Heart failure, unspecified: Secondary | ICD-10-CM | POA: Diagnosis not present

## 2021-05-24 DIAGNOSIS — I130 Hypertensive heart and chronic kidney disease with heart failure and stage 1 through stage 4 chronic kidney disease, or unspecified chronic kidney disease: Principal | ICD-10-CM | POA: Diagnosis present

## 2021-05-24 DIAGNOSIS — T502X5A Adverse effect of carbonic-anhydrase inhibitors, benzothiadiazides and other diuretics, initial encounter: Secondary | ICD-10-CM | POA: Diagnosis present

## 2021-05-24 DIAGNOSIS — Z79890 Hormone replacement therapy (postmenopausal): Secondary | ICD-10-CM | POA: Diagnosis not present

## 2021-05-24 DIAGNOSIS — Z87891 Personal history of nicotine dependence: Secondary | ICD-10-CM | POA: Diagnosis not present

## 2021-05-24 DIAGNOSIS — E440 Moderate protein-calorie malnutrition: Secondary | ICD-10-CM | POA: Diagnosis not present

## 2021-05-24 DIAGNOSIS — Z9049 Acquired absence of other specified parts of digestive tract: Secondary | ICD-10-CM | POA: Diagnosis not present

## 2021-05-24 DIAGNOSIS — N184 Chronic kidney disease, stage 4 (severe): Secondary | ICD-10-CM | POA: Diagnosis not present

## 2021-05-24 DIAGNOSIS — I480 Paroxysmal atrial fibrillation: Secondary | ICD-10-CM | POA: Diagnosis not present

## 2021-05-24 DIAGNOSIS — I110 Hypertensive heart disease with heart failure: Secondary | ICD-10-CM | POA: Diagnosis not present

## 2021-05-24 DIAGNOSIS — J9600 Acute respiratory failure, unspecified whether with hypoxia or hypercapnia: Secondary | ICD-10-CM | POA: Diagnosis not present

## 2021-05-24 DIAGNOSIS — I4891 Unspecified atrial fibrillation: Secondary | ICD-10-CM | POA: Diagnosis not present

## 2021-05-24 DIAGNOSIS — Z79899 Other long term (current) drug therapy: Secondary | ICD-10-CM | POA: Diagnosis not present

## 2021-05-24 DIAGNOSIS — J9611 Chronic respiratory failure with hypoxia: Secondary | ICD-10-CM | POA: Diagnosis not present

## 2021-05-24 DIAGNOSIS — Z90710 Acquired absence of both cervix and uterus: Secondary | ICD-10-CM

## 2021-05-24 DIAGNOSIS — J8410 Pulmonary fibrosis, unspecified: Secondary | ICD-10-CM | POA: Diagnosis present

## 2021-05-24 DIAGNOSIS — Z905 Acquired absence of kidney: Secondary | ICD-10-CM

## 2021-05-24 DIAGNOSIS — R600 Localized edema: Secondary | ICD-10-CM | POA: Diagnosis not present

## 2021-05-24 DIAGNOSIS — F0390 Unspecified dementia without behavioral disturbance: Secondary | ICD-10-CM | POA: Diagnosis present

## 2021-05-24 DIAGNOSIS — R809 Proteinuria, unspecified: Secondary | ICD-10-CM | POA: Diagnosis not present

## 2021-05-24 DIAGNOSIS — R059 Cough, unspecified: Secondary | ICD-10-CM | POA: Diagnosis not present

## 2021-05-24 DIAGNOSIS — I129 Hypertensive chronic kidney disease with stage 1 through stage 4 chronic kidney disease, or unspecified chronic kidney disease: Secondary | ICD-10-CM | POA: Diagnosis not present

## 2021-05-24 DIAGNOSIS — Z8249 Family history of ischemic heart disease and other diseases of the circulatory system: Secondary | ICD-10-CM

## 2021-05-24 DIAGNOSIS — E785 Hyperlipidemia, unspecified: Secondary | ICD-10-CM | POA: Diagnosis not present

## 2021-05-24 DIAGNOSIS — H548 Legal blindness, as defined in USA: Secondary | ICD-10-CM | POA: Diagnosis present

## 2021-05-24 DIAGNOSIS — I350 Nonrheumatic aortic (valve) stenosis: Secondary | ICD-10-CM | POA: Diagnosis not present

## 2021-05-24 DIAGNOSIS — I700 Atherosclerosis of aorta: Secondary | ICD-10-CM | POA: Diagnosis not present

## 2021-05-24 DIAGNOSIS — I517 Cardiomegaly: Secondary | ICD-10-CM | POA: Diagnosis not present

## 2021-05-24 DIAGNOSIS — F32A Depression, unspecified: Secondary | ICD-10-CM | POA: Diagnosis present

## 2021-05-24 DIAGNOSIS — I5033 Acute on chronic diastolic (congestive) heart failure: Secondary | ICD-10-CM | POA: Diagnosis present

## 2021-05-24 DIAGNOSIS — R109 Unspecified abdominal pain: Secondary | ICD-10-CM | POA: Diagnosis not present

## 2021-05-24 DIAGNOSIS — E1129 Type 2 diabetes mellitus with other diabetic kidney complication: Secondary | ICD-10-CM | POA: Diagnosis present

## 2021-05-24 LAB — BASIC METABOLIC PANEL
Anion gap: 7 (ref 5–15)
BUN: 47 mg/dL — ABNORMAL HIGH (ref 8–23)
CO2: 25 mmol/L (ref 22–32)
Calcium: 8.7 mg/dL — ABNORMAL LOW (ref 8.9–10.3)
Chloride: 104 mmol/L (ref 98–111)
Creatinine, Ser: 1.94 mg/dL — ABNORMAL HIGH (ref 0.44–1.00)
GFR, Estimated: 26 mL/min — ABNORMAL LOW (ref >60)
Glucose, Bld: 196 mg/dL — ABNORMAL HIGH (ref 70–99)
Potassium: 3.8 mmol/L (ref 3.5–5.1)
Sodium: 136 mmol/L (ref 135–145)

## 2021-05-24 LAB — HEMOGLOBIN A1C
Hgb A1c MFr Bld: 6.6 % — ABNORMAL HIGH (ref 4.8–5.6)
Mean Plasma Glucose: 142.72 mg/dL

## 2021-05-24 LAB — RESP PANEL BY RT-PCR (FLU A&B, COVID) ARPGX2
Influenza A by PCR: NEGATIVE
Influenza B by PCR: NEGATIVE
SARS Coronavirus 2 by RT PCR: NEGATIVE

## 2021-05-24 LAB — CBC
HCT: 29.3 % — ABNORMAL LOW (ref 36.0–46.0)
Hemoglobin: 9.7 g/dL — ABNORMAL LOW (ref 12.0–15.0)
MCH: 30.8 pg (ref 26.0–34.0)
MCHC: 33.1 g/dL (ref 30.0–36.0)
MCV: 93 fL (ref 80.0–100.0)
Platelets: 245 10*3/uL (ref 150–400)
RBC: 3.15 MIL/uL — ABNORMAL LOW (ref 3.87–5.11)
RDW: 14.3 % (ref 11.5–15.5)
WBC: 8.2 10*3/uL (ref 4.0–10.5)
nRBC: 0 % (ref 0.0–0.2)

## 2021-05-24 LAB — LIPASE, BLOOD: Lipase: 32 U/L (ref 11–51)

## 2021-05-24 LAB — BRAIN NATRIURETIC PEPTIDE: B Natriuretic Peptide: 1334.6 pg/mL — ABNORMAL HIGH (ref 0.0–100.0)

## 2021-05-24 LAB — TROPONIN I (HIGH SENSITIVITY)
Troponin I (High Sensitivity): 15 ng/L (ref <18)
Troponin I (High Sensitivity): 16 ng/L (ref <18)

## 2021-05-24 LAB — PROTIME-INR
INR: 2.3 — ABNORMAL HIGH (ref 0.8–1.2)
Prothrombin Time: 25.3 seconds — ABNORMAL HIGH (ref 11.4–15.2)

## 2021-05-24 MED ORDER — IOHEXOL 9 MG/ML PO SOLN
500.0000 mL | ORAL | Status: AC
  Administered 2021-05-24 (×2): 500 mL via ORAL
  Filled 2021-05-24 (×2): qty 500

## 2021-05-24 MED ORDER — HYDRALAZINE HCL 25 MG PO TABS
25.0000 mg | ORAL_TABLET | Freq: Three times a day (TID) | ORAL | Status: DC
  Administered 2021-05-24 – 2021-05-25 (×4): 25 mg via ORAL
  Filled 2021-05-24 (×4): qty 1

## 2021-05-24 MED ORDER — VITAMIN B-12 1000 MCG PO TABS
1000.0000 ug | ORAL_TABLET | Freq: Every day | ORAL | Status: DC
  Administered 2021-05-25 – 2021-05-30 (×6): 1000 ug via ORAL
  Filled 2021-05-24 (×6): qty 1

## 2021-05-24 MED ORDER — TRAMADOL HCL 50 MG PO TABS: 50.0000 mg | ORAL_TABLET | Freq: Four times a day (QID) | ORAL | Status: DC | PRN

## 2021-05-24 MED ORDER — PANTOPRAZOLE SODIUM 40 MG PO TBEC
40.0000 mg | DELAYED_RELEASE_TABLET | Freq: Two times a day (BID) | ORAL | Status: DC
  Administered 2021-05-24 – 2021-05-30 (×12): 40 mg via ORAL
  Filled 2021-05-24 (×12): qty 1

## 2021-05-24 MED ORDER — MELATONIN 5 MG PO TABS: 5.0000 mg | ORAL_TABLET | Freq: Every evening | ORAL | Status: DC | PRN

## 2021-05-24 MED ORDER — NITROGLYCERIN 0.4 MG SL SUBL: 0.4000 mg | SUBLINGUAL_TABLET | SUBLINGUAL | Status: DC | PRN

## 2021-05-24 MED ORDER — ATORVASTATIN CALCIUM 10 MG PO TABS
10.0000 mg | ORAL_TABLET | Freq: Every evening | ORAL | Status: DC
  Administered 2021-05-24 – 2021-05-29 (×6): 10 mg via ORAL
  Filled 2021-05-24 (×6): qty 1

## 2021-05-24 MED ORDER — FUROSEMIDE 10 MG/ML IJ SOLN: 80.0000 mg | Freq: Once | INTRAMUSCULAR | Status: DC

## 2021-05-24 MED ORDER — FUROSEMIDE 10 MG/ML IJ SOLN
40.0000 mg | Freq: Once | INTRAMUSCULAR | Status: AC
  Administered 2021-05-24: 40 mg via INTRAVENOUS
  Filled 2021-05-24: qty 4

## 2021-05-24 MED ORDER — DONEPEZIL HCL 5 MG PO TABS
5.0000 mg | ORAL_TABLET | Freq: Every day | ORAL | Status: DC
  Administered 2021-05-25 – 2021-05-30 (×6): 5 mg via ORAL
  Filled 2021-05-24 (×6): qty 1

## 2021-05-24 MED ORDER — LINACLOTIDE 145 MCG PO CAPS
145.0000 ug | ORAL_CAPSULE | Freq: Every day | ORAL | Status: DC
  Administered 2021-05-25 – 2021-05-30 (×6): 145 ug via ORAL
  Filled 2021-05-24 (×6): qty 1

## 2021-05-24 MED ORDER — WARFARIN SODIUM 2 MG PO TABS
2.0000 mg | ORAL_TABLET | ORAL | Status: DC
  Administered 2021-05-24: 2 mg via ORAL
  Filled 2021-05-24: qty 1

## 2021-05-24 MED ORDER — ACETAMINOPHEN 160 MG/5ML PO SOLN
650.0000 mg | Freq: Four times a day (QID) | ORAL | Status: DC | PRN
  Filled 2021-05-24: qty 20.3

## 2021-05-24 MED ORDER — WARFARIN SODIUM 4 MG PO TABS
4.0000 mg | ORAL_TABLET | ORAL | Status: DC
  Administered 2021-05-25: 4 mg via ORAL
  Filled 2021-05-24: qty 1

## 2021-05-24 MED ORDER — HYDRALAZINE HCL 20 MG/ML IJ SOLN
5.0000 mg | INTRAMUSCULAR | Status: DC | PRN
  Administered 2021-05-24 – 2021-05-25 (×2): 5 mg via INTRAVENOUS
  Filled 2021-05-24 (×2): qty 1

## 2021-05-24 MED ORDER — FERROUS SULFATE 325 (65 FE) MG PO TABS
325.0000 mg | ORAL_TABLET | Freq: Every day | ORAL | Status: DC
  Administered 2021-05-25 – 2021-05-30 (×6): 325 mg via ORAL
  Filled 2021-05-24 (×6): qty 1

## 2021-05-24 MED ORDER — ACETAMINOPHEN 500 MG PO TABS
1000.0000 mg | ORAL_TABLET | Freq: Once | ORAL | Status: AC
  Administered 2021-05-24: 1000 mg via ORAL
  Filled 2021-05-24: qty 2

## 2021-05-24 MED ORDER — ONDANSETRON HCL 4 MG/2ML IJ SOLN: 4.0000 mg | Freq: Three times a day (TID) | INTRAMUSCULAR | Status: DC | PRN

## 2021-05-24 MED ORDER — CITALOPRAM HYDROBROMIDE 20 MG PO TABS
40.0000 mg | ORAL_TABLET | Freq: Every day | ORAL | Status: DC
  Administered 2021-05-25 – 2021-05-30 (×6): 40 mg via ORAL
  Filled 2021-05-24 (×6): qty 2

## 2021-05-24 MED ORDER — WARFARIN SODIUM 4 MG PO TABS: 4.0000 mg | ORAL_TABLET | ORAL | Status: DC

## 2021-05-24 MED ORDER — DM-GUAIFENESIN ER 30-600 MG PO TB12: 1.0000 | ORAL_TABLET | Freq: Two times a day (BID) | ORAL | Status: DC | PRN

## 2021-05-24 MED ORDER — ROPINIROLE HCL 1 MG PO TABS
0.5000 mg | ORAL_TABLET | Freq: Every evening | ORAL | Status: DC
  Administered 2021-05-25 – 2021-05-29 (×5): 0.5 mg via ORAL
  Filled 2021-05-24 (×2): qty 1
  Filled 2021-05-24: qty 2
  Filled 2021-05-24 (×3): qty 1

## 2021-05-24 MED ORDER — FUROSEMIDE 10 MG/ML IJ SOLN
80.0000 mg | Freq: Two times a day (BID) | INTRAMUSCULAR | Status: DC
  Administered 2021-05-25: 80 mg via INTRAVENOUS
  Filled 2021-05-24: qty 8

## 2021-05-24 MED ORDER — ASPIRIN EC 81 MG PO TBEC
81.0000 mg | DELAYED_RELEASE_TABLET | Freq: Every day | ORAL | Status: DC
Start: 2021-05-24 — End: 2021-05-26
  Administered 2021-05-24 – 2021-05-26 (×3): 81 mg via ORAL
  Filled 2021-05-24 (×3): qty 1

## 2021-05-24 MED ORDER — ALBUTEROL SULFATE (2.5 MG/3ML) 0.083% IN NEBU: 3.0000 mL | INHALATION_SOLUTION | RESPIRATORY_TRACT | Status: DC | PRN

## 2021-05-24 MED ORDER — WARFARIN SODIUM 2 MG PO TABS
2.0000 mg | ORAL_TABLET | ORAL | Status: DC
  Filled 2021-05-24: qty 1

## 2021-05-24 NOTE — Progress Notes (Signed)
[facility]  ROUNDING NOTE   Subjective:   [name]  is a 80 y.o. female with past medical history of anemia, Atrial Fib, CHD, CHF diabetes, and hypertension. Patient reports having trouble breathing and having excess fluid. Patient will be admitted for Acute on chronic diastolic congestive heart failure ([place]) [I50.33]  Patient is known to our practice and currently sees Dr Juleen China. She is resting in bed. Currently short of breath, but just moved from one bed to the other. Recovered quickly. Denies pain or discomfort. Denies loss of appetite, nausea and vomiting. Denies recent pain medications. States she has continued to take all prescribed medications.   Objective:  Vital signs in last 24 hours:  Temp:  [97.7 F (36.5 C)] 97.7 F (36.5 C) (08/02 0451) Pulse Rate:  [50-63] 54 (08/02 0843) Resp:  [11-28] 17 (08/02 0843) BP: (172-205)/(33-58) 195/43 (08/02 1128) SpO2:  [95 %-99 %] 95 % (08/02 0843)  Weight change:  There were no vitals filed for this visit.  Intake/Output: No intake/output data recorded.   Intake/Output this shift:  Total I/O In: -  Out: 1800 [Urine:1800]  Physical Exam: General: NAD, resting in bed  Head: Normocephalic, atraumatic. Moist oral mucosal membranes  Eyes: Anicteric  Lungs:  Clear to auscultation, normal effort  Heart: Regular rate and rhythm  Abdomen:  Soft, nontender  Extremities:  no peripheral edema.  Neurologic: Nonfocal, moving all four extremities  Skin: No lesions       Basic Metabolic Panel: Recent Labs  Lab 05/24/21 0455  NA 136  K 3.8  CL 104  CO2 25  GLUCOSE 196*  BUN 47*  CREATININE 1.94*  CALCIUM 8.7*    Liver Function Tests: No results for input(s): AST, ALT, ALKPHOS, BILITOT, PROT, ALBUMIN in the last 168 hours. Recent Labs  Lab 05/24/21 0742  LIPASE 32   No results for input(s): AMMONIA in the last 168 hours.  CBC: Recent Labs  Lab 05/24/21 0455  WBC 8.2  HGB 9.7*  HCT 29.3*  MCV  93.0  PLT 245    Cardiac Enzymes: No results for input(s): CKTOTAL, CKMB, CKMBINDEX, TROPONINI in the last 168 hours.  BNP: Invalid input(s): POCBNP  CBG: No results for input(s): GLUCAP in the last 168 hours.  Microbiology: Results for orders placed or performed during the hospital encounter of 05/24/21  Resp Panel by RT-PCR (Flu A&B, Covid) Nasopharyngeal Swab     Status: None   Collection Time: 05/24/21  5:44 AM   Specimen: Nasopharyngeal Swab; Nasopharyngeal(NP) swabs in vial transport medium  Result Value Ref Range Status   SARS Coronavirus 2 by RT PCR NEGATIVE NEGATIVE Final    Comment: (NOTE) SARS-CoV-2 target nucleic acids are NOT DETECTED.  The SARS-CoV-2 RNA is generally detectable in upper respiratory specimens during the acute phase of infection. The lowest concentration of SARS-CoV-2 viral copies this assay can detect is 138 copies/mL. A negative result does not preclude SARS-Cov-2 infection and should not be used as the sole basis for treatment or other patient management decisions. A negative result may occur with  improper specimen collection/handling, submission of specimen other than nasopharyngeal swab, presence of viral mutation(s) within the areas targeted by this assay, and inadequate number of viral copies(<138 copies/mL). A negative result must be combined with clinical observations, patient history, and epidemiological information. The expected result is Negative.  Fact Sheet for Patients:  EntrepreneurPulse.com.au  Fact Sheet for Healthcare Providers:  IncredibleEmployment.be  This test is no t yet approved or  cleared by the [place] and  has been authorized for detection and/or diagnosis of SARS-CoV-2 by FDA under an Emergency Use Authorization (EUA). This EUA will remain  in effect (meaning this test can be used) for the duration of the COVID-19 declaration under Section 564(b)(1) of the Act,  21 U.S.C.section 360bbb-3(b)(1), unless the authorization is terminated  or revoked sooner.       Influenza A by PCR NEGATIVE NEGATIVE Final   Influenza B by PCR NEGATIVE NEGATIVE Final    Comment: (NOTE) The Xpert Xpress SARS-CoV-2/FLU/RSV plus assay is intended as an aid in the diagnosis of influenza from Nasopharyngeal swab specimens and should not be used as a sole basis for treatment. Nasal washings and aspirates are unacceptable for Xpert Xpress SARS-CoV-2/FLU/RSV testing.  Fact Sheet for Patients: EntrepreneurPulse.com.au  Fact Sheet for Healthcare Providers: IncredibleEmployment.be  This test is not yet approved or cleared by the [place] FDA and has been authorized for detection and/or diagnosis of SARS-CoV-2 by FDA under an Emergency Use Authorization (EUA). This EUA will remain in effect (meaning this test can be used) for the duration of the COVID-19 declaration under Section 564(b)(1) of the Act, 21 U.S.C. section 360bbb-3(b)(1), unless the authorization is terminated or revoked.  Performed at [facility], [address] [place], [place] [address]     Coagulation Studies: Recent Labs    05/24/21 0455  LABPROT 25.3*  INR 2.3*    Urinalysis: No results for input(s): COLORURINE, LABSPEC, PHURINE, GLUCOSEU, HGBUR, BILIRUBINUR, KETONESUR, PROTEINUR, UROBILINOGEN, NITRITE, LEUKOCYTESUR in the last 72 hours.  Invalid input(s): APPERANCEUR    Imaging: DG Chest 2 View  Result Date: 05/24/2021 CLINICAL DATA:  Cough.  Weight gain EXAM: CHEST - 2 VIEW COMPARISON:  03/15/2021 FINDINGS: Diffuse interstitial coarsening above prior baseline. Mild cardiomegaly. Stable upper mediastinal contours. No significant effusion or focal consolidation. IMPRESSION: Increased interstitial markings compared to prior and compatible with interstitial edema. There is a background of chronic lung disease/fibrosis by prior CTs.  Electronically Signed   By: Monte Fantasia M.D.   On: 05/24/2021 05:39     Medications:     aspirin EC  81 mg Oral Daily   [START ON 05/25/2021] furosemide  80 mg Intravenous Q12H   warfarin  2 mg Oral Once per day on Sun Tue Thu Fri   And   [START ON 05/25/2021] warfarin  4 mg Oral Once per day on Mon Wed Sat   acetaminophen (TYLENOL) oral liquid 160 mg/5 mL, albuterol, dextromethorphan-guaiFENesin, hydrALAZINE, nitroGLYCERIN, ondansetron (ZOFRAN) IV  Assessment/ Plan:  [name] is a 80 y.o.  female with past medical history of anemia, Atrial Fib, CHD, CHF diabetes, and hypertension. Patient reports having trouble breathing and having excess fluid. Patient will be admitted for Acute on chronic diastolic congestive heart failure (HCC) [I50.33]   Chronic kidney disease stage 4 with creatinine better than baseline, Will monitor labs and use of diuretics May require Spironolactone  Lab Results  Component Value Date   CREATININE 1.94 (H) 05/24/2021   CREATININE 2.33 (H) 03/16/2021   CREATININE 2.31 (H) 03/15/2021    Intake/Output Summary (Last 24 hours) at 05/24/2021 1225 Last data filed at 05/24/2021 0947 Gross per 24 hour  Intake --  Output 1800 ml  Net -1800 ml   2. Acute respiratory distress likely secondary to acute exacerbation of heart failure. Currently on room air, DOE. IV Furosemide ordered   3. Hypertension: 176/46 Home medications include Hydralazine, currently ordered PRN  LOS: 0   8/2/202212:25 PM

## 2021-05-24 NOTE — ED Notes (Signed)
Assisted pt to restroom, pt with increased shortness of breath and wheezing on exertion, saturations 93% on return to room.

## 2021-05-24 NOTE — Progress Notes (Addendum)
ANTICOAGULATION CONSULT NOTE - Initial Consult  Pharmacy Consult for Warfarin management Indication: atrial fibrillation  No Known Allergies  Vital Signs: Temp: 97.7 F (36.5 C) (08/02 0451) BP: 175/40 (08/02 0830) Pulse Rate: 54 (08/02 0843)  Labs: Recent Labs    05/24/21 0455 05/24/21 0742  HGB 9.7*  --   HCT 29.3*  --   PLT 245  --   LABPROT 25.3*  --   INR 2.3*  --   CREATININE 1.94*  --   TROPONINIHS 15 16    Medical History: Past Medical History:  Diagnosis Date   Anemia    Atrial fibrillation (HCC)    Chronic kidney disease    Congestive heart failure (CHF) (HCC)    Diabetes mellitus without complication (HCC)    GERD (gastroesophageal reflux disease)    Hyperlipidemia    Hypertension    Pneumonia    Pulmonary fibrosis (HCC)     Medications:  Scheduled:   aspirin EC  81 mg Oral Daily   [START ON 05/25/2021] furosemide  80 mg Intravenous Q12H   iohexol  500 mL Oral Q1H   warfarin  2 mg Oral Once per day on Sun Mon Tue Thu Fri   And   [START ON 05/25/2021] warfarin  4 mg Oral Once per day on Wed Sat    Assessment: Patient anticoagulation stable. Admitted with chest pain and shortness of breath. PMH includes HFpEF, ATRIAL FIBRILLATION, CKD, HTN, HLP, and pulmonary fibrosis.  Renal function and Hgb stable. INR at 2.3 today.  Goal of Therapy:  INR 2-3    Plan:  Continue stable home dose of 2mg  daily except for 4mg  every Wednesday and Saturday. Check INR with morning blood work.  Sheralyn Pinegar Rodriguez-Guzman PharmD, BCPS 05/24/2021 11:21 AM   Correction: Most recent home dose warfarin 2mg  daily except for 4mg  Mondays, Wednesdays, and Saturdays.  Aadil Sur Rodriguez-Guzman PharmD, BCPS 05/24/2021 11:50 AM

## 2021-05-24 NOTE — ED Notes (Addendum)
Pt ambulatory in room with spo2 monitor. Pt maintained O2 sats of 93-96% on RA, RR up to 30. HR increased from 51 when resting to 126 with short distance ambulated. Pt reports increased weakness. Assisted back in bed. MD [place] notified.

## 2021-05-24 NOTE — ED Provider Notes (Signed)
[facility] Emergency Department Provider Note  ____________________________________________  Time seen: Approximately 5:59 AM  I have reviewed the triage vital signs and the nursing notes.   HISTORY  Chief Complaint Chest Pain and Shortness of Breath   HPI [name] is a 80 y.o. female with a history of CHF with a EF of 60 to 65%, atrial fibrillation on Coumadin, chronic kidney disease, diabetes, hypertension, hyperlipidemia, pulmonary fibrosis who presents for evaluation of chest pain.  History is gathered from patient and her daughter who was at bedside.  Patient has had a week of a dry cough.  Saw her primary care doctor and was placed on a Z-Pak and prednisone which she is still taking.  Over the last 24 hours she developed chest pain which she describes as a sharp pain located in the center lower part of her chest that is only present when she breathes in and out.  She has had no fever.  She does endorse mild shortness of breath and has had intermittent wheezing over the last 24 hours.  Her daughter weighs her on a daily basis and reports a 10 pound weight gain in the last 48 hours.  Patient reports increased abdominal girth.  Denies abdominal pain.  No missed doses of Lasix.  Patient has had no prior history of PE or DVT, she has no hemoptysis, no leg pain or swelling, and has not missed any doses of her Coumadin.  Past Medical History:  Diagnosis Date   Anemia    Atrial fibrillation (HCC)    Chronic kidney disease    Congestive heart failure (CHF) (HCC)    Diabetes mellitus without complication (HCC)    GERD (gastroesophageal reflux disease)    Hyperlipidemia    Hypertension    Pneumonia    Pulmonary fibrosis ([place])     Patient Active Problem List   Diagnosis Date Noted   Chronic respiratory failure with hypoxia ([place]) 03/16/2021   SOB (shortness of breath) 03/15/2021   Acute renal failure superimposed on stage 4 chronic kidney disease ([place])  02/08/2021   Anemia associated with stage 4 chronic renal failure ([place]) 02/08/2021   Anemia due to stage 5 chronic kidney disease ([place]) 02/07/2021   Acute CHF (congestive heart failure) ([place]) 02/06/2021   Acute on chronic diastolic CHF (congestive heart failure) ([place]) 02/06/2021   Hyperkalemia 02/06/2021   Weakness 12/24/2020   Hypertensive urgency 12/23/2020   Nicotine dependence 12/23/2020   History of anemia due to chronic kidney disease 12/23/2020   Pain due to onychomycosis of toenails of both feet 02/12/2020   Uncontrolled type 2 diabetes mellitus with hyperglycemia ([place]) 05/20/2019   Acute recurrent pansinusitis 05/11/2019   Acute otitis media 05/11/2019   Acute pain of left shoulder 05/11/2019   Long term (current) use of anticoagulants 04/03/2019   Chronic left shoulder pain 03/18/2019   Sepsis ([place]) 01/12/2018   Colitis 01/12/2018   CKD (chronic kidney disease), stage III ([place]) 01/12/2018   Diabetes ([place]) 12/28/2017   AF (paroxysmal atrial fibrillation) ([place]) 12/28/2017   Encounter for general adult medical examination with abnormal findings 12/28/2017   Primary generalized (osteo)arthritis 12/28/2017   Hypertension 12/27/2017   GERD (gastroesophageal reflux disease) 12/27/2017   Hematuria 03/18/2015   Chronic combined systolic and diastolic heart failure ([place]) 03/01/2012   Obesity 03/01/2012   CKD stage 4 due to type 2 diabetes mellitus ([place]) 03/01/2012   Other benign neoplasm of connective and other soft tissue of lower limb, including hip  03/15/2011   Neoplasm of connective tissue 02/01/2011   Pain in joint, pelvic region and thigh 02/01/2011   Type 2 diabetes mellitus with stage 5 chronic kidney disease ([place]) 10/03/2006   Encounter for current long-term use of anticoagulants 02/07/2006   Essential hypertension 09/24/2004   Atrial fibrillation ([place]) 06/07/2004    Past Surgical History:  Procedure Laterality Date   ABDOMINAL HYSTERECTOMY     APPENDECTOMY      CATARACT EXTRACTION     CHOLECYSTECTOMY     HERNIA REPAIR     right knee replacement     right nephroectomy      Prior to Admission medications   Medication Sig Start Date End Date Taking? Authorizing Provider  acetaminophen (TYLENOL) 325 MG tablet Take 650 mg by mouth every 6 (six) hours as needed.    [provider]  atorvastatin (LIPITOR) 10 MG tablet Take 1 tablet (10 mg total) by mouth every evening. 12/06/20   McDonough, Si Gaul, PA-C  azithromycin (ZITHROMAX) 250 MG tablet Take 1 tablet by mouth daily for 10 days 05/17/21   Lavera Guise, MD  benzonatate (TESSALON) 200 MG capsule Take 1 capsule (200 mg total) by mouth 2 (two) times daily as needed for cough. 02/24/21   Allyne Gee, MD  bisacodyl (DULCOLAX) 5 MG EC tablet Take 1 tablet (5 mg total) by mouth daily as needed for moderate constipation. 12/29/20   Val Riles, MD  Blood Pressure Monitor KIT Use three times daily to check blood pressure  DX:  I50.42 01/13/21   Lavera Guise, MD  citalopram (CELEXA) 40 MG tablet Take 1 tablet by mouth once daily 05/07/21   Lavera Guise, MD  donepezil (ARICEPT) 5 MG tablet TAKE 1 TABLET BY MOUTH ONCE DAILY FOR MEMORY Patient taking differently: Take 5 mg by mouth daily. TAKE 1 TABLET BY MOUTH ONCE DAILY FOR MEMORY 12/06/20   McDonough, Si Gaul, PA-C  ferrous sulfate 325 (65 FE) MG tablet Take 1 tablet (325 mg total) by mouth daily with breakfast. 12/30/20   Val Riles, MD  fluticasone (FLONASE) 50 MCG/ACT nasal spray Place 1 spray into both nostrils daily. 02/03/21   McDonough, Si Gaul, PA-C  furosemide (LASIX) 40 MG tablet Take 1 tablet (40 mg total) by mouth daily. 03/16/21 03/16/22  Kathie Dike, MD  hydrALAZINE (APRESOLINE) 25 MG tablet Take 25 mg by mouth 3 (three) times daily as needed for high blood pressure. May take an additional tablet if BP is elevated. Hold if SBP less than 130 mmhg 03/14/21 04/13/21  [provider]  linaclotide Rolan Lipa) 145 MCG CAPS capsule Take  1 capsule (145 mcg total) by mouth daily before breakfast. 04/19/21   Lavera Guise, MD  melatonin 3 MG TABS tablet Take 3 mg by mouth at bedtime as needed (sleep).    [provider]  pantoprazole (PROTONIX) 40 MG tablet Take 1 tablet (40 mg total) by mouth 2 (two) times daily. 10/20/20   Lavera Guise, MD  polyethylene glycol (MIRALAX) 17 g packet Take 17 g by mouth daily. Patient taking differently: Take 17 g by mouth daily as needed for mild constipation. 12/29/20   Val Riles, MD  predniSONE (DELTASONE) 10 MG tablet take one tab 3 X day for 3 days, then take one tab 2 X a day for 3 days and then take one tab a day for 3 days for COPD 05/17/21   Lavera Guise, MD  promethazine (PHENERGAN) 12.5 MG tablet Take 1  tablet (12.5 mg total) by mouth every 8 (eight) hours as needed for nausea or vomiting. 01/24/21   McDonough, Si Gaul, PA-C  rOPINIRole (REQUIP) 0.5 MG tablet Take 1 tablet by mouth in the evening 05/07/21   Lavera Guise, MD  traMADol (ULTRAM) 50 MG tablet Take 1 tablet (50 mg total) by mouth 2 (two) times daily as needed for up to 5 days. 03/28/21 04/02/21  Jonetta Osgood, NP  vitamin B-12 1000 MCG tablet Take 1 tablet (1,000 mcg total) by mouth daily. 12/30/20   Val Riles, MD  warfarin (COUMADIN) 2 MG tablet Take 1 tablet (2 mg total) by mouth daily. 12/06/20   McDonough, Si Gaul, PA-C  warfarin (COUMADIN) 2 MG tablet Take 1 tablet (2 mg total) by mouth daily. Except on wed and sat take 4 mg 05/04/21   Lavera Guise, MD    Allergies Patient has no known allergies.  Family History  Problem Relation Age of Onset   Diabetes Mother    Breast cancer Mother    Heart disease Father    Diabetes Father    Diabetes Sister    Diabetes Brother    Cancer Brother     Social History Social History   Tobacco Use   Smoking status: Former    Packs/day: 1.00    Types: Cigarettes    Quit date: 02/10/2021    Years since quitting: 0.2   Smokeless tobacco: Never  Vaping Use    Vaping Use: Never used  Substance Use Topics   Alcohol use: No   Drug use: No    Review of Systems  Constitutional: Negative for fever. Eyes: Negative for visual changes. ENT: Negative for sore throat. Neck: No neck pain  Cardiovascular: + chest pain. Respiratory: + shortness of breath, cough, wheezing Gastrointestinal: Negative for abdominal pain, vomiting or diarrhea. Genitourinary: Negative for dysuria. Musculoskeletal: Negative for back pain. Skin: Negative for rash. Neurological: Negative for headaches, weakness or numbness. Psych: No SI or HI  ____________________________________________   PHYSICAL EXAM:  VITAL SIGNS: Vitals:   05/24/21 0557 05/24/21 0600  BP: (!) 184/58 (!) 172/44  Pulse: 63 (!) 57  Resp: (!) 28 (!) 21  Temp:    SpO2: 98% 99%    Constitutional: Alert and oriented. Well appearing and in no apparent distress. HEENT:      Head: Normocephalic and atraumatic.         Eyes: Conjunctivae are normal. Sclera is non-icteric.       Mouth/Throat: Mucous membranes are moist.       Neck: Supple with no signs of meningismus. Cardiovascular: Regular rate and rhythm. No murmurs, gallops, or rubs. 2+ symmetrical distal pulses are present in all extremities. No JVD. Respiratory: Normal respiratory effort. Lungs are clear to auscultation bilaterally.  Gastrointestinal: Soft, non tender, and non distended with positive bowel sounds. No rebound or guarding. Genitourinary: No CVA tenderness. Musculoskeletal:  No edema, cyanosis, or erythema of extremities. Neurologic: Normal speech and language. Face is symmetric. Moving all extremities. No gross focal neurologic deficits are appreciated. Skin: Skin is warm, dry and intact. No rash noted. Psychiatric: Mood and affect are normal. Speech and behavior are normal.  ____________________________________________   LABS (all labs ordered are listed, but only abnormal results are displayed)  Labs Reviewed  BASIC  METABOLIC PANEL - Abnormal; Notable for the following components:      Result Value   Glucose, Bld 196 (*)    BUN 47 (*)    Creatinine, Ser 1.94 (*)  Calcium 8.7 (*)    GFR, Estimated 26 (*)    All other components within normal limits  CBC - Abnormal; Notable for the following components:   RBC 3.15 (*)    Hemoglobin 9.7 (*)    HCT 29.3 (*)    All other components within normal limits  BRAIN NATRIURETIC PEPTIDE - Abnormal; Notable for the following components:   B Natriuretic Peptide 1,334.6 (*)    All other components within normal limits  PROTIME-INR - Abnormal; Notable for the following components:   Prothrombin Time 25.3 (*)    INR 2.3 (*)    All other components within normal limits  RESP PANEL BY RT-PCR (FLU A&B, COVID) ARPGX2  TROPONIN I (HIGH SENSITIVITY)  TROPONIN I (HIGH SENSITIVITY)   ____________________________________________  EKG  ED ECG REPORT I, Rudene Re, the attending physician, personally viewed and interpreted this ECG.  Normal sinus rhythm with a rate of 60, normal intervals, normal axis, no ST elevations or depressions ____________________________________________  RADIOLOGY  I have personally reviewed the images performed during this visit and I agree with the Radiologist's read.   Interpretation by Radiologist:  DG Chest 2 View  Result Date: 05/24/2021 CLINICAL DATA:  Cough.  Weight gain EXAM: CHEST - 2 VIEW COMPARISON:  03/15/2021 FINDINGS: Diffuse interstitial coarsening above prior baseline. Mild cardiomegaly. Stable upper mediastinal contours. No significant effusion or focal consolidation. IMPRESSION: Increased interstitial markings compared to prior and compatible with interstitial edema. There is a background of chronic lung disease/fibrosis by prior CTs. Electronically Signed   By: Monte Fantasia M.D.   On: 05/24/2021 05:39     ____________________________________________   PROCEDURES  Procedure(s) performed:yes .1-3 Lead  EKG Interpretation  Date/Time: 05/24/2021 6:12 AM Performed by: Rudene Re, MD Authorized by: Rudene Re, MD     Interpretation: non-specific     ECG rate assessment: normal     Rhythm: sinus rhythm     Ectopy: none     Conduction: normal     Critical Care performed:  None ____________________________________________   INITIAL IMPRESSION / ASSESSMENT AND PLAN / ED COURSE  80 y.o. female with a history of CHF with a EF of 60 to 65%, atrial fibrillation on Coumadin, chronic kidney disease, diabetes, hypertension, hyperlipidemia, pulmonary fibrosis who presents for evaluation of chest pain x 24 hrs in the setting of a week of a dry cough, intermittent wheezing, unintentional 10 pound weight gain and increasing abdominal girth.  Patient is otherwise well-appearing in no distress with normal work of breathing and normal sats, she has no wheezing and is moving good air on exam.  She has mild edema on her abdominal wall, no leg swelling.  EKG shows no signs of ischemia or dysrhythmias.  Chest x-ray consistent with pulmonary edema but no signs of pneumonia.  BNP is elevated at 1334.  First troponin is negative.  Kidney function is improved when compared to prior with a creatinine of 1.94.  No significant electrolyte derangements.  No signs of sepsis.  At this time patient's presentation is consistent with mild bronchitis and CHF exacerbation.  Low suspicion for PE with a therapeutic INR, no tachypnea, no tachycardia, no hypoxia.  COVID and flu are pending.  We will give a dose of IV Lasix and reassess for disposition.  History gathered from patient and her daughter who is at bedside.  Plan discussed with both of them.  Patient placed on telemetry for close monitoring of cardiorespiratory status.  Old medical records reviewed.   _________________________  7:14 AM on 05/24/2021 ----------------------------------------- Patient diuresing well. Plan to repeat second troponin at 7 AM and get  ambulatory sats after patient diuresis.  If she remains stable with no further episodes of chest pain we will plan to discharge home to the care of her daughter who is at bedside.  Plan discussed with Dr. Charna Archer.     _____________________________________________ Please note:  Patient was evaluated in Emergency Department today for the symptoms described in the history of present illness. Patient was evaluated in the context of the global COVID-19 pandemic, which necessitated consideration that the patient might be at risk for infection with the SARS-CoV-2 virus that causes COVID-19. Institutional protocols and algorithms that pertain to the evaluation of patients at risk for COVID-19 are in a state of rapid change based on information released by regulatory bodies including the CDC and federal and state organizations. These policies and algorithms were followed during the patient's care in the ED.  Some ED evaluations and interventions may be delayed as a result of limited staffing during the pandemic.   [place] Controlled Substance Database was reviewed by me. ____________________________________________   FINAL CLINICAL IMPRESSION(S) / ED DIAGNOSES   Final diagnoses:  Acute on chronic congestive heart failure, unspecified heart failure type ([place])      NEW MEDICATIONS STARTED DURING THIS VISIT:  ED Discharge Orders     None        Note:  This document was prepared using Dragon voice recognition software and may include unintentional dictation errors.    Rudene Re, MD 05/24/21 [phone]

## 2021-05-24 NOTE — ED Triage Notes (Signed)
Per pt daughter, pt weight is up from 125lbs to 133lbs since Sunday. C/o pain in the center of the chest when she breathes in. Says she feels bloated.  Has had a cough, finishing dose of steroid and abx. COVID negative last week. Taking lasix as prescribed. Pt is legally blind.

## 2021-05-24 NOTE — Discharge Instructions (Signed)
Follow up with your doctor in 2 days.  Return to the emergency room for new or worsening chest pain or shortness of breath.

## 2021-05-24 NOTE — H&P (Addendum)
History and Physical    [name] RCV:[id number] DOB: 1941/05/17 DOA: 05/24/2021  Referring MD/NP/PA:   PCP: Lavera Guise, MD   Patient coming from:  The patient is coming from home.    Chief Complaint: SOB  HPI: [name] is a 80 y.o. female with medical history significant of HTN, HLD, DM, pulmonary fibrosis, dCHF, s/p of right nephrectomy, CKD stage IV, atrial fibrillation on Coumadin, anemia, colitis, GERD, depression, dementia, legally blindness, who presents with shortness breath.  Pt state that she has SOB for more than 1 week, which has been progressively worsening.  Patient has a dry cough, no fever or chills.  Patient states that she had some mild chest pain earlier, which has resolved.  Currently no chest pain.  Patient also reports epigastric abdominal pain, which is dull, mild, nonradiating.  Denies nausea, vomiting, diarrhea.  No symptoms of UTI.  Patient is legally blind.  No unilateral numbness or tingling's.  Per her daughter, patient has more than 10 pounds weight gain recently.  ED Course: pt was found to have BNP 1334, troponin level 15, 16, negative COVID PCR, stable renal function, temperature normal, blood pressure 205/56, 175/40, heart rate 50-60s, RR 28, oxygen saturation 95% on room air.  Chest x-ray showed fibrosis and interstitial pulmonary edema.  Patient is admitted to progressive bed as inpatient  Review of Systems:   General: no fevers, chills, has body weight gain, has fatigue HEENT: blind, no hearing changes or sore throat Respiratory: has dyspnea, coughing, no wheezing CV: no chest pain, no palpitations GI: no nausea, vomiting, has abdominal pain, no diarrhea, constipation GU: no dysuria, burning on urination, increased urinary frequency, hematuria  Ext: no leg edema Neuro: no unilateral weakness, numbness, or tingling, no vision change or hearing loss Skin: no rash, no skin tear. MSK: No muscle spasm, no deformity, no limitation of range of  movement in spin Heme: No easy bruising.  Travel history: No recent long distant travel.  Allergy: No Known Allergies  Past Medical History:  Diagnosis Date   Anemia    Atrial fibrillation (HCC)    Chronic kidney disease    Congestive heart failure (CHF) (HCC)    Diabetes mellitus without complication (HCC)    GERD (gastroesophageal reflux disease)    Hyperlipidemia    Hypertension    Pneumonia    Pulmonary fibrosis ([place])     Past Surgical History:  Procedure Laterality Date   ABDOMINAL HYSTERECTOMY     APPENDECTOMY     CATARACT EXTRACTION     CHOLECYSTECTOMY     HERNIA REPAIR     right knee replacement     right nephroectomy      Social History:  reports that she quit smoking about 3 months ago. Her smoking use included cigarettes. She smoked an average of 1 pack per day. She has never used smokeless tobacco. She reports that she does not drink alcohol and does not use drugs.  Family History:  Family History  Problem Relation Age of Onset   Diabetes Mother    Breast cancer Mother    Heart disease Father    Diabetes Father    Diabetes Sister    Diabetes Brother    Cancer Brother      Prior to Admission medications   Medication Sig Start Date End Date Taking? Authorizing Provider  acetaminophen (TYLENOL) 325 MG tablet Take 650 mg by mouth every 6 (six) hours as needed.    [provider]  atorvastatin (LIPITOR) 10 MG tablet Take 1 tablet (10 mg total) by mouth every evening. 12/06/20   McDonough, Si Gaul, PA-C  azithromycin (ZITHROMAX) 250 MG tablet Take 1 tablet by mouth daily for 10 days 05/17/21   Lavera Guise, MD  benzonatate (TESSALON) 200 MG capsule Take 1 capsule (200 mg total) by mouth 2 (two) times daily as needed for cough. 02/24/21   Allyne Gee, MD  bisacodyl (DULCOLAX) 5 MG EC tablet Take 1 tablet (5 mg total) by mouth daily as needed for moderate constipation. 12/29/20   Val Riles, MD  Blood Pressure Monitor KIT Use three times daily to  check blood pressure  DX:  I50.42 01/13/21   Lavera Guise, MD  citalopram (CELEXA) 40 MG tablet Take 1 tablet by mouth once daily 05/07/21   Lavera Guise, MD  donepezil (ARICEPT) 5 MG tablet TAKE 1 TABLET BY MOUTH ONCE DAILY FOR MEMORY Patient taking differently: Take 5 mg by mouth daily. TAKE 1 TABLET BY MOUTH ONCE DAILY FOR MEMORY 12/06/20   McDonough, Si Gaul, PA-C  ferrous sulfate 325 (65 FE) MG tablet Take 1 tablet (325 mg total) by mouth daily with breakfast. 12/30/20   Val Riles, MD  fluticasone (FLONASE) 50 MCG/ACT nasal spray Place 1 spray into both nostrils daily. 02/03/21   McDonough, Si Gaul, PA-C  furosemide (LASIX) 40 MG tablet Take 1 tablet (40 mg total) by mouth daily. 03/16/21 03/16/22  Kathie Dike, MD  hydrALAZINE (APRESOLINE) 25 MG tablet Take 25 mg by mouth 3 (three) times daily as needed for high blood pressure. May take an additional tablet if BP is elevated. Hold if SBP less than 130 mmhg 03/14/21 04/13/21  [provider]  linaclotide Rolan Lipa) 145 MCG CAPS capsule Take 1 capsule (145 mcg total) by mouth daily before breakfast. 04/19/21   Lavera Guise, MD  melatonin 3 MG TABS tablet Take 3 mg by mouth at bedtime as needed (sleep).    [provider]  pantoprazole (PROTONIX) 40 MG tablet Take 1 tablet (40 mg total) by mouth 2 (two) times daily. 10/20/20   Lavera Guise, MD  polyethylene glycol (MIRALAX) 17 g packet Take 17 g by mouth daily. Patient taking differently: Take 17 g by mouth daily as needed for mild constipation. 12/29/20   Val Riles, MD  predniSONE (DELTASONE) 10 MG tablet take one tab 3 X day for 3 days, then take one tab 2 X a day for 3 days and then take one tab a day for 3 days for COPD 05/17/21   Lavera Guise, MD  promethazine (PHENERGAN) 12.5 MG tablet Take 1 tablet (12.5 mg total) by mouth every 8 (eight) hours as needed for nausea or vomiting. 01/24/21   McDonough, Si Gaul, PA-C  rOPINIRole (REQUIP) 0.5 MG tablet Take 1 tablet by mouth  in the evening 05/07/21   Lavera Guise, MD  traMADol (ULTRAM) 50 MG tablet Take 1 tablet (50 mg total) by mouth 2 (two) times daily as needed for up to 5 days. 03/28/21 04/02/21  Jonetta Osgood, NP  vitamin B-12 1000 MCG tablet Take 1 tablet (1,000 mcg total) by mouth daily. 12/30/20   Val Riles, MD  warfarin (COUMADIN) 2 MG tablet Take 1 tablet (2 mg total) by mouth daily. 12/06/20   McDonough, Si Gaul, PA-C  warfarin (COUMADIN) 2 MG tablet Take 1 tablet (2 mg total) by mouth daily. Except on wed and sat take 4 mg 05/04/21   Clayborn Bigness  M, MD    Physical Exam: Vitals:   05/24/21 0840 05/24/21 0842 05/24/21 0843 05/24/21 1128  BP:    (!) 195/43  Pulse: 63 62 (!) 54   Resp:  11 17   Temp:      SpO2: 95% 97% 95%    General: Not in acute distress HEENT:       Eyes:  no scleral icterus.  Legally blindness.       ENT: No discharge from the ears and nose, no pharynx injection, no tonsillar enlargement.        Neck: positive JVD, no bruit, no mass felt. Heme: No neck lymph node enlargement. Cardiac: S1/S2, RRR, No gallops or rubs. Respiratory: has decreased air movement bilaterally, has fine crackles at base bilaterally GI: Soft, nondistended, has tenderness in epigastric area, no rebound pain, no organomegaly, BS present. GU: No hematuria Ext: No pitting leg edema bilaterally. 1+DP/PT pulse bilaterally. Musculoskeletal: No joint deformities, No joint redness or warmth, no limitation of ROM in spin. Skin: No rashes.  Neuro: Alert, oriented X3, cranial nerves IV-XII grossly intact, moves all extremities normally. Psych: Patient is not psychotic, no suicidal or hemocidal ideation.  Labs on Admission: I have personally reviewed following labs and imaging studies  CBC: Recent Labs  Lab 05/24/21 0455  WBC 8.2  HGB 9.7*  HCT 29.3*  MCV 93.0  PLT 675   Basic Metabolic Panel: Recent Labs  Lab 05/24/21 0455  NA 136  K 3.8  CL 104  CO2 25  GLUCOSE 196*  BUN 47*  CREATININE  1.94*  CALCIUM 8.7*   GFR: CrCl cannot be calculated (Unknown ideal weight.). Liver Function Tests: No results for input(s): AST, ALT, ALKPHOS, BILITOT, PROT, ALBUMIN in the last 168 hours. Recent Labs  Lab 05/24/21 0742  LIPASE 32   No results for input(s): AMMONIA in the last 168 hours. Coagulation Profile: Recent Labs  Lab 05/24/21 0455  INR 2.3*   Cardiac Enzymes: No results for input(s): CKTOTAL, CKMB, CKMBINDEX, TROPONINI in the last 168 hours. BNP (last 3 results) No results for input(s): PROBNP in the last 8760 hours. HbA1C: No results for input(s): HGBA1C in the last 72 hours. CBG: No results for input(s): GLUCAP in the last 168 hours. Lipid Profile: No results for input(s): CHOL, HDL, LDLCALC, TRIG, CHOLHDL, LDLDIRECT in the last 72 hours. Thyroid Function Tests: No results for input(s): TSH, T4TOTAL, FREET4, T3FREE, THYROIDAB in the last 72 hours. Anemia Panel: No results for input(s): VITAMINB12, FOLATE, FERRITIN, TIBC, IRON, RETICCTPCT in the last 72 hours. Urine analysis:    Component Value Date/Time   COLORURINE YELLOW (A) 12/23/2020 1040   APPEARANCEUR Clear 03/28/2021 1703   LABSPEC 1.013 12/23/2020 1040   PHURINE 6.0 12/23/2020 1040   GLUCOSEU Negative 03/28/2021 1703   HGBUR MODERATE (A) 12/23/2020 1040   BILIRUBINUR Negative 03/28/2021 1703   KETONESUR NEGATIVE 12/23/2020 1040   PROTEINUR 2+ (A) 03/28/2021 1703   PROTEINUR >=300 (A) 12/23/2020 1040   UROBILINOGEN 0.2 09/02/2019 1441   NITRITE Negative 03/28/2021 1703   NITRITE NEGATIVE 12/23/2020 1040   LEUKOCYTESUR Negative 03/28/2021 1703   LEUKOCYTESUR NEGATIVE 12/23/2020 1040   Sepsis Labs: _0 (procalcitonin:4,lacticidven:4) ) Recent Results (from the past 240 hour(s))  Resp Panel by RT-PCR (Flu A&B, Covid) Nasopharyngeal Swab     Status: None   Collection Time: 05/24/21  5:44 AM   Specimen: Nasopharyngeal Swab; Nasopharyngeal(NP) swabs in vial transport medium  Result Value  Ref Range Status   SARS Coronavirus 2 by  RT PCR NEGATIVE NEGATIVE Final    Comment: (NOTE) SARS-CoV-2 target nucleic acids are NOT DETECTED.  The SARS-CoV-2 RNA is generally detectable in upper respiratory specimens during the acute phase of infection. The lowest concentration of SARS-CoV-2 viral copies this assay can detect is 138 copies/mL. A negative result does not preclude SARS-Cov-2 infection and should not be used as the sole basis for treatment or other patient management decisions. A negative result may occur with  improper specimen collection/handling, submission of specimen other than nasopharyngeal swab, presence of viral mutation(s) within the areas targeted by this assay, and inadequate number of viral copies(<138 copies/mL). A negative result must be combined with clinical observations, patient history, and epidemiological information. The expected result is Negative.  Fact Sheet for Patients:  EntrepreneurPulse.com.au  Fact Sheet for Healthcare Providers:  IncredibleEmployment.be  This test is no t yet approved or cleared by the [place] FDA and  has been authorized for detection and/or diagnosis of SARS-CoV-2 by FDA under an Emergency Use Authorization (EUA). This EUA will remain  in effect (meaning this test can be used) for the duration of the COVID-19 declaration under Section 564(b)(1) of the Act, 21 U.S.C.section 360bbb-3(b)(1), unless the authorization is terminated  or revoked sooner.       Influenza A by PCR NEGATIVE NEGATIVE Final   Influenza B by PCR NEGATIVE NEGATIVE Final    Comment: (NOTE) The Xpert Xpress SARS-CoV-2/FLU/RSV plus assay is intended as an aid in the diagnosis of influenza from Nasopharyngeal swab specimens and should not be used as a sole basis for treatment. Nasal washings and aspirates are unacceptable for Xpert Xpress SARS-CoV-2/FLU/RSV testing.  Fact Sheet for  Patients: EntrepreneurPulse.com.au  Fact Sheet for Healthcare Providers: IncredibleEmployment.be  This test is not yet approved or cleared by the [place] FDA and has been authorized for detection and/or diagnosis of SARS-CoV-2 by FDA under an Emergency Use Authorization (EUA). This EUA will remain in effect (meaning this test can be used) for the duration of the COVID-19 declaration under Section 564(b)(1) of the Act, 21 U.S.C. section 360bbb-3(b)(1), unless the authorization is terminated or revoked.  Performed at [facility], [address], [place], [place] [address]      Radiological Exams on Admission: DG Chest 2 View  Result Date: 05/24/2021 CLINICAL DATA:  Cough.  Weight gain EXAM: CHEST - 2 VIEW COMPARISON:  03/15/2021 FINDINGS: Diffuse interstitial coarsening above prior baseline. Mild cardiomegaly. Stable upper mediastinal contours. No significant effusion or focal consolidation. IMPRESSION: Increased interstitial markings compared to prior and compatible with interstitial edema. There is a background of chronic lung disease/fibrosis by prior CTs. Electronically Signed   By: Monte Fantasia M.D.   On: 05/24/2021 05:39     EKG: I have personally reviewed.  Sinus rhythm, QTC 476, low voltage, no ischemic change    Assessment/Plan Principal Problem:   Acute on chronic diastolic congestive heart failure (HCC) Active Problems:   GERD (gastroesophageal reflux disease)   Atrial fibrillation (HCC)   CKD stage 4 due to type 2 diabetes mellitus (HCC)   Essential hypertension   Anemia associated with stage 4 chronic renal failure (HCC)   Type II diabetes mellitus with renal manifestations (HCC)   HLD (hyperlipidemia)   Depression   Legally blind   Abdominal pain   Acute on chronic diastolic congestive heart failure ([place]): 2D echo on 12/22/2020 showed EF of 60 to 65%.  Patient does not have leg edema, but she has a positive  JVD, elevated  BNP 1334, interstitial pulmonary edema on chest x-ray, clinically consistent with CHF exacerbation and fluid overload.  No oxygen saturation on room air.   -Will admit to progressive unit as inpatient -Lasix 80 mg bid by IV (40 mg x 2 today) -2d echo -Daily weights -strict I/O's -Low salt diet -Fluid restriction -Obtain REDs Vest reading  Abdominal pain: Patient has epigastric abdominal pain, etiology is not clear. -Check lipase -Follow-up CT scan of abdomen/pelvis without contrast  GERD (gastroesophageal reflux disease) -Protonix  Atrial fibrillation (HCC): Heart rate 50-60s -Continue Coumadin  CKD stage 4 due to type 2 diabetes mellitus ([place]): Recent creatinine 2.33 on 03/16/2021.  Her creatinine is 1.94, BUN 47, stable -Monitor renal function closely by BMP -Consulted Dr. Juleen China of nephrology  Essential hypertension: Blood pressure 205/56, 175/40.  Patient is taking oral hydralazine 25 mg 3 times daily as needed at home -IV hydralazine as needed -Schedule hydralazine to 25 mg 3 times daily  Anemia associated with stage 4 chronic renal failure [facility]): Hemoglobin stable, 9.7 (9.1 on 03/16/2022 -Follow-up with CBC -Continue iron supplement  Diet controlled Type II diabetes mellitus with renal manifestations [facility]): Recent A1c 6.6, well controlled.  Patient not taking medications currently.  Blood sugar 196 today -Check CBG every morning  HLD (hyperlipidemia) -Lipitor  Depression:  -Celexa  Legally blind -Fall precaution          DVT ppx: On Coumadin Code Status: Full code Family Communication: Yes, patient's daughter by phone  Disposition Plan:  Anticipate discharge back to previous environment Consults called: Dr. Juleen China of nephrology Admission status and Level of care: Progressive Cardiac:    as inpt     Status is: Inpatient  Not inpatient appropriate, will call UM team and downgrade to OBS.   Dispo: The patient is from: Home               Anticipated d/c is to: Home              Patient currently is not medically stable to d/c.   Difficult to place patient No          Date of Service 05/24/2021    Ivor Costa [facility]   If 7PM-7AM, please contact night-coverage [email or web address] 05/24/2021, 12:23 PM

## 2021-05-24 NOTE — ED Provider Notes (Signed)
-----------------------------------------   7:12 AM on 05/24/2021 -----------------------------------------  Blood pressure (!) 172/44, pulse (!) 57, temperature 97.7 F (36.5 C), resp. rate (!) 21, SpO2 99 %.  Assuming care from Dr. Alfred Levins.  In short, [name] is a 80 y.o. female with a chief complaint of Chest Pain and Shortness of Breath .  Refer to the original H&P for additional details.  The current plan of care is to reassess following repeat troponin and diuresis.  ----------------------------------------- 9:13 AM on 05/24/2021 ----------------------------------------- Repeat troponin within normal limits, however patient with significant weakness when attempting to ambulate and heart rate noted to jump from the 60s to 126.  She was able to maintain O2 sats on room air but continues to have significant difficulty breathing with tachypnea following very mild activity.  Plan to discuss with hospitalist for further management of CHF exacerbation.    Blake Divine, MD 05/24/21 [phone]

## 2021-05-25 ENCOUNTER — Encounter: Payer: Self-pay | Admitting: Internal Medicine

## 2021-05-25 ENCOUNTER — Other Ambulatory Visit: Payer: Self-pay

## 2021-05-25 DIAGNOSIS — I480 Paroxysmal atrial fibrillation: Secondary | ICD-10-CM | POA: Diagnosis not present

## 2021-05-25 DIAGNOSIS — I10 Essential (primary) hypertension: Secondary | ICD-10-CM

## 2021-05-25 DIAGNOSIS — I5033 Acute on chronic diastolic (congestive) heart failure: Secondary | ICD-10-CM | POA: Diagnosis not present

## 2021-05-25 LAB — LIPID PANEL
Cholesterol: 125 mg/dL (ref 0–200)
HDL: 75 mg/dL (ref >40)
LDL Cholesterol: 35 mg/dL (ref 0–99)
Total CHOL/HDL Ratio: 1.7 RATIO
Triglycerides: 73 mg/dL (ref <150)
VLDL: 15 mg/dL (ref 0–40)

## 2021-05-25 LAB — BASIC METABOLIC PANEL
Anion gap: 8 (ref 5–15)
BUN: 56 mg/dL — ABNORMAL HIGH (ref 8–23)
CO2: 30 mmol/L (ref 22–32)
Calcium: 8.9 mg/dL (ref 8.9–10.3)
Chloride: 96 mmol/L — ABNORMAL LOW (ref 98–111)
Creatinine, Ser: 2.13 mg/dL — ABNORMAL HIGH (ref 0.44–1.00)
GFR, Estimated: 23 mL/min — ABNORMAL LOW (ref >60)
Glucose, Bld: 160 mg/dL — ABNORMAL HIGH (ref 70–99)
Potassium: 3.9 mmol/L (ref 3.5–5.1)
Sodium: 134 mmol/L — ABNORMAL LOW (ref 135–145)

## 2021-05-25 LAB — PROTIME-INR
INR: 2.1 — ABNORMAL HIGH (ref 0.8–1.2)
Prothrombin Time: 23.6 seconds — ABNORMAL HIGH (ref 11.4–15.2)

## 2021-05-25 LAB — MAGNESIUM: Magnesium: 2.1 mg/dL (ref 1.7–2.4)

## 2021-05-25 MED ORDER — FUROSEMIDE 10 MG/ML IJ SOLN
40.0000 mg | Freq: Two times a day (BID) | INTRAMUSCULAR | Status: DC
  Administered 2021-05-25: 40 mg via INTRAVENOUS
  Filled 2021-05-25: qty 4

## 2021-05-25 MED ORDER — METOPROLOL TARTRATE 25 MG PO TABS
12.5000 mg | ORAL_TABLET | Freq: Two times a day (BID) | ORAL | Status: DC
  Administered 2021-05-25 (×2): 12.5 mg via ORAL
  Filled 2021-05-25 (×2): qty 1

## 2021-05-25 NOTE — ED Notes (Signed)
Pt resting in bed with eyes closed. Blanket repositioned per pt request. Bed in lowest position and locked with bed rails up X 2. Call light within reach. No S/S of distress noted.

## 2021-05-25 NOTE — Progress Notes (Signed)
[facility]  ROUNDING NOTE   Subjective:   [name]  is a 80 y.o. female with past medical history of anemia, Atrial Fib, CHD, CHF diabetes, and hypertension. Patient reports having trouble breathing and having excess fluid. Patient will be admitted for Acute on chronic diastolic congestive heart failure ([place]) [I50.33]  Patient is known to our practice and currently sees Dr Juleen China.   Patient seen siting at side of bed Alert, completing breakfast Denies nausea and vomiting Denies shortness of breath States she feels better today States she is not ready for discharge  Objective:  Vital signs in last 24 hours:  Temp:  [98.1 F (36.7 C)-98.4 F (36.9 C)] 98.4 F (36.9 C) (08/03 0930) Pulse Rate:  [59-80] 78 (08/03 1300) Resp:  [12-24] 17 (08/03 1300) BP: (112-179)/(31-78) 158/64 (08/03 1333) SpO2:  [95 %-100 %] 100 % (08/03 1300) Weight:  [55 kg] 55 kg (08/03 0427)  Weight change:  Filed Weights   05/25/21 0427  Weight: 55 kg    Intake/Output: I/O last 3 completed shifts: In: -  Out: 3800 [Urine:3800]   Intake/Output this shift:  No intake/output data recorded.  Physical Exam: General: NAD, sitting bedside  Head: Normocephalic, atraumatic. Moist oral mucosal membranes  Eyes: Anicteric  Lungs:  Clear to auscultation, normal effort  Heart: Regular rate and rhythm  Abdomen:  Soft, nontender  Extremities:  no peripheral edema.  Neurologic: Nonfocal, moving all four extremities  Skin: No lesions       Basic Metabolic Panel: Recent Labs  Lab 05/24/21 0455 05/25/21 0506  NA 136 134*  K 3.8 3.9  CL 104 96*  CO2 25 30  GLUCOSE 196* 160*  BUN 47* 56*  CREATININE 1.94* 2.13*  CALCIUM 8.7* 8.9  MG  --  2.1     Liver Function Tests: No results for input(s): AST, ALT, ALKPHOS, BILITOT, PROT, ALBUMIN in the last 168 hours. Recent Labs  Lab 05/24/21 0742  LIPASE 32    No results for input(s): AMMONIA in the last 168  hours.  CBC: Recent Labs  Lab 05/24/21 0455  WBC 8.2  HGB 9.7*  HCT 29.3*  MCV 93.0  PLT 245     Cardiac Enzymes: No results for input(s): CKTOTAL, CKMB, CKMBINDEX, TROPONINI in the last 168 hours.  BNP: Invalid input(s): POCBNP  CBG: No results for input(s): GLUCAP in the last 168 hours.  Microbiology: Results for orders placed or performed during the hospital encounter of 05/24/21  Resp Panel by RT-PCR (Flu A&B, Covid) Nasopharyngeal Swab     Status: None   Collection Time: 05/24/21  5:44 AM   Specimen: Nasopharyngeal Swab; Nasopharyngeal(NP) swabs in vial transport medium  Result Value Ref Range Status   SARS Coronavirus 2 by RT PCR NEGATIVE NEGATIVE Final    Comment: (NOTE) SARS-CoV-2 target nucleic acids are NOT DETECTED.  The SARS-CoV-2 RNA is generally detectable in upper respiratory specimens during the acute phase of infection. The lowest concentration of SARS-CoV-2 viral copies this assay can detect is 138 copies/mL. A negative result does not preclude SARS-Cov-2 infection and should not be used as the sole basis for treatment or other patient management decisions. A negative result may occur with  improper specimen collection/handling, submission of specimen other than nasopharyngeal swab, presence of viral mutation(s) within the areas targeted by this assay, and inadequate number of viral copies(<138 copies/mL). A negative result must be combined with clinical observations, patient history, and epidemiological information. The expected result is Negative.  Fact Sheet for Patients:  EntrepreneurPulse.com.au  Fact Sheet for Healthcare Providers:  IncredibleEmployment.be  This test is no t yet approved or cleared by the [place] FDA and  has been authorized for detection and/or diagnosis of SARS-CoV-2 by FDA under an Emergency Use Authorization (EUA). This EUA will remain  in effect (meaning this test can be used)  for the duration of the COVID-19 declaration under Section 564(b)(1) of the Act, 21 U.S.C.section 360bbb-3(b)(1), unless the authorization is terminated  or revoked sooner.       Influenza A by PCR NEGATIVE NEGATIVE Final   Influenza B by PCR NEGATIVE NEGATIVE Final    Comment: (NOTE) The Xpert Xpress SARS-CoV-2/FLU/RSV plus assay is intended as an aid in the diagnosis of influenza from Nasopharyngeal swab specimens and should not be used as a sole basis for treatment. Nasal washings and aspirates are unacceptable for Xpert Xpress SARS-CoV-2/FLU/RSV testing.  Fact Sheet for Patients: EntrepreneurPulse.com.au  Fact Sheet for Healthcare Providers: IncredibleEmployment.be  This test is not yet approved or cleared by the [place] FDA and has been authorized for detection and/or diagnosis of SARS-CoV-2 by FDA under an Emergency Use Authorization (EUA). This EUA will remain in effect (meaning this test can be used) for the duration of the COVID-19 declaration under Section 564(b)(1) of the Act, 21 U.S.C. section 360bbb-3(b)(1), unless the authorization is terminated or revoked.  Performed at [facility], [address] [place], [place] [address]     Coagulation Studies: Recent Labs    05/24/21 0455 05/25/21 0506  LABPROT 25.3* 23.6*  INR 2.3* 2.1*     Urinalysis: No results for input(s): COLORURINE, LABSPEC, PHURINE, GLUCOSEU, HGBUR, BILIRUBINUR, KETONESUR, PROTEINUR, UROBILINOGEN, NITRITE, LEUKOCYTESUR in the last 72 hours.  Invalid input(s): APPERANCEUR    Imaging: CT ABDOMEN PELVIS WO CONTRAST  Result Date: 05/24/2021 CLINICAL DATA:  Acute onset abdominal pain. EXAM: CT ABDOMEN AND PELVIS WITHOUT CONTRAST TECHNIQUE: Multidetector CT imaging of the abdomen and pelvis was performed following the standard protocol without IV contrast. COMPARISON:  CT abdomen and pelvis 10/27/2019. FINDINGS: Lower chest: Mild  cardiomegaly. No pericardial effusion. Small bilateral pleural effusions are present, larger on the right. Interstitial coarsening and mild dependent atelectasis noted. Hepatobiliary: No focal liver abnormality is seen. Status post cholecystectomy. No biliary dilatation. Pancreas: Unremarkable. No pancreatic ductal dilatation or surrounding inflammatory changes. Spleen: Normal in size without focal abnormality. Adrenals/Urinary Tract: The adrenal glands appear normal. The patient is status post right nephrectomy. Left kidney and ureter are negative. Urinary bladder is unremarkable. Stomach/Bowel: Stomach is within normal limits. Status post appendectomy. No evidence of bowel wall thickening, distention, or inflammatory changes. Vascular/Lymphatic: Aortic atherosclerosis. No enlarged abdominal or pelvic lymph nodes. Reproductive: Uterus and bilateral adnexa are unremarkable. Other: Dependent at edema is seen in subcutaneous fatty tissues. No ascites. No hernia. Musculoskeletal: No acute or focal abnormality. IMPRESSION: No acute abnormality within the abdomen or pelvis. Small bilateral pleural effusions and dependent subcutaneous edema suggestive of volume overload. Mild cardiomegaly. Aortic Atherosclerosis (ICD10-I70.0). Electronically Signed   By: Inge Rise M.D.   On: 05/24/2021 12:57   DG Chest 2 View  Result Date: 05/24/2021 CLINICAL DATA:  Cough.  Weight gain EXAM: CHEST - 2 VIEW COMPARISON:  03/15/2021 FINDINGS: Diffuse interstitial coarsening above prior baseline. Mild cardiomegaly. Stable upper mediastinal contours. No significant effusion or focal consolidation. IMPRESSION: Increased interstitial markings compared to prior and compatible with interstitial edema. There is a background of chronic lung disease/fibrosis by prior CTs. Electronically Signed  By: Monte Fantasia M.D.   On: 05/24/2021 05:39     Medications:     aspirin EC  81 mg Oral Daily   atorvastatin  10 mg Oral QPM    citalopram  40 mg Oral Daily   donepezil  5 mg Oral Daily   ferrous sulfate  325 mg Oral Q breakfast   furosemide  40 mg Intravenous Q12H   hydrALAZINE  25 mg Oral Q8H   linaclotide  145 mcg Oral QAC breakfast   pantoprazole  40 mg Oral BID   rOPINIRole  0.5 mg Oral QPM   cyanocobalamin  1,000 mcg Oral Daily   warfarin  2 mg Oral Once per day on Sun Tue Thu Fri   And   warfarin  4 mg Oral Once per day on Mon Wed Sat   acetaminophen (TYLENOL) oral liquid 160 mg/5 mL, albuterol, dextromethorphan-guaiFENesin, hydrALAZINE, melatonin, nitroGLYCERIN, ondansetron (ZOFRAN) IV, traMADol  Assessment/ Plan:  [name] is a 80 y.o.  female with past medical history of anemia, Atrial Fib, CHD, CHF diabetes, and hypertension. Patient reports having trouble breathing and having excess fluid. Patient will be admitted for Acute on chronic diastolic congestive heart failure ([facility]) [I50.33]   Chronic kidney disease stage 4 with creatinine better than baseline, IV Lasix appropriate Creatinine slightly elevated but expected with diuretic Will continue to monitor labs  Will need office follow up after discharge.     Lab Results  Component Value Date   CREATININE 2.13 (H) 05/25/2021   CREATININE 1.94 (H) 05/24/2021   CREATININE 2.33 (H) 03/16/2021    Intake/Output Summary (Last 24 hours) at 05/25/2021 1503 Last data filed at 05/25/2021 0512 Gross per 24 hour  Intake --  Output 1000 ml  Net -1000 ml    2. Acute respiratory distress likely secondary to acute exacerbation of heart failure. Currently on room air, UOP 3.8L in past 24 hours.  IV Furosemide 80 mg BID ordered   3. Hypertension:158/64 Home medications include Hydralazine, resumed last night    LOS: 1   8/3/20223:03 PM

## 2021-05-25 NOTE — ED Notes (Signed)
Cardiology MD at bedside assessing pt at this time.

## 2021-05-25 NOTE — ED Notes (Signed)
Pt ambulated to BR with use of cane. Standby assist utilized for safety. Pt tolerated well.

## 2021-05-25 NOTE — Consult Note (Signed)
Cardiology Consultation:   Patient ID: [name] MRN: [id number]; DOB: Feb 23, 1941  Admit date: 05/24/2021 Date of Consult: 05/25/2021  PCP:  Lavera Guise, MD   [place]  Cardiologist: [facility], Dr. Garen Lah rounding Advanced Practice Provider:  No care team member to display Electrophysiologist:  None  Patient Profile:   [name] is a 80 y.o. female with a hx of chronic diastolic heart failure, DM2, atrial fibrillation, tobacco use, hypertension, legal blindness, pulmonary fibrosis, CKD, and who is being seen today for the evaluation of exacerbation of HFpEF at the request of Dr. Manuella Ghazi.  History of Present Illness:   [name] is an 80 year old female with PMH as above and including atrial fibrillation, diastolic heart failure, DM2, tobacco use, hypertension, and legal blindness.    She does not have a regular cardiologist.  In the past, she has been seen by [facility] clinic, though she has not seen them in the outpatient setting.    12/24/2020 echo with EF 60 to 65%, NR WMA, mild LAE, moderate calcification of the aortic valve, mild MR, mild AS, RAP 15 mmHg, and left pleural effusion.  She has been seen by the heart failure clinic with last clinic visit 02/28/2021.    She reports that she is on warfarin for her atrial fibrillation.  Her daughter reportedly manages her INR and warfarin at home.    On 05/24/2021, she presented to the emergency department per daughter request and after the daughter noted the pt had gained wt recently with report of upper epigastric bloating and discomfort, as well as SOB.  The patient's weight was increased from 125 pounds on Sunday 7/31 to 133 pounds on Tuesday 8/2.  Per patient, she felt she was holding on to volume due to shortness of breath and abdominal distention/bloating x2-3 days.  She also reported upper epigastric pain, which she stated usually occurred with this bloating and was worsened by deeper breathing.  She  denied any other exacerbating factors.  Alleviating factors were noted as decreased volume status or decreased bloating. She had a dry cough x1 week leading up to the ED and for which she took abx and steroids. COVID19 negative at that time. She denies chest pain both before and during admission.  No tachypalpitations.  No presyncope or syncope.  No signs or symptoms of bleeding. No recent falls. No orthopnea, PND, or early satiety. No LEE. No s/sx of bleeding. She reports compliance with lasix and warfarin and that her daughter continues to manage her INR/warfarin at home.  She ambulates around the home with a cane but is overall sedentary.     In the ED, BP 184/58, RR 28, 98% on room air.  Labs showed glucose 196, creatinine 1.94 and at her baseline, BUN 27, hemoglobin 9.7, hematocrit 29.3, BNP 1334.6, INR 2.3.  Chest x-ray showed increased interstitial markings compared to prior and compatible with interstitial edema, as well as a background of chronic lung disease/fibrosis.  EKG showed NSR without acute ST/T changes.  Troponin negative. Cardiology and nephrology consulted. In terms of diuresis, she has been receiving IV lasix 72m q12h since presenting to the ED. PTA lasix 466mdaily, though pt reports taking 20/40 every other day. PTA medications were otherwise restarted.  Since presenting to the ED, her breathing is back to baseline and she has less abdominal distention and associated epigastric discomfort. ON review of notes yesterday, she reported SOB and weakness with exertion yesterday.    Past Medical  History:  Diagnosis Date   Anemia    Atrial fibrillation (HCC)    Chronic kidney disease    Congestive heart failure (CHF) (HCC)    Diabetes mellitus without complication (HCC)    GERD (gastroesophageal reflux disease)    Hyperlipidemia    Hypertension    Pneumonia    Pulmonary fibrosis ([place])     Past Surgical History:  Procedure Laterality Date   ABDOMINAL HYSTERECTOMY      APPENDECTOMY     CATARACT EXTRACTION     CHOLECYSTECTOMY     HERNIA REPAIR     right knee replacement     right nephroectomy       Home Medications:  Prior to Admission medications   Medication Sig Start Date End Date Taking? Authorizing Provider  acetaminophen (TYLENOL) 325 MG tablet Take 650 mg by mouth every 6 (six) hours as needed.   Yes [provider]  atorvastatin (LIPITOR) 10 MG tablet Take 1 tablet (10 mg total) by mouth every evening. 12/06/20  Yes McDonough, Si Gaul, PA-C  citalopram (CELEXA) 40 MG tablet Take 1 tablet by mouth once daily 05/07/21  Yes Lavera Guise, MD  donepezil (ARICEPT) 5 MG tablet TAKE 1 TABLET BY MOUTH ONCE DAILY FOR MEMORY Patient taking differently: Take 5 mg by mouth daily. TAKE 1 TABLET BY MOUTH ONCE DAILY FOR MEMORY 12/06/20  Yes McDonough, Lauren K, PA-C  ferrous sulfate 325 (65 FE) MG tablet Take 1 tablet (325 mg total) by mouth daily with breakfast. 12/30/20  Yes Val Riles, MD  furosemide (LASIX) 40 MG tablet Take 1 tablet (40 mg total) by mouth daily. Patient taking differently: Take 20-40 mg by mouth every other day. 03/16/21 03/16/22 Yes Kathie Dike, MD  hydrALAZINE (APRESOLINE) 25 MG tablet Take 25 mg by mouth 3 (three) times daily as needed for high blood pressure. May take an additional tablet if BP is elevated. Hold if SBP less than 130 mmhg 03/14/21 05/24/21 Yes [provider]  linaclotide (LINZESS) 145 MCG CAPS capsule Take 1 capsule (145 mcg total) by mouth daily before breakfast. 04/19/21  Yes Lavera Guise, MD  melatonin 3 MG TABS tablet Take 3 mg by mouth at bedtime as needed (sleep).   Yes [provider]  pantoprazole (PROTONIX) 40 MG tablet Take 1 tablet (40 mg total) by mouth 2 (two) times daily. 10/20/20  Yes Lavera Guise, MD  rOPINIRole (REQUIP) 0.5 MG tablet Take 1 tablet by mouth in the evening 05/07/21  Yes Lavera Guise, MD  traMADol (ULTRAM) 50 MG tablet TAKE 1 TABLET BY MOUTH TWICE DAILY AS NEEDED  FOR  UP  TO  5  DAYS 05/24/21  Yes Lavera Guise, MD  vitamin B-12 1000 MCG tablet Take 1 tablet (1,000 mcg total) by mouth daily. 12/30/20  Yes Val Riles, MD  warfarin (COUMADIN) 2 MG tablet Take 1 tablet (2 mg total) by mouth daily. Except on wed and sat take 4 mg Patient taking differently: Take 2 mg by mouth daily. Except on Mon, wed and sat take 4 mg 05/04/21  Yes Lavera Guise, MD  azithromycin [facility]) 250 MG tablet Take 1 tablet by mouth daily for 10 days Patient not taking: Reported on 05/24/2021 05/17/21   Lavera Guise, MD  benzonatate (TESSALON) 200 MG capsule Take 1 capsule (200 mg total) by mouth 2 (two) times daily as needed for cough. Patient not taking: No sig reported 02/24/21   Allyne Gee, MD  bisacodyl (  DULCOLAX) 5 MG EC tablet Take 1 tablet (5 mg total) by mouth daily as needed for moderate constipation. Patient not taking: No sig reported 12/29/20   Val Riles, MD  Blood Pressure Monitor KIT Use three times daily to check blood pressure  DX:  I50.42 01/13/21   Lavera Guise, MD  fluticasone [facility]) 50 MCG/ACT nasal spray Place 1 spray into both nostrils daily. Patient not taking: No sig reported 02/03/21   McDonough, Si Gaul, PA-C  polyethylene glycol (MIRALAX) 17 g packet Take 17 g by mouth daily. Patient not taking: No sig reported 12/29/20   Val Riles, MD  predniSONE (DELTASONE) 10 MG tablet take one tab 3 X day for 3 days, then take one tab 2 X a day for 3 days and then take one tab a day for 3 days for COPD Patient not taking: Reported on 05/24/2021 05/17/21   Lavera Guise, MD  promethazine (PHENERGAN) 12.5 MG tablet Take 1 tablet (12.5 mg total) by mouth every 8 (eight) hours as needed for nausea or vomiting. Patient not taking: No sig reported 01/24/21   Mylinda Latina, PA-C  warfarin (COUMADIN) 2 MG tablet Take 1 tablet (2 mg total) by mouth daily. Patient not taking: No sig reported 12/06/20   Mylinda Latina, PA-C    Inpatient Medications: Scheduled  Meds:  aspirin EC  81 mg Oral Daily   atorvastatin  10 mg Oral QPM   citalopram  40 mg Oral Daily   donepezil  5 mg Oral Daily   ferrous sulfate  325 mg Oral Q breakfast   furosemide  80 mg Intravenous Q12H   hydrALAZINE  25 mg Oral Q8H   linaclotide  145 mcg Oral QAC breakfast   pantoprazole  40 mg Oral BID   rOPINIRole  0.5 mg Oral QPM   cyanocobalamin  1,000 mcg Oral Daily   warfarin  2 mg Oral Once per day on Sun Tue Thu Fri   And   warfarin  4 mg Oral Once per day on Mon Wed Sat   Continuous Infusions:  PRN Meds: acetaminophen (TYLENOL) oral liquid 160 mg/5 mL, albuterol, dextromethorphan-guaiFENesin, hydrALAZINE, melatonin, nitroGLYCERIN, ondansetron (ZOFRAN) IV, traMADol  Allergies:   No Known Allergies  Social History:   Social History   Socioeconomic History   Marital status: Widowed    Spouse name: Not on file   Number of children: Not on file   Years of education: Not on file   Highest education level: Not on file  Occupational History   Occupation: retired  Tobacco Use   Smoking status: Former    Packs/day: 1.00    Types: Cigarettes    Quit date: 02/10/2021    Years since quitting: 0.2   Smokeless tobacco: Never  Vaping Use   Vaping Use: Never used  Substance and Sexual Activity   Alcohol use: No   Drug use: No   Sexual activity: Not Currently  Other Topics Concern   Not on file  Social History Narrative   Lives with daughter   Social Determinants of Health   Financial Resource Strain: Not on file  Food Insecurity: Not on file  Transportation Needs: Not on file  Physical Activity: Not on file  Stress: Not on file  Social Connections: Not on file  Intimate Partner Violence: Not on file    Family History:    Family History  Problem Relation Age of Onset   Diabetes Mother    Breast cancer Mother  Heart disease Father    Diabetes Father    Diabetes Sister    Diabetes Brother    Cancer Brother      ROS:  Please see the history of  present illness.  ROS as reported above. She reports abdominal distention with associated upper epigastric discomfort and shortness of breath. All other ROS reviewed and negative.     Physical Exam/Data:   Vitals:   05/25/21 0730 05/25/21 0800 05/25/21 0900 05/25/21 0930  BP: (!) 150/39 (!) 155/45 136/72 133/78  Pulse: 74  72 76  Resp: '19  16 12  ' Temp: 98.1 F (36.7 C)   98.4 F (36.9 C)  TempSrc: Oral   Oral  SpO2: 100%  100% 100%  Weight:      Height:        Intake/Output Summary (Last 24 hours) at 05/25/2021 1125 Last data filed at 05/25/2021 3299 Gross per 24 hour  Intake --  Output 2000 ml  Net -2000 ml   Last 3 Weights 05/25/2021 04/19/2021 03/28/2021  Weight (lbs) 121 lb 4.1 oz 122 lb 116 lb 6.4 oz  Weight (kg) 55 kg 55.339 kg 52.799 kg     Body mass index is 25.34 kg/m.  General: Frail and elderly female, no acute distress HEENT: normal Lymph: no adenopathy Neck: no JVD Endocrine:  No thryomegaly Vascular: No carotid bruits; FA pulses 2+ bilaterally without bruits  Cardiac:  normal S1, S2; RRR; 1/6 systolic murmur Lungs:  clear to auscultation bilaterally, no wheezing, rhonchi or rales  Abd: soft, nontender, no hepatomegaly  Ext: no edema Musculoskeletal:  No deformities, BUE and BLE strength normal and equal Skin: warm and dry  Neuro:  CNs 2-12 intact, no focal abnormalities noted Psych:  Normal affect   EKG:  The EKG was personally reviewed and demonstrates:  NSR, 60bpm, poor R wave progression lead VI Telemetry:  Telemetry was personally reviewed and demonstrates:  NSR, PACs, brief episodes of earlier Afib with rate well controlled at 80s - 90s. Currently NSR and rates in the 80s  Relevant CV Studies: Echo 12/24/20  1. Left ventricular ejection fraction, by estimation, is 60 to 65%. The  left ventricle has normal function. The left ventricle has no regional  wall motion abnormalities. Left ventricular diastolic parameters are  indeterminate.   2. Right  ventricular systolic function is normal. The right ventricular  size is normal. There is normal pulmonary artery systolic pressure.   3. Left atrial size was mildly dilated.   4. There is moderate calcification of the aortic valve. Aortic valve  regurgitation is mild. Mild aortic valve stenosis.   5. The inferior vena cava is dilated in size with <50% respiratory  variability, suggesting right atrial pressure of 15 mmHg.   6. Left pleural effusion noted.   Laboratory Data:  High Sensitivity Troponin:   Recent Labs  Lab 05/24/21 0455 05/24/21 0742  TROPONINIHS 15 16     Chemistry Recent Labs  Lab 05/24/21 0455 05/25/21 0506  NA 136 134*  K 3.8 3.9  CL 104 96*  CO2 25 30  GLUCOSE 196* 160*  BUN 47* 56*  CREATININE 1.94* 2.13*  CALCIUM 8.7* 8.9  GFRNONAA 26* 23*  ANIONGAP 7 8    No results for input(s): PROT, ALBUMIN, AST, ALT, ALKPHOS, BILITOT in the last 168 hours. Hematology Recent Labs  Lab 05/24/21 0455  WBC 8.2  RBC 3.15*  HGB 9.7*  HCT 29.3*  MCV 93.0  MCH 30.8  MCHC 33.1  RDW  14.3  PLT 245   BNP Recent Labs  Lab 05/24/21 0455  BNP 1,334.6*    DDimer No results for input(s): DDIMER in the last 168 hours.   Radiology/Studies:  CT ABDOMEN PELVIS WO CONTRAST  Result Date: 05/24/2021 CLINICAL DATA:  Acute onset abdominal pain. EXAM: CT ABDOMEN AND PELVIS WITHOUT CONTRAST TECHNIQUE: Multidetector CT imaging of the abdomen and pelvis was performed following the standard protocol without IV contrast. COMPARISON:  CT abdomen and pelvis 10/27/2019. FINDINGS: Lower chest: Mild cardiomegaly. No pericardial effusion. Small bilateral pleural effusions are present, larger on the right. Interstitial coarsening and mild dependent atelectasis noted. Hepatobiliary: No focal liver abnormality is seen. Status post cholecystectomy. No biliary dilatation. Pancreas: Unremarkable. No pancreatic ductal dilatation or surrounding inflammatory changes. Spleen: Normal in size  without focal abnormality. Adrenals/Urinary Tract: The adrenal glands appear normal. The patient is status post right nephrectomy. Left kidney and ureter are negative. Urinary bladder is unremarkable. Stomach/Bowel: Stomach is within normal limits. Status post appendectomy. No evidence of bowel wall thickening, distention, or inflammatory changes. Vascular/Lymphatic: Aortic atherosclerosis. No enlarged abdominal or pelvic lymph nodes. Reproductive: Uterus and bilateral adnexa are unremarkable. Other: Dependent at edema is seen in subcutaneous fatty tissues. No ascites. No hernia. Musculoskeletal: No acute or focal abnormality. IMPRESSION: No acute abnormality within the abdomen or pelvis. Small bilateral pleural effusions and dependent subcutaneous edema suggestive of volume overload. Mild cardiomegaly. Aortic Atherosclerosis (ICD10-I70.0). Electronically Signed   By: Inge Rise M.D.   On: 05/24/2021 12:57   DG Chest 2 View  Result Date: 05/24/2021 CLINICAL DATA:  Cough.  Weight gain EXAM: CHEST - 2 VIEW COMPARISON:  03/15/2021 FINDINGS: Diffuse interstitial coarsening above prior baseline. Mild cardiomegaly. Stable upper mediastinal contours. No significant effusion or focal consolidation. IMPRESSION: Increased interstitial markings compared to prior and compatible with interstitial edema. There is a background of chronic lung disease/fibrosis by prior CTs. Electronically Signed   By: Monte Fantasia M.D.   On: 05/24/2021 05:39     Assessment and Plan:   Acute on chronic diastolic heart failure --Reports improved SOB and abdominal distention and associated discomfort. SOB likely multifactorial in the setting of known lung dz, AOC HFpEF, PAF, as well as deconditioning. Consider that PAF could be exacerbating and exacerbated by volume status. Low suspicion for SOB 2/2 PE given warfarin. At presentation, CT showed small bilateral pleural effusions. CXR compatible with interstitial edema. BNP elevated,  though with consideration of known lung dz.  She was started on IV lasix with improvement in sx. Will order an echo to reassess EF, WM, and valvular function and rule out acute structural changes. Previous echo with EF 60-65% and NRWMA, RAP 73mHg, mild AR/AS, and L pleural effusion. For now, continue diuresis per nephrology recommendations given her CKD. Net -3.8L yesterday. 8/3 wt 55kg (similar to that in June). Continue to monitor I/Os, daily standing wt. Daily BMET. Further recommendations if needed after echo. Suspect she will need higher dose lasix or transition to torsemide at home and to be determined before discharge.    Hypertension --BP improved from presentation with systolic now in the 1470J Continue current medications. Addition of ACE/ARB per nephrology if Cr allows given comorbid DM2 and HTN.   CKD --Daily BMET. Monitor Cr, electrolytes. Nephrology also following the pt.   Anemia of chronic dz --Hgb 9.7. H&H stable when compared with previous. Daily CBC. No report of bleeding.  PAF -- NSR currently on telemetry with earlier episodes of NSR with PACs and episodes of  Afib. Rate well controlled with pt not on any rate controlling agents.  Continue warfarin/INR monitoring.  Most recent INR 2.1.  HLD -- LDL 35 and at goal.  ALT from 02/2021 WNL.  Continue statin.  DM2 --A1c 6.6.  Per IM.  SSI.   For questions or updates, please contact [place] Please consult [email or web address] for contact info under    Signed, Arvil Chaco, PA-C  05/25/2021 11:25 AM

## 2021-05-25 NOTE — ED Notes (Signed)
Replaced pt's purewick, got pt warm blanket and moved call bell up to an easier spot for her to reach. No other needs at this time.

## 2021-05-25 NOTE — ED Notes (Signed)
Pt HR noted to be alarming 120-140 on bedside ED cardiac monitor. This RN to pt room, pt is sitting up eating lunch in no apparent distress. Pt assisted in laying down in bed. MD notified. Will obtain EKG.

## 2021-05-25 NOTE — ED Notes (Signed)
EKG showing pt be be in Afib RVR. Per MD Manuella Ghazi, continue with patient transfer to floor.

## 2021-05-25 NOTE — Progress Notes (Signed)
ANTICOAGULATION CONSULT NOTE - Initial Consult  Pharmacy Consult for Warfarin management Indication: atrial fibrillation  No Known Allergies  Vital Signs: Temp: 98.4 F (36.9 C) (08/03 0930) Temp Source: Oral (08/03 0930) BP: 133/78 (08/03 0930) Pulse Rate: 76 (08/03 0930)  Labs: Recent Labs    05/24/21 0455 05/24/21 0742 05/25/21 0506  HGB 9.7*  --   --   HCT 29.3*  --   --   PLT 245  --   --   LABPROT 25.3*  --  23.6*  INR 2.3*  --  2.1*  CREATININE 1.94*  --  2.13*  TROPONINIHS 15 16  --     Medical History: Past Medical History:  Diagnosis Date   Anemia    Atrial fibrillation (HCC)    Chronic kidney disease    Congestive heart failure (CHF) (HCC)    Diabetes mellitus without complication (HCC)    GERD (gastroesophageal reflux disease)    Hyperlipidemia    Hypertension    Pneumonia    Pulmonary fibrosis (HCC)     Medications:  Scheduled:   aspirin EC  81 mg Oral Daily   atorvastatin  10 mg Oral QPM   citalopram  40 mg Oral Daily   donepezil  5 mg Oral Daily   ferrous sulfate  325 mg Oral Q breakfast   furosemide  80 mg Intravenous Q12H   hydrALAZINE  25 mg Oral Q8H   linaclotide  145 mcg Oral QAC breakfast   pantoprazole  40 mg Oral BID   rOPINIRole  0.5 mg Oral QPM   cyanocobalamin  1,000 mcg Oral Daily   warfarin  2 mg Oral Once per day on Sun Tue Thu Fri   And   warfarin  4 mg Oral Once per day on Mon Wed Sat    Assessment: Patient anticoagulation stable. Admitted with chest pain and shortness of breath. PMH includes HFpEF, ATRIAL FIBRILLATION, CKD, HTN, HLP, and pulmonary fibrosis.  Renal function and Hgb stable. INR at 2.1 today.  Goal of Therapy:  INR 2-3    Plan:  Continue stable home dose of 2mg  daily except for 4mg  every Monday, Wednesday and Saturday. Check INR with morning blood work.  Adelae Yodice Rodriguez-Guzman PharmD, BCPS 05/25/2021 11:15 AM

## 2021-05-25 NOTE — ED Notes (Signed)
Informed RN bed assigned 

## 2021-05-25 NOTE — ED Notes (Signed)
Pt transported to room via stretcher on portable cardiac monitor. Pt continues to deny CP, SOB.

## 2021-05-25 NOTE — ED Notes (Signed)
Lunch tray delivered.

## 2021-05-25 NOTE — Progress Notes (Signed)
Patient arrived on the unit from the ED.  HR elevated in the 130-140, in A. Fib.  Patient denies any CP, SOB, or palpitations.    Vital signs WNL.    Notified MD.

## 2021-05-25 NOTE — Progress Notes (Signed)
   05/25/21 1524  Assess: MEWS Score  Temp 97.9 F (36.6 C)  BP 102/77  Pulse Rate (!) 125  Resp 17  SpO2 100 %  O2 Device Room Air  Assess: MEWS Score  MEWS Temp 0  MEWS Systolic 0  MEWS Pulse 2  MEWS RR 0  MEWS LOC 0  MEWS Score 2  MEWS Score Color Yellow  Assess: if the MEWS score is Yellow or Red  Were vital signs taken at a resting state? Yes  Focused Assessment No change from prior assessment  Does the patient meet 2 or more of the SIRS criteria? No  MEWS guidelines implemented *See Row Information* Yes  Treat  Pain Scale 0-10  Pain Score 0  Take Vital Signs  Increase Vital Sign Frequency  Yellow: Q 2hr X 2 then Q 4hr X 2, if remains yellow, continue Q 4hrs  Escalate  MEWS: Escalate Yellow: discuss with charge nurse/RN and consider discussing with provider and RRT  Notify: Charge Nurse/RN  Name of Charge Nurse/RN Notified Ria Comment RN  Date Charge Nurse/RN Notified 05/25/21  Time Charge Nurse/RN Notified 1600  Notify: Provider  Provider Name/Title Dr. Manuella Ghazi  Date Provider Notified 05/25/21  Time Provider Notified 1630  Notification Type Page (secure chat- patient asymptomatic)  Notification Reason Other (Comment) (A.fib RVR - MEWs 2 (yellow))  Provider response See new orders  Date of Provider Response 05/25/21  Time of Provider Response 1650  Document  Patient Outcome Not stable and remains on department  Progress note created (see row info) Yes  Assess: SIRS CRITERIA  SIRS Temperature  0  SIRS Pulse 1  SIRS Respirations  0  SIRS WBC 0  SIRS Score Sum  1   Patient arrived on the unit from ED-  Heart elevated sustaining around 130's-140's, jumping up into the 150's.  Denies chest pain, sob, and palpitations.   Encouraged patient to let us know right away if starts to become symptomatic.     Paged MD and made charge nurse aware MD added metoprolol for HR.  Will continue to monitor BP

## 2021-05-25 NOTE — ED Notes (Signed)
Messaged inpatient RN assigned to pt X 2 to see if she is ready for pt. Awaiting response.

## 2021-05-25 NOTE — Progress Notes (Signed)
[facility] NAME: [name]    MR#:  [id number]  PCP: Lavera Guise, MD  DATE OF BIRTH:  16-Feb-1941  SUBJECTIVE:  CHIEF COMPLAINT:   Chief Complaint  Patient presents with   Chest Pain   Shortness of Breath  Reports weight gain and some abdominal distention.  Also reports shortness of breath REVIEW OF SYSTEMS:  Review of Systems  Constitutional:  Negative for diaphoresis, fever, malaise/fatigue and weight loss.  HENT:  Negative for ear discharge, ear pain, hearing loss, nosebleeds, sore throat and tinnitus.   Eyes:  Negative for blurred vision and pain.  Respiratory:  Positive for shortness of breath. Negative for cough, hemoptysis and wheezing.   Cardiovascular:  Negative for chest pain, palpitations, orthopnea and leg swelling.  Gastrointestinal:  Negative for abdominal pain, blood in stool, constipation, diarrhea, heartburn, nausea and vomiting.  Genitourinary:  Negative for dysuria, frequency and urgency.  Musculoskeletal:  Negative for back pain and myalgias.  Skin:  Negative for itching and rash.  Neurological:  Negative for dizziness, tingling, tremors, focal weakness, seizures, weakness and headaches.  Psychiatric/Behavioral:  Negative for depression. The patient is not nervous/anxious.   DRUG ALLERGIES:  No Known Allergies VITALS:  Blood pressure (!) 158/64, pulse 78, temperature 98.4 F (36.9 C), temperature source Oral, resp. rate 17, height 4\' 10"  (1.473 m), weight 55 kg, SpO2 100 %. PHYSICAL EXAMINATION:  Physical Exam 80 year old frail looking female in no acute distress Eyes pupil equal round reactive to light and accommodation.  No scleral icterus Lungs decreased breath sounds at the bases.  No wheezing rales rhonchi crepitation Cardiovascular 2/6 systolic ejection murmur.  Normal S1-S2 Abdomen soft, benign Extremities no pedal edema cyanosis or clubbing Neuro alert and oriented, nonfocal Skin no rash or  lesion LABORATORY PANEL:  Female CBC Recent Labs  Lab 05/24/21 0455  WBC 8.2  HGB 9.7*  HCT 29.3*  PLT 245   ------------------------------------------------------------------------------------------------------------------ Chemistries  Recent Labs  Lab 05/25/21 0506  NA 134*  K 3.9  CL 96*  CO2 30  GLUCOSE 160*  BUN 56*  CREATININE 2.13*  CALCIUM 8.9  MG 2.1   MEDICATIONS:  Scheduled Meds:  aspirin EC  81 mg Oral Daily   atorvastatin  10 mg Oral QPM   citalopram  40 mg Oral Daily   donepezil  5 mg Oral Daily   ferrous sulfate  325 mg Oral Q breakfast   furosemide  40 mg Intravenous Q12H   hydrALAZINE  25 mg Oral Q8H   linaclotide  145 mcg Oral QAC breakfast   pantoprazole  40 mg Oral BID   rOPINIRole  0.5 mg Oral QPM   cyanocobalamin  1,000 mcg Oral Daily   warfarin  2 mg Oral Once per day on Sun Tue Thu Fri   And   warfarin  4 mg Oral Once per day on Mon Wed Sat   Continuous Infusions: RADIOLOGY:  No results found. ASSESSMENT AND PLAN:  80 y.o. female with medical history significant of HTN, HLD, DM, pulmonary fibrosis, dCHF, s/p of right nephrectomy, CKD stage IV, paroxysmal atrial fibrillation on Coumadin, anemia of chronic kidney disease, colitis, GERD, depression, dementia, legally blindness, admitted for worsening shortness breath and weight gain  Principal Problem:   Acute on chronic diastolic congestive heart failure (HCC) Active Problems:   GERD (gastroesophageal reflux disease)   Atrial fibrillation ([place])   CKD stage 4 due to type 2 diabetes  mellitus ([place])   Essential hypertension   Anemia associated with stage 4 chronic renal failure (HCC)   Type II diabetes mellitus with renal manifestations (HCC)   HLD (hyperlipidemia)   Depression   Legally blind   Abdominal pain  Acute on chronic diastolic congestive heart failure ([place]): 2D echo on 12/22/2020 showed EF of 60 to 65%.  Patient does not have leg edema, but has elevated BNP 1334 on admission,  interstitial pulmonary edema on chest x-ray,No oxygen desaturation on room air at rest or on ambulation. -Monitor on progressive unit as inpatient -Reduce Lasix 80 mg bid IV to 40 mg IV twice daily considering her underlying CKD stage IV Net IO Since Admission: -3,800 mL [05/25/21 1528]  -2d echo pending this admission -Daily weights.  Daughter reports 10 pound weight gain at home -strict I/O's -Low salt diet -Fluid restriction -REDs Vest reading once on PCU   Abdominal pain: Patient reported epigastric abdominal pain on admission-now resolved -Normal lipase -CT scan of abdomen/pelvis without contrast within normal limits   GERD (gastroesophageal reflux disease) -Continue Protonix   Paroxysmal atrial fibrillation ([place]): Heart rate 70-90s in normal sinus rhythm -Continue Coumadin.  INR 2.1 -Mediate rate controlling agent if heart rate goes up   CKD stage 4 due to type 2 diabetes mellitus ([place]): Recent creatinine 2.33 on 03/16/2021.  Her creatinine is 2.13, stable -Monitor renal function closely by BMP -Dr. Juleen China of nephrology is following   Essential hypertension: Blood pressure 205/56, 175/40 on admission patient is taking oral hydralazine 25 mg 3 times daily as needed at home -IV hydralazine as needed -Continue hydralazine to 25 mg 3 times daily.  Blood pressure much improved now.   Anemia associated with stage 4 chronic renal failure [facility]): Hemoglobin stable, 9.7 (9.1 on 03/16/2022 -Follow-up with CBC -Continue iron supplement   Diet controlled Type II diabetes mellitus with renal manifestations [facility]): Recent A1c 6.6, well controlled.  Patient not taking medications currently.   -Check CBG every morning   HLD (hyperlipidemia) -Lipitor   Depression: -Celexa   Legally blind -Fall precaution   Moderate protein calorie malnutrition Body mass index is 25.34 kg/m. She has muscle wasting  Net IO Since Admission: -3,800 mL [05/25/21 1525]      LOS: 1 day    Consultants: Cardiology/[facility]   Antibiotics: None  Status is: Inpatient  Remains inpatient appropriate because:Hemodynamically unstable  Dispo: The patient is from: Home              Anticipated d/c is to: Home              Patient currently is not medically stable to d/c.  Will need ongoing diuresis and heart rate control/A. fib before discharge back    Difficult to place patient No   DVT prophylaxis:        warfarin (COUMADIN) tablet 2 mg  warfarin (COUMADIN) tablet 4 mg     Family Communication: Updated patient's daughter over phone on 8/3   All the records are reviewed and case discussed with Nursing and TOC team. Management plans discussed with the patient, family and they are in agreement.  CODE STATUS: DNR Level of care: Progressive Cardiac  TOTAL TIME TAKING CARE OF THIS PATIENT: 35 minutes.   More than 50% of the time was spent in counseling/coordination of care: YES  POSSIBLE D/C IN 1-2 DAYS, DEPENDING ON CLINICAL CONDITION.   Max Sane M.D on 05/25/2021 at 3:25 PM  [facility]   CC: Primary care physician;  Lavera Guise, MD  Note: This dictation was prepared with Dragon dictation along with smaller phrase technology. Any transcriptional errors that result from this process are unintentional.

## 2021-05-26 ENCOUNTER — Inpatient Hospital Stay (HOSPITAL_COMMUNITY)
Admit: 2021-05-26 | Discharge: 2021-05-26 | Disposition: A | Discharge status: Home / Self Care | Payer: Medicare HMO | Attending: Physician Assistant | Admitting: Physician Assistant

## 2021-05-26 DIAGNOSIS — I5033 Acute on chronic diastolic (congestive) heart failure: Secondary | ICD-10-CM | POA: Diagnosis not present

## 2021-05-26 DIAGNOSIS — I350 Nonrheumatic aortic (valve) stenosis: Secondary | ICD-10-CM | POA: Diagnosis not present

## 2021-05-26 DIAGNOSIS — I351 Nonrheumatic aortic (valve) insufficiency: Secondary | ICD-10-CM | POA: Diagnosis not present

## 2021-05-26 DIAGNOSIS — N184 Chronic kidney disease, stage 4 (severe): Secondary | ICD-10-CM

## 2021-05-26 DIAGNOSIS — D631 Anemia in chronic kidney disease: Secondary | ICD-10-CM

## 2021-05-26 DIAGNOSIS — E1122 Type 2 diabetes mellitus with diabetic chronic kidney disease: Secondary | ICD-10-CM | POA: Diagnosis not present

## 2021-05-26 DIAGNOSIS — I480 Paroxysmal atrial fibrillation: Secondary | ICD-10-CM | POA: Diagnosis not present

## 2021-05-26 LAB — ECHOCARDIOGRAM COMPLETE
AR max vel: 1.19 cm2
AV Area VTI: 1.15 cm2
AV Area mean vel: 1.23 cm2
AV Mean grad: 7.7 mmHg
AV Peak grad: 14.1 mmHg
Ao pk vel: 1.88 m/s
Area-P 1/2: 6.47 cm2
Height: 58 in
P 1/2 time: 404 msec
S' Lateral: 3.1 cm
Weight: 1763.68 oz

## 2021-05-26 LAB — CBC
HCT: 35.6 % — ABNORMAL LOW (ref 36.0–46.0)
Hemoglobin: 12.2 g/dL (ref 12.0–15.0)
MCH: 31.5 pg (ref 26.0–34.0)
MCHC: 34.3 g/dL (ref 30.0–36.0)
MCV: 92 fL (ref 80.0–100.0)
Platelets: 259 10*3/uL (ref 150–400)
RBC: 3.87 MIL/uL (ref 3.87–5.11)
RDW: 14.9 % (ref 11.5–15.5)
WBC: 13.8 10*3/uL — ABNORMAL HIGH (ref 4.0–10.5)
nRBC: 0 % (ref 0.0–0.2)

## 2021-05-26 LAB — BASIC METABOLIC PANEL
Anion gap: 10 (ref 5–15)
BUN: 64 mg/dL — ABNORMAL HIGH (ref 8–23)
CO2: 32 mmol/L (ref 22–32)
Calcium: 8.7 mg/dL — ABNORMAL LOW (ref 8.9–10.3)
Chloride: 93 mmol/L — ABNORMAL LOW (ref 98–111)
Creatinine, Ser: 2.44 mg/dL — ABNORMAL HIGH (ref 0.44–1.00)
GFR, Estimated: 20 mL/min — ABNORMAL LOW (ref >60)
Glucose, Bld: 185 mg/dL — ABNORMAL HIGH (ref 70–99)
Potassium: 3.9 mmol/L (ref 3.5–5.1)
Sodium: 135 mmol/L (ref 135–145)

## 2021-05-26 LAB — PROTIME-INR
INR: 1.9 — ABNORMAL HIGH (ref 0.8–1.2)
Prothrombin Time: 21.9 seconds — ABNORMAL HIGH (ref 11.4–15.2)

## 2021-05-26 LAB — GLUCOSE, CAPILLARY: Glucose-Capillary: 201 mg/dL — ABNORMAL HIGH (ref 70–99)

## 2021-05-26 LAB — TSH: TSH: 3.353 u[IU]/mL (ref 0.350–4.500)

## 2021-05-26 MED ORDER — METOPROLOL TARTRATE 25 MG PO TABS: 25.0000 mg | ORAL_TABLET | Freq: Two times a day (BID) | ORAL | Status: DC

## 2021-05-26 MED ORDER — METOPROLOL TARTRATE 50 MG PO TABS: 50.0000 mg | ORAL_TABLET | Freq: Two times a day (BID) | ORAL | Status: DC

## 2021-05-26 MED ORDER — FUROSEMIDE 10 MG/ML IJ SOLN
40.0000 mg | Freq: Two times a day (BID) | INTRAMUSCULAR | Status: DC
  Administered 2021-05-26 (×2): 40 mg via INTRAVENOUS
  Filled 2021-05-26 (×2): qty 4

## 2021-05-26 MED ORDER — WARFARIN SODIUM 4 MG PO TABS
4.0000 mg | ORAL_TABLET | Freq: Once | ORAL | Status: AC
  Administered 2021-05-26: 4 mg via ORAL
  Filled 2021-05-26: qty 1

## 2021-05-26 MED ORDER — METOPROLOL TARTRATE 25 MG PO TABS
25.0000 mg | ORAL_TABLET | Freq: Four times a day (QID) | ORAL | Status: DC
  Administered 2021-05-26 – 2021-05-27 (×4): 25 mg via ORAL
  Filled 2021-05-26 (×4): qty 1

## 2021-05-26 MED ORDER — WARFARIN - PHARMACIST DOSING INPATIENT: Freq: Every day | Status: DC

## 2021-05-26 NOTE — Progress Notes (Signed)
[facility]  ROUNDING NOTE   Subjective:   [name]  is a 80 y.o. female with past medical history of anemia, Atrial Fib, CHD, CHF diabetes, and hypertension. Patient reports having trouble breathing and having excess fluid. Patient will be admitted for Acute on chronic diastolic congestive heart failure (HCC) [I50.33] Acute on chronic congestive heart failure, unspecified heart failure type [facility]) [I50.9]  Patient is known to our practice and currently sees Dr Juleen China.   Patient seen siting at side of bed Preparing to eat breakfast States she feels much better Currently on room air  Objective:  Vital signs in last 24 hours:  Temp:  [97.9 F (36.6 C)-98.6 F (37 C)] 98.3 F (36.8 C) (08/04 1117) Pulse Rate:  [78-138] 104 (08/04 1117) Resp:  [13-25] 20 (08/04 1117) BP: (102-158)/(44-89) 103/67 (08/04 1117) SpO2:  [97 %-100 %] 98 % (08/04 1117) Weight:  [50 kg] 50 kg (08/04 0440)  Weight change: -5 kg Filed Weights   05/25/21 0427 05/26/21 0440  Weight: 55 kg 50 kg    Intake/Output: I/O last 3 completed shifts: In: 510 [P.O.:510] Out: 2300 [Urine:2300]   Intake/Output this shift:  Total I/O In: 480 [P.O.:480] Out: 600 [Urine:600]  Physical Exam: General: NAD, sitting bedside  Head: Normocephalic, atraumatic. Moist oral mucosal membranes  Eyes: Anicteric  Lungs:  Clear to auscultation, normal effort  Heart: Regular rate and rhythm  Abdomen:  Soft, nontender  Extremities:  no peripheral edema.  Neurologic: Nonfocal, moving all four extremities  Skin: No lesions       Basic Metabolic Panel: Recent Labs  Lab 05/24/21 0455 05/25/21 0506 05/26/21 0448  NA 136 134* 135  K 3.8 3.9 3.9  CL 104 96* 93*  CO2 25 30 32  GLUCOSE 196* 160* 185*  BUN 47* 56* 64*  CREATININE 1.94* 2.13* 2.44*  CALCIUM 8.7* 8.9 8.7*  MG  --  2.1  --      Liver Function Tests: No results for input(s): AST, ALT, ALKPHOS, BILITOT, PROT, ALBUMIN in the last 168  hours. Recent Labs  Lab 05/24/21 0742  LIPASE 32    No results for input(s): AMMONIA in the last 168 hours.  CBC: Recent Labs  Lab 05/24/21 0455 05/26/21 0448  WBC 8.2 13.8*  HGB 9.7* 12.2  HCT 29.3* 35.6*  MCV 93.0 92.0  PLT 245 259     Cardiac Enzymes: No results for input(s): CKTOTAL, CKMB, CKMBINDEX, TROPONINI in the last 168 hours.  BNP: Invalid input(s): POCBNP  CBG: Recent Labs  Lab 05/26/21 0757  GLUCAP 201*    Microbiology: Results for orders placed or performed during the hospital encounter of 05/24/21  Resp Panel by RT-PCR (Flu A&B, Covid) Nasopharyngeal Swab     Status: None   Collection Time: 05/24/21  5:44 AM   Specimen: Nasopharyngeal Swab; Nasopharyngeal(NP) swabs in vial transport medium  Result Value Ref Range Status   SARS Coronavirus 2 by RT PCR NEGATIVE NEGATIVE Final    Comment: (NOTE) SARS-CoV-2 target nucleic acids are NOT DETECTED.  The SARS-CoV-2 RNA is generally detectable in upper respiratory specimens during the acute phase of infection. The lowest concentration of SARS-CoV-2 viral copies this assay can detect is 138 copies/mL. A negative result does not preclude SARS-Cov-2 infection and should not be used as the sole basis for treatment or other patient management decisions. A negative result may occur with  improper specimen collection/handling, submission of specimen other than nasopharyngeal swab, presence of viral mutation(s) within the  areas targeted by this assay, and inadequate number of viral copies(<138 copies/mL). A negative result must be combined with clinical observations, patient history, and epidemiological information. The expected result is Negative.  Fact Sheet for Patients:  EntrepreneurPulse.com.au  Fact Sheet for Healthcare Providers:  IncredibleEmployment.be  This test is no t yet approved or cleared by the [place] FDA and  has been authorized for detection  and/or diagnosis of SARS-CoV-2 by FDA under an Emergency Use Authorization (EUA). This EUA will remain  in effect (meaning this test can be used) for the duration of the COVID-19 declaration under Section 564(b)(1) of the Act, 21 U.S.C.section 360bbb-3(b)(1), unless the authorization is terminated  or revoked sooner.       Influenza A by PCR NEGATIVE NEGATIVE Final   Influenza B by PCR NEGATIVE NEGATIVE Final    Comment: (NOTE) The Xpert Xpress SARS-CoV-2/FLU/RSV plus assay is intended as an aid in the diagnosis of influenza from Nasopharyngeal swab specimens and should not be used as a sole basis for treatment. Nasal washings and aspirates are unacceptable for Xpert Xpress SARS-CoV-2/FLU/RSV testing.  Fact Sheet for Patients: EntrepreneurPulse.com.au  Fact Sheet for Healthcare Providers: IncredibleEmployment.be  This test is not yet approved or cleared by the [place] FDA and has been authorized for detection and/or diagnosis of SARS-CoV-2 by FDA under an Emergency Use Authorization (EUA). This EUA will remain in effect (meaning this test can be used) for the duration of the COVID-19 declaration under Section 564(b)(1) of the Act, 21 U.S.C. section 360bbb-3(b)(1), unless the authorization is terminated or revoked.  Performed at [facility], [address] [place], [place] [address]     Coagulation Studies: Recent Labs    05/24/21 0455 05/25/21 0506 05/26/21 0742  LABPROT 25.3* 23.6* 21.9*  INR 2.3* 2.1* 1.9*     Urinalysis: No results for input(s): COLORURINE, LABSPEC, PHURINE, GLUCOSEU, HGBUR, BILIRUBINUR, KETONESUR, PROTEINUR, UROBILINOGEN, NITRITE, LEUKOCYTESUR in the last 72 hours.  Invalid input(s): APPERANCEUR    Imaging: No results found.   Medications:     atorvastatin  10 mg Oral QPM   citalopram  40 mg Oral Daily   donepezil  5 mg Oral Daily   ferrous sulfate  325 mg Oral Q breakfast    furosemide  40 mg Intravenous Q12H   linaclotide  145 mcg Oral QAC breakfast   metoprolol tartrate  25 mg Oral Q6H   pantoprazole  40 mg Oral BID   rOPINIRole  0.5 mg Oral QPM   cyanocobalamin  1,000 mcg Oral Daily   warfarin  4 mg Oral ONCE-1600   Warfarin - Pharmacist Dosing Inpatient   Does not apply q1600   acetaminophen (TYLENOL) oral liquid 160 mg/5 mL, albuterol, dextromethorphan-guaiFENesin, hydrALAZINE, melatonin, nitroGLYCERIN, ondansetron (ZOFRAN) IV, traMADol  Assessment/ Plan:  [name] is a 80 y.o.  female with past medical history of anemia, Atrial Fib, CHD, CHF diabetes, and hypertension. Patient reports having trouble breathing and having excess fluid. Patient will be admitted for Acute on chronic diastolic congestive heart failure (HCC) [I50.33] Acute on chronic congestive heart failure, unspecified heart failure type ([place]) [I50.9]   Chronic kidney disease stage 4 with creatinine better than baseline, IV Lasix 40mg  BID Creatinine elevated with diuretic use Will continue to monitor labs  Will need office follow up after discharge.     Lab Results  Component Value Date   CREATININE 2.44 (H) 05/26/2021   CREATININE 2.13 (H) 05/25/2021   CREATININE 1.94 (H) 05/24/2021  Intake/Output Summary (Last 24 hours) at 05/26/2021 1238 Last data filed at 05/26/2021 1234 Gross per 24 hour  Intake 990 ml  Output 1900 ml  Net -910 ml    2. Acute respiratory distress likely secondary to acute exacerbation of heart failure. Room air, UOP 1.3L in past 24 hours.  IV Furosemide 40 mg BID    3. Hypertension:103/67 Home medications include Hydralazine, resumed    LOS: 2   8/4/202212:38 PM

## 2021-05-26 NOTE — Progress Notes (Signed)
   05/26/21 0811  Assess: MEWS Score  Temp 98 F (36.7 C)  BP 115/65  Pulse Rate (!) 115  Resp 20  SpO2 98 %  O2 Device Room Air  Assess: MEWS Score  MEWS Temp 0  MEWS Systolic 0  MEWS Pulse 2  MEWS RR 0  MEWS LOC 0  MEWS Score 2  MEWS Score Color Yellow  Assess: if the MEWS score is Yellow or Red  Were vital signs taken at a resting state? Yes  Focused Assessment Change from prior assessment (see assessment flowsheet)  Does the patient meet 2 or more of the SIRS criteria? No  MEWS guidelines implemented *See Row Information* Yes  Treat  MEWS Interventions Escalated (See documentation below)  Take Vital Signs  Increase Vital Sign Frequency  Yellow: Q 2hr X 2 then Q 4hr X 2, if remains yellow, continue Q 4hrs  Escalate  MEWS: Escalate Yellow: discuss with charge nurse/RN and consider discussing with provider and RRT  Notify: Charge Nurse/RN  Name of Charge Nurse/RN Notified stephanie  Date Charge Nurse/RN Notified 05/26/21  Time Charge Nurse/RN Notified 0815  Notify: Provider  Provider Name/Title shah  Date Provider Notified 05/26/21  Time Provider Notified [phone]  Notification Type Face-to-face  Notification Reason Other (Comment)  Provider response See new orders  Date of Provider Response 05/26/21  Time of Provider Response 0816  Assess: SIRS CRITERIA  SIRS Temperature  0  SIRS Pulse 1  SIRS Respirations  0  SIRS WBC 1  SIRS Score Sum  2  Metoprolol 25mg  po ordered per Dr. Manuella Ghazi. Will continue to monitor closely

## 2021-05-26 NOTE — Progress Notes (Signed)
[facility] NAME: [name]    MR#:  [id number]  PCP: Lavera Guise, MD  DATE OF BIRTH:  May 25, 1941  SUBJECTIVE:  CHIEF COMPLAINT:   Chief Complaint  Patient presents with   Chest Pain   Shortness of Breath  Improving shortness of breath. REVIEW OF SYSTEMS:  Review of Systems  Constitutional:  Negative for diaphoresis, fever, malaise/fatigue and weight loss.  HENT:  Negative for ear discharge, ear pain, hearing loss, nosebleeds, sore throat and tinnitus.   Eyes:  Negative for blurred vision and pain.  Respiratory:  Positive for shortness of breath. Negative for cough, hemoptysis and wheezing.   Cardiovascular:  Negative for chest pain, palpitations, orthopnea and leg swelling.  Gastrointestinal:  Negative for abdominal pain, blood in stool, constipation, diarrhea, heartburn, nausea and vomiting.  Genitourinary:  Negative for dysuria, frequency and urgency.  Musculoskeletal:  Negative for back pain and myalgias.  Skin:  Negative for itching and rash.  Neurological:  Negative for dizziness, tingling, tremors, focal weakness, seizures, weakness and headaches.  Psychiatric/Behavioral:  Negative for depression. The patient is not nervous/anxious.   DRUG ALLERGIES:  No Known Allergies VITALS:  Blood pressure 120/68, pulse 92, temperature 98.1 F (36.7 C), temperature source Oral, resp. rate 18, height 4\' 10"  (1.473 m), weight 50 kg, SpO2 98 %. PHYSICAL EXAMINATION:  Physical Exam 80 year old frail looking female in no acute distress Eyes pupil equal round reactive to light and accommodation.  No scleral icterus Lungs decreased breath sounds at the bases.  No wheezing rales rhonchi crepitation Cardiovascular 2/6 systolic ejection murmur.  Normal S1-S2 Abdomen soft, benign Extremities no pedal edema cyanosis or clubbing Neuro alert and oriented, nonfocal Skin no rash or lesion LABORATORY PANEL:  Female CBC Recent Labs  Lab  05/26/21 0448  WBC 13.8*  HGB 12.2  HCT 35.6*  PLT 259    ------------------------------------------------------------------------------------------------------------------ Chemistries  Recent Labs  Lab 05/25/21 0506 05/26/21 0448  NA 134* 135  K 3.9 3.9  CL 96* 93*  CO2 30 32  GLUCOSE 160* 185*  BUN 56* 64*  CREATININE 2.13* 2.44*  CALCIUM 8.9 8.7*  MG 2.1  --     MEDICATIONS:  Scheduled Meds:  atorvastatin  10 mg Oral QPM   citalopram  40 mg Oral Daily   donepezil  5 mg Oral Daily   ferrous sulfate  325 mg Oral Q breakfast   furosemide  40 mg Intravenous Q12H   linaclotide  145 mcg Oral QAC breakfast   metoprolol tartrate  25 mg Oral Q6H   pantoprazole  40 mg Oral BID   rOPINIRole  0.5 mg Oral QPM   cyanocobalamin  1,000 mcg Oral Daily   warfarin  4 mg Oral ONCE-1600   Warfarin - Pharmacist Dosing Inpatient   Does not apply q1600   Continuous Infusions: RADIOLOGY:  ECHOCARDIOGRAM COMPLETE  Result Date: 05/26/2021    ECHOCARDIOGRAM REPORT   Patient Name:   [name] Date of Exam: 05/26/2021 Medical Rec #:  [id number]      Height:       58.0 in Accession #:    [id number]     Weight:       110.2 lb Date of Birth:  01/14/1941      BSA:          1.414 m Patient Age:    80 years       BP:  115/65 mmHg Patient Gender: F              HR:           125 bpm. Exam Location:  [facility] Procedure: 2D Echo, Color Doppler and Cardiac Doppler Indications:     I35.9 Aortic valve disorder  History:         Patient has prior history of Echocardiogram examinations, most                  recent 12/24/2020. CHF, CKD, Arrythmias:Atrial Fibrillation; Risk                  Factors:Hypertension, Diabetes and Dyslipidemia.  Sonographer:     Charmayne Sheer RDCS (AE) Referring Phys:  [id number] Arvil Chaco Diagnosing Phys: Ida Rogue MD  Sonographer Comments: Global longitudinal strain was attempted. IMPRESSIONS  1. Left ventricular ejection fraction, by estimation, is 55 %. The left  ventricle has normal function. The left ventricle has no regional wall motion abnormalities. There is mild left ventricular hypertrophy. Left ventricular diastolic parameters are indeterminate. The average left ventricular global longitudinal strain is -5.9 %. The global longitudinal strain is abnormal.  2. Right ventricular systolic function is normal. The right ventricular size is normal.  3. The mitral valve is normal in structure. Mild mitral valve regurgitation. No evidence of mitral stenosis.  4. The aortic valve is moderately calcified. Aortic valve regurgitation is mild to moderate. Mild aortic valve stenosis. FINDINGS  Left Ventricle: Left ventricular ejection fraction, by estimation, is 55 %. The left ventricle has normal function. The left ventricle has no regional wall motion abnormalities. The average left ventricular global longitudinal strain is -5.9 %. The global longitudinal strain is abnormal. The left ventricular internal cavity size was normal in size. There is mild left ventricular hypertrophy. Left ventricular diastolic parameters are indeterminate. Right Ventricle: The right ventricular size is normal. No increase in right ventricular wall thickness. Right ventricular systolic function is normal. Left Atrium: Left atrial size was normal in size. Right Atrium: Right atrial size was normal in size. Pericardium: There is no evidence of pericardial effusion. Mitral Valve: The mitral valve is normal in structure. Mild mitral annular calcification. Mild mitral valve regurgitation. No evidence of mitral valve stenosis. Tricuspid Valve: The tricuspid valve is normal in structure. Tricuspid valve regurgitation is not demonstrated. No evidence of tricuspid stenosis. Aortic Valve: The aortic valve is normal in structure. There is moderate calcification of the aortic valve. Aortic valve regurgitation is mild to moderate. Aortic regurgitation PHT measures 404 msec. Mild aortic stenosis is present. Aortic  valve mean gradient measures 7.7 mmHg. Aortic valve peak gradient measures 14.1 mmHg. Aortic valve area, by VTI measures 1.15 cm. Pulmonic Valve: The pulmonic valve was normal in structure. Pulmonic valve regurgitation is not visualized. No evidence of pulmonic stenosis. Aorta: The aortic root is normal in size and structure. Venous: The inferior vena cava is normal in size with greater than 50% respiratory variability, suggesting right atrial pressure of 3 mmHg. IAS/Shunts: No atrial level shunt detected by color flow Doppler.  LEFT VENTRICLE PLAX 2D LVIDd:         3.70 cm  Diastology LVIDs:         3.10 cm  LV e' medial:    7.40 cm/s LV PW:         1.00 cm  LV E/e' medial:  9.9 LV IVS:        0.80 cm  LV e' lateral:  11.00 cm/s LVOT diam:     1.80 cm  LV E/e' lateral: 6.6 LV SV:         38 LV SV Index:   27       2D Longitudinal Strain LVOT Area:     2.54 cm 2D Strain GLS Avg:     -5.9 %  RIGHT VENTRICLE RV Basal diam:  2.90 cm LEFT ATRIUM             Index       RIGHT ATRIUM           Index LA diam:        3.90 cm 2.76 cm/m  RA Area:     14.20 cm LA Vol (A2C):   52.2 ml 36.92 ml/m RA Volume:   28.20 ml  19.95 ml/m LA Vol (A4C):   48.0 ml 33.95 ml/m LA Biplane Vol: 52.5 ml 37.14 ml/m  AORTIC VALVE                    PULMONIC VALVE AV Area (Vmax):    1.19 cm     PV Vmax:       0.66 m/s AV Area (Vmean):   1.23 cm     PV Vmean:      47.400 cm/s AV Area (VTI):     1.15 cm     PV VTI:        0.111 m AV Vmax:           187.67 cm/s  PV Peak grad:  1.7 mmHg AV Vmean:          128.667 cm/s PV Mean grad:  1.0 mmHg AV VTI:            0.331 m AV Peak Grad:      14.1 mmHg AV Mean Grad:      7.7 mmHg LVOT Vmax:         87.50 cm/s LVOT Vmean:        62.300 cm/s LVOT VTI:          0.150 m LVOT/AV VTI ratio: 0.45 AI PHT:            404 msec  AORTA Ao Root diam: 2.50 cm MITRAL VALVE MV Area (PHT): 6.47 cm    SHUNTS MV Decel Time: 117 msec    Systemic VTI:  0.15 m MV E velocity: 73.00 cm/s  Systemic Diam: 1.80 cm  Ida Rogue MD Electronically signed by Ida Rogue MD Signature Date/Time: 05/26/2021/1:54:02 PM    Final    ASSESSMENT AND PLAN:  80 y.o. female with medical history significant of HTN, HLD, DM, pulmonary fibrosis, dCHF, s/p of right nephrectomy, CKD stage IV, paroxysmal atrial fibrillation on Coumadin, anemia of chronic kidney disease, colitis, GERD, depression, dementia, legally blindness, admitted for worsening shortness breath and weight gain  Principal Problem:   Acute on chronic diastolic congestive heart failure (HCC) Active Problems:   GERD (gastroesophageal reflux disease)   Atrial fibrillation (HCC)   CKD stage 4 due to type 2 diabetes mellitus (HCC)   Essential hypertension   Anemia associated with stage 4 chronic renal failure (HCC)   Type II diabetes mellitus with renal manifestations (HCC)   HLD (hyperlipidemia)   Depression   Legally blind   Abdominal pain  Acute on chronic diastolic congestive heart failure ([place]): 2D echo on 12/22/2020 showed EF of 60 to 65%.  Patient does not have leg edema, but has elevated BNP 1334 on admission,  interstitial pulmonary edema on chest x-ray,No oxygen desaturation on room air at rest or on ambulation. -Monitor on progressive unit as inpatient -Continue Lasix 40 mg IV twice daily Net IO Since Admission: -5,470 mL [05/26/21 1642]  -2d echo pending this admission -Daily weights.  Daughter reports 10 pound weight gain at home -strict I/O's -Low salt diet -Fluid restriction   Abdominal pain: Patient reported epigastric abdominal pain on admission-now resolved -Normal lipase -CT scan of abdomen/pelvis without contrast within normal limits   GERD (gastroesophageal reflux disease) -Continue Protonix   Paroxysmal atrial fibrillation ([place]): Heart rate 70-90s in normal sinus rhythm -Continue Coumadin.  INR 1.9 -Mediate rate controlling agent if heart rate goes up   CKD stage 4 due to type 2 diabetes mellitus ([place]): Recent creatinine  2.33 on 03/16/2021.  Her creatinine is 2.13, slowly worsening.  Creatinine of 2.44 likely due to diuretics -Monitor renal function closely by BMP -Dr. Juleen China of nephrology is following   Essential hypertension: Blood pressure 205/56, 175/40 on admission patient is taking oral hydralazine 25 mg 3 times daily as needed at home -Discontinue hydralazine and increase the dose of metoprolol 25 mg p.o. every 6 for better heart rate control.  As needed IV hydralazine   Anemia associated with stage 4 chronic renal failure [facility]): Hemoglobin stable, 12.2 (9.1 on 03/16/2022 -Follow-up with CBC -Continue iron supplement   Diet controlled Type II diabetes mellitus with renal manifestations [facility]): Recent A1c 6.6, well controlled.  Patient not taking medications currently.   -Check CBG every morning   HLD (hyperlipidemia) -Lipitor   Depression: -Celexa   Legally blind -Fall precaution   Moderate protein calorie malnutrition Body mass index is 23.04 kg/m. She has muscle wasting  Net IO Since Admission: -5,470 mL [05/26/21 1642]      LOS: 2 days   Consultants: Cardiology/[facility]   Antibiotics: None  Status is: Inpatient  Remains inpatient appropriate because:Hemodynamically unstable  Dispo: The patient is from: Home              Anticipated d/c is to: Home              Patient currently is not medically stable to d/c.  Will need ongoing diuresis and heart rate control/A. fib before discharge back    Difficult to place patient No   DVT prophylaxis:        warfarin (COUMADIN) tablet 4 mg     Family Communication: Updated patient's daughter over phone on 8/3   All the records are reviewed and case discussed with Nursing and TOC team. Management plans discussed with the patient, family and they are in agreement.  CODE STATUS: Partial Code Level of care: Progressive Cardiac  TOTAL TIME TAKING CARE OF THIS PATIENT: 35 minutes.   More than 50% of the time was spent in  counseling/coordination of care: YES  POSSIBLE D/C IN 1-2 DAYS, DEPENDING ON CLINICAL CONDITION.   Max Sane M.D on 05/26/2021 at 4:42 PM  [facility]   CC: Primary care physician; Lavera Guise, MD  Note: This dictation was prepared with Dragon dictation along with smaller phrase technology. Any transcriptional errors that result from this process are unintentional.

## 2021-05-26 NOTE — Progress Notes (Signed)
*  PRELIMINARY RESULTS* Echocardiogram 2D Echocardiogram has been performed.  [name] 05/26/2021, 9:31 AM

## 2021-05-26 NOTE — Progress Notes (Signed)
Progress Note  Patient Name: [name] Date of Encounter: 05/26/2021  Primary Cardiologist: New to [facility] - consult by Dr. Garen Lah (previously evaluated by [facility] in 4 and 02/2021)  Subjective   Feeling a little better. She feels like she continues to "hold onto fluid." Feels abdominal swelling. No chest pain or palpitations. Reports adherence to medications at home. Documented UOP ~ 800 mL for the past 24 hours and net - 4.5 L for the admission. Weight 55-->50 kg over the past 24 hours. She remains on IV Lasix 40 mg bid.  BUN/SCr 56/2.13 to 64/2.44. Afib with RVR with ventricular rates 130s bpm overnight with rates in the low 100s to 1-teens this morning.   Inpatient Medications    Scheduled Meds:  aspirin EC  81 mg Oral Daily   atorvastatin  10 mg Oral QPM   citalopram  40 mg Oral Daily   donepezil  5 mg Oral Daily   ferrous sulfate  325 mg Oral Q breakfast   furosemide  40 mg Intravenous Q12H   hydrALAZINE  25 mg Oral Q8H   linaclotide  145 mcg Oral QAC breakfast   metoprolol tartrate  25 mg Oral BID   pantoprazole  40 mg Oral BID   rOPINIRole  0.5 mg Oral QPM   cyanocobalamin  1,000 mcg Oral Daily   warfarin  4 mg Oral ONCE-1600   Warfarin - Pharmacist Dosing Inpatient   Does not apply q1600   Continuous Infusions:  PRN Meds: acetaminophen (TYLENOL) oral liquid 160 mg/5 mL, albuterol, dextromethorphan-guaiFENesin, hydrALAZINE, melatonin, nitroGLYCERIN, ondansetron (ZOFRAN) IV, traMADol   Vital Signs    Vitals:   05/26/21 0000 05/26/21 0440 05/26/21 0558 05/26/21 0811  BP: 130/84  122/72 115/65  Pulse:   (!) 104 (!) 115  Resp: (!) 21  18 20   Temp: 98.6 F (37 C)  98 F (36.7 C) 98 F (36.7 C)  TempSrc: Oral   Oral  SpO2: 98%  97% 98%  Weight:  50 kg    Height:        Intake/Output Summary (Last 24 hours) at 05/26/2021 0843 Last data filed at 05/26/2021 0400 Gross per 24 hour  Intake 510 ml  Output 1300 ml  Net -790 ml   Filed Weights    05/25/21 0427 05/26/21 0440  Weight: 55 kg 50 kg    Telemetry    Afib with RVR with rates in the 130s bpm overnight with rates in the low 100s to 1-teens this morning - Personally Reviewed  ECG    8/3 - Afib with RVR, 129 bpm, LVH, nonspecific st/t changes - Personally Reviewed  Physical Exam   GEN: No acute distress.   Neck: No JVD. Cardiac: Tachycardic, IRIR, I/VI systolic murmur RUSB, no rubs, or gallops.  Respiratory: Clear to auscultation bilaterally.  GI: Soft, nontender, non-distended.   MS: No edema; No deformity. Neuro:  Alert and oriented x 3; Nonfocal.  Psych: Normal affect.  Labs    Chemistry Recent Labs  Lab 05/24/21 0455 05/25/21 0506 05/26/21 0448  NA 136 134* 135  K 3.8 3.9 3.9  CL 104 96* 93*  CO2 25 30 32  GLUCOSE 196* 160* 185*  BUN 47* 56* 64*  CREATININE 1.94* 2.13* 2.44*  CALCIUM 8.7* 8.9 8.7*  GFRNONAA 26* 23* 20*  ANIONGAP 7 8 10      Hematology Recent Labs  Lab 05/24/21 0455 05/26/21 0448  WBC 8.2 13.8*  RBC 3.15* 3.87  HGB 9.7* 12.2  HCT 29.3* 35.6*  MCV 93.0 92.0  MCH 30.8 31.5  MCHC 33.1 34.3  RDW 14.3 14.9  PLT 245 259    Cardiac EnzymesNo results for input(s): TROPONINI in the last 168 hours. No results for input(s): TROPIPOC in the last 168 hours.   BNP Recent Labs  Lab 05/24/21 0455  BNP 1,334.6*     DDimer No results for input(s): DDIMER in the last 168 hours.   Radiology    CT ABDOMEN PELVIS WO CONTRAST  Result Date: 05/24/2021 IMPRESSION: No acute abnormality within the abdomen or pelvis. Small bilateral pleural effusions and dependent subcutaneous edema suggestive of volume overload. Mild cardiomegaly. Aortic Atherosclerosis (ICD10-I70.0). Electronically Signed   By: Inge Rise M.D.   On: 05/24/2021 12:57    Cardiac Studies   2D echo 12/2020: 1. Left ventricular ejection fraction, by estimation, is 60 to 65%. The  left ventricle has normal function. The left ventricle has no regional  wall motion  abnormalities. Left ventricular diastolic parameters are  indeterminate.   2. Right ventricular systolic function is normal. The right ventricular  size is normal. There is normal pulmonary artery systolic pressure.   3. Left atrial size was mildly dilated.   4. There is moderate calcification of the aortic valve. Aortic valve  regurgitation is mild. Mild aortic valve stenosis.   5. The inferior vena cava is dilated in size with <50% respiratory  variability, suggesting right atrial pressure of 15 mmHg.   6. Left pleural effusion noted. __________  2D echo 05/26/2021: Pending  Patient Profile     80 y.o. female with history of HFpEF, PAF, DM2, HTN, pulmonary fibrosis, CKD stage IV, anemia of chronic disease, legal blindness, and tobacco use who we are seeing for acute on chronic HFpEF and A. fib with RVR.  Assessment & Plan    1.  Acute on chronic HFpEF: -Overall, she does not appear grossly volume overloaded though does appear to be retaining some fluid -She remains on IV Lasix 40 mg twice daily -Await echo to evaluate right heart pressures and for potential cardiomyopathy -If needed, to further estimate hemodynamic status, could consider RHC -Volume status likely exacerbated by A. fib with RVR -Daily weights -Strict I's and O's  2.  PAF with RVR: -Presented in sinus rhythm with development of A. fib with RVR on 05/25/2021 with ventricular rates up into the 130s bpm, currently in the low 100s to 1 teens bpm -Discontinue hydralazine to allow for increased BP room for rate control -Transition Lopressor to 25 mg every 6 hours -CHA2DS2-VASc at least 6 (CHF, HTN, age x2, diabetes, sex category) -INR 1.9 -Warfarin per pharmacy -HS-Tn negative x 2, stop ASA -Check TSH  3.  Acute on CKD stage IV: -Monitor with diuresis -Nephrology is following  4.  Anemia of chronic disease: -Hgb trended from 9.7-12.2 over the past 48 hours, on iron supplementation, no blood transfusion -Consider  redrawing to verify  5.  HTN: -Blood pressure -Stop hydralazine as outlined above with escalation of metoprolol for added rate control  6.  HLD: -LDL 35 -PTA Lipitor    For questions or updates, please contact [place] Please consult [email or web address] for contact info under Cardiology/STEMI.    Signed, Christell Faith, PA-C [place] Pager: [phone] 05/26/2021, 8:43 AM

## 2021-05-26 NOTE — Consult Note (Signed)
   Heart Failure Nurse Navigator Note  HFpEF 60 to 65%.  Mild aortic stenosis.  Echocardiogram pending  this admission.  She presented with complaints of worsening shortness of breath and had noticed a 10 pound weight gain.  Comorbidities:  Hypertension Hyperlipidemia Diabetes Pulmonary fibrosis Chronic kidney disease stage IV Atrial fibrillation Anemia GERD Depression   Medications:  Atorvastatin 10 mg in the evening Furosemide 40 mg IV every 12 Metoprolol tartrate 25 mg p.o. every 6 hours  Labs:  Sodium 135, potassium 3.9, chloride 93, CO2 32, BUN 64 up from 56 of yesterday, creatinine 2.44 up from 2.12 of yesterday. Weight is 50 kg down from 55 of yesterday Intake 510 mL Output 1300 mL  Met with patient today.  Currently lying in bed watching TV in no acute distress.  Remains in A. fib with rates of 1 teens to 130s.   She continues to live with her daughter who works during the day.  She states during that time she goes to an adult center she eats lunch.  At home she states she continues to eat canned soups and likes green beans which come from the canned.  Discussed rinsing nose in a colander and then cooking in plain water.  States that she been taking her medications as ordered.  Also talked about daily weights, she states that she does weigh herself daily but does not record.  She felt that the 10 pound weight gain was almost overnight.  Discussed recording weights on a daily basis and recording a 2 to 3 pound weight gain daily or 5 pounds within the week to her physician.  Follow-up appointment at the outpatient heart failure clinic with Darylene Price on August 17 at 9 AM.  She was given the living with heart failure teaching booklet along with information on low-sodium.  Will continue to follow.   Pricilla Riffle RN [facility]

## 2021-05-26 NOTE — Progress Notes (Signed)
ANTICOAGULATION CONSULT NOTE - Initial Consult  Pharmacy Consult for Warfarin management Indication: atrial fibrillation  No Known Allergies  Vital Signs: Temp: 98 F (36.7 C) (08/04 0558) Temp Source: Oral (08/04 0000) BP: 122/72 (08/04 0558) Pulse Rate: 104 (08/04 0558)  Labs: Recent Labs    05/24/21 0455 05/24/21 0742 05/25/21 0506 05/26/21 0448  HGB 9.7*  --   --  12.2  HCT 29.3*  --   --  35.6*  PLT 245  --   --  259  LABPROT 25.3*  --  23.6*  --   INR 2.3*  --  2.1*  --   CREATININE 1.94*  --  2.13* 2.44*  TROPONINIHS 15 16  --   --      Medical History: Past Medical History:  Diagnosis Date   Anemia    Atrial fibrillation (HCC)    Chronic kidney disease    Congestive heart failure (CHF) (HCC)    Diabetes mellitus without complication (HCC)    GERD (gastroesophageal reflux disease)    Hyperlipidemia    Hypertension    Pneumonia    Pulmonary fibrosis (HCC)     Medications:  Scheduled:   aspirin EC  81 mg Oral Daily   atorvastatin  10 mg Oral QPM   citalopram  40 mg Oral Daily   donepezil  5 mg Oral Daily   ferrous sulfate  325 mg Oral Q breakfast   furosemide  40 mg Intravenous Q12H   hydrALAZINE  25 mg Oral Q8H   linaclotide  145 mcg Oral QAC breakfast   metoprolol tartrate  12.5 mg Oral BID   pantoprazole  40 mg Oral BID   rOPINIRole  0.5 mg Oral QPM   cyanocobalamin  1,000 mcg Oral Daily   warfarin  2 mg Oral Once per day on Sun Tue Thu Fri   And   warfarin  4 mg Oral Once per day on Mon Wed Sat    Assessment: Patient anticoagulation stable. Admitted with chest pain and shortness of breath. PMH includes HFpEF, ATRIAL FIBRILLATION, CKD, HTN, HLP, and pulmonary fibrosis.  Renal function and Hgb stable. Pharmacy has been consulted for warfarin management.   Patient home regimen:  4 mg on Mon/Wed/Sat; 2 mg on all other days  Pt last dose was 4 mg on 05/23/21  08/02 INR 2.3  therapeutic > 2mg  08/03 INR 2.1  therapeutic > 4 mg 08/04 INR 1.9   subtherapeutic > 4 mg   Goal of Therapy:  INR 2-3    Plan:  INR 1.9 subtherapeutic, and trending down. Will give 4 mg tonight. Check INR daily with AM labs Monitor CBC per protocol   Sherilyn Banker, PharmD Clinical Pharmacist 05/26/2021 7:21 AM

## 2021-05-27 DIAGNOSIS — I480 Paroxysmal atrial fibrillation: Secondary | ICD-10-CM | POA: Diagnosis not present

## 2021-05-27 DIAGNOSIS — I5033 Acute on chronic diastolic (congestive) heart failure: Secondary | ICD-10-CM | POA: Diagnosis not present

## 2021-05-27 LAB — CBC
HCT: 34.8 % — ABNORMAL LOW (ref 36.0–46.0)
Hemoglobin: 12 g/dL (ref 12.0–15.0)
MCH: 31.9 pg (ref 26.0–34.0)
MCHC: 34.5 g/dL (ref 30.0–36.0)
MCV: 92.6 fL (ref 80.0–100.0)
Platelets: 236 10*3/uL (ref 150–400)
RBC: 3.76 MIL/uL — ABNORMAL LOW (ref 3.87–5.11)
RDW: 14.8 % (ref 11.5–15.5)
WBC: 13.9 10*3/uL — ABNORMAL HIGH (ref 4.0–10.5)
nRBC: 0 % (ref 0.0–0.2)

## 2021-05-27 LAB — GLUCOSE, CAPILLARY
Glucose-Capillary: 237 mg/dL — ABNORMAL HIGH (ref 70–99)
Glucose-Capillary: 181 mg/dL — ABNORMAL HIGH (ref 70–99)
Glucose-Capillary: 165 mg/dL — ABNORMAL HIGH (ref 70–99)

## 2021-05-27 LAB — BASIC METABOLIC PANEL
Anion gap: 11 (ref 5–15)
BUN: 92 mg/dL — ABNORMAL HIGH (ref 8–23)
CO2: 29 mmol/L (ref 22–32)
Calcium: 8.6 mg/dL — ABNORMAL LOW (ref 8.9–10.3)
Chloride: 95 mmol/L — ABNORMAL LOW (ref 98–111)
Creatinine, Ser: 2.97 mg/dL — ABNORMAL HIGH (ref 0.44–1.00)
GFR, Estimated: 15 mL/min — ABNORMAL LOW (ref >60)
Glucose, Bld: 203 mg/dL — ABNORMAL HIGH (ref 70–99)
Potassium: 3.9 mmol/L (ref 3.5–5.1)
Sodium: 135 mmol/L (ref 135–145)

## 2021-05-27 LAB — HEPATITIS B SURFACE ANTIGEN: Hepatitis B Surface Ag: NONREACTIVE

## 2021-05-27 LAB — PROTIME-INR
INR: 2.1 — ABNORMAL HIGH (ref 0.8–1.2)
Prothrombin Time: 23.9 seconds — ABNORMAL HIGH (ref 11.4–15.2)

## 2021-05-27 MED ORDER — WARFARIN SODIUM 2 MG PO TABS
2.0000 mg | ORAL_TABLET | Freq: Once | ORAL | Status: AC
  Administered 2021-05-27: 2 mg via ORAL
  Filled 2021-05-27: qty 1

## 2021-05-27 MED ORDER — METOPROLOL TARTRATE 50 MG PO TABS
75.0000 mg | ORAL_TABLET | Freq: Two times a day (BID) | ORAL | Status: DC
  Administered 2021-05-27 – 2021-05-30 (×7): 75 mg via ORAL
  Filled 2021-05-27 (×7): qty 1

## 2021-05-27 MED ORDER — FUROSEMIDE 40 MG PO TABS
40.0000 mg | ORAL_TABLET | Freq: Every day | ORAL | Status: DC
  Administered 2021-05-27 – 2021-05-29 (×3): 40 mg via ORAL
  Filled 2021-05-27 (×3): qty 1

## 2021-05-27 MED ORDER — INSULIN ASPART 100 UNIT/ML IJ SOLN
0.0000 [IU] | Freq: Every day | INTRAMUSCULAR | Status: DC
  Administered 2021-05-29: 2 [IU] via SUBCUTANEOUS
  Filled 2021-05-27: qty 1

## 2021-05-27 MED ORDER — INSULIN ASPART 100 UNIT/ML IJ SOLN
0.0000 [IU] | Freq: Three times a day (TID) | INTRAMUSCULAR | Status: DC
  Administered 2021-05-27 – 2021-05-28 (×3): 2 [IU] via SUBCUTANEOUS
  Administered 2021-05-28: 1 [IU] via SUBCUTANEOUS
  Administered 2021-05-29: 2 [IU] via SUBCUTANEOUS
  Administered 2021-05-29: 3 [IU] via SUBCUTANEOUS
  Administered 2021-05-30: 2 [IU] via SUBCUTANEOUS
  Filled 2021-05-27 (×7): qty 1

## 2021-05-27 NOTE — Progress Notes (Signed)
Progress Note  Patient Name: [name] Date of Encounter: 05/27/2021  Primary Cardiologist: New to [facility] - consult by Dr. Garen Lah (previously evaluated by [facility] in 4 and 02/2021)  Subjective   Dyspnea improved, feels like she is at her baseline. No chest pain, palpitations, dizziness, presyncope, or syncope. Renal function continues to worsen with a trend of 64/2.44-->92/2.97 over the past 24 hours. Documented UOP 1.7 L for the past 24 hours and net - 6.3 L for the admission. Weight unchanged at 50 kg. Echo this admission performed yesterday showed a preserved LVSF, normal wall motion, normal RVSF, mild to moderate AI and mild aortic stenosis.   Inpatient Medications    Scheduled Meds:  atorvastatin  10 mg Oral QPM   citalopram  40 mg Oral Daily   donepezil  5 mg Oral Daily   ferrous sulfate  325 mg Oral Q breakfast   linaclotide  145 mcg Oral QAC breakfast   metoprolol tartrate  25 mg Oral Q6H   pantoprazole  40 mg Oral BID   rOPINIRole  0.5 mg Oral QPM   cyanocobalamin  1,000 mcg Oral Daily   Warfarin - Pharmacist Dosing Inpatient   Does not apply q1600   Continuous Infusions:  PRN Meds: acetaminophen (TYLENOL) oral liquid 160 mg/5 mL, albuterol, dextromethorphan-guaiFENesin, hydrALAZINE, melatonin, nitroGLYCERIN, ondansetron (ZOFRAN) IV, traMADol   Vital Signs    Vitals:   05/26/21 2049 05/27/21 0338 05/27/21 0515 05/27/21 0717  BP:    (!) 121/96  Pulse:    91  Resp:    20  Temp: 97.9 F (36.6 C)  98.2 F (36.8 C) 98.3 F (36.8 C)  TempSrc: Oral  Oral Oral  SpO2:    98%  Weight:  50 kg    Height:        Intake/Output Summary (Last 24 hours) at 05/27/2021 0803 Last data filed at 05/27/2021 0500 Gross per 24 hour  Intake 960 ml  Output 2700 ml  Net -1740 ml    Filed Weights   05/25/21 0427 05/26/21 0440 05/27/21 0338  Weight: 55 kg 50 kg 50 kg    Telemetry    Afib with rates in the low 100s bpm with brief episodes into the 120s bpm -  Personally Reviewed  ECG    No new tracings - Personally Reviewed  Physical Exam   GEN: No acute distress.   Neck: No JVD. Cardiac: Mildly tachycardic, IRIR, I/VI systolic murmur RUSB, no rubs, or gallops.  Respiratory: Clear to auscultation bilaterally.  GI: Soft, nontender, non-distended.   MS: No edema; No deformity. Neuro:  Alert and oriented x 3; Nonfocal.  Psych: Normal affect.  Labs    Chemistry Recent Labs  Lab 05/25/21 0506 05/26/21 0448 05/27/21 0507  NA 134* 135 135  K 3.9 3.9 3.9  CL 96* 93* 95*  CO2 30 32 29  GLUCOSE 160* 185* 203*  BUN 56* 64* 92*  CREATININE 2.13* 2.44* 2.97*  CALCIUM 8.9 8.7* 8.6*  GFRNONAA 23* 20* 15*  ANIONGAP 8 10 11       Hematology Recent Labs  Lab 05/24/21 0455 05/26/21 0448 05/27/21 0507  WBC 8.2 13.8* 13.9*  RBC 3.15* 3.87 3.76*  HGB 9.7* 12.2 12.0  HCT 29.3* 35.6* 34.8*  MCV 93.0 92.0 92.6  MCH 30.8 31.5 31.9  MCHC 33.1 34.3 34.5  RDW 14.3 14.9 14.8  PLT 245 259 236     Cardiac EnzymesNo results for input(s): TROPONINI in the last 168 hours.  No results for input(s): TROPIPOC in the last 168 hours.   BNP Recent Labs  Lab 05/24/21 0455  BNP 1,334.6*      DDimer No results for input(s): DDIMER in the last 168 hours.   Radiology    CT ABDOMEN PELVIS WO CONTRAST  Result Date: 05/24/2021 IMPRESSION: No acute abnormality within the abdomen or pelvis. Small bilateral pleural effusions and dependent subcutaneous edema suggestive of volume overload. Mild cardiomegaly. Aortic Atherosclerosis (ICD10-I70.0). Electronically Signed   By: Inge Rise M.D.   On: 05/24/2021 12:57    Cardiac Studies   2D echo 12/2020: 1. Left ventricular ejection fraction, by estimation, is 60 to 65%. The  left ventricle has normal function. The left ventricle has no regional  wall motion abnormalities. Left ventricular diastolic parameters are  indeterminate.   2. Right ventricular systolic function is normal. The right  ventricular  size is normal. There is normal pulmonary artery systolic pressure.   3. Left atrial size was mildly dilated.   4. There is moderate calcification of the aortic valve. Aortic valve  regurgitation is mild. Mild aortic valve stenosis.   5. The inferior vena cava is dilated in size with <50% respiratory  variability, suggesting right atrial pressure of 15 mmHg.   6. Left pleural effusion noted. __________  2D echo 05/26/2021: Pending  Patient Profile     80 y.o. female with history of HFpEF, PAF, DM2, HTN, pulmonary fibrosis, CKD stage IV, anemia of chronic disease, legal blindness, and tobacco use who we are seeing for acute on chronic HFpEF and A. fib with RVR.  Assessment & Plan    1.  Acute on chronic HFpEF: -Overall, she does not appear grossly volume overloaded though does appear to be retaining some fluid -IV Lasix held this morning with continued decline in renal function -Echo as above -If needed, to further estimate hemodynamic status, could consider RHC -Volume status has likely exacerbated by A. fib with RVR -Daily weights -Strict I's and O's  2.  PAF with RVR: -Presented in sinus rhythm with development of A. fib with RVR on 05/25/2021 with ventricular rates up into the 130s bpm, currently in the low 100s to 1 teens bpm -Titrate and consolidate Lopressor to 75 mg bid -CHA2DS2-VASc at least 6 (CHF, HTN, age x2, diabetes, sex category) -INR 2.1 -Warfarin per pharmacy -HS-Tn negative x 2 -TSH normal  3.  Acute on CKD stage IV: -Declining -Lasix held this morning  -Monitor with diuresis -Nephrology is following  4.  Anemia of chronic disease: -Hgb trended from 9.7-12 over the past 48 hours, on iron supplementation, no blood transfusion -Consider redrawing to verify  5.  HTN: -Blood pressure stable -Continue medical therapy as above  6.  HLD: -LDL 35 -PTA Lipitor    For questions or updates, please contact [place] Please consult  [email or web address] for contact info under Cardiology/STEMI.    Signed, Christell Faith, PA-C [place] Pager: [phone] 05/27/2021, 8:03 AM

## 2021-05-27 NOTE — Progress Notes (Signed)
[facility] [place] NAME: [name]    MR#:  [id number]  PCP: Lavera Guise, MD  DATE OF BIRTH:  1941/08/15  SUBJECTIVE:  CHIEF COMPLAINT:   Chief Complaint  Patient presents with   Chest Pain   Shortness of Breath  Improving shortness of breath.  She feels close to her baseline. REVIEW OF SYSTEMS:  Review of Systems  Constitutional:  Negative for diaphoresis, fever, malaise/fatigue and weight loss.  HENT:  Negative for ear discharge, ear pain, hearing loss, nosebleeds, sore throat and tinnitus.   Eyes:  Negative for blurred vision and pain.  Respiratory:  Positive for shortness of breath. Negative for cough, hemoptysis and wheezing.   Cardiovascular:  Negative for chest pain, palpitations, orthopnea and leg swelling.  Gastrointestinal:  Negative for abdominal pain, blood in stool, constipation, diarrhea, heartburn, nausea and vomiting.  Genitourinary:  Negative for dysuria, frequency and urgency.  Musculoskeletal:  Negative for back pain and myalgias.  Skin:  Negative for itching and rash.  Neurological:  Negative for dizziness, tingling, tremors, focal weakness, seizures, weakness and headaches.  Psychiatric/Behavioral:  Negative for depression. The patient is not nervous/anxious.   DRUG ALLERGIES:  No Known Allergies VITALS:  Blood pressure 120/64, pulse 71, temperature 98.2 F (36.8 C), temperature source Oral, resp. rate 20, height 4\' 10"  (1.473 m), weight 50 kg, SpO2 99 %. PHYSICAL EXAMINATION:  Physical Exam 80 year old frail looking female in no acute distress Eyes pupil equal round reactive to light and accommodation.  No scleral icterus Lungs decreased breath sounds at the bases.  No wheezing rales rhonchi crepitation Cardiovascular 2/6 systolic ejection murmur.  Normal S1-S2 Abdomen soft, benign Extremities no pedal edema cyanosis or clubbing Neuro alert and oriented, nonfocal Skin no rash or lesion LABORATORY PANEL:   Female CBC Recent Labs  Lab 05/27/21 0507  WBC 13.9*  HGB 12.0  HCT 34.8*  PLT 236    ------------------------------------------------------------------------------------------------------------------ Chemistries  Recent Labs  Lab 05/25/21 0506 05/26/21 0448 05/27/21 0507  NA 134*   < > 135  K 3.9   < > 3.9  CL 96*   < > 95*  CO2 30   < > 29  GLUCOSE 160*   < > 203*  BUN 56*   < > 92*  CREATININE 2.13*   < > 2.97*  CALCIUM 8.9   < > 8.6*  MG 2.1  --   --    < > = values in this interval not displayed.    MEDICATIONS:  Scheduled Meds:  atorvastatin  10 mg Oral QPM   citalopram  40 mg Oral Daily   donepezil  5 mg Oral Daily   ferrous sulfate  325 mg Oral Q breakfast   furosemide  40 mg Oral Daily   insulin aspart  0-5 Units Subcutaneous QHS   insulin aspart  0-9 Units Subcutaneous TID WC   linaclotide  145 mcg Oral QAC breakfast   metoprolol tartrate  75 mg Oral BID   pantoprazole  40 mg Oral BID   rOPINIRole  0.5 mg Oral QPM   cyanocobalamin  1,000 mcg Oral Daily   Warfarin - Pharmacist Dosing Inpatient   Does not apply q1600   Continuous Infusions: RADIOLOGY:  No results found. ASSESSMENT AND PLAN:  80 y.o. female with medical history significant of HTN, HLD, DM, pulmonary fibrosis, dCHF, s/p of right nephrectomy, CKD stage IV, paroxysmal atrial fibrillation on Coumadin, anemia of chronic  kidney disease, colitis, GERD, depression, dementia, legally blindness, admitted for worsening shortness breath and weight gain  Principal Problem:   Acute on chronic diastolic congestive heart failure (HCC) Active Problems:   GERD (gastroesophageal reflux disease)   Atrial fibrillation (HCC)   CKD stage 4 due to type 2 diabetes mellitus (HCC)   Essential hypertension   Anemia associated with stage 4 chronic renal failure (HCC)   Type II diabetes mellitus with renal manifestations (HCC)   HLD (hyperlipidemia)   Depression   Legally blind   Abdominal pain  Acute on  chronic diastolic congestive heart failure ([place]): 2D echo on 12/22/2020 showed EF of 60 to 65%.  Patient does not have leg edema, but has elevated BNP 1334 on admission, interstitial pulmonary edema on chest x-ray,No oxygen desaturation on room air at rest or on ambulation.   - IV Lasix held due to increased creatinine but Nephrology restarted Lasix 40 mg once daily for now Net IO Since Admission: -6,585 mL [05/27/21 1619]  -2d echo shows normal LV systolic function --strict I/O's -Low salt diet -Fluid restriction   Abdominal pain: Patient reported epigastric abdominal pain on admission-now resolved -Normal lipase -CT scan of abdomen/pelvis without contrast within normal limits   GERD (gastroesophageal reflux disease) -Continue Protonix   Paroxysmal atrial fibrillation ([place]):  -Continue Coumadin.  Therapeutic INR -Rate well controlled with metoprolol 75 mg p.o. twice daily   CKD stage 4 due to type 2 diabetes mellitus ([place]): -IV Lasix held this morning due to increase in creatinine but nephrology has restarted Lasix 40 mg p.o. daily for now Creatinine 2.97 today   Essential hypertension:  Blood pressure controlled with Lopressor 75 mg p.o. twice daily   Anemia associated with stage 4 chronic renal failure ([place]):  -Continue iron supplement   Diet controlled Type II diabetes mellitus with renal manifestations [facility]): Recent A1c 6.6,  Sliding scale insulin for now   HLD (hyperlipidemia) -Lipitor   Depression: -Celexa   Legally blind -Fall precaution   Moderate protein calorie malnutrition Body mass index is 23.04 kg/m. She has muscle wasting  Net IO Since Admission: -6,585 mL [05/27/21 1619]      LOS: 3 days   Consultants: Cardiology/[facility]   Antibiotics: None  Status is: Inpatient  Remains inpatient appropriate because:Hemodynamically unstable  Dispo: The patient is from: Home              Anticipated d/c is to: Home with home health              Patient  currently is not medically stable to d/c.  Needs a better fluid equilibrium and kidney function before discharge   Difficult to place patient No   DVT prophylaxis:          Therapeutic anticoagulation with warfarin    Family Communication: Updated patient's daughter over phone on 8/3   All the records are reviewed and case discussed with Nursing and TOC team. Management plans discussed with the patient, family and they are in agreement.  CODE STATUS: Partial Code Level of care: Progressive Cardiac  TOTAL TIME TAKING CARE OF THIS PATIENT: 35 minutes.   More than 50% of the time was spent in counseling/coordination of care: YES  POSSIBLE D/C IN 1-2 DAYS, DEPENDING ON CLINICAL CONDITION.   Max Sane M.D on 05/27/2021 at 4:19 PM  [facility]   CC: Primary care physician; Lavera Guise, MD  Note: This dictation was prepared with Dragon dictation along with smaller phrase technology. Any  transcriptional errors that result from this process are unintentional.

## 2021-05-27 NOTE — Progress Notes (Signed)
[facility]  ROUNDING NOTE   Subjective:   [name]  is a 80 y.o. female with past medical history of anemia, Atrial Fib, CHD, CHF diabetes, and hypertension. Patient reports having trouble breathing and having excess fluid. Patient will be admitted for Acute on chronic diastolic congestive heart failure (HCC) [I50.33] Acute on chronic congestive heart failure, unspecified heart failure type [facility]) [I50.9]  Patient is known to our practice and currently sees Dr Juleen China.   Patient sitting at the side of bed, eating breakfast Tolerating meals Denies shortness of breath Feels she still has some fluid on, but not as much as at time of admission  Objective:  Vital signs in last 24 hours:  Temp:  [97.9 F (36.6 C)-98.3 F (36.8 C)] 98.3 F (36.8 C) (08/05 1126) Pulse Rate:  [91-99] 99 (08/05 1126) Resp:  [18-20] 20 (08/05 1126) BP: (116-121)/(68-96) 116/75 (08/05 1126) SpO2:  [97 %-98 %] 97 % (08/05 1126) Weight:  [50 kg] 50 kg (08/05 0338)  Weight change: 0 kg Filed Weights   05/25/21 0427 05/26/21 0440 05/27/21 0338  Weight: 55 kg 50 kg 50 kg    Intake/Output: I/O last 3 completed shifts: In: 1110 [P.O.:1110] Out: 3700 [Urine:3700]   Intake/Output this shift:  Total I/O In: 120 [P.O.:120] Out: 375 [Urine:375]  Physical Exam: General: NAD, sitting bedside  Head: Normocephalic, atraumatic. Moist oral mucosal membranes  Eyes: Anicteric  Lungs:  Clear to auscultation, normal effort  Heart: Regular rate and rhythm  Abdomen:  Soft, nontender  Extremities:  no peripheral edema.  Neurologic: Nonfocal, moving all four extremities  Skin: No lesions       Basic Metabolic Panel: Recent Labs  Lab 05/24/21 0455 05/25/21 0506 05/26/21 0448 05/27/21 0507  NA 136 134* 135 135  K 3.8 3.9 3.9 3.9  CL 104 96* 93* 95*  CO2 25 30 32 29  GLUCOSE 196* 160* 185* 203*  BUN 47* 56* 64* 92*  CREATININE 1.94* 2.13* 2.44* 2.97*  CALCIUM 8.7* 8.9 8.7* 8.6*  MG   --  2.1  --   --      Liver Function Tests: No results for input(s): AST, ALT, ALKPHOS, BILITOT, PROT, ALBUMIN in the last 168 hours. Recent Labs  Lab 05/24/21 0742  LIPASE 32    No results for input(s): AMMONIA in the last 168 hours.  CBC: Recent Labs  Lab 05/24/21 0455 05/26/21 0448 05/27/21 0507  WBC 8.2 13.8* 13.9*  HGB 9.7* 12.2 12.0  HCT 29.3* 35.6* 34.8*  MCV 93.0 92.0 92.6  PLT 245 259 236     Cardiac Enzymes: No results for input(s): CKTOTAL, CKMB, CKMBINDEX, TROPONINI in the last 168 hours.  BNP: Invalid input(s): POCBNP  CBG: Recent Labs  Lab 05/26/21 0757 05/27/21 0717  GLUCAP 201* 237*     Microbiology: Results for orders placed or performed during the hospital encounter of 05/24/21  Resp Panel by RT-PCR (Flu A&B, Covid) Nasopharyngeal Swab     Status: None   Collection Time: 05/24/21  5:44 AM   Specimen: Nasopharyngeal Swab; Nasopharyngeal(NP) swabs in vial transport medium  Result Value Ref Range Status   SARS Coronavirus 2 by RT PCR NEGATIVE NEGATIVE Final    Comment: (NOTE) SARS-CoV-2 target nucleic acids are NOT DETECTED.  The SARS-CoV-2 RNA is generally detectable in upper respiratory specimens during the acute phase of infection. The lowest concentration of SARS-CoV-2 viral copies this assay can detect is 138 copies/mL. A negative result does not preclude SARS-Cov-2 infection  and should not be used as the sole basis for treatment or other patient management decisions. A negative result may occur with  improper specimen collection/handling, submission of specimen other than nasopharyngeal swab, presence of viral mutation(s) within the areas targeted by this assay, and inadequate number of viral copies(<138 copies/mL). A negative result must be combined with clinical observations, patient history, and epidemiological information. The expected result is Negative.  Fact Sheet for Patients:   EntrepreneurPulse.com.au  Fact Sheet for Healthcare Providers:  IncredibleEmployment.be  This test is no t yet approved or cleared by the [place] FDA and  has been authorized for detection and/or diagnosis of SARS-CoV-2 by FDA under an Emergency Use Authorization (EUA). This EUA will remain  in effect (meaning this test can be used) for the duration of the COVID-19 declaration under Section 564(b)(1) of the Act, 21 U.S.C.section 360bbb-3(b)(1), unless the authorization is terminated  or revoked sooner.       Influenza A by PCR NEGATIVE NEGATIVE Final   Influenza B by PCR NEGATIVE NEGATIVE Final    Comment: (NOTE) The Xpert Xpress SARS-CoV-2/FLU/RSV plus assay is intended as an aid in the diagnosis of influenza from Nasopharyngeal swab specimens and should not be used as a sole basis for treatment. Nasal washings and aspirates are unacceptable for Xpert Xpress SARS-CoV-2/FLU/RSV testing.  Fact Sheet for Patients: EntrepreneurPulse.com.au  Fact Sheet for Healthcare Providers: IncredibleEmployment.be  This test is not yet approved or cleared by the [place] FDA and has been authorized for detection and/or diagnosis of SARS-CoV-2 by FDA under an Emergency Use Authorization (EUA). This EUA will remain in effect (meaning this test can be used) for the duration of the COVID-19 declaration under Section 564(b)(1) of the Act, 21 U.S.C. section 360bbb-3(b)(1), unless the authorization is terminated or revoked.  Performed at [facility], [address] [place], [place] [address]     Coagulation Studies: Recent Labs    05/25/21 0506 05/26/21 0742 05/27/21 0507  LABPROT 23.6* 21.9* 23.9*  INR 2.1* 1.9* 2.1*     Urinalysis: No results for input(s): COLORURINE, LABSPEC, PHURINE, GLUCOSEU, HGBUR, BILIRUBINUR, KETONESUR, PROTEINUR, UROBILINOGEN, NITRITE, LEUKOCYTESUR in the last 72  hours.  Invalid input(s): APPERANCEUR    Imaging: ECHOCARDIOGRAM COMPLETE  Result Date: 05/26/2021    ECHOCARDIOGRAM REPORT   Patient Name:   [name] Date of Exam: 05/26/2021 Medical Rec #:  [id number]      Height:       58.0 in Accession #:    [id number]     Weight:       110.2 lb Date of Birth:  Feb 10, 1941      BSA:          1.414 m Patient Age:    33 years       BP:           115/65 mmHg Patient Gender: F              HR:           125 bpm. Exam Location:  [facility] Procedure: 2D Echo, Color Doppler and Cardiac Doppler Indications:     I35.9 Aortic valve disorder  History:         Patient has prior history of Echocardiogram examinations, most                  recent 12/24/2020. CHF, CKD, Arrythmias:Atrial Fibrillation; Risk  Factors:Hypertension, Diabetes and Dyslipidemia.  Sonographer:     Charmayne Sheer RDCS (AE) Referring Phys:  [id number] Arvil Chaco Diagnosing Phys: Ida Rogue MD  Sonographer Comments: Global longitudinal strain was attempted. IMPRESSIONS  1. Left ventricular ejection fraction, by estimation, is 55 %. The left ventricle has normal function. The left ventricle has no regional wall motion abnormalities. There is mild left ventricular hypertrophy. Left ventricular diastolic parameters are indeterminate. The average left ventricular global longitudinal strain is -5.9 %. The global longitudinal strain is abnormal.  2. Right ventricular systolic function is normal. The right ventricular size is normal.  3. The mitral valve is normal in structure. Mild mitral valve regurgitation. No evidence of mitral stenosis.  4. The aortic valve is moderately calcified. Aortic valve regurgitation is mild to moderate. Mild aortic valve stenosis. FINDINGS  Left Ventricle: Left ventricular ejection fraction, by estimation, is 55 %. The left ventricle has normal function. The left ventricle has no regional wall motion abnormalities. The average left ventricular global longitudinal strain is  -5.9 %. The global longitudinal strain is abnormal. The left ventricular internal cavity size was normal in size. There is mild left ventricular hypertrophy. Left ventricular diastolic parameters are indeterminate. Right Ventricle: The right ventricular size is normal. No increase in right ventricular wall thickness. Right ventricular systolic function is normal. Left Atrium: Left atrial size was normal in size. Right Atrium: Right atrial size was normal in size. Pericardium: There is no evidence of pericardial effusion. Mitral Valve: The mitral valve is normal in structure. Mild mitral annular calcification. Mild mitral valve regurgitation. No evidence of mitral valve stenosis. Tricuspid Valve: The tricuspid valve is normal in structure. Tricuspid valve regurgitation is not demonstrated. No evidence of tricuspid stenosis. Aortic Valve: The aortic valve is normal in structure. There is moderate calcification of the aortic valve. Aortic valve regurgitation is mild to moderate. Aortic regurgitation PHT measures 404 msec. Mild aortic stenosis is present. Aortic valve mean gradient measures 7.7 mmHg. Aortic valve peak gradient measures 14.1 mmHg. Aortic valve area, by VTI measures 1.15 cm. Pulmonic Valve: The pulmonic valve was normal in structure. Pulmonic valve regurgitation is not visualized. No evidence of pulmonic stenosis. Aorta: The aortic root is normal in size and structure. Venous: The inferior vena cava is normal in size with greater than 50% respiratory variability, suggesting right atrial pressure of 3 mmHg. IAS/Shunts: No atrial level shunt detected by color flow Doppler.  LEFT VENTRICLE PLAX 2D LVIDd:         3.70 cm  Diastology LVIDs:         3.10 cm  LV e' medial:    7.40 cm/s LV PW:         1.00 cm  LV E/e' medial:  9.9 LV IVS:        0.80 cm  LV e' lateral:   11.00 cm/s LVOT diam:     1.80 cm  LV E/e' lateral: 6.6 LV SV:         38 LV SV Index:   27       2D Longitudinal Strain LVOT Area:     2.54  cm 2D Strain GLS Avg:     -5.9 %  RIGHT VENTRICLE RV Basal diam:  2.90 cm LEFT ATRIUM             Index       RIGHT ATRIUM           Index LA diam:  3.90 cm 2.76 cm/m  RA Area:     14.20 cm LA Vol (A2C):   52.2 ml 36.92 ml/m RA Volume:   28.20 ml  19.95 ml/m LA Vol (A4C):   48.0 ml 33.95 ml/m LA Biplane Vol: 52.5 ml 37.14 ml/m  AORTIC VALVE                    PULMONIC VALVE AV Area (Vmax):    1.19 cm     PV Vmax:       0.66 m/s AV Area (Vmean):   1.23 cm     PV Vmean:      47.400 cm/s AV Area (VTI):     1.15 cm     PV VTI:        0.111 m AV Vmax:           187.67 cm/s  PV Peak grad:  1.7 mmHg AV Vmean:          128.667 cm/s PV Mean grad:  1.0 mmHg AV VTI:            0.331 m AV Peak Grad:      14.1 mmHg AV Mean Grad:      7.7 mmHg LVOT Vmax:         87.50 cm/s LVOT Vmean:        62.300 cm/s LVOT VTI:          0.150 m LVOT/AV VTI ratio: 0.45 AI PHT:            404 msec  AORTA Ao Root diam: 2.50 cm MITRAL VALVE MV Area (PHT): 6.47 cm    SHUNTS MV Decel Time: 117 msec    Systemic VTI:  0.15 m MV E velocity: 73.00 cm/s  Systemic Diam: 1.80 cm Ida Rogue MD Electronically signed by Ida Rogue MD Signature Date/Time: 05/26/2021/1:54:02 PM    Final      Medications:     atorvastatin  10 mg Oral QPM   citalopram  40 mg Oral Daily   donepezil  5 mg Oral Daily   ferrous sulfate  325 mg Oral Q breakfast   insulin aspart  0-5 Units Subcutaneous QHS   insulin aspart  0-9 Units Subcutaneous TID WC   linaclotide  145 mcg Oral QAC breakfast   metoprolol tartrate  75 mg Oral BID   pantoprazole  40 mg Oral BID   rOPINIRole  0.5 mg Oral QPM   cyanocobalamin  1,000 mcg Oral Daily   warfarin  2 mg Oral ONCE-1600   Warfarin - Pharmacist Dosing Inpatient   Does not apply q1600   acetaminophen (TYLENOL) oral liquid 160 mg/5 mL, albuterol, dextromethorphan-guaiFENesin, hydrALAZINE, melatonin, nitroGLYCERIN, ondansetron (ZOFRAN) IV, traMADol  Assessment/ Plan:  [name] is a 80 y.o.   female with past medical history of anemia, Atrial Fib, CHD, CHF diabetes, and hypertension. Patient reports having trouble breathing and having excess fluid. Patient will be admitted for Acute on chronic diastolic congestive heart failure (HCC) [I50.33] Acute on chronic congestive heart failure, unspecified heart failure type ([place]) [I50.9]   Chronic kidney disease stage 4 with creatinine better than baseline, IV Lasix held this am due to increased creatinine Will start patient on po Lasix 40 mg daily Will continue to monitor labs  Will need office follow up after discharge.     Lab Results  Component Value Date   CREATININE 2.97 (H) 05/27/2021   CREATININE 2.44 (H) 05/26/2021   CREATININE 2.13 (H)  05/25/2021    Intake/Output Summary (Last 24 hours) at 05/27/2021 1303 Last data filed at 05/27/2021 1127 Gross per 24 hour  Intake 600 ml  Output 2475 ml  Net -1875 ml    2. Acute respiratory distress likely secondary to acute exacerbation of heart failure. Room air, UOP 1.3L in past 24 hours.  Resume home dosing of Furosemide 40 mg    3. Hypertension:116/75 Home medications include Hydralazine, resumed    LOS: 3   8/5/20221:03 PM

## 2021-05-27 NOTE — Progress Notes (Addendum)
ANTICOAGULATION CONSULT NOTE - Initial Consult  Pharmacy Consult for Warfarin management Indication: atrial fibrillation  No Known Allergies  Vital Signs: Temp: 98.2 F (36.8 C) (08/05 0515) Temp Source: Oral (08/05 0515)  Labs: Recent Labs    05/24/21 0742 05/25/21 0506 05/26/21 0448 05/26/21 0742 05/27/21 0507  HGB  --   --  12.2  --  12.0  HCT  --   --  35.6*  --  34.8*  PLT  --   --  259  --  236  LABPROT  --  23.6*  --  21.9* 23.9*  INR  --  2.1*  --  1.9* 2.1*  CREATININE  --  2.13* 2.44*  --  2.97*  TROPONINIHS 16  --   --   --   --      Medical History: Past Medical History:  Diagnosis Date   Anemia    Atrial fibrillation (HCC)    Chronic kidney disease    Congestive heart failure (CHF) (HCC)    Diabetes mellitus without complication (HCC)    GERD (gastroesophageal reflux disease)    Hyperlipidemia    Hypertension    Pneumonia    Pulmonary fibrosis (HCC)     Medications:  Scheduled:   atorvastatin  10 mg Oral QPM   citalopram  40 mg Oral Daily   donepezil  5 mg Oral Daily   ferrous sulfate  325 mg Oral Q breakfast   furosemide  40 mg Intravenous Q12H   linaclotide  145 mcg Oral QAC breakfast   metoprolol tartrate  25 mg Oral Q6H   pantoprazole  40 mg Oral BID   rOPINIRole  0.5 mg Oral QPM   cyanocobalamin  1,000 mcg Oral Daily   Warfarin - Pharmacist Dosing Inpatient   Does not apply q1600    Assessment: 80YOF on anticoagulation for atrial fibrillation. Admitted on 8/2 with chest pain and shortness of breath. PMH includes HFpEF, CKD, HTN, HLD, pulmonary fibrosis, and atrial fibrillation on warfarin at home, taking warfarin 2 mg every day, except 4 mg on Monday Wednesday and Friday. Pharmacy has been consulted for warfarin management.   Scr continues to increase, no up to 2.97 from 2.44 yesterday. Hgb stable. No new or current bleeding per notes.   Patient home regimen:  4 mg on Mon/Wed/Sat; 2 mg on all other days  Pt last dose was 4 mg on  05/23/21  08/02 INR 2.3  therapeutic > 2mg  08/03 INR 2.1  therapeutic > 4 mg 08/04 INR 1.9  subtherapeutic > 4 mg  08/05 INR 2.1 therapeutic > 2 mg  Goal of Therapy:  INR 2-3    Plan:  INR 2.1 therapeutic, trending up after two days of 4 mg. However, have not yet seen the full effects. Anticipate INR to increase tomorrow. Will give warfarin 2 mg.  Check INR daily with AM labs Monitor CBC per protocol   Wynelle Cleveland, PharmD Clinical Pharmacist 05/27/2021 6:57 AM

## 2021-05-27 NOTE — TOC Initial Note (Signed)
Transition of Care [facility]) - Initial/Assessment Note    Patient Details  Name: [name] MRN: [id number] Date of Birth: [birth year]  [facility]) CM/SW Contact:    Magnus Ivan, LCSW Phone Number: 05/27/2021, 2:35 PM  Clinical Narrative:               Completed high risk screening with patient. Patient lives with her daughter who provides transportation. PCP is Dr. Humphrey Rolls. Pharmacy is [facility]. Patient has a cane and RW. Patient went to SNF in past, could not recall where she went. Patient denies needs at this time. TOC to follow for needs.    Expected Discharge Plan: Home/Self Care Barriers to Discharge: Continued Medical Work up   Patient Goals and CMS Choice Patient states their goals for this hospitalization and ongoing recovery are:: home with daughter CMS Medicare.gov Compare Post Acute Care list provided to:: Patient Choice offered to / list presented to : Patient  Expected Discharge Plan and Services Expected Discharge Plan: Home/Self Care       Living arrangements for the past 2 months: Single Family Home                                      Prior Living Arrangements/Services Living arrangements for the past 2 months: Single Family Home Lives with:: Adult Children Patient language and need for interpreter reviewed:: Yes Do you feel safe going back to the place where you live?: Yes      Need for Family Participation in Patient Care: Yes (Comment) Care giver support system in place?: Yes (comment) Current home services: DME Criminal Activity/Legal Involvement Pertinent to Current Situation/Hospitalization: No - Comment as needed  Activities of Daily Living Home Assistive Devices/Equipment: Cane (specify quad or straight) ADL Screening (condition at time of admission) Patient's cognitive ability adequate to safely complete daily activities?: Yes Is the patient deaf or have difficulty hearing?: No Does the patient have  difficulty seeing, even when wearing glasses/contacts?: Yes Does the patient have difficulty concentrating, remembering, or making decisions?: No Patient able to express need for assistance with ADLs?: Yes Does the patient have difficulty dressing or bathing?: No Independently performs ADLs?: Yes (appropriate for developmental age) Does the patient have difficulty walking or climbing stairs?: Yes Weakness of Legs: Left Weakness of Arms/Hands: None  Permission Sought/Granted Permission sought to share information with : Facility Sport and exercise psychologist, Family Supports Permission granted to share information with : Yes, Verbal Permission Granted     Permission granted to share info w AGENCY: [facility], DME if needed  Permission granted to share info w Relationship: daughter     Emotional Assessment       Orientation: : Oriented to Self, Oriented to Place, Oriented to  Time, Oriented to Situation Alcohol / Substance Use: Not Applicable Psych Involvement: No (comment)  Admission diagnosis:  Acute on chronic diastolic congestive heart failure (HCC) [I50.33] Acute on chronic congestive heart failure, unspecified heart failure type [facility]) [I50.9] Patient Active Problem List   Diagnosis Date Noted   Acute on chronic diastolic congestive heart failure ([place]) 05/24/2021   Type II diabetes mellitus with renal manifestations ([place]) 05/24/2021   HLD (hyperlipidemia) 05/24/2021   Chest pain 05/24/2021   Depression 05/24/2021   Legally blind 05/24/2021   Abdominal pain 05/24/2021   Chronic respiratory failure with hypoxia ([place]) 03/16/2021   SOB (shortness of breath) 03/15/2021   Acute renal  failure superimposed on stage 4 chronic kidney disease ([place]) 02/08/2021   Anemia associated with stage 4 chronic renal failure ([place]) 02/08/2021   Anemia due to stage 5 chronic kidney disease ([place]) 02/07/2021   Acute CHF (congestive heart failure) ([place]) 02/06/2021   Acute on chronic diastolic CHF (congestive heart  failure) ([place]) 02/06/2021   Hyperkalemia 02/06/2021   Weakness 12/24/2020   Hypertensive urgency 12/23/2020   Nicotine dependence 12/23/2020   History of anemia due to chronic kidney disease 12/23/2020   Pain due to onychomycosis of toenails of both feet 02/12/2020   Uncontrolled type 2 diabetes mellitus with hyperglycemia ([place]) 05/20/2019   Acute recurrent pansinusitis 05/11/2019   Acute otitis media 05/11/2019   Acute pain of left shoulder 05/11/2019   Long term (current) use of anticoagulants 04/03/2019   Chronic left shoulder pain 03/18/2019   Sepsis ([place]) 01/12/2018   Colitis 01/12/2018   CKD (chronic kidney disease), stage III ([place]) 01/12/2018   Diabetes ([place]) 12/28/2017   AF (paroxysmal atrial fibrillation) ([place]) 12/28/2017   Encounter for general adult medical examination with abnormal findings 12/28/2017   Primary generalized (osteo)arthritis 12/28/2017   Hypertension 12/27/2017   GERD (gastroesophageal reflux disease) 12/27/2017   Hematuria 03/18/2015   Chronic combined systolic and diastolic heart failure ([place]) 03/01/2012   Obesity 03/01/2012   CKD stage 4 due to type 2 diabetes mellitus ([place]) 03/01/2012   Other benign neoplasm of connective and other soft tissue of lower limb, including hip 03/15/2011   Neoplasm of connective tissue 02/01/2011   Pain in joint, pelvic region and thigh 02/01/2011   Type 2 diabetes mellitus with stage 5 chronic kidney disease ([place]) 10/03/2006   Encounter for current long-term use of anticoagulants 02/07/2006   Essential hypertension 09/24/2004   Atrial fibrillation ([place]) 06/07/2004   PCP:  Lavera Guise, MD Pharmacy:   [facility] - [place], [place] [place] [address] Phone: [phone] Fax: [phone]  [facility] [address]), [place] - [place]) [place]  [address] Phone: [phone] Fax: [phone]     Social Determinants of Health (SDOH) Interventions    Readmission Risk Interventions Readmission Risk Prevention Plan 05/27/2021  Transportation Screening Complete  Medication Review ([place]) Complete  PCP or Specialist appointment within 3-5 days of discharge Complete  HRI or Home Care Consult Complete  SW Recovery Care/Counseling Consult Complete  Palliative Care Screening Complete  Skilled Nursing Facility Complete  Some recent data might be hidden

## 2021-05-28 DIAGNOSIS — I480 Paroxysmal atrial fibrillation: Secondary | ICD-10-CM | POA: Diagnosis not present

## 2021-05-28 DIAGNOSIS — I5033 Acute on chronic diastolic (congestive) heart failure: Secondary | ICD-10-CM | POA: Diagnosis not present

## 2021-05-28 LAB — CBC
HCT: 33.7 % — ABNORMAL LOW (ref 36.0–46.0)
Hemoglobin: 12.1 g/dL (ref 12.0–15.0)
MCH: 33.2 pg (ref 26.0–34.0)
MCHC: 35.9 g/dL (ref 30.0–36.0)
MCV: 92.6 fL (ref 80.0–100.0)
Platelets: 229 10*3/uL (ref 150–400)
RBC: 3.64 MIL/uL — ABNORMAL LOW (ref 3.87–5.11)
RDW: 14.5 % (ref 11.5–15.5)
WBC: 11.4 10*3/uL — ABNORMAL HIGH (ref 4.0–10.5)
nRBC: 0 % (ref 0.0–0.2)

## 2021-05-28 LAB — GLUCOSE, CAPILLARY
Glucose-Capillary: 171 mg/dL — ABNORMAL HIGH (ref 70–99)
Glucose-Capillary: 200 mg/dL — ABNORMAL HIGH (ref 70–99)
Glucose-Capillary: 136 mg/dL — ABNORMAL HIGH (ref 70–99)
Glucose-Capillary: 140 mg/dL — ABNORMAL HIGH (ref 70–99)

## 2021-05-28 LAB — BASIC METABOLIC PANEL
Anion gap: 10 (ref 5–15)
BUN: 87 mg/dL — ABNORMAL HIGH (ref 8–23)
CO2: 28 mmol/L (ref 22–32)
Calcium: 8.6 mg/dL — ABNORMAL LOW (ref 8.9–10.3)
Chloride: 95 mmol/L — ABNORMAL LOW (ref 98–111)
Creatinine, Ser: 2.61 mg/dL — ABNORMAL HIGH (ref 0.44–1.00)
GFR, Estimated: 18 mL/min — ABNORMAL LOW (ref >60)
Glucose, Bld: 189 mg/dL — ABNORMAL HIGH (ref 70–99)
Potassium: 3.8 mmol/L (ref 3.5–5.1)
Sodium: 133 mmol/L — ABNORMAL LOW (ref 135–145)

## 2021-05-28 LAB — PROTIME-INR
INR: 2.5 — ABNORMAL HIGH (ref 0.8–1.2)
Prothrombin Time: 27.2 seconds — ABNORMAL HIGH (ref 11.4–15.2)

## 2021-05-28 MED ORDER — POLYETHYLENE GLYCOL 3350 17 G PO PACK
17.0000 g | PACK | Freq: Every day | ORAL | Status: DC
  Administered 2021-05-29 – 2021-05-30 (×2): 17 g via ORAL
  Filled 2021-05-28 (×3): qty 1

## 2021-05-28 NOTE — Evaluation (Signed)
Occupational Therapy Evaluation Patient Details Name: [name] MRN: [id number] DOB: [birth year] Today's Date: 05/28/2021    History of Present Illness Pt is an 80 y/o F with PMH: OA, CKD, GERD, DM, CHF and is legally blind who presents to ED d/t acute weight increase, feeling bloated, and cough. Pt found to be volume overloaded in setting of acute CHF exacerbation.   Clinical Impression   Pt seen for OT evaluation this date in setting of acute hospitalization d/t CHF exacerbation. Pt reports being INDEP/MOD I for basic self care at baseline and requiring family assist for IADLs such as meal prep and errands. Pt reports use of SPC for fxl mobility. Pt presents this date with decreased fxl activity tolerance, decreased strength, and general deconditioning, impacting her ability to safely and efficiently perform ADLs/ADL mobility. On ADL assessment, pt currently requires: SETUP for seated UB ADLs, MOD A for standing LB ADLs including peri care d/t weakness. Pt requires MIN/MOD A for STS transfers with RW and MOD A for SPS transfers. Note: Pt appears to start to have vaso-vagal response while seated on commode, OT elevates LEs and encourages pumping ankles while also supporting pt to lean back to improve circulation. Pt is noted to be somewhat clammy, she is fanned while she is resting with her LEs up and after ~30seconds to 1 minute, she is looking better and reports the light headedness is dissipating. OT called CNA who assisted by bringing recliner into restroom and pt is transferred to chair rather than performing fxl mobility back to bed for saftey. Pt's initial STS only required CGA to possibly MIN A, but pt appears to demo some fatigue potentially r/t the episode while bearing down to have BM. In any case, all subsequent transfers were MIN/MOD A to MOD A. RN notified of session contents. Will continue to follow. Pt with good social support, recommend HHOT f/u to ensure safety in natural environment  with ADLs.     Follow Up Recommendations  Home health OT;Supervision/Assistance - 24 hour    Equipment Recommendations  3 in 1 bedside commode;Tub/shower seat;Other (comment) (2ww)    Recommendations for Other Services       Precautions / Restrictions Precautions Precautions: Fall Restrictions Weight Bearing Restrictions: No Other Position/Activity Restrictions: possible vaso-vagal response when on commode, RN notified.      Mobility Bed Mobility Overal bed mobility: Modified Independent                  Transfers Overall transfer level: Needs assistance Equipment used: Rolling walker (2 wheeled)             General transfer comment: CGA/MIN A for initial CTS from EOB with RW, able to perform fxl mobility to restroom, after seemingly possible vaso-vagal episode while on commode, pt requires MIN/MOD A to come to standing and MOD A for stand-pivot transfers.    Balance Overall balance assessment: Needs assistance   Sitting balance-Leahy Scale: Good Sitting balance - Comments: G static     Standing balance-Leahy Scale: Poor Standing balance comment: initially F standing balance, but becomes seemingly weaker when fatigued or possiby r/t potential vaso-vagal episode on commode. Pt is noted to then demo LE bucklnig at which time, no further fxl mobility performed and pt is assisted with MOD A with P standing balance to trnasfer into recliner (OT used called light to ask for assitance from CNA to bring recliner to restroom). Pt safely to chair with LEs eleavted and reporting dizziness decreased. Pt  ADL either performed or assessed with clinical judgement   ADL Overall ADL's : Needs assistance/impaired                                       General ADL Comments: requires SETUP for seated UB ADLs, MOD A for standing LB ADLs including peri care d/t weakness. Pt requires MIN/MOD A for STS transfers with RW and MOD A  for SPS transfers. Pt appears to start to have vaso-vagal response while seated on commode, OT elevates LEs and encourages pumping ankles while also supporting pt to lean back to improve circulation. Pt is noted to be somewhat clammy, she is fanned while she is resting with her LEs up and after ~30seconds to 1 minute, she is looking better and reports the light headedness is dissipating. CNA called and assisted by bringing recliner into restroom and pt is transferred to chair rather than performing fxl mobility back to bed for saftey.     Vision Patient Visual Report: No change from baseline Additional Comments: legally blind, no change from baseline, states she can she shapes/outlines, shadows/light     Perception     Praxis      Pertinent Vitals/Pain Pain Assessment: No/denies pain     Hand Dominance Right   Extremity/Trunk Assessment Upper Extremity Assessment Upper Extremity Assessment: Generalized weakness   Lower Extremity Assessment Lower Extremity Assessment: Generalized weakness       Communication Communication Communication: HOH   Cognition Arousal/Alertness: Awake/alert Behavior During Therapy: WFL for tasks assessed/performed Overall Cognitive Status: Within Functional Limits for tasks assessed                                 General Comments: oriented and appropriate conversationally, some impulsivity, but could be r/t [facility]   General Comments       Exercises Other Exercises Other Exercises: OT facilitates ed re: role of OT, use of call button, chair alarm, fall prevention considerations, ways to help improve circulation, importance of not bearing down hard while seated on commode attempting BM. Pt with moderate reception, will require f/u.   Shoulder Instructions      Home Living Family/patient expects to be discharged to:: Private residence Living Arrangements: Children Available Help at Discharge: Family;Available 24 hours/day (dtr or  grandchildren, someone is always with patient) Type of Home: Mobile home Home Access: Ramped entrance     Home Layout: One level     Bathroom Shower/Tub: Occupational psychologist: Standard     Home Equipment: Cane - single point;Walker - 2 wheels   Additional Comments: reports she uses SPC primarily      Prior Functioning/Environment Level of Independence: Needs assistance  Gait / Transfers Assistance Needed: pt reports ambulating with rollator household distances requiring some assist for transfers and sometimes to walk, reports using [facility] for doctors appointments ADL's / Homemaking Assistance Needed: pt reports indep with showers, dressing, toileting, and gets assist from dtr for driving, med mgt, meals, cleaning, and laundry   Comments: reports no recent falls        OT Problem List: Decreased strength;Decreased activity tolerance;Impaired balance (sitting and/or standing);Cardiopulmonary status limiting activity      OT Treatment/Interventions: Self-care/ADL training;Therapeutic activities;Therapeutic exercise;Patient/family education    OT Goals(Current goals can be found in the care plan section) Acute Rehab OT Goals Patient  Stated Goal: to start feeling more normal and go home OT Goal Formulation: With patient Time For Goal Achievement: 06/11/21 Potential to Achieve Goals: Good ADL Goals Pt Will Perform Lower Body Dressing: with supervision;with set-up;sit to/from stand (with LRAD/AE PRN) Pt Will Transfer to Toilet: with supervision;ambulating (with LRAD to/from restroom with MIN verbal cues to negotiate unfamiliar environment) Pt Will Perform Toileting - Clothing Manipulation and hygiene: with supervision;sit to/from stand  OT Frequency: Min 1X/week   Barriers to D/C:            Co-evaluation              AM-PAC OT "6 Clicks" Daily Activity     Outcome Measure Help from another person eating meals?: None Help from another person taking care of  personal grooming?: None Help from another person toileting, which includes using toliet, bedpan, or urinal?: A Lot Help from another person bathing (including washing, rinsing, drying)?: A Lot Help from another person to put on and taking off regular upper body clothing?: A Little Help from another person to put on and taking off regular lower body clothing?: A Little 6 Click Score: 18   End of Session Equipment Utilized During Treatment: Gait belt;Rolling walker Nurse Communication: Mobility status;Other (comment) (clammy, light headed while on commode, possible VV)  Activity Tolerance: Patient tolerated treatment well Patient left: in bed;with call bell/phone within reach  OT Visit Diagnosis: Unsteadiness on feet (R26.81);Muscle weakness (generalized) (M62.81)                Time: 2035-5974 OT Time Calculation (min): 36 min Charges:  OT General Charges $OT Visit: 1 Visit OT Evaluation $OT Eval Moderate Complexity: 1 Mod OT Treatments $Self Care/Home Management : 8-22 mins $Therapeutic Activity: 8-22 mins  Gerrianne Scale, MS, OTR/L ascom [phone] 05/28/21, 2:54 PM

## 2021-05-28 NOTE — Progress Notes (Signed)
[facility]  ROUNDING NOTE   Subjective:   [name]  is a 80 y.o. female with past medical history of anemia, Atrial Fib, CHD, CHF diabetes, and hypertension. Patient reports having trouble breathing and having excess fluid. Patient will be admitted for Acute on chronic diastolic congestive heart failure (HCC) [I50.33] Acute on chronic congestive heart failure, unspecified heart failure type [facility]) [I50.9]  Patient is known to our practice and currently sees Dr Juleen China.   Patient sitting at the side of bed, eating breakfast Denies nausea and vomiting Denies shortness of breath   Objective:  Vital signs in last 24 hours:  Temp:  [97.8 F (36.6 C)-98.7 F (37.1 C)] 98.7 F (37.1 C) (08/06 0815) Pulse Rate:  [56-95] 95 (08/06 0815) Resp:  [12-21] 12 (08/06 0815) BP: (107-120)/(64-85) 111/85 (08/06 0815) SpO2:  [99 %] 99 % (08/06 0815) Weight:  [56.1 kg] 56.1 kg (08/06 0500)  Weight change: 6.065 kg Filed Weights   05/26/21 0440 05/27/21 0338 05/28/21 0500  Weight: 50 kg 50 kg 56.1 kg    Intake/Output: I/O last 3 completed shifts: In: 600 [P.O.:600] Out: 1540 [Urine:1275]   Intake/Output this shift:  No intake/output data recorded.  Physical Exam: General: NAD, sitting bedside  Head: Normocephalic, atraumatic. Moist oral mucosal membranes  Eyes: Anicteric  Lungs:  Clear to auscultation, normal effort  Heart: Regular rate and rhythm  Abdomen:  Soft, nontender  Extremities:  no peripheral edema.  Neurologic: Nonfocal, moving all four extremities  Skin: No lesions       Basic Metabolic Panel: Recent Labs  Lab 05/24/21 0455 05/25/21 0506 05/26/21 0448 05/27/21 0507 05/28/21 0456  NA 136 134* 135 135 133*  K 3.8 3.9 3.9 3.9 3.8  CL 104 96* 93* 95* 95*  CO2 25 30 32 29 28  GLUCOSE 196* 160* 185* 203* 189*  BUN 47* 56* 64* 92* 87*  CREATININE 1.94* 2.13* 2.44* 2.97* 2.61*  CALCIUM 8.7* 8.9 8.7* 8.6* 8.6*  MG  --  2.1  --   --   --       Liver Function Tests: No results for input(s): AST, ALT, ALKPHOS, BILITOT, PROT, ALBUMIN in the last 168 hours. Recent Labs  Lab 05/24/21 0742  LIPASE 32    No results for input(s): AMMONIA in the last 168 hours.  CBC: Recent Labs  Lab 05/24/21 0455 05/26/21 0448 05/27/21 0507 05/28/21 0456  WBC 8.2 13.8* 13.9* 11.4*  HGB 9.7* 12.2 12.0 12.1  HCT 29.3* 35.6* 34.8* 33.7*  MCV 93.0 92.0 92.6 92.6  PLT 245 259 236 229     Cardiac Enzymes: No results for input(s): CKTOTAL, CKMB, CKMBINDEX, TROPONINI in the last 168 hours.  BNP: Invalid input(s): POCBNP  CBG: Recent Labs  Lab 05/27/21 0717 05/27/21 1630 05/27/21 2103 05/28/21 0832 05/28/21 1145  GLUCAP 237* 181* 165* 171* 200*     Microbiology: Results for orders placed or performed during the hospital encounter of 05/24/21  Resp Panel by RT-PCR (Flu A&B, Covid) Nasopharyngeal Swab     Status: None   Collection Time: 05/24/21  5:44 AM   Specimen: Nasopharyngeal Swab; Nasopharyngeal(NP) swabs in vial transport medium  Result Value Ref Range Status   SARS Coronavirus 2 by RT PCR NEGATIVE NEGATIVE Final    Comment: (NOTE) SARS-CoV-2 target nucleic acids are NOT DETECTED.  The SARS-CoV-2 RNA is generally detectable in upper respiratory specimens during the acute phase of infection. The lowest concentration of SARS-CoV-2 viral copies this assay can detect  is 138 copies/mL. A negative result does not preclude SARS-Cov-2 infection and should not be used as the sole basis for treatment or other patient management decisions. A negative result may occur with  improper specimen collection/handling, submission of specimen other than nasopharyngeal swab, presence of viral mutation(s) within the areas targeted by this assay, and inadequate number of viral copies(<138 copies/mL). A negative result must be combined with clinical observations, patient history, and epidemiological information. The expected result is  Negative.  Fact Sheet for Patients:  EntrepreneurPulse.com.au  Fact Sheet for Healthcare Providers:  IncredibleEmployment.be  This test is no t yet approved or cleared by the [place] FDA and  has been authorized for detection and/or diagnosis of SARS-CoV-2 by FDA under an Emergency Use Authorization (EUA). This EUA will remain  in effect (meaning this test can be used) for the duration of the COVID-19 declaration under Section 564(b)(1) of the Act, 21 U.S.C.section 360bbb-3(b)(1), unless the authorization is terminated  or revoked sooner.       Influenza A by PCR NEGATIVE NEGATIVE Final   Influenza B by PCR NEGATIVE NEGATIVE Final    Comment: (NOTE) The Xpert Xpress SARS-CoV-2/FLU/RSV plus assay is intended as an aid in the diagnosis of influenza from Nasopharyngeal swab specimens and should not be used as a sole basis for treatment. Nasal washings and aspirates are unacceptable for Xpert Xpress SARS-CoV-2/FLU/RSV testing.  Fact Sheet for Patients: EntrepreneurPulse.com.au  Fact Sheet for Healthcare Providers: IncredibleEmployment.be  This test is not yet approved or cleared by the [place] FDA and has been authorized for detection and/or diagnosis of SARS-CoV-2 by FDA under an Emergency Use Authorization (EUA). This EUA will remain in effect (meaning this test can be used) for the duration of the COVID-19 declaration under Section 564(b)(1) of the Act, 21 U.S.C. section 360bbb-3(b)(1), unless the authorization is terminated or revoked.  Performed at [facility], [address] [place], [place] [address]     Coagulation Studies: Recent Labs    05/26/21 0742 05/27/21 0507 05/28/21 0456  LABPROT 21.9* 23.9* 27.2*  INR 1.9* 2.1* 2.5*     Urinalysis: No results for input(s): COLORURINE, LABSPEC, PHURINE, GLUCOSEU, HGBUR, BILIRUBINUR, KETONESUR, PROTEINUR, UROBILINOGEN,  NITRITE, LEUKOCYTESUR in the last 72 hours.  Invalid input(s): APPERANCEUR    Imaging: No results found.   Medications:     atorvastatin  10 mg Oral QPM   citalopram  40 mg Oral Daily   donepezil  5 mg Oral Daily   ferrous sulfate  325 mg Oral Q breakfast   furosemide  40 mg Oral Daily   insulin aspart  0-5 Units Subcutaneous QHS   insulin aspart  0-9 Units Subcutaneous TID WC   linaclotide  145 mcg Oral QAC breakfast   metoprolol tartrate  75 mg Oral BID   pantoprazole  40 mg Oral BID   polyethylene glycol  17 g Oral Daily   rOPINIRole  0.5 mg Oral QPM   cyanocobalamin  1,000 mcg Oral Daily   Warfarin - Pharmacist Dosing Inpatient   Does not apply q1600   acetaminophen (TYLENOL) oral liquid 160 mg/5 mL, albuterol, dextromethorphan-guaiFENesin, melatonin, ondansetron (ZOFRAN) IV  Assessment/ Plan:  [name] is a 80 y.o.  female with past medical history of anemia, Atrial Fib, CHD, CHF diabetes, and hypertension. Patient reports having trouble breathing and having excess fluid. Patient will be admitted for Acute on chronic diastolic congestive heart failure (HCC) [I50.33] Acute on chronic congestive heart failure, unspecified heart failure  type [facility]) [I50.9]   Chronic kidney disease stage 4 with creatinine better than baseline, IV Lasix held this am due to increased creatinine on 05/28/21 po Lasix 40 mg daily Creatinine improving Will need office follow up after discharge.     Lab Results  Component Value Date   CREATININE 2.61 (H) 05/28/2021   CREATININE 2.97 (H) 05/27/2021   CREATININE 2.44 (H) 05/26/2021    Intake/Output Summary (Last 24 hours) at 05/28/2021 1320 Last data filed at 05/27/2021 1810 Gross per 24 hour  Intake 480 ml  Output --  Net 480 ml    2. Acute respiratory distress likely secondary to acute exacerbation of heart failure. Room air,  Resume home dosing of Furosemide 40 mg    3. Hypertension:111/85 Home medications include  Hydralazine, resumed    LOS: 4   8/6/20221:20 PM

## 2021-05-28 NOTE — Evaluation (Signed)
Physical Therapy Evaluation Patient Details Name: [name] MRN: [id number] DOB: 04-21-1941 Today's Date: 05/28/2021   History of Present Illness  Pt is an 80 y/o F with PMH: OA, CKD, GERD, DM, CHF and is legally blind who presents to ED d/t acute weight increase, feeling bloated, and cough. Pt found to be volume overloaded in setting of acute CHF exacerbation.  Clinical Impression  Pt received supine in bed, eyes closed and easily aroused; agreeable to therapy. Bed mobility performed safely, Mod I with bed rails. STS and ambulation with CGA to steady and for safety. Per OT this morning, pt may have experienced a vaso-vagal response while toileting. Symptoms were monitored throughout session. Pt did report lightheadedness during ambulation. Pt sat down in chair until symptoms cleared (1 minute) before ambulating back. She requested to return to bed due to sitting in the chair for 2 hours earlier in the day. Overall, she demo decreased activity tolerance and impaired stability in standing and during ambulation (possibly related to visual impairments). Would benefit from skilled PT to address above deficits and promote optimal return to PLOF.     Follow Up Recommendations Home health PT;Supervision/Assistance - 24 hour    Equipment Recommendations  None recommended by PT    Recommendations for Other Services       Precautions / Restrictions Precautions Precautions: Fall Restrictions Weight Bearing Restrictions: No Other Position/Activity Restrictions: Lightheadedness with ambulation. OT reported possible vaso-vagal response while toileting that morning.      Mobility  Bed Mobility Overal bed mobility: Modified Independent             General bed mobility comments: increased time to complete    Transfers Overall transfer level: Needs assistance Equipment used: Straight cane Transfers: Sit to/from Stand Sit to Stand: Min guard         General transfer comment: STS x 2  reps. CGA to steady upon initial standing, pt used [facility]  Ambulation/Gait Ambulation/Gait assistance: Min guard Gait Distance (Feet): 20 Feet Assistive device: Straight cane Gait Pattern/deviations: Step-through pattern;Decreased stride length;Shuffle;Trunk flexed;Narrow base of support     General Gait Details: 22ft x 2 reps. Seated break midway due to report of lightheadedness. CGA to steady. Continous reaching and feeling for objects due to limited vision. VC to assist with comfort.  Stairs            Wheelchair Mobility    Modified Rankin (Stroke Patients Only)       Balance Overall balance assessment: Needs assistance   Sitting balance-Leahy Scale: Good Sitting balance - Comments: G static     Standing balance-Leahy Scale: Fair Standing balance comment: Pt demo fair balance during static standing and ambulation. Reaching for objects with L hand may contribute to decreased balance. CGA required to steady.                             Pertinent Vitals/Pain Pain Assessment: No/denies pain    Home Living Family/patient expects to be discharged to:: Private residence Living Arrangements: Children Available Help at Discharge: Family;Available 24 hours/day (dtr or grandchildren, someone is always with patient) Type of Home: Mobile home Home Access: Ramped entrance     Home Layout: One level Home Equipment: Cane - single point;Walker - 2 wheels Additional Comments: reports she uses SPC primarily    Prior Function Level of Independence: Needs assistance   Gait / Transfers Assistance Needed: pt reports ambulating with rollator household distances requiring  some assist for transfers and sometimes to walk, reports using [facility] for doctors appointments  ADL's / Homemaking Assistance Needed: pt reports indep with showers, dressing, toileting, and gets assist from dtr for driving, med mgt, meals, cleaning, and laundry  Comments: reports no recent falls      Hand Dominance   Dominant Hand: Right    Extremity/Trunk Assessment   Upper Extremity Assessment Upper Extremity Assessment: Defer to OT evaluation    Lower Extremity Assessment Lower Extremity Assessment: Generalized weakness (4/5 globally in BLE)    Cervical / Trunk Assessment Cervical / Trunk Assessment: Kyphotic  Communication   Communication: HOH  Cognition Arousal/Alertness: Awake/alert Behavior During Therapy: WFL for tasks assessed/performed Overall Cognitive Status: Within Functional Limits for tasks assessed                                 General Comments: A&Ox4      General Comments      Exercises Other Exercises Other Exercises: OT facilitates ed re: role of OT, use of call button, chair alarm, fall prevention considerations, ways to help improve circulation, importance of not bearing down hard while seated on commode attempting BM. Pt with moderate reception, will require f/u. Other Exercises: Pt educated on PT role, POC, safety regarding mobility, falls prevention.   Assessment/Plan    PT Assessment Patient needs continued PT services  PT Problem List Decreased strength;Decreased activity tolerance;Decreased safety awareness;Decreased balance;Decreased mobility       PT Treatment Interventions DME instruction;Balance training;Gait training;Neuromuscular re-education;Stair training;Functional mobility training;Patient/family education;Therapeutic activities;Therapeutic exercise    PT Goals (Current goals can be found in the Care Plan section)  Acute Rehab PT Goals Patient Stated Goal: to go home PT Goal Formulation: With patient Time For Goal Achievement: 06/11/21 Potential to Achieve Goals: Fair    Frequency Min 2X/week   Barriers to discharge        Co-evaluation               AM-PAC PT "6 Clicks" Mobility  Outcome Measure Help needed turning from your back to your side while in a flat bed without using bedrails?:  None Help needed moving from lying on your back to sitting on the side of a flat bed without using bedrails?: None Help needed moving to and from a bed to a chair (including a wheelchair)?: A Little Help needed standing up from a chair using your arms (e.g., wheelchair or bedside chair)?: A Little Help needed to walk in hospital room?: A Little Help needed climbing 3-5 steps with a railing? : A Little 6 Click Score: 20    End of Session Equipment Utilized During Treatment: Gait belt Activity Tolerance: Patient tolerated treatment well;Other (comment) (limited by lightheadedness) Patient left: in bed;with call bell/phone within reach;with bed alarm set   PT Visit Diagnosis: Unsteadiness on feet (R26.81);Other abnormalities of gait and mobility (R26.89);Muscle weakness (generalized) (M62.81);Difficulty in walking, not elsewhere classified (R26.2)    Time: 3335-4562 PT Time Calculation (min) (ACUTE ONLY): 18 min   Charges:   PT Evaluation $PT Eval Moderate Complexity: 1 Mod PT Treatments $Therapeutic Activity: 8-22 mins        Patrina Levering PT, DPT 05/28/21 5:35 PM [phone]   [facility] 05/28/2021, 5:35 PM

## 2021-05-28 NOTE — Progress Notes (Signed)
[place] for Warfarin management Indication: atrial fibrillation  No Known Allergies  Vital Signs: Temp: 98.7 F (37.1 C) (08/06 0815) Temp Source: Oral (08/06 0815) BP: 111/85 (08/06 0815) Pulse Rate: 95 (08/06 0815)  Labs: Recent Labs    05/26/21 0448 05/26/21 0742 05/27/21 0507 05/28/21 0456  HGB 12.2  --  12.0 12.1  HCT 35.6*  --  34.8* 33.7*  PLT 259  --  236 229  LABPROT  --  21.9* 23.9* 27.2*  INR  --  1.9* 2.1* 2.5*  CREATININE 2.44*  --  2.97* 2.61*     Medical History: Past Medical History:  Diagnosis Date   Anemia    Atrial fibrillation (HCC)    Chronic kidney disease    Congestive heart failure (CHF) (HCC)    Diabetes mellitus without complication (HCC)    GERD (gastroesophageal reflux disease)    Hyperlipidemia    Hypertension    Pneumonia    Pulmonary fibrosis (HCC)       Assessment: 80YOF on anticoagulation for atrial fibrillation. Admitted on 8/2 with chest pain and shortness of breath. PMH includes HFpEF, CKD, HTN, HLD, pulmonary fibrosis, and atrial fibrillation on warfarin at home, taking warfarin 2 mg every day, except 4 mg on Monday Wednesday and Friday. Pharmacy has been consulted for warfarin management.   Patient home regimen:  4 mg on Mon/Wed/Sat; 2 mg on all other days  Pt last dose was 4 mg on 05/23/21  08/02 INR 2.3  therapeutic > 2mg  08/03 INR 2.1  therapeutic > 4 mg 08/04 INR 1.9  subtherapeutic > 4 mg  08/05 INR 2.1 therapeutic > 2 mg 08/06 INR 2.5 therapeutic > 2 mg  Goal of Therapy:  INR 2-3    Plan:  INR 2.5 therapeutic, continues to trend up. Will give warfarin 2 mg today. Check INR daily with AM labs Monitor CBC per protocol   Tawnya Crook, PharmD, BCPS Clinical Pharmacist 05/28/2021 10:01 AM

## 2021-05-28 NOTE — Progress Notes (Signed)
Progress Note  Patient Name: [name] Date of Encounter: 05/28/2021  Primary Cardiologist: New to [facility] - consult by Dr. Garen Lah (previously evaluated by [facility] in 4 and 02/2021)  Subjective   Dyspnea improved and at her baseline. No chest pain, palpitations, dizziness, presyncope, or syncope. Nephrology placed her on oral Lasix 40 mg daily 8/5. Renal function improving off IV Lasix. Minimal documented UOP for the past 24 hours and net - 6.1 L for the admission. Weight 50 to 56.1 kg over the past 24 hours. Echo this admission performed yesterday showed a preserved LVSF, normal wall motion, normal RVSF, mild to moderate AI and mild aortic stenosis.   Inpatient Medications    Scheduled Meds:  atorvastatin  10 mg Oral QPM   citalopram  40 mg Oral Daily   donepezil  5 mg Oral Daily   ferrous sulfate  325 mg Oral Q breakfast   furosemide  40 mg Oral Daily   insulin aspart  0-5 Units Subcutaneous QHS   insulin aspart  0-9 Units Subcutaneous TID WC   linaclotide  145 mcg Oral QAC breakfast   metoprolol tartrate  75 mg Oral BID   pantoprazole  40 mg Oral BID   rOPINIRole  0.5 mg Oral QPM   cyanocobalamin  1,000 mcg Oral Daily   Warfarin - Pharmacist Dosing Inpatient   Does not apply q1600   Continuous Infusions:  PRN Meds: acetaminophen (TYLENOL) oral liquid 160 mg/5 mL, albuterol, dextromethorphan-guaiFENesin, melatonin, ondansetron (ZOFRAN) IV   Vital Signs    Vitals:   05/28/21 0450 05/28/21 0500 05/28/21 0600 05/28/21 0800  BP: 115/68   111/85  Pulse: (!) 56   77  Resp: 16  20 (!) 21  Temp: 97.8 F (36.6 C)   98.7 F (37.1 C)  TempSrc: Oral   Oral  SpO2: 99%   99%  Weight:  56.1 kg    Height:        Intake/Output Summary (Last 24 hours) at 05/28/2021 0845 Last data filed at 05/27/2021 1810 Gross per 24 hour  Intake 600 ml  Output 375 ml  Net 225 ml    Filed Weights   05/26/21 0440 05/27/21 0338 05/28/21 0500  Weight: 50 kg 50 kg 56.1 kg     Telemetry    Afib with rates in the 80s to 90s bpm predominantly with rare brief episodes into the 1-teens bpm - Personally Reviewed  ECG    No new tracings - Personally Reviewed  Physical Exam   GEN: No acute distress.   Neck: No JVD. Cardiac: IRIR, I/VI systolic murmur RUSB, no rubs, or gallops.  Respiratory: Clear to auscultation bilaterally.  GI: Soft, nontender, non-distended.   MS: No edema; No deformity. Neuro:  Alert and oriented x 3; Nonfocal.  Psych: Normal affect.  Labs    Chemistry Recent Labs  Lab 05/26/21 0448 05/27/21 0507 05/28/21 0456  NA 135 135 133*  K 3.9 3.9 3.8  CL 93* 95* 95*  CO2 32 29 28  GLUCOSE 185* 203* 189*  BUN 64* 92* 87*  CREATININE 2.44* 2.97* 2.61*  CALCIUM 8.7* 8.6* 8.6*  GFRNONAA 20* 15* 18*  ANIONGAP 10 11 10       Hematology Recent Labs  Lab 05/26/21 0448 05/27/21 0507 05/28/21 0456  WBC 13.8* 13.9* 11.4*  RBC 3.87 3.76* 3.64*  HGB 12.2 12.0 12.1  HCT 35.6* 34.8* 33.7*  MCV 92.0 92.6 92.6  MCH 31.5 31.9 33.2  MCHC 34.3 34.5 35.9  RDW 14.9 14.8 14.5  PLT 259 236 229     Cardiac EnzymesNo results for input(s): TROPONINI in the last 168 hours. No results for input(s): TROPIPOC in the last 168 hours.   BNP Recent Labs  Lab 05/24/21 0455  BNP 1,334.6*      DDimer No results for input(s): DDIMER in the last 168 hours.   Radiology    CT ABDOMEN PELVIS WO CONTRAST  Result Date: 05/24/2021 IMPRESSION: No acute abnormality within the abdomen or pelvis. Small bilateral pleural effusions and dependent subcutaneous edema suggestive of volume overload. Mild cardiomegaly. Aortic Atherosclerosis (ICD10-I70.0). Electronically Signed   By: Inge Rise M.D.   On: 05/24/2021 12:57    Cardiac Studies   2D echo 12/2020: 1. Left ventricular ejection fraction, by estimation, is 60 to 65%. The  left ventricle has normal function. The left ventricle has no regional  wall motion abnormalities. Left ventricular  diastolic parameters are  indeterminate.   2. Right ventricular systolic function is normal. The right ventricular  size is normal. There is normal pulmonary artery systolic pressure.   3. Left atrial size was mildly dilated.   4. There is moderate calcification of the aortic valve. Aortic valve  regurgitation is mild. Mild aortic valve stenosis.   5. The inferior vena cava is dilated in size with <50% respiratory  variability, suggesting right atrial pressure of 15 mmHg.   6. Left pleural effusion noted. __________  2D echo 05/26/2021: Pending  Patient Profile     80 y.o. female with history of HFpEF, PAF, DM2, HTN, pulmonary fibrosis, CKD stage IV, anemia of chronic disease, legal blindness, and tobacco use who we are seeing for acute on chronic HFpEF and A. fib with RVR.  Assessment & Plan    1.  Acute on chronic HFpEF: -Overall, she does not appear grossly volume overloaded though does appear to be retaining some fluid -IV Lasix held 8/5 with worsening renal function -Started on oral Lasix 40 mg daily 8/5 per nephrology with improving renal function this morning  -Echo as above -If needed, to further estimate hemodynamic status, could consider RHC -Volume status has likely exacerbated by A. fib with RVR -Daily weights -Strict I's and O's  2.  PAF with RVR: -Presented in sinus rhythm with development of A. fib with RVR on 05/25/2021 with ventricular rates up into the 130s bpm, currently well controlled in the 80s to 90s bpm -Continue Lopressor to 75 mg bid, could titrate if needed as BP allows -CHA2DS2-VASc at least 6 (CHF, HTN, age x2, diabetes, sex category) -INR 2.5 -Warfarin per pharmacy -HS-Tn negative x 2 -TSH normal  3.  Acute on CKD stage IV: -Improving this morning -PO Lasix per nephrology -Monitor with diuresis -Nephrology is following  4.  Anemia of chronic disease: -Hgb improved follow iron  5.  HTN: -Blood pressure stable -Continue medical therapy as  above  6.  HLD: -LDL 35 -PTA Lipitor    For questions or updates, please contact [place] Please consult [email or web address] for contact info under Cardiology/STEMI.    Signed, Christell Faith, PA-C [place] Pager: [phone] 05/28/2021, 8:45 AM

## 2021-05-28 NOTE — Progress Notes (Signed)
[place] [facility] [place] NAME: [name]    MR#:  [id number]  PCP: Lavera Guise, MD  DATE OF BIRTH:  Mar 17, 1941  SUBJECTIVE:  CHIEF COMPLAINT:   Chief Complaint  Patient presents with   Chest Pain   Shortness of Breath  Improving shortness of breath.  Diuresing well  REVIEW OF SYSTEMS:  Review of Systems  Constitutional:  Negative for diaphoresis, fever, malaise/fatigue and weight loss.  HENT:  Negative for ear discharge, ear pain, hearing loss, nosebleeds, sore throat and tinnitus.   Eyes:  Negative for blurred vision and pain.  Respiratory:  Positive for shortness of breath. Negative for cough, hemoptysis and wheezing.   Cardiovascular:  Negative for chest pain, palpitations, orthopnea and leg swelling.  Gastrointestinal:  Negative for abdominal pain, blood in stool, constipation, diarrhea, heartburn, nausea and vomiting.  Genitourinary:  Negative for dysuria, frequency and urgency.  Musculoskeletal:  Negative for back pain and myalgias.  Skin:  Negative for itching and rash.  Neurological:  Negative for dizziness, tingling, tremors, focal weakness, seizures, weakness and headaches.  Psychiatric/Behavioral:  Negative for depression. The patient is not nervous/anxious.   DRUG ALLERGIES:  No Known Allergies VITALS:  Blood pressure 111/85, pulse 95, temperature 98.7 F (37.1 C), temperature source Oral, resp. rate 12, height 4\' 10"  (1.473 m), weight 56.1 kg, SpO2 99 %. PHYSICAL EXAMINATION:  Physical Exam 80 year old frail looking female in no acute distress Eyes pupil equal round reactive to light and accommodation.  No scleral icterus Lungs decreased breath sounds at the bases.  No wheezing rales rhonchi crepitation Cardiovascular 2/6 systolic ejection murmur.  Normal S1-S2 Abdomen soft, benign Extremities no pedal edema cyanosis or clubbing Neuro alert and oriented, nonfocal Skin no rash or lesion LABORATORY PANEL:  Female CBC Recent Labs   Lab 05/28/21 0456  WBC 11.4*  HGB 12.1  HCT 33.7*  PLT 229    ------------------------------------------------------------------------------------------------------------------ Chemistries  Recent Labs  Lab 05/25/21 0506 05/26/21 0448 05/28/21 0456  NA 134*   < > 133*  K 3.9   < > 3.8  CL 96*   < > 95*  CO2 30   < > 28  GLUCOSE 160*   < > 189*  BUN 56*   < > 87*  CREATININE 2.13*   < > 2.61*  CALCIUM 8.9   < > 8.6*  MG 2.1  --   --    < > = values in this interval not displayed.    MEDICATIONS:  Scheduled Meds:  atorvastatin  10 mg Oral QPM   citalopram  40 mg Oral Daily   donepezil  5 mg Oral Daily   ferrous sulfate  325 mg Oral Q breakfast   furosemide  40 mg Oral Daily   insulin aspart  0-5 Units Subcutaneous QHS   insulin aspart  0-9 Units Subcutaneous TID WC   linaclotide  145 mcg Oral QAC breakfast   metoprolol tartrate  75 mg Oral BID   pantoprazole  40 mg Oral BID   polyethylene glycol  17 g Oral Daily   rOPINIRole  0.5 mg Oral QPM   cyanocobalamin  1,000 mcg Oral Daily   Warfarin - Pharmacist Dosing Inpatient   Does not apply q1600   Continuous Infusions: RADIOLOGY:  No results found. ASSESSMENT AND PLAN:  80 y.o. female with medical history significant of HTN, HLD, DM, pulmonary fibrosis, dCHF, s/p of right nephrectomy, CKD stage IV, paroxysmal atrial  fibrillation on Coumadin, anemia of chronic kidney disease, colitis, GERD, depression, dementia, legally blindness, admitted for worsening shortness breath and weight gain  Principal Problem:   Acute on chronic diastolic congestive heart failure (HCC) Active Problems:   GERD (gastroesophageal reflux disease)   Atrial fibrillation (HCC)   CKD stage 4 due to type 2 diabetes mellitus (HCC)   Essential hypertension   Anemia associated with stage 4 chronic renal failure (HCC)   Type II diabetes mellitus with renal manifestations (HCC)   HLD (hyperlipidemia)   Depression   Legally blind   Abdominal  pain  Acute on chronic diastolic congestive heart failure ([place]): 2D echo on 12/22/2020 showed EF of 60 to 65%.  Patient does not have leg edema, but has elevated BNP 1334 on admission, interstitial pulmonary edema on chest x-ray,No oxygen desaturation on room air at rest or on ambulation.   -Continue Lasix 40 mg once daily  Net IO Since Admission: -6,105 mL [05/28/21 1134]  -2d echo shows normal LV systolic function --strict I/O's -Low salt diet -Fluid restriction   Abdominal pain: Patient reported epigastric abdominal pain on admission-now resolved -Normal lipase -CT scan of abdomen/pelvis without contrast within normal limits   GERD (gastroesophageal reflux disease) -Continue Protonix   Paroxysmal atrial fibrillation ([place]):  -Continue Coumadin.  Therapeutic INR -Rate well controlled with metoprolol 75 mg p.o. twice daily   CKD stage 4 due to type 2 diabetes mellitus ([place]): -IV Lasix held this morning due to increase in creatinine but nephrology has restarted Lasix 40 mg p.o. daily for now Creatinine 2.97-> 2.61   Essential hypertension:  Blood pressure controlled with Lopressor 75 mg p.o. twice daily   Anemia associated with stage 4 chronic renal failure ([place]):  -Continue iron supplement   Diet controlled Type II diabetes mellitus with renal manifestations [facility]): Recent A1c 6.6,  Sliding scale insulin for now   HLD (hyperlipidemia) -Lipitor   Depression: -Celexa   Legally blind -Fall precaution   Moderate protein calorie malnutrition Body mass index is 25.83 kg/m. She has muscle wasting  Net IO Since Admission: -6,105 mL [05/28/21 1134]      LOS: 4 days   Consultants: Cardiology/[facility]   Antibiotics: None  Status is: Inpatient  Remains inpatient appropriate because:Hemodynamically unstable  Dispo: The patient is from: Home              Anticipated d/c is to: Home with home health              Patient currently is not medically stable to d/c.  Needs a  better fluid equilibrium and kidney function before discharge   Difficult to place patient No   DVT prophylaxis:          Therapeutic anticoagulation with warfarin    Family Communication: Updated patient's daughter over phone on 8/3   All the records are reviewed and case discussed with Nursing and TOC team. Management plans discussed with the patient, nursing and they are in agreement.  CODE STATUS: Partial Code Level of care: Progressive Cardiac  TOTAL TIME TAKING CARE OF THIS PATIENT: 35 minutes.   More than 50% of the time was spent in counseling/coordination of care: YES  POSSIBLE D/C IN 1-2 DAYS, DEPENDING ON CLINICAL CONDITION.   Max Sane M.D on 05/28/2021 at 11:34 AM  [facility]   CC: Primary care physician; Lavera Guise, MD  Note: This dictation was prepared with Dragon dictation along with smaller phrase technology. Any transcriptional errors that result from  this process are unintentional.

## 2021-05-28 NOTE — Plan of Care (Signed)
  Problem: Education: Goal: Knowledge of General Education information will improve Description: Including pain rating scale, medication(s)/side effects and non-pharmacologic comfort measures 05/28/2021 1138 by Cristela Blue, RN Outcome: Progressing 05/28/2021 1138 by Cristela Blue, RN Outcome: Progressing   Problem: Health Behavior/Discharge Planning: Goal: Ability to manage health-related needs will improve 05/28/2021 1138 by Cristela Blue, RN Outcome: Progressing 05/28/2021 1138 by Cristela Blue, RN Outcome: Progressing   Problem: Clinical Measurements: Goal: Ability to maintain clinical measurements within normal limits will improve 05/28/2021 1138 by Cristela Blue, RN Outcome: Progressing 05/28/2021 1138 by Cristela Blue, RN Outcome: Progressing Goal: Will remain free from infection 05/28/2021 1138 by Cristela Blue, RN Outcome: Progressing 05/28/2021 1138 by Cristela Blue, RN Outcome: Progressing Goal: Diagnostic test results will improve 05/28/2021 1138 by Cristela Blue, RN Outcome: Progressing 05/28/2021 1138 by Cristela Blue, RN Outcome: Progressing Goal: Respiratory complications will improve 05/28/2021 1138 by Cristela Blue, RN Outcome: Progressing 05/28/2021 1138 by Cristela Blue, RN Outcome: Progressing Goal: Cardiovascular complication will be avoided 05/28/2021 1138 by Cristela Blue, RN Outcome: Progressing 05/28/2021 1138 by Cristela Blue, RN Outcome: Progressing   Problem: Activity: Goal: Risk for activity intolerance will decrease 05/28/2021 1138 by Cristela Blue, RN Outcome: Progressing 05/28/2021 1138 by Cristela Blue, RN Outcome: Progressing   Problem: Nutrition: Goal: Adequate nutrition will be maintained 05/28/2021 1138 by Cristela Blue, RN Outcome: Progressing 05/28/2021 1138 by Cristela Blue, RN Outcome: Progressing   Problem: Coping: Goal: Level of anxiety will decrease 05/28/2021 1138 by Cristela Blue, RN Outcome: Progressing 05/28/2021 1138 by  Cristela Blue, RN Outcome: Progressing   Problem: Elimination: Goal: Will not experience complications related to bowel motility 05/28/2021 1138 by Cristela Blue, RN Outcome: Progressing 05/28/2021 1138 by Cristela Blue, RN Outcome: Progressing Goal: Will not experience complications related to urinary retention 05/28/2021 1138 by Cristela Blue, RN Outcome: Progressing 05/28/2021 1138 by Cristela Blue, RN Outcome: Progressing   Problem: Pain Managment: Goal: General experience of comfort will improve 05/28/2021 1138 by Cristela Blue, RN Outcome: Progressing 05/28/2021 1138 by Cristela Blue, RN Outcome: Progressing   Problem: Safety: Goal: Ability to remain free from injury will improve 05/28/2021 1138 by Cristela Blue, RN Outcome: Progressing 05/28/2021 1138 by Cristela Blue, RN Outcome: Progressing   Problem: Skin Integrity: Goal: Risk for impaired skin integrity will decrease 05/28/2021 1138 by Cristela Blue, RN Outcome: Progressing 05/28/2021 1138 by Cristela Blue, RN Outcome: Progressing

## 2021-05-29 ENCOUNTER — Encounter: Payer: Self-pay | Admitting: Internal Medicine

## 2021-05-29 LAB — BASIC METABOLIC PANEL
Anion gap: 8 (ref 5–15)
BUN: 106 mg/dL — ABNORMAL HIGH (ref 8–23)
CO2: 28 mmol/L (ref 22–32)
Calcium: 8.5 mg/dL — ABNORMAL LOW (ref 8.9–10.3)
Chloride: 98 mmol/L (ref 98–111)
Creatinine, Ser: 2.89 mg/dL — ABNORMAL HIGH (ref 0.44–1.00)
GFR, Estimated: 16 mL/min — ABNORMAL LOW (ref >60)
Glucose, Bld: 166 mg/dL — ABNORMAL HIGH (ref 70–99)
Glucose: 250
Potassium: 4 mmol/L (ref 3.5–5.1)
Sodium: 134 mmol/L — ABNORMAL LOW (ref 135–145)

## 2021-05-29 LAB — GLUCOSE, CAPILLARY
Glucose-Capillary: 156 mg/dL — ABNORMAL HIGH (ref 70–99)
Glucose-Capillary: 250 mg/dL — ABNORMAL HIGH (ref 70–99)
Glucose-Capillary: 119 mg/dL — ABNORMAL HIGH (ref 70–99)
Glucose-Capillary: 228 mg/dL — ABNORMAL HIGH (ref 70–99)

## 2021-05-29 LAB — PROTIME-INR
INR: 2.5 — ABNORMAL HIGH (ref 0.8–1.2)
Prothrombin Time: 26.7 seconds — ABNORMAL HIGH (ref 11.4–15.2)

## 2021-05-29 LAB — CBC
HCT: 30.9 % — ABNORMAL LOW (ref 36.0–46.0)
Hemoglobin: 10.6 g/dL — ABNORMAL LOW (ref 12.0–15.0)
MCH: 31.3 pg (ref 26.0–34.0)
MCHC: 34.3 g/dL (ref 30.0–36.0)
MCV: 91.2 fL (ref 80.0–100.0)
Platelets: 224 10*3/uL (ref 150–400)
RBC: 3.39 MIL/uL — ABNORMAL LOW (ref 3.87–5.11)
RDW: 14.3 % (ref 11.5–15.5)
WBC: 10.3 10*3/uL (ref 4.0–10.5)
nRBC: 0 % (ref 0.0–0.2)

## 2021-05-29 MED ORDER — WARFARIN SODIUM 2 MG PO TABS
2.0000 mg | ORAL_TABLET | ORAL | Status: DC
  Administered 2021-05-29: 2 mg via ORAL
  Filled 2021-05-29: qty 1

## 2021-05-29 MED ORDER — WARFARIN SODIUM 4 MG PO TABS
4.0000 mg | ORAL_TABLET | ORAL | Status: DC
  Filled 2021-05-29: qty 1

## 2021-05-29 NOTE — TOC Initial Note (Signed)
Transition of Care [facility]) - Initial/Assessment Note    Patient Details  Name: [name] MRN: [id number] Date of Birth: [birth year]  Transition of Care [facility]) CM/SW Contact:    Alberteen Sam, LCSW Phone Number: 05/29/2021, 2:07 PM  Clinical Narrative:                  CSW met with patient at bedside to discuss PT recommendation of home health services. Patient is in agreement to receive [facility] PT and RN at time of discharge. Reports she lives at home with her daughter and has a walker and a cane. Confirms her PCP is Dr. Humphrey Rolls and that she has no problems getting her medications or getting to a from appointments.   CSW to send out [facility] referrals pending acceptance at this time.   Expected Discharge Plan: [place] Barriers to Discharge: Continued Medical Work up   Patient Goals and CMS Choice Patient states their goals for this hospitalization and ongoing recovery are:: to go home CMS Medicare.gov Compare Post Acute Care list provided to:: Patient Choice offered to / list presented to : Patient  Expected Discharge Plan and Services Expected Discharge Plan: [place] Choice: [place] arrangements for the past 2 months: Single Family Home                           HH Arranged: PT, RN          Prior Living Arrangements/Services Living arrangements for the past 2 months: Single Family Home Lives with:: Relatives Patient language and need for interpreter reviewed:: Yes Do you feel safe going back to the place where you live?: Yes      Need for Family Participation in Patient Care: Yes (Comment) Care giver support system in place?: Yes (comment) Current home services: DME Criminal Activity/Legal Involvement Pertinent to Current Situation/Hospitalization: No - Comment as needed  Activities of Daily Living Home Assistive Devices/Equipment: Cane (specify quad or straight) ADL Screening (condition at time of  admission) Patient's cognitive ability adequate to safely complete daily activities?: Yes Is the patient deaf or have difficulty hearing?: No Does the patient have difficulty seeing, even when wearing glasses/contacts?: Yes Does the patient have difficulty concentrating, remembering, or making decisions?: No Patient able to express need for assistance with ADLs?: Yes Does the patient have difficulty dressing or bathing?: No Independently performs ADLs?: Yes (appropriate for developmental age) Does the patient have difficulty walking or climbing stairs?: Yes Weakness of Legs: Left Weakness of Arms/Hands: None  Permission Sought/Granted Permission sought to share information with : Customer service manager, Family Supports Permission granted to share information with : Yes, Verbal Permission Granted     Permission granted to share info w AGENCY: HH, DME if needed  Permission granted to share info w Relationship: daughter     Emotional Assessment Appearance:: Appears stated age Attitude/Demeanor/Rapport: Gracious Affect (typically observed): Calm Orientation: : Oriented to Self, Oriented to Place, Oriented to  Time, Oriented to Situation Alcohol / Substance Use: Not Applicable Psych Involvement: No (comment)  Admission diagnosis:  Acute on chronic diastolic congestive heart failure (HCC) [I50.33] Acute on chronic congestive heart failure, unspecified heart failure type [facility]) [I50.9] Patient Active Problem List   Diagnosis Date Noted   Acute on chronic diastolic congestive heart failure ([place]) 05/24/2021   Type II diabetes mellitus with renal manifestations ([place]) 05/24/2021  HLD (hyperlipidemia) 05/24/2021   Chest pain 05/24/2021   Depression 05/24/2021   Legally blind 05/24/2021   Abdominal pain 05/24/2021   Chronic respiratory failure with hypoxia ([place]) 03/16/2021   SOB (shortness of breath) 03/15/2021   Benign hypertensive kidney disease with chronic kidney disease  03/14/2021   Chronic kidney disease, stage IV (severe) ([place] ) 03/14/2021   Acute renal failure superimposed on stage 4 chronic kidney disease ([place]) 02/08/2021   Anemia associated with stage 4 chronic renal failure ([place]) 02/08/2021   Anemia due to stage 5 chronic kidney disease ([place]) 02/07/2021   Acute CHF (congestive heart failure) ([place]) 02/06/2021   Acute on chronic diastolic CHF (congestive heart failure) ([place]) 02/06/2021   Hyperkalemia 02/06/2021   Weakness 12/24/2020   Hypertensive urgency 12/23/2020   Nicotine dependence 12/23/2020   History of anemia due to chronic kidney disease 12/23/2020   Pain due to onychomycosis of toenails of both feet 02/12/2020   Uncontrolled type 2 diabetes mellitus with hyperglycemia ([place]) 05/20/2019   Acute recurrent pansinusitis 05/11/2019   Acute otitis media 05/11/2019   Acute pain of left shoulder 05/11/2019   Long term (current) use of anticoagulants 04/03/2019   Chronic left shoulder pain 03/18/2019   Sepsis ([place]) 01/12/2018   Colitis 01/12/2018   CKD (chronic kidney disease), stage III ([place]) 01/12/2018   Diabetes ([place]) 12/28/2017   AF (paroxysmal atrial fibrillation) ([place]) 12/28/2017   Encounter for general adult medical examination with abnormal findings 12/28/2017   Primary generalized (osteo)arthritis 12/28/2017   Hypertension 12/27/2017   GERD (gastroesophageal reflux disease) 12/27/2017   Hematuria 03/18/2015   Chronic combined systolic and diastolic heart failure ([place]) 03/01/2012   Obesity 03/01/2012   CKD stage 4 due to type 2 diabetes mellitus ([place]) 03/01/2012   Other benign neoplasm of connective and other soft tissue of lower limb, including hip 03/15/2011   Neoplasm of connective tissue 02/01/2011   Pain in joint, pelvic region and thigh 02/01/2011   Type 2 diabetes mellitus with stage 5 chronic kidney disease ([place]) 10/03/2006   Encounter for current long-term use of anticoagulants 02/07/2006   Essential hypertension 09/24/2004    Atrial fibrillation ([place]) 06/07/2004   PCP:  Lavera Guise, MD Pharmacy:   [facility]) - [place], [place] [address] Phone: [phone] Fax: [phone]  [facility] [address] ([address]), [place]) [place] [address] Phone: [phone] Fax: [phone]     Social Determinants of Health (SDOH) Interventions    Readmission Risk Interventions Readmission Risk Prevention Plan 05/27/2021  Transportation Screening Complete  Medication Review ([place]) Complete  PCP or Specialist appointment within 3-5 days of discharge Complete  HRI or Home Care Consult Complete  SW Recovery Care/Counseling Consult Complete  Palliative Care Screening Complete  Skilled Nursing Facility Complete  Some recent data might be hidden

## 2021-05-29 NOTE — Progress Notes (Signed)
[facility]  ROUNDING NOTE   Subjective:   [name]  is a 80 y.o. female with past medical history of anemia, Atrial Fib, CHD, CHF diabetes, and hypertension. Patient reports having trouble breathing and having excess fluid. Patient will be admitted for Acute on chronic diastolic congestive heart failure (HCC) [I50.33] Acute on chronic congestive heart failure, unspecified heart failure type [facility]) [I50.9]  Patient is known to our practice and currently sees Dr Juleen China.   Patient sitting at the side of bed, completing breakfast Alert and oriented States she feels well Denies shortness of breath    Objective:  Vital signs in last 24 hours:  Temp:  [97.5 F (36.4 C)-98.1 F (36.7 C)] 97.6 F (36.4 C) (08/07 1116) Pulse Rate:  [83-100] 83 (08/07 1116) Resp:  [15-19] 15 (08/07 1116) BP: (107-119)/(49-85) 110/60 (08/07 1116) SpO2:  [97 %-100 %] 100 % (08/07 1116) Weight:  [55.2 kg] 55.2 kg (08/07 0218)  Weight change: -0.862 kg Filed Weights   05/27/21 0338 05/28/21 0500 05/29/21 0218  Weight: 50 kg 56.1 kg 55.2 kg    Intake/Output: I/O last 3 completed shifts: In: 600 [P.O.:600] Out: 950 [Urine:950]   Intake/Output this shift:  Total I/O In: -  Out: 200 [Urine:200]  Physical Exam: General: NAD, sitting bedside  Head: Normocephalic, atraumatic. Moist oral mucosal membranes  Eyes: Anicteric  Lungs:  Clear to auscultation, normal effort  Heart: Regular rate and rhythm  Abdomen:  Soft, nontender  Extremities:  no peripheral edema.  Neurologic: Nonfocal, moving all four extremities  Skin: No lesions       Basic Metabolic Panel: Recent Labs  Lab 05/25/21 0506 05/26/21 0448 05/27/21 0507 05/28/21 0456 05/29/21 0435  NA 134* 135 135 133* 134*  K 3.9 3.9 3.9 3.8 4.0  CL 96* 93* 95* 95* 98  CO2 30 32 29 28 28   GLUCOSE 160* 185* 203* 189* 166*  BUN 56* 64* 92* 87* 106*  CREATININE 2.13* 2.44* 2.97* 2.61* 2.89*  CALCIUM 8.9 8.7* 8.6* 8.6*  8.5*  MG 2.1  --   --   --   --      Liver Function Tests: No results for input(s): AST, ALT, ALKPHOS, BILITOT, PROT, ALBUMIN in the last 168 hours. Recent Labs  Lab 05/24/21 0742  LIPASE 32    No results for input(s): AMMONIA in the last 168 hours.  CBC: Recent Labs  Lab 05/24/21 0455 05/26/21 0448 05/27/21 0507 05/28/21 0456 05/29/21 0435  WBC 8.2 13.8* 13.9* 11.4* 10.3  HGB 9.7* 12.2 12.0 12.1 10.6*  HCT 29.3* 35.6* 34.8* 33.7* 30.9*  MCV 93.0 92.0 92.6 92.6 91.2  PLT 245 259 236 229 224     Cardiac Enzymes: No results for input(s): CKTOTAL, CKMB, CKMBINDEX, TROPONINI in the last 168 hours.  BNP: Invalid input(s): POCBNP  CBG: Recent Labs  Lab 05/28/21 1145 05/28/21 1632 05/28/21 2051 05/29/21 0721 05/29/21 1117  GLUCAP 200* 136* 140* 156* 250*     Microbiology: Results for orders placed or performed during the hospital encounter of 05/24/21  Resp Panel by RT-PCR (Flu A&B, Covid) Nasopharyngeal Swab     Status: None   Collection Time: 05/24/21  5:44 AM   Specimen: Nasopharyngeal Swab; Nasopharyngeal(NP) swabs in vial transport medium  Result Value Ref Range Status   SARS Coronavirus 2 by RT PCR NEGATIVE NEGATIVE Final    Comment: (NOTE) SARS-CoV-2 target nucleic acids are NOT DETECTED.  The SARS-CoV-2 RNA is generally detectable in upper respiratory specimens during  the acute phase of infection. The lowest concentration of SARS-CoV-2 viral copies this assay can detect is 138 copies/mL. A negative result does not preclude SARS-Cov-2 infection and should not be used as the sole basis for treatment or other patient management decisions. A negative result may occur with  improper specimen collection/handling, submission of specimen other than nasopharyngeal swab, presence of viral mutation(s) within the areas targeted by this assay, and inadequate number of viral copies(<138 copies/mL). A negative result must be combined with clinical observations,  patient history, and epidemiological information. The expected result is Negative.  Fact Sheet for Patients:  EntrepreneurPulse.com.au  Fact Sheet for Healthcare Providers:  IncredibleEmployment.be  This test is no t yet approved or cleared by the [place] FDA and  has been authorized for detection and/or diagnosis of SARS-CoV-2 by FDA under an Emergency Use Authorization (EUA). This EUA will remain  in effect (meaning this test can be used) for the duration of the COVID-19 declaration under Section 564(b)(1) of the Act, 21 U.S.C.section 360bbb-3(b)(1), unless the authorization is terminated  or revoked sooner.       Influenza A by PCR NEGATIVE NEGATIVE Final   Influenza B by PCR NEGATIVE NEGATIVE Final    Comment: (NOTE) The Xpert Xpress SARS-CoV-2/FLU/RSV plus assay is intended as an aid in the diagnosis of influenza from Nasopharyngeal swab specimens and should not be used as a sole basis for treatment. Nasal washings and aspirates are unacceptable for Xpert Xpress SARS-CoV-2/FLU/RSV testing.  Fact Sheet for Patients: EntrepreneurPulse.com.au  Fact Sheet for Healthcare Providers: IncredibleEmployment.be  This test is not yet approved or cleared by the [place] FDA and has been authorized for detection and/or diagnosis of SARS-CoV-2 by FDA under an Emergency Use Authorization (EUA). This EUA will remain in effect (meaning this test can be used) for the duration of the COVID-19 declaration under Section 564(b)(1) of the Act, 21 U.S.C. section 360bbb-3(b)(1), unless the authorization is terminated or revoked.  Performed at [facility], [address] [place], [place] [address]     Coagulation Studies: Recent Labs    05/27/21 0507 05/28/21 0456 05/29/21 0435  LABPROT 23.9* 27.2* 26.7*  INR 2.1* 2.5* 2.5*     Urinalysis: No results for input(s): COLORURINE, LABSPEC,  PHURINE, GLUCOSEU, HGBUR, BILIRUBINUR, KETONESUR, PROTEINUR, UROBILINOGEN, NITRITE, LEUKOCYTESUR in the last 72 hours.  Invalid input(s): APPERANCEUR    Imaging: No results found.   Medications:     atorvastatin  10 mg Oral QPM   citalopram  40 mg Oral Daily   donepezil  5 mg Oral Daily   ferrous sulfate  325 mg Oral Q breakfast   insulin aspart  0-5 Units Subcutaneous QHS   insulin aspart  0-9 Units Subcutaneous TID WC   linaclotide  145 mcg Oral QAC breakfast   metoprolol tartrate  75 mg Oral BID   pantoprazole  40 mg Oral BID   polyethylene glycol  17 g Oral Daily   rOPINIRole  0.5 mg Oral QPM   cyanocobalamin  1,000 mcg Oral Daily   warfarin  2 mg Oral Once per day on Sun Tue Thu Sat   And   [START ON 05/30/2021] warfarin  4 mg Oral Once per day on Mon Wed Fri   Warfarin - Pharmacist Dosing Inpatient   Does not apply q1600   acetaminophen (TYLENOL) oral liquid 160 mg/5 mL, albuterol, dextromethorphan-guaiFENesin, melatonin, ondansetron (ZOFRAN) IV  Assessment/ Plan:  Ms. STACYANN MCCONAUGHY is a 80 y.o.  female with past  medical history of anemia, Atrial Fib, CHD, CHF diabetes, and hypertension. Patient reports having trouble breathing and having excess fluid. Patient will be admitted for Acute on chronic diastolic congestive heart failure (HCC) [I50.33] Acute on chronic congestive heart failure, unspecified heart failure type ([place]) [I50.9]   Chronic kidney disease stage 4 with creatinine better than baseline, IV Lasix held this am due to increased creatinine on 05/28/21 po Lasix 40 mg daily held today for increased creatinine  Due to fluctuating renal function, diuretic management will be challenging. Would consider patient for dialysis, but not at this time. Will attempt to manage on diuretic therapy and monitor closely. May consider every other day diuretic therapy and evaluate effectiveness.     Lab Results  Component Value Date   CREATININE 2.89 (H) 05/29/2021    CREATININE 2.61 (H) 05/28/2021   CREATININE 2.97 (H) 05/27/2021    Intake/Output Summary (Last 24 hours) at 05/29/2021 1317 Last data filed at 05/29/2021 1120 Gross per 24 hour  Intake 240 ml  Output 1150 ml  Net -910 ml    2. Acute respiratory distress likely secondary to acute exacerbation of heart failure. Room air,  Furosemide held today   3. Hypertension:110/60 Home medications include Hydralazine, held Currently on Metoprolol    LOS: 5   8/7/20221:17 PM

## 2021-05-29 NOTE — Plan of Care (Signed)
  Problem: Education: Goal: Knowledge of General Education information will improve Description: Including pain rating scale, medication(s)/side effects and non-pharmacologic comfort measures 05/29/2021 1230 by Cristela Blue, RN Outcome: Progressing 05/29/2021 1230 by Cristela Blue, RN Outcome: Progressing   Problem: Health Behavior/Discharge Planning: Goal: Ability to manage health-related needs will improve 05/29/2021 1230 by Cristela Blue, RN Outcome: Progressing 05/29/2021 1230 by Cristela Blue, RN Outcome: Progressing   Problem: Clinical Measurements: Goal: Ability to maintain clinical measurements within normal limits will improve 05/29/2021 1230 by Cristela Blue, RN Outcome: Progressing 05/29/2021 1230 by Cristela Blue, RN Outcome: Progressing Goal: Will remain free from infection 05/29/2021 1230 by Cristela Blue, RN Outcome: Progressing 05/29/2021 1230 by Cristela Blue, RN Outcome: Progressing Goal: Diagnostic test results will improve 05/29/2021 1230 by Cristela Blue, RN Outcome: Progressing 05/29/2021 1230 by Cristela Blue, RN Outcome: Progressing Goal: Respiratory complications will improve 05/29/2021 1230 by Cristela Blue, RN Outcome: Progressing 05/29/2021 1230 by Cristela Blue, RN Outcome: Progressing Goal: Cardiovascular complication will be avoided 05/29/2021 1230 by Cristela Blue, RN Outcome: Progressing 05/29/2021 1230 by Cristela Blue, RN Outcome: Progressing   Problem: Activity: Goal: Risk for activity intolerance will decrease 05/29/2021 1230 by Cristela Blue, RN Outcome: Progressing 05/29/2021 1230 by Cristela Blue, RN Outcome: Progressing   Problem: Nutrition: Goal: Adequate nutrition will be maintained 05/29/2021 1230 by Cristela Blue, RN Outcome: Progressing 05/29/2021 1230 by Cristela Blue, RN Outcome: Progressing   Problem: Coping: Goal: Level of anxiety will decrease 05/29/2021 1230 by Cristela Blue, RN Outcome: Progressing 05/29/2021 1230 by  Cristela Blue, RN Outcome: Progressing   Problem: Elimination: Goal: Will not experience complications related to bowel motility 05/29/2021 1230 by Cristela Blue, RN Outcome: Progressing 05/29/2021 1230 by Cristela Blue, RN Outcome: Progressing Goal: Will not experience complications related to urinary retention 05/29/2021 1230 by Cristela Blue, RN Outcome: Progressing 05/29/2021 1230 by Cristela Blue, RN Outcome: Progressing   Problem: Pain Managment: Goal: General experience of comfort will improve 05/29/2021 1230 by Cristela Blue, RN Outcome: Progressing 05/29/2021 1230 by Cristela Blue, RN Outcome: Progressing   Problem: Safety: Goal: Ability to remain free from injury will improve 05/29/2021 1230 by Cristela Blue, RN Outcome: Progressing 05/29/2021 1230 by Cristela Blue, RN Outcome: Progressing   Problem: Skin Integrity: Goal: Risk for impaired skin integrity will decrease 05/29/2021 1230 by Cristela Blue, RN Outcome: Progressing 05/29/2021 1230 by Cristela Blue, RN Outcome: Progressing

## 2021-05-29 NOTE — Progress Notes (Addendum)
[place] for Warfarin management Indication: atrial fibrillation  No Known Allergies  Vital Signs: Temp: 97.7 F (36.5 C) (08/07 0721) Temp Source: Axillary (08/07 0721) BP: 119/49 (08/07 0721) Pulse Rate: 100 (08/07 0400)  Labs: Recent Labs    05/27/21 0507 05/28/21 0456 05/29/21 0435  HGB 12.0 12.1 10.6*  HCT 34.8* 33.7* 30.9*  PLT 236 229 224  LABPROT 23.9* 27.2* 26.7*  INR 2.1* 2.5* 2.5*  CREATININE 2.97* 2.61* 2.89*     Medical History: Past Medical History:  Diagnosis Date   Anemia    Atrial fibrillation (HCC)    Chronic kidney disease    Congestive heart failure (CHF) (HCC)    Diabetes mellitus without complication (HCC)    GERD (gastroesophageal reflux disease)    Hyperlipidemia    Hypertension    Pneumonia    Pulmonary fibrosis (HCC)       Assessment: 80YOF on anticoagulation for atrial fibrillation. Admitted on 8/2 with chest pain and shortness of breath. PMH includes HFpEF, CKD, HTN, HLD, pulmonary fibrosis, and atrial fibrillation on warfarin at home, taking warfarin 2 mg every day, except 4 mg on Monday Wednesday and Friday. Pharmacy has been consulted for warfarin management.   Patient home regimen:  4 mg on Mon/Wed/Sat; 2 mg on all other days  Pt last dose was 4 mg on 05/23/21  08/02 INR 2.3  therapeutic > 2mg  08/03 INR 2.1  therapeutic > 4 mg 08/04 INR 1.9  subtherapeutic > 4 mg  08/05 INR 2.1 therapeutic > 2 mg 08/06 INR 2.5 therapeutic > -- 08/07 INR 2.5 therapeutic > 2 mg  Goal of Therapy:  INR 2-3    Plan:  INR 2.5 therapeutic. Will continue with home dose of warfarin. Check INR with morning labs. If relatively stable, will decrease frequency of monitoring to ~ 72 hours. Monitor CBC per protocol   Tawnya Crook, PharmD, BCPS Clinical Pharmacist 05/29/2021 9:45 AM

## 2021-05-29 NOTE — Progress Notes (Signed)
[facility] NAME: [name]    MR#:  [id number]  PCP: Lavera Guise, MD  DATE OF BIRTH:  [birth year]  SUBJECTIVE:  CHIEF COMPLAINT:   Chief Complaint  Patient presents with   Chest Pain   Shortness of Breath  Improving shortness of breath.  Creatinine increased today.  Patient denies any other symptoms REVIEW OF SYSTEMS:  Review of Systems  Constitutional:  Negative for diaphoresis, fever, malaise/fatigue and weight loss.  HENT:  Negative for ear discharge, ear pain, hearing loss, nosebleeds, sore throat and tinnitus.   Eyes:  Negative for blurred vision and pain.  Respiratory:  Positive for shortness of breath. Negative for cough, hemoptysis and wheezing.   Cardiovascular:  Negative for chest pain, palpitations, orthopnea and leg swelling.  Gastrointestinal:  Negative for abdominal pain, blood in stool, constipation, diarrhea, heartburn, nausea and vomiting.  Genitourinary:  Negative for dysuria, frequency and urgency.  Musculoskeletal:  Negative for back pain and myalgias.  Skin:  Negative for itching and rash.  Neurological:  Negative for dizziness, tingling, tremors, focal weakness, seizures, weakness and headaches.  Psychiatric/Behavioral:  Negative for depression. The patient is not nervous/anxious.   DRUG ALLERGIES:  No Known Allergies VITALS:  Blood pressure 110/60, pulse 83, temperature 97.6 F (36.4 C), temperature source Oral, resp. rate 15, height 4\' 10"  (1.473 m), weight 55.2 kg, SpO2 100 %. PHYSICAL EXAMINATION:  Physical Exam 80 year old frail looking female in no acute distress Eyes pupil equal round reactive to light and accommodation.  No scleral icterus Lungs decreased breath sounds at the bases.  No wheezing rales rhonchi crepitation Cardiovascular 2/6 systolic ejection murmur.  Normal S1-S2 Abdomen soft, benign Extremities no pedal edema cyanosis or clubbing Neuro alert and oriented, nonfocal Skin no rash or  lesion LABORATORY PANEL:  Female CBC Recent Labs  Lab 05/29/21 0435  WBC 10.3  HGB 10.6*  HCT 30.9*  PLT 224    ------------------------------------------------------------------------------------------------------------------ Chemistries  Recent Labs  Lab 05/25/21 0506 05/26/21 0448 05/29/21 0435  NA 134*   < > 134*  K 3.9   < > 4.0  CL 96*   < > 98  CO2 30   < > 28  GLUCOSE 160*   < > 166*  BUN 56*   < > 106*  CREATININE 2.13*   < > 2.89*  CALCIUM 8.9   < > 8.5*  MG 2.1  --   --    < > = values in this interval not displayed.    MEDICATIONS:  Scheduled Meds:  atorvastatin  10 mg Oral QPM   citalopram  40 mg Oral Daily   donepezil  5 mg Oral Daily   ferrous sulfate  325 mg Oral Q breakfast   insulin aspart  0-5 Units Subcutaneous QHS   insulin aspart  0-9 Units Subcutaneous TID WC   linaclotide  145 mcg Oral QAC breakfast   metoprolol tartrate  75 mg Oral BID   pantoprazole  40 mg Oral BID   polyethylene glycol  17 g Oral Daily   rOPINIRole  0.5 mg Oral QPM   cyanocobalamin  1,000 mcg Oral Daily   warfarin  2 mg Oral Once per day on Sun Tue Thu Sat   And   [START ON 05/30/2021] warfarin  4 mg Oral Once per day on Mon Wed Fri   Warfarin - Pharmacist Dosing Inpatient   Does not apply q1600   Continuous  Infusions: RADIOLOGY:  No results found. ASSESSMENT AND PLAN:  80 y.o. female with medical history significant of HTN, HLD, DM, pulmonary fibrosis, dCHF, s/p of right nephrectomy, CKD stage IV, paroxysmal atrial fibrillation on Coumadin, anemia of chronic kidney disease, colitis, GERD, depression, dementia, legally blindness, admitted for worsening shortness breath and weight gain  Principal Problem:   Acute on chronic diastolic congestive heart failure (HCC) Active Problems:   GERD (gastroesophageal reflux disease)   Atrial fibrillation ([place])   CKD stage 4 due to type 2 diabetes mellitus ([place])   Essential hypertension   Anemia associated with stage 4 chronic  renal failure (HCC)   Type II diabetes mellitus with renal manifestations ([place])   HLD (hyperlipidemia)   Depression   Legally blind   Abdominal pain  Acute on chronic diastolic congestive heart failure ([place]): 2D echo on 12/22/2020 showed EF of 60 to 65%.  Patient does not have leg edema, but has elevated BNP 1334 on admission, interstitial pulmonary edema on chest x-ray,No oxygen desaturation on room air at rest or on ambulation.   -Hold Lasix today as creatinine going up again Net IO Since Admission: -6,655 mL [05/29/21 1425]  -2d echo shows normal LV systolic function --strict I/O's -Low salt diet -Fluid restriction   Abdominal pain: Patient reported epigastric abdominal pain on admission-now resolved -Normal lipase -CT scan of abdomen/pelvis without contrast within normal limits   GERD (gastroesophageal reflux disease) -Continue Protonix   Paroxysmal atrial fibrillation ([place]):  -Continue Coumadin.  Therapeutic INR -Rate well controlled with metoprolol 75 mg p.o. twice daily   CKD stage 4 due to type 2 diabetes mellitus ([place]): -IV Lasix held  due to increase in creatinine  Creatinine 2.97-> 2.61> 2.89.  Recheck creatinine in the morning   Essential hypertension:  Blood pressure controlled with Lopressor 75 mg p.o. twice daily   Anemia associated with stage 4 chronic renal failure ([place]):  -Continue iron supplement   Diet controlled Type II diabetes mellitus with renal manifestations [facility]): Recent A1c 6.6,  Sliding scale insulin for now   HLD (hyperlipidemia) -Lipitor   Depression: -Celexa   Legally blind -Fall precaution   Moderate protein calorie malnutrition Body mass index is 25.44 kg/m. She has muscle wasting  Net IO Since Admission: -6,655 mL [05/29/21 1425]      LOS: 5 days   Consultants: Cardiology/[facility]   Antibiotics: None  Status is: Inpatient  Remains inpatient appropriate because:Hemodynamically unstable  Dispo: The patient is  from: Home              Anticipated d/c is to: Home with home health possibly tomorrow              Patient currently is not medically stable to d/c.  Would like to see kidney function stabilize before discharge   Difficult to place patient No   DVT prophylaxis:        warfarin (COUMADIN) tablet 2 mg  warfarin (COUMADIN) tablet 4 mg   Therapeutic anticoagulation with warfarin    Family Communication: Left voicemail for patient's daughter over phone on 8/7   All the records are reviewed and case discussed with Nursing and TOC team. Management plans discussed with the patient, nursing and they are in agreement.  CODE STATUS: Partial Code Level of care: Progressive Cardiac  TOTAL TIME TAKING CARE OF THIS PATIENT: 35 minutes.   More than 50% of the time was spent in counseling/coordination of care: YES  POSSIBLE D/C IN 1-2 DAYS, DEPENDING ON  CLINICAL CONDITION.   Max Sane M.D on 05/29/2021 at 2:25 PM  [facility]   CC: Primary care physician; Lavera Guise, MD  Note: This dictation was prepared with Dragon dictation along with smaller phrase technology. Any transcriptional errors that result from this process are unintentional.

## 2021-05-30 LAB — CBC
HCT: 31.8 % — ABNORMAL LOW (ref 36.0–46.0)
Hemoglobin: 10.7 g/dL — ABNORMAL LOW (ref 12.0–15.0)
MCH: 31 pg (ref 26.0–34.0)
MCHC: 33.6 g/dL (ref 30.0–36.0)
MCV: 92.2 fL (ref 80.0–100.0)
Platelets: 214 10*3/uL (ref 150–400)
RBC: 3.45 MIL/uL — ABNORMAL LOW (ref 3.87–5.11)
RDW: 14.3 % (ref 11.5–15.5)
WBC: 9.7 10*3/uL (ref 4.0–10.5)
nRBC: 0 % (ref 0.0–0.2)

## 2021-05-30 LAB — BASIC METABOLIC PANEL
Anion gap: 10 (ref 5–15)
BUN: 109 mg/dL — ABNORMAL HIGH (ref 8–23)
CO2: 28 mmol/L (ref 22–32)
Calcium: 9 mg/dL (ref 8.9–10.3)
Chloride: 99 mmol/L (ref 98–111)
Creatinine, Ser: 2.98 mg/dL — ABNORMAL HIGH (ref 0.44–1.00)
GFR, Estimated: 15 mL/min — ABNORMAL LOW (ref >60)
Glucose, Bld: 148 mg/dL — ABNORMAL HIGH (ref 70–99)
Potassium: 4.2 mmol/L (ref 3.5–5.1)
Sodium: 137 mmol/L (ref 135–145)

## 2021-05-30 LAB — PROTIME-INR
INR: 2 — ABNORMAL HIGH (ref 0.8–1.2)
Prothrombin Time: 22.4 seconds — ABNORMAL HIGH (ref 11.4–15.2)

## 2021-05-30 LAB — GLUCOSE, CAPILLARY: Glucose-Capillary: 177 mg/dL — ABNORMAL HIGH (ref 70–99)

## 2021-05-30 MED ORDER — FUROSEMIDE 20 MG PO TABS: 20.0000 mg | ORAL_TABLET | ORAL | 0 refills | Status: DC

## 2021-05-30 MED ORDER — METOPROLOL TARTRATE 75 MG PO TABS: 75.0000 mg | ORAL_TABLET | Freq: Two times a day (BID) | ORAL | 0 refills | Status: DC

## 2021-05-30 NOTE — Progress Notes (Signed)
Patient to discharge home with daughter today. Daughter at bedside and AVS and discharge plan reviewed with patient and family. Patient ambulated to bathroom with walker and standby assist. VSS, no concerns or complaints at this time.

## 2021-05-30 NOTE — Care Management Important Message (Signed)
Important Message  Patient Details  Name: [name] MRN: [id number] Date of Birth: 10/27/1940   Medicare Important Message Given:  Yes     Juliann Pulse A Dorothee Napierkowski 05/30/2021, 11:43 AM

## 2021-05-30 NOTE — Progress Notes (Signed)
[facility]  ROUNDING NOTE   Subjective:   [name]  is a 80 y.o. female with past medical history of anemia, Atrial Fib, CHD, CHF diabetes, and hypertension. Patient reports having trouble breathing and having excess fluid. Patient will be admitted for Acute on chronic diastolic congestive heart failure (HCC) [I50.33] Acute on chronic congestive heart failure, unspecified heart failure type [facility]) [I50.9]  Patient is known to our practice and currently sees Dr Juleen China.   Seen earlier today.  Laying in the bed.  States her appetite is good.  No leg edema. Patient denies any shortness of breath at present    Objective:  Vital signs in last 24 hours:  Temp:  [97.5 F (36.4 C)-98.4 F (36.9 C)] 97.9 F (36.6 C) (08/08 0727) Pulse Rate:  [90-101] 90 (08/08 0727) Resp:  [16-20] 20 (08/08 0727) BP: (101-121)/(60-79) 121/60 (08/08 0800) SpO2:  [98 %-99 %] 98 % (08/08 0727) Weight:  [55.8 kg] 55.8 kg (08/08 0506)  Weight change: 0.59 kg Filed Weights   05/28/21 0500 05/29/21 0218 05/30/21 0506  Weight: 56.1 kg 55.2 kg 55.8 kg    Intake/Output: I/O last 3 completed shifts: In: 240 [P.O.:240] Out: 1800 [Urine:1800]   Intake/Output this shift:  No intake/output data recorded.  Physical Exam: General: NAD, laying in the bed  Head: Normocephalic, atraumatic. Moist oral mucosal membranes  Eyes: Anicteric  Lungs:  Clear to auscultation, normal effort  Heart: Regular rate and rhythm  Abdomen:  Soft, nontender  Extremities:  no peripheral edema.  Neurologic: Nonfocal, moving all four extremities  Skin: No lesions       Basic Metabolic Panel: Recent Labs  Lab 05/25/21 0506 05/26/21 0448 05/27/21 0507 05/28/21 0456 05/29/21 0435 05/30/21 0431  NA 134* 135 135 133* 134* 137  K 3.9 3.9 3.9 3.8 4.0 4.2  CL 96* 93* 95* 95* 98 99  CO2 30 32 29 28 28 28   GLUCOSE 160* 185* 203* 189* 166* 148*  BUN 56* 64* 92* 87* 106* 109*  CREATININE 2.13* 2.44* 2.97*  2.61* 2.89* 2.98*  CALCIUM 8.9 8.7* 8.6* 8.6* 8.5* 9.0  MG 2.1  --   --   --   --   --      Liver Function Tests: No results for input(s): AST, ALT, ALKPHOS, BILITOT, PROT, ALBUMIN in the last 168 hours. Recent Labs  Lab 05/24/21 0742  LIPASE 32    No results for input(s): AMMONIA in the last 168 hours.  CBC: Recent Labs  Lab 05/26/21 0448 05/27/21 0507 05/28/21 0456 05/29/21 0435 05/30/21 0431  WBC 13.8* 13.9* 11.4* 10.3 9.7  HGB 12.2 12.0 12.1 10.6* 10.7*  HCT 35.6* 34.8* 33.7* 30.9* 31.8*  MCV 92.0 92.6 92.6 91.2 92.2  PLT 259 236 229 224 214     Cardiac Enzymes: No results for input(s): CKTOTAL, CKMB, CKMBINDEX, TROPONINI in the last 168 hours.  BNP: Invalid input(s): POCBNP  CBG: Recent Labs  Lab 05/29/21 0721 05/29/21 1117 05/29/21 1647 05/29/21 2036 05/30/21 0729  GLUCAP 156* 250* 119* 228* 177*     Microbiology: Results for orders placed or performed during the hospital encounter of 05/24/21  Resp Panel by RT-PCR (Flu A&B, Covid) Nasopharyngeal Swab     Status: None   Collection Time: 05/24/21  5:44 AM   Specimen: Nasopharyngeal Swab; Nasopharyngeal(NP) swabs in vial transport medium  Result Value Ref Range Status   SARS Coronavirus 2 by RT PCR NEGATIVE NEGATIVE Final    Comment: (NOTE) SARS-CoV-2 target  nucleic acids are NOT DETECTED.  The SARS-CoV-2 RNA is generally detectable in upper respiratory specimens during the acute phase of infection. The lowest concentration of SARS-CoV-2 viral copies this assay can detect is 138 copies/mL. A negative result does not preclude SARS-Cov-2 infection and should not be used as the sole basis for treatment or other patient management decisions. A negative result may occur with  improper specimen collection/handling, submission of specimen other than nasopharyngeal swab, presence of viral mutation(s) within the areas targeted by this assay, and inadequate number of viral copies(<138 copies/mL). A  negative result must be combined with clinical observations, patient history, and epidemiological information. The expected result is Negative.  Fact Sheet for Patients:  EntrepreneurPulse.com.au  Fact Sheet for Healthcare Providers:  IncredibleEmployment.be  This test is no t yet approved or cleared by the [place] FDA and  has been authorized for detection and/or diagnosis of SARS-CoV-2 by FDA under an Emergency Use Authorization (EUA). This EUA will remain  in effect (meaning this test can be used) for the duration of the COVID-19 declaration under Section 564(b)(1) of the Act, 21 U.S.C.section 360bbb-3(b)(1), unless the authorization is terminated  or revoked sooner.       Influenza A by PCR NEGATIVE NEGATIVE Final   Influenza B by PCR NEGATIVE NEGATIVE Final    Comment: (NOTE) The Xpert Xpress SARS-CoV-2/FLU/RSV plus assay is intended as an aid in the diagnosis of influenza from Nasopharyngeal swab specimens and should not be used as a sole basis for treatment. Nasal washings and aspirates are unacceptable for Xpert Xpress SARS-CoV-2/FLU/RSV testing.  Fact Sheet for Patients: EntrepreneurPulse.com.au  Fact Sheet for Healthcare Providers: IncredibleEmployment.be  This test is not yet approved or cleared by the [place] FDA and has been authorized for detection and/or diagnosis of SARS-CoV-2 by FDA under an Emergency Use Authorization (EUA). This EUA will remain in effect (meaning this test can be used) for the duration of the COVID-19 declaration under Section 564(b)(1) of the Act, 21 U.S.C. section 360bbb-3(b)(1), unless the authorization is terminated or revoked.  Performed at [facility], [address] [place], [place] [address]     Coagulation Studies: Recent Labs    05/28/21 0456 05/29/21 0435 05/30/21 0431  LABPROT 27.2* 26.7* 22.4*  INR 2.5* 2.5* 2.0*      Urinalysis: No results for input(s): COLORURINE, LABSPEC, PHURINE, GLUCOSEU, HGBUR, BILIRUBINUR, KETONESUR, PROTEINUR, UROBILINOGEN, NITRITE, LEUKOCYTESUR in the last 72 hours.  Invalid input(s): APPERANCEUR    Imaging: No results found.   Medications:     atorvastatin  10 mg Oral QPM   citalopram  40 mg Oral Daily   donepezil  5 mg Oral Daily   ferrous sulfate  325 mg Oral Q breakfast   insulin aspart  0-5 Units Subcutaneous QHS   insulin aspart  0-9 Units Subcutaneous TID WC   linaclotide  145 mcg Oral QAC breakfast   metoprolol tartrate  75 mg Oral BID   pantoprazole  40 mg Oral BID   polyethylene glycol  17 g Oral Daily   rOPINIRole  0.5 mg Oral QPM   cyanocobalamin  1,000 mcg Oral Daily   warfarin  2 mg Oral Once per day on Sun Tue Thu Sat   And   warfarin  4 mg Oral Once per day on Mon Wed Fri   Warfarin - Pharmacist Dosing Inpatient   Does not apply q1600   acetaminophen (TYLENOL) oral liquid 160 mg/5 mL, albuterol, dextromethorphan-guaiFENesin, melatonin, ondansetron (ZOFRAN) IV  Assessment/  Plan:  [name] is a 80 y.o.  female with past medical history of anemia, Atrial Fib, CHD, CHF diabetes, and hypertension. Patient reports having trouble breathing and having excess fluid. Patient will be admitted for Acute on chronic diastolic congestive heart failure (HCC) [I50.33] Acute on chronic congestive heart failure, unspecified heart failure type ([place]) [I50.9]   Chronic kidney disease stage 4 with fluctuating BUN and creatinine IV Lasix held  due to increased creatinine on 05/28/21 po Lasix 40 mg daily held today for increased creatinine  Due to fluctuating renal function, diuretic management will be challenging.  Agree with dosing furosemide every other day as outpatient Obtain labs as outpatient and follow-up in office Previously, patient has expressed that she is not interested in dialysis Increase in creatinine and BUN are likely due to  overdiuresis.  Expected to improve with correction of volume status.  Lab Results  Component Value Date   CREATININE 2.98 (H) 05/30/2021   CREATININE 2.89 (H) 05/29/2021   CREATININE 2.61 (H) 05/28/2021    Intake/Output Summary (Last 24 hours) at 05/30/2021 1418 Last data filed at 05/30/2021 0508 Gross per 24 hour  Intake --  Output 1050 ml  Net -1050 ml    2. Acute respiratory distress likely secondary to acute exacerbation of chronic diastolic heart failure.  Room air,  Furosemide to be restarted as outpatient      LOS: 6 Geralda Baumgardner 8/8/20222:18 PM

## 2021-05-30 NOTE — TOC Progression Note (Signed)
Transition of Care [facility]) - Progression Note    Patient Details  Name: CECLIA KOKER MRN: [id number] Date of Birth: 1941-09-05  Transition of Care [facility]) CM/SW [place], LCSW Phone Number: 05/30/2021, 11:46 AM  Clinical Narrative:   [place] arranged through [facility] Go)-- confirmed with [place]. Pt dc today.    Expected Discharge Plan: [place] Barriers to Discharge: Continued Medical Work up  Expected Discharge Plan and Services Expected Discharge Plan: [place] Choice: [place] arrangements for the past 2 months: Single Family Home Expected Discharge Date: 05/30/21                         HH Arranged: PT, RN           Social Determinants of Health (SDOH) Interventions    Readmission Risk Interventions Readmission Risk Prevention Plan 05/27/2021  Transportation Screening Complete  Medication Review [employer]) Complete  PCP or Specialist appointment within 3-5 days of discharge Complete  HRI or Home Care Consult Complete  SW Recovery Care/Counseling Consult Complete  Palliative Care Screening Complete  Skilled Nursing Facility Complete  Some recent data might be hidden

## 2021-05-30 NOTE — Plan of Care (Signed)
  Problem: Health Behavior/Discharge Planning: Goal: Ability to manage health-related needs will improve Outcome: Adequate for Discharge   Problem: Clinical Measurements: Goal: Ability to maintain clinical measurements within normal limits will improve Outcome: Adequate for Discharge   Problem: Clinical Measurements: Goal: Will remain free from infection Outcome: Adequate for Discharge   Problem: Clinical Measurements: Goal: Respiratory complications will improve Outcome: Adequate for Discharge   Problem: Clinical Measurements: Goal: Diagnostic test results will improve Outcome: Adequate for Discharge   Problem: Clinical Measurements: Goal: Cardiovascular complication will be avoided Outcome: Adequate for Discharge   Problem: Activity: Goal: Risk for activity intolerance will decrease Outcome: Adequate for Discharge

## 2021-05-30 NOTE — Progress Notes (Signed)
[place] for Warfarin management Indication: atrial fibrillation  No Known Allergies  Vital Signs: Temp: 97.9 F (36.6 C) (08/08 0727) Temp Source: Oral (08/08 0727) BP: 101/66 (08/08 0727) Pulse Rate: 90 (08/08 0727)  Labs: Recent Labs    05/28/21 0456 05/29/21 0435 05/30/21 0431  HGB 12.1 10.6* 10.7*  HCT 33.7* 30.9* 31.8*  PLT 229 224 214  LABPROT 27.2* 26.7* 22.4*  INR 2.5* 2.5* 2.0*  CREATININE 2.61* 2.89* 2.98*     Medical History: Past Medical History:  Diagnosis Date   Anemia    Atrial fibrillation (HCC)    Chronic kidney disease    Congestive heart failure (CHF) (HCC)    Diabetes mellitus without complication (HCC)    GERD (gastroesophageal reflux disease)    Hyperlipidemia    Hypertension    Pneumonia    Pulmonary fibrosis (HCC)       Assessment: 80YOF on anticoagulation for atrial fibrillation. Admitted on 8/2 with chest pain and shortness of breath. PMH includes HFpEF, CKD, HTN, HLD, pulmonary fibrosis, and atrial fibrillation on warfarin at home, taking warfarin 2 mg every day, except 4 mg on Monday Wednesday and Friday. Pharmacy has been consulted for warfarin management.   Patient home regimen:  4 mg on Mon/Wed/Sat; 2 mg on all other days  Pt last dose was 4 mg on 05/23/21  08/02 INR 2.3  therapeutic > 2mg  08/03 INR 2.1  therapeutic > 4 mg 08/04 INR 1.9  subtherapeutic > 4 mg  08/05 INR 2.1 therapeutic > 2 mg 08/06 INR 2.5 therapeutic > -- 08/07 INR 2.5 therapeutic > 2 mg 08/08 INR 2.0 therapeutic > 4mg   Goal of Therapy:  INR 2-3    Plan:  INR 2.0 therapeutic. Will continue with home dose of warfarin. Check INR with morning labs. If relatively stable, will decrease frequency of monitoring to ~ 72 hours. Monitor CBC per protocol   Lu Duffel, PharmD, BCPS Clinical Pharmacist 05/30/2021 7:42 AM

## 2021-05-30 NOTE — Progress Notes (Signed)
Pt is A&O, VS stable, afib on the monitor. [place] in place. No complaints of pain or discomfort tonight. Slept well. Plan for discharge today

## 2021-05-31 ENCOUNTER — Ambulatory Visit: Payer: Medicare HMO | Admitting: Internal Medicine

## 2021-05-31 ENCOUNTER — Encounter: Payer: Self-pay | Admitting: Internal Medicine

## 2021-05-31 ENCOUNTER — Other Ambulatory Visit: Payer: Self-pay | Admitting: Internal Medicine

## 2021-05-31 ENCOUNTER — Telehealth (INDEPENDENT_AMBULATORY_CARE_PROVIDER_SITE_OTHER): Payer: Medicare HMO | Admitting: Internal Medicine

## 2021-05-31 ENCOUNTER — Other Ambulatory Visit: Payer: Self-pay

## 2021-05-31 VITALS — HR 86 | Resp 16 | Ht 60.0 in | Wt 133.0 lb

## 2021-05-31 DIAGNOSIS — N184 Chronic kidney disease, stage 4 (severe): Secondary | ICD-10-CM | POA: Diagnosis not present

## 2021-05-31 DIAGNOSIS — I4821 Permanent atrial fibrillation: Secondary | ICD-10-CM

## 2021-05-31 DIAGNOSIS — D631 Anemia in chronic kidney disease: Secondary | ICD-10-CM | POA: Diagnosis not present

## 2021-05-31 DIAGNOSIS — I5033 Acute on chronic diastolic (congestive) heart failure: Secondary | ICD-10-CM | POA: Diagnosis not present

## 2021-05-31 DIAGNOSIS — K219 Gastro-esophageal reflux disease without esophagitis: Secondary | ICD-10-CM

## 2021-05-31 MED ORDER — FUROSEMIDE 20 MG PO TABS: 20.0000 mg | ORAL_TABLET | Freq: Every day | ORAL | 3 refills | Status: DC

## 2021-05-31 NOTE — Progress Notes (Addendum)
[facility] [place], [place] [address]  Internal MEDICINE  Telephone Visit  Patient Name: [name]  [id number]  [id number]  Date of Service: 06/07/2021  I connected with the patient at 1042am  by telephone and verified the patients identity using two identifiers.   I discussed the limitations, risks, security and privacy concerns of performing an evaluation and management service by telephone and the availability of in person appointments. I also discussed with the patient that there may be a patient responsible charge related to the service.  The patient expressed understanding and agrees to proceed.    Chief Complaint  Patient presents with   Hospitalization Follow-up    Discuss meds, INR 1.8   Telephone Assessment    Phone call   Telephone Screen    [phone]   Transitional care management note after hospitalization for acute congestive heart failure  HPI  Admission 05/24/2021 Discharge 05/30/2021 Pt is connected for follow up. She was diagnosed to have acute on chronic congestive heart failure. Patient was admitted on 2 August and was discharged on 8 August patient also had decompensated heart failure due to atrial fibrillation for many years patient has done well on digoxin as she was unable to tolerate metoprolol due to hypotension. Her Lanoxin has been stopped due to chronic kidney disease and risk of increased toxicity. Her metoprolol was increased to 75 mg twice a day initially she was on Lasix 40 mg once a day upon discharge it was changed to 20 mg alternating with Lasix of 40 mg once a day Patient is complaining of being dizzy and tired. She has not seen a cardiologist in a while but only has been referred to congestive heart failure clinic. Patient also has anemia of chronic kidney disease and at times she has had blood transfusion. Patient is also on chronic anticoagulation and the INR is maintained on the low therapeutic range due to risk  of GI bleed  Current Medication: Outpatient Encounter Medications as of 05/31/2021  Medication Sig Note   acetaminophen (TYLENOL) 325 MG tablet Take 650 mg by mouth every 6 (six) hours as needed.    atorvastatin (LIPITOR) 10 MG tablet Take 1 tablet (10 mg total) by mouth every evening. 05/24/2021: LF 04/26/21 #90   Blood Pressure Monitor KIT Use three times daily to check blood pressure  DX:  I50.42    citalopram (CELEXA) 40 MG tablet Take 1 tablet by mouth once daily 05/24/2021: LF 05/08/21 # 90   donepezil (ARICEPT) 5 MG tablet TAKE 1 TABLET BY MOUTH ONCE DAILY FOR MEMORY 05/24/2021: LF 04/14/21 #90   ferrous sulfate 325 (65 FE) MG tablet Take 1 tablet (325 mg total) by mouth daily with breakfast.    furosemide (LASIX) 20 MG tablet Take 1 tablet (20 mg total) by mouth daily. Take one tab on MW/F and take 2 for the rest of the week    linaclotide (LINZESS) 145 MCG CAPS capsule Take 1 capsule (145 mcg total) by mouth daily before breakfast. 05/24/2021: LF 04/19/21 #90   melatonin 3 MG TABS tablet Take 3 mg by mouth at bedtime as needed (sleep).    rOPINIRole (REQUIP) 0.5 MG tablet Take 1 tablet by mouth in the evening 05/24/2021: LF 7.17.22 #90   vitamin B-12 1000 MCG tablet Take 1 tablet (1,000 mcg total) by mouth daily. 05/24/2021: Not on pt med list from pharmacy   warfarin (COUMADIN) 2 MG tablet Take 1 tablet (2 mg total) by mouth daily.  Except on wed and sat take 4 mg 05/24/2021: LF 7.13.22 #40 for a 28 day supply   [DISCONTINUED] furosemide (LASIX) 20 MG tablet Take 1 tablet (20 mg total) by mouth every other day. Every Monday Wednesday and Friday only    [DISCONTINUED] Metoprolol Tartrate 75 MG TABS Take 75 mg by mouth 2 (two) times daily. (Patient taking differently: Take 75 mg by mouth 2 (two) times daily. Take 1/2 tablet twice daily)    [DISCONTINUED] pantoprazole (PROTONIX) 40 MG tablet Take 1 tablet (40 mg total) by mouth 2 (two) times daily. 05/24/2021: LF 01/17/21 #180   No facility-administered encounter  medications on file as of 05/31/2021.    Surgical History: Past Surgical History:  Procedure Laterality Date   ABDOMINAL HYSTERECTOMY     APPENDECTOMY     CATARACT EXTRACTION     CHOLECYSTECTOMY     HERNIA REPAIR     right knee replacement     right nephroectomy      Medical History: Past Medical History:  Diagnosis Date   Anemia    Atrial fibrillation (HCC)    Chronic kidney disease    Congestive heart failure (CHF) (HCC)    Diabetes mellitus without complication (HCC)    GERD (gastroesophageal reflux disease)    Hyperlipidemia    Hypertension    Pneumonia    Pulmonary fibrosis ([place])     Family History: Family History  Problem Relation Age of Onset   Diabetes Mother    Breast cancer Mother    Heart disease Father    Diabetes Father    Diabetes Sister    Diabetes Brother    Cancer Brother     Social History   Socioeconomic History   Marital status: Widowed    Spouse name: Not on file   Number of children: Not on file   Years of education: Not on file   Highest education level: Not on file  Occupational History   Occupation: retired  Tobacco Use   Smoking status: Former    Packs/day: 1.00    Types: Cigarettes    Quit date: 02/10/2021    Years since quitting: 0.3   Smokeless tobacco: Never  Vaping Use   Vaping Use: Never used  Substance and Sexual Activity   Alcohol use: No   Drug use: No   Sexual activity: Not Currently  Other Topics Concern   Not on file  Social History Narrative   Lives with daughter   Social Determinants of Health   Financial Resource Strain: Not on file  Food Insecurity: Not on file  Transportation Needs: Not on file  Physical Activity: Not on file  Stress: Not on file  Social Connections: Not on file  Intimate Partner Violence: Not on file      Review of Systems  Constitutional:  Positive for fatigue. Negative for chills and diaphoresis.  HENT:  Negative for ear pain, postnasal drip and sinus pressure.   Eyes:   Negative for photophobia, discharge, redness, itching and visual disturbance.  Respiratory:  Negative for cough, shortness of breath and wheezing.   Cardiovascular:  Negative for chest pain, palpitations and leg swelling.  Gastrointestinal:  Negative for abdominal pain, constipation, diarrhea, nausea and vomiting.  Genitourinary:  Negative for dysuria and flank pain.  Musculoskeletal:  Negative for arthralgias, back pain, gait problem and neck pain.  Skin:  Negative for color change.  Allergic/Immunologic: Negative for environmental allergies and food allergies.  Neurological:  Positive for weakness. Negative for dizziness and  headaches.  Hematological:  Does not bruise/bleed easily.  Psychiatric/Behavioral:  Negative for agitation, behavioral problems (depression) and hallucinations.    Vital Signs: Pulse 86   Resp 16   Ht 5' (1.524 m)   Wt 133 lb (60.3 kg)   SpO2 95%   BMI 25.97 kg/m    Observation/Objective: Pt looks pale.  She looks frail and weak there was no edema noticed in her lower extremities    assessment/Plan: 1. Acute on chronic diastolic congestive heart failure ([place]) Will continue on Lasix 47m alternating with 40 mg once a day, will look into getting her a cardiology appointment, will reduce metoprolol to half tablet twice a day until seen in the office  2. Permanent atrial fibrillation (HCC) Heart rate is controlled at this  3. Anemia associated with stage 4 chronic renal failure (HCC) Will order CBC and BMP, she is followed by nephrology  General Counseling: Kimberlly verbalizes understanding of the findings of today's phone visit and agrees with plan of treatment. I have discussed any further diagnostic evaluation that may be needed or ordered today. We also reviewed her medications today. she has been encouraged to call the office with any questions or concerns that should arise related to todays visit.  All medical records from the hospital were reviewed for  continued care Patient will have in-home rehab services she does not wish to continue to go to heart failure clinic however would like to see cardiology    Meds ordered this encounter  Medications   furosemide (LASIX) 20 MG tablet    Sig: Take 1 tablet (20 mg total) by mouth daily. Take one tab on MW/F and take 2 for the rest of the week    Dispense:  120 tablet    Refill:  3    Time spent:45 Minutes    Dr FLavera GuiseInternal medicine

## 2021-06-01 ENCOUNTER — Telehealth: Payer: Self-pay | Admitting: Student

## 2021-06-01 NOTE — Discharge Summary (Signed)
at [facility] NAME: Delrae Hagey    MR#:  [id number]  DATE OF BIRTH:  02/11/1941  DATE OF ADMISSION:  05/24/2021   ADMITTING PHYSICIAN: Ivor Costa, MD  DATE OF DISCHARGE: 05/30/2021 12:52 PM  PRIMARY CARE PHYSICIAN: Lavera Guise, MD   ADMISSION DIAGNOSIS:  Acute on chronic diastolic congestive heart failure (HCC) [I50.33] Acute on chronic congestive heart failure, unspecified heart failure type ([place]) [I50.9] DISCHARGE DIAGNOSIS:  Principal Problem:   Acute on chronic diastolic congestive heart failure (HCC) Active Problems:   GERD (gastroesophageal reflux disease)   Atrial fibrillation (HCC)   CKD stage 4 due to type 2 diabetes mellitus (HCC)   Essential hypertension   Anemia associated with stage 4 chronic renal failure (HCC)   Type II diabetes mellitus with renal manifestations (HCC)   HLD (hyperlipidemia)   Depression   Legally blind   Abdominal pain  SECONDARY DIAGNOSIS:   Past Medical History:  Diagnosis Date   Anemia    Atrial fibrillation (HCC)    Chronic kidney disease    Congestive heart failure (CHF) (HCC)    Diabetes mellitus without complication (HCC)    GERD (gastroesophageal reflux disease)    Hyperlipidemia    Hypertension    Pneumonia    Pulmonary fibrosis ([place])    HOSPITAL COURSE:  80 y.o. female with medical history significant of HTN, HLD, DM, pulmonary fibrosis, dCHF, s/p of right nephrectomy, CKD stage IV, paroxysmal atrial fibrillation on Coumadin, anemia of chronic kidney disease, colitis, GERD, depression, dementia, legally blindness, admitted for worsening shortness breath and weight gain.   Acute on chronic diastolic congestive heart failure ([place]): 2D echo on 12/22/2020 showed EF of 60 to 65%.  Patient had elevated BNP 1334 on admission, interstitial pulmonary edema on chest x-ray, -2d echo on this admission also showed normal LV systolic function -Net IO Since Admission: -7,705 mL [06/01/21 1547]    Abdominal  pain: transient and resolved, could be gas related. Normal lipase -CT scan of abdomen/pelvis without contrast within normal limits   GERD (gastroesophageal reflux disease) -Continue Protonix   Paroxysmal atrial fibrillation ([place]): -Continue Coumadin.  Therapeutic INR -Rate well controlled with metoprolol 75 mg p.o. twice daily   CKD stage 4 due to type 2 diabetes mellitus ([place]): -Creatinine close to baseline at D/C. Nephro and cardio recommend every other day lasix at D/C and close f/up with outpt providers and monitoring of labs   Essential hypertension:  Blood pressure controlled with Lopressor 75 mg p.o. twice daily   Anemia associated with stage 4 chronic renal failure ([place]): -stable   Diet controlled Type II diabetes mellitus with renal manifestations [facility]): Recent A1c 6.6, Treated with sliding scale insulin while in the hospital   HLD (hyperlipidemia) -Lipitor   Depression: -Celexa   Legally blind -Fall precaution   Moderate protein calorie malnutrition Body mass index is 25.44 kg/m. She has muscle wasting  DISCHARGE CONDITIONS:  stable CONSULTS OBTAINED:   DRUG ALLERGIES:  No Known Allergies DISCHARGE MEDICATIONS:   Allergies as of 05/30/2021   No Known Allergies      Medication List     STOP taking these medications    azithromycin 250 MG tablet Commonly known as: Zithromax   Dulcolax 5 MG EC tablet Generic drug: bisacodyl   fluticasone 50 MCG/ACT nasal spray Commonly known as: FLONASE   furosemide 40 MG tablet Commonly known as: Lasix   hydrALAZINE 25 MG tablet Commonly known as: APRESOLINE   polyethylene  glycol 17 g packet Commonly known as: MiraLax   traMADol 50 MG tablet Commonly known as: ULTRAM       TAKE these medications    acetaminophen 325 MG tablet Commonly known as: TYLENOL Take 650 mg by mouth every 6 (six) hours as needed.   atorvastatin 10 MG tablet Commonly known as: LIPITOR Take 1 tablet (10 mg total) by mouth  every evening.   Blood Pressure Monitor Kit Use three times daily to check blood pressure  DX:  I50.42   citalopram 40 MG tablet Commonly known as: CELEXA Take 1 tablet by mouth once daily   cyanocobalamin 1000 MCG tablet Take 1 tablet (1,000 mcg total) by mouth daily.   donepezil 5 MG tablet Commonly known as: ARICEPT TAKE 1 TABLET BY MOUTH ONCE DAILY FOR MEMORY What changed:  how much to take how to take this when to take this   ferrous sulfate 325 (65 FE) MG tablet Take 1 tablet (325 mg total) by mouth daily with breakfast.   linaclotide 145 MCG Caps capsule Commonly known as: Linzess Take 1 capsule (145 mcg total) by mouth daily before breakfast.   melatonin 3 MG Tabs tablet Take 3 mg by mouth at bedtime as needed (sleep).   Metoprolol Tartrate 75 MG Tabs Take 75 mg by mouth 2 (two) times daily.   rOPINIRole 0.5 MG tablet Commonly known as: REQUIP Take 1 tablet by mouth in the evening   warfarin 2 MG tablet Commonly known as: COUMADIN Take 1 tablet (2 mg total) by mouth daily. Except on wed and sat take 4 mg       DISCHARGE INSTRUCTIONS:  BMP, PT-INR to be drawn on 8/10 with [facility] RN with results to PCP, Nephro for meds adjustment if need DIET:  Cardiac diet DISCHARGE CONDITION:  Stable ACTIVITY:  Activity as tolerated OXYGEN:  Home Oxygen: No.  Oxygen Delivery: room air DISCHARGE LOCATION:  Home with HHPT, OT, RN  If you experience worsening of your admission symptoms, develop shortness of breath, life threatening emergency, suicidal or homicidal thoughts you must seek medical attention immediately by calling 911 or calling your MD immediately  if symptoms less severe.  You Must read complete instructions/literature along with all the possible adverse reactions/side effects for all the Medicines you take and that have been prescribed to you. Take any new Medicines after you have completely understood and accpet all the possible adverse reactions/side  effects.   Please note  You were cared for by a hospitalist during your hospital stay. If you have any questions about your discharge medications or the care you received while you were in the hospital after you are discharged, you can call the unit and asked to speak with the hospitalist on call if the hospitalist that took care of you is not available. Once you are discharged, your primary care physician will handle any further medical issues. Please note that NO REFILLS for any discharge medications will be authorized once you are discharged, as it is imperative that you return to your primary care physician (or establish a relationship with a primary care physician if you do not have one) for your aftercare needs so that they can reassess your need for medications and monitor your lab values.    On the day of Discharge:  VITAL SIGNS:  Blood pressure 121/60, pulse 90, temperature 97.9 F (36.6 C), temperature source Oral, resp. rate 20, height '4\' 10"'  (1.473 m), weight 55.8 kg, SpO2 98 %. PHYSICAL EXAMINATION:  GENERAL:  80 y.o.-year-old patient lying in the bed with no acute distress.  EYES: Pupils equal, round, reactive to light and accommodation. No scleral icterus. Extraocular muscles intact.  HEENT: Head atraumatic, normocephalic. Oropharynx and nasopharynx clear.  NECK:  Supple, no jugular venous distention. No thyroid enlargement, no tenderness.  LUNGS: Normal breath sounds bilaterally, no wheezing, rales,rhonchi or crepitation. No use of accessory muscles of respiration.  CARDIOVASCULAR: S1, S2 normal. No murmurs, rubs, or gallops.  ABDOMEN: Soft, non-tender, non-distended. Bowel sounds present. No organomegaly or mass.  EXTREMITIES: No pedal edema, cyanosis, or clubbing.  NEUROLOGIC: Cranial nerves II through XII are intact. Muscle strength 5/5 in all extremities. Sensation intact. Gait not checked.  PSYCHIATRIC: The patient is alert and oriented x 3.  SKIN: No obvious rash, lesion,  or ulcer.  DATA REVIEW:   CBC Recent Labs  Lab 05/30/21 0431  WBC 9.7  HGB 10.7*  HCT 31.8*  PLT 214    Chemistries  Recent Labs  Lab 05/30/21 0431  NA 137  K 4.2  CL 99  CO2 28  GLUCOSE 148*  BUN 109*  CREATININE 2.98*  CALCIUM 9.0     Outpatient follow-up  Follow-up Information     Lavera Guise, MD. Schedule an appointment as soon as possible for a visit on 05/31/2021.   Specialties: Internal Medicine, Cardiology Why: @ 10:20am Contact information: [place] [address] [phone]         Lavonia Dana, MD. Schedule an appointment as soon as possible for a visit on 06/08/2021.   Specialty: Nephrology Why: [facility] Discharge F/UP.Marland Kitchen@ 2pm Contact information: [place] [address] [phone]                 30 Day Unplanned Readmission Risk Score    Flowsheet Row ED to Hosp-Admission (Discharged) from 05/24/2021 in [facility]  30 Day Unplanned Readmission Risk Score (%) 32.5 Filed at 05/30/2021 1200       This score is the patient's risk of an unplanned readmission within 30 days of being discharged (0 -100%). The score is based on dignosis, age, lab data, medications, orders, and past utilization.   Low:  0-14.9   Medium: 15-21.9   High: 22-29.9   Extreme: 30 and above          Management plans discussed with the patient, family and they are in agreement.  CODE STATUS: Prior   TOTAL TIME TAKING CARE OF THIS PATIENT: 45 minutes.    Max Sane M.D on 06/01/2021 at 3:34 PM  [facility]   CC: Primary care physician; Lavera Guise, MD   Note: This dictation was prepared with Dragon dictation along with smaller phrase technology. Any transcriptional errors that result from this process are unintentional.

## 2021-06-01 NOTE — Telephone Encounter (Signed)
Attempted to reach patient's daughter June, to schedule Palliative Consult, no answer - left message requesting a return call to schedule visit, left my contact information.

## 2021-06-01 NOTE — Telephone Encounter (Signed)
Attempted to contact patient at listed home number to offer to schedule Palliative Consult, no answer - left message with reason for call along with my name and call back number.  I also called patient's daughter's ([name]) cell and a gentleman answered and I asked to speak with June and he requested I call back in about 30 minutes that he was on his way to pick her up from work.

## 2021-06-03 ENCOUNTER — Telehealth: Payer: Self-pay | Admitting: Student

## 2021-06-03 DIAGNOSIS — E785 Hyperlipidemia, unspecified: Secondary | ICD-10-CM | POA: Diagnosis not present

## 2021-06-03 DIAGNOSIS — D631 Anemia in chronic kidney disease: Secondary | ICD-10-CM | POA: Diagnosis not present

## 2021-06-03 DIAGNOSIS — E1122 Type 2 diabetes mellitus with diabetic chronic kidney disease: Secondary | ICD-10-CM | POA: Diagnosis not present

## 2021-06-03 DIAGNOSIS — I5043 Acute on chronic combined systolic (congestive) and diastolic (congestive) heart failure: Secondary | ICD-10-CM | POA: Diagnosis not present

## 2021-06-03 DIAGNOSIS — N185 Chronic kidney disease, stage 5: Secondary | ICD-10-CM | POA: Diagnosis not present

## 2021-06-03 DIAGNOSIS — I130 Hypertensive heart and chronic kidney disease with heart failure and stage 1 through stage 4 chronic kidney disease, or unspecified chronic kidney disease: Secondary | ICD-10-CM | POA: Diagnosis not present

## 2021-06-03 DIAGNOSIS — J9611 Chronic respiratory failure with hypoxia: Secondary | ICD-10-CM | POA: Diagnosis not present

## 2021-06-03 DIAGNOSIS — K219 Gastro-esophageal reflux disease without esophagitis: Secondary | ICD-10-CM | POA: Diagnosis not present

## 2021-06-03 DIAGNOSIS — M1990 Unspecified osteoarthritis, unspecified site: Secondary | ICD-10-CM | POA: Diagnosis not present

## 2021-06-03 NOTE — Telephone Encounter (Signed)
Called patient's home number and spoke with patient's daughter, June Lovena Le, (who lives with patient) regarding the Palliative referral/services and all questions were answered and she was in agreement with scheduling visit.  Visit was scheduled for 06/17/21 @ 9:30 AM.

## 2021-06-06 ENCOUNTER — Encounter: Payer: Self-pay | Admitting: Internal Medicine

## 2021-06-06 ENCOUNTER — Telehealth: Payer: Self-pay

## 2021-06-06 ENCOUNTER — Other Ambulatory Visit: Payer: Self-pay

## 2021-06-06 ENCOUNTER — Ambulatory Visit (INDEPENDENT_AMBULATORY_CARE_PROVIDER_SITE_OTHER): Payer: Medicare HMO | Admitting: Internal Medicine

## 2021-06-06 VITALS — BP 121/83 | HR 76 | Temp 98.3°F | Resp 16 | Ht <= 58 in | Wt 124.8 lb

## 2021-06-06 DIAGNOSIS — I4821 Permanent atrial fibrillation: Secondary | ICD-10-CM

## 2021-06-06 DIAGNOSIS — N1832 Chronic kidney disease, stage 3b: Secondary | ICD-10-CM | POA: Diagnosis not present

## 2021-06-06 DIAGNOSIS — I5042 Chronic combined systolic (congestive) and diastolic (congestive) heart failure: Secondary | ICD-10-CM | POA: Diagnosis not present

## 2021-06-06 MED ORDER — METOPROLOL TARTRATE 75 MG PO TABS: ORAL_TABLET | ORAL | 1 refills | Status: DC

## 2021-06-06 NOTE — Progress Notes (Signed)
[facility] [place], [place] [address]  Internal MEDICINE  Office Visit Note  Patient Name: [name]  [id number]  [id number]  Date of Service: 06/09/2021  Chief Complaint  Patient presents with   Hospitalization Follow-up    HPI  Pt is here with her daughter, hospital admission from 8/2-8/8. Feels like having flutter and was unable to sleep last night, pt has been on Lanoxin in the past with success however has been stopped due to elevated renal functions   She was diagnosed to have acute on chronic congestive heart failure. Patient was admitted on 2 August and was discharged on 8 August patient also had decompensated heart failure due to atrial fibrillation for many years patient has done well on digoxin as she was unable to tolerate metoprolol  Her metoprolol was increased to 75 mg twice a day initially she was on Lasix 40 mg once a day upon discharge it was changed to 20 mg alternating with Lasix of 40 mg once a day Patient is complaining of being dizzy and tired. She has not seen a cardiologist in a while but only has been referred to congestive heart failure clinic. Patient also has anemia of chronic kidney disease and at times she has had blood transfusion. Patient is also on chronic anticoagulation and the INR is maintained on the low therapeutic range due to risk of GI bleed  Current Medication: Outpatient Encounter Medications as of 06/06/2021  Medication Sig Note   acetaminophen (TYLENOL) 325 MG tablet Take 650 mg by mouth every 6 (six) hours as needed.    atorvastatin (LIPITOR) 10 MG tablet Take 1 tablet (10 mg total) by mouth every evening. 05/24/2021: LF 04/26/21 #90   Blood Pressure Monitor KIT Use three times daily to check blood pressure  DX:  I50.42    citalopram (CELEXA) 40 MG tablet Take 1 tablet by mouth once daily 05/24/2021: LF 05/08/21 # 90   donepezil (ARICEPT) 5 MG tablet TAKE 1 TABLET BY MOUTH ONCE DAILY FOR MEMORY 05/24/2021: LF  04/14/21 #90   ferrous sulfate 325 (65 FE) MG tablet Take 1 tablet (325 mg total) by mouth daily with breakfast.    furosemide (LASIX) 20 MG tablet Take 1 tablet (20 mg total) by mouth daily. Take one tab on MW/F and take 2 for the rest of the week    linaclotide (LINZESS) 145 MCG CAPS capsule Take 1 capsule (145 mcg total) by mouth daily before breakfast. 05/24/2021: LF 04/19/21 #90   melatonin 3 MG TABS tablet Take 3 mg by mouth at bedtime as needed (sleep).    Metoprolol Tartrate 75 MG TABS Take one tab po bid for atrial fib    pantoprazole (PROTONIX) 40 MG tablet Take 1 tablet by mouth twice daily    rOPINIRole (REQUIP) 0.5 MG tablet Take 1 tablet by mouth in the evening 05/24/2021: LF 7.17.22 #90   vitamin B-12 1000 MCG tablet Take 1 tablet (1,000 mcg total) by mouth daily. 05/24/2021: Not on pt med list from pharmacy   warfarin (COUMADIN) 2 MG tablet Take 1 tablet (2 mg total) by mouth daily. Except on wed and sat take 4 mg 05/24/2021: LF 7.13.22 #40 for a 28 day supply   [DISCONTINUED] Metoprolol Tartrate 75 MG TABS Take 75 mg by mouth 2 (two) times daily. (Patient taking differently: Take 75 mg by mouth 2 (two) times daily. Take 1/2 tablet twice daily)    No facility-administered encounter medications on file as of 06/06/2021.  Surgical History: Past Surgical History:  Procedure Laterality Date   ABDOMINAL HYSTERECTOMY     APPENDECTOMY     CATARACT EXTRACTION     CHOLECYSTECTOMY     HERNIA REPAIR     right knee replacement     right nephroectomy      Medical History: Past Medical History:  Diagnosis Date   Anemia    Atrial fibrillation (HCC)    Chronic kidney disease    Congestive heart failure (CHF) (HCC)    Diabetes mellitus without complication (HCC)    GERD (gastroesophageal reflux disease)    Hyperlipidemia    Hypertension    Pneumonia    Pulmonary fibrosis ([place])     Family History: Family History  Problem Relation Age of Onset   Diabetes Mother    Breast cancer  Mother    Heart disease Father    Diabetes Father    Diabetes Sister    Diabetes Brother    Cancer Brother     Social History   Socioeconomic History   Marital status: Widowed    Spouse name: Not on file   Number of children: Not on file   Years of education: Not on file   Highest education level: Not on file  Occupational History   Occupation: retired  Tobacco Use   Smoking status: Former    Packs/day: 1.00    Types: Cigarettes    Quit date: 02/10/2021    Years since quitting: 0.3   Smokeless tobacco: Never  Vaping Use   Vaping Use: Never used  Substance and Sexual Activity   Alcohol use: No   Drug use: No   Sexual activity: Not Currently  Other Topics Concern   Not on file  Social History Narrative   Lives with daughter   Social Determinants of Health   Financial Resource Strain: Not on file  Food Insecurity: Not on file  Transportation Needs: Not on file  Physical Activity: Not on file  Stress: Not on file  Social Connections: Not on file  Intimate Partner Violence: Not on file      Review of Systems  Constitutional:  Negative for chills, diaphoresis and fatigue.  HENT:  Negative for ear pain, postnasal drip and sinus pressure.   Eyes:  Negative for photophobia, discharge, redness, itching and visual disturbance.  Respiratory:  Negative for cough, shortness of breath and wheezing.   Cardiovascular:  Positive for palpitations. Negative for chest pain and leg swelling.  Gastrointestinal:  Negative for abdominal pain, constipation, diarrhea, nausea and vomiting.  Genitourinary:  Negative for dysuria and flank pain.  Musculoskeletal:  Negative for arthralgias, back pain, gait problem and neck pain.  Skin:  Negative for color change.  Allergic/Immunologic: Negative for environmental allergies and food allergies.  Neurological:  Positive for weakness. Negative for dizziness and headaches.  Hematological:  Does not bruise/bleed easily.  Psychiatric/Behavioral:   Negative for agitation, behavioral problems (depression) and hallucinations.    Vital Signs: BP 121/83   Pulse 76   Temp 98.3 F (36.8 C)   Resp 16   Ht '4\' 10"'  (1.473 m)   Wt 124 lb 12.8 oz (56.6 kg)   SpO2 96%   BMI 26.08 kg/m    Physical Exam Constitutional:      Appearance: Normal appearance.  HENT:     Head: Normocephalic and atraumatic.     Nose: Nose normal.     Mouth/Throat:     Mouth: Mucous membranes are moist.     Pharynx:  No posterior oropharyngeal erythema.  Eyes:     Extraocular Movements: Extraocular movements intact.     Pupils: Pupils are equal, round, and reactive to light.  Cardiovascular:     Rate and Rhythm: Rhythm irregular.     Pulses: Normal pulses.     Heart sounds: Normal heart sounds.  Pulmonary:     Effort: Pulmonary effort is normal.     Breath sounds: Normal breath sounds.  Skin:    General: Skin is warm.  Neurological:     General: No focal deficit present.     Mental Status: She is alert.  Psychiatric:        Mood and Affect: Mood normal.        Behavior: Behavior normal.       Assessment/Plan: 1. Chronic combined systolic and diastolic heart failure (HCC) Pt has been hospitalized multiple times, has not been seen by cardiology in outpt setting, will refer, will continue on metoprolol 75 mg po bid for now, lasix 20 mg po qd, monitor wt, if gain of 4 lbs overnight will take extra dose of lasix  - Ambulatory referral to Cardiology - Basic Metabolic Panel (BMET) - CBC with Differential/Platelet  2. Permanent atrial fibrillation (HCC) Pt was on Lanoxin before and has done well however was stopped due to elevated renal functions and risk of toxicity, pt does become hypotensive with metoprolol and feels fatigued. She is on Coumadin( low therapeutic range due to risk of GI bleed  - Ambulatory referral to Cardiology  3. Stage 3b chronic kidney disease ([place]) Followed by nephrology. Lasix 20 mg po qd    General Counseling: [name]  verbalizes understanding of the findings of todays visit and agrees with plan of treatment. I have discussed any further diagnostic evaluation that may be needed or ordered today. We also reviewed her medications today. she has been encouraged to call the office with any questions or concerns that should arise related to todays visit.    Orders Placed This Encounter  Procedures   Basic Metabolic Panel (BMET)   CBC with Differential/Platelet   CBC with Differential/Platelet   Basic metabolic panel   Ambulatory referral to Cardiology    Meds ordered this encounter  Medications   Metoprolol Tartrate 75 MG TABS    Sig: Take one tab po bid for atrial fib    Dispense:  180 tablet    Refill:  1    Total time spent:30 Minutes Time spent includes review of chart, medications, test results, and follow up plan with the patient.   [place] Controlled Substance Database was reviewed by me.   Dr Lavera Guise Internal medicine

## 2021-06-06 NOTE — Telephone Encounter (Signed)
Faxed letter to [facility] [phone] attn: Sheila/tat

## 2021-06-07 ENCOUNTER — Ambulatory Visit (INDEPENDENT_AMBULATORY_CARE_PROVIDER_SITE_OTHER): Payer: Medicare HMO

## 2021-06-07 DIAGNOSIS — Z7901 Long term (current) use of anticoagulants: Secondary | ICD-10-CM | POA: Diagnosis not present

## 2021-06-07 LAB — CBC WITH DIFFERENTIAL/PLATELET
Basophils Absolute: 0.1 10*3/uL (ref 0.0–0.2)
Basos: 1 %
EOS (ABSOLUTE): 0.2 10*3/uL (ref 0.0–0.4)
Eos: 2 %
Hematocrit: 29.5 % — ABNORMAL LOW (ref 34.0–46.6)
Hemoglobin: 10 g/dL — ABNORMAL LOW (ref 11.1–15.9)
Immature Grans (Abs): 0.1 10*3/uL (ref 0.0–0.1)
Immature Granulocytes: 1 %
Lymphocytes Absolute: 2.8 10*3/uL (ref 0.7–3.1)
Lymphs: 33 %
MCH: 32.5 pg (ref 26.6–33.0)
MCHC: 33.9 g/dL (ref 31.5–35.7)
MCV: 96 fL (ref 79–97)
Monocytes Absolute: 0.5 10*3/uL (ref 0.1–0.9)
Monocytes: 6 %
Neutrophils Absolute: 4.9 10*3/uL (ref 1.4–7.0)
Neutrophils: 57 %
Platelets: 197 10*3/uL (ref 150–450)
RBC: 3.08 x10E6/uL — ABNORMAL LOW (ref 3.77–5.28)
RDW: 13.9 % (ref 11.7–15.4)
WBC: 8.5 10*3/uL (ref 3.4–10.8)

## 2021-06-07 LAB — BASIC METABOLIC PANEL
BUN/Creatinine Ratio: 21 (ref 12–28)
BUN: 42 mg/dL — ABNORMAL HIGH (ref 8–27)
CO2: 25 mmol/L (ref 20–29)
Calcium: 8.7 mg/dL (ref 8.7–10.3)
Chloride: 106 mmol/L (ref 96–106)
Creatinine, Ser: 1.99 mg/dL — ABNORMAL HIGH (ref 0.57–1.00)
Glucose: 126 mg/dL — ABNORMAL HIGH (ref 65–99)
Potassium: 3.8 mmol/L (ref 3.5–5.2)
Sodium: 139 mmol/L (ref 134–144)
eGFR: 25 mL/min/{1.73_m2} — ABNORMAL LOW (ref >59)

## 2021-06-07 LAB — PROTIME-INR

## 2021-06-07 NOTE — Progress Notes (Signed)
Pt INR 1.9 as per dr Humphrey Rolls continue same med as prescribed and lmom to pt daughter no change on treatment continue same me d

## 2021-06-08 ENCOUNTER — Ambulatory Visit: Payer: Medicare HMO | Admitting: Family

## 2021-06-10 ENCOUNTER — Telehealth: Payer: Self-pay

## 2021-06-10 DIAGNOSIS — N185 Chronic kidney disease, stage 5: Secondary | ICD-10-CM | POA: Diagnosis not present

## 2021-06-10 DIAGNOSIS — J9611 Chronic respiratory failure with hypoxia: Secondary | ICD-10-CM | POA: Diagnosis not present

## 2021-06-10 DIAGNOSIS — M1990 Unspecified osteoarthritis, unspecified site: Secondary | ICD-10-CM | POA: Diagnosis not present

## 2021-06-10 DIAGNOSIS — E1122 Type 2 diabetes mellitus with diabetic chronic kidney disease: Secondary | ICD-10-CM | POA: Diagnosis not present

## 2021-06-10 DIAGNOSIS — E785 Hyperlipidemia, unspecified: Secondary | ICD-10-CM | POA: Diagnosis not present

## 2021-06-10 DIAGNOSIS — K219 Gastro-esophageal reflux disease without esophagitis: Secondary | ICD-10-CM | POA: Diagnosis not present

## 2021-06-10 DIAGNOSIS — D631 Anemia in chronic kidney disease: Secondary | ICD-10-CM | POA: Diagnosis not present

## 2021-06-10 DIAGNOSIS — I130 Hypertensive heart and chronic kidney disease with heart failure and stage 1 through stage 4 chronic kidney disease, or unspecified chronic kidney disease: Secondary | ICD-10-CM | POA: Diagnosis not present

## 2021-06-10 DIAGNOSIS — I5043 Acute on chronic combined systolic (congestive) and diastolic (congestive) heart failure: Secondary | ICD-10-CM | POA: Diagnosis not present

## 2021-06-10 NOTE — Progress Notes (Signed)
Please inform daughter that her labs look better and will continue same medicine as discussed in office visit

## 2021-06-10 NOTE — Telephone Encounter (Signed)
-----   Message from Lavera Guise, MD sent at 06/10/2021 10:35 AM EDT ----- Please inform daughter that her labs look better and will continue same medicine as discussed in office visit

## 2021-06-10 NOTE — Telephone Encounter (Signed)
Spoke to pt's daughter and advised pt's labs look better and to continue same meds as discussed at office visit

## 2021-06-12 DIAGNOSIS — I5033 Acute on chronic diastolic (congestive) heart failure: Secondary | ICD-10-CM | POA: Diagnosis not present

## 2021-06-12 DIAGNOSIS — I509 Heart failure, unspecified: Secondary | ICD-10-CM | POA: Diagnosis not present

## 2021-06-13 ENCOUNTER — Ambulatory Visit: Payer: Medicare HMO | Admitting: Internal Medicine

## 2021-06-14 LAB — PROTIME-INR

## 2021-06-15 ENCOUNTER — Ambulatory Visit (INDEPENDENT_AMBULATORY_CARE_PROVIDER_SITE_OTHER): Payer: Medicare HMO

## 2021-06-15 ENCOUNTER — Other Ambulatory Visit: Payer: Self-pay

## 2021-06-15 DIAGNOSIS — Z7901 Long term (current) use of anticoagulants: Secondary | ICD-10-CM | POA: Diagnosis not present

## 2021-06-15 NOTE — Progress Notes (Signed)
Pt INR 3.1 as per  dr Humphrey Rolls no change continue same med for now as prescribed

## 2021-06-16 DIAGNOSIS — I509 Heart failure, unspecified: Secondary | ICD-10-CM | POA: Diagnosis not present

## 2021-06-16 DIAGNOSIS — I5033 Acute on chronic diastolic (congestive) heart failure: Secondary | ICD-10-CM | POA: Diagnosis not present

## 2021-06-16 LAB — PROTIME-INR

## 2021-06-17 ENCOUNTER — Telehealth: Payer: Self-pay

## 2021-06-17 ENCOUNTER — Encounter: Payer: Self-pay | Admitting: Student

## 2021-06-17 ENCOUNTER — Other Ambulatory Visit: Payer: Self-pay

## 2021-06-17 ENCOUNTER — Other Ambulatory Visit: Payer: Medicare HMO | Admitting: Student

## 2021-06-17 VITALS — BP 112/54 | HR 54 | Wt 130.0 lb

## 2021-06-17 DIAGNOSIS — R001 Bradycardia, unspecified: Secondary | ICD-10-CM

## 2021-06-17 DIAGNOSIS — I130 Hypertensive heart and chronic kidney disease with heart failure and stage 1 through stage 4 chronic kidney disease, or unspecified chronic kidney disease: Secondary | ICD-10-CM | POA: Diagnosis not present

## 2021-06-17 DIAGNOSIS — I5033 Acute on chronic diastolic (congestive) heart failure: Secondary | ICD-10-CM

## 2021-06-17 DIAGNOSIS — N185 Chronic kidney disease, stage 5: Secondary | ICD-10-CM | POA: Diagnosis not present

## 2021-06-17 DIAGNOSIS — Z515 Encounter for palliative care: Secondary | ICD-10-CM

## 2021-06-17 DIAGNOSIS — E1122 Type 2 diabetes mellitus with diabetic chronic kidney disease: Secondary | ICD-10-CM | POA: Diagnosis not present

## 2021-06-17 DIAGNOSIS — D631 Anemia in chronic kidney disease: Secondary | ICD-10-CM | POA: Diagnosis not present

## 2021-06-17 DIAGNOSIS — E785 Hyperlipidemia, unspecified: Secondary | ICD-10-CM | POA: Diagnosis not present

## 2021-06-17 DIAGNOSIS — R531 Weakness: Secondary | ICD-10-CM | POA: Diagnosis not present

## 2021-06-17 DIAGNOSIS — J9611 Chronic respiratory failure with hypoxia: Secondary | ICD-10-CM | POA: Diagnosis not present

## 2021-06-17 DIAGNOSIS — M1990 Unspecified osteoarthritis, unspecified site: Secondary | ICD-10-CM | POA: Diagnosis not present

## 2021-06-17 DIAGNOSIS — I5043 Acute on chronic combined systolic (congestive) and diastolic (congestive) heart failure: Secondary | ICD-10-CM | POA: Diagnosis not present

## 2021-06-17 DIAGNOSIS — K219 Gastro-esophageal reflux disease without esophagitis: Secondary | ICD-10-CM | POA: Diagnosis not present

## 2021-06-17 NOTE — Progress Notes (Signed)
[facility] Consult Note Telephone: [phone]  Fax: [phone]   Date of encounter: 06/17/21 9:49 AM PATIENT NAME: [name] [address] [place] [address]   [phone] (home)  DOB: July 16, 1941 MRN: [id number] PRIMARY CARE PROVIDER:    Lavera Guise, MD,  [place] [address] [phone]  REFERRING PROVIDER:   Lavera Guise, [place],  [place] [address] [phone]  RESPONSIBLE PARTY:    Contact Information     Name Relation Home Work Mobile   Rod Can Daughter [phone]  [phone]        I met face to face with patient and family in the home. Palliative Care was asked to follow this patient by consultation request of  Lavera Guise, MD to address advance care planning and complex medical decision making. This is the initial visit.                                     ASSESSMENT AND PLAN / RECOMMENDATIONS:   Advance Care Planning/Goals of Care: Goals include to maximize quality of life and symptom management. Patient/health care surrogate gave his/her permission to discuss.Our advance care planning conversation included a discussion about:    The value and importance of advance care planning  Experiences with loved ones who have been seriously ill or have died  Exploration of personal, cultural or spiritual beliefs that might influence medical decisions  Exploration of goals of care in the event of a sudden injury or illness  Identification and preparation of a healthcare agent; daughter has paperwork.  Reviewed code status; patient wishes to remain a DNR. CODE STATUS: DNR  Symptom Management/Plan:  Heart failure-appears euvolemic. No edema presently. Shortness of breath with exertion. Continue oxygen PRN at 2 lpm via nasal canula. Furosemide as directed per PCP. Continue daily weights.   Generalized weakness-continue therapy as  directed; recommend using assistive device for ambulation. Family to continue to provide supportive care.   Bradycardia-PCP notified to make her aware of pulse in 54 with current metoprolol dose of 75 mg BID; recommend metoprolol being decreased to 50 mg BID; follow up with PCP. Recommend daughter to check blood pressure twice daily.  Follow up Palliative Care Visit: Palliative care will continue to follow for complex medical decision making, advance care planning, and clarification of goals. Return in 8  weeks or prn.  I spent 60 minutes providing this consultation. More than 50% of the time in this consultation was spent in counseling and care coordination.  PPS: 50%  HOSPICE ELIGIBILITY/DIAGNOSIS: TBD  Chief Complaint: Palliative Medicine initial visit.   HISTORY OF PRESENT ILLNESS:  [name] is a 80 y.o. year old female  with acute on chronic heart failure, atrial fibrillation, T2DM, CKD 4, essential hypertension, anemia, hyperlipidemia, depression, left solitary kidney. Recent hospitalization 8/2-05/30/21 due to acute on chronic diastolic congestive heart failure, atrial fibrillation, CKD stage 4.  Patient currently resides at home with family. She is currently receiving PT weekly. She denies pain, nausea. She endorses shortness of breath with exertion; denies at rest. Wears oxygen occasionally at 2 lpm. Blood sugar checks each morning; usually  around 100 mg/dL. Good appetite reported. Has a walker and cane; no falls reported. Daily weights. She has received 37m of lasix daily x past 3 days. Denies LE edema. Goes to [facility] daily.   History obtained from review of EMR, discussion with primary team, and interview with family, facility staff/caregiver and/or Ms. Chuck.  I reviewed available labs, medications, imaging, studies and related documents from the EMR.  Records reviewed and summarized above.   ROS  General: NAD EYES: impaired vision ENMT: denies  dysphagia Cardiovascular: denies chest pain Pulmonary: denies cough, SOB with exertion Abdomen: endorses good appetite, denies constipation GU: denies dysuria MSK: weakness Skin: denies rashes or wounds Neurological: denies pain, denies insomnia Psych: Endorses positive mood Heme/lymph/immuno: denies bruises, abnormal bleeding  Physical Exam:  Pulse 54, resp 18, b/p 112/50 sats 95-96% on room air.  Constitutional: NAD General: frail appearing EYES: anicteric sclera, lids intact, no discharge  ENMT: intact hearing, oral mucous membranes moist CV: S1S2, RRR, no LE edema Pulmonary: LCTA, no increased work of breathing, no cough Abdomen: normo-active BS + 4 quadrants, soft and non tender, no ascites GU: deferred EYC:XKGYJ all extremities, ambulatory Skin: warm and dry, no rashes or wounds on visible skin Neuro: generalized weakness, A & O x 3 Psych: non-anxious affect, pleasant Hem/lymph/immuno: no widespread bruising CURRENT PROBLEM LIST:  Patient Active Problem List   Diagnosis Date Noted   Acute on chronic diastolic congestive heart failure (HCC) 05/24/2021   Type II diabetes mellitus with renal manifestations ([place]) 05/24/2021   HLD (hyperlipidemia) 05/24/2021   Chest pain 05/24/2021   Depression 05/24/2021   Legally blind 05/24/2021   Abdominal pain 05/24/2021   Chronic respiratory failure with hypoxia (HCC) 03/16/2021   SOB (shortness of breath) 03/15/2021   Benign hypertensive kidney disease with chronic kidney disease 03/14/2021   Chronic kidney disease, stage IV (severe) ([place]) 03/14/2021   Acute renal failure superimposed on stage 4 chronic kidney disease ([place]) 02/08/2021   Anemia associated with stage 4 chronic renal failure ([place]) 02/08/2021   Anemia due to stage 5 chronic kidney disease ([place]) 02/07/2021   Acute CHF (congestive heart failure) ([place]) 02/06/2021   Acute on chronic diastolic CHF (congestive heart failure) ([place]) 02/06/2021   Hyperkalemia 02/06/2021    Weakness 12/24/2020   Hypertensive urgency 12/23/2020   Nicotine dependence 12/23/2020   History of anemia due to chronic kidney disease 12/23/2020   Pain due to onychomycosis of toenails of both feet 02/12/2020   Uncontrolled type 2 diabetes mellitus with hyperglycemia (HCC) 05/20/2019   Acute recurrent pansinusitis 05/11/2019   Acute otitis media 05/11/2019   Acute pain of left shoulder 05/11/2019   Long term (current) use of anticoagulants 04/03/2019   Chronic left shoulder pain 03/18/2019   Sepsis ([place]) 01/12/2018   Colitis 01/12/2018   CKD (chronic kidney disease), stage III ([place]) 01/12/2018   Diabetes ([place]) 12/28/2017   AF (paroxysmal atrial fibrillation) ([place]) 12/28/2017   Encounter for general adult medical examination with abnormal findings 12/28/2017   Primary generalized (osteo)arthritis 12/28/2017   Hypertension 12/27/2017   GERD (gastroesophageal reflux disease) 12/27/2017   Hematuria 03/18/2015   Chronic combined systolic and diastolic heart failure ([place]) 03/01/2012   Obesity 03/01/2012   CKD stage 4 due to type 2 diabetes mellitus ([place]) 03/01/2012   Other benign neoplasm of connective and other soft tissue of lower limb, including hip 03/15/2011   Neoplasm of connective tissue 02/01/2011   Pain in joint, pelvic region and thigh 02/01/2011   Type 2 diabetes mellitus with stage 5 chronic kidney disease ([place]) 10/03/2006   Encounter for current long-term use of anticoagulants 02/07/2006   Essential hypertension 09/24/2004   Atrial fibrillation ([place]) 06/07/2004  PAST MEDICAL HISTORY:  Active Ambulatory Problems    Diagnosis Date Noted   Hypertension 12/27/2017   GERD (gastroesophageal reflux disease) 12/27/2017   Diabetes ([place]) 12/28/2017   AF (paroxysmal atrial fibrillation) ([place]) 12/28/2017   Encounter for general adult medical examination with abnormal findings 12/28/2017   Primary generalized (osteo)arthritis 12/28/2017   Sepsis ([place]) 01/12/2018   Colitis  01/12/2018   CKD (chronic kidney disease), stage III ([place]) 01/12/2018   Chronic left shoulder pain 03/18/2019   Chronic combined systolic and diastolic heart failure ([place]) 03/01/2012   Hematuria 03/18/2015   Neoplasm of connective tissue 02/01/2011   Obesity 03/01/2012   Other benign neoplasm of connective and other soft tissue of lower limb, including hip 03/15/2011   Pain in joint, pelvic region and thigh 02/01/2011   Atrial fibrillation ([place]) 06/07/2004   CKD stage 4 due to type 2 diabetes mellitus ([place]) 03/01/2012   Type 2 diabetes mellitus with stage 5 chronic kidney disease ([place]) 10/03/2006   Encounter for current long-term use of anticoagulants 02/07/2006   Essential hypertension 09/24/2004   Long term (current) use of anticoagulants 04/03/2019   Acute recurrent pansinusitis 05/11/2019   Acute otitis media 05/11/2019   Acute pain of left shoulder 05/11/2019   Uncontrolled type 2 diabetes mellitus with hyperglycemia ([place]) 05/20/2019   Pain due to onychomycosis of toenails of both feet 02/12/2020   Hypertensive urgency 12/23/2020   Nicotine dependence 12/23/2020   History of anemia due to chronic kidney disease 12/23/2020   Weakness 12/24/2020   Acute CHF (congestive heart failure) ([place]) 02/06/2021   Acute on chronic diastolic CHF (congestive heart failure) ([place]) 02/06/2021   Hyperkalemia 02/06/2021   Anemia due to stage 5 chronic kidney disease ([place]) 02/07/2021   Acute renal failure superimposed on stage 4 chronic kidney disease ([place]) 02/08/2021   Anemia associated with stage 4 chronic renal failure ([place]) 02/08/2021   SOB (shortness of breath) 03/15/2021   Chronic respiratory failure with hypoxia ([place]) 03/16/2021   Acute on chronic diastolic congestive heart failure ([place]) 05/24/2021   Type II diabetes mellitus with renal manifestations ([place]) 05/24/2021   HLD (hyperlipidemia) 05/24/2021   Chest pain 05/24/2021   Depression 05/24/2021   Legally blind 05/24/2021   Abdominal  pain 05/24/2021   Benign hypertensive kidney disease with chronic kidney disease 03/14/2021   Chronic kidney disease, stage IV (severe) ([place]) 03/14/2021   Resolved Ambulatory Problems    Diagnosis Date Noted   No Resolved Ambulatory Problems   Past Medical History:  Diagnosis Date   Anemia    Chronic kidney disease    Congestive heart failure (CHF) ([place])    Diabetes mellitus without complication ([place])    Hyperlipidemia    Pneumonia    Pulmonary fibrosis ([place])    SOCIAL HX:  Social History   Tobacco Use   Smoking status: Former    Packs/day: 1.00    Types: Cigarettes    Quit date: 02/10/2021    Years since quitting: 0.3   Smokeless tobacco: Never  Substance Use Topics   Alcohol use: No   FAMILY HX:  Family History  Problem Relation Age of Onset   Diabetes Mother    Breast cancer Mother    Heart disease Father    Diabetes Father    Diabetes Sister    Diabetes Brother    Cancer Brother       ALLERGIES: No Known Allergies   PERTINENT MEDICATIONS:  Outpatient Encounter Medications as of 06/17/2021  Medication Sig  acetaminophen (TYLENOL) 325 MG tablet Take 650 mg by mouth every 6 (six) hours as needed.   atorvastatin (LIPITOR) 10 MG tablet Take 1 tablet (10 mg total) by mouth every evening.   Blood Pressure Monitor KIT Use three times daily to check blood pressure  DX:  I50.42   citalopram (CELEXA) 40 MG tablet Take 1 tablet by mouth once daily   donepezil (ARICEPT) 5 MG tablet TAKE 1 TABLET BY MOUTH ONCE DAILY FOR MEMORY   ferrous sulfate 325 (65 FE) MG tablet Take 1 tablet (325 mg total) by mouth daily with breakfast.   furosemide (LASIX) 20 MG tablet Take 1 tablet (20 mg total) by mouth daily. Take one tab on MW/F and take 2 for the rest of the week   linaclotide (LINZESS) 145 MCG CAPS capsule Take 1 capsule (145 mcg total) by mouth daily before breakfast.   melatonin 3 MG TABS tablet Take 3 mg by mouth at bedtime as needed (sleep).   Metoprolol Tartrate 75 MG  TABS Take one tab po bid for atrial fib   pantoprazole (PROTONIX) 40 MG tablet Take 1 tablet by mouth twice daily   rOPINIRole (REQUIP) 0.5 MG tablet Take 1 tablet by mouth in the evening   vitamin B-12 1000 MCG tablet Take 1 tablet (1,000 mcg total) by mouth daily.   warfarin (COUMADIN) 2 MG tablet Take 1 tablet (2 mg total) by mouth daily. Except on wed and sat take 4 mg   No facility-administered encounter medications on file as of 06/17/2021.   Thank you for the opportunity to participate in the care of Ms. Schubach.  The palliative care team will continue to follow. Please call our office at [phone] if we can be of additional assistance.   Ezekiel Slocumb, NP   COVID-19 PATIENT SCREENING TOOL Asked and negative response unless otherwise noted:  Have you had symptoms of covid, tested positive or been in contact with someone with symptoms/positive test in the past 5-10 days? No

## 2021-06-17 NOTE — Telephone Encounter (Signed)
Spoke with Dr. Clayborn Bigness, dose will not change due to pt risk of a-fib

## 2021-06-17 NOTE — Telephone Encounter (Signed)
Spoke to Dr. Clayborn Bigness, she said to keep the current dose of 75MG  due to pt risk of a-fib

## 2021-06-20 ENCOUNTER — Ambulatory Visit: Payer: Medicare HMO | Admitting: Nurse Practitioner

## 2021-06-21 ENCOUNTER — Ambulatory Visit (INDEPENDENT_AMBULATORY_CARE_PROVIDER_SITE_OTHER): Payer: Medicare HMO

## 2021-06-21 ENCOUNTER — Telehealth: Payer: Self-pay

## 2021-06-21 DIAGNOSIS — Z7901 Long term (current) use of anticoagulants: Secondary | ICD-10-CM | POA: Diagnosis not present

## 2021-06-21 LAB — PROTIME-INR

## 2021-06-21 NOTE — Progress Notes (Signed)
Pt INR 3.8 as per dr Humphrey Rolls Skip today and go back to take coumadin 2 mg take 1 tab po daily and recheck in 1 week

## 2021-06-21 NOTE — Telephone Encounter (Signed)
[name] to call us back due her Inr result and change for medication dosing skip today and  go back coumadin 2 mg daily

## 2021-06-24 DIAGNOSIS — D631 Anemia in chronic kidney disease: Secondary | ICD-10-CM | POA: Diagnosis not present

## 2021-06-24 DIAGNOSIS — M1990 Unspecified osteoarthritis, unspecified site: Secondary | ICD-10-CM | POA: Diagnosis not present

## 2021-06-24 DIAGNOSIS — E1122 Type 2 diabetes mellitus with diabetic chronic kidney disease: Secondary | ICD-10-CM | POA: Diagnosis not present

## 2021-06-24 DIAGNOSIS — E785 Hyperlipidemia, unspecified: Secondary | ICD-10-CM | POA: Diagnosis not present

## 2021-06-24 DIAGNOSIS — I130 Hypertensive heart and chronic kidney disease with heart failure and stage 1 through stage 4 chronic kidney disease, or unspecified chronic kidney disease: Secondary | ICD-10-CM | POA: Diagnosis not present

## 2021-06-24 DIAGNOSIS — K219 Gastro-esophageal reflux disease without esophagitis: Secondary | ICD-10-CM | POA: Diagnosis not present

## 2021-06-24 DIAGNOSIS — I5043 Acute on chronic combined systolic (congestive) and diastolic (congestive) heart failure: Secondary | ICD-10-CM | POA: Diagnosis not present

## 2021-06-24 DIAGNOSIS — N185 Chronic kidney disease, stage 5: Secondary | ICD-10-CM | POA: Diagnosis not present

## 2021-06-24 DIAGNOSIS — J9611 Chronic respiratory failure with hypoxia: Secondary | ICD-10-CM | POA: Diagnosis not present

## 2021-06-28 ENCOUNTER — Encounter: Payer: Self-pay | Admitting: Emergency Medicine

## 2021-06-28 ENCOUNTER — Other Ambulatory Visit: Payer: Self-pay

## 2021-06-28 ENCOUNTER — Emergency Department: Payer: Medicare HMO

## 2021-06-28 ENCOUNTER — Inpatient Hospital Stay
Admission: EM | Admit: 2021-06-28 | Discharge: 2021-06-30 | DRG: 291 | Disposition: A | Discharge status: Home Health | Payer: Medicare HMO | Attending: Internal Medicine | Admitting: Internal Medicine

## 2021-06-28 DIAGNOSIS — D631 Anemia in chronic kidney disease: Secondary | ICD-10-CM | POA: Diagnosis present

## 2021-06-28 DIAGNOSIS — I130 Hypertensive heart and chronic kidney disease with heart failure and stage 1 through stage 4 chronic kidney disease, or unspecified chronic kidney disease: Principal | ICD-10-CM | POA: Diagnosis present

## 2021-06-28 DIAGNOSIS — I10 Essential (primary) hypertension: Secondary | ICD-10-CM | POA: Diagnosis present

## 2021-06-28 DIAGNOSIS — Z8249 Family history of ischemic heart disease and other diseases of the circulatory system: Secondary | ICD-10-CM | POA: Diagnosis not present

## 2021-06-28 DIAGNOSIS — E611 Iron deficiency: Secondary | ICD-10-CM | POA: Diagnosis present

## 2021-06-28 DIAGNOSIS — Z905 Acquired absence of kidney: Secondary | ICD-10-CM

## 2021-06-28 DIAGNOSIS — Z90710 Acquired absence of both cervix and uterus: Secondary | ICD-10-CM

## 2021-06-28 DIAGNOSIS — R791 Abnormal coagulation profile: Secondary | ICD-10-CM | POA: Diagnosis present

## 2021-06-28 DIAGNOSIS — N184 Chronic kidney disease, stage 4 (severe): Secondary | ICD-10-CM | POA: Diagnosis present

## 2021-06-28 DIAGNOSIS — Z66 Do not resuscitate: Secondary | ICD-10-CM | POA: Diagnosis present

## 2021-06-28 DIAGNOSIS — E1122 Type 2 diabetes mellitus with diabetic chronic kidney disease: Secondary | ICD-10-CM | POA: Diagnosis not present

## 2021-06-28 DIAGNOSIS — Z79899 Other long term (current) drug therapy: Secondary | ICD-10-CM

## 2021-06-28 DIAGNOSIS — I517 Cardiomegaly: Secondary | ICD-10-CM | POA: Diagnosis not present

## 2021-06-28 DIAGNOSIS — E785 Hyperlipidemia, unspecified: Secondary | ICD-10-CM | POA: Diagnosis present

## 2021-06-28 DIAGNOSIS — Z7901 Long term (current) use of anticoagulants: Secondary | ICD-10-CM | POA: Diagnosis not present

## 2021-06-28 DIAGNOSIS — K219 Gastro-esophageal reflux disease without esophagitis: Secondary | ICD-10-CM | POA: Diagnosis present

## 2021-06-28 DIAGNOSIS — E538 Deficiency of other specified B group vitamins: Secondary | ICD-10-CM | POA: Diagnosis present

## 2021-06-28 DIAGNOSIS — Z96651 Presence of right artificial knee joint: Secondary | ICD-10-CM | POA: Diagnosis present

## 2021-06-28 DIAGNOSIS — R001 Bradycardia, unspecified: Secondary | ICD-10-CM | POA: Diagnosis present

## 2021-06-28 DIAGNOSIS — Z79890 Hormone replacement therapy (postmenopausal): Secondary | ICD-10-CM

## 2021-06-28 DIAGNOSIS — Z23 Encounter for immunization: Secondary | ICD-10-CM

## 2021-06-28 DIAGNOSIS — J8410 Pulmonary fibrosis, unspecified: Secondary | ICD-10-CM | POA: Diagnosis present

## 2021-06-28 DIAGNOSIS — I480 Paroxysmal atrial fibrillation: Secondary | ICD-10-CM | POA: Diagnosis not present

## 2021-06-28 DIAGNOSIS — I5033 Acute on chronic diastolic (congestive) heart failure: Secondary | ICD-10-CM | POA: Diagnosis not present

## 2021-06-28 DIAGNOSIS — N189 Chronic kidney disease, unspecified: Secondary | ICD-10-CM | POA: Diagnosis not present

## 2021-06-28 DIAGNOSIS — F32A Depression, unspecified: Secondary | ICD-10-CM | POA: Diagnosis present

## 2021-06-28 DIAGNOSIS — Z803 Family history of malignant neoplasm of breast: Secondary | ICD-10-CM

## 2021-06-28 DIAGNOSIS — R0602 Shortness of breath: Secondary | ICD-10-CM | POA: Diagnosis not present

## 2021-06-28 DIAGNOSIS — I509 Heart failure, unspecified: Secondary | ICD-10-CM | POA: Diagnosis not present

## 2021-06-28 DIAGNOSIS — I5023 Acute on chronic systolic (congestive) heart failure: Secondary | ICD-10-CM | POA: Diagnosis not present

## 2021-06-28 DIAGNOSIS — Z20822 Contact with and (suspected) exposure to covid-19: Secondary | ICD-10-CM | POA: Diagnosis present

## 2021-06-28 DIAGNOSIS — Z833 Family history of diabetes mellitus: Secondary | ICD-10-CM | POA: Diagnosis not present

## 2021-06-28 DIAGNOSIS — E119 Type 2 diabetes mellitus without complications: Secondary | ICD-10-CM

## 2021-06-28 DIAGNOSIS — Z87891 Personal history of nicotine dependence: Secondary | ICD-10-CM

## 2021-06-28 DIAGNOSIS — Z9049 Acquired absence of other specified parts of digestive tract: Secondary | ICD-10-CM

## 2021-06-28 LAB — CBC WITH DIFFERENTIAL/PLATELET
Abs Immature Granulocytes: 0.08 10*3/uL — ABNORMAL HIGH (ref 0.00–0.07)
Basophils Absolute: 0.1 10*3/uL (ref 0.0–0.1)
Basophils Relative: 1 %
Eosinophils Absolute: 0.1 10*3/uL (ref 0.0–0.5)
Eosinophils Relative: 2 %
HCT: 28.4 % — ABNORMAL LOW (ref 36.0–46.0)
Hemoglobin: 9.3 g/dL — ABNORMAL LOW (ref 12.0–15.0)
Immature Granulocytes: 1 %
Lymphocytes Relative: 36 %
Lymphs Abs: 2.9 10*3/uL (ref 0.7–4.0)
MCH: 32.3 pg (ref 26.0–34.0)
MCHC: 32.7 g/dL (ref 30.0–36.0)
MCV: 98.6 fL (ref 80.0–100.0)
Monocytes Absolute: 0.6 10*3/uL (ref 0.1–1.0)
Monocytes Relative: 8 %
Neutro Abs: 4.2 10*3/uL (ref 1.7–7.7)
Neutrophils Relative %: 52 %
Platelets: 236 10*3/uL (ref 150–400)
RBC: 2.88 MIL/uL — ABNORMAL LOW (ref 3.87–5.11)
RDW: 15.1 % (ref 11.5–15.5)
WBC: 7.9 10*3/uL (ref 4.0–10.5)
nRBC: 0 % (ref 0.0–0.2)

## 2021-06-28 LAB — CBG MONITORING, ED
Glucose-Capillary: 118 mg/dL — ABNORMAL HIGH (ref 70–99)
Glucose-Capillary: 151 mg/dL — ABNORMAL HIGH (ref 70–99)
Glucose-Capillary: 147 mg/dL — ABNORMAL HIGH (ref 70–99)

## 2021-06-28 LAB — COMPREHENSIVE METABOLIC PANEL
ALT: 13 U/L (ref 0–44)
AST: 20 U/L (ref 15–41)
Albumin: 3.1 g/dL — ABNORMAL LOW (ref 3.5–5.0)
Alkaline Phosphatase: 50 U/L (ref 38–126)
Anion gap: 9 (ref 5–15)
BUN: 34 mg/dL — ABNORMAL HIGH (ref 8–23)
CO2: 29 mmol/L (ref 22–32)
Calcium: 8.5 mg/dL — ABNORMAL LOW (ref 8.9–10.3)
Chloride: 101 mmol/L (ref 98–111)
Creatinine, Ser: 2.16 mg/dL — ABNORMAL HIGH (ref 0.44–1.00)
GFR, Estimated: 23 mL/min — ABNORMAL LOW (ref >60)
Glucose, Bld: 118 mg/dL — ABNORMAL HIGH (ref 70–99)
Potassium: 3.9 mmol/L (ref 3.5–5.1)
Sodium: 139 mmol/L (ref 135–145)
Total Bilirubin: 0.5 mg/dL (ref 0.3–1.2)
Total Protein: 6.4 g/dL — ABNORMAL LOW (ref 6.5–8.1)

## 2021-06-28 LAB — TROPONIN I (HIGH SENSITIVITY)
Troponin I (High Sensitivity): 10 ng/L (ref <18)
Troponin I (High Sensitivity): 8 ng/L (ref <18)

## 2021-06-28 LAB — RESP PANEL BY RT-PCR (FLU A&B, COVID) ARPGX2
Influenza A by PCR: NEGATIVE
Influenza B by PCR: NEGATIVE
SARS Coronavirus 2 by RT PCR: NEGATIVE

## 2021-06-28 LAB — BRAIN NATRIURETIC PEPTIDE: B Natriuretic Peptide: 1326.5 pg/mL — ABNORMAL HIGH (ref 0.0–100.0)

## 2021-06-28 LAB — MAGNESIUM: Magnesium: 2.2 mg/dL (ref 1.7–2.4)

## 2021-06-28 LAB — PROTIME-INR
INR: 1.5 — ABNORMAL HIGH (ref 0.8–1.2)
Prothrombin Time: 18.2 seconds — ABNORMAL HIGH (ref 11.4–15.2)

## 2021-06-28 LAB — GLUCOSE, CAPILLARY: Glucose-Capillary: 129 mg/dL — ABNORMAL HIGH (ref 70–99)

## 2021-06-28 LAB — TSH: TSH: 10.692 u[IU]/mL — ABNORMAL HIGH (ref 0.350–4.500)

## 2021-06-28 LAB — T4, FREE: Free T4: 1.12 ng/dL (ref 0.61–1.12)

## 2021-06-28 MED ORDER — FUROSEMIDE 10 MG/ML IJ SOLN
40.0000 mg | Freq: Two times a day (BID) | INTRAMUSCULAR | Status: DC
  Administered 2021-06-28: 40 mg via INTRAVENOUS
  Filled 2021-06-28: qty 4

## 2021-06-28 MED ORDER — INFLUENZA VAC A&B SA ADJ QUAD 0.5 ML IM PRSY
0.5000 mL | PREFILLED_SYRINGE | INTRAMUSCULAR | Status: AC
  Administered 2021-06-29: 0.5 mL via INTRAMUSCULAR
  Filled 2021-06-28: qty 0.5

## 2021-06-28 MED ORDER — BUMETANIDE 0.25 MG/ML IJ SOLN
1.0000 mg | Freq: Once | INTRAMUSCULAR | Status: AC
  Administered 2021-06-28: 1 mg via INTRAVENOUS
  Filled 2021-06-28: qty 4

## 2021-06-28 MED ORDER — DONEPEZIL HCL 5 MG PO TABS: 5.0000 mg | ORAL_TABLET | Freq: Every day | ORAL | Status: DC

## 2021-06-28 MED ORDER — CITALOPRAM HYDROBROMIDE 20 MG PO TABS
20.0000 mg | ORAL_TABLET | Freq: Every day | ORAL | Status: DC
  Administered 2021-06-29 – 2021-06-30 (×2): 20 mg via ORAL
  Filled 2021-06-28 (×2): qty 1

## 2021-06-28 MED ORDER — WARFARIN - PHARMACIST DOSING INPATIENT
Freq: Every day | Status: DC
  Filled 2021-06-28: qty 1

## 2021-06-28 MED ORDER — HEPARIN SODIUM (PORCINE) 5000 UNIT/ML IJ SOLN
5000.0000 [IU] | Freq: Three times a day (TID) | INTRAMUSCULAR | Status: DC
  Administered 2021-06-28: 5000 [IU] via SUBCUTANEOUS
  Filled 2021-06-28: qty 1

## 2021-06-28 MED ORDER — CITALOPRAM HYDROBROMIDE 20 MG PO TABS
40.0000 mg | ORAL_TABLET | Freq: Every day | ORAL | Status: DC
  Administered 2021-06-28: 40 mg via ORAL
  Filled 2021-06-28: qty 2

## 2021-06-28 MED ORDER — ROPINIROLE HCL 1 MG PO TABS
0.5000 mg | ORAL_TABLET | Freq: Every evening | ORAL | Status: DC
  Administered 2021-06-28 – 2021-06-29 (×2): 0.5 mg via ORAL
  Filled 2021-06-28: qty 2
  Filled 2021-06-28 (×2): qty 1

## 2021-06-28 MED ORDER — FUROSEMIDE 10 MG/ML IJ SOLN
40.0000 mg | Freq: Two times a day (BID) | INTRAMUSCULAR | Status: AC
  Administered 2021-06-28 – 2021-06-29 (×3): 40 mg via INTRAVENOUS
  Filled 2021-06-28 (×3): qty 4

## 2021-06-28 MED ORDER — HYDRALAZINE HCL 20 MG/ML IJ SOLN
10.0000 mg | Freq: Four times a day (QID) | INTRAMUSCULAR | Status: DC | PRN
  Administered 2021-06-28 – 2021-06-29 (×2): 10 mg via INTRAVENOUS
  Filled 2021-06-28 (×2): qty 1

## 2021-06-28 MED ORDER — WARFARIN SODIUM 2 MG PO TABS: 2.0000 mg | ORAL_TABLET | Freq: Every day | ORAL | Status: DC

## 2021-06-28 MED ORDER — INSULIN ASPART 100 UNIT/ML IJ SOLN
0.0000 [IU] | Freq: Three times a day (TID) | INTRAMUSCULAR | Status: DC
  Administered 2021-06-28: 1 [IU] via SUBCUTANEOUS
  Administered 2021-06-28: 2 [IU] via SUBCUTANEOUS
  Filled 2021-06-28 (×2): qty 1

## 2021-06-28 MED ORDER — MELATONIN 5 MG PO TABS: 2.5000 mg | ORAL_TABLET | Freq: Every evening | ORAL | Status: DC | PRN

## 2021-06-28 MED ORDER — ATORVASTATIN CALCIUM 10 MG PO TABS
10.0000 mg | ORAL_TABLET | Freq: Every evening | ORAL | Status: DC
  Administered 2021-06-28 – 2021-06-29 (×2): 10 mg via ORAL
  Filled 2021-06-28 (×2): qty 1

## 2021-06-28 MED ORDER — WARFARIN SODIUM 4 MG PO TABS
4.0000 mg | ORAL_TABLET | Freq: Once | ORAL | Status: AC
  Administered 2021-06-28: 4 mg via ORAL
  Filled 2021-06-28: qty 1

## 2021-06-28 MED ORDER — SODIUM CHLORIDE 0.9% FLUSH
3.0000 mL | Freq: Two times a day (BID) | INTRAVENOUS | Status: DC
  Administered 2021-06-28 – 2021-06-30 (×6): 3 mL via INTRAVENOUS

## 2021-06-28 MED ORDER — PANTOPRAZOLE SODIUM 40 MG PO TBEC
40.0000 mg | DELAYED_RELEASE_TABLET | Freq: Two times a day (BID) | ORAL | Status: DC
  Administered 2021-06-28 – 2021-06-30 (×5): 40 mg via ORAL
  Filled 2021-06-28 (×5): qty 1

## 2021-06-28 MED ORDER — METOPROLOL TARTRATE 25 MG PO TABS: 25.0000 mg | ORAL_TABLET | Freq: Two times a day (BID) | ORAL | Status: DC

## 2021-06-28 NOTE — ED Triage Notes (Signed)
Pt presents via pov with c/o increasing shortness of breath. Pt states shortness of breath became worse today. Pt states "I have fluid on my lungs". Pt currently on pallative care per daughter.

## 2021-06-28 NOTE — H&P (Signed)
History and Physical    [name] EXB:[id number] DOB: November 22, 1940 DOA: 06/28/2021  PCP: Lavera Guise, MD    Patient coming from:  Home    Chief Complaint:  SOB   HPI: [name] is a 80 y.o. female seen in ed with complaints of sob since Friday after PT. Pt has not felt like going her therapy as she is tired.on Saturday she got 40 mg of lasix.   Pt has [place] at home. Pt was admitted in August 2 nd for similar presentation with CHF presentation.  Daughter has been instructed to give 40 mg of Lasix if weight gain is over a few pounds of what the patient's daily weight is per report.  Patient denies chest pain palpitations EKG today shows rate controlled A. fib.  Patient is currently taking Coumadin with a subtherapeutic INR for her atrial fibrillation.  Her is taking care of patient and patient denies any fevers chills headaches blurred vision ringing in the ears, weakness gait or speech difficulty or swallowing difficulty, abdominal pain bladder or bowel complaints nausea vomiting flank pain fevers or chills.pt has been gaining weight it has gone up to 123 to 130.  Pt has past medical history of diastolic congestive heart failure with a EF of 60 to 65%, hypertension, diabetes, paroxysmal atrial fibrillation, essential hypertension, GERD, hematuria, CKD stage III, atrial fibrillation.  ED Course:  Vitals:   06/28/21 0042 06/28/21 0053 06/28/21 0114 06/28/21 0200  BP: (!) 164/70 (!) 176/49 (!) 169/39 (!) 133/43  Pulse: (!) 48 (!) 29 (!) 56 (!) 55  Resp: (!) 22 (!) 27 (!) 28 (!) 25  Temp: 98.7 F (37.1 C) 98.6 F (37 C)    TempSrc: Oral Oral    SpO2: 92% 96% 100% 100%  In the emergency room patient is alert awake oriented hypertensive bradycardic respiratory rate of 1222 oxygenating 92% on room air.  CMP shows glucose of 118 creatinine of 2.16 which is mildly higher than 1.99, albumin of 3.1, BNP of 1326.5, troponin of 10., CBC shows hemoglobin of 9.3, Respiratory panel  negative for COVID and influenza,   Review of Systems:  Review of Systems  Constitutional: Negative.   HENT: Negative.    Eyes: Negative.   Respiratory:  Positive for shortness of breath.   Cardiovascular:  Negative for chest pain and palpitations.  Gastrointestinal: Negative.   Genitourinary: Negative.   Skin: Negative.   Neurological:  Negative for dizziness, seizures and weakness.    Past Medical History:  Diagnosis Date   Anemia    Atrial fibrillation (HCC)    Chronic kidney disease    Congestive heart failure (CHF) (HCC)    Diabetes mellitus without complication (HCC)    GERD (gastroesophageal reflux disease)    Hyperlipidemia    Hypertension    Pneumonia    Pulmonary fibrosis ([place])     Past Surgical History:  Procedure Laterality Date   ABDOMINAL HYSTERECTOMY     APPENDECTOMY     CATARACT EXTRACTION     CHOLECYSTECTOMY     HERNIA REPAIR     right knee replacement     right nephroectomy       reports that she quit smoking about 4 months ago. Her smoking use included cigarettes. She smoked an average of 1 pack per day. She has never used smokeless tobacco. She reports that she does not drink alcohol and does not use drugs.  No Known Allergies  Family History  Problem Relation Age of  Onset   Diabetes Mother    Breast cancer Mother    Heart disease Father    Diabetes Father    Diabetes Sister    Diabetes Brother    Cancer Brother     Prior to Admission medications   Medication Sig Start Date End Date Taking? Authorizing Provider  acetaminophen (TYLENOL) 325 MG tablet Take 650 mg by mouth every 6 (six) hours as needed.    [provider]  atorvastatin (LIPITOR) 10 MG tablet Take 1 tablet (10 mg total) by mouth every evening. 12/06/20   McDonough, Si Gaul, PA-C  Blood Pressure Monitor KIT Use three times daily to check blood pressure  DX:  I50.42 01/13/21   Lavera Guise, MD  citalopram (CELEXA) 40 MG tablet Take 1 tablet by mouth once daily  05/07/21   Lavera Guise, MD  donepezil (ARICEPT) 5 MG tablet TAKE 1 TABLET BY MOUTH ONCE DAILY FOR MEMORY 12/06/20   McDonough, Si Gaul, PA-C  ferrous sulfate 325 (65 FE) MG tablet Take 1 tablet (325 mg total) by mouth daily with breakfast. 12/30/20   Val Riles, MD  furosemide (LASIX) 20 MG tablet Take 1 tablet (20 mg total) by mouth daily. Take one tab on MW/F and take 2 for the rest of the week 05/31/21   Lavera Guise, MD  linaclotide [facility]) 145 MCG CAPS capsule Take 1 capsule (145 mcg total) by mouth daily before breakfast. 04/19/21   Lavera Guise, MD  melatonin 3 MG TABS tablet Take 3 mg by mouth at bedtime as needed (sleep).    [provider]  Metoprolol Tartrate 75 MG TABS Take one tab po bid for atrial fib 06/06/21   Lavera Guise, MD  pantoprazole (PROTONIX) 40 MG tablet Take 1 tablet by mouth twice daily 05/31/21   Lavera Guise, MD  rOPINIRole (REQUIP) 0.5 MG tablet Take 1 tablet by mouth in the evening 05/07/21   Lavera Guise, MD  vitamin B-12 1000 MCG tablet Take 1 tablet (1,000 mcg total) by mouth daily. 12/30/20   Val Riles, MD  warfarin (COUMADIN) 2 MG tablet Take 1 tablet (2 mg total) by mouth daily. Except on wed and sat take 4 mg 05/04/21   Lavera Guise, MD    Physical Exam: Vitals:   06/28/21 0042 06/28/21 0053 06/28/21 0114 06/28/21 0200  BP: (!) 164/70 (!) 176/49 (!) 169/39 (!) 133/43  Pulse: (!) 48 (!) 29 (!) 56 (!) 55  Resp: (!) 22 (!) 27 (!) 28 (!) 25  Temp: 98.7 F (37.1 C) 98.6 F (37 C)    TempSrc: Oral Oral    SpO2: 92% 96% 100% 100%   Physical Exam Vitals and nursing note reviewed.  Constitutional:      General: She is not in acute distress.    Appearance: She is well-developed. She is not ill-appearing.  HENT:     Head: Normocephalic and atraumatic.     Right Ear: External ear normal.     Left Ear: External ear normal.  Eyes:     Extraocular Movements: Extraocular movements intact.     Pupils: Pupils are equal, round, and reactive to  light.  Cardiovascular:     Rate and Rhythm: Normal rate. Rhythm irregular.     Heart sounds: Murmur heard.  Pulmonary:     Effort: Pulmonary effort is normal.     Breath sounds: Rales present.  Abdominal:     General: Bowel sounds are normal. There is  no distension.     Palpations: Abdomen is soft. There is no mass.     Tenderness: There is no abdominal tenderness.     Hernia: No hernia is present.  Musculoskeletal:        General: No swelling.     Cervical back: Normal range of motion.  Neurological:     Mental Status: She is alert and oriented to person, place, and time. Mental status is at baseline.  Psychiatric:        Mood and Affect: Mood normal.        Behavior: Behavior normal.     Labs on Admission: I have personally reviewed following labs and imaging studies  No results for input(s): CKTOTAL, CKMB, TROPONINI in the last 72 hours. Lab Results  Component Value Date   WBC 7.9 06/28/2021   HGB 9.3 (L) 06/28/2021   HCT 28.4 (L) 06/28/2021   MCV 98.6 06/28/2021   PLT 236 06/28/2021    Recent Labs  Lab 06/28/21 0050  NA 139  K 3.9  CL 101  CO2 29  BUN 34*  CREATININE 2.16*  CALCIUM 8.5*  PROT 6.4*  BILITOT 0.5  ALKPHOS 50  ALT 13  AST 20  GLUCOSE 118*   Lab Results  Component Value Date   CHOL 125 05/25/2021   HDL 75 05/25/2021   LDLCALC 35 05/25/2021   TRIG 73 05/25/2021    No results for input(s): DDIMER, FERRITIN, LDH, CRP in the last 72 hours.  Lab Results  Component Value Date   SARSCOV2NAA NEGATIVE 06/28/2021   [place] NEGATIVE 05/24/2021   [place] NEGATIVE 03/15/2021   [place] NEGATIVE 02/06/2021    Radiological Exams on Admission: DG Chest Port 1 View  Result Date: 06/28/2021 CLINICAL DATA:  Shortness of breath EXAM: PORTABLE CHEST 1 VIEW COMPARISON:  05/24/2021 FINDINGS: Cardiomegaly. Diffuse bilateral interstitial and airspace opacities, likely edema. No visible effusions. Aortic atherosclerosis. No acute bony  abnormality. IMPRESSION: Cardiomegaly, mild CHF. Electronically Signed   By: Rolm Baptise M.D.   On: 06/28/2021 01:03    EKG: Independently reviewed.  Atrial fibrillation at 99 with no ST-T wave changes.  Echocardiogram March 2022: EF of 60 to 65%.   Assessment/Plan Principal Problem:   SOB (shortness of breath) Active Problems:   Acute on chronic diastolic CHF (congestive heart failure) (HCC)   Hypertension   GERD (gastroesophageal reflux disease)   Diabetes (HCC)   AF (paroxysmal atrial fibrillation) (HCC)   Essential hypertension   Acute on chronic diastolic congestive heart failure: Will admit patient to progressive unit-cardiac with continuous telemetry monitoring. Patient's echocardiogram this year showed an EF of 65%. In the emergency room patient received Bumex. Daily weights, strict I's and O's, aspiration and fall precaution.   GERD: IV PPI therapy.  Atrial fibrillation: Rate controlled we will continue Coumadin with pharmacy consult.   Acute on chronic kidney disease: Lab Results  Component Value Date   CREATININE 2.16 (H) 06/28/2021   CREATININE 1.99 (H) 06/06/2021   CREATININE 2.98 (H) 05/30/2021  Will avoid contrast studies and renally dose all needed medications. Strict I's and O's, daily weights.   Essential hypertension: Blood pressure (!) 133/43, pulse (!) 55, temperature 98.6 F (37 C), temperature source Oral, resp. rate (!) 25, SpO2 100 %. Blood pressure is well controlled we will follow vitals. We will continue patient's hydralazine.  Anemia of chronic disease: Will follow CBC and continue patient's iron supplementation.    Diabetes mellitus type 2: Last A1c was 6.6 and  will continue with sliding scale insulin.   Depression: Continue patient on Celexa.   DVT prophylaxis:  Coumadin  Code Status:  DNR  Family Communication:  Rod Can (Daughter)  [phone] (Mobile)   Disposition Plan:  To be determined  Consults  called:  None  Admission status: Inpatient   Para Skeans MD [facility] [phone] How to contact the [facility] Attending or Consulting provider [place] or covering provider during after hours [place], for this patient.    Check the care team in [facility] and look for a) attending/consulting [facility] provider listed and b) the [facility] team listed Log into [email or web address] and use [place]'s universal password to access. If you do not have the password, please contact the hospital operator. Locate the [facility] provider you are looking for under [facility] and page to a number that you can be directly reached. If you still have difficulty reaching the provider, please page the [facility] (Director on Call) for the Hospitalists listed on amion for assistance. [email or web address] Password [email or web address] 06/28/2021, 3:41 AM

## 2021-06-28 NOTE — ED Notes (Signed)
This tech assisted pt w/ bedpad. Linen changed and peri care performed. New brief applied. Female purewick placed. No other needs found at this moment.

## 2021-06-28 NOTE — ED Notes (Signed)
Maggie RN aware of assigned bed 

## 2021-06-28 NOTE — ED Provider Notes (Addendum)
[facility] Emergency Department Provider Note   ____________________________________________   Event Date/Time   First MD Initiated Contact with Patient 06/28/21 [phone]     (approximate)  I have reviewed the triage vital signs and the nursing notes.   HISTORY  Chief Complaint Shortness of Breath  History obtained via patient and her daughter  HPI [name] is a 80 y.o. female brought to the ED from home by her daughter with a chief complaint of increasing shortness of breath.  Patient with a history of CHF, atrial fibrillation on Coumadin, CKD, diabetes, hypertension, pulmonary fibrosis who reports increasing shortness of breath x1 day.  Patient has oxygen at home to use as needed.  Daughter has noted increased weight gain and has been needing to give patient 40 mg of Lasix as opposed to 20 mg of Lasix this last week.  Denies fever, cough, chest pain, abdominal pain, nausea or vomiting     Past Medical History:  Diagnosis Date   Anemia    Atrial fibrillation (HCC)    Chronic kidney disease    Congestive heart failure (CHF) (HCC)    Diabetes mellitus without complication (HCC)    GERD (gastroesophageal reflux disease)    Hyperlipidemia    Hypertension    Pneumonia    Pulmonary fibrosis ([place])     Patient Active Problem List   Diagnosis Date Noted   Acute on chronic diastolic congestive heart failure ([place]) 05/24/2021   Type II diabetes mellitus with renal manifestations ([place]) 05/24/2021   HLD (hyperlipidemia) 05/24/2021   Chest pain 05/24/2021   Depression 05/24/2021   Legally blind 05/24/2021   Abdominal pain 05/24/2021   Chronic respiratory failure with hypoxia ([place]) 03/16/2021   SOB (shortness of breath) 03/15/2021   Benign hypertensive kidney disease with chronic kidney disease 03/14/2021   Chronic kidney disease, stage IV (severe) ([place]) 03/14/2021   Acute renal failure superimposed on stage 4 chronic kidney disease ([place]) 02/08/2021    Anemia associated with stage 4 chronic renal failure ([place]) 02/08/2021   Anemia due to stage 5 chronic kidney disease ([place]) 02/07/2021   Acute CHF (congestive heart failure) ([place]) 02/06/2021   Acute on chronic diastolic CHF (congestive heart failure) ([place]) 02/06/2021   Hyperkalemia 02/06/2021   Weakness 12/24/2020   Hypertensive urgency 12/23/2020   Nicotine dependence 12/23/2020   History of anemia due to chronic kidney disease 12/23/2020   Pain due to onychomycosis of toenails of both feet 02/12/2020   Uncontrolled type 2 diabetes mellitus with hyperglycemia ([place]) 05/20/2019   Acute recurrent pansinusitis 05/11/2019   Acute otitis media 05/11/2019   Acute pain of left shoulder 05/11/2019   Long term (current) use of anticoagulants 04/03/2019   Chronic left shoulder pain 03/18/2019   Sepsis ([place]) 01/12/2018   Colitis 01/12/2018   CKD (chronic kidney disease), stage III ([place]) 01/12/2018   Diabetes ([place]) 12/28/2017   AF (paroxysmal atrial fibrillation) ([place]) 12/28/2017   Encounter for general adult medical examination with abnormal findings 12/28/2017   Primary generalized (osteo)arthritis 12/28/2017   Hypertension 12/27/2017   GERD (gastroesophageal reflux disease) 12/27/2017   Hematuria 03/18/2015   Chronic combined systolic and diastolic heart failure ([place]) 03/01/2012   Obesity 03/01/2012   CKD stage 4 due to type 2 diabetes mellitus ([place]) 03/01/2012   Other benign neoplasm of connective and other soft tissue of lower limb, including hip 03/15/2011   Neoplasm of connective tissue 02/01/2011   Pain in joint, pelvic region and thigh 02/01/2011  Type 2 diabetes mellitus with stage 5 chronic kidney disease ([place]) 10/03/2006   Encounter for current long-term use of anticoagulants 02/07/2006   Essential hypertension 09/24/2004   Atrial fibrillation ([place]) 06/07/2004    Past Surgical History:  Procedure Laterality Date   ABDOMINAL HYSTERECTOMY     APPENDECTOMY     CATARACT  EXTRACTION     CHOLECYSTECTOMY     HERNIA REPAIR     right knee replacement     right nephroectomy      Prior to Admission medications   Medication Sig Start Date End Date Taking? Authorizing Provider  acetaminophen (TYLENOL) 325 MG tablet Take 650 mg by mouth every 6 (six) hours as needed.    [provider]  atorvastatin (LIPITOR) 10 MG tablet Take 1 tablet (10 mg total) by mouth every evening. 12/06/20   McDonough, Si Gaul, PA-C  Blood Pressure Monitor KIT Use three times daily to check blood pressure  DX:  I50.42 01/13/21   Lavera Guise, MD  citalopram (CELEXA) 40 MG tablet Take 1 tablet by mouth once daily 05/07/21   Lavera Guise, MD  donepezil (ARICEPT) 5 MG tablet TAKE 1 TABLET BY MOUTH ONCE DAILY FOR MEMORY 12/06/20   McDonough, Si Gaul, PA-C  ferrous sulfate 325 (65 FE) MG tablet Take 1 tablet (325 mg total) by mouth daily with breakfast. 12/30/20   Val Riles, MD  furosemide (LASIX) 20 MG tablet Take 1 tablet (20 mg total) by mouth daily. Take one tab on MW/F and take 2 for the rest of the week 05/31/21   Lavera Guise, MD  linaclotide [facility]) 145 MCG CAPS capsule Take 1 capsule (145 mcg total) by mouth daily before breakfast. 04/19/21   Lavera Guise, MD  melatonin 3 MG TABS tablet Take 3 mg by mouth at bedtime as needed (sleep).    [provider]  Metoprolol Tartrate 75 MG TABS Take one tab po bid for atrial fib 06/06/21   Lavera Guise, MD  pantoprazole (PROTONIX) 40 MG tablet Take 1 tablet by mouth twice daily 05/31/21   Lavera Guise, MD  rOPINIRole (REQUIP) 0.5 MG tablet Take 1 tablet by mouth in the evening 05/07/21   Lavera Guise, MD  vitamin B-12 1000 MCG tablet Take 1 tablet (1,000 mcg total) by mouth daily. 12/30/20   Val Riles, MD  warfarin (COUMADIN) 2 MG tablet Take 1 tablet (2 mg total) by mouth daily. Except on wed and sat take 4 mg 05/04/21   Lavera Guise, MD    Allergies Patient has no known allergies.  Family History  Problem Relation Age  of Onset   Diabetes Mother    Breast cancer Mother    Heart disease Father    Diabetes Father    Diabetes Sister    Diabetes Brother    Cancer Brother     Social History Social History   Tobacco Use   Smoking status: Former    Packs/day: 1.00    Types: Cigarettes    Quit date: 02/10/2021    Years since quitting: 0.3   Smokeless tobacco: Never  Vaping Use   Vaping Use: Never used  Substance Use Topics   Alcohol use: No   Drug use: No    Review of Systems  Constitutional: No fever/chills Eyes: No visual changes. ENT: No sore throat. Cardiovascular: Denies chest pain. Respiratory: Positive for shortness of breath. Gastrointestinal: No abdominal pain.  No nausea, no vomiting.  No diarrhea.  No constipation. Genitourinary: Negative for dysuria. Musculoskeletal: Negative for back pain. Skin: Negative for rash. Neurological: Negative for headaches, focal weakness or numbness.   ____________________________________________   PHYSICAL EXAM:  VITAL SIGNS: ED Triage Vitals  Enc Vitals Group     BP 06/28/21 0042 (!) 164/70     Pulse Rate 06/28/21 0042 (!) 48     Resp 06/28/21 0042 (!) 22     Temp 06/28/21 0042 98.7 F (37.1 C)     Temp Source 06/28/21 0042 Oral     SpO2 06/28/21 0042 92 %     Weight --      Height --      Head Circumference --      Peak Flow --      Pain Score 06/28/21 0043 0     Pain Loc --      Pain Edu? --      Excl. in [place]? --     Constitutional: Alert and oriented.  Elderly appearing and in mild acute distress. Eyes: Conjunctivae are normal. PERRL. EOMI. Head: Atraumatic. Nose: No congestion/rhinnorhea. Mouth/Throat: Mucous membranes are mildly dry. Neck: No stridor.   Cardiovascular: Bradycardic rate, irregular rhythm. Grossly normal heart sounds.  Good peripheral circulation. Respiratory: Increased respiratory effort.  No retractions. Lungs with audible wheezing and bibasilar rales. Gastrointestinal: Soft and nontender to light or  deep palpation. No distention. No abdominal bruits. No CVA tenderness. Musculoskeletal: No lower extremity tenderness nor edema.  No joint effusions. Neurologic:  Normal speech and language. No gross focal neurologic deficits are appreciated.  Skin:  Skin is warm, dry and intact. No rash noted. Psychiatric: Mood and affect are normal. Speech and behavior are normal.  ____________________________________________   LABS (all labs ordered are listed, but only abnormal results are displayed)  Labs Reviewed  CBC WITH DIFFERENTIAL/PLATELET - Abnormal; Notable for the following components:      Result Value   RBC 2.88 (*)    Hemoglobin 9.3 (*)    HCT 28.4 (*)    Abs Immature Granulocytes 0.08 (*)    All other components within normal limits  COMPREHENSIVE METABOLIC PANEL - Abnormal; Notable for the following components:   Glucose, Bld 118 (*)    BUN 34 (*)    Creatinine, Ser 2.16 (*)    Calcium 8.5 (*)    Total Protein 6.4 (*)    Albumin 3.1 (*)    GFR, Estimated 23 (*)    All other components within normal limits  BRAIN NATRIURETIC PEPTIDE - Abnormal; Notable for the following components:   B Natriuretic Peptide 1,326.5 (*)    All other components within normal limits  PROTIME-INR - Abnormal; Notable for the following components:   Prothrombin Time 18.2 (*)    INR 1.5 (*)    All other components within normal limits  RESP PANEL BY RT-PCR (FLU A&B, COVID) ARPGX2  TROPONIN I (HIGH SENSITIVITY)  TROPONIN I (HIGH SENSITIVITY)   ____________________________________________  EKG  ED ECG REPORT I, Janelli Welling J, the attending physician, personally viewed and interpreted this ECG.   Date: 06/28/2021  EKG Time: 0052  Rate: 53  Rhythm: atrial fibrillation, rate 53  Axis: Normal  Intervals:none  ST&T Change: Nonspecific  ____________________________________________  RADIOLOGY I, Shawana Knoch J, personally viewed and evaluated these images (plain radiographs) as part of my medical  decision making, as well as reviewing the written report by the radiologist.  ED MD interpretation: CHF  Official radiology report(s): DG Chest Port 1 View  Result Date: 06/28/2021  CLINICAL DATA:  Shortness of breath EXAM: PORTABLE CHEST 1 VIEW COMPARISON:  05/24/2021 FINDINGS: Cardiomegaly. Diffuse bilateral interstitial and airspace opacities, likely edema. No visible effusions. Aortic atherosclerosis. No acute bony abnormality. IMPRESSION: Cardiomegaly, mild CHF. Electronically Signed   By: Rolm Baptise M.D.   On: 06/28/2021 01:03    ____________________________________________   PROCEDURES  Procedure(s) performed (including Critical Care):  .1-3 Lead EKG Interpretation  Date/Time: 06/28/2021 1:01 AM Performed by: Paulette Blanch, MD Authorized by: Paulette Blanch, MD     Interpretation: abnormal     ECG rate:  53   ECG rate assessment: bradycardic     Rhythm: atrial fibrillation     Ectopy: none     Conduction: normal   Comments:     Patient placed on cardiac monitor to evaluate for arrhythmias   ____________________________________________   INITIAL IMPRESSION / ASSESSMENT AND PLAN / ED COURSE  As part of my medical decision making, I reviewed the following data within the electronic MEDICAL RECORD NUMBER History obtained from family, Nursing notes reviewed and incorporated, Labs reviewed, EKG interpreted, Old chart reviewed, Radiograph reviewed, and Notes from prior ED visits     80 year old female presenting with shortness of breath. Differential includes, but is not limited to, viral syndrome, bronchitis including COPD exacerbation, pneumonia, reactive airway disease including asthma, CHF including exacerbation with or without pulmonary/interstitial edema, pneumothorax, ACS, thoracic trauma, and pulmonary embolism.   Will obtain cardiac panel, chest x-ray, COVID swab.  Patient placed on 2 L O2 nasal cannula for comfort.  Clinical Course as of 06/28/21 0324  Tue Jun 28, 2021   0304 Updated patient and daughter of all test results.  Discussed case with hospitalist services for admission. [JS]    Clinical Course User Index [JS] Paulette Blanch, MD     ____________________________________________   FINAL CLINICAL IMPRESSION(S) / ED DIAGNOSES  Final diagnoses:  Shortness of breath  Acute on chronic congestive heart failure, unspecified heart failure type ([place])  Chronic kidney disease, unspecified CKD stage     ED Discharge Orders     None        Note:  This document was prepared using Dragon voice recognition software and may include unintentional dictation errors.    Paulette Blanch, MD 06/28/21 9842    Paulette Blanch, MD 06/28/21 [phone]

## 2021-06-28 NOTE — ED Notes (Signed)
Adjusted pt Purewick and put diaper around pt since she had urinated on underwear.

## 2021-06-28 NOTE — Progress Notes (Signed)
PROGRESS NOTE    [name]  URK:[id number] DOB: Oct 30, 1940 DOA: 06/28/2021 PCP: Lavera Guise, MD  [id number]   Assessment & Plan:   Principal Problem:   SOB (shortness of breath) Active Problems:   Hypertension   GERD (gastroesophageal reflux disease)   Diabetes (HCC)   AF (paroxysmal atrial fibrillation) (HCC)   Essential hypertension   Acute on chronic diastolic CHF (congestive heart failure) (HCC)   [name] is a 80 y.o. female with past medical history of diastolic congestive heart failure, hypertension, diabetes, paroxysmal atrial fibrillation, essential hypertension, CKD stage 4, atrial fibrillation who presented with complaints of dyspnea.    Acute on chronic dCHF exacerbation  --presented with dyspnea, CXR "Cardiomegaly. Diffuse bilateral interstitial and airspace opacities, likely edema", BNP 1326. --No documented hypoxia. --started on IV lasix Plan: --cont IV lasix 40 mg BID --Strict I/O --no need for Echo since already had one recently on 05/26/21.  GERD: --cont PPI   Atrial fibrillation on warfarin Bradycardia --HR has been low, home lopressor has been held Plan: --hold lopressor for now due to bradycardia --d/c home donepezil (per pharm, this can increase vagal tone) --cont warfarin, per pharm   AKI, ruled out CKD 4 --Cr 2.16 on presentation, which is better than recent values.  HTN --Hold home Lopressor due to bradycardia --cont IV lasix    Anemia of chronic kidney disease Hx of iron def and Vit B12 def --resume supplements after discharge   Diabetes mellitus type 2: Last A1c was 6.6  --BG has been within inpatient goal --d/c BG checks and SSI   Depression: --reduce home Celexa to 20 mg daily due to QT prolongation   DVT prophylaxis: GB:TDVVOHYW Code Status: DNR  Family Communication:  Level of care: Progressive Cardiac Dispo:   The patient is from: home Anticipated d/c is to: home Anticipated d/c date is: 1-2  days Patient currently is not medically ready to d/c due to: on IV lasix   Subjective and Interval History:  Pt reported breathing improved a bit.  Urine output starting to increase after IV lasix.  Reported coughing, abdominal swelling.  No LE swelling.   Objective: Vitals:   06/28/21 1400 06/28/21 1430 06/28/21 1527 06/28/21 1600  BP: (!) 155/111 (!) 151/57 (!) 143/83 114/61  Pulse: (!) 45 (!) 44 (!) 42 (!) 51  Resp: (!) 24 (!) 25 20 (!) 22  Temp:      TempSrc:      SpO2: 100% 99% 100% 100%    Intake/Output Summary (Last 24 hours) at 06/28/2021 1713 Last data filed at 06/28/2021 1443 Gross per 24 hour  Intake --  Output 950 ml  Net -950 ml   There were no vitals filed for this visit.  Examination:   Constitutional: NAD, AAOx3 HEENT: conjunctivae and lids normal, EOMI CV: No cyanosis.   RESP: normal respiratory effort, clear, on 2L sating 100% Extremities: No effusions, edema in BLE SKIN: warm, dry Neuro: II - XII grossly intact.   Psych: subdued mood and affect.     Data Reviewed: I have personally reviewed following labs and imaging studies  CBC: Recent Labs  Lab 06/28/21 0050  WBC 7.9  NEUTROABS 4.2  HGB 9.3*  HCT 28.4*  MCV 98.6  PLT 737   Basic Metabolic Panel: Recent Labs  Lab 06/28/21 0050  NA 139  K 3.9  CL 101  CO2 29  GLUCOSE 118*  BUN 34*  CREATININE 2.16*  CALCIUM 8.5*  MG 2.2  GFR: Estimated Creatinine Clearance: 15.8 mL/min (A) (by C-G formula based on SCr of 2.16 mg/dL (H)). Liver Function Tests: Recent Labs  Lab 06/28/21 0050  AST 20  ALT 13  ALKPHOS 50  BILITOT 0.5  PROT 6.4*  ALBUMIN 3.1*   No results for input(s): LIPASE, AMYLASE in the last 168 hours. No results for input(s): AMMONIA in the last 168 hours. Coagulation Profile: Recent Labs  Lab 06/28/21 0050  INR 1.5*   Cardiac Enzymes: No results for input(s): CKTOTAL, CKMB, CKMBINDEX, TROPONINI in the last 168 hours. BNP (last 3 results) No results for  input(s): PROBNP in the last 8760 hours. HbA1C: No results for input(s): HGBA1C in the last 72 hours. CBG: Recent Labs  Lab 06/28/21 0835 06/28/21 1123  GLUCAP 118* 151*   Lipid Profile: No results for input(s): CHOL, HDL, LDLCALC, TRIG, CHOLHDL, LDLDIRECT in the last 72 hours. Thyroid Function Tests: Recent Labs    06/28/21 0050  TSH 10.692*  FREET4 1.12   Anemia Panel: No results for input(s): VITAMINB12, FOLATE, FERRITIN, TIBC, IRON, RETICCTPCT in the last 72 hours. Sepsis Labs: No results for input(s): PROCALCITON, LATICACIDVEN in the last 168 hours.  Recent Results (from the past 240 hour(s))  Resp Panel by RT-PCR (Flu A&B, Covid) Nasopharyngeal Swab     Status: None   Collection Time: 06/28/21 12:50 AM   Specimen: Nasopharyngeal Swab; Nasopharyngeal(NP) swabs in vial transport medium  Result Value Ref Range Status   SARS Coronavirus 2 by RT PCR NEGATIVE NEGATIVE Final    Comment: (NOTE) SARS-CoV-2 target nucleic acids are NOT DETECTED.  The SARS-CoV-2 RNA is generally detectable in upper respiratory specimens during the acute phase of infection. The lowest concentration of SARS-CoV-2 viral copies this assay can detect is 138 copies/mL. A negative result does not preclude SARS-Cov-2 infection and should not be used as the sole basis for treatment or other patient management decisions. A negative result may occur with  improper specimen collection/handling, submission of specimen other than nasopharyngeal swab, presence of viral mutation(s) within the areas targeted by this assay, and inadequate number of viral copies(<138 copies/mL). A negative result must be combined with clinical observations, patient history, and epidemiological information. The expected result is Negative.  Fact Sheet for Patients:  EntrepreneurPulse.com.au  Fact Sheet for Healthcare Providers:  IncredibleEmployment.be  This test is no t yet approved or  cleared by the [place] FDA and  has been authorized for detection and/or diagnosis of SARS-CoV-2 by FDA under an Emergency Use Authorization (EUA). This EUA will remain  in effect (meaning this test can be used) for the duration of the COVID-19 declaration under Section 564(b)(1) of the Act, 21 U.S.C.section 360bbb-3(b)(1), unless the authorization is terminated  or revoked sooner.       Influenza A by PCR NEGATIVE NEGATIVE Final   Influenza B by PCR NEGATIVE NEGATIVE Final    Comment: (NOTE) The Xpert Xpress SARS-CoV-2/FLU/RSV plus assay is intended as an aid in the diagnosis of influenza from Nasopharyngeal swab specimens and should not be used as a sole basis for treatment. Nasal washings and aspirates are unacceptable for Xpert Xpress SARS-CoV-2/FLU/RSV testing.  Fact Sheet for Patients: EntrepreneurPulse.com.au  Fact Sheet for Healthcare Providers: IncredibleEmployment.be  This test is not yet approved or cleared by the [place] FDA and has been authorized for detection and/or diagnosis of SARS-CoV-2 by FDA under an Emergency Use Authorization (EUA). This EUA will remain in effect (meaning this test can be used) for the duration of the  COVID-19 declaration under Section 564(b)(1) of the Act, 21 U.S.C. section 360bbb-3(b)(1), unless the authorization is terminated or revoked.  Performed at [facility], [address] [place], [place] [address]       Radiology Studies: [facility], The Chest Graf 1 View  Result Date: 06/28/2021 CLINICAL DATA:  Shortness of breath EXAM: PORTABLE CHEST 1 VIEW COMPARISON:  05/24/2021 FINDINGS: Cardiomegaly. Diffuse bilateral interstitial and airspace opacities, likely edema. No visible effusions. Aortic atherosclerosis. No acute bony abnormality. IMPRESSION: Cardiomegaly, mild CHF. Electronically Signed   By: Rolm Baptise M.D.   On: 06/28/2021 01:03     Scheduled Meds:  atorvastatin  10 mg Oral  QPM   [START ON 06/29/2021] citalopram  20 mg Oral Daily   furosemide  40 mg Intravenous BID   insulin aspart  0-9 Units Subcutaneous TID WC   metoprolol tartrate  25 mg Oral BID   pantoprazole  40 mg Oral BID   rOPINIRole  0.5 mg Oral QPM   sodium chloride flush  3 mL Intravenous Q12H   warfarin  4 mg Oral ONCE-1600   Warfarin - Pharmacist Dosing Inpatient   Does not apply q1600   Continuous Infusions:   LOS: 0 days   No charge.   Enzo Bi, MD [facility] If 7PM-7AM, please contact night-coverage 06/28/2021, 5:13 PM

## 2021-06-28 NOTE — Consult Note (Signed)
ANTICOAGULATION CONSULT NOTE - Initial Consult  Pharmacy Consult for Warfarin dosing Indication: atrial fibrillation  No Known Allergies  Patient Measurements:   Heparin Dosing Weight: N/A  Vital Signs: Temp: 98.6 F (37 C) (09/06 0053) Temp Source: Oral (09/06 0053) BP: 159/68 (09/06 0610) Pulse Rate: 56 (09/06 0610)  Labs: Recent Labs    06/28/21 0050 06/28/21 0431  HGB 9.3*  --   HCT 28.4*  --   PLT 236  --   LABPROT 18.2*  --   INR 1.5*  --   CREATININE 2.16*  --   TROPONINIHS 10 8    Estimated Creatinine Clearance: 15.8 mL/min (A) (by C-G formula based on SCr of 2.16 mg/dL (H)).   Medical History: Past Medical History:  Diagnosis Date   Anemia    Atrial fibrillation (HCC)    Chronic kidney disease    Congestive heart failure (CHF) (HCC)    Diabetes mellitus without complication (HCC)    GERD (gastroesophageal reflux disease)    Hyperlipidemia    Hypertension    Pneumonia    Pulmonary fibrosis (HCC)     Medications:  Warfarin PTA schedule: 2mg  MTuThFSu, 4mg  QKSKS  Assessment: Pharmacy has been consulted to assist in Warfarin dosing in 80yo patient with history of atrial fibrillation.   9/6: INR 1.5, subtherapeutic   Goal of Therapy:  INR 2-3 Monitor platelets by anticoagulation protocol: Yes   Plan:  --Warfarin 4mg  x 1 dose ordered --recheck INR with AM labs --Daily CBC ordered  Eshaan Titzer A Devaeh Amadi 06/28/2021,7:35 AM

## 2021-06-29 LAB — BASIC METABOLIC PANEL
Anion gap: 9 (ref 5–15)
BUN: 34 mg/dL — ABNORMAL HIGH (ref 8–23)
CO2: 33 mmol/L — ABNORMAL HIGH (ref 22–32)
Calcium: 9.1 mg/dL (ref 8.9–10.3)
Chloride: 97 mmol/L — ABNORMAL LOW (ref 98–111)
Creatinine, Ser: 2.42 mg/dL — ABNORMAL HIGH (ref 0.44–1.00)
GFR, Estimated: 20 mL/min — ABNORMAL LOW (ref >60)
Glucose, Bld: 102 mg/dL — ABNORMAL HIGH (ref 70–99)
Potassium: 3.9 mmol/L (ref 3.5–5.1)
Sodium: 139 mmol/L (ref 135–145)

## 2021-06-29 LAB — CBC
HCT: 27.6 % — ABNORMAL LOW (ref 36.0–46.0)
Hemoglobin: 9.1 g/dL — ABNORMAL LOW (ref 12.0–15.0)
MCH: 32 pg (ref 26.0–34.0)
MCHC: 33 g/dL (ref 30.0–36.0)
MCV: 97.2 fL (ref 80.0–100.0)
Platelets: 220 10*3/uL (ref 150–400)
RBC: 2.84 MIL/uL — ABNORMAL LOW (ref 3.87–5.11)
RDW: 14.8 % (ref 11.5–15.5)
WBC: 7 10*3/uL (ref 4.0–10.5)
nRBC: 0 % (ref 0.0–0.2)

## 2021-06-29 LAB — PROTIME-INR
INR: 1.3 — ABNORMAL HIGH (ref 0.8–1.2)
Prothrombin Time: 16.3 seconds — ABNORMAL HIGH (ref 11.4–15.2)

## 2021-06-29 LAB — MAGNESIUM: Magnesium: 2.3 mg/dL (ref 1.7–2.4)

## 2021-06-29 MED ORDER — TORSEMIDE 20 MG PO TABS
40.0000 mg | ORAL_TABLET | Freq: Every day | ORAL | Status: DC
  Administered 2021-06-30: 40 mg via ORAL
  Filled 2021-06-29: qty 2

## 2021-06-29 MED ORDER — WARFARIN SODIUM 4 MG PO TABS
4.0000 mg | ORAL_TABLET | Freq: Once | ORAL | Status: AC
  Administered 2021-06-29: 4 mg via ORAL
  Filled 2021-06-29: qty 1

## 2021-06-29 NOTE — TOC Initial Note (Addendum)
Transition of Care [facility]) - Initial/Assessment Note    Patient Details  Name: [name] MRN: [id number] Date of Birth: 1941-03-08  Transition of Care [facility]) CM/SW Contact:    Alberteen Sam, LCSW Phone Number: 06/29/2021, 8:43 AM  Clinical Narrative:                  Patient noted to have high readmission risk score.   CSW notes per recent admission, reports she lives at home with her daughter and has a walker and a cane. Confirms her PCP is Dr. Humphrey Rolls and that she has no problems getting her medications or getting to a from appointments.   Patient was previously set up with [place] services through [facility] (previously [facility]). CSW has reached out to [place] with [facility] and confirmed patient is active with them for nursing and PT. Will resume at dc, will need [facility] and PT orders at dc.   Expected Discharge Plan: [place] Barriers to Discharge: Continued Medical Work up   Patient Goals and CMS Choice Patient states their goals for this hospitalization and ongoing recovery are:: to go home CMS Medicare.gov Compare Post Acute Care list provided to:: Patient Choice offered to / list presented to : Patient  Expected Discharge Plan and Services Expected Discharge Plan: [place]       Living arrangements for the past 2 months: Single Family Home                                      Prior Living Arrangements/Services Living arrangements for the past 2 months: Single Family Home                     Activities of Daily Living Home Assistive Devices/Equipment: Cane (specify quad or straight), Walker (specify type) ADL Screening (condition at time of admission) Patient's cognitive ability adequate to safely complete daily activities?: Yes Is the patient deaf or have difficulty hearing?: No Does the patient have difficulty seeing, even when wearing glasses/contacts?: Yes Does the patient have difficulty  concentrating, remembering, or making decisions?: No Patient able to express need for assistance with ADLs?: Yes Does the patient have difficulty dressing or bathing?: No Independently performs ADLs?: Yes (appropriate for developmental age) Does the patient have difficulty walking or climbing stairs?: Yes Weakness of Legs: Both Weakness of Arms/Hands: None  Permission Sought/Granted                  Emotional Assessment Appearance:: Appears stated age     Orientation: : Oriented to Place, Oriented to  Time, Oriented to Situation, Oriented to Self Alcohol / Substance Use: Not Applicable Psych Involvement: No (comment)  Admission diagnosis:  Shortness of breath [R06.02] SOB (shortness of breath) [R06.02] Chronic kidney disease, unspecified CKD stage [N18.9] Acute on chronic congestive heart failure, unspecified heart failure type ([place]) [I50.9] Patient Active Problem List   Diagnosis Date Noted   Acute on chronic diastolic congestive heart failure ([place]) 05/24/2021   Type II diabetes mellitus with renal manifestations ([place]) 05/24/2021   HLD (hyperlipidemia) 05/24/2021   Chest pain 05/24/2021   Depression 05/24/2021   Legally blind 05/24/2021   Abdominal pain 05/24/2021   Chronic respiratory failure with hypoxia ([place]) 03/16/2021   SOB (shortness of breath) 03/15/2021   Benign hypertensive kidney disease with chronic kidney disease 03/14/2021   Chronic kidney  disease, stage IV (severe) ([place]) 03/14/2021   Acute renal failure superimposed on stage 4 chronic kidney disease ([place]) 02/08/2021   Anemia associated with stage 4 chronic renal failure ([place]) 02/08/2021   Anemia due to stage 5 chronic kidney disease ([place]) 02/07/2021   Acute CHF (congestive heart failure) ([place]) 02/06/2021   Acute on chronic diastolic CHF (congestive heart failure) ([place]) 02/06/2021   Hyperkalemia 02/06/2021   Weakness 12/24/2020   Hypertensive urgency 12/23/2020   Nicotine dependence 12/23/2020   History  of anemia due to chronic kidney disease 12/23/2020   Pain due to onychomycosis of toenails of both feet 02/12/2020   Uncontrolled type 2 diabetes mellitus with hyperglycemia ([place]) 05/20/2019   Acute recurrent pansinusitis 05/11/2019   Acute otitis media 05/11/2019   Acute pain of left shoulder 05/11/2019   Long term (current) use of anticoagulants 04/03/2019   Chronic left shoulder pain 03/18/2019   Sepsis ([place]) 01/12/2018   Colitis 01/12/2018   CKD (chronic kidney disease), stage III ([place]) 01/12/2018   Diabetes ([place]) 12/28/2017   AF (paroxysmal atrial fibrillation) ([place]) 12/28/2017   Encounter for general adult medical examination with abnormal findings 12/28/2017   Primary generalized (osteo)arthritis 12/28/2017   Hypertension 12/27/2017   GERD (gastroesophageal reflux disease) 12/27/2017   Hematuria 03/18/2015   Chronic combined systolic and diastolic heart failure ([place]) 03/01/2012   Obesity 03/01/2012   CKD stage 4 due to type 2 diabetes mellitus ([place]) 03/01/2012   Other benign neoplasm of connective and other soft tissue of lower limb, including hip 03/15/2011   Neoplasm of connective tissue 02/01/2011   Pain in joint, pelvic region and thigh 02/01/2011   Type 2 diabetes mellitus with stage 5 chronic kidney disease ([place]) 10/03/2006   Encounter for current long-term use of anticoagulants 02/07/2006   Essential hypertension 09/24/2004   Atrial fibrillation ([place]) 06/07/2004   PCP:  Lavera Guise, MD Pharmacy:   [facility] [address]), [place] [address] [place]) [place] [address] Phone: [phone] Fax: [phone]     Social Determinants of Health (SDOH) Interventions    Readmission Risk Interventions Readmission Risk Prevention Plan 05/27/2021  Transportation Screening Complete  Medication Review ([place]) Complete  PCP or Specialist appointment within 3-5 days of discharge Complete  HRI or Home Care  Consult Complete  SW Recovery Care/Counseling Consult Complete  Palliative Care Screening Complete  Skilled Nursing Facility Complete  Some recent data might be hidden

## 2021-06-29 NOTE — Consult Note (Addendum)
ANTICOAGULATION CONSULT NOTE - Initial Consult  Pharmacy Consult for Warfarin dosing Indication: atrial fibrillation  No Known Allergies  Patient Measurements: Height: 4\' 10"  (147.3 cm) Weight: 58 kg (127 lb 13.9 oz) IBW/kg (Calculated) : 40.9 Heparin Dosing Weight: N/A  Vital Signs: Temp: 98.3 F (36.8 C) (09/07 0830) Temp Source: Oral (09/07 0830) BP: 178/45 (09/07 0830) Pulse Rate: 55 (09/07 0830)  Labs: Recent Labs    06/28/21 0050 06/28/21 0431 06/29/21 0622  HGB 9.3*  --  9.1*  HCT 28.4*  --  27.6*  PLT 236  --  220  LABPROT 18.2*  --  16.3*  INR 1.5*  --  1.3*  CREATININE 2.16*  --  2.42*  TROPONINIHS 10 8  --      Estimated Creatinine Clearance: 14 mL/min (A) (by C-G formula based on SCr of 2.42 mg/dL (H)).   Medical History: Past Medical History:  Diagnosis Date   Anemia    Atrial fibrillation (HCC)    Chronic kidney disease    Congestive heart failure (CHF) (HCC)    Diabetes mellitus without complication (HCC)    GERD (gastroesophageal reflux disease)    Hyperlipidemia    Hypertension    Pneumonia    Pulmonary fibrosis (HCC)     Medications:  Warfarin PTA schedule: 2mg  MTuThFSu, 4mg  EMVVK (~2.5mg  daily)  Assessment: Pharmacy has been consulted to assist in Warfarin dosing in 80yo patient with history of atrial fibrillation.   9/5: INR unk, PTA, 2.5mg  pt reported last dose  9/6: INR 1.5, subtherapeutic, 4mg  warfarin ordered 9/7: INR 1.3, subtherapeutic, 4mg  warfarin ordered   Pt received 4mg  9/06 and INR decreased, INR rather reflects their doses of 2-3d ago as pt reports no interruptions in therapy PTA. Will continue with home regimen for now and adjust as indicated  Goal of Therapy:  INR 2-3 Monitor platelets by anticoagulation protocol: Yes   Plan:  --Warfarin 4mg  x 1 dose ordered --Recheck INR with AM labs --Daily CBC ordered  Narda Rutherford, PharmD Pharmacy Resident  06/29/2021 1:11 PM

## 2021-06-29 NOTE — Progress Notes (Signed)
Mobility Specialist - Progress Note   06/29/21 1100  Mobility  Activity Ambulated in hall;Ambulated to bathroom;Transferred:  Bed to chair  Level of Assistance Standby assist, set-up cues, supervision of patient - no hands on  Assistive Device Front wheel walker  Distance Ambulated (ft) 200 ft  Mobility Ambulated with assistance in hallway;Out of bed for toileting;Out of bed to chair with meals  Mobility Response Tolerated well  Mobility performed by Mobility specialist  $Mobility charge 1 Mobility    Pre-mobility: 52 HR, 99% SpO2 During mobility: 63 HR, 90-94% SpO2 Post-mobility: 50 HR, 98% SpO2   Pt lying in bed utilizing 2L. Reports only using O2 as needed PTA. MinA to exit bed. Ambulated to bathroom for urinal output prior to ambulation in hallway. Supervision with RW. Mild SOB, O2 > 90% throughout activity. Does briefly desat to low of 88% once returned to recliner as pt removes [place]. Needs in reach, alarm set.    Kathee Delton Mobility Specialist 06/29/21, 12:06 PM

## 2021-06-29 NOTE — Plan of Care (Signed)
  Problem: Education: Goal: Knowledge of General Education information will improve Description: Including pain rating scale, medication(s)/side effects and non-pharmacologic comfort measures 06/29/2021 1018 by Cristela Blue, RN Outcome: Progressing 06/29/2021 1018 by Cristela Blue, RN Outcome: Progressing   Problem: Health Behavior/Discharge Planning: Goal: Ability to manage health-related needs will improve 06/29/2021 1018 by Cristela Blue, RN Outcome: Progressing 06/29/2021 1018 by Cristela Blue, RN Outcome: Progressing   Problem: Clinical Measurements: Goal: Ability to maintain clinical measurements within normal limits will improve 06/29/2021 1018 by Cristela Blue, RN Outcome: Progressing 06/29/2021 1018 by Cristela Blue, RN Outcome: Progressing Goal: Will remain free from infection 06/29/2021 1018 by Cristela Blue, RN Outcome: Progressing 06/29/2021 1018 by Cristela Blue, RN Outcome: Progressing Goal: Diagnostic test results will improve 06/29/2021 1018 by Cristela Blue, RN Outcome: Progressing 06/29/2021 1018 by Cristela Blue, RN Outcome: Progressing Goal: Respiratory complications will improve 06/29/2021 1018 by Cristela Blue, RN Outcome: Progressing 06/29/2021 1018 by Cristela Blue, RN Outcome: Progressing Goal: Cardiovascular complication will be avoided 06/29/2021 1018 by Cristela Blue, RN Outcome: Progressing 06/29/2021 1018 by Cristela Blue, RN Outcome: Progressing   Problem: Activity: Goal: Risk for activity intolerance will decrease 06/29/2021 1018 by Cristela Blue, RN Outcome: Progressing 06/29/2021 1018 by Cristela Blue, RN Outcome: Progressing   Problem: Nutrition: Goal: Adequate nutrition will be maintained 06/29/2021 1018 by Cristela Blue, RN Outcome: Progressing 06/29/2021 1018 by Cristela Blue, RN Outcome: Progressing   Problem: Coping: Goal: Level of anxiety will decrease 06/29/2021 1018 by Cristela Blue, RN Outcome: Progressing 06/29/2021 1018 by  Cristela Blue, RN Outcome: Progressing   Problem: Elimination: Goal: Will not experience complications related to bowel motility 06/29/2021 1018 by Cristela Blue, RN Outcome: Progressing 06/29/2021 1018 by Cristela Blue, RN Outcome: Progressing Goal: Will not experience complications related to urinary retention 06/29/2021 1018 by Cristela Blue, RN Outcome: Progressing 06/29/2021 1018 by Cristela Blue, RN Outcome: Progressing   Problem: Pain Managment: Goal: General experience of comfort will improve 06/29/2021 1018 by Cristela Blue, RN Outcome: Progressing 06/29/2021 1018 by Cristela Blue, RN Outcome: Progressing   Problem: Safety: Goal: Ability to remain free from injury will improve 06/29/2021 1018 by Cristela Blue, RN Outcome: Progressing 06/29/2021 1018 by Cristela Blue, RN Outcome: Progressing   Problem: Skin Integrity: Goal: Risk for impaired skin integrity will decrease 06/29/2021 1018 by Cristela Blue, RN Outcome: Progressing 06/29/2021 1018 by Cristela Blue, RN Outcome: Progressing   Problem: Education: Goal: Ability to demonstrate management of disease process will improve 06/29/2021 1018 by Cristela Blue, RN Outcome: Progressing 06/29/2021 1018 by Cristela Blue, RN Outcome: Progressing Goal: Ability to verbalize understanding of medication therapies will improve 06/29/2021 1018 by Cristela Blue, RN Outcome: Progressing 06/29/2021 1018 by Cristela Blue, RN Outcome: Progressing Goal: Individualized Educational Video(s) 06/29/2021 1018 by Cristela Blue, RN Outcome: Progressing 06/29/2021 1018 by Cristela Blue, RN Outcome: Progressing   Problem: Activity: Goal: Capacity to carry out activities will improve 06/29/2021 1018 by Cristela Blue, RN Outcome: Progressing 06/29/2021 1018 by Cristela Blue, RN Outcome: Progressing   Problem: Cardiac: Goal: Ability to achieve and maintain adequate cardiopulmonary perfusion will improve 06/29/2021 1018 by Cristela Blue,  RN Outcome: Progressing 06/29/2021 1018 by Cristela Blue, RN Outcome: Progressing

## 2021-06-29 NOTE — Progress Notes (Signed)
PROGRESS NOTE    [name]  WLS:[id number] DOB: 08-27-1941 DOA: 06/28/2021 PCP: Lavera Guise, MD   Brief Narrative: Taken from prior notes. [name] is a 80 y.o. female with past medical history of diastolic congestive heart failure, hypertension, diabetes, paroxysmal atrial fibrillation, essential hypertension, CKD stage 4, atrial fibrillation who presented with complaints of dyspnea.  BNP elevated at 1326.  Admitted for acute on chronic diastolic heart failure.  Subjective: Patient was seen and examined today.  Seems improving.  Denies any chest pain.  Assessment & Plan:   Principal Problem:   SOB (shortness of breath) Active Problems:   Hypertension   GERD (gastroesophageal reflux disease)   Diabetes (HCC)   AF (paroxysmal atrial fibrillation) (HCC)   Essential hypertension   Acute on chronic diastolic CHF (congestive heart failure) (HCC)  Acute on chronic HFpEF.  Recent echo done in August 2022 with normal EF and diastolic dysfunction.  Patient received IV diuresis with improvement of her symptoms.  No documented hypoxia.  Mild increase in creatinine today. -Switch her to p.o. torsemide from tomorrow -Continue strict intake and output -Continue with daily BMP  Atrial fibrillation/current bradycardia.  Patient was bradycardic on presentation.  Her home dose of Lopressor has been held since then.  Her home dose of donepezil was also discontinued. -Continue with Coumadin per pharmacy. -She needs a close follow-up with her cardiologist for further recommendations.  CKD stage IV.  AKI ruled out.  Creatinine close to her baseline.  Mild increase today with IV diuresis. -Monitor renal function -Avoid nephrotoxins  Hypertension.  Blood pressure mildly elevated. Home dose of Lopressor is being held due to bradycardia. -As needed hydralazine  Anemia of chronic kidney disease/iron and vitamin B12 deficiency. Hemoglobin seems stable. -Continue home  supplements.  Well-controlled diabetes mellitus.  A1c of 6.6. -SSI as needed  Depression. -Continue with home dose of Celexa-dose decreased to 20 mg due to QT prolongation.  Objective: Vitals:   06/29/21 0830 06/29/21 0845 06/29/21 1215 06/29/21 1535  BP: (!) 178/45 (!) 150/63 (!) 157/31 (!) 163/36  Pulse: (!) 55  (!) 54 (!) 54  Resp: 17  19 17   Temp: 98.3 F (36.8 C)  99.3 F (37.4 C) (!) 97.5 F (36.4 C)  TempSrc: Oral  Oral Oral  SpO2: 92%  94% 97%  Weight:      Height:        Intake/Output Summary (Last 24 hours) at 06/29/2021 1712 Last data filed at 06/29/2021 1414 Gross per 24 hour  Intake 1010 ml  Output 1750 ml  Net -740 ml   Filed Weights   06/28/21 1843 06/29/21 0500  Weight: 60.2 kg 58 kg    Examination:  General exam: Appears calm and comfortable  Respiratory system: Few basal crackles. Respiratory effort normal. Cardiovascular system: S1 & S2 heard, RRR.  Gastrointestinal system: Soft, nontender, nondistended, bowel sounds positive. Central nervous system: Alert and oriented. No focal neurological deficits.Symmetric 5 x 5 power. Extremities: No edema, no cyanosis, pulses intact and symmetrical. Psychiatry: Judgement and insight appear normal.   DVT prophylaxis: Coumadin Code Status: DNR Family Communication:  Disposition Plan:  Status is: Inpatient  Remains inpatient appropriate because:Inpatient level of care appropriate due to severity of illness  Dispo: The patient is from: Home              Anticipated d/c is to: Home              Patient currently is not medically stable  to d/c.   Difficult to place patient No               Level of care: Progressive Cardiac  All the records are reviewed and case discussed with Care Management/Social Worker. Management plans discussed with the patient, nursing and they are in agreement.  Consultants:  None  Procedures:  Antimicrobials:   Data Reviewed: I have personally reviewed following labs and  imaging studies  CBC: Recent Labs  Lab 06/28/21 0050 06/29/21 0622  WBC 7.9 7.0  NEUTROABS 4.2  --   HGB 9.3* 9.1*  HCT 28.4* 27.6*  MCV 98.6 97.2  PLT 236 702   Basic Metabolic Panel: Recent Labs  Lab 06/28/21 0050 06/29/21 0622  NA 139 139  K 3.9 3.9  CL 101 97*  CO2 29 33*  GLUCOSE 118* 102*  BUN 34* 34*  CREATININE 2.16* 2.42*  CALCIUM 8.5* 9.1  MG 2.2 2.3   GFR: Estimated Creatinine Clearance: 14 mL/min (A) (by C-G formula based on SCr of 2.42 mg/dL (H)). Liver Function Tests: Recent Labs  Lab 06/28/21 0050  AST 20  ALT 13  ALKPHOS 50  BILITOT 0.5  PROT 6.4*  ALBUMIN 3.1*   No results for input(s): LIPASE, AMYLASE in the last 168 hours. No results for input(s): AMMONIA in the last 168 hours. Coagulation Profile: Recent Labs  Lab 06/28/21 0050 06/29/21 0622  INR 1.5* 1.3*   Cardiac Enzymes: No results for input(s): CKTOTAL, CKMB, CKMBINDEX, TROPONINI in the last 168 hours. BNP (last 3 results) No results for input(s): PROBNP in the last 8760 hours. HbA1C: No results for input(s): HGBA1C in the last 72 hours. CBG: Recent Labs  Lab 06/28/21 0835 06/28/21 1123 06/28/21 1714 06/28/21 1949  GLUCAP 118* 151* 147* 129*   Lipid Profile: No results for input(s): CHOL, HDL, LDLCALC, TRIG, CHOLHDL, LDLDIRECT in the last 72 hours. Thyroid Function Tests: Recent Labs    06/28/21 0050  TSH 10.692*  FREET4 1.12   Anemia Panel: No results for input(s): VITAMINB12, FOLATE, FERRITIN, TIBC, IRON, RETICCTPCT in the last 72 hours. Sepsis Labs: No results for input(s): PROCALCITON, LATICACIDVEN in the last 168 hours.  Recent Results (from the past 240 hour(s))  Resp Panel by RT-PCR (Flu A&B, Covid) Nasopharyngeal Swab     Status: None   Collection Time: 06/28/21 12:50 AM   Specimen: Nasopharyngeal Swab; Nasopharyngeal(NP) swabs in vial transport medium  Result Value Ref Range Status   SARS Coronavirus 2 by RT PCR NEGATIVE NEGATIVE Final    Comment:  (NOTE) SARS-CoV-2 target nucleic acids are NOT DETECTED.  The SARS-CoV-2 RNA is generally detectable in upper respiratory specimens during the acute phase of infection. The lowest concentration of SARS-CoV-2 viral copies this assay can detect is 138 copies/mL. A negative result does not preclude SARS-Cov-2 infection and should not be used as the sole basis for treatment or other patient management decisions. A negative result may occur with  improper specimen collection/handling, submission of specimen other than nasopharyngeal swab, presence of viral mutation(s) within the areas targeted by this assay, and inadequate number of viral copies(<138 copies/mL). A negative result must be combined with clinical observations, patient history, and epidemiological information. The expected result is Negative.  Fact Sheet for Patients:  EntrepreneurPulse.com.au  Fact Sheet for Healthcare Providers:  IncredibleEmployment.be  This test is no t yet approved or cleared by the [place] FDA and  has been authorized for detection and/or diagnosis of SARS-CoV-2 by FDA under an Emergency Use Authorization (  EUA). This EUA will remain  in effect (meaning this test can be used) for the duration of the COVID-19 declaration under Section 564(b)(1) of the Act, 21 U.S.C.section 360bbb-3(b)(1), unless the authorization is terminated  or revoked sooner.       Influenza A by PCR NEGATIVE NEGATIVE Final   Influenza B by PCR NEGATIVE NEGATIVE Final    Comment: (NOTE) The Xpert Xpress SARS-CoV-2/FLU/RSV plus assay is intended as an aid in the diagnosis of influenza from Nasopharyngeal swab specimens and should not be used as a sole basis for treatment. Nasal washings and aspirates are unacceptable for Xpert Xpress SARS-CoV-2/FLU/RSV testing.  Fact Sheet for Patients: EntrepreneurPulse.com.au  Fact Sheet for Healthcare  Providers: IncredibleEmployment.be  This test is not yet approved or cleared by the [place] FDA and has been authorized for detection and/or diagnosis of SARS-CoV-2 by FDA under an Emergency Use Authorization (EUA). This EUA will remain in effect (meaning this test can be used) for the duration of the COVID-19 declaration under Section 564(b)(1) of the Act, 21 U.S.C. section 360bbb-3(b)(1), unless the authorization is terminated or revoked.  Performed at [facility], [address] [place], [place] [address]      Radiology Studies: [facility] Chest [place] 1 View  Result Date: 06/28/2021 CLINICAL DATA:  Shortness of breath EXAM: PORTABLE CHEST 1 VIEW COMPARISON:  05/24/2021 FINDINGS: Cardiomegaly. Diffuse bilateral interstitial and airspace opacities, likely edema. No visible effusions. Aortic atherosclerosis. No acute bony abnormality. IMPRESSION: Cardiomegaly, mild CHF. Electronically Signed   By: Rolm Baptise M.D.   On: 06/28/2021 01:03    Scheduled Meds:  atorvastatin  10 mg Oral QPM   citalopram  20 mg Oral Daily   pantoprazole  40 mg Oral BID   rOPINIRole  0.5 mg Oral QPM   sodium chloride flush  3 mL Intravenous Q12H   [START ON 06/30/2021] torsemide  40 mg Oral Daily   warfarin  4 mg Oral ONCE-1600   Warfarin - Pharmacist Dosing Inpatient   Does not apply q1600   Continuous Infusions:   LOS: 1 day   Time spent: 40 minutes. More than 50% of the time was spent in counseling/coordination of care  Lorella Nimrod, MD [facility]  If 7PM-7AM, please contact night-coverage [email or web address]  06/29/2021, 5:12 PM   This record has been created using Systems analyst. Errors have been sought and corrected,but may not always be located. Such creation errors do not reflect on the standard of care.

## 2021-06-29 NOTE — Progress Notes (Signed)
Mobility Specialist - Progress Note   06/29/21 1600  Mobility  Activity Transferred:  Chair to bed  Level of Assistance Standby assist, set-up cues, supervision of patient - no hands on  Assistive Device Front wheel walker  Distance Ambulated (ft) 4 ft  Mobility Ambulated with assistance in room  Mobility Response Tolerated well  Mobility performed by Mobility specialist  $Mobility charge 1 Mobility    Pt returned to bed and performed gown change. Alarm set.    Kathee Delton Mobility Specialist 06/29/21, 4:24 PM

## 2021-06-29 NOTE — Consult Note (Signed)
   Heart Failure Nurse Navigator Note  HFpEF 55%.  She presented to the emergency room with complaints of increasing shortness of breath.  She had also noted a 7 pound weight gain.  Comorbidities:  Hypertension Hyperlipidemia Diabetes Pulmonary fibrosis Chronic kidney disease stage IV Atrial fibrillation Anemia GERD Depression  Medications:  Lipitor 10 mg Furosemide 40 mg IV 2 times a day Warfarin  Labs:  Sodium 139, potassium 3.9, chloride 97, CO2 33, BUN 34, creatinine 2.24 Intake 530 mL Output 2100 mL   Initial meeting today with patient on this hospitalization.  She states that she continues to eat canned soups at least 3 times a week.  She states that she likes the [place].  Instructed that those contain anywhere from 1400 mg to 1800 mg of sodium.  Total daily allotment is 2000 mg. discussed buying low-sodium soup, gave her some written examples, the soups contain between 70 and 140 mg of sodium per serving.  She also eats saltine crackers, recommended buying salt free.  She states that she also eats at the [facility] 5 times a week.  She states that her daughter weighs her every morning.  She was given the living with heart failure teaching booklet along with information on low-sodium.  Pricilla Riffle RN CHFN

## 2021-06-30 LAB — CBC
HCT: 28 % — ABNORMAL LOW (ref 36.0–46.0)
Hemoglobin: 9.4 g/dL — ABNORMAL LOW (ref 12.0–15.0)
MCH: 31.5 pg (ref 26.0–34.0)
MCHC: 33.6 g/dL (ref 30.0–36.0)
MCV: 94 fL (ref 80.0–100.0)
Platelets: 217 10*3/uL (ref 150–400)
RBC: 2.98 MIL/uL — ABNORMAL LOW (ref 3.87–5.11)
RDW: 14.6 % (ref 11.5–15.5)
WBC: 7.7 10*3/uL (ref 4.0–10.5)
nRBC: 0 % (ref 0.0–0.2)

## 2021-06-30 LAB — BASIC METABOLIC PANEL
Anion gap: 10 (ref 5–15)
BUN: 48 mg/dL — ABNORMAL HIGH (ref 8–23)
CO2: 31 mmol/L (ref 22–32)
Calcium: 8.9 mg/dL (ref 8.9–10.3)
Chloride: 95 mmol/L — ABNORMAL LOW (ref 98–111)
Creatinine, Ser: 2.37 mg/dL — ABNORMAL HIGH (ref 0.44–1.00)
GFR, Estimated: 20 mL/min — ABNORMAL LOW (ref >60)
Glucose, Bld: 140 mg/dL — ABNORMAL HIGH (ref 70–99)
Potassium: 3.9 mmol/L (ref 3.5–5.1)
Sodium: 136 mmol/L (ref 135–145)

## 2021-06-30 LAB — MAGNESIUM: Magnesium: 2.2 mg/dL (ref 1.7–2.4)

## 2021-06-30 LAB — GLUCOSE, CAPILLARY: Glucose-Capillary: 166 mg/dL — ABNORMAL HIGH (ref 70–99)

## 2021-06-30 LAB — PROTIME-INR
INR: 1.5 — ABNORMAL HIGH (ref 0.8–1.2)
Prothrombin Time: 18.3 seconds — ABNORMAL HIGH (ref 11.4–15.2)

## 2021-06-30 MED ORDER — AMLODIPINE BESYLATE 5 MG PO TABS
5.0000 mg | ORAL_TABLET | Freq: Every day | ORAL | 1 refills | Status: DC
Start: 2021-07-01 — End: 2021-08-25

## 2021-06-30 MED ORDER — CITALOPRAM HYDROBROMIDE 20 MG PO TABS: 20.0000 mg | ORAL_TABLET | Freq: Every day | ORAL | 1 refills | Status: DC

## 2021-06-30 MED ORDER — AMLODIPINE BESYLATE 5 MG PO TABS
5.0000 mg | ORAL_TABLET | Freq: Every day | ORAL | Status: DC
  Administered 2021-06-30: 5 mg via ORAL
  Filled 2021-06-30: qty 1

## 2021-06-30 MED ORDER — TORSEMIDE 40 MG PO TABS: 40.0000 mg | ORAL_TABLET | Freq: Every day | ORAL | 0 refills | Status: DC

## 2021-06-30 NOTE — Progress Notes (Signed)
This RN provided discharge instructions and teaching to the patient. The patient verbalized and demonstrated understanding of the provided instructions. All outstanding questions resolved. L arm PIV removed. Cannula intact. Pt tolerated well. All belongings packed and in tow. Volunteer services to transport patient via wheelchair to private vehicle at time of departure.

## 2021-06-30 NOTE — Plan of Care (Signed)
Patient slept well, maintained SPO2 >92% on RA, no BM, VS stable.   Problem: Education: Goal: Knowledge of General Education information will improve Description: Including pain rating scale, medication(s)/side effects and non-pharmacologic comfort measures Outcome: Progressing   Problem: Health Behavior/Discharge Planning: Goal: Ability to manage health-related needs will improve Outcome: Progressing   Problem: Clinical Measurements: Goal: Ability to maintain clinical measurements within normal limits will improve Outcome: Progressing Goal: Will remain free from infection Outcome: Progressing Goal: Diagnostic test results will improve Outcome: Progressing Goal: Respiratory complications will improve Outcome: Progressing Goal: Cardiovascular complication will be avoided Outcome: Progressing   Problem: Activity: Goal: Risk for activity intolerance will decrease Outcome: Progressing   Problem: Nutrition: Goal: Adequate nutrition will be maintained Outcome: Progressing   Problem: Coping: Goal: Level of anxiety will decrease Outcome: Progressing   Problem: Elimination: Goal: Will not experience complications related to bowel motility Outcome: Progressing Goal: Will not experience complications related to urinary retention Outcome: Progressing   Problem: Pain Managment: Goal: General experience of comfort will improve Outcome: Progressing   Problem: Safety: Goal: Ability to remain free from injury will improve Outcome: Progressing   Problem: Skin Integrity: Goal: Risk for impaired skin integrity will decrease Outcome: Progressing   Problem: Education: Goal: Ability to demonstrate management of disease process will improve Outcome: Progressing Goal: Ability to verbalize understanding of medication therapies will improve Outcome: Progressing Goal: Individualized Educational Video(s) Outcome: Progressing   Problem: Activity: Goal: Capacity to carry out activities  will improve Outcome: Progressing   Problem: Cardiac: Goal: Ability to achieve and maintain adequate cardiopulmonary perfusion will improve Outcome: Progressing

## 2021-06-30 NOTE — Consult Note (Signed)
ANTICOAGULATION CONSULT NOTE - Initial Consult  Pharmacy Consult for Warfarin dosing Indication: atrial fibrillation  No Known Allergies  Patient Measurements: Height: 4\' 10"  (147.3 cm) Weight: 57.8 kg (127 lb 6.8 oz) IBW/kg (Calculated) : 40.9 Heparin Dosing Weight: N/A  Vital Signs: Temp: 98 F (36.7 C) (09/08 0758) Temp Source: Oral (09/08 0644) BP: 146/49 (09/08 0758) Pulse Rate: 66 (09/08 0758)  Labs: Recent Labs    06/28/21 0050 06/28/21 0431 06/29/21 0622 06/30/21 0612  HGB 9.3*  --  9.1* 9.4*  HCT 28.4*  --  27.6* 28.0*  PLT 236  --  220 217  LABPROT 18.2*  --  16.3* 18.3*  INR 1.5*  --  1.3* 1.5*  CREATININE 2.16*  --  2.42* 2.37*  TROPONINIHS 10 8  --   --      Estimated Creatinine Clearance: 14.3 mL/min (A) (by C-G formula based on SCr of 2.37 mg/dL (H)).   Medical History: Past Medical History:  Diagnosis Date   Anemia    Atrial fibrillation (HCC)    Chronic kidney disease    Congestive heart failure (CHF) (HCC)    Diabetes mellitus without complication (HCC)    GERD (gastroesophageal reflux disease)    Hyperlipidemia    Hypertension    Pneumonia    Pulmonary fibrosis (HCC)     Medications:  Warfarin PTA schedule: 2mg  MTuThFSu, 4mg  OACZY (~2.5mg  daily)  Assessment: Pharmacy has been consulted to assist in Warfarin dosing in 80yo patient with history of atrial fibrillation.   9/5: INR unk, PTA, 2.5mg  pt reported last dose  9/6: INR 1.5, subtherapeutic, 4mg  warfarin ordered 9/7: INR 1.3, subtherapeutic, 4mg  warfarin ordered  9/8: INR 1.5, delta +0.2,   Pt received 4mg  9/06 and INR decreased, INR rather reflects their doses of 2-3d ago as pt reports no interruptions in therapy PTA. Will continue with home regimen for now and adjust as indicated  Goal of Therapy:  INR 2-3 Monitor platelets by anticoagulation protocol: Yes   Plan:  Pt INR 1.3>1.5 (delta +0.2 appropriate).  Pt typically on 2.5mg  today per home regimen; however remains  subtherapeutic after 2d of 4mg  and has resumed full diet 90-100% of last reported meals.  Will repeat Warfarin 4mg  x 1 dose tonight.  Will continue with inc'd dosing today and reassess with AM INR. May be able to resume home dosing soon; appearing euvolemic. Recheck INR with AM labs Daily CBC ordered  Lorna Dibble, PharmD,BCCP Clinical Pharmacist 06/30/2021 9:02 AM

## 2021-06-30 NOTE — Progress Notes (Signed)
Mobility Specialist - Progress Note   06/30/21 1136  Mobility  Activity Transferred:  Bed to chair  Level of Assistance Standby assist, set-up cues, supervision of patient - no hands on  Assistive Device None  Distance Ambulated (ft) 3 ft  Mobility Out of bed to chair with meals  Mobility Response Tolerated well  Mobility performed by Mobility specialist  $Mobility charge 1 Mobility    Pt transferred bed-chair with supervision. Declined further ambulation as pt is anticipating d/c this date, and suggests only light activity. Alarm set.    Kathee Delton Mobility Specialist 06/30/21, 11:41 AM

## 2021-06-30 NOTE — Plan of Care (Signed)
  Problem: Education: Goal: Knowledge of General Education information will improve Description: Including pain rating scale, medication(s)/side effects and non-pharmacologic comfort measures 06/30/2021 1132 by Cristela Blue, RN Outcome: Progressing 06/30/2021 1132 by Cristela Blue, RN Outcome: Progressing   Problem: Health Behavior/Discharge Planning: Goal: Ability to manage health-related needs will improve 06/30/2021 1132 by Cristela Blue, RN Outcome: Progressing 06/30/2021 1132 by Cristela Blue, RN Outcome: Progressing   Problem: Clinical Measurements: Goal: Ability to maintain clinical measurements within normal limits will improve 06/30/2021 1132 by Cristela Blue, RN Outcome: Progressing 06/30/2021 1132 by Cristela Blue, RN Outcome: Progressing Goal: Will remain free from infection 06/30/2021 1132 by Cristela Blue, RN Outcome: Progressing 06/30/2021 1132 by Cristela Blue, RN Outcome: Progressing Goal: Diagnostic test results will improve 06/30/2021 1132 by Cristela Blue, RN Outcome: Progressing 06/30/2021 1132 by Cristela Blue, RN Outcome: Progressing Goal: Respiratory complications will improve 06/30/2021 1132 by Cristela Blue, RN Outcome: Progressing 06/30/2021 1132 by Cristela Blue, RN Outcome: Progressing Goal: Cardiovascular complication will be avoided 06/30/2021 1132 by Cristela Blue, RN Outcome: Progressing 06/30/2021 1132 by Cristela Blue, RN Outcome: Progressing   Problem: Activity: Goal: Risk for activity intolerance will decrease 06/30/2021 1132 by Cristela Blue, RN Outcome: Progressing 06/30/2021 1132 by Cristela Blue, RN Outcome: Progressing   Problem: Nutrition: Goal: Adequate nutrition will be maintained 06/30/2021 1132 by Cristela Blue, RN Outcome: Progressing 06/30/2021 1132 by Cristela Blue, RN Outcome: Progressing   Problem: Coping: Goal: Level of anxiety will decrease 06/30/2021 1132 by Cristela Blue, RN Outcome: Progressing 06/30/2021 1132 by  Cristela Blue, RN Outcome: Progressing   Problem: Elimination: Goal: Will not experience complications related to bowel motility 06/30/2021 1132 by Cristela Blue, RN Outcome: Progressing 06/30/2021 1132 by Cristela Blue, RN Outcome: Progressing Goal: Will not experience complications related to urinary retention 06/30/2021 1132 by Cristela Blue, RN Outcome: Progressing 06/30/2021 1132 by Cristela Blue, RN Outcome: Progressing   Problem: Pain Managment: Goal: General experience of comfort will improve 06/30/2021 1132 by Cristela Blue, RN Outcome: Progressing 06/30/2021 1132 by Cristela Blue, RN Outcome: Progressing   Problem: Safety: Goal: Ability to remain free from injury will improve 06/30/2021 1132 by Cristela Blue, RN Outcome: Progressing 06/30/2021 1132 by Cristela Blue, RN Outcome: Progressing   Problem: Skin Integrity: Goal: Risk for impaired skin integrity will decrease 06/30/2021 1132 by Cristela Blue, RN Outcome: Progressing 06/30/2021 1132 by Cristela Blue, RN Outcome: Progressing   Problem: Education: Goal: Ability to demonstrate management of disease process will improve 06/30/2021 1132 by Cristela Blue, RN Outcome: Progressing 06/30/2021 1132 by Cristela Blue, RN Outcome: Progressing Goal: Ability to verbalize understanding of medication therapies will improve 06/30/2021 1132 by Cristela Blue, RN Outcome: Progressing 06/30/2021 1132 by Cristela Blue, RN Outcome: Progressing Goal: Individualized Educational Video(s) 06/30/2021 1132 by Cristela Blue, RN Outcome: Progressing 06/30/2021 1132 by Cristela Blue, RN Outcome: Progressing   Problem: Activity: Goal: Capacity to carry out activities will improve 06/30/2021 1132 by Cristela Blue, RN Outcome: Progressing 06/30/2021 1132 by Cristela Blue, RN Outcome: Progressing   Problem: Cardiac: Goal: Ability to achieve and maintain adequate cardiopulmonary perfusion will improve 06/30/2021 1132 by Cristela Blue,  RN Outcome: Progressing 06/30/2021 1132 by Cristela Blue, RN Outcome: Progressing

## 2021-06-30 NOTE — TOC Transition Note (Signed)
Transition of Care [facility]) - CM/SW Discharge Note   Patient Details  Name: [name] MRN: [id number] Date of Birth: 1941-02-08  [facility] CM/SW Contact:  Alberteen Sam, LCSW Phone Number: 06/30/2021, 12:34 PM   Clinical Narrative:     Patient will DC to: home with home health Anticipated DC date:06/30/21   Patient to dc home with home health with [facility] and is set up with RN and PT. No other discharge needs identified.  CSW signing off.  Pricilla Riffle, LCSW    Final next level of care: [place] Barriers to Discharge: No Barriers Identified   Patient Goals and CMS Choice Patient states their goals for this hospitalization and ongoing recovery are:: to go home CMS Medicare.gov Compare Post Acute Care list provided to:: Patient Choice offered to / list presented to : Patient  Discharge Placement                    Patient and family notified of of transfer: 06/30/21  Discharge Plan and Services                          HH Arranged: PT, RN [facility] Agency: [place] Date [place]: 06/30/21 Time [place]: 1234 Representative spoke with at [place]: [place]  Social Determinants of Health ([place]) Interventions     Readmission Risk Interventions Readmission Risk Prevention Plan 06/29/2021 05/27/2021  Transportation Screening Complete Complete  Medication Review Press photographer) Complete Complete  PCP or Specialist appointment within 3-5 days of discharge Complete Complete  HRI or Home Care Consult Complete Complete  SW Recovery Care/Counseling Consult Complete Complete  Palliative Care Screening Not Applicable Complete  [place] Not Applicable Complete  Some recent data might be hidden

## 2021-06-30 NOTE — Discharge Summary (Signed)
Physician Discharge Summary  SHARMAN GARROTT EVO:[id number] DOB: [birth year] DOA: 06/28/2021  PCP: Lavera Guise, MD  Admit date: 06/28/2021 Discharge date: 06/30/2021  Admitted From: Home Disposition: Home  Recommendations for Outpatient Follow-up:  Follow up with PCP in 1-2 weeks Please obtain BMP/CBC in one week Please follow up on the following pending results: None  Home Health: Yes Equipment/Devices: Rolling walker Discharge Condition: Stable CODE STATUS: DNR Diet recommendation: Heart Healthy   Brief/Interim Summary: [name] is a 80 y.o. female with past medical history of diastolic congestive heart failure, hypertension, diabetes, paroxysmal atrial fibrillation, essential hypertension, CKD stage 4, atrial fibrillation who presented with complaints of dyspnea.  BNP elevated at 1326.  Admitted for acute on chronic diastolic heart failure.  Recent echo done in August 2022 with normal EF and diastolic dysfunction.  Patient received IV diuresis with improvement of her symptoms.  No documented hypoxia.  After getting a mild increase in creatinine her IV Lasix was transitioned to p.o. torsemide.  Patient has an history of atrial fibrillation but found to be bradycardic on presentation.  Her home dose of Lopressor and donepezil was discontinued.  Heart rate remained stable. She will continue with Coumadin and follow-up with her providers closely for further recommendations.  She has an history of CKD stage IV.  Creatinine remains close to her baseline.  Her blood pressure was mildly elevated after discontinuing Lopressor-she was started on low-dose amlodipine and her primary care provider should be able to titrate as needed.  She will continue with rest of her home medications and follow-up with her providers.  Discharge Diagnoses:  Principal Problem:   SOB (shortness of breath) Active Problems:   Hypertension   GERD (gastroesophageal reflux disease)   Diabetes (HCC)   AF  (paroxysmal atrial fibrillation) (HCC)   Essential hypertension   Acute on chronic diastolic CHF (congestive heart failure) [facility]    Discharge Instructions  Discharge Instructions     (HEART FAILURE PATIENTS) Call MD:  Anytime you have any of the following symptoms: 1) 3 pound weight gain in 24 hours or 5 pounds in 1 week 2) shortness of breath, with or without a dry hacking cough 3) swelling in the hands, feet or stomach 4) if you have to sleep on extra pillows at night in order to breathe.   Complete by: As directed    Diet - low sodium heart healthy   Complete by: As directed    Discharge instructions   Complete by: As directed    It was pleasure taking care of you. We made some changes to your medications, stop taking metoprolol and Lasix and start torsemide with amlodipine. Please follow-up very closely with your primary care provider and cardiologist for better adjustment of your medications and further management.   Increase activity slowly   Complete by: As directed       Allergies as of 06/30/2021   No Known Allergies      Medication List     STOP taking these medications    donepezil 5 MG tablet Commonly known as: ARICEPT   furosemide 20 MG tablet Commonly known as: LASIX   lisinopril 20 MG tablet Commonly known as: ZESTRIL   Metoprolol Tartrate 75 MG Tabs       TAKE these medications    acetaminophen 325 MG tablet Commonly known as: TYLENOL Take 650 mg by mouth every 6 (six) hours as needed.   amLODipine 5 MG tablet Commonly known as: NORVASC Take 1 tablet (  5 mg total) by mouth daily. Start taking on: July 01, 2021   atorvastatin 10 MG tablet Commonly known as: LIPITOR Take 1 tablet (10 mg total) by mouth every evening.   Blood Pressure Monitor Kit Use three times daily to check blood pressure  DX:  I50.42   citalopram 20 MG tablet Commonly known as: CELEXA Take 1 tablet (20 mg total) by mouth daily. What changed:  medication  strength how much to take   cyanocobalamin 1000 MCG tablet Take 1 tablet (1,000 mcg total) by mouth daily.   ferrous sulfate 325 (65 FE) MG tablet Take 1 tablet (325 mg total) by mouth daily with breakfast.   linaclotide 145 MCG Caps capsule Commonly known as: Linzess Take 1 capsule (145 mcg total) by mouth daily before breakfast.   melatonin 3 MG Tabs tablet Take 3 mg by mouth at bedtime as needed (sleep).   pantoprazole 40 MG tablet Commonly known as: PROTONIX Take 1 tablet by mouth twice daily   rOPINIRole 0.5 MG tablet Commonly known as: REQUIP Take 1 tablet by mouth in the evening   Torsemide 40 MG Tabs Take 40 mg by mouth daily.   warfarin 2 MG tablet Commonly known as: COUMADIN Take 1 tablet (2 mg total) by mouth daily. Except on wed and sat take 4 mg        Follow-up Information     Lavera Guise, MD. Schedule an appointment as soon as possible for a visit in 1 week(s).   Specialties: Internal Medicine, Cardiology Contact information: [place] [address] [phone]                No Known Allergies  Consultations: None  Procedures/Studies: DG Chest Port 1 View  Result Date: 06/28/2021 CLINICAL DATA:  Shortness of breath EXAM: PORTABLE CHEST 1 VIEW COMPARISON:  05/24/2021 FINDINGS: Cardiomegaly. Diffuse bilateral interstitial and airspace opacities, likely edema. No visible effusions. Aortic atherosclerosis. No acute bony abnormality. IMPRESSION: Cardiomegaly, mild CHF. Electronically Signed   By: Rolm Baptise M.D.   On: 06/28/2021 01:03    Subjective: Patient was seen and examined today.  She has no new complaints.  Denies any chest pain or shortness of breath.  Feels at baseline.  She lives with her daughter.  Wants to go back home.  Discharge Exam: Vitals:   06/30/21 0644 06/30/21 0758  BP: 119/65 (!) 146/49  Pulse: 60 66  Resp: 18 16  Temp: 98.2 F (36.8 C) 98 F (36.7 C)  SpO2: 93% 94%   Vitals:   06/30/21 0030  06/30/21 0500 06/30/21 0644 06/30/21 0758  BP: (!) 123/53  119/65 (!) 146/49  Pulse: (!) 58  60 66  Resp: _0 Temp: 98.6 F (37 C)  98.2 F (36.8 C) 98 F (36.7 C)  TempSrc:   Oral   SpO2: 93%  93% 94%  Weight:  57.8 kg    Height:        General: Pt is alert, awake, not in acute distress Cardiovascular: RRR, S1/S2 +, no rubs, no gallops Respiratory: CTA bilaterally, no wheezing, no rhonchi Abdominal: Soft, NT, ND, bowel sounds + Extremities: no edema, no cyanosis   The results of significant diagnostics from this hospitalization (including imaging, microbiology, ancillary and laboratory) are listed below for reference.    Microbiology: Recent Results (from the past 240 hour(s))  Resp Panel by RT-PCR (Flu A&B, Covid) Nasopharyngeal Swab     Status: None   Collection Time: 06/28/21  12:50 AM   Specimen: Nasopharyngeal Swab; Nasopharyngeal(NP) swabs in vial transport medium  Result Value Ref Range Status   SARS Coronavirus 2 by RT PCR NEGATIVE NEGATIVE Final    Comment: (NOTE) SARS-CoV-2 target nucleic acids are NOT DETECTED.  The SARS-CoV-2 RNA is generally detectable in upper respiratory specimens during the acute phase of infection. The lowest concentration of SARS-CoV-2 viral copies this assay can detect is 138 copies/mL. A negative result does not preclude SARS-Cov-2 infection and should not be used as the sole basis for treatment or other patient management decisions. A negative result may occur with  improper specimen collection/handling, submission of specimen other than nasopharyngeal swab, presence of viral mutation(s) within the areas targeted by this assay, and inadequate number of viral copies(<138 copies/mL). A negative result must be combined with clinical observations, patient history, and epidemiological information. The expected result is Negative.  Fact Sheet for Patients:  EntrepreneurPulse.com.au  Fact Sheet for Healthcare  Providers:  IncredibleEmployment.be  This test is no t yet approved or cleared by the [place] FDA and  has been authorized for detection and/or diagnosis of SARS-CoV-2 by FDA under an Emergency Use Authorization (EUA). This EUA will remain  in effect (meaning this test can be used) for the duration of the COVID-19 declaration under Section 564(b)(1) of the Act, 21 U.S.C.section 360bbb-3(b)(1), unless the authorization is terminated  or revoked sooner.       Influenza A by PCR NEGATIVE NEGATIVE Final   Influenza B by PCR NEGATIVE NEGATIVE Final    Comment: (NOTE) The Xpert Xpress SARS-CoV-2/FLU/RSV plus assay is intended as an aid in the diagnosis of influenza from Nasopharyngeal swab specimens and should not be used as a sole basis for treatment. Nasal washings and aspirates are unacceptable for Xpert Xpress SARS-CoV-2/FLU/RSV testing.  Fact Sheet for Patients: EntrepreneurPulse.com.au  Fact Sheet for Healthcare Providers: IncredibleEmployment.be  This test is not yet approved or cleared by the [place] FDA and has been authorized for detection and/or diagnosis of SARS-CoV-2 by FDA under an Emergency Use Authorization (EUA). This EUA will remain in effect (meaning this test can be used) for the duration of the COVID-19 declaration under Section 564(b)(1) of the Act, 21 U.S.C. section 360bbb-3(b)(1), unless the authorization is terminated or revoked.  Performed at [facility], [address] [place], [place] [address]      Labs: BNP (last 3 results) Recent Labs    03/15/21 1130 05/24/21 0455 06/28/21 0050  BNP 780.9* 1,334.6* 1,696.7*   Basic Metabolic Panel: Recent Labs  Lab 06/28/21 0050 06/29/21 0622 06/30/21 0612  NA 139 139 136  K 3.9 3.9 3.9  CL 101 97* 95*  CO2 29 33* 31  GLUCOSE 118* 102* 140*  BUN 34* 34* 48*  CREATININE 2.16* 2.42* 2.37*  CALCIUM 8.5* 9.1 8.9  MG 2.2  2.3 2.2   Liver Function Tests: Recent Labs  Lab 06/28/21 0050  AST 20  ALT 13  ALKPHOS 50  BILITOT 0.5  PROT 6.4*  ALBUMIN 3.1*   No results for input(s): LIPASE, AMYLASE in the last 168 hours. No results for input(s): AMMONIA in the last 168 hours. CBC: Recent Labs  Lab 06/28/21 0050 06/29/21 0622 06/30/21 0612  WBC 7.9 7.0 7.7  NEUTROABS 4.2  --   --   HGB 9.3* 9.1* 9.4*  HCT 28.4* 27.6* 28.0*  MCV 98.6 97.2 94.0  PLT 236 220 217   Cardiac Enzymes: No results for input(s): CKTOTAL, CKMB, CKMBINDEX, TROPONINI in the  last 168 hours. BNP: Invalid input(s): POCBNP CBG: Recent Labs  Lab 06/28/21 0835 06/28/21 1123 06/28/21 1714 06/28/21 1949  GLUCAP 118* 151* 147* 129*   D-Dimer No results for input(s): DDIMER in the last 72 hours. Hgb A1c No results for input(s): HGBA1C in the last 72 hours. Lipid Profile No results for input(s): CHOL, HDL, LDLCALC, TRIG, CHOLHDL, LDLDIRECT in the last 72 hours. Thyroid function studies Recent Labs    06/28/21 0050  TSH 10.692*   Anemia work up No results for input(s): VITAMINB12, FOLATE, FERRITIN, TIBC, IRON, RETICCTPCT in the last 72 hours. Urinalysis    Component Value Date/Time   COLORURINE YELLOW (A) 12/23/2020 1040   APPEARANCEUR Clear 03/28/2021 1703   LABSPEC 1.013 12/23/2020 1040   PHURINE 6.0 12/23/2020 1040   GLUCOSEU Negative 03/28/2021 1703   HGBUR MODERATE (A) 12/23/2020 1040   BILIRUBINUR Negative 03/28/2021 1703   KETONESUR NEGATIVE 12/23/2020 1040   PROTEINUR 2+ (A) 03/28/2021 1703   PROTEINUR >=300 (A) 12/23/2020 1040   UROBILINOGEN 0.2 09/02/2019 1441   NITRITE Negative 03/28/2021 1703   NITRITE NEGATIVE 12/23/2020 1040   LEUKOCYTESUR Negative 03/28/2021 1703   LEUKOCYTESUR NEGATIVE 12/23/2020 1040   Sepsis Labs Invalid input(s): PROCALCITONIN,  WBC,  LACTICIDVEN Microbiology Recent Results (from the past 240 hour(s))  Resp Panel by RT-PCR (Flu A&B, Covid) Nasopharyngeal Swab     Status:  None   Collection Time: 06/28/21 12:50 AM   Specimen: Nasopharyngeal Swab; Nasopharyngeal(NP) swabs in vial transport medium  Result Value Ref Range Status   SARS Coronavirus 2 by RT PCR NEGATIVE NEGATIVE Final    Comment: (NOTE) SARS-CoV-2 target nucleic acids are NOT DETECTED.  The SARS-CoV-2 RNA is generally detectable in upper respiratory specimens during the acute phase of infection. The lowest concentration of SARS-CoV-2 viral copies this assay can detect is 138 copies/mL. A negative result does not preclude SARS-Cov-2 infection and should not be used as the sole basis for treatment or other patient management decisions. A negative result may occur with  improper specimen collection/handling, submission of specimen other than nasopharyngeal swab, presence of viral mutation(s) within the areas targeted by this assay, and inadequate number of viral copies(<138 copies/mL). A negative result must be combined with clinical observations, patient history, and epidemiological information. The expected result is Negative.  Fact Sheet for Patients:  EntrepreneurPulse.com.au  Fact Sheet for Healthcare Providers:  IncredibleEmployment.be  This test is no t yet approved or cleared by the [place] FDA and  has been authorized for detection and/or diagnosis of SARS-CoV-2 by FDA under an Emergency Use Authorization (EUA). This EUA will remain  in effect (meaning this test can be used) for the duration of the COVID-19 declaration under Section 564(b)(1) of the Act, 21 U.S.C.section 360bbb-3(b)(1), unless the authorization is terminated  or revoked sooner.       Influenza A by PCR NEGATIVE NEGATIVE Final   Influenza B by PCR NEGATIVE NEGATIVE Final    Comment: (NOTE) The Xpert Xpress SARS-CoV-2/FLU/RSV plus assay is intended as an aid in the diagnosis of influenza from Nasopharyngeal swab specimens and should not be used as a sole basis for  treatment. Nasal washings and aspirates are unacceptable for Xpert Xpress SARS-CoV-2/FLU/RSV testing.  Fact Sheet for Patients: EntrepreneurPulse.com.au  Fact Sheet for Healthcare Providers: IncredibleEmployment.be  This test is not yet approved or cleared by the [place] FDA and has been authorized for detection and/or diagnosis of SARS-CoV-2 by FDA under an Emergency Use Authorization (EUA). This EUA will remain  in effect (meaning this test can be used) for the duration of the COVID-19 declaration under Section 564(b)(1) of the Act, 21 U.S.C. section 360bbb-3(b)(1), unless the authorization is terminated or revoked.  Performed at [facility], [address] [place], [place] [address]     Time coordinating discharge: Over 30 minutes  SIGNED:  Lorella Nimrod, MD  [facility] 06/30/2021, 11:37 AM  If 7PM-7AM, please contact night-coverage [email or web address]  This record has been created using Systems analyst. Errors have been sought and corrected,but may not always be located. Such creation errors do not reflect on the standard of care.

## 2021-07-01 ENCOUNTER — Telehealth: Payer: Self-pay

## 2021-07-01 NOTE — Telephone Encounter (Signed)
Pt daughter called that pt discharge today and she made appt with alyssa next Tuesday advised her bring discharge paper  and we can she her Tuesday for hospital follow up

## 2021-07-02 DIAGNOSIS — J9611 Chronic respiratory failure with hypoxia: Secondary | ICD-10-CM | POA: Diagnosis not present

## 2021-07-02 DIAGNOSIS — I130 Hypertensive heart and chronic kidney disease with heart failure and stage 1 through stage 4 chronic kidney disease, or unspecified chronic kidney disease: Secondary | ICD-10-CM | POA: Diagnosis not present

## 2021-07-02 DIAGNOSIS — E1122 Type 2 diabetes mellitus with diabetic chronic kidney disease: Secondary | ICD-10-CM | POA: Diagnosis not present

## 2021-07-02 DIAGNOSIS — E785 Hyperlipidemia, unspecified: Secondary | ICD-10-CM | POA: Diagnosis not present

## 2021-07-02 DIAGNOSIS — D631 Anemia in chronic kidney disease: Secondary | ICD-10-CM | POA: Diagnosis not present

## 2021-07-02 DIAGNOSIS — N185 Chronic kidney disease, stage 5: Secondary | ICD-10-CM | POA: Diagnosis not present

## 2021-07-02 DIAGNOSIS — I5043 Acute on chronic combined systolic (congestive) and diastolic (congestive) heart failure: Secondary | ICD-10-CM | POA: Diagnosis not present

## 2021-07-02 DIAGNOSIS — M1990 Unspecified osteoarthritis, unspecified site: Secondary | ICD-10-CM | POA: Diagnosis not present

## 2021-07-02 DIAGNOSIS — K219 Gastro-esophageal reflux disease without esophagitis: Secondary | ICD-10-CM | POA: Diagnosis not present

## 2021-07-03 DIAGNOSIS — E785 Hyperlipidemia, unspecified: Secondary | ICD-10-CM | POA: Diagnosis not present

## 2021-07-03 DIAGNOSIS — I5043 Acute on chronic combined systolic (congestive) and diastolic (congestive) heart failure: Secondary | ICD-10-CM | POA: Diagnosis not present

## 2021-07-03 DIAGNOSIS — D631 Anemia in chronic kidney disease: Secondary | ICD-10-CM | POA: Diagnosis not present

## 2021-07-03 DIAGNOSIS — M1990 Unspecified osteoarthritis, unspecified site: Secondary | ICD-10-CM | POA: Diagnosis not present

## 2021-07-03 DIAGNOSIS — I130 Hypertensive heart and chronic kidney disease with heart failure and stage 1 through stage 4 chronic kidney disease, or unspecified chronic kidney disease: Secondary | ICD-10-CM | POA: Diagnosis not present

## 2021-07-03 DIAGNOSIS — E1122 Type 2 diabetes mellitus with diabetic chronic kidney disease: Secondary | ICD-10-CM | POA: Diagnosis not present

## 2021-07-03 DIAGNOSIS — N185 Chronic kidney disease, stage 5: Secondary | ICD-10-CM | POA: Diagnosis not present

## 2021-07-03 DIAGNOSIS — J9611 Chronic respiratory failure with hypoxia: Secondary | ICD-10-CM | POA: Diagnosis not present

## 2021-07-03 DIAGNOSIS — K219 Gastro-esophageal reflux disease without esophagitis: Secondary | ICD-10-CM | POA: Diagnosis not present

## 2021-07-04 ENCOUNTER — Telehealth: Payer: Self-pay

## 2021-07-04 ENCOUNTER — Ambulatory Visit: Payer: Medicare HMO

## 2021-07-04 DIAGNOSIS — R001 Bradycardia, unspecified: Secondary | ICD-10-CM

## 2021-07-04 DIAGNOSIS — I480 Paroxysmal atrial fibrillation: Secondary | ICD-10-CM

## 2021-07-04 NOTE — Telephone Encounter (Signed)
-----   Message from Wellington Hampshire, MD sent at 07/04/2021 10:49 AM EDT ----- [name], Please arrange for this patient to have a 2-week ZIO monitor for bradycardia and paroxysmal atrial fibrillation and follow-up in our office shortly after.Thanks.    ----- Message ----- From: Lavera Guise, MD Sent: 07/04/2021   9:14 AM EDT To: Jonetta Osgood, NP, Marshall Cork, #  Spoke with Dr Fletcher Anon this am regarding this pt She has atrial fibrillation, not tolerating metoprolol, has been admitted multiple times with acute on chronic diastolic heart failure. Was set up with HF clinic but no proper outpt cardiology f/u. Pt might need pacemaker due to significant bradycardia   Thanks   Clayborn Bigness MD

## 2021-07-04 NOTE — Telephone Encounter (Signed)
Scheduled

## 2021-07-04 NOTE — Telephone Encounter (Addendum)
Spoke with the patients daughter June. June made aware of the physicians recommendation below. She is agreement for the patient to wear a 14 days zio XT and to f/u with our office after. June rqst that the monitor be mailed to their home and she will follow the instructions to apply and return it. Advised her that [employer] may call her to verify the mailing address before mailing out the device, if so it is important that she speakwith them. Adv June that the patient will need an appt with [facility] in 5-6 week. Adv June that a scheduler will call her to schedule an appt. June verbalized understanding and voiced appreciation for the call.     to [facility] [employer] Scheduling  Me     1:44 PM Please call this patient to schedule a new patient appt for 5-6 weeks  Can be any available MD.  Dx [name]

## 2021-07-04 NOTE — Telephone Encounter (Signed)
[name] [phone]) from [facility] called requesting verbal orders for PT 1 x week for 5 weeks I gave verbal ok to [facility]

## 2021-07-04 NOTE — Telephone Encounter (Signed)
Gave verbal order to tanya from [facility] [phone] for nursing once a week for 5 week home health order  given from hospital

## 2021-07-05 ENCOUNTER — Other Ambulatory Visit: Payer: Self-pay

## 2021-07-05 ENCOUNTER — Encounter: Payer: Self-pay | Admitting: Nurse Practitioner

## 2021-07-05 ENCOUNTER — Ambulatory Visit (INDEPENDENT_AMBULATORY_CARE_PROVIDER_SITE_OTHER): Payer: Medicare HMO | Admitting: Nurse Practitioner

## 2021-07-05 VITALS — BP 114/60 | HR 98 | Temp 98.3°F | Resp 16 | Ht <= 58 in | Wt 123.2 lb

## 2021-07-05 DIAGNOSIS — I4821 Permanent atrial fibrillation: Secondary | ICD-10-CM | POA: Diagnosis not present

## 2021-07-05 DIAGNOSIS — N1832 Chronic kidney disease, stage 3b: Secondary | ICD-10-CM | POA: Diagnosis not present

## 2021-07-05 DIAGNOSIS — I10 Essential (primary) hypertension: Secondary | ICD-10-CM | POA: Diagnosis not present

## 2021-07-05 DIAGNOSIS — Z09 Encounter for follow-up examination after completed treatment for conditions other than malignant neoplasm: Secondary | ICD-10-CM

## 2021-07-05 DIAGNOSIS — D631 Anemia in chronic kidney disease: Secondary | ICD-10-CM

## 2021-07-05 DIAGNOSIS — I5033 Acute on chronic diastolic (congestive) heart failure: Secondary | ICD-10-CM

## 2021-07-05 DIAGNOSIS — G3184 Mild cognitive impairment, so stated: Secondary | ICD-10-CM

## 2021-07-05 DIAGNOSIS — N184 Chronic kidney disease, stage 4 (severe): Secondary | ICD-10-CM

## 2021-07-05 NOTE — Progress Notes (Signed)
[employer] [name] [place] [id number] [facility] Discharge Acute Issues Care Follow Up                                                                        Patient Demographics  [name], is a 80 y.o. female  DOB July 26, 1941  MRN [id number].  Primary MD  Amy Guise, MD   Reason for TCC follow Up - Recent hospitalization for SOB   Past Medical History:  Diagnosis Date   Anemia    Atrial fibrillation ([place])    Chronic kidney disease    Congestive heart failure (CHF) (HCC)    Diabetes mellitus without complication (HCC)    GERD (gastroesophageal reflux disease)    Hyperlipidemia    Hypertension    Pneumonia    Pulmonary fibrosis ([place])     Past Surgical History:  Procedure Laterality Date   ABDOMINAL HYSTERECTOMY     APPENDECTOMY     CATARACT EXTRACTION     CHOLECYSTECTOMY     HERNIA REPAIR     right knee replacement     right nephroectomy         Recent HPI and Hospital Course  [name] is a 80 y.o. female with past medical history of diastolic congestive heart failure, hypertension, diabetes, paroxysmal atrial fibrillation, essential hypertension, CKD stage 4, atrial fibrillation who presented with complaints of dyspnea.  BNP elevated at 1326.  Admitted for acute on chronic diastolic heart failure.  Recent echo done in August 2022 with normal EF and diastolic dysfunction.  Patient received IV diuresis with improvement of her symptoms.  No documented hypoxia.  After getting a mild increase in creatinine her IV Lasix was transitioned to p.o. torsemide.   Patient has an history of atrial fibrillation but found to be bradycardic on presentation.  Her home dose of Lopressor and donepezil was discontinued.  Heart rate remained stable. She will continue with Coumadin and follow-up with her providers closely for further  recommendations.  She has an history of CKD stage IV.  Creatinine remains close to her baseline.   Her blood pressure was mildly elevated after discontinuing Lopressor-she was started on low-dose amlodipine and her primary care provider should be able to titrate as needed.   She will continue with rest of her home medications and follow-up with her providers.  [facility] Acute Care Issue to be followed in the Clinic   Active Problems:   Hypertension   GERD (gastroesophageal reflux disease)   Diabetes (HCC)   AF (paroxysmal atrial fibrillation) (HCC)   Essential hypertension   Acute on chronic diastolic CHF (congestive heart failure) (HCC)   Subjective:   [name] today has, No headache, No chest pain, No abdominal pain - No Nausea, No new weakness tingling or numbness, No Cough, No SOB.   Assessment & Plan   1. Acute on chronic diastolic congestive heart failure [name]) She has an upcoming appt with the heart failure  clinic on 07/12/21. Her daughter plans to reschedule this visit for after [name] sees the cardiologist and does the 2 week  ZIO monitor. The patient had previously been seen by the heart failure clinic and was told that they could not do much until she is seen by cardiology.   2. Permanent atrial fibrillation ([place]) Scheduled to se Dr. Garen Lah on 08/22/21. Per Dr. Fletcher Anon, she will have a 2 week ZIO monitor and then follow up visit to discuss results. During her hospitalization, her metoprolol was stopped. Her donepezil was also stopped due to risk of altered cardiac function on this medication with increased risk of prolonged QT interval, torsades de pointes and heart block especially in patient with known arrhythmias.   3. Essential hypertension Stable at this time, was started back on amlodipine when metoprolol was stopped. Patient was too bradycardic on metoprolol so she is now on a medication that is not known to cause bradycardia.   4. Stage 3b chronic kidney  disease ([place]) Has an upcoming appointment with her nephrologist Dr. Juleen China this month. Repeat BMP s/p recent hospitalization. Urince microalbumin is due and will be done at next office visit.  - Basic Metabolic Panel (BMET)  5. Anemia associated with stage 4 chronic renal failure (HCC) History and current anemia of chronic disease related to CKD stage 4. Repeat CBC s/p recent hospitalization.  - CBC with Differential/Platelet  6. Encounter for examination following treatment at hospital Repeat labs now that she has been discharged from hospital. Discussed medications and labs with patient and her daughter. She has a scheduled follow up visit with PCP in December. She has multiple other visits with specialists. [name] is her caregiver and understands and acknowledges signs and symptoms that warrant an ED visit. Patient and daughter were also encouraged to call the clinic if they have questions, concerns or need to be seen prior to scheduled follow up visit.  - CBC with Differential/Platelet - Basic Metabolic Panel (BMET)  7. Mild cognitive impairment Donepezil was stopped due to cardiac arrhythmia risk. Memantine does not have the same significant adverse reaction of increasing risk of heart block or dangerous cardiac arrhythmias. Once she has seen cardiology and nephrology, will consider starting memantine if her kidney function will allow and it is ok with cardiology.    Reason for frequent admissions/ER visits  -bradycardia due to medication -acute on chronic diastolic heart failure -shortness of breath      Objective:   Vitals:   07/05/21 1151  BP: 114/60  Pulse: 98  Resp: 16  Temp: 98.3 F (36.8 C)  SpO2: 99%  Weight: 123 lb 3.2 oz (55.9 kg)  Height: '4\' 10"'  (1.473 m)    Wt Readings from Last 3 Encounters:  07/05/21 123 lb 3.2 oz (55.9 kg)  06/30/21 127 lb 6.8 oz (57.8 kg)  06/17/21 130 lb (59 kg)    Allergies as of 07/05/2021   No Known Allergies       Medication List        Accurate as of July 05, 2021 12:27 PM. If you have any questions, ask your nurse or doctor.          acetaminophen 325 MG tablet Commonly known as: TYLENOL Take 650 mg by mouth every 6 (six) hours as needed.   amLODipine 5 MG tablet Commonly known as: NORVASC Take 1 tablet (5 mg total) by mouth daily.   atorvastatin 10 MG tablet Commonly known as: LIPITOR Take 1 tablet (10 mg total) by  mouth every evening.   Blood Pressure Monitor Kit Use three times daily to check blood pressure  DX:  I50.42   citalopram 20 MG tablet Commonly known as: CELEXA Take 1 tablet (20 mg total) by mouth daily.   cyanocobalamin 1000 MCG tablet Take 1 tablet (1,000 mcg total) by mouth daily.   ferrous sulfate 325 (65 FE) MG tablet Take 1 tablet (325 mg total) by mouth daily with breakfast.   linaclotide 145 MCG Caps capsule Commonly known as: Linzess Take 1 capsule (145 mcg total) by mouth daily before breakfast.   melatonin 3 MG Tabs tablet Take 3 mg by mouth at bedtime as needed (sleep).   pantoprazole 40 MG tablet Commonly known as: PROTONIX Take 1 tablet by mouth twice daily   rOPINIRole 0.5 MG tablet Commonly known as: REQUIP Take 1 tablet by mouth in the evening   Torsemide 40 MG Tabs Take 40 mg by mouth daily.   warfarin 2 MG tablet Commonly known as: COUMADIN Take 1 tablet (2 mg total) by mouth daily. Except on wed and sat take 4 mg         Physical Exam: Constitutional: Patient appears well-developed and well-nourished. Not in obvious distress. HENT: Normocephalic, atraumatic, External right and left ear normal. Oropharynx is clear and moist.  Eyes: Conjunctivae and EOM are normal. PERRLA, no scleral icterus. Neck: Normal ROM. Neck supple. No JVD. No tracheal deviation. No thyromegaly. CVS: RRR, S1/S2 +, no murmurs, no gallops, no carotid bruit.  Pulmonary: Effort and breath sounds normal, no stridor, rhonchi, wheezes, rales.   Abdominal: Soft. BS +, no distension, tenderness, rebound or guarding.  Musculoskeletal: Normal range of motion. No edema and no tenderness.  Lymphadenopathy: No lymphadenopathy noted, cervical, inguinal or axillary Neuro: Alert. Normal reflexes, muscle tone coordination. No cranial nerve deficit. Skin: Skin is warm and dry. No rash noted. Not diaphoretic. No erythema. No pallor. Psychiatric: Normal mood and affect. Behavior, judgment, thought content normal.   Data Review   Micro Results Recent Results (from the past 240 hour(s))  Resp Panel by RT-PCR (Flu A&B, Covid) Nasopharyngeal Swab     Status: None   Collection Time: 06/28/21 12:50 AM   Specimen: Nasopharyngeal Swab; Nasopharyngeal(NP) swabs in vial transport medium  Result Value Ref Range Status   SARS Coronavirus 2 by RT PCR NEGATIVE NEGATIVE Final    Comment: (NOTE) SARS-CoV-2 target nucleic acids are NOT DETECTED.  The SARS-CoV-2 RNA is generally detectable in upper respiratory specimens during the acute phase of infection. The lowest concentration of SARS-CoV-2 viral copies this assay can detect is 138 copies/mL. A negative result does not preclude SARS-Cov-2 infection and should not be used as the sole basis for treatment or other patient management decisions. A negative result may occur with  improper specimen collection/handling, submission of specimen other than nasopharyngeal swab, presence of viral mutation(s) within the areas targeted by this assay, and inadequate number of viral copies(<138 copies/mL). A negative result must be combined with clinical observations, patient history, and epidemiological information. The expected result is Negative.  Fact Sheet for Patients:  EntrepreneurPulse.com.au  Fact Sheet for Healthcare Providers:  IncredibleEmployment.be  This test is no t yet approved or cleared by the [place] FDA and  has been authorized for detection and/or  diagnosis of SARS-CoV-2 by FDA under an Emergency Use Authorization (EUA). This EUA will remain  in effect (meaning this test can be used) for the duration of the COVID-19 declaration under Section 564(b)(1) of the Act, 21  U.S.C.section 360bbb-3(b)(1), unless the authorization is terminated  or revoked sooner.       Influenza A by PCR NEGATIVE NEGATIVE Final   Influenza B by PCR NEGATIVE NEGATIVE Final    Comment: (NOTE) The Xpert Xpress SARS-CoV-2/FLU/RSV plus assay is intended as an aid in the diagnosis of influenza from Nasopharyngeal swab specimens and should not be used as a sole basis for treatment. Nasal washings and aspirates are unacceptable for Xpert Xpress SARS-CoV-2/FLU/RSV testing.  Fact Sheet for Patients: EntrepreneurPulse.com.au  Fact Sheet for Healthcare Providers: IncredibleEmployment.be  This test is not yet approved or cleared by the [place] FDA and has been authorized for detection and/or diagnosis of SARS-CoV-2 by FDA under an Emergency Use Authorization (EUA). This EUA will remain in effect (meaning this test can be used) for the duration of the COVID-19 declaration under Section 564(b)(1) of the Act, 21 U.S.C. section 360bbb-3(b)(1), unless the authorization is terminated or revoked.  Performed at [facility], [address] [place], [place] [address]      CBC Recent Labs  Lab 06/29/21 0622 06/30/21 0612  WBC 7.0 7.7  HGB 9.1* 9.4*  HCT 27.6* 28.0*  PLT 220 217  MCV 97.2 94.0  MCH 32.0 31.5  MCHC 33.0 33.6  RDW 14.8 14.6    Chemistries  Recent Labs  Lab 06/29/21 0622 06/30/21 0612  NA 139 136  K 3.9 3.9  CL 97* 95*  CO2 33* 31  GLUCOSE 102* 140*  BUN 34* 48*  CREATININE 2.42* 2.37*  CALCIUM 9.1 8.9  MG 2.3 2.2   ------------------------------------------------------------------------------------------------------------------ estimated creatinine clearance is 14 mL/min (A)  (by C-G formula based on SCr of 2.37 mg/dL (H)). ------------------------------------------------------------------------------------------------------------------ No results for input(s): HGBA1C in the last 72 hours. ------------------------------------------------------------------------------------------------------------------ No results for input(s): CHOL, HDL, LDLCALC, TRIG, CHOLHDL, LDLDIRECT in the last 72 hours. ------------------------------------------------------------------------------------------------------------------ No results for input(s): TSH, T4TOTAL, T3FREE, THYROIDAB in the last 72 hours.  Invalid input(s): FREET3 ------------------------------------------------------------------------------------------------------------------ No results for input(s): VITAMINB12, FOLATE, FERRITIN, TIBC, IRON, RETICCTPCT in the last 72 hours.  Coagulation profile Recent Labs  Lab 06/29/21 0622 06/30/21 0612  INR 1.3* 1.5*    No results for input(s): DDIMER in the last 72 hours.  Cardiac Enzymes No results for input(s): CKMB, TROPONINI, MYOGLOBIN in the last 168 hours.  Invalid input(s): CK ------------------------------------------------------------------------------------------------------------------ Invalid input(s): POCBNP  Return in 3 months (on 09/29/2021) for F/U, med refill, Landry Kamath PCP. Upcoming appointments include: Dr. Garen Lah on 08/22/21  Time Spent in minutes  30 Time spent with patient included reviewing progress notes, labs, imaging studies, and discussing plan for follow up.   This patient was seen by Jonetta Osgood, FNP-C in collaboration with Dr. Clayborn Bigness as a part of collaborative care agreement.  Jonetta Osgood MSN, FNP-C on 07/05/2021 at 12:27 PM   **Disclaimer: This note may have been dictated with voice recognition software. Similar sounding words can inadvertently be transcribed and this note may contain transcription errors which may not have  been corrected upon publication of note.**

## 2021-07-06 ENCOUNTER — Other Ambulatory Visit: Payer: Self-pay | Admitting: Internal Medicine

## 2021-07-06 ENCOUNTER — Other Ambulatory Visit: Payer: Self-pay

## 2021-07-06 DIAGNOSIS — I5033 Acute on chronic diastolic (congestive) heart failure: Secondary | ICD-10-CM

## 2021-07-07 ENCOUNTER — Other Ambulatory Visit: Payer: Self-pay

## 2021-07-07 ENCOUNTER — Telehealth: Payer: Self-pay

## 2021-07-07 NOTE — Telephone Encounter (Signed)
[name] to confirm pt is taking lisinopril

## 2021-07-07 NOTE — Telephone Encounter (Signed)
Please review and refused if she is not on this med

## 2021-07-07 NOTE — Patient Outreach (Signed)
[facility]) Care Management  07/07/2021  [name] August 19, 1941 [id number]     Transition of Care Referral  Referral Date: 07/07/2021 Referral [facility] Liaison Date of Discharge: 06/30/2021 [place] PCP Office Does TOC    Outreach attempt #1 to patient. No answer. RN CM left HIPAA compliant voicemail message along with contact info.    Plan: RN CM will make outreach attempt to patient within 3-4 business days.   Enzo Montgomery, RN,BSN,CCM [place] [facility] Telephonic Care Management Coordinator Direct Phone: [phone] Toll Free: [phone] Fax: [phone]

## 2021-07-08 ENCOUNTER — Other Ambulatory Visit: Payer: Self-pay

## 2021-07-08 ENCOUNTER — Ambulatory Visit (INDEPENDENT_AMBULATORY_CARE_PROVIDER_SITE_OTHER): Payer: Medicare HMO

## 2021-07-08 DIAGNOSIS — Z7901 Long term (current) use of anticoagulants: Secondary | ICD-10-CM | POA: Diagnosis not present

## 2021-07-08 NOTE — Progress Notes (Addendum)
Patient INR 1.5 as per alyssa  pt daughter advised take coumadin 2 mg daily sat,sun,tues and Thursday  and take coumadin 4 mg Monday,Wednesday  and Friday

## 2021-07-08 NOTE — Patient Outreach (Signed)
[facility]) Care Management  07/08/2021  [name] 02/05/1941 [id number]   Transition of Care Referral   Referral Date: 07/07/2021 Referral [facility] Liaison Date of Discharge: 06/30/2021 [place] PCP Office Does TOC   Unsuccessful outreach attempt #2 to patient.    Plan: RN CM will make outreach attempt to patient within 3-4 business days.  Enzo Montgomery, RN,BSN,CCM [place] [facility] Telephonic Care Management Coordinator Direct Phone: [phone] Toll Free: [phone] Fax: [phone]

## 2021-07-11 ENCOUNTER — Other Ambulatory Visit: Payer: Self-pay

## 2021-07-11 ENCOUNTER — Telehealth: Payer: Self-pay

## 2021-07-11 NOTE — Patient Outreach (Signed)
[facility]) Care Management  07/11/2021  [name] February 12, 1941 [id number]   Transition of Care Referral   Referral Date: 07/07/2021 Referral [facility] Liaison Date of Discharge: 06/30/2021 [place] PCP Office Does TOC   Unsuccessful outreach attempt to patient.      Plan: RN CM will make outreach attempt to patient within 3-4 wks if no response from letter mailed to patient.  Enzo Montgomery, RN,BSN,CCM [place] [facility] Telephonic Care Management Coordinator Direct Phone: [phone] Toll Free: [phone] Fax: [phone]

## 2021-07-11 NOTE — Telephone Encounter (Signed)
[name] [phone] from [facility] called [facility] that pt missed her PT appointment and daughter informed him that PT was to hard for her mother to do.

## 2021-07-12 ENCOUNTER — Other Ambulatory Visit: Payer: Self-pay

## 2021-07-12 ENCOUNTER — Ambulatory Visit: Payer: Medicare HMO | Attending: Family | Admitting: Family

## 2021-07-12 ENCOUNTER — Encounter: Payer: Self-pay | Admitting: Family

## 2021-07-12 VITALS — BP 120/49 | HR 79 | Resp 16 | Ht 60.0 in | Wt 126.0 lb

## 2021-07-12 DIAGNOSIS — Z833 Family history of diabetes mellitus: Secondary | ICD-10-CM | POA: Diagnosis not present

## 2021-07-12 DIAGNOSIS — I130 Hypertensive heart and chronic kidney disease with heart failure and stage 1 through stage 4 chronic kidney disease, or unspecified chronic kidney disease: Secondary | ICD-10-CM | POA: Diagnosis not present

## 2021-07-12 DIAGNOSIS — N184 Chronic kidney disease, stage 4 (severe): Secondary | ICD-10-CM | POA: Diagnosis not present

## 2021-07-12 DIAGNOSIS — F0390 Unspecified dementia without behavioral disturbance: Secondary | ICD-10-CM | POA: Diagnosis not present

## 2021-07-12 DIAGNOSIS — R001 Bradycardia, unspecified: Secondary | ICD-10-CM | POA: Insufficient documentation

## 2021-07-12 DIAGNOSIS — Z9049 Acquired absence of other specified parts of digestive tract: Secondary | ICD-10-CM | POA: Insufficient documentation

## 2021-07-12 DIAGNOSIS — E1122 Type 2 diabetes mellitus with diabetic chronic kidney disease: Secondary | ICD-10-CM | POA: Diagnosis not present

## 2021-07-12 DIAGNOSIS — Z8249 Family history of ischemic heart disease and other diseases of the circulatory system: Secondary | ICD-10-CM | POA: Diagnosis not present

## 2021-07-12 DIAGNOSIS — I5032 Chronic diastolic (congestive) heart failure: Secondary | ICD-10-CM | POA: Insufficient documentation

## 2021-07-12 DIAGNOSIS — R5383 Other fatigue: Secondary | ICD-10-CM | POA: Insufficient documentation

## 2021-07-12 DIAGNOSIS — J8410 Pulmonary fibrosis, unspecified: Secondary | ICD-10-CM | POA: Insufficient documentation

## 2021-07-12 DIAGNOSIS — Z515 Encounter for palliative care: Secondary | ICD-10-CM | POA: Diagnosis not present

## 2021-07-12 DIAGNOSIS — I10 Essential (primary) hypertension: Secondary | ICD-10-CM | POA: Diagnosis not present

## 2021-07-12 DIAGNOSIS — Z87891 Personal history of nicotine dependence: Secondary | ICD-10-CM | POA: Insufficient documentation

## 2021-07-12 NOTE — Patient Instructions (Addendum)
Continue weighing daily and call for an overnight weight gain of > 2 pounds or a weekly weight gain of >5 pounds.     Resume donepezil at the 5mg  dose. Monitor heart rate and let Dr Humphrey Rolls know if heart rate is less than 60

## 2021-07-12 NOTE — Progress Notes (Signed)
Patient ID: [name], female    DOB: 01-Feb-1941, 80 y.o.   MRN: [id number]  HPI  [name] is a 79 y/o female with a history of DM, hyperlipidemia, HTN, CKD, GERD, atrial fibrillation, anemia, pulmonary fibrosis, previous tobaccco use and chronic heart failure.   Echo report from 05/26/21 reviewed and showed an EF of 55% along with mild LVH. Echo report from 12/24/20 reviewed and showed an EF of 60-65% along with mild LAE and mild AR/AS.   Admitted 06/28/21 due to worsening shortness of breath. Initially given IV lasix with transition to oral diuretics. Lopressor stopped due to bradycardia. Discharged after 2 days. Admitted 05/24/21 due to acute on chronic HF. Cardiology consult obtained.  Initially given IV lasix and then changed to oral diuretics. Had abdominal pain but abdominal/pelvic CT was normal. Diuretic changed to QOD due to renal function. Discharged after 6 days. Admitted 02/06/21 due to acute on chronic HF. Cardiology and nephrology consults obtained. Able to be weaned off of oxygen. Gentle hydration given. Given steroids for bronchospasm. IV iron given for anemia. Discharged after 4 days.   She presents today for a follow-up visit with a chief complaint of minimal fatigue upon moderate exertion. She describes this as having been present for quite awhile. She has associated cough and difficulty sleeping along with this (tends to nap often during the day). She denies any dizziness, shortness of breath, chest pain, pedal edema, palpitations or weight gain.   Daughter says that patient's donepezil was stopped during her recent admission and she is unsure of why. She says that over the last few days, she has noticed that her mom's memory is worsening and she is repeatedly asking the same questions over and over.   Past Medical History:  Diagnosis Date   Anemia    Atrial fibrillation (HCC)    Chronic kidney disease    Congestive heart failure (CHF) (HCC)    Diabetes mellitus without  complication (HCC)    GERD (gastroesophageal reflux disease)    Hyperlipidemia    Hypertension    Pneumonia    Pulmonary fibrosis (HCC)    Past Surgical History:  Procedure Laterality Date   ABDOMINAL HYSTERECTOMY     APPENDECTOMY     CATARACT EXTRACTION     CHOLECYSTECTOMY     HERNIA REPAIR     right knee replacement     right nephroectomy     Family History  Problem Relation Age of Onset   Diabetes Mother    Breast cancer Mother    Heart disease Father    Diabetes Father    Diabetes Sister    Diabetes Brother    Cancer Brother    Social History   Tobacco Use   Smoking status: Former    Packs/day: 1.00    Types: Cigarettes    Quit date: 02/10/2021    Years since quitting: 0.4   Smokeless tobacco: Never  Substance Use Topics   Alcohol use: No   No Known Allergies   Prior to Admission medications   Medication Sig Start Date End Date Taking? Authorizing Provider  acetaminophen (TYLENOL) 325 MG tablet Take 650 mg by mouth every 6 (six) hours as needed.   Yes [provider]  amLODipine (NORVASC) 5 MG tablet Take 1 tablet (5 mg total) by mouth daily. 07/01/21  Yes Amy Nimrod, MD  atorvastatin (LIPITOR) 10 MG tablet Take 1 tablet (10 mg total) by mouth every evening. 12/06/20  Yes McDonough, Si Gaul, PA-C  Blood Pressure Monitor KIT Use three times daily to check blood pressure  DX:  I50.42 01/13/21  Yes Lavera Guise, MD  citalopram (CELEXA) 20 MG tablet Take 1 tablet (20 mg total) by mouth daily. 06/30/21  Yes Amy Nimrod, MD  donepezil (ARICEPT) 5 MG tablet Take 5 mg by mouth at bedtime.   Yes [provider]  ferrous sulfate 325 (65 FE) MG tablet Take 1 tablet (325 mg total) by mouth daily with breakfast. 12/30/20  Yes Val Riles, MD  linaclotide [facility]) 145 MCG CAPS capsule Take 1 capsule (145 mcg total) by mouth daily before breakfast. 04/19/21  Yes Lavera Guise, MD  melatonin 3 MG TABS tablet Take 3 mg by mouth at bedtime as needed (sleep).    Yes [provider]  pantoprazole (PROTONIX) 40 MG tablet Take 1 tablet by mouth twice daily 05/31/21  Yes Lavera Guise, MD  rOPINIRole (REQUIP) 0.5 MG tablet Take 1 tablet by mouth in the evening 05/07/21  Yes Lavera Guise, MD  torsemide 40 MG TABS Take 40 mg by mouth daily. 06/30/21  Yes Amy Nimrod, MD  vitamin B-12 1000 MCG tablet Take 1 tablet (1,000 mcg total) by mouth daily. 12/30/20  Yes Val Riles, MD  warfarin (COUMADIN) 2 MG tablet Take 1 tablet (2 mg total) by mouth daily. Except on wed and sat take 4 mg 05/04/21  Yes Lavera Guise, MD    Review of Systems  Constitutional:  Positive for fatigue. Negative for appetite change.  HENT:  Negative for congestion, postnasal drip and sore throat.   Eyes: Negative.   Respiratory:  Positive for cough. Negative for chest tightness, shortness of breath and wheezing.   Cardiovascular:  Negative for chest pain, palpitations and leg swelling.  Gastrointestinal:  Negative for abdominal distention and abdominal pain.  Endocrine: Negative.   Genitourinary: Negative.   Musculoskeletal:  Positive for back pain. Negative for neck pain.  Skin: Negative.   Allergic/Immunologic: Negative.   Neurological:  Negative for dizziness and light-headedness.  Hematological:  Negative for adenopathy. Bruises/bleeds easily.  Psychiatric/Behavioral:  Positive for confusion and sleep disturbance (trouble falling alseep; sleeping on 2 pillows). Negative for dysphoric mood. The patient is not nervous/anxious.    Vitals:   07/12/21 1101  BP: (!) 120/49  Pulse: 79  Resp: 16  SpO2: 98%  Weight: 126 lb (57.2 kg)  Height: 5' (1.524 m)   Wt Readings from Last 3 Encounters:  07/12/21 126 lb (57.2 kg)  07/05/21 123 lb 3.2 oz (55.9 kg)  06/30/21 127 lb 6.8 oz (57.8 kg)   Lab Results  Component Value Date   CREATININE 2.37 (H) 06/30/2021   CREATININE 2.42 (H) 06/29/2021   CREATININE 2.16 (H) 06/28/2021    Physical Exam Vitals and nursing note  reviewed. Exam conducted with a chaperone present (daughter).  Constitutional:      Appearance: Normal appearance.  HENT:     Head: Normocephalic and atraumatic.  Cardiovascular:     Rate and Rhythm: Normal rate. Rhythm irregular.  Pulmonary:     Effort: Pulmonary effort is normal. No respiratory distress.     Breath sounds: No wheezing or rales.  Abdominal:     General: There is no distension.     Palpations: Abdomen is soft.     Tenderness: There is no abdominal tenderness.  Musculoskeletal:        General: No tenderness.     Cervical back: Normal range of motion and neck supple.  Right lower leg: No edema.     Left lower leg: No edema.  Skin:    General: Skin is warm and dry.  Neurological:     General: No focal deficit present.     Mental Status: She is alert and oriented to person, place, and time.  Psychiatric:        Mood and Affect: Mood normal.        Behavior: Behavior normal.        Thought Content: Thought content normal.   Assessment & Plan:  1: Chronic heart failure with preserved ejection fraction along with strutural changes (LAE)- - NYHA class II - euvolemic today - weighing daily and says that her weight has been stable; reminded to call for an overnight weight gain of > 2 pounds or a weekly weight gain of > 5 pounds - weight up 12 pounds from last visit here 4 months ago; says that she's eating well - not adding salt and her daughter tries to look at food labels for sodium content; low sodium cookbook given - BP may not tolerate the use of entresto - renal function will not tolerate SGLT2 - sees cardiology (Agbor-Etang) 08/22/21 - does drink some water but also enjoys drinking pepsi; encouraged her to drink more water and less pepsi - BNP 06/28/21 was 1326.5  2: HTN- - BP looks good today  - saw PCP Stephanie Coup) 06/06/21 - BMP 06/30/21 reviewed and showed sodium 136, potassium 3.9, creatinine 2.37 and GFR 20  3: DM with CKD stage IV- - A1c 8/222 was  6.6% - was supposed to see nephrology (Kolluru) 06/08/21 but they cancelled and have to call and get it rescheduled  4: Pulmonary fibrosis- - saw pulmonology (Abernathy) 07/05/21 - palliative care visit done 06/17/21 - quit smoking 02/10/21   5: Dementia- - daughter has noticed that since her donepezil was stopped, patient has been repeating questions over & over that she wasn't doing before - explained that it looks like it was stopped along with her lopressor due to bradycardia - advised daughter to resume the donepezil and to keep up with patient's HR; if it drops to <60, she is call PCP to discuss further   Medication list reviewed.   Return in 3 months or sooner for any questions/problems before then.

## 2021-07-13 DIAGNOSIS — I5033 Acute on chronic diastolic (congestive) heart failure: Secondary | ICD-10-CM | POA: Diagnosis not present

## 2021-07-13 DIAGNOSIS — I509 Heart failure, unspecified: Secondary | ICD-10-CM | POA: Diagnosis not present

## 2021-07-14 ENCOUNTER — Ambulatory Visit (INDEPENDENT_AMBULATORY_CARE_PROVIDER_SITE_OTHER): Payer: Medicare HMO

## 2021-07-14 DIAGNOSIS — I480 Paroxysmal atrial fibrillation: Secondary | ICD-10-CM | POA: Diagnosis not present

## 2021-07-14 DIAGNOSIS — Z7901 Long term (current) use of anticoagulants: Secondary | ICD-10-CM | POA: Diagnosis not present

## 2021-07-14 NOTE — Progress Notes (Cosign Needed)
Patient [name] as per Ander Purpura pt daughter advised take coumadin 2 mg daily sat,sun,tues and Thursday  and take coumadin 4 mg Monday,Wednesday  and Friday.  No changes at this time.  Spoke to pt's daughter and informed her that no changes to continue as before

## 2021-07-15 LAB — PROTIME-INR

## 2021-07-16 DIAGNOSIS — D631 Anemia in chronic kidney disease: Secondary | ICD-10-CM | POA: Diagnosis not present

## 2021-07-16 DIAGNOSIS — I130 Hypertensive heart and chronic kidney disease with heart failure and stage 1 through stage 4 chronic kidney disease, or unspecified chronic kidney disease: Secondary | ICD-10-CM | POA: Diagnosis not present

## 2021-07-16 DIAGNOSIS — N185 Chronic kidney disease, stage 5: Secondary | ICD-10-CM | POA: Diagnosis not present

## 2021-07-16 DIAGNOSIS — J9611 Chronic respiratory failure with hypoxia: Secondary | ICD-10-CM | POA: Diagnosis not present

## 2021-07-16 DIAGNOSIS — K219 Gastro-esophageal reflux disease without esophagitis: Secondary | ICD-10-CM | POA: Diagnosis not present

## 2021-07-16 DIAGNOSIS — M1990 Unspecified osteoarthritis, unspecified site: Secondary | ICD-10-CM | POA: Diagnosis not present

## 2021-07-16 DIAGNOSIS — E785 Hyperlipidemia, unspecified: Secondary | ICD-10-CM | POA: Diagnosis not present

## 2021-07-16 DIAGNOSIS — E1122 Type 2 diabetes mellitus with diabetic chronic kidney disease: Secondary | ICD-10-CM | POA: Diagnosis not present

## 2021-07-16 DIAGNOSIS — I5043 Acute on chronic combined systolic (congestive) and diastolic (congestive) heart failure: Secondary | ICD-10-CM | POA: Diagnosis not present

## 2021-07-17 DIAGNOSIS — I509 Heart failure, unspecified: Secondary | ICD-10-CM | POA: Diagnosis not present

## 2021-07-17 DIAGNOSIS — I5033 Acute on chronic diastolic (congestive) heart failure: Secondary | ICD-10-CM | POA: Diagnosis not present

## 2021-07-19 ENCOUNTER — Other Ambulatory Visit: Payer: Self-pay

## 2021-07-19 ENCOUNTER — Other Ambulatory Visit: Payer: Self-pay | Admitting: Internal Medicine

## 2021-07-19 ENCOUNTER — Other Ambulatory Visit: Payer: Self-pay | Admitting: Physician Assistant

## 2021-07-19 DIAGNOSIS — E782 Mixed hyperlipidemia: Secondary | ICD-10-CM

## 2021-07-19 NOTE — Telephone Encounter (Signed)
Pleased/c this med she is no longer on this med

## 2021-07-21 ENCOUNTER — Telehealth: Payer: Self-pay

## 2021-07-21 LAB — PROTIME-INR

## 2021-07-23 DIAGNOSIS — E785 Hyperlipidemia, unspecified: Secondary | ICD-10-CM | POA: Diagnosis not present

## 2021-07-23 DIAGNOSIS — I130 Hypertensive heart and chronic kidney disease with heart failure and stage 1 through stage 4 chronic kidney disease, or unspecified chronic kidney disease: Secondary | ICD-10-CM | POA: Diagnosis not present

## 2021-07-23 DIAGNOSIS — M1990 Unspecified osteoarthritis, unspecified site: Secondary | ICD-10-CM | POA: Diagnosis not present

## 2021-07-23 DIAGNOSIS — N185 Chronic kidney disease, stage 5: Secondary | ICD-10-CM | POA: Diagnosis not present

## 2021-07-23 DIAGNOSIS — J9611 Chronic respiratory failure with hypoxia: Secondary | ICD-10-CM | POA: Diagnosis not present

## 2021-07-23 DIAGNOSIS — D631 Anemia in chronic kidney disease: Secondary | ICD-10-CM | POA: Diagnosis not present

## 2021-07-23 DIAGNOSIS — I5043 Acute on chronic combined systolic (congestive) and diastolic (congestive) heart failure: Secondary | ICD-10-CM | POA: Diagnosis not present

## 2021-07-23 DIAGNOSIS — K219 Gastro-esophageal reflux disease without esophagitis: Secondary | ICD-10-CM | POA: Diagnosis not present

## 2021-07-23 DIAGNOSIS — E1122 Type 2 diabetes mellitus with diabetic chronic kidney disease: Secondary | ICD-10-CM | POA: Diagnosis not present

## 2021-07-26 ENCOUNTER — Telehealth: Payer: Self-pay

## 2021-07-26 NOTE — Telephone Encounter (Signed)
Left vm to schedule Holter monitor-Toni

## 2021-07-28 ENCOUNTER — Ambulatory Visit (INDEPENDENT_AMBULATORY_CARE_PROVIDER_SITE_OTHER): Payer: Medicare HMO

## 2021-07-28 DIAGNOSIS — Z7901 Long term (current) use of anticoagulants: Secondary | ICD-10-CM

## 2021-07-28 NOTE — Progress Notes (Signed)
Pt INR 3.1 as per alyssa no change in medication continue same med as prescribed

## 2021-07-29 ENCOUNTER — Other Ambulatory Visit (INDEPENDENT_AMBULATORY_CARE_PROVIDER_SITE_OTHER): Payer: Medicare HMO

## 2021-07-29 ENCOUNTER — Other Ambulatory Visit: Payer: Self-pay

## 2021-07-29 ENCOUNTER — Other Ambulatory Visit: Payer: Self-pay | Admitting: Nurse Practitioner

## 2021-07-29 DIAGNOSIS — I480 Paroxysmal atrial fibrillation: Secondary | ICD-10-CM | POA: Diagnosis not present

## 2021-07-29 DIAGNOSIS — M150 Primary generalized (osteo)arthritis: Secondary | ICD-10-CM

## 2021-07-29 MED ORDER — TRAMADOL HCL 50 MG PO TABS: 50.0000 mg | ORAL_TABLET | Freq: Four times a day (QID) | ORAL | 0 refills | Status: DC | PRN

## 2021-07-29 MED ORDER — TORSEMIDE 40 MG PO TABS: 40.0000 mg | ORAL_TABLET | Freq: Every day | ORAL | 3 refills | Status: DC

## 2021-07-30 DIAGNOSIS — E1122 Type 2 diabetes mellitus with diabetic chronic kidney disease: Secondary | ICD-10-CM | POA: Diagnosis not present

## 2021-07-30 DIAGNOSIS — E785 Hyperlipidemia, unspecified: Secondary | ICD-10-CM | POA: Diagnosis not present

## 2021-07-30 DIAGNOSIS — J9611 Chronic respiratory failure with hypoxia: Secondary | ICD-10-CM | POA: Diagnosis not present

## 2021-07-30 DIAGNOSIS — D631 Anemia in chronic kidney disease: Secondary | ICD-10-CM | POA: Diagnosis not present

## 2021-07-30 DIAGNOSIS — K219 Gastro-esophageal reflux disease without esophagitis: Secondary | ICD-10-CM | POA: Diagnosis not present

## 2021-07-30 DIAGNOSIS — I130 Hypertensive heart and chronic kidney disease with heart failure and stage 1 through stage 4 chronic kidney disease, or unspecified chronic kidney disease: Secondary | ICD-10-CM | POA: Diagnosis not present

## 2021-07-30 DIAGNOSIS — I5043 Acute on chronic combined systolic (congestive) and diastolic (congestive) heart failure: Secondary | ICD-10-CM | POA: Diagnosis not present

## 2021-07-30 DIAGNOSIS — N185 Chronic kidney disease, stage 5: Secondary | ICD-10-CM | POA: Diagnosis not present

## 2021-07-30 DIAGNOSIS — M1990 Unspecified osteoarthritis, unspecified site: Secondary | ICD-10-CM | POA: Diagnosis not present

## 2021-08-01 ENCOUNTER — Other Ambulatory Visit: Payer: Self-pay

## 2021-08-01 NOTE — Patient Outreach (Signed)
[facility]) Care Management  08/01/2021  GINI CAPUTO 02-13-1941 [id number]   Transition of Care Referral   Referral Date: 07/07/2021 Referral [facility] Liaison Date of Discharge: 06/30/2021 [place] PCP Office Does TOC   Multiple attempts to establish contact with patient without success. No response from letter mailed to patient. Case is being closed at this time.    Plan: RN CM will close case.   Enzo Montgomery, RN,BSN,CCM [place] [facility] Telephonic Care Management Coordinator Direct Phone: [phone] Toll Free: [phone] Fax: [phone]

## 2021-08-02 ENCOUNTER — Telehealth: Payer: Self-pay

## 2021-08-02 ENCOUNTER — Other Ambulatory Visit: Payer: Self-pay

## 2021-08-02 ENCOUNTER — Ambulatory Visit: Payer: Medicare HMO | Admitting: Nurse Practitioner

## 2021-08-02 ENCOUNTER — Ambulatory Visit (INDEPENDENT_AMBULATORY_CARE_PROVIDER_SITE_OTHER): Payer: Medicare HMO

## 2021-08-02 DIAGNOSIS — I4891 Unspecified atrial fibrillation: Secondary | ICD-10-CM | POA: Diagnosis not present

## 2021-08-02 DIAGNOSIS — Z7901 Long term (current) use of anticoagulants: Secondary | ICD-10-CM

## 2021-08-02 MED ORDER — TORSEMIDE 40 MG PO TABS: 40.0000 mg | ORAL_TABLET | Freq: Every day | ORAL | 3 refills | Status: DC

## 2021-08-03 NOTE — Progress Notes (Signed)
Pt INR 3.0  as per alyssa is no change continue same med as prescribed

## 2021-08-04 NOTE — Telephone Encounter (Signed)
error 

## 2021-08-05 LAB — PROTIME-INR

## 2021-08-08 ENCOUNTER — Telehealth: Payer: Self-pay | Admitting: Cardiology

## 2021-08-08 NOTE — Telephone Encounter (Addendum)
Received [employer] box from our front desk. Box was addressed to the patient, but has "TRL # 4" listed on the box. This is then marked out with a handwritten note stating "not # 4" listed on the box.  The patient's address in our system is: [address]  [place] [address]   The correct address is listed except for "[address]" on the [employer] box.  I have attempted to call the primary # on file to see if the patient ever received this or was this returned to our office by someone else.  No answer- I left a message to please call back. Primary # is listed for the patient's daughter, June.  [employer] box placed back at the front desk in front of the nurse bins.

## 2021-08-08 NOTE — Telephone Encounter (Signed)
Volunteer dropped off [employer] boxes at front desk States it was dropped off with valet  Given to triage to review

## 2021-08-10 NOTE — Telephone Encounter (Signed)
Attempted to call the patient. No answer- I left a message to please call back.  

## 2021-08-11 ENCOUNTER — Other Ambulatory Visit: Payer: Self-pay

## 2021-08-11 ENCOUNTER — Ambulatory Visit (INDEPENDENT_AMBULATORY_CARE_PROVIDER_SITE_OTHER): Payer: Medicare HMO

## 2021-08-11 DIAGNOSIS — I480 Paroxysmal atrial fibrillation: Secondary | ICD-10-CM

## 2021-08-11 LAB — PROTIME-INR

## 2021-08-12 NOTE — Progress Notes (Signed)
08/11/21 Home PT/INR was 2.5.  per Ander Purpura there is no changes and pt to continue recent dosing

## 2021-08-15 NOTE — Telephone Encounter (Signed)
I spoke with the patient's daughter, June. I advised we received the Zio XT monitor that was mailed to the patient was brought back to our office unopened.  Per June, they did not receive the monitor, then Dr. Humphrey Rolls (PCP) ordered a monitor that the patient wore. This was mailed back this past Friday per June.  She is aware of the new patient appointment with Dr. Garen Lah scheduled for 08/22/21, but would like to defer this for now. The patient is scheduled to see Dr. Humphrey Rolls on 08/30/21. June advised the patient has "had a-fib for 40 years." They will call back to reschedule if Dr. Humphrey Rolls feels like the patient needs the appointment based on the monitor results.   June was appreciative for the call.  I have also reached out to Hall with [employer] about un enrolling the patient. She will take care of this. She advised we will need to mail the monitor back to [employer] as the mailed monitors cannot be placed back in the office stock to be used.   Serial # [id number] provided to Almyra Free.  The monitor has been placed in our outgoing mail to be returned to [place].  The patient's appointment has been cancelled for 08/22/21.

## 2021-08-16 DIAGNOSIS — I480 Paroxysmal atrial fibrillation: Secondary | ICD-10-CM | POA: Diagnosis not present

## 2021-08-16 LAB — PROTIME-INR

## 2021-08-17 ENCOUNTER — Other Ambulatory Visit: Payer: Self-pay | Admitting: Nurse Practitioner

## 2021-08-17 DIAGNOSIS — L602 Onychogryphosis: Secondary | ICD-10-CM

## 2021-08-17 DIAGNOSIS — Z7901 Long term (current) use of anticoagulants: Secondary | ICD-10-CM

## 2021-08-18 ENCOUNTER — Other Ambulatory Visit: Payer: Self-pay | Admitting: Internal Medicine

## 2021-08-18 ENCOUNTER — Encounter: Payer: Self-pay | Admitting: Podiatry

## 2021-08-18 ENCOUNTER — Other Ambulatory Visit: Payer: Self-pay

## 2021-08-18 ENCOUNTER — Encounter (INDEPENDENT_AMBULATORY_CARE_PROVIDER_SITE_OTHER): Payer: Self-pay

## 2021-08-18 ENCOUNTER — Ambulatory Visit (INDEPENDENT_AMBULATORY_CARE_PROVIDER_SITE_OTHER): Payer: Medicare HMO | Admitting: Podiatry

## 2021-08-18 DIAGNOSIS — E1149 Type 2 diabetes mellitus with other diabetic neurological complication: Secondary | ICD-10-CM | POA: Diagnosis not present

## 2021-08-18 DIAGNOSIS — B351 Tinea unguium: Secondary | ICD-10-CM

## 2021-08-18 DIAGNOSIS — M79675 Pain in left toe(s): Secondary | ICD-10-CM

## 2021-08-18 DIAGNOSIS — M79674 Pain in right toe(s): Secondary | ICD-10-CM

## 2021-08-18 DIAGNOSIS — Z794 Long term (current) use of insulin: Secondary | ICD-10-CM

## 2021-08-18 DIAGNOSIS — G2581 Restless legs syndrome: Secondary | ICD-10-CM

## 2021-08-18 DIAGNOSIS — L600 Ingrowing nail: Secondary | ICD-10-CM

## 2021-08-18 NOTE — Progress Notes (Signed)
This patient returns to my office for at risk foot care.  This patient requires this care by a professional since this patient will be at risk due to having coagulation defect. CKD amd DM with kidney disease. She has severe pain inside border left big toe.  She presents to the office with her daughter. this patient is unable to cut nails herself since the patient cannot reach her nails.Patient has not been seen in over one year.These nails are painful walking and wearing shoes.  This patient presents for at risk foot care today.  General Appearance  Alert, conversant and in no acute stress.  Vascular  Dorsalis pedis and posterior tibial  pulses are not  palpable  bilaterally.  Capillary return is within normal limits  bilaterally. Cold feetbilaterally.  Neurologic  Senn-Weinstein monofilament wire test within normal limits  bilaterally. Muscle power within normal limits bilaterally.  Nails Thick disfigured discolored nails with subungual debris  from hallux to fifth toes bilaterally. No evidence of bacterial infection or drainage bilaterally. Marked incurvation medial border  with severe pain upon palpation.  Orthopedic  No limitations of motion  feet .  No crepitus or effusions noted.  No bony pathology or digital deformities noted.  Skin  normotropic skin with no porokeratosis noted bilaterally.  No signs of infections or ulcers noted.     Onychomycosis  Pain in right toes  Pain in left toes Ingrown nail left hallux.  Consent was obtained for treatment procedures.   Mechanical debridement of nails 1-5  bilaterally performed with a nail nipper.  Filed with dremel without incident. Incision and drainage medial border left hallux.  Neosporin/DSD.   Return office visit   4 months                  Told patient to return for periodic foot care and evaluation due to potential at risk complications.   Gardiner Barefoot DPM

## 2021-08-22 ENCOUNTER — Ambulatory Visit: Payer: Medicare HMO | Admitting: Cardiology

## 2021-08-22 ENCOUNTER — Telehealth: Payer: Self-pay

## 2021-08-22 NOTE — Telephone Encounter (Signed)
[name] for daughter to return call to see if pt is using oxygen

## 2021-08-23 ENCOUNTER — Telehealth: Payer: Self-pay | Admitting: Internal Medicine

## 2021-08-23 NOTE — Chronic Care Management (AMB) (Signed)
  Chronic Care Management   Outreach Note  08/23/2021 Name: [name] MRN: [id number] DOB: 1941/02/01  Referred by: Lavera Guise, MD Reason for referral : No chief complaint on file.   An unsuccessful telephone outreach was attempted today. The patient was referred to the pharmacist for assistance with care management and care coordination.   Follow Up Plan:   [name] Upstream Scheduler

## 2021-08-25 ENCOUNTER — Ambulatory Visit: Payer: Medicare HMO

## 2021-08-25 ENCOUNTER — Other Ambulatory Visit: Payer: Self-pay

## 2021-08-25 DIAGNOSIS — Z7901 Long term (current) use of anticoagulants: Secondary | ICD-10-CM | POA: Diagnosis not present

## 2021-08-25 MED ORDER — AMLODIPINE BESYLATE 5 MG PO TABS: 5.0000 mg | ORAL_TABLET | Freq: Every day | ORAL | 3 refills | Status: DC

## 2021-08-25 MED ORDER — CITALOPRAM HYDROBROMIDE 20 MG PO TABS: 20.0000 mg | ORAL_TABLET | Freq: Every day | ORAL | 3 refills | Status: DC

## 2021-08-25 NOTE — Progress Notes (Signed)
Home INR was 2.3, per Alyssa no change

## 2021-08-30 ENCOUNTER — Encounter: Payer: Self-pay | Admitting: Internal Medicine

## 2021-08-30 ENCOUNTER — Ambulatory Visit (INDEPENDENT_AMBULATORY_CARE_PROVIDER_SITE_OTHER): Payer: Medicare HMO | Admitting: Internal Medicine

## 2021-08-30 ENCOUNTER — Other Ambulatory Visit: Payer: Self-pay

## 2021-08-30 VITALS — BP 123/81 | HR 102 | Temp 98.0°F | Resp 16 | Ht <= 58 in | Wt 137.0 lb

## 2021-08-30 DIAGNOSIS — N1832 Chronic kidney disease, stage 3b: Secondary | ICD-10-CM | POA: Diagnosis not present

## 2021-08-30 DIAGNOSIS — D631 Anemia in chronic kidney disease: Secondary | ICD-10-CM | POA: Diagnosis not present

## 2021-08-30 DIAGNOSIS — I4891 Unspecified atrial fibrillation: Secondary | ICD-10-CM

## 2021-08-30 DIAGNOSIS — E1165 Type 2 diabetes mellitus with hyperglycemia: Secondary | ICD-10-CM | POA: Diagnosis not present

## 2021-08-30 DIAGNOSIS — N184 Chronic kidney disease, stage 4 (severe): Secondary | ICD-10-CM | POA: Diagnosis not present

## 2021-08-30 DIAGNOSIS — R0902 Hypoxemia: Secondary | ICD-10-CM | POA: Diagnosis not present

## 2021-08-30 LAB — POCT GLYCOSYLATED HEMOGLOBIN (HGB A1C): Hemoglobin A1C: 6.3 % — AB (ref 4.0–5.6)

## 2021-08-30 NOTE — Progress Notes (Signed)
[facility] [place], [place] [address]  Internal MEDICINE  Office Visit Note  Patient Name: [name]  [id number]  [id number]  Date of Service: 09/25/2021  Chief Complaint  Patient presents with   Follow-up   Diabetes   Gastroesophageal Reflux   Hyperlipidemia   Hypertension   Anemia   Sinusitis    Going for 10 days allergies    HPI  Patient is brought in by her daughter as always for routine follow-up and to discuss Holter monitor results, patient has been having problem with her heart rate and has been in and out of the hospital for congestive heart failure and atrial fibrillation. Patient also has developed bradycardia while being on beta-blockers Holter monitor was done to see if patient might need pacemaker Her appetite has been better and she has been eating a little bit more and has not been watching her diet. Patient also has stopped using her oxygen thinks that she does not need it anymore Has chronic kidney disease nonadherence to doctor's appointment is present Current Medication: Outpatient Encounter Medications as of 08/30/2021  Medication Sig Note   acetaminophen (TYLENOL) 325 MG tablet Take 650 mg by mouth every 6 (six) hours as needed.    amLODipine (NORVASC) 5 MG tablet Take 1 tablet (5 mg total) by mouth daily.    atorvastatin (LIPITOR) 10 MG tablet TAKE 1 TABLET BY MOUTH ONCE DAILY IN THE EVENING    Blood Pressure Monitor KIT Use three times daily to check blood pressure  DX:  I50.42    citalopram (CELEXA) 20 MG tablet Take 1 tablet (20 mg total) by mouth daily.    donepezil (ARICEPT) 5 MG tablet Take 5 mg by mouth at bedtime.    ferrous sulfate 325 (65 FE) MG tablet Take 1 tablet (325 mg total) by mouth daily with breakfast.    linaclotide (LINZESS) 145 MCG CAPS capsule Take 1 capsule (145 mcg total) by mouth daily before breakfast. 05/24/2021: LF 04/19/21 #90   melatonin 3 MG TABS tablet Take 3 mg by mouth at bedtime as needed  (sleep).    pantoprazole (PROTONIX) 40 MG tablet Take 1 tablet by mouth twice daily    rOPINIRole (REQUIP) 0.5 MG tablet Take 1 tablet by mouth in the evening    Torsemide 40 MG TABS Take 40 mg by mouth daily.    vitamin B-12 1000 MCG tablet Take 1 tablet (1,000 mcg total) by mouth daily. 05/24/2021: Not on pt med list from pharmacy   warfarin (COUMADIN) 2 MG tablet Take 1 tablet (2 mg total) by mouth daily. Except on wed and sat take 4 mg 05/24/2021: LF 7.13.22 #40 for a 28 day supply   [DISCONTINUED] traMADol (ULTRAM) 50 MG tablet Take 1 tablet (50 mg total) by mouth every 6 (six) hours as needed for moderate pain or severe pain.    No facility-administered encounter medications on file as of 08/30/2021.    Surgical History: Past Surgical History:  Procedure Laterality Date   ABDOMINAL HYSTERECTOMY     APPENDECTOMY     CATARACT EXTRACTION     CHOLECYSTECTOMY     HERNIA REPAIR     right knee replacement     right nephroectomy      Medical History: Past Medical History:  Diagnosis Date   Anemia    Atrial fibrillation (HCC)    Chronic kidney disease    Congestive heart failure (CHF) (HCC)    Diabetes mellitus without complication ([place])  GERD (gastroesophageal reflux disease)    Hyperlipidemia    Hypertension    Pneumonia    Pulmonary fibrosis ([place])     Family History: Family History  Problem Relation Age of Onset   Diabetes Mother    Breast cancer Mother    Heart disease Father    Diabetes Father    Diabetes Sister    Diabetes Brother    Cancer Brother     Social History   Socioeconomic History   Marital status: Widowed    Spouse name: Not on file   Number of children: Not on file   Years of education: Not on file   Highest education level: Not on file  Occupational History   Occupation: retired  Tobacco Use   Smoking status: Former    Packs/day: 1.00    Types: Cigarettes    Quit date: 02/10/2021    Years since quitting: 0.6   Smokeless tobacco: Never   Vaping Use   Vaping Use: Never used  Substance and Sexual Activity   Alcohol use: No   Drug use: No   Sexual activity: Not Currently  Other Topics Concern   Not on file  Social History Narrative   Lives with daughter   Social Determinants of Health   Financial Resource Strain: Not on file  Food Insecurity: Not on file  Transportation Needs: Not on file  Physical Activity: Not on file  Stress: Not on file  Social Connections: Not on file  Intimate Partner Violence: Not on file      Review of Systems  Constitutional:  Negative for chills, fatigue and unexpected weight change.  HENT:  Positive for postnasal drip. Negative for congestion, rhinorrhea, sneezing and sore throat.   Eyes:  Negative for redness.  Respiratory:  Negative for cough, chest tightness and shortness of breath.   Cardiovascular:  Negative for chest pain and palpitations.  Gastrointestinal:  Negative for abdominal pain, constipation, diarrhea, nausea and vomiting.  Genitourinary:  Negative for dysuria and frequency.  Musculoskeletal:  Negative for arthralgias, back pain, joint swelling and neck pain.  Skin:  Negative for rash.  Neurological:  Positive for weakness. Negative for tremors and numbness.  Hematological:  Negative for adenopathy. Does not bruise/bleed easily.  Psychiatric/Behavioral:  Negative for behavioral problems (Depression), sleep disturbance and suicidal ideas. The patient is not nervous/anxious.    Vital Signs: BP 123/81   Pulse (!) 102   Temp 98 F (36.7 C)   Resp 16   Ht '4\' 10"'  (1.473 m)   Wt 137 lb (62.1 kg)   SpO2 98%   BMI 28.63 kg/m    Physical Exam Constitutional:      Appearance: Normal appearance.  HENT:     Head: Normocephalic and atraumatic.     Nose: Nose normal.     Mouth/Throat:     Mouth: Mucous membranes are moist.     Pharynx: No posterior oropharyngeal erythema.  Eyes:     Extraocular Movements: Extraocular movements intact.     Pupils: Pupils are  equal, round, and reactive to light.  Cardiovascular:     Rate and Rhythm: Rhythm irregular.     Pulses: Normal pulses.     Heart sounds: Normal heart sounds.  Pulmonary:     Effort: Pulmonary effort is normal.     Breath sounds: Normal breath sounds.  Musculoskeletal:     Right foot: Normal range of motion.     Left foot: Normal range of motion.  Feet:  Right foot:     Protective Sensation: 2 sites tested.  2 sites sensed.     Skin integrity: Skin integrity normal.     Toenail Condition: Right toenails are normal.     Left foot:     Protective Sensation: 2 sites tested.  2 sites sensed.     Skin integrity: Skin integrity normal.     Toenail Condition: Left toenails are normal.  Neurological:     General: No focal deficit present.     Mental Status: She is alert.  Psychiatric:        Mood and Affect: Mood normal.        Behavior: Behavior normal.       Assessment/Plan: 1. Uncontrolled type 2 diabetes mellitus with hyperglycemia ([place]) Will continue on therapy as before continue to monitor her renal functions - POCT HgB A1C - Microalbumin, urine - CBC with Differential/Platelet - Basic Metabolic Panel (BMET) - TSH + free T4  2. Atrial fibrillation, unspecified type ([place]) Holter monitor is abnormal and will get benefit from pacemaker if needed we will schedule appointment with cardiology - CBC with Differential/Platelet - Basic Metabolic Panel (BMET) - TSH + free T4  3. Anemia associated with stage 4 chronic renal failure (HCC) This is due to chronic kidney disease patient has required blood transfusion in the past we will encourage her to see hematology and nephrology - CBC with Differential/Platelet - Basic Metabolic Panel (BMET) - TSH + free T4  4. Stage 3b chronic kidney disease ([place]) Continue to get worse patient has seen nephrology in the past - CBC with Differential/Platelet - Basic Metabolic Panel (BMET) - TSH + free T4  5. Hypoxemia She has stopped  using her oxygen we will get overnight pulse oximetry to see if she we can requalify her for overnight shoes with oxygen which will help her maintain O2 saturations and prevent acute exacerbation of congestive heart failure - Pulse oximetry, overnight; Future   General Counseling: Rucha verbalizes understanding of the findings of todays visit and agrees with plan of treatment. I have discussed any further diagnostic evaluation that may be needed or ordered today. We also reviewed her medications today. she has been encouraged to call the office with any questions or concerns that should arise related to todays visit.    Orders Placed This Encounter  Procedures   Microalbumin, urine   CBC with Differential/Platelet   Basic Metabolic Panel (BMET)   TSH + free T4   Pulse oximetry, overnight   POCT HgB A1C    No orders of the defined types were placed in this encounter.   Total time spent:35 Minutes Time spent includes review of chart, medications, test results, and follow up plan with the patient.    Controlled Substance Database was reviewed by me.   Dr Lavera Guise Internal medicine

## 2021-08-31 LAB — CBC WITH DIFFERENTIAL/PLATELET
Basophils Absolute: 0.1 10*3/uL (ref 0.0–0.2)
Basos: 1 %
EOS (ABSOLUTE): 0.2 10*3/uL (ref 0.0–0.4)
Eos: 3 %
Hematocrit: 28.9 % — ABNORMAL LOW (ref 34.0–46.6)
Hemoglobin: 9.4 g/dL — ABNORMAL LOW (ref 11.1–15.9)
Immature Grans (Abs): 0.1 10*3/uL (ref 0.0–0.1)
Immature Granulocytes: 1 %
Lymphocytes Absolute: 1.8 10*3/uL (ref 0.7–3.1)
Lymphs: 28 %
MCH: 31 pg (ref 26.6–33.0)
MCHC: 32.5 g/dL (ref 31.5–35.7)
MCV: 95 fL (ref 79–97)
Monocytes Absolute: 0.4 10*3/uL (ref 0.1–0.9)
Monocytes: 7 %
Neutrophils Absolute: 3.8 10*3/uL (ref 1.4–7.0)
Neutrophils: 60 %
Platelets: 261 10*3/uL (ref 150–450)
RBC: 3.03 x10E6/uL — ABNORMAL LOW (ref 3.77–5.28)
RDW: 11.9 % (ref 11.7–15.4)
WBC: 6.4 10*3/uL (ref 3.4–10.8)

## 2021-08-31 LAB — MICROALBUMIN, URINE: Microalbumin, Urine: 121.3 ug/mL

## 2021-08-31 LAB — BASIC METABOLIC PANEL
BUN/Creatinine Ratio: 16 (ref 12–28)
BUN: 42 mg/dL — ABNORMAL HIGH (ref 8–27)
CO2: 25 mmol/L (ref 20–29)
Calcium: 8.9 mg/dL (ref 8.7–10.3)
Chloride: 101 mmol/L (ref 96–106)
Creatinine, Ser: 2.69 mg/dL — ABNORMAL HIGH (ref 0.57–1.00)
Glucose: 290 mg/dL — ABNORMAL HIGH (ref 70–99)
Potassium: 5 mmol/L (ref 3.5–5.2)
Sodium: 141 mmol/L (ref 134–144)
eGFR: 17 mL/min/{1.73_m2} — ABNORMAL LOW (ref >59)

## 2021-08-31 LAB — TSH+FREE T4
Free T4: 1.26 ng/dL (ref 0.82–1.77)
TSH: 3.03 u[IU]/mL (ref 0.450–4.500)

## 2021-09-06 LAB — PROTIME-INR

## 2021-09-07 ENCOUNTER — Ambulatory Visit (INDEPENDENT_AMBULATORY_CARE_PROVIDER_SITE_OTHER): Payer: Medicare HMO

## 2021-09-07 ENCOUNTER — Other Ambulatory Visit: Payer: Self-pay

## 2021-09-07 DIAGNOSIS — I4891 Unspecified atrial fibrillation: Secondary | ICD-10-CM | POA: Diagnosis not present

## 2021-09-07 NOTE — Progress Notes (Signed)
Home INR 3.2 per Alyssa no change to dosage, continue same as prescribed

## 2021-09-13 ENCOUNTER — Other Ambulatory Visit: Payer: Self-pay

## 2021-09-13 ENCOUNTER — Telehealth: Payer: Self-pay

## 2021-09-14 ENCOUNTER — Other Ambulatory Visit: Payer: Self-pay | Admitting: Nurse Practitioner

## 2021-09-14 DIAGNOSIS — M150 Primary generalized (osteo)arthritis: Secondary | ICD-10-CM

## 2021-09-14 MED ORDER — TRAMADOL HCL 50 MG PO TABS: 50.0000 mg | ORAL_TABLET | Freq: Four times a day (QID) | ORAL | 0 refills | Status: DC | PRN

## 2021-09-14 NOTE — Telephone Encounter (Signed)
done

## 2021-09-21 ENCOUNTER — Ambulatory Visit (INDEPENDENT_AMBULATORY_CARE_PROVIDER_SITE_OTHER): Payer: Medicare HMO

## 2021-09-21 ENCOUNTER — Other Ambulatory Visit: Payer: Self-pay

## 2021-09-21 DIAGNOSIS — Z7901 Long term (current) use of anticoagulants: Secondary | ICD-10-CM

## 2021-09-21 DIAGNOSIS — I4891 Unspecified atrial fibrillation: Secondary | ICD-10-CM

## 2021-09-21 NOTE — Progress Notes (Signed)
Pt INR 2.2 as per Alyssa no change in medication

## 2021-09-29 ENCOUNTER — Ambulatory Visit: Payer: Medicare HMO | Admitting: Nurse Practitioner

## 2021-10-05 ENCOUNTER — Telehealth: Payer: Self-pay

## 2021-10-05 NOTE — Telephone Encounter (Signed)
Per [name] from [facility] advised that pt is having ONO on 10/05/21 and will get results next day

## 2021-10-06 DIAGNOSIS — R0902 Hypoxemia: Secondary | ICD-10-CM | POA: Diagnosis not present

## 2021-10-09 NOTE — Progress Notes (Deleted)
Patient ID: [name], female    DOB: 22-Nov-1940, 80 y.o.   MRN: [id number]  HPI  [name] is a 80 y/o female with a history of DM, hyperlipidemia, HTN, CKD, GERD, atrial fibrillation, anemia, pulmonary fibrosis, previous tobaccco use and chronic heart failure.   Echo report from 05/26/21 reviewed and showed an EF of 55% along with mild LVH. Echo report from 12/24/20 reviewed and showed an EF of 60-65% along with mild LAE and mild AR/AS.   Admitted 06/28/21 due to worsening shortness of breath. Initially given IV lasix with transition to oral diuretics. Lopressor stopped due to bradycardia. Discharged after 2 days. Admitted 05/24/21 due to acute on chronic HF. Cardiology consult obtained.  Initially given IV lasix and then changed to oral diuretics. Had abdominal pain but abdominal/pelvic CT was normal. Diuretic changed to QOD due to renal function. Discharged after 6 days.   She presents today for a follow-up visit with a chief complaint of   Past Medical History:  Diagnosis Date   Anemia    Atrial fibrillation ([place])    Chronic kidney disease    Congestive heart failure (CHF) ([place])    Diabetes mellitus without complication (HCC)    GERD (gastroesophageal reflux disease)    Hyperlipidemia    Hypertension    Pneumonia    Pulmonary fibrosis (HCC)    Past Surgical History:  Procedure Laterality Date   ABDOMINAL HYSTERECTOMY     APPENDECTOMY     CATARACT EXTRACTION     CHOLECYSTECTOMY     HERNIA REPAIR     right knee replacement     right nephroectomy     Family History  Problem Relation Age of Onset   Diabetes Mother    Breast cancer Mother    Heart disease Father    Diabetes Father    Diabetes Sister    Diabetes Brother    Cancer Brother    Social History   Tobacco Use   Smoking status: Former    Packs/day: 1.00    Types: Cigarettes    Quit date: 02/10/2021    Years since quitting: 0.6   Smokeless tobacco: Never  Substance Use Topics   Alcohol use: No   No Known  Allergies     Review of Systems  Constitutional:  Positive for fatigue. Negative for appetite change.  HENT:  Negative for congestion, postnasal drip and sore throat.   Eyes: Negative.   Respiratory:  Positive for cough. Negative for chest tightness, shortness of breath and wheezing.   Cardiovascular:  Negative for chest pain, palpitations and leg swelling.  Gastrointestinal:  Negative for abdominal distention and abdominal pain.  Endocrine: Negative.   Genitourinary: Negative.   Musculoskeletal:  Positive for back pain. Negative for neck pain.  Skin: Negative.   Allergic/Immunologic: Negative.   Neurological:  Negative for dizziness and light-headedness.  Hematological:  Negative for adenopathy. Bruises/bleeds easily.  Psychiatric/Behavioral:  Positive for confusion and sleep disturbance (trouble falling alseep; sleeping on 2 pillows). Negative for dysphoric mood. The patient is not nervous/anxious.       Physical Exam Vitals and nursing note reviewed. Exam conducted with a chaperone present (daughter).  Constitutional:      Appearance: Normal appearance.  HENT:     Head: Normocephalic and atraumatic.  Cardiovascular:     Rate and Rhythm: Normal rate. Rhythm irregular.  Pulmonary:     Effort: Pulmonary effort is normal. No respiratory distress.     Breath sounds: No wheezing or  rales.  Abdominal:     General: There is no distension.     Palpations: Abdomen is soft.     Tenderness: There is no abdominal tenderness.  Musculoskeletal:        General: No tenderness.     Cervical back: Normal range of motion and neck supple.     Right lower leg: No edema.     Left lower leg: No edema.  Skin:    General: Skin is warm and dry.  Neurological:     General: No focal deficit present.     Mental Status: She is alert and oriented to person, place, and time.  Psychiatric:        Mood and Affect: Mood normal.        Behavior: Behavior normal.        Thought Content: Thought  content normal.   Assessment & Plan:  1: Chronic heart failure with preserved ejection fraction along with strutural changes (LAE)- - NYHA class II - euvolemic today - weighing daily and says that her weight has been stable; reminded to call for an overnight weight gain of > 2 pounds or a weekly weight gain of > 5 pounds - weight 126 pounds from last visit here 3 months ago - not adding salt and her daughter tries to look at food labels for sodium content - BP may not tolerate the use of entresto - renal function will not tolerate SGLT2 - sees cardiology (Agbor-Etang) 08/22/21 - does drink some water but also enjoys drinking pepsi; encouraged her to drink more water and less pepsi - BNP 06/28/21 was 1326.5  2: HTN- - BP - saw PCP Stephanie Coup) 08/30/21 - BMP 08/30/21 reviewed and showed sodium 141, potassium 5.0, creatinine 2.69 and GFR 17  3: DM with CKD stage IV- - A1c 08/30/21 was 6.3% - was supposed to see nephrology ([place]) 06/08/21 but they cancelled and have to call and get it rescheduled  4: Pulmonary fibrosis- - saw pulmonology ([place]) 07/05/21 - palliative care visit done 06/17/21 - quit smoking 02/10/21   5: Dementia- - daughter has noticed that since her donepezil was stopped, patient has been repeating questions over & over that she wasn't doing before - explained that it looks like it was stopped along with her lopressor due to bradycardia - advised daughter to resume the donepezil and to keep up with patient's HR; if it drops to <60, she is call PCP to discuss further   Medication list reviewed.

## 2021-10-10 ENCOUNTER — Telehealth: Payer: Self-pay | Admitting: Family

## 2021-10-10 ENCOUNTER — Ambulatory Visit: Payer: Medicare HMO | Admitting: Family

## 2021-10-10 NOTE — Telephone Encounter (Signed)
Patient did not show for her [facility] appointment on 10/10/21. Will attempt to reschedule.

## 2021-10-11 ENCOUNTER — Ambulatory Visit (INDEPENDENT_AMBULATORY_CARE_PROVIDER_SITE_OTHER): Payer: Medicare HMO

## 2021-10-11 ENCOUNTER — Other Ambulatory Visit: Payer: Self-pay

## 2021-10-11 DIAGNOSIS — I4891 Unspecified atrial fibrillation: Secondary | ICD-10-CM

## 2021-10-11 DIAGNOSIS — Z7901 Long term (current) use of anticoagulants: Secondary | ICD-10-CM

## 2021-10-11 LAB — PROTIME-INR

## 2021-10-11 NOTE — Progress Notes (Signed)
Pt INR 2.0 as per alyssa no change for med continue same med as prescribed

## 2021-10-12 ENCOUNTER — Telehealth: Payer: Self-pay

## 2021-10-12 DIAGNOSIS — R0902 Hypoxemia: Secondary | ICD-10-CM

## 2021-10-12 NOTE — Telephone Encounter (Signed)
Faxed [place] report to [facility].  Report states pt does not qualify for nocturnal oxygen per medicare guidelines

## 2021-10-17 ENCOUNTER — Other Ambulatory Visit: Payer: Self-pay | Admitting: Adult Health

## 2021-10-17 DIAGNOSIS — I4891 Unspecified atrial fibrillation: Secondary | ICD-10-CM

## 2021-10-18 ENCOUNTER — Encounter: Payer: Self-pay | Admitting: Internal Medicine

## 2021-10-27 ENCOUNTER — Telehealth: Payer: Self-pay

## 2021-10-27 NOTE — Telephone Encounter (Signed)
D/c order written to [facility] to d/c oxygen due to pt not qualifying.  Gave original copy of rx to [place] from [facility], she came into office.  Made copy and gave to nimisha to place in file

## 2021-10-31 ENCOUNTER — Ambulatory Visit (INDEPENDENT_AMBULATORY_CARE_PROVIDER_SITE_OTHER): Payer: Medicare HMO

## 2021-10-31 DIAGNOSIS — I4891 Unspecified atrial fibrillation: Secondary | ICD-10-CM

## 2021-10-31 DIAGNOSIS — Z7901 Long term (current) use of anticoagulants: Secondary | ICD-10-CM | POA: Diagnosis not present

## 2021-10-31 NOTE — Progress Notes (Signed)
Pt INR 2.1 as per alyssa continue same med as prescribed no change

## 2021-11-03 ENCOUNTER — Other Ambulatory Visit: Payer: Self-pay

## 2021-11-03 ENCOUNTER — Encounter: Payer: Self-pay | Admitting: Nurse Practitioner

## 2021-11-03 ENCOUNTER — Ambulatory Visit (INDEPENDENT_AMBULATORY_CARE_PROVIDER_SITE_OTHER): Payer: Medicare HMO | Admitting: Nurse Practitioner

## 2021-11-03 VITALS — BP 140/64 | HR 82 | Temp 98.2°F | Resp 16 | Ht <= 58 in | Wt 141.6 lb

## 2021-11-03 DIAGNOSIS — E1165 Type 2 diabetes mellitus with hyperglycemia: Secondary | ICD-10-CM

## 2021-11-03 DIAGNOSIS — I4891 Unspecified atrial fibrillation: Secondary | ICD-10-CM

## 2021-11-03 DIAGNOSIS — K5901 Slow transit constipation: Secondary | ICD-10-CM | POA: Diagnosis not present

## 2021-11-03 DIAGNOSIS — M150 Primary generalized (osteo)arthritis: Secondary | ICD-10-CM | POA: Diagnosis not present

## 2021-11-03 DIAGNOSIS — G2581 Restless legs syndrome: Secondary | ICD-10-CM

## 2021-11-03 DIAGNOSIS — G3184 Mild cognitive impairment, so stated: Secondary | ICD-10-CM

## 2021-11-03 DIAGNOSIS — J8410 Pulmonary fibrosis, unspecified: Secondary | ICD-10-CM | POA: Diagnosis not present

## 2021-11-03 MED ORDER — LINACLOTIDE 145 MCG PO CAPS: 145.0000 ug | ORAL_CAPSULE | Freq: Every day | ORAL | 1 refills | Status: DC

## 2021-11-03 MED ORDER — DONEPEZIL HCL 5 MG PO TABS: 5.0000 mg | ORAL_TABLET | Freq: Every day | ORAL | 1 refills | Status: DC

## 2021-11-03 MED ORDER — WARFARIN SODIUM 4 MG PO TABS: 4.0000 mg | ORAL_TABLET | ORAL | 1 refills | Status: DC

## 2021-11-03 MED ORDER — ROPINIROLE HCL 0.5 MG PO TABS: 0.5000 mg | ORAL_TABLET | Freq: Every evening | ORAL | 0 refills | Status: DC

## 2021-11-03 MED ORDER — TRAMADOL HCL 50 MG PO TABS: 50.0000 mg | ORAL_TABLET | Freq: Four times a day (QID) | ORAL | 1 refills | Status: DC | PRN

## 2021-11-03 NOTE — Progress Notes (Signed)
[facility],  [id number]  Internal MEDICINE  Office Visit Note  Patient Name: [name]  [id number]  [id number]  Date of Service: 11/03/2021  Chief Complaint  Patient presents with   Follow-up   Diabetes   Gastroesophageal Reflux   Hyperlipidemia   Hypertension   Anemia   Medication Refill    HPI [name] presents for a follow up visit for overnight pulse oximetry results and medication refills. Patient passed the overnight pulse oximetry and does not qualify for home oxygen anymore. This information was faxed to the DME provider and they will schedule an appointment to pick up the oxygen unit.  She is in need for refills of some of her medications She has her A1C checked in November and it was 6.3, she is not due until February to have it rechecked. She will also be coming in for an appt in June for her annual wellness visit.     Current Medication: Outpatient Encounter Medications as of 11/03/2021  Medication Sig Note   acetaminophen (TYLENOL) 325 MG tablet Take 650 mg by mouth every 6 (six) hours as needed.    amLODipine (NORVASC) 5 MG tablet Take 1 tablet (5 mg total) by mouth daily.    atorvastatin (LIPITOR) 10 MG tablet TAKE 1 TABLET BY MOUTH ONCE DAILY IN THE EVENING    Blood Pressure Monitor KIT Use three times daily to check blood pressure  DX:  I50.42    citalopram (CELEXA) 20 MG tablet Take 1 tablet (20 mg total) by mouth daily.    ferrous sulfate 325 (65 FE) MG tablet Take 1 tablet (325 mg total) by mouth daily with breakfast.    melatonin 3 MG TABS tablet Take 3 mg by mouth at bedtime as needed (sleep).    pantoprazole (PROTONIX) 40 MG tablet Take 1 tablet by mouth twice daily    Torsemide 40 MG TABS Take 40 mg by mouth daily.    vitamin B-12 1000 MCG tablet Take 1 tablet (1,000 mcg total) by mouth daily. 05/24/2021: Not on pt med list from pharmacy   warfarin (COUMADIN) 2 MG tablet Take 1 tablet (2 mg total) by mouth daily.  Except on wed and sat take 4 mg 05/24/2021: LF 7.13.22 #40 for a 28 day supply   [START ON 11/04/2021] warfarin (COUMADIN) 4 MG tablet Take 1 tablet (4 mg total) by mouth every Monday, Wednesday, and Friday.    [DISCONTINUED] donepezil (ARICEPT) 5 MG tablet Take 5 mg by mouth at bedtime.    [DISCONTINUED] linaclotide (LINZESS) 145 MCG CAPS capsule Take 1 capsule (145 mcg total) by mouth daily before breakfast. 05/24/2021: LF 04/19/21 #90   [DISCONTINUED] rOPINIRole (REQUIP) 0.5 MG tablet Take 1 tablet by mouth in the evening    [DISCONTINUED] traMADol (ULTRAM) 50 MG tablet Take 1 tablet (50 mg total) by mouth every 6 (six) hours as needed for moderate pain or severe pain.    donepezil (ARICEPT) 5 MG tablet Take 1 tablet (5 mg total) by mouth at bedtime.    linaclotide (LINZESS) 145 MCG CAPS capsule Take 1 capsule (145 mcg total) by mouth daily before breakfast.    rOPINIRole (REQUIP) 0.5 MG tablet Take 1 tablet (0.5 mg total) by mouth every evening.    traMADol (ULTRAM) 50 MG tablet Take 1 tablet (50 mg total) by mouth every 6 (six) hours as needed for moderate pain or severe pain.    No facility-administered encounter medications on file as of 11/03/2021.  Surgical History: Past Surgical History:  Procedure Laterality Date   ABDOMINAL HYSTERECTOMY     APPENDECTOMY     CATARACT EXTRACTION     CHOLECYSTECTOMY     HERNIA REPAIR     right knee replacement     right nephroectomy      Medical History: Past Medical History:  Diagnosis Date   Anemia    Atrial fibrillation (HCC)    Chronic kidney disease    Congestive heart failure (CHF) (HCC)    Diabetes mellitus without complication (HCC)    GERD (gastroesophageal reflux disease)    Hyperlipidemia    Hypertension    Pneumonia    Pulmonary fibrosis ([place])     Family History: Family History  Problem Relation Age of Onset   Diabetes Mother    Breast cancer Mother    Heart disease Father    Diabetes Father    Diabetes Sister     Diabetes Brother    Cancer Brother     Social History   Socioeconomic History   Marital status: Widowed    Spouse name: Not on file   Number of children: Not on file   Years of education: Not on file   Highest education level: Not on file  Occupational History   Occupation: retired  Tobacco Use   Smoking status: Former    Packs/day: 1.00    Types: Cigarettes    Quit date: 02/10/2021    Years since quitting: 0.7   Smokeless tobacco: Never  Vaping Use   Vaping Use: Never used  Substance and Sexual Activity   Alcohol use: No   Drug use: No   Sexual activity: Not Currently  Other Topics Concern   Not on file  Social History Narrative   Lives with daughter   Social Determinants of Health   Financial Resource Strain: Not on file  Food Insecurity: Not on file  Transportation Needs: Not on file  Physical Activity: Not on file  Stress: Not on file  Social Connections: Not on file  Intimate Partner Violence: Not on file      Review of Systems  Constitutional:  Negative for chills, fatigue and unexpected weight change.  HENT:  Negative for congestion, rhinorrhea, sneezing and sore throat.   Eyes:  Negative for redness.  Respiratory:  Negative for cough, chest tightness and shortness of breath.   Cardiovascular:  Negative for chest pain and palpitations.  Gastrointestinal:  Negative for abdominal pain, constipation, diarrhea, nausea and vomiting.  Genitourinary:  Negative for dysuria and frequency.  Musculoskeletal:  Negative for arthralgias, back pain, joint swelling and neck pain.  Skin:  Negative for rash.  Neurological: Negative.  Negative for tremors and numbness.  Hematological:  Negative for adenopathy. Does not bruise/bleed easily.  Psychiatric/Behavioral:  Negative for behavioral problems (Depression), sleep disturbance and suicidal ideas. The patient is not nervous/anxious.    Vital Signs: BP 140/64    Pulse 82    Temp 98.2 F (36.8 C)    Resp 16    Ht _0   (1.473 m)    Wt 141 lb 9.6 oz (64.2 kg)    SpO2 98%    BMI 29.59 kg/m    Physical Exam Vitals reviewed.  Constitutional:      General: She is not in acute distress.    Appearance: Normal appearance. She is normal weight. She is not ill-appearing.  HENT:     Head: Normocephalic and atraumatic.  Eyes:     Pupils: Pupils are  equal, round, and reactive to light.  Cardiovascular:     Rate and Rhythm: Normal rate and regular rhythm.  Pulmonary:     Effort: Pulmonary effort is normal. No respiratory distress.  Neurological:     Mental Status: She is alert and oriented to person, place, and time.     Cranial Nerves: No cranial nerve deficit.     Coordination: Coordination normal.     Gait: Gait (uses cane) normal.  Psychiatric:        Mood and Affect: Mood normal.        Behavior: Behavior normal.       Assessment/Plan: 1. Atrial fibrillation, unspecified type ([place]) Dose was last changed on 07/08/21 and her INR has been stable.continue warfarin 4 mg on Monday, Wednesday and Friday and 2 mg on Tuesday, Thursday, Saturday and sundays.  - warfarin (COUMADIN) 4 MG tablet; Take 1 tablet (4 mg total) by mouth every Monday, Wednesday, and Friday.  Dispense: 36 tablet; Refill: 1  2. Mild cognitive impairment Stable, refills ordered - donepezil (ARICEPT) 5 MG tablet; Take 1 tablet (5 mg total) by mouth at bedtime.  Dispense: 90 tablet; Refill: 1  3. Uncontrolled type 2 diabetes mellitus with hyperglycemia ([place]) Follow up in 2 months to repeat A1C  4. Constipation by delayed colonic transit Refills ordered, continue as prescribed.  - linaclotide (LINZESS) 145 MCG CAPS capsule; Take 1 capsule (145 mcg total) by mouth daily before breakfast.  Dispense: 90 capsule; Refill: 1  5. Primary generalized (osteo)arthritis Refills ordered, continue to take prn as prescribed.  - traMADol (ULTRAM) 50 MG tablet; Take 1 tablet (50 mg total) by mouth every 6 (six) hours as needed for moderate pain or  severe pain.  Dispense: 60 tablet; Refill: 1  6. Restless leg Stable on current dose, refills ordered. - rOPINIRole (REQUIP) 0.5 MG tablet; Take 1 tablet (0.5 mg total) by mouth every evening.  Dispense: 90 tablet; Refill: 0   General Counseling: Yovana verbalizes understanding of the findings of todays visit and agrees with plan of treatment. I have discussed any further diagnostic evaluation that may be needed or ordered today. We also reviewed her medications today. she has been encouraged to call the office with any questions or concerns that should arise related to todays visit.    No orders of the defined types were placed in this encounter.   Meds ordered this encounter  Medications   linaclotide (LINZESS) 145 MCG CAPS capsule    Sig: Take 1 capsule (145 mcg total) by mouth daily before breakfast.    Dispense:  90 capsule    Refill:  1   rOPINIRole (REQUIP) 0.5 MG tablet    Sig: Take 1 tablet (0.5 mg total) by mouth every evening.    Dispense:  90 tablet    Refill:  0   donepezil (ARICEPT) 5 MG tablet    Sig: Take 1 tablet (5 mg total) by mouth at bedtime.    Dispense:  90 tablet    Refill:  1   warfarin (COUMADIN) 4 MG tablet    Sig: Take 1 tablet (4 mg total) by mouth every Monday, Wednesday, and Friday.    Dispense:  36 tablet    Refill:  1   traMADol (ULTRAM) 50 MG tablet    Sig: Take 1 tablet (50 mg total) by mouth every 6 (six) hours as needed for moderate pain or severe pain.    Dispense:  60 tablet    Refill:  1  Return in about 2 months (around 01/01/2022) for F/U, Recheck A1C, Lekha Dancer PCP.   Total time spent:30 Minutes Time spent includes review of chart, medications, test results, and follow up plan with the patient.   [place] Controlled Substance Database was reviewed by me.  This patient was seen by Jonetta Osgood, FNP-C in collaboration with Dr. Clayborn Bigness as a part of collaborative care agreement.   Ryen Rhames R. Valetta Fuller, MSN, FNP-C Internal medicine

## 2021-11-18 ENCOUNTER — Ambulatory Visit (INDEPENDENT_AMBULATORY_CARE_PROVIDER_SITE_OTHER): Payer: Medicare HMO

## 2021-11-18 DIAGNOSIS — I4891 Unspecified atrial fibrillation: Secondary | ICD-10-CM

## 2021-11-18 NOTE — Progress Notes (Signed)
Pt's home INR was 2.1, per Alyssa no changes with her warfarin, we will repeat in 1 week

## 2021-11-25 ENCOUNTER — Telehealth: Payer: Self-pay | Admitting: Internal Medicine

## 2021-11-25 NOTE — Chronic Care Management (AMB) (Signed)
°  Chronic Care Management   Outreach Note  11/25/2021 Name: [name] MRN: [id number] DOB: 06/09/1941  Referred by: Lavera Guise, MD Reason for referral : No chief complaint on file.   Third unsuccessful telephone outreach was attempted today. The patient was referred to the pharmacist for assistance with care management and care coordination.   Follow Up Plan:   [name] Upstream Scheduler

## 2021-11-30 ENCOUNTER — Emergency Department: Payer: Medicare HMO

## 2021-11-30 ENCOUNTER — Observation Stay
Admission: EM | Admit: 2021-11-30 | Discharge: 2021-12-02 | Disposition: A | Discharge status: Home / Self Care | Payer: Medicare HMO | Attending: Internal Medicine | Admitting: Internal Medicine

## 2021-11-30 ENCOUNTER — Encounter: Payer: Self-pay | Admitting: Emergency Medicine

## 2021-11-30 ENCOUNTER — Other Ambulatory Visit: Payer: Self-pay

## 2021-11-30 DIAGNOSIS — M25512 Pain in left shoulder: Secondary | ICD-10-CM | POA: Diagnosis not present

## 2021-11-30 DIAGNOSIS — Z96651 Presence of right artificial knee joint: Secondary | ICD-10-CM | POA: Diagnosis not present

## 2021-11-30 DIAGNOSIS — N184 Chronic kidney disease, stage 4 (severe): Secondary | ICD-10-CM | POA: Diagnosis not present

## 2021-11-30 DIAGNOSIS — E1165 Type 2 diabetes mellitus with hyperglycemia: Secondary | ICD-10-CM | POA: Diagnosis not present

## 2021-11-30 DIAGNOSIS — Z7901 Long term (current) use of anticoagulants: Secondary | ICD-10-CM | POA: Insufficient documentation

## 2021-11-30 DIAGNOSIS — S199XXA Unspecified injury of neck, initial encounter: Secondary | ICD-10-CM | POA: Diagnosis not present

## 2021-11-30 DIAGNOSIS — I672 Cerebral atherosclerosis: Secondary | ICD-10-CM | POA: Insufficient documentation

## 2021-11-30 DIAGNOSIS — E1122 Type 2 diabetes mellitus with diabetic chronic kidney disease: Secondary | ICD-10-CM | POA: Insufficient documentation

## 2021-11-30 DIAGNOSIS — R739 Hyperglycemia, unspecified: Secondary | ICD-10-CM

## 2021-11-30 DIAGNOSIS — I480 Paroxysmal atrial fibrillation: Secondary | ICD-10-CM | POA: Diagnosis not present

## 2021-11-30 DIAGNOSIS — Z87891 Personal history of nicotine dependence: Secondary | ICD-10-CM | POA: Insufficient documentation

## 2021-11-30 DIAGNOSIS — I10 Essential (primary) hypertension: Secondary | ICD-10-CM | POA: Diagnosis not present

## 2021-11-30 DIAGNOSIS — Y9301 Activity, walking, marching and hiking: Secondary | ICD-10-CM | POA: Diagnosis not present

## 2021-11-30 DIAGNOSIS — S42202A Unspecified fracture of upper end of left humerus, initial encounter for closed fracture: Secondary | ICD-10-CM | POA: Diagnosis not present

## 2021-11-30 DIAGNOSIS — W19XXXA Unspecified fall, initial encounter: Secondary | ICD-10-CM | POA: Diagnosis not present

## 2021-11-30 DIAGNOSIS — I130 Hypertensive heart and chronic kidney disease with heart failure and stage 1 through stage 4 chronic kidney disease, or unspecified chronic kidney disease: Secondary | ICD-10-CM | POA: Diagnosis not present

## 2021-11-30 DIAGNOSIS — I5032 Chronic diastolic (congestive) heart failure: Secondary | ICD-10-CM | POA: Diagnosis not present

## 2021-11-30 DIAGNOSIS — S0990XA Unspecified injury of head, initial encounter: Secondary | ICD-10-CM | POA: Insufficient documentation

## 2021-11-30 DIAGNOSIS — I4891 Unspecified atrial fibrillation: Secondary | ICD-10-CM | POA: Diagnosis not present

## 2021-11-30 DIAGNOSIS — Z79899 Other long term (current) drug therapy: Secondary | ICD-10-CM | POA: Insufficient documentation

## 2021-11-30 DIAGNOSIS — Z20822 Contact with and (suspected) exposure to covid-19: Secondary | ICD-10-CM | POA: Diagnosis not present

## 2021-11-30 DIAGNOSIS — S4992XA Unspecified injury of left shoulder and upper arm, initial encounter: Secondary | ICD-10-CM | POA: Diagnosis present

## 2021-11-30 DIAGNOSIS — E871 Hypo-osmolality and hyponatremia: Secondary | ICD-10-CM | POA: Diagnosis not present

## 2021-11-30 DIAGNOSIS — W010XXA Fall on same level from slipping, tripping and stumbling without subsequent striking against object, initial encounter: Secondary | ICD-10-CM | POA: Diagnosis not present

## 2021-11-30 DIAGNOSIS — S42292A Other displaced fracture of upper end of left humerus, initial encounter for closed fracture: Secondary | ICD-10-CM | POA: Diagnosis not present

## 2021-11-30 DIAGNOSIS — S42212A Unspecified displaced fracture of surgical neck of left humerus, initial encounter for closed fracture: Secondary | ICD-10-CM | POA: Diagnosis not present

## 2021-11-30 DIAGNOSIS — E1129 Type 2 diabetes mellitus with other diabetic kidney complication: Secondary | ICD-10-CM | POA: Diagnosis present

## 2021-11-30 DIAGNOSIS — S42302A Unspecified fracture of shaft of humerus, left arm, initial encounter for closed fracture: Secondary | ICD-10-CM

## 2021-11-30 DIAGNOSIS — M150 Primary generalized (osteo)arthritis: Secondary | ICD-10-CM

## 2021-11-30 DIAGNOSIS — S42402A Unspecified fracture of lower end of left humerus, initial encounter for closed fracture: Secondary | ICD-10-CM | POA: Diagnosis not present

## 2021-11-30 LAB — HEMOGLOBIN A1C
Hgb A1c MFr Bld: 13.5 % — ABNORMAL HIGH (ref 4.8–5.6)
Mean Plasma Glucose: 340.75 mg/dL

## 2021-11-30 LAB — BASIC METABOLIC PANEL
Anion gap: 10 (ref 5–15)
BUN: 37 mg/dL — ABNORMAL HIGH (ref 8–23)
CO2: 28 mmol/L (ref 22–32)
Calcium: 8.9 mg/dL (ref 8.9–10.3)
Chloride: 88 mmol/L — ABNORMAL LOW (ref 98–111)
Creatinine, Ser: 2.56 mg/dL — ABNORMAL HIGH (ref 0.44–1.00)
GFR, Estimated: 18 mL/min — ABNORMAL LOW (ref >60)
Glucose, Bld: 527 mg/dL (ref 70–99)
Potassium: 4 mmol/L (ref 3.5–5.1)
Sodium: 126 mmol/L — ABNORMAL LOW (ref 135–145)

## 2021-11-30 LAB — LACTIC ACID, PLASMA
Lactic Acid, Venous: 2.6 mmol/L (ref 0.5–1.9)
Lactic Acid, Venous: 1.2 mmol/L (ref 0.5–1.9)

## 2021-11-30 LAB — CBC WITH DIFFERENTIAL/PLATELET
Abs Immature Granulocytes: 0.16 10*3/uL — ABNORMAL HIGH (ref 0.00–0.07)
Basophils Absolute: 0.1 10*3/uL (ref 0.0–0.1)
Basophils Relative: 1 %
Eosinophils Absolute: 0.1 10*3/uL (ref 0.0–0.5)
Eosinophils Relative: 1 %
HCT: 38.3 % (ref 36.0–46.0)
Hemoglobin: 13 g/dL (ref 12.0–15.0)
Immature Granulocytes: 2 %
Lymphocytes Relative: 17 %
Lymphs Abs: 1.6 10*3/uL (ref 0.7–4.0)
MCH: 29.9 pg (ref 26.0–34.0)
MCHC: 33.9 g/dL (ref 30.0–36.0)
MCV: 88 fL (ref 80.0–100.0)
Monocytes Absolute: 0.5 10*3/uL (ref 0.1–1.0)
Monocytes Relative: 5 %
Neutro Abs: 6.9 10*3/uL (ref 1.7–7.7)
Neutrophils Relative %: 74 %
Platelets: 214 10*3/uL (ref 150–400)
RBC: 4.35 MIL/uL (ref 3.87–5.11)
RDW: 12.3 % (ref 11.5–15.5)
WBC: 9.3 10*3/uL (ref 4.0–10.5)
nRBC: 0 % (ref 0.0–0.2)

## 2021-11-30 LAB — PROTIME-INR
INR: 2.2 — ABNORMAL HIGH (ref 0.8–1.2)
Prothrombin Time: 24.3 seconds — ABNORMAL HIGH (ref 11.4–15.2)

## 2021-11-30 LAB — CBG MONITORING, ED
Glucose-Capillary: 440 mg/dL — ABNORMAL HIGH (ref 70–99)
Glucose-Capillary: 192 mg/dL — ABNORMAL HIGH (ref 70–99)
Glucose-Capillary: 234 mg/dL — ABNORMAL HIGH (ref 70–99)

## 2021-11-30 LAB — TSH: TSH: 2.854 u[IU]/mL (ref 0.350–4.500)

## 2021-11-30 LAB — RESP PANEL BY RT-PCR (FLU A&B, COVID) ARPGX2
Influenza A by PCR: NEGATIVE
Influenza B by PCR: NEGATIVE
SARS Coronavirus 2 by RT PCR: NEGATIVE

## 2021-11-30 LAB — MAGNESIUM: Magnesium: 2.1 mg/dL (ref 1.7–2.4)

## 2021-11-30 MED ORDER — ONDANSETRON HCL 4 MG/2ML IJ SOLN
4.0000 mg | Freq: Four times a day (QID) | INTRAMUSCULAR | Status: DC | PRN
  Administered 2021-12-01: 4 mg via INTRAVENOUS
  Filled 2021-11-30: qty 2

## 2021-11-30 MED ORDER — FENTANYL CITRATE PF 50 MCG/ML IJ SOSY
50.0000 ug | PREFILLED_SYRINGE | Freq: Once | INTRAMUSCULAR | Status: AC
Start: 2021-11-30 — End: 2021-11-30
  Administered 2021-11-30: 50 ug via INTRAVENOUS
  Filled 2021-11-30: qty 1

## 2021-11-30 MED ORDER — INSULIN ASPART 100 UNIT/ML IJ SOLN
0.0000 [IU] | Freq: Every day | INTRAMUSCULAR | Status: DC
  Administered 2021-11-30: 2 [IU] via SUBCUTANEOUS
  Filled 2021-11-30: qty 1

## 2021-11-30 MED ORDER — SODIUM CHLORIDE 0.9 % IV BOLUS: 1000.0000 mL | Freq: Once | INTRAVENOUS | Status: DC

## 2021-11-30 MED ORDER — CHLORHEXIDINE GLUCONATE CLOTH 2 % EX PADS
6.0000 | MEDICATED_PAD | Freq: Every day | CUTANEOUS | Status: DC
  Administered 2021-11-30 – 2021-12-01 (×2): 6 via TOPICAL
  Filled 2021-11-30: qty 6

## 2021-11-30 MED ORDER — INSULIN ASPART 100 UNIT/ML IJ SOLN: 10.0000 [IU] | Freq: Once | INTRAMUSCULAR | Status: DC

## 2021-11-30 MED ORDER — TORSEMIDE 20 MG PO TABS
40.0000 mg | ORAL_TABLET | Freq: Every day | ORAL | Status: DC
  Administered 2021-12-01 – 2021-12-02 (×2): 40 mg via ORAL
  Filled 2021-11-30 (×2): qty 2

## 2021-11-30 MED ORDER — VITAMIN B-12 1000 MCG PO TABS
1000.0000 ug | ORAL_TABLET | Freq: Every day | ORAL | Status: DC
  Administered 2021-12-01 – 2021-12-02 (×2): 1000 ug via ORAL
  Filled 2021-11-30 (×3): qty 1

## 2021-11-30 MED ORDER — INSULIN ASPART 100 UNIT/ML IJ SOLN
10.0000 [IU] | Freq: Once | INTRAMUSCULAR | Status: AC
  Administered 2021-11-30: 10 [IU] via INTRAVENOUS

## 2021-11-30 MED ORDER — FERROUS SULFATE 325 (65 FE) MG PO TABS
325.0000 mg | ORAL_TABLET | Freq: Every day | ORAL | Status: DC
  Administered 2021-12-01 – 2021-12-02 (×2): 325 mg via ORAL
  Filled 2021-11-30 (×2): qty 1

## 2021-11-30 MED ORDER — METOPROLOL TARTRATE 5 MG/5ML IV SOLN: 5.0000 mg | Freq: Four times a day (QID) | INTRAVENOUS | Status: DC | PRN

## 2021-11-30 MED ORDER — WARFARIN - PHARMACIST DOSING INPATIENT
Freq: Every day | Status: DC
  Filled 2021-11-30: qty 1

## 2021-11-30 MED ORDER — ONDANSETRON HCL 4 MG PO TABS: 4.0000 mg | ORAL_TABLET | Freq: Four times a day (QID) | ORAL | Status: DC | PRN

## 2021-11-30 MED ORDER — SODIUM CHLORIDE 0.9 % IV SOLN: INTRAVENOUS | Status: DC

## 2021-11-30 MED ORDER — ACETAMINOPHEN 325 MG PO TABS: 650.0000 mg | ORAL_TABLET | Freq: Four times a day (QID) | ORAL | Status: DC | PRN

## 2021-11-30 MED ORDER — SODIUM CHLORIDE 0.9 % IV BOLUS
1000.0000 mL | Freq: Once | INTRAVENOUS | Status: AC
Start: 2021-11-30 — End: 2021-11-30
  Administered 2021-11-30: 1000 mL via INTRAVENOUS

## 2021-11-30 MED ORDER — MELATONIN 5 MG PO TABS
2.5000 mg | ORAL_TABLET | Freq: Every evening | ORAL | Status: DC | PRN
  Administered 2021-12-01: 2.5 mg via ORAL
  Filled 2021-11-30: qty 1

## 2021-11-30 MED ORDER — SODIUM CHLORIDE 0.9 % IV BOLUS
1000.0000 mL | Freq: Once | INTRAVENOUS | Status: AC
  Administered 2021-11-30: 1000 mL via INTRAVENOUS

## 2021-11-30 MED ORDER — INSULIN ASPART 100 UNIT/ML IJ SOLN
0.0000 [IU] | Freq: Three times a day (TID) | INTRAMUSCULAR | Status: DC
  Administered 2021-11-30 – 2021-12-01 (×2): 5 [IU] via SUBCUTANEOUS
  Administered 2021-12-01: 15 [IU] via SUBCUTANEOUS
  Administered 2021-12-02: 11 [IU] via SUBCUTANEOUS
  Administered 2021-12-02: 8 [IU] via SUBCUTANEOUS
  Filled 2021-11-30 (×5): qty 1

## 2021-11-30 MED ORDER — PANTOPRAZOLE SODIUM 40 MG PO TBEC
40.0000 mg | DELAYED_RELEASE_TABLET | Freq: Two times a day (BID) | ORAL | Status: DC
  Administered 2021-11-30 – 2021-12-02 (×4): 40 mg via ORAL
  Filled 2021-11-30 (×4): qty 1

## 2021-11-30 MED ORDER — METOPROLOL TARTRATE 5 MG/5ML IV SOLN
5.0000 mg | Freq: Once | INTRAVENOUS | Status: AC
Start: 2021-11-30 — End: 2021-11-30
  Administered 2021-11-30: 5 mg via INTRAVENOUS
  Filled 2021-11-30: qty 5

## 2021-11-30 MED ORDER — CITALOPRAM HYDROBROMIDE 10 MG PO TABS
20.0000 mg | ORAL_TABLET | Freq: Every day | ORAL | Status: DC
  Administered 2021-12-01 – 2021-12-02 (×2): 20 mg via ORAL
  Filled 2021-11-30: qty 2
  Filled 2021-11-30: qty 1

## 2021-11-30 MED ORDER — LINACLOTIDE 145 MCG PO CAPS
145.0000 ug | ORAL_CAPSULE | Freq: Every day | ORAL | Status: DC
  Administered 2021-12-01 – 2021-12-02 (×2): 145 ug via ORAL
  Filled 2021-11-30 (×2): qty 1

## 2021-11-30 MED ORDER — AMLODIPINE BESYLATE 5 MG PO TABS
5.0000 mg | ORAL_TABLET | Freq: Every day | ORAL | Status: DC
Start: 2021-11-30 — End: 2021-12-02
  Filled 2021-11-30 (×2): qty 1

## 2021-11-30 MED ORDER — CYCLOBENZAPRINE HCL 10 MG PO TABS
5.0000 mg | ORAL_TABLET | Freq: Once | ORAL | Status: AC
  Administered 2021-11-30: 5 mg via ORAL
  Filled 2021-11-30: qty 1

## 2021-11-30 MED ORDER — TRAMADOL HCL 50 MG PO TABS
50.0000 mg | ORAL_TABLET | Freq: Four times a day (QID) | ORAL | Status: DC | PRN
  Administered 2021-11-30 – 2021-12-02 (×3): 50 mg via ORAL
  Filled 2021-11-30 (×3): qty 1

## 2021-11-30 MED ORDER — ACETAMINOPHEN 500 MG PO TABS
1000.0000 mg | ORAL_TABLET | Freq: Once | ORAL | Status: AC
  Administered 2021-11-30: 1000 mg via ORAL
  Filled 2021-11-30: qty 2

## 2021-11-30 MED ORDER — ROPINIROLE HCL 1 MG PO TABS
0.5000 mg | ORAL_TABLET | Freq: Every evening | ORAL | Status: DC
  Administered 2021-12-01: 0.5 mg via ORAL
  Filled 2021-11-30: qty 1
  Filled 2021-11-30 (×2): qty 2

## 2021-11-30 MED ORDER — WARFARIN SODIUM 4 MG PO TABS
4.0000 mg | ORAL_TABLET | Freq: Once | ORAL | Status: AC
  Administered 2021-11-30: 4 mg via ORAL
  Filled 2021-11-30: qty 1

## 2021-11-30 MED ORDER — FENTANYL CITRATE PF 50 MCG/ML IJ SOSY
50.0000 ug | PREFILLED_SYRINGE | Freq: Once | INTRAMUSCULAR | Status: AC
  Administered 2021-11-30: 50 ug via INTRAVENOUS
  Filled 2021-11-30: qty 1

## 2021-11-30 MED ORDER — DONEPEZIL HCL 5 MG PO TABS
5.0000 mg | ORAL_TABLET | Freq: Every day | ORAL | Status: DC
  Administered 2021-12-01 (×2): 5 mg via ORAL
  Filled 2021-11-30 (×3): qty 1

## 2021-11-30 MED ORDER — ATORVASTATIN CALCIUM 10 MG PO TABS
10.0000 mg | ORAL_TABLET | Freq: Every evening | ORAL | Status: DC
Start: 2021-11-30 — End: 2021-12-02
  Administered 2021-12-01: 10 mg via ORAL
  Filled 2021-11-30: qty 1

## 2021-11-30 MED ORDER — INSULIN ASPART 100 UNIT/ML IJ SOLN
0.0000 [IU] | INTRAMUSCULAR | Status: DC
  Filled 2021-11-30: qty 1

## 2021-11-30 NOTE — Consult Note (Signed)
ANTICOAGULATION CONSULT NOTE - Follow Up Consult  Pharmacy Consult for warfarin Indication: atrial fibrillation  No Known Allergies  Patient Measurements: Height: 4\' 10"  (147.3 cm) Weight: 62.1 kg (137 lb) IBW/kg (Calculated) : 40.9  Vital Signs: Temp: 97.9 F (36.6 C) (02/08 0936) Temp Source: Oral (02/08 0936) BP: 112/58 (02/08 1230) Pulse Rate: 85 (02/08 1230)  Labs: Recent Labs    11/30/21 1027  HGB 13.0  HCT 38.3  PLT 214  LABPROT 24.3*  INR 2.2*  CREATININE 2.56*    Estimated Creatinine Clearance: 13.7 mL/min (A) (by C-G formula based on SCr of 2.56 mg/dL (H)).   Medications:  Warfarin 4 mg on Wednesdays and Saturdays, 2 mg all other days   Assessment: 81 y.o. female with medical history significant of paroxysmal atrial fibrillation on warfarin, CKD , CHF, T2DM, HTN, HLD and pulmonary fibrosis who was brought into the ED after a fall. Pharmacy has been consulted for warfarin dosing.  Date INR Warfarin dose ordered  2/8 2.2 4 mg        Goal of Therapy:  INR 2-3 Monitor platelets by anticoagulation protocol: Yes   Plan:   INR therapeutic    Will give home warfarin dose of 4 mg tonight  Monitor daily INR, CBC, s/s of bleed   Darnelle Bos, PharmD 11/30/2021,1:34 PM

## 2021-11-30 NOTE — ED Notes (Addendum)
Dr. Doristine Bosworth notified of pt's v/s. Pt only reports some leg cramps otherwise, pt denies symptoms of CP, ShOB, or lightheadedness. See new orders.

## 2021-11-30 NOTE — ED Notes (Signed)
Patient to be moved to room 7. Report received from Quita Skye, [place]

## 2021-11-30 NOTE — Evaluation (Signed)
Occupational Therapy Evaluation Patient Details Name: [name] MRN: [id number] DOB: 31-Jul-1941 Today's Date: 11/30/2021   History of Present Illness 81 y.o. female with medical history significant of paroxysmal atrial fibrillation, CKD stage IV, diastolic congestive heart failure, type 2 diabetes mellitus, hypertension, hyperlipidemia and pulmonary fibrosis who was brought into the ED after a fall.  History provided by the patient, supported by the daughter at the bedside.  Regarding the history, patient uses walker and cane with walking.  While she was walking using her walker today in the house, she lost her balance and fell on the left side of the body and immediately started hurting in the left shoulder. Pt with L humeral neck fx s/p mechanical fall.   Clinical Impression   Patient presenting with decreased Ind in self care, balance, functional mobility/transfers, endurance, and safety awareness. [name] present who confirms baseline. Pt lives with daughter and uses SPC and RW (mostly [facility]) for functional mobility and self care tasks. Pt performs ADLs at mod I level and participates in adult day program 5x/wk. Pt has several family members at home to assist as needed. OT reviewed current precautions with L shoulder fx and sling use. Pt performed supine >sit with min A for trunk support to EOB. Pt reports increased spasms in B LEs. Sling adjusted while seated EOB. Pt remains asymptomatic with BP results of 66/38 supine. Pt returned to supine and BP re-checked after ~ 3 minutes of 94/42. Pt limited this session secondary to increased pain and hypotension.Pt's daughter , who is present, reports assisting pt into car and to ED and would like mother to return home if she is able. Patient will benefit from acute OT to increase overall independence in the areas of ADLs, functional mobility, and safety awareness in order to safely discharge home with family.      Recommendations for follow up  therapy are one component of a multi-disciplinary discharge planning process, led by the attending physician.  Recommendations may be updated based on patient status, additional functional criteria and insurance authorization.   Follow Up Recommendations  Home health OT    Assistance Recommended at Discharge Frequent or constant Supervision/Assistance  Patient can return home with the following A little help with walking and/or transfers;A little help with bathing/dressing/bathroom;Assistance with cooking/housework;Assist for transportation;Help with stairs or ramp for entrance    Functional Status Assessment  Patient has had a recent decline in their functional status and demonstrates the ability to make significant improvements in function in a reasonable and predictable amount of time.  Equipment Recommendations  BSC/3in1;Tub/shower seat    Recommendations for Other Services       Precautions / Restrictions Precautions Precautions: Fall Required Braces or Orthoses: Sling Restrictions Other Position/Activity Restrictions: conservative tx to L fumeral fx with sling to be donned and NWB      Mobility Bed Mobility Overal bed mobility: Needs Assistance Bed Mobility: Supine to Sit, Sit to Supine     Supine to sit: Min assist Sit to supine: Mod assist        Transfers                   General transfer comment: deferred secondary to hypotension      Balance Overall balance assessment: Needs assistance Sitting-balance support: Feet unsupported, Single extremity supported Sitting balance-Leahy Scale: Fair Sitting balance - Comments: seated on ED stretcher bed with S- min guard       Standing balance comment: deferred for safety  ADL either performed or assessed with clinical judgement   ADL Overall ADL's : Needs assistance/impaired                                       General ADL Comments: OT anticipates  min A for UB self care and min - mod A for LB self care. Pt greatly limited by pain and hypotension this session.     Vision Baseline Vision/History: 2 Legally blind Patient Visual Report: No change from baseline              Pertinent Vitals/Pain Pain Assessment Pain Assessment: Faces Faces Pain Scale: Hurts whole lot Pain Location: B LEs and L shoulder Pain Descriptors / Indicators: Spasm, Aching Pain Intervention(s): Limited activity within patient's tolerance, Monitored during session, Repositioned, Other (comment) (RN notified)     Hand Dominance Right   Extremity/Trunk Assessment Upper Extremity Assessment Upper Extremity Assessment: Generalized weakness LUE Deficits / Details: not formally tested secondary to fx   Lower Extremity Assessment Lower Extremity Assessment: Defer to PT evaluation       Communication Communication Communication: HOH   Cognition Arousal/Alertness: Awake/alert Behavior During Therapy: WFL for tasks assessed/performed Overall Cognitive Status: Within Functional Limits for tasks assessed                                                  Home Living Family/patient expects to be discharged to:: Private residence Living Arrangements: Children Available Help at Discharge: Family;Available 24 hours/day Type of Home: Mobile home Home Access: Ramped entrance     Home Layout: One level     Bathroom Shower/Tub: Occupational psychologist: Standard     Home Equipment: Cane - single Barista (2 wheels)   Additional Comments: reports she uses SPC primarily      Prior Functioning/Environment Prior Level of Function : Independent/Modified Independent             Mobility Comments: Use of SPC and RW at baseline for functional mobility. ADLs Comments: Pt goes to adult day program from 8-5 and M - F. Daughter reports pt performs ADLs at mod I level with assistance from family for IADLs. Someone is  always at home.        OT Problem List: Decreased strength;Decreased activity tolerance;Impaired balance (sitting and/or standing);Decreased safety awareness;Pain;Decreased knowledge of precautions;Decreased knowledge of use of DME or AE      OT Treatment/Interventions: Self-care/ADL training;Therapeutic exercise;Energy conservation;DME and/or AE instruction;Cognitive remediation/compensation;Therapeutic activities;Balance training;Patient/family education;Modalities;Manual therapy;Visual/perceptual remediation/compensation    OT Goals(Current goals can be found in the care plan section) Acute Rehab OT Goals Patient Stated Goal: to go home OT Goal Formulation: With patient/family Time For Goal Achievement: 12/14/21 Potential to Achieve Goals: Good ADL Goals Pt Will Perform Grooming: with supervision;standing Pt Will Perform Upper Body Dressing: sitting;with supervision Pt Will Perform Lower Body Dressing: with supervision;sit to/from stand Pt Will Transfer to Toilet: with supervision;ambulating Pt Will Perform Toileting - Clothing Manipulation and hygiene: with supervision;sit to/from stand  OT Frequency: Min 2X/week       AM-PAC OT "6 Clicks" Daily Activity     Outcome Measure Help from another person eating meals?: A Little Help from another person taking care of personal grooming?: A Little Help from  another person toileting, which includes using toliet, bedpan, or urinal?: A Lot Help from another person bathing (including washing, rinsing, drying)?: A Lot Help from another person to put on and taking off regular upper body clothing?: A Little Help from another person to put on and taking off regular lower body clothing?: A Lot 6 Click Score: 15   End of Session Nurse Communication: Mobility status;Other (comment) (hypotension)  Activity Tolerance: Patient limited by pain Patient left: in bed;with call bell/phone within reach;with nursing/sitter in room;with family/visitor  present  OT Visit Diagnosis: Unsteadiness on feet (R26.81);Muscle weakness (generalized) (M62.81);History of falling (Z91.81)                Time: 6811-5726 OT Time Calculation (min): 18 min Charges:  OT General Charges $OT Visit: 1 Visit OT Evaluation $OT Eval Moderate Complexity: 1 Mod OT Treatments $Therapeutic Activity: 8-22 mins  Darleen Crocker, MS, OTR/L , CBIS ascom [phone]  11/30/21, 2:02 PM

## 2021-11-30 NOTE — ED Triage Notes (Signed)
Pt comes into the ED via POV c/o mechanical fall this morning where the patient states she lost her balance and fell landing on the left shoulder.  No obvious deformity noted to the shoulder currently.  Pt denies hitting her head or any LOC.  Pt in NAD.

## 2021-11-30 NOTE — ED Notes (Signed)
Pt noted to not have any urine output since admission. Pt assessed and noted a palpable distended bladder and pt reports suprapubic discomfort. Bladder scanner shows >630 mL. Pahwani, MD notified.

## 2021-11-30 NOTE — Progress Notes (Signed)
PT Cancellation Note  Patient Details Name: [name] MRN: [id number] DOB: [birth year]   Cancelled Treatment:    Reason Eval/Treat Not Completed: Medical issues which prohibited therapy: Nursing requested PT hold this date secondary to BP concerns. Nursing also stated original plan for pt to transfer to the unit has changed to now pt transferring to stepdown.  Per protocol will complete PT orders at this time but will reassess pt pending a change in status upon receipt of new PT orders.    Linus Salmons PT, DPT 11/30/21, 4:42 PM

## 2021-11-30 NOTE — ED Notes (Signed)
Pahwani notified that pt with crackles in lung base and SpO2 94%. Fluid bolus held and awaiting for further orders.

## 2021-11-30 NOTE — ED Provider Notes (Signed)
[facility] Provider Note    Event Date/Time   First MD Initiated Contact with Patient 11/30/21 [phone]     (approximate)   History   Fall   HPI  [name] is a 81 y.o. female with past medical history of HTN, HDL, anemia, diabetes, GERD, A-fib on Coumadin who presents for evaluation of left shoulder pain and some discomfort of the left elbow after a fall that occurred earlier today.  Patient states that using her walker when she lost her balance falling to the left shoulder.  She states she does not think she hit her head or had LOC but that she is on Coumadin for A-fib.  She denies any pain in the right upper extremity, back, chest, abdomen or lower extremities.  No other recent falls or injuries.  She denies any other sick symptoms including fevers, chills, cough, nausea, vomiting, diarrhea, rash or any other acute concerns at this time.  She is not currently on any rate control such as metoprolol or Cardizem.      Physical Exam  Triage Vital Signs: ED Triage Vitals  Enc Vitals Group     BP 11/30/21 0936 (!) 109/45     Pulse Rate 11/30/21 0936 97     Resp 11/30/21 0936 16     Temp 11/30/21 0936 97.9 F (36.6 C)     Temp Source 11/30/21 0936 Oral     SpO2 11/30/21 0936 96 %     Weight 11/30/21 0936 137 lb (62.1 kg)     Height 11/30/21 0936 4\' 10"  (1.473 m)     Head Circumference --      Peak Flow --      Pain Score 11/30/21 0946 10     Pain Loc --      Pain Edu? --      Excl. in [place]? --     Most recent vital signs: Vitals:   11/30/21 1130 11/30/21 1200  BP: 115/67 130/67  Pulse: (!) 113 (!) 104  Resp: 17 16  Temp:    SpO2: 99% 98%    General: Awake, appears uncomfortable CV:  Good peripheral perfusion.  2+ radial pulses.  No murmurs Resp:  Normal effort.  Clear bilaterally. Abd:  No distention.  Throughout. Other:  Patient seems to have an effusion and some edema and tenderness over the anterior aspect of the left shoulder with  significant weakness and decreased range of motion.  There is a skin tear over the posterior aspect of the left elbow patient is very hesitant to move this at all but seems to have pain radiating from the shoulder.  She does not have pain with passive range of motion of the left elbow.  She has no pain over the forearm or wrist and sensation is intact in the distribution of the radial ulnar and median nerves.  No snuffbox tenderness.  Patient has full strength range of motion and no evidence of trauma on exam of the right upper extremity and bilateral lower extremities.  Cranial nerves II through XII are grossly intact.  No obvious trauma to the face Head or neck.  No tenderness over the C/T/L-spine.   ED Results / Procedures / Treatments  Labs (all labs ordered are listed, but only abnormal results are displayed) Labs Reviewed  CBC WITH DIFFERENTIAL/PLATELET - Abnormal; Notable for the following components:      Result Value   Abs Immature Granulocytes 0.16 (*)    All other components  within normal limits  BASIC METABOLIC PANEL - Abnormal; Notable for the following components:   Sodium 126 (*)    Chloride 88 (*)    Glucose, Bld 527 (*)    BUN 37 (*)    Creatinine, Ser 2.56 (*)    GFR, Estimated 18 (*)    All other components within normal limits  PROTIME-INR - Abnormal; Notable for the following components:   Prothrombin Time 24.3 (*)    INR 2.2 (*)    All other components within normal limits  RESP PANEL BY RT-PCR (FLU A&B, COVID) ARPGX2  MAGNESIUM  TSH  HEMOGLOBIN A1C     EKG  ECG obtained shows A-fib with RVR with a ventricular rate of 121, QTc of 508 without appearance of acute ischemia or other significant arrhythmia.   RADIOLOGY  CT head and C-spine ordered interpreted myself show no evidence of skull fracture or intracranial hemorrhage ischemia mass effect or edema.  No evidence of acute C-spine injury.  I also reviewed radiology's findings and agree with the  interpretation.  Plain film the left elbow shows no acute fracture dislocation or effusion.  This was interpreted by myself and I also reviewed radiologist interpretation and agree with the findings of same.  Plain films of the left shoulder shows an impacted proximal humerus fracture.  As reviewed radiology interpretation to make note of probable hemarthrosis and widening of the left AC joint.  PROCEDURES:  Critical Care performed: No  .1-3 Lead EKG Interpretation Performed by: Lucrezia Starch, MD Authorized by: Lucrezia Starch, MD     Interpretation: non-specific     ECG rate assessment: tachycardic     Rhythm: atrial fibrillation     Ectopy: none    The patient is on the cardiac monitor to evaluate for evidence of arrhythmia and/or significant heart rate changes.   MEDICATIONS ORDERED IN ED: Medications  insulin aspart (novoLOG) injection 0-9 Units (has no administration in time range)  insulin aspart (novoLOG) injection 10 Units (has no administration in time range)  fentaNYL (SUBLIMAZE) injection 50 mcg (50 mcg Intravenous Given 11/30/21 1037)  sodium chloride 0.9 % bolus 1,000 mL (1,000 mLs Intravenous New Bag/Given 11/30/21 1127)  metoprolol tartrate (LOPRESSOR) injection 5 mg (5 mg Intravenous Given 11/30/21 1222)  fentaNYL (SUBLIMAZE) injection 50 mcg (50 mcg Intravenous Given 11/30/21 1221)  acetaminophen (TYLENOL) tablet 1,000 mg (1,000 mg Oral Given 11/30/21 1219)     IMPRESSION / MDM / ASSESSMENT AND PLAN / ED COURSE  I reviewed the triage vital signs and the nursing notes.                              Differential diagnosis includes, but is not limited to possible fracture dislocation left elbow older with possible occult fracture of the left elbow and evidence of skin tear on exam.  She is otherwise neurovascular intact in left upper extremity and there is no other obvious evidence of trauma on exam.  However on arrival patient's BP is borderline low with a MAP of 66 and  given her age and the fact she is anticoagulated I will obtain a CT head as well as check a CBC and INR and EKG.  She denies any preceding symptoms for losing her balance suggest presyncope does not have any focal deficits to suggest stroke I suspect mechanical fall from some baseline instability.  ECG obtained shows A-fib with RVR with a ventricular rate of 121,  QTc of 508 without appearance of acute ischemia or other significant arrhythmia.  CT head and C-spine ordered interpreted myself show no evidence of skull fracture or intracranial hemorrhage ischemia mass effect or edema.  No evidence of acute C-spine injury.  I also reviewed radiology's findings and agree with the interpretation.  Plain film the left elbow shows no acute fracture dislocation or effusion.  This was interpreted by myself and I also reviewed radiologist interpretation and agree with the findings of same.  Plain films of the left shoulder shows an impacted proximal humerus fracture.  As reviewed radiology interpretation to make note of probable hemarthrosis and widening of the left AC joint.  BMP is remarkable for a sodium of 126 protecting some pseudohyponatremia with a glucose of 527.  Creatinine is close to baseline at 2.56 compared to 2.693 months ago.  Patient's bicarb is 28 and anion gap is 10 not suggestive of DKA.  INR is therapeutic at 2.2.  CBC without leukocytosis or acute anemia.  Magnesium and TSH are within normal limits.  Discussed left proximal humerus fracture with on-call orthopedist Dr. Mack Guise who recommended placing patient in a sling and pain control and PT OT and no indication for emergent surgery at this time.  With regard to patient's A-fib with RVR possibly precipitated by pain in the setting of an acute injury.  She responded well to 1 time 5 mg dose of metoprolol with subsequent rates noted to be 10 3-1 07 range on telemetry.  Regarding hyperglycemia seems on review of records he has a history of  diabetes but is not currently on any medication for this.  Will hydrate with some IV fluids and give a dose of insulin.  Given RVR and fairly high blood sugars as well as necessity of pain control PT OT think patient would benefit from admission and I discussed this with admitting hospitalist who is in agreement with this plan.  Patient be admitted to medicine service for further evaluation and management.      FINAL CLINICAL IMPRESSION(S) / ED DIAGNOSES   Final diagnoses:  Closed fracture of left upper extremity, initial encounter  Hyperglycemia  Atrial fibrillation with RVR ([place])  Fall, initial encounter     Rx / DC Orders   ED Discharge Orders     None        Note:  This document was prepared using Dragon voice recognition software and may include unintentional dictation errors.   Lucrezia Starch, MD 11/30/21 1226

## 2021-11-30 NOTE — ED Notes (Signed)
Pt states that she was sitting on the porch this am and went to get up to go inside and got up to fast and lost her balance and fell landing on her left shoulder, pt has a skin tear to her left elbow, pt denies hitting her head, denies any head, neck, hip, back or leg pain. Pt is tender to the left shoulder area, pt has a strong radial pulse and good sensation to the left hand and wrist

## 2021-11-30 NOTE — H&P (Signed)
History and Physical    [name]:[id number] DOB: 1941-10-20 DOA: 11/30/2021  PCP: Lavera Guise, MD  Patient coming from: Home  I have personally briefly reviewed patient's old medical records in [place]  Chief Complaint: Fall  HPI: [name] is a 81 y.o. female with medical history significant of paroxysmal atrial fibrillation, CKD stage IV, diastolic congestive heart failure, type 2 diabetes mellitus, hypertension, hyperlipidemia and pulmonary fibrosis who was brought into the ED after a fall.  History provided by the patient, supported by the daughter at the bedside.  Regarding the history, patient uses walker and cane with walking.  While she was walking using her walker today in the house, she lost her balance and fell on the left side of the body and immediately started hurting in the left shoulder.  Her granddaughter was at home so she heard the voice and she was quickly attended and later on was brought into the emergency department.  Patient denies any history of chest pain, shortness of breath, dizziness, nausea, vomiting or any other complaint prior to, during or after the episode.  ED Course: Upon arrival to ED, patient was slightly tachycardic, blood pressure was fairly within normal range.  Multiple imaging studies including CT head, left elbow x-ray and CT cervical spine were done which were negative for any acute pathology however left shoulder x-ray showed impacted left middle neck fracture with probable hemarthrosis.  ED physician discussed the case with orthopedics on-call and they recommended sling and neurosurgery and follow-up as outpatient.  Patient was also found to have an atrial fibrillation with RVR, she was given 1 dose of IV Lopressor, her heart rate is under 100 now.  She was found to have significant hyperglycemia with blood sugar of 529.  Hospitalist were called for admission.  COVID is pending.  Review of Systems: As per HPI otherwise negative.     Past Medical History:  Diagnosis Date   Anemia    Atrial fibrillation (HCC)    Chronic kidney disease    Congestive heart failure (CHF) (HCC)    Diabetes mellitus without complication (HCC)    GERD (gastroesophageal reflux disease)    Hyperlipidemia    Hypertension    Pneumonia    Pulmonary fibrosis ([place])     Past Surgical History:  Procedure Laterality Date   ABDOMINAL HYSTERECTOMY     APPENDECTOMY     CATARACT EXTRACTION     CHOLECYSTECTOMY     HERNIA REPAIR     right knee replacement     right nephroectomy       reports that she quit smoking about 9 months ago. Her smoking use included cigarettes. She smoked an average of 1 pack per day. She has never used smokeless tobacco. She reports that she does not drink alcohol and does not use drugs.  No Known Allergies  Family History  Problem Relation Age of Onset   Diabetes Mother    Breast cancer Mother    Heart disease Father    Diabetes Father    Diabetes Sister    Diabetes Brother    Cancer Brother     Prior to Admission medications   Medication Sig Start Date End Date Taking? Authorizing Provider  acetaminophen (TYLENOL) 325 MG tablet Take 650 mg by mouth every 6 (six) hours as needed.    [provider]  amLODipine (NORVASC) 5 MG tablet Take 1 tablet (5 mg total) by mouth daily. 08/25/21   Clayborn Bigness  M, MD  atorvastatin (LIPITOR) 10 MG tablet TAKE 1 TABLET BY MOUTH ONCE DAILY IN THE EVENING 07/19/21   McDonough, Si Gaul, PA-C  Blood Pressure Monitor KIT Use three times daily to check blood pressure  DX:  I50.42 01/13/21   Lavera Guise, MD  citalopram (CELEXA) 20 MG tablet Take 1 tablet (20 mg total) by mouth daily. 08/25/21   Lavera Guise, MD  donepezil (ARICEPT) 5 MG tablet Take 1 tablet (5 mg total) by mouth at bedtime. 11/03/21   Jonetta Osgood, NP  ferrous sulfate 325 (65 FE) MG tablet Take 1 tablet (325 mg total) by mouth daily with breakfast. 12/30/20   Val Riles, MD  linaclotide [facility])  145 MCG CAPS capsule Take 1 capsule (145 mcg total) by mouth daily before breakfast. 11/03/21   Abernathy, Yetta Flock, NP  melatonin 3 MG TABS tablet Take 3 mg by mouth at bedtime as needed (sleep).    [provider]  pantoprazole (PROTONIX) 40 MG tablet Take 1 tablet by mouth twice daily 05/31/21   Lavera Guise, MD  rOPINIRole (REQUIP) 0.5 MG tablet Take 1 tablet (0.5 mg total) by mouth every evening. 11/03/21   Jonetta Osgood, NP  Torsemide 40 MG TABS Take 40 mg by mouth daily. 08/02/21   Lavera Guise, MD  traMADol (ULTRAM) 50 MG tablet Take 1 tablet (50 mg total) by mouth every 6 (six) hours as needed for moderate pain or severe pain. 11/03/21   Jonetta Osgood, NP  vitamin B-12 1000 MCG tablet Take 1 tablet (1,000 mcg total) by mouth daily. 12/30/20   Val Riles, MD  warfarin (COUMADIN) 2 MG tablet Take 1 tablet (2 mg total) by mouth daily. Except on wed and sat take 4 mg 05/04/21   Lavera Guise, MD  warfarin (COUMADIN) 4 MG tablet Take 1 tablet (4 mg total) by mouth every Monday, Wednesday, and Friday. 11/04/21   Jonetta Osgood, NP    Physical Exam: Vitals:   11/30/21 1037 11/30/21 1130 11/30/21 1200 11/30/21 1230  BP: 124/60 115/67 130/67 (!) 112/58  Pulse:  (!) 113 (!) 104 85  Resp:  _0 Temp:      TempSrc:      SpO2:  99% 98% (!) 86%  Weight:      Height:        Constitutional: NAD, calm, comfortable Vitals:   11/30/21 1037 11/30/21 1130 11/30/21 1200 11/30/21 1230  BP: 124/60 115/67 130/67 (!) 112/58  Pulse:  (!) 113 (!) 104 85  Resp:  _1 Temp:      TempSrc:      SpO2:  99% 98% (!) 86%  Weight:      Height:       Eyes: PERRL, lids and conjunctivae normal ENMT: Mucous membranes are moist. Posterior pharynx clear of any exudate or lesions.Normal dentition.  Neck: normal, supple, no masses, no thyromegaly Respiratory: clear to auscultation bilaterally, no wheezing, no crackles. Normal respiratory effort. No accessory muscle use.  Cardiovascular:  Regular rate and rhythm, no murmurs / rubs / gallops. No extremity edema. 2+ pedal pulses. No carotid bruits.  Abdomen: no tenderness, no masses palpated. No hepatosplenomegaly. Bowel sounds positive.  Musculoskeletal: no clubbing / cyanosis.  Admitted range of motion in the left shoulder due to left shoulder fracture. Skin: no rashes, lesions, ulcers. No induration Neurologic: CN 2-12 grossly intact. Sensation intact, DTR normal. Strength 5/5 in all 4.  Psychiatric: Normal judgment and insight. Alert and  oriented x 3. Normal mood.    Labs on Admission: I have personally reviewed following labs and imaging studies  CBC: Recent Labs  Lab 11/30/21 1027  WBC 9.3  NEUTROABS 6.9  HGB 13.0  HCT 38.3  MCV 88.0  PLT 759   Basic Metabolic Panel: Recent Labs  Lab 11/30/21 1027  NA 126*  K 4.0  CL 88*  CO2 28  GLUCOSE 527*  BUN 37*  CREATININE 2.56*  CALCIUM 8.9  MG 2.1   GFR: Estimated Creatinine Clearance: 13.7 mL/min (A) (by C-G formula based on SCr of 2.56 mg/dL (H)). Liver Function Tests: No results for input(s): AST, ALT, ALKPHOS, BILITOT, PROT, ALBUMIN in the last 168 hours. No results for input(s): LIPASE, AMYLASE in the last 168 hours. No results for input(s): AMMONIA in the last 168 hours. Coagulation Profile: Recent Labs  Lab 11/30/21 1027  INR 2.2*   Cardiac Enzymes: No results for input(s): CKTOTAL, CKMB, CKMBINDEX, TROPONINI in the last 168 hours. BNP (last 3 results) No results for input(s): PROBNP in the last 8760 hours. HbA1C: No results for input(s): HGBA1C in the last 72 hours. CBG: Recent Labs  Lab 11/30/21 1226  GLUCAP 440*   Lipid Profile: No results for input(s): CHOL, HDL, LDLCALC, TRIG, CHOLHDL, LDLDIRECT in the last 72 hours. Thyroid Function Tests: Recent Labs    11/30/21 1027  TSH 2.854   Anemia Panel: No results for input(s): VITAMINB12, FOLATE, FERRITIN, TIBC, IRON, RETICCTPCT in the last 72 hours. Urine analysis:    Component  Value Date/Time   COLORURINE YELLOW (A) 12/23/2020 1040   APPEARANCEUR Clear 03/28/2021 1703   LABSPEC 1.013 12/23/2020 1040   PHURINE 6.0 12/23/2020 1040   GLUCOSEU Negative 03/28/2021 1703   HGBUR MODERATE (A) 12/23/2020 1040   BILIRUBINUR Negative 03/28/2021 1703   KETONESUR NEGATIVE 12/23/2020 1040   PROTEINUR 2+ (A) 03/28/2021 1703   PROTEINUR >=300 (A) 12/23/2020 1040   UROBILINOGEN 0.2 09/02/2019 1441   NITRITE Negative 03/28/2021 1703   NITRITE NEGATIVE 12/23/2020 1040   LEUKOCYTESUR Negative 03/28/2021 1703   LEUKOCYTESUR NEGATIVE 12/23/2020 1040    Radiological Exams on Admission: DG Elbow Complete Left  Result Date: 11/30/2021 CLINICAL DATA:  Humeral fracture, fall EXAM: LEFT ELBOW - COMPLETE 3+ VIEW COMPARISON:  None FINDINGS: Osseous demineralization. Joint spaces preserved. No acute fracture, dislocation, or bone destruction. No elbow joint effusion. IMPRESSION: No acute osseous abnormalities. Electronically Signed   By: Lavonia Dana M.D.   On: 11/30/2021 11:28   CT HEAD WO CONTRAST (5MM)  Result Date: 11/30/2021 CLINICAL DATA:  Head and neck trauma.  Fall. EXAM: CT HEAD WITHOUT CONTRAST CT CERVICAL SPINE WITHOUT CONTRAST TECHNIQUE: Multidetector CT imaging of the head and cervical spine was performed following the standard protocol without intravenous contrast. Multiplanar CT image reconstructions of the cervical spine were also generated. RADIATION DOSE REDUCTION: This exam was performed according to the departmental dose-optimization program which includes automated exposure control, adjustment of the mA and/or kV according to patient size and/or use of iterative reconstruction technique. COMPARISON:  Cervical spine MRI 12/28/2020. Head CT and head MRI 12/23/2020. FINDINGS: CT HEAD FINDINGS Brain: There is no evidence of an acute infarct, intracranial hemorrhage, mass, midline shift, or extra-axial fluid collection. Mild cerebral atrophy is within normal limits for age.  Hypodensities in the cerebral white matter bilaterally are unchanged and nonspecific but compatible with mild chronic small vessel ischemic disease. Vascular: Calcified atherosclerosis at the skull base. No hyperdense vessel. Skull: No acute fracture or suspicious  osseous lesion. Sinuses/Orbits: Clear paranasal sinuses. Small chronic bilateral mastoid effusions. Bilateral cataract extraction. Other: None. CT CERVICAL SPINE FINDINGS Alignment: Trace chronic anterolisthesis of C4 on C5 and trace retrolisthesis of C5 on C6. Skull base and vertebrae: No acute fracture or suspicious osseous lesion. Soft tissues and spinal canal: No prevertebral fluid or swelling. No visible canal hematoma. Disc levels: Mild disc degeneration, greatest at C5-6. Multilevel facet arthrosis, focally severe on the right at C4-5. Mild multilevel spinal and neural foraminal stenosis as shown on the prior MRI. Upper chest: No apical lung consolidation or mass. Other: Heavily calcified atherosclerotic plaque at the carotid bifurcations. IMPRESSION: 1. No evidence of acute intracranial abnormality. 2. No evidence of acute cervical spine fracture or traumatic subluxation. Electronically Signed   By: Logan Bores M.D.   On: 11/30/2021 11:56   CT Cervical Spine Wo Contrast  Result Date: 11/30/2021 CLINICAL DATA:  Head and neck trauma.  Fall. EXAM: CT HEAD WITHOUT CONTRAST CT CERVICAL SPINE WITHOUT CONTRAST TECHNIQUE: Multidetector CT imaging of the head and cervical spine was performed following the standard protocol without intravenous contrast. Multiplanar CT image reconstructions of the cervical spine were also generated. RADIATION DOSE REDUCTION: This exam was performed according to the departmental dose-optimization program which includes automated exposure control, adjustment of the mA and/or kV according to patient size and/or use of iterative reconstruction technique. COMPARISON:  Cervical spine MRI 12/28/2020. Head CT and head MRI  12/23/2020. FINDINGS: CT HEAD FINDINGS Brain: There is no evidence of an acute infarct, intracranial hemorrhage, mass, midline shift, or extra-axial fluid collection. Mild cerebral atrophy is within normal limits for age. Hypodensities in the cerebral white matter bilaterally are unchanged and nonspecific but compatible with mild chronic small vessel ischemic disease. Vascular: Calcified atherosclerosis at the skull base. No hyperdense vessel. Skull: No acute fracture or suspicious osseous lesion. Sinuses/Orbits: Clear paranasal sinuses. Small chronic bilateral mastoid effusions. Bilateral cataract extraction. Other: None. CT CERVICAL SPINE FINDINGS Alignment: Trace chronic anterolisthesis of C4 on C5 and trace retrolisthesis of C5 on C6. Skull base and vertebrae: No acute fracture or suspicious osseous lesion. Soft tissues and spinal canal: No prevertebral fluid or swelling. No visible canal hematoma. Disc levels: Mild disc degeneration, greatest at C5-6. Multilevel facet arthrosis, focally severe on the right at C4-5. Mild multilevel spinal and neural foraminal stenosis as shown on the prior MRI. Upper chest: No apical lung consolidation or mass. Other: Heavily calcified atherosclerotic plaque at the carotid bifurcations. IMPRESSION: 1. No evidence of acute intracranial abnormality. 2. No evidence of acute cervical spine fracture or traumatic subluxation. Electronically Signed   By: Logan Bores M.D.   On: 11/30/2021 11:56   DG Shoulder Left  Result Date: 11/30/2021 CLINICAL DATA:  Left shoulder pain, fall, initial encounter. EXAM: LEFT SHOULDER - 2+ VIEW COMPARISON:  None. FINDINGS: There is an impacted fracture of the left humeral neck. Left humeral head is slightly inferiorly displaced, suggesting a hemarthrosis. Widening of the left acromioclavicular joint, of uncertain acuity. Left chest appears grossly unremarkable. IMPRESSION: 1. Impacted left humeral neck fracture with a probable hemarthrosis. 2.  Widening of the left acromioclavicular joint, of uncertain acuity. Electronically Signed   By: Lorin Picket M.D.   On: 11/30/2021 10:19    EKG: Independently reviewed.  Atrial fibrillation with RVR with rates of 121.  Assessment/Plan Principal Problem:   Fall Active Problems:   AF (paroxysmal atrial fibrillation) (HCC)   Essential hypertension   Long term (current) use of anticoagulants   Uncontrolled  type 2 diabetes mellitus with hyperglycemia (HCC)   Type II diabetes mellitus with renal manifestations ([place])   Traumatic closed fracture of surgical neck of left humerus with minimal displacement   Mechanical fall/left shoulder fracture: ER physician has ordered sling.  Orthopedics aware.  She will need follow-up as outpatient in 2 to 3 weeks.  We will start on tramadol.  PT OT consulted.  Paroxysmal atrial fibrillation with RVR: Given Lopressor IV in the ED, rates around 90 now.  She is not on any rate control medications.  She is on Coumadin, INR is therapeutic.  I will consult pharmacy to dose Coumadin.  Uncontrolled type 2 diabetes mellitus with hyperglycemia: Blood sugar over 500.  Patient is not taking any medications.  Per sister, she was told that her diabetes is cured and she does not need medications and she is off her medications for more than a year.  She does not check blood sugar at home.  She is not given anything in the ED other than 1 L of normal saline.  I have requested ED physician to provide her with IV regular insulin 10 units.  We will start her on SSI and check hemoglobin A1c.  CKD stage IV: At baseline.  Essential hypertension: Blood pressure controlled.  Resume home medications.  Hyperlipidemia: Resume statin.  Chronic diastolic congestive heart failure: Appears euvolemic.  Resume home dose of torsemide.  Pseudohyponatremia: Current sodium 126, corrected for hyperglycemia is 133.  DVT prophylaxis:   Coumadin Code Status: DNR Family Communication: Daughter  present at bedside.  Plan of care discussed with patient in length and he verbalized understanding and agreed with it. Disposition Plan: Potentially home tomorrow if cleared by PT OT and pain control. Consults called: Orthopedics called by ED.  Not formally consulted.  Darliss Cheney MD [facility]  Please page via Amion and do not message via secure chat for urgent patient care matters. Secure chat can be used for non urgent patient care matters. 11/30/2021, 12:50 PM  To contact the attending provider between 7A-7P or the covering provider during after hours 7P-7A, please log into the web site [email or web address]

## 2021-12-01 DIAGNOSIS — E1165 Type 2 diabetes mellitus with hyperglycemia: Secondary | ICD-10-CM

## 2021-12-01 DIAGNOSIS — I4891 Unspecified atrial fibrillation: Secondary | ICD-10-CM

## 2021-12-01 DIAGNOSIS — S42302A Unspecified fracture of shaft of humerus, left arm, initial encounter for closed fracture: Secondary | ICD-10-CM

## 2021-12-01 DIAGNOSIS — I10 Essential (primary) hypertension: Secondary | ICD-10-CM | POA: Diagnosis not present

## 2021-12-01 DIAGNOSIS — E1122 Type 2 diabetes mellitus with diabetic chronic kidney disease: Secondary | ICD-10-CM | POA: Diagnosis not present

## 2021-12-01 DIAGNOSIS — Z7901 Long term (current) use of anticoagulants: Secondary | ICD-10-CM | POA: Diagnosis not present

## 2021-12-01 LAB — PROTIME-INR
INR: 2.6 — ABNORMAL HIGH (ref 0.8–1.2)
Prothrombin Time: 27.9 seconds — ABNORMAL HIGH (ref 11.4–15.2)

## 2021-12-01 LAB — GLUCOSE, RANDOM: Glucose, Bld: 386 mg/dL — ABNORMAL HIGH (ref 70–99)

## 2021-12-01 LAB — CBC
HCT: 31.2 % — ABNORMAL LOW (ref 36.0–46.0)
Hemoglobin: 10.6 g/dL — ABNORMAL LOW (ref 12.0–15.0)
MCH: 29.9 pg (ref 26.0–34.0)
MCHC: 34 g/dL (ref 30.0–36.0)
MCV: 88.1 fL (ref 80.0–100.0)
Platelets: 169 10*3/uL (ref 150–400)
RBC: 3.54 MIL/uL — ABNORMAL LOW (ref 3.87–5.11)
RDW: 12.7 % (ref 11.5–15.5)
WBC: 8.5 10*3/uL (ref 4.0–10.5)
nRBC: 0 % (ref 0.0–0.2)

## 2021-12-01 LAB — COMPREHENSIVE METABOLIC PANEL
ALT: 12 U/L (ref 0–44)
AST: 19 U/L (ref 15–41)
Albumin: 2.7 g/dL — ABNORMAL LOW (ref 3.5–5.0)
Alkaline Phosphatase: 80 U/L (ref 38–126)
Anion gap: 7 (ref 5–15)
BUN: 33 mg/dL — ABNORMAL HIGH (ref 8–23)
CO2: 25 mmol/L (ref 22–32)
Calcium: 8.3 mg/dL — ABNORMAL LOW (ref 8.9–10.3)
Chloride: 100 mmol/L (ref 98–111)
Creatinine, Ser: 2.07 mg/dL — ABNORMAL HIGH (ref 0.44–1.00)
GFR, Estimated: 24 mL/min — ABNORMAL LOW (ref >60)
Glucose, Bld: 196 mg/dL — ABNORMAL HIGH (ref 70–99)
Potassium: 3.6 mmol/L (ref 3.5–5.1)
Sodium: 132 mmol/L — ABNORMAL LOW (ref 135–145)
Total Bilirubin: 0.5 mg/dL (ref 0.3–1.2)
Total Protein: 5.9 g/dL — ABNORMAL LOW (ref 6.5–8.1)

## 2021-12-01 LAB — GLUCOSE, CAPILLARY
Glucose-Capillary: 233 mg/dL — ABNORMAL HIGH (ref 70–99)
Glucose-Capillary: 189 mg/dL — ABNORMAL HIGH (ref 70–99)
Glucose-Capillary: 214 mg/dL — ABNORMAL HIGH (ref 70–99)
Glucose-Capillary: 434 mg/dL — ABNORMAL HIGH (ref 70–99)
Glucose-Capillary: 148 mg/dL — ABNORMAL HIGH (ref 70–99)
Glucose-Capillary: 107 mg/dL — ABNORMAL HIGH (ref 70–99)
Glucose-Capillary: 79 mg/dL (ref 70–99)

## 2021-12-01 LAB — MRSA NEXT GEN BY PCR, NASAL: MRSA by PCR Next Gen: NOT DETECTED

## 2021-12-01 MED ORDER — WARFARIN SODIUM 2 MG PO TABS
2.0000 mg | ORAL_TABLET | Freq: Once | ORAL | Status: AC
  Administered 2021-12-01: 2 mg via ORAL
  Filled 2021-12-01 (×2): qty 1

## 2021-12-01 MED ORDER — INSULIN ASPART 100 UNIT/ML IJ SOLN
3.0000 [IU] | Freq: Three times a day (TID) | INTRAMUSCULAR | Status: DC
  Administered 2021-12-01 – 2021-12-02 (×4): 3 [IU] via SUBCUTANEOUS
  Filled 2021-12-01 (×4): qty 1

## 2021-12-01 MED ORDER — INSULIN GLARGINE-YFGN 100 UNIT/ML ~~LOC~~ SOLN
6.0000 [IU] | Freq: Every day | SUBCUTANEOUS | Status: DC
  Administered 2021-12-01 – 2021-12-02 (×2): 6 [IU] via SUBCUTANEOUS
  Filled 2021-12-01 (×2): qty 0.06

## 2021-12-01 NOTE — Consult Note (Signed)
ORTHOPAEDIC CONSULTATION  REQUESTING PHYSICIAN: Fritzi Mandes, MD  Chief Complaint: Left shoulder pain s/p fall  HPI: [name] is a 81 y.o. female was admitted yesterday after a fall at home.  Patient injured her left shoulder in the fall.  Patient states she was on the back porch smoking a cigarette and fell when she turned to go back into the house.  Patient denies any dizziness or lightheadedness prior to her fall.  Patient denies any numbness tingling in the left upper extremity or other injuries.  Patient was brought to the [facility] emergency department where x-rays revealed an impacted left proximal humerus fracture.  Orthopedics is consulted for recommendations on management for the patient's left proximal humerus fracture.  Patient is on Coumadin for atrial fibrillation.  Past Medical History:  Diagnosis Date   Anemia    Atrial fibrillation (HCC)    Chronic kidney disease    Congestive heart failure (CHF) (HCC)    Diabetes mellitus without complication (HCC)    GERD (gastroesophageal reflux disease)    Hyperlipidemia    Hypertension    Pneumonia    Pulmonary fibrosis ([place])    Past Surgical History:  Procedure Laterality Date   ABDOMINAL HYSTERECTOMY     APPENDECTOMY     CATARACT EXTRACTION     CHOLECYSTECTOMY     HERNIA REPAIR     right knee replacement     right nephroectomy     Social History   Socioeconomic History   Marital status: Widowed    Spouse name: Not on file   Number of children: Not on file   Years of education: Not on file   Highest education level: Not on file  Occupational History   Occupation: retired  Tobacco Use   Smoking status: Former    Packs/day: 1.00    Types: Cigarettes    Quit date: 02/10/2021    Years since quitting: 0.8   Smokeless tobacco: Never  Vaping Use   Vaping Use: Never used  Substance and Sexual Activity   Alcohol use: No   Drug use: No   Sexual activity: Not Currently  Other Topics Concern   Not on  file  Social History Narrative   Lives with daughter   Social Determinants of Health   Financial Resource Strain: Not on file  Food Insecurity: Not on file  Transportation Needs: Not on file  Physical Activity: Not on file  Stress: Not on file  Social Connections: Not on file   Family History  Problem Relation Age of Onset   Diabetes Mother    Breast cancer Mother    Heart disease Father    Diabetes Father    Diabetes Sister    Diabetes Brother    Cancer Brother    No Known Allergies Prior to Admission medications   Medication Sig Start Date End Date Taking? Authorizing Provider  acetaminophen (TYLENOL) 325 MG tablet Take 650 mg by mouth every 6 (six) hours as needed.   Yes [provider]  amLODipine (NORVASC) 5 MG tablet Take 1 tablet (5 mg total) by mouth daily. 08/25/21  Yes Lavera Guise, MD  atorvastatin (LIPITOR) 10 MG tablet TAKE 1 TABLET BY MOUTH ONCE DAILY IN THE EVENING 07/19/21  Yes McDonough, Lauren K, PA-C  citalopram (CELEXA) 20 MG tablet Take 1 tablet (20 mg total) by mouth daily. 08/25/21  Yes Lavera Guise, MD  donepezil (ARICEPT) 5 MG tablet Take 1 tablet (5 mg total) by mouth at  bedtime. Patient taking differently: Take 5 mg by mouth in the morning. 11/03/21  Yes Abernathy, Yetta Flock, NP  ferrous sulfate 325 (65 FE) MG tablet Take 1 tablet (325 mg total) by mouth daily with breakfast. 12/30/20  Yes Val Riles, MD  furosemide (LASIX) 40 MG tablet Take 40 mg by mouth daily.   Yes [provider]  linaclotide (LINZESS) 145 MCG CAPS capsule Take 1 capsule (145 mcg total) by mouth daily before breakfast. Patient taking differently: Take 145 mcg by mouth at bedtime. 11/03/21  Yes Abernathy, Alyssa, NP  melatonin 3 MG TABS tablet Take 3 mg by mouth at bedtime as needed (sleep).   Yes [provider]  pantoprazole (PROTONIX) 40 MG tablet Take 1 tablet by mouth twice daily 05/31/21  Yes Lavera Guise, MD  rOPINIRole (REQUIP) 0.5 MG tablet Take 1  tablet (0.5 mg total) by mouth every evening. 11/03/21  Yes Abernathy, Alyssa, NP  Torsemide 40 MG TABS Take 40 mg by mouth daily. 08/02/21  Yes Lavera Guise, MD  traMADol (ULTRAM) 50 MG tablet Take 1 tablet (50 mg total) by mouth every 6 (six) hours as needed for moderate pain or severe pain. 11/03/21  Yes Abernathy, Yetta Flock, NP  vitamin B-12 1000 MCG tablet Take 1 tablet (1,000 mcg total) by mouth daily. 12/30/20  Yes Val Riles, MD  warfarin (COUMADIN) 2 MG tablet Take 1 tablet (2 mg total) by mouth daily. Except on wed and sat take 4 mg 05/04/21  Yes Lavera Guise, MD  Blood Pressure Monitor KIT Use three times daily to check blood pressure  DX:  I50.42 01/13/21   Lavera Guise, MD  warfarin (COUMADIN) 4 MG tablet Take 1 tablet (4 mg total) by mouth every Monday, Wednesday, and Friday. 11/04/21   Jonetta Osgood, NP   DG Elbow Complete Left  Result Date: 11/30/2021 CLINICAL DATA:  Humeral fracture, fall EXAM: LEFT ELBOW - COMPLETE 3+ VIEW COMPARISON:  None FINDINGS: Osseous demineralization. Joint spaces preserved. No acute fracture, dislocation, or bone destruction. No elbow joint effusion. IMPRESSION: No acute osseous abnormalities. Electronically Signed   By: Lavonia Dana M.D.   On: 11/30/2021 11:28   CT HEAD WO CONTRAST (5MM)  Result Date: 11/30/2021 CLINICAL DATA:  Head and neck trauma.  Fall. EXAM: CT HEAD WITHOUT CONTRAST CT CERVICAL SPINE WITHOUT CONTRAST TECHNIQUE: Multidetector CT imaging of the head and cervical spine was performed following the standard protocol without intravenous contrast. Multiplanar CT image reconstructions of the cervical spine were also generated. RADIATION DOSE REDUCTION: This exam was performed according to the departmental dose-optimization program which includes automated exposure control, adjustment of the mA and/or kV according to patient size and/or use of iterative reconstruction technique. COMPARISON:  Cervical spine MRI 12/28/2020. Head CT and head MRI  12/23/2020. FINDINGS: CT HEAD FINDINGS Brain: There is no evidence of an acute infarct, intracranial hemorrhage, mass, midline shift, or extra-axial fluid collection. Mild cerebral atrophy is within normal limits for age. Hypodensities in the cerebral white matter bilaterally are unchanged and nonspecific but compatible with mild chronic small vessel ischemic disease. Vascular: Calcified atherosclerosis at the skull base. No hyperdense vessel. Skull: No acute fracture or suspicious osseous lesion. Sinuses/Orbits: Clear paranasal sinuses. Small chronic bilateral mastoid effusions. Bilateral cataract extraction. Other: None. CT CERVICAL SPINE FINDINGS Alignment: Trace chronic anterolisthesis of C4 on C5 and trace retrolisthesis of C5 on C6. Skull base and vertebrae: No acute fracture or suspicious osseous lesion. Soft tissues and spinal canal: No prevertebral fluid  or swelling. No visible canal hematoma. Disc levels: Mild disc degeneration, greatest at C5-6. Multilevel facet arthrosis, focally severe on the right at C4-5. Mild multilevel spinal and neural foraminal stenosis as shown on the prior MRI. Upper chest: No apical lung consolidation or mass. Other: Heavily calcified atherosclerotic plaque at the carotid bifurcations. IMPRESSION: 1. No evidence of acute intracranial abnormality. 2. No evidence of acute cervical spine fracture or traumatic subluxation. Electronically Signed   By: Logan Bores M.D.   On: 11/30/2021 11:56   CT Cervical Spine Wo Contrast  Result Date: 11/30/2021 CLINICAL DATA:  Head and neck trauma.  Fall. EXAM: CT HEAD WITHOUT CONTRAST CT CERVICAL SPINE WITHOUT CONTRAST TECHNIQUE: Multidetector CT imaging of the head and cervical spine was performed following the standard protocol without intravenous contrast. Multiplanar CT image reconstructions of the cervical spine were also generated. RADIATION DOSE REDUCTION: This exam was performed according to the departmental dose-optimization program  which includes automated exposure control, adjustment of the mA and/or kV according to patient size and/or use of iterative reconstruction technique. COMPARISON:  Cervical spine MRI 12/28/2020. Head CT and head MRI 12/23/2020. FINDINGS: CT HEAD FINDINGS Brain: There is no evidence of an acute infarct, intracranial hemorrhage, mass, midline shift, or extra-axial fluid collection. Mild cerebral atrophy is within normal limits for age. Hypodensities in the cerebral white matter bilaterally are unchanged and nonspecific but compatible with mild chronic small vessel ischemic disease. Vascular: Calcified atherosclerosis at the skull base. No hyperdense vessel. Skull: No acute fracture or suspicious osseous lesion. Sinuses/Orbits: Clear paranasal sinuses. Small chronic bilateral mastoid effusions. Bilateral cataract extraction. Other: None. CT CERVICAL SPINE FINDINGS Alignment: Trace chronic anterolisthesis of C4 on C5 and trace retrolisthesis of C5 on C6. Skull base and vertebrae: No acute fracture or suspicious osseous lesion. Soft tissues and spinal canal: No prevertebral fluid or swelling. No visible canal hematoma. Disc levels: Mild disc degeneration, greatest at C5-6. Multilevel facet arthrosis, focally severe on the right at C4-5. Mild multilevel spinal and neural foraminal stenosis as shown on the prior MRI. Upper chest: No apical lung consolidation or mass. Other: Heavily calcified atherosclerotic plaque at the carotid bifurcations. IMPRESSION: 1. No evidence of acute intracranial abnormality. 2. No evidence of acute cervical spine fracture or traumatic subluxation. Electronically Signed   By: Logan Bores M.D.   On: 11/30/2021 11:56   DG Shoulder Left  Result Date: 11/30/2021 CLINICAL DATA:  Left shoulder pain, fall, initial encounter. EXAM: LEFT SHOULDER - 2+ VIEW COMPARISON:  None. FINDINGS: There is an impacted fracture of the left humeral neck. Left humeral head is slightly inferiorly displaced, suggesting  a hemarthrosis. Widening of the left acromioclavicular joint, of uncertain acuity. Left chest appears grossly unremarkable. IMPRESSION: 1. Impacted left humeral neck fracture with a probable hemarthrosis. 2. Widening of the left acromioclavicular joint, of uncertain acuity. Electronically Signed   By: Lorin Picket M.D.   On: 11/30/2021 10:19    Positive ROS: All other systems have been reviewed and were otherwise negative with the exception of those mentioned in the HPI and as above.  Physical Exam: General: Alert, no acute distress.  Patient sitting up in the chair in her hospital room.  MUSCULOSKELETAL: Left shoulder: Patient skin is intact.  There is no significant ecchymosis or swelling.  Her compartments of the left arm are soft and compressible.  She can flex and extend all 5 digits of the left hand.  Her fingers well-perfused and she has a palpable radial pulse.  Patient had  pain with any movement to the left shoulder.  Assessment: Left impacted proximal humerus fracture.  Plan: I personally reviewed the patient's left shoulder x-rays.  She has an impacted left proximal humerus fracture without significant displacement or angulation.  In its current position this fracture can be managed nonoperatively.  I recommend that she continue her left shoulder sling.  She will follow-up in our office in 1 to 2 weeks upon discharge from the hospital for reevaluation and x-ray in the office.  I explained to the patient that she should continue to wear her left shoulder sling except for showers once she is discharged from the hospital.  Patient should avoid any active motion to the left shoulder, along with no pushing, pulling, lifting or weightbearing till follow-up in the office..  I adjusted the patient's sling for better fit as her elbow was hyperflexed initially.  Patient understood and agreed with plan.   Thornton Park, MD    12/01/2021 10:55 AM

## 2021-12-01 NOTE — Progress Notes (Signed)
Occupational Therapy Treatment Patient Details Name: [name] MRN: [id number] DOB: 1941/01/15 Today's Date: 12/01/2021   History of present illness Patient is a 81 y.o. female with medical history significant of paroxysmal atrial fibrillation, CKD stage IV, diastolic congestive heart failure, type 2 diabetes mellitus, hypertension, hyperlipidemia and pulmonary fibrosis who was brought into the ED after a fall.  Left shoulder x-ray showed impacted left middle neck fracture with probable hemarthrosis   OT comments  Upon entering the room, pt supine in bed and agreeable to OT intervention. Pt performed bed mobility with supervision and cuing for technique. OT adjusted sling as needed for proper positioning. Pt standing and ambulating with SPC and min guard 20' to bathroom. Pt performed toilet transfer and able to void successfully. Pt performs hygiene while seated showing good safety awareness and ambulates back to room in same manner as above. Pt maintains NWB throughout session without additional cuing for precautions. Pt requesting to sit up in recliner chair and transfer to recliner without issue. Chair alarm activated for safety and call bell within reach. RN arrives as therapist exits. Pt continues to be appropriate for home health services at discharge. No family present this session to educate on self care tasks with precautions.    Recommendations for follow up therapy are one component of a multi-disciplinary discharge planning process, led by the attending physician.  Recommendations may be updated based on patient status, additional functional criteria and insurance authorization.    Follow Up Recommendations  Home health OT    Assistance Recommended at Discharge Frequent or constant Supervision/Assistance  Patient can return home with the following  A little help with walking and/or transfers;A little help with bathing/dressing/bathroom;Assistance with cooking/housework;Assist for  transportation;Help with stairs or ramp for entrance   Equipment Recommendations  BSC/3in1;Tub/shower seat       Precautions / Restrictions Precautions Precautions: Fall Required Braces or Orthoses: Sling Restrictions Weight Bearing Restrictions: Yes LUE Weight Bearing: Non weight bearing Other Position/Activity Restrictions: conservative tx to L fumeral fx with sling to be donned and NWB       Mobility Bed Mobility Overal bed mobility: Needs Assistance Bed Mobility: Supine to Sit, Sit to Supine     Supine to sit: Supervision Sit to supine: Supervision   General bed mobility comments: increased time with HOB elevated but no physical assistance needed.    Transfers Overall transfer level: Needs assistance Equipment used: Straight cane Transfers: Sit to/from Stand, Bed to chair/wheelchair/BSC Sit to Stand: Min guard     Step pivot transfers: Min guard           Balance Overall balance assessment: Needs assistance Sitting-balance support: Feet unsupported, No upper extremity supported Sitting balance-Leahy Scale: Good Sitting balance - Comments: patient able to maintain sitting balance without support, no UE support     Standing balance-Leahy Scale: Fair Standing balance comment: using cane for support in RUE                           ADL either performed or assessed with clinical judgement   ADL Overall ADL's : Needs assistance/impaired                         Toilet Transfer: Nature conservation officer;Ambulation Toilet Transfer Details (indicate cue type and reason): ambulation with SPC and min guard for balance Toileting- Clothing Manipulation and Hygiene: Min guard;Sit to/from stand Toileting - Water quality scientist Details (indicate cue  type and reason): pt remained seated for hygiene showing good safety awareness     Functional mobility during ADLs: Min guard;Cane       Vision Baseline Vision/History: 2 Legally blind Patient  Visual Report: No change from baseline            Cognition Arousal/Alertness: Awake/alert Behavior During Therapy: WFL for tasks assessed/performed Overall Cognitive Status: Within Functional Limits for tasks assessed                                                     Pertinent Vitals/ Pain       Pain Assessment Pain Assessment: Faces Faces Pain Scale: Hurts a little bit Pain Location: left upper arm during mobility Pain Descriptors / Indicators: Sore, Discomfort Pain Intervention(s): Limited activity within patient's tolerance, Repositioned, Monitored during session         Frequency  Min 2X/week        Progress Toward Goals  OT Goals(current goals can now be found in the care plan section)  Progress towards OT goals: Progressing toward goals  Acute Rehab OT Goals Patient Stated Goal: to go home OT Goal Formulation: With patient Time For Goal Achievement: 12/14/21 Potential to Achieve Goals: Good  Plan Discharge plan remains appropriate;Frequency remains appropriate       AM-PAC OT "6 Clicks" Daily Activity     Outcome Measure   Help from another person eating meals?: A Little Help from another person taking care of personal grooming?: A Little Help from another person toileting, which includes using toliet, bedpan, or urinal?: A Lot Help from another person bathing (including washing, rinsing, drying)?: A Lot Help from another person to put on and taking off regular upper body clothing?: A Little Help from another person to put on and taking off regular lower body clothing?: A Lot 6 Click Score: 15    End of Session    OT Visit Diagnosis: Unsteadiness on feet (R26.81);Muscle weakness (generalized) (M62.81);History of falling (Z91.81)   Activity Tolerance Patient tolerated treatment well   Patient Left in bed;with call bell/phone within reach;with nursing/sitter in room;with family/visitor present   Nurse Communication Mobility  status        Time: 1410-1436 OT Time Calculation (min): 26 min  Charges: OT General Charges $OT Visit: 1 Visit OT Treatments $Self Care/Home Management : 23-37 mins  Darleen Crocker, MS, OTR/L , CBIS ascom [phone]  12/01/21, 4:37 PM

## 2021-12-01 NOTE — TOC Progression Note (Signed)
Transition of Care [facility]) - Progression Note    Patient Details  Name: [name] MRN: [id number] Date of Birth: [birth year]  Transition of [facility]) CM/SW Contact  Shelbie Hutching, RN Phone Number: 12/01/2021, 3:16 PM  Clinical Narrative:    [facility] is unable to accept referral for home health services.  Referral given to [facility].     Expected Discharge Plan: [place] Barriers to Discharge: Continued Medical Work up  Expected Discharge Plan and Services Expected Discharge Plan: [place]   Discharge Planning Services: CM Consult Post Acute Care Choice: [place] arrangements for the past 2 months: Mobile Home                 DME Arranged: 3-N-1 DME Agency: [employer] Date DME Agency Contacted: 12/01/21 Time DME Agency Contacted: 1215 Representative spoke with at DME Agency: Andee Poles HH Arranged: RN, PT, OT [facility] Agency: [facility] Date [place]: 12/01/21 Time [place]: 3557 Representative spoke with at [place]: [place] ([place]) Interventions    Readmission Risk Interventions Readmission Risk Prevention Plan 06/29/2021 05/27/2021  Transportation Screening Complete Complete  Medication Review Press photographer) Complete Complete  PCP or Specialist appointment within 3-5 days of discharge Complete Complete  HRI or Home Care Consult Complete Complete  SW Recovery Care/Counseling Consult Complete Complete  Palliative Care Screening Not Applicable Complete  [place] Not Applicable Complete  Some recent data might be hidden

## 2021-12-01 NOTE — TOC Initial Note (Signed)
Transition of Care [name]) - Initial/Assessment Note    Patient Details  Name: [name] MRN: [id number] Date of Birth: [birth year]  Transition of Care [name]) CM/SW Contact:    Amy Hutching, RN Phone Number: 12/01/2021, 12:16 PM  Clinical Narrative:                 Patient admitted to the [name] after falling at home and fracturing her right humerus.  Patient also came in with very high blood sugar and A1C of 13.5.   RNCM met with patient at the bedside.  She is from home with her daughter, she walks with a cane.  Daughter provides transportation.  PT and OT have recommended [name] services and a 3 in 1.  3 in 1 ordered from [employer] and to be delivered to the room.  Patient has had [facility] with [name] in the past.  [facility] Referral given to [name] with [name] for RN, PT and OT.  Patient will benefit from home health nursing for diabetes education and management.   TOC will cont to follow.       Expected Discharge Plan: [name] to Discharge: Continued Medical Work up   Patient Goals and CMS Choice Patient states their goals for this hospitalization and ongoing recovery are:: to get better and get home CMS Medicare.gov Compare Post Acute Care list provided to:: Patient Choice offered to / list presented to : Patient  Expected Discharge Plan and Services Expected Discharge Plan: [place]   Discharge Planning Services: CM Consult Post Acute Care Choice: [name] arrangements for the past 2 months: Mobile Home                 DME Arranged: 3-N-1 DME Agency: [name] Date DME Agency Contacted: 12/01/21 Time DME Agency Contacted: 1215 Representative spoke with at DME Agency: [name] HH Arranged: RN, PT, OT [name] [place] Agency: [name] Date [place]: 12/01/21 Time [place]: 1215 Representative spoke with at [place]: [name]  Prior Living Arrangements/Services Living arrangements for the  past 2 months: [place] with:: Adult Children Patient language and need for interpreter reviewed:: Yes Do you feel safe going back to the place where you live?: Yes      Need for Family Participation in Patient Care: Yes (Comment) Care giver support system in place?: Yes (comment) Current home services: DME (cane) Criminal Activity/Legal Involvement Pertinent to Current Situation/Hospitalization: No - Comment as needed  Activities of Daily Living      Permission Sought/Granted Permission sought to share information with : Case Manager, Family Supports, Other (comment) Permission granted to share information with : Yes, Verbal Permission Granted  Share Information with NAME: [name] granted to share info w AGENCY: [name]  Permission granted to share info w Relationship: daughter  Permission granted to share info w Contact Information: [phone]  Emotional Assessment Appearance:: Appears stated age Attitude/Demeanor/Rapport: Engaged Affect (typically observed): Accepting Orientation: : Oriented to Self, Oriented to Place, Oriented to  Time, Oriented to Situation Alcohol / Substance Use: Tobacco Use Psych Involvement: No (comment)  Admission diagnosis:  Fall [W19.XXXA] Hyperglycemia [R73.9] Atrial fibrillation with RVR ([place]) [I48.91] Fall, initial encounter [W19.XXXA] Closed fracture of left upper extremity, initial encounter [S42.302A] Patient Active Problem List   Diagnosis Date Noted   Fall 11/30/2021   Traumatic closed fracture of surgical neck of left humerus with minimal displacement 11/30/2021   Ingrown  nail of great toe of left foot 08/18/2021   Acute on chronic diastolic congestive heart failure ([place]) 05/24/2021   Type II diabetes mellitus with renal manifestations ([place]) 05/24/2021   HLD (hyperlipidemia) 05/24/2021   Chest pain 05/24/2021   Depression 05/24/2021   Legally blind 05/24/2021   Abdominal pain 05/24/2021   Chronic  respiratory failure with hypoxia ([place]) 03/16/2021   SOB (shortness of breath) 03/15/2021   Benign hypertensive kidney disease with chronic kidney disease 03/14/2021   Chronic kidney disease, stage IV (severe) ([place]) 03/14/2021   Acute renal failure superimposed on stage 4 chronic kidney disease ([place]) 02/08/2021   Anemia associated with stage 4 chronic renal failure ([place]) 02/08/2021   Anemia due to stage 5 chronic kidney disease ([place]) 02/07/2021   Acute CHF (congestive heart failure) ([place]) 02/06/2021   Acute on chronic diastolic CHF (congestive heart failure) ([place]) 02/06/2021   Hyperkalemia 02/06/2021   Weakness 12/24/2020   Hypertensive urgency 12/23/2020   Nicotine dependence 12/23/2020   History of anemia due to chronic kidney disease 12/23/2020   Pain due to onychomycosis of toenails of both feet 02/12/2020   Uncontrolled type 2 diabetes mellitus with hyperglycemia ([place]) 05/20/2019   Acute recurrent pansinusitis 05/11/2019   Acute otitis media 05/11/2019   Acute pain of left shoulder 05/11/2019   Long term (current) use of anticoagulants 04/03/2019   Chronic left shoulder pain 03/18/2019   Sepsis ([place]) 01/12/2018   Colitis 01/12/2018   CKD (chronic kidney disease), stage III ([place]) 01/12/2018   Diabetes ([place]) 12/28/2017   AF (paroxysmal atrial fibrillation) ([place]) 12/28/2017   Encounter for general adult medical examination with abnormal findings 12/28/2017   Primary generalized (osteo)arthritis 12/28/2017   Hypertension 12/27/2017   GERD (gastroesophageal reflux disease) 12/27/2017   Hematuria 03/18/2015   Chronic combined systolic and diastolic heart failure ([place]) 03/01/2012   Obesity 03/01/2012   CKD stage 4 due to type 2 diabetes mellitus ([place]) 03/01/2012   Other benign neoplasm of connective and other soft tissue of lower limb, including hip 03/15/2011   Neoplasm of connective tissue 02/01/2011   Pain in joint, pelvic region and thigh 02/01/2011   Type 2 diabetes mellitus  with stage 5 chronic kidney disease ([place]) 10/03/2006   Encounter for current long-term use of anticoagulants 02/07/2006   Essential hypertension 09/24/2004   Atrial fibrillation ([place]) 06/07/2004   PCP:  Lavera Guise, MD Pharmacy:   [facility] [address] [place] [address] [place]) [place] [address] Phone: [phone] Fax: [phone]     Social Determinants of Health (SDOH) Interventions    Readmission Risk Interventions Readmission Risk Prevention Plan 06/29/2021 05/27/2021  Transportation Screening Complete Complete  Medication Review ([place]) Complete Complete  PCP or Specialist appointment within 3-5 days of discharge Complete Complete  HRI or Home Care Consult Complete Complete  SW Recovery Care/Counseling Consult Complete Complete  Palliative Care Screening Not Applicable Complete  Skilled Nursing Facility Not Applicable Complete  Some recent data might be hidden

## 2021-12-01 NOTE — Progress Notes (Signed)
[name] NAME: [name]    MR#:  [id number]  DATE OF BIRTH:  11-11-1940  SUBJECTIVE:   patient came in after mechanical fall at home. She was found to have sugar in the 500s. Developed left shoulder pain and was found to have numerous fracture. Currently in sling. Patient out in the recliner this morning. Sugars still up to hundred two 400. No family at bedside. Did work with physical therapy   VITALS:  Blood pressure (!) 117/48, pulse 95, temperature 98.4 F (36.9 C), resp. rate 16, height 4\' 10"  (1.473 m), weight 62.5 kg, SpO2 100 %.  PHYSICAL EXAMINATION:   GENERAL:  81 y.o.-year-old patient lying in the bed with no acute distress.  LUNGS: Normal breath sounds bilaterally, no wheezing, rales, rhonchi.  CARDIOVASCULAR: S1, S2 normal. No murmurs, rubs, or gallops.  ABDOMEN: Soft, nontender, nondistended. Bowel sounds present.  EXTREMITIES: No  edema b/l.   Left arm sling+ NEUROLOGIC: nonfocal PSYCHIATRIC:  patient is alert and awake SKIN: No obvious rash, lesion, or ulcer.   LABORATORY PANEL:  CBC Recent Labs  Lab 12/01/21 0443  WBC 8.5  HGB 10.6*  HCT 31.2*  PLT 169    Chemistries  Recent Labs  Lab 11/30/21 1027 12/01/21 0443  NA 126* 132*  K 4.0 3.6  CL 88* 100  CO2 28 25  GLUCOSE 527* 196*  BUN 37* 33*  CREATININE 2.56* 2.07*  CALCIUM 8.9 8.3*  MG 2.1  --   AST  --  19  ALT  --  12  ALKPHOS  --  80  BILITOT  --  0.5   Cardiac Enzymes No results for input(s): TROPONINI in the last 168 hours. RADIOLOGY:  DG Elbow Complete Left  Result Date: 11/30/2021 CLINICAL DATA:  Humeral fracture, fall EXAM: LEFT ELBOW - COMPLETE 3+ VIEW COMPARISON:  None FINDINGS: Osseous demineralization. Joint spaces preserved. No acute fracture, dislocation, or bone destruction. No elbow joint effusion. IMPRESSION: No acute osseous abnormalities. Electronically Signed   By: Lavonia Dana M.D.   On: 11/30/2021 11:28   CT  HEAD WO CONTRAST (5MM)  Result Date: 11/30/2021 CLINICAL DATA:  Head and neck trauma.  Fall. EXAM: CT HEAD WITHOUT CONTRAST CT CERVICAL SPINE WITHOUT CONTRAST TECHNIQUE: Multidetector CT imaging of the head and cervical spine was performed following the standard protocol without intravenous contrast. Multiplanar CT image reconstructions of the cervical spine were also generated. RADIATION DOSE REDUCTION: This exam was performed according to the departmental dose-optimization program which includes automated exposure control, adjustment of the mA and/or kV according to patient size and/or use of iterative reconstruction technique. COMPARISON:  Cervical spine MRI 12/28/2020. Head CT and head MRI 12/23/2020. FINDINGS: CT HEAD FINDINGS Brain: There is no evidence of an acute infarct, intracranial hemorrhage, mass, midline shift, or extra-axial fluid collection. Mild cerebral atrophy is within normal limits for age. Hypodensities in the cerebral white matter bilaterally are unchanged and nonspecific but compatible with mild chronic small vessel ischemic disease. Vascular: Calcified atherosclerosis at the skull base. No hyperdense vessel. Skull: No acute fracture or suspicious osseous lesion. Sinuses/Orbits: Clear paranasal sinuses. Small chronic bilateral mastoid effusions. Bilateral cataract extraction. Other: None. CT CERVICAL SPINE FINDINGS Alignment: Trace chronic anterolisthesis of C4 on C5 and trace retrolisthesis of C5 on C6. Skull base and vertebrae: No acute fracture or suspicious osseous lesion. Soft tissues and spinal canal: No prevertebral fluid or swelling. No visible canal hematoma. Disc levels: Mild disc  degeneration, greatest at C5-6. Multilevel facet arthrosis, focally severe on the right at C4-5. Mild multilevel spinal and neural foraminal stenosis as shown on the prior MRI. Upper chest: No apical lung consolidation or mass. Other: Heavily calcified atherosclerotic plaque at the carotid bifurcations.  IMPRESSION: 1. No evidence of acute intracranial abnormality. 2. No evidence of acute cervical spine fracture or traumatic subluxation. Electronically Signed   By: Logan Bores M.D.   On: 11/30/2021 11:56   CT Cervical Spine Wo Contrast  Result Date: 11/30/2021 CLINICAL DATA:  Head and neck trauma.  Fall. EXAM: CT HEAD WITHOUT CONTRAST CT CERVICAL SPINE WITHOUT CONTRAST TECHNIQUE: Multidetector CT imaging of the head and cervical spine was performed following the standard protocol without intravenous contrast. Multiplanar CT image reconstructions of the cervical spine were also generated. RADIATION DOSE REDUCTION: This exam was performed according to the departmental dose-optimization program which includes automated exposure control, adjustment of the mA and/or kV according to patient size and/or use of iterative reconstruction technique. COMPARISON:  Cervical spine MRI 12/28/2020. Head CT and head MRI 12/23/2020. FINDINGS: CT HEAD FINDINGS Brain: There is no evidence of an acute infarct, intracranial hemorrhage, mass, midline shift, or extra-axial fluid collection. Mild cerebral atrophy is within normal limits for age. Hypodensities in the cerebral white matter bilaterally are unchanged and nonspecific but compatible with mild chronic small vessel ischemic disease. Vascular: Calcified atherosclerosis at the skull base. No hyperdense vessel. Skull: No acute fracture or suspicious osseous lesion. Sinuses/Orbits: Clear paranasal sinuses. Small chronic bilateral mastoid effusions. Bilateral cataract extraction. Other: None. CT CERVICAL SPINE FINDINGS Alignment: Trace chronic anterolisthesis of C4 on C5 and trace retrolisthesis of C5 on C6. Skull base and vertebrae: No acute fracture or suspicious osseous lesion. Soft tissues and spinal canal: No prevertebral fluid or swelling. No visible canal hematoma. Disc levels: Mild disc degeneration, greatest at C5-6. Multilevel facet arthrosis, focally severe on the right at  C4-5. Mild multilevel spinal and neural foraminal stenosis as shown on the prior MRI. Upper chest: No apical lung consolidation or mass. Other: Heavily calcified atherosclerotic plaque at the carotid bifurcations. IMPRESSION: 1. No evidence of acute intracranial abnormality. 2. No evidence of acute cervical spine fracture or traumatic subluxation. Electronically Signed   By: Logan Bores M.D.   On: 11/30/2021 11:56   DG Shoulder Left  Result Date: 11/30/2021 CLINICAL DATA:  Left shoulder pain, fall, initial encounter. EXAM: LEFT SHOULDER - 2+ VIEW COMPARISON:  None. FINDINGS: There is an impacted fracture of the left humeral neck. Left humeral head is slightly inferiorly displaced, suggesting a hemarthrosis. Widening of the left acromioclavicular joint, of uncertain acuity. Left chest appears grossly unremarkable. IMPRESSION: 1. Impacted left humeral neck fracture with a probable hemarthrosis. 2. Widening of the left acromioclavicular joint, of uncertain acuity. Electronically Signed   By: Lorin Picket M.D.   On: 11/30/2021 10:19    Assessment and Plan  CHEYANE AYON is a 81 y.o. female with medical history significant of paroxysmal atrial fibrillation, CKD stage IV, diastolic congestive heart failure, type 2 diabetes mellitus, hypertension, hyperlipidemia and pulmonary fibrosis who was brought into the ED after a fall.    Left Humerus neck fracture status post mechanical fall -- continue with sling -- patient seen by Dr. Mack Guise recommends follow-up 7 to 10 days as outpatient -- PRN pain meds -- seen by physical therapy occupational therapy--- recommends home health  atrial fibrillation paroxysmal with RVR -- currently in sinus rhythm -- patient not on any rate control medications  due to history of bradycardia in the past -- she is on Coumadin with therapeutic INR will continue for now -- pharmacy to manage  type II diabetes, hyperglycemia, uncontrolled -- patient not on any meds at  home. -- Diet control -- A1c three months ago 6.3 -- A1c yesterday 13.5-- patient reports noncompliant to diet -- patient was on insulin before. Discussed with diabetes coordinator will start on long-acting and short acting insulin -- left message for daughter call me back  CKD stage 4 with type II diabetes -- creatinine baseline 2.052---2.4  Hypertension -- blood pressure was soft this morning. Will hold BP meds today  chronic diastolic heart failure --Euvolemic -- holding diuretic today  hyponatremia appears due to uncontrolled diabetes -- sodium improved 126--133  transfer out of ICU discharge   Procedures: Family communication : left message for daughter to call me back Consults : orthopedic CODE STATUS: DNR DVT Prophylaxis : Coumadin Level of care: Telemetry Medical Status is: Observation  transfer patient out of ICU. Will continue to monitor sugars. Patient started on insulin treatment.  Anticipate discharge likely tomorrow if sugars are stable          TOTAL TIME TAKING CARE OF THIS PATIENT: 25 minutes.  >50% time spent on counselling and coordination of care  Note: This dictation was prepared with Dragon dictation along with smaller phrase technology. Any transcriptional errors that result from this process are unintentional.  Fritzi Mandes M.D    [facility]   CC: Primary care physician; Lavera Guise, MD

## 2021-12-01 NOTE — Consult Note (Signed)
ANTICOAGULATION CONSULT NOTE  Pharmacy Consult for warfarin Indication: atrial fibrillation  Patient Measurements: Height: 4\' 10"  (147.3 cm) Weight: 62.5 kg (137 lb 12.6 oz) IBW/kg (Calculated) : 40.9  Labs: Recent Labs    11/30/21 1027 12/01/21 0443  HGB 13.0 10.6*  HCT 38.3 31.2*  PLT 214 169  LABPROT 24.3* 27.9*  INR 2.2* 2.6*  CREATININE 2.56* 2.07*     Estimated Creatinine Clearance: 16.9 mL/min (A) (by C-G formula based on SCr of 2.07 mg/dL (H)).   Medications:  Warfarin 4 mg on Wednesdays and Saturdays, 2 mg all other days   Assessment: 81 y.o. female with medical history significant of paroxysmal atrial fibrillation on warfarin, CKD , CHF, T2DM, HTN, HLD and pulmonary fibrosis who was brought into the ED after a fall. Pharmacy has been consulted for warfarin dosing.  Date INR Plan  2/8 2.2 4 mg  2/9 2.6 2 mg    Goal of Therapy:  INR 2-3 Monitor platelets by anticoagulation protocol: Yes   Plan:  --INR therapeutic   --Will give home warfarin dose of 2 mg tonight --Monitor daily INR, CBC, s/s of bleed  Benita Gutter 12/01/2021,8:01 AM

## 2021-12-01 NOTE — Progress Notes (Signed)
1250 Report called to Maddie RN on 1C.  Patient transferred via bed to room 155.

## 2021-12-01 NOTE — Care Management Obs Status (Signed)
[place] NOTIFICATION   Patient Details  Name: JAMILETTE SUCHOCKI MRN: [id number] Date of Birth: 1941/03/23   Medicare Observation Status Notification Given:  Yes    Shelbie Hutching, RN 12/01/2021, 12:00 PM

## 2021-12-01 NOTE — Evaluation (Signed)
Physical Therapy Evaluation Patient Details Name: [name] MRN: [id number] DOB: 20-Dec-1940 Today's Date: 12/01/2021  History of Present Illness  Patient is a 81 y.o. female with medical history significant of paroxysmal atrial fibrillation, CKD stage IV, diastolic congestive heart failure, type 2 diabetes mellitus, hypertension, hyperlipidemia and pulmonary fibrosis who was brought into the ED after a fall.  Left shoulder x-ray showed impacted left middle neck fracture with probable hemarthrosis   Clinical Impression  Patient is agreeable to PT. She was feeding herself breakfast in bed on arrival. She is cooperative and able to follow commands without difficulty. She ambulates with a cane or rolling walker at baseline and has her cane in the room.  She lives with her daughter and has family support as needed.  The patient was able to get out of bed and ambulate in the room with her cane while maintaining NWB of LUE with sling in place. She feels a bit weak with walking but has no loss of balance. Blood pressure 126/58 mmHg after walking with mild dizziness reported with upright activity. Patient was able to maintain NWB of LUE with all functional activity and sling was adjusted for proper placement. Recommend PT follow up to maximize independence and facilitate return to prior level of function. Anticipate patient can return home with HHPT and a little assistance from family.      Recommendations for follow up therapy are one component of a multi-disciplinary discharge planning process, led by the attending physician.  Recommendations may be updated based on patient status, additional functional criteria and insurance authorization.  Follow Up Recommendations Home health PT    Assistance Recommended at Discharge Intermittent Supervision/Assistance  Patient can return home with the following  A little help with walking and/or transfers;A little help with bathing/dressing/bathroom;Assist for  transportation    Equipment Recommendations None recommended by PT  Recommendations for Other Services       Functional Status Assessment Patient has had a recent decline in their functional status and demonstrates the ability to make significant improvements in function in a reasonable and predictable amount of time.     Precautions / Restrictions Precautions Precautions: Fall Required Braces or Orthoses: Sling (LUE) Restrictions Weight Bearing Restrictions: Yes LUE Weight Bearing: Non weight bearing Other Position/Activity Restrictions: conservative tx to L fumeral fx with sling to be donned and NWB      Mobility  Bed Mobility Overal bed mobility: Needs Assistance Bed Mobility: Supine to Sit     Supine to sit: Supervision     General bed mobility comments: increased time required to complete tasks with no physical assistance required. patient able to maintain NWB of LUE with sling in place    Transfers Overall transfer level: Needs assistance Equipment used: Straight cane Transfers: Sit to/from Stand, Bed to chair/wheelchair/BSC Sit to Stand: Min guard   Step pivot transfers: Min guard       General transfer comment: patient is able to maintain NWB of LUE with functiobnal transfers with cues for technique and sling in place. multiple standing bouts performed. blood pressure 126/58 mmHg after standing/walking, Sp02 100% on room air    Ambulation/Gait Ambulation/Gait assistance: Min guard Gait Distance (Feet): 24 Feet Assistive device: Straight cane (patient's own cane used in RUE) Gait Pattern/deviations: Narrow base of support Gait velocity: decreased     General Gait Details: patient reports feeling a bit weak with walking, however no gross loss of balance noted with single point cane. occasional cues for safety. educated patient  to use single point cane at this time instead of rolling walker due to weight bearing and mobility restrictions of LUE  Stairs             Wheelchair Mobility    Modified Rankin (Stroke Patients Only)       Balance Overall balance assessment: Needs assistance Sitting-balance support: Feet unsupported, No upper extremity supported Sitting balance-Leahy Scale: Good Sitting balance - Comments: patient able to maintain sitting balance without support, no UE support     Standing balance-Leahy Scale: Fair Standing balance comment: using cane for support in RUE                             Pertinent Vitals/Pain Pain Assessment Pain Assessment: Faces Faces Pain Scale: Hurts a little bit (0 at rest; 2 with mobility) Pain Location: left upper arm during mobility Pain Descriptors / Indicators: Sore Pain Intervention(s): Limited activity within patient's tolerance (adjusted sling for proper placement and comfort with improved pain)    Home Living Family/patient expects to be discharged to:: Private residence Living Arrangements: Children Available Help at Discharge: Family;Available 24 hours/day Type of Home: Mobile home Home Access: Ramped entrance       Home Layout: One level Home Equipment: [employer] - single Barista (2 wheels)      Prior Function Prior Level of Function : Independent/Modified Independent             Mobility Comments: using SPC and intermittent RW ADLs Comments: patient goes to adult day program M-F. Mod I for ADLs but has supervision as needed from family     Hand Dominance   Dominant Hand: Right    Extremity/Trunk Assessment   Upper Extremity Assessment Upper Extremity Assessment: LUE deficits/detail LUE Deficits / Details: patient is able to activate digit movement LUE: Unable to fully assess due to immobilization (sling in place. did not assess elbow or shoulder ROM) LUE Sensation: WNL    Lower Extremity Assessment Lower Extremity Assessment: RLE deficits/detail;LLE deficits/detail RLE Deficits / Details: endurance imparied for sustained  activity. patient able to perform SLR in supine position RLE Sensation: WNL LLE Deficits / Details: endurance imparied for sustained activity. patient able to perform SLR in supine position LLE Sensation: WNL       Communication   Communication: HOH  Cognition Arousal/Alertness: Awake/alert Behavior During Therapy: WFL for tasks assessed/performed Overall Cognitive Status: Within Functional Limits for tasks assessed                                 General Comments: patient able to follow single step commands without difficulty. she is alert and cooperative during assessment        General Comments General comments (skin integrity, edema, etc.): encouraged patient to be up in chair for upright conditioning and to mobilize in the hopsital with staff assistance for fall prevention    Exercises     Assessment/Plan    PT Assessment Patient needs continued PT services  PT Problem List Decreased activity tolerance;Decreased balance;Decreased mobility;Decreased strength;Pain       PT Treatment Interventions DME instruction;Gait training;Functional mobility training;Therapeutic activities;Therapeutic exercise;Balance training;Patient/family education;Neuromuscular re-education    PT Goals (Current goals can be found in the Care Plan section)  Acute Rehab PT Goals Patient Stated Goal: to return home and to adult day program PT Goal Formulation: With patient Time For Goal Achievement: 12/15/21  Potential to Achieve Goals: Good    Frequency Min 2X/week     Co-evaluation               AM-PAC PT "6 Clicks" Mobility  Outcome Measure Help needed turning from your back to your side while in a flat bed without using bedrails?: A Little Help needed moving from lying on your back to sitting on the side of a flat bed without using bedrails?: A Little Help needed moving to and from a bed to a chair (including a wheelchair)?: A Little Help needed standing up from a  chair using your arms (e.g., wheelchair or bedside chair)?: A Little Help needed to walk in hospital room?: A Little Help needed climbing 3-5 steps with a railing? : A Lot 6 Click Score: 17    End of Session   Activity Tolerance: Patient tolerated treatment well Patient left: in chair;with call bell/phone within reach Nurse Communication: Mobility status PT Visit Diagnosis: Muscle weakness (generalized) (M62.81);Unsteadiness on feet (R26.81)    Time: 5947-0761 PT Time Calculation (min) (ACUTE ONLY): 27 min   Charges:   PT Evaluation $PT Eval Moderate Complexity: 1 Mod PT Treatments $Gait Training: 8-22 mins        Minna Merritts, PT, MPT   Percell Locus 12/01/2021, 9:27 AM

## 2021-12-01 NOTE — Progress Notes (Signed)
Inpatient Diabetes Program Recommendations  AACE/ADA: New Consensus Statement on Inpatient Glycemic Control (2015)  Target Ranges:  Prepandial:   less than 140 mg/dL      Peak postprandial:   less than 180 mg/dL (1-2 hours)      Critically ill patients:  140 - 180 mg/dL    Latest Reference Range & Units 11/30/21 10:27  Glucose 70 - 99 mg/dL 527 (HH)  (HH): Data is critically high  Latest Reference Range & Units 11/30/21 12:26 11/30/21 14:01 11/30/21 16:58 12/01/21 04:43  Glucose-Capillary 70 - 99 mg/dL 440 (H)  10 units Novolog 192 (H) 234 (H)  5 units Novolog  2 units Novolog @2309  189 (H)    Latest Reference Range & Units 03/15/21 15:33 05/24/21 14:44 08/30/21 10:36 11/30/21 10:27  Hemoglobin A1C 4.8 - 5.6 % 6.6 (H) 6.6 (H) 6.3 ! 13.5 (H)  (340 mg/dl)     Admit with:  Mechanical fall/left shoulder fracture Paroxysmal atrial fibrillation with RVR Uncontrolled type 2 diabetes mellitus with hyperglycemia  History: DM2  Home DM Meds: None listed "Patient is not taking any medications.  Per sister, she was told that her diabetes is cured and she does not need medications and she is off her medications for more than a year.  She does not check blood sugar at home."   Current Orders: Novolog Moderate Correction Scale/ SSI (0-15 units) TID AC + HS     Per Chart Review, it appears pt was taking Amaryl, Toujeo Insulin once daily, and Victoza injection once daily back in early 2022.  Doses were Toujeo 6 units QHS, Amaryl 1 mg BID as of Feb 2022.  Looks like Victoza was already stopped in Feb 2022.  A1c has risen from 6.3% (Nov 2022) to 13.5% currently.    Spoke w/ pt this AM at bedside.  Lives with daughter and granddaughter.  Pt admits to eating lots of sweets (cookies, pie and anything else sweet she can get her hands on) and also drinks regular pepsi and water.  We talked about the importance of limiting intake of desserts and completely stopping the habit of drinking  regular soda.  Pt stated she can tolerate diet soda and will try to switch to diet.  Pt does not drive so family bringing in desserts and regular soda.  Pt stated she used to take insulin and Victoza but stopped a long time ago due to low blood sugars.  PCP took her off her DM meds per pt report.  Has CBG meter at home but rarely checks CBGs.  Is open and willing to take insulin again if needed.  Perhaps family can help her with injections if needed.  I have called pt's daughter [name] and left VM asking her to call me so we can review pt's previous diabetes meds and also to discuss likely need for insulin for home when pt discharges.  Dr. Posey Pronto aware.    --Will follow patient during hospitalization--  Wyn Quaker RN, MSN, CDE Diabetes Coordinator Inpatient Glycemic Control Team Team Pager: [phone] (8a-5p)

## 2021-12-02 DIAGNOSIS — E1122 Type 2 diabetes mellitus with diabetic chronic kidney disease: Secondary | ICD-10-CM | POA: Diagnosis not present

## 2021-12-02 DIAGNOSIS — Z7901 Long term (current) use of anticoagulants: Secondary | ICD-10-CM | POA: Diagnosis not present

## 2021-12-02 DIAGNOSIS — E1165 Type 2 diabetes mellitus with hyperglycemia: Secondary | ICD-10-CM | POA: Diagnosis not present

## 2021-12-02 DIAGNOSIS — I10 Essential (primary) hypertension: Secondary | ICD-10-CM | POA: Diagnosis not present

## 2021-12-02 DIAGNOSIS — I4891 Unspecified atrial fibrillation: Secondary | ICD-10-CM | POA: Diagnosis not present

## 2021-12-02 LAB — GLUCOSE, CAPILLARY
Glucose-Capillary: 198 mg/dL — ABNORMAL HIGH (ref 70–99)
Glucose-Capillary: 268 mg/dL — ABNORMAL HIGH (ref 70–99)
Glucose-Capillary: 319 mg/dL — ABNORMAL HIGH (ref 70–99)
Glucose-Capillary: 327 mg/dL — ABNORMAL HIGH (ref 70–99)

## 2021-12-02 LAB — PROTIME-INR
INR: 2.3 — ABNORMAL HIGH (ref 0.8–1.2)
Prothrombin Time: 25.3 seconds — ABNORMAL HIGH (ref 11.4–15.2)

## 2021-12-02 MED ORDER — WARFARIN SODIUM 2 MG PO TABS
2.0000 mg | ORAL_TABLET | ORAL | Status: DC
  Filled 2021-12-02: qty 1

## 2021-12-02 MED ORDER — TORSEMIDE 40 MG PO TABS: 20.0000 mg | ORAL_TABLET | Freq: Every day | ORAL | 0 refills | Status: DC

## 2021-12-02 MED ORDER — LINAGLIPTIN 5 MG PO TABS: 5.0000 mg | ORAL_TABLET | Freq: Every day | ORAL | 0 refills | Status: DC

## 2021-12-02 MED ORDER — WARFARIN SODIUM 4 MG PO TABS: 4.0000 mg | ORAL_TABLET | ORAL | Status: DC

## 2021-12-02 MED ORDER — "PEN NEEDLES 3/16"" 31G X 5 MM MISC": 1.0000 | Freq: Four times a day (QID) | 1 refills | Status: DC

## 2021-12-02 MED ORDER — TRAMADOL HCL 50 MG PO TABS: 50.0000 mg | ORAL_TABLET | Freq: Three times a day (TID) | ORAL | 0 refills | Status: DC | PRN

## 2021-12-02 MED ORDER — WARFARIN SODIUM 4 MG PO TABS: 4.0000 mg | ORAL_TABLET | ORAL | 0 refills | Status: DC

## 2021-12-02 MED ORDER — INSULIN GLARGINE-YFGN 100 UNIT/ML ~~LOC~~ SOLN: 10.0000 [IU] | Freq: Every day | SUBCUTANEOUS | Status: DC

## 2021-12-02 MED ORDER — LEVEMIR FLEXTOUCH 100 UNIT/ML ~~LOC~~ SOPN: 10.0000 [IU] | PEN_INJECTOR | Freq: Every day | SUBCUTANEOUS | 11 refills | Status: DC

## 2021-12-02 MED ORDER — LINAGLIPTIN 5 MG PO TABS
5.0000 mg | ORAL_TABLET | Freq: Every day | ORAL | Status: DC
  Administered 2021-12-02: 5 mg via ORAL
  Filled 2021-12-02: qty 1

## 2021-12-02 NOTE — Progress Notes (Signed)
Occupational Therapy Treatment Patient Details Name: [name] MRN: [id number] DOB: [birth year] Today's Date: 12/02/2021   History of present illness Patient is a 81 y.o. female with medical history significant of paroxysmal atrial fibrillation, CKD stage IV, diastolic congestive heart failure, type 2 diabetes mellitus, hypertension, hyperlipidemia and pulmonary fibrosis who was brought into the ED after a fall.  Left shoulder x-ray showed impacted left middle neck fracture with probable hemarthrosis   OT comments  Upon entering the room, pt supine in bed and agreeable to OT intervention. Pt performs supine >sit without assistance and maintains precautions. Pt stands with min guard and ambulates with SPC to recliner chair. Pt able to verbalize precautions to therapist correctly. OT discussed sling positioning but pt is unable to remove sling without assistance during session. OT demonstrated how to perform UB self care tasks for bathing and dressing while maintaining precautions. Pt did demonstrate simulation with cuing for technique but reports 10/10 pain once UE out of sling. OT called RN for pain medication and repositioned pt in sling with all needs within reach.    Recommendations for follow up therapy are one component of a multi-disciplinary discharge planning process, led by the attending physician.  Recommendations may be updated based on patient status, additional functional criteria and insurance authorization.    Follow Up Recommendations  Home health OT    Assistance Recommended at Discharge Frequent or constant Supervision/Assistance  Patient can return home with the following  A little help with walking and/or transfers;A little help with bathing/dressing/bathroom;Assistance with cooking/housework;Assist for transportation;Help with stairs or ramp for entrance   Equipment Recommendations  BSC/3in1;Tub/shower seat       Precautions / Restrictions Precautions Precautions:  Fall Required Braces or Orthoses: Sling Restrictions Other Position/Activity Restrictions: conservative tx to L fumeral fx with sling to be donned and NWB       Mobility Bed Mobility Overal bed mobility: Needs Assistance, Modified Independent Bed Mobility: Supine to Sit, Sit to Supine     Supine to sit: Modified independent (Device/Increase time) Sit to supine: Modified independent (Device/Increase time)   General bed mobility comments: increased time with HOB elevated but no physical assistance needed.    Transfers Overall transfer level: Needs assistance Equipment used: Straight cane Transfers: Sit to/from Stand Sit to Stand: Min guard                 Balance Overall balance assessment: Needs assistance Sitting-balance support: Feet unsupported, No upper extremity supported Sitting balance-Leahy Scale: Good Sitting balance - Comments: patient able to maintain sitting balance without support, no UE support     Standing balance-Leahy Scale: Fair Standing balance comment: using cane for support in RUE                           ADL either performed or assessed with clinical judgement   ADL Overall ADL's : Needs assistance/impaired                                       General ADL Comments: OT education on UE self care and sling management.     Vision Patient Visual Report: No change from baseline            Cognition Arousal/Alertness: Awake/alert Behavior During Therapy: WFL for tasks assessed/performed Overall Cognitive Status: Within Functional Limits for tasks assessed  Pertinent Vitals/ Pain       Pain Assessment Pain Assessment: 0-10 Pain Score: 10-Worst pain ever Pain Location: left shoulder with sling Pain Descriptors / Indicators: Sore, Discomfort Pain Intervention(s): Limited activity within patient's tolerance, Repositioned, Monitored during  session         Frequency  Min 2X/week        Progress Toward Goals  OT Goals(current goals can now be found in the care plan section)  Progress towards OT goals: Progressing toward goals  Acute Rehab OT Goals Patient Stated Goal: to go home OT Goal Formulation: With patient Time For Goal Achievement: 12/14/21 Potential to Achieve Goals: Good  Plan Discharge plan remains appropriate;Frequency remains appropriate       AM-PAC OT "6 Clicks" Daily Activity     Outcome Measure   Help from another person eating meals?: A Little Help from another person taking care of personal grooming?: A Little Help from another person toileting, which includes using toliet, bedpan, or urinal?: A Lot Help from another person bathing (including washing, rinsing, drying)?: A Lot Help from another person to put on and taking off regular upper body clothing?: A Little Help from another person to put on and taking off regular lower body clothing?: A Lot 6 Click Score: 15    End of Session Equipment Utilized During Treatment: Other (comment) (sling)  OT Visit Diagnosis: Unsteadiness on feet (R26.81);Muscle weakness (generalized) (M62.81);History of falling (Z91.81)   Activity Tolerance Patient limited by pain   Patient Left in bed;with call bell/phone within reach   Nurse Communication Mobility status        Time: 2122-4825 OT Time Calculation (min): 24 min  Charges: OT General Charges $OT Visit: 1 Visit OT Treatments $Self Care/Home Management : 23-37 mins  Darleen Crocker, MS, OTR/L , CBIS ascom [phone]  12/02/21, 12:45 PM

## 2021-12-02 NOTE — Progress Notes (Addendum)
Inpatient Diabetes Program Recommendations  AACE/ADA: New Consensus Statement on Inpatient Glycemic Control   Target Ranges:  Prepandial:   less than 140 mg/dL      Peak postprandial:   less than 180 mg/dL (1-2 hours)      Critically ill patients:  140 - 180 mg/dL    Latest Reference Range & Units 12/02/21 00:15 12/02/21 04:33 12/02/21 07:50  Glucose-Capillary 70 - 99 mg/dL 198 (H) 327 (H) 268 (H)    Latest Reference Range & Units 12/01/21 07:37 12/01/21 11:34 12/01/21 15:32 12/01/21 17:19 12/01/21 20:33  Glucose-Capillary 70 - 99 mg/dL 214 (H) 434 (H) 148 (H) 107 (H) 79    Latest Reference Range & Units 11/30/21 10:27  Glucose 70 - 99 mg/dL 527 (HH)  Hemoglobin A1C 4.8 - 5.6 % 13.5 (H)   Review of Glycemic Control  Diabetes history: DM2 Outpatient Diabetes medications: None; has been on Toujeo, Amaryl, and Victoza in the past Current orders for Inpatient glycemic control: Semglee 6 units daily, Novolog 3 units TID with meals, Novolog 0-15 units TID with meals, Novolog 0-5 units QHS  Inpatient Diabetes Program Recommendations:    Insulin: Please consider increasing Semglee to 10 units daily.  Outpatient DM medications: Please consider discharging patient on basal insulin. May want to also consider oral DM medication as well (in addition to basal insulin); perhaps Tradjenta 5 mg daily (has low risk of hypoglycemia).  Addendum 12/02/21@10 :49-Spoke with patient over the phone regarding plan to discharge on Levemir and Tradjenta and discussed medications. Patient states she feels comfortable with resuming insulin injections outpatient and understands she will need to take the oral DM medication as well. Patient states she is agreeable to taking Levemir and Tradjenta. Patient reports that her daughter helps with CBG monitoring. Informed patient I would call her daughter June to discuss discharge plan for DM as well. Called [employer] [phone]). June states that she is aware of  comfortable with glucose monitoring and insulin administration for her mother. June states that she was checking her mother's glucose before meals when she was taking DM medications in the past. Discussed current A1C of 13.5% indicating an average glucose of 341 mg/dl over the past 2-3 months. Explained that patient will need to be taking DM medications consistently and Dr. Posey Pronto was planning to discharge patient on Levemir and Tradjenta. Discussed Levemir and Tradjenta and how they work. Encouraged June to continue to check CBGs 3-4 times per day. Discussed glucose and A1C goals; explained that since her mother is 81 years old, her PCP may not want her to keep tight glucose control due to risk of hypoglycemia with tight control. Discussed hypoglycemia along with treatment. Encouraged June to reach out to patient's PCP if patient has any issues at all with hypoglycemia so that DM medications could be adjusted. June states she plans to call patient's PCP and make follow up appointment for early next week. Encouraged June to take glucometer or glucose log to appointment so they can review glucose trends and make adjustments with DM medications if needed. June verbalized understanding of information and states she has no questions at this time.  Thanks, Barnie Alderman, RN, MSN, CDE Diabetes Coordinator Inpatient Diabetes Program [phone] (Team Pager from 8am to 5pm)

## 2021-12-02 NOTE — Discharge Instructions (Signed)
Check her sugar at least three times a day keep log of sugars for PCP to review

## 2021-12-02 NOTE — Plan of Care (Signed)
Patient sleeping between care. Aox4. One episode of nausea/vomiting last night. PRN tramadol given for 9/10 left shoulder pain. Plan of care reviewed with patient. Call bell within reach.   PLAN OF CARE ONGOING Problem: Education: Goal: Knowledge of disease or condition will improve Outcome: Progressing Goal: Understanding of medication regimen will improve Outcome: Progressing Goal: Individualized Educational Video(s) Outcome: Progressing   Problem: Activity: Goal: Ability to tolerate increased activity will improve Outcome: Progressing   Problem: Cardiac: Goal: Ability to achieve and maintain adequate cardiopulmonary perfusion will improve Outcome: Progressing   Problem: Health Behavior/Discharge Planning: Goal: Ability to safely manage health-related needs after discharge will improve Outcome: Progressing   Problem: Education: Goal: Knowledge of General Education information will improve Description: Including pain rating scale, medication(s)/side effects and non-pharmacologic comfort measures Outcome: Progressing   Problem: Health Behavior/Discharge Planning: Goal: Ability to manage health-related needs will improve Outcome: Progressing   Problem: Clinical Measurements: Goal: Ability to maintain clinical measurements within normal limits will improve Outcome: Progressing Goal: Will remain free from infection Outcome: Progressing Goal: Diagnostic test results will improve Outcome: Progressing Goal: Respiratory complications will improve Outcome: Progressing Goal: Cardiovascular complication will be avoided Outcome: Progressing   Problem: Activity: Goal: Risk for activity intolerance will decrease Outcome: Progressing   Problem: Nutrition: Goal: Adequate nutrition will be maintained Outcome: Progressing   Problem: Coping: Goal: Level of anxiety will decrease Outcome: Progressing   Problem: Elimination: Goal: Will not experience complications related to bowel  motility Outcome: Progressing Goal: Will not experience complications related to urinary retention Outcome: Progressing   Problem: Pain Managment: Goal: General experience of comfort will improve Outcome: Progressing   Problem: Safety: Goal: Ability to remain free from injury will improve Outcome: Progressing   Problem: Skin Integrity: Goal: Risk for impaired skin integrity will decrease Outcome: Progressing

## 2021-12-02 NOTE — Progress Notes (Signed)
IV removed, cath tip intact. Patient changed into home clothing, ambulated to bathroom X1 assist with cane. Discharge packet and instructions reviewed with patient and patient's daughter, verbalized understanding. No further questions.

## 2021-12-02 NOTE — Discharge Summary (Signed)
Physician Discharge Summary   Patient: [name] MRN: [id number] DOB: May 11, 1941  Admit date:     11/30/2021  Discharge date: 12/02/21  Discharge Physician: Fritzi Mandes   PCP: Lavera Guise, MD   Recommendations at discharge:   patient will follow-up with Dr. Mack Guise orthopedic in 7 to 10 days for left humerus fracture. continue wearing a sling. Keep log of his sugars at home for PCP to review  Discharge Diagnoses:  Left humerus neck fracture status post mechanical fall a fib with RVR resolved type II diabetes with hyperglycemia   Hospital Course: [name] is a 81 y.o. female with medical history significant of paroxysmal atrial fibrillation, CKD stage IV, diastolic congestive heart failure, type 2 diabetes mellitus, hypertension, hyperlipidemia and pulmonary fibrosis who was brought into the ED after a fall.     Left Humerus neck fracture status post mechanical fall -- continue with sling -- patient seen by Dr. Mack Guise recommends follow-up 7 to 10 days as outpatient -- PRN pain meds -- seen by physical therapy occupational therapy--- recommends home health   atrial fibrillation paroxysmal with RVR -- currently in sinus rhythm -- patient not on any rate control medications due to history of bradycardia in the past -- she is on Coumadin with therapeutic INR will continue for now -- pharmacy to manage -- INR therapeutic   type II diabetes, hyperglycemia, uncontrolled -- patient not on any meds at home. -- Diet control -- A1c three months ago 6.3 -- A1c yesterday 13.5-- patient reports noncompliant to diet -- patient was on insulin before. Discussed with diabetes coordinator will start on long-acting and short acting insulin -- above was discussed with patient's daughter [name]. Patient she voiced understanding. She will help patient manage her diabetes including sugar checks and keeping log of it for PCP to adjust meds. -- Start tradjenta 5 mg po qd along  with levemir at d/c   CKD stage 4 with type II diabetes -- creatinine baseline 2.052---2.4   Hypertension -- blood pressure was soft this morning.  -- Patient is on small dose of amlodipine. Continue with holding parameter   chronic diastolic heart failure --Euvolemic -- according to daughter patient takes torsemide at home. I will decrease the dose to 20 mg daily given soft blood pressure   hyponatremia appears due to uncontrolled diabetes -- sodium improved 126--133        Family communication : spoke with daughter [name] this morning Consults : orthopedic CODE STATUS: DNR DVT Prophylaxis : Coumadin Level of care: Telemetry Medical Status is: Observation    discharge home with home health                Consultants: ortho Dr. Mack Guise Procedures performed: none Disposition: Home health Diet recommendation:  Discharge Diet Orders (From admission, onward)     Start     Ordered   12/02/21 0000  Diet Carb Modified        12/02/21 0911           Carb modified diet  DISCHARGE MEDICATION: Allergies as of 12/02/2021   No Known Allergies      Medication List     STOP taking these medications    Blood Pressure Monitor Kit   furosemide 40 MG tablet Commonly known as: LASIX       TAKE these medications    acetaminophen 325 MG tablet Commonly known as: TYLENOL Take 650 mg by mouth every 6 (six) hours as needed.  amLODipine 5 MG tablet Commonly known as: NORVASC Take 1 tablet (5 mg total) by mouth daily.   atorvastatin 10 MG tablet Commonly known as: LIPITOR TAKE 1 TABLET BY MOUTH ONCE DAILY IN THE EVENING   citalopram 20 MG tablet Commonly known as: CELEXA Take 1 tablet (20 mg total) by mouth daily.   cyanocobalamin 1000 MCG tablet Take 1 tablet (1,000 mcg total) by mouth daily.   donepezil 5 MG tablet Commonly known as: ARICEPT Take 1 tablet (5 mg total) by mouth at bedtime. What changed: when to take this   ferrous  sulfate 325 (65 FE) MG tablet Take 1 tablet (325 mg total) by mouth daily with breakfast.   Levemir FlexTouch 100 UNIT/ML FlexPen Generic drug: insulin detemir Inject 10 Units into the skin daily.   linaclotide 145 MCG Caps capsule Commonly known as: Linzess Take 1 capsule (145 mcg total) by mouth daily before breakfast. What changed: when to take this   linagliptin 5 MG Tabs tablet Commonly known as: TRADJENTA Take 1 tablet (5 mg total) by mouth daily.   melatonin 3 MG Tabs tablet Take 3 mg by mouth at bedtime as needed (sleep).   pantoprazole 40 MG tablet Commonly known as: PROTONIX Take 1 tablet by mouth twice daily   Pen Needles 3/16" 31G X 5 MM Misc 1 applicator by Does not apply route 4 (four) times daily.   rOPINIRole 0.5 MG tablet Commonly known as: REQUIP Take 1 tablet (0.5 mg total) by mouth every evening.   Torsemide 40 MG Tabs Take 20 mg by mouth daily. What changed: how much to take   traMADol 50 MG tablet Commonly known as: ULTRAM Take 1 tablet (50 mg total) by mouth every 8 (eight) hours as needed for moderate pain or severe pain. What changed: when to take this   warfarin 2 MG tablet Commonly known as: COUMADIN Take 1 tablet (2 mg total) by mouth daily. Except on wed and sat take 4 mg What changed: Another medication with the same name was changed. Make sure you understand how and when to take each.   warfarin 4 MG tablet Commonly known as: COUMADIN Take 1 tablet (4 mg total) by mouth 2 (two) times a week. On Wednesday and saturday Start taking on: December 05, 2021 What changed:  when to take this additional instructions               Durable Medical Equipment  (From admission, onward)           Start     Ordered   12/01/21 1208  For home use only DME 3 n 1  Once        12/01/21 1208            Follow-up Information     Thornton Park, MD. Schedule an appointment as soon as possible for a visit in 10 day(s).    Specialty: Orthopedic Surgery Why: pt to f/u for left humerus fracture Contact information: [place] [address] [phone]         Lavera Guise, MD. Schedule an appointment as soon as possible for a visit.   Specialties: Internal Medicine, Cardiology Why: hosp f/u for DM-2 Contact information: [place] [address] [phone]                 Discharge Exam: Filed Weights   11/30/21 0936 11/30/21 2245  Weight: 62.1 kg 62.5 kg   GENERAL:  81 y.o.-year-old  patient lying in the bed with no acute distress.  LUNGS: Normal breath sounds bilaterally, no wheezing, rales, rhonchi.  CARDIOVASCULAR: S1, S2 normal. No murmurs, rubs, or gallops.  ABDOMEN: Soft, nontender, nondistended. Bowel sounds present.  EXTREMITIES: No  edema b/l.   Left arm sling+ NEUROLOGIC: nonfocal patient is alert and awake  Condition at discharge: stable  The results of significant diagnostics from this hospitalization (including imaging, microbiology, ancillary and laboratory) are listed below for reference.   Imaging Studies: DG Elbow Complete Left  Result Date: 11/30/2021 CLINICAL DATA:  Humeral fracture, fall EXAM: LEFT ELBOW - COMPLETE 3+ VIEW COMPARISON:  None FINDINGS: Osseous demineralization. Joint spaces preserved. No acute fracture, dislocation, or bone destruction. No elbow joint effusion. IMPRESSION: No acute osseous abnormalities. Electronically Signed   By: Lavonia Dana M.D.   On: 11/30/2021 11:28   CT HEAD WO CONTRAST (5MM)  Result Date: 11/30/2021 CLINICAL DATA:  Head and neck trauma.  Fall. EXAM: CT HEAD WITHOUT CONTRAST CT CERVICAL SPINE WITHOUT CONTRAST TECHNIQUE: Multidetector CT imaging of the head and cervical spine was performed following the standard protocol without intravenous contrast. Multiplanar CT image reconstructions of the cervical spine were also generated. RADIATION DOSE REDUCTION: This exam was performed according to the  departmental dose-optimization program which includes automated exposure control, adjustment of the mA and/or kV according to patient size and/or use of iterative reconstruction technique. COMPARISON:  Cervical spine MRI 12/28/2020. Head CT and head MRI 12/23/2020. FINDINGS: CT HEAD FINDINGS Brain: There is no evidence of an acute infarct, intracranial hemorrhage, mass, midline shift, or extra-axial fluid collection. Mild cerebral atrophy is within normal limits for age. Hypodensities in the cerebral white matter bilaterally are unchanged and nonspecific but compatible with mild chronic small vessel ischemic disease. Vascular: Calcified atherosclerosis at the skull base. No hyperdense vessel. Skull: No acute fracture or suspicious osseous lesion. Sinuses/Orbits: Clear paranasal sinuses. Small chronic bilateral mastoid effusions. Bilateral cataract extraction. Other: None. CT CERVICAL SPINE FINDINGS Alignment: Trace chronic anterolisthesis of C4 on C5 and trace retrolisthesis of C5 on C6. Skull base and vertebrae: No acute fracture or suspicious osseous lesion. Soft tissues and spinal canal: No prevertebral fluid or swelling. No visible canal hematoma. Disc levels: Mild disc degeneration, greatest at C5-6. Multilevel facet arthrosis, focally severe on the right at C4-5. Mild multilevel spinal and neural foraminal stenosis as shown on the prior MRI. Upper chest: No apical lung consolidation or mass. Other: Heavily calcified atherosclerotic plaque at the carotid bifurcations. IMPRESSION: 1. No evidence of acute intracranial abnormality. 2. No evidence of acute cervical spine fracture or traumatic subluxation. Electronically Signed   By: Logan Bores M.D.   On: 11/30/2021 11:56   CT Cervical Spine Wo Contrast  Result Date: 11/30/2021 CLINICAL DATA:  Head and neck trauma.  Fall. EXAM: CT HEAD WITHOUT CONTRAST CT CERVICAL SPINE WITHOUT CONTRAST TECHNIQUE: Multidetector CT imaging of the head and cervical spine was  performed following the standard protocol without intravenous contrast. Multiplanar CT image reconstructions of the cervical spine were also generated. RADIATION DOSE REDUCTION: This exam was performed according to the departmental dose-optimization program which includes automated exposure control, adjustment of the mA and/or kV according to patient size and/or use of iterative reconstruction technique. COMPARISON:  Cervical spine MRI 12/28/2020. Head CT and head MRI 12/23/2020. FINDINGS: CT HEAD FINDINGS Brain: There is no evidence of an acute infarct, intracranial hemorrhage, mass, midline shift, or extra-axial fluid collection. Mild cerebral atrophy is within normal limits for age. Hypodensities in the  cerebral white matter bilaterally are unchanged and nonspecific but compatible with mild chronic small vessel ischemic disease. Vascular: Calcified atherosclerosis at the skull base. No hyperdense vessel. Skull: No acute fracture or suspicious osseous lesion. Sinuses/Orbits: Clear paranasal sinuses. Small chronic bilateral mastoid effusions. Bilateral cataract extraction. Other: None. CT CERVICAL SPINE FINDINGS Alignment: Trace chronic anterolisthesis of C4 on C5 and trace retrolisthesis of C5 on C6. Skull base and vertebrae: No acute fracture or suspicious osseous lesion. Soft tissues and spinal canal: No prevertebral fluid or swelling. No visible canal hematoma. Disc levels: Mild disc degeneration, greatest at C5-6. Multilevel facet arthrosis, focally severe on the right at C4-5. Mild multilevel spinal and neural foraminal stenosis as shown on the prior MRI. Upper chest: No apical lung consolidation or mass. Other: Heavily calcified atherosclerotic plaque at the carotid bifurcations. IMPRESSION: 1. No evidence of acute intracranial abnormality. 2. No evidence of acute cervical spine fracture or traumatic subluxation. Electronically Signed   By: Logan Bores M.D.   On: 11/30/2021 11:56   DG Shoulder  Left  Result Date: 11/30/2021 CLINICAL DATA:  Left shoulder pain, fall, initial encounter. EXAM: LEFT SHOULDER - 2+ VIEW COMPARISON:  None. FINDINGS: There is an impacted fracture of the left humeral neck. Left humeral head is slightly inferiorly displaced, suggesting a hemarthrosis. Widening of the left acromioclavicular joint, of uncertain acuity. Left chest appears grossly unremarkable. IMPRESSION: 1. Impacted left humeral neck fracture with a probable hemarthrosis. 2. Widening of the left acromioclavicular joint, of uncertain acuity. Electronically Signed   By: Lorin Picket M.D.   On: 11/30/2021 10:19    Microbiology: Results for orders placed or performed during the hospital encounter of 11/30/21  Resp Panel by RT-PCR (Flu A&B, Covid) Nasopharyngeal Swab     Status: None   Collection Time: 11/30/21  4:55 PM   Specimen: Nasopharyngeal Swab; Nasopharyngeal(NP) swabs in vial transport medium  Result Value Ref Range Status   SARS Coronavirus 2 by RT PCR NEGATIVE NEGATIVE Final    Comment: (NOTE) SARS-CoV-2 target nucleic acids are NOT DETECTED.  The SARS-CoV-2 RNA is generally detectable in upper respiratory specimens during the acute phase of infection. The lowest concentration of SARS-CoV-2 viral copies this assay can detect is 138 copies/mL. A negative result does not preclude SARS-Cov-2 infection and should not be used as the sole basis for treatment or other patient management decisions. A negative result may occur with  improper specimen collection/handling, submission of specimen other than nasopharyngeal swab, presence of viral mutation(s) within the areas targeted by this assay, and inadequate number of viral copies(<138 copies/mL). A negative result must be combined with clinical observations, patient history, and epidemiological information. The expected result is Negative.  Fact Sheet for Patients:  EntrepreneurPulse.com.au  Fact Sheet for Healthcare  Providers:  IncredibleEmployment.be  This test is no t yet approved or cleared by the [place] FDA and  has been authorized for detection and/or diagnosis of SARS-CoV-2 by FDA under an Emergency Use Authorization (EUA). This EUA will remain  in effect (meaning this test can be used) for the duration of the COVID-19 declaration under Section 564(b)(1) of the Act, 21 U.S.C.section 360bbb-3(b)(1), unless the authorization is terminated  or revoked sooner.       Influenza A by PCR NEGATIVE NEGATIVE Final   Influenza B by PCR NEGATIVE NEGATIVE Final    Comment: (NOTE) The Xpert Xpress SARS-CoV-2/FLU/RSV plus assay is intended as an aid in the diagnosis of influenza from Nasopharyngeal swab specimens and should not be  used as a sole basis for treatment. Nasal washings and aspirates are unacceptable for Xpert Xpress SARS-CoV-2/FLU/RSV testing.  Fact Sheet for Patients: EntrepreneurPulse.com.au  Fact Sheet for Healthcare Providers: IncredibleEmployment.be  This test is not yet approved or cleared by the [place] FDA and has been authorized for detection and/or diagnosis of SARS-CoV-2 by FDA under an Emergency Use Authorization (EUA). This EUA will remain in effect (meaning this test can be used) for the duration of the COVID-19 declaration under Section 564(b)(1) of the Act, 21 U.S.C. section 360bbb-3(b)(1), unless the authorization is terminated or revoked.  Performed at [facility], [address] [place], [place] [address]   MRSA Next Gen by PCR, Nasal     Status: None   Collection Time: 11/30/21 10:52 PM   Specimen: Nasal Mucosa; Nasal Swab  Result Value Ref Range Status   MRSA by PCR Next Gen NOT DETECTED NOT DETECTED Final    Comment: (NOTE) The GeneXpert MRSA Assay (FDA approved for NASAL specimens only), is one component of a comprehensive MRSA colonization surveillance program. It is not  intended to diagnose MRSA infection nor to guide or monitor treatment for MRSA infections. Test performance is not FDA approved in patients less than 44 years old. Performed at [facility], [address] [place], [place] [address]     Labs: CBC: Recent Labs  Lab 11/30/21 1027 12/01/21 0443  WBC 9.3 8.5  NEUTROABS 6.9  --   HGB 13.0 10.6*  HCT 38.3 31.2*  MCV 88.0 88.1  PLT 214 282   Basic Metabolic Panel: Recent Labs  Lab 11/30/21 1027 12/01/21 0443 12/01/21 1254  NA 126* 132*  --   K 4.0 3.6  --   CL 88* 100  --   CO2 28 25  --   GLUCOSE 527* 196* 386*  BUN 37* 33*  --   CREATININE 2.56* 2.07*  --   CALCIUM 8.9 8.3*  --   MG 2.1  --   --    Liver Function Tests: Recent Labs  Lab 12/01/21 0443  AST 19  ALT 12  ALKPHOS 80  BILITOT 0.5  PROT 5.9*  ALBUMIN 2.7*   CBG: Recent Labs  Lab 12/01/21 1719 12/01/21 2033 12/02/21 0015 12/02/21 0433 12/02/21 0750  GLUCAP 107* 79 198* 327* 268*    Discharge time spent: greater than 30 minutes.  Signed: Fritzi Mandes, MD [facility] 12/02/2021

## 2021-12-02 NOTE — TOC Progression Note (Signed)
Transition of Care [facility]) - Progression Note    Patient Details  Name: [name] MRN: [id number] Date of Birth: 12-09-1940  Transition of Care [facility]) CM/SW [place], RN Phone Number: 12/02/2021, 9:57 AM  Clinical Narrative:   Sent follow up to Merleen Nicely with [facility] to verify that they accepted the patient, kelsey confirmed that [facility] did accept the referral, the patient's daughter will provide transportation 3 in 1 delivered by [employer]    Expected Discharge Plan: [place] Barriers to Discharge: Continued Medical Work up  Expected Discharge Plan and Services Expected Discharge Plan: [place]   Discharge Planning Services: CM Consult Post Acute Care Choice: [place] arrangements for the past 2 months: Mobile Home Expected Discharge Date: 12/02/21               DME Arranged: 3-N-1 DME Agency: [employer] Date DME Agency Contacted: 12/01/21 Time DME Agency Contacted: 1215 Representative spoke with at DME Agency: Andee Poles HH Arranged: RN, PT, OT [facility] Agency: [facility] Date [place]: 12/01/21 Time [place]: [id number] Representative spoke with at [place]: [place] ([place]) Interventions    Readmission Risk Interventions Readmission Risk Prevention Plan 06/29/2021 05/27/2021  Transportation Screening Complete Complete  Medication Review Press photographer) Complete Complete  PCP or Specialist appointment within 3-5 days of discharge Complete Complete  HRI or Home Care Consult Complete Complete  SW Recovery Care/Counseling Consult Complete Complete  Palliative Care Screening Not Applicable Complete  [place] Not Applicable Complete  Some recent data might be hidden

## 2021-12-02 NOTE — Consult Note (Signed)
ANTICOAGULATION CONSULT NOTE  Pharmacy Consult for warfarin Indication: atrial fibrillation  Patient Measurements: Height: 4\' 10"  (147.3 cm) Weight: 62.5 kg (137 lb 12.6 oz) IBW/kg (Calculated) : 40.9  Labs: Recent Labs    11/30/21 1027 12/01/21 0443 12/02/21 0634  HGB 13.0 10.6*  --   HCT 38.3 31.2*  --   PLT 214 169  --   LABPROT 24.3* 27.9* 25.3*  INR 2.2* 2.6* 2.3*  CREATININE 2.56* 2.07*  --      Estimated Creatinine Clearance: 16.9 mL/min (A) (by C-G formula based on SCr of 2.07 mg/dL (H)).   Medications:  Warfarin 4 mg on Wednesdays and Saturdays, 2 mg all other days   Assessment: 81 y.o. female with medical history significant of paroxysmal atrial fibrillation on warfarin, CKD , CHF, T2DM, HTN, HLD and pulmonary fibrosis who was brought into the ED after a fall. Pharmacy has been consulted for warfarin dosing.  Date INR Plan  2/8 2.2 4 mg  2/9 2.6 2 mg  2/10 2.3 2 mg    Goal of Therapy:  INR 2-3 Monitor platelets by anticoagulation protocol: Yes   Plan:  --INR therapeutic   --Will continue home warfarin dose of 2 mg tonight --Monitor daily INR, CBC, s/s of bleed  Paulina Fusi, PharmD, BCPS 12/02/2021 10:05 AM

## 2021-12-05 ENCOUNTER — Telehealth: Payer: Self-pay

## 2021-12-05 NOTE — Telephone Encounter (Signed)
Gave verbal order to [facility] soni [phone] for PT 1 tomes a week for 7 weeks

## 2021-12-06 ENCOUNTER — Other Ambulatory Visit: Payer: Self-pay | Admitting: Internal Medicine

## 2021-12-06 ENCOUNTER — Telehealth: Payer: Self-pay

## 2021-12-06 DIAGNOSIS — K219 Gastro-esophageal reflux disease without esophagitis: Secondary | ICD-10-CM

## 2021-12-06 NOTE — Telephone Encounter (Signed)
Gave verbal order to stephanie from [facility] [phone] 1 times a week for 5 weeks

## 2021-12-07 DIAGNOSIS — S42202A Unspecified fracture of upper end of left humerus, initial encounter for closed fracture: Secondary | ICD-10-CM | POA: Diagnosis not present

## 2021-12-09 ENCOUNTER — Encounter: Payer: Self-pay | Admitting: Nurse Practitioner

## 2021-12-09 ENCOUNTER — Ambulatory Visit (INDEPENDENT_AMBULATORY_CARE_PROVIDER_SITE_OTHER): Payer: Medicare HMO

## 2021-12-09 DIAGNOSIS — I480 Paroxysmal atrial fibrillation: Secondary | ICD-10-CM | POA: Diagnosis not present

## 2021-12-09 LAB — PROTIME-INR

## 2021-12-09 NOTE — Progress Notes (Signed)
PT/INR today was 3.3, pt is taking 4 mg warfarin on Mon, Wed, and Fri and 2 mg warfarin on Tues, Thurs, Sat, and Sun.  Per Alyssa there is no changes to pt's medications and she continue the same dosing at this time

## 2021-12-13 ENCOUNTER — Ambulatory Visit
Admission: RE | Admit: 2021-12-13 | Discharge: 2021-12-13 | Disposition: A | Discharge status: Home / Self Care | Payer: Medicare HMO | Attending: Nurse Practitioner | Admitting: Nurse Practitioner

## 2021-12-13 ENCOUNTER — Ambulatory Visit (INDEPENDENT_AMBULATORY_CARE_PROVIDER_SITE_OTHER): Payer: Medicare HMO | Admitting: Nurse Practitioner

## 2021-12-13 ENCOUNTER — Other Ambulatory Visit: Payer: Self-pay

## 2021-12-13 ENCOUNTER — Encounter: Payer: Self-pay | Admitting: Nurse Practitioner

## 2021-12-13 ENCOUNTER — Ambulatory Visit
Admission: RE | Admit: 2021-12-13 | Discharge: 2021-12-13 | Disposition: A | Discharge status: Home / Self Care | Payer: Medicare HMO | Source: Ambulatory Visit | Attending: Nurse Practitioner | Admitting: Nurse Practitioner

## 2021-12-13 VITALS — BP 119/55 | HR 89 | Temp 98.5°F | Resp 16 | Ht <= 58 in | Wt 143.0 lb

## 2021-12-13 DIAGNOSIS — G2581 Restless legs syndrome: Secondary | ICD-10-CM

## 2021-12-13 DIAGNOSIS — I10 Essential (primary) hypertension: Secondary | ICD-10-CM

## 2021-12-13 DIAGNOSIS — E1165 Type 2 diabetes mellitus with hyperglycemia: Secondary | ICD-10-CM | POA: Diagnosis not present

## 2021-12-13 DIAGNOSIS — I480 Paroxysmal atrial fibrillation: Secondary | ICD-10-CM

## 2021-12-13 DIAGNOSIS — R102 Pelvic and perineal pain: Secondary | ICD-10-CM | POA: Diagnosis not present

## 2021-12-13 DIAGNOSIS — I5042 Chronic combined systolic (congestive) and diastolic (congestive) heart failure: Secondary | ICD-10-CM

## 2021-12-13 DIAGNOSIS — F331 Major depressive disorder, recurrent, moderate: Secondary | ICD-10-CM | POA: Diagnosis not present

## 2021-12-13 DIAGNOSIS — S42302S Unspecified fracture of shaft of humerus, left arm, sequela: Secondary | ICD-10-CM

## 2021-12-13 DIAGNOSIS — M150 Primary generalized (osteo)arthritis: Secondary | ICD-10-CM

## 2021-12-13 DIAGNOSIS — E782 Mixed hyperlipidemia: Secondary | ICD-10-CM

## 2021-12-13 DIAGNOSIS — S3992XA Unspecified injury of lower back, initial encounter: Secondary | ICD-10-CM

## 2021-12-13 LAB — POCT GLYCOSYLATED HEMOGLOBIN (HGB A1C): Hemoglobin A1C: 12 % — AB (ref 4.0–5.6)

## 2021-12-13 MED ORDER — ROPINIROLE HCL 0.5 MG PO TABS: 0.5000 mg | ORAL_TABLET | Freq: Every evening | ORAL | 0 refills | Status: DC

## 2021-12-13 MED ORDER — TRAMADOL HCL 50 MG PO TABS: 50.0000 mg | ORAL_TABLET | Freq: Three times a day (TID) | ORAL | 0 refills | Status: DC | PRN

## 2021-12-13 MED ORDER — AMLODIPINE BESYLATE 5 MG PO TABS: 5.0000 mg | ORAL_TABLET | Freq: Every day | ORAL | 3 refills | Status: DC

## 2021-12-13 MED ORDER — LINAGLIPTIN 5 MG PO TABS: 5.0000 mg | ORAL_TABLET | Freq: Every day | ORAL | 0 refills | Status: DC

## 2021-12-13 MED ORDER — ATORVASTATIN CALCIUM 10 MG PO TABS: 10.0000 mg | ORAL_TABLET | Freq: Every evening | ORAL | 1 refills | Status: DC

## 2021-12-13 MED ORDER — CITALOPRAM HYDROBROMIDE 20 MG PO TABS: 20.0000 mg | ORAL_TABLET | Freq: Every day | ORAL | 3 refills | Status: DC

## 2021-12-13 MED ORDER — TORSEMIDE 20 MG PO TABS: 40.0000 mg | ORAL_TABLET | Freq: Every day | ORAL | 1 refills | Status: DC

## 2021-12-13 NOTE — Progress Notes (Unsigned)
[name] [place] [id number] [facility] Discharge Acute Issues Care Follow Up                                                                        Patient Demographics  [name], is a 81 y.o. female  DOB 27-Jun-1941  MRN [id number].  Primary MD  Jonetta Osgood, NP   Reason for TCC follow Up - ***  a1c 12.0   Past Medical History:  Diagnosis Date   Anemia    Atrial fibrillation (HCC)    Chronic kidney disease    Congestive heart failure (CHF) (HCC)    Diabetes mellitus without complication (HCC)    GERD (gastroesophageal reflux disease)    Hyperlipidemia    Hypertension    Pneumonia    Pulmonary fibrosis ([place])     Past Surgical History:  Procedure Laterality Date   ABDOMINAL HYSTERECTOMY     APPENDECTOMY     CATARACT EXTRACTION     CHOLECYSTECTOMY     HERNIA REPAIR     right knee replacement     right nephroectomy         Recent HPI and Hospital Course  ***  [facility] Acute Care Issue to be followed in the Clinic   ***   Subjective:   Roda Shutters today has, No headache, No chest pain, No abdominal pain - No Nausea, No new weakness tingling or numbness, No Cough - SOB. ********  Assessment & Plan    There are no diagnoses linked to this encounter.   Reason for frequent admissions/ER visits **      Objective:   Vitals:   12/13/21 1500  BP: (!) 119/55  Pulse: 89  Resp: 16  Temp: 98.5 F (36.9 C)  SpO2: 95%  Height: 4\' 10"  (1.473 m)    Wt Readings from Last 3 Encounters:  11/30/21 137 lb 12.6 oz (62.5 kg)  11/03/21 141 lb 9.6 oz (64.2 kg)  08/30/21 137 lb (62.1 kg)    Allergies as of 12/13/2021   No Known Allergies      Medication List        Accurate as of December 13, 2021  3:45 PM. If you have any questions, ask your nurse or doctor.          acetaminophen 325 MG  tablet Commonly known as: TYLENOL Take 650 mg by mouth every 6 (six) hours as needed.   amLODipine 5 MG tablet Commonly known as: NORVASC Take 1 tablet (5 mg total) by mouth daily.   atorvastatin 10 MG tablet Commonly known as: LIPITOR Take 1 tablet (10 mg total) by mouth every evening.   citalopram 20 MG tablet Commonly known as: CELEXA Take 1 tablet (20 mg total) by mouth daily.   cyanocobalamin 1000 MCG tablet Take 1 tablet (1,000 mcg total) by mouth daily.   donepezil 5 MG tablet Commonly known as: ARICEPT Take 1 tablet (5 mg total) by mouth at  bedtime. What changed: when to take this   ferrous sulfate 325 (65 FE) MG tablet Take 1 tablet (325 mg total) by mouth daily with breakfast.   Levemir FlexTouch 100 UNIT/ML FlexPen Generic drug: insulin detemir Inject 10 Units into the skin daily.   linaclotide 145 MCG Caps capsule Commonly known as: Linzess Take 1 capsule (145 mcg total) by mouth daily before breakfast. What changed: when to take this   linagliptin 5 MG Tabs tablet Commonly known as: TRADJENTA Take 1 tablet (5 mg total) by mouth daily.   melatonin 3 MG Tabs tablet Take 3 mg by mouth at bedtime as needed (sleep).   pantoprazole 40 MG tablet Commonly known as: PROTONIX Take 1 tablet by mouth twice daily   Pen Needles 3/16" 31G X 5 MM Misc 1 applicator by Does not apply route 4 (four) times daily.   rOPINIRole 0.5 MG tablet Commonly known as: REQUIP Take 1 tablet (0.5 mg total) by mouth every evening.   torsemide 20 MG tablet Commonly known as: DEMADEX Take 2 tablets (40 mg total) by mouth daily. What changed:  medication strength how much to take Changed by: Jonetta Osgood, NP   traMADol 50 MG tablet Commonly known as: ULTRAM Take 1 tablet (50 mg total) by mouth every 8 (eight) hours as needed for moderate pain or severe pain.   warfarin 2 MG tablet Commonly known as: COUMADIN Take 1 tablet (2 mg total) by mouth daily. Except on wed and  sat take 4 mg   warfarin 4 MG tablet Commonly known as: COUMADIN Take 1 tablet (4 mg total) by mouth 2 (two) times a week. On Wednesday and saturday         Physical Exam: Constitutional: Patient appears well-developed and well-nourished. Not in obvious distress. HENT: Normocephalic, atraumatic, External right and left ear normal. Oropharynx is clear and moist.  Eyes: Conjunctivae and EOM are normal. PERRLA, no scleral icterus. Neck: Normal ROM. Neck supple. No JVD. No tracheal deviation. No thyromegaly. CVS: RRR, S1/S2 +, no murmurs, no gallops, no carotid bruit.  Pulmonary: Effort and breath sounds normal, no stridor, rhonchi, wheezes, rales.  Abdominal: Soft. BS +, no distension, tenderness, rebound or guarding.  Musculoskeletal: Normal range of motion. No edema and no tenderness.  Lymphadenopathy: No lymphadenopathy noted, cervical, inguinal or axillary Neuro: Alert. Normal reflexes, muscle tone coordination. No cranial nerve deficit. Skin: Skin is warm and dry. No rash noted. Not diaphoretic. No erythema. No pallor. Psychiatric: Normal mood and affect. Behavior, judgment, thought content normal.   Data Review   Micro Results No results found for this or any previous visit (from the past 240 hour(s)).   CBC No results for input(s): WBC, HGB, HCT, PLT, MCV, MCH, MCHC, RDW, LYMPHSABS, MONOABS, EOSABS, BASOSABS, BANDABS in the last 168 hours.  Invalid input(s): NEUTRABS, BANDSABD  Chemistries  No results for input(s): NA, K, CL, CO2, GLUCOSE, BUN, CREATININE, CALCIUM, MG, AST, ALT, ALKPHOS, BILITOT in the last 168 hours.  Invalid input(s): GFRCGP ------------------------------------------------------------------------------------------------------------------ estimated creatinine clearance is 16.9 mL/min (A) (by C-G formula based on SCr of 2.07 mg/dL (H)). ------------------------------------------------------------------------------------------------------------------ No  results for input(s): HGBA1C in the last 72 hours. ------------------------------------------------------------------------------------------------------------------ No results for input(s): CHOL, HDL, LDLCALC, TRIG, CHOLHDL, LDLDIRECT in the last 72 hours. ------------------------------------------------------------------------------------------------------------------ No results for input(s): TSH, T4TOTAL, T3FREE, THYROIDAB in the last 72 hours.  Invalid input(s): FREET3 ------------------------------------------------------------------------------------------------------------------ No results for input(s): VITAMINB12, FOLATE, FERRITIN, TIBC, IRON, RETICCTPCT in the last 72 hours.  Coagulation profile No results for  input(s): INR, PROTIME in the last 168 hours.  No results for input(s): DDIMER in the last 72 hours.  Cardiac Enzymes No results for input(s): CKMB, TROPONINI, MYOGLOBIN in the last 168 hours.  Invalid input(s): CK ------------------------------------------------------------------------------------------------------------------ Invalid input(s): POCBNP   Time Spent in minutes  63 ***   Jonetta Osgood M.D on 12/13/2021 at 3:45 PM   **Disclaimer: This note may have been dictated with voice recognition software. Similar sounding words can inadvertently be transcribed and this note may contain transcription errors which may not have been corrected upon publication of note.**

## 2021-12-15 ENCOUNTER — Telehealth: Payer: Self-pay

## 2021-12-15 NOTE — Telephone Encounter (Signed)
Pt's daughter [name] called and was requesting results of xray, per Alyssa xray was normal

## 2021-12-22 ENCOUNTER — Ambulatory Visit: Payer: Medicare HMO | Admitting: Podiatry

## 2021-12-22 ENCOUNTER — Ambulatory Visit: Payer: Medicare HMO | Admitting: Nurse Practitioner

## 2021-12-22 ENCOUNTER — Telehealth: Payer: Self-pay

## 2021-12-22 ENCOUNTER — Encounter: Payer: Self-pay | Admitting: Podiatry

## 2021-12-22 ENCOUNTER — Other Ambulatory Visit: Payer: Self-pay

## 2021-12-22 DIAGNOSIS — M79674 Pain in right toe(s): Secondary | ICD-10-CM

## 2021-12-22 DIAGNOSIS — Z794 Long term (current) use of insulin: Secondary | ICD-10-CM | POA: Diagnosis not present

## 2021-12-22 DIAGNOSIS — B351 Tinea unguium: Secondary | ICD-10-CM | POA: Diagnosis not present

## 2021-12-22 DIAGNOSIS — N184 Chronic kidney disease, stage 4 (severe): Secondary | ICD-10-CM | POA: Diagnosis not present

## 2021-12-22 DIAGNOSIS — M79675 Pain in left toe(s): Secondary | ICD-10-CM

## 2021-12-22 DIAGNOSIS — E1149 Type 2 diabetes mellitus with other diabetic neurological complication: Secondary | ICD-10-CM

## 2021-12-23 NOTE — Telephone Encounter (Signed)
[name] that letter ready for pickup at front desk ?

## 2021-12-23 NOTE — Progress Notes (Signed)
This patient returns to my office for at risk foot care.  This patient requires this care by a professional since this patient will be at risk due to having coagulation defect. CKD amd DM with kidney disease. She has severe pain inside border left big toe.  She presents to the office with her daughter. this patient is unable to cut nails herself since the patient cannot reach her nails.Patient has not been seen in over one year.These nails are painful walking and wearing shoes.  This patient presents for at risk foot care today. ? ?General Appearance  Alert, conversant and in no acute stress. ? ?Vascular  Dorsalis pedis and posterior tibial  pulses are not  palpable  bilaterally.  Capillary return is within normal limits  bilaterally. Cold feetbilaterally. ? ?Neurologic  Senn-Weinstein monofilament wire test within normal limits  bilaterally. Muscle power within normal limits bilaterally. ? ?Nails Thick disfigured discolored nails with subungual debris  from hallux to fifth toes bilaterally. No evidence of bacterial infection or drainage bilaterally. Marked incurvation medial border  with severe pain upon palpation. ? ?Orthopedic  No limitations of motion  feet .  No crepitus or effusions noted.  No bony pathology or digital deformities noted. ? ?Skin  normotropic skin with no porokeratosis noted bilaterally.  No signs of infections or ulcers noted.    ? ?Onychomycosis  Pain in right toes  Pain in left toes Ingrown nail left hallux. ? ?Consent was obtained for treatment procedures.   Mechanical debridement of nails 1-5  bilaterally performed with a nail nipper.  Filed with dremel without incident.  ? ? ?Return office visit   4 months                  Told patient to return for periodic foot care and evaluation due to potential at risk complications. ? ? ?Gardiner Barefoot DPM   ?

## 2021-12-26 DIAGNOSIS — S42202A Unspecified fracture of upper end of left humerus, initial encounter for closed fracture: Secondary | ICD-10-CM | POA: Diagnosis not present

## 2022-01-03 ENCOUNTER — Other Ambulatory Visit: Payer: Self-pay

## 2022-01-03 ENCOUNTER — Telehealth: Payer: Self-pay

## 2022-01-03 MED ORDER — NEOMYCIN-POLYMYXIN-HC 3.5-10000-1 OP SUSP: 2.0000 [drp] | Freq: Two times a day (BID) | OPHTHALMIC | 0 refills | Status: DC

## 2022-01-03 NOTE — Telephone Encounter (Signed)
Pt called that pt had pink eye as per dr Humphrey Rolls sed cortisporin eye drops and make her appt  ?

## 2022-01-04 ENCOUNTER — Ambulatory Visit: Payer: Medicare HMO | Admitting: Nurse Practitioner

## 2022-01-05 ENCOUNTER — Ambulatory Visit (INDEPENDENT_AMBULATORY_CARE_PROVIDER_SITE_OTHER): Payer: Medicare HMO | Admitting: Nurse Practitioner

## 2022-01-05 ENCOUNTER — Other Ambulatory Visit: Payer: Self-pay

## 2022-01-05 ENCOUNTER — Telehealth: Payer: Self-pay | Admitting: Nurse Practitioner

## 2022-01-05 ENCOUNTER — Encounter: Payer: Self-pay | Admitting: Nurse Practitioner

## 2022-01-05 VITALS — BP 112/60 | HR 75 | Temp 98.4°F | Resp 16 | Ht <= 58 in | Wt 143.0 lb

## 2022-01-05 DIAGNOSIS — M62838 Other muscle spasm: Secondary | ICD-10-CM

## 2022-01-05 DIAGNOSIS — H109 Unspecified conjunctivitis: Secondary | ICD-10-CM

## 2022-01-05 DIAGNOSIS — M150 Primary generalized (osteo)arthritis: Secondary | ICD-10-CM | POA: Diagnosis not present

## 2022-01-05 MED ORDER — CYCLOBENZAPRINE HCL 5 MG PO TABS: 5.0000 mg | ORAL_TABLET | Freq: Every evening | ORAL | 0 refills | Status: DC | PRN

## 2022-01-05 NOTE — Progress Notes (Signed)
[facility] ?[address] ?[place], [place] [address] ? ?Internal MEDICINE  ?Office Visit Note ? ?Patient Name: [name] ? [id number]  ?[id number] ? ?Date of Service: 01/05/2022 ? ?Chief Complaint  ?Patient presents with  ? Conjunctivitis  ? ? ? ?HPI ?[name] presents for an acute sick visit for pinkeye conjunctivitis of the right eye.  Dr. Clayborn Bigness prescribed eyedrops to help resolve the infection but evident unable to get the eyedrops yet due to the pharmacy not having them in stock and having ordered them.  [name] reported that the little red eye is improving some. ?She also is having muscle spasms in the left leg due to her physical therapist working her too hard on that leg. ? ? ? ? ?Current Medication: ? ?Outpatient Encounter Medications as of 01/05/2022  ?Medication Sig  ? acetaminophen (TYLENOL) 325 MG tablet Take 650 mg by mouth every 6 (six) hours as needed.  ? amLODipine (NORVASC) 5 MG tablet Take 1 tablet (5 mg total) by mouth daily.  ? atorvastatin (LIPITOR) 10 MG tablet Take 1 tablet (10 mg total) by mouth every evening.  ? citalopram (CELEXA) 20 MG tablet Take 1 tablet (20 mg total) by mouth daily.  ? donepezil (ARICEPT) 5 MG tablet Take 1 tablet (5 mg total) by mouth at bedtime. (Patient taking differently: Take 5 mg by mouth in the morning.)  ? ferrous sulfate 325 (65 FE) MG tablet Take 1 tablet (325 mg total) by mouth daily with breakfast.  ? insulin detemir (LEVEMIR FLEXTOUCH) 100 UNIT/ML FlexPen Inject 10 Units into the skin daily.  ? Insulin Pen Needle (PEN NEEDLES 3/16") 31G X 5 MM MISC 1 applicator by Does not apply route 4 (four) times daily.  ? linaclotide (LINZESS) 145 MCG CAPS capsule Take 1 capsule (145 mcg total) by mouth daily before breakfast. (Patient taking differently: Take 145 mcg by mouth at bedtime.)  ? linagliptin (TRADJENTA) 5 MG TABS tablet Take 1 tablet (5 mg total) by mouth daily.  ? melatonin 3 MG TABS tablet Take 3 mg by mouth at bedtime as needed  (sleep).  ? neomycin-polymyxin-hydrocortisone (CORTISPORIN) 3.5-10000-1 ophthalmic suspension Place 2 drops into both eyes in the morning and at bedtime.  ? pantoprazole (PROTONIX) 40 MG tablet Take 1 tablet by mouth twice daily  ? rOPINIRole (REQUIP) 0.5 MG tablet Take 1 tablet (0.5 mg total) by mouth every evening.  ? torsemide (DEMADEX) 20 MG tablet Take 2 tablets (40 mg total) by mouth daily.  ? traMADol (ULTRAM) 50 MG tablet Take 1 tablet (50 mg total) by mouth every 8 (eight) hours as needed for moderate pain or severe pain.  ? vitamin B-12 1000 MCG tablet Take 1 tablet (1,000 mcg total) by mouth daily.  ? [DISCONTINUED] cyclobenzaprine (FLEXERIL) 5 MG tablet Take 1-2 tablets (5-10 mg total) by mouth at bedtime as needed for muscle spasms.  ? [DISCONTINUED] warfarin (COUMADIN) 2 MG tablet Take 1 tablet (2 mg total) by mouth daily. Except on wed and sat take 4 mg  ? [DISCONTINUED] warfarin (COUMADIN) 4 MG tablet Take 1 tablet (4 mg total) by mouth 2 (two) times a week. On Wednesday and saturday  ? ?No facility-administered encounter medications on file as of 01/05/2022.  ? ? ? ? ?Medical History: ?Past Medical History:  ?Diagnosis Date  ? Anemia   ? Atrial fibrillation ([place])   ? Chronic kidney disease   ? Congestive heart failure (CHF) ([place])   ? Diabetes mellitus without complication ([place])   ?  GERD (gastroesophageal reflux disease)   ? Hyperlipidemia   ? Hypertension   ? Pneumonia   ? Pulmonary fibrosis ([place])   ? ? ? ?Vital Signs: ?BP 112/60   Pulse 75   Temp 98.4 ?F (36.9 ?C)   Resp 16   Ht '4\' 10"'$  (1.473 m)   SpO2 96%   BMI 29.89 kg/m?  ? ? ?Review of Systems  ?Constitutional:  Negative for chills, fatigue and unexpected weight change.  ?HENT:  Negative for congestion, rhinorrhea, sneezing and sore throat.   ?Eyes:  Positive for discharge, redness and itching.  ?Respiratory: Negative.  Negative for cough, chest tightness, shortness of breath and wheezing.   ?Cardiovascular: Negative.  Negative for chest  pain and palpitations.  ?Gastrointestinal: Negative.  Negative for abdominal pain, constipation, diarrhea, nausea and vomiting.  ?Genitourinary:  Negative for dysuria and frequency.  ?Musculoskeletal:  Positive for myalgias (left leg muscle spasms after working hard in physical therapy.). Negative for arthralgias, back pain, joint swelling and neck pain.  ?Skin:  Negative for rash.  ?Neurological: Negative.  Negative for tremors and numbness.  ?Hematological:  Negative for adenopathy. Does not bruise/bleed easily.  ?Psychiatric/Behavioral:  Negative for behavioral problems (Depression), sleep disturbance and suicidal ideas. The patient is not nervous/anxious.   ? ?Physical Exam ?Vitals reviewed.  ?Constitutional:   ?   General: She is not in acute distress. ?   Appearance: Normal appearance. She is normal weight. She is not ill-appearing.  ?HENT:  ?   Head: Normocephalic and atraumatic.  ?Eyes:  ?   General: Lids are normal. Vision grossly intact. Gaze aligned appropriately.     ?   Right eye: Discharge (clear) present.  ?   Conjunctiva/sclera:  ?   Right eye: Right conjunctiva is injected.  ?   Comments: Injected sclera of right eye  ?Cardiovascular:  ?   Rate and Rhythm: Normal rate and regular rhythm.  ?Pulmonary:  ?   Effort: Pulmonary effort is normal. No respiratory distress.  ?Neurological:  ?   Mental Status: She is alert and oriented to person, place, and time.  ?Psychiatric:     ?   Mood and Affect: Mood normal.     ?   Behavior: Behavior normal.  ? ? ? ? ?Assessment/Plan: ?1. Muscle spasm of left lower extremity ?Cyclobenzaprine prescribed for the patient to take at night.  ? ?2. Primary generalized (osteo)arthritis ?May take tylenol to alleviate pain. Cyclobenzaprine prescribed to alleviate spasms.  ? ?3. Bacterial conjunctivitis of right eye ?Eye drops previously prescribed, she should be getting them from her pharmacy today or tomorrow and then will administer as prescribed.  ? ? ?General Counseling:  [name] verbalizes understanding of the findings of todays visit and agrees with plan of treatment. I have discussed any further diagnostic evaluation that may be needed or ordered today. We also reviewed her medications today. she has been encouraged to call the office with any questions or concerns that should arise related to todays visit. ? ? ? ?Counseling: ? ? ? ?No orders of the defined types were placed in this encounter. ? ? ?Meds ordered this encounter  ?Medications  ? DISCONTD: cyclobenzaprine (FLEXERIL) 5 MG tablet  ?  Sig: Take 1-2 tablets (5-10 mg total) by mouth at bedtime as needed for muscle spasms.  ?  Dispense:  30 tablet  ?  Refill:  0  ? ? ?Return if symptoms worsen or fail to improve. ? ?[place] Controlled Substance Database was reviewed by me for  overdose risk score (ORS) ? ?Time spent:30 Minutes ?Time spent with patient included reviewing progress notes, labs, imaging studies, and discussing plan for follow up.  ? ?This patient was seen by Jonetta Osgood, FNP-C in collaboration with Dr. Clayborn Bigness as a part of collaborative care agreement. ? ?Amna Welker R. Valetta Fuller, MSN, FNP-C ?Internal Medicine ?

## 2022-01-05 NOTE — Chronic Care Management (AMB) (Signed)
?  Chronic Care Management  ? ?Note ? ?01/05/2022 ?Name: [name] MRN: [id number] DOB: 10/14/1941 ? ?[name] is a 81 y.o. year old female who is a primary care patient of Jonetta Osgood, NP. I reached out to [name] by phone today in response to a referral sent by Ms. Shalynn A Bulkley's PCP, Jonetta Osgood, NP.  ? ?[name] was given information about Chronic Care Management services today including:  ?CCM service includes personalized support from designated clinical staff supervised by her physician, including individualized plan of care and coordination with other care providers ?24/7 contact phone numbers for assistance for urgent and routine care needs. ?Service will only be billed when office clinical staff spend 20 minutes or more in a month to coordinate care. ?Only one practitioner may furnish and bill the service in a calendar month. ?The patient may stop CCM services at any time (effective at the end of the month) by phone call to the office staff. ? ? ?[name]  DAUGHTER verbally agreed to assistance and services provided by embedded care coordination/care management team today. ? ?Follow up plan: ? ? ?[name] ?[employer] Scheduler  ?

## 2022-01-06 ENCOUNTER — Telehealth: Payer: Self-pay

## 2022-01-06 NOTE — Telephone Encounter (Signed)
error 

## 2022-01-06 NOTE — Telephone Encounter (Signed)
Colletta [place] ([place] called and requested verbal order for  ?OT 1 x week for 4 weeks ?I gave her the approval for the order. ?

## 2022-01-09 ENCOUNTER — Encounter: Payer: Self-pay | Admitting: Nurse Practitioner

## 2022-01-09 ENCOUNTER — Emergency Department
Admission: EM | Admit: 2022-01-09 | Discharge: 2022-01-09 | Disposition: A | Discharge status: Home / Self Care | Payer: Medicare HMO | Attending: Emergency Medicine | Admitting: Emergency Medicine

## 2022-01-09 ENCOUNTER — Emergency Department: Payer: Medicare HMO

## 2022-01-09 ENCOUNTER — Ambulatory Visit (INDEPENDENT_AMBULATORY_CARE_PROVIDER_SITE_OTHER): Payer: Medicare HMO

## 2022-01-09 ENCOUNTER — Other Ambulatory Visit: Payer: Self-pay

## 2022-01-09 ENCOUNTER — Encounter: Payer: Self-pay | Admitting: Emergency Medicine

## 2022-01-09 DIAGNOSIS — N189 Chronic kidney disease, unspecified: Secondary | ICD-10-CM | POA: Diagnosis not present

## 2022-01-09 DIAGNOSIS — E119 Type 2 diabetes mellitus without complications: Secondary | ICD-10-CM | POA: Diagnosis not present

## 2022-01-09 DIAGNOSIS — I4891 Unspecified atrial fibrillation: Secondary | ICD-10-CM | POA: Diagnosis not present

## 2022-01-09 DIAGNOSIS — Z79899 Other long term (current) drug therapy: Secondary | ICD-10-CM | POA: Insufficient documentation

## 2022-01-09 DIAGNOSIS — Z794 Long term (current) use of insulin: Secondary | ICD-10-CM | POA: Diagnosis not present

## 2022-01-09 DIAGNOSIS — W19XXXA Unspecified fall, initial encounter: Secondary | ICD-10-CM | POA: Diagnosis not present

## 2022-01-09 DIAGNOSIS — Z96651 Presence of right artificial knee joint: Secondary | ICD-10-CM | POA: Diagnosis not present

## 2022-01-09 DIAGNOSIS — I130 Hypertensive heart and chronic kidney disease with heart failure and stage 1 through stage 4 chronic kidney disease, or unspecified chronic kidney disease: Secondary | ICD-10-CM | POA: Diagnosis not present

## 2022-01-09 DIAGNOSIS — M25562 Pain in left knee: Secondary | ICD-10-CM | POA: Diagnosis not present

## 2022-01-09 DIAGNOSIS — M79605 Pain in left leg: Secondary | ICD-10-CM | POA: Diagnosis not present

## 2022-01-09 DIAGNOSIS — M79652 Pain in left thigh: Secondary | ICD-10-CM | POA: Diagnosis not present

## 2022-01-09 DIAGNOSIS — I509 Heart failure, unspecified: Secondary | ICD-10-CM | POA: Insufficient documentation

## 2022-01-09 DIAGNOSIS — Z7901 Long term (current) use of anticoagulants: Secondary | ICD-10-CM | POA: Insufficient documentation

## 2022-01-09 LAB — PROTIME-INR

## 2022-01-09 MED ORDER — OXYCODONE-ACETAMINOPHEN 5-325 MG PO TABS
1.0000 | ORAL_TABLET | Freq: Once | ORAL | Status: AC
  Administered 2022-01-09: 1 via ORAL
  Filled 2022-01-09: qty 1

## 2022-01-09 MED ORDER — ONDANSETRON 4 MG PO TBDP: 4.0000 mg | ORAL_TABLET | Freq: Four times a day (QID) | ORAL | 0 refills | Status: DC | PRN

## 2022-01-09 MED ORDER — OXYCODONE-ACETAMINOPHEN 5-325 MG PO TABS: 1.0000 | ORAL_TABLET | Freq: Four times a day (QID) | ORAL | 0 refills | Status: DC | PRN

## 2022-01-09 MED ORDER — ONDANSETRON 4 MG PO TBDP
4.0000 mg | ORAL_TABLET | Freq: Once | ORAL | Status: AC
  Administered 2022-01-09: 4 mg via ORAL
  Filled 2022-01-09: qty 1

## 2022-01-09 NOTE — ED Triage Notes (Addendum)
Pt to ED via POV with c/o left upper leg pain. She has been doing PT after a fall, she told the therapist that it was hurting and he told her to keep pushing and try harder. She is now unable to lift her leg and the pain shoots up into her groin. She needs help with moving her leg because it hurts so bad. She was seen by PMD and they gave her muscle relaxer's and Tramadol are not helping with the pain  ?

## 2022-01-09 NOTE — Progress Notes (Signed)
Pt INR 2.0 as per alyssa no change continue same med as prescribed  ?

## 2022-01-09 NOTE — ED Provider Notes (Signed)
? ?[facility] ?Provider Note ? ? ? Event Date/Time  ? First MD Initiated Contact with Patient 01/09/22 1750   ?  (approximate) ? ? ?History  ? ?Leg Pain ? ? ?HPI ? ?[name] is a 81 y.o. female with history of atrial fibrillation on Coumadin, hypertension, diabetes, hyperlipidemia, chronic kidney disease, CHF who presents to the emergency department with her daughter with complaints of left leg pain.  She denies any known injury but states that she is currently working with physical therapy and states she thinks that they pushed her too hard today.  She states there is pain with any movement of the left hip and left knee.  No calf tenderness or calf swelling.  No numbness, tingling.  She normally ambulates with a cane.  She is in a sling for a left humeral neck fracture that occurred in February 2023.  She is taking tramadol and a muscle relaxer without any relief in her left leg pain. ? ?History provided by patient and daughter. ? ? ? ?Past Medical History:  ?Diagnosis Date  ? Anemia   ? Atrial fibrillation ([place])   ? Chronic kidney disease   ? Congestive heart failure (CHF) ([place])   ? Diabetes mellitus without complication ([place])   ? GERD (gastroesophageal reflux disease)   ? Hyperlipidemia   ? Hypertension   ? Pneumonia   ? Pulmonary fibrosis ([place])   ? ? ?Past Surgical History:  ?Procedure Laterality Date  ? ABDOMINAL HYSTERECTOMY    ? APPENDECTOMY    ? CATARACT EXTRACTION    ? CHOLECYSTECTOMY    ? HERNIA REPAIR    ? right knee replacement    ? right nephroectomy    ? ? ?MEDICATIONS:  ?Prior to Admission medications   ?Medication Sig Start Date End Date Taking? Authorizing Provider  ?acetaminophen (TYLENOL) 325 MG tablet Take 650 mg by mouth every 6 (six) hours as needed.    [provider]  ?amLODipine (NORVASC) 5 MG tablet Take 1 tablet (5 mg total) by mouth daily. 12/13/21   Jonetta Osgood, NP  ?atorvastatin (LIPITOR) 10 MG tablet Take 1 tablet (10 mg total) by mouth every  evening. 12/13/21   Jonetta Osgood, NP  ?citalopram (CELEXA) 20 MG tablet Take 1 tablet (20 mg total) by mouth daily. 12/13/21   Jonetta Osgood, NP  ?cyclobenzaprine (FLEXERIL) 5 MG tablet Take 1-2 tablets (5-10 mg total) by mouth at bedtime as needed for muscle spasms. 01/05/22   Jonetta Osgood, NP  ?donepezil (ARICEPT) 5 MG tablet Take 1 tablet (5 mg total) by mouth at bedtime. ?Patient taking differently: Take 5 mg by mouth in the morning. 11/03/21   Jonetta Osgood, NP  ?ferrous sulfate 325 (65 FE) MG tablet Take 1 tablet (325 mg total) by mouth daily with breakfast. 12/30/20   Val Riles, MD  ?insulin detemir (LEVEMIR FLEXTOUCH) 100 UNIT/ML FlexPen Inject 10 Units into the skin daily. 12/02/21   Fritzi Mandes, MD  ?Insulin Pen Needle (PEN NEEDLES 3/16") 31G X 5 MM MISC 1 applicator by Does not apply route 4 (four) times daily. 12/02/21   Fritzi Mandes, MD  ?linaclotide Rolan Lipa) 145 MCG CAPS capsule Take 1 capsule (145 mcg total) by mouth daily before breakfast. ?Patient taking differently: Take 145 mcg by mouth at bedtime. 11/03/21   Jonetta Osgood, NP  ?linagliptin (TRADJENTA) 5 MG TABS tablet Take 1 tablet (5 mg total) by mouth daily. 12/13/21   Jonetta Osgood, NP  ?melatonin 3 MG TABS tablet Take  3 mg by mouth at bedtime as needed (sleep).    [provider]  ?neomycin-polymyxin-hydrocortisone (CORTISPORIN) 3.5-10000-1 ophthalmic suspension Place 2 drops into both eyes in the morning and at bedtime. 01/03/22   Lavera Guise, MD  ?pantoprazole (PROTONIX) 40 MG tablet Take 1 tablet by mouth twice daily 12/06/21   Lavera Guise, MD  ?rOPINIRole (REQUIP) 0.5 MG tablet Take 1 tablet (0.5 mg total) by mouth every evening. 12/13/21   Jonetta Osgood, NP  ?torsemide (DEMADEX) 20 MG tablet Take 2 tablets (40 mg total) by mouth daily. 12/13/21   Jonetta Osgood, NP  ?traMADol (ULTRAM) 50 MG tablet Take 1 tablet (50 mg total) by mouth every 8 (eight) hours as needed for moderate pain or severe pain.  12/13/21   Jonetta Osgood, NP  ?vitamin B-12 1000 MCG tablet Take 1 tablet (1,000 mcg total) by mouth daily. 12/30/20   Val Riles, MD  ?warfarin (COUMADIN) 2 MG tablet Take 1 tablet (2 mg total) by mouth daily. Except on wed and sat take 4 mg 05/04/21   Lavera Guise, MD  ?warfarin (COUMADIN) 4 MG tablet Take 1 tablet (4 mg total) by mouth 2 (two) times a week. On Wednesday and saturday 12/05/21   Fritzi Mandes, MD  ? ? ?Physical Exam  ? ?Triage Vital Signs: ?ED Triage Vitals  ?Enc Vitals Group  ?   BP 01/09/22 1738 122/80  ?   Pulse Rate 01/09/22 1738 100  ?   Resp 01/09/22 1738 16  ?   Temp 01/09/22 1738 98 ?F (36.7 ?C)  ?   Temp Source 01/09/22 1738 Oral  ?   SpO2 01/09/22 1738 96 %  ?   Weight 01/09/22 1739 143 lb (64.9 kg)  ?   Height 01/09/22 1739 '4\' 10"'$  (1.473 m)  ?   Head Circumference --   ?   Peak Flow --   ?   Pain Score 01/09/22 1739 10  ?   Pain Loc --   ?   Pain Edu? --   ?   Excl. in [place]? --   ? ? ?Most recent vital signs: ?Vitals:  ? 01/09/22 1738  ?BP: 122/80  ?Pulse: 100  ?Resp: 16  ?Temp: 98 ?F (36.7 ?C)  ?SpO2: 96%  ? ? ?CONSTITUTIONAL: Alert and oriented and responds appropriately to questions.  Elderly, in no distress ?HEAD: Normocephalic, atraumatic ?EYES: Conjunctivae clear, pupils appear equal, sclera nonicteric ?ENT: normal nose; moist mucous membranes ?NECK: Supple, normal ROM ?CARD: RRR; S1 and S2 appreciated; no murmurs, no clicks, no rubs, no gallops ?RESP: Normal chest excursion without splinting or tachypnea; breath sounds clear and equal bilaterally; no wheezes, no rhonchi, no rales, no hypoxia or respiratory distress, speaking full sentences ?ABD/GI: Normal bowel sounds; non-distended; soft, non-tender, no rebound, no guarding, no peritoneal signs ?BACK: The back appears normal no midline spinal tenderness or step-off or deformity ?EXT: She is tender to palpation over the left anterior hip, anterior and lateral left thigh and left knee without bony deformity, joint effusion, soft  tissue swelling, ecchymosis, redness or warmth.  2+ DP pulse in the left leg.  Compartments soft.  No calf tenderness or calf swelling.  She is able to stand and has a shuffling gait to the bed but requires 2 person assist. ?SKIN: Normal color for age and race; warm; no rash on exposed skin ?NEURO: Moves all extremities equally, normal speech ?PSYCH: The patient's mood and manner are appropriate. ? ? ?ED Results / Procedures /  Treatments  ? ?LABS: ?(all labs ordered are listed, but only abnormal results are displayed) ?Labs Reviewed - No data to display ? ? ?EKG: ? ? ?RADIOLOGY: ?My personal review and interpretation of imaging: X-rays show no fracture or dislocation. ? ?I have personally reviewed all radiology reports.   ?DG Knee 2 Views Left ? ?Result Date: 01/09/2022 ?CLINICAL DATA:  Left knee pain after fall. EXAM: LEFT KNEE - 1-2 VIEW COMPARISON:  None. FINDINGS: No evidence of fracture, dislocation, or joint effusion. No evidence of arthropathy or other focal bone abnormality. Soft tissues are unremarkable. IMPRESSION: Negative. Electronically Signed   By: Marijo Conception M.D.   On: 01/09/2022 18:40  ? ?DG Hip Unilat W or Wo Pelvis 2-3 Views Left ? ?Result Date: 01/09/2022 ?CLINICAL DATA:  Left leg pain after fall. EXAM: DG HIP (WITH OR WITHOUT PELVIS) 2-3V LEFT COMPARISON:  None. FINDINGS: There is no evidence of hip fracture or dislocation. There is no evidence of arthropathy or other focal bone abnormality. IMPRESSION: Negative. Electronically Signed   By: Marijo Conception M.D.   On: 01/09/2022 18:38   ? ? ?PROCEDURES: ? ?Critical Care performed: No ? ? ?CRITICAL CARE ?Performed by: Cyril Mourning Damari Suastegui ? ? ?Total critical care time: 0 minutes ? ?Critical care time was exclusive of separately billable procedures and treating other patients. ? ?Critical care was necessary to treat or prevent imminent or life-threatening deterioration. ? ?Critical care was time spent personally by me on the following activities:  development of treatment plan with patient and/or surrogate as well as nursing, discussions with consultants, evaluation of patient's response to treatment, examination of patient, obtaining history from patient

## 2022-01-09 NOTE — Discharge Instructions (Addendum)
You are being provided a prescription for opiates (also known as narcotics) for pain control.  Opiates can be addictive and should only be used when absolutely necessary for pain control when other alternatives do not work.  We recommend you only use them for the recommended amount of time and only as prescribed.  Please do not take with other sedative medications or alcohol.  Please do not drive, operate machinery, make important decisions while taking opiates.  Please note that these medications can be addictive and have high abuse potential.  Patients can become addicted to narcotics after only taking them for a few days.  Please keep these medications locked away from children, teenagers or any family members with history of substance abuse.  Narcotic pain medicine may also make you constipated.  You may use over-the-counter medications such as MiraLAX, Colace to prevent constipation.  If you become constipated, you may use over-the-counter enemas as needed.  Itching and nausea are also common side effects of narcotic pain medication.  If you develop uncontrolled vomiting or a rash, please stop these medications and seek medical care. ? ? ?Please stop your tramadol while taking Percocet. ?

## 2022-01-10 ENCOUNTER — Telehealth: Payer: Self-pay

## 2022-01-10 NOTE — Telephone Encounter (Signed)
Tonya with [facility], [phone], occupational therapy, Kenney Houseman has not been able to schedule a visit due to pt being in the ED for muscle spasms in legs, pt's daughter stated she will call Tonya to schedule when pt is feeling up to it.  ?

## 2022-01-12 ENCOUNTER — Encounter: Payer: Self-pay | Admitting: Physician Assistant

## 2022-01-12 ENCOUNTER — Other Ambulatory Visit: Payer: Self-pay

## 2022-01-12 ENCOUNTER — Ambulatory Visit (INDEPENDENT_AMBULATORY_CARE_PROVIDER_SITE_OTHER): Payer: Medicare HMO | Admitting: Nurse Practitioner

## 2022-01-12 VITALS — BP 134/68 | HR 120 | Temp 97.0°F | Resp 16 | Ht <= 58 in | Wt 143.0 lb

## 2022-01-12 DIAGNOSIS — M62838 Other muscle spasm: Secondary | ICD-10-CM | POA: Diagnosis not present

## 2022-01-12 DIAGNOSIS — S76912S Strain of unspecified muscles, fascia and tendons at thigh level, left thigh, sequela: Secondary | ICD-10-CM

## 2022-01-12 DIAGNOSIS — Z7901 Long term (current) use of anticoagulants: Secondary | ICD-10-CM

## 2022-01-12 DIAGNOSIS — I4891 Unspecified atrial fibrillation: Secondary | ICD-10-CM | POA: Diagnosis not present

## 2022-01-12 MED ORDER — METHOCARBAMOL 500 MG PO TABS
500.0000 mg | ORAL_TABLET | Freq: Three times a day (TID) | ORAL | 1 refills | Status: DC | PRN
Start: 2022-01-12 — End: 2022-03-14

## 2022-01-12 MED ORDER — OXYCODONE-ACETAMINOPHEN 5-325 MG PO TABS: 1.0000 | ORAL_TABLET | Freq: Four times a day (QID) | ORAL | 0 refills | Status: DC | PRN

## 2022-01-12 MED ORDER — WARFARIN SODIUM 2 MG PO TABS: 2.0000 mg | ORAL_TABLET | Freq: Every day | ORAL | 3 refills | Status: DC

## 2022-01-12 MED ORDER — WARFARIN SODIUM 4 MG PO TABS: 4.0000 mg | ORAL_TABLET | ORAL | 0 refills | Status: DC

## 2022-01-12 NOTE — Progress Notes (Signed)
[facility] ?[address] ?[place], [place] [address] ? ?Internal MEDICINE  ?Office Visit Note ? ?Patient Name: [name] ? [id number]  ?[id number] ? ?Date of Service: 01/12/2022 ? ?Chief Complaint  ?Patient presents with  ? Follow-up  ?  ED   ? Pain  ?  Left thigh  ? ? ?HPI ?[name] presents for a follow up visit for left thigh pain and muscle spasms s/p physical therapy and patient had an ED visit due to the pain not improving with cyclobenzaprine that was ordered at her previous office visit.  ?Her daughter accompanied her to the office visit today and the patient needs her warfarin refilled. She would also like to try something different to help with the left leg pain. ER gave the patient oxycodone but she states that the cyclobenzaprine does not help.  ? ? ? ?Current Medication: ?Outpatient Encounter Medications as of 01/12/2022  ?Medication Sig  ? acetaminophen (TYLENOL) 325 MG tablet Take 650 mg by mouth every 6 (six) hours as needed.  ? amLODipine (NORVASC) 5 MG tablet Take 1 tablet (5 mg total) by mouth daily.  ? atorvastatin (LIPITOR) 10 MG tablet Take 1 tablet (10 mg total) by mouth every evening.  ? citalopram (CELEXA) 20 MG tablet Take 1 tablet (20 mg total) by mouth daily.  ? donepezil (ARICEPT) 5 MG tablet Take 1 tablet (5 mg total) by mouth at bedtime. (Patient taking differently: Take 5 mg by mouth in the morning.)  ? ferrous sulfate 325 (65 FE) MG tablet Take 1 tablet (325 mg total) by mouth daily with breakfast.  ? insulin detemir (LEVEMIR FLEXTOUCH) 100 UNIT/ML FlexPen Inject 10 Units into the skin daily.  ? Insulin Pen Needle (PEN NEEDLES 3/16") 31G X 5 MM MISC 1 applicator by Does not apply route 4 (four) times daily.  ? linaclotide (LINZESS) 145 MCG CAPS capsule Take 1 capsule (145 mcg total) by mouth daily before breakfast. (Patient taking differently: Take 145 mcg by mouth at bedtime.)  ? linagliptin (TRADJENTA) 5 MG TABS tablet Take 1 tablet (5 mg total) by mouth daily.  ?  melatonin 3 MG TABS tablet Take 3 mg by mouth at bedtime as needed (sleep).  ? methocarbamol (ROBAXIN) 500 MG tablet Take 1 tablet (500 mg total) by mouth every 8 (eight) hours as needed for muscle spasms.  ? neomycin-polymyxin-hydrocortisone (CORTISPORIN) 3.5-10000-1 ophthalmic suspension Place 2 drops into both eyes in the morning and at bedtime.  ? ondansetron (ZOFRAN-ODT) 4 MG disintegrating tablet Take 1 tablet (4 mg total) by mouth every 6 (six) hours as needed for nausea or vomiting.  ? pantoprazole (PROTONIX) 40 MG tablet Take 1 tablet by mouth twice daily  ? rOPINIRole (REQUIP) 0.5 MG tablet Take 1 tablet (0.5 mg total) by mouth every evening.  ? torsemide (DEMADEX) 20 MG tablet Take 2 tablets (40 mg total) by mouth daily.  ? traMADol (ULTRAM) 50 MG tablet Take 1 tablet (50 mg total) by mouth every 8 (eight) hours as needed for moderate pain or severe pain.  ? vitamin B-12 1000 MCG tablet Take 1 tablet (1,000 mcg total) by mouth daily.  ? [DISCONTINUED] cyclobenzaprine (FLEXERIL) 5 MG tablet Take 1-2 tablets (5-10 mg total) by mouth at bedtime as needed for muscle spasms.  ? [DISCONTINUED] oxyCODONE-acetaminophen (PERCOCET) 5-325 MG tablet Take 1 tablet by mouth every 6 (six) hours as needed for severe pain.  ? [DISCONTINUED] warfarin (COUMADIN) 2 MG tablet Take 1 tablet (2 mg total) by mouth daily. Except  on wed and sat take 4 mg  ? [DISCONTINUED] warfarin (COUMADIN) 4 MG tablet Take 1 tablet (4 mg total) by mouth 2 (two) times a week. On Wednesday and saturday  ? oxyCODONE-acetaminophen (PERCOCET) 5-325 MG tablet Take 1 tablet by mouth every 6 (six) hours as needed for severe pain.  ? warfarin (COUMADIN) 2 MG tablet Take 1 tablet (2 mg total) by mouth daily. Except on wed and sat take 4 mg  ? warfarin (COUMADIN) 4 MG tablet Take 1 tablet (4 mg total) by mouth 2 (two) times a week. On Wednesday and saturday  ? ?No facility-administered encounter medications on file as of 01/12/2022.  ? ? ?Surgical  History: ?Past Surgical History:  ?Procedure Laterality Date  ? ABDOMINAL HYSTERECTOMY    ? APPENDECTOMY    ? CATARACT EXTRACTION    ? CHOLECYSTECTOMY    ? HERNIA REPAIR    ? right knee replacement    ? right nephroectomy    ? ? ?Medical History: ?Past Medical History:  ?Diagnosis Date  ? Anemia   ? Atrial fibrillation ([place])   ? Chronic kidney disease   ? Congestive heart failure (CHF) ([place])   ? Diabetes mellitus without complication ([place])   ? GERD (gastroesophageal reflux disease)   ? Hyperlipidemia   ? Hypertension   ? Pneumonia   ? Pulmonary fibrosis ([place])   ? ? ?Family History: ?Family History  ?Problem Relation Age of Onset  ? Diabetes Mother   ? Breast cancer Mother   ? Heart disease Father   ? Diabetes Father   ? Diabetes Sister   ? Diabetes Brother   ? Cancer Brother   ? ? ?Social History  ? ?Socioeconomic History  ? Marital status: Widowed  ?  Spouse name: Not on file  ? Number of children: Not on file  ? Years of education: Not on file  ? Highest education level: Not on file  ?Occupational History  ? Occupation: retired  ?Tobacco Use  ? Smoking status: Every Day  ?  Packs/day: 1.00  ?  Types: Cigarettes  ?  Last attempt to quit: 02/10/2021  ?  Years since quitting: 0.9  ? Smokeless tobacco: Never  ? Tobacco comments:  ?  1 pack weekly  ?Vaping Use  ? Vaping Use: Never used  ?Substance and Sexual Activity  ? Alcohol use: No  ? Drug use: No  ? Sexual activity: Not Currently  ?Other Topics Concern  ? Not on file  ?Social History Narrative  ? Lives with daughter  ? ?Social Determinants of Health  ? ?Financial Resource Strain: Not on file  ?Food Insecurity: Not on file  ?Transportation Needs: Not on file  ?Physical Activity: Not on file  ?Stress: Not on file  ?Social Connections: Not on file  ?Intimate Partner Violence: Not on file  ? ? ? ? ?Review of Systems  ?Constitutional:  Negative for chills, fatigue and unexpected weight change.  ?HENT: Negative.  Negative for congestion, rhinorrhea, sneezing and sore  throat.   ?Eyes:  Negative for redness.  ?Respiratory: Negative.  Negative for cough, chest tightness, shortness of breath and wheezing.   ?Cardiovascular: Negative.  Negative for chest pain and palpitations.  ?Gastrointestinal: Negative.  Negative for abdominal pain, constipation, diarrhea, nausea and vomiting.  ?Genitourinary:  Negative for dysuria and frequency.  ?Musculoskeletal: Negative.  Negative for arthralgias, back pain, joint swelling and neck pain.  ?Skin:  Negative for rash.  ?Neurological: Negative.  Negative for tremors and numbness.  ?Hematological:  Negative for adenopathy. Does not bruise/bleed easily.  ?Psychiatric/Behavioral:  Negative for behavioral problems (Depression), sleep disturbance and suicidal ideas. The patient is not nervous/anxious.   ? ?Vital Signs: ?BP 134/68   Pulse (!) 120   Temp (!) 97 ?F (36.1 ?C)   Resp 16   Ht '4\' 10"'$  (1.473 m)   Wt 143 lb (64.9 kg)   SpO2 99%   BMI 29.89 kg/m?  ? ? ?Physical Exam ?Vitals reviewed.  ?Constitutional:   ?   General: She is not in acute distress. ?   Appearance: Normal appearance. She is normal weight. She is not ill-appearing.  ?HENT:  ?   Head: Normocephalic and atraumatic.  ?Eyes:  ?   Pupils: Pupils are equal, round, and reactive to light.  ?Cardiovascular:  ?   Rate and Rhythm: Normal rate and regular rhythm.  ?Pulmonary:  ?   Effort: Pulmonary effort is normal. No respiratory distress.  ?Neurological:  ?   Mental Status: She is alert and oriented to person, place, and time.  ?   Cranial Nerves: No cranial nerve deficit.  ?   Coordination: Coordination normal.  ?   Gait: Gait (uses cane) normal.  ?Psychiatric:     ?   Mood and Affect: Mood normal.     ?   Behavior: Behavior normal.  ? ? ? ? ? ?Assessment/Plan: ?1. Muscle strain of left thigh, sequela ?Cyclobenzaprine discontinued. Methocarbamol prescribed and a small 5 day prescription of oxycodone.  ?- methocarbamol (ROBAXIN) 500 MG tablet; Take 1 tablet (500 mg total) by mouth every  8 (eight) hours as needed for muscle spasms.  Dispense: 90 tablet; Refill: 1 ? ?2. Muscle spasm of left lower extremity ?Methocarbamol prescribed, cyclobenzaprine discontinued.  ?- methocarbamol (ROBAXIN) 500 MG tablet; Take

## 2022-01-16 DIAGNOSIS — Z7901 Long term (current) use of anticoagulants: Secondary | ICD-10-CM | POA: Diagnosis not present

## 2022-01-17 ENCOUNTER — Ambulatory Visit (INDEPENDENT_AMBULATORY_CARE_PROVIDER_SITE_OTHER): Payer: Medicare HMO

## 2022-01-17 ENCOUNTER — Encounter: Payer: Self-pay | Admitting: Nurse Practitioner

## 2022-01-17 ENCOUNTER — Encounter: Payer: Self-pay | Admitting: Internal Medicine

## 2022-01-17 ENCOUNTER — Other Ambulatory Visit: Payer: Self-pay | Admitting: Internal Medicine

## 2022-01-17 ENCOUNTER — Telehealth: Payer: Self-pay

## 2022-01-17 DIAGNOSIS — I4891 Unspecified atrial fibrillation: Secondary | ICD-10-CM

## 2022-01-17 LAB — PROTIME-INR

## 2022-01-17 MED ORDER — OXYCODONE-ACETAMINOPHEN 5-325 MG PO TABS: ORAL_TABLET | ORAL | 0 refills | Status: DC

## 2022-01-17 NOTE — Progress Notes (Signed)
Pt INR 2.1 as per dr Humphrey Rolls no change continue same med  as prescribed  ?

## 2022-01-17 NOTE — Telephone Encounter (Signed)
error 

## 2022-01-18 ENCOUNTER — Encounter: Payer: Self-pay | Admitting: Nurse Practitioner

## 2022-01-19 ENCOUNTER — Telehealth: Payer: Self-pay

## 2022-01-19 NOTE — Telephone Encounter (Signed)
212 pm.  Phone call made to patient to complete a telephonic visit.  No answer.  Message left requesting a call back.  ?

## 2022-01-22 ENCOUNTER — Encounter: Payer: Self-pay | Admitting: Nurse Practitioner

## 2022-01-26 ENCOUNTER — Telehealth: Payer: Self-pay

## 2022-01-26 ENCOUNTER — Other Ambulatory Visit: Payer: Self-pay | Admitting: Nurse Practitioner

## 2022-01-26 DIAGNOSIS — E1165 Type 2 diabetes mellitus with hyperglycemia: Secondary | ICD-10-CM

## 2022-01-26 MED ORDER — TRAMADOL HCL 50 MG PO TABS: ORAL_TABLET | ORAL | 0 refills | Status: DC

## 2022-01-26 NOTE — Telephone Encounter (Signed)
I sent it, Can u check which pharmacy?

## 2022-01-26 NOTE — Telephone Encounter (Signed)
Spoke to June, let her know med was sent to the [facility]  ?

## 2022-01-27 DIAGNOSIS — S42202A Unspecified fracture of upper end of left humerus, initial encounter for closed fracture: Secondary | ICD-10-CM | POA: Diagnosis not present

## 2022-01-30 ENCOUNTER — Encounter: Payer: Self-pay | Admitting: Nurse Practitioner

## 2022-01-30 ENCOUNTER — Ambulatory Visit (INDEPENDENT_AMBULATORY_CARE_PROVIDER_SITE_OTHER): Payer: Medicare HMO

## 2022-01-30 DIAGNOSIS — I4891 Unspecified atrial fibrillation: Secondary | ICD-10-CM

## 2022-01-30 LAB — PROTIME-INR

## 2022-01-30 NOTE — Progress Notes (Signed)
Pt INR 2.4 as per Alyssa no change continue same med  as prescribed  ?

## 2022-02-15 ENCOUNTER — Ambulatory Visit (INDEPENDENT_AMBULATORY_CARE_PROVIDER_SITE_OTHER): Payer: Medicare HMO

## 2022-02-15 DIAGNOSIS — I4891 Unspecified atrial fibrillation: Secondary | ICD-10-CM | POA: Diagnosis not present

## 2022-02-15 NOTE — Progress Notes (Signed)
Pt INR 2.1 as per alyssa continue same med  no change for med  ?

## 2022-02-16 ENCOUNTER — Encounter: Payer: Self-pay | Admitting: Nurse Practitioner

## 2022-02-16 LAB — PROTIME-INR

## 2022-02-20 ENCOUNTER — Telehealth: Payer: Self-pay

## 2022-02-20 NOTE — Telephone Encounter (Signed)
1150 am.  Phone call made to patient to follow up on overall status and offer a home visit.  No answer.  Message left on VM requesting a call back.  ?

## 2022-02-22 ENCOUNTER — Telehealth: Payer: Self-pay

## 2022-03-02 ENCOUNTER — Other Ambulatory Visit: Payer: Medicare HMO

## 2022-03-02 DIAGNOSIS — Z515 Encounter for palliative care: Secondary | ICD-10-CM

## 2022-03-02 IMAGING — CT CT ABD-PELV W/O CM
2 of 4 series · 16 of 46 positions shown, 18 images · non-contrast
Comparison: CT abdomen and pelvis 10/27/2019.

CLINICAL DATA: Acute onset abdominal pain.

EXAM:
CT ABDOMEN AND PELVIS WITHOUT CONTRAST
TECHNIQUE: Multidetector CT imaging of the abdomen and pelvis was performed
following the standard protocol without IV contrast.

[Series 2: routine abd/pel wo · axial · 0.89mm/px · z∈[-997,-607]mm · 13 of 86 slices shown, 15 images]
[im 4/86  soft-tissue]
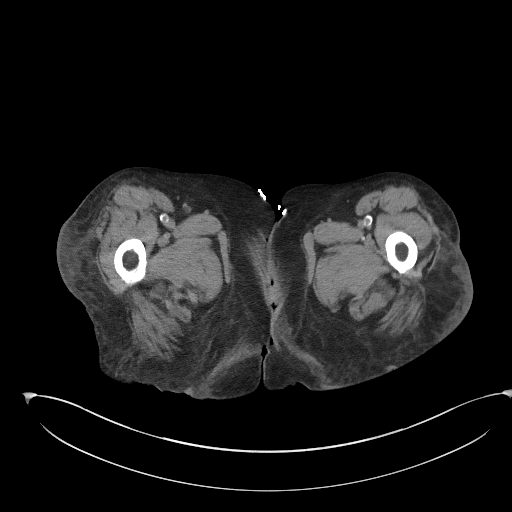
[im 4/86  bone]
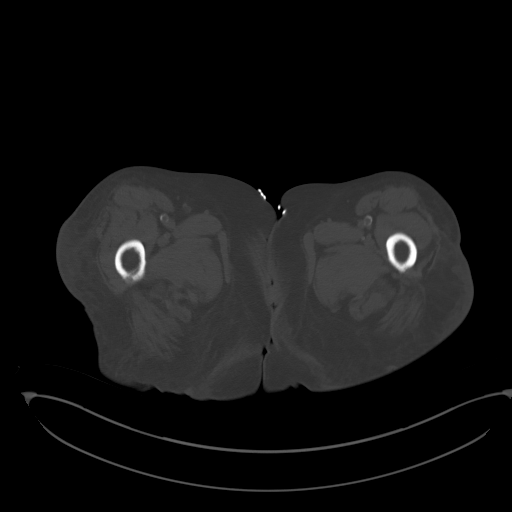
[im 12/86  soft-tissue]
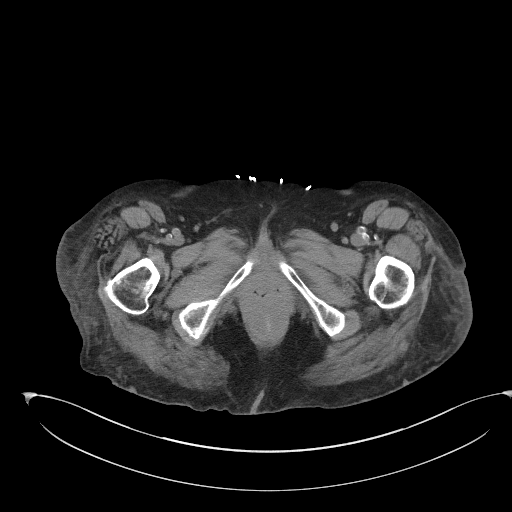
[im 19/86  soft-tissue]
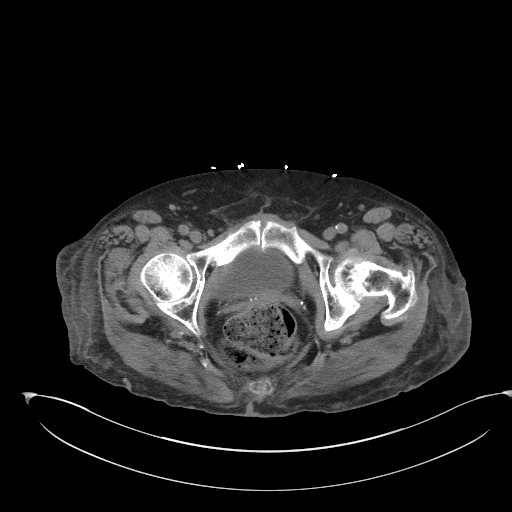
[im 23/86  soft-tissue]
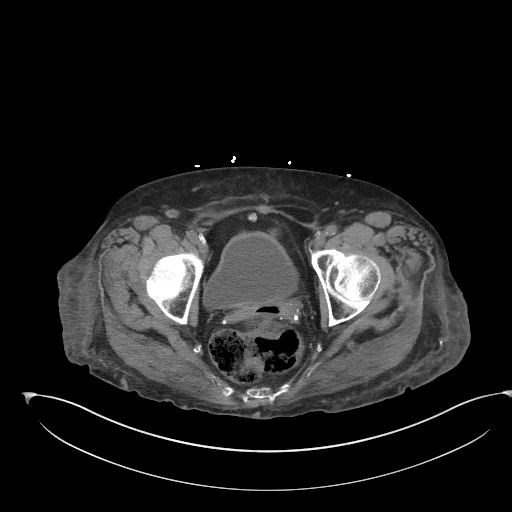
[im 30/86  soft-tissue]
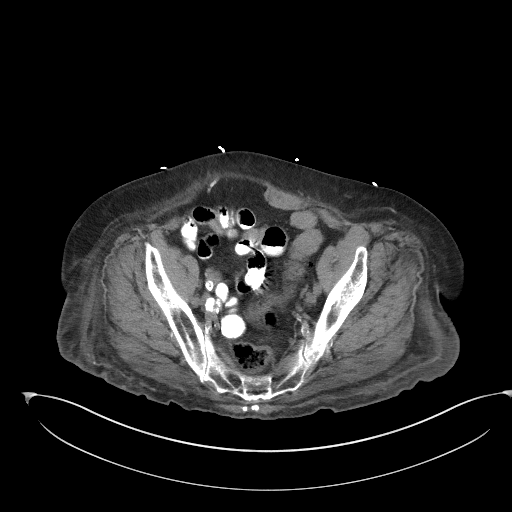
[im 37/86  soft-tissue]
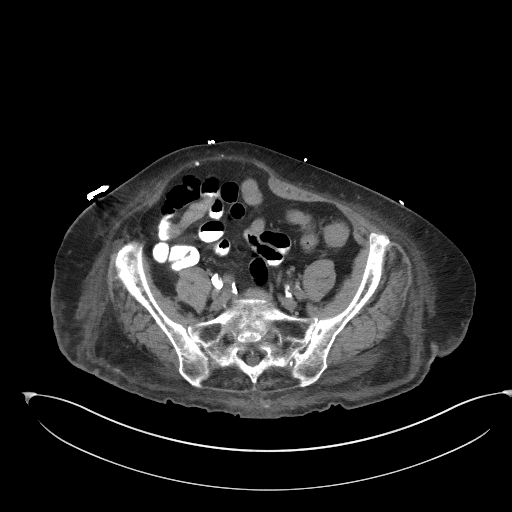
[im 45/86  soft-tissue]
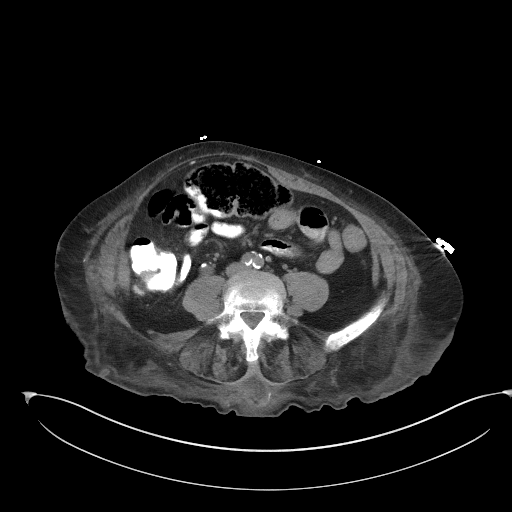
[im 49/86  soft-tissue]
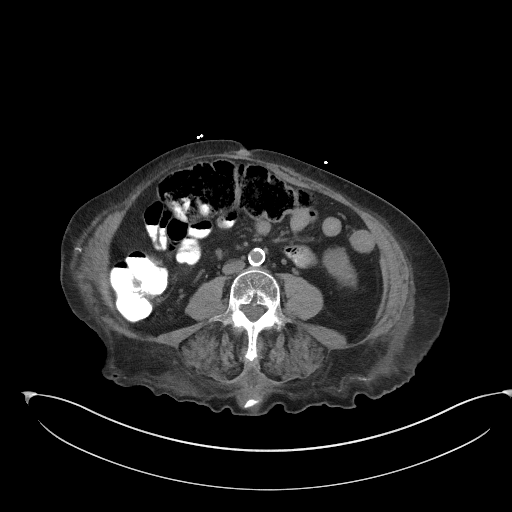
[im 56/86  soft-tissue]
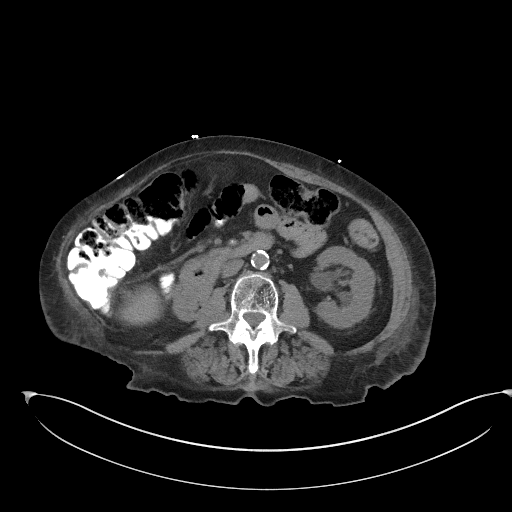
[im 56/86  bone]
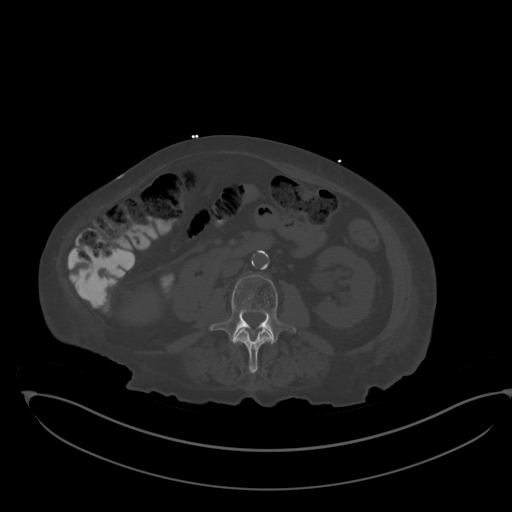
[im 63/86  soft-tissue]
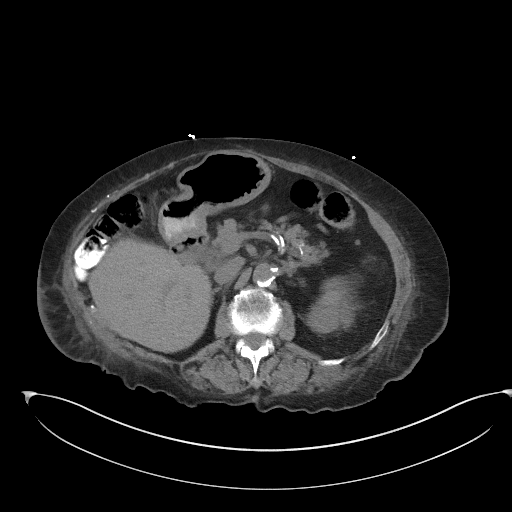
[im 67/86  soft-tissue]
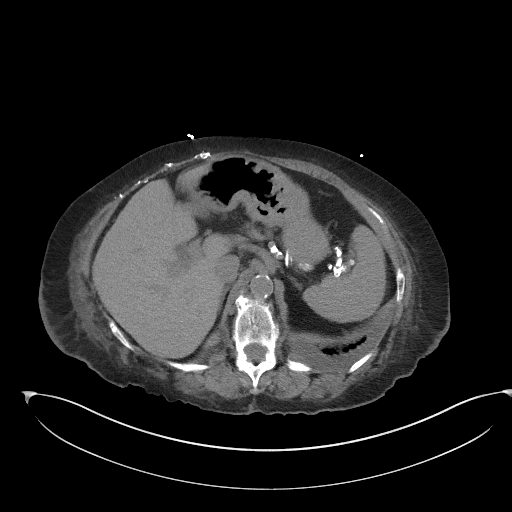
[im 74/86  soft-tissue]
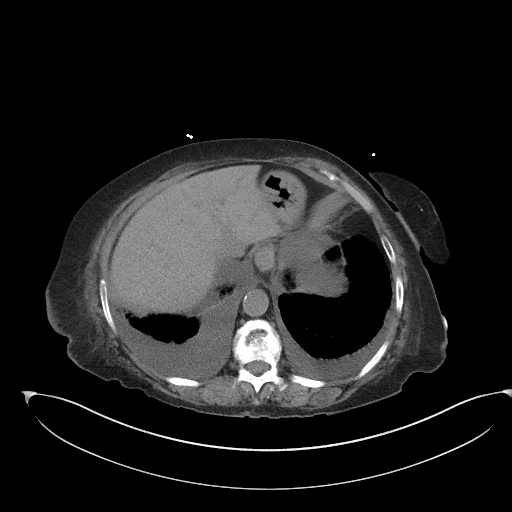
[im 82/86  soft-tissue]
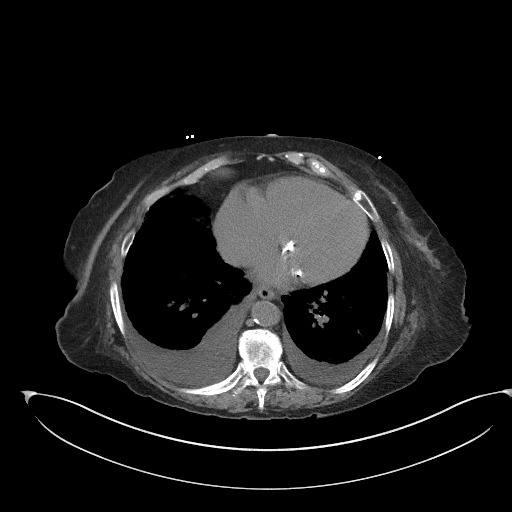

[Series 5: coronal st · coronal · 0.78mm/px · 3 of 86 slices shown]
[im 29/86  soft-tissue]
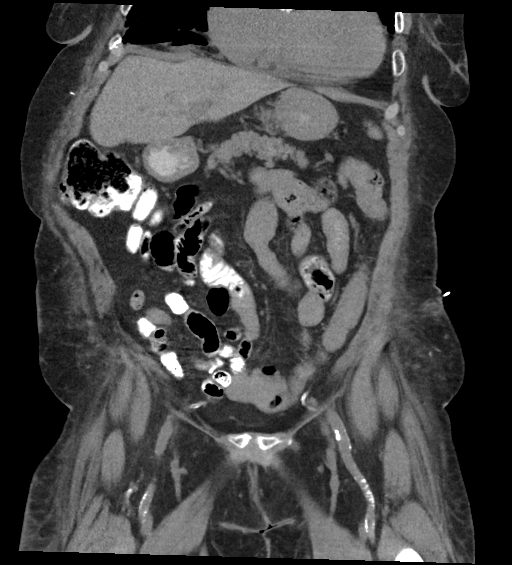
[im 38/86  soft-tissue]
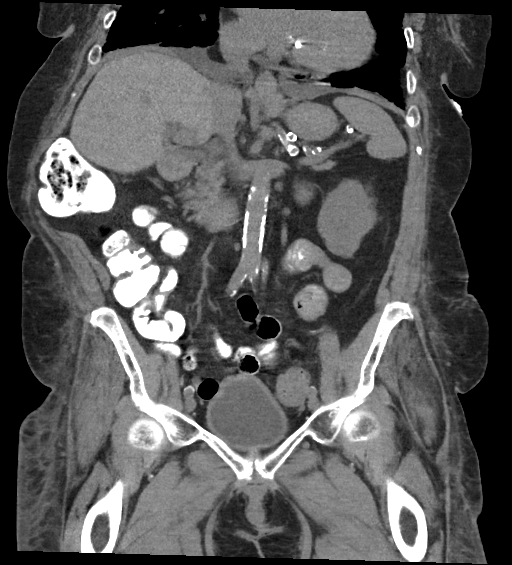
[im 48/86  soft-tissue]
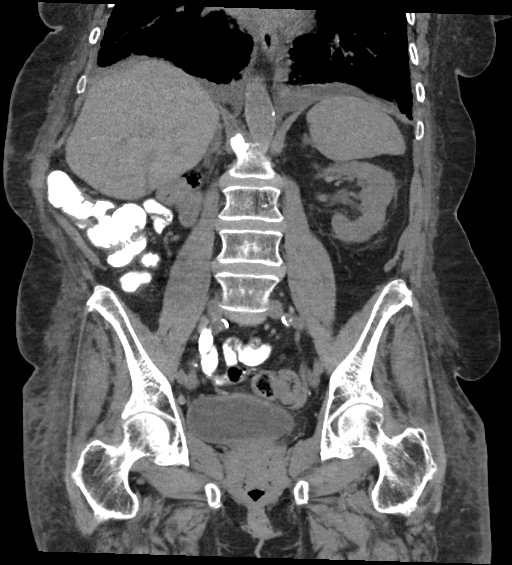

[16 of 46 positions shown; findings below may reference images not displayed]

FINDINGS: Lower chest: Mild cardiomegaly. No pericardial effusion. Small
bilateral pleural effusions are present, larger on the right.
Interstitial coarsening and mild dependent atelectasis noted.

Hepatobiliary: No focal liver abnormality is seen. Status post
cholecystectomy. No biliary dilatation.

Pancreas: Unremarkable. No pancreatic ductal dilatation or
surrounding inflammatory changes.

Spleen: Normal in size without focal abnormality.

Adrenals/Urinary Tract: The adrenal glands appear normal. The
patient is status post right nephrectomy. Left kidney and ureter are
negative. Urinary bladder is unremarkable.

Stomach/Bowel: Stomach is within normal limits. Status post
appendectomy. No evidence of bowel wall thickening, distention, or
inflammatory changes.

Vascular/Lymphatic: Aortic atherosclerosis. No enlarged abdominal or
pelvic lymph nodes.

Reproductive: Uterus and bilateral adnexa are unremarkable.

Other: Dependent at edema is seen in subcutaneous fatty tissues. No
ascites. No hernia.

Musculoskeletal: No acute or focal abnormality.
IMPRESSION: No acute abnormality within the abdomen or pelvis.

Small bilateral pleural effusions and dependent subcutaneous edema
suggestive of volume overload.

Mild cardiomegaly.

Aortic Atherosclerosis ([id number]-[id number].S).

## 2022-03-02 IMAGING — CR DG CHEST 2V
1 series · 2 of 2 positions shown · non-contrast
Comparison: 03/15/2021

CLINICAL DATA: Cough.  Weight gain

EXAM:
CHEST - 2 VIEW

[Series 1: dg chest 2 view · 0.14mm/px · 2 of 2 slices shown]
[im 1/2]
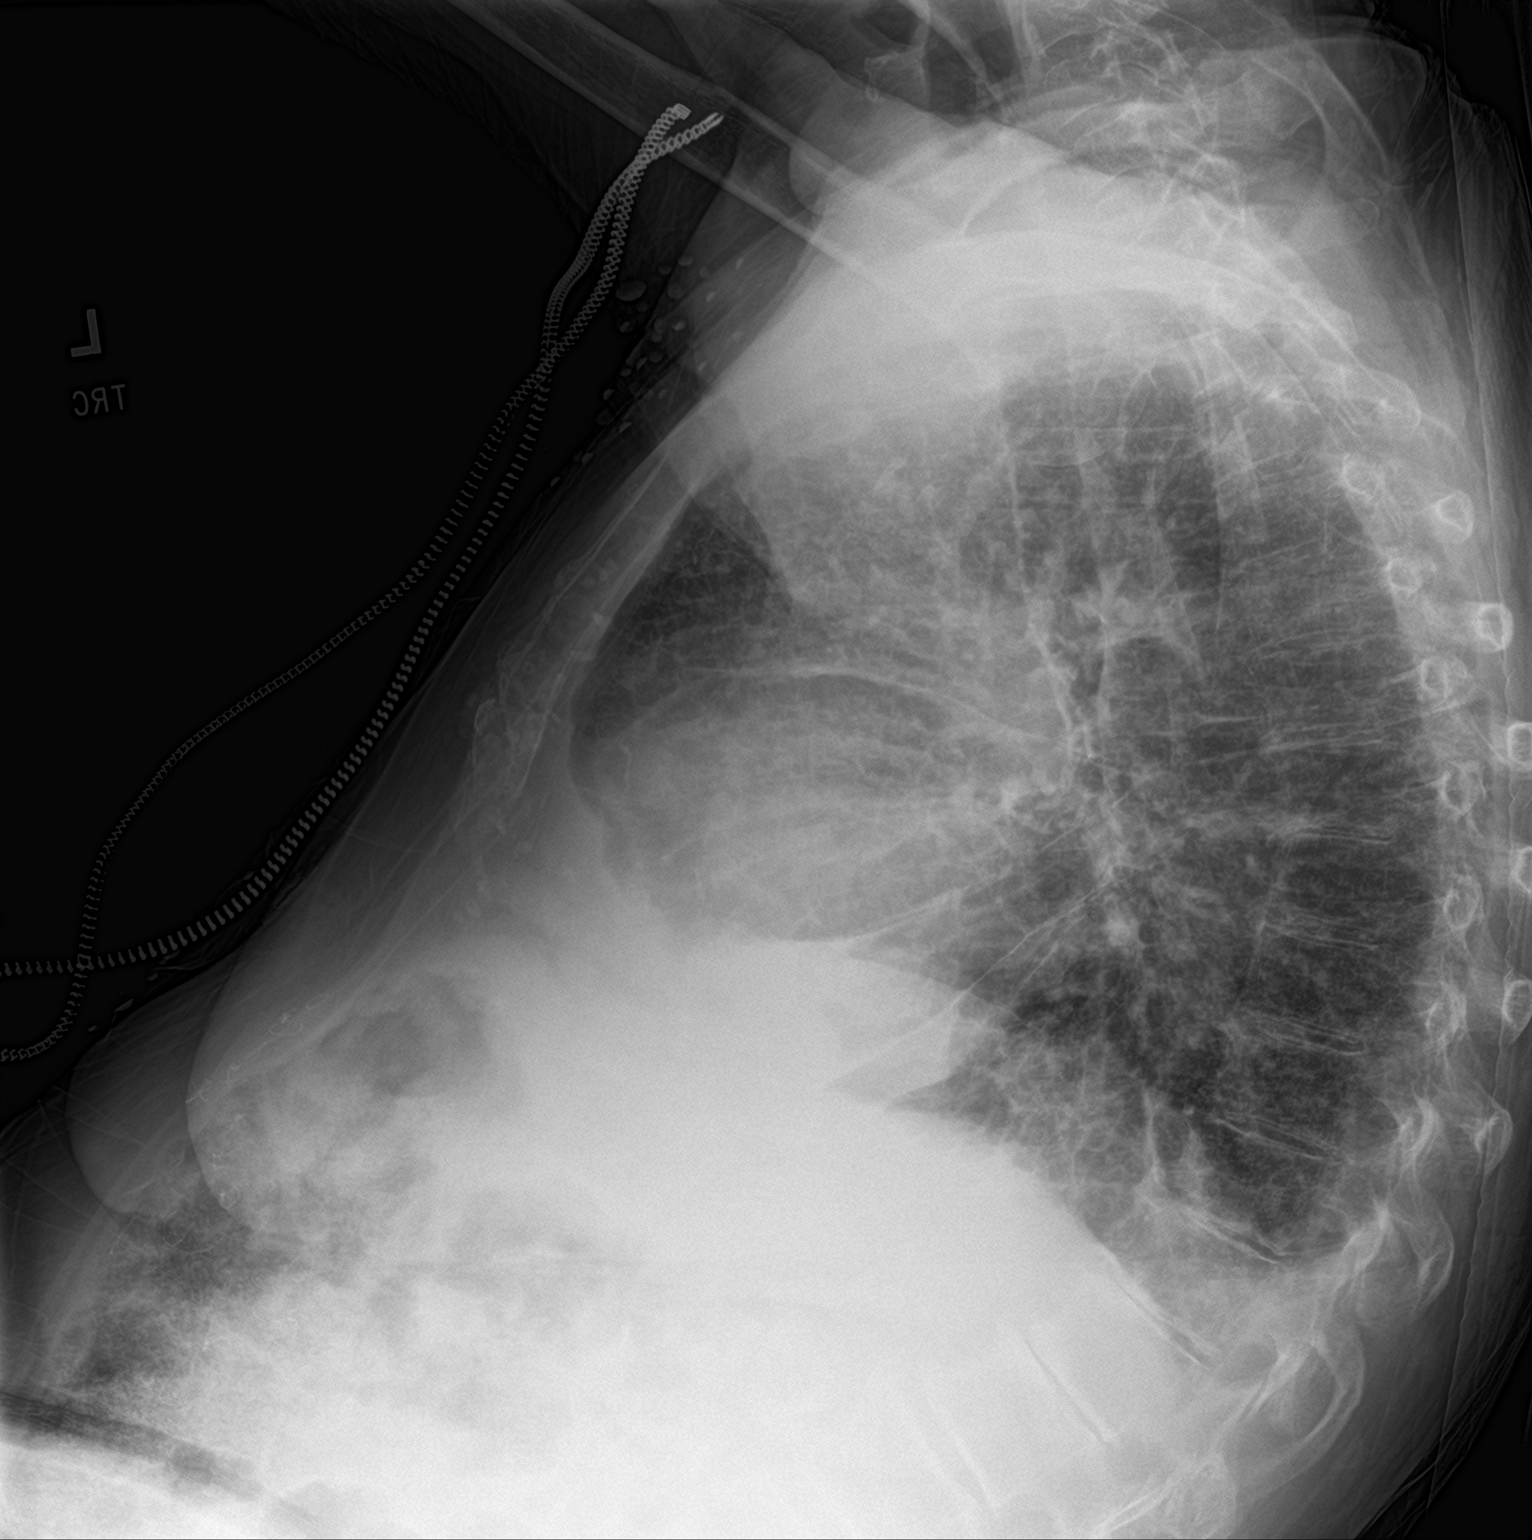
[im 2/2]
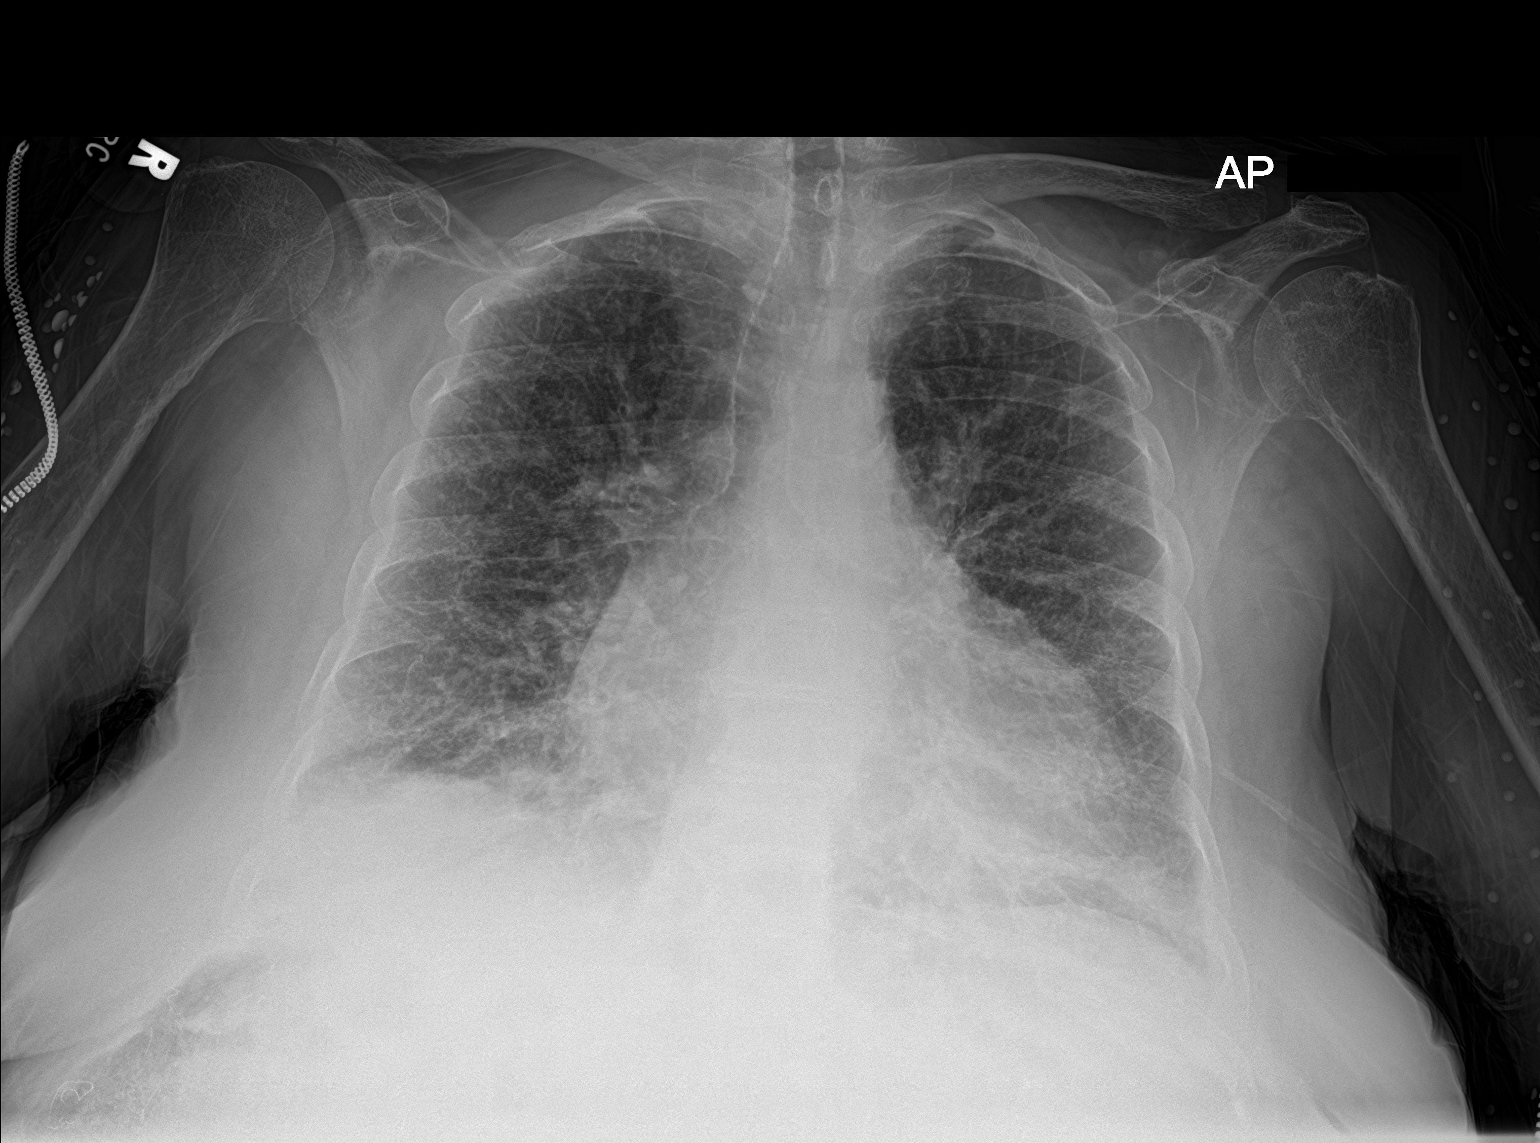

[2 of 2 positions shown; findings below may reference images not displayed]

FINDINGS: Diffuse interstitial coarsening above prior baseline. Mild
cardiomegaly. Stable upper mediastinal contours. No significant
effusion or focal consolidation.
IMPRESSION: Increased interstitial markings compared to prior and compatible
with interstitial edema. There is a background of chronic lung
disease/fibrosis by prior CTs.

## 2022-03-02 NOTE — Progress Notes (Signed)
PATIENT NAME: [name] ?DOB: 1941-09-23 ?MRN: [id number] ? ?PRIMARY CARE PROVIDER: Jonetta Osgood, NP ? ?RESPONSIBLE PARTY:  ?Acct ID - Guarantor Home Phone Work Phone Relationship Acct Type  ?[id number] - Switalski,MAR* [phone]  Self P/F  ?   [address], [place], [place] [address]  ? ?Phone call made to patient.  No answer/message left on VM requesting call back.  Phone call made to daughter June who is able to provide an update on patient status.   Patient is now going to [facility] Monday-Friday.  Transportation is picking her up between 8-9 and she stays until 3-4 pm.  Daughter would like PC NP to see patient on 03/14/22 as patient will be home and already has follow up with PCP that afternoon.  Appointment scheduled with Charlann Boxer, NP for 03/14/22 @ 1130 am.  ? ? ?Lorenza Burton, RN ?

## 2022-03-03 ENCOUNTER — Telehealth: Payer: Self-pay | Admitting: Student

## 2022-03-03 NOTE — Telephone Encounter (Signed)
Left VM with patient's daughter June, asking her if we could change the time of the 03/14/22 Palliative f/u visit from 11:30 AM to 1 PM, requested a return call to confirm this. ?

## 2022-03-07 ENCOUNTER — Telehealth: Payer: Self-pay

## 2022-03-07 ENCOUNTER — Other Ambulatory Visit: Payer: Self-pay | Admitting: Nurse Practitioner

## 2022-03-07 MED ORDER — TRAMADOL HCL 50 MG PO TABS: ORAL_TABLET | ORAL | 0 refills | Status: DC

## 2022-03-08 NOTE — Telephone Encounter (Signed)
[name] that we send med °

## 2022-03-09 ENCOUNTER — Ambulatory Visit (INDEPENDENT_AMBULATORY_CARE_PROVIDER_SITE_OTHER): Payer: Medicare HMO

## 2022-03-09 ENCOUNTER — Encounter: Payer: Self-pay | Admitting: Nurse Practitioner

## 2022-03-09 DIAGNOSIS — I4891 Unspecified atrial fibrillation: Secondary | ICD-10-CM | POA: Diagnosis not present

## 2022-03-09 DIAGNOSIS — Z7901 Long term (current) use of anticoagulants: Secondary | ICD-10-CM | POA: Diagnosis not present

## 2022-03-09 LAB — PROTIME-INR

## 2022-03-09 NOTE — Progress Notes (Signed)
Pt INR 1.4 as per alyssa no change for medication we can discuss with pt at next appt

## 2022-03-10 ENCOUNTER — Other Ambulatory Visit: Payer: Self-pay | Admitting: Internal Medicine

## 2022-03-10 DIAGNOSIS — K219 Gastro-esophageal reflux disease without esophagitis: Secondary | ICD-10-CM

## 2022-03-14 ENCOUNTER — Encounter: Payer: Self-pay | Admitting: Nurse Practitioner

## 2022-03-14 ENCOUNTER — Other Ambulatory Visit: Payer: Medicare HMO | Admitting: Student

## 2022-03-14 ENCOUNTER — Ambulatory Visit (INDEPENDENT_AMBULATORY_CARE_PROVIDER_SITE_OTHER): Payer: Medicare HMO | Admitting: Nurse Practitioner

## 2022-03-14 VITALS — BP 112/77 | HR 99 | Temp 98.3°F | Resp 16 | Ht <= 58 in | Wt 144.0 lb

## 2022-03-14 DIAGNOSIS — K5901 Slow transit constipation: Secondary | ICD-10-CM

## 2022-03-14 DIAGNOSIS — S76912S Strain of unspecified muscles, fascia and tendons at thigh level, left thigh, sequela: Secondary | ICD-10-CM

## 2022-03-14 DIAGNOSIS — I10 Essential (primary) hypertension: Secondary | ICD-10-CM | POA: Diagnosis not present

## 2022-03-14 DIAGNOSIS — G2581 Restless legs syndrome: Secondary | ICD-10-CM

## 2022-03-14 DIAGNOSIS — G8929 Other chronic pain: Secondary | ICD-10-CM

## 2022-03-14 DIAGNOSIS — E782 Mixed hyperlipidemia: Secondary | ICD-10-CM | POA: Diagnosis not present

## 2022-03-14 DIAGNOSIS — F331 Major depressive disorder, recurrent, moderate: Secondary | ICD-10-CM | POA: Diagnosis not present

## 2022-03-14 DIAGNOSIS — M62838 Other muscle spasm: Secondary | ICD-10-CM

## 2022-03-14 DIAGNOSIS — E1165 Type 2 diabetes mellitus with hyperglycemia: Secondary | ICD-10-CM

## 2022-03-14 DIAGNOSIS — I5042 Chronic combined systolic (congestive) and diastolic (congestive) heart failure: Secondary | ICD-10-CM

## 2022-03-14 DIAGNOSIS — G3184 Mild cognitive impairment, so stated: Secondary | ICD-10-CM | POA: Diagnosis not present

## 2022-03-14 DIAGNOSIS — Z515 Encounter for palliative care: Secondary | ICD-10-CM

## 2022-03-14 LAB — POCT GLYCOSYLATED HEMOGLOBIN (HGB A1C): Hemoglobin A1C: 7.9 % — AB (ref 4.0–5.6)

## 2022-03-14 MED ORDER — LINAGLIPTIN 5 MG PO TABS: 5.0000 mg | ORAL_TABLET | Freq: Every day | ORAL | 3 refills | Status: DC

## 2022-03-14 MED ORDER — DONEPEZIL HCL 5 MG PO TABS: 5.0000 mg | ORAL_TABLET | Freq: Every morning | ORAL | 1 refills | Status: DC

## 2022-03-14 MED ORDER — ROPINIROLE HCL 0.5 MG PO TABS: 0.5000 mg | ORAL_TABLET | Freq: Every evening | ORAL | 1 refills | Status: DC

## 2022-03-14 MED ORDER — CITALOPRAM HYDROBROMIDE 20 MG PO TABS: 20.0000 mg | ORAL_TABLET | Freq: Every day | ORAL | 3 refills | Status: DC

## 2022-03-14 MED ORDER — METHOCARBAMOL 500 MG PO TABS: 500.0000 mg | ORAL_TABLET | Freq: Three times a day (TID) | ORAL | 1 refills | Status: DC | PRN

## 2022-03-14 MED ORDER — LINACLOTIDE 145 MCG PO CAPS: 145.0000 ug | ORAL_CAPSULE | Freq: Every evening | ORAL | 1 refills | Status: DC

## 2022-03-14 MED ORDER — TRAMADOL HCL 50 MG PO TABS: ORAL_TABLET | ORAL | 0 refills | Status: DC

## 2022-03-14 MED ORDER — ATORVASTATIN CALCIUM 10 MG PO TABS: 10.0000 mg | ORAL_TABLET | Freq: Every evening | ORAL | 1 refills | Status: DC

## 2022-03-14 MED ORDER — AMLODIPINE BESYLATE 5 MG PO TABS: 5.0000 mg | ORAL_TABLET | Freq: Every day | ORAL | 3 refills | Status: DC

## 2022-03-14 NOTE — Progress Notes (Signed)
[facility] [place], [place] [address]  Internal MEDICINE  Office Visit Note  Patient Name: [name]  [id number]  [id number]  Date of Service: 03/14/2022  Chief Complaint  Patient presents with   Diabetes   Gastroesophageal Reflux   Hyperlipidemia   Hypertension   Anemia   Follow-up    HPI [name] presents for follow-up visit for hypertension, hyperlipidemia, GERD, diabetes and medication refills.  She is also still having pain in her left thigh and muscle spasms.  She went to the emergency room previously and was diagnosed with a muscle strain in the left lower extremity but currently the medication she was given is not helping with the pain. Her A1c had spiked in February to 13.5 when she was in the hospital and we rechecked her A1c today and it has significantly improved to 7.9.  Her blood pressure and other vital signs are stable and within normal limits.     Current Medication: Outpatient Encounter Medications as of 03/14/2022  Medication Sig   ferrous sulfate 325 (65 FE) MG tablet Take 1 tablet (325 mg total) by mouth daily with breakfast.   insulin detemir (LEVEMIR FLEXTOUCH) 100 UNIT/ML FlexPen Inject 10 Units into the skin daily.   Insulin Pen Needle (PEN NEEDLES 3/16") 31G X 5 MM MISC 1 applicator by Does not apply route 4 (four) times daily.   melatonin 3 MG TABS tablet Take 3 mg by mouth at bedtime as needed (sleep).   ondansetron (ZOFRAN-ODT) 4 MG disintegrating tablet Take 1 tablet (4 mg total) by mouth every 6 (six) hours as needed for nausea or vomiting.   pantoprazole (PROTONIX) 40 MG tablet Take 1 tablet by mouth twice daily   torsemide (DEMADEX) 20 MG tablet Take 2 tablets (40 mg total) by mouth daily.   vitamin B-12 1000 MCG tablet Take 1 tablet (1,000 mcg total) by mouth daily.   warfarin (COUMADIN) 2 MG tablet Take 1 tablet (2 mg total) by mouth daily. Except on wed and sat take 4 mg (Patient taking differently: Take 2 mg by mouth  daily. Except on MON,wed and sat take 4 mg)   warfarin (COUMADIN) 4 MG tablet Take 1 tablet (4 mg total) by mouth 2 (two) times a week. On Wednesday and saturday (Patient taking differently: Take 4 mg by mouth 3 (three) times a week. On Monday  Wednesday and saturday)   [DISCONTINUED] amLODipine (NORVASC) 5 MG tablet Take 1 tablet (5 mg total) by mouth daily.   [DISCONTINUED] atorvastatin (LIPITOR) 10 MG tablet Take 1 tablet (10 mg total) by mouth every evening.   [DISCONTINUED] citalopram (CELEXA) 20 MG tablet Take 1 tablet (20 mg total) by mouth daily.   [DISCONTINUED] donepezil (ARICEPT) 5 MG tablet Take 1 tablet (5 mg total) by mouth at bedtime. (Patient taking differently: Take 5 mg by mouth in the morning.)   [DISCONTINUED] linaclotide (LINZESS) 145 MCG CAPS capsule Take 1 capsule (145 mcg total) by mouth daily before breakfast. (Patient taking differently: Take 145 mcg by mouth at bedtime.)   [DISCONTINUED] methocarbamol (ROBAXIN) 500 MG tablet Take 1 tablet (500 mg total) by mouth every 8 (eight) hours as needed for muscle spasms.   [DISCONTINUED] neomycin-polymyxin-hydrocortisone (CORTISPORIN) 3.5-10000-1 ophthalmic suspension Place 2 drops into both eyes in the morning and at bedtime.   [DISCONTINUED] rOPINIRole (REQUIP) 0.5 MG tablet Take 1 tablet (0.5 mg total) by mouth every evening.   [DISCONTINUED] TRADJENTA 5 MG TABS tablet Take 1 tablet by mouth once  daily   [DISCONTINUED] traMADol (ULTRAM) 50 MG tablet Take one tab po bid prn for pain   amLODipine (NORVASC) 5 MG tablet Take 1 tablet (5 mg total) by mouth daily.   atorvastatin (LIPITOR) 10 MG tablet Take 1 tablet (10 mg total) by mouth every evening.   citalopram (CELEXA) 20 MG tablet Take 1 tablet (20 mg total) by mouth daily.   donepezil (ARICEPT) 5 MG tablet Take 1 tablet (5 mg total) by mouth in the morning.   linaclotide (LINZESS) 145 MCG CAPS capsule Take 1 capsule (145 mcg total) by mouth at bedtime.   linagliptin (TRADJENTA)  5 MG TABS tablet Take 1 tablet (5 mg total) by mouth daily.   methocarbamol (ROBAXIN) 500 MG tablet Take 1 tablet (500 mg total) by mouth every 8 (eight) hours as needed for muscle spasms.   rOPINIRole (REQUIP) 0.5 MG tablet Take 1 tablet (0.5 mg total) by mouth every evening.   traMADol (ULTRAM) 50 MG tablet Take one tab po bid prn for pain   [DISCONTINUED] acetaminophen (TYLENOL) 325 MG tablet Take 650 mg by mouth every 6 (six) hours as needed. (Patient not taking: Reported on 03/14/2022)   No facility-administered encounter medications on file as of 03/14/2022.    Surgical History: Past Surgical History:  Procedure Laterality Date   ABDOMINAL HYSTERECTOMY     APPENDECTOMY     CATARACT EXTRACTION     CHOLECYSTECTOMY     HERNIA REPAIR     right knee replacement     right nephroectomy      Medical History: Past Medical History:  Diagnosis Date   Anemia    Atrial fibrillation (HCC)    Chronic kidney disease    Congestive heart failure (CHF) (HCC)    Diabetes mellitus without complication (HCC)    GERD (gastroesophageal reflux disease)    Hyperlipidemia    Hypertension    Pneumonia    Pulmonary fibrosis ([place])     Family History: Family History  Problem Relation Age of Onset   Diabetes Mother    Breast cancer Mother    Heart disease Father    Diabetes Father    Diabetes Sister    Diabetes Brother    Cancer Brother     Social History   Socioeconomic History   Marital status: Widowed    Spouse name: Not on file   Number of children: Not on file   Years of education: Not on file   Highest education level: Not on file  Occupational History   Occupation: retired  Tobacco Use   Smoking status: Every Day    Packs/day: 1.00    Types: Cigarettes    Last attempt to quit: 02/10/2021    Years since quitting: 1.2   Smokeless tobacco: Never   Tobacco comments:    1 pack weekly  Vaping Use   Vaping Use: Never used  Substance and Sexual Activity   Alcohol use: No    Drug use: No   Sexual activity: Not Currently  Other Topics Concern   Not on file  Social History Narrative   Lives with daughter   Social Determinants of Health   Financial Resource Strain: Not on file  Food Insecurity: Not on file  Transportation Needs: Not on file  Physical Activity: Not on file  Stress: Not on file  Social Connections: Not on file  Intimate Partner Violence: Not on file      Review of Systems  Constitutional:  Negative for chills, fatigue and  unexpected weight change.  HENT:  Negative for congestion, rhinorrhea, sneezing and sore throat.   Respiratory: Negative.  Negative for cough, chest tightness, shortness of breath and wheezing.   Cardiovascular: Negative.  Negative for chest pain and palpitations.  Gastrointestinal: Negative.  Negative for abdominal pain, constipation, diarrhea, nausea and vomiting.  Genitourinary:  Negative for dysuria and frequency.  Musculoskeletal:  Positive for myalgias (left leg muscle spasms after working hard in physical therapy.). Negative for arthralgias, back pain, joint swelling and neck pain.  Skin:  Negative for rash.  Neurological: Negative.  Negative for tremors and numbness.  Hematological:  Negative for adenopathy. Does not bruise/bleed easily.  Psychiatric/Behavioral:  Negative for behavioral problems (Depression), sleep disturbance and suicidal ideas. The patient is not nervous/anxious.     Vital Signs: BP 112/77   Pulse 99   Temp 98.3 F (36.8 C)   Resp 16   Ht '4\' 10"'$  (1.473 m)   Wt 144 lb (65.3 kg)   SpO2 98%   BMI 30.10 kg/m    Physical Exam Vitals reviewed.  Constitutional:      General: She is not in acute distress.    Appearance: Normal appearance. She is normal weight. She is not ill-appearing.  HENT:     Head: Normocephalic and atraumatic.  Eyes:     Pupils: Pupils are equal, round, and reactive to light.  Cardiovascular:     Rate and Rhythm: Normal rate and regular rhythm.  Pulmonary:      Effort: Pulmonary effort is normal. No respiratory distress.  Neurological:     Mental Status: She is alert and oriented to person, place, and time.     Cranial Nerves: No cranial nerve deficit.     Coordination: Coordination normal.     Gait: Gait (uses cane) normal.  Psychiatric:        Mood and Affect: Mood normal.        Behavior: Behavior normal.        Assessment/Plan: 1. Uncontrolled type 2 diabetes mellitus with hyperglycemia (HCC) A1c dramatically improved down to 7.9 since February when her A1c was 12 and before that 13.5.  Continue Tradjenta as prescribed, refills ordered. - POCT HgB A1C - linagliptin (TRADJENTA) 5 MG TABS tablet; Take 1 tablet (5 mg total) by mouth daily.  Dispense: 90 tablet; Refill: 3  2. Essential hypertension Blood pressure is stable and well-controlled with current medication, amlodipine refills ordered. - amLODipine (NORVASC) 5 MG tablet; Take 1 tablet (5 mg total) by mouth daily.  Dispense: 90 tablet; Refill: 3  3. Muscle strain of left thigh, sequela Tramadol prescribed for muscle strain of left thigh patient encouraged to take over-the-counter medication first and only use tramadol if the over-the-counter medication is not effective - traMADol (ULTRAM) 50 MG tablet; Take one tab po bid prn for pain  Dispense: 180 tablet; Refill: 0 - methocarbamol (ROBAXIN) 500 MG tablet; Take 1 tablet (500 mg total) by mouth every 8 (eight) hours as needed for muscle spasms.  Dispense: 90 tablet; Refill: 1  4. Muscle spasm of left lower extremity Methocarbamol prescribed to alleviate muscle spasms due to muscle strain, take as needed - methocarbamol (ROBAXIN) 500 MG tablet; Take 1 tablet (500 mg total) by mouth every 8 (eight) hours as needed for muscle spasms.  Dispense: 90 tablet; Refill: 1  5. Mild cognitive impairment Stable, refills ordered. - donepezil (ARICEPT) 5 MG tablet; Take 1 tablet (5 mg total) by mouth in the morning.  Dispense: 90 tablet;  Refill: 1  6. Restless leg Stable, refills ordered. - rOPINIRole (REQUIP) 0.5 MG tablet; Take 1 tablet (0.5 mg total) by mouth every evening.  Dispense: 90 tablet; Refill: 1  7. Constipation by delayed colonic transit Chronic issue, Linzess refills ordered. - linaclotide (LINZESS) 145 MCG CAPS capsule; Take 1 capsule (145 mcg total) by mouth at bedtime.  Dispense: 90 capsule; Refill: 1  8. Mixed hyperlipidemia Continue atorvastatin as prescribed, refills ordered. - atorvastatin (LIPITOR) 10 MG tablet; Take 1 tablet (10 mg total) by mouth every evening.  Dispense: 90 tablet; Refill: 1  9. Moderate episode of recurrent major depressive disorder (HCC) Stable with current medication, refills ordered. - citalopram (CELEXA) 20 MG tablet; Take 1 tablet (20 mg total) by mouth daily.  Dispense: 90 tablet; Refill: 3   General Counseling: Namya verbalizes understanding of the findings of todays visit and agrees with plan of treatment. I have discussed any further diagnostic evaluation that may be needed or ordered today. We also reviewed her medications today. she has been encouraged to call the office with any questions or concerns that should arise related to todays visit.    Orders Placed This Encounter  Procedures   POCT HgB A1C    Meds ordered this encounter  Medications   donepezil (ARICEPT) 5 MG tablet    Sig: Take 1 tablet (5 mg total) by mouth in the morning.    Dispense:  90 tablet    Refill:  1   amLODipine (NORVASC) 5 MG tablet    Sig: Take 1 tablet (5 mg total) by mouth daily.    Dispense:  90 tablet    Refill:  3   citalopram (CELEXA) 20 MG tablet    Sig: Take 1 tablet (20 mg total) by mouth daily.    Dispense:  90 tablet    Refill:  3   linaclotide (LINZESS) 145 MCG CAPS capsule    Sig: Take 1 capsule (145 mcg total) by mouth at bedtime.    Dispense:  90 capsule    Refill:  1   linagliptin (TRADJENTA) 5 MG TABS tablet    Sig: Take 1 tablet (5 mg total) by mouth  daily.    Dispense:  90 tablet    Refill:  3   traMADol (ULTRAM) 50 MG tablet    Sig: Take one tab po bid prn for pain    Dispense:  180 tablet    Refill:  0    Please fill as 90 day supply   rOPINIRole (REQUIP) 0.5 MG tablet    Sig: Take 1 tablet (0.5 mg total) by mouth every evening.    Dispense:  90 tablet    Refill:  1    For future refills   atorvastatin (LIPITOR) 10 MG tablet    Sig: Take 1 tablet (10 mg total) by mouth every evening.    Dispense:  90 tablet    Refill:  1    For future refill   methocarbamol (ROBAXIN) 500 MG tablet    Sig: Take 1 tablet (500 mg total) by mouth every 8 (eight) hours as needed for muscle spasms.    Dispense:  90 tablet    Refill:  1    Return for previously scheduled, CPE, Thornton Dohrmann PCP in june.   Total time spent:30 Minutes Time spent includes review of chart, medications, test results, and follow up plan with the patient.   [place] Controlled Substance Database was reviewed by me.  This patient was seen by  Jonetta Osgood, FNP-C in collaboration with Dr. Clayborn Bigness as a part of collaborative care agreement.   Amaliya Whitelaw R. Valetta Fuller, MSN, FNP-C Internal medicine

## 2022-03-14 NOTE — Progress Notes (Signed)
[facility] Consult Note Telephone: [phone]  Fax: [phone]    Date of encounter: 03/14/22 11:41 AM PATIENT NAME: [name] [address] [place] [address]   [phone] (home)  DOB: 04-Jan-1941 MRN: [id number] PRIMARY CARE PROVIDER:    Jonetta Osgood, NP,  [place] [address] [phone]  REFERRING PROVIDER:   Jonetta Osgood, NP [place],  [place] [address] [phone]  RESPONSIBLE PARTY:    Contact Information     Name Relation Home Work Mobile   Rod Can Daughter [phone]  [phone]        I met face to face with patient and family in the home. Palliative Care was asked to follow this patient by consultation request of  Jonetta Osgood, NP to address advance care planning and complex medical decision making. This is a follow up visit.                                   ASSESSMENT AND PLAN / RECOMMENDATIONS:   Advance Care Planning/Goals of Care: Goals include to maximize quality of life and symptom management. Patient/health care surrogate gave his/her permission to discuss. Our advance care planning conversation included a discussion about:    The value and importance of advance care planning  Experiences with loved ones who have been seriously ill or have died  Exploration of personal, cultural or spiritual beliefs that might influence medical decisions  Exploration of goals of care in the event of a sudden injury or illness  Code status reviewed. CODE STATUS: DNR  Symptom Management/Plan:  Heart failure-denies any shortness of breath, edema, fatigue. Continue furosemide as directed; monitor for symptoms. Recommend daily weights.   Back pain-continue tramadol as directed.    Follow up Palliative Care Visit: Palliative care will continue to follow for complex medical decision making, advance care planning, and  clarification of goals. Return in 8 weeks or prn.   This visit was coded based on medical decision making (MDM).  PPS: 50%  HOSPICE ELIGIBILITY/DIAGNOSIS: TBD  Chief Complaint: Palliative Medicine follow up visit.   HISTORY OF PRESENT ILLNESS:  [name] is a 81 y.o. year old female  with chronic heart failure, atrial fibrillation, T2DM, CKD 4, essential hypertension, anemia, hyperlipidemia, depression, left solitary kidney. ED visit on 01/09/22 due to thigh pain. Hospitalized 11/30/21 due to closed fracture of left humerus s/p fall.   Reports doing well. No recent falls; no longer receiving any therapy. Does endorse occasional back pain; relieved with tramadol. Good appetite. Sleeping well. Goes to day program; which she enjoys. Blood sugar 150-170 mg/dL; daughter checks occasionally. No hypoglycemic episodes reported. Daughter checks INR; last INR 1.4. patient has f/u appointment with PCP today. A 10-point ROS is negative, except for the pertinent positives and negatives detailed per the HPI.   History obtained from review of EMR, discussion with primary team, and interview with family, facility staff/caregiver and/or Ms. Bieser.  I reviewed available labs, medications, imaging, studies and related documents from the EMR.  Records reviewed and summarized above.   Physical Exam:  Pulse 92, resp 16, b/p 118/68, sats 98% on room air Constitutional: NAD General: frail appearing EYES: anicteric sclera, lids intact, no discharge  ENMT: intact hearing, oral mucous membranes moist, dentition intact CV: S1S2, Irregular RR, no LE edema Pulmonary: LCTA, no increased work of breathing, no cough,  room air Abdomen: normo-active BS + 4 quadrants, soft and non tender, no ascites GU: deferred MSK:  moves all extremities, ambulatory Skin: warm and dry, no rashes or wounds on visible skin Neuro:  no generalized weakness, A & O x 3 Psych: non-anxious affect, pleasant Hem/lymph/immuno: no widespread  bruising   Thank you for the opportunity to participate in the care of Ms. Leasure.  The palliative care team will continue to follow. Please call our office at [phone] if we can be of additional assistance.   Ezekiel Slocumb, NP   COVID-19 PATIENT SCREENING TOOL Asked and negative response unless otherwise noted:   Have you had symptoms of covid, tested positive or been in contact with someone with symptoms/positive test in the past 5-10 days? No

## 2022-03-22 ENCOUNTER — Telehealth: Payer: Self-pay

## 2022-03-22 NOTE — Telephone Encounter (Signed)
Left vm to confirm 03/29/22 appointment-Toni

## 2022-03-28 ENCOUNTER — Telehealth: Payer: Self-pay

## 2022-03-28 NOTE — Telephone Encounter (Signed)
Left vm to confirm 04/03/22 appointment-Toni

## 2022-03-29 ENCOUNTER — Ambulatory Visit: Payer: Medicare HMO | Admitting: Nurse Practitioner

## 2022-03-29 DIAGNOSIS — Z7901 Long term (current) use of anticoagulants: Secondary | ICD-10-CM | POA: Diagnosis not present

## 2022-03-30 ENCOUNTER — Telehealth: Payer: Self-pay

## 2022-03-30 ENCOUNTER — Ambulatory Visit (INDEPENDENT_AMBULATORY_CARE_PROVIDER_SITE_OTHER): Payer: Medicare HMO

## 2022-03-30 DIAGNOSIS — I4891 Unspecified atrial fibrillation: Secondary | ICD-10-CM | POA: Diagnosis not present

## 2022-03-30 NOTE — Progress Notes (Signed)
Pt  home INR 1.7 as per alyssa no change continue same

## 2022-03-31 ENCOUNTER — Encounter: Payer: Self-pay | Admitting: Nurse Practitioner

## 2022-03-31 LAB — PROTIME-INR

## 2022-04-03 ENCOUNTER — Encounter: Payer: Self-pay | Admitting: Nurse Practitioner

## 2022-04-03 ENCOUNTER — Ambulatory Visit (INDEPENDENT_AMBULATORY_CARE_PROVIDER_SITE_OTHER): Payer: Medicare HMO | Admitting: Nurse Practitioner

## 2022-04-03 VITALS — BP 124/62 | HR 110 | Temp 98.3°F | Resp 16 | Ht <= 58 in | Wt 146.0 lb

## 2022-04-03 DIAGNOSIS — Z0001 Encounter for general adult medical examination with abnormal findings: Secondary | ICD-10-CM | POA: Diagnosis not present

## 2022-04-03 DIAGNOSIS — R300 Dysuria: Secondary | ICD-10-CM | POA: Diagnosis not present

## 2022-04-03 DIAGNOSIS — E782 Mixed hyperlipidemia: Secondary | ICD-10-CM | POA: Diagnosis not present

## 2022-04-03 DIAGNOSIS — N1832 Chronic kidney disease, stage 3b: Secondary | ICD-10-CM

## 2022-04-03 DIAGNOSIS — I5042 Chronic combined systolic (congestive) and diastolic (congestive) heart failure: Secondary | ICD-10-CM | POA: Diagnosis not present

## 2022-04-03 DIAGNOSIS — I10 Essential (primary) hypertension: Secondary | ICD-10-CM

## 2022-04-03 DIAGNOSIS — G3184 Mild cognitive impairment, so stated: Secondary | ICD-10-CM | POA: Diagnosis not present

## 2022-04-03 DIAGNOSIS — K219 Gastro-esophageal reflux disease without esophagitis: Secondary | ICD-10-CM

## 2022-04-03 DIAGNOSIS — F330 Major depressive disorder, recurrent, mild: Secondary | ICD-10-CM

## 2022-04-03 DIAGNOSIS — E1165 Type 2 diabetes mellitus with hyperglycemia: Secondary | ICD-10-CM

## 2022-04-03 NOTE — Progress Notes (Signed)
[facility] [place], [place] [address]  Internal MEDICINE  Office Visit Note  Patient Name: [name]  [id number]  [id number]  Date of Service: 04/03/2022  Chief Complaint  Patient presents with   Medicare Wellness   Hyperlipidemia   Hypertension   Diabetes    HPI [name] presents for an annual well visit and physical exam. She is a well appearing 81 yo female with hypertension, atrial fibrillation, chronic diastolic congestive heart failure, diabetes, CKD stage 4, GERD, and chronic respiratory failure with hypoxia.  She currently lives at home with her daughter. The patient's skin is very thin and fragile and bruises easily.   She is due for her diabetic foot exam and she sees podiatry.  Her A1c was last checked in late may and had significantly improved to 7.9. She is no longer getting screening mammograms and declines a clinical breast exam.  She has no preventive screenings due. She is due for routine labs.  Her blood pressure is stable and controlled with current medications.  Followed by palliative care and cardiology.      Current Medication: Outpatient Encounter Medications as of 04/03/2022  Medication Sig   amLODipine (NORVASC) 5 MG tablet Take 1 tablet (5 mg total) by mouth daily.   atorvastatin (LIPITOR) 10 MG tablet Take 1 tablet (10 mg total) by mouth every evening.   citalopram (CELEXA) 20 MG tablet Take 1 tablet (20 mg total) by mouth daily.   donepezil (ARICEPT) 5 MG tablet Take 1 tablet (5 mg total) by mouth in the morning.   ferrous sulfate 325 (65 FE) MG tablet Take 1 tablet (325 mg total) by mouth daily with breakfast.   insulin detemir (LEVEMIR FLEXTOUCH) 100 UNIT/ML FlexPen Inject 10 Units into the skin daily.   Insulin Pen Needle (PEN NEEDLES 3/16") 31G X 5 MM MISC 1 applicator by Does not apply route 4 (four) times daily.   linaclotide (LINZESS) 145 MCG CAPS capsule Take 1 capsule (145 mcg total) by mouth at bedtime.    linagliptin (TRADJENTA) 5 MG TABS tablet Take 1 tablet (5 mg total) by mouth daily.   melatonin 3 MG TABS tablet Take 3 mg by mouth at bedtime as needed (sleep).   methocarbamol (ROBAXIN) 500 MG tablet Take 1 tablet (500 mg total) by mouth every 8 (eight) hours as needed for muscle spasms.   ondansetron (ZOFRAN-ODT) 4 MG disintegrating tablet Take 1 tablet (4 mg total) by mouth every 6 (six) hours as needed for nausea or vomiting.   pantoprazole (PROTONIX) 40 MG tablet Take 1 tablet by mouth twice daily   rOPINIRole (REQUIP) 0.5 MG tablet Take 1 tablet (0.5 mg total) by mouth every evening.   torsemide (DEMADEX) 20 MG tablet Take 2 tablets (40 mg total) by mouth daily.   traMADol (ULTRAM) 50 MG tablet Take one tab po bid prn for pain   vitamin B-12 1000 MCG tablet Take 1 tablet (1,000 mcg total) by mouth daily.   warfarin (COUMADIN) 2 MG tablet Take 1 tablet (2 mg total) by mouth daily. Except on wed and sat take 4 mg (Patient taking differently: Take 2 mg by mouth daily. Except on MON,wed and sat take 4 mg)   [DISCONTINUED] warfarin (COUMADIN) 4 MG tablet Take 1 tablet (4 mg total) by mouth 2 (two) times a week. On Wednesday and saturday (Patient taking differently: Take 4 mg by mouth 3 (three) times a week. On Monday  Wednesday and saturday)   No facility-administered  encounter medications on file as of 04/03/2022.    Surgical History: Past Surgical History:  Procedure Laterality Date   ABDOMINAL HYSTERECTOMY     APPENDECTOMY     CATARACT EXTRACTION     CHOLECYSTECTOMY     HERNIA REPAIR     right knee replacement     right nephroectomy      Medical History: Past Medical History:  Diagnosis Date   Anemia    Atrial fibrillation (HCC)    Chronic kidney disease    Congestive heart failure (CHF) (HCC)    Diabetes mellitus without complication (HCC)    GERD (gastroesophageal reflux disease)    Hyperlipidemia    Hypertension    Pneumonia    Pulmonary fibrosis ([place])     Family  History: Family History  Problem Relation Age of Onset   Diabetes Mother    Breast cancer Mother    Heart disease Father    Diabetes Father    Diabetes Sister    Diabetes Brother    Cancer Brother     Social History   Socioeconomic History   Marital status: Widowed    Spouse name: Not on file   Number of children: Not on file   Years of education: Not on file   Highest education level: Not on file  Occupational History   Occupation: retired  Tobacco Use   Smoking status: Every Day    Packs/day: 1.00    Types: Cigarettes    Last attempt to quit: 02/10/2021    Years since quitting: 1.2   Smokeless tobacco: Never   Tobacco comments:    1 pack weekly  Vaping Use   Vaping Use: Never used  Substance and Sexual Activity   Alcohol use: No   Drug use: No   Sexual activity: Not Currently  Other Topics Concern   Not on file  Social History Narrative   Lives with daughter   Social Determinants of Health   Financial Resource Strain: Not on file  Food Insecurity: Not on file  Transportation Needs: Not on file  Physical Activity: Not on file  Stress: Not on file  Social Connections: Not on file  Intimate Partner Violence: Not on file      Review of Systems  Constitutional:  Negative for activity change, appetite change, chills, fatigue, fever and unexpected weight change.  HENT: Negative.  Negative for congestion, ear pain, rhinorrhea, sore throat and trouble swallowing.   Eyes: Negative.   Respiratory: Negative.  Negative for cough, chest tightness, shortness of breath and wheezing.   Cardiovascular: Negative.  Negative for chest pain.  Gastrointestinal: Negative.  Negative for abdominal pain, blood in stool, constipation, diarrhea, nausea and vomiting.  Endocrine: Negative.   Genitourinary: Negative.  Negative for difficulty urinating, dysuria, frequency, hematuria and urgency.  Musculoskeletal: Negative.  Negative for arthralgias, back pain, joint swelling, myalgias  and neck pain.  Skin: Negative.  Negative for rash and wound.  Allergic/Immunologic: Negative.  Negative for immunocompromised state.  Neurological: Negative.  Negative for dizziness, seizures, numbness and headaches.  Hematological: Negative.   Psychiatric/Behavioral: Negative.  Negative for behavioral problems, self-injury and suicidal ideas. The patient is not nervous/anxious.     Vital Signs: BP 124/62   Pulse (!) 110   Temp 98.3 F (36.8 C)   Resp 16   Ht 4' 10" (1.473 m)   Wt 146 lb (66.2 kg)   SpO2 96%   BMI 30.51 kg/m    Physical Exam Vitals reviewed.  Constitutional:  General: She is awake. She is not in acute distress.    Appearance: Normal appearance. She is well-developed and well-groomed. She is obese. She is not ill-appearing or diaphoretic.  HENT:     Head: Normocephalic and atraumatic.     Right Ear: Tympanic membrane, ear canal and external ear normal.     Left Ear: Tympanic membrane, ear canal and external ear normal.     Nose: Nose normal. No congestion or rhinorrhea.     Mouth/Throat:     Lips: Pink.     Mouth: Mucous membranes are moist.     Pharynx: Oropharynx is clear. Uvula midline. No oropharyngeal exudate or posterior oropharyngeal erythema.  Eyes:     General: Lids are normal. Vision grossly intact. Gaze aligned appropriately. No scleral icterus.       Right eye: No discharge.        Left eye: No discharge.     Extraocular Movements: Extraocular movements intact.     Conjunctiva/sclera: Conjunctivae normal.     Pupils: Pupils are equal, round, and reactive to light.     Funduscopic exam:    Right eye: Red reflex present.        Left eye: Red reflex present. Neck:     Thyroid: No thyromegaly.     Vascular: No JVD.     Trachea: No tracheal deviation.  Cardiovascular:     Rate and Rhythm: Normal rate and regular rhythm.     Pulses: Normal pulses.          Dorsalis pedis pulses are 2+ on the right side and 2+ on the left side.        Posterior tibial pulses are 2+ on the right side and 2+ on the left side.     Heart sounds: Normal heart sounds, S1 normal and S2 normal. No murmur heard.    No friction rub. No gallop.  Pulmonary:     Effort: Pulmonary effort is normal. No accessory muscle usage or respiratory distress.     Breath sounds: Normal breath sounds and air entry. No stridor. No wheezing or rales.  Chest:     Chest wall: No tenderness.     Comments: Declined clinical breast exam.  Abdominal:     General: Bowel sounds are normal. There is no distension.     Palpations: Abdomen is soft. There is no mass.     Tenderness: There is no abdominal tenderness. There is no guarding or rebound.  Musculoskeletal:        General: No tenderness or deformity. Normal range of motion.     Cervical back: Normal range of motion and neck supple.     Right lower leg: 1+ Pitting Edema present.     Left lower leg: 1+ Pitting Edema present.     Right foot: Normal range of motion. No deformity, bunion, Charcot foot, foot drop or prominent metatarsal heads.     Left foot: Normal range of motion. No deformity, bunion, Charcot foot, foot drop or prominent metatarsal heads.  Feet:     Right foot:     Protective Sensation: 6 sites tested.  6 sites sensed.     Skin integrity: Dry skin present. No ulcer, blister, skin breakdown, erythema, warmth, callus or fissure.     Toenail Condition: Right toenails are normal.     Left foot:     Protective Sensation: 6 sites tested.  6 sites sensed.     Skin integrity: Dry skin present. No ulcer, blister,  skin breakdown, erythema, warmth, callus or fissure.     Toenail Condition: Left toenails are normal.  Lymphadenopathy:     Cervical: No cervical adenopathy.  Skin:    General: Skin is warm and dry.     Coloration: Skin is not pale.     Findings: No erythema or rash.  Neurological:     Mental Status: She is alert and oriented to person, place, and time.     Cranial Nerves: No cranial nerve  deficit.     Motor: No abnormal muscle tone.     Coordination: Coordination normal.     Gait: Gait normal.     Deep Tendon Reflexes: Reflexes are normal and symmetric.  Psychiatric:        Mood and Affect: Mood and affect normal.        Behavior: Behavior normal. Behavior is cooperative.        Thought Content: Thought content normal.        Judgment: Judgment normal.        Assessment/Plan: 1. Encounter for general adult medical examination with abnormal findings Age-appropriate preventive screenings and vaccinations discussed, annual physical exam completed. Routine labs for health maintenance ordered, see below. PHM updated.  - CBC with Differential/Platelet - CMP14+EGFR - Lipid Profile - TSH + free T4  2. Uncontrolled type 2 diabetes mellitus with hyperglycemia (HCC) A1C improving as of may with 7.9. will repeat A1c in late august.  - CBC with Differential/Platelet - CMP14+EGFR - Lipid Profile - TSH + free T4  3. Essential hypertension BP stable with current medications, routine labs ordered - CBC with Differential/Platelet - CMP14+EGFR - Lipid Profile - TSH + free T4  4. Stage 3b chronic kidney disease ([place]) Routine labs ordered - CBC with Differential/Platelet - CMP14+EGFR - Lipid Profile - TSH + free T4  5. Mild cognitive impairment Taking donepezil - CBC with Differential/Platelet - CMP14+EGFR - Lipid Profile - TSH + free T4  6. Chronic combined systolic and diastolic heart failure (HCC) Followed by cardiology, routine labs ordered - CBC with Differential/Platelet - CMP14+EGFR - Lipid Profile - TSH + free T4  7. Gastroesophageal reflux disease without esophagitis Stable, continue pantoprazole - CBC with Differential/Platelet - CMP14+EGFR - Lipid Profile - TSH + free T4  8. Mixed hyperlipidemia Continue atorvastatin as prescribed.  - CBC with Differential/Platelet - CMP14+EGFR - Lipid Profile - TSH + free T4  9. Dysuria - UA/M w/rflx  Culture, Routine - CBC with Differential/Platelet - Microscopic Examination - Urine Culture, Reflex  10. Mild episode of recurrent major depressive disorder (HCC) Stable, no changes      General Counseling: [name] verbalizes understanding of the findings of todays visit and agrees with plan of treatment. I have discussed any further diagnostic evaluation that may be needed or ordered today. We also reviewed her medications today. she has been encouraged to call the office with any questions or concerns that should arise related to todays visit.    Orders Placed This Encounter  Procedures   Microscopic Examination   Urine Culture, Reflex   UA/M w/rflx Culture, Routine   CBC with Differential/Platelet   CMP14+EGFR   Lipid Profile   TSH + free T4    No orders of the defined types were placed in this encounter.   Return in about 2 months (around 06/15/2022) for F/U, Recheck A1C, Lachina Salsberry PCP.   Total time spent:30 Minutes Time spent includes review of chart, medications, test results, and follow up plan with the patient.  [place] Controlled Substance Database was reviewed by me.  This patient was seen by Jonetta Osgood, FNP-C in collaboration with Dr. Clayborn Bigness as a part of collaborative care agreement.  Shakeita Vandevander R. Valetta Fuller, MSN, FNP-C Internal medicine

## 2022-04-07 LAB — URINE CULTURE, REFLEX

## 2022-04-07 LAB — UA/M W/RFLX CULTURE, ROUTINE
Bilirubin, UA: NEGATIVE
Glucose, UA: NEGATIVE
Ketones, UA: NEGATIVE
Nitrite, UA: NEGATIVE
Specific Gravity, UA: 1.008 (ref 1.005–1.030)
Urobilinogen, Ur: 0.2 mg/dL (ref 0.2–1.0)
pH, UA: 5 (ref 5.0–7.5)

## 2022-04-07 LAB — MICROSCOPIC EXAMINATION
Casts: NONE SEEN /lpf
WBC, UA: >30 /hpf — AB (ref 0–5)

## 2022-04-19 ENCOUNTER — Telehealth: Payer: Self-pay

## 2022-04-19 ENCOUNTER — Ambulatory Visit (INDEPENDENT_AMBULATORY_CARE_PROVIDER_SITE_OTHER): Payer: Medicare HMO

## 2022-04-19 DIAGNOSIS — I4891 Unspecified atrial fibrillation: Secondary | ICD-10-CM | POA: Diagnosis not present

## 2022-04-19 NOTE — Telephone Encounter (Signed)
[name] several message daughter  to call us back regarding INR

## 2022-04-19 NOTE — Telephone Encounter (Signed)
[name] to call us back regarding INR

## 2022-04-19 NOTE — Progress Notes (Signed)
Pt INR 1.5 as per alyssa she take Coumadin 4 mg Mon,wed and Sat  and she can take coumadin 2 mg Tues,Thursday,Friday and sun  and repeat in INR in 1 week

## 2022-04-20 ENCOUNTER — Ambulatory Visit: Payer: Medicare HMO | Admitting: Podiatry

## 2022-04-20 ENCOUNTER — Encounter: Payer: Self-pay | Admitting: Podiatry

## 2022-04-20 ENCOUNTER — Encounter: Payer: Self-pay | Admitting: Nurse Practitioner

## 2022-04-20 DIAGNOSIS — M79675 Pain in left toe(s): Secondary | ICD-10-CM

## 2022-04-20 DIAGNOSIS — E1149 Type 2 diabetes mellitus with other diabetic neurological complication: Secondary | ICD-10-CM

## 2022-04-20 DIAGNOSIS — Z794 Long term (current) use of insulin: Secondary | ICD-10-CM

## 2022-04-20 DIAGNOSIS — N184 Chronic kidney disease, stage 4 (severe): Secondary | ICD-10-CM | POA: Diagnosis not present

## 2022-04-20 DIAGNOSIS — M79674 Pain in right toe(s): Secondary | ICD-10-CM | POA: Diagnosis not present

## 2022-04-20 DIAGNOSIS — B351 Tinea unguium: Secondary | ICD-10-CM | POA: Diagnosis not present

## 2022-04-20 LAB — PROTIME-INR

## 2022-04-20 NOTE — Progress Notes (Signed)
This patient returns to my office for at risk foot care.  This patient requires this care by a professional since this patient will be at risk due to having coagulation defect. CKD amd DM with kidney disease. She presents to the office with her daughter. this patient is unable to cut nails herself since the patient cannot reach her nails.These nails are painful walking and wearing shoes.  This patient presents for at risk foot care today.  General Appearance  Alert, conversant and in no acute stress.  Vascular  Dorsalis pedis and posterior tibial  pulses are not  palpable  bilaterally.  Capillary return is within normal limits  bilaterally. Cold feetbilaterally.  Neurologic  Senn-Weinstein monofilament wire test within normal limits  bilaterally. Muscle power within normal limits bilaterally.  Nails Thick disfigured discolored nails with subungual debris  from hallux to fifth toes bilaterally. No evidence of bacterial infection or drainage bilaterally. Marked incurvation medial border  with severe pain upon palpation.  Orthopedic  No limitations of motion  feet .  No crepitus or effusions noted.  No bony pathology or digital deformities noted.  Skin  normotropic skin with no porokeratosis noted bilaterally.  No signs of infections or ulcers noted.     Onychomycosis  Pain in right toes  Pain in left toe   Consent was obtained for treatment procedures.   Mechanical debridement of nails 1-5  bilaterally performed with a nail nipper.  Filed with dremel without incident.    Return office visit   4 months                  Told patient to return for periodic foot care and evaluation due to potential at risk complications.   Gardiner Barefoot DPM

## 2022-05-03 ENCOUNTER — Ambulatory Visit (INDEPENDENT_AMBULATORY_CARE_PROVIDER_SITE_OTHER): Payer: Medicare HMO

## 2022-05-03 DIAGNOSIS — I4891 Unspecified atrial fibrillation: Secondary | ICD-10-CM

## 2022-05-03 NOTE — Progress Notes (Signed)
Pt INR 2.0 as per alyssa no change continue same  Coumadin 4 mg Mon,wed and Sat  and she can take coumadin 2 mg Tues,Thursday,Friday and sun  and repeat in INR in 1 week

## 2022-05-04 ENCOUNTER — Encounter: Payer: Self-pay | Admitting: Nurse Practitioner

## 2022-05-04 LAB — PROTIME-INR

## 2022-05-11 ENCOUNTER — Other Ambulatory Visit: Payer: Medicare HMO | Admitting: Student

## 2022-05-11 DIAGNOSIS — I480 Paroxysmal atrial fibrillation: Secondary | ICD-10-CM

## 2022-05-11 DIAGNOSIS — Z515 Encounter for palliative care: Secondary | ICD-10-CM

## 2022-05-11 DIAGNOSIS — I5042 Chronic combined systolic (congestive) and diastolic (congestive) heart failure: Secondary | ICD-10-CM

## 2022-05-11 NOTE — Progress Notes (Signed)
[facility] Consult Note Telephone: [phone]  Fax: [phone]    Date of encounter: 05/11/22 9:18 AM PATIENT NAME: [name] [name] [name] [id number]   [phone] (home)  DOB: 1941/04/27 MRN: [id number] PRIMARY CARE PROVIDER:    Jonetta Osgood, NP,  [name] [id number] [phone]  REFERRING PROVIDER:   Jonetta Osgood, NP [name],  [name] [id number] [phone]  RESPONSIBLE PARTY:    Contact Information     Name Relation Home Work Mobile   [name] [name] [phone]  [phone]        I met face to face with patient and family in the home. Palliative Care was asked to follow this patient by consultation request of  Amy Osgood, NP to address advance care planning and complex medical decision making. This is a follow up visit.                                   ASSESSMENT AND PLAN / RECOMMENDATIONS:   Advance Care Planning/Goals of Care: Goals include to maximize quality of life and symptom management. Patient/health care surrogate gave his/her permission to discuss.  CODE STATUS: DNR  Education provided on Palliative Medicine. Will continue to provide supportive care, symptom management as needed.  Symptom Management/Plan:  Chronic combined systolic and diastolic Heart failure-patient has been stable; no worsening symptom such as fatigue, shortness of breath, edema. Continue torsemide as directed. Monitor for worsening symptoms.   Atrial fibrillation-continue on warfarin as directed; currently alternating 90m with 480m [name] checks INR every other week; usually around 2.   Follow up Palliative Care Visit: Palliative care will continue to follow for complex medical decision making, advance care planning, and clarification of goals. Return in 10-12 weeks or prn.   This visit was coded based on medical decision  making (MDM).  PPS: 50%  HOSPICE ELIGIBILITY/DIAGNOSIS: TBD  Chief Complaint: Palliative Medicine follow up visit.   HISTORY OF PRESENT ILLNESS:  Amy LAGOYs a [age].o. year old female  with chronic combined diastolic and systolic heart failure, atrial fibrillation, [name], [name], essential hypertension, anemia, hyperlipidemia, depression, left solitary kidney.   Patient reports doing well. Denies pain, chest pain, shortness of breath. Good appetite; [name] checking blood sugar daily, has been around 120 mg/dL. INR checked every other week; has been around 2. No falls reported. Patient continues to go to day program; enjoys going. She is sleeping well. No recent ED visits or hospitalizations.  History obtained from review of EMR, discussion with primary team, and interview with family, facility staff/caregiver and/or [name].  I reviewed available labs, medications, imaging, studies and related documents from the EMR.  Records reviewed and summarized above.   ROS  10-point ROS is negative, except for the pertinent positives and negatives detailed per the HPI.  Physical Exam: Pulse 104, resp 18, b/p 122/80, sats 99% on room air. Constitutional: NAD General: frail appearing EYES: anicteric sclera, lids intact, no discharge  ENMT: intact hearing, oral mucous membranes moist CV: apical irregular,  no LE edema Pulmonary: LCTA, no increased work of breathing, no cough, room air Abdomen:  normo-active BS + [name] quadrants, soft and non tender, no ascites GU: deferred MSK: moves all extremities, ambulatory Skin: warm and dry, no rashes or wounds on visible skin Neuro:  no generalized weakness, A & x  2 Psych: non-anxious affect, pleasant Hem/lymph/immuno: no widespread bruising   Thank you for the opportunity to participate in the care of [name].  The palliative care team will continue to follow. Please call our office at [phone] if we [name] be of additional assistance.    Ezekiel Slocumb, NP   COVID-19 PATIENT SCREENING TOOL Asked and negative response unless otherwise noted:   Have you had symptoms of covid, tested positive or been in contact with someone with symptoms/positive test in the past 5-10 days? nO

## 2022-05-22 ENCOUNTER — Other Ambulatory Visit: Payer: Self-pay | Admitting: Nurse Practitioner

## 2022-05-22 DIAGNOSIS — I4891 Unspecified atrial fibrillation: Secondary | ICD-10-CM

## 2022-05-24 ENCOUNTER — Ambulatory Visit (INDEPENDENT_AMBULATORY_CARE_PROVIDER_SITE_OTHER): Payer: Medicare HMO

## 2022-05-24 DIAGNOSIS — I4891 Unspecified atrial fibrillation: Secondary | ICD-10-CM

## 2022-05-24 NOTE — Progress Notes (Signed)
Pt INR 2.0 as per [name] no change continue same  Coumadin 4 mg Mon,wed and Sat  and she can take coumadin 2 mg Tues,Thursday,Friday and sun  and repeat in INR

## 2022-05-25 ENCOUNTER — Encounter: Payer: Self-pay | Admitting: Nurse Practitioner

## 2022-05-26 LAB — PROTIME-INR

## 2022-05-29 DIAGNOSIS — M79645 Pain in left finger(s): Secondary | ICD-10-CM | POA: Diagnosis not present

## 2022-05-29 DIAGNOSIS — M25532 Pain in left wrist: Secondary | ICD-10-CM | POA: Diagnosis not present

## 2022-05-29 DIAGNOSIS — M25542 Pain in joints of left hand: Secondary | ICD-10-CM | POA: Insufficient documentation

## 2022-06-05 ENCOUNTER — Ambulatory Visit: Payer: Medicare HMO | Admitting: Nurse Practitioner

## 2022-06-07 DIAGNOSIS — S63502D Unspecified sprain of left wrist, subsequent encounter: Secondary | ICD-10-CM | POA: Diagnosis not present

## 2022-06-19 ENCOUNTER — Ambulatory Visit (INDEPENDENT_AMBULATORY_CARE_PROVIDER_SITE_OTHER): Payer: Medicare HMO | Admitting: Nurse Practitioner

## 2022-06-19 DIAGNOSIS — I480 Paroxysmal atrial fibrillation: Secondary | ICD-10-CM

## 2022-06-19 DIAGNOSIS — S92512A Displaced fracture of proximal phalanx of left lesser toe(s), initial encounter for closed fracture: Secondary | ICD-10-CM | POA: Diagnosis not present

## 2022-06-19 DIAGNOSIS — Z7901 Long term (current) use of anticoagulants: Secondary | ICD-10-CM | POA: Diagnosis not present

## 2022-06-19 NOTE — Progress Notes (Unsigned)
Pt INR 2.0 as per alyssa no change continue same  Coumadin 4 mg Mon,wed and Sat  and she can take coumadin 2 mg Tues,Thursday,Friday and sun  and repeat in INR

## 2022-06-20 ENCOUNTER — Encounter: Payer: Self-pay | Admitting: Nurse Practitioner

## 2022-06-20 ENCOUNTER — Ambulatory Visit (INDEPENDENT_AMBULATORY_CARE_PROVIDER_SITE_OTHER): Payer: Medicare HMO | Admitting: Nurse Practitioner

## 2022-06-20 VITALS — BP 93/60 | HR 96 | Temp 97.9°F | Resp 16 | Ht <= 58 in | Wt 150.0 lb

## 2022-06-20 DIAGNOSIS — E1165 Type 2 diabetes mellitus with hyperglycemia: Secondary | ICD-10-CM

## 2022-06-20 DIAGNOSIS — Z760 Encounter for issue of repeat prescription: Secondary | ICD-10-CM | POA: Diagnosis not present

## 2022-06-20 DIAGNOSIS — I480 Paroxysmal atrial fibrillation: Secondary | ICD-10-CM | POA: Diagnosis not present

## 2022-06-20 DIAGNOSIS — S76912S Strain of unspecified muscles, fascia and tendons at thigh level, left thigh, sequela: Secondary | ICD-10-CM

## 2022-06-20 LAB — POCT GLYCOSYLATED HEMOGLOBIN (HGB A1C): Hemoglobin A1C: 7.5 % — AB (ref 4.0–5.6)

## 2022-06-20 LAB — PROTIME-INR

## 2022-06-20 MED ORDER — TRAMADOL HCL 50 MG PO TABS: ORAL_TABLET | ORAL | 0 refills | Status: DC

## 2022-06-20 MED ORDER — MONTELUKAST SODIUM 10 MG PO TABS: 10.0000 mg | ORAL_TABLET | Freq: Every day | ORAL | 3 refills | Status: DC
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
## 2022-07-04 ENCOUNTER — Telehealth: Payer: Self-pay | Admitting: *Deleted

## 2022-07-04 NOTE — Patient Outreach (Signed)
  Care Coordination   07/04/2022 Name: [name] MRN: [id number] DOB: 10/21/1941   Care Coordination Outreach Attempts:  An unsuccessful telephone outreach was attempted today to offer the patient information about available care coordination services as a benefit of their health plan.   Follow Up Plan:  Additional outreach attempts will be made to offer the patient care coordination information and services.   Encounter Outcome:  No Answer  Care Coordination Interventions Activated:  Yes   Care Coordination Interventions:  No, not indicated    [place] [facility] [phone]

## 2022-07-17 DIAGNOSIS — S92512A Displaced fracture of proximal phalanx of left lesser toe(s), initial encounter for closed fracture: Secondary | ICD-10-CM | POA: Diagnosis not present

## 2022-07-26 DIAGNOSIS — M25532 Pain in left wrist: Secondary | ICD-10-CM | POA: Diagnosis not present

## 2022-07-26 DIAGNOSIS — M654 Radial styloid tenosynovitis [de Quervain]: Secondary | ICD-10-CM | POA: Diagnosis not present

## 2022-08-10 ENCOUNTER — Other Ambulatory Visit: Payer: Medicare HMO | Admitting: Student

## 2022-08-10 DIAGNOSIS — I480 Paroxysmal atrial fibrillation: Secondary | ICD-10-CM

## 2022-08-10 DIAGNOSIS — G8929 Other chronic pain: Secondary | ICD-10-CM

## 2022-08-10 DIAGNOSIS — I5042 Chronic combined systolic (congestive) and diastolic (congestive) heart failure: Secondary | ICD-10-CM

## 2022-08-10 DIAGNOSIS — Z515 Encounter for palliative care: Secondary | ICD-10-CM
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
## 2022-08-17 ENCOUNTER — Ambulatory Visit (INDEPENDENT_AMBULATORY_CARE_PROVIDER_SITE_OTHER): Payer: Medicare HMO | Admitting: Nurse Practitioner

## 2022-08-17 ENCOUNTER — Telehealth: Payer: Self-pay

## 2022-08-17 DIAGNOSIS — I4891 Unspecified atrial fibrillation: Secondary | ICD-10-CM

## 2022-08-17 LAB — POCT INR: INR: 1.8 — AB (ref 2.0–3.0)

## 2022-08-17 NOTE — Progress Notes (Signed)
Per Alyssa, patient will take an extra '2mg'$  of Coumadin today. Patient will continue taking Coumadin '2mg'$  every Tuesday, Friday and Sunday. Patient will take Coumadin '4mg'$  every Monday, Wednesday, Thursday and Saturday. Will check INR again in 1 month per Alyssa.

## 2022-08-17 NOTE — Telephone Encounter (Signed)
Left a message for patient's daughter regarding Home INR results. Asked her to please call back.

## 2022-08-18 LAB — PROTIME-INR

## 2022-08-20 ENCOUNTER — Encounter: Payer: Self-pay | Admitting: Nurse Practitioner

## 2022-08-23 ENCOUNTER — Ambulatory Visit (INDEPENDENT_AMBULATORY_CARE_PROVIDER_SITE_OTHER): Payer: Medicare HMO | Admitting: Nurse Practitioner

## 2022-08-23 ENCOUNTER — Telehealth: Payer: Self-pay

## 2022-08-23 DIAGNOSIS — I480 Paroxysmal atrial fibrillation: Secondary | ICD-10-CM | POA: Diagnosis not present

## 2022-08-23 LAB — POCT INR: INR: 2.4 (ref 2.0–3.0)

## 2022-08-23 NOTE — Progress Notes (Signed)
Per Alyssa, patient is instructed to continue the same treatment. Mon, Wed, and Sat '4mg'$  Coumadin, and the following days take '2mg'$  Coumadin.

## 2022-08-23 NOTE — Telephone Encounter (Signed)
Left message for patient's daughter about at-home INR results.

## 2022-08-24 ENCOUNTER — Encounter: Payer: Self-pay | Admitting: Podiatry

## 2022-08-24 ENCOUNTER — Ambulatory Visit: Payer: Medicare HMO | Admitting: Podiatry

## 2022-08-24 DIAGNOSIS — M79675 Pain in left toe(s): Secondary | ICD-10-CM

## 2022-08-24 DIAGNOSIS — N184 Chronic kidney disease, stage 4 (severe): Secondary | ICD-10-CM | POA: Diagnosis not present

## 2022-08-24 DIAGNOSIS — M79674 Pain in right toe(s): Secondary | ICD-10-CM | POA: Diagnosis not present

## 2022-08-24 DIAGNOSIS — Z794 Long term (current) use of insulin: Secondary | ICD-10-CM

## 2022-08-24 DIAGNOSIS — E1149 Type 2 diabetes mellitus with other diabetic neurological complication: Secondary | ICD-10-CM

## 2022-08-24 DIAGNOSIS — B351 Tinea unguium: Secondary | ICD-10-CM

## 2022-08-24 LAB — PROTIME-INR

## 2022-08-24 NOTE — Progress Notes (Signed)
This patient returns to my office for at risk foot care.  This patient requires this care by a professional since this patient will be at risk due to having coagulation defect. CKD amd DM with kidney disease. She presents to the office with her granddaughter. this patient is unable to cut nails herself since the patient cannot reach her nails.These nails are painful walking and wearing shoes.  This patient presents for at risk foot care today.  General Appearance  Alert, conversant and in no acute stress.  Vascular  Dorsalis pedis and posterior tibial  pulses are not  palpable  bilaterally.  Capillary return is within normal limits  bilaterally. Cold feetbilaterally.  Neurologic  Senn-Weinstein monofilament wire test within normal limits  bilaterally. Muscle power within normal limits bilaterally.  Nails Thick disfigured discolored nails with subungual debris  from hallux to fifth toes bilaterally. No evidence of bacterial infection or drainage bilaterally. Marked incurvation medial border  with severe pain upon palpation.  Orthopedic  No limitations of motion  feet .  No crepitus or effusions noted.  No bony pathology or digital deformities noted.  Skin  normotropic skin with no porokeratosis noted bilaterally.  No signs of infections or ulcers noted.     Onychomycosis  Pain in right toes  Pain in left toe   Consent was obtained for treatment procedures.   Mechanical debridement of nails 1-5  bilaterally performed with a nail nipper.  Filed with dremel without incident.    Return office visit   4 months                  Told patient to return for periodic foot care and evaluation due to potential at risk complications.   Gardiner Barefoot DPM

## 2022-08-27 ENCOUNTER — Other Ambulatory Visit: Payer: Self-pay | Admitting: Nurse Practitioner

## 2022-08-27 DIAGNOSIS — K219 Gastro-esophageal reflux disease without esophagitis: Secondary | ICD-10-CM

## 2022-08-30 ENCOUNTER — Other Ambulatory Visit: Payer: Self-pay | Admitting: Nurse Practitioner

## 2022-08-30 DIAGNOSIS — I5042 Chronic combined systolic (congestive) and diastolic (congestive) heart failure: Secondary | ICD-10-CM

## 2022-09-07 ENCOUNTER — Ambulatory Visit (INDEPENDENT_AMBULATORY_CARE_PROVIDER_SITE_OTHER): Payer: Medicare HMO

## 2022-09-07 DIAGNOSIS — I4891 Unspecified atrial fibrillation: Secondary | ICD-10-CM

## 2022-09-07 DIAGNOSIS — Z7901 Long term (current) use of anticoagulants: Secondary | ICD-10-CM | POA: Diagnosis not present

## 2022-09-07 NOTE — Progress Notes (Signed)
Pt INR 2.0 as per alyssa no change continue same med MON ,WED AND SAT coumadin  4 mg and then take coumadin 2 mg rest of the day

## 2022-09-08 LAB — PROTIME-INR

## 2022-09-19 ENCOUNTER — Ambulatory Visit: Payer: Medicare HMO | Admitting: Nurse Practitioner

## 2022-09-20 ENCOUNTER — Ambulatory Visit (INDEPENDENT_AMBULATORY_CARE_PROVIDER_SITE_OTHER): Payer: Medicare HMO

## 2022-09-20 DIAGNOSIS — I4891 Unspecified atrial fibrillation: Secondary | ICD-10-CM

## 2022-09-20 NOTE — Progress Notes (Addendum)
Pt  INR  2.0 as per alyssa no change continue same med MON ,WED AND SAT coumadin 4 mg and then take coumadin 2 mg rest of the week

## 2022-09-21 ENCOUNTER — Encounter: Payer: Self-pay | Admitting: Nurse Practitioner

## 2022-09-21 ENCOUNTER — Ambulatory Visit (INDEPENDENT_AMBULATORY_CARE_PROVIDER_SITE_OTHER): Payer: Medicare HMO | Admitting: Nurse Practitioner

## 2022-09-21 VITALS — BP 105/39 | HR 94 | Temp 97.8°F | Resp 16 | Ht <= 58 in | Wt 153.0 lb

## 2022-09-21 DIAGNOSIS — E1165 Type 2 diabetes mellitus with hyperglycemia: Secondary | ICD-10-CM

## 2022-09-21 DIAGNOSIS — Z760 Encounter for issue of repeat prescription: Secondary | ICD-10-CM

## 2022-09-21 DIAGNOSIS — M150 Primary generalized (osteo)arthritis: Secondary | ICD-10-CM

## 2022-09-21 DIAGNOSIS — I10 Essential (primary) hypertension: Secondary | ICD-10-CM | POA: Diagnosis not present

## 2022-09-21 LAB — POCT GLYCOSYLATED HEMOGLOBIN (HGB A1C): Hemoglobin A1C: 8.1 % — AB (ref 4.0–5.6)

## 2022-09-21 LAB — PROTIME-INR

## 2022-09-21 MED ORDER — TRAMADOL HCL 50 MG PO TABS: ORAL_TABLET | ORAL | 0 refills | Status: DC

## 2022-09-21 MED ORDER — CYCLOBENZAPRINE HCL 5 MG PO TABS: 5.0000 mg | ORAL_TABLET | Freq: Three times a day (TID) | ORAL | 1 refills | Status: DC | PRN

## 2022-09-21 NOTE — Progress Notes (Signed)
[facility] [place], [place] [address]  Internal MEDICINE  Office Visit Note  Patient Name: [name]  [id number]  [id number]  Date of Service: 09/21/2022  Chief Complaint  Patient presents with   Follow-up   Diabetes   Gastroesophageal Reflux   Hypertension   Hyperlipidemia    HPI Jaylei presents for a follow up visit for diabetes, hypertension, and hyperlipidemia.  Diabetes -- A1c elevated at 8.1. forgot injections for sugar while visiting with family out of town, ate very well for thanksgiving  Hypertension -- controlled with current medications.  High cholesterol -- takes atorvastatin   Current Medication: Outpatient Encounter Medications as of 09/21/2022  Medication Sig   amLODipine (NORVASC) 5 MG tablet Take 1 tablet (5 mg total) by mouth daily.   atorvastatin (LIPITOR) 10 MG tablet Take 1 tablet (10 mg total) by mouth every evening.   citalopram (CELEXA) 20 MG tablet Take 1 tablet (20 mg total) by mouth daily.   cyclobenzaprine (FLEXERIL) 5 MG tablet Take 1 tablet (5 mg total) by mouth 3 (three) times daily as needed for muscle spasms.   donepezil (ARICEPT) 5 MG tablet Take 1 tablet (5 mg total) by mouth in the morning.   ferrous sulfate 325 (65 FE) MG tablet Take 1 tablet (325 mg total) by mouth daily with breakfast.   insulin detemir (LEVEMIR FLEXTOUCH) 100 UNIT/ML FlexPen Inject 10 Units into the skin daily.   Insulin Pen Needle (PEN NEEDLES 3/16") 31G X 5 MM MISC 1 applicator by Does not apply route 4 (four) times daily.   linaclotide (LINZESS) 145 MCG CAPS capsule Take 1 capsule (145 mcg total) by mouth at bedtime.   linagliptin (TRADJENTA) 5 MG TABS tablet Take 1 tablet (5 mg total) by mouth daily.   melatonin 3 MG TABS tablet Take 3 mg by mouth at bedtime as needed (sleep).   montelukast (SINGULAIR) 10 MG tablet Take 1 tablet (10 mg total) by mouth at bedtime.   ondansetron (ZOFRAN-ODT) 4 MG disintegrating tablet Take 1 tablet (4 mg  total) by mouth every 6 (six) hours as needed for nausea or vomiting.   pantoprazole (PROTONIX) 40 MG tablet Take 1 tablet by mouth twice daily   rOPINIRole (REQUIP) 0.5 MG tablet Take 1 tablet (0.5 mg total) by mouth every evening.   torsemide (DEMADEX) 20 MG tablet Take 2 tablets by mouth once daily   vitamin B-12 1000 MCG tablet Take 1 tablet (1,000 mcg total) by mouth daily.   warfarin (COUMADIN) 2 MG tablet Take 1 tablet (2 mg total) by mouth daily. Except on wed and sat take 4 mg (Patient taking differently: Take 2 mg by mouth daily. Except on MON,wed and sat take 4 mg)   warfarin (COUMADIN) 4 MG tablet TAKE 1 TABLET BY MOUTH ON WEDNESDAYS AND SATURDAYS ONLY   [DISCONTINUED] methocarbamol (ROBAXIN) 500 MG tablet Take 1 tablet (500 mg total) by mouth every 8 (eight) hours as needed for muscle spasms.   [DISCONTINUED] traMADol (ULTRAM) 50 MG tablet Take one tab po bid prn for pain   traMADol (ULTRAM) 50 MG tablet Take one tab po bid prn for pain   No facility-administered encounter medications on file as of 09/21/2022.    Surgical History: Past Surgical History:  Procedure Laterality Date   ABDOMINAL HYSTERECTOMY     APPENDECTOMY     CATARACT EXTRACTION     CHOLECYSTECTOMY     HERNIA REPAIR     right knee replacement  right nephroectomy      Medical History: Past Medical History:  Diagnosis Date   Anemia    Atrial fibrillation (HCC)    Chronic kidney disease    Congestive heart failure (CHF) (HCC)    Diabetes mellitus without complication (HCC)    GERD (gastroesophageal reflux disease)    Hyperlipidemia    Hypertension    Pneumonia    Pulmonary fibrosis ([place])     Family History: Family History  Problem Relation Age of Onset   Diabetes Mother    Breast cancer Mother    Heart disease Father    Diabetes Father    Diabetes Sister    Diabetes Brother    Cancer Brother     Social History   Socioeconomic History   Marital status: Widowed    Spouse name: Not on  file   Number of children: Not on file   Years of education: Not on file   Highest education level: Not on file  Occupational History   Occupation: retired  Tobacco Use   Smoking status: Every Day    Packs/day: 1.00    Types: Cigarettes    Last attempt to quit: 02/10/2021    Years since quitting: 1.6   Smokeless tobacco: Never   Tobacco comments:    1 pack daily---quit 1 month   Vaping Use   Vaping Use: Never used  Substance and Sexual Activity   Alcohol use: No   Drug use: No   Sexual activity: Not Currently  Other Topics Concern   Not on file  Social History Narrative   Lives with daughter   Social Determinants of Health   Financial Resource Strain: Not on file  Food Insecurity: Not on file  Transportation Needs: Not on file  Physical Activity: Not on file  Stress: Not on file  Social Connections: Not on file  Intimate Partner Violence: Not on file      Review of Systems  Constitutional:  Negative for chills, fatigue and unexpected weight change.  HENT: Negative.  Negative for congestion, rhinorrhea, sneezing and sore throat.   Eyes:  Negative for redness.  Respiratory: Negative.  Negative for cough, chest tightness, shortness of breath and wheezing.   Cardiovascular: Negative.  Negative for chest pain and palpitations.  Gastrointestinal: Negative.  Negative for abdominal pain, constipation, diarrhea, nausea and vomiting.  Genitourinary:  Negative for dysuria and frequency.  Musculoskeletal:  Positive for arthralgias and back pain. Negative for joint swelling and neck pain.  Skin:  Negative for rash.  Neurological: Negative.  Negative for tremors and numbness.  Hematological:  Negative for adenopathy. Does not bruise/bleed easily.  Psychiatric/Behavioral:  Negative for behavioral problems (Depression), sleep disturbance and suicidal ideas. The patient is not nervous/anxious.     Vital Signs: BP (!) 105/39   Pulse 94   Temp 97.8 F (36.6 C)   Resp 16   Ht  '4\' 10"'$  (1.473 m)   Wt 153 lb (69.4 kg)   SpO2 95%   BMI 31.98 kg/m    Physical Exam Vitals reviewed.  Constitutional:      General: She is not in acute distress.    Appearance: Normal appearance. She is normal weight. She is not ill-appearing.  HENT:     Head: Normocephalic and atraumatic.  Eyes:     Pupils: Pupils are equal, round, and reactive to light.  Cardiovascular:     Rate and Rhythm: Normal rate. Rhythm irregular.  Pulmonary:     Effort: Pulmonary effort is  normal. No respiratory distress.  Neurological:     Mental Status: She is alert and oriented to person, place, and time.     Cranial Nerves: No cranial nerve deficit.     Coordination: Coordination normal.     Gait: Gait (uses cane) normal.  Psychiatric:        Mood and Affect: Mood normal.        Behavior: Behavior normal.        Assessment/Plan: 1. Uncontrolled type 2 diabetes mellitus with hyperglycemia (HCC) A1c increased some, did not have shots over thanksgiving break when visiting family. Will repeat A1c in 3 months.  - POCT glycosylated hemoglobin (Hb A1C)  2. Essential hypertension Stable, continue medications as prescribed.   3. Primary generalized (osteo)arthritis Try muscle relaxant at night, continue as prescribed if it helps the musculoskeletal pain and spasms. - cyclobenzaprine (FLEXERIL) 5 MG tablet; Take 1 tablet (5 mg total) by mouth 3 (three) times daily as needed for muscle spasms.  Dispense: 90 tablet; Refill: 1  4. Medicine refill - traMADol (ULTRAM) 50 MG tablet; Take one tab po bid prn for pain  Dispense: 180 tablet; Refill: 0   General Counseling: Lizza verbalizes understanding of the findings of todays visit and agrees with plan of treatment. I have discussed any further diagnostic evaluation that may be needed or ordered today. We also reviewed her medications today. she has been encouraged to call the office with any questions or concerns that should arise related to todays  visit.    Orders Placed This Encounter  Procedures   POCT glycosylated hemoglobin (Hb A1C)    Meds ordered this encounter  Medications   traMADol (ULTRAM) 50 MG tablet    Sig: Take one tab po bid prn for pain    Dispense:  180 tablet    Refill:  0    Please fill as 90 day supply   cyclobenzaprine (FLEXERIL) 5 MG tablet    Sig: Take 1 tablet (5 mg total) by mouth 3 (three) times daily as needed for muscle spasms.    Dispense:  90 tablet    Refill:  1    Return in about 3 months (around 12/21/2022) for F/U, Recheck A1C, Emberlin Verner PCP.   Total time spent:30 Minutes Time spent includes review of chart, medications, test results, and follow up plan with the patient.   [place] Controlled Substance Database was reviewed by me.  This patient was seen by Jonetta Osgood, FNP-C in collaboration with Dr. Clayborn Bigness as a part of collaborative care agreement.   Linzy Darling R. Valetta Fuller, MSN, FNP-C Internal medicine

## 2022-09-22 ENCOUNTER — Encounter: Payer: Self-pay | Admitting: Nurse Practitioner

## 2022-09-25 ENCOUNTER — Other Ambulatory Visit: Payer: Self-pay | Admitting: Nurse Practitioner

## 2022-09-25 DIAGNOSIS — I4891 Unspecified atrial fibrillation: Secondary | ICD-10-CM

## 2022-10-09 ENCOUNTER — Ambulatory Visit (INDEPENDENT_AMBULATORY_CARE_PROVIDER_SITE_OTHER): Payer: Medicare HMO

## 2022-10-09 DIAGNOSIS — I4891 Unspecified atrial fibrillation: Secondary | ICD-10-CM

## 2022-10-09 LAB — PROTIME-INR

## 2022-10-09 NOTE — Progress Notes (Signed)
Pt INR 2.0 as per alyssa no change continue same med MON ,WED AND SAT coumadin 4 mg and then take coumadin 2 mg rest of the week

## 2022-10-13 ENCOUNTER — Other Ambulatory Visit: Payer: Self-pay | Admitting: Nurse Practitioner

## 2022-10-13 DIAGNOSIS — I4891 Unspecified atrial fibrillation: Secondary | ICD-10-CM

## 2022-10-13 DIAGNOSIS — Z760 Encounter for issue of repeat prescription: Secondary | ICD-10-CM

## 2022-10-19 ENCOUNTER — Emergency Department: Payer: Medicare HMO

## 2022-10-19 DIAGNOSIS — J439 Emphysema, unspecified: Secondary | ICD-10-CM | POA: Diagnosis not present

## 2022-10-19 DIAGNOSIS — I509 Heart failure, unspecified: Secondary | ICD-10-CM | POA: Diagnosis not present

## 2022-10-19 DIAGNOSIS — Z20822 Contact with and (suspected) exposure to covid-19: Secondary | ICD-10-CM | POA: Insufficient documentation

## 2022-10-19 DIAGNOSIS — R1900 Intra-abdominal and pelvic swelling, mass and lump, unspecified site: Secondary | ICD-10-CM | POA: Diagnosis not present

## 2022-10-19 DIAGNOSIS — R601 Generalized edema: Secondary | ICD-10-CM | POA: Diagnosis not present

## 2022-10-19 DIAGNOSIS — J449 Chronic obstructive pulmonary disease, unspecified: Secondary | ICD-10-CM | POA: Insufficient documentation

## 2022-10-19 DIAGNOSIS — Z7901 Long term (current) use of anticoagulants: Secondary | ICD-10-CM | POA: Insufficient documentation

## 2022-10-19 DIAGNOSIS — R0602 Shortness of breath: Secondary | ICD-10-CM | POA: Diagnosis not present

## 2022-10-19 DIAGNOSIS — I4891 Unspecified atrial fibrillation: Secondary | ICD-10-CM | POA: Diagnosis not present

## 2022-10-19 DIAGNOSIS — I110 Hypertensive heart disease with heart failure: Secondary | ICD-10-CM | POA: Diagnosis not present

## 2022-10-19 LAB — RESP PANEL BY RT-PCR (RSV, FLU A&B, COVID)  RVPGX2
Influenza A by PCR: NEGATIVE
Influenza B by PCR: NEGATIVE
Resp Syncytial Virus by PCR: NEGATIVE
SARS Coronavirus 2 by RT PCR: NEGATIVE

## 2022-10-19 LAB — BRAIN NATRIURETIC PEPTIDE: B Natriuretic Peptide: 169.6 pg/mL — ABNORMAL HIGH (ref 0.0–100.0)

## 2022-10-19 LAB — CBC
HCT: 37.4 % (ref 36.0–46.0)
Hemoglobin: 12 g/dL (ref 12.0–15.0)
MCH: 30.2 pg (ref 26.0–34.0)
MCHC: 32.1 g/dL (ref 30.0–36.0)
MCV: 94.2 fL (ref 80.0–100.0)
Platelets: 200 10*3/uL (ref 150–400)
RBC: 3.97 MIL/uL (ref 3.87–5.11)
RDW: 12.7 % (ref 11.5–15.5)
WBC: 8.2 10*3/uL (ref 4.0–10.5)
nRBC: 0 % (ref 0.0–0.2)

## 2022-10-19 LAB — TROPONIN I (HIGH SENSITIVITY): Troponin I (High Sensitivity): 6 ng/L (ref <18)

## 2022-10-19 LAB — COMPREHENSIVE METABOLIC PANEL
ALT: 10 U/L (ref 0–44)
AST: 20 U/L (ref 15–41)
Albumin: 3.4 g/dL — ABNORMAL LOW (ref 3.5–5.0)
Alkaline Phosphatase: 90 U/L (ref 38–126)
Anion gap: 10 (ref 5–15)
BUN: 37 mg/dL — ABNORMAL HIGH (ref 8–23)
CO2: 24 mmol/L (ref 22–32)
Calcium: 8.7 mg/dL — ABNORMAL LOW (ref 8.9–10.3)
Chloride: 100 mmol/L (ref 98–111)
Creatinine, Ser: 2.37 mg/dL — ABNORMAL HIGH (ref 0.44–1.00)
GFR, Estimated: 20 mL/min — ABNORMAL LOW (ref >60)
Glucose, Bld: 388 mg/dL — ABNORMAL HIGH (ref 70–99)
Potassium: 3.7 mmol/L (ref 3.5–5.1)
Sodium: 134 mmol/L — ABNORMAL LOW (ref 135–145)
Total Bilirubin: 0.5 mg/dL (ref 0.3–1.2)
Total Protein: 7.6 g/dL (ref 6.5–8.1)

## 2022-10-19 LAB — D-DIMER, QUANTITATIVE: D-Dimer, Quant: 0.33 ug/mL-FEU (ref 0.00–0.50)

## 2022-10-19 NOTE — ED Triage Notes (Signed)
Pt reports SOB for 3 days with non-productive cough. Daughter reports history of COPD and CHF, pt reports fills her abd is full of fluids 8/10 abd pain, pt takes torsemide daily. Pt denies CP, denies fever or chills, [facility]. Afebrile at current. Former smoker, quit 2 months ago. Pt has no difficulty completing sentences, GCS 15.

## 2022-10-20 ENCOUNTER — Emergency Department
Admission: EM | Admit: 2022-10-20 | Discharge: 2022-10-20 | Disposition: A | Discharge status: Home / Self Care | Payer: Medicare HMO | Attending: Emergency Medicine | Admitting: Emergency Medicine

## 2022-10-20 DIAGNOSIS — I509 Heart failure, unspecified: Secondary | ICD-10-CM

## 2022-10-20 DIAGNOSIS — R601 Generalized edema: Secondary | ICD-10-CM

## 2022-10-20 DIAGNOSIS — I482 Chronic atrial fibrillation, unspecified: Secondary | ICD-10-CM

## 2022-10-20 HISTORY — DX: Chronic obstructive pulmonary disease, unspecified: J44.9

## 2022-10-20 LAB — TROPONIN I (HIGH SENSITIVITY): Troponin I (High Sensitivity): 6 ng/L (ref <18)

## 2022-10-20 NOTE — Discharge Instructions (Signed)
We talked about admitting you to the hospital for the fluid you are retaining, but you very much do not want to be in the hospital, and at this point you do not meet criteria for inpatient treatment.  Please take an extra fluid pill as we discussed, and follow-up with your regular doctor first thing in the morning to see if they can see you in clinic.  Return to the emergency department if you develop new or worsening symptoms that concern you.

## 2022-10-20 NOTE — ED Notes (Signed)
Pt arrived to hall 11 from [place] lobby at this time with daughter, pt alert NAD noted.

## 2022-10-20 NOTE — ED Provider Notes (Signed)
[facility] Provider Note    Event Date/Time   First MD Initiated Contact with Patient 10/20/22 [phone]     (approximate)   History   Shortness of Breath   HPI  [name] is a 81 y.o. female who presents with her daughter for evaluation of swelling.  She said that every time she starts to retain more fluid, it seems to mostly show up as swelling around her abdomen.  She has had a little bit of a nonproductive cough as well but does not feel short of breath.  She has a history of both COPD and CHF.  She was treated in the past with furosemide but since then her primary care doctor has switched her to torsemide which seems to work better for her.  Sometimes she has to take extra doses to get some extra fluid off.  She has been gradually getting worse over the last few days.  She denies chest pain, fever, sore throat, nausea, vomiting, and abdominal pain.  She states that she has had no numbness or weakness in her extremities.  Her daughter says that she gave her an extra dose of torsemide tonight.  They have been to the heart failure clinic in the past but was told that there was nothing in particular they could do for her, so she has her heart failure managed by her PCP.  Of note, she is aware of her diagnosis of atrial fibrillation and takes Eliquis.     Physical Exam   Triage Vital Signs: ED Triage Vitals  Enc Vitals Group     BP 10/19/22 2111 116/68     Pulse Rate 10/19/22 2111 (!) 127     Resp 10/19/22 2111 17     Temp 10/19/22 2111 98.1 F (36.7 C)     Temp Source 10/19/22 2111 Oral     SpO2 10/19/22 2111 98 %     Weight 10/19/22 2118 69.4 kg (153 lb)     Height 10/19/22 2118 1.473 m ('4\' 10"'$ )     Head Circumference --      Peak Flow --      Pain Score 10/19/22 2117 8     Pain Loc --      Pain Edu? --      Excl. in [place]? --     Most recent vital signs: Vitals:   10/20/22 0321 10/20/22 0444  BP: (!) 122/58 (!) 164/61  Pulse: 98 89  Resp: 16 17   Temp: 97.7 F (36.5 C) 97.9 F (36.6 C)  SpO2: 100% 97%     General: Awake, no distress.  Awake and alert x 3. CV:  Good peripheral perfusion.  Irregularly irregular rhythm, normal rate. Resp:  Normal effort.  Lungs are clear to auscultation, no wheezing. Abd:  Mild generalized tenderness, no specific area of maximal tenderness.  See below for details of anasarca.  She has an old and well-healed surgical scar from a gallbladder surgery many years ago. Other:  No focal neurological deficits, moving all 4 extremities without difficulty.  The patient has no peripheral edema which she says she never gets.  However she does have some generalized anasarca around her torso and abdomen that feels tense but still with easily palpable compartments.   ED Results / Procedures / Treatments   Labs (all labs ordered are listed, but only abnormal results are displayed) Labs Reviewed  COMPREHENSIVE METABOLIC PANEL - Abnormal; Notable for the following components:  Result Value   Sodium 134 (*)    Glucose, Bld 388 (*)    BUN 37 (*)    Creatinine, Ser 2.37 (*)    Calcium 8.7 (*)    Albumin 3.4 (*)    GFR, Estimated 20 (*)    All other components within normal limits  BRAIN NATRIURETIC PEPTIDE - Abnormal; Notable for the following components:   B Natriuretic Peptide 169.6 (*)    All other components within normal limits  RESP PANEL BY RT-PCR (RSV, FLU A&B, COVID)  RVPGX2  CBC  D-DIMER, QUANTITATIVE  TROPONIN I (HIGH SENSITIVITY)  TROPONIN I (HIGH SENSITIVITY)     EKG  ED ECG REPORT I, Hinda Kehr, the attending physician, personally viewed and interpreted this ECG.  Date: 10/19/2022 EKG Time: 21: 23 Rate: 101 RhythmQRS Axis: normal Intervals: normal ST/T Wave abnormalities: Non-specific ST segment / T-wave changes, but no clear evidence of acute ischemia. Narrative Interpretation: no definitive evidence of acute ischemia; does not meet STEMI criteria.    RADIOLOGY I viewed  and interpreted the patient's 1 view chest x-ray.  She has chronic cardiomegaly but no evidence of acute airway disease.  The radiology report also did not identify a specific acute issue.    PROCEDURES:  Critical Care performed: No  Procedures   MEDICATIONS ORDERED IN ED: Medications - No data to display   IMPRESSION / MDM / [place] / ED COURSE  I reviewed the triage vital signs and the nursing notes.                              Differential diagnosis includes, but is not limited to, volume overload, COPD exacerbation, CHF exacerbation, pneumonia, pneumothorax.  Patient's presentation is most consistent with acute presentation with potential threat to life or bodily function.  Lab/studies ordered: EKG, 1 view chest x-ray, CMP, BNP, high-sensitivity troponin x 2, respiratory viral panel, D-dimer, CBC.  Labs are all essentially normal other than a very slightly elevated BNP (at 169.6, much lower than it has been in the past) and a CMP notable for creatinine of 2.37 which is right about her baseline.  The patient is on the cardiac monitor to evaluate for evidence of arrhythmia and/or significant heart rate changes.    (Note that documentation was delayed due to multiple ED patients requiring immediate care.)  Evaluation was reassuring, no specific abnormalities identified.  Labs are at baseline.  I considered hospitalization given her age and her anasarca and report of swelling in her abdomen, but she has no significant tenderness.  I talked with her about staying in the hospital and she adamantly declined and says she would rather go home.  Her daughter assured me she can get a follow-up appointment very shortly with her PCP.  I gave strict return precautions and encouraged them to continue doing an extra daily dose of torsemide, assuming she can be seen in a short period of time by her regular doctor.      FINAL CLINICAL IMPRESSION(S) / ED DIAGNOSES   Final  diagnoses:  Anasarca  Acute on chronic congestive heart failure, unspecified heart failure type ([place])  Chronic atrial fibrillation ([place])     Rx / DC Orders   ED Discharge Orders     None        Note:  This document was prepared using Dragon voice recognition software and may include unintentional dictation errors.   Hinda Kehr, MD 10/20/22  0837  

## 2022-10-24 ENCOUNTER — Other Ambulatory Visit: Payer: Self-pay | Admitting: Nurse Practitioner

## 2022-10-24 DIAGNOSIS — M62838 Other muscle spasm: Secondary | ICD-10-CM

## 2022-10-24 DIAGNOSIS — S76912S Strain of unspecified muscles, fascia and tendons at thigh level, left thigh, sequela: Secondary | ICD-10-CM

## 2022-10-24 NOTE — Telephone Encounter (Signed)
No longer on this med please D/c

## 2022-10-26 ENCOUNTER — Telehealth: Payer: Self-pay | Admitting: Internal Medicine

## 2022-10-26 NOTE — Telephone Encounter (Signed)
Lvm to schedule ED follow up-Toni 

## 2022-10-31 ENCOUNTER — Ambulatory Visit (INDEPENDENT_AMBULATORY_CARE_PROVIDER_SITE_OTHER): Payer: Medicare HMO

## 2022-10-31 DIAGNOSIS — I4891 Unspecified atrial fibrillation: Secondary | ICD-10-CM | POA: Diagnosis not present

## 2022-10-31 LAB — PROTIME-INR

## 2022-10-31 NOTE — Progress Notes (Signed)
Patient INR was 2.0 per Alyssa no change continue same med MON, WED AND SAT coumadin '4mg'$  and then take coumadin '2mg'$  rest of the week.

## 2022-11-02 ENCOUNTER — Telehealth: Payer: Self-pay

## 2022-11-02 ENCOUNTER — Encounter: Payer: Self-pay | Admitting: Nurse Practitioner

## 2022-11-02 ENCOUNTER — Telehealth: Payer: Self-pay | Admitting: Nurse Practitioner

## 2022-11-02 ENCOUNTER — Ambulatory Visit (INDEPENDENT_AMBULATORY_CARE_PROVIDER_SITE_OTHER): Payer: Medicare HMO | Admitting: Nurse Practitioner

## 2022-11-02 ENCOUNTER — Ambulatory Visit
Admission: RE | Admit: 2022-11-02 | Discharge: 2022-11-02 | Disposition: A | Discharge status: Home / Self Care | Payer: Medicare HMO | Source: Ambulatory Visit | Attending: Nurse Practitioner | Admitting: Nurse Practitioner

## 2022-11-02 ENCOUNTER — Other Ambulatory Visit: Payer: Self-pay | Admitting: Nurse Practitioner

## 2022-11-02 VITALS — BP 119/54 | HR 110 | Temp 97.0°F | Resp 16 | Ht <= 58 in | Wt 155.4 lb

## 2022-11-02 DIAGNOSIS — R109 Unspecified abdominal pain: Secondary | ICD-10-CM | POA: Diagnosis not present

## 2022-11-02 DIAGNOSIS — R1011 Right upper quadrant pain: Secondary | ICD-10-CM

## 2022-11-02 DIAGNOSIS — R110 Nausea: Secondary | ICD-10-CM | POA: Diagnosis not present

## 2022-11-02 DIAGNOSIS — K5901 Slow transit constipation: Secondary | ICD-10-CM

## 2022-11-02 DIAGNOSIS — I700 Atherosclerosis of aorta: Secondary | ICD-10-CM | POA: Diagnosis not present

## 2022-11-02 DIAGNOSIS — R1901 Right upper quadrant abdominal swelling, mass and lump: Secondary | ICD-10-CM

## 2022-11-02 DIAGNOSIS — M150 Primary generalized (osteo)arthritis: Secondary | ICD-10-CM | POA: Diagnosis not present

## 2022-11-02 DIAGNOSIS — G3184 Mild cognitive impairment, so stated: Secondary | ICD-10-CM

## 2022-11-02 MED ORDER — METHOCARBAMOL 500 MG PO TABS: 500.0000 mg | ORAL_TABLET | Freq: Four times a day (QID) | ORAL | 0 refills | Status: DC

## 2022-11-02 MED ORDER — ONDANSETRON 4 MG PO TBDP: 4.0000 mg | ORAL_TABLET | Freq: Four times a day (QID) | ORAL | 0 refills | Status: DC | PRN

## 2022-11-02 MED ORDER — CYCLOBENZAPRINE HCL 5 MG PO TABS: 5.0000 mg | ORAL_TABLET | Freq: Every day | ORAL | 1 refills | Status: DC

## 2022-11-02 MED ORDER — CYCLOBENZAPRINE HCL 5 MG PO TABS: 5.0000 mg | ORAL_TABLET | Freq: Three times a day (TID) | ORAL | 1 refills | Status: DC | PRN

## 2022-11-02 MED ORDER — METHOCARBAMOL 500 MG PO TABS: 500.0000 mg | ORAL_TABLET | Freq: Two times a day (BID) | ORAL | 0 refills | Status: DC

## 2022-11-02 MED ORDER — TRAMADOL HCL 50 MG PO TABS: ORAL_TABLET | ORAL | 0 refills | Status: DC

## 2022-11-02 NOTE — Telephone Encounter (Signed)
Notified June's husband of CT appointment time and location-Toni

## 2022-11-02 NOTE — Telephone Encounter (Signed)
Phar called for direction and we change cyclobenzaprine take 1 tab po bedtime and robaxin twice a daily

## 2022-11-02 NOTE — Progress Notes (Signed)
[facility] [place], [place] [address]  Internal MEDICINE  Office Visit Note  Patient Name: [name]  [id number]  [id number]  Date of Service: 11/02/2022  Chief Complaint  Patient presents with   Follow-up    Ed f/u    HPI [name] presents for a follow-up visit for ED visit and tender and swollen abdomen ED visit for edema, ED did nothing, daughter gave patient extra fluid pill and it improved and changed eating habits, decreased pasta.  RUQ swollen and tender, increased pain over last 3 weeks and further worsening over past 24 hours, with nausea. Lifting right leg while standing causes further right sided abdominal pain    Current Medication: Outpatient Encounter Medications as of 11/02/2022  Medication Sig   amLODipine (NORVASC) 5 MG tablet Take 1 tablet (5 mg total) by mouth daily.   atorvastatin (LIPITOR) 10 MG tablet Take 1 tablet (10 mg total) by mouth every evening.   citalopram (CELEXA) 20 MG tablet Take 1 tablet (20 mg total) by mouth daily.   donepezil (ARICEPT) 5 MG tablet Take 1 tablet (5 mg total) by mouth in the morning.   ferrous sulfate 325 (65 FE) MG tablet Take 1 tablet (325 mg total) by mouth daily with breakfast.   insulin detemir (LEVEMIR FLEXTOUCH) 100 UNIT/ML FlexPen Inject 10 Units into the skin daily.   Insulin Pen Needle (PEN NEEDLES 3/16") 31G X 5 MM MISC 1 applicator by Does not apply route 4 (four) times daily.   linaclotide (LINZESS) 145 MCG CAPS capsule Take 1 capsule (145 mcg total) by mouth at bedtime.   linagliptin (TRADJENTA) 5 MG TABS tablet Take 1 tablet (5 mg total) by mouth daily.   melatonin 3 MG TABS tablet Take 3 mg by mouth at bedtime as needed (sleep).   methocarbamol (ROBAXIN) 500 MG tablet Take 1 tablet (500 mg total) by mouth 4 (four) times daily.   montelukast (SINGULAIR) 10 MG tablet TAKE 1 TABLET BY MOUTH AT BEDTIME   pantoprazole (PROTONIX) 40 MG tablet Take 1 tablet by mouth twice daily   rOPINIRole  (REQUIP) 0.5 MG tablet Take 1 tablet (0.5 mg total) by mouth every evening.   torsemide (DEMADEX) 20 MG tablet Take 2 tablets by mouth once daily   vitamin B-12 1000 MCG tablet Take 1 tablet (1,000 mcg total) by mouth daily.   warfarin (COUMADIN) 2 MG tablet TAKE 1 TABLET BY MOUTH ONCE DAILY EXCEPT ON WEDNESDAYS AND SATURDAYS TAKE WARFARIN '4MG'$    warfarin (COUMADIN) 4 MG tablet TAKE 1 TABLET BY MOUTH ON MONDAY, WEDNESDAY AND FRIDAY   [DISCONTINUED] cyclobenzaprine (FLEXERIL) 5 MG tablet Take 1 tablet (5 mg total) by mouth 3 (three) times daily as needed for muscle spasms.   [DISCONTINUED] ondansetron (ZOFRAN-ODT) 4 MG disintegrating tablet Take 1 tablet (4 mg total) by mouth every 6 (six) hours as needed for nausea or vomiting.   [DISCONTINUED] traMADol (ULTRAM) 50 MG tablet Take one tab po bid prn for pain   cyclobenzaprine (FLEXERIL) 5 MG tablet Take 1 tablet (5 mg total) by mouth 3 (three) times daily as needed for muscle spasms.   ondansetron (ZOFRAN-ODT) 4 MG disintegrating tablet Take 1 tablet (4 mg total) by mouth every 6 (six) hours as needed for nausea or vomiting.   traMADol (ULTRAM) 50 MG tablet Take one tab po bid prn for pain   No facility-administered encounter medications on file as of 11/02/2022.    Surgical History: Past Surgical History:  Procedure Laterality  Date   ABDOMINAL HYSTERECTOMY     APPENDECTOMY     CATARACT EXTRACTION     CHOLECYSTECTOMY     HERNIA REPAIR     right knee replacement     right nephroectomy      Medical History: Past Medical History:  Diagnosis Date   Anemia    Atrial fibrillation (HCC)    Chronic kidney disease    Congestive heart failure (CHF) (HCC)    COPD (chronic obstructive pulmonary disease) (HCC)    Diabetes mellitus without complication (HCC)    GERD (gastroesophageal reflux disease)    Hyperlipidemia    Hypertension    Pneumonia    Pulmonary fibrosis ([place])     Family History: Family History  Problem Relation Age of Onset    Diabetes Mother    Breast cancer Mother    Heart disease Father    Diabetes Father    Diabetes Sister    Diabetes Brother    Cancer Brother     Social History   Socioeconomic History   Marital status: Widowed    Spouse name: Not on file   Number of children: Not on file   Years of education: Not on file   Highest education level: Not on file  Occupational History   Occupation: retired  Tobacco Use   Smoking status: Former    Packs/day: 1.00    Types: Cigarettes    Quit date: 08/19/2022    Years since quitting: 0.2   Smokeless tobacco: Never   Tobacco comments:    1 pack daily---quit 1 month   Vaping Use   Vaping Use: Never used  Substance and Sexual Activity   Alcohol use: No   Drug use: No   Sexual activity: Not Currently  Other Topics Concern   Not on file  Social History Narrative   Lives with daughter   Social Determinants of Health   Financial Resource Strain: Not on file  Food Insecurity: Not on file  Transportation Needs: Not on file  Physical Activity: Not on file  Stress: Not on file  Social Connections: Not on file  Intimate Partner Violence: Not on file      Review of Systems  Constitutional:  Positive for fatigue. Negative for appetite change, chills and unexpected weight change.  HENT: Negative.  Negative for congestion, rhinorrhea, sneezing and sore throat.   Eyes:  Negative for redness.  Respiratory: Negative.  Negative for cough, chest tightness, shortness of breath and wheezing.   Cardiovascular: Negative.  Negative for chest pain and palpitations.  Gastrointestinal:  Positive for abdominal distention, abdominal pain and nausea. Negative for constipation, diarrhea and vomiting.  Genitourinary:  Negative for dysuria and frequency.  Musculoskeletal:  Positive for arthralgias and back pain. Negative for joint swelling and neck pain.  Skin:  Negative for rash.  Neurological: Negative.  Negative for tremors and numbness.  Hematological:   Negative for adenopathy. Does not bruise/bleed easily.  Psychiatric/Behavioral:  Negative for behavioral problems (Depression), sleep disturbance and suicidal ideas. The patient is not nervous/anxious.     Vital Signs: BP (!) 119/54   Pulse (!) 110   Temp (!) 97 F (36.1 C)   Resp 16   Ht '4\' 10"'$  (1.473 m)   Wt 155 lb 6.4 oz (70.5 kg)   SpO2 95%   BMI 32.48 kg/m    Physical Exam Vitals reviewed.  Constitutional:      General: She is not in acute distress.    Appearance: Normal appearance.  She is obese. She is ill-appearing.  HENT:     Head: Normocephalic and atraumatic.  Eyes:     Pupils: Pupils are equal, round, and reactive to light.  Cardiovascular:     Rate and Rhythm: Normal rate and regular rhythm.  Pulmonary:     Effort: Pulmonary effort is normal. No respiratory distress.  Abdominal:     General: Bowel sounds are normal. There is distension.     Palpations: There is hepatomegaly.     Tenderness: There is abdominal tenderness in the right upper quadrant.     Hernia: A hernia is present.  Neurological:     Mental Status: She is alert and oriented to person, place, and time.  Psychiatric:        Mood and Affect: Mood normal.        Behavior: Behavior normal.       Assessment/Plan: 1. Abdominal pain, RUQ Stat CT scan of abdomen for swollen painful area of RUQ of abdomen - CT ABDOMEN PELVIS WO CONTRAST; Future  2. Right upper quadrant abdominal swelling See problem #1 - CT ABDOMEN PELVIS WO CONTRAST; Future  3. Primary generalized (osteo)arthritis Has chronic joint pains, takes prn methocarbamol during the daytime and may take cyclobenzaprine at bedtime as needed.  - cyclobenzaprine (FLEXERIL) 5 MG tablet; Take 1 tablet (5 mg total) by mouth prn at bedtime as needed for muscle spasms.  Dispense: 90 tablet; Refill: 1 - traMADol (ULTRAM) 50 MG tablet; Take one tab po bid prn for pain  Dispense: 180 tablet; Refill: 0 - methocarbamol (ROBAXIN) 500 MG tablet;  Take 1 tablet (500 mg total) by mouth 2 times daily.  Dispense: 90 tablet; Refill: 0  4. Nausea without vomiting Continue ondansetron prn as prescribed for nausea - ondansetron (ZOFRAN-ODT) 4 MG disintegrating tablet; Take 1 tablet (4 mg total) by mouth every 6 (six) hours as needed for nausea or vomiting.  Dispense: 20 tablet; Refill: 0   General Counseling: Kalynne verbalizes understanding of the findings of todays visit and agrees with plan of treatment. I have discussed any further diagnostic evaluation that may be needed or ordered today. We also reviewed her medications today. she has been encouraged to call the office with any questions or concerns that should arise related to todays visit.    Orders Placed This Encounter  Procedures   CT ABDOMEN PELVIS WO CONTRAST    Meds ordered this encounter  Medications   cyclobenzaprine (FLEXERIL) 5 MG tablet    Sig: Take 1 tablet (5 mg total) by mouth 3 (three) times daily as needed for muscle spasms.    Dispense:  90 tablet    Refill:  1   ondansetron (ZOFRAN-ODT) 4 MG disintegrating tablet    Sig: Take 1 tablet (4 mg total) by mouth every 6 (six) hours as needed for nausea or vomiting.    Dispense:  20 tablet    Refill:  0   traMADol (ULTRAM) 50 MG tablet    Sig: Take one tab po bid prn for pain    Dispense:  180 tablet    Refill:  0    Please fill as 90 day supply   methocarbamol (ROBAXIN) 500 MG tablet    Sig: Take 1 tablet (500 mg total) by mouth 4 (four) times daily.    Dispense:  90 tablet    Refill:  0    Return for previously scheduled, Nesta Kimple PCP in march.   Total time spent:30 Minutes Time spent includes review of  chart, medications, test results, and follow up plan with the patient.   [place] Controlled Substance Database was reviewed by me.  This patient was seen by Jonetta Osgood, FNP-C in collaboration with Dr. Clayborn Bigness as a part of collaborative care agreement.   Daylen Lipsky R. Valetta Fuller, MSN, FNP-C Internal  medicine

## 2022-11-03 ENCOUNTER — Encounter: Payer: Self-pay | Admitting: Nurse Practitioner

## 2022-11-07 ENCOUNTER — Telehealth: Payer: Self-pay

## 2022-11-07 DIAGNOSIS — K458 Other specified abdominal hernia without obstruction or gangrene: Secondary | ICD-10-CM

## 2022-11-09 ENCOUNTER — Telehealth: Payer: Self-pay | Admitting: Nurse Practitioner

## 2022-11-09 MED ORDER — OXYCODONE-ACETAMINOPHEN 5-325 MG PO TABS: 1.0000 | ORAL_TABLET | Freq: Three times a day (TID) | ORAL | 0 refills | Status: DC | PRN

## 2022-11-09 NOTE — Telephone Encounter (Signed)
Left vm for patient's daughter to call back for imaging results of CT abd pelvis. Results show a right flank hernia and calcification of the right ureter with dilation which could be causing the pain, she may also have scar tissue and adhesions that could be contributing as well. Discussed next steps with Dr. Clayborn Bigness and the plan is to refer to general surgery for further evaluation. Will discuss this with the patient's daughter when she calls back to the office.

## 2022-11-09 NOTE — Telephone Encounter (Signed)
Discussed results with patient's caregiver, duaghter June, pain medication ordered and referral sent

## 2022-11-13 DIAGNOSIS — Z7901 Long term (current) use of anticoagulants: Secondary | ICD-10-CM | POA: Diagnosis not present

## 2022-11-13 NOTE — Progress Notes (Deleted)
Patient ID: [name], female   DOB: 01-21-1941, 82 y.o.   MRN: [id number]  Chief Complaint:  ***  History of Present Illness [name] is a 82 y.o. female with ***.  Past Medical History Past Medical History:  Diagnosis Date   Anemia    Atrial fibrillation (HCC)    Chronic kidney disease    Congestive heart failure (CHF) (HCC)    COPD (chronic obstructive pulmonary disease) (HCC)    Diabetes mellitus without complication (HCC)    GERD (gastroesophageal reflux disease)    Hyperlipidemia    Hypertension    Pneumonia    Pulmonary fibrosis ([place])       Past Surgical History:  Procedure Laterality Date   ABDOMINAL HYSTERECTOMY     APPENDECTOMY     CATARACT EXTRACTION     CHOLECYSTECTOMY     HERNIA REPAIR     right knee replacement     right nephroectomy      No Known Allergies  Current Outpatient Medications  Medication Sig Dispense Refill   amLODipine (NORVASC) 5 MG tablet Take 1 tablet (5 mg total) by mouth daily. 90 tablet 3   atorvastatin (LIPITOR) 10 MG tablet Take 1 tablet (10 mg total) by mouth every evening. 90 tablet 1   citalopram (CELEXA) 20 MG tablet Take 1 tablet (20 mg total) by mouth daily. 90 tablet 3   cyclobenzaprine (FLEXERIL) 5 MG tablet Take 1 tablet (5 mg total) by mouth at bedtime. 30 tablet 1   donepezil (ARICEPT) 5 MG tablet TAKE 1 TABLET BY MOUTH IN THE MORNING 90 tablet 0   ferrous sulfate 325 (65 FE) MG tablet Take 1 tablet (325 mg total) by mouth daily with breakfast.  3   insulin detemir (LEVEMIR FLEXTOUCH) 100 UNIT/ML FlexPen Inject 10 Units into the skin daily. 15 mL 11   Insulin Pen Needle (PEN NEEDLES 3/16") 31G X 5 MM MISC 1 applicator by Does not apply route 4 (four) times daily. 100 each 1   linagliptin (TRADJENTA) 5 MG TABS tablet Take 1 tablet (5 mg total) by mouth daily. 90 tablet 3   LINZESS 145 MCG CAPS capsule Take 1 capsule by mouth at bedtime 90 capsule 0   melatonin 3 MG TABS tablet Take 3 mg by mouth at bedtime as  needed (sleep).     methocarbamol (ROBAXIN) 500 MG tablet Take 1 tablet (500 mg total) by mouth in the morning and at bedtime. 60 tablet 0   montelukast (SINGULAIR) 10 MG tablet TAKE 1 TABLET BY MOUTH AT BEDTIME 90 tablet 3   ondansetron (ZOFRAN-ODT) 4 MG disintegrating tablet Take 1 tablet (4 mg total) by mouth every 6 (six) hours as needed for nausea or vomiting. 20 tablet 0   oxyCODONE-acetaminophen (PERCOCET/ROXICET) 5-325 MG tablet Take 1 tablet by mouth every 8 (eight) hours as needed for up to 5 days for severe pain. 15 tablet 0   pantoprazole (PROTONIX) 40 MG tablet Take 1 tablet by mouth twice daily 180 tablet 0   rOPINIRole (REQUIP) 0.5 MG tablet Take 1 tablet (0.5 mg total) by mouth every evening. 90 tablet 1   torsemide (DEMADEX) 20 MG tablet Take 2 tablets by mouth once daily 180 tablet 0   traMADol (ULTRAM) 50 MG tablet Take one tab po bid prn for pain 180 tablet 0   vitamin B-12 1000 MCG tablet Take 1 tablet (1,000 mcg total) by mouth daily.     warfarin (COUMADIN) 2 MG tablet TAKE 1 TABLET  BY MOUTH ONCE DAILY EXCEPT ON WEDNESDAYS AND SATURDAYS TAKE WARFARIN 4MG 65 tablet 0   warfarin (COUMADIN) 4 MG tablet TAKE 1 TABLET BY MOUTH ON MONDAY, WEDNESDAY AND FRIDAY 36 tablet 3   No current facility-administered medications for this visit.    Family History Family History  Problem Relation Age of Onset   Diabetes Mother    Breast cancer Mother    Heart disease Father    Diabetes Father    Diabetes Sister    Diabetes Brother    Cancer Brother       Social History Social History   Tobacco Use   Smoking status: Former    Packs/day: 1.00    Types: Cigarettes    Quit date: 08/19/2022    Years since quitting: 0.2   Smokeless tobacco: Never   Tobacco comments:    1 pack daily---quit 1 month   Vaping Use   Vaping Use: Never used  Substance Use Topics   Alcohol use: No   Drug use: No        ROS   Physical Exam There were no vitals taken for this  visit.   CONSTITUTIONAL: Well developed, and nourished, appropriately responsive and aware without distress. ***  EYES: Sclera non-icteric.   EARS, NOSE, MOUTH AND THROAT: Mask worn.  *** The oropharynx is clear. Oral mucosa is pink and moist.  Dentition: ***   Hearing is intact to voice.  NECK: Trachea is midline, and there is no jugular venous distension.  LYMPH NODES:  Lymph nodes in the neck are not appreciated. RESPIRATORY:  Lungs are clear, and breath sounds are equal bilaterally. Normal respiratory effort without pathologic use of accessory muscles. CARDIOVASCULAR: Heart is regular in rate and rhythm.  Well perfused.  GI: The abdomen is *** soft, nontender, and nondistended. There were no palpable masses. I did not appreciate hepatosplenomegaly. There were normal bowel sounds. GU: *** MUSCULOSKELETAL:  Symmetrical muscle tone appreciated in all four extremities.    SKIN: Skin turgor is normal. No pathologic skin lesions appreciated.  NEUROLOGIC:  Motor and sensation appear grossly normal.  Cranial nerves are grossly without defect. PSYCH:  Alert and oriented to person, place and time. Affect is appropriate for situation.  Data Reviewed I have personally reviewed what is currently available of the patient's imaging, recent labs and medical records.   Labs:     Latest Ref Rng & Units 10/19/2022    9:24 PM 12/01/2021    4:43 AM 11/30/2021   10:27 AM  CBC  WBC 4.0 - 10.5 K/uL 8.2  8.5  9.3   Hemoglobin 12.0 - 15.0 g/dL 12.0  10.6  13.0   Hematocrit 36.0 - 46.0 % 37.4  31.2  38.3   Platelets 150 - 400 K/uL 200  169  214       Latest Ref Rng & Units 10/19/2022    9:24 PM 12/01/2021   12:54 PM 12/01/2021    4:43 AM  CMP  Glucose 70 - 99 mg/dL 388  386  196   BUN 8 - 23 mg/dL 37   33   Creatinine 0.44 - 1.00 mg/dL 2.37   2.07   Sodium 135 - 145 mmol/L 134   132   Potassium 3.5 - 5.1 mmol/L 3.7   3.6   Chloride 98 - 111 mmol/L 100   100   CO2 22 - 32 mmol/L 24   25   Calcium 8.9 -  10.3 mg/dL 8.7   8.3  Total Protein 6.5 - 8.1 g/dL 7.6   5.9   Total Bilirubin 0.3 - 1.2 mg/dL 0.5   0.5   Alkaline Phos 38 - 126 U/L 90   80   AST 15 - 41 U/L 20   19   ALT 0 - 44 U/L 10   12    *** {Labs :[id number]}  Imaging: Radiological images reviewed:  CLINICAL DATA:  82 year old female with suspected hernia or peritonitis. Presenting with RIGHT upper quadrant and RIGHT-sided abdominal pain and swelling for 4 weeks.   EXAM: CT ABDOMEN AND PELVIS WITHOUT CONTRAST   TECHNIQUE: Multidetector CT imaging of the abdomen and pelvis was performed following the standard protocol without IV contrast.   RADIATION DOSE REDUCTION: This exam was performed according to the departmental dose-optimization program which includes automated exposure control, adjustment of the mA and/or kV according to patient size and/or use of iterative reconstruction technique.   COMPARISON:  May 24, 2021.   FINDINGS: Lower chest: Basilar atelectasis. Mild subpleural reticulation at the lung bases similar to previous imaging. No signs of edema or effusion at the lung bases. Mitral annular calcifications with signs of cardiomegaly. No chest wall abnormality.   Hepatobiliary: Post cholecystectomy. No gross biliary duct dilation. No hepatic abnormality on noncontrast imaging.   Pancreas: Mild pancreatic atrophy without signs of inflammation or contour abnormality.   Spleen: Normal.   Adrenals/Urinary Tract: Normal adrenal glands. Solitary LEFT kidney without hydronephrosis, ureteral calculus or nephrolithiasis. No perivesical stranding.   Cortical scarring of the LEFT kidney otherwise without contour abnormality.   Tubular structure in the RIGHT retroperitoneum with stable appearance showing extensive coarse calcification in its lower aspect and fluid density tracking towards the RIGHT renal fossa. This is likely dilated RIGHT ureter following remote RIGHT nephrectomy.   Stomach/Bowel: Small  hiatal hernia. Mild distal esophageal thickening, nonspecific. No stranding adjacent to the stomach. Small duodenal diverticula. No stranding adjacent to small bowel or sign of small bowel obstruction. No hernia containing small bowel. Appendix not visible but no secondary signs that would suggest acute appendiceal process.   No acute colonic findings.   Vascular/Lymphatic:   Aortic atherosclerosis. No sign of aneurysm. Smooth contour of the IVC. There is no gastrohepatic or hepatoduodenal ligament lymphadenopathy. No retroperitoneal or mesenteric lymphadenopathy.   No pelvic sidewall lymphadenopathy.   Limited assessment of vascular structures due to lack of intravenous contrast.   Reproductive: Post hysterectomy.   Other: Body wall edema. No ascites. Laxity of the RIGHT abdominal wall with small fat containing RIGHT flank hernia (image 26/2) this area is unchanged. Thinning of rectus musculature associated likely with prior RIGHT nephrectomy also unchanged. No bowel containing hernia or acute process though stranding while likely related to dependent edema remains nonspecific. No pneumoperitoneum.   Musculoskeletal: Spinal degenerative changes. Osteopenia. No acute or destructive bone process.   IMPRESSION: 1. No acute findings in the abdomen or the pelvis. 2. Post RIGHT nephrectomy with stable dilation of the RIGHT ureter which shows dense calcification. Perhaps related to dystrophic calcification showing no change since 2019. It is unclear whether this could be a cause of RIGHT flank pain. If there is a history of chronic RIGHT-sided flank pain would consider this possibility. There is no stranding adjacent to this structure in the RIGHT retroperitoneum. 3. No LEFT-sided ureteral calculi or acute process related to the urinary tract. 4. Small hiatal hernia. Mild distal esophageal thickening, nonspecific. Mildly indistinct appearance of the is esophagus at  the gastroesophageal junction. Dedicated esophageal evaluation  may be helpful for further assessment. Findings could reflect esophagitis but remain nonspecific. 5. Aortic atherosclerosis.   Aortic Atherosclerosis (ICD10-I70.0).     Electronically Signed   By: Zetta Bills M.D.   On: 11/02/2022 15:11   Within last 24 hrs: No results found.  Assessment    *** Patient Active Problem List   Diagnosis Date Noted   Closed fracture of left upper limb    Fall 11/30/2021   Traumatic closed fracture of surgical neck of left humerus with minimal displacement 11/30/2021   Ingrown nail of great toe of left foot 08/18/2021   Acute on chronic diastolic congestive heart failure ([place]) 05/24/2021   Type II diabetes mellitus with renal manifestations ([place]) 05/24/2021   HLD (hyperlipidemia) 05/24/2021   Chest pain 05/24/2021   Depression 05/24/2021   Legally blind 05/24/2021   Abdominal pain 05/24/2021   Chronic respiratory failure with hypoxia ([place]) 03/16/2021   SOB (shortness of breath) 03/15/2021   Benign hypertensive kidney disease with chronic kidney disease 03/14/2021   Chronic kidney disease, stage IV (severe) ([place]) 03/14/2021   Acute renal failure superimposed on stage 4 chronic kidney disease ([place]) 02/08/2021   Anemia associated with stage 4 chronic renal failure ([place]) 02/08/2021   Anemia due to stage 5 chronic kidney disease ([place]) 02/07/2021   Acute CHF (congestive heart failure) ([place]) 02/06/2021   Acute on chronic diastolic CHF (congestive heart failure) ([place]) 02/06/2021   Hyperkalemia 02/06/2021   Weakness 12/24/2020   Hypertensive urgency 12/23/2020   Nicotine dependence 12/23/2020   History of anemia due to chronic kidney disease 12/23/2020   Pain due to onychomycosis of toenails of both feet 02/12/2020   Uncontrolled type 2 diabetes mellitus with hyperglycemia ([place]) 05/20/2019   Acute recurrent pansinusitis 05/11/2019   Acute otitis media 05/11/2019   Acute pain of left  shoulder 05/11/2019   Long term (current) use of anticoagulants 04/03/2019   Chronic left shoulder pain 03/18/2019   Sepsis ([place]) 01/12/2018   Colitis 01/12/2018   CKD (chronic kidney disease), stage III ([place]) 01/12/2018   Diabetes ([place]) 12/28/2017   AF (paroxysmal atrial fibrillation) ([place]) 12/28/2017   Encounter for general adult medical examination with abnormal findings 12/28/2017   Primary generalized (osteo)arthritis 12/28/2017   Hypertension 12/27/2017   GERD (gastroesophageal reflux disease) 12/27/2017   Hematuria 03/18/2015   Chronic combined systolic and diastolic heart failure ([place]) 03/01/2012   Obesity 03/01/2012   CKD stage 4 due to type 2 diabetes mellitus ([place]) 03/01/2012   Other benign neoplasm of connective and other soft tissue of lower limb, including hip 03/15/2011   Neoplasm of connective tissue 02/01/2011   Pain in joint, pelvic region and thigh 02/01/2011   Type 2 diabetes mellitus with stage 5 chronic kidney disease ([place]) 10/03/2006   Encounter for current long-term use of anticoagulants 02/07/2006   Essential hypertension 09/24/2004   Atrial fibrillation with RVR ([place]) 06/07/2004    Plan    ***  Face-to-face time spent with the patient and accompanying care providers(if present) was *** minutes, with more than 50% of the time spent counseling, educating, and coordinating care of the patient.    These notes generated with voice recognition software. I apologize for typographical errors.  Ronny Bacon M.D., FACS 11/13/2022, 2:20 PM

## 2022-11-14 ENCOUNTER — Ambulatory Visit (INDEPENDENT_AMBULATORY_CARE_PROVIDER_SITE_OTHER): Payer: Medicare HMO

## 2022-11-14 ENCOUNTER — Ambulatory Visit: Payer: Medicare HMO | Admitting: Surgery

## 2022-11-14 DIAGNOSIS — I4891 Unspecified atrial fibrillation: Secondary | ICD-10-CM | POA: Diagnosis not present

## 2022-11-14 LAB — PROTIME-INR

## 2022-11-14 NOTE — Progress Notes (Signed)
PT INR 2.0 as per alyssa continue same med coumadin '4mg'$  Mon,WED and Sat  and coumadin 2 mg rest of the week

## 2022-11-15 ENCOUNTER — Telehealth: Payer: Self-pay

## 2022-11-15 NOTE — Progress Notes (Signed)
Patient ID: [name], female   DOB: [birth year], 82 y.o.   MRN: [id number]  Chief Complaint: Concerns regarding hernia/right upper quadrant pain.  History of Present Illness [name] is a 82 y.o. female with with a prior history of a right-sided nephrectomy, prior ventral/incisional hernia repairs.  Recent CT of abdomen.  Chronic abdominal wall pain/costal discomfort/pain.  Past Medical History Past Medical History:  Diagnosis Date   Anemia    Atrial fibrillation (HCC)    Chronic kidney disease    Congestive heart failure (CHF) (HCC)    COPD (chronic obstructive pulmonary disease) (HCC)    Diabetes mellitus without complication (HCC)    GERD (gastroesophageal reflux disease)    Hyperlipidemia    Hypertension    Pneumonia    Pulmonary fibrosis ([place])       Past Surgical History:  Procedure Laterality Date   ABDOMINAL HYSTERECTOMY     APPENDECTOMY     CATARACT EXTRACTION     CHOLECYSTECTOMY     HERNIA REPAIR     right knee replacement     right nephroectomy      No Known Allergies  Current Outpatient Medications  Medication Sig Dispense Refill   amLODipine (NORVASC) 5 MG tablet Take 1 tablet (5 mg total) by mouth daily. 90 tablet 3   atorvastatin (LIPITOR) 10 MG tablet Take 1 tablet (10 mg total) by mouth every evening. 90 tablet 1   citalopram (CELEXA) 20 MG tablet Take 1 tablet (20 mg total) by mouth daily. 90 tablet 3   cyclobenzaprine (FLEXERIL) 5 MG tablet Take 1 tablet (5 mg total) by mouth at bedtime. 30 tablet 1   donepezil (ARICEPT) 5 MG tablet TAKE 1 TABLET BY MOUTH IN THE MORNING 90 tablet 0   ferrous sulfate 325 (65 FE) MG tablet Take 1 tablet (325 mg total) by mouth daily with breakfast.  3   insulin detemir (LEVEMIR FLEXTOUCH) 100 UNIT/ML FlexPen Inject 10 Units into the skin daily. 15 mL 11   Insulin Pen Needle (PEN NEEDLES 3/16") 31G X 5 MM MISC 1 applicator by Does not apply route 4 (four) times daily. 100 each 1   linagliptin (TRADJENTA) 5 MG  TABS tablet Take 1 tablet (5 mg total) by mouth daily. 90 tablet 3   LINZESS 145 MCG CAPS capsule Take 1 capsule by mouth at bedtime 90 capsule 0   melatonin 3 MG TABS tablet Take 3 mg by mouth at bedtime as needed (sleep).     methocarbamol (ROBAXIN) 500 MG tablet Take 1 tablet (500 mg total) by mouth in the morning and at bedtime. 60 tablet 0   montelukast (SINGULAIR) 10 MG tablet TAKE 1 TABLET BY MOUTH AT BEDTIME 90 tablet 3   ondansetron (ZOFRAN-ODT) 4 MG disintegrating tablet Take 1 tablet (4 mg total) by mouth every 6 (six) hours as needed for nausea or vomiting. 20 tablet 0   oxyCODONE-acetaminophen (PERCOCET/ROXICET) 5-325 MG tablet Take 1 tablet by mouth every 8 (eight) hours as needed for severe pain. 60 tablet 0   pantoprazole (PROTONIX) 40 MG tablet Take 1 tablet by mouth twice daily 180 tablet 0   rOPINIRole (REQUIP) 0.5 MG tablet Take 1 tablet (0.5 mg total) by mouth every evening. 90 tablet 1   torsemide (DEMADEX) 20 MG tablet Take 2 tablets by mouth once daily 180 tablet 0   traMADol (ULTRAM) 50 MG tablet Take one tab po bid prn for pain 180 tablet 0   vitamin B-12 1000 MCG tablet  Take 1 tablet (1,000 mcg total) by mouth daily.     warfarin (COUMADIN) 2 MG tablet TAKE 1 TABLET BY MOUTH ONCE DAILY EXCEPT ON WEDNESDAYS AND SATURDAYS TAKE WARFARIN '4MG'$  65 tablet 0   warfarin (COUMADIN) 4 MG tablet TAKE 1 TABLET BY MOUTH ON MONDAY, WEDNESDAY AND FRIDAY 36 tablet 3   No current facility-administered medications for this visit.    Family History Family History  Problem Relation Age of Onset   Diabetes Mother    Breast cancer Mother    Heart disease Father    Diabetes Father    Diabetes Sister    Diabetes Brother    Cancer Brother       Social History Social History   Tobacco Use   Smoking status: Former    Packs/day: 1.00    Types: Cigarettes    Quit date: 08/19/2022    Years since quitting: 0.2   Smokeless tobacco: Never   Tobacco comments:    1 pack daily---quit 1  month   Vaping Use   Vaping Use: Never used  Substance Use Topics   Alcohol use: No   Drug use: No        Review of Systems  All other systems reviewed and are negative.    Physical Exam Blood pressure 125/76, pulse (!) 125, temperature 98.4 F (36.9 C), temperature source Oral, height '4\' 10"'$  (1.473 m), weight 160 lb (72.6 kg), SpO2 97 %. Last Weight  Most recent update: 11/16/2022  1:50 PM    Weight  72.6 kg (160 lb)             CONSTITUTIONAL: Well developed, elderly, obese and nourished, appropriately responsive and aware without distress.   EYES: Sclera non-icteric.   EARS, NOSE, MOUTH AND THROAT: The oropharynx is clear. Oral mucosa is pink and moist.   Hearing is intact to voice.  NECK: Trachea is midline, and there is no jugular venous distension.  LYMPH NODES:  Lymph nodes in the neck are not appreciated. RESPIRATORY:   Normal respiratory effort without pathologic use of accessory muscles. CARDIOVASCULAR:  Well perfused.  GI: The abdomen is notable for significant abdominal wall asymmetry, having remarkable laxity of the right subcostal region.  It remains soft, nontender, and nondistended.  There is a long and wide scar in this area.  There were no palpable masses. I did not appreciate hepatosplenomegaly. There were normal bowel sounds. MUSCULOSKELETAL:  Symmetrical muscle tone appreciated in all four extremities.    SKIN: Skin turgor is normal. No pathologic skin lesions appreciated.  NEUROLOGIC:  Motor and sensation appear grossly normal.  Cranial nerves are grossly without defect. PSYCH:  Alert and oriented to person, place and time. Affect is appropriate for situation.  Data Reviewed I have personally reviewed what is currently available of the patient's imaging, recent labs and medical records.   Labs:     Latest Ref Rng & Units 10/19/2022    9:24 PM 12/01/2021    4:43 AM 11/30/2021   10:27 AM  CBC  WBC 4.0 - 10.5 K/uL 8.2  8.5  9.3   Hemoglobin 12.0 - 15.0  g/dL 12.0  10.6  13.0   Hematocrit 36.0 - 46.0 % 37.4  31.2  38.3   Platelets 150 - 400 K/uL 200  169  214       Latest Ref Rng & Units 10/19/2022    9:24 PM 12/01/2021   12:54 PM 12/01/2021    4:43 AM  CMP  Glucose 70 -  99 mg/dL 388  386  196   BUN 8 - 23 mg/dL 37   33   Creatinine 0.44 - 1.00 mg/dL 2.37   2.07   Sodium 135 - 145 mmol/L 134   132   Potassium 3.5 - 5.1 mmol/L 3.7   3.6   Chloride 98 - 111 mmol/L 100   100   CO2 22 - 32 mmol/L 24   25   Calcium 8.9 - 10.3 mg/dL 8.7   8.3   Total Protein 6.5 - 8.1 g/dL 7.6   5.9   Total Bilirubin 0.3 - 1.2 mg/dL 0.5   0.5   Alkaline Phos 38 - 126 U/L 90   80   AST 15 - 41 U/L 20   19   ALT 0 - 44 U/L 10   12     Imaging: Radiological images reviewed:  CLINICAL DATA:  82 year old female with suspected hernia or peritonitis. Presenting with RIGHT upper quadrant and RIGHT-sided abdominal pain and swelling for 4 weeks.   EXAM: CT ABDOMEN AND PELVIS WITHOUT CONTRAST   TECHNIQUE: Multidetector CT imaging of the abdomen and pelvis was performed following the standard protocol without IV contrast.   RADIATION DOSE REDUCTION: This exam was performed according to the departmental dose-optimization program which includes automated exposure control, adjustment of the mA and/or kV according to patient size and/or use of iterative reconstruction technique.   COMPARISON:  May 24, 2021.   FINDINGS: Lower chest: Basilar atelectasis. Mild subpleural reticulation at the lung bases similar to previous imaging. No signs of edema or effusion at the lung bases. Mitral annular calcifications with signs of cardiomegaly. No chest wall abnormality.   Hepatobiliary: Post cholecystectomy. No gross biliary duct dilation. No hepatic abnormality on noncontrast imaging.   Pancreas: Mild pancreatic atrophy without signs of inflammation or contour abnormality.   Spleen: Normal.   Adrenals/Urinary Tract: Normal adrenal glands. Solitary LEFT  kidney without hydronephrosis, ureteral calculus or nephrolithiasis. No perivesical stranding.   Cortical scarring of the LEFT kidney otherwise without contour abnormality.   Tubular structure in the RIGHT retroperitoneum with stable appearance showing extensive coarse calcification in its lower aspect and fluid density tracking towards the RIGHT renal fossa. This is likely dilated RIGHT ureter following remote RIGHT nephrectomy.   Stomach/Bowel: Small hiatal hernia. Mild distal esophageal thickening, nonspecific. No stranding adjacent to the stomach. Small duodenal diverticula. No stranding adjacent to small bowel or sign of small bowel obstruction. No hernia containing small bowel. Appendix not visible but no secondary signs that would suggest acute appendiceal process.   No acute colonic findings.   Vascular/Lymphatic:   Aortic atherosclerosis. No sign of aneurysm. Smooth contour of the IVC. There is no gastrohepatic or hepatoduodenal ligament lymphadenopathy. No retroperitoneal or mesenteric lymphadenopathy.   No pelvic sidewall lymphadenopathy.   Limited assessment of vascular structures due to lack of intravenous contrast.   Reproductive: Post hysterectomy.   Other: Body wall edema. No ascites. Laxity of the RIGHT abdominal wall with small fat containing RIGHT flank hernia (image 26/2) this area is unchanged. Thinning of rectus musculature associated likely with prior RIGHT nephrectomy also unchanged. No bowel containing hernia or acute process though stranding while likely related to dependent edema remains nonspecific. No pneumoperitoneum.   Musculoskeletal: Spinal degenerative changes. Osteopenia. No acute or destructive bone process.   IMPRESSION: 1. No acute findings in the abdomen or the pelvis. 2. Post RIGHT nephrectomy with stable dilation of the RIGHT ureter which shows dense calcification. Perhaps related to  dystrophic calcification showing no change  since 2019. It is unclear whether this could be a cause of RIGHT flank pain. If there is a history of chronic RIGHT-sided flank pain would consider this possibility. There is no stranding adjacent to this structure in the RIGHT retroperitoneum. 3. No LEFT-sided ureteral calculi or acute process related to the urinary tract. 4. Small hiatal hernia. Mild distal esophageal thickening, nonspecific. Mildly indistinct appearance of the is esophagus at the gastroesophageal junction. Dedicated esophageal evaluation may be helpful for further assessment. Findings could reflect esophagitis but remain nonspecific. 5. Aortic atherosclerosis.   Aortic Atherosclerosis (ICD10-I70.0).     Electronically Signed   By: Zetta Bills M.D.   On: 11/02/2022 15:11 Within last 24 hrs: No results found.  Assessment    Postoperative eventration of the right upper quadrant, likely secondary to neurolysis from access to the right kidney.  No evidence of ventral hernia. Patient Active Problem List   Diagnosis Date Noted   Abdominal pain, chronic, right upper quadrant 11/17/2022   Radial styloid tenosynovitis of left hand 07/26/2022   Pain in thumb joint with movement of left hand 05/29/2022   Acute pain of left wrist 05/29/2022   Closed fracture of left upper limb    Fall 11/30/2021   Traumatic closed fracture of surgical neck of left humerus with minimal displacement 11/30/2021   Ingrown nail of great toe of left foot 08/18/2021   Acute on chronic diastolic congestive heart failure ([place]) 05/24/2021   Type II diabetes mellitus with renal manifestations ([place]) 05/24/2021   HLD (hyperlipidemia) 05/24/2021   Chest pain 05/24/2021   Depression 05/24/2021   Legally blind 05/24/2021   Abdominal pain 05/24/2021   Chronic respiratory failure with hypoxia ([place]) 03/16/2021   SOB (shortness of breath) 03/15/2021   Benign hypertensive kidney disease with chronic kidney disease 03/14/2021   Chronic kidney  disease, stage IV (severe) ([place]) 03/14/2021   Acute renal failure superimposed on stage 4 chronic kidney disease ([place]) 02/08/2021   Anemia associated with stage 4 chronic renal failure ([place]) 02/08/2021   Anemia due to stage 5 chronic kidney disease ([place]) 02/07/2021   Acute CHF (congestive heart failure) ([place]) 02/06/2021   Acute on chronic diastolic CHF (congestive heart failure) ([place]) 02/06/2021   Hyperkalemia 02/06/2021   Weakness 12/24/2020   Hypertensive urgency 12/23/2020   Nicotine dependence 12/23/2020   History of anemia due to chronic kidney disease 12/23/2020   Pain due to onychomycosis of toenails of both feet 02/12/2020   Uncontrolled type 2 diabetes mellitus with hyperglycemia ([place]) 05/20/2019   Acute recurrent pansinusitis 05/11/2019   Acute otitis media 05/11/2019   Acute pain of left shoulder 05/11/2019   Long term (current) use of anticoagulants 04/03/2019   Chronic left shoulder pain 03/18/2019   Sepsis ([place]) 01/12/2018   Colitis 01/12/2018   CKD (chronic kidney disease), stage III ([place]) 01/12/2018   Diabetes ([place]) 12/28/2017   AF (paroxysmal atrial fibrillation) ([place]) 12/28/2017   Encounter for general adult medical examination with abnormal findings 12/28/2017   Primary generalized (osteo)arthritis 12/28/2017   Hypertension 12/27/2017   GERD (gastroesophageal reflux disease) 12/27/2017   Low back pain 02/20/2017   Degeneration of lumbar intervertebral disc 02/20/2017   Hematuria 03/18/2015   Chronic combined systolic and diastolic heart failure ([place]) 03/01/2012   Obesity 03/01/2012   CKD stage 4 due to type 2 diabetes mellitus ([place]) 03/01/2012   Other benign neoplasm of connective and other soft tissue of lower limb, including hip 03/15/2011  Neoplasm of connective tissue 02/01/2011   Pain in joint, pelvic region and thigh 02/01/2011   Type 2 diabetes mellitus with stage 5 chronic kidney disease ([place]) 10/03/2006   Encounter for current long-term use of  anticoagulants 02/07/2006   Essential hypertension 09/24/2004   Atrial fibrillation with RVR ([place]) 06/07/2004    Plan    Reassurance is given, we discussed utilization of an abdominal binder to assist her with her discomfort.  I believe this is understood, will be happy to follow-up or see this patient again if we can be of any further assistance thank you.  Face-to-face time spent with the patient and accompanying care providers(if present) was 30 minutes, with more than 50% of the time spent counseling, educating, and coordinating care of the patient.    These notes generated with voice recognition software. I apologize for typographical errors.  Ronny Bacon M.D., FACS 11/17/2022, 4:14 PM

## 2022-11-16 ENCOUNTER — Ambulatory Visit: Payer: Medicare HMO | Admitting: Surgery

## 2022-11-16 ENCOUNTER — Encounter: Payer: Self-pay | Admitting: Surgery

## 2022-11-16 ENCOUNTER — Other Ambulatory Visit: Payer: Self-pay

## 2022-11-16 ENCOUNTER — Other Ambulatory Visit: Payer: Self-pay | Admitting: Nurse Practitioner

## 2022-11-16 VITALS — BP 125/76 | HR 125 | Temp 98.4°F | Ht <= 58 in | Wt 160.0 lb

## 2022-11-16 DIAGNOSIS — Z6833 Body mass index (BMI) 33.0-33.9, adult: Secondary | ICD-10-CM | POA: Diagnosis not present

## 2022-11-16 DIAGNOSIS — E669 Obesity, unspecified: Secondary | ICD-10-CM

## 2022-11-16 DIAGNOSIS — R1011 Right upper quadrant pain: Secondary | ICD-10-CM | POA: Diagnosis not present

## 2022-11-16 DIAGNOSIS — G8929 Other chronic pain: Secondary | ICD-10-CM

## 2022-11-16 MED ORDER — OXYCODONE-ACETAMINOPHEN 5-325 MG PO TABS: 1.0000 | ORAL_TABLET | Freq: Three times a day (TID) | ORAL | 0 refills | Status: DC | PRN

## 2022-11-16 NOTE — Telephone Encounter (Signed)
[name] that we send med °

## 2022-11-16 NOTE — Patient Instructions (Signed)
You may try using an abdominal binder to wear for support. This can be purchased at [facility] or at a Medical supply store. Please call with any questions or concerns.

## 2022-11-17 DIAGNOSIS — G8929 Other chronic pain: Secondary | ICD-10-CM | POA: Insufficient documentation

## 2022-11-22 ENCOUNTER — Other Ambulatory Visit: Payer: Self-pay | Admitting: Nurse Practitioner

## 2022-11-22 DIAGNOSIS — I5042 Chronic combined systolic (congestive) and diastolic (congestive) heart failure: Secondary | ICD-10-CM

## 2022-11-23 ENCOUNTER — Other Ambulatory Visit: Payer: Self-pay | Admitting: Nurse Practitioner

## 2022-11-23 DIAGNOSIS — G2581 Restless legs syndrome: Secondary | ICD-10-CM

## 2022-11-24 ENCOUNTER — Ambulatory Visit (INDEPENDENT_AMBULATORY_CARE_PROVIDER_SITE_OTHER): Payer: Medicare HMO

## 2022-11-24 DIAGNOSIS — I4891 Unspecified atrial fibrillation: Secondary | ICD-10-CM

## 2022-11-24 NOTE — Progress Notes (Signed)
Patient INR 2.1. Per Alyssa no change.

## 2022-11-27 LAB — PROTIME-INR

## 2022-11-29 ENCOUNTER — Other Ambulatory Visit: Payer: Self-pay | Admitting: Nurse Practitioner

## 2022-11-29 DIAGNOSIS — K219 Gastro-esophageal reflux disease without esophagitis: Secondary | ICD-10-CM

## 2022-12-12 ENCOUNTER — Other Ambulatory Visit: Payer: Self-pay | Admitting: Internal Medicine

## 2022-12-12 ENCOUNTER — Telehealth: Payer: Self-pay

## 2022-12-12 ENCOUNTER — Ambulatory Visit (INDEPENDENT_AMBULATORY_CARE_PROVIDER_SITE_OTHER): Payer: Medicare HMO

## 2022-12-12 DIAGNOSIS — I4891 Unspecified atrial fibrillation: Secondary | ICD-10-CM | POA: Diagnosis not present

## 2022-12-12 LAB — PROTIME-INR

## 2022-12-12 MED ORDER — OXYCODONE-ACETAMINOPHEN 5-325 MG PO TABS: ORAL_TABLET | ORAL | 0 refills | Status: DC

## 2022-12-12 NOTE — Telephone Encounter (Signed)
Patient daughter stated she will need a refill on her Tramadol as well.

## 2022-12-12 NOTE — Telephone Encounter (Signed)
RX is sent, however please advise daughter not to overdo Oxycodone, take tramadol for mild pain and reserve Oxy for moderate to severe pain

## 2022-12-12 NOTE — Telephone Encounter (Signed)
Please check her refill history, she got 90 day supply last month

## 2022-12-12 NOTE — Progress Notes (Signed)
Patient INR is 3.0 per Alyssa no change for now.

## 2022-12-14 ENCOUNTER — Telehealth: Payer: Self-pay

## 2022-12-14 DIAGNOSIS — M150 Primary generalized (osteo)arthritis: Secondary | ICD-10-CM

## 2022-12-25 ENCOUNTER — Encounter: Payer: Self-pay | Admitting: Nurse Practitioner

## 2022-12-25 ENCOUNTER — Ambulatory Visit (INDEPENDENT_AMBULATORY_CARE_PROVIDER_SITE_OTHER): Payer: Medicare HMO | Admitting: Nurse Practitioner

## 2022-12-25 VITALS — BP 113/41 | HR 95 | Temp 97.9°F | Resp 16 | Ht <= 58 in | Wt 166.4 lb

## 2022-12-25 DIAGNOSIS — E1165 Type 2 diabetes mellitus with hyperglycemia: Secondary | ICD-10-CM

## 2022-12-25 DIAGNOSIS — I4891 Unspecified atrial fibrillation: Secondary | ICD-10-CM

## 2022-12-25 DIAGNOSIS — R2242 Localized swelling, mass and lump, left lower limb: Secondary | ICD-10-CM

## 2022-12-25 LAB — POCT GLYCOSYLATED HEMOGLOBIN (HGB A1C): Hemoglobin A1C: 9.2 % — AB (ref 4.0–5.6)

## 2022-12-25 MED ORDER — WARFARIN SODIUM 2 MG PO TABS: ORAL_TABLET | ORAL | 0 refills | Status: DC

## 2022-12-25 MED ORDER — WARFARIN SODIUM 4 MG PO TABS: ORAL_TABLET | ORAL | 3 refills | Status: DC

## 2022-12-25 NOTE — Progress Notes (Signed)
[facility] [place], [place] [address]  Internal MEDICINE  Office Visit Note  Patient Name: [name]  [id number]  [id number]  Date of Service: 12/25/2022  Chief Complaint  Patient presents with   Follow-up   Gastroesophageal Reflux   Diabetes   Hypertension   Hyperlipidemia    HPI [name] presents for a follow-up visit for diabetes, CHF, and a leg mass/tumor.  Sodium levels fluctuating gave an extra fluid pill and her weight came down and fluid was better overnight.  Tender mass, painfull left lateral upper leg. --had a previous benign tumor removed from the left lateral upper leg that was removed by [facility] general surgery. She was told that this tumor may come back.  INR has been good.  A1c is 9.2 today---- eating cookies, pepsi, ravioli and bread.     Current Medication: Outpatient Encounter Medications as of 12/25/2022  Medication Sig   amLODipine (NORVASC) 5 MG tablet Take 1 tablet (5 mg total) by mouth daily.   atorvastatin (LIPITOR) 10 MG tablet Take 1 tablet (10 mg total) by mouth every evening.   citalopram (CELEXA) 20 MG tablet Take 1 tablet (20 mg total) by mouth daily.   cyclobenzaprine (FLEXERIL) 5 MG tablet Take 1 tablet (5 mg total) by mouth at bedtime.   donepezil (ARICEPT) 5 MG tablet TAKE 1 TABLET BY MOUTH IN THE MORNING   ferrous sulfate 325 (65 FE) MG tablet Take 1 tablet (325 mg total) by mouth daily with breakfast.   insulin detemir (LEVEMIR FLEXTOUCH) 100 UNIT/ML FlexPen Inject 10 Units into the skin daily.   Insulin Pen Needle (PEN NEEDLES 3/16") 31G X 5 MM MISC 1 applicator by Does not apply route 4 (four) times daily.   linagliptin (TRADJENTA) 5 MG TABS tablet Take 1 tablet (5 mg total) by mouth daily.   LINZESS 145 MCG CAPS capsule Take 1 capsule by mouth at bedtime   melatonin 3 MG TABS tablet Take 3 mg by mouth at bedtime as needed (sleep).   methocarbamol (ROBAXIN) 500 MG tablet Take 1 tablet (500 mg total) by mouth in the  morning and at bedtime.   montelukast (SINGULAIR) 10 MG tablet TAKE 1 TABLET BY MOUTH AT BEDTIME   ondansetron (ZOFRAN-ODT) 4 MG disintegrating tablet Take 1 tablet (4 mg total) by mouth every 6 (six) hours as needed for nausea or vomiting.   oxyCODONE-acetaminophen (PERCOCET/ROXICET) 5-325 MG tablet Take one tab po bid for severe pain only   pantoprazole (PROTONIX) 40 MG tablet Take 1 tablet by mouth twice daily   rOPINIRole (REQUIP) 0.5 MG tablet Take 1 tablet by mouth in the evening   torsemide (DEMADEX) 20 MG tablet Take 2 tablets by mouth once daily   traMADol (ULTRAM) 50 MG tablet Take one tab po bid prn for pain   vitamin B-12 1000 MCG tablet Take 1 tablet (1,000 mcg total) by mouth daily.   [DISCONTINUED] warfarin (COUMADIN) 2 MG tablet TAKE 1 TABLET BY MOUTH ONCE DAILY EXCEPT ON WEDNESDAYS AND SATURDAYS TAKE WARFARIN '4MG'$    [DISCONTINUED] warfarin (COUMADIN) 4 MG tablet TAKE 1 TABLET BY MOUTH ON MONDAY, WEDNESDAY AND FRIDAY   warfarin (COUMADIN) 2 MG tablet TAKE 1 TABLET BY MOUTH ONCE DAILY EXCEPT ON WEDNESDAYS AND SATURDAYS TAKE WARFARIN '4MG'$    warfarin (COUMADIN) 4 MG tablet TAKE 1 TABLET BY MOUTH ON MONDAY, WEDNESDAY AND FRIDAY   No facility-administered encounter medications on file as of 12/25/2022.    Surgical History: Past Surgical History:  Procedure  Laterality Date   ABDOMINAL HYSTERECTOMY     APPENDECTOMY     CATARACT EXTRACTION     CHOLECYSTECTOMY     HERNIA REPAIR     right knee replacement     right nephroectomy      Medical History: Past Medical History:  Diagnosis Date   Anemia    Atrial fibrillation (HCC)    Chronic kidney disease    Congestive heart failure (CHF) (HCC)    COPD (chronic obstructive pulmonary disease) (HCC)    Diabetes mellitus without complication (HCC)    GERD (gastroesophageal reflux disease)    Hyperlipidemia    Hypertension    Pneumonia    Pulmonary fibrosis ([place])     Family History: Family History  Problem Relation Age of Onset    Diabetes Mother    Breast cancer Mother    Heart disease Father    Diabetes Father    Diabetes Sister    Diabetes Brother    Cancer Brother     Social History   Socioeconomic History   Marital status: Widowed    Spouse name: Not on file   Number of children: Not on file   Years of education: Not on file   Highest education level: Not on file  Occupational History   Occupation: retired  Tobacco Use   Smoking status: Former    Packs/day: 1.00    Types: Cigarettes    Quit date: 08/19/2022    Years since quitting: 0.3   Smokeless tobacco: Never   Tobacco comments:    1 pack daily---quit 1 month   Vaping Use   Vaping Use: Never used  Substance and Sexual Activity   Alcohol use: No   Drug use: No   Sexual activity: Not Currently  Other Topics Concern   Not on file  Social History Narrative   Lives with daughter   Social Determinants of Health   Financial Resource Strain: Not on file  Food Insecurity: Not on file  Transportation Needs: Not on file  Physical Activity: Not on file  Stress: Not on file  Social Connections: Not on file  Intimate Partner Violence: Not on file      Review of Systems  Constitutional:  Negative for chills, fatigue and unexpected weight change.  HENT: Negative.  Negative for congestion, rhinorrhea, sneezing and sore throat.   Eyes:  Negative for redness.  Respiratory: Negative.  Negative for cough, chest tightness, shortness of breath and wheezing.   Cardiovascular: Negative.  Negative for chest pain and palpitations.  Gastrointestinal: Negative.  Negative for abdominal pain, constipation, diarrhea, nausea and vomiting.  Genitourinary:  Negative for dysuria and frequency.  Musculoskeletal:  Positive for arthralgias, back pain and myalgias (left lateral upper leg). Negative for joint swelling and neck pain.  Skin:  Negative for rash.  Neurological: Negative.  Negative for tremors and numbness.  Hematological:  Negative for adenopathy.  Does not bruise/bleed easily.  Psychiatric/Behavioral:  Negative for behavioral problems (Depression), sleep disturbance and suicidal ideas. The patient is not nervous/anxious.     Vital Signs: BP (!) 113/41   Pulse 95   Temp 97.9 F (36.6 C)   Resp 16   Ht '4\' 10"'$  (1.473 m)   Wt 166 lb 6.4 oz (75.5 kg)   SpO2 93%   BMI 34.78 kg/m    Physical Exam Vitals reviewed.  Constitutional:      Appearance: Normal appearance. She is obese. She is not ill-appearing.  HENT:     Head:  Normocephalic and atraumatic.  Eyes:     Pupils: Pupils are equal, round, and reactive to light.  Cardiovascular:     Rate and Rhythm: Normal rate and regular rhythm.  Pulmonary:     Effort: Pulmonary effort is normal. No respiratory distress.  Musculoskeletal:        General: Tenderness present.       Legs:     Comments: Marked site is area of firm tender mass in the same area where a benign tumor was previously removed.   Neurological:     Mental Status: She is alert and oriented to person, place, and time.  Psychiatric:        Mood and Affect: Mood normal.        Behavior: Behavior normal.        Assessment/Plan: 1. Mass of left lower extremity Referred to [facility] general surgery for further evaluation.  - Ambulatory referral to General Surgery  2. Uncontrolled type 2 diabetes mellitus with hyperglycemia (HCC) A1c elevated at 9.2. increased from previous. Discussed decreasing cookies, pepsi, pastas and bread in her daily diet.  - POCT glycosylated hemoglobin (Hb A1C)  3. Atrial fibrillation, unspecified type (HCC) Refills of warfarin ordered. Continue as prescribed and continue checking INR as instructed.  - warfarin (COUMADIN) 4 MG tablet; TAKE 1 TABLET BY MOUTH ON MONDAY, WEDNESDAY AND FRIDAY  Dispense: 36 tablet; Refill: 3 - warfarin (COUMADIN) 2 MG tablet; TAKE 1 TABLET BY MOUTH ONCE DAILY EXCEPT ON WEDNESDAYS AND SATURDAYS TAKE WARFARIN '4MG'$   Dispense: 65 tablet; Refill: 0   General  Counseling: [name] verbalizes understanding of the findings of todays visit and agrees with plan of treatment. I have discussed any further diagnostic evaluation that may be needed or ordered today. We also reviewed her medications today. she has been encouraged to call the office with any questions or concerns that should arise related to todays visit.    Orders Placed This Encounter  Procedures   Ambulatory referral to General Surgery   POCT glycosylated hemoglobin (Hb A1C)    Meds ordered this encounter  Medications   warfarin (COUMADIN) 4 MG tablet    Sig: TAKE 1 TABLET BY MOUTH ON MONDAY, WEDNESDAY AND FRIDAY    Dispense:  36 tablet    Refill:  3   warfarin (COUMADIN) 2 MG tablet    Sig: TAKE 1 TABLET BY MOUTH ONCE DAILY EXCEPT ON WEDNESDAYS AND SATURDAYS TAKE WARFARIN '4MG'$     Dispense:  65 tablet    Refill:  0    Return in about 18 weeks (around 04/30/2023) for F/U, Recheck A1C, Myalynn Lingle PCP and keep june appt too.   Total time spent:30 Minutes Time spent includes review of chart, medications, test results, and follow up plan with the patient.   [place] Controlled Substance Database was reviewed by me.  This patient was seen by Jonetta Osgood, FNP-C in collaboration with Dr. Clayborn Bigness as a part of collaborative care agreement.   Jakiah Goree R. Valetta Fuller, MSN, FNP-C Internal medicine

## 2022-12-28 ENCOUNTER — Ambulatory Visit: Payer: Medicare HMO | Admitting: Podiatry

## 2022-12-29 ENCOUNTER — Ambulatory Visit (INDEPENDENT_AMBULATORY_CARE_PROVIDER_SITE_OTHER): Payer: Medicare HMO

## 2022-12-29 DIAGNOSIS — Z7901 Long term (current) use of anticoagulants: Secondary | ICD-10-CM | POA: Diagnosis not present

## 2022-12-29 LAB — PROTIME-INR

## 2022-12-29 LAB — POCT INR: INR: 3.7 — AB (ref 2.0–3.0)

## 2022-12-29 NOTE — Progress Notes (Signed)
Patient's INR today was 3.7. Per Alyssa, patient will take '2mg'$  Coumadin tomorrow (12/29/22) and we will recheck INR in 1 week.

## 2023-01-05 MED ORDER — TRAMADOL HCL 50 MG PO TABS: ORAL_TABLET | ORAL | 0 refills | Status: DC

## 2023-01-08 NOTE — Telephone Encounter (Signed)
done

## 2023-01-09 ENCOUNTER — Telehealth: Payer: Self-pay

## 2023-01-09 MED ORDER — OXYCODONE-ACETAMINOPHEN 5-325 MG PO TABS: ORAL_TABLET | ORAL | 0 refills | Status: DC

## 2023-01-09 NOTE — Telephone Encounter (Signed)
Patient daughter notified

## 2023-01-15 DIAGNOSIS — Z7901 Long term (current) use of anticoagulants: Secondary | ICD-10-CM | POA: Diagnosis not present

## 2023-01-16 ENCOUNTER — Other Ambulatory Visit: Payer: Self-pay

## 2023-01-16 ENCOUNTER — Ambulatory Visit (INDEPENDENT_AMBULATORY_CARE_PROVIDER_SITE_OTHER): Payer: Medicare HMO

## 2023-01-16 DIAGNOSIS — I4891 Unspecified atrial fibrillation: Secondary | ICD-10-CM

## 2023-01-16 MED ORDER — WARFARIN SODIUM 2 MG PO TABS: ORAL_TABLET | ORAL | 5 refills | Status: DC

## 2023-01-16 MED ORDER — WARFARIN SODIUM 2 MG PO TABS: ORAL_TABLET | ORAL | 1 refills | Status: DC

## 2023-01-16 NOTE — Progress Notes (Signed)
Pt  INR 3.0  as per DFK no change take coumadin 2 mg Tues,Thursday,Saturday and Sunday and take coumadin 4 mg mon  and Wednesday and Friday spoke with pt daughter

## 2023-01-17 DIAGNOSIS — M79604 Pain in right leg: Secondary | ICD-10-CM | POA: Diagnosis not present

## 2023-01-17 DIAGNOSIS — M79605 Pain in left leg: Secondary | ICD-10-CM | POA: Diagnosis not present

## 2023-01-18 ENCOUNTER — Telehealth: Payer: Self-pay

## 2023-01-18 LAB — PROTIME-INR

## 2023-01-18 NOTE — Telephone Encounter (Signed)
Pt  daughter called that she take her to orthopedic and they said she had edema she gave her extra torsemide last night as per dr Humphrey Rolls advised her that she can take extra torsemide today and 1 tab extra for tramadol is getting worse go to ED or we can see her Monday and I already made her appt

## 2023-01-22 ENCOUNTER — Telehealth: Payer: Self-pay | Admitting: Nurse Practitioner

## 2023-01-22 ENCOUNTER — Ambulatory Visit (INDEPENDENT_AMBULATORY_CARE_PROVIDER_SITE_OTHER): Payer: Medicare HMO | Admitting: Nurse Practitioner

## 2023-01-22 ENCOUNTER — Encounter: Payer: Self-pay | Admitting: Nurse Practitioner

## 2023-01-22 VITALS — BP 141/66 | HR 89 | Temp 97.7°F | Resp 16 | Ht <= 58 in | Wt 168.9 lb

## 2023-01-22 DIAGNOSIS — I890 Lymphedema, not elsewhere classified: Secondary | ICD-10-CM | POA: Diagnosis not present

## 2023-01-22 DIAGNOSIS — I5042 Chronic combined systolic (congestive) and diastolic (congestive) heart failure: Secondary | ICD-10-CM

## 2023-01-22 MED ORDER — TORSEMIDE 20 MG PO TABS: 50.0000 mg | ORAL_TABLET | Freq: Every day | ORAL | 1 refills | Status: DC

## 2023-01-22 NOTE — Progress Notes (Signed)
[facility] [place], [place] [address]  Internal MEDICINE  Office Visit Note  Patient Name: [name]  [id number]  [id number]  Date of Service: 01/22/2023  Chief Complaint  Patient presents with   Acute Visit    Knee pain and swelling     HPI [name] presents for an acute sick visit for swelling of both legs.  Started more than 1 month ago.  Was initially just retaining some fluid Then thought it could be cellulitis Now having to take an extra fluid pill more often Legs feel swollen, heavy and tight. Most of the swelling is around her knee and in her upper leg. Left leg is worse than right Edema is pitting, legs are tender to touch. No redness or increased warmth noted.       Current Medication:  Outpatient Encounter Medications as of 01/22/2023  Medication Sig   amLODipine (NORVASC) 5 MG tablet Take 1 tablet (5 mg total) by mouth daily.   atorvastatin (LIPITOR) 10 MG tablet Take 1 tablet (10 mg total) by mouth every evening.   citalopram (CELEXA) 20 MG tablet Take 1 tablet (20 mg total) by mouth daily.   cyclobenzaprine (FLEXERIL) 5 MG tablet Take 1 tablet (5 mg total) by mouth at bedtime.   donepezil (ARICEPT) 5 MG tablet TAKE 1 TABLET BY MOUTH IN THE MORNING   ferrous sulfate 325 (65 FE) MG tablet Take 1 tablet (325 mg total) by mouth daily with breakfast.   insulin detemir (LEVEMIR FLEXTOUCH) 100 UNIT/ML FlexPen Inject 10 Units into the skin daily.   Insulin Pen Needle (PEN NEEDLES 3/16") 31G X 5 MM MISC 1 applicator by Does not apply route 4 (four) times daily.   linagliptin (TRADJENTA) 5 MG TABS tablet Take 1 tablet (5 mg total) by mouth daily.   LINZESS 145 MCG CAPS capsule Take 1 capsule by mouth at bedtime   melatonin 3 MG TABS tablet Take 3 mg by mouth at bedtime as needed (sleep).   methocarbamol (ROBAXIN) 500 MG tablet Take 1 tablet (500 mg total) by mouth in the morning and at bedtime.   montelukast (SINGULAIR) 10 MG tablet TAKE 1  TABLET BY MOUTH AT BEDTIME   ondansetron (ZOFRAN-ODT) 4 MG disintegrating tablet Take 1 tablet (4 mg total) by mouth every 6 (six) hours as needed for nausea or vomiting.   oxyCODONE-acetaminophen (PERCOCET/ROXICET) 5-325 MG tablet Take one tab po bid for severe pain only   pantoprazole (PROTONIX) 40 MG tablet Take 1 tablet by mouth twice daily   rOPINIRole (REQUIP) 0.5 MG tablet Take 1 tablet by mouth in the evening   traMADol (ULTRAM) 50 MG tablet Take one tab po bid prn for pain   vitamin B-12 1000 MCG tablet Take 1 tablet (1,000 mcg total) by mouth daily.   warfarin (COUMADIN) 2 MG tablet Take 1 tab po Tuesday ,Thursday ,Friday ad Saturday and Sunday and then take 4 mg rest of week   warfarin (COUMADIN) 4 MG tablet TAKE 1 TABLET BY MOUTH ON MONDAY, WEDNESDAY AND FRIDAY   [DISCONTINUED] torsemide (DEMADEX) 20 MG tablet Take 2 tablets by mouth once daily   torsemide (DEMADEX) 20 MG tablet Take 2.5 tablets (50 mg total) by mouth daily.   No facility-administered encounter medications on file as of 01/22/2023.      Medical History: Past Medical History:  Diagnosis Date   Anemia    Atrial fibrillation    Chronic kidney disease    Congestive heart failure (CHF)  COPD (chronic obstructive pulmonary disease)    Diabetes mellitus without complication    GERD (gastroesophageal reflux disease)    Hyperlipidemia    Hypertension    Pneumonia    Pulmonary fibrosis      Vital Signs: BP (!) 141/66   Pulse 89   Temp 97.7 F (36.5 C)   Resp 16   Ht 4\' 10"  (1.473 m)   Wt 168 lb 14.4 oz (76.6 kg)   SpO2 95%   BMI 35.30 kg/m    Review of Systems  Constitutional:  Positive for fatigue.  HENT: Negative.    Respiratory:  Negative for cough, chest tightness, shortness of breath and wheezing.   Cardiovascular:  Positive for leg swelling.  Gastrointestinal: Negative.   Musculoskeletal:  Positive for arthralgias, gait problem and joint swelling.  Skin:        Thin skin, tight skin on  legs  Neurological:  Positive for weakness.    Physical Exam Vitals reviewed.  Constitutional:      General: She is not in acute distress.    Appearance: Normal appearance. She is obese. She is not ill-appearing.  HENT:     Head: Normocephalic and atraumatic.  Eyes:     Pupils: Pupils are equal, round, and reactive to light.  Cardiovascular:     Rate and Rhythm: Normal rate and regular rhythm.     Heart sounds: Normal heart sounds. No murmur heard. Pulmonary:     Effort: Pulmonary effort is normal. No respiratory distress.     Breath sounds: Normal breath sounds. No wheezing.  Musculoskeletal:     Right upper leg: Swelling, edema and tenderness present.     Left upper leg: Swelling, edema and tenderness present.     Right knee: Swelling present. Decreased range of motion. Tenderness present.     Left knee: Swelling present. Decreased range of motion. Tenderness present.     Right lower leg: Swelling and tenderness present. 2+ Pitting Edema present.     Left lower leg: Swelling and tenderness present. 2+ Pitting Edema present.  Neurological:     Mental Status: She is alert. Mental status is at baseline.  Psychiatric:        Mood and Affect: Mood normal.        Behavior: Behavior normal.       Assessment/Plan: 1. Lymphedema of both lower extremities Repeat lab to check potassium level, kidney function and fluid status Referred to lymphedema clinic for evaluation and treatment Torsemide dose increased to 50 mg daily If still needing an extra tablet, patient's daughter will call to get dose adjusted again  - CMP14+EGFR - Ambulatory referral to Physical Therapy  2. Chronic combined systolic and diastolic heart failure Repeat lab, referred to lymphedema clinic, increased torsemide dose. - CMP14+EGFR - Ambulatory referral to Physical Therapy - torsemide (DEMADEX) 20 MG tablet; Take 2.5 tablets (50 mg total) by mouth daily.  Dispense: 225 tablet; Refill: 1   General  Counseling: [name] verbalizes understanding of the findings of todays visit and agrees with plan of treatment. I have discussed any further diagnostic evaluation that may be needed or ordered today. We also reviewed her medications today. she has been encouraged to call the office with any questions or concerns that should arise related to todays visit.    Counseling:    Orders Placed This Encounter  Procedures   CMP14+EGFR   Ambulatory referral to Physical Therapy    Meds ordered this encounter  Medications   torsemide (DEMADEX)  20 MG tablet    Sig: Take 2.5 tablets (50 mg total) by mouth daily.    Dispense:  225 tablet    Refill:  1    Return for follow up as scheduled in may.  [place] Controlled Substance Database was reviewed by me for overdose risk score (ORS)  Time spent:20 Minutes Time spent with patient included reviewing progress notes, labs, imaging studies, and discussing plan for follow up.   This patient was seen by Jonetta Osgood, FNP-C in collaboration with Dr. Clayborn Bigness as a part of collaborative care agreement.  Kayron Hicklin R. Valetta Fuller, MSN, FNP-C Internal Medicine

## 2023-01-22 NOTE — Telephone Encounter (Signed)
Awaiting 01/22/23 office notes for PT referral-Toni

## 2023-01-23 ENCOUNTER — Telehealth: Payer: Self-pay | Admitting: Nurse Practitioner

## 2023-01-23 LAB — CMP14+EGFR
ALT: 7 IU/L (ref 0–32)
AST: 14 IU/L (ref 0–40)
Albumin/Globulin Ratio: 1.1 — ABNORMAL LOW (ref 1.2–2.2)
Albumin: 3.6 g/dL — ABNORMAL LOW (ref 3.7–4.7)
Alkaline Phosphatase: 96 IU/L (ref 44–121)
BUN/Creatinine Ratio: 12 (ref 12–28)
BUN: 29 mg/dL — ABNORMAL HIGH (ref 8–27)
Bilirubin Total: 0.4 mg/dL (ref 0.0–1.2)
CO2: 23 mmol/L (ref 20–29)
Calcium: 8.8 mg/dL (ref 8.7–10.3)
Chloride: 99 mmol/L (ref 96–106)
Creatinine, Ser: 2.35 mg/dL — ABNORMAL HIGH (ref 0.57–1.00)
Globulin, Total: 3.2 g/dL (ref 1.5–4.5)
Glucose: 323 mg/dL — ABNORMAL HIGH (ref 70–99)
Potassium: 4 mmol/L (ref 3.5–5.2)
Sodium: 139 mmol/L (ref 134–144)
Total Protein: 6.8 g/dL (ref 6.0–8.5)
eGFR: 20 mL/min/{1.73_m2} — ABNORMAL LOW (ref >59)

## 2023-01-23 NOTE — Telephone Encounter (Signed)
Urgent PT referral faxed to [facility] in [place] ; [phone]

## 2023-01-24 NOTE — Progress Notes (Signed)
No significant change in lab results, potassium level was normal and sodium level improved to normal

## 2023-01-26 ENCOUNTER — Telehealth: Payer: Self-pay

## 2023-01-26 NOTE — Telephone Encounter (Signed)
Left message for patient to give office a call.

## 2023-01-30 ENCOUNTER — Ambulatory Visit (INDEPENDENT_AMBULATORY_CARE_PROVIDER_SITE_OTHER): Payer: Medicare HMO

## 2023-01-30 ENCOUNTER — Telehealth: Payer: Self-pay

## 2023-01-30 DIAGNOSIS — I4891 Unspecified atrial fibrillation: Secondary | ICD-10-CM | POA: Diagnosis not present

## 2023-01-30 NOTE — Telephone Encounter (Signed)
Pt advised for labs  

## 2023-01-30 NOTE — Telephone Encounter (Signed)
-----   Message from Sallyanne Kuster, NP sent at 01/24/2023  5:00 PM EDT ----- No significant change in lab results, potassium level was normal and sodium level improved to normal

## 2023-01-30 NOTE — Progress Notes (Signed)
Pt INR 4.1 as per alyssa pt daughter advised  that Skip today(Tuesday ) then take coumadin 2 mg wed,Thursday ,sat and Sunday and TUES and take coumadin 4 mg on Monday  and Friday and call us back Tuesday

## 2023-01-31 LAB — PROTIME-INR

## 2023-02-03 ENCOUNTER — Other Ambulatory Visit: Payer: Self-pay | Admitting: Nurse Practitioner

## 2023-02-03 DIAGNOSIS — E782 Mixed hyperlipidemia: Secondary | ICD-10-CM

## 2023-02-03 DIAGNOSIS — G3184 Mild cognitive impairment, so stated: Secondary | ICD-10-CM

## 2023-02-04 ENCOUNTER — Other Ambulatory Visit: Payer: Self-pay | Admitting: Nurse Practitioner

## 2023-02-04 DIAGNOSIS — K5901 Slow transit constipation: Secondary | ICD-10-CM

## 2023-02-09 ENCOUNTER — Telehealth: Payer: Self-pay

## 2023-02-10 MED ORDER — OXYCODONE-ACETAMINOPHEN 5-325 MG PO TABS: 1.0000 | ORAL_TABLET | Freq: Two times a day (BID) | ORAL | 0 refills | Status: DC | PRN

## 2023-02-12 NOTE — Telephone Encounter (Signed)
Patient daughter notified

## 2023-02-14 ENCOUNTER — Ambulatory Visit (INDEPENDENT_AMBULATORY_CARE_PROVIDER_SITE_OTHER): Payer: Medicare HMO

## 2023-02-14 DIAGNOSIS — I4891 Unspecified atrial fibrillation: Secondary | ICD-10-CM | POA: Diagnosis not present

## 2023-02-14 NOTE — Progress Notes (Signed)
Pt INR 2.1 as per alyssa advised pt daughter continue same med coumadin 2 mg Tues,Wednesday,Thursday ,Saturday and Sunday and take coumadin 4 mg on Monday and Friday

## 2023-02-15 LAB — PROTIME-INR

## 2023-02-19 ENCOUNTER — Other Ambulatory Visit: Payer: Self-pay | Admitting: Nurse Practitioner

## 2023-02-19 DIAGNOSIS — G2581 Restless legs syndrome: Secondary | ICD-10-CM

## 2023-02-20 ENCOUNTER — Other Ambulatory Visit: Payer: Self-pay | Admitting: Nurse Practitioner

## 2023-02-20 ENCOUNTER — Ambulatory Visit: Payer: Self-pay

## 2023-02-20 DIAGNOSIS — G2581 Restless legs syndrome: Secondary | ICD-10-CM

## 2023-02-20 NOTE — Patient Outreach (Signed)
  Care Coordination   02/20/2023 Name: [name] MRN: [id number] DOB: 24-Apr-1941   Care Coordination Outreach Attempts:  A second unsuccessful outreach was attempted today to offer the patient with information about available care coordination services as a benefit of their health plan.     Follow Up Plan:  Additional outreach attempts will be made to offer the patient care coordination information and services.   Encounter Outcome:  No Answer   Care Coordination Interventions:  No, not indicated    SIG Lysle Morales, BSW Social Worker [facility] Care Management  [phone]

## 2023-02-22 ENCOUNTER — Ambulatory Visit: Payer: Medicare HMO | Admitting: Podiatry

## 2023-02-22 ENCOUNTER — Ambulatory Visit: Payer: Self-pay

## 2023-02-22 NOTE — Patient Outreach (Signed)
  Care Coordination   02/22/2023 Name: [name] MRN: [id number] DOB: Oct 26, 1940   Care Coordination Outreach Attempts:  A third unsuccessful outreach was attempted today to offer the patient with information about available care coordination services.  Follow Up Plan:  No further outreach attempts will be made at this time. We have been unable to contact the patient to offer or enroll patient in care coordination services  Encounter Outcome:  No Answer   Care Coordination Interventions:  No, not indicated    SIG Lysle Morales, BSW Social Worker [facility] Care Management  [phone]

## 2023-02-27 ENCOUNTER — Other Ambulatory Visit: Payer: Self-pay | Admitting: Nurse Practitioner

## 2023-02-27 DIAGNOSIS — K219 Gastro-esophageal reflux disease without esophagitis: Secondary | ICD-10-CM

## 2023-02-28 ENCOUNTER — Other Ambulatory Visit: Payer: Self-pay | Admitting: Nurse Practitioner

## 2023-02-28 DIAGNOSIS — I10 Essential (primary) hypertension: Secondary | ICD-10-CM

## 2023-03-05 ENCOUNTER — Ambulatory Visit (INDEPENDENT_AMBULATORY_CARE_PROVIDER_SITE_OTHER): Payer: Medicare HMO

## 2023-03-05 DIAGNOSIS — I480 Paroxysmal atrial fibrillation: Secondary | ICD-10-CM

## 2023-03-05 NOTE — Progress Notes (Signed)
Home INR is 2.9. Per Alyssa no change. Continue same med coumadin 2 mg Tues,Wednesday,Thursday ,Saturday and Sunday and take coumadin 4 mg on Monday and Friday

## 2023-03-06 LAB — PROTIME-INR

## 2023-03-12 ENCOUNTER — Ambulatory Visit (INDEPENDENT_AMBULATORY_CARE_PROVIDER_SITE_OTHER): Payer: Medicare HMO | Admitting: Physician Assistant

## 2023-03-12 ENCOUNTER — Other Ambulatory Visit: Payer: Self-pay | Admitting: Nurse Practitioner

## 2023-03-12 ENCOUNTER — Encounter: Payer: Self-pay | Admitting: Physician Assistant

## 2023-03-12 VITALS — BP 128/54 | HR 93 | Temp 97.8°F | Resp 16 | Ht <= 58 in | Wt 158.0 lb

## 2023-03-12 DIAGNOSIS — L89329 Pressure ulcer of left buttock, unspecified stage: Secondary | ICD-10-CM | POA: Diagnosis not present

## 2023-03-12 DIAGNOSIS — M150 Primary generalized (osteo)arthritis: Secondary | ICD-10-CM

## 2023-03-12 NOTE — Progress Notes (Signed)
[facility] [address] [place], [place] [address]  Internal MEDICINE  Office Visit Note  Patient Name: [name]  [id number]  [id number]  Date of Service: 03/21/2023  Chief Complaint  Patient presents with   Acute Visit    Sore on back      HPI Pt is here for a sick visit. Her daughter is with her today -Painful sore on left buttock. Has been using a topical antibiotic to try to help. Thinks it has been getting worse -Will try using barrier cream and pressure pads to help cushion and will change positions frequently. Will go ahead and refer to wound clinic as well since she is also a diabetic  Current Medication:  Outpatient Encounter Medications as of 03/12/2023  Medication Sig   amLODipine (NORVASC) 5 MG tablet Take 1 tablet by mouth once daily   atorvastatin (LIPITOR) 10 MG tablet TAKE 1 TABLET BY MOUTH ONCE DAILY IN THE EVENING   citalopram (CELEXA) 20 MG tablet Take 1 tablet (20 mg total) by mouth daily.   donepezil (ARICEPT) 5 MG tablet TAKE 1 TABLET BY MOUTH IN THE MORNING   ferrous sulfate 325 (65 FE) MG tablet Take 1 tablet (325 mg total) by mouth daily with breakfast.   Insulin Pen Needle (PEN NEEDLES 3/16") 31G X 5 MM MISC 1 applicator by Does not apply route 4 (four) times daily.   linagliptin (TRADJENTA) 5 MG TABS tablet Take 1 tablet (5 mg total) by mouth daily.   LINZESS 145 MCG CAPS capsule Take 1 capsule by mouth at bedtime   melatonin 3 MG TABS tablet Take 3 mg by mouth at bedtime as needed (sleep).   methocarbamol (ROBAXIN) 500 MG tablet Take 1 tablet (500 mg total) by mouth in the morning and at bedtime.   montelukast (SINGULAIR) 10 MG tablet TAKE 1 TABLET BY MOUTH AT BEDTIME   ondansetron (ZOFRAN-ODT) 4 MG disintegrating tablet Take 1 tablet (4 mg total) by mouth every 6 (six) hours as needed for nausea or vomiting.   oxyCODONE-acetaminophen (PERCOCET/ROXICET) 5-325 MG tablet Take 1 tablet by mouth 2 (two) times daily as needed for severe  pain.   oxyCODONE-acetaminophen (PERCOCET/ROXICET) 5-325 MG tablet Take 1 tablet by mouth 2 (two) times daily as needed for severe pain.   pantoprazole (PROTONIX) 40 MG tablet Take 1 tablet by mouth twice daily   rOPINIRole (REQUIP) 0.5 MG tablet Take 1 tablet by mouth in the evening   torsemide (DEMADEX) 20 MG tablet Take 2.5 tablets (50 mg total) by mouth daily.   traMADol (ULTRAM) 50 MG tablet Take one tab po bid prn for pain   vitamin B-12 1000 MCG tablet Take 1 tablet (1,000 mcg total) by mouth daily.   warfarin (COUMADIN) 4 MG tablet TAKE 1 TABLET BY MOUTH ON MONDAY, WEDNESDAY AND FRIDAY   [DISCONTINUED] cyclobenzaprine (FLEXERIL) 5 MG tablet Take 1 tablet (5 mg total) by mouth at bedtime.   [DISCONTINUED] insulin detemir (LEVEMIR FLEXTOUCH) 100 UNIT/ML FlexPen Inject 10 Units into the skin daily.   warfarin (COUMADIN) 2 MG tablet Take 1 tab po Tuesday ,Thursday ,Friday ad Saturday and Sunday and then take 4 mg rest of week   No facility-administered encounter medications on file as of 03/12/2023.      Medical History: Past Medical History:  Diagnosis Date   Anemia    Atrial fibrillation (HCC)    Chronic kidney disease    Congestive heart failure (CHF) (HCC)    COPD (chronic obstructive pulmonary  disease) (HCC)    Diabetes mellitus without complication (HCC)    GERD (gastroesophageal reflux disease)    Hyperlipidemia    Hypertension    Pneumonia    Pulmonary fibrosis (HCC)      Vital Signs: BP (!) 128/54   Pulse 93   Temp 97.8 F (36.6 C)   Resp 16   Ht 4\' 5"  (1.346 m)   Wt 158 lb (71.7 kg)   SpO2 97%   BMI 39.55 kg/m    Review of Systems  Constitutional:  Negative for fatigue and fever.  HENT:  Negative for congestion, mouth sores and postnasal drip.   Respiratory:  Negative for cough.   Cardiovascular:  Negative for chest pain.  Genitourinary:  Negative for flank pain.  Skin:  Positive for wound.  Psychiatric/Behavioral: Negative.      Physical  Exam Vitals and nursing note reviewed.  Constitutional:      Appearance: Normal appearance.  HENT:     Head: Normocephalic and atraumatic.  Eyes:     Pupils: Pupils are equal, round, and reactive to light.  Cardiovascular:     Rate and Rhythm: Normal rate and regular rhythm.  Pulmonary:     Effort: Pulmonary effort is normal.     Breath sounds: Normal breath sounds.  Skin:    Findings: Lesion present.     Comments: Small pressure sore along left buttock, no drainage or signs of infection at this time   Neurological:     Mental Status: She is alert.       Assessment/Plan: 1. Pressure injury of skin of left buttock, unspecified injury stage Will keep clean and utilize barrier cream and cushion dressing and will change positions frequently. Will refer to wound clinic especially given pt is also diabetic and is at higher risk for delayed healing. Call if any worsening or signs of infection prior to clinic evaluation - Ambulatory referral to Wound Clinic   General Counseling: [name] verbalizes understanding of the findings of todays visit and agrees with plan of treatment. I have discussed any further diagnostic evaluation that may be needed or ordered today. We also reviewed her medications today. she has been encouraged to call the office with any questions or concerns that should arise related to todays visit.    Counseling:    Orders Placed This Encounter  Procedures   Ambulatory referral to Wound Clinic    No orders of the defined types were placed in this encounter.   Time spent:30 Minutes

## 2023-03-13 ENCOUNTER — Telehealth: Payer: Self-pay | Admitting: Nurse Practitioner

## 2023-03-13 NOTE — Telephone Encounter (Signed)
Wound Care referral sent via Epic to [facility]

## 2023-03-13 NOTE — Telephone Encounter (Signed)
Wound Care appointment 03/26/2023-[name]

## 2023-03-15 ENCOUNTER — Other Ambulatory Visit: Payer: Self-pay

## 2023-03-15 ENCOUNTER — Emergency Department
Admission: EM | Admit: 2023-03-15 | Discharge: 2023-03-15 | Disposition: A | Discharge status: Home / Self Care | Payer: Medicare HMO | Attending: Emergency Medicine | Admitting: Emergency Medicine

## 2023-03-15 ENCOUNTER — Telehealth: Payer: Self-pay

## 2023-03-15 DIAGNOSIS — E1165 Type 2 diabetes mellitus with hyperglycemia: Secondary | ICD-10-CM | POA: Diagnosis not present

## 2023-03-15 DIAGNOSIS — R739 Hyperglycemia, unspecified: Secondary | ICD-10-CM | POA: Diagnosis not present

## 2023-03-15 DIAGNOSIS — E860 Dehydration: Secondary | ICD-10-CM | POA: Diagnosis not present

## 2023-03-15 DIAGNOSIS — J449 Chronic obstructive pulmonary disease, unspecified: Secondary | ICD-10-CM | POA: Diagnosis not present

## 2023-03-15 DIAGNOSIS — I10 Essential (primary) hypertension: Secondary | ICD-10-CM | POA: Insufficient documentation

## 2023-03-15 DIAGNOSIS — R0689 Other abnormalities of breathing: Secondary | ICD-10-CM | POA: Diagnosis not present

## 2023-03-15 DIAGNOSIS — E119 Type 2 diabetes mellitus without complications: Secondary | ICD-10-CM | POA: Diagnosis not present

## 2023-03-15 LAB — COMPREHENSIVE METABOLIC PANEL
ALT: 9 U/L (ref 0–44)
AST: 16 U/L (ref 15–41)
Albumin: 3.1 g/dL — ABNORMAL LOW (ref 3.5–5.0)
Alkaline Phosphatase: 88 U/L (ref 38–126)
Anion gap: 10 (ref 5–15)
BUN: 36 mg/dL — ABNORMAL HIGH (ref 8–23)
CO2: 26 mmol/L (ref 22–32)
Calcium: 8.2 mg/dL — ABNORMAL LOW (ref 8.9–10.3)
Chloride: 90 mmol/L — ABNORMAL LOW (ref 98–111)
Creatinine, Ser: 2.81 mg/dL — ABNORMAL HIGH (ref 0.44–1.00)
GFR, Estimated: 16 mL/min — ABNORMAL LOW (ref >60)
Glucose, Bld: 473 mg/dL — ABNORMAL HIGH (ref 70–99)
Potassium: 3.8 mmol/L (ref 3.5–5.1)
Sodium: 126 mmol/L — ABNORMAL LOW (ref 135–145)
Total Bilirubin: 0.7 mg/dL (ref 0.3–1.2)
Total Protein: 7 g/dL (ref 6.5–8.1)

## 2023-03-15 LAB — CBG MONITORING, ED
Glucose-Capillary: 476 mg/dL — ABNORMAL HIGH (ref 70–99)
Glucose-Capillary: 398 mg/dL — ABNORMAL HIGH (ref 70–99)
Glucose-Capillary: 163 mg/dL — ABNORMAL HIGH (ref 70–99)

## 2023-03-15 LAB — CBC
HCT: 32.4 % — ABNORMAL LOW (ref 36.0–46.0)
Hemoglobin: 10.7 g/dL — ABNORMAL LOW (ref 12.0–15.0)
MCH: 30.1 pg (ref 26.0–34.0)
MCHC: 33 g/dL (ref 30.0–36.0)
MCV: 91 fL (ref 80.0–100.0)
Platelets: 209 10*3/uL (ref 150–400)
RBC: 3.56 MIL/uL — ABNORMAL LOW (ref 3.87–5.11)
RDW: 12.5 % (ref 11.5–15.5)
WBC: 8.6 10*3/uL (ref 4.0–10.5)
nRBC: 0 % (ref 0.0–0.2)

## 2023-03-15 LAB — BLOOD GAS, VENOUS
Acid-Base Excess: 5.3 mmol/L — ABNORMAL HIGH (ref 0.0–2.0)
Bicarbonate: 31.1 mmol/L — ABNORMAL HIGH (ref 20.0–28.0)
O2 Saturation: 68.1 %
Patient temperature: 37
pCO2, Ven: 49 mmHg (ref 44–60)
pH, Ven: 7.41 (ref 7.25–7.43)
pO2, Ven: 43 mmHg (ref 32–45)

## 2023-03-15 MED ORDER — INSULIN ASPART 100 UNIT/ML IJ SOLN
10.0000 [IU] | Freq: Once | INTRAMUSCULAR | Status: AC
  Administered 2023-03-15: 10 [IU] via INTRAVENOUS
  Filled 2023-03-15: qty 1

## 2023-03-15 MED ORDER — SODIUM CHLORIDE 0.9 % IV BOLUS
1000.0000 mL | Freq: Once | INTRAVENOUS | Status: AC
  Administered 2023-03-15: 1000 mL via INTRAVENOUS

## 2023-03-15 NOTE — Telephone Encounter (Signed)
[name], [name], called to report that patient's blood sugar is 496. She is unstable and not able to walk. Per Alyssa, advised patient's daughter to go to the ED or to call 911. [name] states she will take her to the ED now.

## 2023-03-15 NOTE — ED Triage Notes (Signed)
Pt arrives via [facility] from home w/ c/o hyperglycemia today. Pt endorsing weakness for the past few days with a "high" reading on her glucometer. CBG 550 on arrival .  Hx CHF, COPD, CKD  119/65 105 sinus tach

## 2023-03-15 NOTE — ED Provider Notes (Signed)
[facility] Provider Note    Event Date/Time   First MD Initiated Contact with Patient 03/15/23 1718     (approximate)  History   Chief Complaint: Hyperglycemia  HPI  [name] is a 82 y.o. female with a past medical history of anemia, atrial fibrillation, COPD, diabetes, gastric reflux, hypertension, hyperlipidemia, presents to the emergency department for weakness elevated blood sugar.  According to the patient for the past several days she has been feeling somewhat weak in her legs bilaterally.  Describes this more as a generalized weakness.  States she has been watching her blood sugars and they have been slowly increasing to today when it read "high" so the patient came to the emergency department for evaluation.  Patient denies any other symptoms denies any chest pain abdominal pain nausea vomiting diarrhea or dysuria.  Physical Exam   Triage Vital Signs: ED Triage Vitals  Enc Vitals Group     BP --      Pulse Rate 03/15/23 1716 87     Resp 03/15/23 1716 17     Temp 03/15/23 1716 98.3 F (36.8 C)     Temp Source 03/15/23 1716 Oral     SpO2 03/15/23 1716 96 %     Weight 03/15/23 1718 167 lb 8.8 oz (76 kg)     Height 03/15/23 1718 4\' 5"  (1.346 m)     Head Circumference --      Peak Flow --      Pain Score 03/15/23 1718 0     Pain Loc --      Pain Edu? --      Excl. in GC? --     Most recent vital signs: Vitals:   03/15/23 1716  Pulse: 87  Resp: 17  Temp: 98.3 F (36.8 C)  SpO2: 96%    General: Awake, no distress.  CV:  Good peripheral perfusion.  Regular rate and rhythm  Resp:  Normal effort.  Equal breath sounds bilaterally.  Abd:  No distention.  Soft, nontender.  No rebound or guarding.  ED Results / Procedures / Treatments    MEDICATIONS ORDERED IN ED: Medications  sodium chloride 0.9 % bolus 1,000 mL (has no administration in time range)     IMPRESSION / MDM / ASSESSMENT AND PLAN / ED COURSE  I reviewed the triage  vital signs and the nursing notes.  Patient's presentation is most consistent with acute presentation with potential threat to life or bodily function.  Patient presents emergency department for generalized weakness elevated blood sugar reading "high" earlier today.  Patient's blood sugar currently 476 upon arrival to the emergency department.  Patient denies any other symptoms denies any other recent illnesses.  We will check labs, VBG, IV hydrate and continue to closely monitor.  We will also check urinalysis rule out urinary tract infection.  Patient agreeable to plan of care.  Patient's workup shows a reassuring CBC, chemistry shows hyperglycemia 473 with pseudohyponatremia with a reassuringly normal anion gap, chronic renal insufficiency not significantly changed from prior values.  Patient's VBG reassuringly shows a normal pH.  We will dose IV fluids we will dose IV insulin and reassess.  Urinalysis pending.  Patient's blood sugar has come down nicely currently 163.  Vital signs remain reassuring.  Patient states she is feeling much better after IV fluids.  She has been able to ambulate to the restroom.  We will discharge with outpatient follow-up.  Instructed the patient drink plenty of fluids.  FINAL CLINICAL IMPRESSION(S) / ED DIAGNOSES   Hyperglycemia Dehydration  Note:  This document was prepared using Dragon voice recognition software and may include unintentional dictation errors.   Minna Antis, MD 03/15/23 2002

## 2023-03-20 ENCOUNTER — Ambulatory Visit (INDEPENDENT_AMBULATORY_CARE_PROVIDER_SITE_OTHER): Payer: Medicare HMO | Admitting: Nurse Practitioner

## 2023-03-20 ENCOUNTER — Telehealth: Payer: Self-pay

## 2023-03-20 ENCOUNTER — Encounter: Payer: Self-pay | Admitting: Nurse Practitioner

## 2023-03-20 ENCOUNTER — Other Ambulatory Visit: Payer: Self-pay | Admitting: Nurse Practitioner

## 2023-03-20 VITALS — BP 111/62 | HR 100 | Temp 98.3°F | Resp 16 | Ht <= 58 in | Wt 157.0 lb

## 2023-03-20 DIAGNOSIS — L89329 Pressure ulcer of left buttock, unspecified stage: Secondary | ICD-10-CM

## 2023-03-20 DIAGNOSIS — E349 Endocrine disorder, unspecified: Secondary | ICD-10-CM | POA: Diagnosis not present

## 2023-03-20 DIAGNOSIS — R269 Unspecified abnormalities of gait and mobility: Secondary | ICD-10-CM | POA: Diagnosis not present

## 2023-03-20 DIAGNOSIS — E1165 Type 2 diabetes mellitus with hyperglycemia: Secondary | ICD-10-CM

## 2023-03-20 DIAGNOSIS — G63 Polyneuropathy in diseases classified elsewhere: Secondary | ICD-10-CM

## 2023-03-20 DIAGNOSIS — R531 Weakness: Secondary | ICD-10-CM | POA: Diagnosis not present

## 2023-03-20 MED ORDER — DOXYCYCLINE HYCLATE 100 MG PO TABS
100.0000 mg | ORAL_TABLET | Freq: Two times a day (BID) | ORAL | 0 refills | Status: AC
Start: 2023-03-20 — End: 2023-03-30

## 2023-03-20 MED ORDER — MUPIROCIN 2 % EX OINT
1.0000 | TOPICAL_OINTMENT | Freq: Every day | CUTANEOUS | 2 refills | Status: DC
Start: 2023-03-20 — End: 2023-05-23

## 2023-03-20 MED ORDER — LEVEMIR FLEXTOUCH 100 UNIT/ML ~~LOC~~ SOPN
14.0000 [IU] | PEN_INJECTOR | Freq: Every day | SUBCUTANEOUS | 11 refills | Status: DC
Start: 2023-03-20 — End: 2023-03-23

## 2023-03-20 NOTE — Telephone Encounter (Signed)
Please change

## 2023-03-20 NOTE — Progress Notes (Signed)
[facility] [address] [place], [place] [address]  Internal MEDICINE  Office Visit Note  Patient Name: [name]  [id number]  [id number]  Date of Service: 03/20/2023  Chief Complaint  Patient presents with   Diabetes   Gastroesophageal Reflux   Hypertension   Hyperlipidemia   Follow-up    ED f/u Hyperglycemia    HPI [name] presents for a follow-up visit for recent ER visit for hyperglycemia, pressure injury and risk of falls Diabetes -- ED visit -- had sugar of almost 500, treated in ER. Glucose fasting has been as high as 270. Currently taking levemir 10 units daily.  Pressure sore on left buttocks -- has been applying desitin and A&D ointment, need a good barrier cream. Redness and open wound, risk of infection  Generalized weakness and abnormal gait -- increased risk of falls, needs a walker, requesting one with seat and wheels.  Neuropathy -- increases risk of falls due to change in sensation in feet.     Current Medication: Outpatient Encounter Medications as of 03/20/2023  Medication Sig   amLODipine (NORVASC) 5 MG tablet Take 1 tablet by mouth once daily   atorvastatin (LIPITOR) 10 MG tablet TAKE 1 TABLET BY MOUTH ONCE DAILY IN THE EVENING   citalopram (CELEXA) 20 MG tablet Take 1 tablet (20 mg total) by mouth daily.   cyclobenzaprine (FLEXERIL) 5 MG tablet TAKE 1 TABLET BY MOUTH AT BEDTIME   donepezil (ARICEPT) 5 MG tablet TAKE 1 TABLET BY MOUTH IN THE MORNING   doxycycline (VIBRA-TABS) 100 MG tablet Take 1 tablet (100 mg total) by mouth 2 (two) times daily for 10 days. Take with food   ferrous sulfate 325 (65 FE) MG tablet Take 1 tablet (325 mg total) by mouth daily with breakfast.   Insulin Pen Needle (PEN NEEDLES 3/16") 31G X 5 MM MISC 1 applicator by Does not apply route 4 (four) times daily.   linagliptin (TRADJENTA) 5 MG TABS tablet Take 1 tablet (5 mg total) by mouth daily.   LINZESS 145 MCG CAPS capsule Take 1 capsule by mouth at bedtime    melatonin 3 MG TABS tablet Take 3 mg by mouth at bedtime as needed (sleep).   methocarbamol (ROBAXIN) 500 MG tablet Take 1 tablet (500 mg total) by mouth in the morning and at bedtime.   montelukast (SINGULAIR) 10 MG tablet TAKE 1 TABLET BY MOUTH AT BEDTIME   mupirocin ointment (BACTROBAN) 2 % Apply 1 Application topically daily. To wound until healed.   ondansetron (ZOFRAN-ODT) 4 MG disintegrating tablet Take 1 tablet (4 mg total) by mouth every 6 (six) hours as needed for nausea or vomiting.   oxyCODONE-acetaminophen (PERCOCET/ROXICET) 5-325 MG tablet Take 1 tablet by mouth 2 (two) times daily as needed for severe pain.   oxyCODONE-acetaminophen (PERCOCET/ROXICET) 5-325 MG tablet Take 1 tablet by mouth 2 (two) times daily as needed for severe pain.   pantoprazole (PROTONIX) 40 MG tablet Take 1 tablet by mouth twice daily   rOPINIRole (REQUIP) 0.5 MG tablet Take 1 tablet by mouth in the evening   torsemide (DEMADEX) 20 MG tablet Take 2.5 tablets (50 mg total) by mouth daily.   traMADol (ULTRAM) 50 MG tablet Take one tab po bid prn for pain   vitamin B-12 1000 MCG tablet Take 1 tablet (1,000 mcg total) by mouth daily.   warfarin (COUMADIN) 4 MG tablet TAKE 1 TABLET BY MOUTH ON MONDAY, WEDNESDAY AND FRIDAY   [DISCONTINUED] insulin detemir (LEVEMIR FLEXTOUCH) 100 UNIT/ML  FlexPen Inject 10 Units into the skin daily.   insulin detemir (LEVEMIR FLEXTOUCH) 100 UNIT/ML FlexPen Inject 14 Units into the skin daily.   warfarin (COUMADIN) 2 MG tablet Take 1 tab po Tuesday ,Thursday ,Friday ad Saturday and Sunday and then take 4 mg rest of week   No facility-administered encounter medications on file as of 03/20/2023.    Surgical History: Past Surgical History:  Procedure Laterality Date   ABDOMINAL HYSTERECTOMY     APPENDECTOMY     CATARACT EXTRACTION     CHOLECYSTECTOMY     HERNIA REPAIR     right knee replacement     right nephroectomy      Medical History: Past Medical History:  Diagnosis  Date   Anemia    Atrial fibrillation (HCC)    Chronic kidney disease    Congestive heart failure (CHF) (HCC)    COPD (chronic obstructive pulmonary disease) (HCC)    Diabetes mellitus without complication (HCC)    GERD (gastroesophageal reflux disease)    Hyperlipidemia    Hypertension    Pneumonia    Pulmonary fibrosis (HCC)     Family History: Family History  Problem Relation Age of Onset   Diabetes Mother    Breast cancer Mother    Heart disease Father    Diabetes Father    Diabetes Sister    Diabetes Brother    Cancer Brother     Social History   Socioeconomic History   Marital status: Widowed    Spouse name: Not on file   Number of children: Not on file   Years of education: Not on file   Highest education level: Not on file  Occupational History   Occupation: retired  Tobacco Use   Smoking status: Former    Packs/day: 1    Types: Cigarettes    Quit date: 08/19/2022    Years since quitting: 0.5   Smokeless tobacco: Never   Tobacco comments:    1 pack daily---quit 1 month   Vaping Use   Vaping Use: Never used  Substance and Sexual Activity   Alcohol use: No   Drug use: No   Sexual activity: Not Currently  Other Topics Concern   Not on file  Social History Narrative   Lives with daughter   Social Determinants of Health   Financial Resource Strain: Not on file  Food Insecurity: Not on file  Transportation Needs: Not on file  Physical Activity: Not on file  Stress: Not on file  Social Connections: Not on file  Intimate Partner Violence: Not on file      Review of Systems  Constitutional:  Positive for activity change and fatigue.  Respiratory: Negative.  Negative for cough, chest tightness, shortness of breath and wheezing.   Cardiovascular: Negative.  Negative for chest pain and palpitations.  Gastrointestinal: Negative.   Musculoskeletal:  Positive for arthralgias and gait problem.  Neurological:  Positive for weakness.   Psychiatric/Behavioral:  Negative for self-injury, sleep disturbance and suicidal ideas. The patient is not nervous/anxious.     Vital Signs: BP 111/62   Pulse 100   Temp 98.3 F (36.8 C)   Resp 16   Ht 4\' 5"  (1.346 m)   Wt 157 lb (71.2 kg)   SpO2 95%   BMI 39.30 kg/m    Physical Exam Vitals reviewed.  Constitutional:      General: She is not in acute distress.    Appearance: Normal appearance. She is obese. She is not ill-appearing.  HENT:     Head: Normocephalic and atraumatic.  Eyes:     Pupils: Pupils are equal, round, and reactive to light.  Cardiovascular:     Rate and Rhythm: Normal rate and regular rhythm.  Pulmonary:     Effort: Pulmonary effort is normal. No respiratory distress.  Skin:         Comments: Marked site is an open wound stage 2 pressure ulcer.   Neurological:     Mental Status: She is alert and oriented to person, place, and time.     Motor: Weakness present.     Gait: Gait abnormal.  Psychiatric:        Mood and Affect: Mood normal.        Behavior: Behavior normal.        Assessment/Plan: 1. Uncontrolled type 2 diabetes mellitus with hyperglycemia (HCC) Increase levemir dose to 14 units daily. Continue to monitor glucose daily, and titrate back down to 10 units if her glucose improves  - insulin detemir (LEVEMIR FLEXTOUCH) 100 UNIT/ML FlexPen; Inject 14 Units into the skin daily.  Dispense: 15 mL; Refill: 11  2. Pressure injury of skin of left buttock, unspecified injury stage Apply mupirocin to wound daily Has upcoming appt at wound clinic.  Take doxycycline as prescribed for possible skin infection  - mupirocin ointment (BACTROBAN) 2 %; Apply 1 Application topically daily. To wound until healed.  Dispense: 30 g; Refill: 2 - doxycycline (VIBRA-TABS) 100 MG tablet; Take 1 tablet (100 mg total) by mouth 2 (two) times daily for 10 days. Take with food  Dispense: 20 tablet; Refill: 0  3. Generalized weakness Rolling walker with seat  ordered  - For home use only DME 4 wheeled rolling walker with seat ([id number])  4. Abnormality of gait and mobility Rolling walker with seat ordered  - For home use only DME 4 wheeled rolling walker with seat ([id number])  5. Neuropathy associated with endocrine disorder (HCC) Rolling walker with seat ordered  - For home use only DME 4 wheeled rolling walker with seat ([id number])    General Counseling: Janayia verbalizes understanding of the findings of todays visit and agrees with plan of treatment. I have discussed any further diagnostic evaluation that may be needed or ordered today. We also reviewed her medications today. she has been encouraged to call the office with any questions or concerns that should arise related to todays visit.    Orders Placed This Encounter  Procedures   For home use only DME 4 wheeled rolling walker with seat ([id number])    Meds ordered this encounter  Medications   mupirocin ointment (BACTROBAN) 2 %    Sig: Apply 1 Application topically daily. To wound until healed.    Dispense:  30 g    Refill:  2   doxycycline (VIBRA-TABS) 100 MG tablet    Sig: Take 1 tablet (100 mg total) by mouth 2 (two) times daily for 10 days. Take with food    Dispense:  20 tablet    Refill:  0   insulin detemir (LEVEMIR FLEXTOUCH) 100 UNIT/ML FlexPen    Sig: Inject 14 Units into the skin daily.    Dispense:  15 mL    Refill:  11    Return for upcoming medicare wellness in june.   Total time spent:30 Minutes Time spent includes review of chart, medications, test results, and follow up plan with the patient.   [place] Controlled Substance Database was reviewed by me.  This patient was seen  by Sallyanne Kuster, FNP-C in collaboration with Dr. Beverely Risen as a part of collaborative care agreement.   Georgeanna Radziewicz R. Tedd Sias, MSN, FNP-C Internal medicine

## 2023-03-20 NOTE — Telephone Encounter (Signed)
Faxed [facility] for walker  

## 2023-03-21 ENCOUNTER — Telehealth: Payer: Self-pay | Admitting: Nurse Practitioner

## 2023-03-21 NOTE — Telephone Encounter (Signed)
Walker w/ wheel order faxed back to [facility]; [phone]. Scanned-Toni

## 2023-03-23 MED ORDER — INSULIN GLARGINE 100 UNIT/ML SOLOSTAR PEN: 14.0000 [IU] | PEN_INJECTOR | Freq: Every day | SUBCUTANEOUS | 3 refills | Status: DC

## 2023-03-23 NOTE — Telephone Encounter (Signed)
Please change

## 2023-03-26 ENCOUNTER — Encounter: Payer: Medicare HMO | Attending: Physician Assistant | Admitting: Physician Assistant

## 2023-03-26 DIAGNOSIS — I480 Paroxysmal atrial fibrillation: Secondary | ICD-10-CM | POA: Diagnosis not present

## 2023-03-26 DIAGNOSIS — L89322 Pressure ulcer of left buttock, stage 2: Secondary | ICD-10-CM | POA: Insufficient documentation

## 2023-03-26 DIAGNOSIS — I130 Hypertensive heart and chronic kidney disease with heart failure and stage 1 through stage 4 chronic kidney disease, or unspecified chronic kidney disease: Secondary | ICD-10-CM | POA: Insufficient documentation

## 2023-03-26 DIAGNOSIS — Z7901 Long term (current) use of anticoagulants: Secondary | ICD-10-CM | POA: Diagnosis not present

## 2023-03-26 DIAGNOSIS — E1151 Type 2 diabetes mellitus with diabetic peripheral angiopathy without gangrene: Secondary | ICD-10-CM | POA: Insufficient documentation

## 2023-03-26 DIAGNOSIS — F17210 Nicotine dependence, cigarettes, uncomplicated: Secondary | ICD-10-CM | POA: Diagnosis not present

## 2023-03-26 DIAGNOSIS — F17218 Nicotine dependence, cigarettes, with other nicotine-induced disorders: Secondary | ICD-10-CM | POA: Insufficient documentation

## 2023-03-26 DIAGNOSIS — I5042 Chronic combined systolic (congestive) and diastolic (congestive) heart failure: Secondary | ICD-10-CM | POA: Diagnosis not present

## 2023-03-26 DIAGNOSIS — H548 Legal blindness, as defined in USA: Secondary | ICD-10-CM | POA: Diagnosis not present

## 2023-03-26 DIAGNOSIS — N184 Chronic kidney disease, stage 4 (severe): Secondary | ICD-10-CM | POA: Diagnosis not present

## 2023-03-26 DIAGNOSIS — Z794 Long term (current) use of insulin: Secondary | ICD-10-CM | POA: Insufficient documentation

## 2023-03-26 DIAGNOSIS — E11622 Type 2 diabetes mellitus with other skin ulcer: Secondary | ICD-10-CM | POA: Diagnosis not present

## 2023-03-26 DIAGNOSIS — G4730 Sleep apnea, unspecified: Secondary | ICD-10-CM | POA: Diagnosis not present

## 2023-03-26 DIAGNOSIS — J449 Chronic obstructive pulmonary disease, unspecified: Secondary | ICD-10-CM | POA: Insufficient documentation

## 2023-03-26 DIAGNOSIS — E1122 Type 2 diabetes mellitus with diabetic chronic kidney disease: Secondary | ICD-10-CM | POA: Diagnosis not present

## 2023-03-27 DIAGNOSIS — L89322 Pressure ulcer of left buttock, stage 2: Secondary | ICD-10-CM | POA: Diagnosis not present

## 2023-03-27 DIAGNOSIS — E11622 Type 2 diabetes mellitus with other skin ulcer: Secondary | ICD-10-CM | POA: Diagnosis not present

## 2023-03-28 DIAGNOSIS — Z7901 Long term (current) use of anticoagulants: Secondary | ICD-10-CM | POA: Diagnosis not present

## 2023-03-28 NOTE — Progress Notes (Signed)
SHERREL, REDER A ([id number]) [id number] Page 1 of 4 Visit Report for 03/26/2023 Abuse Risk Screen Details Patient Name: Date of Service: [name], [place] A. 03/26/2023 2:00 PM Medical Record Number: [id number] Patient Account Number: [id number] Date of Birth/Sex: Treating RN: 1941/07/30 (82 y.o. [name] Primary Care Kawana Hegel: Sallyanne Kuster Other Clinician: Referring Ruby Logiudice: Treating Cristina Mattern/Extender: Weldon Inches Weeks in Treatment: 0 Abuse Risk Screen Items Answer Electronic Signature(s) Signed: 03/27/2023 4:45:25 PM By: Midge Aver MSN RN CNS WTA Entered By: Midge Aver on 03/26/2023 14:08:43 -------------------------------------------------------------------------------- Activities of Daily Living Details Patient Name: Date of Service: [name], [place] A. 03/26/2023 2:00 PM Medical Record Number: [id number] Patient Account Number: [id number] Date of Birth/Sex: Treating RN: 10/13/1941 (82 y.o. [name] Primary Care Vanity Larsson: Sallyanne Kuster Other Clinician: Referring Neidy Guerrieri: Treating Tiwana Chavis/Extender: Weldon Inches Weeks in Treatment: 0 Activities of Daily Living Items Answer Activities of Daily Living (Please select one for each item) Drive Automobile Not Able T Medications ake Need Assistance Use T elephone Completely Able Care for Appearance Need Assistance Use T oilet Need Assistance Bath / Shower Need Assistance Dress Self Completely Able Feed Self Completely Able Walk Completely Able Get In / Out Bed Need Assistance Housework Not Able Prepare Meals Not Able Handle Money Not Able Shop for Self Not Able Electronic Signature(s) Signed: 03/27/2023 4:45:25 PM By: Midge Aver MSN RN CNS WTA Entered By: Midge Aver on 03/26/2023 14:09:50 -------------------------------------------------------------------------------- Education Screening Details Patient Name: Date of Service: Amy Keel, MA RY A. 03/26/2023 2:00 PM Medical Record Number: [id number] Patient Account Number: [id number] Date of Birth/Sex: Treating RN: [birth year] (82 y.o. [name] Primary Care [name]: Sallyanne Kuster Other Clinician: Referring Parys Elenbaas: Treating Bayyinah Dukeman/Extender: Weldon Inches Weeks in Treatment: 0 Primary Learner Assessed: Patient Learning Preferences/Education Level/Primary Language [name], Amy A ([id number]) [id number] Page 2 of 4 Learning Preference: Explanation, Demonstration Highest Education Level: High School Preferred Language: English Cognitive Barrier Language Barrier: No Translator Needed: No Memory Deficit: No Emotional Barrier: No Cultural/Religious Beliefs Affecting Medical Care: No Physical Barrier Impaired Vision: Yes Legally Blind Impaired Hearing: Yes Decreased Hand dexterity: No Knowledge/Comprehension Knowledge Level: Medium Comprehension Level: Medium Ability to understand written instructions: Medium Ability to understand verbal instructions: Medium Motivation Cooperation: Cooperative Education Importance: Acknowledges Need Interest in Health Problems: Asks Questions Perception: Coherent Willingness to Engage in Self-Management High Activities: Readiness to Engage in Self-Management High Activities: Electronic Signature(s) Signed: 03/27/2023 4:45:25 PM By: Midge Aver MSN RN CNS WTA Entered By: Midge Aver on 03/26/2023 14:10:52 -------------------------------------------------------------------------------- Fall Risk Assessment Details Patient Name: Date of Service: Amy Keel, MA RY A. 03/26/2023 2:00 PM Medical Record Number: [id number] Patient Account Number: [id number] Date of Birth/Sex: Treating RN: [birth year] (82 y.o. [name] Primary Care Yusuf Yu: Sallyanne Kuster Other Clinician: Referring Joshua Zeringue: Treating Emsley Custer/Extender: Weldon Inches Weeks in  Treatment: 0 Fall Risk Assessment Items Have you had 2 or more falls in the last 12 monthso 0 Yes Have you had any fall that resulted in injury in the last 12 monthso 0 Yes FALLS RISK SCREEN History of falling - immediate or within 3 months 0 No Secondary diagnosis (Do you have 2 or more medical diagnoseso) 0 No Ambulatory aid None/bed rest/wheelchair/nurse 0 No Crutches/cane/walker 15 Yes Furniture 0 No Intravenous therapy Access/Saline/Heparin Lock 0 No Gait/Transferring Normal/ bed rest/ wheelchair 0 No Weak (short steps with or without shuffle, stooped but able to lift head while walking, may seek 10 Yes support  from furniture) Impaired (short steps with shuffle, may have difficulty arising from chair, head down, impaired 0 No balance) Mental Status Oriented to own ability 0 Yes Electronic Signature(s) Signed: 03/27/2023 4:45:25 PM By: Midge Aver MSN RN CNS WTA Entered By: Midge Aver on 03/26/2023 14:11:53 Petrakis, Kiyara A ([id number]) [id number] Page 3 of 4 -------------------------------------------------------------------------------- Foot Assessment Details Patient Name: Date of Service: [name], [place] A. 03/26/2023 2:00 PM Medical Record Number: [id number] Patient Account Number: [id number] Date of Birth/Sex: Treating RN: 08-02-1941 (82 y.o. [name] Primary Care Tavi Gaughran: Sallyanne Kuster Other Clinician: Referring Nura Cahoon: Treating Teana Lindahl/Extender: Weldon Inches Weeks in Treatment: 0 Foot Assessment Items Site Locations + = Sensation present, - = Sensation absent, C = Callus, U = Ulcer R = Redness, W = Warmth, M = Maceration, PU = Pre-ulcerative lesion F = Fissure, S = Swelling, D = Dryness Assessment Right: Left: Other Deformity: No No Prior Foot Ulcer: No No Prior Amputation: No No Charcot Joint: No No Ambulatory Status: Ambulatory With Help Assistance Device: Cane Gait: Charity fundraiser) Signed: 03/27/2023 4:45:25 PM By: Midge Aver MSN RN CNS WTA Entered By: Midge Aver on 03/26/2023 14:12:28 -------------------------------------------------------------------------------- Nutrition Risk Screening Details Patient Name: Date of Service: Amy Keel, MA RY A. 03/26/2023 2:00 PM Medical Record Number: [id number] Patient Account Number: [id number] Date of Birth/Sex: Treating RN: [birth year] (82 y.o. [name] Primary Care Shaton Lore: Sallyanne Kuster Other Clinician: Referring Sukhman Martine: Treating Azaan Leask/Extender: Weldon Inches Weeks in Treatment: 0 Height (in): 58 Weight (lbs): 152 Body Mass Index (BMI): 31.8 Nutrition Risk Screening Items KHI, PRESTWICH A ([id number]) [id number] Page 4 of 4 Score Screening NUTRITION RISK SCREEN: I have an illness or condition that made me change the kind and/or amount of food I eat 0 No I eat fewer than two meals per day 0 No I eat few fruits and vegetables, or milk products 0 No I have three or more drinks of beer, liquor or wine almost every day 0 No I have tooth or mouth problems that make it hard for me to eat 0 No I don't always have enough money to buy the food I need 0 No I eat alone most of the time 0 No I take three or more different prescribed or over-the-counter drugs a day 0 No Without wanting to, I have lost or gained 10 pounds in the last six months 0 No I am not always physically able to shop, cook and/or feed myself 0 No Nutrition Protocols Good Risk Protocol 0 No interventions needed Moderate Risk Protocol High Risk Proctocol Risk Level: Good Risk Score: 0 Electronic Signature(s) Signed: 03/27/2023 4:45:25 PM By: Midge Aver MSN RN CNS WTA Entered By: Midge Aver on 03/26/2023 14:12:11

## 2023-03-29 ENCOUNTER — Telehealth: Payer: Self-pay | Admitting: Nurse Practitioner

## 2023-03-29 ENCOUNTER — Ambulatory Visit (INDEPENDENT_AMBULATORY_CARE_PROVIDER_SITE_OTHER): Payer: Medicare HMO

## 2023-03-29 DIAGNOSIS — I4811 Longstanding persistent atrial fibrillation: Secondary | ICD-10-CM

## 2023-03-29 LAB — PROTIME-INR

## 2023-03-29 NOTE — Progress Notes (Signed)
Patient's INR today is 3.1. As per Alyssa, no change. Take Coumadin 2mg  on Tuesday, Wednesday, Thursday, Saturday and Sunday - 4mg  on Monday and Friday.

## 2023-03-29 NOTE — Telephone Encounter (Signed)
Per patient's daughter, June, General surgery referral cancelled. She stated patient's leg is better since she has been on fluid pills-Toni

## 2023-04-03 ENCOUNTER — Encounter: Payer: Medicare HMO | Admitting: Physician Assistant

## 2023-04-03 DIAGNOSIS — N184 Chronic kidney disease, stage 4 (severe): Secondary | ICD-10-CM | POA: Diagnosis not present

## 2023-04-03 DIAGNOSIS — E1122 Type 2 diabetes mellitus with diabetic chronic kidney disease: Secondary | ICD-10-CM | POA: Diagnosis not present

## 2023-04-03 DIAGNOSIS — L89322 Pressure ulcer of left buttock, stage 2: Secondary | ICD-10-CM | POA: Diagnosis not present

## 2023-04-03 DIAGNOSIS — I130 Hypertensive heart and chronic kidney disease with heart failure and stage 1 through stage 4 chronic kidney disease, or unspecified chronic kidney disease: Secondary | ICD-10-CM | POA: Diagnosis not present

## 2023-04-03 DIAGNOSIS — I480 Paroxysmal atrial fibrillation: Secondary | ICD-10-CM | POA: Diagnosis not present

## 2023-04-03 DIAGNOSIS — J449 Chronic obstructive pulmonary disease, unspecified: Secondary | ICD-10-CM | POA: Diagnosis not present

## 2023-04-03 DIAGNOSIS — I5042 Chronic combined systolic (congestive) and diastolic (congestive) heart failure: Secondary | ICD-10-CM | POA: Diagnosis not present

## 2023-04-03 DIAGNOSIS — E11622 Type 2 diabetes mellitus with other skin ulcer: Secondary | ICD-10-CM | POA: Diagnosis not present

## 2023-04-03 DIAGNOSIS — F17218 Nicotine dependence, cigarettes, with other nicotine-induced disorders: Secondary | ICD-10-CM | POA: Diagnosis not present

## 2023-04-03 NOTE — Progress Notes (Signed)
[name], [name] ([id number]) [id number] Page 1 of 7 Visit Report for 04/03/2023 Chief Complaint Document Details Patient Name: Date of Service: [name], [place] WU A. 04/03/2023 2:00 PM Medical Record Number: [id number] Patient Account Number: [id number] Date of Birth/Sex: Treating RN: 04-03-1941 (82 y.o. Female) Angelina Pih Primary Care Provider: Sallyanne Kuster Other Clinician: Referring Provider: Treating Provider/Extender: Weldon Inches Weeks in Treatment: 1 Information Obtained from: Patient Chief Complaint Left gluteal pressure ulcer Electronic Signature(s) Signed: 04/03/2023 2:15:45 PM By: Allen Derry PA-C Entered By: Allen Derry on 04/03/2023 14:15:45 -------------------------------------------------------------------------------- Debridement Details Patient Name: Date of Service: Amy Keel, MA RY A. 04/03/2023 2:00 PM Medical Record Number: [id number] Patient Account Number: [id number] Date of Birth/Sex: Treating RN: 12/06/1940 (82 y.o. Female) Angelina Pih Primary Care Provider: Sallyanne Kuster Other Clinician: Referring Provider: Treating Provider/Extender: Weldon Inches Weeks in Treatment: 1 Debridement Performed for Assessment: Wound #1 Left Gluteus Performed By: Physician Allen Derry, PA-C Debridement Type: Debridement Level of Consciousness (Pre-procedure): Awake and Alert Pre-procedure Verification/Time Out Yes - 14:34 Taken: Pain Control: Lidocaine 4% T opical Solution Percent of Wound Bed Debrided: 100% T Area Debrided (cm): otal 0.2 Tissue and other material debrided: Viable, Non-Viable, Slough, Subcutaneous, Slough Level: Skin/Subcutaneous Tissue Debridement Description: Excisional Instrument: Curette Bleeding: Minimum Hemostasis Achieved: Pressure Response to Treatment: Procedure was tolerated well Level of Consciousness (Post- Awake and Alert procedure): Post Debridement Measurements of  Total Wound Length: (cm) 0.5 Stage: Category/Stage II Width: (cm) 0.5 Depth: (cm) 0.1 Volume: (cm) 0.02 Character of Wound/Ulcer Post Debridement: Stable Post Procedure Diagnosis Same as Pre-procedure Electronic Signature(s) Signed: 04/03/2023 4:39:57 PM By: Angelina Pih Signed: 04/03/2023 6:26:43 PM By: Allen Derry PA-C Entered By: Angelina Pih on 04/03/2023 14:34:37 [place], Sontee A ([id number]) [id number] Page 2 of 7 -------------------------------------------------------------------------------- HPI Details Patient Name: Date of Service: [name], [place] RY A. 04/03/2023 2:00 PM Medical Record Number: [id number] Patient Account Number: [id number] Date of Birth/Sex: Treating RN: [birth year] (82 y.o. Female) Angelina Pih Primary Care Provider: Sallyanne Kuster Other Clinician: Referring Provider: Treating Provider/Extender: Weldon Inches Weeks in Treatment: 1 History of Present Illness HPI Description: 03-26-2023 upon evaluation today patient presents for initial evaluation here in the clinic concerning a wound over the left gluteal region this is a stage II pressure ulcer. Fortunately it does not appear to be too deep at all which is great news and very pleased in that regard. The patient is on Coumadin she also has been using mupirocin topically and has doxycycline of which she has 1 week left. The good news is I think the infection looks to be doing much better. With that being said she does spend most of her time in a recliner and I think that is where a lot of the pressure issues coming from. Patient does have a history of diabetes mellitus type 2, she uses insulin for control, hypertension, COPD, chronic kidney disease stage IV, congestive heart failure, long-term use of anticoagulant therapy due to atrial fibrillation, this specifically his Coumadin, she is legally blind, and is a current smoker. 04-03-2023 upon evaluation today  patient appears to be doing well currently in regard to her wound. This is actually showing signs of excellent improvement. Fortunately there does not appear to be any signs of infection locally nor systemically at this time. With that being said the patient does seem to be making some good progress here in regard to left gluteal ulcer. She does have a rash on the right gluteal region. This is something  that tends to come and go about once a year. This has been going on for a number of years. Electronic Signature(s) Signed: 04/03/2023 2:43:37 PM By: Allen Derry PA-C Entered By: Allen Derry on 04/03/2023 14:43:37 -------------------------------------------------------------------------------- Physical Exam Details Patient Name: Date of Service: Amy Keel, MA RY A. 04/03/2023 2:00 PM Medical Record Number: [id number] Patient Account Number: [id number] Date of Birth/Sex: Treating RN: 12-30-1940 (82 y.o. Female) Angelina Pih Primary Care Provider: Sallyanne Kuster Other Clinician: Referring Provider: Treating Provider/Extender: Weldon Inches Weeks in Treatment: 1 Constitutional Well-nourished and well-hydrated in no acute distress. Respiratory normal breathing without difficulty. Psychiatric this patient is able to make decisions and demonstrates good insight into disease process. Alert and Oriented x 3. pleasant and cooperative. Notes Upon inspection patient's wound bed actually showed signs of doing well I did perform some debridement clearway some of the necrotic debris she tolerated this today without complication postdebridement wound bed appears to be doing much better. In regard to the right gluteal region this rash actually started out as an area that was itching and then now has progressed more to just hurting and it appears that she had small blisters that have ruptured. I think this is representative of shingles. It is always on this side, in the same spot, and  always starts out the same way. T ends to be self-limited which is good news. With that being said she is having quite a bit of discomfort with this I think potentially acyclovir could be beneficial for her. Electronic Signature(s) Signed: 04/03/2023 2:45:19 PM By: Allen Derry PA-C Entered By: Allen Derry on 04/03/2023 14:45:18 -------------------------------------------------------------------------------- Physician Orders Details Patient Name: Date of Service: Amy Keel, MA RY A. 04/03/2023 2:00 PM Medical Record Number: [id number] Patient Account Number: [id number] Date of Birth/Sex: Treating RN: 22-Nov-1940 (82 y.o. Female) Angelina Pih Primary Care Provider: Sallyanne Kuster Other Clinician: Referring Provider: Treating Provider/Extender: Weldon Inches Weeks in Treatment: 1 Verbal / Phone Orders: No LEANIE, WUETHRICH A ([id number]) [id number] Page 3 of 7 Diagnosis Coding ICD-10 Coding Code Description [phone] Pressure ulcer of left buttock, stage 2 E11.622 Type 2 diabetes mellitus with other skin ulcer Z79.4 Long term (current) use of insulin I10 Essential (primary) hypertension J44.9 Chronic obstructive pulmonary disease, unspecified N18.4 Chronic kidney disease, stage 4 (severe) I50.42 Chronic combined systolic (congestive) and diastolic (congestive) heart failure Z79.01 Long term (current) use of anticoagulants I48.0 Paroxysmal atrial fibrillation H54.8 Legal blindness, as defined in [place] F17.218 Nicotine dependence, cigarettes, with other nicotine-induced disorders Follow-up Appointments Return Appointment in 1 week. Bathing/ [employer] wounds with antibacterial soap and water. Medications-Please add to medication list. ntibiotics - please pick up and start medication being prescribed and ointment for right glute issue P.O. A ntibiotic - please pick up and start medication being prescribed and ointment for right glute  issue. You may apply ointment every other day Topical A and keep covered. Wound Treatment Wound #1 - Gluteus Wound Laterality: Left Cleanser: Soap and Water Every Other Day/30 Days Discharge Instructions: Gently cleanse wound with antibacterial soap, rinse and pat dry prior to dressing wounds Prim Dressing: Xeroform-HBD 2x2 (in/in) (Generic) Every Other Day/30 Days ary Discharge Instructions: Apply Xeroform-HBD 2x2 (in/in) as directed Secondary Dressing: (BORDER) Zetuvit Plus SILICONE BORDER Dressing 4x4 (in/in) (Generic) Every Other Day/30 Days Discharge Instructions: Please do not put silicone bordered dressings under wraps. Use non-bordered dressing only. Patient Medications llergies: No Known Drug Allergies A Notifications Medication Indication Start End 04/03/2023 acyclovir DOSE 1 - oral  800 mg tablet - 1 tablet oral taken 5 times per day for 7 days Electronic Signature(s) Signed: 04/03/2023 3:08:54 PM By: Allen Derry PA-C Entered By: Allen Derry on 04/03/2023 15:08:54 -------------------------------------------------------------------------------- Problem List Details Patient Name: Date of Service: Amy Keel, MA RY A. 04/03/2023 2:00 PM Medical Record Number: [id number] Patient Account Number: [id number] Date of Birth/Sex: Treating RN: 1941-05-09 (82 y.o. Female) Angelina Pih Primary Care Provider: Sallyanne Kuster Other Clinician: Referring Provider: Treating Provider/Extender: Weldon Inches Weeks in Treatment: 1 Active Problems ICD-10 Encounter Code Description Active Date MDM Diagnosis [phone] Pressure ulcer of left buttock, stage 2 03/26/2023 No Yes BRITYN, MASTROGIOVANNI A ([id number]) [id number] Page 4 of 7 E11.622 Type 2 diabetes mellitus with other skin ulcer 03/26/2023 No Yes Z79.4 Long term (current) use of insulin 03/26/2023 No Yes B02.9 Zoster without complications 04/03/2023 No Yes I10 Essential (primary) hypertension 03/26/2023  No Yes J44.9 Chronic obstructive pulmonary disease, unspecified 03/26/2023 No Yes N18.4 Chronic kidney disease, stage 4 (severe) 03/26/2023 No Yes I50.42 Chronic combined systolic (congestive) and diastolic (congestive) heart failure 03/26/2023 No Yes Z79.01 Long term (current) use of anticoagulants 03/26/2023 No Yes I48.0 Paroxysmal atrial fibrillation 03/26/2023 No Yes H54.8 Legal blindness, as defined in [place] 03/26/2023 No Yes F17.218 Nicotine dependence, cigarettes, with other nicotine-induced disorders 03/26/2023 No Yes Inactive Problems Resolved Problems Electronic Signature(s) Signed: 04/03/2023 2:47:49 PM By: Allen Derry PA-C Previous Signature: 04/03/2023 2:15:36 PM Version By: Allen Derry PA-C Entered By: Allen Derry on 04/03/2023 14:47:48 -------------------------------------------------------------------------------- Progress Note Details Patient Name: Date of Service: Amy Keel, MA RY A. 04/03/2023 2:00 PM Medical Record Number: [id number] Patient Account Number: [id number] Date of Birth/Sex: Treating RN: August 02, 1941 (82 y.o. Female) Angelina Pih Primary Care Provider: Sallyanne Kuster Other Clinician: Referring Provider: Treating Provider/Extender: Weldon Inches Weeks in Treatment: 1 Subjective Chief Complaint Information obtained from Patient Left gluteal pressure ulcer CHIYOKO, TORRICO A ([id number]) [id number] Page 5 of 7 History of Present Illness (HPI) 03-26-2023 upon evaluation today patient presents for initial evaluation here in the clinic concerning a wound over the left gluteal region this is a stage II pressure ulcer. Fortunately it does not appear to be too deep at all which is great news and very pleased in that regard. The patient is on Coumadin she also has been using mupirocin topically and has doxycycline of which she has 1 week left. The good news is I think the infection looks to be doing much better. With that being said she  does spend most of her time in a recliner and I think that is where a lot of the pressure issues coming from. Patient does have a history of diabetes mellitus type 2, she uses insulin for control, hypertension, COPD, chronic kidney disease stage IV, congestive heart failure, long-term use of anticoagulant therapy due to atrial fibrillation, this specifically his Coumadin, she is legally blind, and is a current smoker. 04-03-2023 upon evaluation today patient appears to be doing well currently in regard to her wound. This is actually showing signs of excellent improvement. Fortunately there does not appear to be any signs of infection locally nor systemically at this time. With that being said the patient does seem to be making some good progress here in regard to left gluteal ulcer. She does have a rash on the right gluteal region. This is something that tends to come and go about once a year. This has been going on for a number of years. Objective Constitutional Well-nourished and well-hydrated in no acute distress.  Vitals Time Taken: 2:16 PM, Height: 58 in, Weight: 152 lbs, BMI: 31.8, Temperature: 97.8 F, Pulse: 86 bpm, Respiratory Rate: 18 breaths/min, Blood Pressure: 117/74 mmHg. Respiratory normal breathing without difficulty. Psychiatric this patient is able to make decisions and demonstrates good insight into disease process. Alert and Oriented x 3. pleasant and cooperative. General Notes: Upon inspection patient's wound bed actually showed signs of doing well I did perform some debridement clearway some of the necrotic debris she tolerated this today without complication postdebridement wound bed appears to be doing much better. In regard to the right gluteal region this rash actually started out as an area that was itching and then now has progressed more to just hurting and it appears that she had small blisters that have ruptured. I think this is representative of shingles. It is always  on this side, in the same spot, and always starts out the same way. T ends to be self-limited which is good news. With that being said she is having quite a bit of discomfort with this I think potentially acyclovir could be beneficial for her. Integumentary (Hair, Skin) Wound #1 status is Open. Original cause of wound was Pressure Injury. The date acquired was: 02/23/2023. The wound has been in treatment 1 weeks. The wound is located on the Left Gluteus. The wound measures 0.5cm length x 0.5cm width x 0.1cm depth; 0.196cm^2 area and 0.02cm^3 volume. There is Fat Layer (Subcutaneous Tissue) exposed. There is no tunneling or undermining noted. There is a medium amount of serosanguineous drainage noted. There is small (1-33%) pink granulation within the wound bed. There is a large (67-100%) amount of necrotic tissue within the wound bed including Adherent Slough. Assessment Active Problems ICD-10 Pressure ulcer of left buttock, stage 2 Type 2 diabetes mellitus with other skin ulcer Long term (current) use of insulin Zoster without complications Essential (primary) hypertension Chronic obstructive pulmonary disease, unspecified Chronic kidney disease, stage 4 (severe) Chronic combined systolic (congestive) and diastolic (congestive) heart failure Long term (current) use of anticoagulants Paroxysmal atrial fibrillation Legal blindness, as defined in [place] Nicotine dependence, cigarettes, with other nicotine-induced disorders Procedures Wound #1 Pre-procedure diagnosis of Wound #1 is a Pressure Ulcer located on the Left Gluteus . There was a Excisional Skin/Subcutaneous Tissue Debridement with a total area of 0.2 sq cm performed by Allen Derry, PA-C. With the following instrument(s): Curette to remove Viable and Non-Viable tissue/material. Material removed includes Subcutaneous Tissue and Slough and after achieving pain control using Lidocaine 4% T opical Solution. No specimens were taken. A time  out was conducted at 14:34, prior to the start of the procedure. A Minimum amount of bleeding was controlled with Pressure. The procedure was tolerated well. Post Debridement Measurements: 0.5cm length x 0.5cm width x 0.1cm depth; 0.02cm^3 volume. Post debridement Stage noted as Category/Stage II. Character of Wound/Ulcer Post Debridement is stable. Post procedure Diagnosis Wound #1: Same as Pre-Procedure MITSUYE, RIDL A ([id number]) [id number] Page 6 of 7 Plan Follow-up Appointments: Return Appointment in 1 week. Bathing/ Shower/ Hygiene: Wash wounds with antibacterial soap and water. Medications-Please add to medication list.: P.O. Antibiotics - please pick up and start medication being prescribed and ointment for right glute issue Topical Antibiotic - please pick up and start medication being prescribed and ointment for right glute issue. You may apply ointment every other day and keep covered. The following medication(s) was prescribed: acyclovir oral 800 mg tablet 1 1 tablet oral taken 5 times per day for 7 days starting 04/03/2023 WOUND #1: -  Gluteus Wound Laterality: Left Cleanser: Soap and Water Every Other Day/30 Days Discharge Instructions: Gently cleanse wound with antibacterial soap, rinse and pat dry prior to dressing wounds Prim Dressing: Xeroform-HBD 2x2 (in/in) (Generic) Every Other Day/30 Days ary Discharge Instructions: Apply Xeroform-HBD 2x2 (in/in) as directed Secondary Dressing: (BORDER) Zetuvit Plus SILICONE BORDER Dressing 4x4 (in/in) (Generic) Every Other Day/30 Days Discharge Instructions: Please do not put silicone bordered dressings under wraps. Use non-bordered dressing only. 1. Based on what I am seeing I am going to recommend that we have the patient continue to monitor for any evidence of infection or worsening. I am going to go ahead and continue with the wound care measures as before. This includes the use of for the left gluteal  region the Xeroform gauze followed by the bordered foam which I think is doing quite well. 2. I am then recommend as well the patient should continue with the Zetuvit to cover. 3 and also can recommend that the patient should continue to monitor for any signs of infection or worsening. Obviously if anything changes she can contact the office and let me know. 4. In regard to the right gluteal rash this actually appears to be shingles and I do believe she would benefit from acyclovir. I discussed this with the patient today and she is in agreement with starting acyclovir and then going get that sent in to the pharmacy for her. We will see patient back for reevaluation in 1 week here in the clinic. If anything worsens or changes patient will contact our office for additional recommendations. Electronic Signature(s) Signed: 04/03/2023 3:09:06 PM By: Allen Derry PA-C Entered By: Allen Derry on 04/03/2023 15:09:06 -------------------------------------------------------------------------------- SuperBill Details Patient Name: Date of Service: Amy Keel, MA RY A. 04/03/2023 Medical Record Number: [id number] Patient Account Number: [id number] Date of Birth/Sex: Treating RN: 05/22/1941 (82 y.o. Female) Angelina Pih Primary Care Provider: Sallyanne Kuster Other Clinician: Referring Provider: Treating Provider/Extender: Weldon Inches Weeks in Treatment: 1 Diagnosis Coding ICD-10 Codes Code Description [phone] Pressure ulcer of left buttock, stage 2 E11.622 Type 2 diabetes mellitus with other skin ulcer Z79.4 Long term (current) use of insulin B02.9 Zoster without complications I10 Essential (primary) hypertension J44.9 Chronic obstructive pulmonary disease, unspecified N18.4 Chronic kidney disease, stage 4 (severe) I50.42 Chronic combined systolic (congestive) and diastolic (congestive) heart failure Z79.01 Long term (current) use of anticoagulants I48.0 Paroxysmal atrial  fibrillation H54.8 Legal blindness, as defined in [place] F17.218 Nicotine dependence, cigarettes, with other nicotine-induced disorders Facility Procedures : ANIQA, MAURY Code: [id number] Zivah A ([id number]) IC L Description: 11042 - DEB SUBQ TISSUE 20 SQ CM/< ICD-10 Diagnosis Description [id number] Diagnosis Description 89.322 Pressure ulcer of left buttock, stage 2 Modifier: [id number] Page 7 of 7 Quantity: 1 Physician Procedures : CPT4 Code Description Modifier [id number] 99214 - WC PHYS LEVEL 4 - EST PT 25 ICD-10 Diagnosis Description L89.322 Pressure ulcer of left buttock, stage 2 E11.622 Type 2 diabetes mellitus with other skin ulcer Z79.4 Long term (current) use of insulin  B02.9 Zoster without complications Quantity: 1 : [id number] 11042 - WC PHYS SUBQ TISS 20 SQ CM ICD-10 Diagnosis Description L89.322 Pressure ulcer of left buttock, stage 2 Quantity: 1 Electronic Signature(s) Signed: 04/03/2023 3:09:33 PM By: Allen Derry PA-C Entered By: Allen Derry on 04/03/2023 15:09:33

## 2023-04-03 NOTE — Progress Notes (Signed)
ZULEIMA, CHATFIELD A ([id number]) [id number] Page 1 of 8 Visit Report for 04/03/2023 Arrival Information Details Patient Name: Date of Service: Doyne Keel, [place] A. 04/03/2023 2:00 PM Medical Record Number: [id number] Patient Account Number: [id number] Date of Birth/Sex: Treating RN: 03-Sep-1941 (82 y.o. Female) Angelina Pih Primary Care Shatarra Wehling: Sallyanne Kuster Other Clinician: Referring Rosaline Ezekiel: Treating Nayelly Laughman/Extender: Weldon Inches Weeks in Treatment: 1 Visit Information History Since Last Visit Added or deleted any medications: No Patient Arrived: [place] Mor Any new allergies or adverse reactions: No Arrival Time: 14:16 Had a fall or experienced change in No Accompanied By: daughter activities of daily living that may affect Transfer Assistance: EasyPivot Patient Lift risk of falls: Patient Identification Verified: Yes Hospitalized since last visit: No Secondary Verification Process Completed: Yes Has Dressing in Place as Prescribed: Yes Patient Requires Transmission-Based Precautions: No Pain Present Now: Yes Patient Has Alerts: Yes Patient Alerts: Patient on Blood Thinner Diabetes type 2 Coumadin Electronic Signature(s) Signed: 04/03/2023 4:39:57 PM By: Angelina Pih Entered By: Angelina Pih on 04/03/2023 14:16:49 -------------------------------------------------------------------------------- Clinic Level of Care Assessment Details Patient Name: Date of Service: Doyne Keel, [place] A. 04/03/2023 2:00 PM Medical Record Number: [id number] Patient Account Number: [id number] Date of Birth/Sex: Treating RN: [birth year] (82 y.o. Female) Angelina Pih Primary Care Cletus Mehlhoff: Sallyanne Kuster Other Clinician: Referring Rhonna Holster: Treating Vinessa Macconnell/Extender: Weldon Inches Weeks in Treatment: 1 Clinic Level of Care Assessment Items TOOL 1 Quantity Score []  - 0 Use when EandM and Procedure is performed on INITIAL  visit ASSESSMENTS - Nursing Assessment / Reassessment []  - 0 General Physical Exam (combine w/ comprehensive assessment (listed just below) when performed on new pt. evals) []  - 0 Comprehensive Assessment (HX, ROS, Risk Assessments, Wounds Hx, etc.) ASSESSMENTS - Wound and Skin Assessment / Reassessment []  - 0 Dermatologic / Skin Assessment (not related to wound area) ASSESSMENTS - Ostomy and/or Continence Assessment and Care []  - 0 Incontinence Assessment and Management []  - 0 Ostomy Care Assessment and Management (repouching, etc.) PROCESS - Coordination of Care []  - 0 Simple Patient / Family Education for ongoing care []  - 0 Complex (extensive) Patient / Family Education for ongoing care []  - 0 Staff obtains Chiropractor, Records, T Results / Process Orders est []  - 0 Staff telephones HHA, Nursing Homes / Clarify orders / etc []  - 0 Routine Transfer to another Facility (non-emergent condition) []  - 0 Routine Hospital Admission (non-emergent condition) []  - 0 New Admissions / Insurance Authorizations / Ordering NPWT Apligraf, etc. , []  - 0 Emergency Hospital Admission (emergent condition) YARIMA, JANDT A ([id number]) [id number] Page 2 of 8 PROCESS - Special Needs []  - 0 Pediatric / Minor Patient Management []  - 0 Isolation Patient Management []  - 0 Hearing / Language / Visual special needs []  - 0 Assessment of Community assistance (transportation, D/C planning, etc.) []  - 0 Additional assistance / Altered mentation []  - 0 Support Surface(s) Assessment (bed, cushion, seat, etc.) INTERVENTIONS - Miscellaneous []  - 0 External ear exam []  - 0 Patient Transfer (multiple staff / Nurse, adult / Similar devices) []  - 0 Simple Staple / Suture removal (25 or less) []  - 0 Complex Staple / Suture removal (26 or more) []  - 0 Hypo/Hyperglycemic Management (do not check if billed separately) []  - 0 Ankle / Brachial Index (ABI) - do not check if billed  separately Has the patient been seen at the hospital within the last three years: Yes Total Score: 0 Level Of Care: ____ Electronic Signature(s) Signed: 04/03/2023 4:39:57 PM  By: Angelina Pih Entered By: Angelina Pih on 04/03/2023 14:47:10 -------------------------------------------------------------------------------- Encounter Discharge Information Details Patient Name: Date of Service: Doyne Keel, [place] RY A. 04/03/2023 2:00 PM Medical Record Number: [id number] Patient Account Number: [id number] Date of Birth/Sex: Treating RN: 1940/11/24 (82 y.o. Female) Angelina Pih Primary Care Artha Stavros: Sallyanne Kuster Other Clinician: Referring Dade Rodin: Treating Mackynzie Woolford/Extender: Weldon Inches Weeks in Treatment: 1 Encounter Discharge Information Items Post Procedure Vitals Discharge Condition: Stable Temperature (F): 97.8 Ambulatory Status: Cane Pulse (bpm): 86 Discharge Destination: Home Respiratory Rate (breaths/min): 18 Transportation: Private Auto Blood Pressure (mmHg): 117/74 Accompanied By: daughter Schedule Follow-up Appointment: Yes Clinical Summary of Care: Electronic Signature(s) Signed: 04/03/2023 4:39:57 PM By: Angelina Pih Entered By: Angelina Pih on 04/03/2023 14:48:15 -------------------------------------------------------------------------------- Lower Extremity Assessment Details Patient Name: Date of Service: Doyne Keel, [place] RY A. 04/03/2023 2:00 PM Medical Record Number: [id number] Patient Account Number: [id number] Date of Birth/Sex: Treating RN: 1941-07-21 (82 y.o. Female) Angelina Pih Primary Care Kamaiyah Uselton: Sallyanne Kuster Other Clinician: Referring Kannen Moxey: Treating Synia Douglass/Extender: Weldon Inches Weeks in Treatment: 1 Electronic Signature(s) Signed: 04/03/2023 4:39:57 PM By: Angelina Pih Entered By: Angelina Pih on 04/03/2023 14:24:57 [name] A ([id number]) [id number] Page 3  of 8 -------------------------------------------------------------------------------- Multi Wound Chart Details Patient Name: Date of Service: Doyne Keel, [place] ZD A. 04/03/2023 2:00 PM Medical Record Number: [id number] Patient Account Number: [id number] Date of Birth/Sex: Treating RN: Mar 16, 1941 (82 y.o. Female) Angelina Pih Primary Care Kagan Mutchler: Sallyanne Kuster Other Clinician: Referring Sherril Shipman: Treating Rorey Bisson/Extender: Weldon Inches Weeks in Treatment: 1 Vital Signs Height(in): 58 Pulse(bpm): 86 Weight(lbs): 152 Blood Pressure(mmHg): 117/74 Body Mass Index(BMI): 31.8 Temperature(F): 97.8 Respiratory Rate(breaths/min): 18 [1:Photos:] [N/A:N/A] Left Gluteus N/A N/A Wound Location: Pressure Injury N/A N/A Wounding Event: Pressure Ulcer N/A N/A Primary Etiology: Chronic Obstructive Pulmonary N/A N/A Comorbid History: Disease (COPD), Sleep Apnea, Congestive Heart Failure, Hypertension, Type II Diabetes 02/23/2023 N/A N/A Date Acquired: 1 N/A N/A Weeks of Treatment: Open N/A N/A Wound Status: No N/A N/A Wound Recurrence: 0.5x0.5x0.1 N/A N/A Measurements L x W x D (cm) 0.196 N/A N/A A (cm) : rea 0.02 N/A N/A Volume (cm) : 40.60% N/A N/A % Reduction in A rea: 39.40% N/A N/A % Reduction in Volume: Category/Stage II N/A N/A Classification: Medium N/A N/A Exudate A mount: Serosanguineous N/A N/A Exudate Type: red, brown N/A N/A Exudate Color: Small (1-33%) N/A N/A Granulation A mount: Pink N/A N/A Granulation Quality: Large (67-100%) N/A N/A Necrotic A mount: Fat Layer (Subcutaneous Tissue): Yes N/A N/A Exposed Structures: Fascia: No Tendon: No Muscle: No Joint: No Bone: No Medium (34-66%) N/A N/A Epithelialization: Treatment Notes Electronic Signature(s) Signed: 04/03/2023 4:39:57 PM By: Angelina Pih Entered By: Angelina Pih on 04/03/2023  14:32:12 -------------------------------------------------------------------------------- Multi-Disciplinary Care Plan Details Patient Name: Date of Service: Doyne Keel, [place] RY A. 04/03/2023 2:00 PM Medical Record Number: [id number] Patient Account Number: [id number] Date of Birth/Sex: Treating RN: August 21, 1941 (82 y.o. Female) Angelina Pih Primary Care Myndi Wamble: Sallyanne Kuster Other Clinician: Referring Brandye Inthavong: Treating Anecia Nusbaum/Extender: Weldon Inches [place], Verdel A ([id number]) [id number] Page 4 of 8 Weeks in Treatment: 1 Active Inactive Abuse / Safety / Falls / Self Care Management Nursing Diagnoses: Impaired physical mobility Potential for falls Self care deficit: actual or potential Goals: Patient will remain injury free Date Initiated: 03/26/2023 Target Resolution Date: 04/25/2023 Goal Status: Active Patient/caregiver will verbalize understanding of skin care regimen Date Initiated: 03/26/2023 Target Resolution Date: 04/25/2023 Goal Status: Active Patient/caregiver will verbalize/demonstrate measure taken to improve self care Date Initiated:  03/26/2023 Target Resolution Date: 04/25/2023 Goal Status: Active Patient/caregiver will verbalize/demonstrate measures taken to prevent injury and/or falls Date Initiated: 03/26/2023 Target Resolution Date: 04/25/2023 Goal Status: Active Interventions: Assess fall risk on admission and as needed Assess: immobility, friction, shearing, incontinence upon admission and as needed Assess impairment of mobility on admission and as needed per policy Assess self care needs on admission and as needed Notes: Nutrition Nursing Diagnoses: Impaired glucose control: actual or potential Goals: Patient/caregiver agrees to and verbalizes understanding of need to use nutritional supplements and/or vitamins as prescribed Date Initiated: 03/26/2023 Target Resolution Date: 04/25/2023 Goal Status:  Active Patient/caregiver verbalizes understanding of need to maintain therapeutic glucose control per primary care physician Date Initiated: 03/26/2023 Target Resolution Date: 04/25/2023 Goal Status: Active Patient/caregiver will maintain therapeutic glucose control Date Initiated: 03/26/2023 Target Resolution Date: 04/25/2023 Goal Status: Active Interventions: Provide education on elevated blood sugars and impact on wound healing Notes: Orientation to the Wound Care Program Nursing Diagnoses: Knowledge deficit related to the wound healing center program Goals: Patient/caregiver will verbalize understanding of the Wound Healing Center Program Date Initiated: 03/26/2023 Target Resolution Date: 04/09/2023 Goal Status: Active Interventions: Provide education on orientation to the wound center Notes: Pressure Nursing Diagnoses: Knowledge deficit related to causes and risk factors for pressure ulcer development Knowledge deficit related to management of pressures ulcers Potential for impaired tissue integrity related to pressure, friction, moisture, and shear JACE, DOWE A ([id number]) [id number] Page 5 of 8 Goals: Patient will remain free from development of additional pressure ulcers Date Initiated: 03/26/2023 Target Resolution Date: 04/25/2023 Goal Status: Active Patient will remain free of pressure ulcers Date Initiated: 03/26/2023 Target Resolution Date: 04/25/2023 Goal Status: Active Patient/caregiver will verbalize risk factors for pressure ulcer development Date Initiated: 03/26/2023 Target Resolution Date: 04/25/2023 Goal Status: Active Patient/caregiver will verbalize understanding of pressure ulcer management Date Initiated: 03/26/2023 Target Resolution Date: 04/25/2023 Goal Status: Active Interventions: Assess: immobility, friction, shearing, incontinence upon admission and as needed Assess offloading mechanisms upon admission and as needed Assess potential  for pressure ulcer upon admission and as needed Provide education on pressure ulcers Notes: Wound/Skin Impairment Nursing Diagnoses: Impaired tissue integrity Knowledge deficit related to smoking impact on wound healing Knowledge deficit related to ulceration/compromised skin integrity Goals: Patient will demonstrate a reduced rate of smoking or cessation of smoking Date Initiated: 03/26/2023 Target Resolution Date: 04/26/2023 Goal Status: Active Ulcer/skin breakdown will have a volume reduction of 30% by week 4 Date Initiated: 03/26/2023 Target Resolution Date: 04/25/2023 Goal Status: Active Ulcer/skin breakdown will have a volume reduction of 50% by week 8 Date Initiated: 03/26/2023 Target Resolution Date: 05/26/2023 Goal Status: Active Ulcer/skin breakdown will have a volume reduction of 80% by week 12 Date Initiated: 03/26/2023 Target Resolution Date: 06/26/2023 Goal Status: Active Interventions: Assess patient/caregiver ability to obtain necessary supplies Assess patient/caregiver ability to perform ulcer/skin care regimen upon admission and as needed Assess ulceration(s) every visit Provide education on smoking Provide education on ulcer and skin care Treatment Activities: Skin care regimen initiated : 03/26/2023 Notes: Electronic Signature(s) Signed: 04/03/2023 4:39:57 PM By: Angelina Pih Entered By: Angelina Pih on 04/03/2023 14:47:30 -------------------------------------------------------------------------------- Pain Assessment Details Patient Name: Date of Service: Doyne Keel, [place] RY A. 04/03/2023 2:00 PM Medical Record Number: [id number] Patient Account Number: [id number] Date of Birth/Sex: Treating RN: 06/06/1941 (82 y.o. Female) Angelina Pih Primary Care [place] Chelf: Sallyanne Kuster Other Clinician: Referring Roben Schliep: Treating Noorah Giammona/Extender: Weldon Inches Weeks in Treatment: 1 Active Problems Location of Pain Severity and Description of  Pain  AYLINNE, TURCZYN A ([id number]) [id number] Page 6 of 8 Patient Has Paino Yes Site Locations Pain Management and Medication Current Pain Management: Notes pt states burning pain at wound site Electronic Signature(s) Signed: 04/03/2023 4:39:57 PM By: Angelina Pih Entered By: Angelina Pih on 04/03/2023 14:17:17 -------------------------------------------------------------------------------- Patient/Caregiver Education Details Patient Name: Date of Service: Doyne Keel, Jaquita Rector A. 6/11/2024andnbsp2:00 PM Medical Record Number: [id number] Patient Account Number: [id number] Date of Birth/Gender: Treating RN: 08-08-1941 (82 y.o. Female) Angelina Pih Primary Care Physician: Sallyanne Kuster Other Clinician: Referring Physician: Treating Physician/Extender: Weldon Inches Weeks in Treatment: 1 Education Assessment Education Provided To: Patient Education Topics Provided Wound Debridement: Handouts: Wound Debridement Methods: Explain/Verbal Responses: State content correctly Wound/Skin Impairment: Handouts: Caring for Your Ulcer Methods: Explain/Verbal Responses: State content correctly Electronic Signature(s) Signed: 04/03/2023 4:39:57 PM By: Angelina Pih Entered By: Angelina Pih on 04/03/2023 14:47:42 -------------------------------------------------------------------------------- Wound Assessment Details Patient Name: Date of Service: Doyne Keel, [place] RY A. 04/03/2023 2:00 PM Medical Record Number: [id number] Patient Account Number: [id number] Date of Birth/Sex: Treating RN: [birth year] (82 y.o. Female) Heart, Schreiter A ([id number]) [id number] Page 7 of 8 Primary Care Burnetta Kohls: Sallyanne Kuster Other Clinician: Referring Rosalio Catterton: Treating Luismario Coston/Extender: Weldon Inches Weeks in Treatment: 1 Wound Status Wound Number: 1 Primary Pressure Ulcer Etiology: Wound Location:  Left Gluteus Wound Open Wounding Event: Pressure Injury Status: Date Acquired: 02/23/2023 Comorbid Chronic Obstructive Pulmonary Disease (COPD), Sleep Apnea, Weeks Of Treatment: 1 History: Congestive Heart Failure, Hypertension, Type II Diabetes Clustered Wound: No Photos Wound Measurements Length: (cm) 0.5 Width: (cm) 0.5 Depth: (cm) 0.1 Area: (cm) 0.196 Volume: (cm) 0.02 % Reduction in Area: 40.6% % Reduction in Volume: 39.4% Epithelialization: Medium (34-66%) Tunneling: No Undermining: No Wound Description Classification: Category/Stage II Exudate Amount: Medium Exudate Type: Serosanguineous Exudate Color: red, brown Foul Odor After Cleansing: No Slough/Fibrino Yes Wound Bed Granulation Amount: Small (1-33%) Exposed Structure Granulation Quality: Pink Fascia Exposed: No Necrotic Amount: Large (67-100%) Fat Layer (Subcutaneous Tissue) Exposed: Yes Necrotic Quality: Adherent Slough Tendon Exposed: No Muscle Exposed: No Joint Exposed: No Bone Exposed: No Treatment Notes Wound #1 (Gluteus) Wound Laterality: Left Cleanser Soap and Water Discharge Instruction: Gently cleanse wound with antibacterial soap, rinse and pat dry prior to dressing wounds Peri-Wound Care Topical Primary Dressing Xeroform-HBD 2x2 (in/in) Discharge Instruction: Apply Xeroform-HBD 2x2 (in/in) as directed Secondary Dressing (BORDER) Zetuvit Plus SILICONE BORDER Dressing 4x4 (in/in) Discharge Instruction: Please do not put silicone bordered dressings under wraps. Use non-bordered dressing only. Secured With Compression Wrap Compression Stockings Add-Ons RAKHIA, STEYER A ([id number]) [id number] Page 8 of 8 Electronic Signature(s) Signed: 04/03/2023 4:39:57 PM By: Angelina Pih Entered By: Angelina Pih on 04/03/2023 14:24:45 -------------------------------------------------------------------------------- Vitals Details Patient Name: Date of Service: Doyne Keel, [place]  RY A. 04/03/2023 2:00 PM Medical Record Number: [id number] Patient Account Number: [id number] Date of Birth/Sex: Treating RN: [birth year] (82 y.o. Female) Angelina Pih Primary Care Temprance Wyre: Sallyanne Kuster Other Clinician: Referring Marcelline Temkin: Treating Cameran Pettey/Extender: Weldon Inches Weeks in Treatment: 1 Vital Signs Time Taken: 14:16 Temperature (F): 97.8 Height (in): 58 Pulse (bpm): 86 Weight (lbs): 152 Respiratory Rate (breaths/min): 18 Body Mass Index (BMI): 31.8 Blood Pressure (mmHg): 117/74 Reference Range: 80 - 120 mg / dl Electronic Signature(s) Signed: 04/03/2023 4:39:57 PM By: Angelina Pih Entered By: Angelina Pih on 04/03/2023 14:17:04

## 2023-04-09 ENCOUNTER — Encounter: Payer: Medicare HMO | Admitting: Physician Assistant

## 2023-04-09 ENCOUNTER — Ambulatory Visit (INDEPENDENT_AMBULATORY_CARE_PROVIDER_SITE_OTHER): Payer: Medicare HMO | Admitting: Nurse Practitioner

## 2023-04-09 ENCOUNTER — Encounter: Payer: Self-pay | Admitting: Nurse Practitioner

## 2023-04-09 VITALS — BP 124/66 | HR 97 | Temp 98.5°F | Resp 16 | Ht <= 58 in | Wt 156.0 lb

## 2023-04-09 DIAGNOSIS — I10 Essential (primary) hypertension: Secondary | ICD-10-CM

## 2023-04-09 DIAGNOSIS — I130 Hypertensive heart and chronic kidney disease with heart failure and stage 1 through stage 4 chronic kidney disease, or unspecified chronic kidney disease: Secondary | ICD-10-CM | POA: Diagnosis not present

## 2023-04-09 DIAGNOSIS — L89322 Pressure ulcer of left buttock, stage 2: Secondary | ICD-10-CM | POA: Diagnosis not present

## 2023-04-09 DIAGNOSIS — Z79899 Other long term (current) drug therapy: Secondary | ICD-10-CM

## 2023-04-09 DIAGNOSIS — E11622 Type 2 diabetes mellitus with other skin ulcer: Secondary | ICD-10-CM | POA: Diagnosis not present

## 2023-04-09 DIAGNOSIS — J449 Chronic obstructive pulmonary disease, unspecified: Secondary | ICD-10-CM | POA: Diagnosis not present

## 2023-04-09 DIAGNOSIS — Z0001 Encounter for general adult medical examination with abnormal findings: Secondary | ICD-10-CM | POA: Diagnosis not present

## 2023-04-09 DIAGNOSIS — E782 Mixed hyperlipidemia: Secondary | ICD-10-CM | POA: Diagnosis not present

## 2023-04-09 DIAGNOSIS — E1165 Type 2 diabetes mellitus with hyperglycemia: Secondary | ICD-10-CM | POA: Diagnosis not present

## 2023-04-09 DIAGNOSIS — E1122 Type 2 diabetes mellitus with diabetic chronic kidney disease: Secondary | ICD-10-CM | POA: Diagnosis not present

## 2023-04-09 DIAGNOSIS — F17218 Nicotine dependence, cigarettes, with other nicotine-induced disorders: Secondary | ICD-10-CM | POA: Diagnosis not present

## 2023-04-09 DIAGNOSIS — I5042 Chronic combined systolic (congestive) and diastolic (congestive) heart failure: Secondary | ICD-10-CM | POA: Diagnosis not present

## 2023-04-09 DIAGNOSIS — N184 Chronic kidney disease, stage 4 (severe): Secondary | ICD-10-CM | POA: Diagnosis not present

## 2023-04-09 DIAGNOSIS — I480 Paroxysmal atrial fibrillation: Secondary | ICD-10-CM | POA: Diagnosis not present

## 2023-04-09 LAB — POCT GLYCOSYLATED HEMOGLOBIN (HGB A1C): Hemoglobin A1C: 13.5 % — AB (ref 4.0–5.6)

## 2023-04-09 MED ORDER — OXYCODONE-ACETAMINOPHEN 5-325 MG PO TABS
1.0000 | ORAL_TABLET | Freq: Two times a day (BID) | ORAL | 0 refills | Status: DC | PRN
Start: 2023-04-09 — End: 2023-06-06

## 2023-04-09 MED ORDER — TRAMADOL HCL 50 MG PO TABS
ORAL_TABLET | ORAL | 0 refills | Status: AC
Start: 2023-04-09 — End: ?

## 2023-04-09 MED ORDER — INSULIN GLARGINE 100 UNIT/ML SOLOSTAR PEN
20.0000 [IU] | PEN_INJECTOR | Freq: Every day | SUBCUTANEOUS | 3 refills | Status: DC
Start: 2023-04-09 — End: 2023-05-01

## 2023-04-09 MED ORDER — AMLODIPINE BESYLATE 5 MG PO TABS
5.0000 mg | ORAL_TABLET | Freq: Every day | ORAL | 3 refills | Status: AC
Start: 2023-04-09 — End: ?

## 2023-04-09 MED ORDER — ATORVASTATIN CALCIUM 10 MG PO TABS: 10.0000 mg | ORAL_TABLET | Freq: Every evening | ORAL | 3 refills | Status: DC

## 2023-04-09 MED ORDER — ROPINIROLE HCL 0.5 MG PO TABS: 0.5000 mg | ORAL_TABLET | Freq: Every evening | ORAL | 3 refills | Status: DC

## 2023-04-09 MED ORDER — DONEPEZIL HCL 5 MG PO TABS: 5.0000 mg | ORAL_TABLET | Freq: Every morning | ORAL | 3 refills | Status: DC

## 2023-04-09 MED ORDER — "PEN NEEDLES 3/16"" 31G X 5 MM MISC": 1.0000 | Freq: Four times a day (QID) | 1 refills | Status: DC

## 2023-04-09 MED ORDER — CITALOPRAM HYDROBROMIDE 20 MG PO TABS
20.0000 mg | ORAL_TABLET | Freq: Every day | ORAL | 3 refills | Status: DC
Start: 2023-04-09 — End: 2024-03-04

## 2023-04-09 MED ORDER — PANTOPRAZOLE SODIUM 40 MG PO TBEC: 40.0000 mg | DELAYED_RELEASE_TABLET | Freq: Two times a day (BID) | ORAL | 3 refills | Status: DC

## 2023-04-09 MED ORDER — LINAGLIPTIN 5 MG PO TABS
5.0000 mg | ORAL_TABLET | Freq: Every day | ORAL | 3 refills | Status: DC
Start: 2023-04-09 — End: 2023-08-24

## 2023-04-09 NOTE — Progress Notes (Signed)
[facility] [address] [place], [place] [address]  Internal MEDICINE  Office Visit Note  Patient Name: [name]  [id number]  [id number]  Date of Service: 04/09/2023  Chief Complaint  Patient presents with   Diabetes   Gastroesophageal Reflux   Hyperlipidemia   Hypertension   Medicare Wellness    HPI [name] presents for an annual well visit and physical exam.  Well-appearing 82 y.o. female with CHF, hypertension, AFIB, chronic respiratory failure with hypoxia, GERD, diabetes, CKD, osteoarthritis, anemia, high cholesterol, and depression  DEXA scan: done in 2017 Eye exam: does not go to eye doctor anymore  foot exam: done today.  Labs: due for thyroid and cholesterol labs  New or worsening pain: chronic pain, no change, takes pain medication  Other concerns: A1c is severely elevated at 13.5, lantus dose was increased to 14 units daily after glucose was elevated in the 400s      04/09/2023   11:04 AM 04/03/2022   10:11 AM 03/28/2021   10:15 AM  MMSE - Mini Mental State Exam  Orientation to time 0 4 5  Orientation to Place 0 5 5  Registration 3 2 3   Attention/ Calculation 0 5 0  Recall 3 3 3   Language- name 2 objects 0 2 2  Language- repeat 1 1 1   Language- follow 3 step command 3 3 3   Language- read & follow direction 1 1 1   Write a sentence 0 1 0  Copy design 1 1 1   Total score 12 28 24     Functional Status Survey: Is the patient deaf or have difficulty hearing?: Yes Does the patient have difficulty seeing, even when wearing glasses/contacts?: Yes Does the patient have difficulty concentrating, remembering, or making decisions?: No Does the patient have difficulty walking or climbing stairs?: Yes Does the patient have difficulty dressing or bathing?: No Does the patient have difficulty doing errands alone such as visiting a doctor's office or shopping?: No     09/21/2022    3:33 PM 10/19/2022    9:19 PM 11/16/2022    1:48 PM 12/25/2022    1:48  PM 04/09/2023   11:02 AM  Fall Risk  Falls in the past year? 1  0 1 1  Was there an injury with Fall? 1   1 0  Fall Risk Category Calculator 3   2 2   Fall Risk Category (Retired) High      (RETIRED) Patient Fall Risk Level Moderate fall risk Low fall risk     Fall risk Follow up Falls evaluation completed   Falls evaluation completed Falls evaluation completed       04/09/2023   11:02 AM  Depression screen PHQ 2/9  Decreased Interest 0  Down, Depressed, Hopeless 0  PHQ - 2 Score 0       07/12/2021   11:07 AM 02/28/2021   10:01 AM  GAD 7 : Generalized Anxiety Score  Nervous, Anxious, on Edge 0 0  Control/stop worrying 0 0  Worry too much - different things 0 0  Trouble relaxing 0 0  Restless 0 0  Easily annoyed or irritable 0 0  Afraid - awful might happen 0 0  Total GAD 7 Score 0 0  Anxiety Difficulty Not difficult at all Not difficult at all      Current Medication: Outpatient Encounter Medications as of 04/09/2023  Medication Sig   cyclobenzaprine (FLEXERIL) 5 MG tablet TAKE 1 TABLET BY MOUTH AT BEDTIME   ferrous sulfate  325 (65 FE) MG tablet Take 1 tablet (325 mg total) by mouth daily with breakfast.   LINZESS 145 MCG CAPS capsule Take 1 capsule by mouth at bedtime   melatonin 3 MG TABS tablet Take 3 mg by mouth at bedtime as needed (sleep).   methocarbamol (ROBAXIN) 500 MG tablet Take 1 tablet (500 mg total) by mouth in the morning and at bedtime.   montelukast (SINGULAIR) 10 MG tablet TAKE 1 TABLET BY MOUTH AT BEDTIME   mupirocin ointment (BACTROBAN) 2 % Apply 1 Application topically daily. To wound until healed.   ondansetron (ZOFRAN-ODT) 4 MG disintegrating tablet Take 1 tablet (4 mg total) by mouth every 6 (six) hours as needed for nausea or vomiting.   torsemide (DEMADEX) 20 MG tablet Take 2.5 tablets (50 mg total) by mouth daily.   vitamin B-12 1000 MCG tablet Take 1 tablet (1,000 mcg total) by mouth daily.   warfarin (COUMADIN) 4 MG tablet TAKE 1 TABLET BY  MOUTH ON MONDAY, WEDNESDAY AND FRIDAY   [DISCONTINUED] amLODipine (NORVASC) 5 MG tablet Take 1 tablet by mouth once daily   [DISCONTINUED] atorvastatin (LIPITOR) 10 MG tablet TAKE 1 TABLET BY MOUTH ONCE DAILY IN THE EVENING   [DISCONTINUED] citalopram (CELEXA) 20 MG tablet Take 1 tablet (20 mg total) by mouth daily.   [DISCONTINUED] donepezil (ARICEPT) 5 MG tablet TAKE 1 TABLET BY MOUTH IN THE MORNING   [DISCONTINUED] insulin glargine (LANTUS) 100 UNIT/ML Solostar Pen Inject 14 Units into the skin daily.   [DISCONTINUED] Insulin Pen Needle (PEN NEEDLES 3/16") 31G X 5 MM MISC 1 applicator by Does not apply route 4 (four) times daily.   [DISCONTINUED] linagliptin (TRADJENTA) 5 MG TABS tablet Take 1 tablet (5 mg total) by mouth daily.   [DISCONTINUED] oxyCODONE-acetaminophen (PERCOCET/ROXICET) 5-325 MG tablet Take 1 tablet by mouth 2 (two) times daily as needed for severe pain.   [DISCONTINUED] oxyCODONE-acetaminophen (PERCOCET/ROXICET) 5-325 MG tablet Take 1 tablet by mouth 2 (two) times daily as needed for severe pain.   [DISCONTINUED] pantoprazole (PROTONIX) 40 MG tablet Take 1 tablet by mouth twice daily   [DISCONTINUED] rOPINIRole (REQUIP) 0.5 MG tablet Take 1 tablet by mouth in the evening   [DISCONTINUED] traMADol (ULTRAM) 50 MG tablet Take one tab po bid prn for pain   amLODipine (NORVASC) 5 MG tablet Take 1 tablet (5 mg total) by mouth daily.   atorvastatin (LIPITOR) 10 MG tablet Take 1 tablet (10 mg total) by mouth every evening.   citalopram (CELEXA) 20 MG tablet Take 1 tablet (20 mg total) by mouth daily.   donepezil (ARICEPT) 5 MG tablet Take 1 tablet (5 mg total) by mouth every morning.   insulin glargine (LANTUS) 100 UNIT/ML Solostar Pen Inject 20 Units into the skin daily.   Insulin Pen Needle (PEN NEEDLES 3/16") 31G X 5 MM MISC 1 applicator by Does not apply route 4 (four) times daily.   linagliptin (TRADJENTA) 5 MG TABS tablet Take 1 tablet (5 mg total) by mouth daily.    oxyCODONE-acetaminophen (PERCOCET/ROXICET) 5-325 MG tablet Take 1 tablet by mouth 2 (two) times daily as needed for severe pain.   oxyCODONE-acetaminophen (PERCOCET/ROXICET) 5-325 MG tablet Take 1 tablet by mouth 2 (two) times daily as needed for severe pain.   pantoprazole (PROTONIX) 40 MG tablet Take 1 tablet (40 mg total) by mouth 2 (two) times daily.   rOPINIRole (REQUIP) 0.5 MG tablet Take 1 tablet (0.5 mg total) by mouth every evening.   traMADol (ULTRAM) 50 MG tablet  Take one tab po bid prn for pain   warfarin (COUMADIN) 2 MG tablet Take 1 tab po Tuesday ,Thursday ,Friday ad Saturday and Sunday and then take 4 mg rest of week   No facility-administered encounter medications on file as of 04/09/2023.    Surgical History: Past Surgical History:  Procedure Laterality Date   ABDOMINAL HYSTERECTOMY     APPENDECTOMY     CATARACT EXTRACTION     CHOLECYSTECTOMY     HERNIA REPAIR     right knee replacement     right nephroectomy      Medical History: Past Medical History:  Diagnosis Date   Anemia    Atrial fibrillation (HCC)    Chronic kidney disease    Congestive heart failure (CHF) (HCC)    COPD (chronic obstructive pulmonary disease) (HCC)    Diabetes mellitus without complication (HCC)    GERD (gastroesophageal reflux disease)    Hyperlipidemia    Hypertension    Pneumonia    Pulmonary fibrosis (HCC)     Family History: Family History  Problem Relation Age of Onset   Diabetes Mother    Breast cancer Mother    Heart disease Father    Diabetes Father    Diabetes Sister    Diabetes Brother    Cancer Brother     Social History   Socioeconomic History   Marital status: Widowed    Spouse name: Not on file   Number of children: Not on file   Years of education: Not on file   Highest education level: Not on file  Occupational History   Occupation: retired  Tobacco Use   Smoking status: Former    Packs/day: 1    Types: Cigarettes    Quit date: 08/19/2022     Years since quitting: 0.6   Smokeless tobacco: Never   Tobacco comments:    1 pack daily---quit 1 month   Vaping Use   Vaping Use: Never used  Substance and Sexual Activity   Alcohol use: No   Drug use: No   Sexual activity: Not Currently  Other Topics Concern   Not on file  Social History Narrative   Lives with daughter   Social Determinants of Health   Financial Resource Strain: Not on file  Food Insecurity: Not on file  Transportation Needs: Not on file  Physical Activity: Not on file  Stress: Not on file  Social Connections: Not on file  Intimate Partner Violence: Not on file      Review of Systems  Constitutional:  Positive for fatigue. Negative for activity change, appetite change, chills, fever and unexpected weight change.  HENT: Negative.  Negative for congestion, ear pain, rhinorrhea, sore throat and trouble swallowing.   Eyes: Negative.   Respiratory: Negative.  Negative for cough, chest tightness, shortness of breath and wheezing.   Cardiovascular: Negative.  Negative for chest pain and palpitations.  Gastrointestinal:  Positive for nausea. Negative for abdominal pain, blood in stool, constipation, diarrhea and vomiting.  Endocrine: Negative.   Genitourinary: Negative.  Negative for difficulty urinating, dysuria, frequency, hematuria and urgency.  Musculoskeletal:  Positive for arthralgias and myalgias. Negative for back pain, joint swelling and neck pain.  Skin: Negative.  Negative for rash and wound.  Allergic/Immunologic: Negative.  Negative for immunocompromised state.  Neurological: Negative.  Negative for dizziness, seizures, numbness and headaches.  Hematological: Negative.   Psychiatric/Behavioral: Negative.  Negative for behavioral problems, self-injury and suicidal ideas. The patient is not nervous/anxious.  Vital Signs: BP 124/66   Pulse 97   Temp 98.5 F (36.9 C)   Resp 16   Ht 4\' 5"  (1.346 m)   Wt 156 lb (70.8 kg)   SpO2 96%   BMI  39.05 kg/m    Physical Exam Vitals reviewed.  Constitutional:      General: She is awake. She is not in acute distress.    Appearance: Normal appearance. She is well-developed and well-groomed. She is obese. She is not ill-appearing or diaphoretic.  HENT:     Head: Normocephalic and atraumatic.     Right Ear: Tympanic membrane, ear canal and external ear normal.     Left Ear: Tympanic membrane, ear canal and external ear normal.     Nose: Nose normal. No congestion or rhinorrhea.     Mouth/Throat:     Lips: Pink.     Mouth: Mucous membranes are moist.     Pharynx: Oropharynx is clear. Uvula midline. No oropharyngeal exudate or posterior oropharyngeal erythema.  Eyes:     General: Lids are normal. Vision grossly intact. Gaze aligned appropriately. No scleral icterus.       Right eye: No discharge.        Left eye: No discharge.     Extraocular Movements: Extraocular movements intact.     Conjunctiva/sclera: Conjunctivae normal.     Pupils: Pupils are equal, round, and reactive to light.     Funduscopic exam:    Right eye: Red reflex present.        Left eye: Red reflex present. Neck:     Thyroid: No thyromegaly.     Vascular: No JVD.     Trachea: No tracheal deviation.  Cardiovascular:     Rate and Rhythm: Normal rate and regular rhythm.     Pulses: Normal pulses.          Dorsalis pedis pulses are 2+ on the right side and 2+ on the left side.       Posterior tibial pulses are 2+ on the right side and 2+ on the left side.     Heart sounds: Normal heart sounds, S1 normal and S2 normal. No murmur heard.    No friction rub. No gallop.  Pulmonary:     Effort: Pulmonary effort is normal. No accessory muscle usage or respiratory distress.     Breath sounds: Normal breath sounds and air entry. No stridor. No wheezing or rales.  Chest:     Chest wall: No tenderness.     Comments: Declined clinical breast exam.  Abdominal:     General: Bowel sounds are normal. There is no  distension.     Palpations: Abdomen is soft. There is no mass.     Tenderness: There is no abdominal tenderness. There is no guarding or rebound.  Musculoskeletal:        General: No tenderness or deformity. Normal range of motion.     Cervical back: Normal range of motion and neck supple.     Right lower leg: No edema.     Left lower leg: No edema.     Right foot: Normal range of motion. No deformity, bunion, Charcot foot, foot drop or prominent metatarsal heads.     Left foot: Normal range of motion. No deformity, bunion, Charcot foot, foot drop or prominent metatarsal heads.  Feet:     Right foot:     Protective Sensation: 6 sites tested.  6 sites sensed.     Skin integrity: Dry  skin present. No ulcer, blister, skin breakdown, erythema, warmth, callus or fissure.     Toenail Condition: Right toenails are normal.     Left foot:     Protective Sensation: 6 sites tested.  6 sites sensed.     Skin integrity: Dry skin present. No ulcer, blister, skin breakdown, erythema, warmth, callus or fissure.     Toenail Condition: Left toenails are normal.  Lymphadenopathy:     Cervical: No cervical adenopathy.  Skin:    General: Skin is warm and dry.     Coloration: Skin is not pale.     Findings: No erythema or rash.  Neurological:     Mental Status: She is alert and oriented to person, place, and time.     Cranial Nerves: No cranial nerve deficit.     Motor: No abnormal muscle tone.     Coordination: Coordination normal.     Gait: Gait normal.     Deep Tendon Reflexes: Reflexes are normal and symmetric.  Psychiatric:        Mood and Affect: Mood and affect normal.        Behavior: Behavior normal. Behavior is cooperative.        Thought Content: Thought content normal.        Judgment: Judgment normal.        Assessment/Plan: 1. Encounter for routine adult health examination with abnormal findings Age-appropriate preventive screenings and vaccinations discussed, annual physical  exam completed. Routine labs for health maintenance ordered, see below. PHM updated.   2. Uncontrolled type 2 diabetes mellitus with hyperglycemia (HCC) A1c severely elevated at 13.5. continue medications as prescribed. Lantus insulin dose increased to 20. [name] will call the clinic in a couple of weeks to check in with her fasting glucose levels . - POCT glycosylated hemoglobin (Hb A1C) - linagliptin (TRADJENTA) 5 MG TABS tablet; Take 1 tablet (5 mg total) by mouth daily.  Dispense: 90 tablet; Refill: 3 - TSH + free T4  3. Essential hypertension Routine labs ordered, continue amlodipine and other medications as prescribed.  - amLODipine (NORVASC) 5 MG tablet; Take 1 tablet (5 mg total) by mouth daily.  Dispense: 90 tablet; Refill: 3 - TSH + free T4  4. Mixed hyperlipidemia Routine labs ordered - Lipid Profile - TSH + free T4  5. Encounter for medication review Medication list reviewed with patient and daughter, updated and refills ordered - traMADol (ULTRAM) 50 MG tablet; Take one tab po bid prn for pain  Dispense: 180 tablet; Refill: 0 - oxyCODONE-acetaminophen (PERCOCET/ROXICET) 5-325 MG tablet; Take 1 tablet by mouth 2 (two) times daily as needed for severe pain.  Dispense: 60 tablet; Refill: 0 - oxyCODONE-acetaminophen (PERCOCET/ROXICET) 5-325 MG tablet; Take 1 tablet by mouth 2 (two) times daily as needed for severe pain.  Dispense: 60 tablet; Refill: 0 - amLODipine (NORVASC) 5 MG tablet; Take 1 tablet (5 mg total) by mouth daily.  Dispense: 90 tablet; Refill: 3 - linagliptin (TRADJENTA) 5 MG TABS tablet; Take 1 tablet (5 mg total) by mouth daily.  Dispense: 90 tablet; Refill: 3 - insulin glargine (LANTUS) 100 UNIT/ML Solostar Pen; Inject 20 Units into the skin daily.  Dispense: 15 mL; Refill: 3 - Insulin Pen Needle (PEN NEEDLES 3/16") 31G X 5 MM MISC; 1 applicator by Does not apply route 4 (four) times daily.  Dispense: 100 each; Refill: 1 - atorvastatin (LIPITOR) 10 MG  tablet; Take 1 tablet (10 mg total) by mouth every evening.  Dispense: 90 tablet;  Refill: 3 - citalopram (CELEXA) 20 MG tablet; Take 1 tablet (20 mg total) by mouth daily.  Dispense: 90 tablet; Refill: 3 - donepezil (ARICEPT) 5 MG tablet; Take 1 tablet (5 mg total) by mouth every morning.  Dispense: 90 tablet; Refill: 3 - rOPINIRole (REQUIP) 0.5 MG tablet; Take 1 tablet (0.5 mg total) by mouth every evening.  Dispense: 90 tablet; Refill: 3 - pantoprazole (PROTONIX) 40 MG tablet; Take 1 tablet (40 mg total) by mouth 2 (two) times daily.  Dispense: 180 tablet; Refill: 3     General Counseling: [name] verbalizes understanding of the findings of todays visit and agrees with plan of treatment. I have discussed any further diagnostic evaluation that may be needed or ordered today. We also reviewed her medications today. she has been encouraged to call the office with any questions or concerns that should arise related to todays visit.    Orders Placed This Encounter  Procedures   Lipid Profile   TSH + free T4   POCT glycosylated hemoglobin (Hb A1C)    Meds ordered this encounter  Medications   traMADol (ULTRAM) 50 MG tablet    Sig: Take one tab po bid prn for pain    Dispense:  180 tablet    Refill:  0    Please fill as 90 day supply   oxyCODONE-acetaminophen (PERCOCET/ROXICET) 5-325 MG tablet    Sig: Take 1 tablet by mouth 2 (two) times daily as needed for severe pain.    Dispense:  60 tablet    Refill:  0    refill   oxyCODONE-acetaminophen (PERCOCET/ROXICET) 5-325 MG tablet    Sig: Take 1 tablet by mouth 2 (two) times daily as needed for severe pain.    Dispense:  60 tablet    Refill:  0    refill   amLODipine (NORVASC) 5 MG tablet    Sig: Take 1 tablet (5 mg total) by mouth daily.    Dispense:  90 tablet    Refill:  3   linagliptin (TRADJENTA) 5 MG TABS tablet    Sig: Take 1 tablet (5 mg total) by mouth daily.    Dispense:  90 tablet    Refill:  3   insulin glargine (LANTUS)  100 UNIT/ML Solostar Pen    Sig: Inject 20 Units into the skin daily.    Dispense:  15 mL    Refill:  3    Switch to lantus instead of levemir   Insulin Pen Needle (PEN NEEDLES 3/16") 31G X 5 MM MISC    Sig: 1 applicator by Does not apply route 4 (four) times daily.    Dispense:  100 each    Refill:  1   atorvastatin (LIPITOR) 10 MG tablet    Sig: Take 1 tablet (10 mg total) by mouth every evening.    Dispense:  90 tablet    Refill:  3   citalopram (CELEXA) 20 MG tablet    Sig: Take 1 tablet (20 mg total) by mouth daily.    Dispense:  90 tablet    Refill:  3   donepezil (ARICEPT) 5 MG tablet    Sig: Take 1 tablet (5 mg total) by mouth every morning.    Dispense:  90 tablet    Refill:  3    For future refills   rOPINIRole (REQUIP) 0.5 MG tablet    Sig: Take 1 tablet (0.5 mg total) by mouth every evening.    Dispense:  90 tablet  Refill:  3   pantoprazole (PROTONIX) 40 MG tablet    Sig: Take 1 tablet (40 mg total) by mouth 2 (two) times daily.    Dispense:  180 tablet    Refill:  3    Return in about 3 months (around 07/10/2023) for F/U, Recheck A1C, [name] PCP and also daughter to call in a couple weeks about fasting sugars .   Total time spent:30 Minutes Time spent includes review of chart, medications, test results, and follow up plan with the patient.   [place] Controlled Substance Database was reviewed by me.  This patient was seen by Sallyanne Kuster, FNP-C in collaboration with Dr. Beverely Risen as a part of collaborative care agreement.  Karmella Bouvier R. Tedd Sias, MSN, FNP-C Internal medicine

## 2023-04-09 NOTE — Progress Notes (Signed)
MIRANDA, MARMER [name] ([id number]) [id number] Page 1 of 10 Visit Report for 04/09/2023 Arrival Information Details Patient Name: Date of Service: [name], [place] [facility] [name]. 04/09/2023 8:30 [name] M Medical Record Number: [id number] Patient Account Number: [id number] Date of Birth/Sex: Treating RN: [birth year] (82 y.o. [name] Primary Care Anjali Manzella: Sallyanne Kuster Other Clinician: Referring Theadore Blunck: Treating Preston Weill/Extender: Weldon Inches Weeks in Treatment: 2 Visit Information History Since Last Visit Added or deleted any medications: No Patient Arrived: Cane Has Dressing in Place as Prescribed: Yes Arrival Time: 08:36 Pain Present Now: No Accompanied By: daughter Transfer Assistance: None Patient Identification Verified: Yes Secondary Verification Process Completed: Yes Patient Requires Transmission-Based Precautions: No Patient Has Alerts: Yes Patient Alerts: Patient on Blood Thinner Diabetes type 2 Coumadin Electronic Signature(s) Signed: 04/09/2023 4:19:38 PM By: Midge Aver MSN RN CNS WTA Entered By: Midge Aver on 04/09/2023 08:41:57 -------------------------------------------------------------------------------- Clinic Level of Care Assessment Details Patient Name: Date of Service: [name], [name]. 04/09/2023 8:30 [name] M Medical Record Number: [id number] Patient Account Number: [id number] Date of Birth/Sex: Treating RN: [birth year] (82 y.o. [name] Primary Care [name]: Sallyanne Kuster Other Clinician: Referring [name]: Treating [name]/Extender: Weldon Inches Weeks in Treatment: 2 Clinic Level of Care Assessment Items TOOL 4 Quantity Score X- 1 0 Use when only an EandM is performed on FOLLOW-UP visit ASSESSMENTS - Nursing Assessment / Reassessment X- 1 10 Reassessment of Co-morbidities (includes updates in patient status) X- 1 5 Reassessment of Adherence to Treatment Plan ASSESSMENTS - Wound and Skin  [name] ssessment / Reassessment X - Simple Wound Assessment / Reassessment - one wound 1 5 [name], [name] ([id number]) [id number] Page 2 of 10 []  - 0 Complex Wound Assessment / Reassessment - multiple wounds []  - 0 Dermatologic / Skin Assessment (not related to wound area) ASSESSMENTS - Focused Assessment []  - 0 Circumferential Edema Measurements - multi extremities []  - 0 Nutritional Assessment / Counseling / Intervention []  - 0 Lower Extremity Assessment (monofilament, tuning fork, pulses) []  - 0 Peripheral Arterial Disease Assessment (using hand held doppler) ASSESSMENTS - Ostomy and/or Continence Assessment and Care []  - 0 Incontinence Assessment and Management []  - 0 Ostomy Care Assessment and Management (repouching, etc.) PROCESS - Coordination of Care X - Simple Patient / Family Education for ongoing care 1 15 []  - 0 Complex (extensive) Patient / Family Education for ongoing care X- 1 10 Staff obtains Chiropractor, Records, T Results / Process Orders est []  - 0 Staff telephones HHA, Nursing Homes / Clarify orders / etc []  - 0 Routine Transfer to another Facility (non-emergent condition) []  - 0 Routine Hospital Admission (non-emergent condition) []  - 0 New Admissions / Manufacturing engineer / Ordering NPWT Apligraf, etc. , []  - 0 Emergency Hospital Admission (emergent condition) X- 1 10 Simple Discharge Coordination []  - 0 Complex (extensive) Discharge Coordination PROCESS - Special Needs []  - 0 Pediatric / Minor Patient Management []  - 0 Isolation Patient Management []  - 0 Hearing / Language / Visual special needs []  - 0 Assessment of Community assistance (transportation, D/C planning, etc.) []  - 0 Additional assistance / Altered mentation []  - 0 Support Surface(s) Assessment (bed, cushion, seat, etc.) INTERVENTIONS - Wound Cleansing / Measurement X - Simple Wound Cleansing - one wound 1 5 []  - 0 Complex Wound Cleansing - multiple  wounds X- 1 5 Wound Imaging (photographs - any number of wounds) []  - 0 Wound Tracing (instead of photographs) X- 1 5 Simple Wound Measurement - one wound []  -  0 Complex Wound Measurement - multiple wounds INTERVENTIONS - Wound Dressings X - Small Wound Dressing one or multiple wounds 1 10 []  - 0 Medium Wound Dressing one or multiple wounds []  - 0 Large Wound Dressing one or multiple wounds []  - 0 Application of Medications - topical []  - 0 Application of Medications - injection INTERVENTIONS - Miscellaneous []  - 0 External ear exam []  - 0 Specimen Collection (cultures, biopsies, blood, body fluids, etc.) [name], [name] ([id number]) [id number] Page 3 of 10 []  - 0 Specimen(s) / Culture(s) sent or taken to Lab for analysis []  - 0 Patient Transfer (multiple staff / Michiel Sites Lift / Similar devices) []  - 0 Simple Staple / Suture removal (25 or less) []  - 0 Complex Staple / Suture removal (26 or more) []  - 0 Hypo / Hyperglycemic Management (close monitor of Blood Glucose) []  - 0 Ankle / Brachial Index (ABI) - do not check if billed separately X- 1 5 Vital Signs Has the patient been seen at the hospital within the last three years: Yes Total Score: 85 Level Of Care: New/Established - Level 3 Electronic Signature(s) Signed: 04/09/2023 4:19:38 PM By: Midge Aver MSN RN CNS WTA Entered By: Midge Aver on 04/09/2023 08:54:39 -------------------------------------------------------------------------------- Encounter Discharge Information Details Patient Name: Date of Service: Amy Keel, MA RY [name]. 04/09/2023 8:30 [name] M Medical Record Number: [id number] Patient Account Number: [id number] Date of Birth/Sex: Treating RN: [name] (82 y.o. [name] Primary Care Jenner Rosier: Sallyanne Kuster Other Clinician: Referring [name]: Treating [name]/Extender: Weldon Inches Weeks in Treatment: 2 Encounter Discharge Information Items Discharge  Condition: Stable Ambulatory Status: Cane Discharge Destination: Home Transportation: Private Auto Accompanied By: daughter Schedule Follow-up Appointment: Yes Clinical Summary of Care: Electronic Signature(s) Signed: 04/09/2023 4:19:38 PM By: Midge Aver MSN RN CNS WTA Entered By: Midge Aver on 04/09/2023 08:56:05 -------------------------------------------------------------------------------- Lower Extremity Assessment Details Patient Name: Date of Service: Amy Keel, MA RY [name]. 04/09/2023 8:30 [name] M Medical Record Number: [id number] Patient Account Number: [id number] Date of Birth/Sex: Treating RN: [birth year] (82 y.o. Meddie, Venneman, Milo [name] ([id number]) [id number] Page 4 of 10 Primary Care Ritik Stavola: Sallyanne Kuster Other Clinician: Referring Denilson Salminen: Treating Bianca Vester/Extender: Weldon Inches Weeks in Treatment: 2 Electronic Signature(s) Signed: 04/09/2023 4:19:38 PM By: Midge Aver MSN RN CNS WTA Entered By: Midge Aver on 04/09/2023 08:47:55 -------------------------------------------------------------------------------- Multi Wound Chart Details Patient Name: Date of Service: Amy Keel, MA RY [name]. 04/09/2023 8:30 [name] M Medical Record Number: [id number] Patient Account Number: [id number] Date of Birth/Sex: Treating RN: [birth year][name] (82 y.o. [name] Primary Care Mayreli Alden: Sallyanne Kuster Other Clinician: Referring Dorothy Landgrebe: Treating Jhamal Plucinski/Extender: Weldon Inches Weeks in Treatment: 2 Vital Signs Height(in): 58 Pulse(bpm): 101 Weight(lbs): 152 Blood Pressure(mmHg): 112/64 Body Mass Index(BMI): 31.8 Temperature(F): 97.5 Respiratory Rate(breaths/min): 18 [1:Photos:] [N/[name]:N/[name]] Left Gluteus N/[name] N/[name] Wound Location: Pressure Injury N/[name] N/[name] Wounding Event: Pressure Ulcer N/[name] N/[name] Primary Etiology: Chronic Obstructive Pulmonary N/[name] N/[name] Comorbid History: Disease (COPD), Sleep Apnea, Congestive Heart  Failure, Hypertension, Type II Diabetes 02/23/2023 N/[name] N/[name] Date Acquired: 2 N/[name] N/[name] Weeks of Treatment: Open N/[name] N/[name] Wound Status: No N/[name] N/[name] Wound Recurrence: 0.5x0.5x0.1 N/[name] N/[name] Measurements L x W x D (cm) 0.196 N/[name] N/[name] [name] (cm) : rea 0.02 N/[name] N/[name] Volume (cm) : 40.60% N/[name] N/[name] % Reduction in [name] rea: 39.40% N/[name] N/[name] % Reduction in Volume: Category/Stage II N/[name] N/[name] Classification: Medium N/[name] N/[name] Exudate [name] mount: Serosanguineous N/[name] N/[name] Exudate Type: red, brown N/[name] N/[name] Exudate Color: Small (  1-33%) N/[name] N/[name] Granulation [name] mount: Pink N/[name] N/[name] Granulation Quality: Large (67-100%) N/[name] N/[name] Necrotic [name] mount: Fat Layer (Subcutaneous Tissue): Yes N/[name] N/[name] Exposed Structures: Fascia: No Tendon: No Muscle: No Joint: No Bone: No LILIENNE, HILBERG [name] ([id number]) [id number] Page 5 of 10 Medium (34-66%) N/[name] N/[name] Epithelialization: Treatment Notes Electronic Signature(s) Signed: 04/09/2023 4:19:38 PM By: Midge Aver MSN RN CNS WTA Entered By: Midge Aver on 04/09/2023 08:48:31 -------------------------------------------------------------------------------- Multi-Disciplinary Care Plan Details Patient Name: Date of Service: Amy Keel, MA RY [name]. 04/09/2023 8:30 [name] M Medical Record Number: [id number] Patient Account Number: [id number] Date of Birth/Sex: Treating RN: [birth year][name] (82 y.o. [name] Primary Care Iola Turri: Sallyanne Kuster Other Clinician: Referring Farrell Pantaleo: Treating Rucha Wissinger/Extender: Weldon Inches Weeks in Treatment: 2 Active Inactive Abuse / Safety / Falls / Self Care Management Nursing Diagnoses: Impaired physical mobility Potential for falls Self care deficit: actual or potential Goals: Patient will remain injury free Date Initiated: 03/26/2023 Target Resolution Date: 04/25/2023 Goal Status: Active Patient/caregiver will verbalize understanding of skin care regimen Date Initiated: 03/26/2023 Target Resolution Date:  04/25/2023 Goal Status: Active Patient/caregiver will verbalize/demonstrate measure taken to improve self care Date Initiated: 03/26/2023 Target Resolution Date: 04/25/2023 Goal Status: Active Patient/caregiver will verbalize/demonstrate measures taken to prevent injury and/or falls Date Initiated: 03/26/2023 Target Resolution Date: 04/25/2023 Goal Status: Active Interventions: Assess fall risk on admission and as needed Assess: immobility, friction, shearing, incontinence upon admission and as needed Assess impairment of mobility on admission and as needed per policy Assess self care needs on admission and as needed Notes: Nutrition Nursing Diagnoses: Impaired glucose control: actual or potential Goals: Patient/caregiver agrees to and verbalizes understanding of need to use nutritional supplements and/or vitamins as prescribed Date Initiated: 03/26/2023 Target Resolution Date: 04/25/2023 Goal Status: Active Patient/caregiver verbalizes understanding of need to maintain therapeutic glucose control per primary care physician HARLYNN, AUDAS [name] ([id number]) [id number] Page 6 of 10 Date Initiated: 03/26/2023 Target Resolution Date: 04/25/2023 Goal Status: Active Patient/caregiver will maintain therapeutic glucose control Date Initiated: 03/26/2023 Target Resolution Date: 04/25/2023 Goal Status: Active Interventions: Provide education on elevated blood sugars and impact on wound healing Notes: Pressure Nursing Diagnoses: Knowledge deficit related to causes and risk factors for pressure ulcer development Knowledge deficit related to management of pressures ulcers Potential for impaired tissue integrity related to pressure, friction, moisture, and shear Goals: Patient will remain free from development of additional pressure ulcers Date Initiated: 03/26/2023 Date Inactivated: 04/09/2023 Target Resolution Date: 04/25/2023 Goal Status: Met Patient will remain free of pressure  ulcers Date Initiated: 03/26/2023 Target Resolution Date: 04/25/2023 Goal Status: Active Patient/caregiver will verbalize risk factors for pressure ulcer development Date Initiated: 03/26/2023 Target Resolution Date: 04/25/2023 Goal Status: Active Patient/caregiver will verbalize understanding of pressure ulcer management Date Initiated: 03/26/2023 Target Resolution Date: 04/25/2023 Goal Status: Active Interventions: Assess: immobility, friction, shearing, incontinence upon admission and as needed Assess offloading mechanisms upon admission and as needed Assess potential for pressure ulcer upon admission and as needed Provide education on pressure ulcers Notes: Wound/Skin Impairment Nursing Diagnoses: Impaired tissue integrity Knowledge deficit related to smoking impact on wound healing Knowledge deficit related to ulceration/compromised skin integrity Goals: Patient will demonstrate [name] reduced rate of smoking or cessation of smoking Date Initiated: 03/26/2023 Date Inactivated: 04/09/2023 Target Resolution Date: 04/26/2023 Goal Status: Met Ulcer/skin breakdown will have [name] volume reduction of 30% by week 4 Date Initiated: 03/26/2023 Target Resolution Date: 04/25/2023 Goal Status: Active Ulcer/skin breakdown will have [name] volume reduction of 50% by week 8 Date  Initiated: 03/26/2023 Target Resolution Date: 05/26/2023 Goal Status: Active Ulcer/skin breakdown will have [name] volume reduction of 80% by week 12 Date Initiated: 03/26/2023 Target Resolution Date: 06/26/2023 Goal Status: Active Interventions: Assess patient/caregiver ability to obtain necessary supplies Assess patient/caregiver ability to perform ulcer/skin care regimen upon admission and as needed Assess ulceration(s) every visit Provide education on smoking Provide education on ulcer and skin care Treatment Activities: Skin care regimen initiated : 03/26/2023 Notes: Electronic Signature(s) Signed: 04/09/2023 4:19:38 PM By: Midge Aver MSN RN  CNS Fransisco Beau, Corrie Dandy [name] ([id number]) [id number] Page 7 of 10 Entered By: Midge Aver on 04/09/2023 08:55:13 -------------------------------------------------------------------------------- Pain Assessment Details Patient Name: Date of Service: [name], [place] W[place] [name]. 04/09/2023 8:30 [name] M Medical Record Number: [id number] Patient Account Number: [id number] Date of Birth/Sex: Treating RN: October 23, 1941 (82 y.o. [name] Primary Care Aydenn Gervin: Sallyanne Kuster Other Clinician: Referring Anaria Kroner: Treating Skylin Kennerson/Extender: Weldon Inches Weeks in Treatment: 2 Active Problems Location of Pain Severity and Description of Pain Patient Has Paino No Site Locations Pain Management and Medication Current Pain Management: Electronic Signature(s) Signed: 04/09/2023 4:19:38 PM By: Midge Aver MSN RN CNS WTA Entered By: Midge Aver on 04/09/2023 08:42:24 -------------------------------------------------------------------------------- Patient/Caregiver Education Details Patient Name: Date of Service: Amy Keel, MA RY [name]. 6/17/2024andnbsp8:30 [name] M Medical Record Number: [id number] Patient Account Number: [id number] Date of Birth/Gender: Treating RN: [birth year][name] (82 y.o. [name] Primary Care Physician: Sallyanne Kuster Other Clinician: SHEPHANIE, SPAUGH ([id number]) [id number] Page 8 of 10 Referring Physician: Treating Physician/Extender: Weldon Inches Weeks in Treatment: 2 Education Assessment Education Provided To: Patient Education Topics Provided Wound/Skin Impairment: Handouts: Caring for Your Ulcer Methods: Explain/Verbal Responses: State content correctly Electronic Signature(s) Signed: 04/09/2023 4:19:38 PM By: Midge Aver MSN RN CNS WTA Entered By: Midge Aver on 04/09/2023 08:55:24 -------------------------------------------------------------------------------- Wound Assessment  Details Patient Name: Date of Service: Amy Keel, MA RY [name]. 04/09/2023 8:30 [name] M Medical Record Number: [id number] Patient Account Number: [id number] Date of Birth/Sex: Treating RN: [name][birth year] (82 y.o. [name] Primary Care Misha Antonini: Sallyanne Kuster Other Clinician: Referring Diamonique Ruedas: Treating [place] Duquette/Extender: Weldon Inches Weeks in Treatment: 2 Wound Status Wound Number: 1 Primary Pressure Ulcer Etiology: Wound Location: Left Gluteus Wound Open Wounding Event: Pressure Injury Status: Date Acquired: 02/23/2023 Comorbid Chronic Obstructive Pulmonary Disease (COPD), Sleep Apnea, Weeks Of Treatment: 2 History: Congestive Heart Failure, Hypertension, Type II Diabetes Clustered Wound: No Photos Wound Measurements Length: (cm) 0.5 Width: (cm) 0.5 Depth: (cm) 0.1 Area: (cm) 0.196 Volume: (cm) 0.02 % Reduction in Area: 40.6% % Reduction in Volume: 39.4% Epithelialization: Medium (34-66%) Wound Description SHAUNISE, TANTILLO [name] ([id number]) Classification: Category/Stage II Exudate Amount: Medium Exudate Type: Serosanguineous Exudate Color: red, brown [id number] Page 9 of 10 Foul Odor After Cleansing: No Slough/Fibrino Yes Wound Bed Granulation Amount: Small (1-33%) Exposed Structure Granulation Quality: Pink Fascia Exposed: No Necrotic Amount: Large (67-100%) Fat Layer (Subcutaneous Tissue) Exposed: Yes Necrotic Quality: Adherent Slough Tendon Exposed: No Muscle Exposed: No Joint Exposed: No Bone Exposed: No Treatment Notes Wound #1 (Gluteus) Wound Laterality: Left Cleanser Soap and Water Discharge Instruction: Gently cleanse wound with antibacterial soap, rinse and pat dry prior to dressing wounds Peri-Wound Care Topical Primary Dressing Xeroform-HBD 2x2 (in/in) Discharge Instruction: Apply Xeroform-HBD 2x2 (in/in) as directed Secondary Dressing (BORDER) Zetuvit Plus SILICONE BORDER Dressing 4x4 (in/in) Discharge  Instruction: Please do not put silicone bordered dressings under wraps. Use non-bordered dressing only. Secured With Compression Wrap Compression Stockings Facilities manager) Signed: 04/09/2023 4:19:38 PM By:  Midge Aver MSN RN CNS WTA Entered By: Midge Aver on 04/09/2023 08:47:14 -------------------------------------------------------------------------------- Vitals Details Patient Name: Date of Service: [name], [place] RY [name]. 04/09/2023 8:30 [name] M Medical Record Number: [id number] Patient Account Number: [id number] Date of Birth/Sex: Treating RN: [birth year] [name] (82 y.o. [name] Primary Care Abbott Jasinski: Sallyanne Kuster Other Clinician: Referring Guss Farruggia: Treating Humza Tallerico/Extender: Weldon Inches Weeks in Treatment: 2 Vital Signs Time Taken: 08:42 Temperature (F): 97.5 Height (in): 58 Pulse (bpm): 101 Weight (lbs): 152 Respiratory Rate (breaths/min): 18 Body Mass Index (BMI): 31.8 Blood Pressure (mmHg): 112/64 Reference Range: 80 - 120 mg / dl ADELMA, PURNELL [name] ([id number]) [id number] Page 10 of 10 Electronic Signature(s) Signed: 04/09/2023 4:19:38 PM By: Midge Aver MSN RN CNS WTA Entered By: Midge Aver on 04/09/2023 08:42:17

## 2023-04-09 NOTE — Progress Notes (Signed)
JAZZELL, FARD [name] ([id number]) [id number] Page 1 of 7 Visit Report for 04/09/2023 Chief Complaint Document Details Patient Name: Date of Service: [name], [name]. 04/09/2023 8:30 [name] M Medical Record Number: [id number] Patient Account Number: [id number] Date of Birth/Sex: Treating RN: 1941-02-23 (82 y.o. [name] Primary Care Provider: Sallyanne Kuster Other Clinician: Referring Provider: Treating Provider/Extender: Weldon Inches Weeks in Treatment: 2 Information Obtained from: Patient Chief Complaint Left gluteal pressure ulcer Electronic Signature(s) Signed: 04/09/2023 8:56:47 AM By: Allen Derry PA-C Entered By: Allen Derry on 04/09/2023 08:56:47 -------------------------------------------------------------------------------- HPI Details Patient Name: Date of Service: [name], [name]. 04/09/2023 8:30 [name] M Medical Record Number: [id number] Patient Account Number: [id number] Date of Birth/Sex: Treating RN: 05-09-1941 (82 y.o. [name] Primary Care Provider: Sallyanne Kuster Other Clinician: Referring Provider: Treating Provider/Extender: Weldon Inches Weeks in Treatment: 2 History of Present Illness HPI Description: 03-26-2023 upon evaluation today patient presents for initial evaluation here in the clinic concerning [name] wound over the left gluteal region this is [name] stage II pressure ulcer. Fortunately it does not appear to be too deep at all which is great news and very pleased in that regard. The patient is on Coumadin she also has been using mupirocin topically and has doxycycline of which she has 1 week left. The good news is I think the infection looks to be doing much better. With that being said she does spend most of her time in [name] recliner and I think that is where [name] lot of the pressure issues coming from. Patient does have [name] history of diabetes mellitus type 2, she uses insulin for control, hypertension, COPD,  chronic kidney disease stage IV, congestive heart failure, long-term use of anticoagulant therapy due to atrial fibrillation, this specifically his Coumadin, she is legally blind, and is [name] current smoker. 04-03-2023 upon evaluation today patient appears to be doing well currently in regard to her wound. This is actually showing signs of excellent improvement. Fortunately there does not appear to be any signs of infection locally nor systemically at this time. With that being said the patient does seem to be making some good progress here in regard to left gluteal ulcer. She does have [name] rash on the right gluteal region. This is something that tends to come and go about once [name] year. This has been going on for [name] number of years. 04-09-2023 upon evaluation today patient appears to be doing well currently with regard to her wounds. The area of shingles actually appears to be completely dry and healed up. The area where she has more drainage with regard to the primary wound also appears like it could be healed although not 100% sure on this when I would like to monitor for 1 more week before calling it completely healed. Fortunately I do not see any signs of active infection locally nor systemically which is great news. [name], [name] ([id number]) [id number] Page 2 of 7 Electronic Signature(s) Signed: 04/09/2023 8:58:22 AM By: Allen Derry PA-C Entered By: Allen Derry on 04/09/2023 08:58:22 -------------------------------------------------------------------------------- Physical Exam Details Patient Name: Date of Service: [name], [name]. 04/09/2023 8:30 [name] M Medical Record Number: [id number] Patient Account Number: [id number] Date of Birth/Sex: Treating RN: [birth year] (82 y.o. [name] Primary Care Provider: Sallyanne Kuster Other Clinician: Referring Provider: Treating Provider/Extender: Weldon Inches Weeks in Treatment:  2 Constitutional Well-nourished and well-hydrated in no acute distress. Respiratory normal breathing without difficulty. Psychiatric this patient is  able to make decisions and demonstrates good insight into disease process. Alert and Oriented x 3. pleasant and cooperative. Notes Upon inspection patient's wound bed actually showed signs of good granulation epithelization at this point. Fortunately there does not appear to be any evidence of infection locally nor systemically which is great news and in general I do believe that we are moving in the right direction here. Electronic Signature(s) Signed: 04/09/2023 8:58:38 AM By: Allen Derry PA-C Entered By: Allen Derry on 04/09/2023 08:58:38 -------------------------------------------------------------------------------- Physician Orders Details Patient Name: Date of Service: [name], [name]. 04/09/2023 8:30 [name] M Medical Record Number: [id number] Patient Account Number: [id number] Date of Birth/Sex: Treating RN: June 11, 1941 (82 y.o. [name] Primary Care Provider: Sallyanne Kuster Other Clinician: Referring Provider: Treating Provider/Extender: Weldon Inches Weeks in Treatment: 2 Verbal / Phone Orders: No Diagnosis Coding Follow-up Appointments Return Appointment in 1 week. [name], [name] ([id number]) [id number] Page 3 of 7 Bathing/ [employer] wounds with antibacterial soap and water. Medications-Please add to medication list. ntibiotics - please pick up and start medication being prescribed and ointment for right glute issue P.O. [name] ntibiotic - please pick up and start medication being prescribed and ointment for right glute issue. You may apply ointment every other day Topical [name] and keep covered. Wound Treatment Wound #1 - Gluteus Wound Laterality: Left Cleanser: Soap and Water Every Other Day/30 Days Discharge Instructions: Gently cleanse wound with antibacterial soap,  rinse and pat dry prior to dressing wounds Prim Dressing: Xeroform-HBD 2x2 (in/in) (Generic) Every Other Day/30 Days ary Discharge Instructions: Apply Xeroform-HBD 2x2 (in/in) as directed Secondary Dressing: (BORDER) Zetuvit Plus SILICONE BORDER Dressing 4x4 (in/in) (Generic) Every Other Day/30 Days Discharge Instructions: Please do not put silicone bordered dressings under wraps. Use non-bordered dressing only. Electronic Signature(s) Signed: 04/09/2023 3:37:18 PM By: Allen Derry PA-C Signed: 04/09/2023 4:19:38 PM By: Midge Aver MSN RN CNS WTA Entered By: Midge Aver on 04/09/2023 08:54:07 -------------------------------------------------------------------------------- Problem List Details Patient Name: Date of Service: [name], [name]. 04/09/2023 8:30 [name] M Medical Record Number: [id number] Patient Account Number: [id number] Date of Birth/Sex: Treating RN: 07-19-1941 (82 y.o. [name] Primary Care Provider: Sallyanne Kuster Other Clinician: Referring Provider: Treating Provider/Extender: Weldon Inches Weeks in Treatment: 2 Active Problems ICD-10 Encounter Code Description Active Date MDM Diagnosis [phone] Pressure ulcer of left buttock, stage 2 03/26/2023 No Yes E11.622 Type 2 diabetes mellitus with other skin ulcer 03/26/2023 No Yes Z79.4 Long term (current) use of insulin 03/26/2023 No Yes B02.9 Zoster without complications 04/03/2023 No Yes I10 Essential (primary) hypertension 03/26/2023 No Yes J44.9 Chronic obstructive pulmonary disease, unspecified 03/26/2023 No Yes CRISELDA, DODA [name] ([id number]) [id number] Page 4 of 7 N18.4 Chronic kidney disease, stage 4 (severe) 03/26/2023 No Yes I50.42 Chronic combined systolic (congestive) and diastolic (congestive) heart failure 03/26/2023 No Yes Z79.01 Long term (current) use of anticoagulants 03/26/2023 No Yes I48.0 Paroxysmal atrial fibrillation 03/26/2023 No Yes H54.8 Legal blindness, as defined in  [place] 03/26/2023 No Yes F17.218 Nicotine dependence, cigarettes, with other nicotine-induced disorders 03/26/2023 No Yes Inactive Problems Resolved Problems Electronic Signature(s) Signed: 04/09/2023 8:56:44 AM By: Allen Derry PA-C Entered By: Allen Derry on 04/09/2023 08:56:44 -------------------------------------------------------------------------------- Progress Note Details Patient Name: Date of Service: [name], [name]. 04/09/2023 8:30 [name] M Medical Record Number: [id number] Patient Account Number: [id number] Date of Birth/Sex: Treating RN: October 01, 1941 (82 y.o. [name] Primary Care Provider: Sallyanne Kuster Other Clinician: Referring Provider: Treating Provider/Extender: Allen Derry  Sallyanne Kuster Weeks in Treatment: 2 Subjective Chief Complaint Information obtained from Patient Left gluteal pressure ulcer History of Present Illness (HPI) 03-26-2023 upon evaluation today patient presents for initial evaluation here in the clinic concerning [name] wound over the left gluteal region this is [name] stage II pressure ulcer. Fortunately it does not appear to be too deep at all which is great news and very pleased in that regard. The patient is on Coumadin she also has been using mupirocin topically and has doxycycline of which she has 1 week left. The good news is I think the infection looks to be doing much better. With that being said she does spend most of her time in [name] recliner and I think that is where [name] lot of the pressure issues coming from. Patient does have [name] history of diabetes mellitus type 2, she uses insulin for control, hypertension, COPD, chronic kidney disease stage IV, congestive heart failure, long-term use of anticoagulant therapy due to atrial fibrillation, this specifically his Coumadin, she is legally blind, and is [name] current smoker. 04-03-2023 upon evaluation today patient appears to be doing well currently in regard to her wound. This is actually showing signs of excellent  improvement. Fortunately there does not appear to be any signs of infection locally nor systemically at this time. With that being said the patient does seem to be making some good progress here in regard to left gluteal ulcer. She does have [name] rash on the right gluteal region. This is something that tends to come and go about once [name] year. This has been going on for [name] number of years. [name], [name] ([id number]) [id number] Page 5 of 7 04-09-2023 upon evaluation today patient appears to be doing well currently with regard to her wounds. The area of shingles actually appears to be completely dry and healed up. The area where she has more drainage with regard to the primary wound also appears like it could be healed although not 100% sure on this when I would like to monitor for 1 more week before calling it completely healed. Fortunately I do not see any signs of active infection locally nor systemically which is great news. Objective Constitutional Well-nourished and well-hydrated in no acute distress. Vitals Time Taken: 8:42 AM, Height: 58 in, Weight: 152 lbs, BMI: 31.8, Temperature: 97.5 F, Pulse: 101 bpm, Respiratory Rate: 18 breaths/min, Blood Pressure: 112/64 mmHg. Respiratory normal breathing without difficulty. Psychiatric this patient is able to make decisions and demonstrates good insight into disease process. Alert and Oriented x 3. pleasant and cooperative. General Notes: Upon inspection patient's wound bed actually showed signs of good granulation epithelization at this point. Fortunately there does not appear to be any evidence of infection locally nor systemically which is great news and in general I do believe that we are moving in the right direction here. Integumentary (Hair, Skin) Wound #1 status is Open. Original cause of wound was Pressure Injury. The date acquired was: 02/23/2023. The wound has been in treatment 2 weeks. The wound is located on the  Left Gluteus. The wound measures 0.5cm length x 0.5cm width x 0.1cm depth; 0.196cm^2 area and 0.02cm^3 volume. There is Fat Layer (Subcutaneous Tissue) exposed. There is [name] medium amount of serosanguineous drainage noted. There is small (1-33%) pink granulation within the wound bed. There is [name] large (67-100%) amount of necrotic tissue within the wound bed including Adherent Slough. Assessment Active Problems ICD-10 Pressure ulcer of left buttock, stage 2 Type 2 diabetes mellitus with other  skin ulcer Long term (current) use of insulin Zoster without complications Essential (primary) hypertension Chronic obstructive pulmonary disease, unspecified Chronic kidney disease, stage 4 (severe) Chronic combined systolic (congestive) and diastolic (congestive) heart failure Long term (current) use of anticoagulants Paroxysmal atrial fibrillation Legal blindness, as defined in [place] Nicotine dependence, cigarettes, with other nicotine-induced disorders Plan Follow-up Appointments: Return Appointment in 1 week. Bathing/ Shower/ Hygiene: Wash wounds with antibacterial soap and water. Medications-Please add to medication list.: P.O. Antibiotics - please pick up and start medication being prescribed and ointment for right glute issue Topical Antibiotic - please pick up and start medication being prescribed and ointment for right glute issue. You may apply ointment every other day and keep covered. WOUND #1: - Gluteus Wound Laterality: Left Cleanser: Soap and Water Every Other Day/30 Days Discharge Instructions: Gently cleanse wound with antibacterial soap, rinse and pat dry prior to dressing wounds Prim Dressing: Xeroform-HBD 2x2 (in/in) (Generic) Every Other Day/30 Days ary Discharge Instructions: Apply Xeroform-HBD 2x2 (in/in) as directed Secondary Dressing: (BORDER) Zetuvit Plus SILICONE BORDER Dressing 4x4 (in/in) (Generic) Every Other Day/30 Days Discharge Instructions: Please do not put  silicone bordered dressings under wraps. Use non-bordered dressing only. 1. I am going to suggest that we have the patient continue to monitor for any signs of infection or worsening in general I do believe that we are making decent progress here. [name], [name] ([id number]) [id number] Page 6 of 7 2. I am going to recommend as well that the patient should continue to monitor for any evidence of worsening although I think things are actually tending towards trying and seem to be doing quite well. 3. Will continue with the Xeroform gauze followed by bordered foam dressing over the left gluteal region and she is in agreement with that plan over the next week if everything is healed then we will discontinue at that point. We will see patient back for reevaluation in 1 week here in the clinic. If anything worsens or changes patient will contact our office for additional recommendations. Electronic Signature(s) Signed: 04/09/2023 8:59:48 AM By: Allen Derry PA-C Entered By: Allen Derry on 04/09/2023 08:59:48 -------------------------------------------------------------------------------- SuperBill Details Patient Name: Date of Service: [name], [name]. 04/09/2023 Medical Record Number: [id number] Patient Account Number: [id number] Date of Birth/Sex: Treating RN: 1941-09-04 (82 y.o. [name] Primary Care Provider: Sallyanne Kuster Other Clinician: Referring Provider: Treating Provider/Extender: Weldon Inches Weeks in Treatment: 2 Diagnosis Coding ICD-10 Codes Code Description [phone] Pressure ulcer of left buttock, stage 2 E11.622 Type 2 diabetes mellitus with other skin ulcer Z79.4 Long term (current) use of insulin B02.9 Zoster without complications I10 Essential (primary) hypertension J44.9 Chronic obstructive pulmonary disease, unspecified N18.4 Chronic kidney disease, stage 4 (severe) I50.42 Chronic combined systolic (congestive) and  diastolic (congestive) heart failure Z79.01 Long term (current) use of anticoagulants I48.0 Paroxysmal atrial fibrillation H54.8 Legal blindness, as defined in [place] F17.218 Nicotine dependence, cigarettes, with other nicotine-induced disorders Facility Procedures : CPT4 Code: [id number] Description: 99213 - WOUND CARE VISIT-LEV 3 EST PT Modifier: Quantity: 1 Physician Procedures : CPT4 Code Description Modifier [id number] - WC PHYS LEVEL 3 - EST PT ICD-10 Diagnosis Description L89.322 Pressure ulcer of left buttock, stage 2 E11.622 Type 2 diabetes mellitus with other skin ulcer Z79.4 Long term (current) use of insulin B02.9  Zoster without complications Quantity: 1 Electronic Signature(s) Signed: 04/09/2023 9:00:00 AM By: Emilio Aspen, Blondina [name] ([id number]) [id number] Page 7 of 7 Entered By: Allen Derry on 04/09/2023  09:00:00 

## 2023-04-16 ENCOUNTER — Encounter: Payer: Medicare HMO | Admitting: Physician Assistant

## 2023-04-16 ENCOUNTER — Telehealth: Payer: Self-pay | Admitting: Nurse Practitioner

## 2023-04-16 DIAGNOSIS — S80811A Abrasion, right lower leg, initial encounter: Secondary | ICD-10-CM | POA: Diagnosis not present

## 2023-04-16 DIAGNOSIS — I130 Hypertensive heart and chronic kidney disease with heart failure and stage 1 through stage 4 chronic kidney disease, or unspecified chronic kidney disease: Secondary | ICD-10-CM | POA: Diagnosis not present

## 2023-04-16 DIAGNOSIS — E11622 Type 2 diabetes mellitus with other skin ulcer: Secondary | ICD-10-CM | POA: Diagnosis not present

## 2023-04-16 DIAGNOSIS — I5042 Chronic combined systolic (congestive) and diastolic (congestive) heart failure: Secondary | ICD-10-CM | POA: Diagnosis not present

## 2023-04-16 DIAGNOSIS — I480 Paroxysmal atrial fibrillation: Secondary | ICD-10-CM | POA: Diagnosis not present

## 2023-04-16 DIAGNOSIS — F17218 Nicotine dependence, cigarettes, with other nicotine-induced disorders: Secondary | ICD-10-CM | POA: Diagnosis not present

## 2023-04-16 DIAGNOSIS — L89322 Pressure ulcer of left buttock, stage 2: Secondary | ICD-10-CM | POA: Diagnosis not present

## 2023-04-16 DIAGNOSIS — J449 Chronic obstructive pulmonary disease, unspecified: Secondary | ICD-10-CM | POA: Diagnosis not present

## 2023-04-16 DIAGNOSIS — N184 Chronic kidney disease, stage 4 (severe): Secondary | ICD-10-CM | POA: Diagnosis not present

## 2023-04-16 DIAGNOSIS — E1122 Type 2 diabetes mellitus with diabetic chronic kidney disease: Secondary | ICD-10-CM | POA: Diagnosis not present

## 2023-04-16 NOTE — Progress Notes (Addendum)
VONYA, OHALLORAN [name] ([id number]) [id number] Page 1 of 7 Visit Report for 04/16/2023 Chief Complaint Document Details Patient Name: Date of Service: [name], [name]. 04/16/2023 3:30 PM Medical Record Number: [id number] Patient Account Number: [id number] Date of Birth/Sex: Treating RN: 19-Sep-1941 (82 y.o. [name] Primary Care Provider: Sallyanne Kuster Other Clinician: Referring Provider: Treating Provider/Extender: Weldon Inches Weeks in Treatment: 3 Information Obtained from: Patient Chief Complaint Left gluteal pressure ulcer Electronic Signature(s) Signed: 04/16/2023 3:21:26 PM By: Allen Derry PA-C Entered By: Allen Derry on 04/16/2023 15:21:26 -------------------------------------------------------------------------------- HPI Details Patient Name: Date of Service: Amy Keel, Amy RY [name]. 04/16/2023 3:30 PM Medical Record Number: [id number] Patient Account Number: [id number] Date of Birth/Sex: Treating RN: April 29, 1941 (82 y.o. [name] Primary Care Provider: Sallyanne Kuster Other Clinician: Referring Provider: Treating Provider/Extender: Weldon Inches Weeks in Treatment: 3 History of Present Illness HPI Description: 03-26-2023 upon evaluation today patient presents for initial evaluation here in the clinic concerning [name] wound over the left gluteal region this is [name] stage II pressure ulcer. Fortunately it does not appear to be too deep at all which is great news and very pleased in that regard. The patient is on Coumadin she also has been using mupirocin topically and has doxycycline of which she has 1 week left. The good news is I think the infection looks to be doing much better. With that being said she does spend most of her time in [name] recliner and I think that is where [name] lot of the pressure issues coming from. Patient does have [name] history of diabetes mellitus type 2, she uses insulin for control, hypertension, COPD,  chronic kidney disease stage IV, congestive heart failure, long-term use of anticoagulant therapy due to atrial fibrillation, this specifically his Coumadin, she is legally blind, and is [name] current smoker. 04-03-2023 upon evaluation today patient appears to be doing well currently in regard to her wound. This is actually showing signs of excellent improvement. Fortunately there does not appear to be any signs of infection locally nor systemically at this time. With that being said the patient does seem to be making some good progress here in regard to left gluteal ulcer. She does have [name] rash on the right gluteal region. This is something that tends to come and go about once [name] year. This has been going on for [name] number of years. 04-09-2023 upon evaluation today patient appears to be doing well currently with regard to her wounds. The area of shingles actually appears to be completely dry and healed up. The area where she has more drainage with regard to the primary wound also appears like it could be healed although not 100% sure on this when I would like to monitor for 1 more week before calling it completely healed. Fortunately I do not see any signs of active infection locally nor systemically which is great news. [name], [name] ([id number]) [id number] Page 2 of 7 04-16-2023 upon evaluation today patient appears to be doing excellent currently in regard to her original wound which is good on the gluteal region this is completely healed. Electronic Signature(s) Signed: 04/16/2023 5:43:25 PM By: Allen Derry PA-C Entered By: Allen Derry on 04/16/2023 17:43:25 -------------------------------------------------------------------------------- Physical Exam Details Patient Name: Date of Service: [name], Amy RY [name]. 04/16/2023 3:30 PM Medical Record Number: [id number] Patient Account Number: [id number] Date of Birth/Sex: Treating RN: January 26, 1941 (82 y.o. [name] Primary Care  Provider: Sallyanne Kuster Other Clinician: Referring Provider: Treating Provider/Extender:  Stone, Akia Montalban Abernathy, Alyssa Weeks in Treatment: 3 Constitutional Well-nourished and well-hydrated in no acute distress. Respiratory normal breathing without difficulty. Psychiatric this patient is able to make decisions and demonstrates good insight into disease process. Alert and Oriented x 3. pleasant and cooperative. Notes Upon inspection patient's wound bed actually showed signs of unfortunately new area in the abdomen where it appears something has been rubbing and causing some issues here and therefore this is actually showing signs of breaking down. Fortunately I do not see any evidence of infection which is good news I would recommend [name] Xeroform and bordered foam dressing is the best option here. Electronic Signature(s) Signed: 04/16/2023 5:43:46 PM By: Allen Derry PA-C Entered By: Allen Derry on 04/16/2023 17:43:46 -------------------------------------------------------------------------------- Physician Orders Details Patient Name: Date of Service: Amy Keel, Amy RY [name]. 04/16/2023 3:30 PM Medical Record Number: [id number] Patient Account Number: [id number] Date of Birth/Sex: Treating RN: [birth year] (82 y.o. [name] Primary Care Provider: Sallyanne Kuster Other Clinician: Referring Provider: Treating Provider/Extender: Weldon Inches Weeks in Treatment: 3 Verbal / Phone Orders: No Diagnosis Coding [name], [name] ([id number]) [id number] Page 3 of 7 ICD-10 Coding Code Description [phone] Pressure ulcer of left buttock, stage 2 E11.622 Type 2 diabetes mellitus with other skin ulcer Z79.4 Long term (current) use of insulin B02.9 Zoster without complications I10 Essential (primary) hypertension J44.9 Chronic obstructive pulmonary disease, unspecified N18.4 Chronic kidney disease, stage 4 (severe) I50.42 Chronic combined systolic  (congestive) and diastolic (congestive) heart failure Z79.01 Long term (current) use of anticoagulants I48.0 Paroxysmal atrial fibrillation H54.8 Legal blindness, as defined in [place] F17.218 Nicotine dependence, cigarettes, with other nicotine-induced disorders Follow-up Appointments Return Appointment in 1 week. Bathing/ [employer] wounds with antibacterial soap and water. Wound Treatment Wound #2 - Abdomen - Lower Quadrant Wound Laterality: Right Cleanser: Soap and Water Every Other Day/30 Days Discharge Instructions: Gently cleanse wound with antibacterial soap, rinse and pat dry prior to dressing wounds Prim Dressing: Xeroform-HBD 2x2 (in/in) Every Other Day/30 Days ary Discharge Instructions: Apply Xeroform-HBD 2x2 (in/in) as directed Secondary Dressing: (BORDER) Zetuvit Plus SILICONE BORDER Dressing 4x4 (in/in) Every Other Day/30 Days Discharge Instructions: Please do not put silicone bordered dressings under wraps. Use non-bordered dressing only. Electronic Signature(s) Signed: 04/16/2023 5:02:38 PM By: Midge Aver MSN RN CNS WTA Signed: 04/16/2023 5:56:33 PM By: Allen Derry PA-C Entered By: Midge Aver on 04/16/2023 17:02:38 -------------------------------------------------------------------------------- Problem List Details Patient Name: Date of Service: Amy Keel, Amy RY [name]. 04/16/2023 3:30 PM Medical Record Number: [id number] Patient Account Number: [id number] Date of Birth/Sex: Treating RN: [birth year] (82 y.o. [name] Primary Care Provider: Sallyanne Kuster Other Clinician: Referring Provider: Treating Provider/Extender: Weldon Inches Weeks in Treatment: 3 Active Problems ICD-10 Encounter Code Description Active Date MDM Diagnosis [phone] Pressure ulcer of left buttock, stage 2 03/26/2023 No Yes E11.622 Type 2 diabetes mellitus with other skin ulcer 03/26/2023 No Yes [name], [name] ([id number]) [id number] Page 4  of 7 Z79.4 Long term (current) use of insulin 03/26/2023 No Yes B02.9 Zoster without complications 04/03/2023 No Yes I10 Essential (primary) hypertension 03/26/2023 No Yes J44.9 Chronic obstructive pulmonary disease, unspecified 03/26/2023 No Yes N18.4 Chronic kidney disease, stage 4 (severe) 03/26/2023 No Yes I50.42 Chronic combined systolic (congestive) and diastolic (congestive) heart failure 03/26/2023 No Yes Z79.01 Long term (current) use of anticoagulants 03/26/2023 No Yes I48.0 Paroxysmal atrial fibrillation 03/26/2023 No Yes H54.8 Legal blindness, as defined in [place] 03/26/2023 No Yes F17.218 Nicotine dependence, cigarettes, with other nicotine-induced disorders  03/26/2023 No Yes Inactive Problems Resolved Problems Electronic Signature(s) Signed: 04/16/2023 3:21:19 PM By: Allen Derry PA-C Entered By: Allen Derry on 04/16/2023 15:21:19 -------------------------------------------------------------------------------- Progress Note Details Patient Name: Date of Service: Amy Keel, Amy RY [name]. 04/16/2023 3:30 PM Medical Record Number: [id number] Patient Account Number: [id number] Date of Birth/Sex: Treating RN: Jul 23, 1941 (82 y.o. [name] Primary Care Provider: Sallyanne Kuster Other Clinician: Referring Provider: Treating Provider/Extender: Weldon Inches Weeks in Treatment: 3 [name], [name] ([id number]) [id number] Page 5 of 7 Subjective Chief Complaint Information obtained from Patient Left gluteal pressure ulcer History of Present Illness (HPI) 03-26-2023 upon evaluation today patient presents for initial evaluation here in the clinic concerning [name] wound over the left gluteal region this is [name] stage II pressure ulcer. Fortunately it does not appear to be too deep at all which is great news and very pleased in that regard. The patient is on Coumadin she also has been using mupirocin topically and has doxycycline of which she has 1 week left. The good news  is I think the infection looks to be doing much better. With that being said she does spend most of her time in [name] recliner and I think that is where [name] lot of the pressure issues coming from. Patient does have [name] history of diabetes mellitus type 2, she uses insulin for control, hypertension, COPD, chronic kidney disease stage IV, congestive heart failure, long-term use of anticoagulant therapy due to atrial fibrillation, this specifically his Coumadin, she is legally blind, and is [name] current smoker. 04-03-2023 upon evaluation today patient appears to be doing well currently in regard to her wound. This is actually showing signs of excellent improvement. Fortunately there does not appear to be any signs of infection locally nor systemically at this time. With that being said the patient does seem to be making some good progress here in regard to left gluteal ulcer. She does have [name] rash on the right gluteal region. This is something that tends to come and go about once [name] year. This has been going on for [name] number of years. 04-09-2023 upon evaluation today patient appears to be doing well currently with regard to her wounds. The area of shingles actually appears to be completely dry and healed up. The area where she has more drainage with regard to the primary wound also appears like it could be healed although not 100% sure on this when I would like to monitor for 1 more week before calling it completely healed. Fortunately I do not see any signs of active infection locally nor systemically which is great news. 04-16-2023 upon evaluation today patient appears to be doing excellent currently in regard to her original wound which is good on the gluteal region this is completely healed. Objective Constitutional Well-nourished and well-hydrated in no acute distress. Vitals Time Taken: 3:46 PM, Height: 58 in, Weight: 152 lbs, BMI: 31.8, Temperature: 97.6 F, Pulse: 98 bpm, Respiratory Rate: 18 breaths/min, Blood  Pressure: 108/61 mmHg. Respiratory normal breathing without difficulty. Psychiatric this patient is able to make decisions and demonstrates good insight into disease process. Alert and Oriented x 3. pleasant and cooperative. General Notes: Upon inspection patient's wound bed actually showed signs of unfortunately new area in the abdomen where it appears something has been rubbing and causing some issues here and therefore this is actually showing signs of breaking down. Fortunately I do not see any evidence of infection which is good news I would recommend [name] Xeroform and bordered foam  dressing is the best option here. Integumentary (Hair, Skin) Wound #1 status is Healed - Epithelialized. Original cause of wound was Pressure Injury. The date acquired was: 02/23/2023. The wound has been in treatment 3 weeks. The wound is located on the Left Gluteus. The wound measures 0cm length x 0cm width x 0cm depth; 0cm^2 area and 0cm^3 volume. There is Fat Layer (Subcutaneous Tissue) exposed. There is [name] medium amount of serosanguineous drainage noted. There is small (1-33%) pink granulation within the wound bed. There is [name] large (67-100%) amount of necrotic tissue within the wound bed. Wound #2 status is Open. Original cause of wound was Gradually Appeared. The date acquired was: 04/15/2023. The wound is located on the Right Abdomen - Lower Quadrant. The wound measures 1cm length x 0.7cm width x 0.2cm depth; 0.55cm^2 area and 0.11cm^3 volume. There is Fat Layer (Subcutaneous Tissue) exposed. There is [name] medium amount of serosanguineous drainage noted. There is small (1-33%) pink granulation within the wound bed. There is [name] medium (34-66%) amount of necrotic tissue within the wound bed including Adherent Slough. Assessment Active Problems ICD-10 Pressure ulcer of left buttock, stage 2 Type 2 diabetes mellitus with other skin ulcer Long term (current) use of insulin Zoster without complications Essential  (primary) hypertension Chronic obstructive pulmonary disease, unspecified Chronic kidney disease, stage 4 (severe) Chronic combined systolic (congestive) and diastolic (congestive) heart failure Long term (current) use of anticoagulants Paroxysmal atrial fibrillation Legal blindness, as defined in [place] Nicotine dependence, cigarettes, with other nicotine-induced disorders [name], [name] ([id number]) [id number] Page 6 of 7 Plan Follow-up Appointments: Return Appointment in 1 week. Bathing/ Shower/ Hygiene: Wash wounds with antibacterial soap and water. WOUND #2: - Abdomen - Lower Quadrant Wound Laterality: Right Cleanser: Soap and Water Every Other Day/30 Days Discharge Instructions: Gently cleanse wound with antibacterial soap, rinse and pat dry prior to dressing wounds Prim Dressing: Xeroform-HBD 2x2 (in/in) Every Other Day/30 Days ary Discharge Instructions: Apply Xeroform-HBD 2x2 (in/in) as directed Secondary Dressing: (BORDER) Zetuvit Plus SILICONE BORDER Dressing 4x4 (in/in) Every Other Day/30 Days Discharge Instructions: Please do not put silicone bordered dressings under wraps. Use non-bordered dressing only. 1. I am good recommend currently that the patient continue to monitor for any evidence of infection or worsening in general I do believe that we are moving in the right direction currently. 2. I am also going to suggest that the patient should continue to utilize the Xeroform gauze along with the bordered foam dressing to cover on the abdominal area that this is done well for the other site and hopefully will continue to do well here as well. We will see patient back for reevaluation in 1 week here in the clinic. If anything worsens or changes patient will contact our office for additional recommendations. Electronic Signature(s) Signed: 04/16/2023 5:44:33 PM By: Allen Derry PA-C Entered By: Allen Derry on 04/16/2023  17:44:33 -------------------------------------------------------------------------------- SuperBill Details Patient Name: Date of Service: Amy Keel, Amy RY [name]. 04/16/2023 Medical Record Number: [id number] Patient Account Number: [id number] Date of Birth/Sex: Treating RN: 09/27/1941 (82 y.o. [name] Primary Care Provider: Sallyanne Kuster Other Clinician: Referring Provider: Treating Provider/Extender: Weldon Inches Weeks in Treatment: 3 Diagnosis Coding ICD-10 Codes Code Description [phone] Pressure ulcer of left buttock, stage 2 E11.622 Type 2 diabetes mellitus with other skin ulcer Z79.4 Long term (current) use of insulin B02.9 Zoster without complications I10 Essential (primary) hypertension J44.9 Chronic obstructive pulmonary disease, unspecified N18.4 Chronic kidney disease, stage 4 (severe) I50.42 Chronic combined systolic (  congestive) and diastolic (congestive) heart failure Z79.01 Long term (current) use of anticoagulants I48.0 Paroxysmal atrial fibrillation H54.8 Legal blindness, as defined in [place] F17.218 Nicotine dependence, cigarettes, with other nicotine-induced disorders EMONII, WIENKE [name] ([id number]) [id number] Page 7 of 7 Facility Procedures : CPT4 Code: [id number] Description: 99213 - WOUND CARE VISIT-LEV 3 EST PT Modifier: Quantity: 1 Physician Procedures : CPT4 Code Description Modifier [id number] 99213 - WC PHYS LEVEL 3 - EST PT ICD-10 Diagnosis Description L89.322 Pressure ulcer of left buttock, stage 2 E11.622 Type 2 diabetes mellitus with other skin ulcer Z79.4 Long term (current) use of insulin B02.9  Zoster without complications Quantity: 1 Electronic Signature(s) Signed: 04/16/2023 5:45:00 PM By: Allen Derry PA-C Previous Signature: 04/16/2023 5:03:18 PM Version By: Midge Aver MSN RN CNS WTA Entered By: Allen Derry on 04/16/2023 17:45:00

## 2023-04-16 NOTE — Telephone Encounter (Signed)
Lvm notifying daughter that Day Program form is completed, been faxed, Scanned-Toni

## 2023-04-16 NOTE — Progress Notes (Addendum)
Amy, LEINWEBER [name] ([id number]) [id number] Page 1 of 11 Visit Report for 04/16/2023 Arrival Information Details Patient Name: Date of Service: [name], [name]. 04/16/2023 3:30 PM Medical Record Number: [id number] Patient Account Number: [id number] Date of Birth/Sex: Treating RN: 09/27/1941 (82 y.o. [name] Primary Care Bradyn Vassey: Sallyanne Kuster Other Clinician: Referring Lacreasha Hinds: Treating Khamari Sheehan/Extender: Weldon Inches Weeks in Treatment: 3 Visit Information History Since Last Visit Added or deleted any medications: No Patient Arrived: Walker Has Dressing in Place as Prescribed: Yes Arrival Time: 15:43 Pain Present Now: No Accompanied By: daughter Transfer Assistance: None Patient Identification Verified: Yes Secondary Verification Process Completed: Yes Patient Requires Transmission-Based Precautions: No Patient Has Alerts: Yes Patient Alerts: Patient on Blood Thinner Diabetes type 2 Coumadin Electronic Signature(s) Signed: 04/16/2023 5:22:58 PM By: Midge Aver MSN RN CNS WTA Entered By: Midge Aver on 04/16/2023 15:44:30 -------------------------------------------------------------------------------- Clinic Level of Care Assessment Details Patient Name: Date of Service: [facility], [name]. 04/16/2023 3:30 PM Medical Record Number: [id number] Patient Account Number: [id number] Date of Birth/Sex: Treating RN: December 15, 1940 (82 y.o. [name] Primary Care Delisia Mcquiston: Sallyanne Kuster Other Clinician: Referring Dwaine Pringle: Treating Hollace Michelli/Extender: Weldon Inches Weeks in Treatment: 3 Clinic Level of Care Assessment Items TOOL 4 Quantity Score X- 1 0 Use when only an EandM is performed on FOLLOW-UP visit ASSESSMENTS - Nursing Assessment / Reassessment X- 1 10 Reassessment of Co-morbidities (includes updates in patient status) X- 1 5 Reassessment of Adherence to Treatment Plan ASSESSMENTS - Wound and Skin  [name] ssessment / Reassessment X - Simple Wound Assessment / Reassessment - one wound 1 5 Shibley, Latana [name] ([id number]) [phone].pdf Page 2 of 11 []  - 0 Complex Wound Assessment / Reassessment - multiple wounds []  - 0 Dermatologic / Skin Assessment (not related to wound area) ASSESSMENTS - Focused Assessment []  - 0 Circumferential Edema Measurements - multi extremities []  - 0 Nutritional Assessment / Counseling / Intervention []  - 0 Lower Extremity Assessment (monofilament, tuning fork, pulses) []  - 0 Peripheral Arterial Disease Assessment (using hand held doppler) ASSESSMENTS - Ostomy and/or Continence Assessment and Care []  - 0 Incontinence Assessment and Management []  - 0 Ostomy Care Assessment and Management (repouching, etc.) PROCESS - Coordination of Care X - Simple Patient / Family Education for ongoing care 1 15 []  - 0 Complex (extensive) Patient / Family Education for ongoing care X- 1 10 Staff obtains Chiropractor, Records, T Results / Process Orders est []  - 0 Staff telephones HHA, Nursing Homes / Clarify orders / etc []  - 0 Routine Transfer to another Facility (non-emergent condition) []  - 0 Routine Hospital Admission (non-emergent condition) []  - 0 New Admissions / Manufacturing engineer / Ordering NPWT Apligraf, etc. , []  - 0 Emergency Hospital Admission (emergent condition) X- 1 10 Simple Discharge Coordination []  - 0 Complex (extensive) Discharge Coordination PROCESS - Special Needs []  - 0 Pediatric / Minor Patient Management []  - 0 Isolation Patient Management []  - 0 Hearing / Language / Visual special needs []  - 0 Assessment of Community assistance (transportation, D/C planning, etc.) []  - 0 Additional assistance / Altered mentation []  - 0 Support Surface(s) Assessment (bed, cushion, seat, etc.) INTERVENTIONS - Wound Cleansing / Measurement X - Simple Wound Cleansing - one wound 1 5 []  - 0 Complex Wound Cleansing - multiple  wounds X- 1 5 Wound Imaging (photographs - any number of wounds) []  - 0 Wound Tracing (instead of photographs) X- 1 5 Simple Wound Measurement - one wound []  -  0 Complex Wound Measurement - multiple wounds INTERVENTIONS - Wound Dressings X - Small Wound Dressing one or multiple wounds 1 10 []  - 0 Medium Wound Dressing one or multiple wounds []  - 0 Large Wound Dressing one or multiple wounds []  - 0 Application of Medications - topical []  - 0 Application of Medications - injection INTERVENTIONS - Miscellaneous []  - 0 External ear exam []  - 0 Specimen Collection (cultures, biopsies, blood, body fluids, etc.) PIPER, HASSEBROCK [name] ([id number]) [phone].pdf Page 3 of 11 []  - 0 Specimen(s) / Culture(s) sent or taken to Lab for analysis []  - 0 Patient Transfer (multiple staff / Michiel Sites Lift / Similar devices) []  - 0 Simple Staple / Suture removal (25 or less) []  - 0 Complex Staple / Suture removal (26 or more) []  - 0 Hypo / Hyperglycemic Management (close monitor of Blood Glucose) []  - 0 Ankle / Brachial Index (ABI) - do not check if billed separately X- 1 5 Vital Signs Has the patient been seen at the hospital within the last three years: Yes Total Score: 85 Level Of Care: New/Established - Level 3 Electronic Signature(s) Signed: 04/16/2023 5:22:58 PM By: Midge Aver MSN RN CNS WTA Entered By: Midge Aver on 04/16/2023 17:03:11 -------------------------------------------------------------------------------- Encounter Discharge Information Details Patient Name: Date of Service: Amy Keel, MA RY [name]. 04/16/2023 3:30 PM Medical Record Number: [id number] Patient Account Number: [id number] Date of Birth/Sex: Treating RN: 1941-02-19 (82 y.o. [name] Primary Care Kristan Votta: Sallyanne Kuster Other Clinician: Referring Rotunda Worden: Treating Nasha Diss/Extender: Weldon Inches Weeks in Treatment: 3 Encounter Discharge Information Items Discharge  Condition: Stable Ambulatory Status: Walker Discharge Destination: Home Transportation: Private Auto Accompanied By: daughter Schedule Follow-up Appointment: Yes Clinical Summary of Care: Electronic Signature(s) Signed: 04/16/2023 5:04:54 PM By: Midge Aver MSN RN CNS WTA Entered By: Midge Aver on 04/16/2023 17:04:54 -------------------------------------------------------------------------------- Lower Extremity Assessment Details Patient Name: Date of Service: Amy Keel, MA RY [name]. 04/16/2023 3:30 PM Medical Record Number: [id number] Patient Account Number: [id number] Date of Birth/Sex: Treating RN: 08/24/1941 (82 y.o. Doloris Hall, Eastman [name] ([id number]) [id number] Page 4 of 11 Primary Care Ayslin Kundert: Sallyanne Kuster Other Clinician: Referring Nell Schrack: Treating Zaydah Nawabi/Extender: Weldon Inches Weeks in Treatment: 3 Electronic Signature(s) Signed: 04/16/2023 5:00:59 PM By: Midge Aver MSN RN CNS WTA Entered By: Midge Aver on 04/16/2023 17:00:58 -------------------------------------------------------------------------------- Multi Wound Chart Details Patient Name: Date of Service: Amy Keel, MA RY [name]. 04/16/2023 3:30 PM Medical Record Number: [id number] Patient Account Number: [id number] Date of Birth/Sex: Treating RN: 1941/01/15 (82 y.o. [name] Primary Care Charmion Hapke: Sallyanne Kuster Other Clinician: Referring Jaemarie Hochberg: Treating Jordon Bourquin/Extender: Weldon Inches Weeks in Treatment: 3 Vital Signs Height(in): 58 Pulse(bpm): 98 Weight(lbs): 152 Blood Pressure(mmHg): 108/61 Body Mass Index(BMI): 31.8 Temperature(F): 97.6 Respiratory Rate(breaths/min): 18 [1:Photos:] [N/[name]:N/[name]] Left Gluteus Right Abdomen - Lower Quadrant N/[name] Wound Location: Pressure Injury Gradually Appeared N/[name] Wounding Event: Pressure Ulcer Abrasion N/[name] Primary Etiology: Chronic Obstructive Pulmonary Chronic Obstructive Pulmonary  N/[name] Comorbid History: Disease (COPD), Sleep Apnea, Disease (COPD), Sleep Apnea, Congestive Heart Failure, Congestive Heart Failure, Hypertension, Type II Diabetes Hypertension, Type II Diabetes 02/23/2023 04/15/2023 N/[name] Date Acquired: 3 0 N/[name] Weeks of Treatment: Healed - Epithelialized Open N/[name] Wound Status: No No N/[name] Wound Recurrence: 0x0x0 1x0.7x0.2 N/[name] Measurements L x W x D (cm) 0 0.55 N/[name] [name] (cm) : rea 0 0.11 N/[name] Volume (cm) : 97.60% N/[name] N/[name] % Reduction in Area: 97.00% N/[name] N/[name] % Reduction in Volume: Category/Stage II Full Thickness Without Exposed N/[name]  Classification: Support Structures Medium Medium N/[name] Exudate Amount: Serosanguineous Serosanguineous N/[name] Exudate Type: red, brown red, brown N/[name] Exudate Color: Small (1-33%) Small (1-33%) N/[name] Granulation Amount: Pink Pink N/[name] Granulation Quality: Large (67-100%) Medium (34-66%) N/[name] Necrotic Amount: Fat Layer (Subcutaneous Tissue): Yes Fat Layer (Subcutaneous Tissue): Yes N/[name] Exposed Structures: Fascia: No Fascia: No Tendon: No Tendon: No Muscle: No Muscle: No Joint: No Joint: No BESAN, KETCHEM [name] ([id number]) [phone].pdf Page 5 of 11 Bone: No Bone: No Medium (34-66%) None N/[name] Epithelialization: Treatment Notes Electronic Signature(s) Signed: 04/16/2023 5:01:03 PM By: Midge Aver MSN RN CNS WTA Entered By: Midge Aver on 04/16/2023 17:01:03 -------------------------------------------------------------------------------- Multi-Disciplinary Care Plan Details Patient Name: Date of Service: Amy Keel, MA RY [name]. 04/16/2023 3:30 PM Medical Record Number: [id number] Patient Account Number: [id number] Date of Birth/Sex: Treating RN: 1941/04/25 (82 y.o. [name] Primary Care Kimimila Tauzin: Sallyanne Kuster Other Clinician: Referring Sidda Humm: Treating Anahid Eskelson/Extender: Weldon Inches Weeks in Treatment: 3 Active Inactive Abuse / Safety / Falls / Self Care  Management Nursing Diagnoses: Impaired physical mobility Potential for falls Self care deficit: actual or potential Goals: Patient will remain injury free Date Initiated: 03/26/2023 Target Resolution Date: 04/25/2023 Goal Status: Active Patient/caregiver will verbalize understanding of skin care regimen Date Initiated: 03/26/2023 Target Resolution Date: 04/25/2023 Goal Status: Active Patient/caregiver will verbalize/demonstrate measure taken to improve self care Date Initiated: 03/26/2023 Target Resolution Date: 04/25/2023 Goal Status: Active Patient/caregiver will verbalize/demonstrate measures taken to prevent injury and/or falls Date Initiated: 03/26/2023 Target Resolution Date: 04/25/2023 Goal Status: Active Interventions: Assess fall risk on admission and as needed Assess: immobility, friction, shearing, incontinence upon admission and as needed Assess impairment of mobility on admission and as needed per policy Assess self care needs on admission and as needed Notes: Nutrition Nursing Diagnoses: Impaired glucose control: actual or potential Goals: Patient/caregiver agrees to and verbalizes understanding of need to use nutritional supplements and/or vitamins as prescribed Date Initiated: 03/26/2023 Target Resolution Date: 04/25/2023 Goal Status: Active Patient/caregiver verbalizes understanding of need to maintain therapeutic glucose control per primary care physician THETA, LEAF [name] ([id number]) [id number] Page 6 of 11 Date Initiated: 03/26/2023 Target Resolution Date: 04/25/2023 Goal Status: Active Patient/caregiver will maintain therapeutic glucose control Date Initiated: 03/26/2023 Target Resolution Date: 04/25/2023 Goal Status: Active Interventions: Provide education on elevated blood sugars and impact on wound healing Notes: Pressure Nursing Diagnoses: Knowledge deficit related to causes and risk factors for pressure ulcer development Knowledge deficit  related to management of pressures ulcers Potential for impaired tissue integrity related to pressure, friction, moisture, and shear Goals: Patient will remain free from development of additional pressure ulcers Date Initiated: 03/26/2023 Date Inactivated: 04/09/2023 Target Resolution Date: 04/25/2023 Goal Status: Met Patient will remain free of pressure ulcers Date Initiated: 03/26/2023 Target Resolution Date: 04/25/2023 Goal Status: Active Patient/caregiver will verbalize risk factors for pressure ulcer development Date Initiated: 03/26/2023 Target Resolution Date: 04/25/2023 Goal Status: Active Patient/caregiver will verbalize understanding of pressure ulcer management Date Initiated: 03/26/2023 Target Resolution Date: 04/25/2023 Goal Status: Active Interventions: Assess: immobility, friction, shearing, incontinence upon admission and as needed Assess offloading mechanisms upon admission and as needed Assess potential for pressure ulcer upon admission and as needed Provide education on pressure ulcers Notes: Wound/Skin Impairment Nursing Diagnoses: Impaired tissue integrity Knowledge deficit related to smoking impact on wound healing Knowledge deficit related to ulceration/compromised skin integrity Goals: Patient will demonstrate [name] reduced rate of smoking or cessation of smoking Date Initiated: 03/26/2023 Date Inactivated: 04/09/2023 Target Resolution Date: 04/26/2023 Goal Status: Met Ulcer/skin  breakdown will have [name] volume reduction of 30% by week 4 Date Initiated: 03/26/2023 Target Resolution Date: 04/25/2023 Goal Status: Active Ulcer/skin breakdown will have [name] volume reduction of 50% by week 8 Date Initiated: 03/26/2023 Target Resolution Date: 05/26/2023 Goal Status: Active Ulcer/skin breakdown will have [name] volume reduction of 80% by week 12 Date Initiated: 03/26/2023 Target Resolution Date: 06/26/2023 Goal Status: Active Interventions: Assess patient/caregiver ability to obtain necessary  supplies Assess patient/caregiver ability to perform ulcer/skin care regimen upon admission and as needed Assess ulceration(s) every visit Provide education on smoking Provide education on ulcer and skin care Treatment Activities: Skin care regimen initiated : 03/26/2023 Notes: Electronic Signature(s) Signed: 04/16/2023 5:03:44 PM By: Midge Aver MSN RN CNS Fransisco Beau, Corrie Dandy [name] ([id number]) [id number] Page 7 of 11 Entered By: Midge Aver on 04/16/2023 17:03:44 -------------------------------------------------------------------------------- Pain Assessment Details Patient Name: Date of Service: [name], [place] [facility] [name]. 04/16/2023 3:30 PM Medical Record Number: [id number] Patient Account Number: [id number] Date of Birth/Sex: Treating RN: June 07, 1941 (82 y.o. [name] Primary Care Tayquan Gassman: Sallyanne Kuster Other Clinician: Referring Willine Schwalbe: Treating Wang Granada/Extender: Weldon Inches Weeks in Treatment: 3 Active Problems Location of Pain Severity and Description of Pain Patient Has Paino No Site Locations Pain Management and Medication Current Pain Management: Electronic Signature(s) Signed: 04/16/2023 5:22:58 PM By: Midge Aver MSN RN CNS WTA Entered By: Midge Aver on 04/16/2023 15:46:44 -------------------------------------------------------------------------------- Patient/Caregiver Education Details Patient Name: Date of Service: Alvina Chou 6/24/2024andnbsp3:30 PM Medical Record Number: [id number] Patient Account Number: [id number] Date of Birth/Gender: Treating RN: 13-Dec-1940 (82 y.o. [name] Primary Care Physician: Sallyanne Kuster Other Clinician: HEYDI, SWANGO ([id number]) [id number] Page 8 of 11 Referring Physician: Treating Physician/Extender: Weldon Inches Weeks in Treatment: 3 Education Assessment Education Provided To: Patient and Caregiver Education Topics  Provided Wound/Skin Impairment: Handouts: Caring for Your Ulcer Methods: Explain/Verbal Responses: State content correctly Electronic Signature(s) Signed: 04/16/2023 5:22:58 PM By: Midge Aver MSN RN CNS WTA Entered By: Midge Aver on 04/16/2023 17:03:58 -------------------------------------------------------------------------------- Wound Assessment Details Patient Name: Date of Service: Amy Keel, MA RY [name]. 04/16/2023 3:30 PM Medical Record Number: [id number] Patient Account Number: [id number] Date of Birth/Sex: Treating RN: [birth year] (82 y.o. [name] Primary Care Dariel Betzer: Sallyanne Kuster Other Clinician: Referring Shawntez Dickison: Treating Dannetta Lekas/Extender: Weldon Inches Weeks in Treatment: 3 Wound Status Wound Number: 1 Primary Pressure Ulcer Etiology: Wound Location: Left Gluteus Wound Healed - Epithelialized Wounding Event: Pressure Injury Status: Date Acquired: 02/23/2023 Comorbid Chronic Obstructive Pulmonary Disease (COPD), Sleep Apnea, Weeks Of Treatment: 3 History: Congestive Heart Failure, Hypertension, Type II Diabetes Clustered Wound: No Photos Wound Measurements Length: (cm) Width: (cm) Depth: (cm) Area: (cm) Volume: (cm) 0 % Reduction in Area: 97.6% 0 % Reduction in Volume: 97% 0 Epithelialization: Medium (34-66%) 0 0 Wound Description NADALEE, NEISWENDER [name] ([id number]) Classification: Category/Stage II Exudate Amount: Medium Exudate Type: Serosanguineous Exudate Color: red, brown [phone].pdf Page 9 of 11 Foul Odor After Cleansing: No Slough/Fibrino Yes Wound Bed Granulation Amount: Small (1-33%) Exposed Structure Granulation Quality: Pink Fascia Exposed: No Necrotic Amount: Large (67-100%) Fat Layer (Subcutaneous Tissue) Exposed: Yes Tendon Exposed: No Muscle Exposed: No Joint Exposed: No Bone Exposed: No Treatment Notes Wound #1 (Gluteus) Wound Laterality: Left Cleanser Peri-Wound  Care Topical Primary Dressing Secondary Dressing Secured With Compression Wrap Compression Stockings Add-Ons Electronic Signature(s) Signed: 04/16/2023 5:22:58 PM By: Midge Aver MSN RN CNS WTA Entered By: Midge Aver on 04/16/2023 17:00:50 -------------------------------------------------------------------------------- Wound Assessment Details Patient Name: Date of Service:  SIMPSO N, MA RY [name]. 04/16/2023 3:30 PM Medical Record Number: [id number] Patient Account Number: [id number] Date of Birth/Sex: Treating RN: [birth year] (82 y.o. [name] Primary Care Levita Monical: Sallyanne Kuster Other Clinician: Referring Maan Zarcone: Treating Joriel Streety/Extender: Weldon Inches Weeks in Treatment: 3 Wound Status Wound Number: 2 Primary Abrasion Etiology: Wound Location: Right Abdomen - Lower Quadrant Wound Open Wounding Event: Gradually Appeared Status: Date Acquired: 04/15/2023 Comorbid Chronic Obstructive Pulmonary Disease (COPD), Sleep Apnea, Weeks Of Treatment: 0 History: Congestive Heart Failure, Hypertension, Type II Diabetes Clustered Wound: No Photos JANIELLE, MITTELSTADT [name] ([id number]) [phone].pdf Page 10 of 11 Wound Measurements Length: (cm) 1 Width: (cm) 0.7 Depth: (cm) 0.2 Area: (cm) 0.55 Volume: (cm) 0.11 % Reduction in Area: % Reduction in Volume: Epithelialization: None Wound Description Classification: Full Thickness Without Exposed Suppor Exudate Amount: Medium Exudate Type: Serosanguineous Exudate Color: red, brown t Structures Foul Odor After Cleansing: No Slough/Fibrino Yes Wound Bed Granulation Amount: Small (1-33%) Exposed Structure Granulation Quality: Pink Fascia Exposed: No Necrotic Amount: Medium (34-66%) Fat Layer (Subcutaneous Tissue) Exposed: Yes Necrotic Quality: Adherent Slough Tendon Exposed: No Muscle Exposed: No Joint Exposed: No Bone Exposed: No Treatment Notes Wound #2 (Abdomen - Lower Quadrant) Wound  Laterality: Right Cleanser Soap and Water Discharge Instruction: Gently cleanse wound with antibacterial soap, rinse and pat dry prior to dressing wounds Peri-Wound Care Topical Primary Dressing Xeroform-HBD 2x2 (in/in) Discharge Instruction: Apply Xeroform-HBD 2x2 (in/in) as directed Secondary Dressing (BORDER) Zetuvit Plus SILICONE BORDER Dressing 4x4 (in/in) Discharge Instruction: Please do not put silicone bordered dressings under wraps. Use non-bordered dressing only. Secured With Compression Wrap Compression Stockings Facilities manager) Signed: 04/16/2023 5:22:58 PM By: Midge Aver MSN RN CNS WTA Entered By: Midge Aver on 04/16/2023 16:27:53 Fredell, Kaydie [name] ([id number]) [phone].pdf Page 11 of 11 -------------------------------------------------------------------------------- Vitals Details Patient Name: Date of Service: [name], [name]. 04/16/2023 3:30 PM Medical Record Number: [id number] Patient Account Number: [id number] Date of Birth/Sex: Treating RN: Dec 14, 1940 (82 y.o. [name] Primary Care Jerzy Crotteau: Sallyanne Kuster Other Clinician: Referring Ivry Pigue: Treating Regine Christian/Extender: Weldon Inches Weeks in Treatment: 3 Vital Signs Time Taken: 15:46 Temperature (F): 97.6 Height (in): 58 Pulse (bpm): 98 Weight (lbs): 152 Respiratory Rate (breaths/min): 18 Body Mass Index (BMI): 31.8 Blood Pressure (mmHg): 108/61 Reference Range: 80 - 120 mg / dl Electronic Signature(s) Signed: 04/16/2023 5:22:58 PM By: Midge Aver MSN RN CNS WTA Entered By: Midge Aver on 04/16/2023 15:46:38

## 2023-04-17 ENCOUNTER — Telehealth: Payer: Self-pay | Admitting: Nurse Practitioner

## 2023-04-17 DIAGNOSIS — E039 Hypothyroidism, unspecified: Secondary | ICD-10-CM | POA: Diagnosis not present

## 2023-04-17 DIAGNOSIS — E782 Mixed hyperlipidemia: Secondary | ICD-10-CM | POA: Diagnosis not present

## 2023-04-17 NOTE — Telephone Encounter (Signed)
No reply from patient or daughter regarding Day program form. Put in mail-Toni

## 2023-04-18 LAB — LIPID PANEL
Chol/HDL Ratio: 2.8 ratio (ref 0.0–4.4)
Cholesterol, Total: 104 mg/dL (ref 100–199)
HDL: 37 mg/dL — ABNORMAL LOW (ref >39)
LDL Chol Calc (NIH): 39 mg/dL (ref 0–99)
Triglycerides: 166 mg/dL — ABNORMAL HIGH (ref 0–149)
VLDL Cholesterol Cal: 28 mg/dL (ref 5–40)

## 2023-04-18 LAB — TSH+FREE T4
Free T4: 1.16 ng/dL (ref 0.82–1.77)
TSH: 5.7 u[IU]/mL — ABNORMAL HIGH (ref 0.450–4.500)

## 2023-04-18 NOTE — Progress Notes (Signed)
Will discuss results at upcoming office visit

## 2023-04-24 ENCOUNTER — Encounter: Payer: Medicare HMO | Attending: Physician Assistant | Admitting: Physician Assistant

## 2023-04-24 DIAGNOSIS — N184 Chronic kidney disease, stage 4 (severe): Secondary | ICD-10-CM | POA: Diagnosis not present

## 2023-04-24 DIAGNOSIS — E1122 Type 2 diabetes mellitus with diabetic chronic kidney disease: Secondary | ICD-10-CM | POA: Insufficient documentation

## 2023-04-24 DIAGNOSIS — Z794 Long term (current) use of insulin: Secondary | ICD-10-CM | POA: Diagnosis not present

## 2023-04-24 DIAGNOSIS — I130 Hypertensive heart and chronic kidney disease with heart failure and stage 1 through stage 4 chronic kidney disease, or unspecified chronic kidney disease: Secondary | ICD-10-CM | POA: Insufficient documentation

## 2023-04-24 DIAGNOSIS — Z7901 Long term (current) use of anticoagulants: Secondary | ICD-10-CM | POA: Diagnosis not present

## 2023-04-24 DIAGNOSIS — I5032 Chronic diastolic (congestive) heart failure: Secondary | ICD-10-CM | POA: Diagnosis not present

## 2023-04-24 DIAGNOSIS — Z8619 Personal history of other infectious and parasitic diseases: Secondary | ICD-10-CM | POA: Insufficient documentation

## 2023-04-24 DIAGNOSIS — H548 Legal blindness, as defined in USA: Secondary | ICD-10-CM | POA: Diagnosis not present

## 2023-04-24 DIAGNOSIS — I480 Paroxysmal atrial fibrillation: Secondary | ICD-10-CM | POA: Insufficient documentation

## 2023-04-24 DIAGNOSIS — L89322 Pressure ulcer of left buttock, stage 2: Secondary | ICD-10-CM | POA: Insufficient documentation

## 2023-04-24 DIAGNOSIS — J449 Chronic obstructive pulmonary disease, unspecified: Secondary | ICD-10-CM | POA: Diagnosis not present

## 2023-04-24 DIAGNOSIS — E11622 Type 2 diabetes mellitus with other skin ulcer: Secondary | ICD-10-CM | POA: Diagnosis not present

## 2023-04-24 DIAGNOSIS — S30811A Abrasion of abdominal wall, initial encounter: Secondary | ICD-10-CM | POA: Diagnosis not present

## 2023-04-24 DIAGNOSIS — F17218 Nicotine dependence, cigarettes, with other nicotine-induced disorders: Secondary | ICD-10-CM | POA: Diagnosis not present

## 2023-04-24 NOTE — Progress Notes (Addendum)
[name] [name] ([id number]) [id number] Page 1 of 7 Visit Report for 04/24/2023 Chief Complaint Document Details Patient Name: Date of Service: [name], [name]. 04/24/2023 3:30 PM Medical Record Number: [id number] Patient Account Number: [id number] Date of Birth/Sex: Treating RN: [name] (82 y.o. [name] Primary Care Provider: Sallyanne [name] Other Clinician: Referring Provider: Treating Provider/Extender: [name] Weeks in Treatment: 4 Information Obtained from: Patient Chief Complaint Left gluteal pressure ulcer Electronic Signature(s) Signed: 04/24/2023 3:41:22 PM By: Allen Derry PA-C Entered By: Allen Derry on 04/24/2023 15:41:22 -------------------------------------------------------------------------------- HPI Details Patient Name: Date of Service: [name], [name]. 04/24/2023 3:30 PM Medical Record Number: [id number] Patient Account Number: [id number] Date of Birth/Sex: Treating RN: [birth year], [name] (82 y.o. [name] Primary Care Provider: Sallyanne [name] Other Clinician: Referring Provider: Treating Provider/Extender: [name] Weeks in Treatment: 4 History of Present Illness HPI Description: 03-26-2023 upon evaluation today patient presents for initial evaluation here in the clinic concerning [name] wound over the left gluteal region this is [name] stage II pressure ulcer. Fortunately it does not appear to be too deep at all which is great news and very pleased in that regard. The patient is on Coumadin she also has been using mupirocin topically and has doxycycline of which she has 1 week left. The good news is I think the infection looks to be doing much better. With that being said she does spend most of her time in [name] recliner and I think that is where [name] lot of the pressure issues coming from. Patient does have [name] history of diabetes mellitus type 2, she uses insulin for control, hypertension, COPD,  chronic kidney disease stage IV, congestive heart failure, long-term use of anticoagulant therapy due to atrial fibrillation, this specifically his Coumadin, she is legally blind, and is [name] current smoker. 04-03-2023 upon evaluation today patient appears to be doing well currently in regard to her wound. This is actually showing signs of excellent improvement. Fortunately there does not appear to be any signs of infection locally nor systemically at this time. With that being said the patient does seem to be making some good progress here in regard to left gluteal ulcer. She does have [name] rash on the right gluteal region. This is something that tends to come and go about once [name] year. This has been going on for [name] number of years. 04-09-2023 upon evaluation today patient appears to be doing well currently with regard to her wounds. The area of shingles actually appears to be completely dry and healed up. The area where she has more drainage with regard to the primary wound also appears like it could be healed although not 100% sure on this when I would like to monitor for 1 more week before calling it completely healed. Fortunately I do not see any signs of active infection locally nor systemically which is great news. [name], [name] ([id number]) [id number] Page 2 of 7 04-16-2023 upon evaluation today patient appears to be doing excellent currently in regard to her original wound which is good on the gluteal region this is completely healed. 04-24-2023 upon evaluation today patient appears to be doing well currently in regard to her wound this is actually showing signs of improvement. Fortunately I do not see any evidence of active infection locally or systemically which is great news and in general I do believe that we will making good headway towards complete closure. I do not see any signs of infection the wound  is very clean today. Electronic Signature(s) Signed: 04/24/2023  3:58:36 PM By: Allen Derry PA-C Entered By: Allen Derry on 04/24/2023 15:58:35 -------------------------------------------------------------------------------- Physical Exam Details Patient Name: Date of Service: [name], [place] RY [name]. 04/24/2023 3:30 PM Medical Record Number: [id number] Patient Account Number: [id number] Date of Birth/Sex: Treating RN: [birth year] (82 y.o. [name] Primary Care Provider: Sallyanne [name] Other Clinician: Referring Provider: Treating Provider/Extender: [name] Weeks in Treatment: 4 Constitutional Well-nourished and well-hydrated in no acute distress. Respiratory normal breathing without difficulty. Psychiatric this patient is able to make decisions and demonstrates good insight into disease process. Alert and Oriented x 3. pleasant and cooperative. Notes Upon inspection patient's wound bed showed signs of good granulation epithelization at this point. Fortunately I do not see any signs of worsening overall and in general I do think that were doing well currently. Electronic Signature(s) Signed: 04/24/2023 3:58:48 PM By: Allen Derry PA-C Entered By: Allen Derry on 04/24/2023 15:58:48 -------------------------------------------------------------------------------- Physician Orders Details Patient Name: Date of Service: [name], [name]. 04/24/2023 3:30 PM Medical Record Number: [id number] Patient Account Number: [id number] Date of Birth/Sex: Treating RN: [birth year] (82 y.o. [name] Primary Care Provider: Sallyanne [name] Other Clinician: Referring Provider: Treating Provider/Extender: [name] Weeks in Treatment: 4 Verbal / Phone Orders: No [name], [name] ([id number]) [id number] Page 3 of 7 Diagnosis Coding ICD-10 Coding Code Description [phone] Pressure ulcer of left buttock, stage 2 E11.622 Type 2 diabetes mellitus with other skin ulcer Z79.4 Long term (current)  use of insulin B02.9 Zoster without complications I10 Essential (primary) hypertension J44.9 Chronic obstructive pulmonary disease, unspecified N18.4 Chronic kidney disease, stage 4 (severe) I50.42 Chronic combined systolic (congestive) and diastolic (congestive) heart failure Z79.01 Long term (current) use of anticoagulants I48.0 Paroxysmal atrial fibrillation H54.8 Legal blindness, as defined in [place] F17.218 Nicotine dependence, cigarettes, with other nicotine-induced disorders Follow-up Appointments Return Appointment in 1 week. Bathing/ [employer] wounds with antibacterial soap and water. Wound Treatment Wound #2 - Abdomen - Lower Quadrant Wound Laterality: Right Cleanser: Soap and Water Every Other Day/30 Days Discharge Instructions: Gently cleanse wound with antibacterial soap, rinse and pat dry prior to dressing wounds Prim Dressing: Xeroform-HBD 2x2 (in/in) Every Other Day/30 Days ary Discharge Instructions: Apply Xeroform-HBD 2x2 (in/in) as directed Secondary Dressing: (BORDER) Zetuvit Plus SILICONE BORDER Dressing 4x4 (in/in) Every Other Day/30 Days Discharge Instructions: Please do not put silicone bordered dressings under wraps. Use non-bordered dressing only. Electronic Signature(s) Signed: 04/24/2023 4:13:14 PM By: Midge Aver MSN RN CNS WTA Signed: 04/24/2023 5:13:07 PM By: Allen Derry PA-C Entered By: Midge Aver on 04/24/2023 16:13:14 -------------------------------------------------------------------------------- Problem List Details Patient Name: Date of Service: [name], [name]. 04/24/2023 3:30 PM Medical Record Number: [id number] Patient Account Number: [id number] Date of Birth/Sex: Treating RN: [name][birth year] (82 y.o. [name] Primary Care Provider: Sallyanne [name] Other Clinician: Referring Provider: Treating Provider/Extender: [name] Weeks in Treatment: 4 Active Problems ICD-10 Encounter Code Description Active Date  MDM Diagnosis [phone] Pressure ulcer of left buttock, stage 2 03/26/2023 No Yes [name], [name] ([id number]) [id number] Page 4 of 7 E11.622 Type 2 diabetes mellitus with other skin ulcer 03/26/2023 No Yes Z79.4 Long term (current) use of insulin 03/26/2023 No Yes B02.9 Zoster without complications 04/03/2023 No Yes I10 Essential (primary) hypertension 03/26/2023 No Yes J44.9 Chronic obstructive pulmonary disease, unspecified 03/26/2023 No Yes N18.4 Chronic kidney disease, stage 4 (severe) 03/26/2023 No Yes I50.42 Chronic combined systolic (congestive) and diastolic (congestive)  heart failure 03/26/2023 No Yes Z79.01 Long term (current) use of anticoagulants 03/26/2023 No Yes I48.0 Paroxysmal atrial fibrillation 03/26/2023 No Yes H54.8 Legal blindness, as defined in [place] 03/26/2023 No Yes F17.218 Nicotine dependence, cigarettes, with other nicotine-induced disorders 03/26/2023 No Yes Inactive Problems Resolved Problems Electronic Signature(s) Signed: 04/24/2023 4:16:15 PM By: Midge Aver MSN RN CNS WTA Signed: 04/24/2023 5:13:07 PM By: Allen Derry PA-C Previous Signature: 04/24/2023 3:41:16 PM Version By: Allen Derry PA-C Entered By: Midge Aver on 04/24/2023 16:16:14 -------------------------------------------------------------------------------- Progress Note Details Patient Name: Date of Service: [name], [name]. 04/24/2023 3:30 PM Medical Record Number: [id number] Patient Account Number: [id number] Date of Birth/Sex: Treating RN: [name], [name] (82 y.o. [name], [name] ([id number]) [id number] Page 5 of 7 Primary Care Provider: Sallyanne [name] Other Clinician: Referring Provider: Treating Provider/Extender: [name] Weeks in Treatment: 4 Subjective Chief Complaint Information obtained from Patient Left gluteal pressure ulcer History of Present Illness (HPI) 03-26-2023 upon evaluation today patient presents for  initial evaluation here in the clinic concerning [name] wound over the left gluteal region this is [name] stage II pressure ulcer. Fortunately it does not appear to be too deep at all which is great news and very pleased in that regard. The patient is on Coumadin she also has been using mupirocin topically and has doxycycline of which she has 1 week left. The good news is I think the infection looks to be doing much better. With that being said she does spend most of her time in [name] recliner and I think that is where [name] lot of the pressure issues coming from. Patient does have [name] history of diabetes mellitus type 2, she uses insulin for control, hypertension, COPD, chronic kidney disease stage IV, congestive heart failure, long-term use of anticoagulant therapy due to atrial fibrillation, this specifically his Coumadin, she is legally blind, and is [name] current smoker. 04-03-2023 upon evaluation today patient appears to be doing well currently in regard to her wound. This is actually showing signs of excellent improvement. Fortunately there does not appear to be any signs of infection locally nor systemically at this time. With that being said the patient does seem to be making some good progress here in regard to left gluteal ulcer. She does have [name] rash on the right gluteal region. This is something that tends to come and go about once [name] year. This has been going on for [name] number of years. 04-09-2023 upon evaluation today patient appears to be doing well currently with regard to her wounds. The area of shingles actually appears to be completely dry and healed up. The area where she has more drainage with regard to the primary wound also appears like it could be healed although not 100% sure on this when I would like to monitor for 1 more week before calling it completely healed. Fortunately I do not see any signs of active infection locally nor systemically which is great news. 04-16-2023 upon evaluation today patient  appears to be doing excellent currently in regard to her original wound which is good on the gluteal region this is completely healed. 04-24-2023 upon evaluation today patient appears to be doing well currently in regard to her wound this is actually showing signs of improvement. Fortunately I do not see any evidence of active infection locally or systemically which is great news and in general I do believe that we will making good headway towards complete closure. I do not see any signs of infection  the wound is very clean today. Objective Constitutional Well-nourished and well-hydrated in no acute distress. Vitals Time Taken: 3:43 PM, Height: 58 in, Weight: 152 lbs, BMI: 31.8, Temperature: 98.2 F, Pulse: 94 bpm, Respiratory Rate: 18 breaths/min, Blood Pressure: 92/59 mmHg. Respiratory normal breathing without difficulty. Psychiatric this patient is able to make decisions and demonstrates good insight into disease process. Alert and Oriented x 3. pleasant and cooperative. General Notes: Upon inspection patient's wound bed showed signs of good granulation epithelization at this point. Fortunately I do not see any signs of worsening overall and in general I do think that were doing well currently. Integumentary (Hair, Skin) Wound #2 status is Open. Original cause of wound was Gradually Appeared. The date acquired was: 6/[name]/2024. The wound has been in treatment 1 weeks. The wound is located on the Right Abdomen - Lower Quadrant. The wound measures 1cm length x 0.7cm width x 0.1cm depth; 0.55cm^2 area and 0.055cm^3 volume. There is Fat Layer (Subcutaneous Tissue) exposed. There is [name] medium amount of serosanguineous drainage noted. There is small (1-33%) pink granulation within the wound bed. There is [name] medium (34-66%) amount of necrotic tissue within the wound bed including Adherent Slough. Assessment Active Problems ICD-10 Pressure ulcer of left buttock, stage 2 Type 2 diabetes mellitus with  other skin ulcer Long term (current) use of insulin Zoster without complications Essential (primary) hypertension Chronic obstructive pulmonary disease, unspecified Chronic kidney disease, stage 4 (severe) Chronic combined systolic (congestive) and diastolic (congestive) heart failure Long term (current) use of anticoagulants [name], [name] ([id number]) [id number] Page 6 of 7 Paroxysmal atrial fibrillation Legal blindness, as defined in [place] Nicotine dependence, cigarettes, with other nicotine-induced disorders Plan Follow-up Appointments: Return Appointment in 1 week. Bathing/ Shower/ Hygiene: Wash wounds with antibacterial soap and water. WOUND #2: - Abdomen - Lower Quadrant Wound Laterality: Right Cleanser: Soap and Water Every Other Day/30 Days Discharge Instructions: Gently cleanse wound with antibacterial soap, rinse and pat dry prior to dressing wounds Prim Dressing: Xeroform-HBD 2x2 (in/in) Every Other Day/30 Days ary Discharge Instructions: Apply Xeroform-HBD 2x2 (in/in) as directed Secondary Dressing: (BORDER) Zetuvit Plus SILICONE BORDER Dressing 4x4 (in/in) Every Other Day/30 Days Discharge Instructions: Please do not put silicone bordered dressings under wraps. Use non-bordered dressing only. 1. I would recommend that we have the patient continue to monitor for any evidence of infection or worsening. In general I do think that we are making good headway towards getting this completely closed I think that the patient is tolerating the dressing changes without complication and overall I am happy with the prospects currently. 2. I would recommend the Xeroform be continued and she is in agreement with that plan. 3. I do think that she is well could go 2 weeks unfortunately her daughter who is her transportation is going to be busy next week and has not worked out well to get her in so we will going to go ahead and see about [name] 2-week follow-up most  likely. Follow-up 2 Electronic Signature(s) Signed: 04/24/2023 3:59:37 PM By: Allen Derry PA-C Entered By: Allen Derry on 04/24/2023 15:59:37 -------------------------------------------------------------------------------- SuperBill Details Patient Name: Date of Service: [name], [name]. 04/24/2023 Medical Record Number: [id number] Patient Account Number: [id number] Date of Birth/Sex: Treating RN: [name][birth year] (82 y.o. [name] Primary Care Provider: Sallyanne [name] Other Clinician: Referring Provider: Treating Provider/Extender: [name] Weeks in Treatment: 4 Diagnosis Coding ICD-10 Codes Code Description [phone] Pressure ulcer of left buttock, stage 2 E11.622 Type 2 diabetes mellitus  with other skin ulcer Z79.4 Long term (current) use of insulin B02.9 Zoster without complications I10 Essential (primary) hypertension J44.9 Chronic obstructive pulmonary disease, unspecified N18.4 Chronic kidney disease, stage 4 (severe) I50.42 Chronic combined systolic (congestive) and diastolic (congestive) heart failure Z79.01 Long term (current) use of anticoagulants I48.0 Paroxysmal atrial fibrillation [name], [name] ([id number]) [id number] Page 7 of 7 H54.8 Legal blindness, as defined in [place] F17.218 Nicotine dependence, cigarettes, with other nicotine-induced disorders Facility Procedures : CPT4 Code: [id number] Description: 99213 - WOUND CARE VISIT-LEV 3 EST PT Modifier: Quantity: 1 Physician Procedures : CPT4 Code Description Modifier [id number] 99213 - WC PHYS LEVEL 3 - EST PT ICD-10 Diagnosis Description L89.322 Pressure ulcer of left buttock, stage 2 E11.622 Type 2 diabetes mellitus with other skin ulcer Z79.4 Long term (current) use of insulin B02.9  Zoster without complications Quantity: 1 Electronic Signature(s) Signed: 04/24/2023 4:14:38 PM By: Midge Aver MSN RN CNS WTA Signed: 04/24/2023 5:13:07 PM By: Allen Derry PA-C Previous  Signature: 04/24/2023 3:59:50 PM Version By: Allen Derry PA-C Entered By: Midge Aver on 04/24/2023 16:14:38

## 2023-04-24 NOTE — Progress Notes (Addendum)
[name], [name] ([id number]) [id number] Page 1 of 9 Visit Report for 04/24/2023 Arrival Information Details Patient Name: Date of Service: [name], [name]. 04/24/2023 3:30 PM Medical Record Number: [id number] Patient Account Number: [id number] Date of Birth/Sex: Treating RN: 1941-06-23 (82 y.o. [name] Primary Care [name]: [name] Other Clinician: Referring [name]: Treating [name]/Extender: [name] Weeks in Treatment: 4 Visit Information History Since Last Visit Added or deleted any medications: No Patient Arrived: [name] Any new allergies or adverse reactions: No Arrival Time: 15:43 Has Dressing in Place as Prescribed: Yes Accompanied By: daughter Pain Present Now: No Transfer Assistance: None Patient Requires Transmission-Based Precautions: No Patient Has Alerts: Yes Patient Alerts: Patient on Blood Thinner Diabetes type 2 Coumadin Electronic Signature(s) Signed: 04/24/2023 4:12:40 PM By: Amy Aver MSN RN CNS WTA Entered By: [name] on 04/24/2023 16:12:40 -------------------------------------------------------------------------------- Clinic Level of Care Assessment Details Patient Name: Date of Service: [name]. 04/24/2023 3:30 PM Medical Record Number: [id number] Patient Account Number: [id number] Date of Birth/Sex: Treating RN: [birth year] (82 y.o. [name] Primary Care Temica Righetti: [name] Other Clinician: Referring Deionna Marcantonio: Treating [name]/Extender: [name] Weeks in Treatment: 4 Clinic Level of Care Assessment Items TOOL 4 Quantity Score X- 1 0 Use when only an EandM is performed on FOLLOW-UP visit ASSESSMENTS - Nursing Assessment / Reassessment X- 1 10 Reassessment of Co-morbidities (includes updates in patient status) X- 1 5 Reassessment of Adherence to Treatment Plan ASSESSMENTS - Wound and Skin [name] ssessment / Reassessment X - Simple Wound  Assessment / Reassessment - one wound 1 5 []  - 0 Complex Wound Assessment / Reassessment - multiple wounds [name], [name] ([id number]) [id number] Page 2 of 9 []  - 0 Dermatologic / Skin Assessment (not related to wound area) ASSESSMENTS - Focused Assessment []  - 0 Circumferential Edema Measurements - multi extremities []  - 0 Nutritional Assessment / Counseling / Intervention []  - 0 Lower Extremity Assessment (monofilament, tuning fork, pulses) []  - 0 Peripheral Arterial Disease Assessment (using hand held doppler) ASSESSMENTS - Ostomy and/or Continence Assessment and Care []  - 0 Incontinence Assessment and Management []  - 0 Ostomy Care Assessment and Management (repouching, etc.) PROCESS - Coordination of Care X - Simple Patient / Family Education for ongoing care 1 15 []  - 0 Complex (extensive) Patient / Family Education for ongoing care X- 1 10 Staff obtains Chiropractor, Records, T Results / Process Orders est []  - 0 Staff telephones HHA, Nursing Homes / Clarify orders / etc []  - 0 Routine Transfer to another Facility (non-emergent condition) []  - 0 Routine Hospital Admission (non-emergent condition) []  - 0 New Admissions / Manufacturing engineer / Ordering NPWT Apligraf, etc. , []  - 0 Emergency Hospital Admission (emergent condition) X- 1 10 Simple Discharge Coordination []  - 0 Complex (extensive) Discharge Coordination PROCESS - Special Needs []  - 0 Pediatric / Minor Patient Management []  - 0 Isolation Patient Management []  - 0 Hearing / Language / Visual special needs []  - 0 Assessment of Community assistance (transportation, D/C planning, etc.) []  - 0 Additional assistance / Altered mentation []  - 0 Support Surface(s) Assessment (bed, cushion, seat, etc.) INTERVENTIONS - Wound Cleansing / Measurement X - Simple Wound Cleansing - one wound 1 5 []  - 0 Complex Wound Cleansing - multiple wounds X- 1 5 Wound Imaging (photographs -  any number of wounds) []  - 0 Wound Tracing (instead of photographs) X- 1 5 Simple Wound Measurement - one wound []  - 0 Complex  Wound Measurement - multiple wounds INTERVENTIONS - Wound Dressings X - Small Wound Dressing one or multiple wounds 1 10 []  - 0 Medium Wound Dressing one or multiple wounds []  - 0 Large Wound Dressing one or multiple wounds []  - 0 Application of Medications - topical []  - 0 Application of Medications - injection INTERVENTIONS - Miscellaneous []  - 0 External ear exam []  - 0 Specimen Collection (cultures, biopsies, blood, body fluids, etc.) []  - 0 Specimen(s) / Culture(s) sent or taken to Lab for analysis [name], [name] ([id number]) [id number] Page 3 of 9 []  - 0 Patient Transfer (multiple staff / Nurse, adult / Similar devices) []  - 0 Simple Staple / Suture removal (25 or less) []  - 0 Complex Staple / Suture removal (26 or more) []  - 0 Hypo / Hyperglycemic Management (close monitor of Blood Glucose) []  - 0 Ankle / Brachial Index (ABI) - do not check if billed separately X- 1 5 Vital Signs Has the patient been seen at the hospital within the last three years: Yes Total Score: 85 Level Of Care: New/Established - Level 3 Electronic Signature(s) Signed: 04/30/2023 5:27:27 PM By: Amy Aver MSN RN CNS WTA Entered By: [name] on 04/24/2023 16:13:59 -------------------------------------------------------------------------------- Encounter Discharge Information Details Patient Name: Date of Service: [name]. 04/24/2023 3:30 PM Medical Record Number: [id number] Patient Account Number: [id number] Date of Birth/Sex: Treating RN: 09-02-1941 (82 y.o. [name] Primary Care Gawain Crombie: [name] Other Clinician: Referring Trini Soldo: Treating Jalyssa Fleisher/Extender: [name] Weeks in Treatment: 4 Encounter Discharge Information Items Discharge Condition: Stable Ambulatory Status:  [name] Discharge Destination: Home Transportation: Private Auto Accompanied By: daughter Schedule Follow-up Appointment: Yes Clinical Summary of Care: Electronic Signature(s) Signed: 04/24/2023 4:16:54 PM By: Amy Aver MSN RN CNS WTA Entered By: [name] on 04/24/2023 16:16:54 -------------------------------------------------------------------------------- Lower Extremity Assessment Details Patient Name: Date of Service: [name]. 04/24/2023 3:30 PM Medical Record Number: [id number] Patient Account Number: [id number] Date of Birth/Sex: Treating RN: [birth year] (82 y.o. [name] Primary Care Katelynne Revak: [name] Other Clinician: Referring Lada Fulbright: Treating Renie Stelmach/Extender: [name] [place], [place] [name] ([id number]) [id number] Page 4 of 9 Weeks in Treatment: 4 Electronic Signature(s) Signed: 04/24/2023 4:12:55 PM By: Amy Aver MSN RN CNS WTA Entered By: [name] on 04/24/2023 16:12:55 -------------------------------------------------------------------------------- Multi Wound Chart Details Patient Name: Date of Service: [name]. 04/24/2023 3:30 PM Medical Record Number: [id number] Patient Account Number: [id number] Date of Birth/Sex: Treating RN: July 11, 1941 (82 y.o. [name] Primary Care Cruzita Lipa: Sallyanne [name] Other Clinician: Referring Itzayanna Kaster: Treating Melodee Lupe/Extender: [name] Weeks in Treatment: 4 Vital Signs Height(in): 58 Pulse(bpm): 94 Weight(lbs): 152 Blood Pressure(mmHg): 92/59 Body Mass Index(BMI): 31.8 Temperature(F): 98.2 Respiratory Rate(breaths/min): 18 [2:Photos:] [N/[name]:N/[name]] Right Abdomen - Lower Quadrant N/[name] N/[name] Wound Location: Gradually Appeared N/[name] N/[name] Wounding Event: Abrasion N/[name] N/[name] Primary Etiology: Chronic Obstructive Pulmonary N/[name] N/[name] Comorbid History: Disease (COPD), Sleep Apnea, Congestive Heart Failure, Hypertension, Type II  Diabetes 04/15/2023 N/[name] N/[name] Date Acquired: 1 N/[name] N/[name] Weeks of Treatment: Open N/[name] N/[name] Wound Status: No N/[name] N/[name] Wound Recurrence: 1x0.7x0.1 N/[name] N/[name] Measurements L x W x D (cm) 0.55 N/[name] N/[name] [name] (cm) : rea 0.055 N/[name] N/[name] Volume (cm) : 0.00% N/[name] N/[name] % Reduction in Area: 50.00% N/[name] N/[name] % Reduction in Volume: Full Thickness Without Exposed N/[name] N/[name] Classification: Support Structures Medium N/[name] N/[name] Exudate Amount: Serosanguineous N/[name] N/[name] Exudate Type: red, brown N/[name] N/[name] Exudate Color: Small (1-33%)  N/[name] N/[name] Granulation Amount: Pink N/[name] N/[name] Granulation Quality: Medium (34-66%) N/[name] N/[name] Necrotic Amount: Fat Layer (Subcutaneous Tissue): Yes N/[name] N/[name] Exposed Structures: Fascia: No Tendon: No Muscle: No Joint: No Bone: No None N/[name] N/[name] Epithelialization: [name], [name] ([id number]) [phone].pdf Page 5 of 9 Treatment Notes Electronic Signature(s) Signed: 04/24/2023 4:13:03 PM By: Amy Aver MSN RN CNS WTA Entered By: [name] on 04/24/2023 16:13:02 -------------------------------------------------------------------------------- Multi-Disciplinary Care Plan Details Patient Name: Date of Service: [name], [place] RY [name]. 04/24/2023 3:30 PM Medical Record Number: [id number] Patient Account Number: [id number] Date of Birth/Sex: Treating RN: 24-Apr-1941 (82 y.o. [name] Primary Care Darienne Belleau: [name] Other Clinician: Referring Rashay Barnette: Treating Laveda Demedeiros/Extender: [name] Weeks in Treatment: 4 Active Inactive Wound/Skin Impairment Nursing Diagnoses: Impaired tissue integrity Knowledge deficit related to smoking impact on wound healing Knowledge deficit related to ulceration/compromised skin integrity Goals: Patient will demonstrate [name] reduced rate of smoking or cessation of smoking Date Initiated: 03/26/2023 Date Inactivated: 04/09/2023 Target Resolution Date: 04/26/2023 Goal Status: Met Ulcer/skin breakdown will have [name]  volume reduction of 30% by week 4 Date Initiated: 03/26/2023 Date Inactivated: 04/24/2023 Target Resolution Date: 04/25/2023 Goal Status: Met Ulcer/skin breakdown will have [name] volume reduction of 50% by week 8 Date Initiated: 03/26/2023 Target Resolution Date: 05/26/2023 Goal Status: Active Ulcer/skin breakdown will have [name] volume reduction of 80% by week 12 Date Initiated: 03/26/2023 Target Resolution Date: 06/26/2023 Goal Status: Active Interventions: Assess patient/caregiver ability to obtain necessary supplies Assess patient/caregiver ability to perform ulcer/skin care regimen upon admission and as needed Assess ulceration(s) every visit Provide education on smoking Provide education on ulcer and skin care Treatment Activities: Skin care regimen initiated : 03/26/2023 Notes: Electronic Signature(s) Signed: 04/24/2023 4:15:43 PM By: Amy Aver MSN RN CNS WTA Entered By: [name] on 04/24/2023 16:15:43 [name], [name] ([id number]) [id number] Page 6 of 9 -------------------------------------------------------------------------------- Pain Assessment Details Patient Name: Date of Service: [name], [name]. 04/24/2023 3:30 PM Medical Record Number: [id number] Patient Account Number: [id number] Date of Birth/Sex: Treating RN: 1940/12/22 (82 y.o. [name] Primary Care Delenn Ahn: [name] Other Clinician: Referring Lilliona Blakeney: Treating Danyal Whitenack/Extender: [name] Weeks in Treatment: 4 Active Problems Location of Pain Severity and Description of Pain Patient Has Paino No Site Locations Pain Management and Medication Current Pain Management: Electronic Signature(s) Signed: 04/24/2023 4:12:48 PM By: Amy Aver MSN RN CNS WTA Entered By: [name] on 04/24/2023 16:12:48 -------------------------------------------------------------------------------- Patient/Caregiver Education Details Patient Name: Date of Service: Amy Foley [name]. 7/2/2024andnbsp3:30 PM Medical Record Number: [id number] Patient Account Number: [id number] Date of Birth/Gender: Treating RN: 09/02/1941 (82 y.o. [name] Primary Care Physician: [name] Other Clinician: Referring Physician: Treating Physician/Extender: [name] Weeks in Treatment: 4 [name], [name] ([id number]) [id number] Page 7 of 9 Education Assessment Education Provided To: Patient Education Topics Provided Wound/Skin Impairment: Handouts: Caring for Your Ulcer Methods: Explain/Verbal Responses: State content correctly Electronic Signature(s) Signed: 04/30/2023 5:27:27 PM By: Amy Aver MSN RN CNS WTA Entered By: [name] on 04/24/2023 16:15:55 -------------------------------------------------------------------------------- Wound Assessment Details Patient Name: Date of Service: [name]. 04/24/2023 3:30 PM Medical Record Number: [id number] Patient Account Number: [id number] Date of Birth/Sex: Treating RN: 1941/10/23 (82 y.o. [name] Primary Care Nadine Ryle: [name] Other Clinician: Referring Makaylia Hewett: Treating Kito Cuffe/Extender: [name] Weeks in Treatment: 4 Wound Status Wound Number: 2 Primary Abrasion Etiology: Wound Location: Right Abdomen - Lower Quadrant Wound Open Wounding Event: Gradually Appeared  Status: Date Acquired: 04/15/2023 Comorbid Chronic Obstructive Pulmonary Disease (COPD), Sleep Apnea, Weeks Of Treatment: 1 History: Congestive Heart Failure, Hypertension, Type II Diabetes Clustered Wound: No Photos Wound Measurements Length: (cm) 1 Width: (cm) 0.7 Depth: (cm) 0.1 Area: (cm) 0.55 Volume: (cm) 0.055 % Reduction in Area: 0% % Reduction in Volume: 50% Epithelialization: None Wound Description Classification: Full Thickness Without Exposed Support Exudate Amount: Medium Exudate Type: Serosanguineous [name], [name]  ([id number]) Exudate Color: red, brown Structures Foul Odor After Cleansing: No Slough/Fibrino Yes [phone].pdf Page 8 of 9 Wound Bed Granulation Amount: Small (1-33%) Exposed Structure Granulation Quality: Pink Fascia Exposed: No Necrotic Amount: Medium (34-66%) Fat Layer (Subcutaneous Tissue) Exposed: Yes Necrotic Quality: Adherent Slough Tendon Exposed: No Muscle Exposed: No Joint Exposed: No Bone Exposed: No Treatment Notes Wound #2 (Abdomen - Lower Quadrant) Wound Laterality: Right Cleanser Soap and Water Discharge Instruction: Gently cleanse wound with antibacterial soap, rinse and pat dry prior to dressing wounds Peri-Wound Care Topical Primary Dressing Xeroform-HBD 2x2 (in/in) Discharge Instruction: Apply Xeroform-HBD 2x2 (in/in) as directed Secondary Dressing (BORDER) Zetuvit Plus SILICONE BORDER Dressing 4x4 (in/in) Discharge Instruction: Please do not put silicone bordered dressings under wraps. Use non-bordered dressing only. Secured With Compression Wrap Compression Stockings Facilities manager) Signed: 04/30/2023 5:27:27 PM By: Amy Aver MSN RN CNS WTA Entered By: [name] on 04/24/2023 15:49:18 -------------------------------------------------------------------------------- Vitals Details Patient Name: Date of Service: [name]. 04/24/2023 3:30 PM Medical Record Number: [id number] Patient Account Number: [id number] Date of Birth/Sex: Treating RN: December 03, 1940 (82 y.o. [name] Primary Care Marselino Slayton: [name] Other Clinician: Referring Dalexa Gentz: Treating Maxemiliano Riel/Extender: [name] Weeks in Treatment: 4 Vital Signs Time Taken: 15:43 Temperature (F): 98.2 Height (in): 58 Pulse (bpm): 94 Weight (lbs): 152 Respiratory Rate (breaths/min): 18 Body Mass Index (BMI): 31.8 Blood Pressure (mmHg): 92/59 Reference Range: 80 - 120 mg / dl Electronic Signature(s) Signed:  04/24/2023 4:12:44 PM By: Amy Aver MSN RN CNS WTA [name], MARY7/11/2022 4:12:44 PM By: Amy Aver MSN RN CNS WTA Signed: A ([id number]) [id number] Page 9 of 9 Entered By: [name] on 04/24/2023 16:12:44

## 2023-04-27 DIAGNOSIS — Z7901 Long term (current) use of anticoagulants: Secondary | ICD-10-CM | POA: Diagnosis not present

## 2023-04-30 ENCOUNTER — Other Ambulatory Visit: Payer: Self-pay | Admitting: Nurse Practitioner

## 2023-04-30 DIAGNOSIS — K5901 Slow transit constipation: Secondary | ICD-10-CM

## 2023-05-01 ENCOUNTER — Encounter: Payer: Self-pay | Admitting: Nurse Practitioner

## 2023-05-01 ENCOUNTER — Ambulatory Visit (INDEPENDENT_AMBULATORY_CARE_PROVIDER_SITE_OTHER): Payer: Medicare HMO | Admitting: Nurse Practitioner

## 2023-05-01 VITALS — BP 98/51 | HR 78 | Temp 98.4°F | Resp 16 | Ht <= 58 in | Wt 157.8 lb

## 2023-05-01 DIAGNOSIS — I10 Essential (primary) hypertension: Secondary | ICD-10-CM

## 2023-05-01 DIAGNOSIS — L89329 Pressure ulcer of left buttock, unspecified stage: Secondary | ICD-10-CM | POA: Diagnosis not present

## 2023-05-01 DIAGNOSIS — E1165 Type 2 diabetes mellitus with hyperglycemia: Secondary | ICD-10-CM | POA: Diagnosis not present

## 2023-05-01 DIAGNOSIS — Z79899 Other long term (current) drug therapy: Secondary | ICD-10-CM

## 2023-05-01 MED ORDER — INSULIN GLARGINE 100 UNIT/ML SOLOSTAR PEN
24.0000 [IU] | PEN_INJECTOR | Freq: Every day | SUBCUTANEOUS | 3 refills | Status: DC
Start: 2023-05-01 — End: 2023-05-15

## 2023-05-01 NOTE — Progress Notes (Signed)
[facility] [address] [place], [place] [address]  Internal MEDICINE  Office Visit Note  Patient Name: [name]  [id number]  [id number]  Date of Service: 05/01/2023  Chief Complaint  Patient presents with   Diabetes   Gastroesophageal Reflux   Hyperlipidemia   Hypertension   Follow-up    HPI [name] presents for a follow-up visit for      Current Medication: Outpatient Encounter Medications as of 05/01/2023  Medication Sig   amLODipine (NORVASC) 5 MG tablet Take 1 tablet (5 mg total) by mouth daily.   atorvastatin (LIPITOR) 10 MG tablet Take 1 tablet (10 mg total) by mouth every evening.   citalopram (CELEXA) 20 MG tablet Take 1 tablet (20 mg total) by mouth daily.   cyclobenzaprine (FLEXERIL) 5 MG tablet TAKE 1 TABLET BY MOUTH AT BEDTIME   donepezil (ARICEPT) 5 MG tablet Take 1 tablet (5 mg total) by mouth every morning.   ferrous sulfate 325 (65 FE) MG tablet Take 1 tablet (325 mg total) by mouth daily with breakfast.   insulin glargine (LANTUS) 100 UNIT/ML Solostar Pen Inject 20 Units into the skin daily.   Insulin Pen Needle (PEN NEEDLES 3/16") 31G X 5 MM MISC 1 applicator by Does not apply route 4 (four) times daily.   linagliptin (TRADJENTA) 5 MG TABS tablet Take 1 tablet (5 mg total) by mouth daily.   LINZESS 145 MCG CAPS capsule Take 1 capsule by mouth at bedtime   melatonin 3 MG TABS tablet Take 3 mg by mouth at bedtime as needed (sleep).   methocarbamol (ROBAXIN) 500 MG tablet Take 1 tablet (500 mg total) by mouth in the morning and at bedtime.   montelukast (SINGULAIR) 10 MG tablet TAKE 1 TABLET BY MOUTH AT BEDTIME   mupirocin ointment (BACTROBAN) 2 % Apply 1 Application topically daily. To wound until healed.   ondansetron (ZOFRAN-ODT) 4 MG disintegrating tablet Take 1 tablet (4 mg total) by mouth every 6 (six) hours as needed for nausea or vomiting.   oxyCODONE-acetaminophen (PERCOCET/ROXICET) 5-325 MG tablet Take 1 tablet by mouth 2 (two) times  daily as needed for severe pain.   oxyCODONE-acetaminophen (PERCOCET/ROXICET) 5-325 MG tablet Take 1 tablet by mouth 2 (two) times daily as needed for severe pain.   pantoprazole (PROTONIX) 40 MG tablet Take 1 tablet (40 mg total) by mouth 2 (two) times daily.   rOPINIRole (REQUIP) 0.5 MG tablet Take 1 tablet (0.5 mg total) by mouth every evening.   torsemide (DEMADEX) 20 MG tablet Take 2.5 tablets (50 mg total) by mouth daily.   traMADol (ULTRAM) 50 MG tablet Take one tab po bid prn for pain   vitamin B-12 1000 MCG tablet Take 1 tablet (1,000 mcg total) by mouth daily.   warfarin (COUMADIN) 4 MG tablet TAKE 1 TABLET BY MOUTH ON MONDAY, [facility] AND FRIDAY   warfarin (COUMADIN) 2 MG tablet Take 1 tab po Tuesday ,Thursday ,Friday ad Saturday and Sunday and then take 4 mg rest of week   No facility-administered encounter medications on file as of 05/01/2023.    Surgical History: Past Surgical History:  Procedure Laterality Date   ABDOMINAL HYSTERECTOMY     APPENDECTOMY     CATARACT EXTRACTION     CHOLECYSTECTOMY     HERNIA REPAIR     right knee replacement     right nephroectomy      Medical History: Past Medical History:  Diagnosis Date   Anemia    Atrial fibrillation (HCC)  Chronic kidney disease    Congestive heart failure (CHF) (HCC)    COPD (chronic obstructive pulmonary disease) (HCC)    Diabetes mellitus without complication (HCC)    GERD (gastroesophageal reflux disease)    Hyperlipidemia    Hypertension    Pneumonia    Pulmonary fibrosis (HCC)     Family History: Family History  Problem Relation Age of Onset   Diabetes Mother    Breast cancer Mother    Heart disease Father    Diabetes Father    Diabetes Sister    Diabetes Brother    Cancer Brother     Social History   Socioeconomic History   Marital status: Widowed    Spouse name: Not on file   Number of children: Not on file   Years of education: Not on file   Highest education level: Not on file   Occupational History   Occupation: retired  Tobacco Use   Smoking status: Former    Packs/day: 1    Types: Cigarettes    Quit date: 08/19/2022    Years since quitting: 0.6   Smokeless tobacco: Never   Tobacco comments:    1 pack daily---quit 1 month   Vaping Use   Vaping Use: Never used  Substance and Sexual Activity   Alcohol use: No   Drug use: No   Sexual activity: Not Currently  Other Topics Concern   Not on file  Social History Narrative   Lives with daughter   Social Determinants of Health   Financial Resource Strain: Not on file  Food Insecurity: Not on file  Transportation Needs: Not on file  Physical Activity: Not on file  Stress: Not on file  Social Connections: Not on file  Intimate Partner Violence: Not on file      Review of Systems  Vital Signs: BP (!) 98/51   Pulse 78   Temp 98.4 F (36.9 C)   Resp 16   Ht 4\' 5"  (1.346 m)   Wt 157 lb 12.8 oz (71.6 kg)   SpO2 95%   BMI 39.50 kg/m    Physical Exam     Assessment/Plan:   General Counseling: [name] verbalizes understanding of the findings of todays visit and agrees with plan of treatment. I have discussed any further diagnostic evaluation that may be needed or ordered today. We also reviewed her medications today. she has been encouraged to call the office with any questions or concerns that should arise related to todays visit.    No orders of the defined types were placed in this encounter.   No orders of the defined types were placed in this encounter.   No follow-ups on file.   Total time spent:*** Minutes Time spent includes review of chart, medications, test results, and follow up plan with the patient.   [place] Controlled Substance Database was reviewed by me.  This patient was seen by Sallyanne Kuster, FNP-C in collaboration with Dr. Beverely Risen as a part of collaborative care agreement.   Yong Wahlquist R. Tedd Sias, MSN, FNP-C Internal medicine

## 2023-05-02 ENCOUNTER — Encounter: Payer: Self-pay | Admitting: Nurse Practitioner

## 2023-05-08 ENCOUNTER — Encounter: Payer: Medicare HMO | Admitting: Physician Assistant

## 2023-05-08 DIAGNOSIS — E11622 Type 2 diabetes mellitus with other skin ulcer: Secondary | ICD-10-CM | POA: Diagnosis not present

## 2023-05-08 DIAGNOSIS — H548 Legal blindness, as defined in USA: Secondary | ICD-10-CM | POA: Diagnosis not present

## 2023-05-08 DIAGNOSIS — N184 Chronic kidney disease, stage 4 (severe): Secondary | ICD-10-CM | POA: Diagnosis not present

## 2023-05-08 DIAGNOSIS — L89322 Pressure ulcer of left buttock, stage 2: Secondary | ICD-10-CM | POA: Diagnosis not present

## 2023-05-08 DIAGNOSIS — I5032 Chronic diastolic (congestive) heart failure: Secondary | ICD-10-CM | POA: Diagnosis not present

## 2023-05-08 DIAGNOSIS — S30811A Abrasion of abdominal wall, initial encounter: Secondary | ICD-10-CM | POA: Diagnosis not present

## 2023-05-08 DIAGNOSIS — E1122 Type 2 diabetes mellitus with diabetic chronic kidney disease: Secondary | ICD-10-CM | POA: Diagnosis not present

## 2023-05-08 DIAGNOSIS — I480 Paroxysmal atrial fibrillation: Secondary | ICD-10-CM | POA: Diagnosis not present

## 2023-05-08 DIAGNOSIS — J449 Chronic obstructive pulmonary disease, unspecified: Secondary | ICD-10-CM | POA: Diagnosis not present

## 2023-05-08 DIAGNOSIS — I130 Hypertensive heart and chronic kidney disease with heart failure and stage 1 through stage 4 chronic kidney disease, or unspecified chronic kidney disease: Secondary | ICD-10-CM | POA: Diagnosis not present

## 2023-05-08 NOTE — Progress Notes (Signed)
[name], [name] ([id number]) [id number] Page 1 of 8 Visit Report for 05/08/2023 Arrival Information Details Patient Name: Date of Service: [name], [name]. 05/08/2023 3:30 PM Medical Record Number: [id number] Patient Account Number: [id number] Date of Birth/Sex: Treating RN: October 25, 1940 (82 y.o. [name] Primary Care [name]: [name] Other Clinician: Referring [name]: Treating [name]/Extender: [name] Weeks in Treatment: 6 Visit Information History Since Last Visit Added or deleted any medications: No Patient Arrived: Walker Any new allergies or adverse reactions: No Arrival Time: 15:48 Has Dressing in Place as Prescribed: Yes Accompanied By: daughter Pain Present Now: No Transfer Assistance: None Patient Identification Verified: Yes Secondary Verification Process Completed: Yes Patient Requires Transmission-Based Precautions: No Patient Has Alerts: Yes Patient Alerts: Patient on Blood Thinner Diabetes type 2 Coumadin Electronic Signature(s) Signed: 05/08/2023 4:26:56 PM By: Amy Aver MSN RN CNS WTA Entered By: [name] on 05/08/2023 15:52:08 -------------------------------------------------------------------------------- Clinic Level of Care Assessment Details Patient Name: Date of Service: [name], [name]. 05/08/2023 3:30 PM Medical Record Number: [id number] Patient Account Number: [id number] Date of Birth/Sex: Treating RN: 1941-03-07 (82 y.o. [name] Primary Care [name]: [name] Other Clinician: Referring [name]: Treating [name]/Extender: [name] Weeks in Treatment: 6 Clinic Level of Care Assessment Items TOOL 1 Quantity Score []  - 0 Use when EandM and Procedure is performed on INITIAL visit ASSESSMENTS - Nursing Assessment / Reassessment []  - 0 General Physical Exam (combine w/ comprehensive assessment (listed just below) when performed on new pt.  evals) []  - 0 Comprehensive Assessment (HX, ROS, Risk Assessments, Wounds Hx, etc.) ASSESSMENTS - Wound and Skin Assessment / Reassessment []  - 0 Dermatologic / Skin Assessment (not related to wound area) [name], [name] ([id number]) [id number] Page 2 of 8 ASSESSMENTS - Ostomy and/or Continence Assessment and Care []  - 0 Incontinence Assessment and Management []  - 0 Ostomy Care Assessment and Management (repouching, etc.) PROCESS - Coordination of Care []  - 0 Simple Patient / Family Education for ongoing care []  - 0 Complex (extensive) Patient / Family Education for ongoing care []  - 0 Staff obtains Chiropractor, Records, T Results / Process Orders est []  - 0 Staff telephones HHA, Nursing Homes / Clarify orders / etc []  - 0 Routine Transfer to another Facility (non-emergent condition) []  - 0 Routine Hospital Admission (non-emergent condition) []  - 0 New Admissions / Manufacturing engineer / Ordering NPWT Apligraf, etc. , []  - 0 Emergency Hospital Admission (emergent condition) PROCESS - Special Needs []  - 0 Pediatric / Minor Patient Management []  - 0 Isolation Patient Management []  - 0 Hearing / Language / Visual special needs []  - 0 Assessment of Community assistance (transportation, D/C planning, etc.) []  - 0 Additional assistance / Altered mentation []  - 0 Support Surface(s) Assessment (bed, cushion, seat, etc.) INTERVENTIONS - Miscellaneous []  - 0 External ear exam []  - 0 Patient Transfer (multiple staff / Nurse, adult / Similar devices) []  - 0 Simple Staple / Suture removal (25 or less) []  - 0 Complex Staple / Suture removal (26 or more) []  - 0 Hypo/Hyperglycemic Management (do not check if billed separately) []  - 0 Ankle / Brachial Index (ABI) - do not check if billed separately Has the patient been seen at the hospital within the last three years: Yes Total Score: 0 Level Of Care: ____ Electronic Signature(s) Signed: 05/08/2023  4:26:56 PM By: Amy Aver MSN RN CNS WTA Entered By: [name] on 05/08/2023 16:19:17 -------------------------------------------------------------------------------- Encounter Discharge Information Details Patient Name:  Date of Service: [name], [name]. 05/08/2023 3:30 PM Medical Record Number: [id number] Patient Account Number: [id number] Date of Birth/Sex: Treating RN: 05/04/1941 (82 y.o. [name] Primary Care [name]: [name] Other Clinician: Referring [name]: Treating [name]/Extender: [name] Weeks in Treatment: 6 Encounter Discharge Information Items Post Procedure Vitals [name], [name] ([id number]) [id number] Page 3 of 8 Discharge Condition: Stable Temperature (F): 97.9 Ambulatory Status: Walker Pulse (bpm): 88 Discharge Destination: Home Respiratory Rate (breaths/min): 18 Transportation: Private Auto Blood Pressure (mmHg): 127/77 Accompanied By: daughter Schedule Follow-up Appointment: Yes Clinical Summary of Care: Electronic Signature(s) Signed: 05/08/2023 4:26:56 PM By: Amy Aver MSN RN CNS WTA Entered By: [name] on 05/08/2023 16:25:00 -------------------------------------------------------------------------------- Lower Extremity Assessment Details Patient Name: Date of Service: [name], [name]. 05/08/2023 3:30 PM Medical Record Number: [id number] Patient Account Number: [id number] Date of Birth/Sex: Treating RN: Jul 28, 1941 (82 y.o. [name] Primary Care [name]: [name] Other Clinician: Referring [name]: Treating [name]/Extender: [name] Weeks in Treatment: 6 Electronic Signature(s) Signed: 05/08/2023 4:26:56 PM By: Amy Aver MSN RN CNS WTA Entered By: [name] on 05/08/2023 16:01:24 -------------------------------------------------------------------------------- Multi Wound Chart Details Patient Name: Date of Service: Amy Keel, MA [name]. 05/08/2023 3:30 PM Medical Record Number: [id number] Patient Account Number: [id number] Date of Birth/Sex: Treating RN: Nov 11, 1940 (82 y.o. [name] Primary Care Graeme Menees: [name] Other Clinician: Referring Elfriede Bonini: Treating Layney Gillson/Extender: [name] Weeks in Treatment: 6 Vital Signs Height(in): 58 Pulse(bpm): 88 Weight(lbs): 152 Blood Pressure(mmHg): 127/77 Body Mass Index(BMI): 31.8 Temperature(F): 97.9 Respiratory Rate(breaths/min): 18 [2:Photos:] [N/[name]:N/[name]] Right Abdomen - Lower Quadrant N/[name] N/[name] Wound Location: Gradually Appeared N/[name] N/[name] Wounding Event: Abrasion N/[name] N/[name] Primary Etiology: Chronic Obstructive Pulmonary N/[name] N/[name] Comorbid History: Disease (COPD), Sleep Apnea, Congestive Heart Failure, Hypertension, Type II Diabetes 04/15/2023 N/[name] N/[name] Date Acquired: 3 N/[name] N/[name] Weeks of Treatment: Open N/[name] N/[name] Wound Status: No N/[name] N/[name] Wound Recurrence: 0.5x1.7x0.1 N/[name] N/[name] Measurements L x W x D (cm) 0.668 N/[name] N/[name] [name] (cm) : rea 0.067 N/[name] N/[name] Volume (cm) : -21.50% N/[name] N/[name] % Reduction in Area: 39.10% N/[name] N/[name] % Reduction in Volume: Full Thickness Without Exposed N/[name] N/[name] Classification: Support Structures Medium N/[name] N/[name] Exudate Amount: Serosanguineous N/[name] N/[name] Exudate Type: red, brown N/[name] N/[name] Exudate Color: Small (1-33%) N/[name] N/[name] Granulation Amount: Pink N/[name] N/[name] Granulation Quality: Medium (34-66%) N/[name] N/[name] Necrotic Amount: Fat Layer (Subcutaneous Tissue): Yes N/[name] N/[name] Exposed Structures: Fascia: No Tendon: No Muscle: No Joint: No Bone: No None N/[name] N/[name] Epithelialization: Treatment Notes Electronic Signature(s) Signed: 05/08/2023 4:26:56 PM By: Amy Aver MSN RN CNS WTA Entered By: [name] on 05/08/2023 16:01:45 -------------------------------------------------------------------------------- Multi-Disciplinary Care Plan Details Patient Name: Date of Service: Amy Keel, MA [name]. 05/08/2023 3:30  PM Medical Record Number: [id number] Patient Account Number: [id number] Date of Birth/Sex: Treating RN: 1941-03-03 (82 y.o. [name] Primary Care Eagan Shifflett: [name] Other Clinician: Referring Onesha Krebbs: Treating Scheryl Sanborn/Extender: [name] Weeks in Treatment: 6 Active Inactive Wound/Skin Impairment Nursing Diagnoses: Impaired tissue integrity Knowledge deficit related to smoking impact on wound healing Knowledge deficit related to ulceration/compromised skin integrity [name], [name] ([id number]) [id number] Page 5 of 8 Goals: Patient will demonstrate [name] reduced rate of smoking or cessation of smoking Date Initiated: 03/26/2023 Date Inactivated: 04/09/2023 Target Resolution Date: 04/26/2023 Goal Status: Met Ulcer/skin breakdown will have [name] volume reduction of 30% by week 4 Date Initiated: 03/26/2023 Date Inactivated: 04/24/2023 Target Resolution Date: 04/25/2023 Goal  Status: Met Ulcer/skin breakdown will have [name] volume reduction of 50% by week 8 Date Initiated: 03/26/2023 Target Resolution Date: 05/26/2023 Goal Status: Active Ulcer/skin breakdown will have [name] volume reduction of 80% by week 12 Date Initiated: 03/26/2023 Target Resolution Date: 06/26/2023 Goal Status: Active Interventions: Assess patient/caregiver ability to obtain necessary supplies Assess patient/caregiver ability to perform ulcer/skin care regimen upon admission and as needed Assess ulceration(s) every visit Provide education on smoking Provide education on ulcer and skin care Treatment Activities: Skin care regimen initiated : 03/26/2023 Notes: Electronic Signature(s) Signed: 05/08/2023 4:26:56 PM By: Amy Aver MSN RN CNS WTA Entered By: [name] on 05/08/2023 16:23:48 -------------------------------------------------------------------------------- Pain Assessment Details Patient Name: Date of Service: Amy Keel, MA [name]. 05/08/2023 3:30 PM Medical Record  Number: [id number] Patient Account Number: [id number] Date of Birth/Sex: Treating RN: Aug 22, 1941 (82 y.o. [name] Primary Care Tiwatope Emmitt: [name] Other Clinician: Referring Zabian Swayne: Treating Kemba Hoppes/Extender: [name] Weeks in Treatment: 6 Active Problems Location of Pain Severity and Description of Pain Patient Has Paino No Site Locations [name], [name] ([id number]) [id number] Page 6 of 8 Pain Management and Medication Current Pain Management: Electronic Signature(s) Signed: 05/08/2023 4:26:56 PM By: Amy Aver MSN RN CNS WTA Entered By: [name] on 05/08/2023 15:55:36 -------------------------------------------------------------------------------- Patient/Caregiver Education Details Patient Name: Date of Service: [name], Jaquita Rector [name]. 7/16/2024andnbsp3:30 PM Medical Record Number: [id number] Patient Account Number: [id number] Date of Birth/Gender: Treating RN: 30-Nov-1940 (82 y.o. [name] Primary Care Physician: [name] Other Clinician: Referring Physician: Treating Physician/Extender: [name] Weeks in Treatment: 6 Education Assessment Education Provided To: Patient and Caregiver Education Topics Provided Wound Debridement: Handouts: Wound Debridement Methods: Explain/Verbal Responses: State content correctly Wound/Skin Impairment: Handouts: Caring for Your Ulcer Methods: Explain/Verbal Responses: State content correctly Electronic Signature(s) Signed: 05/08/2023 4:26:56 PM By: Amy Aver MSN RN CNS WTA Entered By: [name] on 05/08/2023 16:24:06 -------------------------------------------------------------------------------- Wound Assessment Details Patient Name: Date of Service: Amy Keel, MA [name]. 05/08/2023 3:30 PM Medical Record Number: [id number] Patient Account Number: [id number] Date of Birth/Sex: Treating RN: 11-13-1940 (82 y.o. [name] Primary Care Laden [place]house: [name] Other Clinician: TENILLE, [name] ([id number]) [id number] Page 7 of 8 Referring Jariel Drost: Treating Tymara Saur/Extender: [name] Weeks in Treatment: 6 Wound Status Wound Number: 2 Primary Abrasion Etiology: Wound Location: Right Abdomen - Lower Quadrant Wound Open Wounding Event: Gradually Appeared Status: Date Acquired: 04/15/2023 Comorbid Chronic Obstructive Pulmonary Disease (COPD), Sleep Apnea, Weeks Of Treatment: 3 History: Congestive Heart Failure, Hypertension, Type II Diabetes Clustered Wound: No Photos Wound Measurements Length: (cm) 0.5 Width: (cm) 1.7 Depth: (cm) 0.1 Area: (cm) 0.668 Volume: (cm) 0.067 % Reduction in Area: -21.5% % Reduction in Volume: 39.1% Epithelialization: None Wound Description Classification: Full Thickness Without Exposed Support Structures Exudate Amount: Medium Exudate Type: Serosanguineous Exudate Color: red, brown Foul Odor After Cleansing: No Slough/Fibrino Yes Wound Bed Granulation Amount: Small (1-33%) Exposed Structure Granulation Quality: Pink Fascia Exposed: No Necrotic Amount: Medium (34-66%) Fat Layer (Subcutaneous Tissue) Exposed: Yes Necrotic Quality: Adherent Slough Tendon Exposed: No Muscle Exposed: No Joint Exposed: No Bone Exposed: No Treatment Notes Wound #2 (Abdomen - Lower Quadrant) Wound Laterality: Right Cleanser Soap and Water Discharge Instruction: Gently cleanse wound with antibacterial soap, rinse and pat dry prior to dressing wounds Peri-Wound Care Topical Primary Dressing Hydrofera Blue Ready Transfer Foam, 2.5x2.5 (in/in) Discharge Instruction: Apply Hydrofera Blue Ready to wound bed as directed Secondary Dressing (BORDER) Zetuvit Plus SILICONE BORDER Dressing 4x4 (  in/in) Discharge Instruction: Please do not put silicone bordered dressings under wraps. Use non-bordered dressing only. Secured  With Compression Wrap Compression Stockings Add-Ons [name], [name] ([id number]) [id number] Page 8 of 8 Electronic Signature(s) Signed: 05/08/2023 4:26:56 PM By: Amy Aver MSN RN CNS WTA Entered By: [name] on 05/08/2023 16:01:15 -------------------------------------------------------------------------------- Vitals Details Patient Name: Date of Service: Amy Keel, MA [name]. 05/08/2023 3:30 PM Medical Record Number: [id number] Patient Account Number: [id number] Date of Birth/Sex: Treating RN: [birth year] (82 y.o. [name] Primary Care Markeem Noreen: [name] Other Clinician: Referring Zehra Rucci: Treating Kaysen Deal/Extender: [name] Weeks in Treatment: 6 Vital Signs Time Taken: 15:52 Temperature (F): 97.9 Height (in): 58 Pulse (bpm): 88 Weight (lbs): 152 Respiratory Rate (breaths/min): 18 Body Mass Index (BMI): 31.8 Blood Pressure (mmHg): 127/77 Reference Range: 80 - 120 mg / dl Electronic Signature(s) Signed: 05/08/2023 4:26:56 PM By: Amy Aver MSN RN CNS WTA Entered By: [name] on 05/08/2023 15:55:29

## 2023-05-08 NOTE — Progress Notes (Signed)
ISIS, COSTANZA A ([id number]) [id number] Page 1 of 8 Visit Report for 05/08/2023 Chief Complaint Document Details Patient Name: Date of Service: [name], [place] A. 05/08/2023 3:30 PM Medical Record Number: [id number] Patient Account Number: [id number] Date of Birth/Sex: Treating RN: 1941-02-27 (82 y.o. [name] Primary Care Provider: Sallyanne Kuster Other Clinician: Referring Provider: Treating Provider/Extender: Weldon Inches Weeks in Treatment: 6 Information Obtained from: Patient Chief Complaint Left gluteal pressure ulcer Electronic Signature(s) Signed: 05/08/2023 3:51:45 PM By: Allen Derry PA-C Entered By: Allen Derry on 05/08/2023 15:51:45 -------------------------------------------------------------------------------- Debridement Details Patient Name: Date of Service: Amy Keel, MA RY A. 05/08/2023 3:30 PM Medical Record Number: [id number] Patient Account Number: [id number] Date of Birth/Sex: Treating RN: Mar 24, 1941 (82 y.o. [name] Primary Care Provider: Sallyanne Kuster Other Clinician: Referring Provider: Treating Provider/Extender: Weldon Inches Weeks in Treatment: 6 Debridement Performed for Assessment: Wound #2 Right Abdomen - Lower Quadrant Performed By: Physician Allen Derry, PA-C Debridement Type: Debridement Level of Consciousness (Pre-procedure): Awake and Alert Pre-procedure Verification/Time Out Yes - 04:17 Taken: Start Time: 04:17 Pain Control: Lidocaine 4% T opical Solution Percent of Wound Bed Debrided: 100% T Area Debrided (cm): otal 0.67 Tissue and other material debrided: Viable, Non-Viable, Slough, Subcutaneous, Slough Level: Skin/Subcutaneous Tissue Debridement Description: Excisional Instrument: Curette Bleeding: Moderate Hemostasis Achieved: Pressure Procedural Pain: 2 Post Procedural Pain: 2 Response to Treatment: Procedure was tolerated well SARYE, KATH A  ([id number]) [id number] Page 2 of 8 Level of Consciousness (Post- Awake and Alert procedure): Post Debridement Measurements of Total Wound Length: (cm) 0.5 Width: (cm) 1.7 Depth: (cm) 0.1 Volume: (cm) 0.067 Character of Wound/Ulcer Post Debridement: Stable Post Procedure Diagnosis Same as Pre-procedure Electronic Signature(s) Signed: 05/08/2023 4:26:56 PM By: Midge Aver MSN RN CNS WTA Signed: 05/08/2023 5:59:11 PM By: Allen Derry PA-C Entered By: Midge Aver on 05/08/2023 16:18:40 -------------------------------------------------------------------------------- HPI Details Patient Name: Date of Service: Amy Keel, MA RY A. 05/08/2023 3:30 PM Medical Record Number: [id number] Patient Account Number: [id number] Date of Birth/Sex: Treating RN: 1941-08-13 (82 y.o. [name] Primary Care Provider: Sallyanne Kuster Other Clinician: Referring Provider: Treating Provider/Extender: Weldon Inches Weeks in Treatment: 6 History of Present Illness HPI Description: 03-26-2023 upon evaluation today patient presents for initial evaluation here in the clinic concerning a wound over the left gluteal region this is a stage II pressure ulcer. Fortunately it does not appear to be too deep at all which is great news and very pleased in that regard. The patient is on Coumadin she also has been using mupirocin topically and has doxycycline of which she has 1 week left. The good news is I think the infection looks to be doing much better. With that being said she does spend most of her time in a recliner and I think that is where a lot of the pressure issues coming from. Patient does have a history of diabetes mellitus type 2, she uses insulin for control, hypertension, COPD, chronic kidney disease stage IV, congestive heart failure, long-term use of anticoagulant therapy due to atrial fibrillation, this specifically his Coumadin, she is legally blind, and is a  current smoker. 04-03-2023 upon evaluation today patient appears to be doing well currently in regard to her wound. This is actually showing signs of excellent improvement. Fortunately there does not appear to be any signs of infection locally nor systemically at this time. With that being said the patient does seem to be making some good progress here in regard to left gluteal  ulcer. She does have a rash on the right gluteal region. This is something that tends to come and go about once a year. This has been going on for a number of years. 04-09-2023 upon evaluation today patient appears to be doing well currently with regard to her wounds. The area of shingles actually appears to be completely dry and healed up. The area where she has more drainage with regard to the primary wound also appears like it could be healed although not 100% sure on this when I would like to monitor for 1 more week before calling it completely healed. Fortunately I do not see any signs of active infection locally nor systemically which is great news. 04-16-2023 upon evaluation today patient appears to be doing excellent currently in regard to her original wound which is good on the gluteal region this is completely healed. 04-24-2023 upon evaluation today patient appears to be doing well currently in regard to her wound this is actually showing signs of improvement. Fortunately I do not see any evidence of active infection locally or systemically which is great news and in general I do believe that we will making good headway towards complete closure. I do not see any signs of infection the wound is very clean today. 05-08-2023 upon evaluation patient appears to be doing about the same in regard to her wound looks cleaner it does not look smaller. We discussed today the possibility of switching over to a different dressing I think this may be staying a little bit too wet with the Xeroform gauze at this point. She  voiced understanding and she is in agreement with that plan. Electronic Signature(s) Signed: 05/09/2023 4:50:30 PM By: Allen Derry PA-C Entered By: Allen Derry on 05/09/2023 16:50:30 Mulhall, Lamar Blinks ([id number]) [id number] Page 3 of 8 -------------------------------------------------------------------------------- Physical Exam Details Patient Name: Date of Service: [name], [place] A. 05/08/2023 3:30 PM Medical Record Number: [id number] Patient Account Number: [id number] Date of Birth/Sex: Treating RN: 11/15/1940 (82 y.o. [name] Primary Care Provider: Sallyanne Kuster Other Clinician: Referring Provider: Treating Provider/Extender: Weldon Inches Weeks in Treatment: 6 Constitutional Well-nourished and well-hydrated in no acute distress. Respiratory normal breathing without difficulty. Psychiatric this patient is able to make decisions and demonstrates good insight into disease process. Alert and Oriented x 3. pleasant and cooperative. Notes Upon inspection patient's wound bed actually showed signs of good granulation and epithelization at this point. I did perform debridement clearway some of the remaining slough and biofilm postdebridement this looks much better and very pleased based on what we are seeing I am actually try Hydrofera Blue in place of the Xeroform at this time. Electronic Signature(s) Signed: 05/09/2023 4:50:44 PM By: Allen Derry PA-C Entered By: Allen Derry on 05/09/2023 16:50:44 -------------------------------------------------------------------------------- Physician Orders Details Patient Name: Date of Service: Amy Keel, MA RY A. 05/08/2023 3:30 PM Medical Record Number: [id number] Patient Account Number: [id number] Date of Birth/Sex: Treating RN: Apr 11, 1941 (82 y.o. [name] Primary Care Provider: Sallyanne Kuster Other Clinician: Referring Provider: Treating Provider/Extender: Weldon Inches Weeks in Treatment: 6 Verbal / Phone Orders: No Diagnosis Coding ICD-10 Coding Code Description [phone] Pressure ulcer of left buttock, stage 2 E11.622 Type 2 diabetes mellitus with other skin ulcer Z79.4 Long term (current) use of insulin B02.9 Zoster without complications I10 Essential (primary) hypertension J44.9 Chronic obstructive pulmonary disease, unspecified Pressley, Quantia A ([id number]) [id number] Page 4 of 8 N18.4 Chronic kidney disease, stage 4 (severe) I50.42 Chronic combined systolic (congestive)  and diastolic (congestive) heart failure Z79.01 Long term (current) use of anticoagulants I48.0 Paroxysmal atrial fibrillation H54.8 Legal blindness, as defined in [place] F17.218 Nicotine dependence, cigarettes, with other nicotine-induced disorders Follow-up Appointments Return Appointment in 1 week. Bathing/ [employer] wounds with antibacterial soap and water. Wound Treatment Wound #2 - Abdomen - Lower Quadrant Wound Laterality: Right Cleanser: Soap and Water Every Other Day/30 Days Discharge Instructions: Gently cleanse wound with antibacterial soap, rinse and pat dry prior to dressing wounds Prim Dressing: Hydrofera Blue Ready Transfer Foam, 2.5x2.5 (in/in) (Generic) Every Other Day/30 Days ary Discharge Instructions: Apply Hydrofera Blue Ready to wound bed as directed Secondary Dressing: (BORDER) Zetuvit Plus SILICONE BORDER Dressing 4x4 (in/in) (DME) (Generic) Every Other Day/30 Days Discharge Instructions: Please do not put silicone bordered dressings under wraps. Use non-bordered dressing only. Electronic Signature(s) Signed: 05/08/2023 4:26:56 PM By: Midge Aver MSN RN CNS WTA Signed: 05/08/2023 5:59:11 PM By: Allen Derry PA-C Entered By: Midge Aver on 05/08/2023 16:23:01 -------------------------------------------------------------------------------- Problem List Details Patient Name: Date of Service: Amy Keel, MA RY A.  05/08/2023 3:30 PM Medical Record Number: [id number] Patient Account Number: [id number] Date of Birth/Sex: Treating RN: 04/27/1941 (82 y.o. [name] Primary Care Provider: Sallyanne Kuster Other Clinician: Referring Provider: Treating Provider/Extender: Weldon Inches Weeks in Treatment: 6 Active Problems ICD-10 Encounter Code Description Active Date MDM Diagnosis [phone] Pressure ulcer of left buttock, stage 2 03/26/2023 No Yes E11.622 Type 2 diabetes mellitus with other skin ulcer 03/26/2023 No Yes Z79.4 Long term (current) use of insulin 03/26/2023 No Yes B02.9 Zoster without complications 04/03/2023 No Yes TRAN, RANDLE A ([id number]) [id number] Page 5 of 8 I10 Essential (primary) hypertension 03/26/2023 No Yes J44.9 Chronic obstructive pulmonary disease, unspecified 03/26/2023 No Yes N18.4 Chronic kidney disease, stage 4 (severe) 03/26/2023 No Yes I50.42 Chronic combined systolic (congestive) and diastolic (congestive) heart failure 03/26/2023 No Yes Z79.01 Long term (current) use of anticoagulants 03/26/2023 No Yes I48.0 Paroxysmal atrial fibrillation 03/26/2023 No Yes H54.8 Legal blindness, as defined in [place] 03/26/2023 No Yes F17.218 Nicotine dependence, cigarettes, with other nicotine-induced disorders 03/26/2023 No Yes Inactive Problems Resolved Problems Electronic Signature(s) Signed: 05/08/2023 3:51:32 PM By: Allen Derry PA-C Entered By: Allen Derry on 05/08/2023 15:51:32 -------------------------------------------------------------------------------- Progress Note Details Patient Name: Date of Service: Amy Keel, MA RY A. 05/08/2023 3:30 PM Medical Record Number: [id number] Patient Account Number: [id number] Date of Birth/Sex: Treating RN: 25-Jun-1941 (82 y.o. [name] Primary Care Provider: Sallyanne Kuster Other Clinician: Referring Provider: Treating Provider/Extender: Weldon Inches Weeks in Treatment:  6 Subjective Chief Complaint Information obtained from Patient Left gluteal pressure ulcer History of Present Illness (HPI) 03-26-2023 upon evaluation today patient presents for initial evaluation here in the clinic concerning a wound over the left gluteal region this is a stage II pressure ulcer. Fortunately it does not appear to be too deep at all which is great news and very pleased in that regard. The patient is on Coumadin she also has been using mupirocin topically and has doxycycline of which she has 1 week left. The good news is I think the infection looks to be doing much better. With that KEYASHA, MIAH A ([id number]) [id number] Page 6 of 8 being said she does spend most of her time in a recliner and I think that is where a lot of the pressure issues coming from. Patient does have a history of diabetes mellitus type 2, she uses insulin for control, hypertension, COPD, chronic kidney disease stage IV, congestive heart  failure, long-term use of anticoagulant therapy due to atrial fibrillation, this specifically his Coumadin, she is legally blind, and is a current smoker. 04-03-2023 upon evaluation today patient appears to be doing well currently in regard to her wound. This is actually showing signs of excellent improvement. Fortunately there does not appear to be any signs of infection locally nor systemically at this time. With that being said the patient does seem to be making some good progress here in regard to left gluteal ulcer. She does have a rash on the right gluteal region. This is something that tends to come and go about once a year. This has been going on for a number of years. 04-09-2023 upon evaluation today patient appears to be doing well currently with regard to her wounds. The area of shingles actually appears to be completely dry and healed up. The area where she has more drainage with regard to the primary wound also appears like it could be  healed although not 100% sure on this when I would like to monitor for 1 more week before calling it completely healed. Fortunately I do not see any signs of active infection locally nor systemically which is great news. 04-16-2023 upon evaluation today patient appears to be doing excellent currently in regard to her original wound which is good on the gluteal region this is completely healed. 04-24-2023 upon evaluation today patient appears to be doing well currently in regard to her wound this is actually showing signs of improvement. Fortunately I do not see any evidence of active infection locally or systemically which is great news and in general I do believe that we will making good headway towards complete closure. I do not see any signs of infection the wound is very clean today. 05-08-2023 upon evaluation patient appears to be doing about the same in regard to her wound looks cleaner it does not look smaller. We discussed today the possibility of switching over to a different dressing I think this may be staying a little bit too wet with the Xeroform gauze at this point. She voiced understanding and she is in agreement with that plan. Objective Constitutional Well-nourished and well-hydrated in no acute distress. Vitals Time Taken: 3:52 PM, Height: 58 in, Weight: 152 lbs, BMI: 31.8, Temperature: 97.9 F, Pulse: 88 bpm, Respiratory Rate: 18 breaths/min, Blood Pressure: 127/77 mmHg. Respiratory normal breathing without difficulty. Psychiatric this patient is able to make decisions and demonstrates good insight into disease process. Alert and Oriented x 3. pleasant and cooperative. General Notes: Upon inspection patient's wound bed actually showed signs of good granulation and epithelization at this point. I did perform debridement clearway some of the remaining slough and biofilm postdebridement this looks much better and very pleased based on what we are seeing I am actually try Hydrofera  Blue in place of the Xeroform at this time. Integumentary (Hair, Skin) Wound #2 status is Open. Original cause of wound was Gradually Appeared. The date acquired was: 04/15/2023. The wound has been in treatment 3 weeks. The wound is located on the Right Abdomen - Lower Quadrant. The wound measures 0.5cm length x 1.7cm width x 0.1cm depth; 0.668cm^2 area and 0.067cm^3 volume. There is Fat Layer (Subcutaneous Tissue) exposed. There is a medium amount of serosanguineous drainage noted. There is small (1-33%) pink granulation within the wound bed. There is a medium (34-66%) amount of necrotic tissue within the wound bed including Adherent Slough. Assessment Active Problems ICD-10 Pressure ulcer of left buttock, stage 2 Type 2  diabetes mellitus with other skin ulcer Long term (current) use of insulin Zoster without complications Essential (primary) hypertension Chronic obstructive pulmonary disease, unspecified Chronic kidney disease, stage 4 (severe) Chronic combined systolic (congestive) and diastolic (congestive) heart failure Long term (current) use of anticoagulants Paroxysmal atrial fibrillation Legal blindness, as defined in [place] Nicotine dependence, cigarettes, with other nicotine-induced disorders Procedures CHYENNE, SOBCZAK A ([id number]) [id number] Page 7 of 8 Wound #2 Pre-procedure diagnosis of Wound #2 is an Abrasion located on the Right Abdomen - Lower Quadrant . There was a Excisional Skin/Subcutaneous Tissue Debridement with a total area of 0.67 sq cm performed by Allen Derry, PA-C. With the following instrument(s): Curette to remove Viable and Non-Viable tissue/material. Material removed includes Subcutaneous Tissue and Slough and after achieving pain control using Lidocaine 4% T opical Solution. No specimens were taken. A time out was conducted at 04:17, prior to the start of the procedure. A Moderate amount of bleeding was controlled with Pressure. The  procedure was tolerated well with a pain level of 2 throughout and a pain level of 2 following the procedure. Post Debridement Measurements: 0.5cm length x 1.7cm width x 0.1cm depth; 0.067cm^3 volume. Character of Wound/Ulcer Post Debridement is stable. Post procedure Diagnosis Wound #2: Same as Pre-Procedure Plan Follow-up Appointments: Return Appointment in 1 week. Bathing/ Shower/ Hygiene: Wash wounds with antibacterial soap and water. WOUND #2: - Abdomen - Lower Quadrant Wound Laterality: Right Cleanser: Soap and Water Every Other Day/30 Days Discharge Instructions: Gently cleanse wound with antibacterial soap, rinse and pat dry prior to dressing wounds Prim Dressing: Hydrofera Blue Ready Transfer Foam, 2.5x2.5 (in/in) (Generic) Every Other Day/30 Days ary Discharge Instructions: Apply Hydrofera Blue Ready to wound bed as directed Secondary Dressing: (BORDER) Zetuvit Plus SILICONE BORDER Dressing 4x4 (in/in) (DME) (Generic) Every Other Day/30 Days Discharge Instructions: Please do not put silicone bordered dressings under wraps. Use non-bordered dressing only. 1. I would recommend currently we switch over to [facility] which I think is good to be a much better option and she is in agreement with that plan. 2. I am also can recommend the patient should continue to monitor for any signs of infection or worsening will use the bordered foam over top. 3. Also will look into not wearing the pants and actually just getting her some dresses which hopefully should prevent some of this from cutting and causing irritation as well. We will see patient back for reevaluation in 1 week here in the clinic. If anything worsens or changes patient will contact our office for additional recommendations. Electronic Signature(s) Signed: 05/09/2023 4:51:09 PM By: Allen Derry PA-C Entered By: Allen Derry on 05/09/2023  16:51:09 -------------------------------------------------------------------------------- SuperBill Details Patient Name: Date of Service: Amy Keel, MA RY A. 05/08/2023 Medical Record Number: [id number] Patient Account Number: [id number] Date of Birth/Sex: Treating RN: 08-23-1941 (82 y.o. [name] Primary Care Provider: Sallyanne Kuster Other Clinician: Referring Provider: Treating Provider/Extender: Weldon Inches Weeks in Treatment: 6 Diagnosis Coding ICD-10 Codes Code Description [phone] Pressure ulcer of left buttock, stage 2 E11.622 Type 2 diabetes mellitus with other skin ulcer Z79.4 Long term (current) use of insulin B02.9 Zoster without complications I10 Essential (primary) hypertension J44.9 Chronic obstructive pulmonary disease, unspecified N18.4 Chronic kidney disease, stage 4 (severe) Gales, Rorie A ([id number]) [id number] Page 8 of 8 I50.42 Chronic combined systolic (congestive) and diastolic (congestive) heart failure Z79.01 Long term (current) use of anticoagulants I48.0 Paroxysmal atrial fibrillation H54.8 Legal blindness, as defined in [place] F17.218 Nicotine dependence, cigarettes, with  other nicotine-induced disorders Facility Procedures : CPT4 Code: [id number] Description: 11042 - DEB SUBQ TISSUE 20 SQ CM/< ICD-10 Diagnosis Description L89.322 Pressure ulcer of left buttock, stage 2 Modifier: Quantity: 1 Physician Procedures : CPT4 Code Description Modifier [id number] 11042 - WC PHYS SUBQ TISS 20 SQ CM ICD-10 Diagnosis Description L89.322 Pressure ulcer of left buttock, stage 2 Quantity: 1 Electronic Signature(s) Signed: 05/08/2023 5:33:20 PM By: Allen Derry PA-C Entered By: Allen Derry on 05/08/2023 17:33:19

## 2023-05-09 DIAGNOSIS — I10 Essential (primary) hypertension: Secondary | ICD-10-CM | POA: Diagnosis not present

## 2023-05-09 DIAGNOSIS — L89322 Pressure ulcer of left buttock, stage 2: Secondary | ICD-10-CM | POA: Diagnosis not present

## 2023-05-09 DIAGNOSIS — E11622 Type 2 diabetes mellitus with other skin ulcer: Secondary | ICD-10-CM | POA: Diagnosis not present

## 2023-05-09 DIAGNOSIS — I480 Paroxysmal atrial fibrillation: Secondary | ICD-10-CM | POA: Diagnosis not present

## 2023-05-09 DIAGNOSIS — S31109A Unspecified open wound of abdominal wall, unspecified quadrant without penetration into peritoneal cavity, initial encounter: Secondary | ICD-10-CM | POA: Diagnosis not present

## 2023-05-09 DIAGNOSIS — I5042 Chronic combined systolic (congestive) and diastolic (congestive) heart failure: Secondary | ICD-10-CM | POA: Diagnosis not present

## 2023-05-15 ENCOUNTER — Other Ambulatory Visit: Payer: Self-pay

## 2023-05-15 ENCOUNTER — Encounter: Payer: Medicare HMO | Admitting: Internal Medicine

## 2023-05-15 DIAGNOSIS — I480 Paroxysmal atrial fibrillation: Secondary | ICD-10-CM | POA: Diagnosis not present

## 2023-05-15 DIAGNOSIS — N184 Chronic kidney disease, stage 4 (severe): Secondary | ICD-10-CM | POA: Diagnosis not present

## 2023-05-15 DIAGNOSIS — S31109A Unspecified open wound of abdominal wall, unspecified quadrant without penetration into peritoneal cavity, initial encounter: Secondary | ICD-10-CM | POA: Diagnosis not present

## 2023-05-15 DIAGNOSIS — L89322 Pressure ulcer of left buttock, stage 2: Secondary | ICD-10-CM | POA: Diagnosis not present

## 2023-05-15 DIAGNOSIS — I5032 Chronic diastolic (congestive) heart failure: Secondary | ICD-10-CM | POA: Diagnosis not present

## 2023-05-15 DIAGNOSIS — I130 Hypertensive heart and chronic kidney disease with heart failure and stage 1 through stage 4 chronic kidney disease, or unspecified chronic kidney disease: Secondary | ICD-10-CM | POA: Diagnosis not present

## 2023-05-15 DIAGNOSIS — E11622 Type 2 diabetes mellitus with other skin ulcer: Secondary | ICD-10-CM | POA: Diagnosis not present

## 2023-05-15 DIAGNOSIS — J449 Chronic obstructive pulmonary disease, unspecified: Secondary | ICD-10-CM | POA: Diagnosis not present

## 2023-05-15 DIAGNOSIS — E1122 Type 2 diabetes mellitus with diabetic chronic kidney disease: Secondary | ICD-10-CM | POA: Diagnosis not present

## 2023-05-15 DIAGNOSIS — H548 Legal blindness, as defined in USA: Secondary | ICD-10-CM | POA: Diagnosis not present

## 2023-05-15 DIAGNOSIS — E1165 Type 2 diabetes mellitus with hyperglycemia: Secondary | ICD-10-CM

## 2023-05-15 MED ORDER — INSULIN GLARGINE 100 UNIT/ML SOLOSTAR PEN
24.0000 [IU] | PEN_INJECTOR | Freq: Every day | SUBCUTANEOUS | Status: DC
Start: 2023-05-15 — End: 2023-05-24

## 2023-05-15 NOTE — Progress Notes (Signed)
ERSIE, SAVINO A ([id number]) [id number] Page 1 of 9 Visit Report for 05/15/2023 Arrival Information Details Patient Name: Date of Service: [name], [place] A. 05/15/2023 3:15 PM Medical Record Number: [id number] Patient Account Number: [id number] Date of Birth/Sex: Treating RN: 1941-07-02 (82 y.o. Freddy Finner Primary Care Ciela Mahajan: Sallyanne Kuster Other Clinician: Referring Texanna Hilburn: Treating Etana Beets/Extender: RO BSO N, MICHA EL Beatrix Shipper, Alyssa Weeks in Treatment: 7 Visit Information History Since Last Visit Added or deleted any medications: No Patient Arrived: Ambulatory Any new allergies or adverse reactions: No Arrival Time: 15:07 Had a fall or experienced change in No Accompanied By: daughter activities of daily living that may affect Transfer Assistance: None risk of falls: Patient Identification Verified: Yes Signs or symptoms of abuse/neglect since last visito No Secondary Verification Process Completed: Yes Hospitalized since last visit: No Patient Requires Transmission-Based Precautions: No Implantable device outside of the clinic excluding No Patient Has Alerts: Yes cellular tissue based products placed in the center Patient Alerts: Patient on Blood Thinner since last visit: Diabetes type 2 Has Dressing in Place as Prescribed: Yes Coumadin Pain Present Now: No Electronic Signature(s) Signed: 05/15/2023 4:16:14 PM By: Yevonne Pax RN Entered By: Yevonne Pax on 05/15/2023 15:07:37 -------------------------------------------------------------------------------- Clinic Level of Care Assessment Details Patient Name: Date of Service: [name], [place] A. 05/15/2023 3:15 PM Medical Record Number: [id number] Patient Account Number: [id number] Date of Birth/Sex: Treating RN: Aug 14, 1941 (82 y.o. Freddy Finner Primary Care Lyllian Gause: Sallyanne Kuster Other Clinician: Referring Yen Wandell: Treating Samanda Buske/Extender: RO BSO N, MICHA EL  Beatrix Shipper, Alyssa Weeks in Treatment: 7 Clinic Level of Care Assessment Items TOOL 4 Quantity Score X- 1 0 Use when only an EandM is performed on FOLLOW-UP visit ASSESSMENTS - Nursing Assessment / Reassessment X- 1 10 Reassessment of Co-morbidities (includes updates in patient status) X- 1 5 Reassessment of Adherence to Treatment Plan SATSUKI, ZILLMER A ([id number]) [id number] Page 2 of 9 ASSESSMENTS - Wound and Skin A ssessment / Reassessment X - Simple Wound Assessment / Reassessment - one wound 1 5 []  - 0 Complex Wound Assessment / Reassessment - multiple wounds []  - 0 Dermatologic / Skin Assessment (not related to wound area) ASSESSMENTS - Focused Assessment []  - 0 Circumferential Edema Measurements - multi extremities []  - 0 Nutritional Assessment / Counseling / Intervention []  - 0 Lower Extremity Assessment (monofilament, tuning fork, pulses) []  - 0 Peripheral Arterial Disease Assessment (using hand held doppler) ASSESSMENTS - Ostomy and/or Continence Assessment and Care []  - 0 Incontinence Assessment and Management []  - 0 Ostomy Care Assessment and Management (repouching, etc.) PROCESS - Coordination of Care X - Simple Patient / Family Education for ongoing care 1 15 []  - 0 Complex (extensive) Patient / Family Education for ongoing care []  - 0 Staff obtains Chiropractor, Records, T Results / Process Orders est []  - 0 Staff telephones HHA, Nursing Homes / Clarify orders / etc []  - 0 Routine Transfer to another Facility (non-emergent condition) []  - 0 Routine Hospital Admission (non-emergent condition) []  - 0 New Admissions / Manufacturing engineer / Ordering NPWT Apligraf, etc. , []  - 0 Emergency Hospital Admission (emergent condition) X- 1 10 Simple Discharge Coordination []  - 0 Complex (extensive) Discharge Coordination PROCESS - Special Needs []  - 0 Pediatric / Minor Patient Management []  - 0 Isolation Patient Management []  -  0 Hearing / Language / Visual special needs []  - 0 Assessment of Community assistance (transportation, D/C planning, etc.) []  - 0 Additional assistance / Altered mentation []  -  0 Support Surface(s) Assessment (bed, cushion, seat, etc.) INTERVENTIONS - Wound Cleansing / Measurement X - Simple Wound Cleansing - one wound 1 5 []  - 0 Complex Wound Cleansing - multiple wounds X- 1 5 Wound Imaging (photographs - any number of wounds) []  - 0 Wound Tracing (instead of photographs) X- 1 5 Simple Wound Measurement - one wound []  - 0 Complex Wound Measurement - multiple wounds INTERVENTIONS - Wound Dressings X - Small Wound Dressing one or multiple wounds 1 10 []  - 0 Medium Wound Dressing one or multiple wounds []  - 0 Large Wound Dressing one or multiple wounds []  - 0 Application of Medications - topical []  - 0 Application of Medications - injection INTERVENTIONS - Miscellaneous []  - 0 External ear exam PROVIDENCE, STIVERS A ([id number]) [phone].pdf Page 3 of 9 []  - 0 Specimen Collection (cultures, biopsies, blood, body fluids, etc.) []  - 0 Specimen(s) / Culture(s) sent or taken to Lab for analysis []  - 0 Patient Transfer (multiple staff / [place]iel Sites Lift / Similar devices) []  - 0 Simple Staple / Suture removal (25 or less) []  - 0 Complex Staple / Suture removal (26 or more) []  - 0 Hypo / Hyperglycemic Management (close monitor of Blood Glucose) []  - 0 Ankle / Brachial Index (ABI) - do not check if billed separately X- 1 5 Vital Signs Has the patient been seen at the hospital within the last three years: Yes Total Score: 75 Level Of Care: New/Established - Level 2 Electronic Signature(s) Signed: 05/15/2023 4:16:14 PM By: Yevonne Pax RN Entered By: Yevonne Pax on 05/15/2023 15:56:25 -------------------------------------------------------------------------------- Encounter Discharge Information Details Patient Name: Date of Service: Amy Keel, MA RY A.  05/15/2023 3:15 PM Medical Record Number: [id number] Patient Account Number: [id number] Date of Birth/Sex: Treating RN: Feb 24, 1941 (82 y.o. Freddy Finner Primary Care Lauraann Missey: Sallyanne Kuster Other Clinician: Referring Eunice Oldaker: Treating Aylan Bayona/Extender: RO BSO N, MICHA EL Beatrix Shipper, Alyssa Weeks in Treatment: 7 Encounter Discharge Information Items Discharge Condition: Stable Ambulatory Status: Ambulatory Discharge Destination: Home Transportation: Private Auto Accompanied By: daughter Schedule Follow-up Appointment: Yes Clinical Summary of Care: Electronic Signature(s) Signed: 05/15/2023 4:16:14 PM By: Yevonne Pax RN Entered By: Yevonne Pax on 05/15/2023 15:57:04 -------------------------------------------------------------------------------- Lower Extremity Assessment Details Patient Name: Date of Service: [name], [place] RY A. 05/15/2023 3:15 PM [name] A ([id number]) [phone].pdf Page 4 of 9 Medical Record Number: [id number] Patient Account Number: [id number] Date of Birth/Sex: Treating RN: January 24, 1941 (82 y.o. Freddy Finner Primary Care Channa Hazelett: Sallyanne Kuster Other Clinician: Referring Ramel Tobon: Treating Bethanny Toelle/Extender: RO BSO N, MICHA EL Beatrix Shipper, Alyssa Weeks in Treatment: 7 Electronic Signature(s) Signed: 05/15/2023 4:16:14 PM By: Yevonne Pax RN Entered By: Yevonne Pax on 05/15/2023 15:10:41 -------------------------------------------------------------------------------- Multi Wound Chart Details Patient Name: Date of Service: Amy Keel, MA RY A. 05/15/2023 3:15 PM Medical Record Number: [id number] Patient Account Number: [id number] Date of Birth/Sex: Treating RN: 1941/01/05 (82 y.o. Freddy Finner Primary Care Georgeana Oertel: Sallyanne Kuster Other Clinician: Referring Mylani Gentry: Treating Anson Peddie/Extender: RO BSO N, MICHA EL Beatrix Shipper, Alyssa Weeks in Treatment: 7 Vital Signs Height(in): 58 Pulse(bpm): 86 Weight(lbs):  152 Blood Pressure(mmHg): 125/72 Body Mass Index(BMI): 31.8 Temperature(F): 97.9 Respiratory Rate(breaths/min): 18 [2:Photos:] [N/A:N/A] Right Abdomen - Lower Quadrant N/A N/A Wound Location: Gradually Appeared N/A N/A Wounding Event: Abrasion N/A N/A Primary Etiology: Chronic Obstructive Pulmonary N/A N/A Comorbid History: Disease (COPD), Sleep Apnea, Congestive Heart Failure, Hypertension, Type II Diabetes 04/15/2023 N/A N/A Date Acquired: 4 N/A N/A Weeks of Treatment: Open N/A N/A Wound Status: No  N/A N/A Wound Recurrence: 0.4x1.6x0.1 N/A N/A Measurements L x W x D (cm) 0.503 N/A N/A A (cm) : rea 0.05 N/A N/A Volume (cm) : 8.50% N/A N/A % Reduction in Area: 54.50% N/A N/A % Reduction in Volume: Full Thickness Without Exposed N/A N/A Classification: Support Structures Medium N/A N/A Exudate Amount: Serosanguineous N/A N/A Exudate Type: red, brown N/A N/A Exudate Color: Small (1-33%) N/A N/A Granulation Amount: Pink N/A N/A Granulation Quality: Medium (34-66%) N/A N/A Necrotic Amount: Fat Layer (Subcutaneous Tissue): Yes N/A N/A Exposed Structures: Fascia: No IVANNIA, WILLHELM A ([id number]) [id number] Page 5 of 9 Tendon: No Muscle: No Joint: No Bone: No None N/A N/A Epithelialization: Treatment Notes Electronic Signature(s) Signed: 05/15/2023 4:16:14 PM By: Yevonne Pax RN Entered By: Yevonne Pax on 05/15/2023 15:10:46 -------------------------------------------------------------------------------- Multi-Disciplinary Care Plan Details Patient Name: Date of Service: Amy Keel, MA RY A. 05/15/2023 3:15 PM Medical Record Number: [id number] Patient Account Number: [id number] Date of Birth/Sex: Treating RN: Feb 24, 1941 (82 y.o. Freddy Finner Primary Care Jarelis Ehlert: Sallyanne Kuster Other Clinician: Referring Keirstin Musil: Treating Khoa Opdahl/Extender: RO BSO N, MICHA EL Beatrix Shipper, Alyssa Weeks in Treatment: 7 Active  Inactive Wound/Skin Impairment Nursing Diagnoses: Impaired tissue integrity Knowledge deficit related to smoking impact on wound healing Knowledge deficit related to ulceration/compromised skin integrity Goals: Patient will demonstrate a reduced rate of smoking or cessation of smoking Date Initiated: 03/26/2023 Date Inactivated: 04/09/2023 Target Resolution Date: 04/26/2023 Goal Status: Met Ulcer/skin breakdown will have a volume reduction of 30% by week 4 Date Initiated: 03/26/2023 Date Inactivated: 04/24/2023 Target Resolution Date: 04/25/2023 Goal Status: Met Ulcer/skin breakdown will have a volume reduction of 50% by week 8 Date Initiated: 03/26/2023 Target Resolution Date: 05/26/2023 Goal Status: Active Ulcer/skin breakdown will have a volume reduction of 80% by week 12 Date Initiated: 03/26/2023 Target Resolution Date: 06/26/2023 Goal Status: Active Interventions: Assess patient/caregiver ability to obtain necessary supplies Assess patient/caregiver ability to perform ulcer/skin care regimen upon admission and as needed Assess ulceration(s) every visit Provide education on smoking Provide education on ulcer and skin care Treatment Activities: Skin care regimen initiated : 03/26/2023 Notes: Electronic Signature(s) Signed: 05/15/2023 4:16:14 PM By: Yevonne Pax RN Welke, Ember A ([id number]) [id number] Page 6 of 9 Entered By: Yevonne Pax on 05/15/2023 15:11:00 -------------------------------------------------------------------------------- Pain Assessment Details Patient Name: Date of Service: [name], [place] A. 05/15/2023 3:15 PM Medical Record Number: [id number] Patient Account Number: [id number] Date of Birth/Sex: Treating RN: 1941/06/13 (82 y.o. Freddy Finner Primary Care Addylin Manke: Sallyanne Kuster Other Clinician: Referring Zoriyah Scheidegger: Treating Karlon Schlafer/Extender: RO BSO N, MICHA EL Beatrix Shipper, Alyssa Weeks in Treatment: 7 Active Problems Location of  Pain Severity and Description of Pain Patient Has Paino No Site Locations Pain Management and Medication Current Pain Management: Electronic Signature(s) Signed: 05/15/2023 4:16:14 PM By: Yevonne Pax RN Entered By: Yevonne Pax on 05/15/2023 15:08:05 -------------------------------------------------------------------------------- Patient/Caregiver Education Details Patient Name: Date of Service: Huston Foley A. 7/23/2024andnbsp3:15 PM Medical Record Number: [id number] Patient Account Number: [id number] Date of Birth/Gender: Treating RN: [birth year] (82 y.o. Freddy Finner Primary Care Physician: Sallyanne Kuster Other Clinician: EVELYN, AGUINALDO ([id number]) [id number] Page 7 of 9 Referring Physician: Treating Physician/Extender: RO BSO N, MICHA EL Erin Fulling Weeks in Treatment: 7 Education Assessment Education Provided To: Patient Education Topics Provided Wound/Skin Impairment: Handouts: Caring for Your Ulcer Methods: Explain/Verbal Responses: State content correctly Electronic Signature(s) Signed: 05/15/2023 4:16:14 PM By: Yevonne Pax RN Entered By: Yevonne Pax on 05/15/2023 15:11:14 -------------------------------------------------------------------------------- Wound Assessment Details Patient Name: Date of  Service: Amy Keel, MA RY A. 05/15/2023 3:15 PM Medical Record Number: [id number] Patient Account Number: [id number] Date of Birth/Sex: Treating RN: Jul 08, 1941 (82 y.o. Freddy Finner Primary Care Alfreida Steffenhagen: Sallyanne Kuster Other Clinician: Referring Fionn Stracke: Treating Patty Lopezgarcia/Extender: RO BSO N, MICHA EL Beatrix Shipper, Alyssa Weeks in Treatment: 7 Wound Status Wound Number: 2 Primary Abrasion Etiology: Wound Location: Right Abdomen - Lower Quadrant Wound Open Wounding Event: Gradually Appeared Status: Date Acquired: 04/15/2023 Comorbid Chronic Obstructive Pulmonary Disease (COPD), Sleep Apnea, Weeks Of Treatment:  4 History: Congestive Heart Failure, Hypertension, Type II Diabetes Clustered Wound: No Photos Wound Measurements Length: (cm) 0.4 Width: (cm) 1.6 Depth: (cm) 0.1 Area: (cm) 0.503 Volume: (cm) 0.05 % Reduction in Area: 8.5% % Reduction in Volume: 54.5% Epithelialization: None Tunneling: No Undermining: No Wound Description Asbury, Mariama A ([id number]) Classification: Full Thickness Without Exposed Support Structures Exudate Amount: Medium Exudate Type: Serosanguineous Exudate Color: red, brown [id number] Page 8 of 9 Foul Odor After Cleansing: No Slough/Fibrino Yes Wound Bed Granulation Amount: Small (1-33%) Exposed Structure Granulation Quality: Pink Fascia Exposed: No Necrotic Amount: Medium (34-66%) Fat Layer (Subcutaneous Tissue) Exposed: Yes Necrotic Quality: Adherent Slough Tendon Exposed: No Muscle Exposed: No Joint Exposed: No Bone Exposed: No Treatment Notes Wound #2 (Abdomen - Lower Quadrant) Wound Laterality: Right Cleanser Soap and Water Discharge Instruction: Gently cleanse wound with antibacterial soap, rinse and pat dry prior to dressing wounds Peri-Wound Care Topical Primary Dressing Hydrofera Blue Ready Transfer Foam, 2.5x2.5 (in/in) Discharge Instruction: Apply Hydrofera Blue Ready to wound bed as directed Secondary Dressing (BORDER) Zetuvit Plus SILICONE BORDER Dressing 4x4 (in/in) Discharge Instruction: Please do not put silicone bordered dressings under wraps. Use non-bordered dressing only. Secured With Compression Wrap Compression Stockings Facilities manager) Signed: 05/15/2023 4:16:14 PM By: Yevonne Pax RN Entered By: Yevonne Pax on 05/15/2023 15:10:32 -------------------------------------------------------------------------------- Vitals Details Patient Name: Date of Service: Amy Keel, MA RY A. 05/15/2023 3:15 PM Medical Record Number: [id number] Patient Account Number: [id number] Date of  Birth/Sex: Treating RN: 1941-03-17 (82 y.o. Freddy Finner Primary Care Desta Bujak: Sallyanne Kuster Other Clinician: Referring Lauri Purdum: Treating Marquell Saenz/Extender: RO BSO N, MICHA EL Beatrix Shipper, Alyssa Weeks in Treatment: 7 Vital Signs Time Taken: 15:07 Temperature (F): 97.9 Height (in): 58 Pulse (bpm): 86 Weight (lbs): 152 Respiratory Rate (breaths/min): 18 Body Mass Index (BMI): 31.8 Blood Pressure (mmHg): 125/72 Reference Range: 80 - 120 mg / dl DHANA, TOTTON A ([id number]) [phone].pdf Page 9 of 9 Electronic Signature(s) Signed: 05/15/2023 4:16:14 PM By: Yevonne Pax RN Entered By: Yevonne Pax on 05/15/2023 15:07:54

## 2023-05-15 NOTE — Progress Notes (Signed)
[name] ([id number]) [id number] Page 1 of 6 Visit Report for 05/15/2023 HPI Details Patient Name: Date of Service: [name], [name]. 05/15/2023 3:15 PM Medical Record Number: [id number] Patient Account Number: [id number] Date of Birth/Sex: Treating RN: [birth year] (82 y.o. Freddy Finner Primary Care Provider: Sallyanne Kuster Other Clinician: Referring Provider: Treating Provider/Extender: RO BSO N, MICHA EL Beatrix Shipper, Alyssa Weeks in Treatment: 7 History of Present Illness HPI Description: 03-26-2023 upon evaluation today patient presents for initial evaluation here in the clinic concerning [name] over the left gluteal region this is [name] stage II pressure ulcer. Fortunately it does not appear to be too deep at all which is great news and very pleased in that regard. The patient is on Coumadin she also has been using mupirocin topically and has doxycycline of which she has 1 week left. The good news is I think the infection looks to be doing much better. With that being said she does spend most of her time in [name] recliner and I think that is where [name] coming from. Patient does have [name] history of diabetes mellitus type 2, she uses insulin for control, hypertension, COPD, chronic kidney disease stage IV, congestive heart failure, long-term use of anticoagulant therapy due to atrial fibrillation, this specifically his Coumadin, she is legally blind, and is [name] current smoker. 04-03-2023 upon evaluation today patient appears to be doing well currently in regard to her [name]. This is actually showing signs of excellent improvement. Fortunately there does not appear to be any signs of infection locally nor systemically at this time. With that being said the patient does seem to be making some good progress here in regard to left gluteal ulcer. She does have [name] rash on the right gluteal region. This is something that tends to come and go about once  [name] year. This has been going on for [name] number of years. 04-09-2023 upon evaluation today patient appears to be doing well currently with regard to her wounds. The area of shingles actually appears to be completely dry and healed up. The area where she has more drainage with regard to the primary [name] also appears like it could be healed although not 100% sure on this when I would like to monitor for 1 more week before calling it completely healed. Fortunately I do not see any signs of active infection locally nor systemically which is great news. 04-16-2023 upon evaluation today patient appears to be doing excellent currently in regard to her original [name] which is good on the gluteal region this is completely healed. 04-24-2023 upon evaluation today patient appears to be doing well currently in regard to her [name] this is actually showing signs of improvement. Fortunately I do not see any evidence of active infection locally or systemically which is great news and in general I do believe that we will making good headway towards complete closure. I do not see any signs of infection the [name] is very clean today. 05-08-2023 upon evaluation patient appears to be doing about the same in regard to her [name] looks cleaner it does not look smaller. We discussed today the possibility of switching over to [name] different dressing I think this may be staying [name] little bit too wet with the Xeroform gauze at this point. She voiced understanding and she is in agreement with that plan. 7/23; this is [name] patient with [name] small [name] in her right upper quadrant which is at the tail of  [name] large old cholecystectomy scar. Equally problematic is [name] large hernia above this area. Switch to [facility] Blue last week it does not have any depth and appears to have improved in surface area. Electronic Signature(s) Signed: 05/15/2023 4:43:46 PM By: Baltazar Najjar MD Entered By: Baltazar Najjar on 05/15/2023  16:12:26 -------------------------------------------------------------------------------- Physical Exam Details Patient Name: Date of Service: [name]. 05/15/2023 3:15 PM [name] ([id number]) [id number] Page 2 of 6 Medical Record Number: [id number] Patient Account Number: [id number] Date of Birth/Sex: Treating RN: Dec 26, 1940 (82 y.o. Freddy Finner Primary Care Provider: Sallyanne Kuster Other Clinician: Referring Provider: Treating Provider/Extender: RO BSO N, MICHA EL Beatrix Shipper, Alyssa Weeks in Treatment: 7 Constitutional Sitting or standing Blood Pressure is within target range for patient.. Pulse regular and within target range for patient.Marland Kitchen Respirations regular, non-labored and within target range.. Temperature is normal and within the target range for the patient.Marland Kitchen appears in no distress. Notes [name] exam; right upper quadrant small oval [name] with what appears to be cream granulation it is at the most lateral part of the long old cholecystectomy scar. Above this there is what appears to be [name] nonreducible abdominal wall hernia. This is nonacute but tender with palpation. This forms [name] fold and [name] crevice over the cholecystectomy scar against the other part of her abdomen which probably contributed to the genesis of this [name] Electronic Signature(s) Signed: 05/15/2023 4:43:46 PM By: Baltazar Najjar MD Entered By: Baltazar Najjar on 05/15/2023 16:13:52 -------------------------------------------------------------------------------- Physician Orders Details Patient Name: Date of Service: [name]. 05/15/2023 3:15 PM Medical Record Number: [id number] Patient Account Number: [id number] Date of Birth/Sex: Treating RN: 1941-08-03 (82 y.o. Freddy Finner Primary Care Provider: Sallyanne Kuster Other Clinician: Referring Provider: Treating Provider/Extender: RO BSO N, MICHA EL Beatrix Shipper, Alyssa Weeks in Treatment: 7 Verbal /  Phone Orders: No Diagnosis Coding Follow-up Appointments Return Appointment in 1 week. Bathing/ [employer] wounds with antibacterial soap and water. [name] Treatment [name] #2 - Abdomen - Lower Quadrant [name] Laterality: Right Cleanser: Soap and Water Every Other Day/30 Days Discharge Instructions: Gently cleanse [name] with antibacterial soap, rinse and pat dry prior to dressing wounds Prim Dressing: Hydrofera Blue Ready Transfer Foam, 2.5x2.5 (in/in) (Generic) Every Other Day/30 Days ary Discharge Instructions: Apply Hydrofera Blue Ready to [name] bed as directed Secondary Dressing: (BORDER) Zetuvit Plus SILICONE BORDER Dressing 4x4 (in/in) (Generic) Every Other Day/30 Days Discharge Instructions: Please do not put silicone bordered dressings under wraps. Use non-bordered dressing only. Electronic Signature(s) Signed: 05/15/2023 4:16:14 PM By: Yevonne Pax RN Signed: 05/15/2023 4:43:46 PM By: Baltazar Najjar MD Entered By: Yevonne Pax on 05/15/2023 15:56:01 [place], [name] ([id number]) [id number] Page 3 of 6 -------------------------------------------------------------------------------- Problem List Details Patient Name: Date of Service: [name], [name]. 05/15/2023 3:15 PM Medical Record Number: [id number] Patient Account Number: [id number] Date of Birth/Sex: Treating RN: January 09, 1941 (82 y.o. Freddy Finner Primary Care Provider: Sallyanne Kuster Other Clinician: Referring Provider: Treating Provider/Extender: RO BSO N, MICHA EL Beatrix Shipper, Alyssa Weeks in Treatment: 7 Active Problems ICD-10 Encounter Code Description Active Date MDM Diagnosis S31.100D Unspecified open [name] of abdominal wall, right upper quadrant without 05/15/2023 No Yes penetration into peritoneal cavity, subsequent encounter E11.622 Type 2 diabetes mellitus with other skin ulcer 03/26/2023 No Yes Z79.4 Long term (current) use of insulin 03/26/2023 No Yes B02.9 Zoster  without complications 04/03/2023 No Yes I10 Essential (primary) hypertension 03/26/2023 No Yes J44.9 Chronic obstructive pulmonary disease, unspecified 03/26/2023 No Yes  N18.4 Chronic kidney disease, stage 4 (severe) 03/26/2023 No Yes I50.42 Chronic combined systolic (congestive) and diastolic (congestive) heart failure 03/26/2023 No Yes Z79.01 Long term (current) use of anticoagulants 03/26/2023 No Yes I48.0 Paroxysmal atrial fibrillation 03/26/2023 No Yes H54.8 Legal blindness, as defined in [place] 03/26/2023 No Yes F17.218 Nicotine dependence, cigarettes, with other nicotine-induced disorders 03/26/2023 No Yes K45.0 Other specified abdominal hernia with obstruction, without gangrene 05/15/2023 No Yes AVIELLA, DISBROW [name] ([id number]) [id number] Page 4 of 6 Inactive Problems ICD-10 Code Description Active Date Inactive Date L89.322 Pressure ulcer of left buttock, stage 2 03/26/2023 03/26/2023 Resolved Problems Electronic Signature(s) Signed: 05/15/2023 4:43:46 PM By: Baltazar Najjar MD Entered By: Baltazar Najjar on 05/15/2023 16:10:12 -------------------------------------------------------------------------------- Progress Note Details Patient Name: Date of Service: [name]. 05/15/2023 3:15 PM Medical Record Number: [id number] Patient Account Number: [id number] Date of Birth/Sex: Treating RN: 03-20-1941 (82 y.o. Freddy Finner Primary Care Provider: Sallyanne Kuster Other Clinician: Referring Provider: Treating Provider/Extender: RO BSO N, MICHA EL Beatrix Shipper, Alyssa Weeks in Treatment: 7 Subjective History of Present Illness (HPI) 03-26-2023 upon evaluation today patient presents for initial evaluation here in the clinic concerning [name] over the left gluteal region this is [name] stage II pressure ulcer. Fortunately it does not appear to be too deep at all which is great news and very pleased in that regard. The patient is on Coumadin she also has been using mupirocin  topically and has doxycycline of which she has 1 week left. The good news is I think the infection looks to be doing much better. With that being said she does spend most of her time in [name] recliner and I think that is where [name] coming from. Patient does have [name] history of diabetes mellitus type 2, she uses insulin for control, hypertension, COPD, chronic kidney disease stage IV, congestive heart failure, long-term use of anticoagulant therapy due to atrial fibrillation, this specifically his Coumadin, she is legally blind, and is [name] current smoker. 04-03-2023 upon evaluation today patient appears to be doing well currently in regard to her [name]. This is actually showing signs of excellent improvement. Fortunately there does not appear to be any signs of infection locally nor systemically at this time. With that being said the patient does seem to be making some good progress here in regard to left gluteal ulcer. She does have [name] rash on the right gluteal region. This is something that tends to come and go about once [name] year. This has been going on for [name] number of years. 04-09-2023 upon evaluation today patient appears to be doing well currently with regard to her wounds. The area of shingles actually appears to be completely dry and healed up. The area where she has more drainage with regard to the primary [name] also appears like it could be healed although not 100% sure on this when I would like to monitor for 1 more week before calling it completely healed. Fortunately I do not see any signs of active infection locally nor systemically which is great news. 04-16-2023 upon evaluation today patient appears to be doing excellent currently in regard to her original [name] which is good on the gluteal region this is completely healed. 04-24-2023 upon evaluation today patient appears to be doing well currently in regard to her [name] this is actually showing signs of improvement. Fortunately  I do not see any evidence of active infection locally or systemically which is great news and in general  I do believe that we will making good headway towards complete closure. I do not see any signs of infection the [name] is very clean today. 05-08-2023 upon evaluation patient appears to be doing about the same in regard to her [name] looks cleaner it does not look smaller. We discussed today the possibility of switching over to [name] different dressing I think this may be staying [name] little bit too wet with the Xeroform gauze at this point. She voiced understanding and she is in agreement with that plan. 7/23; this is [name] patient with [name] small [name] in her right upper quadrant which is at the tail of [name] large old cholecystectomy scar. Equally problematic is [name] large hernia above this area. Switch to [facility] Blue last week it does not have any depth and appears to have improved in surface area. [name], [name] ([id number]) [id number] Page 5 of 6 Objective Constitutional Sitting or standing Blood Pressure is within target range for patient.. Pulse regular and within target range for patient.Marland Kitchen Respirations regular, non-labored and within target range.. Temperature is normal and within the target range for the patient.Marland Kitchen appears in no distress. Vitals Time Taken: 3:07 PM, Height: 58 in, Weight: 152 lbs, BMI: 31.8, Temperature: 97.9 F, Pulse: 86 bpm, Respiratory Rate: 18 breaths/min, Blood Pressure: 125/72 mmHg. General Notes: [name] exam; right upper quadrant small oval [name] with what appears to be cream granulation it is at the most lateral part of the long old cholecystectomy scar. Above this there is what appears to be [name] nonreducible abdominal wall hernia. This is nonacute but tender with palpation. This forms [name] fold and [name] crevice over the cholecystectomy scar against the other part of her abdomen which probably contributed to the genesis of this [name] Integumentary (Hair,  Skin) [name] #2 status is Open. Original cause of [name] was Gradually Appeared. The date acquired was: 04/15/2023. The [name] has been in treatment 4 weeks. The [name] is located on the Right Abdomen - Lower Quadrant. The [name] measures 0.4cm length x 1.6cm width x 0.1cm depth; 0.503cm^2 area and 0.05cm^3 volume. There is Fat Layer (Subcutaneous Tissue) exposed. There is no tunneling or undermining noted. There is [name] medium amount of serosanguineous drainage noted. There is small (1-33%) pink granulation within the [name] bed. There is [name] medium (34-66%) amount of necrotic tissue within the [name] bed including Adherent Slough. Assessment Active Problems ICD-10 Unspecified open [name] of abdominal wall, right upper quadrant without penetration into peritoneal cavity, subsequent encounter Type 2 diabetes mellitus with other skin ulcer Long term (current) use of insulin Zoster without complications Essential (primary) hypertension Chronic obstructive pulmonary disease, unspecified Chronic kidney disease, stage 4 (severe) Chronic combined systolic (congestive) and diastolic (congestive) heart failure Long term (current) use of anticoagulants Paroxysmal atrial fibrillation Legal blindness, as defined in [place] Nicotine dependence, cigarettes, with other nicotine-induced disorders Other specified abdominal hernia with obstruction, without gangrene Plan Follow-up Appointments: Return Appointment in 1 week. Bathing/ Shower/ Hygiene: Wash wounds with antibacterial soap and water. [name] #2: - Abdomen - Lower Quadrant [name] Laterality: Right Cleanser: Soap and Water Every Other Day/30 Days Discharge Instructions: Gently cleanse [name] with antibacterial soap, rinse and pat dry prior to dressing wounds Prim Dressing: Hydrofera Blue Ready Transfer Foam, 2.5x2.5 (in/in) (Generic) Every Other Day/30 Days ary Discharge Instructions: Apply Hydrofera Blue Ready to [name] bed as directed Secondary Dressing:  (BORDER) Zetuvit Plus SILICONE BORDER Dressing 4x4 (in/in) (Generic) Every Other Day/30 Days Discharge Instructions: Please do not put silicone bordered dressings under wraps.  Use non-bordered dressing only. 1. The [name] looks clean we are continuing with the Hydrofera Blue and Zetuvit 2 large abdominal wall hernia in the right upper quadrant above the [name] area no doubt contributes to why this [name] formed in the first place and I talked to the patient and her family about this. They state that they have been to see [name] surgeon and she is not felt to be [name] surgical candidate Electronic Signature(s) Signed: 05/15/2023 4:43:46 PM By: Baltazar Najjar MD Entered By: Baltazar Najjar on 05/15/2023 16:15:31 [place], [name] ([id number]) [id number] Page 6 of 6 -------------------------------------------------------------------------------- SuperBill Details Patient Name: Date of Service: [name], [place] [facility] [name]. 05/15/2023 Medical Record Number: [id number] Patient Account Number: [id number] Date of Birth/Sex: Treating RN: 08-29-1941 (82 y.o. Freddy Finner Primary Care Provider: Sallyanne Kuster Other Clinician: Referring Provider: Treating Provider/Extender: RO BSO N, MICHA EL Beatrix Shipper, Alyssa Weeks in Treatment: 7 Diagnosis Coding ICD-10 Codes Code Description S31.100D Unspecified open [name] of abdominal wall, right upper quadrant without penetration into peritoneal cavity, subsequent encounter E11.622 Type 2 diabetes mellitus with other skin ulcer Z79.4 Long term (current) use of insulin B02.9 Zoster without complications I10 Essential (primary) hypertension J44.9 Chronic obstructive pulmonary disease, unspecified N18.4 Chronic kidney disease, stage 4 (severe) I50.42 Chronic combined systolic (congestive) and diastolic (congestive) heart failure Z79.01 Long term (current) use of anticoagulants I48.0 Paroxysmal atrial fibrillation H54.8 Legal blindness, as defined in  [place] F17.218 Nicotine dependence, cigarettes, with other nicotine-induced disorders K45.0 Other specified abdominal hernia with obstruction, without gangrene Facility Procedures : CPT4 Code: [id number] Description: 86578 - [name] CARE VISIT-LEV 2 EST PT Modifier: Quantity: 1 Physician Procedures : CPT4 Code Description Modifier [id number] - WC PHYS LEVEL 3 - EST PT ICD-10 Diagnosis Description S31.100D Unspecified open [name] of abdominal wall, right upper quadrant without penetration into peritoneal cavi subsequent encounter K45.0 Other  specified abdominal hernia with obstruction, without gangrene Quantity: 1 ty, Electronic Signature(s) Signed: 05/15/2023 4:43:46 PM By: Baltazar Najjar MD Previous Signature: 05/15/2023 4:16:14 PM Version By: Yevonne Pax RN Entered By: Baltazar Najjar on 05/15/2023 16:16:07

## 2023-05-21 ENCOUNTER — Inpatient Hospital Stay
Admission: EM | Admit: 2023-05-21 | Discharge: 2023-05-23 | DRG: 191 | Disposition: A | Discharge status: Home / Self Care | Payer: Medicare HMO | Attending: Internal Medicine | Admitting: Internal Medicine

## 2023-05-21 ENCOUNTER — Emergency Department: Payer: Medicare HMO

## 2023-05-21 ENCOUNTER — Encounter: Payer: Self-pay | Admitting: Internal Medicine

## 2023-05-21 ENCOUNTER — Other Ambulatory Visit: Payer: Self-pay

## 2023-05-21 DIAGNOSIS — Z90710 Acquired absence of both cervix and uterus: Secondary | ICD-10-CM

## 2023-05-21 DIAGNOSIS — Z833 Family history of diabetes mellitus: Secondary | ICD-10-CM

## 2023-05-21 DIAGNOSIS — Z6841 Body Mass Index (BMI) 40.0 and over, adult: Secondary | ICD-10-CM

## 2023-05-21 DIAGNOSIS — I5043 Acute on chronic combined systolic (congestive) and diastolic (congestive) heart failure: Secondary | ICD-10-CM | POA: Diagnosis present

## 2023-05-21 DIAGNOSIS — E1122 Type 2 diabetes mellitus with diabetic chronic kidney disease: Secondary | ICD-10-CM | POA: Diagnosis not present

## 2023-05-21 DIAGNOSIS — Z87891 Personal history of nicotine dependence: Secondary | ICD-10-CM

## 2023-05-21 DIAGNOSIS — J449 Chronic obstructive pulmonary disease, unspecified: Secondary | ICD-10-CM | POA: Diagnosis not present

## 2023-05-21 DIAGNOSIS — Z7984 Long term (current) use of oral hypoglycemic drugs: Secondary | ICD-10-CM

## 2023-05-21 DIAGNOSIS — I4891 Unspecified atrial fibrillation: Secondary | ICD-10-CM | POA: Diagnosis not present

## 2023-05-21 DIAGNOSIS — Z8249 Family history of ischemic heart disease and other diseases of the circulatory system: Secondary | ICD-10-CM

## 2023-05-21 DIAGNOSIS — I10 Essential (primary) hypertension: Secondary | ICD-10-CM | POA: Diagnosis not present

## 2023-05-21 DIAGNOSIS — R0602 Shortness of breath: Secondary | ICD-10-CM | POA: Diagnosis not present

## 2023-05-21 DIAGNOSIS — F0390 Unspecified dementia without behavioral disturbance: Secondary | ICD-10-CM

## 2023-05-21 DIAGNOSIS — Z1152 Encounter for screening for COVID-19: Secondary | ICD-10-CM | POA: Diagnosis not present

## 2023-05-21 DIAGNOSIS — J441 Chronic obstructive pulmonary disease with (acute) exacerbation: Secondary | ICD-10-CM | POA: Diagnosis not present

## 2023-05-21 DIAGNOSIS — I5042 Chronic combined systolic (congestive) and diastolic (congestive) heart failure: Secondary | ICD-10-CM | POA: Diagnosis present

## 2023-05-21 DIAGNOSIS — J8410 Pulmonary fibrosis, unspecified: Secondary | ICD-10-CM | POA: Diagnosis not present

## 2023-05-21 DIAGNOSIS — I517 Cardiomegaly: Secondary | ICD-10-CM | POA: Diagnosis not present

## 2023-05-21 DIAGNOSIS — N184 Chronic kidney disease, stage 4 (severe): Secondary | ICD-10-CM | POA: Diagnosis not present

## 2023-05-21 DIAGNOSIS — I130 Hypertensive heart and chronic kidney disease with heart failure and stage 1 through stage 4 chronic kidney disease, or unspecified chronic kidney disease: Secondary | ICD-10-CM | POA: Diagnosis not present

## 2023-05-21 DIAGNOSIS — L89151 Pressure ulcer of sacral region, stage 1: Secondary | ICD-10-CM | POA: Diagnosis not present

## 2023-05-21 DIAGNOSIS — Z7901 Long term (current) use of anticoagulants: Secondary | ICD-10-CM

## 2023-05-21 DIAGNOSIS — E119 Type 2 diabetes mellitus without complications: Secondary | ICD-10-CM

## 2023-05-21 DIAGNOSIS — E785 Hyperlipidemia, unspecified: Secondary | ICD-10-CM | POA: Diagnosis present

## 2023-05-21 DIAGNOSIS — Z79899 Other long term (current) drug therapy: Secondary | ICD-10-CM

## 2023-05-21 DIAGNOSIS — K5900 Constipation, unspecified: Secondary | ICD-10-CM | POA: Diagnosis present

## 2023-05-21 DIAGNOSIS — Z66 Do not resuscitate: Secondary | ICD-10-CM | POA: Diagnosis not present

## 2023-05-21 DIAGNOSIS — Z96651 Presence of right artificial knee joint: Secondary | ICD-10-CM | POA: Diagnosis present

## 2023-05-21 DIAGNOSIS — Z794 Long term (current) use of insulin: Secondary | ICD-10-CM

## 2023-05-21 DIAGNOSIS — Z803 Family history of malignant neoplasm of breast: Secondary | ICD-10-CM

## 2023-05-21 DIAGNOSIS — K219 Gastro-esophageal reflux disease without esophagitis: Secondary | ICD-10-CM | POA: Diagnosis present

## 2023-05-21 DIAGNOSIS — I959 Hypotension, unspecified: Secondary | ICD-10-CM | POA: Diagnosis not present

## 2023-05-21 DIAGNOSIS — L8990 Pressure ulcer of unspecified site, unspecified stage: Secondary | ICD-10-CM | POA: Insufficient documentation

## 2023-05-21 LAB — COMPREHENSIVE METABOLIC PANEL
ALT: 10 U/L (ref 0–44)
AST: 19 U/L (ref 15–41)
Albumin: 3.1 g/dL — ABNORMAL LOW (ref 3.5–5.0)
Alkaline Phosphatase: 77 U/L (ref 38–126)
Anion gap: 13 (ref 5–15)
BUN: 34 mg/dL — ABNORMAL HIGH (ref 8–23)
CO2: 24 mmol/L (ref 22–32)
Calcium: 8.5 mg/dL — ABNORMAL LOW (ref 8.9–10.3)
Chloride: 101 mmol/L (ref 98–111)
Creatinine, Ser: 2.43 mg/dL — ABNORMAL HIGH (ref 0.44–1.00)
GFR, Estimated: 19 mL/min — ABNORMAL LOW (ref >60)
Glucose, Bld: 154 mg/dL — ABNORMAL HIGH (ref 70–99)
Potassium: 3.1 mmol/L — ABNORMAL LOW (ref 3.5–5.1)
Sodium: 138 mmol/L (ref 135–145)
Total Bilirubin: 1 mg/dL (ref 0.3–1.2)
Total Protein: 7.4 g/dL (ref 6.5–8.1)

## 2023-05-21 LAB — CBC
HCT: 35.3 % — ABNORMAL LOW (ref 36.0–46.0)
Hemoglobin: 11.3 g/dL — ABNORMAL LOW (ref 12.0–15.0)
MCH: 30.1 pg (ref 26.0–34.0)
MCHC: 32 g/dL (ref 30.0–36.0)
MCV: 93.9 fL (ref 80.0–100.0)
Platelets: 246 10*3/uL (ref 150–400)
RBC: 3.76 MIL/uL — ABNORMAL LOW (ref 3.87–5.11)
RDW: 15 % (ref 11.5–15.5)
WBC: 10.4 10*3/uL (ref 4.0–10.5)
nRBC: 0 % (ref 0.0–0.2)

## 2023-05-21 LAB — RESP PANEL BY RT-PCR (RSV, FLU A&B, COVID)  RVPGX2
Influenza A by PCR: NEGATIVE
Influenza B by PCR: NEGATIVE
Resp Syncytial Virus by PCR: NEGATIVE
SARS Coronavirus 2 by RT PCR: NEGATIVE

## 2023-05-21 LAB — PROCALCITONIN: Procalcitonin: <0.1 ng/mL

## 2023-05-21 LAB — PROTIME-INR
INR: 2.4 — ABNORMAL HIGH (ref 0.8–1.2)
Prothrombin Time: 26.6 seconds — ABNORMAL HIGH (ref 11.4–15.2)

## 2023-05-21 LAB — APTT: aPTT: 51 seconds — ABNORMAL HIGH (ref 24–36)

## 2023-05-21 LAB — BRAIN NATRIURETIC PEPTIDE: B Natriuretic Peptide: 260.5 pg/mL — ABNORMAL HIGH (ref 0.0–100.0)

## 2023-05-21 LAB — GLUCOSE, CAPILLARY
Glucose-Capillary: 469 mg/dL — ABNORMAL HIGH (ref 70–99)
Glucose-Capillary: 485 mg/dL — ABNORMAL HIGH (ref 70–99)
Glucose-Capillary: 406 mg/dL — ABNORMAL HIGH (ref 70–99)

## 2023-05-21 LAB — GLUCOSE, RANDOM: Glucose, Bld: 480 mg/dL — ABNORMAL HIGH (ref 70–99)

## 2023-05-21 MED ORDER — ATORVASTATIN CALCIUM 20 MG PO TABS
10.0000 mg | ORAL_TABLET | Freq: Every evening | ORAL | Status: DC
  Administered 2023-05-21 – 2023-05-22 (×2): 10 mg via ORAL
  Filled 2023-05-21 (×2): qty 1

## 2023-05-21 MED ORDER — ROPINIROLE HCL 0.25 MG PO TABS
0.5000 mg | ORAL_TABLET | Freq: Every evening | ORAL | Status: DC
  Administered 2023-05-21 – 2023-05-22 (×2): 0.5 mg via ORAL
  Filled 2023-05-21 (×2): qty 2

## 2023-05-21 MED ORDER — INSULIN ASPART 100 UNIT/ML IJ SOLN
0.0000 [IU] | Freq: Three times a day (TID) | INTRAMUSCULAR | Status: DC
  Administered 2023-05-22: 5 [IU] via SUBCUTANEOUS
  Administered 2023-05-22 – 2023-05-23 (×3): 3 [IU] via SUBCUTANEOUS
  Administered 2023-05-23: 5 [IU] via SUBCUTANEOUS
  Filled 2023-05-21 (×5): qty 1

## 2023-05-21 MED ORDER — PREDNISONE 20 MG PO TABS
40.0000 mg | ORAL_TABLET | Freq: Every day | ORAL | Status: DC
  Administered 2023-05-22 – 2023-05-23 (×2): 40 mg via ORAL
  Filled 2023-05-21 (×2): qty 2

## 2023-05-21 MED ORDER — FERROUS SULFATE 325 (65 FE) MG PO TABS
325.0000 mg | ORAL_TABLET | Freq: Every evening | ORAL | Status: DC
  Administered 2023-05-21 – 2023-05-22 (×2): 325 mg via ORAL
  Filled 2023-05-21 (×2): qty 1

## 2023-05-21 MED ORDER — IPRATROPIUM-ALBUTEROL 0.5-2.5 (3) MG/3ML IN SOLN
3.0000 mL | Freq: Once | RESPIRATORY_TRACT | Status: AC
  Administered 2023-05-21: 3 mL via RESPIRATORY_TRACT
  Filled 2023-05-21: qty 3

## 2023-05-21 MED ORDER — IPRATROPIUM-ALBUTEROL 0.5-2.5 (3) MG/3ML IN SOLN
3.0000 mL | Freq: Three times a day (TID) | RESPIRATORY_TRACT | Status: DC
  Administered 2023-05-22: 3 mL via RESPIRATORY_TRACT
  Filled 2023-05-21: qty 3

## 2023-05-21 MED ORDER — CITALOPRAM HYDROBROMIDE 20 MG PO TABS
20.0000 mg | ORAL_TABLET | Freq: Every day | ORAL | Status: DC
  Administered 2023-05-22 – 2023-05-23 (×2): 20 mg via ORAL
  Filled 2023-05-21 (×2): qty 1

## 2023-05-21 MED ORDER — IPRATROPIUM-ALBUTEROL 0.5-2.5 (3) MG/3ML IN SOLN
3.0000 mL | Freq: Four times a day (QID) | RESPIRATORY_TRACT | Status: DC
  Administered 2023-05-21 (×2): 3 mL via RESPIRATORY_TRACT
  Filled 2023-05-21: qty 3

## 2023-05-21 MED ORDER — SODIUM CHLORIDE 0.9% FLUSH
3.0000 mL | Freq: Two times a day (BID) | INTRAVENOUS | Status: DC
  Administered 2023-05-21 – 2023-05-23 (×4): 3 mL via INTRAVENOUS

## 2023-05-21 MED ORDER — VITAMIN B-12 1000 MCG PO TABS
1000.0000 ug | ORAL_TABLET | Freq: Every day | ORAL | Status: DC
  Administered 2023-05-22 – 2023-05-23 (×2): 1000 ug via ORAL
  Filled 2023-05-21 (×2): qty 1

## 2023-05-21 MED ORDER — SODIUM CHLORIDE 0.9 % IV SOLN
1.0000 g | INTRAVENOUS | Status: DC
  Administered 2023-05-21: 1 g via INTRAVENOUS
  Filled 2023-05-21: qty 10

## 2023-05-21 MED ORDER — PANTOPRAZOLE SODIUM 40 MG PO TBEC
40.0000 mg | DELAYED_RELEASE_TABLET | Freq: Two times a day (BID) | ORAL | Status: DC
  Administered 2023-05-21 – 2023-05-23 (×4): 40 mg via ORAL
  Filled 2023-05-21 (×4): qty 1

## 2023-05-21 MED ORDER — METOPROLOL TARTRATE 25 MG PO TABS
12.5000 mg | ORAL_TABLET | Freq: Two times a day (BID) | ORAL | Status: DC
  Administered 2023-05-21 – 2023-05-23 (×5): 12.5 mg via ORAL
  Filled 2023-05-21 (×5): qty 1

## 2023-05-21 MED ORDER — TORSEMIDE 20 MG PO TABS
50.0000 mg | ORAL_TABLET | Freq: Every day | ORAL | Status: DC
  Administered 2023-05-22 – 2023-05-23 (×2): 50 mg via ORAL
  Filled 2023-05-21 (×2): qty 3

## 2023-05-21 MED ORDER — LINACLOTIDE 145 MCG PO CAPS
145.0000 ug | ORAL_CAPSULE | Freq: Every day | ORAL | Status: DC
  Administered 2023-05-21 – 2023-05-22 (×2): 145 ug via ORAL
  Filled 2023-05-21 (×2): qty 1

## 2023-05-21 MED ORDER — AMLODIPINE BESYLATE 5 MG PO TABS
5.0000 mg | ORAL_TABLET | Freq: Every day | ORAL | Status: DC
  Administered 2023-05-22 – 2023-05-23 (×2): 5 mg via ORAL
  Filled 2023-05-21 (×2): qty 1

## 2023-05-21 MED ORDER — DONEPEZIL HCL 5 MG PO TABS
5.0000 mg | ORAL_TABLET | Freq: Every morning | ORAL | Status: DC
  Administered 2023-05-22 – 2023-05-23 (×2): 5 mg via ORAL
  Filled 2023-05-21 (×2): qty 1

## 2023-05-21 MED ORDER — INSULIN ASPART 100 UNIT/ML IJ SOLN
20.0000 [IU] | Freq: Once | INTRAMUSCULAR | Status: AC
  Administered 2023-05-21: 20 [IU] via SUBCUTANEOUS
  Filled 2023-05-21: qty 1

## 2023-05-21 MED ORDER — SODIUM CHLORIDE 0.9 % IV SOLN
500.0000 mg | INTRAVENOUS | Status: DC
  Administered 2023-05-21: 500 mg via INTRAVENOUS
  Filled 2023-05-21: qty 5

## 2023-05-21 MED ORDER — ACETAMINOPHEN 325 MG PO TABS
650.0000 mg | ORAL_TABLET | Freq: Four times a day (QID) | ORAL | Status: DC | PRN
  Filled 2023-05-21: qty 2

## 2023-05-21 MED ORDER — CYCLOBENZAPRINE HCL 10 MG PO TABS: 5.0000 mg | ORAL_TABLET | Freq: Every day | ORAL | Status: DC | PRN

## 2023-05-21 MED ORDER — POTASSIUM CHLORIDE 20 MEQ PO PACK
40.0000 meq | PACK | Freq: Two times a day (BID) | ORAL | Status: DC
  Administered 2023-05-21 (×2): 40 meq via ORAL
  Filled 2023-05-21 (×2): qty 2

## 2023-05-21 MED ORDER — MONTELUKAST SODIUM 10 MG PO TABS
10.0000 mg | ORAL_TABLET | Freq: Every day | ORAL | Status: DC
  Administered 2023-05-21 – 2023-05-22 (×2): 10 mg via ORAL
  Filled 2023-05-21 (×2): qty 1

## 2023-05-21 MED ORDER — METHYLPREDNISOLONE SODIUM SUCC 125 MG IJ SOLR
125.0000 mg | Freq: Once | INTRAMUSCULAR | Status: AC
  Administered 2023-05-21: 125 mg via INTRAVENOUS
  Filled 2023-05-21: qty 2

## 2023-05-21 MED ORDER — INSULIN GLARGINE-YFGN 100 UNIT/ML ~~LOC~~ SOLN
20.0000 [IU] | Freq: Every day | SUBCUTANEOUS | Status: DC
  Administered 2023-05-21 – 2023-05-22 (×2): 20 [IU] via SUBCUTANEOUS
  Filled 2023-05-21 (×3): qty 0.2

## 2023-05-21 MED ORDER — FUROSEMIDE 10 MG/ML IJ SOLN
40.0000 mg | Freq: Once | INTRAMUSCULAR | Status: AC
  Administered 2023-05-21: 40 mg via INTRAVENOUS
  Filled 2023-05-21: qty 4

## 2023-05-21 NOTE — Assessment & Plan Note (Addendum)
Telemetry monitoring with intermittent RVR with rates as high as 114, but generally below 100.  Patient was previously on metoprolol 25 mg daily for rate control, however was discontinued approximately a year and a half ago after patient was bradycardic.  Will restart at low-dose.  - Telemetry monitoring - Metoprolol 12.5 mg twice daily - Continue Coumadin per pharmacy dosing

## 2023-05-21 NOTE — H&P (Signed)
History and Physical    Patient: [name] WUJ:[id number] DOB: 12-21-1940 DOA: 05/21/2023 DOS: the patient was seen and examined on 05/21/2023 PCP: Sallyanne Kuster, NP  Patient coming from: Home  Chief Complaint:  Chief Complaint  Patient presents with   Shortness of Breath   HPI: [name] is a 82 y.o. female with medical history significant of chronic combined systolic and diastolic heart failure, COPD, pulmonary fibrosis, type 2 diabetes, CKD stage IV, atrial fibrillation on Coumadin, hypertension, hyperlipidemia, GERD who presents to the ED due to shortness of breath.  [name] states she has been experiencing gradually worsening productive cough and shortness of breath over the last 1 week.  She feels that she has volume on her but has been taking her home diuretic daily as instructed.  She denies any fever, chills, nausea, vomiting, diarrhea, abdominal pain.  She denies noticing any lower extremity swelling or abdominal bloating.  She denies any chest pain or palpitations.  ED course: On arrival to the ED, patient's blood pressure was 114/69 with heart rate of 98.  She was saturating at 99% on room air.  Respiratory rate of 18/min.  She was afebrile at 98.1. Initial workup notable for WBC of 10.4, hemoglobin 11.3, potassium 3.1, BUN 34, creatinine 2.43 with GFR 19, and BNP of 260.  Chest x-ray was obtained with no acute cardiopulmonary process, unchanged moderate chronic bilateral interstitial thickening.  Patient was started on Solu-Medrol, DuoNebs, Lasix and potassium.  TRH contacted for admission.  Review of Systems: As mentioned in the history of present illness. All other systems reviewed and are negative.  Past Medical History:  Diagnosis Date   Anemia    Atrial fibrillation (HCC)    Chronic kidney disease    Congestive heart failure (CHF) (HCC)    COPD (chronic obstructive pulmonary disease) (HCC)    Diabetes mellitus without complication (HCC)    GERD  (gastroesophageal reflux disease)    Hyperlipidemia    Hypertension    Pneumonia    Pulmonary fibrosis (HCC)    Past Surgical History:  Procedure Laterality Date   ABDOMINAL HYSTERECTOMY     APPENDECTOMY     CATARACT EXTRACTION     CHOLECYSTECTOMY     HERNIA REPAIR     right knee replacement     right nephroectomy     Social History:  reports that she quit smoking about 9 months ago. Her smoking use included cigarettes. She has never used smokeless tobacco. She reports that she does not drink alcohol and does not use drugs.  No Known Allergies  Family History  Problem Relation Age of Onset   Diabetes Mother    Breast cancer Mother    Heart disease Father    Diabetes Father    Diabetes Sister    Diabetes Brother    Cancer Brother     Prior to Admission medications   Medication Sig Start Date End Date Taking? Authorizing Provider  amLODipine (NORVASC) 5 MG tablet Take 1 tablet (5 mg total) by mouth daily. 04/09/23   Sallyanne Kuster, NP  atorvastatin (LIPITOR) 10 MG tablet Take 1 tablet (10 mg total) by mouth every evening. 04/09/23   Sallyanne Kuster, NP  citalopram (CELEXA) 20 MG tablet Take 1 tablet (20 mg total) by mouth daily. 04/09/23   Sallyanne Kuster, NP  cyclobenzaprine (FLEXERIL) 5 MG tablet TAKE 1 TABLET BY MOUTH AT BEDTIME 03/13/23   Abernathy, Arlyss Repress, NP  donepezil (ARICEPT) 5 MG tablet Take 1 tablet (5  mg total) by mouth every morning. 04/09/23   Sallyanne Kuster, NP  ferrous sulfate 325 (65 FE) MG tablet Take 1 tablet (325 mg total) by mouth daily with breakfast. 12/30/20   Gillis Santa, MD  insulin glargine (LANTUS) 100 UNIT/ML Solostar Pen Inject 24 Units into the skin daily. 05/15/23   Sallyanne Kuster, NP  Insulin Pen Needle (PEN NEEDLES 3/16") 31G X 5 MM MISC 1 applicator by Does not apply route 4 (four) times daily. 04/09/23   Sallyanne Kuster, NP  linagliptin (TRADJENTA) 5 MG TABS tablet Take 1 tablet (5 mg total) by mouth daily. 04/09/23   Sallyanne Kuster, NP  LINZESS 145 MCG CAPS capsule Take 1 capsule by mouth at bedtime 04/30/23   Sallyanne Kuster, NP  melatonin 3 MG TABS tablet Take 3 mg by mouth at bedtime as needed (sleep).    [provider]  methocarbamol (ROBAXIN) 500 MG tablet Take 1 tablet (500 mg total) by mouth in the morning and at bedtime. 11/02/22   Sallyanne Kuster, NP  montelukast (SINGULAIR) 10 MG tablet TAKE 1 TABLET BY MOUTH AT BEDTIME 10/13/22   Sallyanne Kuster, NP  mupirocin ointment (BACTROBAN) 2 % Apply 1 Application topically daily. To wound until healed. 03/20/23   Sallyanne Kuster, NP  ondansetron (ZOFRAN-ODT) 4 MG disintegrating tablet Take 1 tablet (4 mg total) by mouth every 6 (six) hours as needed for nausea or vomiting. 11/02/22   Sallyanne Kuster, NP  oxyCODONE-acetaminophen (PERCOCET/ROXICET) 5-325 MG tablet Take 1 tablet by mouth 2 (two) times daily as needed for severe pain. 04/09/23   Sallyanne Kuster, NP  oxyCODONE-acetaminophen (PERCOCET/ROXICET) 5-325 MG tablet Take 1 tablet by mouth 2 (two) times daily as needed for severe pain. 04/09/23   Sallyanne Kuster, NP  pantoprazole (PROTONIX) 40 MG tablet Take 1 tablet (40 mg total) by mouth 2 (two) times daily. 04/09/23   Sallyanne Kuster, NP  rOPINIRole (REQUIP) 0.5 MG tablet Take 1 tablet (0.5 mg total) by mouth every evening. 04/09/23   Sallyanne Kuster, NP  torsemide (DEMADEX) 20 MG tablet Take 2.5 tablets (50 mg total) by mouth daily. 01/22/23   Sallyanne Kuster, NP  traMADol (ULTRAM) 50 MG tablet Take one tab po bid prn for pain 04/09/23   Sallyanne Kuster, NP  vitamin B-12 1000 MCG tablet Take 1 tablet (1,000 mcg total) by mouth daily. 12/30/20   Gillis Santa, MD  warfarin (COUMADIN) 2 MG tablet Take 1 tab po Tuesday ,Thursday ,Friday ad Saturday and Sunday and then take 4 mg rest of week 01/16/23 02/16/23  Sallyanne Kuster, NP  warfarin (COUMADIN) 4 MG tablet TAKE 1 TABLET BY MOUTH ON Duanne [place] AND FRIDAY 12/25/22   Sallyanne Kuster, NP     Physical Exam: Vitals:   05/21/23 0842 05/21/23 1100 05/21/23 1130 05/21/23 1200  BP: 114/69 (!) 98/50 120/62 (!) 122/54  Pulse:  (!) 113 (!) 109 94  Resp: 18 15 15 15   Temp:      TempSrc:      SpO2:  95% 97% 96%  Weight:      Height:       Physical Exam Vitals and nursing note reviewed.  Constitutional:      General: She is not in acute distress.    Appearance: She is obese.  HENT:     Head: Normocephalic and atraumatic.     Mouth/Throat:     Mouth: Mucous membranes are moist.     Pharynx: Oropharynx is clear.  Eyes:     Extraocular Movements:  Extraocular movements intact.     Pupils: Pupils are equal, round, and reactive to light.  Neck:     Vascular: No JVD.  Cardiovascular:     Rate and Rhythm: Tachycardia present. Rhythm irregularly irregular.     Heart sounds: No murmur heard. Pulmonary:     Effort: No tachypnea, accessory muscle usage or respiratory distress.     Breath sounds: Wheezing (Diffuse wheezing throughout) present. No decreased breath sounds, rhonchi or rales.  Abdominal:     Palpations: Abdomen is soft.     Tenderness: There is no abdominal tenderness.  Musculoskeletal:     Right lower leg: No edema.     Left lower leg: No edema.  Skin:    General: Skin is warm and dry.  Neurological:     General: No focal deficit present.     Mental Status: She is alert. Mental status is at baseline.  Psychiatric:        Mood and Affect: Mood normal.        Behavior: Behavior normal.    Data Reviewed: CBC with WBC of 10.4, hemoglobin 11.3, MCV of 93.9, platelets of 246 CMP with sodium of 138, potassium 3.1, bicarb 24, glucose 154, BUN 34, creatinine 2.43, calcium 8.5, albumin 3.1, GFR 19 BNP 260 PTT 51  EKG personally reviewed.  Atrial fibrillation with rate of 112.  No ST or T wave changes consistent with acute ischemia.  DG Chest 2 View  Result Date: 05/21/2023 CLINICAL DATA:  Shortness of breath.  COPD. EXAM: CHEST - 2 VIEW COMPARISON:  Chest  radiographs 10/19/2022 and 06/28/2021; CT chest 03/10/2021 FINDINGS: Cardiac silhouette is again mildly to moderately enlarged. Mediastinal contours are within normal limits with mild-to-moderate atherosclerotic calcifications within the aortic arch. Flattening of the diaphragms and moderate hyperinflation. Unchanged moderate chronic bilateral interstitial thickening, likely interstitial scarring. No definite acute airspace opacity. No pleural effusion or pneumothorax. Moderate multilevel degenerative disc changes of the lower thoracic spine. Diffuse decreased bone mineralization. IMPRESSION: 1. No acute cardiopulmonary process. 2. Unchanged moderate chronic bilateral interstitial thickening, likely interstitial scarring. 3. Unchanged mild-to-moderate cardiomegaly. Electronically Signed   By: Neita Garnet M.D.   On: 05/21/2023 09:52    Results are pending, will review when available.  Assessment and Plan:  * COPD with acute exacerbation (HCC) Patient is presenting with 1 week of increasing shortness of breath and productive cough with diffuse wheezing heard on examination consistent with a COPD exacerbation.  No opacity seen on chest x-ray.  - Continuous pulse oximetry with goal oxygen saturation above 88% - S/p Solu-Medrol 125 mg once - Start prednisone 40 mg tomorrow to complete a 5-day course - Continue Ceftriaxone and azithromycin - RVP and procalcitonin pending - Discontinue antibiotics if procalcitonin is negative - DuoNebs every 6 hours - Continue home bronchodilators  Chronic combined systolic and diastolic heart failure (HCC) There was initial concern for CHF exacerbation in the ED, however there is no evidence of hypervolemia on examination, including no evidence of JVD, peripheral edema or pulmonary edema.  BNP is mildly increased, however compared to previous exacerbations, it is low for the.  She has already received one-time dose of IV Lasix.   - Telemetry monitoring overnight  given patient received IV Lasix - Resume home Lasix tomorrow - Recommend outpatient follow-up with cardiology  Atrial fibrillation with RVR (HCC) Telemetry monitoring with intermittent RVR with rates as high as 114, but generally below 100.  Patient was previously on metoprolol 25 mg daily for rate  control, however was discontinued approximately a year and a half ago after patient was bradycardic.  Will restart at low-dose.  - Telemetry monitoring - Metoprolol 12.5 mg twice daily - Continue Coumadin per pharmacy dosing  Pulmonary fibrosis (HCC) Per previous records, patient has a history of pulmonary fibrosis with evidence of restriction no previous PFTs.  No prior workup for etiology.  - Consider outpatient referral to ILD center  Diabetes [facility]) Previously well-controlled with marked increase in A1c on most recent check up to 13.5%.  - SSI, moderate - Semglee 20 mg at bedtime - Hold home antiglycemic agents  CKD stage 4 due to type 2 diabetes mellitus (HCC) Renal function currently at baseline.  Will recheck tomorrow morning given patient received IV Lasix.  - BMP in the a.m.  Essential hypertension Patient has a history of borderline low blood pressure due to tensive use.  She is no longer on any antihypertensives other than Lasix.  Long term (current) use of anticoagulants - Pharmacy consult for warfarin management  Dementia without behavioral disturbance [facility]) Patient is currently at baseline.  - Continue home regimen  Advance Care Planning:   Code Status: DNR.  Patient states she would not want cardiac resuscitation in the event of cardiac arrest, however in the case of pulmonary arrest only, she would like resuscitation for now.  She is going to have continued discussions with her family regarding this.  Consults: None  Family Communication: Patient's son updated at bedside  Severity of Illness: The appropriate patient status for this patient is OBSERVATION.  Observation status is judged to be reasonable and necessary in order to provide the required intensity of service to ensure the patient's safety. The patient's presenting symptoms, physical exam findings, and initial radiographic and laboratory data in the context of their medical condition is felt to place them at decreased risk for further clinical deterioration. Furthermore, it is anticipated that the patient will be medically stable for discharge from the hospital within 2 midnights of admission.   Author: Verdene Lennert, MD 05/21/2023 1:47 PM  For on call review [email or web address].

## 2023-05-21 NOTE — Assessment & Plan Note (Signed)
Previously well-controlled with marked increase in A1c on most recent check up to 13.5%.  - SSI, moderate - Semglee 20 mg at bedtime - Hold home antiglycemic agents

## 2023-05-21 NOTE — Assessment & Plan Note (Signed)
Patient is currently at baseline.  - Continue home regimen

## 2023-05-21 NOTE — Assessment & Plan Note (Signed)
There was initial concern for CHF exacerbation in the ED, however there is no evidence of hypervolemia on examination, including no evidence of JVD, peripheral edema or pulmonary edema.  BNP is mildly increased, however compared to previous exacerbations, it is low for the.  She has already received one-time dose of IV Lasix.   - Telemetry monitoring overnight given patient received IV Lasix - Resume home Lasix tomorrow - Recommend outpatient follow-up with cardiology

## 2023-05-21 NOTE — Assessment & Plan Note (Signed)
Per previous records, patient has a history of pulmonary fibrosis with evidence of restriction no previous PFTs.  No prior workup for etiology.  - Consider outpatient referral to ILD center

## 2023-05-21 NOTE — ED Notes (Signed)
ED TO INPATIENT HANDOFF REPORT  ED Nurse Name and Phone #: Delice Bison, RN  S Name/Age/Gender [name] 82 y.o. female Room/Bed: ED10A/ED10A  Code Status   Code Status: DNR  Home/SNF/Other Home Patient oriented to: self, place, time, and situation Is this baseline? Yes   Triage Complete: Triage complete  Chief Complaint COPD with acute exacerbation (HCC) [J44.1]  Triage Note Pt here with SOB x1 week. Pt states she has COPD and her symptoms are worsening. Pt denies cp. Pt has CHF.   Allergies No Known Allergies  Level of Care/Admitting Diagnosis ED Disposition     ED Disposition  Admit   Condition  --   Comment  Hospital Area: [facility] [[id number]]  Level of Care: Telemetry Medical [104]  Covid Evaluation: Symptomatic Person Under Investigation (PUI) or recent exposure (last 10 days) *Testing Required*  Diagnosis: COPD with acute exacerbation [facility] [[id number]]  Admitting Physician: Verdene Lennert [[id number]]  Attending Physician: Verdene Lennert [[id number]]          B Medical/Surgery History Past Medical History:  Diagnosis Date   Anemia    Atrial fibrillation (HCC)    Chronic kidney disease    Congestive heart failure (CHF) (HCC)    COPD (chronic obstructive pulmonary disease) (HCC)    Diabetes mellitus without complication (HCC)    GERD (gastroesophageal reflux disease)    Hyperlipidemia    Hypertension    Pneumonia    Pulmonary fibrosis (HCC)    Past Surgical History:  Procedure Laterality Date   ABDOMINAL HYSTERECTOMY     APPENDECTOMY     CATARACT EXTRACTION     CHOLECYSTECTOMY     HERNIA REPAIR     right knee replacement     right nephroectomy       A IV Location/Drains/Wounds Patient Lines/Drains/Airways Status     Active Line/Drains/Airways     Name Placement date Placement time Site Days   Peripheral IV 05/21/23 20 G 1" Left Antecubital 05/21/23  0912  Antecubital  less than 1            Intake/Output Last  24 hours No intake or output data in the 24 hours ending 05/21/23 1549  Labs/Imaging Results for orders placed or performed during the hospital encounter of 05/21/23 (from the past 48 hour(s))  CBC     Status: Abnormal   Collection Time: 05/21/23  8:43 AM  Result Value Ref Range   WBC 10.4 4.0 - 10.5 K/uL   RBC 3.76 (L) 3.87 - 5.11 MIL/uL   Hemoglobin 11.3 (L) 12.0 - 15.0 g/dL   HCT 29.5 (L) 62.1 - 30.8 %   MCV 93.9 80.0 - 100.0 fL   MCH 30.1 26.0 - 34.0 pg   MCHC 32.0 30.0 - 36.0 g/dL   RDW 65.7 84.6 - 96.2 %   Platelets 246 150 - 400 K/uL   nRBC 0.0 0.0 - 0.2 %    Comment: Performed at [facility], [address] [place], [place] [address]  Comprehensive metabolic panel     Status: Abnormal   Collection Time: 05/21/23  8:43 AM  Result Value Ref Range   Sodium 138 135 - 145 mmol/L   Potassium 3.1 (L) 3.5 - 5.1 mmol/L   Chloride 101 98 - 111 mmol/L   CO2 24 22 - 32 mmol/L   Glucose, Bld 154 (H) 70 - 99 mg/dL    Comment: Glucose reference range applies only to samples taken after fasting for at least 8 hours.  BUN 34 (H) 8 - 23 mg/dL   Creatinine, Ser 6.29 (H) 0.44 - 1.00 mg/dL   Calcium 8.5 (L) 8.9 - 10.3 mg/dL   Total Protein 7.4 6.5 - 8.1 g/dL   Albumin 3.1 (L) 3.5 - 5.0 g/dL   AST 19 15 - 41 U/L   ALT 10 0 - 44 U/L   Alkaline Phosphatase 77 38 - 126 U/L   Total Bilirubin 1.0 0.3 - 1.2 mg/dL   GFR, Estimated 19 (L) >60 mL/min    Comment: (NOTE) Calculated using the CKD-EPI Creatinine Equation (2021)    Anion gap 13 5 - 15    Comment: Performed at [facility], [address], [place], [place] [address]  Brain natriuretic peptide     Status: Abnormal   Collection Time: 05/21/23  8:43 AM  Result Value Ref Range   B Natriuretic Peptide 260.5 (H) 0.0 - 100.0 pg/mL    Comment: Performed at [facility], [address] [place], [place] [address]  APTT     Status: Abnormal   Collection Time: 05/21/23  8:43 AM  Result Value Ref Range    aPTT 51 (H) 24 - 36 seconds    Comment:        IF BASELINE aPTT IS ELEVATED, SUGGEST PATIENT RISK ASSESSMENT BE USED TO DETERMINE APPROPRIATE ANTICOAGULANT THERAPY. Performed at [facility], [address] [place], [place] [address]   Resp panel by RT-PCR (RSV, Flu A&B, Covid) Anterior Nasal Swab     Status: None   Collection Time: 05/21/23  2:07 PM   Specimen: Anterior Nasal Swab  Result Value Ref Range   SARS Coronavirus 2 by RT PCR NEGATIVE NEGATIVE    Comment: (NOTE) SARS-CoV-2 target nucleic acids are NOT DETECTED.  The SARS-CoV-2 RNA is generally detectable in upper respiratory specimens during the acute phase of infection. The lowest concentration of SARS-CoV-2 viral copies this assay can detect is 138 copies/mL. A negative result does not preclude SARS-Cov-2 infection and should not be used as the sole basis for treatment or other patient management decisions. A negative result may occur with  improper specimen collection/handling, submission of specimen other than nasopharyngeal swab, presence of viral mutation(s) within the areas targeted by this assay, and inadequate number of viral copies(<138 copies/mL). A negative result must be combined with clinical observations, patient history, and epidemiological information. The expected result is Negative.  Fact Sheet for Patients:  BloggerCourse.com  Fact Sheet for Healthcare Providers:  SeriousBroker.it  This test is no t yet approved or cleared by the [place] FDA and  has been authorized for detection and/or diagnosis of SARS-CoV-2 by FDA under an Emergency Use Authorization (EUA). This EUA will remain  in effect (meaning this test can be used) for the duration of the COVID-19 declaration under Section 564(b)(1) of the Act, 21 U.S.C.section 360bbb-3(b)(1), unless the authorization is terminated  or revoked sooner.       Influenza A by PCR NEGATIVE  NEGATIVE   Influenza B by PCR NEGATIVE NEGATIVE    Comment: (NOTE) The Xpert Xpress SARS-CoV-2/FLU/RSV plus assay is intended as an aid in the diagnosis of influenza from Nasopharyngeal swab specimens and should not be used as a sole basis for treatment. Nasal washings and aspirates are unacceptable for Xpert Xpress SARS-CoV-2/FLU/RSV testing.  Fact Sheet for Patients: BloggerCourse.com  Fact Sheet for Healthcare Providers: SeriousBroker.it  This test is not yet approved or cleared by the [place] FDA and has been authorized for detection  and/or diagnosis of SARS-CoV-2 by FDA under an Emergency Use Authorization (EUA). This EUA will remain in effect (meaning this test can be used) for the duration of the COVID-19 declaration under Section 564(b)(1) of the Act, 21 U.S.C. section 360bbb-3(b)(1), unless the authorization is terminated or revoked.     Resp Syncytial Virus by PCR NEGATIVE NEGATIVE    Comment: (NOTE) Fact Sheet for Patients: BloggerCourse.com  Fact Sheet for Healthcare Providers: SeriousBroker.it  This test is not yet approved or cleared by the [place] FDA and has been authorized for detection and/or diagnosis of SARS-CoV-2 by FDA under an Emergency Use Authorization (EUA). This EUA will remain in effect (meaning this test can be used) for the duration of the COVID-19 declaration under Section 564(b)(1) of the Act, 21 U.S.C. section 360bbb-3(b)(1), unless the authorization is terminated or revoked.  Performed at [facility], [address] [place], [place] [address]    DG Chest 2 View  Result Date: 05/21/2023 CLINICAL DATA:  Shortness of breath.  COPD. EXAM: CHEST - 2 VIEW COMPARISON:  Chest radiographs 10/19/2022 and 06/28/2021; CT chest 03/10/2021 FINDINGS: Cardiac silhouette is again mildly to moderately enlarged. Mediastinal contours are  within normal limits with mild-to-moderate atherosclerotic calcifications within the aortic arch. Flattening of the diaphragms and moderate hyperinflation. Unchanged moderate chronic bilateral interstitial thickening, likely interstitial scarring. No definite acute airspace opacity. No pleural effusion or pneumothorax. Moderate multilevel degenerative disc changes of the lower thoracic spine. Diffuse decreased bone mineralization. IMPRESSION: 1. No acute cardiopulmonary process. 2. Unchanged moderate chronic bilateral interstitial thickening, likely interstitial scarring. 3. Unchanged mild-to-moderate cardiomegaly. Electronically Signed   By: Neita Garnet M.D.   On: 05/21/2023 09:52    Pending Labs Unresulted Labs (From admission, onward)     Start     Ordered   05/22/23 0500  HIV Antibody (routine testing w rflx)  (HIV Antibody (Routine testing w reflex) panel)  Tomorrow morning,   URGENT        05/21/23 1336   05/21/23 1337  Procalcitonin  Add-on,   AD       References:    Procalcitonin Lower Respiratory Tract Infection AND Sepsis Procalcitonin Algorithm   05/21/23 1336   05/21/23 1336  Protime-INR  Add-on,   AD        05/21/23 1336            Vitals/Pain Today's Vitals   05/21/23 1404 05/21/23 1430 05/21/23 1439 05/21/23 1500  BP: 128/66 (!) 143/79  128/65  Pulse: 98 (!) 125  (!) 110  Resp: 20   (!) 22  Temp:   97.7 F (36.5 C)   TempSrc:   Oral   SpO2:  94%  97%  Weight:      Height:      PainSc:        Isolation Precautions No active isolations  Medications Medications  potassium chloride (KLOR-CON) packet 40 mEq (40 mEq Oral Given 05/21/23 1016)  sodium chloride flush (NS) 0.9 % injection 3 mL (3 mLs Intravenous Not Given 05/21/23 1412)  cefTRIAXone (ROCEPHIN) 1 g in sodium chloride 0.9 % 100 mL IVPB (0 g Intravenous Stopped 05/21/23 1439)  azithromycin (ZITHROMAX) 500 mg in sodium chloride 0.9 % 250 mL IVPB (500 mg Intravenous New Bag/Given 05/21/23 1446)  predniSONE  (DELTASONE) tablet 40 mg (has no administration in time range)  ipratropium-albuterol (DUONEB) 0.5-2.5 (3) MG/3ML nebulizer solution 3 mL (3 mLs Nebulization Given 05/21/23 1405)  metoprolol tartrate (LOPRESSOR) tablet 12.5 mg (12.5 mg  Oral Given 05/21/23 1404)  ipratropium-albuterol (DUONEB) 0.5-2.5 (3) MG/3ML nebulizer solution 3 mL (3 mLs Nebulization Given 05/21/23 0930)  furosemide (LASIX) injection 40 mg (40 mg Intravenous Given 05/21/23 1016)  methylPREDNISolone sodium succinate (SOLU-MEDROL) 125 mg/2 mL injection 125 mg (125 mg Intravenous Given 05/21/23 1129)    Mobility walks     Focused Assessments Pulmonary Assessment Handoff:  Lung sounds:   O2 Device: Room Air      R Recommendations: See Admitting Provider Note  Report given to:   Additional Notes:

## 2023-05-21 NOTE — Assessment & Plan Note (Signed)
Patient is presenting with 1 week of increasing shortness of breath and productive cough with diffuse wheezing heard on examination consistent with a COPD exacerbation.  No opacity seen on chest x-ray.  - Continuous pulse oximetry with goal oxygen saturation above 88% - S/p Solu-Medrol 125 mg once - Start prednisone 40 mg tomorrow to complete a 5-day course - Continue Ceftriaxone and azithromycin - RVP and procalcitonin pending - Discontinue antibiotics if procalcitonin is negative - DuoNebs every 6 hours - Continue home bronchodilators

## 2023-05-21 NOTE — Assessment & Plan Note (Signed)
Patient has a history of borderline low blood pressure due to tensive use.  She is no longer on any antihypertensives other than Lasix.

## 2023-05-21 NOTE — ED Triage Notes (Signed)
Pt here with SOB x1 week. Pt states she has COPD and her symptoms are worsening. Pt denies cp. Pt has CHF.

## 2023-05-21 NOTE — ED Provider Notes (Signed)
[facility] Provider Note    Event Date/Time   First MD Initiated Contact with Patient 05/21/23 [id number]     (approximate)   History   Shortness of Breath   HPI  [name] is a 82 y.o. female who presents to the emergency department today because of concern for shortness of breath.  It started about a week ago.  Has been gradually getting worse.  Has been accompanied by a productive cough.  Patient denies any significant chest pain.  States she has a history of COPD and CHF.  She is on fluid pills and has not noticed any change in her urine output.  Denies having any breathing treatments for COPD.  No fevers.     Physical Exam   Triage Vital Signs: ED Triage Vitals  Encounter Vitals Group     BP 05/21/23 0842 114/69     Systolic BP Percentile --      Diastolic BP Percentile --      Pulse Rate 05/21/23 0841 98     Resp 05/21/23 0842 18     Temp 05/21/23 0841 98.1 F (36.7 C)     Temp Source 05/21/23 0841 Oral     SpO2 05/21/23 0841 99 %     Weight 05/21/23 0841 157 lb 13.6 oz (71.6 kg)     Height 05/21/23 0841 4\' 5"  (1.346 m)     Head Circumference --      Peak Flow --      Pain Score 05/21/23 0841 0     Pain Loc --      Pain Education --      Exclude from Growth Chart --     Most recent vital signs: Vitals:   05/21/23 0841 05/21/23 0842  BP:  114/69  Pulse: 98   Resp:  18  Temp: 98.1 F (36.7 C)   SpO2: 99%    General: Awake, alert, oriented. CV:  Good peripheral perfusion. Irregular rhythm. Tachycardia. Resp:  Normal effort. Diffuse expiratory wheezing with crackles in the bases. Abd:  No distention. Non tender. Other:  Trace edema in lower extremities    ED Results / Procedures / Treatments   Labs (all labs ordered are listed, but only abnormal results are displayed) Labs Reviewed  CBC - Abnormal; Notable for the following components:      Result Value   RBC 3.76 (*)    Hemoglobin 11.3 (*)    HCT 35.3 (*)    All other  components within normal limits  COMPREHENSIVE METABOLIC PANEL  BRAIN NATRIURETIC PEPTIDE  APTT     EKG  I, Phineas Semen, attending physician, personally viewed and interpreted this EKG  EKG Time: 0844 Rate: 112 Rhythm: atrial fibrillation with rvr Axis: normal Intervals: qtc 387 QRS: narrow, q waves v1 ST changes: no st elevation Impression: abnormal ekg    RADIOLOGY I independently interpreted and visualized the CXR. My interpretation: pulmonary edema Radiology interpretation:  IMPRESSION:  1. No acute cardiopulmonary process.  2. Unchanged moderate chronic bilateral interstitial thickening,  likely interstitial scarring.  3. Unchanged mild-to-moderate cardiomegaly.     PROCEDURES:  Critical Care performed: No    MEDICATIONS ORDERED IN ED: Medications - No data to display   IMPRESSION / MDM / ASSESSMENT AND PLAN / ED COURSE  I reviewed the triage vital signs and the nursing notes.  Differential diagnosis includes, but is not limited to, pneumonia, ACS, COPD, CHF  Patient's presentation is most consistent with acute presentation with potential threat to life or bodily function.   The patient is on the cardiac monitor to evaluate for evidence of arrhythmia and/or significant heart rate changes.  Patient presented to the emergency department today because of concerns for shortness of breath.  On exam patient does have wheezing and some crackles in the lower lungs.  Patient with history of COPD and CHF.  Will obtain blood work and chest x-ray.  Will give DuoNeb treatments.  Blood work with slightly elevated BNP.  Chest x-ray without concerning findings for pneumonia.  Patient was also given Solu-Medrol and DuoNeb here.  Discussed with Dr. Huel Cote with the hospitalist service who will evaluate for admission.      FINAL CLINICAL IMPRESSION(S) / ED DIAGNOSES   Final diagnoses:  Shortness of breath    Note:  This document  was prepared using Dragon voice recognition software and may include unintentional dictation errors.    Phineas Semen, MD 05/21/23 [phone]

## 2023-05-21 NOTE — Assessment & Plan Note (Signed)
Renal function currently at baseline.  Will recheck tomorrow morning given patient received IV Lasix.  - BMP in the a.m.

## 2023-05-21 NOTE — Assessment & Plan Note (Signed)
-   Pharmacy consult for warfarin management

## 2023-05-22 ENCOUNTER — Ambulatory Visit: Payer: Medicare HMO | Admitting: Physician Assistant

## 2023-05-22 ENCOUNTER — Other Ambulatory Visit: Payer: Self-pay

## 2023-05-22 DIAGNOSIS — J441 Chronic obstructive pulmonary disease with (acute) exacerbation: Secondary | ICD-10-CM | POA: Diagnosis present

## 2023-05-22 DIAGNOSIS — K5900 Constipation, unspecified: Secondary | ICD-10-CM | POA: Diagnosis present

## 2023-05-22 DIAGNOSIS — Z1152 Encounter for screening for COVID-19: Secondary | ICD-10-CM | POA: Diagnosis not present

## 2023-05-22 DIAGNOSIS — Z90710 Acquired absence of both cervix and uterus: Secondary | ICD-10-CM | POA: Diagnosis not present

## 2023-05-22 DIAGNOSIS — Z794 Long term (current) use of insulin: Secondary | ICD-10-CM | POA: Diagnosis not present

## 2023-05-22 DIAGNOSIS — Z66 Do not resuscitate: Secondary | ICD-10-CM | POA: Diagnosis present

## 2023-05-22 DIAGNOSIS — Z7984 Long term (current) use of oral hypoglycemic drugs: Secondary | ICD-10-CM | POA: Diagnosis not present

## 2023-05-22 DIAGNOSIS — I130 Hypertensive heart and chronic kidney disease with heart failure and stage 1 through stage 4 chronic kidney disease, or unspecified chronic kidney disease: Secondary | ICD-10-CM | POA: Diagnosis present

## 2023-05-22 DIAGNOSIS — E1122 Type 2 diabetes mellitus with diabetic chronic kidney disease: Secondary | ICD-10-CM | POA: Diagnosis present

## 2023-05-22 DIAGNOSIS — E785 Hyperlipidemia, unspecified: Secondary | ICD-10-CM | POA: Diagnosis present

## 2023-05-22 DIAGNOSIS — F0390 Unspecified dementia without behavioral disturbance: Secondary | ICD-10-CM | POA: Diagnosis present

## 2023-05-22 DIAGNOSIS — Z833 Family history of diabetes mellitus: Secondary | ICD-10-CM | POA: Diagnosis not present

## 2023-05-22 DIAGNOSIS — Z96651 Presence of right artificial knee joint: Secondary | ICD-10-CM | POA: Diagnosis present

## 2023-05-22 DIAGNOSIS — Z6841 Body Mass Index (BMI) 40.0 and over, adult: Secondary | ICD-10-CM | POA: Diagnosis not present

## 2023-05-22 DIAGNOSIS — I4891 Unspecified atrial fibrillation: Secondary | ICD-10-CM | POA: Diagnosis present

## 2023-05-22 DIAGNOSIS — R0602 Shortness of breath: Secondary | ICD-10-CM | POA: Diagnosis present

## 2023-05-22 DIAGNOSIS — Z8249 Family history of ischemic heart disease and other diseases of the circulatory system: Secondary | ICD-10-CM | POA: Diagnosis not present

## 2023-05-22 DIAGNOSIS — N184 Chronic kidney disease, stage 4 (severe): Secondary | ICD-10-CM | POA: Diagnosis present

## 2023-05-22 DIAGNOSIS — Z7901 Long term (current) use of anticoagulants: Secondary | ICD-10-CM | POA: Diagnosis not present

## 2023-05-22 DIAGNOSIS — L89151 Pressure ulcer of sacral region, stage 1: Secondary | ICD-10-CM | POA: Diagnosis present

## 2023-05-22 DIAGNOSIS — I5042 Chronic combined systolic (congestive) and diastolic (congestive) heart failure: Secondary | ICD-10-CM | POA: Diagnosis present

## 2023-05-22 DIAGNOSIS — Z803 Family history of malignant neoplasm of breast: Secondary | ICD-10-CM | POA: Diagnosis not present

## 2023-05-22 DIAGNOSIS — L8990 Pressure ulcer of unspecified site, unspecified stage: Secondary | ICD-10-CM | POA: Insufficient documentation

## 2023-05-22 DIAGNOSIS — J8410 Pulmonary fibrosis, unspecified: Secondary | ICD-10-CM | POA: Diagnosis present

## 2023-05-22 DIAGNOSIS — Z87891 Personal history of nicotine dependence: Secondary | ICD-10-CM | POA: Diagnosis not present

## 2023-05-22 LAB — GLUCOSE, CAPILLARY
Glucose-Capillary: 154 mg/dL — ABNORMAL HIGH (ref 70–99)
Glucose-Capillary: 178 mg/dL — ABNORMAL HIGH (ref 70–99)
Glucose-Capillary: 216 mg/dL — ABNORMAL HIGH (ref 70–99)
Glucose-Capillary: 253 mg/dL — ABNORMAL HIGH (ref 70–99)
Glucose-Capillary: 316 mg/dL — ABNORMAL HIGH (ref 70–99)

## 2023-05-22 LAB — PROTIME-INR
INR: 2.3 — ABNORMAL HIGH (ref 0.8–1.2)
Prothrombin Time: 25.3 seconds — ABNORMAL HIGH (ref 11.4–15.2)

## 2023-05-22 MED ORDER — WARFARIN SODIUM 2 MG PO TABS
2.0000 mg | ORAL_TABLET | Freq: Once | ORAL | Status: AC
  Administered 2023-05-22: 2 mg via ORAL
  Filled 2023-05-22: qty 1

## 2023-05-22 MED ORDER — IPRATROPIUM-ALBUTEROL 0.5-2.5 (3) MG/3ML IN SOLN
3.0000 mL | Freq: Two times a day (BID) | RESPIRATORY_TRACT | Status: DC
  Administered 2023-05-22 – 2023-05-23 (×2): 3 mL via RESPIRATORY_TRACT
  Filled 2023-05-22: qty 3

## 2023-05-22 MED ORDER — WARFARIN - PHARMACIST DOSING INPATIENT: Freq: Every day | Status: DC

## 2023-05-22 MED ORDER — GUAIFENESIN 100 MG/5ML PO LIQD
5.0000 mL | ORAL | Status: DC | PRN
  Administered 2023-05-22 – 2023-05-23 (×4): 5 mL via ORAL
  Filled 2023-05-22 (×4): qty 10

## 2023-05-22 NOTE — Progress Notes (Signed)
Progress Note   Patient: [name] LKG:[id number] DOB: 1941-05-22 DOA: 05/21/2023     0 DOS: the patient was seen and examined on 05/22/2023   Brief hospital course: [name] is a 82 y.o. female with medical history significant of chronic combined systolic and diastolic heart failure, COPD, pulmonary fibrosis, type 2 diabetes, CKD stage IV, atrial fibrillation on Coumadin, hypertension, hyperlipidemia, GERD who presents to the ED due to shortness of breath. She is admitted to hospitalist service for further management.  Assessment and Plan: * COPD with acute exacerbation (HCC) Continue to monitor vitals, saturations closely.  Continuous pulse oximetry with goal oxygen saturation above 88% Continue prednisone 40 mg tomorrow to complete a 5-day course Procal negative, stopped antibiotics. DuoNebs every 6 hours, continue home bronchodilators  Chronic combined systolic and diastolic heart failure (HCC) Resumed home Lasix. Fluid, salt restriction advised. Monitor daily weights, strict input and output. Recommend outpatient follow-up with cardiology  Atrial fibrillation with RVR (HCC) Continue telemetry monitoring. Heart rate stable. Continue home dose metoprolol 12.5 mg twice daily Restart Coumadin per pharmacy dosing  Pulmonary fibrosis [facility]) Outpatient pulmonary follow-up suggested.  Diabetes (HCC) Continue Accu-Cheks, SSI, moderate. Patient's blood sugars may remain high as she is on steroids for COPD. Started Semglee 20 mg at bedtime Hold home antiglycemic agents  CKD stage 4 due to type 2 diabetes mellitus (HCC) Renal function based on. Monitor daily BMP.  Essential hypertension Blood pressure stable.  Continue to monitor  Long term (current) use of anticoagulants Pharmacy consult for warfarin management  Dementia without behavioral disturbance (HCC) Depression Continue Celexa, Aricept.  DVT prophylaxis- Coumadin. Nursing supportive care. PT  evaluation. CODE STATUS- DNR.        Subjective: Patient seen and examined today morning.  She is working with physical therapy.  Feels mild short of breath, poor appetite.  Has constipation, last bowel movement Friday.  Physical Exam: Vitals:   05/22/23 0727 05/22/23 0738 05/22/23 0808 05/22/23 1143  BP:  109/60 122/69 104/60  Pulse:  (!) 109  (!) 108  Resp:  14  18  Temp:  98.4 F (36.9 C)  98.2 F (36.8 C)  TempSrc:  Oral  Oral  SpO2: 96% 98%  94%  Weight:      Height:       General - Elderly Caucasian obese female, mild respiratory stress HEENT - PERRLA, EOMI, atraumatic head, non tender sinuses. Lung - Clear, diffuse Rales wheezes. Heart - S1, S2 heard, no murmurs, rubs, trace pedal edema. Abdomen soft, nontender, obese Neuro - Alert, awake and oriented x 3, non focal exam. Skin - Warm and dry. Data Reviewed:     Latest Ref Rng & Units 05/22/2023    4:52 AM 05/21/2023    8:43 AM 03/15/2023    5:22 PM  CBC  WBC 4.0 - 10.5 K/uL 9.5  10.4  8.6   Hemoglobin 12.0 - 15.0 g/dL 66.4  40.3  47.4   Hematocrit 36.0 - 46.0 % 30.6  35.3  32.4   Platelets 150 - 400 K/uL 252  246  209        Latest Ref Rng & Units 05/22/2023    4:52 AM 05/21/2023   10:15 PM 05/21/2023    8:43 AM  BMP  Glucose 70 - 99 mg/dL 90  259  563   BUN 8 - 23 mg/dL 37   34   Creatinine 8.75 - 1.00 mg/dL 6.43   3.29   Sodium 518 - 145 mmol/L  135   138   Potassium 3.5 - 5.1 mmol/L 4.6   3.1   Chloride 98 - 111 mmol/L 104   101   CO2 22 - 32 mmol/L 25   24   Calcium 8.9 - 10.3 mg/dL 8.5   8.5    Family Communication: Patient understands and agrees with the above care plan  Disposition: Status is: Inpatient Remains inpatient appropriate because: Admission changed to inpatient due to her being short of breath, regarding COPD management, PT eval for safe dispo plan  Planned Discharge Destination: Home with Home Health    Time spent: 45 minutes  Author: Marcelino Duster, MD 05/22/2023 1:15  PM  For on call review [email or web address].

## 2023-05-22 NOTE — Evaluation (Signed)
Occupational Therapy Evaluation Patient Details Name: [name] MRN: [id number] DOB: [birth year] Today's Date: 05/22/2023   History of Present Illness [name] is a 82 y/o F with PMH hincluding DM2, a-fib, CKD4, pulmonaryu fibrosis, CHF, colitis, dementia. Admitted with COPD exacerbation after 1 week of shortness of breath.   Clinical Impression   Patient received for OT evaluation. See flowsheet below for details of function. Generally, patient MOD (I) for bed mobility (sit to supine), CGA with RW and gait belt for functional mobility (sidestep transferring only, as pt very shaky in all extremities while up on her feet today), and supervision-MIN A for ADLs (mostly seated only at this time due to shakiness while standing). Pt t/f from chair to [facility] to bed during session today. Patient will benefit from continued OT while in acute care.       Recommendations for follow up therapy are one component of a multi-disciplinary discharge planning process, led by the attending physician.  Recommendations may be updated based on patient status, additional functional criteria and insurance authorization.   Assistance Recommended at Discharge Intermittent Supervision/Assistance  Patient can return home with the following A little help with walking and/or transfers;A little help with bathing/dressing/bathroom;Assistance with cooking/housework;Direct supervision/assist for medications management;Direct supervision/assist for financial management;Assist for transportation;Help with stairs or ramp for entrance    Functional Status Assessment  Patient has had a recent decline in their functional status and demonstrates the ability to make significant improvements in function in a reasonable and predictable amount of time.  Equipment Recommendations  Other (comment) (defer to next venue of care)    Recommendations for Other Services       Precautions / Restrictions Precautions Precautions:  Fall Restrictions Weight Bearing Restrictions: No      Mobility Bed Mobility Overal bed mobility: Modified Independent             General bed mobility comments: Pt perform sit to supine without OT assistance. Pt able to elevate BIL LE in bed for brief moment while OT slides pillow under her knees.    Transfers Overall transfer level: Needs assistance Equipment used: Rolling walker (2 wheels) Transfers: Sit to/from Stand Sit to Stand: Min guard           General transfer comment: with RW; gait belt for safety. pt shaky during transfer, but not appearing to be buckling.      Balance Overall balance assessment: Mild deficits observed, not formally tested                                         ADL either performed or assessed with clinical judgement   ADL Overall ADL's : Needs assistance/impaired                     Lower Body Dressing: Minimal assistance Lower Body Dressing Details (indicate cue type and reason): OT assisting with donning slip on shoes while pt in chair; once at EOB, pt able to doff shoes without assistance. Toilet Transfer: Chemical engineer (2 wheels) Toilet Transfer Details (indicate cue type and reason): to [facility] next to bed; gait belt used for safety; pt shaking in whole body during transfer; she states this happens when she gets sick. Toileting- Architect and Hygiene: Min guard;Sit to/from stand Toileting - Clothing Manipulation Details (indicate cue type and reason): Pt performing hygiene while leaning forward/ half stand while  OT providing CGA for safety; RW in front of pt for balance.       General ADL Comments: Currently at mostly seated ADL level; not safe to t/f alone or to stand for ADLs at this time due to shakiness in body during standing/transferring.     Vision Patient Visual Report: No change from baseline       Perception     Praxis      Pertinent Vitals/Pain Pain  Assessment Pain Assessment: No/denies pain     Hand Dominance     Extremity/Trunk Assessment Upper Extremity Assessment Upper Extremity Assessment: Overall WFL for tasks assessed (4/5 strength upon testing)   Lower Extremity Assessment Lower Extremity Assessment: Defer to PT evaluation       Communication Communication Communication: No difficulties   Cognition Arousal/Alertness: Awake/alert (states that she feels like the cough medication is making her sleepy while sitting up in the chair; is requesting to return to bed.) Behavior During Therapy: WFL for tasks assessed/performed Overall Cognitive Status: Within Functional Limits for tasks assessed                                 General Comments: Pt has dx of dementia, but today is alert and oriented x4; very pleasant, follows all cues.     General Comments  Pt on room air today; no complaints of shortness of breath.    Exercises     Shoulder Instructions      Home Living Family/patient expects to be discharged to:: Private residence Living Arrangements: Children (son and daughter and granddaughter) Available Help at Discharge: Family;Available 24 hours/day Type of Home: Mobile home Home Access: Ramped entrance     Home Layout: One level     Bathroom Shower/Tub: Producer, television/film/video: Standard     Home Equipment: Rollator (4 wheels);Gilmer Mor - single point   Additional Comments: Pt goes to adult day services program during the day M-F; transport Zenaida Niece picks her up.      Prior Functioning/Environment Prior Level of Function : Needs assist             Mobility Comments: Pt states she walks with 4ww in her home. Denies falls. Gets out to adult day program daily M-F. ADLs Comments: (I) with BADLs per her report. Family assists with IADLs (laundry, meals, driving, medication management). Pt enjoys games and crafts at the adult day center.        OT Problem List: Decreased activity  tolerance;Impaired balance (sitting and/or standing)      OT Treatment/Interventions: Self-care/ADL training;Therapeutic exercise;Therapeutic activities;Patient/family education    OT Goals(Current goals can be found in the care plan section) Acute Rehab OT Goals Patient Stated Goal: Get better; go home OT Goal Formulation: With patient Time For Goal Achievement: 06/05/23 Potential to Achieve Goals: Good ADL Goals Pt Will Perform Grooming: with modified independence;standing Pt Will Perform Lower Body Dressing: with modified independence;sit to/from stand Pt Will Transfer to Toilet: with modified independence;grab bars;ambulating;regular height toilet Pt Will Perform Toileting - Clothing Manipulation and hygiene: with modified independence;sit to/from stand Pt Will Perform Tub/Shower Transfer: Shower transfer;with modified independence;ambulating;grab bars  OT Frequency: Min 1X/week    Co-evaluation              AM-PAC OT "6 Clicks" Daily Activity     Outcome Measure Help from another person eating meals?: None Help from another person taking care of personal grooming?:  A Little Help from another person toileting, which includes using toliet, bedpan, or urinal?: A Little Help from another person bathing (including washing, rinsing, drying)?: A Lot Help from another person to put on and taking off regular upper body clothing?: None Help from another person to put on and taking off regular lower body clothing?: A Little 6 Click Score: 19   End of Session Equipment Utilized During Treatment: Gait belt;Rolling walker (2 wheels) Nurse Communication: Other (comment);Mobility status (Pt's IV fell out of L arm during session.)  Activity Tolerance: Patient limited by fatigue;Other (comment) (limited by shakiness) Patient left: in bed;with call bell/phone within reach;with bed alarm set;with nursing/sitter in room (NA in room assisting with pt's L arm where IV came out during  session.)  OT Visit Diagnosis: Unsteadiness on feet (R26.81)                Time: 7829-5621 OT Time Calculation (min): 17 min Charges:  OT General Charges $OT Visit: 1 Visit OT Evaluation $OT Eval Moderate Complexity: 1 Mod  Torrey Ballinas Junie Panning, MS, OTR/L  Alvester Morin 05/22/2023, 11:20 AM

## 2023-05-22 NOTE — TOC Initial Note (Signed)
Transition of Care [facility]) - Initial/Assessment Note    Patient Details  Name: [name] MRN: [id number] Date of Birth: [birth year]  Transition of Care [facility]) CM/SW Contact:    Allena Katz, LCSW Phone Number: 05/22/2023, 2:48 PM  Clinical Narrative:      CSW spoke with pt regarding SNF pt reports she would like top go home. Pt states she is also not agreeable to home health and that she goes to a day program during the day and does therapy 5 times a week. Pt lives with her son and granddaughter and reports she has around the clock care. Pt uses a rollator to ambulate and reports she just needs a wheelchair. CSW to order DME for patient.   Per PT notes pt would need EMS transport to get home. Pt states that she is agreeable to this.           Expected Discharge Plan: Home/Self Care Barriers to Discharge: Continued Medical Work up   Patient Goals and CMS Choice Patient states their goals for this hospitalization and ongoing recovery are:: return home   Choice offered to / list presented to : Patient      Expected Discharge Plan and Services                                              Prior Living Arrangements/Services   Lives with:: Adult Children Patient language and need for interpreter reviewed:: Yes Do you feel safe going back to the place where you live?: Yes      Need for Family Participation in Patient Care: Yes (Comment)   Current home services: DME Criminal Activity/Legal Involvement Pertinent to Current Situation/Hospitalization: No - Comment as needed  Activities of Daily Living Home Assistive Devices/Equipment: Cane (specify quad or straight), Wheelchair ADL Screening (condition at time of admission) Patient's cognitive ability adequate to safely complete daily activities?: Yes Is the patient deaf or have difficulty hearing?: Yes Does the patient have difficulty seeing, even when wearing glasses/contacts?: No Does the patient have difficulty  concentrating, remembering, or making decisions?: No Patient able to express need for assistance with ADLs?: Yes Does the patient have difficulty dressing or bathing?: No Independently performs ADLs?: Yes (appropriate for developmental age) Does the patient have difficulty walking or climbing stairs?: Yes Weakness of Legs: Both Weakness of Arms/Hands: Both  Permission Sought/Granted                  Emotional Assessment       Orientation: : Oriented to Self, Oriented to Place, Oriented to  Time, Oriented to Situation      Admission diagnosis:  Shortness of breath [R06.02] COPD with acute exacerbation (HCC) [J44.1] COPD exacerbation (HCC) [J44.1] Patient Active Problem List   Diagnosis Date Noted   COPD exacerbation (HCC) 05/22/2023   Pressure injury of skin 05/22/2023   COPD with acute exacerbation (HCC) 05/21/2023   Pulmonary fibrosis (HCC) 05/21/2023   Dementia without behavioral disturbance (HCC) 05/21/2023   Abdominal pain, chronic, right upper quadrant 11/17/2022   Radial styloid tenosynovitis of left hand 07/26/2022   Pain in thumb joint with movement of left hand 05/29/2022   Acute pain of left wrist 05/29/2022   Closed fracture of left upper limb    Fall 11/30/2021   Traumatic closed fracture of surgical neck of left humerus with minimal displacement 11/30/2021  Ingrown nail of great toe of left foot 08/18/2021   Acute on chronic diastolic congestive heart failure (HCC) 05/24/2021   Type II diabetes mellitus with renal manifestations (HCC) 05/24/2021   HLD (hyperlipidemia) 05/24/2021   Chest pain 05/24/2021   Depression 05/24/2021   Legally blind 05/24/2021   Abdominal pain 05/24/2021   Chronic respiratory failure with hypoxia (HCC) 03/16/2021   SOB (shortness of breath) 03/15/2021   Benign hypertensive kidney disease with chronic kidney disease 03/14/2021   Chronic kidney disease, stage IV (severe) (HCC) 03/14/2021   Acute renal failure superimposed on  stage 4 chronic kidney disease (HCC) 02/08/2021   Anemia associated with stage 4 chronic renal failure (HCC) 02/08/2021   Anemia due to stage 5 chronic kidney disease (HCC) 02/07/2021   Acute CHF (congestive heart failure) (HCC) 02/06/2021   Acute on chronic diastolic CHF (congestive heart failure) (HCC) 02/06/2021   Hyperkalemia 02/06/2021   Weakness 12/24/2020   Hypertensive urgency 12/23/2020   Nicotine dependence 12/23/2020   History of anemia due to chronic kidney disease 12/23/2020   Pain due to onychomycosis of toenails of both feet 02/12/2020   Uncontrolled type 2 diabetes mellitus with hyperglycemia (HCC) 05/20/2019   Acute recurrent pansinusitis 05/11/2019   Acute otitis media 05/11/2019   Acute pain of left shoulder 05/11/2019   Long term (current) use of anticoagulants 04/03/2019   Chronic left shoulder pain 03/18/2019   Sepsis (HCC) 01/12/2018   Colitis 01/12/2018   CKD (chronic kidney disease), stage III (HCC) 01/12/2018   Diabetes (HCC) 12/28/2017   AF (paroxysmal atrial fibrillation) (HCC) 12/28/2017   Encounter for general adult medical examination with abnormal findings 12/28/2017   Primary generalized (osteo)arthritis 12/28/2017   Hypertension 12/27/2017   GERD (gastroesophageal reflux disease) 12/27/2017   Low back pain 02/20/2017   Degeneration of lumbar intervertebral disc 02/20/2017   Hematuria 03/18/2015   Chronic combined systolic and diastolic heart failure (HCC) 03/01/2012   Obesity 03/01/2012   CKD stage 4 due to type 2 diabetes mellitus (HCC) 03/01/2012   Other benign neoplasm of connective and other soft tissue of lower limb, including hip 03/15/2011   Neoplasm of connective tissue 02/01/2011   Pain in joint, pelvic region and thigh 02/01/2011   Type 2 diabetes mellitus with stage 5 chronic kidney disease (HCC) 10/03/2006   Encounter for current long-term use of anticoagulants 02/07/2006   Essential hypertension 09/24/2004   Atrial fibrillation with  RVR (HCC) 06/07/2004   PCP:  Sallyanne Kuster, NP Pharmacy:   [facility] [address]), [place] - [address] [place] [address] Phone: [phone] Fax: [phone]  [id number] [place], [place] - [address] AT [facility] [address] [place] [address] Phone: [phone] Fax: [phone]     Social Determinants of Health (SDOH) Social History: SDOH Screenings   Food Insecurity: No Food Insecurity (05/21/2023)  Housing: Low Risk  (05/21/2023)  Transportation Needs: No Transportation Needs (05/21/2023)  Utilities: Not At Risk (05/21/2023)  Alcohol Screen: Low Risk  (03/14/2022)  Depression (PHQ2-9): Low Risk  (04/09/2023)  Tobacco Use: Medium Risk (05/21/2023)   SDOH Interventions:     Readmission Risk Interventions    06/29/2021    8:45 AM 05/27/2021    2:34 PM  Readmission Risk Prevention Plan  Transportation Screening Complete Complete  Medication Review (RN Care Manager) Complete Complete  PCP or Specialist appointment within 3-5 days of discharge Complete  Complete  HRI or Home Care Consult Complete Complete  SW Recovery Care/Counseling Consult Complete Complete  Palliative Care Screening Not Applicable Complete  Skilled Nursing Facility Not Applicable Complete

## 2023-05-22 NOTE — Evaluation (Signed)
Physical Therapy Evaluation Patient Details Name: [name] MRN: [id number] DOB: 1940-12-25 Today's Date: 05/22/2023  History of Present Illness  Pt is an 82 y.o. female presenting to hospital 05/21/23 with c/o SOB x1 week and productive cough.  Pt admitted with COPD acute exacerbation, chronic combined systolic and diastolic HF, and a-fib with RVR.  PMH includes COPD, CHF, pulmonary fibrosis, DM, CKD, a-fib on Coumadin, htn, HLD, R knee replacement.  Clinical Impression  Prior to recent medical concerns, pt was modified independent ambulating with rollator; lives with family (who provides 24/7 supervision) in 1 level home with ramp to enter; goes to adult day program 5 days a week.  No c/o pain during session.  Currently pt is SBA semi-supine to sitting edge of bed; min assist with transfers; and CGA to min assist to ambulate a few feet bed to recliner with RW use.  Pt appearing very shaky (whole body) during standing activities--pt reports this is typical for her when she gets sick; no shakiness noted in sitting; nurse notified.  Pt would currently benefit from skilled PT to address noted impairments and functional limitations (see below for any additional details).  Upon hospital discharge, pt would benefit from ongoing therapy.      If plan is discharge home, recommend the following: A lot of help with walking and/or transfers;A little help with bathing/dressing/bathroom;Assistance with cooking/housework;Assist for transportation;Help with stairs or ramp for entrance   Can travel by private vehicle   No    Equipment Recommendations Rolling walker (2 wheels);BSC/3in1;Wheelchair (measurements PT);Wheelchair cushion (measurements PT)  Recommendations for Other Services       Functional Status Assessment Patient has had a recent decline in their functional status and demonstrates the ability to make significant improvements in function in a reasonable and predictable amount of time.      Precautions / Restrictions Precautions Precautions: Fall Restrictions Weight Bearing Restrictions: No      Mobility  Bed Mobility Overal bed mobility: Needs Assistance Bed Mobility: Supine to Sit     Supine to sit: Supervision, HOB elevated     General bed mobility comments: increased effort to perform on own; use of bed rail    Transfers Overall transfer level: Needs assistance Equipment used: Rolling walker (2 wheels) Transfers: Sit to/from Stand Sit to Stand: Min assist           General transfer comment: x2 trials from bed; assist to steady d/t pt very shaky; vc's for UE placement    Ambulation/Gait Ambulation/Gait assistance: Min guard, Min assist Gait Distance (Feet): 3 Feet (bed to recliner) Assistive device: Rolling walker (2 wheels)   Gait velocity: decreased     General Gait Details: short steps; pt very shaky in general (although knees did not appear to be buckling) requiring assist for balance/safety  Stairs            Wheelchair Mobility     Tilt Bed    Modified Rankin (Stroke Patients Only)       Balance Overall balance assessment: Needs assistance Sitting-balance support: No upper extremity supported, Feet supported Sitting balance-Leahy Scale: Good Sitting balance - Comments: steady reaching within BOS   Standing balance support: Bilateral upper extremity supported, During functional activity Standing balance-Leahy Scale: Poor Standing balance comment: assist for balance in standing d/t shakiness                             Pertinent Vitals/Pain Pain Assessment Pain  Assessment: No/denies pain Vitals (HR and SpO2 on room air) stable and WFL throughout treatment session.    Home Living Family/patient expects to be discharged to:: Private residence Living Arrangements: Children (Son, daughter, and granddaughter) Available Help at Discharge: Family;Available 24 hours/day Type of Home: Mobile home Home Access:  Ramped entrance       Home Layout: One level Home Equipment: Rollator (4 wheels);Cane - single point;Grab bars - tub/shower Additional Comments: Pt goes to adult day services program during the day M-F; transport Zenaida Niece picks her up.    Prior Function Prior Level of Function : Needs assist             Mobility Comments: Modified independent ambulating with rollator within home.  Pt denies any recent falls.  Exercises at adult day program (M-F). ADLs Comments: Per OT eval "(I) with BADLs per her report. Family assists with IADLs (laundry, meals, driving, medication management). Pt enjoys games and crafts at the adult day center."     Hand Dominance        Extremity/Trunk Assessment   Upper Extremity Assessment Upper Extremity Assessment: Overall WFL for tasks assessed;Defer to OT evaluation    Lower Extremity Assessment Lower Extremity Assessment: Generalized weakness       Communication   Communication: No difficulties  Cognition Arousal/Alertness: Awake/alert Behavior During Therapy: WFL for tasks assessed/performed Overall Cognitive Status: Within Functional Limits for tasks assessed                                 General Comments: A&Ox4        General Comments General comments (skin integrity, edema, etc.): No c/o SOB.  Nursing cleared pt for participation in physical therapy.  Pt agreeable to PT session.    Exercises     Assessment/Plan    PT Assessment Patient needs continued PT services  PT Problem List Decreased strength;Decreased activity tolerance;Decreased balance;Decreased mobility;Decreased knowledge of precautions       PT Treatment Interventions DME instruction;Gait training;Functional mobility training;Therapeutic activities;Therapeutic exercise;Balance training;Patient/family education    PT Goals (Current goals can be found in the Care Plan section)  Acute Rehab PT Goals Patient Stated Goal: to improve strength and  mobility PT Goal Formulation: With patient Time For Goal Achievement: 06/05/23 Potential to Achieve Goals: Good    Frequency Min 1X/week     Co-evaluation               AM-PAC PT "6 Clicks" Mobility  Outcome Measure Help needed turning from your back to your side while in a flat bed without using bedrails?: None Help needed moving from lying on your back to sitting on the side of a flat bed without using bedrails?: None Help needed moving to and from a bed to a chair (including a wheelchair)?: A Little Help needed standing up from a chair using your arms (e.g., wheelchair or bedside chair)?: A Little Help needed to walk in hospital room?: A Lot Help needed climbing 3-5 steps with a railing? : Total 6 Click Score: 17    End of Session Equipment Utilized During Treatment: Gait belt Activity Tolerance: Patient tolerated treatment well;Patient limited by fatigue Patient left: in chair;with call bell/phone within reach;with chair alarm set Nurse Communication: Mobility status;Precautions;Other (comment) (Pt's shakiness) PT Visit Diagnosis: Unsteadiness on feet (R26.81);Other abnormalities of gait and mobility (R26.89);Muscle weakness (generalized) (M62.81)    Time: 0272-5366 PT Time Calculation (min) (ACUTE ONLY): 19 min  Charges:   PT Evaluation $PT Eval Low Complexity: 1 Low PT Treatments $Therapeutic Activity: 8-22 mins PT General Charges $$ ACUTE PT VISIT: 1 Visit        Hendricks Limes, PT 05/22/23, 1:00 PM

## 2023-05-22 NOTE — Progress Notes (Addendum)
ANTICOAGULATION CONSULT NOTE - Initial Consult  Pharmacy Consult for warfarin Indication: atrial fibrillation  No Known Allergies  Patient Measurements: Height: 4\' 5"  (134.6 cm) Weight: 71.6 kg (157 lb 13.6 oz) IBW/kg (Calculated) : 29.4 Warfarin:  Home Regimen: 2 mg Tues/Thurs/Fri/Sat/Sun; 4 mg Mon/Wednes  Vital Signs: Temp: 98.4 F (36.9 C) (07/30 0738) Temp Source: Oral (07/30 0738) BP: 122/69 (07/30 0808) Pulse Rate: 109 (07/30 0738)  Labs: Recent Labs    05/21/23 0843 05/21/23 1703 05/22/23 0452  HGB 11.3*  --  10.0*  HCT 35.3*  --  30.6*  PLT 246  --  252  APTT 51*  --   --   LABPROT  --  26.6*  --   INR  --  2.4*  --   CREATININE 2.43*  --  2.53*    Estimated Creatinine Clearance: 12.5 mL/min (A) (by C-G formula based on SCr of 2.53 mg/dL (H)).   Medical History: Past Medical History:  Diagnosis Date   Anemia    Atrial fibrillation (HCC)    Chronic kidney disease    Congestive heart failure (CHF) (HCC)    COPD (chronic obstructive pulmonary disease) (HCC)    Diabetes mellitus without complication (HCC)    GERD (gastroesophageal reflux disease)    Hyperlipidemia    Hypertension    Pneumonia    Pulmonary fibrosis (HCC)     Medications:  Scheduled:   amLODipine  5 mg Oral Daily   atorvastatin  10 mg Oral QPM   citalopram  20 mg Oral Daily   cyanocobalamin  1,000 mcg Oral Daily   donepezil  5 mg Oral q morning   ferrous sulfate  325 mg Oral QPM   insulin aspart  0-15 Units Subcutaneous TID WC   insulin glargine-yfgn  20 Units Subcutaneous QHS   ipratropium-albuterol  3 mL Nebulization BID   linaclotide  145 mcg Oral QHS   metoprolol tartrate  12.5 mg Oral BID   montelukast  10 mg Oral QHS   pantoprazole  40 mg Oral BID   predniSONE  40 mg Oral Q breakfast   rOPINIRole  0.5 mg Oral QPM   sodium chloride flush  3 mL Intravenous Q12H   torsemide  50 mg Oral Daily    Assessment: MS is a 82 YO female on long-term coumadin for afib (CHADSVASc  6). Stable home regimen. Baseline Scr: ~2.4 (CKD stage IV). Missed dose 7/29.  Goal of Therapy:  INR 2-3 Monitor platelets by anticoagulation protocol: Yes   Date    INR      Warfarin Dose    7/29 2.3 2 mg 7/30 2.4 hold  Plan:  Give warfarin 2 mg x1 today Monitoring INR levels & plt daily. Will adjust as needed based on INR  Effie Shy 05/22/2023,9:25 AM

## 2023-05-23 DIAGNOSIS — I4891 Unspecified atrial fibrillation: Secondary | ICD-10-CM | POA: Diagnosis not present

## 2023-05-23 DIAGNOSIS — J441 Chronic obstructive pulmonary disease with (acute) exacerbation: Secondary | ICD-10-CM | POA: Diagnosis not present

## 2023-05-23 DIAGNOSIS — J8410 Pulmonary fibrosis, unspecified: Secondary | ICD-10-CM | POA: Diagnosis not present

## 2023-05-23 DIAGNOSIS — I5042 Chronic combined systolic (congestive) and diastolic (congestive) heart failure: Secondary | ICD-10-CM | POA: Diagnosis not present

## 2023-05-23 LAB — GLUCOSE, CAPILLARY
Glucose-Capillary: 212 mg/dL — ABNORMAL HIGH (ref 70–99)
Glucose-Capillary: 195 mg/dL — ABNORMAL HIGH (ref 70–99)

## 2023-05-23 MED ORDER — PREDNISONE 20 MG PO TABS: 20.0000 mg | ORAL_TABLET | Freq: Every day | ORAL | 0 refills | Status: AC

## 2023-05-23 MED ORDER — ALBUTEROL SULFATE HFA 108 (90 BASE) MCG/ACT IN AERS: 2.0000 | INHALATION_SPRAY | Freq: Four times a day (QID) | RESPIRATORY_TRACT | 2 refills | Status: DC | PRN

## 2023-05-23 MED ORDER — IPRATROPIUM-ALBUTEROL 0.5-2.5 (3) MG/3ML IN SOLN: 3.0000 mL | RESPIRATORY_TRACT | Status: DC | PRN

## 2023-05-23 MED ORDER — GUAIFENESIN 100 MG/5ML PO LIQD: 5.0000 mL | ORAL | 0 refills | Status: DC | PRN

## 2023-05-23 MED ORDER — WARFARIN SODIUM 4 MG PO TABS
4.0000 mg | ORAL_TABLET | Freq: Once | ORAL | Status: DC
  Filled 2023-05-23: qty 1

## 2023-05-23 NOTE — Consult Note (Signed)
ANTICOAGULATION CONSULT NOTE - Follow Up Consult  Pharmacy Consult for warfarin Indication: atrial fibrillation  No Known Allergies  Patient Measurements: Height: 4\' 5"  (134.6 cm) Weight: 74.4 kg (164 lb 0.4 oz) IBW/kg (Calculated) : 29.4   Vital Signs: Temp: 98.8 F (37.1 C) (07/31 0755) Temp Source: Oral (07/31 0546) BP: 128/62 (07/31 0833) Pulse Rate: 95 (07/31 0755)  Labs: Recent Labs    05/21/23 0843 05/21/23 1703 05/22/23 0452 05/22/23 0939 05/23/23 0445  HGB 11.3*  --  10.0*  --  9.5*  HCT 35.3*  --  30.6*  --  28.5*  PLT 246  --  252  --  249  APTT 51*  --   --   --   --   LABPROT  --  26.6*  --  25.3* 22.1*  INR  --  2.4*  --  2.3* 1.9*  CREATININE 2.43*  --  2.53*  --   --     Estimated Creatinine Clearance: 12.8 mL/min (A) (by C-G formula based on SCr of 2.53 mg/dL (H)).   Medications:  Scheduled:   amLODipine  5 mg Oral Daily   atorvastatin  10 mg Oral QPM   citalopram  20 mg Oral Daily   cyanocobalamin  1,000 mcg Oral Daily   donepezil  5 mg Oral q morning   ferrous sulfate  325 mg Oral QPM   insulin aspart  0-15 Units Subcutaneous TID WC   insulin glargine-yfgn  20 Units Subcutaneous QHS   linaclotide  145 mcg Oral QHS   metoprolol tartrate  12.5 mg Oral BID   montelukast  10 mg Oral QHS   pantoprazole  40 mg Oral BID   predniSONE  40 mg Oral Q breakfast   rOPINIRole  0.5 mg Oral QPM   sodium chloride flush  3 mL Intravenous Q12H   torsemide  50 mg Oral Daily   warfarin  4 mg Oral ONCE-1600   Warfarin - Pharmacist Dosing Inpatient   Does not apply q1600    Assessment: [name] is a 82 year old female who presented to the ED on 7/29 with shortness of breath and hospitalized with a COPD exacerbation. On a stable home regimen of warfarin for afib with RVR: 2 mg Tues/Thurs/Fri/Sat/Sun and 4 mg Mon/Wed. Baseline Scr ~2.4 (CKD Stage IV). Missed dose on 7/29.   Date    INR      Warfarin Dose    7/29 2.3   2 mg 7/30 2.4   Hold 7/31 1.9   2  mg  Goal of Therapy:  INR 2-3 Monitor platelets by anticoagulation protocol: Yes   Plan:  Give warfarin 4 mg x1 today Monitoring INR levels & plt daily. Will adjust as needed based on INR  Effie Shy 05/23/2023,9:38 AM

## 2023-05-23 NOTE — Progress Notes (Signed)
Occupational Therapy Treatment Patient Details Name: [name] MRN: [id number] DOB: November 22, 1940 Today's Date: 05/23/2023   History of present illness Pt is an 82 y.o. female presenting to hospital 05/21/23 with c/o SOB x1 week and productive cough.  Pt admitted with COPD acute exacerbation, chronic combined systolic and diastolic HF, and a-fib with RVR.  PMH includes COPD, CHF, pulmonary fibrosis, DM, CKD, a-fib on Coumadin, htn, HLD, R knee replacement.   OT comments  Pt received semi-reclined in bed, eating pancakes. Appearing alert; willing to work with OT on toileting. T/f CGA with gait belt and RW to [facility], then to chair. See flowsheet below for further details of session. Left with BIL LE elevated in recliner, chair alarm on, with all needs in reach.  Patient will benefit from continued OT while in acute care.    Recommendations for follow up therapy are one component of a multi-disciplinary discharge planning process, led by the attending physician.  Recommendations may be updated based on patient status, additional functional criteria and insurance authorization.    Assistance Recommended at Discharge Intermittent Supervision/Assistance  Patient can return home with the following  A little help with walking and/or transfers;A little help with bathing/dressing/bathroom;Assistance with cooking/housework;Direct supervision/assist for medications management;Direct supervision/assist for financial management;Assist for transportation;Help with stairs or ramp for entrance   Equipment Recommendations  None recommended by OT    Recommendations for Other Services      Precautions / Restrictions Precautions Precautions: Fall Restrictions Weight Bearing Restrictions: No       Mobility Bed Mobility Overal bed mobility: Needs Assistance Bed Mobility: Supine to Sit     Supine to sit: Supervision, HOB elevated          Transfers Overall transfer level: Needs  assistance Equipment used: Rolling walker (2 wheels) Transfers: Sit to/from Stand Sit to Stand: Min guard           General transfer comment: gait belt for safety, but no overt loss of balance     Balance Overall balance assessment: Needs assistance Sitting-balance support: No upper extremity supported, Feet supported Sitting balance-Leahy Scale: Good     Standing balance support: Bilateral upper extremity supported, During functional activity Standing balance-Leahy Scale: Fair Standing balance comment: using RW; still shaking, but not as badly                           ADL either performed or assessed with clinical judgement   ADL Overall ADL's : Needs assistance/impaired Eating/Feeding: Set up;Bed level (sitting in chair at end of session to drink milk; eating pancakes bed level when OT arrived)   Grooming: Brushing hair;Sitting;Set up               Lower Body Dressing: Moderate assistance Lower Body Dressing Details (indicate cue type and reason): OT assisted with doffing non-skid socks and donning slip-on shoes; pt states she can normally do this task at home, but today she cannot. Toilet Transfer: Chemical engineer (2 wheels) Toilet Transfer Details (indicate cue type and reason): to [facility] next to bed; gait belt used for safety; pt shaking in whole body during transfer; she states this happens when she gets sick. Toileting- Architect and Hygiene: Min guard;Sit to/from stand Toileting - Clothing Manipulation Details (indicate cue type and reason): Pt performing hygiene while leaning forward/ half stand while OT providing CGA for safety; RW in front of pt for balance.       General ADL  Comments: Will require overall MIN-MOD A for ADLs at home from family, as pt declining rehab at this time. Pt able to have phone call while seated EOB.    Extremity/Trunk Assessment Upper Extremity Assessment Upper Extremity Assessment: Overall WFL  for tasks assessed (continues to be mildly shaky during functional activity)   Lower Extremity Assessment Lower Extremity Assessment: Defer to PT evaluation        Vision       Perception     Praxis      Cognition Arousal/Alertness: Awake/alert Behavior During Therapy: WFL for tasks assessed/performed Overall Cognitive Status: Within Functional Limits for tasks assessed                                 General Comments: A&Ox4        Exercises      Shoulder Instructions       General Comments Pt on room air; appearing to be breathing heavily at EOB; saturation 92-93%.    Pertinent Vitals/ Pain       Pain Assessment Pain Assessment: No/denies pain  Home Living                                          Prior Functioning/Environment              Frequency  Min 1X/week        Progress Toward Goals  OT Goals(current goals can now be found in the care plan section)  Progress towards OT goals: Progressing toward goals  Acute Rehab OT Goals Patient Stated Goal: Go home OT Goal Formulation: With patient Time For Goal Achievement: 06/05/23 Potential to Achieve Goals: Good ADL Goals Pt Will Perform Grooming: with modified independence;standing Pt Will Perform Lower Body Dressing: with modified independence;sit to/from stand Pt Will Transfer to Toilet: with modified independence;grab bars;ambulating;regular height toilet Pt Will Perform Toileting - Clothing Manipulation and hygiene: with modified independence;sit to/from stand Pt Will Perform Tub/Shower Transfer: Shower transfer;with modified independence;ambulating;grab bars  Plan Discharge plan remains appropriate    Co-evaluation                 AM-PAC OT "6 Clicks" Daily Activity     Outcome Measure   Help from another person eating meals?: None Help from another person taking care of personal grooming?: A Little Help from another person toileting, which  includes using toliet, bedpan, or urinal?: A Little Help from another person bathing (including washing, rinsing, drying)?: A Little Help from another person to put on and taking off regular upper body clothing?: None Help from another person to put on and taking off regular lower body clothing?: A Little 6 Click Score: 20    End of Session Equipment Utilized During Treatment: Gait belt;Rolling walker (2 wheels)  OT Visit Diagnosis: Unsteadiness on feet (R26.81)   Activity Tolerance Patient limited by fatigue   Patient Left in chair;with chair alarm set;with call bell/phone within reach   Nurse Communication Mobility status        Time: 4132-4401 OT Time Calculation (min): 22 min  Charges: OT General Charges $OT Visit: 1 Visit OT Treatments $Self Care/Home Management : 8-22 mins  Linward Foster, MS, OTR/L  Alvester Morin 05/23/2023, 9:23 AM

## 2023-05-23 NOTE — Progress Notes (Signed)
Physical Therapy Treatment Patient Details Name: [name] MRN: [id number] DOB: 08/04/1941 Today's Date: 05/23/2023   History of Present Illness Pt is an 82 y.o. female presenting to hospital 05/21/23 with c/o SOB x1 week and productive cough.  Pt admitted with COPD acute exacerbation, chronic combined systolic and diastolic HF, and a-fib with RVR.  PMH includes COPD, CHF, pulmonary fibrosis, DM, CKD, a-fib on Coumadin, htn, HLD, R knee replacement.    PT Comments  Pt was sitting in recliner upon arrival. She is A and O x 4. Agrees to session and remains cooperative throughout. She does require a little assistance to safely stand and ambulate short distance. Per pt," I don't walk this far normally." Author attempted to contact pt's listed emergency contact to confirm pre admission abilities however no answer. Pt also does not want any follow up therapy. She would benefit from continued skilled PT at DC to maximize independence and safety with all ADLs.    If plan is discharge home, recommend the following: A lot of help with walking and/or transfers;A little help with bathing/dressing/bathroom;Assistance with cooking/housework;Assist for transportation;Help with stairs or ramp for entrance     Equipment Recommendations  Rolling walker (2 wheels);BSC/3in1;Wheelchair (measurements PT);Wheelchair cushion (measurements PT)       Precautions / Restrictions Precautions Precautions: Fall Restrictions Weight Bearing Restrictions: No     Mobility  Bed Mobility  General bed mobility comments: In recliner pre/post session    Transfers Overall transfer level: Needs assistance Equipment used: Rolling walker (2 wheels) Transfers: Sit to/from Stand Sit to Stand: Min guard  General transfer comment: CGA for safety with VCs for sequencing/technique improvements    Ambulation/Gait Ambulation/Gait assistance: Min guard Gait Distance (Feet): 40 Feet Assistive device: Rolling walker (2  wheels) Gait Pattern/deviations: Step-through pattern Gait velocity: decreased  General Gait Details: pt tolerated gait ~ 40 ft with RW + CGA for safety. pt endorses not ambulating even that far at home. Attempted to contact pt's emergency contact number but no answer.   Balance Overall balance assessment: Needs assistance Sitting-balance support: No upper extremity supported, Feet supported Sitting balance-Leahy Scale: Good     Standing balance support: Bilateral upper extremity supported, During functional activity Standing balance-Leahy Scale: Fair      Cognition Arousal/Alertness: Awake/alert Behavior During Therapy: WFL for tasks assessed/performed Overall Cognitive Status: Within Functional Limits for tasks assessed    General Comments: Pt is A and O x4           General Comments General comments (skin integrity, edema, etc.): Pt on room air; appearing to be breathing heavily at EOB; saturation 92-93%.      Pertinent Vitals/Pain Pain Assessment Pain Assessment: No/denies pain     PT Goals (current goals can now be found in the care plan section) Acute Rehab PT Goals Patient Stated Goal: go home Progress towards PT goals: Progressing toward goals    Frequency    Min 1X/week      PT Plan Current plan remains appropriate       AM-PAC PT "6 Clicks" Mobility   Outcome Measure  Help needed turning from your back to your side while in a flat bed without using bedrails?: None Help needed moving from lying on your back to sitting on the side of a flat bed without using bedrails?: A Little Help needed moving to and from a bed to a chair (including a wheelchair)?: A Little Help needed standing up from a chair using your arms (e.g., wheelchair or  bedside chair)?: A Little Help needed to walk in hospital room?: A Little Help needed climbing 3-5 steps with a railing? : A Lot 6 Click Score: 18    End of Session Equipment Utilized During Treatment: Gait  belt Activity Tolerance: Patient tolerated treatment well;Patient limited by fatigue Patient left: in chair;with call bell/phone within reach;with chair alarm set Nurse Communication: Mobility status;Precautions;Other (comment) PT Visit Diagnosis: Unsteadiness on feet (R26.81);Other abnormalities of gait and mobility (R26.89);Muscle weakness (generalized) (M62.81)     Time: 8295-6213 PT Time Calculation (min) (ACUTE ONLY): 11 min  Charges:    $Gait Training: 8-22 mins PT General Charges $$ ACUTE PT VISIT: 1 Visit                    Jetta Lout PTA 05/23/23, 12:02 PM

## 2023-05-23 NOTE — Discharge Summary (Signed)
Physician Discharge Summary   Patient: [name] MRN: [id number] DOB: 03/26/1941  Admit date:     05/21/2023  Discharge date: 05/23/23  Discharge Physician: Marcelino Duster   PCP: Sallyanne Kuster, NP   Recommendations at discharge:    PCP follow up in 1 week.  Discharge Diagnoses: Principal Problem:   COPD with acute exacerbation (HCC) Active Problems:   Chronic combined systolic and diastolic heart failure (HCC)   Atrial fibrillation with RVR (HCC)   Pulmonary fibrosis (HCC)   Diabetes (HCC)   CKD stage 4 due to type 2 diabetes mellitus (HCC)   Essential hypertension   Long term (current) use of anticoagulants   Dementia without behavioral disturbance (HCC)   COPD exacerbation (HCC)   Pressure injury of skin  Resolved Problems:   * No resolved hospital problems. [place] [facility] Course: [name] is a 82 y.o. female with medical history significant of chronic combined systolic and diastolic heart failure, COPD, pulmonary fibrosis, type 2 diabetes, CKD stage IV, atrial fibrillation on Coumadin, hypertension, hyperlipidemia, GERD who presents to the ED due to shortness of breath. She is admitted to hospitalist service for further management.  Patient responded well to the bronchodilators, steroid therapy.  Pro-Cal negative antibiotics stopped.  Patient's home dose Lasix resumed.  Symptoms much improved, did work with physical therapy who advised wheelchair for ambulation.  Patient remains hemodynamically stable to be discharged home.  Advised her to follow-up with PCP, pulmonary upon discharge as instructed.  She understands and agrees with discharge plan.   Assessment and Plan: * COPD with acute exacerbation (HCC) Prednisone 20 mg oral for 3 days. Continue home inhalers, albuterol as needed.   Chronic combined systolic and diastolic heart failure (HCC) Continue home Lasix. Fluid, salt restriction advised. Monitor daily weights, strict input and output. Recommend  outpatient follow-up with cardiology   Atrial fibrillation with RVR (HCC) Continue telemetry monitoring. Heart rate stable. Continue home dose metoprolol 12.5 mg twice daily Patient was restarted on Coumadin therapy.   Pulmonary fibrosis [facility]) Outpatient pulmonary follow-up suggested.   Diabetes (HCC) Patient's home insulin and antiglycemic agents resumed. Advised to keep a log of blood sugars for PCP to adjust medications.   CKD stage 4 due to type 2 diabetes mellitus (HCC) Renal function at baseline Monitor daily BMP.   Long term (current) use of anticoagulants Continue home dose Coumadin therapy   Dementia without behavioral disturbance (HCC) Depression Continue Celexa, Aricept.   PT OT advised wheelchair, DME orders placed.      Consultants: none Procedures performed: none  Disposition: Home Diet recommendation:  Discharge Diet Orders (From admission, onward)     Start     Ordered   05/23/23 0000  Diet - low sodium heart healthy        05/23/23 1137           Cardiac and Carb modified diet DISCHARGE MEDICATION: Allergies as of 05/23/2023   No Known Allergies      Medication List     STOP taking these medications    methocarbamol 500 MG tablet Commonly known as: ROBAXIN   mupirocin ointment 2 % Commonly known as: BACTROBAN       TAKE these medications    albuterol 108 (90 Base) MCG/ACT inhaler Commonly known as: VENTOLIN HFA Inhale 2 puffs into the lungs every 6 (six) hours as needed for wheezing or shortness of breath.   amLODipine 5 MG tablet Commonly known as: NORVASC Take 1 tablet (5 mg  total) by mouth daily.   atorvastatin 10 MG tablet Commonly known as: LIPITOR Take 1 tablet (10 mg total) by mouth every evening.   citalopram 20 MG tablet Commonly known as: CELEXA Take 1 tablet (20 mg total) by mouth daily.   cyanocobalamin 1000 MCG tablet Take 1 tablet (1,000 mcg total) by mouth daily.   cyclobenzaprine 5 MG  tablet Commonly known as: FLEXERIL TAKE 1 TABLET BY MOUTH AT BEDTIME What changed:  when to take this reasons to take this additional instructions   donepezil 5 MG tablet Commonly known as: ARICEPT Take 1 tablet (5 mg total) by mouth every morning.   ferrous sulfate 325 (65 FE) MG tablet Take 1 tablet (325 mg total) by mouth daily with breakfast. What changed: when to take this   guaiFENesin 100 MG/5ML liquid Commonly known as: ROBITUSSIN Take 5 mLs by mouth every 4 (four) hours as needed for cough or to loosen phlegm.   insulin glargine 100 UNIT/ML Solostar Pen Commonly known as: LANTUS Inject 24 Units into the skin daily. What changed: how much to take   linagliptin 5 MG Tabs tablet Commonly known as: Tradjenta Take 1 tablet (5 mg total) by mouth daily.   Linzess 145 MCG Caps capsule Generic drug: linaclotide Take 1 capsule by mouth at bedtime   melatonin 5 MG Tabs Take 5 mg by mouth at bedtime as needed (sleep).   montelukast 10 MG tablet Commonly known as: SINGULAIR TAKE 1 TABLET BY MOUTH AT BEDTIME   ondansetron 4 MG disintegrating tablet Commonly known as: ZOFRAN-ODT Take 1 tablet (4 mg total) by mouth every 6 (six) hours as needed for nausea or vomiting.   oxyCODONE-acetaminophen 5-325 MG tablet Commonly known as: PERCOCET/ROXICET Take 1 tablet by mouth 2 (two) times daily as needed for severe pain.   oxyCODONE-acetaminophen 5-325 MG tablet Commonly known as: PERCOCET/ROXICET Take 1 tablet by mouth 2 (two) times daily as needed for severe pain.   pantoprazole 40 MG tablet Commonly known as: PROTONIX Take 1 tablet (40 mg total) by mouth 2 (two) times daily.   Pen Needles 3/16" 31G X 5 MM Misc 1 applicator by Does not apply route 4 (four) times daily.   predniSONE 20 MG tablet Commonly known as: DELTASONE Take 1 tablet (20 mg total) by mouth daily with breakfast for 3 days. Start taking on: May 24, 2023   rOPINIRole 0.5 MG tablet Commonly known  as: REQUIP Take 1 tablet (0.5 mg total) by mouth every evening.   torsemide 20 MG tablet Commonly known as: DEMADEX Take 2.5 tablets (50 mg total) by mouth daily.   traMADol 50 MG tablet Commonly known as: ULTRAM Take one tab po bid prn for pain What changed:  how much to take how to take this when to take this reasons to take this   warfarin 4 MG tablet Commonly known as: COUMADIN TAKE 1 TABLET BY MOUTH ON MONDAY, [facility] AND FRIDAY What changed:  how much to take how to take this when to take this additional instructions   warfarin 2 MG tablet Commonly known as: COUMADIN Take 1 tab po Tuesday ,Thursday ,Friday ad Saturday and Sunday and then take 4 mg rest of week What changed:  how much to take how to take this when to take this additional instructions               Durable Medical Equipment  (From admission, onward)           Start  Ordered   05/23/23 0907  For home use only DME high strength lightweight manual wheelchair with seat cushion  Once       Comments: Patient suffers from debiltiy, weakness which impairs their ability to perform daily activities like toileting in the home.  A walker will not resolve  issue with performing activities of daily living. A wheelchair will allow patient to safely perform daily activities.Length of need Lifetime. (THEN ONE OF THESE TWO:) Patient self-propels the wheelchair while engaging in frequent activities such as toileting which cannot be performed in a standard or lightweight wheelchair due to the weight of the chair. Accessories: elevating leg rests (ELRs), wheel locks, extensions and anti-tippers.   05/23/23 0908            Discharge Exam: Filed Weights   05/21/23 0841 05/23/23 0622  Weight: 71.6 kg 74.4 kg   General - Elderly Caucasian obese female, mild respiratory stress HEENT - PERRLA, EOMI, atraumatic head, non tender sinuses. Lung - Clear, diffuse Rales wheezes. Heart - S1, S2 heard, no  murmurs, rubs, trace pedal edema. Abdomen soft, nontender, obese Neuro - Alert, awake and oriented x 3, non focal exam. Skin - Warm and dry.  Condition at discharge: stable  The results of significant diagnostics from this hospitalization (including imaging, microbiology, ancillary and laboratory) are listed below for reference.   Imaging Studies: DG Chest 2 View  Result Date: 05/21/2023 CLINICAL DATA:  Shortness of breath.  COPD. EXAM: CHEST - 2 VIEW COMPARISON:  Chest radiographs 10/19/2022 and 06/28/2021; CT chest 03/10/2021 FINDINGS: Cardiac silhouette is again mildly to moderately enlarged. Mediastinal contours are within normal limits with mild-to-moderate atherosclerotic calcifications within the aortic arch. Flattening of the diaphragms and moderate hyperinflation. Unchanged moderate chronic bilateral interstitial thickening, likely interstitial scarring. No definite acute airspace opacity. No pleural effusion or pneumothorax. Moderate multilevel degenerative disc changes of the lower thoracic spine. Diffuse decreased bone mineralization. IMPRESSION: 1. No acute cardiopulmonary process. 2. Unchanged moderate chronic bilateral interstitial thickening, likely interstitial scarring. 3. Unchanged mild-to-moderate cardiomegaly. Electronically Signed   By: Neita Garnet M.D.   On: 05/21/2023 09:52    Microbiology: Results for orders placed or performed during the hospital encounter of 05/21/23  Resp panel by RT-PCR (RSV, Flu A&B, Covid) Anterior Nasal Swab     Status: None   Collection Time: 05/21/23  2:07 PM   Specimen: Anterior Nasal Swab  Result Value Ref Range Status   SARS Coronavirus 2 by RT PCR NEGATIVE NEGATIVE Final    Comment: (NOTE) SARS-CoV-2 target nucleic acids are NOT DETECTED.  The SARS-CoV-2 RNA is generally detectable in upper respiratory specimens during the acute phase of infection. The lowest concentration of SARS-CoV-2 viral copies this assay can detect is 138  copies/mL. A negative result does not preclude SARS-Cov-2 infection and should not be used as the sole basis for treatment or other patient management decisions. A negative result may occur with  improper specimen collection/handling, submission of specimen other than nasopharyngeal swab, presence of viral mutation(s) within the areas targeted by this assay, and inadequate number of viral copies(<138 copies/mL). A negative result must be combined with clinical observations, patient history, and epidemiological information. The expected result is Negative.  Fact Sheet for Patients:  BloggerCourse.com  Fact Sheet for Healthcare Providers:  SeriousBroker.it  This test is no t yet approved or cleared by the [place] FDA and  has been authorized for detection and/or diagnosis of SARS-CoV-2 by FDA under an Emergency Use Authorization (EUA). This  EUA will remain  in effect (meaning this test can be used) for the duration of the COVID-19 declaration under Section 564(b)(1) of the Act, 21 U.S.C.section 360bbb-3(b)(1), unless the authorization is terminated  or revoked sooner.       Influenza A by PCR NEGATIVE NEGATIVE Final   Influenza B by PCR NEGATIVE NEGATIVE Final    Comment: (NOTE) The Xpert Xpress SARS-CoV-2/FLU/RSV plus assay is intended as an aid in the diagnosis of influenza from Nasopharyngeal swab specimens and should not be used as a sole basis for treatment. Nasal washings and aspirates are unacceptable for Xpert Xpress SARS-CoV-2/FLU/RSV testing.  Fact Sheet for Patients: BloggerCourse.com  Fact Sheet for Healthcare Providers: SeriousBroker.it  This test is not yet approved or cleared by the [place] FDA and has been authorized for detection and/or diagnosis of SARS-CoV-2 by FDA under an Emergency Use Authorization (EUA). This EUA will remain in effect (meaning  this test can be used) for the duration of the COVID-19 declaration under Section 564(b)(1) of the Act, 21 U.S.C. section 360bbb-3(b)(1), unless the authorization is terminated or revoked.     Resp Syncytial Virus by PCR NEGATIVE NEGATIVE Final    Comment: (NOTE) Fact Sheet for Patients: BloggerCourse.com  Fact Sheet for Healthcare Providers: SeriousBroker.it  This test is not yet approved or cleared by the [place] FDA and has been authorized for detection and/or diagnosis of SARS-CoV-2 by FDA under an Emergency Use Authorization (EUA). This EUA will remain in effect (meaning this test can be used) for the duration of the COVID-19 declaration under Section 564(b)(1) of the Act, 21 U.S.C. section 360bbb-3(b)(1), unless the authorization is terminated or revoked.  Performed at [facility], [address] [place], [place] [address]   Respiratory (~20 pathogens) panel by PCR     Status: None   Collection Time: 05/21/23  5:17 PM   Specimen: Nasopharyngeal Swab; Respiratory  Result Value Ref Range Status   Adenovirus NOT DETECTED NOT DETECTED Final   Coronavirus 229E NOT DETECTED NOT DETECTED Final    Comment: (NOTE) The Coronavirus on the Respiratory Panel, DOES NOT test for the novel  Coronavirus (2019 nCoV)    Coronavirus HKU1 NOT DETECTED NOT DETECTED Final   Coronavirus NL63 NOT DETECTED NOT DETECTED Final   Coronavirus OC43 NOT DETECTED NOT DETECTED Final   Metapneumovirus NOT DETECTED NOT DETECTED Final   Rhinovirus / Enterovirus NOT DETECTED NOT DETECTED Final   Influenza A NOT DETECTED NOT DETECTED Final   Influenza B NOT DETECTED NOT DETECTED Final   Parainfluenza Virus 1 NOT DETECTED NOT DETECTED Final   Parainfluenza Virus 2 NOT DETECTED NOT DETECTED Final   Parainfluenza Virus 3 NOT DETECTED NOT DETECTED Final   Parainfluenza Virus 4 NOT DETECTED NOT DETECTED Final   Respiratory Syncytial Virus NOT  DETECTED NOT DETECTED Final   Bordetella pertussis NOT DETECTED NOT DETECTED Final   Bordetella Parapertussis NOT DETECTED NOT DETECTED Final   Chlamydophila pneumoniae NOT DETECTED NOT DETECTED Final   Mycoplasma pneumoniae NOT DETECTED NOT DETECTED Final    Comment: Performed at [facility], [address], [place], [place] [address]    Labs: CBC: Recent Labs  Lab 05/21/23 [phone] 05/22/23 0452 05/23/23 0445  WBC 10.4 9.5 11.4*  NEUTROABS  --  7.7 9.0*  HGB 11.3* 10.0* 9.5*  HCT 35.3* 30.6* 28.5*  MCV 93.9 91.9 90.5  PLT 246 252 249   Basic Metabolic Panel: Recent Labs  Lab 05/21/23 0843 05/21/23 2215 05/22/23 0452  NA 138  --  135  K 3.1*  --  4.6  CL 101  --  104  CO2 24  --  25  GLUCOSE 154* 480* 90  BUN 34*  --  37*  CREATININE 2.43*  --  2.53*  CALCIUM 8.5*  --  8.5*   Liver Function Tests: Recent Labs  Lab 05/21/23 0843  AST 19  ALT 10  ALKPHOS 77  BILITOT 1.0  PROT 7.4  ALBUMIN 3.1*   CBG: Recent Labs  Lab 05/22/23 1145 05/22/23 1604 05/22/23 2016 05/23/23 0754 05/23/23 1127  GLUCAP 178* 216* 253* 212* 195*    Discharge time spent: greater than 30 minutes.  Signed: Marcelino Duster, MD [facility] 05/23/2023

## 2023-05-23 NOTE — Progress Notes (Signed)
   Progress Note   Date: 05/23/2023  Patient Name: [name]        MRN#: [id number]   Clarification of the diagnosis of pressure ulcer(s):   Pressure injury sacrum stage 1, present on admission (at the time of the admission order)

## 2023-05-23 NOTE — Progress Notes (Signed)
Patient being discharged home. PIV removed. Went over discharge instructions with patient. Patient stated that she understood and all questions were answered. Patient going home via EMS.

## 2023-05-23 NOTE — TOC Transition Note (Signed)
Transition of Care [facility]) - CM/SW Discharge Note   Patient Details  Name: [name] MRN: [id number] Date of Birth: 1940-11-11  Transition of Care [facility]) CM/SW Contact:  Allena Katz, LCSW Phone Number: 05/23/2023, 12:05 PM   Clinical Narrative:   Pt has orders to discharge home. Pt declines need for HH. But does want EMS transport home. Medical neccesity printed to unit. Wheelchair ordered through [employer]. Spoke with [employer]. CSW to call [facility]     Final next level of care: Home/Self Care Barriers to Discharge: Barriers Resolved   Patient Goals and CMS Choice CMS Medicare.gov Compare Post Acute Care list provided to:: Patient Choice offered to / list presented to : Patient  Discharge Placement                  Patient to be transferred to facility by: [facility]      Discharge Plan and Services Additional resources added to the After Visit Summary for                  DME Arranged: Wheelchair manual DME Agency: [employer] Date DME Agency Contacted: 05/23/23 Time DME Agency Contacted: 1200 Representative spoke with at DME Agency: [facility] HH Arranged:  (declined)          Social Determinants of Health (SDOH) Interventions SDOH Screenings   Food Insecurity: No Food Insecurity (05/21/2023)  Housing: Low Risk  (05/21/2023)  Transportation Needs: No Transportation Needs (05/21/2023)  Utilities: Not At Risk (05/21/2023)  Alcohol Screen: Low Risk  (03/14/2022)  Depression (PHQ2-9): Low Risk  (04/09/2023)  Tobacco Use: Medium Risk (05/21/2023)     Readmission Risk Interventions    06/29/2021    8:45 AM 05/27/2021    2:34 PM  Readmission Risk Prevention Plan  Transportation Screening Complete Complete  Medication Review (RN Care Manager) Complete Complete  PCP or Specialist appointment within 3-5 days of discharge Complete Complete  HRI or Home Care Consult Complete Complete  SW Recovery Care/Counseling Consult Complete Complete  Palliative Care Screening Not Applicable  Complete  Skilled Nursing Facility Not Applicable Complete

## 2023-05-23 NOTE — Progress Notes (Signed)
  Progress Note   Date: 05/23/2023  Patient Name: [name]        MRN#: [id number]   Clarification of diagnosis:   morbid obesity

## 2023-05-23 NOTE — Plan of Care (Signed)

## 2023-05-24 ENCOUNTER — Telehealth: Payer: Self-pay

## 2023-05-24 DIAGNOSIS — E1165 Type 2 diabetes mellitus with hyperglycemia: Secondary | ICD-10-CM

## 2023-05-24 DIAGNOSIS — M150 Primary generalized (osteo)arthritis: Secondary | ICD-10-CM

## 2023-05-24 MED ORDER — CYCLOBENZAPRINE HCL 5 MG PO TABS: 5.0000 mg | ORAL_TABLET | Freq: Every day | ORAL | 4 refills | Status: DC

## 2023-05-24 MED ORDER — INSULIN GLARGINE 100 UNIT/ML SOLOSTAR PEN
22.0000 [IU] | PEN_INJECTOR | Freq: Every day | SUBCUTANEOUS | 3 refills | Status: DC
Start: 2023-05-24 — End: 2023-08-24

## 2023-05-24 NOTE — Telephone Encounter (Signed)
Also needs muscle relaxer's.

## 2023-05-29 ENCOUNTER — Ambulatory Visit (INDEPENDENT_AMBULATORY_CARE_PROVIDER_SITE_OTHER): Payer: Medicare HMO | Admitting: Nurse Practitioner

## 2023-05-29 ENCOUNTER — Encounter: Payer: Self-pay | Admitting: Nurse Practitioner

## 2023-05-29 VITALS — BP 111/53 | HR 90 | Temp 98.2°F | Resp 16 | Ht <= 58 in

## 2023-05-29 DIAGNOSIS — M150 Primary generalized (osteo)arthritis: Secondary | ICD-10-CM | POA: Diagnosis not present

## 2023-05-29 DIAGNOSIS — R052 Subacute cough: Secondary | ICD-10-CM

## 2023-05-29 DIAGNOSIS — Z09 Encounter for follow-up examination after completed treatment for conditions other than malignant neoplasm: Secondary | ICD-10-CM

## 2023-05-29 DIAGNOSIS — J441 Chronic obstructive pulmonary disease with (acute) exacerbation: Secondary | ICD-10-CM

## 2023-05-29 MED ORDER — METHOCARBAMOL 500 MG PO TABS
500.0000 mg | ORAL_TABLET | Freq: Two times a day (BID) | ORAL | 5 refills | Status: DC
Start: 2023-05-29 — End: 2023-08-24

## 2023-05-29 MED ORDER — PROMETHAZINE-DM 6.25-15 MG/5ML PO SYRP
5.0000 mL | ORAL_SOLUTION | Freq: Four times a day (QID) | ORAL | 0 refills | Status: DC | PRN
Start: 2023-05-29 — End: 2023-07-10

## 2023-05-29 NOTE — Progress Notes (Signed)
[facility] [place], [place] [address] [place] [address] [id number]                                   Transitional Care Clinic   [facility] Discharge Acute Issues Care Follow Up                                                                        Patient Demographics  [name], is a 82 y.o. female  DOB [birth year]  MRN [id number].  Primary MD  Sallyanne Kuster, NP  Admit date:     05/21/2023  Discharge date: 05/23/23   Reason for TCC follow Up - COPD with acute exacerbation   Past Medical History:  Diagnosis Date   Anemia    Atrial fibrillation (HCC)    Chronic kidney disease    Congestive heart failure (CHF) (HCC)    COPD (chronic obstructive pulmonary disease) (HCC)    Diabetes mellitus without complication (HCC)    GERD (gastroesophageal reflux disease)    Hyperlipidemia    Hypertension    Pneumonia    Pulmonary fibrosis (HCC)     Past Surgical History:  Procedure Laterality Date   ABDOMINAL HYSTERECTOMY     APPENDECTOMY     CATARACT EXTRACTION     CHOLECYSTECTOMY     HERNIA REPAIR     right knee replacement     right nephroectomy         Recent HPI and Hospital Course  Hospital Course: [name] is a 82 y.o. female with medical history significant of chronic combined systolic and diastolic heart failure, COPD, pulmonary fibrosis, type 2 diabetes, CKD stage IV, atrial fibrillation on Coumadin, hypertension, hyperlipidemia, GERD who presents to the ED due to shortness of breath. She is admitted to hospitalist service for further management.  Patient responded well to the bronchodilators, steroid therapy.  Pro-Cal negative antibiotics stopped.  Patient's home dose Lasix resumed.  Symptoms much improved, did work with physical therapy who advised wheelchair for ambulation.  Patient remains hemodynamically stable to be discharged home.  Advised her to follow-up with PCP, pulmonary upon discharge as instructed.  She  understands and agrees with discharge plan.   Assessment and Plan: * COPD with acute exacerbation (HCC) Prednisone 20 mg oral for 3 days. Continue home inhalers, albuterol as needed.   Chronic combined systolic and diastolic heart failure (HCC) Continue home Lasix. Fluid, salt restriction advised. Monitor daily weights, strict input and output. Recommend outpatient follow-up with cardiology   Atrial fibrillation with RVR (HCC) Continue telemetry monitoring. Heart rate stable. Continue home dose metoprolol 12.5 mg twice daily Patient was restarted on Coumadin therapy.   Pulmonary fibrosis [facility]) Outpatient pulmonary follow-up suggested.   Diabetes (HCC) Patient's home insulin and antiglycemic agents resumed. Advised to keep a log of blood sugars for PCP to adjust medications.   CKD stage 4 due to type 2 diabetes mellitus (HCC) Renal function at baseline Monitor daily BMP.   Long term (current) use of anticoagulants Continue home dose Coumadin therapy   Dementia without behavioral disturbance (HCC) Depression Continue Celexa, Aricept.   PT OT advised wheelchair, DME  orders placed.  Post Hospital Acute Care Issue to be followed in the Clinic    COPD with acute exacerbation [facility]) Active Problems:   Chronic combined systolic and diastolic heart failure (HCC)   Atrial fibrillation with RVR (HCC)   Pulmonary fibrosis (HCC)   Diabetes (HCC)   CKD stage 4 due to type 2 diabetes mellitus (HCC)   Essential hypertension   Long term (current) use of anticoagulants   Dementia without behavioral disturbance (HCC)   COPD exacerbation (HCC)   Pressure injury of skin   Subjective:   [name] today has, No headache, No chest pain, No abdominal pain - No Nausea, No new weakness tingling or numbness, No Cough - SOB. Still having cough.   Assessment & Plan   1. Hospital discharge follow-up Treated for COPD exacerbation in hospital  2. COPD with acute exacerbation  (HCC) Treated with prednisone, inhalers and breathing treatment. Stabilized at this time.   3. Subacute cough Still having cough, cough syrup prescribed to help with symptoms.  - promethazine-dextromethorphan (PROMETHAZINE-DM) 6.25-15 MG/5ML syrup; Take 5 mLs by mouth 4 (four) times daily as needed for cough.  Dispense: 118 mL; Refill: 0  4. Primary generalized (osteo)arthritis May restart day time muscle relaxant.  - methocarbamol (ROBAXIN) 500 MG tablet; Take 1 tablet (500 mg total) by mouth in the morning and at bedtime.  Dispense: 60 tablet; Refill: 5    Reason for frequent admissions/ER visits    COPD AFIB Heart failure Diabetes CKD Hypertension dementia   Objective:   Vitals:   05/29/23 1527  BP: (!) 111/53  Pulse: 90  Resp: 16  Temp: 98.2 F (36.8 C)  SpO2: 96%  Height: 4\' 5"  (1.346 m)    Wt Readings from Last 3 Encounters:  05/23/23 164 lb 0.4 oz (74.4 kg)  05/01/23 157 lb 12.8 oz (71.6 kg)  04/09/23 156 lb (70.8 kg)    Allergies as of 05/29/2023   No Known Allergies      Medication List        Accurate as of May 29, 2023 11:59 PM. If you have any questions, ask your nurse or doctor.          albuterol 108 (90 Base) MCG/ACT inhaler Commonly known as: VENTOLIN HFA Inhale 2 puffs into the lungs every 6 (six) hours as needed for wheezing or shortness of breath.   amLODipine 5 MG tablet Commonly known as: NORVASC Take 1 tablet (5 mg total) by mouth daily.   atorvastatin 10 MG tablet Commonly known as: LIPITOR Take 1 tablet (10 mg total) by mouth every evening.   citalopram 20 MG tablet Commonly known as: CELEXA Take 1 tablet (20 mg total) by mouth daily.   cyanocobalamin 1000 MCG tablet Take 1 tablet (1,000 mcg total) by mouth daily.   cyclobenzaprine 5 MG tablet Commonly known as: FLEXERIL Take 1 tablet (5 mg total) by mouth at bedtime.   donepezil 5 MG tablet Commonly known as: ARICEPT Take 1 tablet (5 mg total) by mouth every  morning.   ferrous sulfate 325 (65 FE) MG tablet Take 1 tablet (325 mg total) by mouth daily with breakfast. What changed: when to take this   guaiFENesin 100 MG/5ML liquid Commonly known as: ROBITUSSIN Take 5 mLs by mouth every 4 (four) hours as needed for cough or to loosen phlegm.   insulin glargine 100 UNIT/ML Solostar Pen Commonly known as: LANTUS Inject 22-24 Units into the skin daily.   linagliptin 5 MG Tabs tablet  Commonly known as: Tradjenta Take 1 tablet (5 mg total) by mouth daily.   Linzess 145 MCG Caps capsule Generic drug: linaclotide Take 1 capsule by mouth at bedtime   melatonin 5 MG Tabs Take 5 mg by mouth at bedtime as needed (sleep).   methocarbamol 500 MG tablet Commonly known as: ROBAXIN Take 1 tablet (500 mg total) by mouth in the morning and at bedtime. Started by: Sallyanne Kuster   montelukast 10 MG tablet Commonly known as: SINGULAIR TAKE 1 TABLET BY MOUTH AT BEDTIME   ondansetron 4 MG disintegrating tablet Commonly known as: ZOFRAN-ODT Take 1 tablet (4 mg total) by mouth every 6 (six) hours as needed for nausea or vomiting.   oxyCODONE-acetaminophen 5-325 MG tablet Commonly known as: PERCOCET/ROXICET Take 1 tablet by mouth 2 (two) times daily as needed for severe pain.   oxyCODONE-acetaminophen 5-325 MG tablet Commonly known as: PERCOCET/ROXICET Take 1 tablet by mouth 2 (two) times daily as needed for severe pain.   pantoprazole 40 MG tablet Commonly known as: PROTONIX Take 1 tablet (40 mg total) by mouth 2 (two) times daily.   Pen Needles 3/16" 31G X 5 MM Misc 1 applicator by Does not apply route 4 (four) times daily.   promethazine-dextromethorphan 6.25-15 MG/5ML syrup Commonly known as: PROMETHAZINE-DM Take 5 mLs by mouth 4 (four) times daily as needed for cough. Started by: Sallyanne Kuster   rOPINIRole 0.5 MG tablet Commonly known as: REQUIP Take 1 tablet (0.5 mg total) by mouth every evening.   torsemide 20 MG  tablet Commonly known as: DEMADEX Take 2.5 tablets (50 mg total) by mouth daily.   traMADol 50 MG tablet Commonly known as: ULTRAM Take one tab po bid prn for pain What changed:  how much to take how to take this when to take this reasons to take this   warfarin 4 MG tablet Commonly known as: COUMADIN TAKE 1 TABLET BY MOUTH ON MONDAY, [facility] AND FRIDAY What changed:  how much to take how to take this when to take this additional instructions   warfarin 2 MG tablet Commonly known as: COUMADIN Take 1 tab po Tuesday ,Thursday ,Friday ad Saturday and Sunday and then take 4 mg rest of week What changed:  how much to take how to take this when to take this additional instructions         Physical Exam: Constitutional: Patient appears well-developed and well-nourished. Not in obvious distress. HENT: Normocephalic, atraumatic, External right and left ear normal. Oropharynx is clear and moist.  Eyes: Conjunctivae and EOM are normal. PERRLA, no scleral icterus. Neck: Normal ROM. Neck supple. No JVD. No tracheal deviation. No thyromegaly. CVS: RRR, S1/S2 +, no murmurs, no gallops, no carotid bruit.  Pulmonary: Effort and breath sounds normal, no stridor, rhonchi, wheezes, rales.  Abdominal: Soft. BS +, no distension, tenderness, rebound or guarding.  Musculoskeletal: Normal range of motion. No edema and no tenderness.  Lymphadenopathy: No lymphadenopathy noted, cervical, inguinal or axillary Neuro: Alert. Normal reflexes, muscle tone coordination. No cranial nerve deficit. Skin: Skin is warm and dry. No rash noted. Not diaphoretic. No erythema. No pallor. Psychiatric: Normal mood and affect. Behavior, judgment, thought content normal.   Data Review   Micro Results No results found for this or any previous visit (from the past 240 hour(s)).    CBC No results for input(s): "WBC", "HGB", "HCT", "PLT", "MCV", "MCH", "MCHC", "RDW", "LYMPHSABS", "MONOABS", "EOSABS",  "BASOSABS", "BANDABS" in the last 168 hours.  Invalid input(s): "NEUTRABS", "BANDSABD"  Chemistries  No results for input(s): "NA", "K", "CL", "CO2", "GLUCOSE", "BUN", "CREATININE", "CALCIUM", "MG", "AST", "ALT", "ALKPHOS", "BILITOT" in the last 168 hours.  Invalid input(s): "GFRCGP" ------------------------------------------------------------------------------------------------------------------ CrCl cannot be calculated (Unknown ideal weight.). ------------------------------------------------------------------------------------------------------------------ No results for input(s): "HGBA1C" in the last 72 hours. ------------------------------------------------------------------------------------------------------------------ No results for input(s): "CHOL", "HDL", "LDLCALC", "TRIG", "CHOLHDL", "LDLDIRECT" in the last 72 hours. ------------------------------------------------------------------------------------------------------------------ No results for input(s): "TSH", "T4TOTAL", "T3FREE", "THYROIDAB" in the last 72 hours.  Invalid input(s): "FREET3" ------------------------------------------------------------------------------------------------------------------ No results for input(s): "VITAMINB12", "FOLATE", "FERRITIN", "TIBC", "IRON", "RETICCTPCT" in the last 72 hours.  Coagulation profile No results for input(s): "INR", "PROTIME" in the last 168 hours.   No results for input(s): "DDIMER" in the last 72 hours.  Cardiac Enzymes No results for input(s): "CKMB", "TROPONINI", "MYOGLOBIN" in the last 168 hours.  Invalid input(s): "CK" ------------------------------------------------------------------------------------------------------------------ Invalid input(s): "POCBNP"  Return for previously scheduled, F/U, Krystelle Prashad PCP in september. .  Time Spent in minutes  45 Time spent with patient included reviewing progress notes, labs, imaging studies, and discussing plan for follow  up.   This patient was seen by Sallyanne Kuster, FNP-C in collaboration with Dr. Beverely Risen as a part of collaborative care agreement.    Sallyanne Kuster MSN, FNP-C on 05/29/2023 at 4:03 PM   **Disclaimer: This note may have been dictated with voice recognition software. Similar sounding words can inadvertently be transcribed and this note may contain transcription errors which may not have been corrected upon publication of note.**

## 2023-05-30 ENCOUNTER — Telehealth: Payer: Self-pay | Admitting: Nurse Practitioner

## 2023-05-30 NOTE — Telephone Encounter (Signed)
Per daughter's request, letter faxed to [facility] allowing patient to [phone]. Notified daughter, June. Scanned-Toni

## 2023-06-05 ENCOUNTER — Telehealth: Payer: Self-pay

## 2023-06-05 DIAGNOSIS — Z79899 Other long term (current) drug therapy: Secondary | ICD-10-CM

## 2023-06-06 MED ORDER — OXYCODONE-ACETAMINOPHEN 5-325 MG PO TABS
1.0000 | ORAL_TABLET | Freq: Two times a day (BID) | ORAL | 0 refills | Status: DC | PRN
Start: 2023-06-06 — End: 2023-08-02

## 2023-06-06 MED ORDER — OXYCODONE-ACETAMINOPHEN 5-325 MG PO TABS
1.0000 | ORAL_TABLET | Freq: Two times a day (BID) | ORAL | 0 refills | Status: AC | PRN
Start: 2023-06-06 — End: ?

## 2023-06-07 NOTE — Telephone Encounter (Signed)
Pt daughter notified   

## 2023-07-03 ENCOUNTER — Encounter: Payer: Medicare HMO | Attending: Physician Assistant | Admitting: Physician Assistant

## 2023-07-03 DIAGNOSIS — E11622 Type 2 diabetes mellitus with other skin ulcer: Secondary | ICD-10-CM | POA: Insufficient documentation

## 2023-07-03 DIAGNOSIS — I130 Hypertensive heart and chronic kidney disease with heart failure and stage 1 through stage 4 chronic kidney disease, or unspecified chronic kidney disease: Secondary | ICD-10-CM | POA: Insufficient documentation

## 2023-07-03 DIAGNOSIS — E1122 Type 2 diabetes mellitus with diabetic chronic kidney disease: Secondary | ICD-10-CM | POA: Insufficient documentation

## 2023-07-03 DIAGNOSIS — I480 Paroxysmal atrial fibrillation: Secondary | ICD-10-CM | POA: Diagnosis not present

## 2023-07-03 DIAGNOSIS — J441 Chronic obstructive pulmonary disease with (acute) exacerbation: Secondary | ICD-10-CM | POA: Insufficient documentation

## 2023-07-03 DIAGNOSIS — Z794 Long term (current) use of insulin: Secondary | ICD-10-CM | POA: Diagnosis not present

## 2023-07-03 DIAGNOSIS — N184 Chronic kidney disease, stage 4 (severe): Secondary | ICD-10-CM | POA: Insufficient documentation

## 2023-07-03 DIAGNOSIS — E1151 Type 2 diabetes mellitus with diabetic peripheral angiopathy without gangrene: Secondary | ICD-10-CM | POA: Insufficient documentation

## 2023-07-03 DIAGNOSIS — H548 Legal blindness, as defined in USA: Secondary | ICD-10-CM | POA: Diagnosis not present

## 2023-07-03 DIAGNOSIS — I5042 Chronic combined systolic (congestive) and diastolic (congestive) heart failure: Secondary | ICD-10-CM | POA: Diagnosis not present

## 2023-07-03 DIAGNOSIS — Z7901 Long term (current) use of anticoagulants: Secondary | ICD-10-CM | POA: Insufficient documentation

## 2023-07-03 DIAGNOSIS — G4730 Sleep apnea, unspecified: Secondary | ICD-10-CM | POA: Diagnosis not present

## 2023-07-03 DIAGNOSIS — S30811A Abrasion of abdominal wall, initial encounter: Secondary | ICD-10-CM | POA: Diagnosis not present

## 2023-07-03 DIAGNOSIS — L89899 Pressure ulcer of other site, unspecified stage: Secondary | ICD-10-CM | POA: Diagnosis not present

## 2023-07-03 NOTE — Progress Notes (Signed)
LICET, EBNET [name] ([id number]) [id number] Page 1 of 8 Visit Report for 07/03/2023 Arrival Information Details Patient Name: Date of Service: [name], [name]. 07/03/2023 1:00 PM Medical Record Number: [id number] Patient Account Number: [id number] Date of Birth/Sex: Treating RN: [birth year] (82 y.o. [name] Primary Care [name]: [name] Other Clinician: Referring [name]: Treating [name]/Extender: [name] Weeks in Treatment: 14 Visit Information History Since Last Visit Added or deleted any medications: No Patient Arrived: [name] Any new allergies or adverse reactions: No Arrival Time: 12:50 Had [name] fall or experienced change in No Accompanied By: daughter activities of daily living that may affect Transfer Assistance: None risk of falls: Patient Identification Verified: Yes Hospitalized since last visit: No Secondary Verification Process Completed: Yes Has Dressing in Place as Prescribed: Yes Patient Requires Transmission-Based Precautions: No Pain Present Now: No Patient Has Alerts: Yes Patient Alerts: Patient on Blood Thinner Diabetes type 2 Coumadin Electronic Signature(s) Signed: 07/03/2023 3:35:44 PM By: [name] Entered By: [name] on 07/03/2023 09:55:14 -------------------------------------------------------------------------------- Clinic Level of Care Assessment Details Patient Name: Date of Service: [name], [name]. 07/03/2023 1:00 PM Medical Record Number: [id number] Patient Account Number: [id number] Date of Birth/Sex: Treating RN: [name] (82 y.o. [name] Primary Care [name]: [name] Other Clinician: Referring [name]: Treating [name]/Extender: [name] Weeks in Treatment: 14 Clinic Level of Care Assessment Items TOOL 4 Quantity Score []  - 0 Use when only an EandM is performed on FOLLOW-UP visit ASSESSMENTS - Nursing  Assessment / Reassessment X- 1 10 Reassessment of Co-morbidities (includes updates in patient status) X- 1 5 Reassessment of Adherence to Treatment Plan ASSESSMENTS - Wound and Skin [name] ssessment / Reassessment X - Simple Wound Assessment / Reassessment - one wound 1 5 Cremeans, [name] ([id number]) [id number] Page 2 of 8 []  - 0 Complex Wound Assessment / Reassessment - multiple wounds []  - 0 Dermatologic / Skin Assessment (not related to wound area) ASSESSMENTS - Focused Assessment []  - 0 Circumferential Edema Measurements - multi extremities []  - 0 Nutritional Assessment / Counseling / Intervention []  - 0 Lower Extremity Assessment (monofilament, tuning fork, pulses) []  - 0 Peripheral Arterial Disease Assessment (using hand held doppler) ASSESSMENTS - Ostomy and/or Continence Assessment and Care []  - 0 Incontinence Assessment and Management []  - 0 Ostomy Care Assessment and Management (repouching, etc.) PROCESS - Coordination of Care X - Simple Patient / Family Education for ongoing care 1 15 []  - 0 Complex (extensive) Patient / Family Education for ongoing care X- 1 10 Staff obtains Chiropractor, Records, T Results / Process Orders est []  - 0 Staff telephones HHA, Nursing Homes / Clarify orders / etc []  - 0 Routine Transfer to another Facility (non-emergent condition) []  - 0 Routine Hospital Admission (non-emergent condition) []  - 0 New Admissions / Manufacturing engineer / Ordering NPWT Apligraf, etc. , []  - 0 Emergency Hospital Admission (emergent condition) X- 1 10 Simple Discharge Coordination []  - 0 Complex (extensive) Discharge Coordination PROCESS - Special Needs []  - 0 Pediatric / Minor Patient Management []  - 0 Isolation Patient Management []  - 0 Hearing / Language / Visual special needs []  - 0 Assessment of Community assistance (transportation, D/C planning, etc.) []  - 0 Additional assistance / Altered mentation []  -  0 Support Surface(s) Assessment (bed, cushion, seat, etc.) INTERVENTIONS - Wound Cleansing / Measurement X - Simple Wound Cleansing - one wound 1 5 []  - 0 Complex Wound Cleansing - multiple wounds X- 1 5 Wound  Imaging (photographs - any number of wounds) []  - 0 Wound Tracing (instead of photographs) X- 1 5 Simple Wound Measurement - one wound []  - 0 Complex Wound Measurement - multiple wounds INTERVENTIONS - Wound Dressings X - Small Wound Dressing one or multiple wounds 1 10 []  - 0 Medium Wound Dressing one or multiple wounds []  - 0 Large Wound Dressing one or multiple wounds X- 1 5 Application of Medications - topical []  - 0 Application of Medications - injection INTERVENTIONS - Miscellaneous []  - 0 External ear exam []  - 0 Specimen Collection (cultures, biopsies, blood, body fluids, etc.) [name], [name] ([id number]) [id number] Page 3 of 8 []  - 0 Specimen(s) / Culture(s) sent or taken to Lab for analysis []  - 0 Patient Transfer (multiple staff / Nurse, adult / Similar devices) []  - 0 Simple Staple / Suture removal (25 or less) []  - 0 Complex Staple / Suture removal (26 or more) []  - 0 Hypo / Hyperglycemic Management (close monitor of Blood Glucose) []  - 0 Ankle / Brachial Index (ABI) - do not check if billed separately X- 1 5 Vital Signs Has the patient been seen at the hospital within the last three years: Yes Total Score: 90 Level Of Care: New/Established - Level 3 Electronic Signature(s) Signed: 07/03/2023 3:35:44 PM By: [name] Entered By: [name] on 07/03/2023 10:56:01 -------------------------------------------------------------------------------- Encounter Discharge Information Details Patient Name: Date of Service: [name], [name]. 07/03/2023 1:00 PM Medical Record Number: [id number] Patient Account Number: [id number] Date of Birth/Sex: Treating RN: [birth year] (82 y.o. [name] Primary Care [name]:  [name] Other Clinician: Referring [name]: Treating [name]/Extender: [name] Weeks in Treatment: 14 Encounter Discharge Information Items Discharge Condition: Stable Ambulatory Status: Walker Discharge Destination: Home Transportation: Private Auto Accompanied By: family Schedule Follow-up Appointment: Yes Clinical Summary of Care: Electronic Signature(s) Signed: 07/03/2023 3:35:44 PM By: [name] Entered By: [name] on 07/03/2023 10:56:42 -------------------------------------------------------------------------------- Lower Extremity Assessment Details Patient Name: Date of Service: [name], [place] RY [name]. 07/03/2023 1:00 PM Medical Record Number: [id number] Patient Account Number: [id number] Date of Birth/Sex: Treating RN: [name] (82 y.o. Alya, Friley, Weidman [name] ([id number]) [id number] Page 4 of 8 Primary Care Larz Mark: [name] Other Clinician: Referring Veleka Djordjevic: Treating Paddy Neis/Extender: [name] Weeks in Treatment: 14 Electronic Signature(s) Signed: 07/03/2023 3:35:44 PM By: [name] Entered By: [name] on 07/03/2023 10:00:14 -------------------------------------------------------------------------------- Multi Wound Chart Details Patient Name: Date of Service: [name], [name]. 07/03/2023 1:00 PM Medical Record Number: [id number] Patient Account Number: [id number] Date of Birth/Sex: Treating RN: [birth year], [name] (82 y.o. [name] Primary Care Jermia Rigsby: [name] Other Clinician: Referring Avinash Maltos: Treating Yarianna Varble/Extender: [name] Weeks in Treatment: 14 Vital Signs Height(in): 58 Pulse(bpm): 107 Weight(lbs): 152 Blood Pressure(mmHg): 135/106 Body Mass Index(BMI): 31.8 Temperature(F): 98 Respiratory Rate(breaths/min): 18 [2:Photos:] [N/[name]:N/[name]] Right Abdomen - Lower Quadrant N/[name] N/[name] Wound  Location: Gradually Appeared N/[name] N/[name] Wounding Event: Abrasion N/[name] N/[name] Primary Etiology: Chronic Obstructive Pulmonary N/[name] N/[name] Comorbid History: Disease (COPD), Sleep Apnea, Congestive Heart Failure, Hypertension, Type II Diabetes 04/15/2023 N/[name] N/[name] Date Acquired: 11 N/[name] N/[name] Weeks of Treatment: Open N/[name] N/[name] Wound Status: No N/[name] N/[name] Wound Recurrence: 0.3x1.2x0.1 N/[name] N/[name] Measurements L x W x D (cm) 0.283 N/[name] N/[name] [name] (cm) : rea 0.028 N/[name] N/[name] Volume (cm) : 48.50% N/[name] N/[name] % Reduction in Area: 74.50% N/[name] N/[name] % Reduction in Volume: Full Thickness Without Exposed N/[name] N/[name] Classification: Support Structures Medium N/[name]  N/[name] Exudate Amount: Serosanguineous N/[name] N/[name] Exudate Type: red, brown N/[name] N/[name] Exudate Color: Large (67-100%) N/[name] N/[name] Granulation Amount: Red, Pink N/[name] N/[name] Granulation Quality: None Present (0%) N/[name] N/[name] Necrotic Amount: Fat Layer (Subcutaneous Tissue): Yes N/[name] N/[name] Exposed Structures: Fascia: No Tendon: No Muscle: No Joint: No [name], [name] ([id number]) [id number] Page 5 of 8 Bone: No None N/[name] N/[name] Epithelialization: Treatment Notes Electronic Signature(s) Signed: 07/03/2023 3:35:44 PM By: [name] Previous Signature: 07/03/2023 1:43:41 PM Version By: [name] Entered By: [name] on 07/03/2023 10:50:19 -------------------------------------------------------------------------------- Multi-Disciplinary Care Plan Details Patient Name: Date of Service: [name], [name]. 07/03/2023 1:00 PM Medical Record Number: [id number] Patient Account Number: [id number] Date of Birth/Sex: Treating RN: [birth year][name] (82 y.o. [name] Primary Care Ravin Bendall: [name] Other Clinician: Referring Kareli Hossain: Treating Violia Knopf/Extender: [name] Weeks in Treatment: 14 Active Inactive Electronic Signature(s) Signed: 07/03/2023 1:44:38 PM By: [name] Entered By: [name] on  07/03/2023 10:44:37 -------------------------------------------------------------------------------- Pain Assessment Details Patient Name: Date of Service: [name], [place] RY [name]. 07/03/2023 1:00 PM Medical Record Number: [id number] Patient Account Number: [id number] Date of Birth/Sex: Treating RN: [name]/[birth year] (82 y.o. [name] Primary Care Sadonna Kotara: [name] Other Clinician: Referring Aileen Amore: Treating Shlomie Romig/Extender: [name] Weeks in Treatment: 14 Active Problems Location of Pain Severity and Description of Pain Patient Has Paino No Site Locations Rate the pain. [name], [name] ([id number]) [id number] Page 6 of 8 Rate the pain. Current Pain Level: 0 Pain Management and Medication Current Pain Management: Electronic Signature(s) Signed: 07/03/2023 3:35:44 PM By: [name] Entered By: [name] on 07/03/2023 09:56:41 -------------------------------------------------------------------------------- Patient/Caregiver Education Details Patient Name: Date of Service: [name], [name]. 9/10/2024andnbsp1:00 PM Medical Record Number: [id number] Patient Account Number: [id number] Date of Birth/Gender: Treating RN: [birth year] (82 y.o. [name] Primary Care Physician: [name] Other Clinician: Referring Physician: Treating Physician/Extender: [name] Weeks in Treatment: 14 Education Assessment Education Provided To: Patient Education Topics Provided Wound/Skin Impairment: Handouts: Caring for Your Ulcer Methods: Explain/Verbal Responses: State content correctly Electronic Signature(s) Signed: 07/03/2023 3:35:44 PM By: [name] Entered By: [name] on 07/03/2023 10:44:48 [name], [name] ([id number]) [id number] Page 7 of 8 -------------------------------------------------------------------------------- Wound Assessment  Details Patient Name: Date of Service: [name], [name]. 07/03/2023 1:00 PM Medical Record Number: [id number] Patient Account Number: [id number] Date of Birth/Sex: Treating RN: [birth year] (82 y.o. [name] Primary Care Codylee Patil: [name] Other Clinician: Referring Christobal Morado: Treating Etherine Mackowiak/Extender: [name] Weeks in Treatment: 14 Wound Status Wound Number: 2 Primary Abrasion Etiology: Wound Location: Right Abdomen - Lower Quadrant Wound Open Wounding Event: Gradually Appeared Status: Date Acquired: 04/15/2023 Comorbid Chronic Obstructive Pulmonary Disease (COPD), Sleep Apnea, Weeks Of Treatment: 11 History: Congestive Heart Failure, Hypertension, Type II Diabetes Clustered Wound: No Photos Wound Measurements Length: (cm) 0.3 Width: (cm) 1.2 Depth: (cm) 0.1 Area: (cm) 0.283 Volume: (cm) 0.028 % Reduction in Area: 48.5% % Reduction in Volume: 74.5% Epithelialization: None Tunneling: No Undermining: No Wound Description Classification: Full Thickness Without Exposed Suppor Exudate Amount: Medium Exudate Type: Serosanguineous Exudate Color: red, brown t Structures Foul Odor After Cleansing: No Slough/Fibrino Yes Wound Bed Granulation Amount: Large (67-100%) Exposed Structure Granulation Quality: Red, Pink Fascia Exposed: No Necrotic Amount: None Present (0%) Fat Layer (Subcutaneous Tissue) Exposed: Yes Tendon Exposed: No Muscle Exposed: No Joint Exposed: No Bone Exposed: No Treatment Notes Wound #2 (Abdomen - Lower Quadrant) Wound Laterality: Right Cleanser Soap and Water Discharge  Instruction: Gently cleanse wound with antibacterial soap, rinse and pat dry prior to dressing wounds Peri-Wound Care [name], [name] ([id number]) [id number] Page 8 of 8 Topical Primary Dressing Hydrofera Blue Ready Transfer Foam, 2.5x2.5 (in/in) Discharge Instruction: Apply Hydrofera Blue Ready to wound bed as  directed Secondary Dressing (BORDER) Zetuvit Plus SILICONE BORDER Dressing 4x4 (in/in) Discharge Instruction: Please do not put silicone bordered dressings under wraps. Use non-bordered dressing only. Secured With Compression Wrap Compression Stockings Facilities manager) Signed: 07/03/2023 3:35:44 PM By: [name] Entered By: [name] on 07/03/2023 10:00:05 -------------------------------------------------------------------------------- Vitals Details Patient Name: Date of Service: [name], [name]. 07/03/2023 1:00 PM Medical Record Number: [id number] Patient Account Number: [id number] Date of Birth/Sex: Treating RN: [name][birth year] (82 y.o. [name] Primary Care Jylian Pappalardo: [name] Other Clinician: Referring Dakia Schifano: Treating Quincy Prisco/Extender: [name] Weeks in Treatment: 14 Vital Signs Time Taken: 12:55 Temperature (F): 98 Height (in): 58 Pulse (bpm): 107 Weight (lbs): 152 Respiratory Rate (breaths/min): 18 Body Mass Index (BMI): 31.8 Blood Pressure (mmHg): 135/106 Reference Range: 80 - 120 mg / dl Electronic Signature(s) Signed: 07/03/2023 3:35:44 PM By: [name] Entered By: [name] on 07/03/2023 09:56:32

## 2023-07-03 NOTE — Progress Notes (Signed)
RAYLENA, CORNWALL [name] ([id number]) [id number] Page 1 of 7 Visit Report for 07/03/2023 Chief Complaint Document Details Patient Name: Date of Service: [name], [name]. 07/03/2023 1:00 PM Medical Record Number: [id number] Patient Account Number: [id number] Date of Birth/Sex: Treating RN: 05-30-1941 (82 y.o. [name] Primary Care Provider: Sallyanne Kuster Other Clinician: Referring Provider: Treating Provider/Extender: Weldon Inches Weeks in Treatment: 14 Information Obtained from: Patient Chief Complaint Left gluteal pressure ulcer Electronic Signature(s) Signed: 07/03/2023 1:28:39 PM By: Allen Derry PA-C Entered By: Allen Derry on 07/03/2023 13:28:39 -------------------------------------------------------------------------------- HPI Details Patient Name: Date of Service: Amy Keel, MA RY [name]. 07/03/2023 1:00 PM Medical Record Number: [id number] Patient Account Number: [id number] Date of Birth/Sex: Treating RN: Feb 26, 1941 (82 y.o. [name] Primary Care Provider: Sallyanne Kuster Other Clinician: Referring Provider: Treating Provider/Extender: Weldon Inches Weeks in Treatment: 14 History of Present Illness HPI Description: 03-26-2023 upon evaluation today patient presents for initial evaluation here in the clinic concerning [name] wound over the left gluteal region this is [name] stage II pressure ulcer. Fortunately it does not appear to be too deep at all which is great news and very pleased in that regard. The patient is on Coumadin she also has been using mupirocin topically and has doxycycline of which she has 1 week left. The good news is I think the infection looks to be doing much better. With that being said she does spend most of her time in [name] recliner and I think that is where [name] lot of the pressure issues coming from. Patient does have [name] history of diabetes mellitus type 2, she uses insulin for control, hypertension,  COPD, chronic kidney disease stage IV, congestive heart failure, long-term use of anticoagulant therapy due to atrial fibrillation, this specifically his Coumadin, she is legally blind, and is [name] current smoker. 04-03-2023 upon evaluation today patient appears to be doing well currently in regard to her wound. This is actually showing signs of excellent improvement. Fortunately there does not appear to be any signs of infection locally nor systemically at this time. With that being said the patient does seem to be making some good progress here in regard to left gluteal ulcer. She does have [name] rash on the right gluteal region. This is something that tends to come and go about once [name] year. This has been going on for [name] number of years. 04-09-2023 upon evaluation today patient appears to be doing well currently with regard to her wounds. The area of shingles actually appears to be completely dry and healed up. The area where she has more drainage with regard to the primary wound also appears like it could be healed although not 100% sure on this when I would like to monitor for 1 more week before calling it completely healed. Fortunately I do not see any signs of active infection locally nor systemically which is great news. [name], [name] ([id number]) [id number] Page 2 of 7 04-16-2023 upon evaluation today patient appears to be doing excellent currently in regard to her original wound which is good on the gluteal region this is completely healed. 04-24-2023 upon evaluation today patient appears to be doing well currently in regard to her wound this is actually showing signs of improvement. Fortunately I do not see any evidence of active infection locally or systemically which is great news and in general I do believe that we will making good headway towards complete closure. I do not see any signs of infection the wound  is very clean today. 05-08-2023 upon evaluation patient  appears to be doing about the same in regard to her wound looks cleaner it does not look smaller. We discussed today the possibility of switching over to [name] different dressing I think this may be staying [name] little bit too wet with the Xeroform gauze at this point. She voiced understanding and she is in agreement with that plan. 7/23; this is [name] patient with [name] small wound in her right upper quadrant which is at the tail of [name] large old cholecystectomy scar. Equally problematic is [name] large hernia above this area. Switch to [facility] Blue last week it does not have any depth and appears to have improved in surface area. 07-03-2023 upon evaluation today patient appears to be doing well currently in regard to her wound is actually measuring smaller than last time I saw her although it has been since July when I last saw her she was in the hospital due to COPD exacerbation has been having [name] lot of issues. Nonetheless the wound itself looks to be doing great no signs of infection. Overall I am extremely pleased with where things stand currently. Electronic Signature(s) Signed: 07/03/2023 2:15:22 PM By: Allen Derry PA-C Entered By: Allen Derry on 07/03/2023 14:15:22 -------------------------------------------------------------------------------- Physical Exam Details Patient Name: Date of Service: Amy Keel, MA RY [name]. 07/03/2023 1:00 PM Medical Record Number: [id number] Patient Account Number: [id number] Date of Birth/Sex: Treating RN: 05-Jan-1941 (82 y.o. [name] Primary Care Provider: Sallyanne Kuster Other Clinician: Referring Provider: Treating Provider/Extender: Weldon Inches Weeks in Treatment: 14 Constitutional Well-nourished and well-hydrated in no acute distress. Respiratory normal breathing without difficulty. Neurological cranial nerves 2-12 intact. Patient has normal sensation in the feet bilaterally to light touch. Notes Upon inspection patient's wound bed actually  showed signs of good granulation epithelization at this point. Fortunately I do not see any signs of active infection which is great news and in general I do believe that we are making excellent headway towards complete closure which is awesome. Electronic Signature(s) Signed: 07/03/2023 2:17:50 PM By: Allen Derry PA-C Entered By: Allen Derry on 07/03/2023 14:17:50 Ades, [name] ([id number]) [id number] Page 3 of 7 -------------------------------------------------------------------------------- Physician Orders Details Patient Name: Date of Service: [name], [name]. 07/03/2023 1:00 PM Medical Record Number: [id number] Patient Account Number: [id number] Date of Birth/Sex: Treating RN: 16-Feb-1941 (82 y.o. [name] Primary Care Provider: Sallyanne Kuster Other Clinician: Referring Provider: Treating Provider/Extender: Weldon Inches Weeks in Treatment: 42 Verbal / Phone Orders: No Diagnosis Coding ICD-10 Coding Code Description S31.100D Unspecified open wound of abdominal wall, right upper quadrant without penetration into peritoneal cavity, subsequent encounter E11.622 Type 2 diabetes mellitus with other skin ulcer Z79.4 Long term (current) use of insulin B02.9 Zoster without complications I10 Essential (primary) hypertension J44.9 Chronic obstructive pulmonary disease, unspecified N18.4 Chronic kidney disease, stage 4 (severe) I50.42 Chronic combined systolic (congestive) and diastolic (congestive) heart failure Z79.01 Long term (current) use of anticoagulants I48.0 Paroxysmal atrial fibrillation H54.8 Legal blindness, as defined in [place] F17.218 Nicotine dependence, cigarettes, with other nicotine-induced disorders K45.0 Other specified abdominal hernia with obstruction, without gangrene Follow-up Appointments Return Appointment in 1 week. Bathing/ [employer] wounds with antibacterial soap and water. Anesthetic (Use  'Patient Medications' Section for Anesthetic Order Entry) Lidocaine applied to wound bed Wound Treatment Wound #2 - Abdomen - Lower Quadrant Wound Laterality: Right Cleanser: Soap and Water Every Other Day/30 Days Discharge Instructions: Gently cleanse wound with antibacterial soap,  rinse and pat dry prior to dressing wounds Prim Dressing: Hydrofera Blue Ready Transfer Foam, 2.5x2.5 (in/in) (Generic) Every Other Day/30 Days ary Discharge Instructions: Apply Hydrofera Blue Ready to wound bed as directed Secondary Dressing: (BORDER) Zetuvit Plus SILICONE BORDER Dressing 4x4 (in/in) (Generic) Every Other Day/30 Days Discharge Instructions: Please do not put silicone bordered dressings under wraps. Use non-bordered dressing only. Electronic Signature(s) Unsigned Entered By: Angelina Pih on 07/03/2023 13:55:38 Problem List Details -------------------------------------------------------------------------------- [name] ([id number]) [id number] Page 4 of 7 Patient Name: Date of Service: [name], [name]. 07/03/2023 1:00 PM Medical Record Number: [id number] Patient Account Number: [id number] Date of Birth/Sex: Treating RN: October 13, 1941 (82 y.o. [name] Primary Care Provider: Sallyanne Kuster Other Clinician: Referring Provider: Treating Provider/Extender: Weldon Inches Weeks in Treatment: 14 Active Problems ICD-10 Encounter Code Description Active Date MDM Diagnosis S31.100D Unspecified open wound of abdominal wall, right upper quadrant without 05/15/2023 No Yes penetration into peritoneal cavity, subsequent encounter E11.622 Type 2 diabetes mellitus with other skin ulcer 03/26/2023 No Yes Z79.4 Long term (current) use of insulin 03/26/2023 No Yes B02.9 Zoster without complications 04/03/2023 No Yes I10 Essential (primary) hypertension 03/26/2023 No Yes J44.9 Chronic obstructive pulmonary disease, unspecified 03/26/2023 No Yes N18.4  Chronic kidney disease, stage 4 (severe) 03/26/2023 No Yes I50.42 Chronic combined systolic (congestive) and diastolic (congestive) heart failure 03/26/2023 No Yes Z79.01 Long term (current) use of anticoagulants 03/26/2023 No Yes I48.0 Paroxysmal atrial fibrillation 03/26/2023 No Yes H54.8 Legal blindness, as defined in [place] 03/26/2023 No Yes F17.218 Nicotine dependence, cigarettes, with other nicotine-induced disorders 03/26/2023 No Yes K45.0 Other specified abdominal hernia with obstruction, without gangrene 05/15/2023 No Yes Inactive Problems ICD-10 Code Description Active Date Inactive Date L89.322 Pressure ulcer of left buttock, stage 2 03/26/2023 03/26/2023 Resolved Problems [name], [name] ([id number]) [id number] Page 5 of 7 Electronic Signature(s) Signed: 07/03/2023 1:28:24 PM By: Allen Derry PA-C Entered By: Allen Derry on 07/03/2023 13:28:23 -------------------------------------------------------------------------------- Progress Note Details Patient Name: Date of Service: Amy Keel, MA RY [name]. 07/03/2023 1:00 PM Medical Record Number: [id number] Patient Account Number: [id number] Date of Birth/Sex: Treating RN: 1941-10-14 (82 y.o. [name] Primary Care Provider: Sallyanne Kuster Other Clinician: Referring Provider: Treating Provider/Extender: Weldon Inches Weeks in Treatment: 14 Subjective Chief Complaint Information obtained from Patient Left gluteal pressure ulcer History of Present Illness (HPI) 03-26-2023 upon evaluation today patient presents for initial evaluation here in the clinic concerning [name] wound over the left gluteal region this is [name] stage II pressure ulcer. Fortunately it does not appear to be too deep at all which is great news and very pleased in that regard. The patient is on Coumadin she also has been using mupirocin topically and has doxycycline of which she has 1 week left. The good news is I think the infection looks to be  doing much better. With that being said she does spend most of her time in [name] recliner and I think that is where [name] lot of the pressure issues coming from. Patient does have [name] history of diabetes mellitus type 2, she uses insulin for control, hypertension, COPD, chronic kidney disease stage IV, congestive heart failure, long-term use of anticoagulant therapy due to atrial fibrillation, this specifically his Coumadin, she is legally blind, and is [name] current smoker. 04-03-2023 upon evaluation today patient appears to be doing well currently in regard to her wound. This is actually showing signs of excellent improvement. Fortunately there does not appear to be any signs of  infection locally nor systemically at this time. With that being said the patient does seem to be making some good progress here in regard to left gluteal ulcer. She does have [name] rash on the right gluteal region. This is something that tends to come and go about once [name] year. This has been going on for [name] number of years. 04-09-2023 upon evaluation today patient appears to be doing well currently with regard to her wounds. The area of shingles actually appears to be completely dry and healed up. The area where she has more drainage with regard to the primary wound also appears like it could be healed although not 100% sure on this when I would like to monitor for 1 more week before calling it completely healed. Fortunately I do not see any signs of active infection locally nor systemically which is great news. 04-16-2023 upon evaluation today patient appears to be doing excellent currently in regard to her original wound which is good on the gluteal region this is completely healed. 04-24-2023 upon evaluation today patient appears to be doing well currently in regard to her wound this is actually showing signs of improvement. Fortunately I do not see any evidence of active infection locally or systemically which is great news and in general I do  believe that we will making good headway towards complete closure. I do not see any signs of infection the wound is very clean today. 05-08-2023 upon evaluation patient appears to be doing about the same in regard to her wound looks cleaner it does not look smaller. We discussed today the possibility of switching over to [name] different dressing I think this may be staying [name] little bit too wet with the Xeroform gauze at this point. She voiced understanding and she is in agreement with that plan. 7/23; this is [name] patient with [name] small wound in her right upper quadrant which is at the tail of [name] large old cholecystectomy scar. Equally problematic is [name] large hernia above this area. Switch to [facility] Blue last week it does not have any depth and appears to have improved in surface area. 07-03-2023 upon evaluation today patient appears to be doing well currently in regard to her wound is actually measuring smaller than last time I saw her although it has been since July when I last saw her she was in the hospital due to COPD exacerbation has been having [name] lot of issues. Nonetheless the wound itself looks to be doing great no signs of infection. Overall I am extremely pleased with where things stand currently. Objective Constitutional [name], [name] ([id number]) [id number] Page 6 of 7 Well-nourished and well-hydrated in no acute distress. Vitals Time Taken: 12:55 PM, Height: 58 in, Weight: 152 lbs, BMI: 31.8, Temperature: 98 F, Pulse: 107 bpm, Respiratory Rate: 18 breaths/min, Blood Pressure: 135/106 mmHg. Respiratory normal breathing without difficulty. Neurological cranial nerves 2-12 intact. Patient has normal sensation in the feet bilaterally to light touch. General Notes: Upon inspection patient's wound bed actually showed signs of good granulation epithelization at this point. Fortunately I do not see any signs of active infection which is great news and in general I do  believe that we are making excellent headway towards complete closure which is awesome. Integumentary (Hair, Skin) Wound #2 status is Open. Original cause of wound was Gradually Appeared. The date acquired was: 04/15/2023. The wound has been in treatment 11 weeks. The wound is located on the Right Abdomen - Lower Quadrant. The wound measures 0.3cm length  x 1.2cm width x 0.1cm depth; 0.283cm^2 area and 0.028cm^3 volume. There is Fat Layer (Subcutaneous Tissue) exposed. There is no tunneling or undermining noted. There is [name] medium amount of serosanguineous drainage noted. There is large (67-100%) red, pink granulation within the wound bed. There is no necrotic tissue within the wound bed. Assessment Active Problems ICD-10 Unspecified open wound of abdominal wall, right upper quadrant without penetration into peritoneal cavity, subsequent encounter Type 2 diabetes mellitus with other skin ulcer Long term (current) use of insulin Zoster without complications Essential (primary) hypertension Chronic obstructive pulmonary disease, unspecified Chronic kidney disease, stage 4 (severe) Chronic combined systolic (congestive) and diastolic (congestive) heart failure Long term (current) use of anticoagulants Paroxysmal atrial fibrillation Legal blindness, as defined in [place] Nicotine dependence, cigarettes, with other nicotine-induced disorders Other specified abdominal hernia with obstruction, without gangrene Plan Follow-up Appointments: Return Appointment in 1 week. Bathing/ Shower/ Hygiene: Wash wounds with antibacterial soap and water. Anesthetic (Use 'Patient Medications' Section for Anesthetic Order Entry): Lidocaine applied to wound bed WOUND #2: - Abdomen - Lower Quadrant Wound Laterality: Right Cleanser: Soap and Water Every Other Day/30 Days Discharge Instructions: Gently cleanse wound with antibacterial soap, rinse and pat dry prior to dressing wounds Prim Dressing: Hydrofera Blue Ready  Transfer Foam, 2.5x2.5 (in/in) (Generic) Every Other Day/30 Days ary Discharge Instructions: Apply Hydrofera Blue Ready to wound bed as directed Secondary Dressing: (BORDER) Zetuvit Plus SILICONE BORDER Dressing 4x4 (in/in) (Generic) Every Other Day/30 Days Discharge Instructions: Please do not put silicone bordered dressings under wraps. Use non-bordered dressing only. 1. I am recommend that we have the patient continue to monitor for any signs of active infection or worsening overall. I do believe that the patient is making good headway towards complete closure which is great news. I do not see any signs of worsening overall. With that being said this is an ongoing wound however and she has been having [name] lot of other issues with her COPD which I think is complicated the situation [name] bit. 2. I am going to recommend as well the patient should use again the [facility] followed by bordered foam dressing. We will see patient back for reevaluation in 1 week here in the clinic. If anything worsens or changes patient will contact our office for additional recommendations. Electronic Signature(s) Signed: 07/03/2023 2:18:28 PM By: Allen Derry PA-C Entered By: Allen Derry on 07/03/2023 14:18:27 [name], [name] ([id number]) [id number] Page 7 of 7 -------------------------------------------------------------------------------- SuperBill Details Patient Name: Date of Service: [name], [name]. 07/03/2023 Medical Record Number: [id number] Patient Account Number: [id number] Date of Birth/Sex: Treating RN: 03/01/1941 (82 y.o. [name] Primary Care Provider: Sallyanne Kuster Other Clinician: Referring Provider: Treating Provider/Extender: Weldon Inches Weeks in Treatment: 14 Diagnosis Coding ICD-10 Codes Code Description S31.100D Unspecified open wound of abdominal wall, right upper quadrant without penetration into peritoneal cavity, subsequent  encounter E11.622 Type 2 diabetes mellitus with other skin ulcer Z79.4 Long term (current) use of insulin B02.9 Zoster without complications I10 Essential (primary) hypertension J44.9 Chronic obstructive pulmonary disease, unspecified N18.4 Chronic kidney disease, stage 4 (severe) I50.42 Chronic combined systolic (congestive) and diastolic (congestive) heart failure Z79.01 Long term (current) use of anticoagulants I48.0 Paroxysmal atrial fibrillation H54.8 Legal blindness, as defined in [place] F17.218 Nicotine dependence, cigarettes, with other nicotine-induced disorders K45.0 Other specified abdominal hernia with obstruction, without gangrene Facility Procedures : CPT4 Code: [id number] Description: 99213 - WOUND CARE VISIT-LEV 3 EST PT Modifier: Quantity: 1 Physician Procedures : YQI3  Code Description Modifier [id number] - WC PHYS LEVEL 3 - EST PT ICD-10 Diagnosis Description S31.100D Unspecified open wound of abdominal wall, right upper quadrant without penetration into peritoneal cavi subsequent encounter E11.622 Type 2  diabetes mellitus with other skin ulcer Z79.4 Long term (current) use of insulin B02.9 Zoster without complications Quantity: 1 ty, Electronic Signature(s) Signed: 07/03/2023 2:18:48 PM By: Allen Derry PA-C Entered By: Allen Derry on 07/03/2023 14:18:47

## 2023-07-10 ENCOUNTER — Encounter: Payer: Medicare HMO | Admitting: Physician Assistant

## 2023-07-10 ENCOUNTER — Telehealth: Payer: Self-pay | Admitting: Nurse Practitioner

## 2023-07-10 ENCOUNTER — Ambulatory Visit (INDEPENDENT_AMBULATORY_CARE_PROVIDER_SITE_OTHER): Payer: Medicare HMO | Admitting: Nurse Practitioner

## 2023-07-10 VITALS — BP 117/64 | HR 112 | Temp 98.3°F | Resp 16 | Ht <= 58 in | Wt 162.8 lb

## 2023-07-10 DIAGNOSIS — Z7901 Long term (current) use of anticoagulants: Secondary | ICD-10-CM | POA: Diagnosis not present

## 2023-07-10 DIAGNOSIS — Z760 Encounter for issue of repeat prescription: Secondary | ICD-10-CM

## 2023-07-10 DIAGNOSIS — N184 Chronic kidney disease, stage 4 (severe): Secondary | ICD-10-CM | POA: Diagnosis not present

## 2023-07-10 DIAGNOSIS — R1011 Right upper quadrant pain: Secondary | ICD-10-CM | POA: Diagnosis not present

## 2023-07-10 DIAGNOSIS — E1165 Type 2 diabetes mellitus with hyperglycemia: Secondary | ICD-10-CM | POA: Diagnosis not present

## 2023-07-10 DIAGNOSIS — K5901 Slow transit constipation: Secondary | ICD-10-CM | POA: Diagnosis not present

## 2023-07-10 DIAGNOSIS — I130 Hypertensive heart and chronic kidney disease with heart failure and stage 1 through stage 4 chronic kidney disease, or unspecified chronic kidney disease: Secondary | ICD-10-CM | POA: Diagnosis not present

## 2023-07-10 DIAGNOSIS — Z794 Long term (current) use of insulin: Secondary | ICD-10-CM

## 2023-07-10 DIAGNOSIS — E1122 Type 2 diabetes mellitus with diabetic chronic kidney disease: Secondary | ICD-10-CM | POA: Diagnosis not present

## 2023-07-10 DIAGNOSIS — Z79899 Other long term (current) drug therapy: Secondary | ICD-10-CM | POA: Diagnosis not present

## 2023-07-10 DIAGNOSIS — E1169 Type 2 diabetes mellitus with other specified complication: Secondary | ICD-10-CM

## 2023-07-10 DIAGNOSIS — K439 Ventral hernia without obstruction or gangrene: Secondary | ICD-10-CM | POA: Diagnosis not present

## 2023-07-10 DIAGNOSIS — I5042 Chronic combined systolic (congestive) and diastolic (congestive) heart failure: Secondary | ICD-10-CM | POA: Diagnosis not present

## 2023-07-10 DIAGNOSIS — S31109A Unspecified open wound of abdominal wall, unspecified quadrant without penetration into peritoneal cavity, initial encounter: Secondary | ICD-10-CM | POA: Diagnosis not present

## 2023-07-10 DIAGNOSIS — E1151 Type 2 diabetes mellitus with diabetic peripheral angiopathy without gangrene: Secondary | ICD-10-CM | POA: Diagnosis not present

## 2023-07-10 DIAGNOSIS — R052 Subacute cough: Secondary | ICD-10-CM | POA: Diagnosis not present

## 2023-07-10 DIAGNOSIS — E11622 Type 2 diabetes mellitus with other skin ulcer: Secondary | ICD-10-CM | POA: Diagnosis not present

## 2023-07-10 DIAGNOSIS — L89899 Pressure ulcer of other site, unspecified stage: Secondary | ICD-10-CM | POA: Diagnosis not present

## 2023-07-10 LAB — POCT GLYCOSYLATED HEMOGLOBIN (HGB A1C): Hemoglobin A1C: 7.7 % — AB (ref 4.0–5.6)

## 2023-07-10 MED ORDER — PANTOPRAZOLE SODIUM 40 MG PO TBEC
40.0000 mg | DELAYED_RELEASE_TABLET | Freq: Two times a day (BID) | ORAL | 3 refills | Status: DC
Start: 2023-07-10 — End: 2024-03-04

## 2023-07-10 MED ORDER — LINACLOTIDE 145 MCG PO CAPS
145.0000 ug | ORAL_CAPSULE | Freq: Every day | ORAL | 0 refills | Status: DC
Start: 2023-07-10 — End: 2023-09-25

## 2023-07-10 MED ORDER — MONTELUKAST SODIUM 10 MG PO TABS
10.0000 mg | ORAL_TABLET | Freq: Every day | ORAL | 3 refills | Status: DC
Start: 2023-07-10 — End: 2024-03-04

## 2023-07-10 MED ORDER — PROMETHAZINE-DM 6.25-15 MG/5ML PO SYRP
5.0000 mL | ORAL_SOLUTION | Freq: Four times a day (QID) | ORAL | 0 refills | Status: DC | PRN
Start: 2023-07-10 — End: 2023-09-25

## 2023-07-10 NOTE — Progress Notes (Signed)
[facility] [address] [place], [place] [address]  Internal MEDICINE  Office Visit Note  Patient Name: [name]  [id number]  [id number]  Date of Service: 07/10/2023  Chief Complaint  Patient presents with   Diabetes   Gastroesophageal Reflux   Hypertension   Hyperlipidemia   Follow-up    HPI [place] presents for a follow-up visit for diabetes, right abdominal pain, constipation, cough and refills.  Diabetes -- A1c significantly improved to 7.7 from 13.5  Still having right side abdominal pain and swelling -- need imaging  Subacute cough -- prior covid infection Constipation -- requesting medication Due for refills of medications   Current Medication: Outpatient Encounter Medications as of 07/10/2023  Medication Sig   albuterol (VENTOLIN HFA) 108 (90 Base) MCG/ACT inhaler Inhale 2 puffs into the lungs every 6 (six) hours as needed for wheezing or shortness of breath.   amLODipine (NORVASC) 5 MG tablet Take 1 tablet (5 mg total) by mouth daily.   atorvastatin (LIPITOR) 10 MG tablet Take 1 tablet (10 mg total) by mouth every evening.   citalopram (CELEXA) 20 MG tablet Take 1 tablet (20 mg total) by mouth daily.   cyclobenzaprine (FLEXERIL) 5 MG tablet Take 1 tablet (5 mg total) by mouth at bedtime.   donepezil (ARICEPT) 5 MG tablet Take 1 tablet (5 mg total) by mouth every morning.   ferrous sulfate 325 (65 FE) MG tablet Take 1 tablet (325 mg total) by mouth daily with breakfast. (Patient taking differently: Take 325 mg by mouth every evening.)   guaiFENesin (ROBITUSSIN) 100 MG/5ML liquid Take 5 mLs by mouth every 4 (four) hours as needed for cough or to loosen phlegm.   insulin glargine (LANTUS) 100 UNIT/ML Solostar Pen Inject 22-24 Units into the skin daily.   Insulin Pen Needle (PEN NEEDLES 3/16") 31G X 5 MM MISC 1 applicator by Does not apply route 4 (four) times daily.   linagliptin (TRADJENTA) 5 MG TABS tablet Take 1 tablet (5 mg total) by mouth daily.    melatonin 5 MG TABS Take 5 mg by mouth at bedtime as needed (sleep).   methocarbamol (ROBAXIN) 500 MG tablet Take 1 tablet (500 mg total) by mouth in the morning and at bedtime.   ondansetron (ZOFRAN-ODT) 4 MG disintegrating tablet Take 1 tablet (4 mg total) by mouth every 6 (six) hours as needed for nausea or vomiting.   oxyCODONE-acetaminophen (PERCOCET/ROXICET) 5-325 MG tablet Take 1 tablet by mouth 2 (two) times daily as needed for severe pain.   oxyCODONE-acetaminophen (PERCOCET/ROXICET) 5-325 MG tablet Take 1 tablet by mouth 2 (two) times daily as needed for severe pain.   rOPINIRole (REQUIP) 0.5 MG tablet Take 1 tablet (0.5 mg total) by mouth every evening.   torsemide (DEMADEX) 20 MG tablet Take 2.5 tablets (50 mg total) by mouth daily.   traMADol (ULTRAM) 50 MG tablet Take one tab po bid prn for pain (Patient taking differently: Take 50 mg by mouth 2 (two) times daily as needed for severe pain or moderate pain. Take one tab po bid prn for pain)   vitamin B-12 1000 MCG tablet Take 1 tablet (1,000 mcg total) by mouth daily.   warfarin (COUMADIN) 4 MG tablet TAKE 1 TABLET BY MOUTH ON MONDAY, WEDNESDAY AND FRIDAY (Patient taking differently: Take 4 mg by mouth See admin instructions. TAKE 1 TABLET BY MOUTH ON MONDAY AND WED)   [DISCONTINUED] LINZESS 145 MCG CAPS capsule Take 1 capsule by mouth at bedtime   [DISCONTINUED]  montelukast (SINGULAIR) 10 MG tablet TAKE 1 TABLET BY MOUTH AT BEDTIME   [DISCONTINUED] pantoprazole (PROTONIX) 40 MG tablet Take 1 tablet (40 mg total) by mouth 2 (two) times daily.   [DISCONTINUED] promethazine-dextromethorphan (PROMETHAZINE-DM) 6.25-15 MG/5ML syrup Take 5 mLs by mouth 4 (four) times daily as needed for cough.   linaclotide (LINZESS) 145 MCG CAPS capsule Take 1 capsule (145 mcg total) by mouth at bedtime.   montelukast (SINGULAIR) 10 MG tablet Take 1 tablet (10 mg total) by mouth at bedtime.   pantoprazole (PROTONIX) 40 MG tablet Take 1 tablet (40 mg total) by  mouth 2 (two) times daily.   promethazine-dextromethorphan (PROMETHAZINE-DM) 6.25-15 MG/5ML syrup Take 5 mLs by mouth 4 (four) times daily as needed for cough.   warfarin (COUMADIN) 2 MG tablet Take 1 tab po Tuesday ,Thursday ,Friday ad Saturday and Sunday and then take 4 mg rest of week (Patient taking differently: Take 2 mg by mouth See admin instructions. Take 1 tab po Tuesday,Thursday,Friday,Saturday and Sunday and then take 4 mg on Mon and Wed)   No facility-administered encounter medications on file as of 07/10/2023.    Surgical History: Past Surgical History:  Procedure Laterality Date   ABDOMINAL HYSTERECTOMY     APPENDECTOMY     CATARACT EXTRACTION     CHOLECYSTECTOMY     HERNIA REPAIR     right knee replacement     right nephroectomy      Medical History: Past Medical History:  Diagnosis Date   Anemia    Atrial fibrillation (HCC)    Chronic kidney disease    Congestive heart failure (CHF) (HCC)    COPD (chronic obstructive pulmonary disease) (HCC)    Diabetes mellitus without complication (HCC)    GERD (gastroesophageal reflux disease)    Hyperlipidemia    Hypertension    Pneumonia    Pulmonary fibrosis (HCC)     Family History: Family History  Problem Relation Age of Onset   Diabetes Mother    Breast cancer Mother    Heart disease Father    Diabetes Father    Diabetes Sister    Diabetes Brother    Cancer Brother     Social History   Socioeconomic History   Marital status: Widowed    Spouse name: Not on file   Number of children: Not on file   Years of education: Not on file   Highest education level: Not on file  Occupational History   Occupation: retired  Tobacco Use   Smoking status: Former    Current packs/day: 0.00    Types: Cigarettes    Quit date: 08/19/2022    Years since quitting: 0.8   Smokeless tobacco: Never   Tobacco comments:    1 pack daily---quit 1 month   Vaping Use   Vaping status: Never Used  Substance and Sexual Activity    Alcohol use: No   Drug use: No   Sexual activity: Not Currently  Other Topics Concern   Not on file  Social History Narrative   Lives with daughter   Social Determinants of Health   Financial Resource Strain: Not on file  Food Insecurity: No Food Insecurity (05/21/2023)   Hunger Vital Sign    Worried About Running Out of Food in the Last Year: Never true    Ran Out of Food in the Last Year: Never true  Transportation Needs: No Transportation Needs (05/21/2023)   PRAPARE - Administrator, Civil Service (Medical): No  Lack of Transportation (Non-Medical): No  Physical Activity: Not on file  Stress: Not on file  Social Connections: Not on file  Intimate Partner Violence: Not At Risk (05/21/2023)   Humiliation, Afraid, Rape, and Kick questionnaire    Fear of Current or Ex-Partner: No    Emotionally Abused: No    Physically Abused: No    Sexually Abused: No      Review of Systems  Constitutional:  Positive for fatigue. Negative for activity change, appetite change, chills, fever and unexpected weight change.  HENT: Negative.  Negative for congestion, ear pain, rhinorrhea, sore throat and trouble swallowing.   Eyes: Negative.   Respiratory: Negative.  Negative for cough, chest tightness, shortness of breath and wheezing.   Cardiovascular: Negative.  Negative for chest pain and palpitations.  Gastrointestinal:  Positive for nausea. Negative for abdominal pain, blood in stool, constipation, diarrhea and vomiting.  Endocrine: Negative.   Genitourinary: Negative.  Negative for difficulty urinating, dysuria, frequency, hematuria and urgency.  Musculoskeletal:  Positive for arthralgias and myalgias. Negative for back pain, joint swelling and neck pain.  Skin: Negative.  Negative for rash and wound.  Allergic/Immunologic: Negative.  Negative for immunocompromised state.  Neurological: Negative.  Negative for dizziness, seizures, numbness and headaches.  Hematological:  Negative.   Psychiatric/Behavioral: Negative.  Negative for behavioral problems, self-injury and suicidal ideas. The patient is not nervous/anxious.     Vital Signs: BP 117/64   Pulse (!) 112   Temp 98.3 F (36.8 C)   Resp 16   Ht 4\' 5"  (1.346 m)   Wt 162 lb 12.8 oz (73.8 kg)   SpO2 92%   BMI 40.75 kg/m    Physical Exam Vitals reviewed.  Constitutional:      General: She is not in acute distress.    Appearance: Normal appearance. She is normal weight. She is not ill-appearing.  HENT:     Head: Normocephalic and atraumatic.  Eyes:     Pupils: Pupils are equal, round, and reactive to light.  Cardiovascular:     Rate and Rhythm: Normal rate. Rhythm irregular.  Pulmonary:     Effort: Pulmonary effort is normal. No respiratory distress.  Neurological:     Mental Status: She is alert and oriented to person, place, and time.     Cranial Nerves: No cranial nerve deficit.     Coordination: Coordination normal.     Gait: Gait (uses walker or cane) normal.  Psychiatric:        Mood and Affect: Mood normal.        Behavior: Behavior normal.        Assessment/Plan: 1. Type 2 diabetes mellitus with other specified complication, with long-term current use of insulin (HCC) Greatly improved A1c down to 7.7. follow up in 4 months  - POCT glycosylated hemoglobin (Hb A1C)  2. Abdominal wall hernia CT scane ordered  - CT ABDOMEN PELVIS WO CONTRAST; Future  3. Colicky RUQ abdominal pain CT scan ordered  - CT ABDOMEN PELVIS WO CONTRAST; Future  4. Constipation by delayed colonic transit Continue linzess as prescribed  - linaclotide (LINZESS) 145 MCG CAPS capsule; Take 1 capsule (145 mcg total) by mouth at bedtime.  Dispense: 90 capsule; Refill: 0  5. Subacute cough Cough medication prescribed, take as needed  - promethazine-dextromethorphan (PROMETHAZINE-DM) 6.25-15 MG/5ML syrup; Take 5 mLs by mouth 4 (four) times daily as needed for cough.  Dispense: 118 mL; Refill: 0  6.  Encounter for medication review Medication list reviewed, updated,  refills ordered  - montelukast (SINGULAIR) 10 MG tablet; Take 1 tablet (10 mg total) by mouth at bedtime.  Dispense: 90 tablet; Refill: 3 - pantoprazole (PROTONIX) 40 MG tablet; Take 1 tablet (40 mg total) by mouth 2 (two) times daily.  Dispense: 180 tablet; Refill: 3   General Counseling: Amand verbalizes understanding of the findings of todays visit and agrees with plan of treatment. I have discussed any further diagnostic evaluation that may be needed or ordered today. We also reviewed her medications today. she has been encouraged to call the office with any questions or concerns that should arise related to todays visit.    Orders Placed This Encounter  Procedures   CT ABDOMEN PELVIS WO CONTRAST   POCT glycosylated hemoglobin (Hb A1C)    Meds ordered this encounter  Medications   linaclotide (LINZESS) 145 MCG CAPS capsule    Sig: Take 1 capsule (145 mcg total) by mouth at bedtime.    Dispense:  90 capsule    Refill:  0   montelukast (SINGULAIR) 10 MG tablet    Sig: Take 1 tablet (10 mg total) by mouth at bedtime.    Dispense:  90 tablet    Refill:  3   promethazine-dextromethorphan (PROMETHAZINE-DM) 6.25-15 MG/5ML syrup    Sig: Take 5 mLs by mouth 4 (four) times daily as needed for cough.    Dispense:  118 mL    Refill:  0   pantoprazole (PROTONIX) 40 MG tablet    Sig: Take 1 tablet (40 mg total) by mouth 2 (two) times daily.    Dispense:  180 tablet    Refill:  3    Return in about 4 months (around 11/09/2023) for F/U, Recheck A1C, Adedamola Seto PCP and will call for CT results per patient/caregiver preference. .   Total time spent:30 Minutes Time spent includes review of chart, medications, test results, and follow up plan with the patient.   [place] Controlled Substance Database was reviewed by me.  This patient was seen by Sallyanne Kuster, FNP-C in collaboration with Dr. Beverely Risen as a part of collaborative  care agreement.   Durelle Zepeda R. Tedd Sias, MSN, FNP-C Internal medicine

## 2023-07-10 NOTE — Telephone Encounter (Signed)
Lvm notifying daughter of CT appointment date, arrival time, location-[name]

## 2023-07-11 NOTE — Progress Notes (Signed)
TARRAN, GOLLY A ([id number]) [id number] Page 1 of 7 Visit Report for 07/10/2023 Chief Complaint Document Details Patient Name: Date of Service: [name], [place] A. 07/10/2023 3:30 PM Medical Record Number: [id number] Patient Account Number: [id number] Date of Birth/Sex: Treating RN: [birth year] (82 y.o. [name] Primary Care Provider: Sallyanne Kuster Other Clinician: Referring Provider: Treating Provider/Extender: Weldon Inches Weeks in Treatment: 15 Information Obtained from: Patient Chief Complaint Left gluteal pressure ulcer Electronic Signature(s) Signed: 07/10/2023 3:56:51 PM By: Allen Derry PA-C Entered By: Allen Derry on 07/10/2023 12:56:51 -------------------------------------------------------------------------------- HPI Details Patient Name: Date of Service: Amy Keel, MA RY A. 07/10/2023 3:30 PM Medical Record Number: [id number] Patient Account Number: [id number] Date of Birth/Sex: Treating RN: 1941-06-29 (82 y.o. [name] Primary Care Provider: Sallyanne Kuster Other Clinician: Referring Provider: Treating Provider/Extender: Weldon Inches Weeks in Treatment: 15 History of Present Illness HPI Description: 03-26-2023 upon evaluation today patient presents for initial evaluation here in the clinic concerning a wound over the left gluteal region this is a stage II pressure ulcer. Fortunately it does not appear to be too deep at all which is great news and very pleased in that regard. The patient is on Coumadin she also has been using mupirocin topically and has doxycycline of which she has 1 week left. The good news is I think the infection looks to be doing much better. With that being said she does spend most of her time in a recliner and I think that is where a lot of the pressure issues coming from. Patient does have a history of diabetes mellitus type 2, she uses insulin for control, hypertension, COPD,  chronic kidney disease stage IV, congestive heart failure, long-term use of anticoagulant therapy due to atrial fibrillation, this specifically his Coumadin, she is legally blind, and is a current smoker. 04-03-2023 upon evaluation today patient appears to be doing well currently in regard to her wound. This is actually showing signs of excellent improvement. Fortunately there does not appear to be any signs of infection locally nor systemically at this time. With that being said the patient does seem to be making some good progress here in regard to left gluteal ulcer. She does have a rash on the right gluteal region. This is something that tends to come and go about once a year. This has been going on for a number of years. 04-09-2023 upon evaluation today patient appears to be doing well currently with regard to her wounds. The area of shingles actually appears to be completely dry and healed up. The area where she has more drainage with regard to the primary wound also appears like it could be healed although not 100% sure on this when I would like to monitor for 1 more week before calling it completely healed. Fortunately I do not see any signs of active infection locally nor systemically which is great news. ZAIMA, CHMIELEWSKI A ([id number]) [id number] Page 2 of 7 04-16-2023 upon evaluation today patient appears to be doing excellent currently in regard to her original wound which is good on the gluteal region this is completely healed. 04-24-2023 upon evaluation today patient appears to be doing well currently in regard to her wound this is actually showing signs of improvement. Fortunately I do not see any evidence of active infection locally or systemically which is great news and in general I do believe that we will making good headway towards complete closure. I do not see any signs of infection the wound  x 0.1cm depth; 0.11cm^2 area and 0.011cm^3 volume. There is Fat Layer (Subcutaneous Tissue) exposed. There is a medium amount of serosanguineous drainage noted. There is large (67-100%) red, pink granulation within the wound bed. There is no necrotic tissue within the wound bed. Assessment Active Problems ICD-10 Unspecified open wound of abdominal wall, right upper quadrant without penetration into peritoneal cavity, subsequent encounter Type 2 diabetes mellitus with other skin ulcer Long term (current) use of insulin Zoster without complications Essential (primary) hypertension Chronic obstructive pulmonary disease, unspecified Chronic kidney disease, stage 4 (severe) Chronic combined systolic (congestive) and diastolic (congestive) heart failure Long term (current) use of anticoagulants Paroxysmal atrial fibrillation Legal blindness, as defined in [place] Nicotine dependence, cigarettes, with other nicotine-induced disorders Other specified abdominal hernia with obstruction, without gangrene Plan Follow-up Appointments: Return Appointment in 1 week. Bathing/ Shower/ Hygiene: Wash wounds with antibacterial soap and water. Anesthetic (Use 'Patient Medications' Section for Anesthetic Order Entry): Lidocaine applied to wound bed WOUND #2: - Abdomen - Lower Quadrant Wound Laterality: Right Cleanser: Soap and Water Every Other Day/30 Days Discharge Instructions: Gently cleanse wound with antibacterial soap, rinse and pat dry prior to dressing wounds Prim Dressing: Hydrofera Blue Ready Transfer Foam, 2.5x2.5 (in/in) (Generic) Every Other Day/30 Days ary Discharge Instructions: Apply Hydrofera Blue Ready to wound bed as directed Secondary Dressing: (BORDER) Zetuvit Plus SILICONE BORDER Dressing 4x4 (in/in) (Generic) Every Other Day/30 Days Discharge Instructions:  Please do not put silicone bordered dressings under wraps. Use non-bordered dressing only. 1. I would recommend that the patient should continue to monitor for any signs of infection or worsening. Based on what I am seeing I do believe that we are making excellent headway towards closure. 2. Will continue with the [facility] which is doing quite well. 3. Will also continue to have this changed 3 times per week and as needed if anything comes all for his soiled. We will see patient back for reevaluation in 1 week here in the clinic. If anything worsens or changes patient will contact our office for additional recommendations. Electronic Signature(s) Signed: 07/10/2023 5:00:34 PM By: Allen Derry PA-C Entered By: Allen Derry on 07/10/2023 14:00:33 Temkin, Michele A ([id number]) [id number] Page 7 of 7 -------------------------------------------------------------------------------- SuperBill Details Patient Name: Date of Service: [name], [place] W[place] A. 07/10/2023 Medical Record Number: [id number] Patient Account Number: [id number] Date of Birth/Sex: Treating RN: 1941/05/02 (82 y.o. [name] Primary Care Provider: Sallyanne Kuster Other Clinician: Referring Provider: Treating Provider/Extender: Weldon Inches Weeks in Treatment: 15 Diagnosis Coding ICD-10 Codes Code Description S31.100D Unspecified open wound of abdominal wall, right upper quadrant without penetration into peritoneal cavity, subsequent encounter E11.622 Type 2 diabetes mellitus with other skin ulcer Z79.4 Long term (current) use of insulin B02.9 Zoster without complications I10 Essential (primary) hypertension J44.9 Chronic obstructive pulmonary disease, unspecified N18.4 Chronic kidney disease, stage 4 (severe) I50.42 Chronic combined systolic (congestive) and diastolic (congestive) heart failure Z79.01 Long term (current) use of anticoagulants I48.0 Paroxysmal atrial  fibrillation H54.8 Legal blindness, as defined in [place] F17.218 Nicotine dependence, cigarettes, with other nicotine-induced disorders K45.0 Other specified abdominal hernia with obstruction, without gangrene Facility Procedures : CPT4 Code: 57846962 Description: 99213 - WOUND CARE VISIT-LEV 3 EST PT Modifier: Quantity: 1 Physician Procedures : CPT4 Code Description Modifier [id number] 99213 - WC PHYS LEVEL 3 - EST PT ICD-10 Diagnosis Description S31.100D Unspecified open wound of abdominal wall, right upper quadrant without penetration into peritoneal cavi subsequent encounter E11.622 Type 2  diabetes mellitus with other  x 0.1cm depth; 0.11cm^2 area and 0.011cm^3 volume. There is Fat Layer (Subcutaneous Tissue) exposed. There is a medium amount of serosanguineous drainage noted. There is large (67-100%) red, pink granulation within the wound bed. There is no necrotic tissue within the wound bed. Assessment Active Problems ICD-10 Unspecified open wound of abdominal wall, right upper quadrant without penetration into peritoneal cavity, subsequent encounter Type 2 diabetes mellitus with other skin ulcer Long term (current) use of insulin Zoster without complications Essential (primary) hypertension Chronic obstructive pulmonary disease, unspecified Chronic kidney disease, stage 4 (severe) Chronic combined systolic (congestive) and diastolic (congestive) heart failure Long term (current) use of anticoagulants Paroxysmal atrial fibrillation Legal blindness, as defined in [place] Nicotine dependence, cigarettes, with other nicotine-induced disorders Other specified abdominal hernia with obstruction, without gangrene Plan Follow-up Appointments: Return Appointment in 1 week. Bathing/ Shower/ Hygiene: Wash wounds with antibacterial soap and water. Anesthetic (Use 'Patient Medications' Section for Anesthetic Order Entry): Lidocaine applied to wound bed WOUND #2: - Abdomen - Lower Quadrant Wound Laterality: Right Cleanser: Soap and Water Every Other Day/30 Days Discharge Instructions: Gently cleanse wound with antibacterial soap, rinse and pat dry prior to dressing wounds Prim Dressing: Hydrofera Blue Ready Transfer Foam, 2.5x2.5 (in/in) (Generic) Every Other Day/30 Days ary Discharge Instructions: Apply Hydrofera Blue Ready to wound bed as directed Secondary Dressing: (BORDER) Zetuvit Plus SILICONE BORDER Dressing 4x4 (in/in) (Generic) Every Other Day/30 Days Discharge Instructions:  Please do not put silicone bordered dressings under wraps. Use non-bordered dressing only. 1. I would recommend that the patient should continue to monitor for any signs of infection or worsening. Based on what I am seeing I do believe that we are making excellent headway towards closure. 2. Will continue with the [facility] which is doing quite well. 3. Will also continue to have this changed 3 times per week and as needed if anything comes all for his soiled. We will see patient back for reevaluation in 1 week here in the clinic. If anything worsens or changes patient will contact our office for additional recommendations. Electronic Signature(s) Signed: 07/10/2023 5:00:34 PM By: Allen Derry PA-C Entered By: Allen Derry on 07/10/2023 14:00:33 Temkin, Michele A ([id number]) [id number] Page 7 of 7 -------------------------------------------------------------------------------- SuperBill Details Patient Name: Date of Service: [name], [place] W[place] A. 07/10/2023 Medical Record Number: [id number] Patient Account Number: [id number] Date of Birth/Sex: Treating RN: 1941/05/02 (82 y.o. [name] Primary Care Provider: Sallyanne Kuster Other Clinician: Referring Provider: Treating Provider/Extender: Weldon Inches Weeks in Treatment: 15 Diagnosis Coding ICD-10 Codes Code Description S31.100D Unspecified open wound of abdominal wall, right upper quadrant without penetration into peritoneal cavity, subsequent encounter E11.622 Type 2 diabetes mellitus with other skin ulcer Z79.4 Long term (current) use of insulin B02.9 Zoster without complications I10 Essential (primary) hypertension J44.9 Chronic obstructive pulmonary disease, unspecified N18.4 Chronic kidney disease, stage 4 (severe) I50.42 Chronic combined systolic (congestive) and diastolic (congestive) heart failure Z79.01 Long term (current) use of anticoagulants I48.0 Paroxysmal atrial  fibrillation H54.8 Legal blindness, as defined in [place] F17.218 Nicotine dependence, cigarettes, with other nicotine-induced disorders K45.0 Other specified abdominal hernia with obstruction, without gangrene Facility Procedures : CPT4 Code: 57846962 Description: 99213 - WOUND CARE VISIT-LEV 3 EST PT Modifier: Quantity: 1 Physician Procedures : CPT4 Code Description Modifier [id number] 99213 - WC PHYS LEVEL 3 - EST PT ICD-10 Diagnosis Description S31.100D Unspecified open wound of abdominal wall, right upper quadrant without penetration into peritoneal cavi subsequent encounter E11.622 Type 2  diabetes mellitus with other  TARRAN, GOLLY A ([id number]) [id number] Page 1 of 7 Visit Report for 07/10/2023 Chief Complaint Document Details Patient Name: Date of Service: [name], [place] A. 07/10/2023 3:30 PM Medical Record Number: [id number] Patient Account Number: [id number] Date of Birth/Sex: Treating RN: [birth year] (82 y.o. [name] Primary Care Provider: Sallyanne Kuster Other Clinician: Referring Provider: Treating Provider/Extender: Weldon Inches Weeks in Treatment: 15 Information Obtained from: Patient Chief Complaint Left gluteal pressure ulcer Electronic Signature(s) Signed: 07/10/2023 3:56:51 PM By: Allen Derry PA-C Entered By: Allen Derry on 07/10/2023 12:56:51 -------------------------------------------------------------------------------- HPI Details Patient Name: Date of Service: Amy Keel, MA RY A. 07/10/2023 3:30 PM Medical Record Number: [id number] Patient Account Number: [id number] Date of Birth/Sex: Treating RN: 1941-06-29 (82 y.o. [name] Primary Care Provider: Sallyanne Kuster Other Clinician: Referring Provider: Treating Provider/Extender: Weldon Inches Weeks in Treatment: 15 History of Present Illness HPI Description: 03-26-2023 upon evaluation today patient presents for initial evaluation here in the clinic concerning a wound over the left gluteal region this is a stage II pressure ulcer. Fortunately it does not appear to be too deep at all which is great news and very pleased in that regard. The patient is on Coumadin she also has been using mupirocin topically and has doxycycline of which she has 1 week left. The good news is I think the infection looks to be doing much better. With that being said she does spend most of her time in a recliner and I think that is where a lot of the pressure issues coming from. Patient does have a history of diabetes mellitus type 2, she uses insulin for control, hypertension, COPD,  chronic kidney disease stage IV, congestive heart failure, long-term use of anticoagulant therapy due to atrial fibrillation, this specifically his Coumadin, she is legally blind, and is a current smoker. 04-03-2023 upon evaluation today patient appears to be doing well currently in regard to her wound. This is actually showing signs of excellent improvement. Fortunately there does not appear to be any signs of infection locally nor systemically at this time. With that being said the patient does seem to be making some good progress here in regard to left gluteal ulcer. She does have a rash on the right gluteal region. This is something that tends to come and go about once a year. This has been going on for a number of years. 04-09-2023 upon evaluation today patient appears to be doing well currently with regard to her wounds. The area of shingles actually appears to be completely dry and healed up. The area where she has more drainage with regard to the primary wound also appears like it could be healed although not 100% sure on this when I would like to monitor for 1 more week before calling it completely healed. Fortunately I do not see any signs of active infection locally nor systemically which is great news. ZAIMA, CHMIELEWSKI A ([id number]) [id number] Page 2 of 7 04-16-2023 upon evaluation today patient appears to be doing excellent currently in regard to her original wound which is good on the gluteal region this is completely healed. 04-24-2023 upon evaluation today patient appears to be doing well currently in regard to her wound this is actually showing signs of improvement. Fortunately I do not see any evidence of active infection locally or systemically which is great news and in general I do believe that we will making good headway towards complete closure. I do not see any signs of infection the wound  x 0.1cm depth; 0.11cm^2 area and 0.011cm^3 volume. There is Fat Layer (Subcutaneous Tissue) exposed. There is a medium amount of serosanguineous drainage noted. There is large (67-100%) red, pink granulation within the wound bed. There is no necrotic tissue within the wound bed. Assessment Active Problems ICD-10 Unspecified open wound of abdominal wall, right upper quadrant without penetration into peritoneal cavity, subsequent encounter Type 2 diabetes mellitus with other skin ulcer Long term (current) use of insulin Zoster without complications Essential (primary) hypertension Chronic obstructive pulmonary disease, unspecified Chronic kidney disease, stage 4 (severe) Chronic combined systolic (congestive) and diastolic (congestive) heart failure Long term (current) use of anticoagulants Paroxysmal atrial fibrillation Legal blindness, as defined in [place] Nicotine dependence, cigarettes, with other nicotine-induced disorders Other specified abdominal hernia with obstruction, without gangrene Plan Follow-up Appointments: Return Appointment in 1 week. Bathing/ Shower/ Hygiene: Wash wounds with antibacterial soap and water. Anesthetic (Use 'Patient Medications' Section for Anesthetic Order Entry): Lidocaine applied to wound bed WOUND #2: - Abdomen - Lower Quadrant Wound Laterality: Right Cleanser: Soap and Water Every Other Day/30 Days Discharge Instructions: Gently cleanse wound with antibacterial soap, rinse and pat dry prior to dressing wounds Prim Dressing: Hydrofera Blue Ready Transfer Foam, 2.5x2.5 (in/in) (Generic) Every Other Day/30 Days ary Discharge Instructions: Apply Hydrofera Blue Ready to wound bed as directed Secondary Dressing: (BORDER) Zetuvit Plus SILICONE BORDER Dressing 4x4 (in/in) (Generic) Every Other Day/30 Days Discharge Instructions:  Please do not put silicone bordered dressings under wraps. Use non-bordered dressing only. 1. I would recommend that the patient should continue to monitor for any signs of infection or worsening. Based on what I am seeing I do believe that we are making excellent headway towards closure. 2. Will continue with the [facility] which is doing quite well. 3. Will also continue to have this changed 3 times per week and as needed if anything comes all for his soiled. We will see patient back for reevaluation in 1 week here in the clinic. If anything worsens or changes patient will contact our office for additional recommendations. Electronic Signature(s) Signed: 07/10/2023 5:00:34 PM By: Allen Derry PA-C Entered By: Allen Derry on 07/10/2023 14:00:33 Temkin, Michele A ([id number]) [id number] Page 7 of 7 -------------------------------------------------------------------------------- SuperBill Details Patient Name: Date of Service: [name], [place] W[place] A. 07/10/2023 Medical Record Number: [id number] Patient Account Number: [id number] Date of Birth/Sex: Treating RN: 1941/05/02 (82 y.o. [name] Primary Care Provider: Sallyanne Kuster Other Clinician: Referring Provider: Treating Provider/Extender: Weldon Inches Weeks in Treatment: 15 Diagnosis Coding ICD-10 Codes Code Description S31.100D Unspecified open wound of abdominal wall, right upper quadrant without penetration into peritoneal cavity, subsequent encounter E11.622 Type 2 diabetes mellitus with other skin ulcer Z79.4 Long term (current) use of insulin B02.9 Zoster without complications I10 Essential (primary) hypertension J44.9 Chronic obstructive pulmonary disease, unspecified N18.4 Chronic kidney disease, stage 4 (severe) I50.42 Chronic combined systolic (congestive) and diastolic (congestive) heart failure Z79.01 Long term (current) use of anticoagulants I48.0 Paroxysmal atrial  fibrillation H54.8 Legal blindness, as defined in [place] F17.218 Nicotine dependence, cigarettes, with other nicotine-induced disorders K45.0 Other specified abdominal hernia with obstruction, without gangrene Facility Procedures : CPT4 Code: 57846962 Description: 99213 - WOUND CARE VISIT-LEV 3 EST PT Modifier: Quantity: 1 Physician Procedures : CPT4 Code Description Modifier [id number] 99213 - WC PHYS LEVEL 3 - EST PT ICD-10 Diagnosis Description S31.100D Unspecified open wound of abdominal wall, right upper quadrant without penetration into peritoneal cavi subsequent encounter E11.622 Type 2  diabetes mellitus with other  x 0.1cm depth; 0.11cm^2 area and 0.011cm^3 volume. There is Fat Layer (Subcutaneous Tissue) exposed. There is a medium amount of serosanguineous drainage noted. There is large (67-100%) red, pink granulation within the wound bed. There is no necrotic tissue within the wound bed. Assessment Active Problems ICD-10 Unspecified open wound of abdominal wall, right upper quadrant without penetration into peritoneal cavity, subsequent encounter Type 2 diabetes mellitus with other skin ulcer Long term (current) use of insulin Zoster without complications Essential (primary) hypertension Chronic obstructive pulmonary disease, unspecified Chronic kidney disease, stage 4 (severe) Chronic combined systolic (congestive) and diastolic (congestive) heart failure Long term (current) use of anticoagulants Paroxysmal atrial fibrillation Legal blindness, as defined in [place] Nicotine dependence, cigarettes, with other nicotine-induced disorders Other specified abdominal hernia with obstruction, without gangrene Plan Follow-up Appointments: Return Appointment in 1 week. Bathing/ Shower/ Hygiene: Wash wounds with antibacterial soap and water. Anesthetic (Use 'Patient Medications' Section for Anesthetic Order Entry): Lidocaine applied to wound bed WOUND #2: - Abdomen - Lower Quadrant Wound Laterality: Right Cleanser: Soap and Water Every Other Day/30 Days Discharge Instructions: Gently cleanse wound with antibacterial soap, rinse and pat dry prior to dressing wounds Prim Dressing: Hydrofera Blue Ready Transfer Foam, 2.5x2.5 (in/in) (Generic) Every Other Day/30 Days ary Discharge Instructions: Apply Hydrofera Blue Ready to wound bed as directed Secondary Dressing: (BORDER) Zetuvit Plus SILICONE BORDER Dressing 4x4 (in/in) (Generic) Every Other Day/30 Days Discharge Instructions:  Please do not put silicone bordered dressings under wraps. Use non-bordered dressing only. 1. I would recommend that the patient should continue to monitor for any signs of infection or worsening. Based on what I am seeing I do believe that we are making excellent headway towards closure. 2. Will continue with the [facility] which is doing quite well. 3. Will also continue to have this changed 3 times per week and as needed if anything comes all for his soiled. We will see patient back for reevaluation in 1 week here in the clinic. If anything worsens or changes patient will contact our office for additional recommendations. Electronic Signature(s) Signed: 07/10/2023 5:00:34 PM By: Allen Derry PA-C Entered By: Allen Derry on 07/10/2023 14:00:33 Temkin, Michele A ([id number]) [id number] Page 7 of 7 -------------------------------------------------------------------------------- SuperBill Details Patient Name: Date of Service: [name], [place] W[place] A. 07/10/2023 Medical Record Number: [id number] Patient Account Number: [id number] Date of Birth/Sex: Treating RN: 1941/05/02 (82 y.o. [name] Primary Care Provider: Sallyanne Kuster Other Clinician: Referring Provider: Treating Provider/Extender: Weldon Inches Weeks in Treatment: 15 Diagnosis Coding ICD-10 Codes Code Description S31.100D Unspecified open wound of abdominal wall, right upper quadrant without penetration into peritoneal cavity, subsequent encounter E11.622 Type 2 diabetes mellitus with other skin ulcer Z79.4 Long term (current) use of insulin B02.9 Zoster without complications I10 Essential (primary) hypertension J44.9 Chronic obstructive pulmonary disease, unspecified N18.4 Chronic kidney disease, stage 4 (severe) I50.42 Chronic combined systolic (congestive) and diastolic (congestive) heart failure Z79.01 Long term (current) use of anticoagulants I48.0 Paroxysmal atrial  fibrillation H54.8 Legal blindness, as defined in [place] F17.218 Nicotine dependence, cigarettes, with other nicotine-induced disorders K45.0 Other specified abdominal hernia with obstruction, without gangrene Facility Procedures : CPT4 Code: 57846962 Description: 99213 - WOUND CARE VISIT-LEV 3 EST PT Modifier: Quantity: 1 Physician Procedures : CPT4 Code Description Modifier [id number] 99213 - WC PHYS LEVEL 3 - EST PT ICD-10 Diagnosis Description S31.100D Unspecified open wound of abdominal wall, right upper quadrant without penetration into peritoneal cavi subsequent encounter E11.622 Type 2  diabetes mellitus with other

## 2023-07-12 NOTE — Progress Notes (Signed)
wounds Peri-Wound Care KERRYANN, MARRERO A ([id number]) [id number] Page 8 of 8 Topical Primary Dressing Hydrofera Blue Ready Transfer Foam, 2.5x2.5 (in/in) Discharge Instruction: Apply Hydrofera Blue Ready to wound bed as directed Secondary Dressing (BORDER) Zetuvit Plus SILICONE BORDER Dressing 4x4 (in/in) Discharge Instruction: Please do not put silicone bordered dressings under wraps. Use non-bordered dressing only. Secured With Compression Wrap Compression Stockings Geologist, engineering) Signed: 07/11/2023 5:31:20 PM By: Midge Aver MSN RN CNS WTA Entered By: Midge Aver on 07/10/2023 12:52:35 -------------------------------------------------------------------------------- Vitals Details Patient Name: Date of Service: Amy Keel, MA RY A. 07/10/2023 3:30 PM Medical Record Number: [id number] Patient Account Number: [id number] Date of Birth/Sex: Treating RN: 24-Aug-1941 (82 y.o. [name] Primary Care Miriam Liles: Sallyanne Kuster Other Clinician: Referring Dymond Spreen: Treating Audwin Semper/Extender: Weldon Inches Weeks in Treatment: 15 Vital Signs Time Taken: 15:49 Temperature (F): 97.7 Height (in): 58 Pulse (bpm): 130 Weight (lbs): 152 Respiratory Rate (breaths/min): 18 Body Mass Index (BMI): 31.8 Blood Pressure (mmHg): 100/64 Reference Range: 80 - 120 mg / dl Electronic Signature(s) Signed: 07/11/2023 5:31:20 PM By: Midge Aver MSN RN CNS WTA Entered By: Midge Aver on 07/10/2023 12:49:50  wounds Peri-Wound Care KERRYANN, MARRERO A ([id number]) [id number] Page 8 of 8 Topical Primary Dressing Hydrofera Blue Ready Transfer Foam, 2.5x2.5 (in/in) Discharge Instruction: Apply Hydrofera Blue Ready to wound bed as directed Secondary Dressing (BORDER) Zetuvit Plus SILICONE BORDER Dressing 4x4 (in/in) Discharge Instruction: Please do not put silicone bordered dressings under wraps. Use non-bordered dressing only. Secured With Compression Wrap Compression Stockings Geologist, engineering) Signed: 07/11/2023 5:31:20 PM By: Midge Aver MSN RN CNS WTA Entered By: Midge Aver on 07/10/2023 12:52:35 -------------------------------------------------------------------------------- Vitals Details Patient Name: Date of Service: Amy Keel, MA RY A. 07/10/2023 3:30 PM Medical Record Number: [id number] Patient Account Number: [id number] Date of Birth/Sex: Treating RN: 24-Aug-1941 (82 y.o. [name] Primary Care Miriam Liles: Sallyanne Kuster Other Clinician: Referring Dymond Spreen: Treating Audwin Semper/Extender: Weldon Inches Weeks in Treatment: 15 Vital Signs Time Taken: 15:49 Temperature (F): 97.7 Height (in): 58 Pulse (bpm): 130 Weight (lbs): 152 Respiratory Rate (breaths/min): 18 Body Mass Index (BMI): 31.8 Blood Pressure (mmHg): 100/64 Reference Range: 80 - 120 mg / dl Electronic Signature(s) Signed: 07/11/2023 5:31:20 PM By: Midge Aver MSN RN CNS WTA Entered By: Midge Aver on 07/10/2023 12:49:50  wounds Peri-Wound Care KERRYANN, MARRERO A ([id number]) [id number] Page 8 of 8 Topical Primary Dressing Hydrofera Blue Ready Transfer Foam, 2.5x2.5 (in/in) Discharge Instruction: Apply Hydrofera Blue Ready to wound bed as directed Secondary Dressing (BORDER) Zetuvit Plus SILICONE BORDER Dressing 4x4 (in/in) Discharge Instruction: Please do not put silicone bordered dressings under wraps. Use non-bordered dressing only. Secured With Compression Wrap Compression Stockings Geologist, engineering) Signed: 07/11/2023 5:31:20 PM By: Midge Aver MSN RN CNS WTA Entered By: Midge Aver on 07/10/2023 12:52:35 -------------------------------------------------------------------------------- Vitals Details Patient Name: Date of Service: Amy Keel, MA RY A. 07/10/2023 3:30 PM Medical Record Number: [id number] Patient Account Number: [id number] Date of Birth/Sex: Treating RN: 24-Aug-1941 (82 y.o. [name] Primary Care Miriam Liles: Sallyanne Kuster Other Clinician: Referring Dymond Spreen: Treating Audwin Semper/Extender: Weldon Inches Weeks in Treatment: 15 Vital Signs Time Taken: 15:49 Temperature (F): 97.7 Height (in): 58 Pulse (bpm): 130 Weight (lbs): 152 Respiratory Rate (breaths/min): 18 Body Mass Index (BMI): 31.8 Blood Pressure (mmHg): 100/64 Reference Range: 80 - 120 mg / dl Electronic Signature(s) Signed: 07/11/2023 5:31:20 PM By: Midge Aver MSN RN CNS WTA Entered By: Midge Aver on 07/10/2023 12:49:50  Type: red, brown N/A N/A Exudate Color: Large (67-100%) N/A N/A Granulation Amount: Red, Pink N/A N/A Granulation Quality: None Present (0%) N/A N/A Necrotic Amount: Fat Layer (Subcutaneous Tissue): Yes N/A N/A Exposed Structures: Fascia: No Tendon: No Muscle: No Joint: No AERON, VIERECK A ([id number]) [id number] Page 5 of 8 Bone: No None N/A N/A Epithelialization: Treatment Notes Electronic Signature(s) Signed: 07/11/2023 5:31:20 PM By: Midge Aver MSN RN CNS WTA Entered By: Midge Aver on 07/10/2023 12:57:30 -------------------------------------------------------------------------------- Multi-Disciplinary Care Plan Details Patient Name: Date of Service: Amy Keel, MA RY A. 07/10/2023 3:30 PM Medical Record Number: [id number] Patient Account Number: [id number] Date of Birth/Sex: Treating RN: 1941/08/16 (82 y.o. [name] Primary Care Dayyan Krist: Sallyanne Kuster Other Clinician: Referring Talayeh Bruinsma: Treating Yuniel Blaney/Extender: Weldon Inches Weeks in Treatment: 15 Active Inactive Electronic Signature(s) Signed: 07/11/2023 5:31:20 PM By: Midge Aver MSN RN CNS WTA Entered By: Midge Aver on 07/10/2023 12:57:57 -------------------------------------------------------------------------------- Pain Assessment Details Patient Name: Date of Service: Amy Keel, MA RY A. 07/10/2023  3:30 PM Medical Record Number: [id number] Patient Account Number: [id number] Date of Birth/Sex: Treating RN: 1941/08/20 (82 y.o. [name] Primary Care Leandrea Ackley: Sallyanne Kuster Other Clinician: Referring Eryca Bolte: Treating Karrisa Didio/Extender: Weldon Inches Weeks in Treatment: 15 Active Problems Location of Pain Severity and Description of Pain Patient Has Paino No Site Locations TEUTA, JARRED A ([id number]) [id number] Page 6 of 8 Pain Management and Medication Current Pain Management: Electronic Signature(s) Signed: 07/11/2023 5:31:20 PM By: Midge Aver MSN RN CNS WTA Entered By: Midge Aver on 07/10/2023 12:49:56 -------------------------------------------------------------------------------- Patient/Caregiver Education Details Patient Name: Date of Service: [name], Jaquita Rector A. 9/17/2024andnbsp3:30 PM Medical Record Number: [id number] Patient Account Number: [id number] Date of Birth/Gender: Treating RN: [birth year] (82 y.o. [name] Primary Care Physician: Sallyanne Kuster Other Clinician: Referring Physician: Treating Physician/Extender: Weldon Inches Weeks in Treatment: 15 Education Assessment Education Provided To: Patient Education Topics Provided Wound/Skin Impairment: Handouts: Caring for Your Ulcer Methods: Explain/Verbal Responses: State content correctly Electronic Signature(s) Signed: 07/11/2023 5:31:20 PM By: Midge Aver MSN RN CNS WTA Entered By: Midge Aver on 07/10/2023 12:58:11 Jodoin, Corrie Dandy A ([id number]) [id number] Page 7 of 8 -------------------------------------------------------------------------------- Wound Assessment Details Patient Name: Date of Service: [name], [place] A. 07/10/2023 3:30 PM Medical Record Number: [id number] Patient Account Number: [id number] Date of Birth/Sex: Treating RN: [birth year] (82 y.o. [name] Primary Care Anshi Jalloh:  Sallyanne Kuster Other Clinician: Referring Maricella Filyaw: Treating Hortense Cantrall/Extender: Weldon Inches Weeks in Treatment: 15 Wound Status Wound Number: 2 Primary Abrasion Etiology: Wound Location: Right Abdomen - Lower Quadrant Wound Open Wounding Event: Gradually Appeared Status: Date Acquired: 04/15/2023 Comorbid Chronic Obstructive Pulmonary Disease (COPD), Sleep Apnea, Weeks Of Treatment: 12 History: Congestive Heart Failure, Hypertension, Type II Diabetes Clustered Wound: No Photos Wound Measurements Length: (cm) 0.2 Width: (cm) 0.7 Depth: (cm) 0.1 Area: (cm) 0.11 Volume: (cm) 0.011 % Reduction in Area: 80% % Reduction in Volume: 90% Epithelialization: None Wound Description Classification: Full Thickness Without Exposed Suppor Exudate Amount: Medium Exudate Type: Serosanguineous Exudate Color: red, brown t Structures Foul Odor After Cleansing: No Slough/Fibrino Yes Wound Bed Granulation Amount: Large (67-100%) Exposed Structure Granulation Quality: Red, Pink Fascia Exposed: No Necrotic Amount: None Present (0%) Fat Layer (Subcutaneous Tissue) Exposed: Yes Tendon Exposed: No Muscle Exposed: No Joint Exposed: No Bone Exposed: No Treatment Notes Wound #2 (Abdomen - Lower Quadrant) Wound Laterality: Right Cleanser Soap and Water Discharge Instruction: Gently cleanse wound with antibacterial soap, rinse and pat dry prior to dressing

## 2023-07-17 ENCOUNTER — Ambulatory Visit: Payer: Medicare HMO | Admitting: Physician Assistant

## 2023-07-20 ENCOUNTER — Other Ambulatory Visit: Payer: Self-pay | Admitting: Nurse Practitioner

## 2023-07-20 DIAGNOSIS — Z79899 Other long term (current) drug therapy: Secondary | ICD-10-CM

## 2023-07-22 ENCOUNTER — Other Ambulatory Visit: Payer: Self-pay

## 2023-07-22 ENCOUNTER — Encounter: Payer: Self-pay | Admitting: Internal Medicine

## 2023-07-22 ENCOUNTER — Observation Stay
Admission: EM | Admit: 2023-07-22 | Discharge: 2023-07-24 | Disposition: A | Discharge status: Home Health | Payer: Medicare HMO | Attending: Internal Medicine | Admitting: Internal Medicine

## 2023-07-22 ENCOUNTER — Observation Stay: Payer: Medicare HMO

## 2023-07-22 ENCOUNTER — Emergency Department: Payer: Medicare HMO

## 2023-07-22 DIAGNOSIS — E1122 Type 2 diabetes mellitus with diabetic chronic kidney disease: Secondary | ICD-10-CM | POA: Diagnosis present

## 2023-07-22 DIAGNOSIS — J449 Chronic obstructive pulmonary disease, unspecified: Secondary | ICD-10-CM | POA: Diagnosis not present

## 2023-07-22 DIAGNOSIS — M7989 Other specified soft tissue disorders: Secondary | ICD-10-CM | POA: Diagnosis not present

## 2023-07-22 DIAGNOSIS — R062 Wheezing: Secondary | ICD-10-CM | POA: Diagnosis not present

## 2023-07-22 DIAGNOSIS — R918 Other nonspecific abnormal finding of lung field: Secondary | ICD-10-CM | POA: Diagnosis not present

## 2023-07-22 DIAGNOSIS — I509 Heart failure, unspecified: Secondary | ICD-10-CM | POA: Diagnosis not present

## 2023-07-22 DIAGNOSIS — M79605 Pain in left leg: Secondary | ICD-10-CM | POA: Diagnosis not present

## 2023-07-22 DIAGNOSIS — Z1152 Encounter for screening for COVID-19: Secondary | ICD-10-CM | POA: Diagnosis not present

## 2023-07-22 DIAGNOSIS — Z7984 Long term (current) use of oral hypoglycemic drugs: Secondary | ICD-10-CM | POA: Insufficient documentation

## 2023-07-22 DIAGNOSIS — Z794 Long term (current) use of insulin: Secondary | ICD-10-CM | POA: Insufficient documentation

## 2023-07-22 DIAGNOSIS — Z96651 Presence of right artificial knee joint: Secondary | ICD-10-CM | POA: Insufficient documentation

## 2023-07-22 DIAGNOSIS — R0602 Shortness of breath: Secondary | ICD-10-CM | POA: Diagnosis not present

## 2023-07-22 DIAGNOSIS — J849 Interstitial pulmonary disease, unspecified: Secondary | ICD-10-CM | POA: Diagnosis not present

## 2023-07-22 DIAGNOSIS — J189 Pneumonia, unspecified organism: Secondary | ICD-10-CM | POA: Diagnosis not present

## 2023-07-22 DIAGNOSIS — I4891 Unspecified atrial fibrillation: Secondary | ICD-10-CM

## 2023-07-22 DIAGNOSIS — N185 Chronic kidney disease, stage 5: Secondary | ICD-10-CM | POA: Insufficient documentation

## 2023-07-22 DIAGNOSIS — I132 Hypertensive heart and chronic kidney disease with heart failure and with stage 5 chronic kidney disease, or end stage renal disease: Secondary | ICD-10-CM | POA: Insufficient documentation

## 2023-07-22 DIAGNOSIS — Z87891 Personal history of nicotine dependence: Secondary | ICD-10-CM | POA: Insufficient documentation

## 2023-07-22 DIAGNOSIS — Z7901 Long term (current) use of anticoagulants: Secondary | ICD-10-CM | POA: Insufficient documentation

## 2023-07-22 DIAGNOSIS — I480 Paroxysmal atrial fibrillation: Secondary | ICD-10-CM | POA: Diagnosis not present

## 2023-07-22 DIAGNOSIS — Z79899 Other long term (current) drug therapy: Secondary | ICD-10-CM | POA: Diagnosis not present

## 2023-07-22 DIAGNOSIS — I110 Hypertensive heart disease with heart failure: Secondary | ICD-10-CM | POA: Diagnosis not present

## 2023-07-22 LAB — BASIC METABOLIC PANEL
Anion gap: 9 (ref 5–15)
BUN: 42 mg/dL — ABNORMAL HIGH (ref 8–23)
CO2: 27 mmol/L (ref 22–32)
Calcium: 8.5 mg/dL — ABNORMAL LOW (ref 8.9–10.3)
Chloride: 105 mmol/L (ref 98–111)
Creatinine, Ser: 2.28 mg/dL — ABNORMAL HIGH (ref 0.44–1.00)
GFR, Estimated: 21 mL/min — ABNORMAL LOW (ref >60)
Glucose, Bld: 208 mg/dL — ABNORMAL HIGH (ref 70–99)
Potassium: 3.2 mmol/L — ABNORMAL LOW (ref 3.5–5.1)
Sodium: 141 mmol/L (ref 135–145)

## 2023-07-22 LAB — CBC
HCT: 27 % — ABNORMAL LOW (ref 36.0–46.0)
Hemoglobin: 8.5 g/dL — ABNORMAL LOW (ref 12.0–15.0)
MCH: 31.4 pg (ref 26.0–34.0)
MCHC: 31.5 g/dL (ref 30.0–36.0)
MCV: 99.6 fL (ref 80.0–100.0)
Platelets: 228 10*3/uL (ref 150–400)
RBC: 2.71 MIL/uL — ABNORMAL LOW (ref 3.87–5.11)
RDW: 14.5 % (ref 11.5–15.5)
WBC: 7.5 10*3/uL (ref 4.0–10.5)
nRBC: 0 % (ref 0.0–0.2)

## 2023-07-22 LAB — PROTIME-INR
INR: 2.6 — ABNORMAL HIGH (ref 0.8–1.2)
Prothrombin Time: 27.7 s — ABNORMAL HIGH (ref 11.4–15.2)

## 2023-07-22 LAB — TROPONIN I (HIGH SENSITIVITY)
Troponin I (High Sensitivity): 7 ng/L (ref <18)
Troponin I (High Sensitivity): 5 ng/L (ref <18)

## 2023-07-22 LAB — CBG MONITORING, ED: Glucose-Capillary: 192 mg/dL — ABNORMAL HIGH (ref 70–99)

## 2023-07-22 LAB — PROCALCITONIN: Procalcitonin: <0.1 ng/mL

## 2023-07-22 LAB — GLUCOSE, CAPILLARY: Glucose-Capillary: 149 mg/dL — ABNORMAL HIGH (ref 70–99)

## 2023-07-22 LAB — SARS CORONAVIRUS 2 BY RT PCR: SARS Coronavirus 2 by RT PCR: NEGATIVE

## 2023-07-22 LAB — BRAIN NATRIURETIC PEPTIDE: B Natriuretic Peptide: 347.1 pg/mL — ABNORMAL HIGH (ref 0.0–100.0)

## 2023-07-22 LAB — APTT: aPTT: 39 s — ABNORMAL HIGH (ref 24–36)

## 2023-07-22 MED ORDER — ACETAMINOPHEN 325 MG PO TABS: 650.0000 mg | ORAL_TABLET | Freq: Four times a day (QID) | ORAL | Status: DC | PRN

## 2023-07-22 MED ORDER — POTASSIUM CHLORIDE 20 MEQ PO PACK
40.0000 meq | PACK | Freq: Once | ORAL | Status: AC
  Administered 2023-07-22: 40 meq via ORAL
  Filled 2023-07-22: qty 2

## 2023-07-22 MED ORDER — SODIUM CHLORIDE 0.9 % IV SOLN
500.0000 mg | INTRAVENOUS | Status: DC
  Administered 2023-07-22 – 2023-07-23 (×2): 500 mg via INTRAVENOUS
  Filled 2023-07-22 (×3): qty 5

## 2023-07-22 MED ORDER — INSULIN ASPART 100 UNIT/ML IJ SOLN
0.0000 [IU] | Freq: Three times a day (TID) | INTRAMUSCULAR | Status: DC
  Administered 2023-07-22 – 2023-07-23 (×2): 3 [IU] via SUBCUTANEOUS
  Administered 2023-07-23: 5 [IU] via SUBCUTANEOUS
  Administered 2023-07-23 – 2023-07-24 (×2): 2 [IU] via SUBCUTANEOUS
  Filled 2023-07-22 (×5): qty 1

## 2023-07-22 MED ORDER — FUROSEMIDE 10 MG/ML IJ SOLN
60.0000 mg | Freq: Once | INTRAMUSCULAR | Status: AC
  Administered 2023-07-22: 60 mg via INTRAVENOUS
  Filled 2023-07-22: qty 8

## 2023-07-22 MED ORDER — TORSEMIDE 20 MG PO TABS
50.0000 mg | ORAL_TABLET | Freq: Every day | ORAL | Status: DC
  Administered 2023-07-23 – 2023-07-24 (×2): 50 mg via ORAL
  Filled 2023-07-22 (×2): qty 3

## 2023-07-22 MED ORDER — INSULIN ASPART 100 UNIT/ML IJ SOLN
0.0000 [IU] | Freq: Every day | INTRAMUSCULAR | Status: DC
  Filled 2023-07-22: qty 1

## 2023-07-22 MED ORDER — ENOXAPARIN SODIUM 30 MG/0.3ML IJ SOSY: 30.0000 mg | PREFILLED_SYRINGE | INTRAMUSCULAR | Status: DC

## 2023-07-22 MED ORDER — ACETAMINOPHEN 650 MG RE SUPP: 650.0000 mg | Freq: Four times a day (QID) | RECTAL | Status: DC | PRN

## 2023-07-22 MED ORDER — WARFARIN - PHARMACIST DOSING INPATIENT
Freq: Every day | Status: DC
  Filled 2023-07-22: qty 1

## 2023-07-22 MED ORDER — IPRATROPIUM-ALBUTEROL 0.5-2.5 (3) MG/3ML IN SOLN
3.0000 mL | Freq: Once | RESPIRATORY_TRACT | Status: AC
  Administered 2023-07-22: 3 mL via RESPIRATORY_TRACT
  Filled 2023-07-22: qty 3

## 2023-07-22 MED ORDER — IPRATROPIUM-ALBUTEROL 0.5-2.5 (3) MG/3ML IN SOLN
3.0000 mL | RESPIRATORY_TRACT | Status: DC | PRN
  Administered 2023-07-23 – 2023-07-24 (×3): 3 mL via RESPIRATORY_TRACT
  Filled 2023-07-22 (×3): qty 3

## 2023-07-22 MED ORDER — PANTOPRAZOLE SODIUM 40 MG PO TBEC
40.0000 mg | DELAYED_RELEASE_TABLET | Freq: Two times a day (BID) | ORAL | Status: DC
  Administered 2023-07-22 – 2023-07-24 (×4): 40 mg via ORAL
  Filled 2023-07-22 (×4): qty 1

## 2023-07-22 MED ORDER — WARFARIN SODIUM 2 MG PO TABS
2.0000 mg | ORAL_TABLET | Freq: Once | ORAL | Status: AC
  Administered 2023-07-22: 2 mg via ORAL
  Filled 2023-07-22 (×2): qty 1

## 2023-07-22 MED ORDER — INSULIN GLARGINE-YFGN 100 UNIT/ML ~~LOC~~ SOLN
20.0000 [IU] | Freq: Every day | SUBCUTANEOUS | Status: DC
  Administered 2023-07-22 – 2023-07-24 (×3): 20 [IU] via SUBCUTANEOUS
  Filled 2023-07-22 (×3): qty 0.2

## 2023-07-22 MED ORDER — SODIUM CHLORIDE 0.9% FLUSH
3.0000 mL | Freq: Two times a day (BID) | INTRAVENOUS | Status: DC
  Administered 2023-07-22 – 2023-07-24 (×5): 3 mL via INTRAVENOUS

## 2023-07-22 MED ORDER — POLYETHYLENE GLYCOL 3350 17 G PO PACK: 17.0000 g | PACK | Freq: Every day | ORAL | Status: DC | PRN

## 2023-07-22 MED ORDER — SODIUM CHLORIDE 0.9 % IV SOLN
1.0000 g | INTRAVENOUS | Status: DC
  Administered 2023-07-22 – 2023-07-24 (×3): 1 g via INTRAVENOUS
  Filled 2023-07-22 (×3): qty 10

## 2023-07-22 MED ORDER — MONTELUKAST SODIUM 10 MG PO TABS
10.0000 mg | ORAL_TABLET | Freq: Every day | ORAL | Status: DC
  Administered 2023-07-22 – 2023-07-23 (×2): 10 mg via ORAL
  Filled 2023-07-22 (×2): qty 1

## 2023-07-22 NOTE — ED Notes (Signed)
Pt placed on monitor at this time by this RN. New set of vital signs obtained and updated in chart.

## 2023-07-22 NOTE — Assessment & Plan Note (Addendum)
Based on the patient's reported complaint of coughing, finding of interstitial lung disease on the chest x-ray, some of which I am sure is chronic.  I do feel the patient warrants treatment for pneumonia.  Blood cultures have been ordered.  Patient will be treated with ceftriaxone and azithromycin. On room air. Check ambulatory pulse ox.  During discussion with ER attending, concern of fluid overload was also discussed.  Patient has received 60 mg of Lasix in the ER.  Patient has had reasonable urine output.  At this time no jugular venous distention no leg swelling.  I intend to continue with patient's chronic diuretic therapy if any.  I think patient is now close to euvolemic.  With good response to Lasix in the ER.  We will also request physical therapy evaluation given that weakness was a major complaint of the patient  No wheezing on author exam. Albuterol prn.

## 2023-07-22 NOTE — Progress Notes (Addendum)
Pt admitted to Room 110 from the ED; pt ambulated from the bed in the hallway to the bed in the room holding on to 2 NTs; pt verbalized that she uses a rolling walker at home; pt also verbalized that she felt short of breath; pt with impaired vision; admission to be completed by the next shift

## 2023-07-22 NOTE — Assessment & Plan Note (Signed)
Patient reported left calf tenderness during evaluation, we will order ultrasound to rule out DVT

## 2023-07-22 NOTE — H&P (Signed)
History and Physical    Patient: [name] KGU:[id number] DOB: 04-22-1941 DOA: 07/22/2023 DOS: the patient was seen and examined on 07/22/2023 PCP: Sallyanne Kuster, NP  Patient coming from: Home  Chief Complaint:  Chief Complaint  Patient presents with   Shortness of Breath   HPI: TIERAH FORGACS is a 82 y.o. female with medical history significant of COPD, patient describes that just walking across her room at home causes her marked shortness of breath.  However for the last 1 week patient is additionally reporting sensation of wheezing, dry cough, and worse weakness.  There is no chest pain and no worse shortness of breath that I could objectively elicit from the patient there is no diarrhea vomiting no loss of consciousness palpitation or leg swelling.  Patient is brought in by family today because of worsening cough and weakness and reported wheezing.  ER course is notable for vital stability, however concern for fluid overload and patient got Lasix in the ER Review of Systems: As mentioned in the history of present illness. All other systems reviewed and are negative. Past Medical History:  Diagnosis Date   Anemia    Atrial fibrillation (HCC)    Chronic kidney disease    Congestive heart failure (CHF) (HCC)    COPD (chronic obstructive pulmonary disease) (HCC)    Diabetes mellitus without complication (HCC)    GERD (gastroesophageal reflux disease)    Hyperlipidemia    Hypertension    Pneumonia    Pulmonary fibrosis (HCC)    Past Surgical History:  Procedure Laterality Date   ABDOMINAL HYSTERECTOMY     APPENDECTOMY     CATARACT EXTRACTION     CHOLECYSTECTOMY     HERNIA REPAIR     right knee replacement     right nephroectomy     Social History:  reports that she quit smoking about 11 months ago. Her smoking use included cigarettes. She has never used smokeless tobacco. She reports that she does not drink alcohol and does not use drugs.  No Known  Allergies  Family History  Problem Relation Age of Onset   Diabetes Mother    Breast cancer Mother    Heart disease Father    Diabetes Father    Diabetes Sister    Diabetes Brother    Cancer Brother     Prior to Admission medications   Medication Sig Start Date End Date Taking? Authorizing Provider  albuterol (VENTOLIN HFA) 108 (90 Base) MCG/ACT inhaler Inhale 2 puffs into the lungs every 6 (six) hours as needed for wheezing or shortness of breath. 05/23/23   Marcelino Duster, MD  amLODipine (NORVASC) 5 MG tablet Take 1 tablet (5 mg total) by mouth daily. 04/09/23   Sallyanne Kuster, NP  atorvastatin (LIPITOR) 10 MG tablet Take 1 tablet (10 mg total) by mouth every evening. 04/09/23   Sallyanne Kuster, NP  citalopram (CELEXA) 20 MG tablet Take 1 tablet (20 mg total) by mouth daily. 04/09/23   Sallyanne Kuster, NP  cyclobenzaprine (FLEXERIL) 5 MG tablet Take 1 tablet (5 mg total) by mouth at bedtime. 05/24/23   Sallyanne Kuster, NP  donepezil (ARICEPT) 5 MG tablet Take 1 tablet (5 mg total) by mouth every morning. 04/09/23   Sallyanne Kuster, NP  ferrous sulfate 325 (65 FE) MG tablet Take 1 tablet (325 mg total) by mouth daily with breakfast. Patient taking differently: Take 325 mg by mouth every evening. 12/30/20   Gillis Santa, MD  guaiFENesin (ROBITUSSIN) 100 MG/5ML liquid Take  5 mLs by mouth every 4 (four) hours as needed for cough or to loosen phlegm. 05/23/23   Marcelino Duster, MD  insulin glargine (LANTUS) 100 UNIT/ML Solostar Pen Inject 22-24 Units into the skin daily. 05/24/23   Sallyanne Kuster, NP  Insulin Pen Needle (PEN NEEDLES 3/16") 31G X 5 MM MISC 1 applicator by Does not apply route 4 (four) times daily. 04/09/23   Sallyanne Kuster, NP  linaclotide (LINZESS) 145 MCG CAPS capsule Take 1 capsule (145 mcg total) by mouth at bedtime. 07/10/23   Sallyanne Kuster, NP  linagliptin (TRADJENTA) 5 MG TABS tablet Take 1 tablet (5 mg total) by mouth daily. 04/09/23   Abernathy,  Arlyss Repress, NP  melatonin 5 MG TABS Take 5 mg by mouth at bedtime as needed (sleep).    [provider]  methocarbamol (ROBAXIN) 500 MG tablet Take 1 tablet (500 mg total) by mouth in the morning and at bedtime. 05/29/23   Sallyanne Kuster, NP  montelukast (SINGULAIR) 10 MG tablet Take 1 tablet (10 mg total) by mouth at bedtime. 07/10/23   Sallyanne Kuster, NP  ondansetron (ZOFRAN-ODT) 4 MG disintegrating tablet Take 1 tablet (4 mg total) by mouth every 6 (six) hours as needed for nausea or vomiting. 11/02/22   Sallyanne Kuster, NP  oxyCODONE-acetaminophen (PERCOCET/ROXICET) 5-325 MG tablet Take 1 tablet by mouth 2 (two) times daily as needed for severe pain. 06/06/23   Sallyanne Kuster, NP  oxyCODONE-acetaminophen (PERCOCET/ROXICET) 5-325 MG tablet Take 1 tablet by mouth 2 (two) times daily as needed for severe pain. 06/06/23   Sallyanne Kuster, NP  pantoprazole (PROTONIX) 40 MG tablet Take 1 tablet (40 mg total) by mouth 2 (two) times daily. 07/10/23   Sallyanne Kuster, NP  promethazine-dextromethorphan (PROMETHAZINE-DM) 6.25-15 MG/5ML syrup Take 5 mLs by mouth 4 (four) times daily as needed for cough. 07/10/23   Sallyanne Kuster, NP  rOPINIRole (REQUIP) 0.5 MG tablet Take 1 tablet (0.5 mg total) by mouth every evening. 04/09/23   Sallyanne Kuster, NP  torsemide (DEMADEX) 20 MG tablet Take 2.5 tablets (50 mg total) by mouth daily. 01/22/23   Sallyanne Kuster, NP  traMADol (ULTRAM) 50 MG tablet Take one tab po bid prn for pain Patient taking differently: Take 50 mg by mouth 2 (two) times daily as needed for severe pain or moderate pain. Take one tab po bid prn for pain 04/09/23   Sallyanne Kuster, NP  vitamin B-12 1000 MCG tablet Take 1 tablet (1,000 mcg total) by mouth daily. 12/30/20   Gillis Santa, MD  warfarin (COUMADIN) 2 MG tablet Take 1 tab po Tuesday ,Thursday ,Friday ad Saturday and Sunday and then take 4 mg rest of week Patient taking differently: Take 2 mg by mouth See admin  instructions. Take 1 tab po Tuesday,Thursday,Friday,Saturday and Sunday and then take 4 mg on Mon and Wed 01/16/23 05/21/23  Sallyanne Kuster, NP  warfarin (COUMADIN) 4 MG tablet TAKE 1 TABLET BY MOUTH ON MONDAY, [facility] AND FRIDAY Patient taking differently: Take 4 mg by mouth See admin instructions. TAKE 1 TABLET BY MOUTH ON MONDAY AND WED 12/25/22   Sallyanne Kuster, NP    Physical Exam: Vitals:   07/22/23 0931 07/22/23 1100 07/22/23 1225 07/22/23 1405  BP:  128/85  116/64  Pulse:  (!) 105 99 (!) 104  Resp:  16 19 (!) 22  Temp:  97.9 F (36.6 C)    TempSrc:  Oral    SpO2:  94% 95% 97%  Weight: 73.5 kg     Height: 5' (  1.524 m)      General: Patient is alert and awake gives a coherent account of her own symptoms does not appear to be distressed Respiratory exam: Right more than left basilar crackles, coarse breath sounds diffusely.  Some right upper lung field crackles anteriorly as well.  No expiratory wheezes are heard patient is on room air Abdomen all quadrants soft nontender Extremities warm without edema left calf cramping is noted. Data Reviewed:  Labs on Admission:  Results for orders placed or performed during the hospital encounter of 07/22/23 (from the past 24 hour(s))  Brain natriuretic peptide     Status: Abnormal   Collection Time: 07/22/23  9:34 AM  Result Value Ref Range   B Natriuretic Peptide 347.1 (H) 0.0 - 100.0 pg/mL  Basic metabolic panel     Status: Abnormal   Collection Time: 07/22/23  9:46 AM  Result Value Ref Range   Sodium 141 135 - 145 mmol/L   Potassium 3.2 (L) 3.5 - 5.1 mmol/L   Chloride 105 98 - 111 mmol/L   CO2 27 22 - 32 mmol/L   Glucose, Bld 208 (H) 70 - 99 mg/dL   BUN 42 (H) 8 - 23 mg/dL   Creatinine, Ser 1.19 (H) 0.44 - 1.00 mg/dL   Calcium 8.5 (L) 8.9 - 10.3 mg/dL   GFR, Estimated 21 (L) >60 mL/min   Anion gap 9 5 - 15  CBC     Status: Abnormal   Collection Time: 07/22/23  9:46 AM  Result Value Ref Range   WBC 7.5 4.0 - 10.5 K/uL    RBC 2.71 (L) 3.87 - 5.11 MIL/uL   Hemoglobin 8.5 (L) 12.0 - 15.0 g/dL   HCT 14.7 (L) 82.9 - 56.2 %   MCV 99.6 80.0 - 100.0 fL   MCH 31.4 26.0 - 34.0 pg   MCHC 31.5 30.0 - 36.0 g/dL   RDW 13.0 86.5 - 78.4 %   Platelets 228 150 - 400 K/uL   nRBC 0.0 0.0 - 0.2 %  Troponin I (High Sensitivity)     Status: None   Collection Time: 07/22/23  9:46 AM  Result Value Ref Range   Troponin I (High Sensitivity) 7 <18 ng/L  SARS Coronavirus 2 by RT PCR (hospital order, performed in [facility] hospital lab) *cepheid single result test* Anterior Nasal Swab     Status: None   Collection Time: 07/22/23 11:14 AM   Specimen: Anterior Nasal Swab  Result Value Ref Range   SARS Coronavirus 2 by RT PCR NEGATIVE NEGATIVE  Troponin I (High Sensitivity)     Status: None   Collection Time: 07/22/23 11:14 AM  Result Value Ref Range   Troponin I (High Sensitivity) 5 <18 ng/L   Basic Metabolic Panel: Recent Labs  Lab 07/22/23 0946  NA 141  K 3.2*  CL 105  CO2 27  GLUCOSE 208*  BUN 42*  CREATININE 2.28*  CALCIUM 8.5*   Liver Function Tests: No results for input(s): "AST", "ALT", "ALKPHOS", "BILITOT", "PROT", "ALBUMIN" in the last 168 hours. No results for input(s): "LIPASE", "AMYLASE" in the last 168 hours. No results for input(s): "AMMONIA" in the last 168 hours. CBC: Recent Labs  Lab 07/22/23 0946  WBC 7.5  HGB 8.5*  HCT 27.0*  MCV 99.6  PLT 228   Cardiac Enzymes: Recent Labs  Lab 07/22/23 0946 07/22/23 1114  TROPONINIHS 7 5    BNP (last 3 results) No results for input(s): "PROBNP" in the last 8760 hours. CBG:  No results for input(s): "GLUCAP" in the last 168 hours.  Radiological Exams on Admission:  DG Chest 2 View  Result Date: 07/22/2023 CLINICAL DATA:  Shortness of breath with coughing and wheezing for 6 days. EXAM: CHEST - 2 VIEW COMPARISON:  Radiographs 05/21/2023 and 10/19/2022.  CT 03/10/2021. FINDINGS: The heart size and mediastinal contours are stable. There is aortic  atherosclerosis. Patient has chronic lung disease with diffuse septal,, central airway and fissural thickening corresponding with interstitial lung disease on previous CT. The interstitial prominence appears slightly greater today, suggesting possible mild superimposed edema. There are possible trace bilateral pleural effusions. No confluent airspace disease or pneumothorax. The bones appear unchanged with scattered degenerative changes in the spine. IMPRESSION: Chronic interstitial lung disease with possible mild superimposed edema. Possible trace bilateral pleural effusions. No confluent airspace disease. Electronically Signed   By: Carey Bullocks M.D.   On: 07/22/2023 10:22    EKG: Independently reviewed. afib   Assessment and Plan: * Pneumonia Based on the patient's reported complaint of coughing, finding of interstitial lung disease on the chest x-ray, some of which I am sure is chronic.  I do feel the patient warrants treatment for pneumonia.  Blood cultures have been ordered.  Patient will be treated with ceftriaxone and azithromycin. On room air. Check ambulatory pulse ox.  During discussion with ER attending, concern of fluid overload was also discussed.  Patient has received 60 mg of Lasix in the ER.  Patient has had reasonable urine output.  At this time no jugular venous distention no leg swelling.  I intend to continue with patient's chronic diuretic therapy if any.  I think patient is now close to euvolemic.  With good response to Lasix in the ER.  We will also request physical therapy evaluation given that weakness was a major complaint of the patient  No wheezing on author exam. Albuterol prn.  Left leg pain Patient reported left calf tenderness during evaluation, we will order ultrasound to rule out DVT  Type 2 diabetes mellitus with stage 5 chronic kidney disease (HCC) Patient takes 20 units of Lantus at home, continue same, add on sliding scale insulin.  Hold sitagliptin at this  time  AF (paroxysmal atrial fibrillation) (HCC) This is a chronic diagnosis, continue with warfarin per pharmacy protocol.  Check INR PTT today.  Rate controlled   Home med rec pending home med collection by pharmacy dept.   Advance Care Planning:   Code Status: Do not attempt resuscitation (DNR) PRE-ARREST INTERVENTIONS DESIRED patient advises to me and is corroborated by her son and daughter at the bedside that she wishes to be DNR.  Consults: none at this time  Family Communication: Son and daughter at the bedside, all questions answered.  Severity of Illness: The appropriate patient status for this patient is OBSERVATION. Observation status is judged to be reasonable and necessary in order to provide the required intensity of service to ensure the patient's safety. The patient's presenting symptoms, physical exam findings, and initial radiographic and laboratory data in the context of their medical condition is felt to place them at decreased risk for further clinical deterioration. Furthermore, it is anticipated that the patient will be medically stable for discharge from the hospital within 2 midnights of admission.   Author: Nolberto Hanlon, MD 07/22/2023 2:43 PM  For on call review [email or web address].

## 2023-07-22 NOTE — ED Provider Notes (Signed)
[facility] Provider Note    Event Date/Time   First MD Initiated Contact with Patient 07/22/23 [phone]     (approximate)  History   Chief Complaint: Shortness of Breath  HPI  [name] is a 82 y.o. female with a past medical history of anemia, CKD, CHF, COPD, diabetes, hypertension, hyperlipidemia, pulmonary fibrosis, who presents to the emergency department for cough and shortness of breath.  According to the patient for the past 1 week she has been having a worsening cough and has been feeling short of breath.  Patient has history of COPD and CHF but does not wear oxygen at baseline.  States she has been using her home inhalers without relief so she came to the emergency department.  Patient denies any pain in her chest or abdomen.  No vomiting nausea or diarrhea.  No fever.  Physical Exam   Triage Vital Signs: ED Triage Vitals  Encounter Vitals Group     BP 07/22/23 0929 (!) 136/90     Systolic BP Percentile --      Diastolic BP Percentile --      Pulse Rate 07/22/23 0929 (!) 110     Resp 07/22/23 0929 20     Temp 07/22/23 0929 98 F (36.7 C)     Temp src --      SpO2 07/22/23 0929 98 %     Weight 07/22/23 0931 162 lb (73.5 kg)     Height 07/22/23 0931 5' (1.524 m)     Head Circumference --      Peak Flow --      Pain Score 07/22/23 0931 0     Pain Loc --      Pain Education --      Exclude from Growth Chart --     Most recent vital signs: Vitals:   07/22/23 0929  BP: (!) 136/90  Pulse: (!) 110  Resp: 20  Temp: 98 F (36.7 C)  SpO2: 98%    General: Awake, no distress.  CV:  Good peripheral perfusion.  Regular rate and rhythm  Resp:  Normal effort.  Equal breath sounds bilaterally.  Abd:  No distention.  Soft, nontender.  No rebound or guarding.  ED Results / Procedures / Treatments   EKG  EKG viewed and interpreted by myself shows atrial fibrillation with rapid ventricular response at 107 bpm with a narrow QRS, normal axis,  normal intervals, nonspecific ST changes.  RADIOLOGY  I have reviewed and interpreted the chest x-ray images.  Patient does appear to have a right hilar opacity on my review. Radiology has read the x-ray is consistent with more chronic interstitial lung disease with possible mild superimposed edema.   MEDICATIONS ORDERED IN ED: Medications  ipratropium-albuterol (DUONEB) 0.5-2.5 (3) MG/3ML nebulizer solution 3 mL (has no administration in time range)  ipratropium-albuterol (DUONEB) 0.5-2.5 (3) MG/3ML nebulizer solution 3 mL (has no administration in time range)     IMPRESSION / MDM / ASSESSMENT AND PLAN / ED COURSE  I reviewed the triage vital signs and the nursing notes.  Patient's presentation is most consistent with acute presentation with potential threat to life or bodily function.  Patient presents to the emergency department for 1 week of worsening cough and shortness of breath.  Overall patient appears well during my evaluation mild tachycardia but clear lung sounds without any obvious wheeze.  We will treat with 2 breathing treatments, we will check labs including cardiac enzymes and a BNP we  will continue to close monitor while awaiting results.  Patient's labs so far show a reassuring CBC shows anemia however this appears to be chronic although slightly worse than typical.  Patient's chemistry shows chronic renal disease similar to past results.  BNP is mildly elevated compared to past results.  Troponin is reassuringly normal.  Given the patient's chest x-ray with findings of possible edema as well as shortness of breath sensation with BNP elevation we will dose IV Lasix.  Patient is currently satting 92% on room air while sitting in bed.  Daughter states that she cannot ambulate more than 15 feet without having to rest due to the significant shortness of breath.  Patient has already taking 50 mg of torsemide daily.  States she has been taking this but has been feeling more short  of breath despite this.  Given the patient's worsening shortness of breath will admit to the hospital service for IV diuresis.  FINAL CLINICAL IMPRESSION(S) / ED DIAGNOSES   Dyspnea CHF exacerbation  Note:  This document was prepared using Dragon voice recognition software and may include unintentional dictation errors.   Minna Antis, MD 07/22/23 1240

## 2023-07-22 NOTE — ED Notes (Signed)
Placed food order for patient.

## 2023-07-22 NOTE — Assessment & Plan Note (Signed)
This is a chronic diagnosis, continue with warfarin per pharmacy protocol.  Check INR PTT today.  Rate controlled

## 2023-07-22 NOTE — Progress Notes (Signed)
ANTICOAGULATION CONSULT NOTE - Initial Consult  Pharmacy Consult for Warfarin Indication: atrial fibrillation  No Known Allergies  Patient Measurements: Height: 5' (152.4 cm) Weight: 73.5 kg (162 lb) IBW/kg (Calculated) : 45.5   Vital Signs: Temp: 97.9 F (36.6 C) (09/29 1100) Temp Source: Oral (09/29 1100) BP: 113/60 (09/29 1500) Pulse Rate: 98 (09/29 1500)  Labs: Recent Labs    07/22/23 0946 07/22/23 1114  HGB 8.5*  --   HCT 27.0*  --   PLT 228  --   CREATININE 2.28*  --   TROPONINIHS 7 5    Estimated Creatinine Clearance: 17 mL/min (A) (by C-G formula based on SCr of 2.28 mg/dL (H)).   Medical History: Past Medical History:  Diagnosis Date   Anemia    Atrial fibrillation (HCC)    Chronic kidney disease    Congestive heart failure (CHF) (HCC)    COPD (chronic obstructive pulmonary disease) (HCC)    Diabetes mellitus without complication (HCC)    GERD (gastroesophageal reflux disease)    Hyperlipidemia    Hypertension    Pneumonia    Pulmonary fibrosis (HCC)     Assessment: Patient is a 82 year old female with a past medical history of COPD who presented to hospital due to worsening cough and weakness. Pharmacy has been consulted to continue patient's home warfarin for atrial fibrillation.  Home warfarin dose: 2 mg daily except for Monday and Wednesday, take 4 mg Baseline INR: 2.6  Date INR Warfarin Dose  9/29 2.6 2 mg (ordered)         Goal of Therapy:  INR 2-3 Monitor platelets by anticoagulation protocol: Yes   Plan:  Baseline INR therapeutic at 2.6 Will give patient's home dose of warfarin, 2 mg x 1 dose Monitor INR and CBC daily  Merryl Hacker, PharmD Clinical Pharmacist 07/22/2023,6:31 PM

## 2023-07-22 NOTE — ED Triage Notes (Signed)
Pt reports coming in with shortness of breath, coughing and wheezing which started this past Monday. Pt denies current pain.

## 2023-07-22 NOTE — Assessment & Plan Note (Signed)
Patient takes 20 units of Lantus at home, continue same, add on sliding scale insulin.  Hold sitagliptin at this time

## 2023-07-23 ENCOUNTER — Ambulatory Visit: Payer: Medicare HMO | Admitting: Physician Assistant

## 2023-07-23 DIAGNOSIS — I4891 Unspecified atrial fibrillation: Secondary | ICD-10-CM | POA: Diagnosis not present

## 2023-07-23 DIAGNOSIS — J189 Pneumonia, unspecified organism: Secondary | ICD-10-CM | POA: Diagnosis not present

## 2023-07-23 LAB — BASIC METABOLIC PANEL
Anion gap: 8 (ref 5–15)
BUN: 36 mg/dL — ABNORMAL HIGH (ref 8–23)
CO2: 29 mmol/L (ref 22–32)
Calcium: 8.5 mg/dL — ABNORMAL LOW (ref 8.9–10.3)
Chloride: 104 mmol/L (ref 98–111)
Creatinine, Ser: 2.14 mg/dL — ABNORMAL HIGH (ref 0.44–1.00)
GFR, Estimated: 23 mL/min — ABNORMAL LOW (ref >60)
Glucose, Bld: 162 mg/dL — ABNORMAL HIGH (ref 70–99)
Potassium: 3.7 mmol/L (ref 3.5–5.1)
Sodium: 141 mmol/L (ref 135–145)

## 2023-07-23 LAB — CBC
HCT: 25.4 % — ABNORMAL LOW (ref 36.0–46.0)
Hemoglobin: 8 g/dL — ABNORMAL LOW (ref 12.0–15.0)
MCH: 30.3 pg (ref 26.0–34.0)
MCHC: 31.5 g/dL (ref 30.0–36.0)
MCV: 96.2 fL (ref 80.0–100.0)
Platelets: 239 10*3/uL (ref 150–400)
RBC: 2.64 MIL/uL — ABNORMAL LOW (ref 3.87–5.11)
RDW: 14.2 % (ref 11.5–15.5)
WBC: 8.6 10*3/uL (ref 4.0–10.5)
nRBC: 0 % (ref 0.0–0.2)

## 2023-07-23 LAB — PROTIME-INR
INR: 2.4 — ABNORMAL HIGH (ref 0.8–1.2)
Prothrombin Time: 26.7 s — ABNORMAL HIGH (ref 11.4–15.2)

## 2023-07-23 LAB — GLUCOSE, CAPILLARY
Glucose-Capillary: 148 mg/dL — ABNORMAL HIGH (ref 70–99)
Glucose-Capillary: 197 mg/dL — ABNORMAL HIGH (ref 70–99)
Glucose-Capillary: 232 mg/dL — ABNORMAL HIGH (ref 70–99)
Glucose-Capillary: 88 mg/dL (ref 70–99)

## 2023-07-23 MED ORDER — POLYETHYLENE GLYCOL 3350 17 G PO PACK: 17.0000 g | PACK | Freq: Every day | ORAL | 0 refills | Status: DC | PRN

## 2023-07-23 MED ORDER — AMOXICILLIN-POT CLAVULANATE 875-125 MG PO TABS: 1.0000 | ORAL_TABLET | Freq: Two times a day (BID) | ORAL | Status: DC

## 2023-07-23 MED ORDER — AMOXICILLIN-POT CLAVULANATE 875-125 MG PO TABS: 1.0000 | ORAL_TABLET | Freq: Two times a day (BID) | ORAL | 0 refills | Status: DC

## 2023-07-23 MED ORDER — WARFARIN SODIUM 4 MG PO TABS
4.0000 mg | ORAL_TABLET | Freq: Once | ORAL | Status: AC
  Administered 2023-07-23: 4 mg via ORAL
  Filled 2023-07-23: qty 1

## 2023-07-23 MED ORDER — WARFARIN SODIUM 2 MG PO TABS
ORAL_TABLET | ORAL | 1 refills | Status: DC
Start: 2023-07-23 — End: 2023-08-24

## 2023-07-23 NOTE — Care Management Obs Status (Signed)
MEDICARE OBSERVATION STATUS NOTIFICATION   Patient Details  Name: [name] MRN: [id number] Date of Birth: Nov 07, 1940   Medicare Observation Status Notification Given:  Yes    Chalise Pe, LCSW 07/23/2023, 10:30 AM

## 2023-07-23 NOTE — Evaluation (Signed)
Physical Therapy Evaluation Patient Details Name: [name] MRN: [id number] DOB: 07-30-1941 Today's Date: 07/23/2023  History of Present Illness  Pt is an 82 y.o. female presenting to hospital 07/22/23 with c/o cough and SOB.  Pt admitted with PNA, L leg pain  (negative for DVT), DM, and paroxysmal a-fib.   PMH includes COPD, CHF, pulmonary fibrosis, DM, CKD, a-fib on Coumadin, htn, HLD, R knee replacement.  Clinical Impression  Prior to recent medical concerns, pt was ambulatory with rollator; lives with family in 1 level home with ramp to enter; goes to adult day program.  Currently pt is modified independent semi-supine to sitting EOB; SBA with transfers; and CGA to ambulate 12 feet with RW use.  Limited distance ambulating d/t SOB (pt's SpO2 sats 89% at rest in bed on room air beginning of session with HR 114 bpm; unable to get SpO2 reading initially after ambulation but eventually able to get reading--spO2 sats 89% with HR 137 bpm; with sitting rest and vc's for pursed lip reading pt's SpO2 sats 92% with HR 123 bpm end of session at rest).  Nurse updated on pt's status and vitals.  Pt would currently benefit from skilled PT to address noted impairments and functional limitations (see below for any additional details).  Upon hospital discharge, pt would benefit from ongoing therapy.    If plan is discharge home, recommend the following: A little help with walking and/or transfers;A little help with bathing/dressing/bathroom;Assistance with cooking/housework;Assist for transportation;Help with stairs or ramp for entrance   Can travel by private vehicle    Yes    Equipment Recommendations None recommended by PT (Pt has rollator at home already)  Recommendations for Other Services  OT consult    Functional Status Assessment Patient has had a recent decline in their functional status and demonstrates the ability to make significant improvements in function in a reasonable and predictable  amount of time.     Precautions / Restrictions Precautions Precautions: Fall Restrictions Weight Bearing Restrictions: No      Mobility  Bed Mobility Overal bed mobility: Modified Independent             General bed mobility comments: Semi-supine to sitting EOB with mild increased effort to perform on own    Transfers Overall transfer level: Needs assistance Equipment used: Rolling walker (2 wheels) Transfers: Sit to/from Stand Sit to Stand: Supervision           General transfer comment: mild increased effort to stand from bed but steady    Ambulation/Gait Ambulation/Gait assistance: Contact guard assist Gait Distance (Feet): 12 Feet Assistive device: Rolling walker (2 wheels) Gait Pattern/deviations: Step-through pattern, Decreased step length - right, Decreased step length - left Gait velocity: decreased     General Gait Details: steady ambulating with RW; limited distance d/t SOB  Stairs            Wheelchair Mobility     Tilt Bed    Modified Rankin (Stroke Patients Only)       Balance Overall balance assessment: Needs assistance Sitting-balance support: No upper extremity supported, Feet supported Sitting balance-Leahy Scale: Good Sitting balance - Comments: steady reaching within BOS   Standing balance support: Bilateral upper extremity supported, During functional activity, Reliant on assistive device for balance Standing balance-Leahy Scale: Good Standing balance comment: steady ambulating with RW use  Pertinent Vitals/Pain Pain Assessment Pain Assessment: No/denies pain    Home Living Family/patient expects to be discharged to:: Private residence Living Arrangements: Children (Pt's son, daughter, and granddaughter) Available Help at Discharge: Family;Available 24 hours/day Type of Home: Mobile home Home Access: Ramped entrance       Home Layout: One level Home Equipment: Rollator (4  wheels);Cane - single point;Grab bars - tub/shower (pt unsure if has manual w/c or transport chair) Additional Comments: Pt goes to adult day services program during the day M-F; transport Zenaida Niece picks her up.    Prior Function Prior Level of Function : Needs assist             Mobility Comments: Modified independent ambulating with rollator within home.  Pt denies any recent falls.       Extremity/Trunk Assessment   Upper Extremity Assessment Upper Extremity Assessment: Overall WFL for tasks assessed    Lower Extremity Assessment Lower Extremity Assessment: Generalized weakness    Cervical / Trunk Assessment Cervical / Trunk Assessment: Other exceptions Cervical / Trunk Exceptions: forward head/shoulders  Communication   Communication Communication: No apparent difficulties Cueing Techniques: Verbal cues  Cognition Arousal: Alert Behavior During Therapy: WFL for tasks assessed/performed Overall Cognitive Status: Within Functional Limits for tasks assessed                                 General Comments: A&O to person, hospital, month, year, and general situation        General Comments  Nursing cleared pt for participation in physical therapy.  Pt agreeable to PT session.    Exercises     Assessment/Plan    PT Assessment Patient needs continued PT services  PT Problem List Decreased strength;Decreased activity tolerance;Decreased mobility;Cardiopulmonary status limiting activity       PT Treatment Interventions DME instruction;Gait training;Functional mobility training;Therapeutic activities;Therapeutic exercise;Balance training;Patient/family education    PT Goals (Current goals can be found in the Care Plan section)  Acute Rehab PT Goals Patient Stated Goal: to improve breathing PT Goal Formulation: With patient Time For Goal Achievement: 08/06/23 Potential to Achieve Goals: Good    Frequency Min 1X/week     Co-evaluation                AM-PAC PT "6 Clicks" Mobility  Outcome Measure Help needed turning from your back to your side while in a flat bed without using bedrails?: None Help needed moving from lying on your back to sitting on the side of a flat bed without using bedrails?: None Help needed moving to and from a bed to a chair (including a wheelchair)?: A Little Help needed standing up from a chair using your arms (e.g., wheelchair or bedside chair)?: A Little Help needed to walk in hospital room?: A Little Help needed climbing 3-5 steps with a railing? : A Little 6 Click Score: 20    End of Session Equipment Utilized During Treatment: Gait belt Activity Tolerance: Other (comment) (Limited d/t SOB) Patient left: in chair;with call bell/phone within reach;with chair alarm set Nurse Communication: Mobility status;Precautions;Other (comment) (Pt's SOB and O2 sats during session) PT Visit Diagnosis: Other abnormalities of gait and mobility (R26.89);Muscle weakness (generalized) (M62.81)    Time: 0981-1914 PT Time Calculation (min) (ACUTE ONLY): 24 min   Charges:   PT Evaluation $PT Eval Low Complexity: 1 Low PT Treatments $Therapeutic Activity: 8-22 mins PT General Charges $$ ACUTE PT VISIT: 1 Visit  Hendricks Limes, PT 07/23/23, 9:10 AM

## 2023-07-23 NOTE — Progress Notes (Signed)
Progress Note   Patient: [name] ZOX:[id number] DOB: May 31, 1941 DOA: 07/22/2023     0 DOS: the patient was seen and examined on 07/23/2023   Subjective:  Patient seen and examined at bedside this morning She admits to improvement in shortness of breath She was able to walk about with PT today Not requiring oxygen She tells me she does not feel ready for discharge today She desaturated to 88 with ambulation    Brief hospital course: From HPI "[name] is a 82 y.o. female with medical history significant of COPD, patient describes that just walking across her room at home causes her marked shortness of breath.  However for the last 1 week patient is additionally reporting sensation of wheezing, dry cough, and worse weakness.  There is no chest pain and no worse shortness of breath that I could objectively elicit from the patient there is no diarrhea vomiting no loss of consciousness palpitation or leg swelling.   Patient is brought in by family today because of worsening cough and weakness and reported wheezing.  ER course is notable for vital stability, however concern for fluid overload and patient got Lasix in the ER "  Assessment and Plan:  Community-acquired pneumonia Currently not requiring oxygen Patient desaturated to 88 with ambulation We will cover with ceftriaxone and azithromycin Continue incentive spirometer Monitor saturation closely   Left leg pain Doppler ultrasound ruled out DVT   Type 2 diabetes mellitus with stage 5 chronic kidney disease (HCC) Continue insulin therapy Hold oral hypoglycemic agents   AF (paroxysmal atrial fibrillation) (HCC) Continue warfarin per pharmacy Monitor rate closely   Disposition: Home when medically stable hopefully tomorrow   Advance Care Planning:   DNR Consults: none at this time   Family Communication: Son and daughter at the bedside, all questions answered.         Physical Exam:  General: Elderly  female sitting up in a chair Respiratory exam: Decreased air entry especially at the bases Abdomen: Nontender no masses palpable CNS: Alert and oriented x 3 nonfocal Abdomen all quadrants soft nontender Extremities warm without edema left calf cramping is noted.  Vitals:   07/23/23 0500 07/23/23 0724 07/23/23 0951 07/23/23 1300  BP:  123/71    Pulse:      Resp:  18    Temp:  98.5 F (36.9 C)    TempSrc:      SpO2:  (!) 86% 92% 97%  Weight: 81.6 kg     Height:        Data Reviewed: I have personally reviewed patient's chest x-ray showing findings of cardiomegaly and some pulmonary vascular markings, I have also reviewed patient's lab results including CBC and BMP as documented below. Reviewed patient's previous admission records briefly, as well as PT OT documentation    Latest Ref Rng & Units 07/23/2023    4:33 AM 07/22/2023    9:46 AM 05/22/2023    4:52 AM  CMP  Glucose 70 - 99 mg/dL 811  914  90   BUN 8 - 23 mg/dL 36  42  37   Creatinine 0.44 - 1.00 mg/dL 7.82  9.56  2.13   Sodium 135 - 145 mmol/L 141  141  135   Potassium 3.5 - 5.1 mmol/L 3.7  3.2  4.6   Chloride 98 - 111 mmol/L 104  105  104   CO2 22 - 32 mmol/L 29  27  25    Calcium 8.9 - 10.3 mg/dL 8.5  8.5  8.5        Latest Ref Rng & Units 07/23/2023    4:33 AM 07/22/2023    9:46 AM 05/23/2023    4:45 AM  CBC  WBC 4.0 - 10.5 K/uL 8.6  7.5  11.4   Hemoglobin 12.0 - 15.0 g/dL 8.0  8.5  9.5   Hematocrit 36.0 - 46.0 % 25.4  27.0  28.5   Platelets 150 - 400 K/uL 239  228  249       Time spent: 56 minutes spent reviewing above data as well as taking care of patient and coordinating care with nursing staff as well as transition of care coordinator.  About 50% of this time was spent at bedside managing patient.  Author: Loyce Dys, MD 07/23/2023 3:13 PM  For on call review [email or web address].

## 2023-07-23 NOTE — TOC Initial Note (Signed)
Transition of Care [facility]) - Initial/Assessment Note    Patient Details  Name: [name] MRN: [id number] Date of Birth: 08-Jul-1941  Transition of Care [facility]) CM/SW Contact:    Allena Katz, LCSW Phone Number: 07/23/2023, 10:31 AM  Clinical Narrative:   Pt admitted from home with daughter with pneumonia. Pt reports she has a RW at home and does not need any additional help or assistive devices. Pt does not want HH as she feels she is walking well here. Pt reports she is active with Dr. Welton Flakes and her daughter drives her to her appointments. Pt states that she does feel she needs oxygen. CSW shared with patients nurse.                 Expected Discharge Plan: Home/Self Care Barriers to Discharge: Continued Medical Work up   Patient Goals and CMS Choice Patient states their goals for this hospitalization and ongoing recovery are:: return home   Choice offered to / list presented to : Patient      Expected Discharge Plan and Services                                              Prior Living Arrangements/Services   Lives with:: Adult Children          Need for Family Participation in Patient Care: Yes (Comment)        Activities of Daily Living   ADL Screening (condition at time of admission) Does the patient have a NEW difficulty with bathing/dressing/toileting/self-feeding that is expected to last >3 days?: No Does the patient have a NEW difficulty with getting in/out of bed, walking, or climbing stairs that is expected to last >3 days?: No Does the patient have a NEW difficulty with communication that is expected to last >3 days?: No Is the patient deaf or have difficulty hearing?: Yes Does the patient have difficulty seeing, even when wearing glasses/contacts?: Yes Does the patient have difficulty concentrating, remembering, or making decisions?: No  Permission Sought/Granted                  Emotional Assessment       Orientation: :  Oriented to Self, Oriented to Place, Oriented to  Time, Oriented to Situation      Admission diagnosis:  Pneumonia [J18.9] Patient Active Problem List   Diagnosis Date Noted   Pneumonia 07/22/2023   Left leg pain 07/22/2023   COPD exacerbation (HCC) 05/22/2023   Pressure injury of skin 05/22/2023   COPD with acute exacerbation (HCC) 05/21/2023   Pulmonary fibrosis (HCC) 05/21/2023   Dementia without behavioral disturbance (HCC) 05/21/2023   Abdominal pain, chronic, right upper quadrant 11/17/2022   Radial styloid tenosynovitis of left hand 07/26/2022   Pain in thumb joint with movement of left hand 05/29/2022   Acute pain of left wrist 05/29/2022   Closed fracture of left upper limb    Fall 11/30/2021   Traumatic closed fracture of surgical neck of left humerus with minimal displacement 11/30/2021   Ingrown nail of great toe of left foot 08/18/2021   Acute on chronic diastolic congestive heart failure (HCC) 05/24/2021   Type II diabetes mellitus with renal manifestations (HCC) 05/24/2021   HLD (hyperlipidemia) 05/24/2021   Chest pain 05/24/2021   Depression 05/24/2021   Legally blind 05/24/2021   Abdominal pain 05/24/2021   Chronic respiratory  failure with hypoxia (HCC) 03/16/2021   SOB (shortness of breath) 03/15/2021   Benign hypertensive kidney disease with chronic kidney disease 03/14/2021   Chronic kidney disease, stage IV (severe) (HCC) 03/14/2021   Acute renal failure superimposed on stage 4 chronic kidney disease (HCC) 02/08/2021   Anemia associated with stage 4 chronic renal failure (HCC) 02/08/2021   Anemia due to stage 5 chronic kidney disease (HCC) 02/07/2021   Acute CHF (congestive heart failure) (HCC) 02/06/2021   Acute on chronic diastolic CHF (congestive heart failure) (HCC) 02/06/2021   Hyperkalemia 02/06/2021   Weakness 12/24/2020   Hypertensive urgency 12/23/2020   Nicotine dependence 12/23/2020   History of anemia due to chronic kidney disease 12/23/2020    Pain due to onychomycosis of toenails of both feet 02/12/2020   Uncontrolled type 2 diabetes mellitus with hyperglycemia (HCC) 05/20/2019   Acute recurrent pansinusitis 05/11/2019   Acute otitis media 05/11/2019   Acute pain of left shoulder 05/11/2019   Long term (current) use of anticoagulants 04/03/2019   Chronic left shoulder pain 03/18/2019   Sepsis (HCC) 01/12/2018   Colitis 01/12/2018   CKD (chronic kidney disease), stage III (HCC) 01/12/2018   Diabetes (HCC) 12/28/2017   AF (paroxysmal atrial fibrillation) (HCC) 12/28/2017   Encounter for general adult medical examination with abnormal findings 12/28/2017   Primary generalized (osteo)arthritis 12/28/2017   Hypertension 12/27/2017   GERD (gastroesophageal reflux disease) 12/27/2017   Low back pain 02/20/2017   Degeneration of lumbar intervertebral disc 02/20/2017   Hematuria 03/18/2015   Chronic combined systolic and diastolic heart failure (HCC) 03/01/2012   Obesity 03/01/2012   CKD stage 4 due to type 2 diabetes mellitus (HCC) 03/01/2012   Other benign neoplasm of connective and other soft tissue of lower limb, including hip 03/15/2011   Neoplasm of connective tissue 02/01/2011   Pain in joint, pelvic region and thigh 02/01/2011   Type 2 diabetes mellitus with stage 5 chronic kidney disease (HCC) 10/03/2006   Encounter for current long-term use of anticoagulants 02/07/2006   Essential hypertension 09/24/2004   Atrial fibrillation with RVR (HCC) 06/07/2004   PCP:  Sallyanne Kuster, NP Pharmacy:   [facility] [address]), [place] - [address] [place] ([place]) [place] [address] Phone: [phone] Fax: [phone]  [id number] [place],  - [address] AT [facility] OF [address] [place] [address] Phone: [phone] Fax: [phone]     Social Determinants of Health (SDOH) Social History: SDOH Screenings    Food Insecurity: No Food Insecurity (07/22/2023)  Housing: Low Risk  (07/22/2023)  Transportation Needs: No Transportation Needs (07/22/2023)  Utilities: Not At Risk (07/22/2023)  Alcohol Screen: Low Risk  (03/14/2022)  Depression (PHQ2-9): Low Risk  (04/09/2023)  Tobacco Use: Medium Risk (05/29/2023)   SDOH Interventions:     Readmission Risk Interventions    06/29/2021    8:45 AM 05/27/2021    2:34 PM  Readmission Risk Prevention Plan  Transportation Screening Complete Complete  Medication Review (RN Care Manager) Complete Complete  PCP or Specialist appointment within 3-5 days of discharge Complete Complete  HRI or Home Care Consult Complete Complete  SW Recovery Care/Counseling Consult Complete Complete  Palliative Care Screening Not Applicable Complete  Skilled Nursing Facility Not Applicable Complete

## 2023-07-23 NOTE — Progress Notes (Signed)
ANTICOAGULATION CONSULT NOTE - Initial Consult  Pharmacy Consult for Warfarin Indication: atrial fibrillation  No Known Allergies  Patient Measurements: Height: 4\' 10"  (147.3 cm) Weight: 81.6 kg (179 lb 14.3 oz) IBW/kg (Calculated) : 40.9   Vital Signs: Temp: 98.5 F (36.9 C) (09/30 0724) Temp Source: Oral (09/30 0442) BP: 123/71 (09/30 0724) Pulse Rate: 96 (09/30 0442)  Labs: Recent Labs    07/22/23 0946 07/22/23 1114 07/22/23 1545 07/23/23 0433  HGB 8.5*  --   --  8.0*  HCT 27.0*  --   --  25.4*  PLT 228  --   --  239  APTT  --   --  39*  --   LABPROT  --   --  27.7* 26.7*  INR  --   --  2.6* 2.4*  CREATININE 2.28*  --   --  2.14*  TROPONINIHS 7 5  --   --     Estimated Creatinine Clearance: 18.3 mL/min (A) (by C-G formula based on SCr of 2.14 mg/dL (H)).   Medical History: Past Medical History:  Diagnosis Date   Anemia    Atrial fibrillation (HCC)    Chronic kidney disease    Congestive heart failure (CHF) (HCC)    COPD (chronic obstructive pulmonary disease) (HCC)    Diabetes mellitus without complication (HCC)    GERD (gastroesophageal reflux disease)    Hyperlipidemia    Hypertension    Pneumonia    Pulmonary fibrosis (HCC)     Assessment: Patient is a 82 year old female with a past medical history of COPD who presented to hospital due to worsening cough and weakness. Pharmacy has been consulted to continue patient's home warfarin for atrial fibrillation.  Home warfarin dose: 2 mg daily except for Monday and Wednesday, take 4 mg Baseline INR: 2.6  Date INR Warfarin Dose  9/29 2.6 2 mg (ordered)  9/30 2.4 4 mg   Interacting medication: Azithromycin  Goal of Therapy:  INR 2-3 Monitor platelets by anticoagulation protocol: Yes   Plan:  INR therapeutic at 2.4 Will give patient's home dose of warfarin, 4 mg x 1 dose Monitor INR and CBC daily  Barrie Folk, PharmD Clinical Pharmacist 07/23/2023,9:25 AM

## 2023-07-24 ENCOUNTER — Ambulatory Visit: Payer: Medicare HMO

## 2023-07-24 DIAGNOSIS — I4891 Unspecified atrial fibrillation: Secondary | ICD-10-CM | POA: Diagnosis not present

## 2023-07-24 LAB — CBC WITH DIFFERENTIAL/PLATELET
Abs Immature Granulocytes: 0.07 10*3/uL (ref 0.00–0.07)
Basophils Absolute: 0.1 10*3/uL (ref 0.0–0.1)
Basophils Relative: 1 %
Eosinophils Absolute: 0.1 10*3/uL (ref 0.0–0.5)
Eosinophils Relative: 2 %
HCT: 26.2 % — ABNORMAL LOW (ref 36.0–46.0)
Hemoglobin: 8.3 g/dL — ABNORMAL LOW (ref 12.0–15.0)
Immature Granulocytes: 1 %
Lymphocytes Relative: 21 %
Lymphs Abs: 2 10*3/uL (ref 0.7–4.0)
MCH: 30.5 pg (ref 26.0–34.0)
MCHC: 31.7 g/dL (ref 30.0–36.0)
MCV: 96.3 fL (ref 80.0–100.0)
Monocytes Absolute: 0.7 10*3/uL (ref 0.1–1.0)
Monocytes Relative: 7 %
Neutro Abs: 6.3 10*3/uL (ref 1.7–7.7)
Neutrophils Relative %: 68 %
Platelets: 242 10*3/uL (ref 150–400)
RBC: 2.72 MIL/uL — ABNORMAL LOW (ref 3.87–5.11)
RDW: 14.3 % (ref 11.5–15.5)
WBC: 9.2 10*3/uL (ref 4.0–10.5)
nRBC: 0 % (ref 0.0–0.2)

## 2023-07-24 LAB — GLUCOSE, CAPILLARY
Glucose-Capillary: 111 mg/dL — ABNORMAL HIGH (ref 70–99)
Glucose-Capillary: 145 mg/dL — ABNORMAL HIGH (ref 70–99)
Glucose-Capillary: 81 mg/dL (ref 70–99)

## 2023-07-24 LAB — PROTIME-INR
INR: 2.2 — ABNORMAL HIGH (ref 0.8–1.2)
Prothrombin Time: 24.6 s — ABNORMAL HIGH (ref 11.4–15.2)

## 2023-07-24 MED ORDER — AZITHROMYCIN 500 MG PO TABS
500.0000 mg | ORAL_TABLET | Freq: Once | ORAL | Status: AC
  Administered 2023-07-24: 500 mg via ORAL
  Filled 2023-07-24: qty 1

## 2023-07-24 MED ORDER — WARFARIN SODIUM 2 MG PO TABS
2.0000 mg | ORAL_TABLET | Freq: Once | ORAL | Status: AC
  Administered 2023-07-24: 2 mg via ORAL
  Filled 2023-07-24: qty 1

## 2023-07-24 NOTE — Progress Notes (Signed)
Received MD order to discharge patient to home with home 02. Ireviewed discharge instructions home meds, prescriptions and follow up appointments with patient and daughter and both verbalized understanding

## 2023-07-24 NOTE — Progress Notes (Signed)
ANTICOAGULATION CONSULT NOTE - Initial Consult  Pharmacy Consult for Warfarin Indication: atrial fibrillation  No Known Allergies  Patient Measurements: Height: 4\' 10"  (147.3 cm) Weight: 79.7 kg (175 lb 11.3 oz) IBW/kg (Calculated) : 40.9   Vital Signs: Temp: 98.2 F (36.8 C) (10/01 0515) Temp Source: Oral (10/01 0515) BP: 111/58 (10/01 0515) Pulse Rate: 101 (10/01 0515)  Labs: Recent Labs    07/22/23 0946 07/22/23 1114 07/22/23 1545 07/23/23 0433 07/24/23 0639  HGB 8.5*  --   --  8.0* 8.3*  HCT 27.0*  --   --  25.4* 26.2*  PLT 228  --   --  239 242  APTT  --   --  39*  --   --   LABPROT  --   --  27.7* 26.7* 24.6*  INR  --   --  2.6* 2.4* 2.2*  CREATININE 2.28*  --   --  2.14*  --   TROPONINIHS 7 5  --   --   --     Estimated Creatinine Clearance: 18 mL/min (A) (by C-G formula based on SCr of 2.14 mg/dL (H)).   Medical History: Past Medical History:  Diagnosis Date   Anemia    Atrial fibrillation (HCC)    Chronic kidney disease    Congestive heart failure (CHF) (HCC)    COPD (chronic obstructive pulmonary disease) (HCC)    Diabetes mellitus without complication (HCC)    GERD (gastroesophageal reflux disease)    Hyperlipidemia    Hypertension    Pneumonia    Pulmonary fibrosis (HCC)     Assessment: Patient is a 82 year old female with a past medical history of COPD who presented to hospital due to worsening cough and weakness. Pharmacy has been consulted to continue patient's home warfarin for atrial fibrillation.  Home warfarin dose: 2 mg daily except for Monday and Wednesday, take 4 mg Baseline INR: 2.6  Date INR Warfarin Dose  9/29 2.6 2 mg (ordered)  9/30 2.4 4 mg  10/1 2.2    Interacting medication: Azithromycin  Goal of Therapy:  INR 2-3 Monitor platelets by anticoagulation protocol: Yes   Plan:  INR therapeutic at 2.2 Will give patient's home dose of warfarin, 2 mg x 1 dose Monitor INR and CBC daily  Gardner Candle, PharmD,  BCPS Clinical Pharmacist 07/24/2023 8:05 AM

## 2023-07-24 NOTE — Discharge Summary (Signed)
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 female with medical history significant of COPD, patient describes that just walking across her room at home causes her marked shortness of breath.  However for the last 1 week patient is additionally reporting sensation of wheezing, dry cough, and worse weakness.  Patient also requested for management of community-acquired pneumonia requiring IV antibiotics.  Patient also found to have new oxygen requirement with exertion.  She underwent therapy with physical therapy and was recommended for home health.  She is therefore being discharged to complete oral antibiotic therapy.    Consultants: None Procedures performed: None Disposition: Home Diet recommendation:  Cardiac diet DISCHARGE MEDICATION: Allergies as of 07/24/2023   No Known Allergies      Medication List     STOP taking these medications    amLODipine 5 MG tablet Commonly known as: NORVASC   cyclobenzaprine 5 MG tablet Commonly known as: FLEXERIL   traMADol 50 MG tablet Commonly known as: ULTRAM       TAKE these medications    albuterol 108 (90 Base) MCG/ACT inhaler Commonly known as: VENTOLIN HFA Inhale 2 puffs into the lungs every 6 (six) hours as needed for wheezing or shortness of breath.   amoxicillin-clavulanate 875-125 MG tablet Commonly known as: AUGMENTIN Take 1 tablet by mouth 2 (two) times daily.   atorvastatin 10 MG tablet Commonly known as: LIPITOR Take  1 tablet (10 mg total) by mouth every evening.   citalopram 20 MG tablet Commonly known as: CELEXA Take 1 tablet (20 mg total) by mouth daily.   cyanocobalamin 1000 MCG tablet Take 1 tablet (1,000 mcg total) by mouth daily.   donepezil 5 MG tablet Commonly known as: ARICEPT Take 1 tablet (5 mg total) by mouth every morning.   ferrous sulfate 325 (65 FE) MG tablet Take 1 tablet (325 mg total) by mouth daily with breakfast. What changed: when to take this   guaiFENesin 100 MG/5ML liquid Commonly known as: ROBITUSSIN Take 5 mLs by mouth every 4 (four) hours as needed for cough or to loosen phlegm.   insulin glargine 100 UNIT/ML Solostar Pen Commonly known as: LANTUS Inject 22-24 Units into the skin daily.   linaclotide 145 MCG Caps capsule Commonly known as: Linzess Take 1 capsule (145 mcg total) by mouth at bedtime.   linagliptin 5 MG Tabs tablet Commonly known as: Tradjenta Take 1 tablet (5 mg total) by mouth daily.   melatonin 5 MG Tabs Take 5 mg by mouth at bedtime as needed (sleep).   methocarbamol 500 MG tablet Commonly known as: ROBAXIN Take 1 tablet (500 mg total) by mouth in the morning and at bedtime.   montelukast 10 MG tablet Commonly known as: SINGULAIR Take 1 tablet (10 mg total) by mouth at bedtime.   ondansetron 4 MG disintegrating tablet Commonly known as: ZOFRAN-ODT Take 1 tablet (4 mg total) by mouth every 6 (six) hours as needed for nausea or vomiting.   oxyCODONE-acetaminophen 5-325 MG tablet Commonly known as: PERCOCET/ROXICET Take 1 tablet by  mouth 2 (two) times daily as needed for severe pain.   pantoprazole 40 MG tablet Commonly known as: PROTONIX Take 1 tablet (40 mg total) by mouth 2 (two) times daily.   Pen Needles 3/16" 31G X 5 MM Misc 1 applicator by Does not apply route 4 (four) times daily.   polyethylene glycol 17 g packet Commonly known as: MIRALAX / GLYCOLAX Take 17 g by mouth daily as needed for mild constipation.    promethazine-dextromethorphan 6.25-15 MG/5ML syrup Commonly known as: PROMETHAZINE-DM Take 5 mLs by mouth 4 (four) times daily as needed for cough.   rOPINIRole 0.5 MG tablet Commonly known as: REQUIP Take 1 tablet (0.5 mg total) by mouth every evening.   torsemide 20 MG tablet Commonly known as: DEMADEX Take 2.5 tablets (50 mg total) by mouth daily.   warfarin 4 MG tablet Commonly known as: COUMADIN TAKE 1 TABLET BY MOUTH ON MONDAY, [facility] AND FRIDAY What changed:  how much to take how to take this when to take this additional instructions   warfarin 2 MG tablet Commonly known as: COUMADIN Take 1 tab po Tuesday ,Thursday ,Friday ad Saturday and Sunday and then take 4 mg rest of week What changed:  how much to take how to take this when to take this additional instructions               Durable Medical Equipment  (From admission, onward)           Start     Ordered   07/24/23 1610  For home use only DME oxygen  Once       Question Answer Comment  Length of Need 6 Months   Mode or (Route) Nasal cannula   Liters per Minute 3   Frequency Continuous (stationary and portable oxygen unit needed)   Oxygen conserving device No   Oxygen delivery system Gas      10 /01/24 1610            Discharge Exam: Filed Weights   07/22/23 2003 07/23/23 0500 07/24/23 0244  Weight: 73.4 kg 81.6 kg 79.7 kg   General: Elderly female sitting up in a chair without oxygen Respiratory exam: Decreased air entry especially at the bases Abdomen: Nontender no masses palpable CNS: Alert and oriented x 3 nonfocal Abdomen all quadrants soft nontender Extremities warm without edema  Condition at discharge: good  The results of significant diagnostics from this hospitalization (including imaging, microbiology, ancillary and laboratory) are listed below for reference.   Imaging Studies: US Venous Img Lower Bilateral (DVT)  Result Date: 07/22/2023 CLINICAL DATA:  Lower  extremity cramping and spasm. Pain and swelling. EXAM: BILATERAL LOWER EXTREMITY VENOUS DOPPLER ULTRASOUND TECHNIQUE: Gray-scale sonography with graded compression, as well as color Doppler and duplex ultrasound were performed to evaluate the lower extremity deep venous systems from the level of the common femoral vein and including the common femoral, femoral, profunda femoral, popliteal and calf veins including the posterior tibial, peroneal and gastrocnemius veins when visible. The superficial great saphenous vein was also interrogated. Spectral Doppler was utilized to evaluate flow at rest and with distal augmentation maneuvers in the common femoral, femoral and popliteal veins. COMPARISON:  None Available. FINDINGS: RIGHT LOWER EXTREMITY Common Femoral Vein: No evidence of thrombus. Normal compressibility, respiratory phasicity and response to augmentation. Saphenofemoral Junction: No evidence of thrombus. Normal compressibility and flow on color Doppler imaging. Profunda Femoral Vein: No evidence of thrombus. Normal compressibility and flow on color Doppler imaging. Femoral Vein: No  evidence of thrombus. Normal compressibility, respiratory phasicity and response to augmentation. Popliteal Vein: No evidence of thrombus. Normal compressibility, respiratory phasicity and response to augmentation. Calf Veins: No evidence of thrombus. Normal compressibility and flow on color Doppler imaging. Superficial Great Saphenous Vein: No evidence of thrombus. Normal compressibility. Venous Reflux:  None. Other Findings:  None. LEFT LOWER EXTREMITY Common Femoral Vein: No evidence of thrombus. Normal compressibility, respiratory phasicity and response to augmentation. Saphenofemoral Junction: No evidence of thrombus. Normal compressibility and flow on color Doppler imaging. Profunda Femoral Vein: No evidence of thrombus. Normal compressibility and flow on color Doppler imaging. Femoral Vein: No evidence of thrombus. Normal  compressibility, respiratory phasicity and response to augmentation. Popliteal Vein: No evidence of thrombus. Normal compressibility, respiratory phasicity and response to augmentation. Calf Veins: No evidence of thrombus. Normal compressibility and flow on color Doppler imaging. Superficial Great Saphenous Vein: No evidence of thrombus. Normal compressibility. Venous Reflux:  None. Other Findings:  None. IMPRESSION: No evidence of deep venous thrombosis in either lower extremity. Electronically Signed   By: Paulina Fusi M.D.   On: 07/22/2023 16:09   DG Chest 2 View  Result Date: 07/22/2023 CLINICAL DATA:  Shortness of breath with coughing and wheezing for 6 days. EXAM: CHEST - 2 VIEW COMPARISON:  Radiographs 05/21/2023 and 10/19/2022.  CT 03/10/2021. FINDINGS: The heart size and mediastinal contours are stable. There is aortic atherosclerosis. Patient has chronic lung disease with diffuse septal,, central airway and fissural thickening corresponding with interstitial lung disease on previous CT. The interstitial prominence appears slightly greater today, suggesting possible mild superimposed edema. There are possible trace bilateral pleural effusions. No confluent airspace disease or pneumothorax. The bones appear unchanged with scattered degenerative changes in the spine. IMPRESSION: Chronic interstitial lung disease with possible mild superimposed edema. Possible trace bilateral pleural effusions. No confluent airspace disease. Electronically Signed   By: Carey Bullocks M.D.   On: 07/22/2023 10:22    Microbiology: Results for orders placed or performed during the hospital encounter of 07/22/23  SARS Coronavirus 2 by RT PCR (hospital order, performed in [facility] hospital lab) *cepheid single result test* Anterior Nasal Swab     Status: None   Collection Time: 07/22/23 11:14 AM   Specimen: Anterior Nasal Swab  Result Value Ref Range Status   SARS Coronavirus 2 by RT PCR NEGATIVE NEGATIVE Final     Comment: (NOTE) SARS-CoV-2 target nucleic acids are NOT DETECTED.  The SARS-CoV-2 RNA is generally detectable in upper and lower respiratory specimens during the acute phase of infection. The lowest concentration of SARS-CoV-2 viral copies this assay can detect is 250 copies / mL. A negative result does not preclude SARS-CoV-2 infection and should not be used as the sole basis for treatment or other patient management decisions.  A negative result may occur with improper specimen collection / handling, submission of specimen other than nasopharyngeal swab, presence of viral mutation(s) within the areas targeted by this assay, and inadequate number of viral copies (<250 copies / mL). A negative result must be combined with clinical observations, patient history, and epidemiological information.  Fact Sheet for Patients:   RoadLapTop.co.za  Fact Sheet for Healthcare Providers: http://kim-miller.com/  This test is not yet approved or  cleared by the [place] FDA and has been authorized for detection and/or diagnosis of SARS-CoV-2 by FDA under an Emergency Use Authorization (EUA).  This EUA will remain in effect (meaning this test can be used) for the duration of the COVID-19 declaration under Section  564(b)(1) of the Act, 21 U.S.C. section 360bbb-3(b)(1), unless the authorization is terminated or revoked sooner.  Performed at [facility], [address] [place], [place] [address]   Culture, blood (Routine X 2) w Reflex to ID Panel     Status: None (Preliminary result)   Collection Time: 07/22/23  2:05 PM   Specimen: BLOOD  Result Value Ref Range Status   Specimen Description BLOOD LEFT ANTECUBITAL  Final   Special Requests   Final    BOTTLES DRAWN AEROBIC AND ANAEROBIC Blood Culture adequate volume   Culture   Final    NO GROWTH 2 DAYS Performed at [facility], [address], [place], [place] [address]     Report Status PENDING  Incomplete  Culture, blood (Routine X 2) w Reflex to ID Panel     Status: None (Preliminary result)   Collection Time: 07/22/23  2:05 PM   Specimen: BLOOD  Result Value Ref Range Status   Specimen Description BLOOD RIGHT ANTECUBITAL  Final   Special Requests   Final    BOTTLES DRAWN AEROBIC ONLY Blood Culture results may not be optimal due to an inadequate volume of blood received in culture bottles   Culture   Final    NO GROWTH 2 DAYS Performed at [facility], [address] [place], [place] [address]    Report Status PENDING  Incomplete    Labs: CBC: Recent Labs  Lab 07/22/23 0946 07/23/23 0433 07/24/23 0639  WBC 7.5 8.6 9.2  NEUTROABS  --   --  6.3  HGB 8.5* 8.0* 8.3*  HCT 27.0* 25.4* 26.2*  MCV 99.6 96.2 96.3  PLT 228 239 242   Basic Metabolic Panel: Recent Labs  Lab 07/22/23 0946 07/23/23 0433  NA 141 141  K 3.2* 3.7  CL 105 104  CO2 27 29  GLUCOSE 208* 162*  BUN 42* 36*  CREATININE 2.28* 2.14*  CALCIUM 8.5* 8.5*   Liver Function Tests: No results for input(s): "AST", "ALT", "ALKPHOS", "BILITOT", "PROT", "ALBUMIN" in the last 168 hours. CBG: Recent Labs  Lab 07/23/23 1139 07/23/23 1526 07/23/23 2134 07/24/23 0811 07/24/23 1224  GLUCAP 197* 232* 88 111* 145*    Discharge time spent:  37 minutes.  Signed: Loyce Dys, MD [facility] 07/24/2023

## 2023-07-24 NOTE — Plan of Care (Signed)
  Problem: Education: Goal: Ability to describe self-care measures that may prevent or decrease complications (Diabetes Survival Skills Education) will improve Outcome: Progressing Goal: Individualized Educational Video(s) Outcome: Progressing   Problem: Coping: Goal: Ability to adjust to condition or change in health will improve Outcome: Progressing   Problem: Fluid Volume: Goal: Ability to maintain a balanced intake and output will improve Outcome: Progressing   Problem: Health Behavior/Discharge Planning: Goal: Ability to identify and utilize available resources and services will improve Outcome: Progressing Goal: Ability to manage health-related needs will improve Outcome: Progressing   Problem: Metabolic: Goal: Ability to maintain appropriate glucose levels will improve Outcome: Progressing   Problem: Nutritional: Goal: Maintenance of adequate nutrition will improve Outcome: Progressing Goal: Progress toward achieving an optimal weight will improve Outcome: Progressing   Problem: Skin Integrity: Goal: Risk for impaired skin integrity will decrease Outcome: Progressing   Problem: Tissue Perfusion: Goal: Adequacy of tissue perfusion will improve Outcome: Progressing   Problem: Education: Goal: Knowledge of General Education information will improve Description: Including pain rating scale, medication(s)/side effects and non-pharmacologic comfort measures Outcome: Progressing   Problem: Clinical Measurements: Goal: Ability to maintain clinical measurements within normal limits will improve Outcome: Progressing   Problem: Activity: Goal: Risk for activity intolerance will decrease Outcome: Progressing   Problem: Nutrition: Goal: Adequate nutrition will be maintained Outcome: Progressing   Problem: Coping: Goal: Level of anxiety will decrease Outcome: Progressing

## 2023-07-24 NOTE — TOC Progression Note (Signed)
Transition of Care [facility]) - Progression Note    Patient Details  Name: [name] MRN: [id number] Date of Birth: 09/07/1941  Transition of Care [facility]) CM/SW Contact  Allena Katz, LCSW Phone Number: 07/24/2023, 3:09 PM  Clinical Narrative:   Pt qualifies for home oxygen oxygen ordered to be delivered through [facility].    Expected Discharge Plan: Home/Self Care Barriers to Discharge: Continued Medical Work up  Expected Discharge Plan and Services         Expected Discharge Date: 07/24/23                                     Social Determinants of Health (SDOH) Interventions SDOH Screenings   Food Insecurity: No Food Insecurity (07/22/2023)  Housing: Low Risk  (07/22/2023)  Transportation Needs: No Transportation Needs (07/22/2023)  Utilities: Not At Risk (07/22/2023)  Alcohol Screen: Low Risk  (03/14/2022)  Depression (PHQ2-9): Low Risk  (04/09/2023)  Tobacco Use: Medium Risk (05/29/2023)    Readmission Risk Interventions    06/29/2021    8:45 AM 05/27/2021    2:34 PM  Readmission Risk Prevention Plan  Transportation Screening Complete Complete  Medication Review (RN Care Manager) Complete Complete  PCP or Specialist appointment within 3-5 days of discharge Complete Complete  HRI or Home Care Consult Complete Complete  SW Recovery Care/Counseling Consult Complete Complete  Palliative Care Screening Not Applicable Complete  Skilled Nursing Facility Not Applicable Complete

## 2023-07-24 NOTE — Progress Notes (Signed)
Mobility Specialist - Progress Note   07/24/23 1058  Mobility  Activity Ambulated with assistance in hallway  Level of Assistance Standby assist, set-up cues, supervision of patient - no hands on  Assistive Device Front wheel walker  Distance Ambulated (ft) 160 ft  Activity Response Tolerated well  $Mobility charge 1 Mobility     Pre-mobility: 111 HR, 94% SpO2 During mobility: 129 HR, 77-98% SpO2 Post-mobility: 109 HR, 98% SpO2   Pt sitting in recliner upon arrival, utilizing RA. Pt agreeable to activity. STS and ambulation with minG. No LOB. O2 readings seemingly 77% on dinamap, but appearing 97% via pulse ox. Pt voiced SOB with exertion so O2 increased to 2L. HR ranging 111-129 bpm with activity. Pt returned to room, in recliner, with family at bedside. RN notified.    [name] Mobility Specialist 07/24/23, 11:19 AM

## 2023-07-24 NOTE — Progress Notes (Signed)
Mobility Specialist - Progress Note   07/24/23 1400  Mobility  Activity Ambulated with assistance in hallway  Level of Assistance Standby assist, set-up cues, supervision of patient - no hands on  Assistive Device Front wheel walker  Distance Ambulated (ft) 80 ft  Activity Response Tolerated well  $Mobility charge 1 Mobility     O2 while resting on RA = 96%  O2 while AMB on RA = 84%  O2 while AMB on 3L = 90%   Pt sitting in recliner upon arrival, utilizing RA. Pt ambulated in hallway with minG. SOB with exertion. O2 desat to mid 80s, bumped up to 3L for recovery > 90%. AF HR ranging 108-140s. Activity limited by fatigue and HR. Pt returned to bed with alarm set, needs in reach. RN notified.   Filiberto Pinks Mobility Specialist 07/24/23, 3:02 PM

## 2023-07-25 ENCOUNTER — Other Ambulatory Visit: Payer: Self-pay | Admitting: Nurse Practitioner

## 2023-07-25 DIAGNOSIS — I5042 Chronic combined systolic (congestive) and diastolic (congestive) heart failure: Secondary | ICD-10-CM

## 2023-07-27 LAB — CULTURE, BLOOD (ROUTINE X 2)
Culture: NO GROWTH
Culture: NO GROWTH
Special Requests: ADEQUATE

## 2023-07-31 ENCOUNTER — Telehealth: Payer: Self-pay | Admitting: Nurse Practitioner

## 2023-07-31 ENCOUNTER — Ambulatory Visit (INDEPENDENT_AMBULATORY_CARE_PROVIDER_SITE_OTHER): Payer: Medicare HMO | Admitting: Internal Medicine

## 2023-07-31 ENCOUNTER — Encounter: Payer: Self-pay | Admitting: Internal Medicine

## 2023-07-31 VITALS — BP 124/65 | HR 108 | Temp 98.2°F | Resp 16 | Ht <= 58 in | Wt 158.0 lb

## 2023-07-31 DIAGNOSIS — N184 Chronic kidney disease, stage 4 (severe): Secondary | ICD-10-CM

## 2023-07-31 DIAGNOSIS — Z794 Long term (current) use of insulin: Secondary | ICD-10-CM | POA: Diagnosis not present

## 2023-07-31 DIAGNOSIS — I5042 Chronic combined systolic (congestive) and diastolic (congestive) heart failure: Secondary | ICD-10-CM

## 2023-07-31 DIAGNOSIS — J849 Interstitial pulmonary disease, unspecified: Secondary | ICD-10-CM | POA: Diagnosis not present

## 2023-07-31 DIAGNOSIS — D649 Anemia, unspecified: Secondary | ICD-10-CM | POA: Diagnosis not present

## 2023-07-31 DIAGNOSIS — E1122 Type 2 diabetes mellitus with diabetic chronic kidney disease: Secondary | ICD-10-CM

## 2023-07-31 DIAGNOSIS — N185 Chronic kidney disease, stage 5: Secondary | ICD-10-CM | POA: Diagnosis not present

## 2023-07-31 DIAGNOSIS — Z7901 Long term (current) use of anticoagulants: Secondary | ICD-10-CM | POA: Diagnosis not present

## 2023-07-31 DIAGNOSIS — H548 Legal blindness, as defined in USA: Secondary | ICD-10-CM | POA: Diagnosis not present

## 2023-07-31 LAB — POCT INR
POC INR: 1.8
Prothrombin Time: 21.9

## 2023-07-31 NOTE — Telephone Encounter (Signed)
Letter faxed to [facility]; [phone]. Scanned-Toni

## 2023-07-31 NOTE — Progress Notes (Signed)
Patient's INR today is 1.8, 21.9 seconds. Patient takes 2mg  of Wafarin on Tuesday, Thursday, Saturday and Sunday. She then takes 4mg  of Warfarin on Monday, Wednesday and Friday. Per DFK no change needed.

## 2023-07-31 NOTE — Progress Notes (Signed)
[facility] [address] [place], [place] [address]  Internal MEDICINE  Office Visit Note  Patient Name: [name]  [id number]  [id number]  Date of Service: 08/08/2023  Chief Complaint  Patient presents with   Hospitalization Follow-up   Atrial Fibrillation    HPI Pt is seen for follow up for hospitalization  Had dx of pneumonia, was admitted with sob, feels better now  Has chronic atrial fib, on coumadin   ILD has been stable  Chronic pain  Diabetes under good control , last A1c is 7.7    Current Medication: Outpatient Encounter Medications as of 07/31/2023  Medication Sig   albuterol (VENTOLIN HFA) 108 (90 Base) MCG/ACT inhaler Inhale 2 puffs into the lungs every 6 (six) hours as needed for wheezing or shortness of breath.   atorvastatin (LIPITOR) 10 MG tablet Take 1 tablet (10 mg total) by mouth every evening.   citalopram (CELEXA) 20 MG tablet Take 1 tablet (20 mg total) by mouth daily.   donepezil (ARICEPT) 5 MG tablet Take 1 tablet (5 mg total) by mouth every morning.   ferrous sulfate 325 (65 FE) MG tablet Take 1 tablet (325 mg total) by mouth daily with breakfast. (Patient taking differently: Take 325 mg by mouth every evening.)   guaiFENesin (ROBITUSSIN) 100 MG/5ML liquid Take 5 mLs by mouth every 4 (four) hours as needed for cough or to loosen phlegm.   insulin glargine (LANTUS) 100 UNIT/ML Solostar Pen Inject 22-24 Units into the skin daily.   Insulin Pen Needle (PEN NEEDLES 3/16") 31G X 5 MM MISC 1 applicator by Does not apply route 4 (four) times daily.   linaclotide (LINZESS) 145 MCG CAPS capsule Take 1 capsule (145 mcg total) by mouth at bedtime.   linagliptin (TRADJENTA) 5 MG TABS tablet Take 1 tablet (5 mg total) by mouth daily.   melatonin 5 MG TABS Take 5 mg by mouth at bedtime as needed (sleep).   methocarbamol (ROBAXIN) 500 MG tablet Take 1 tablet (500 mg total) by mouth in the morning and at bedtime.   montelukast (SINGULAIR) 10 MG  tablet Take 1 tablet (10 mg total) by mouth at bedtime.   ondansetron (ZOFRAN-ODT) 4 MG disintegrating tablet Take 1 tablet (4 mg total) by mouth every 6 (six) hours as needed for nausea or vomiting.   pantoprazole (PROTONIX) 40 MG tablet Take 1 tablet (40 mg total) by mouth 2 (two) times daily.   polyethylene glycol (MIRALAX / GLYCOLAX) 17 g packet Take 17 g by mouth daily as needed for mild constipation.   promethazine-dextromethorphan (PROMETHAZINE-DM) 6.25-15 MG/5ML syrup Take 5 mLs by mouth 4 (four) times daily as needed for cough.   rOPINIRole (REQUIP) 0.5 MG tablet Take 1 tablet (0.5 mg total) by mouth every evening.   torsemide (DEMADEX) 20 MG tablet TAKE 2 & 1/2 (TWO & ONE-HALF) TABLETS BY MOUTH ONCE DAILY   traMADol (ULTRAM) 50 MG tablet Take 1 tablet by mouth twice daily as needed for pain   vitamin B-12 1000 MCG tablet Take 1 tablet (1,000 mcg total) by mouth daily.   warfarin (COUMADIN) 2 MG tablet Take 1 tab po Tuesday ,Thursday ,Friday ad Saturday and Sunday and then take 4 mg rest of week   warfarin (COUMADIN) 4 MG tablet TAKE 1 TABLET BY MOUTH ON MONDAY, WEDNESDAY AND FRIDAY (Patient taking differently: Take 4 mg by mouth See admin instructions. TAKE 1 TABLET BY MOUTH ON MONDAY AND WED)   [DISCONTINUED] amoxicillin-clavulanate (AUGMENTIN) 875-125 MG tablet  Take 1 tablet by mouth 2 (two) times daily.   [DISCONTINUED] oxyCODONE-acetaminophen (PERCOCET/ROXICET) 5-325 MG tablet Take 1 tablet by mouth 2 (two) times daily as needed for severe pain.   No facility-administered encounter medications on file as of 07/31/2023.    Surgical History: Past Surgical History:  Procedure Laterality Date   ABDOMINAL HYSTERECTOMY     APPENDECTOMY     CATARACT EXTRACTION     CHOLECYSTECTOMY     HERNIA REPAIR     right knee replacement     right nephroectomy      Medical History: Past Medical History:  Diagnosis Date   Anemia    Atrial fibrillation (HCC)    Chronic kidney disease     Congestive heart failure (CHF) (HCC)    COPD (chronic obstructive pulmonary disease) (HCC)    Diabetes mellitus without complication (HCC)    GERD (gastroesophageal reflux disease)    Hyperlipidemia    Hypertension    Pneumonia    Pulmonary fibrosis (HCC)     Family History: Family History  Problem Relation Age of Onset   Diabetes Mother    Breast cancer Mother    Heart disease Father    Diabetes Father    Diabetes Sister    Diabetes Brother    Cancer Brother     Social History   Socioeconomic History   Marital status: Widowed    Spouse name: Not on file   Number of children: Not on file   Years of education: Not on file   Highest education level: Not on file  Occupational History   Occupation: retired  Tobacco Use   Smoking status: Former    Current packs/day: 0.00    Types: Cigarettes    Quit date: 08/19/2022    Years since quitting: 0.9   Smokeless tobacco: Never   Tobacco comments:    1 pack daily---quit 1 month   Vaping Use   Vaping status: Never Used  Substance and Sexual Activity   Alcohol use: No   Drug use: No   Sexual activity: Not Currently  Other Topics Concern   Not on file  Social History Narrative   Lives with daughter   Social Determinants of Health   Financial Resource Strain: Not on file  Food Insecurity: No Food Insecurity (08/06/2023)   Hunger Vital Sign    Worried About Running Out of Food in the Last Year: Never true    Ran Out of Food in the Last Year: Never true  Transportation Needs: No Transportation Needs (08/06/2023)   PRAPARE - Administrator, Civil Service (Medical): No    Lack of Transportation (Non-Medical): No  Physical Activity: Not on file  Stress: Not on file  Social Connections: Not on file  Intimate Partner Violence: Not At Risk (08/06/2023)   Humiliation, Afraid, Rape, and Kick questionnaire    Fear of Current or Ex-Partner: No    Emotionally Abused: No    Physically Abused: No    Sexually Abused:  No      Review of Systems  Constitutional:  Negative for chills, fatigue and unexpected weight change.  HENT:  Positive for postnasal drip. Negative for congestion, rhinorrhea, sneezing and sore throat.   Eyes:  Negative for redness.  Respiratory:  Negative for cough, chest tightness and shortness of breath.   Cardiovascular:  Negative for chest pain and palpitations.  Gastrointestinal:  Negative for abdominal pain, constipation, diarrhea, nausea and vomiting.  Genitourinary:  Negative for dysuria and frequency.  Musculoskeletal:  Negative for arthralgias, back pain, joint swelling and neck pain.  Skin:  Negative for rash.  Neurological: Negative.  Negative for tremors and numbness.  Hematological:  Negative for adenopathy. Does not bruise/bleed easily.  Psychiatric/Behavioral:  Negative for behavioral problems (Depression), sleep disturbance and suicidal ideas. The patient is not nervous/anxious.     Vital Signs: BP 124/65   Pulse (!) 108   Temp 98.2 F (36.8 C)   Resp 16   Ht 4\' 5"  (1.346 m)   Wt 158 lb (71.7 kg)   SpO2 97%   BMI 39.55 kg/m    Physical Exam Constitutional:      Appearance: Normal appearance.  HENT:     Head: Normocephalic and atraumatic.     Nose: Nose normal.     Mouth/Throat:     Mouth: Mucous membranes are moist.     Pharynx: No posterior oropharyngeal erythema.  Eyes:     Extraocular Movements: Extraocular movements intact.     Pupils: Pupils are equal, round, and reactive to light.  Cardiovascular:     Pulses: Normal pulses.     Heart sounds: Normal heart sounds.  Pulmonary:     Effort: Pulmonary effort is normal.     Breath sounds: Normal breath sounds.  Musculoskeletal:     Right foot: Normal range of motion.     Left foot: Normal range of motion.  Feet:     Right foot:     Protective Sensation: 2 sites tested.  2 sites sensed.     Skin integrity: Skin integrity normal.     Toenail Condition: Right toenails are normal.     Left  foot:     Protective Sensation: 2 sites tested.  2 sites sensed.     Skin integrity: Skin integrity normal.     Toenail Condition: Left toenails are normal.  Neurological:     General: No focal deficit present.     Mental Status: She is alert.  Psychiatric:        Mood and Affect: Mood normal.        Behavior: Behavior normal.        Assessment/Plan: 1. Chronic combined systolic and diastolic heart failure (HCC) Stable, CXR did nor show any evidence of pneumonia   2. Type 2 diabetes mellitus with stage 5 chronic kidney disease (HCC) Continue current care  - Urine Microalbumin w/creat. ratio  3. ILD (interstitial lung disease) (HCC) Stable   4. Acute on chronic anemia Will need to see Hematology, most likley anemia of CKD  - Ambulatory referral to Hematology / Oncology - Ambulatory referral to Hematology / Oncology  5. Legally blind Does not need eye exam   6. Long term (current) use of anticoagulants Atrial fib well controlled  - POCT INR   General Counseling: [name] verbalizes understanding of the findings of todays visit and agrees with plan of treatment. I have discussed any further diagnostic evaluation that may be needed or ordered today. We also reviewed her medications today. she has been encouraged to call the office with any questions or concerns that should arise related to todays visit.    Orders Placed This Encounter  Procedures   Urine Microalbumin w/creat. ratio   Ambulatory referral to Hematology / Oncology   POCT INR    No orders of the defined types were placed in this encounter.   Total time spent:45 Minutes Time spent includes review of chart, medications, test results, and follow up plan with the patient.  [place] Controlled Substance Database was reviewed by me.   Dr Lyndon Code Internal medicine

## 2023-08-01 LAB — MICROALBUMIN / CREATININE URINE RATIO
Creatinine, Urine: 96.2 mg/dL
Microalb/Creat Ratio: 1520 mg/g{creat} — ABNORMAL HIGH (ref 0–29)
Microalbumin, Urine: 1461.9 ug/mL

## 2023-08-02 ENCOUNTER — Telehealth: Payer: Self-pay

## 2023-08-02 DIAGNOSIS — Z79899 Other long term (current) drug therapy: Secondary | ICD-10-CM

## 2023-08-02 MED ORDER — OXYCODONE-ACETAMINOPHEN 5-325 MG PO TABS: 1.0000 | ORAL_TABLET | Freq: Two times a day (BID) | ORAL | 0 refills | Status: DC | PRN

## 2023-08-02 NOTE — Telephone Encounter (Signed)
[name] that we sent med 

## 2023-08-06 ENCOUNTER — Inpatient Hospital Stay: Payer: Medicare HMO | Attending: Oncology | Admitting: Oncology

## 2023-08-06 ENCOUNTER — Encounter: Payer: Self-pay | Admitting: Oncology

## 2023-08-06 ENCOUNTER — Inpatient Hospital Stay: Payer: Medicare HMO

## 2023-08-06 ENCOUNTER — Other Ambulatory Visit: Payer: Self-pay

## 2023-08-06 VITALS — BP 98/45 | HR 107 | Temp 97.5°F | Resp 16 | Ht <= 58 in | Wt 158.5 lb

## 2023-08-06 DIAGNOSIS — Z79899 Other long term (current) drug therapy: Secondary | ICD-10-CM | POA: Insufficient documentation

## 2023-08-06 DIAGNOSIS — N184 Chronic kidney disease, stage 4 (severe): Secondary | ICD-10-CM

## 2023-08-06 DIAGNOSIS — D631 Anemia in chronic kidney disease: Secondary | ICD-10-CM | POA: Insufficient documentation

## 2023-08-06 DIAGNOSIS — N189 Chronic kidney disease, unspecified: Secondary | ICD-10-CM | POA: Diagnosis not present

## 2023-08-06 LAB — SAMPLE TO BLOOD BANK

## 2023-08-06 NOTE — Progress Notes (Unsigned)
[facility]  Telephone:([phone] Fax:(336) [phone]  ID: [name] OB: 1941-01-30  MR#: [id number]  AOZ#:[id number]  Patient Care Team: Sallyanne Kuster, NP as PCP - General (Nurse Practitioner) Lyndon Code, MD (Internal Medicine)  CHIEF COMPLAINT: Anemia secondary to chronic renal insufficiency.  INTERVAL HISTORY: Patient is an 82 year old female with longstanding chronic renal insufficiency and anemia was noted to have a declining hemoglobin worse than her baseline.  Much of the history is given by her daughter.  She currently feels well and is asymptomatic.  She does not complain of any weakness or fatigue.  She has a good appetite and denies weight loss.  She has no chest pain, shortness of breath, cough, or hemoptysis.  She denies any nausea, vomiting, constipation, or diarrhea.  She has no melena or hematochezia.  She has no urinary complaints.  Patient feels at her baseline and offers no specific complaints today.  REVIEW OF SYSTEMS:   Review of Systems  Constitutional: Negative.  Negative for fever, malaise/fatigue and weight loss.  Respiratory: Negative.  Negative for cough, hemoptysis and shortness of breath.   Cardiovascular: Negative.  Negative for chest pain and leg swelling.  Gastrointestinal: Negative.  Negative for abdominal pain, blood in stool and melena.  Genitourinary: Negative.  Negative for hematuria.  Musculoskeletal: Negative.  Negative for back pain.  Skin: Negative.  Negative for rash.  Neurological: Negative.  Negative for dizziness, focal weakness, weakness and headaches.  Psychiatric/Behavioral: Negative.  The patient is not nervous/anxious.     As per HPI. Otherwise, a complete review of systems is negative.  PAST MEDICAL HISTORY: Past Medical History:  Diagnosis Date   Anemia    Atrial fibrillation (HCC)    Chronic kidney disease    Congestive heart failure (CHF) (HCC)    COPD (chronic obstructive pulmonary disease)  (HCC)    Diabetes mellitus without complication (HCC)    GERD (gastroesophageal reflux disease)    Hyperlipidemia    Hypertension    Pneumonia    Pulmonary fibrosis (HCC)     PAST SURGICAL HISTORY: Past Surgical History:  Procedure Laterality Date   ABDOMINAL HYSTERECTOMY     APPENDECTOMY     CATARACT EXTRACTION     CHOLECYSTECTOMY     HERNIA REPAIR     right knee replacement     right nephroectomy      FAMILY HISTORY: Family History  Problem Relation Age of Onset   Diabetes Mother    Breast cancer Mother    Heart disease Father    Diabetes Father    Diabetes Sister    Diabetes Brother    Cancer Brother     ADVANCED DIRECTIVES (Y/N):  N  HEALTH MAINTENANCE: Social History   Tobacco Use   Smoking status: Former    Current packs/day: 0.00    Types: Cigarettes    Quit date: 08/19/2022    Years since quitting: 0.9   Smokeless tobacco: Never   Tobacco comments:    1 pack daily---quit 1 month   Vaping Use   Vaping status: Never Used  Substance Use Topics   Alcohol use: No   Drug use: No     Colonoscopy:  PAP:  Bone density:  Lipid panel:  No Known Allergies  Current Outpatient Medications  Medication Sig Dispense Refill   albuterol (VENTOLIN HFA) 108 (90 Base) MCG/ACT inhaler Inhale 2 puffs into the lungs every 6 (six) hours as needed for wheezing or shortness of breath. 51 each 2  atorvastatin (LIPITOR) 10 MG tablet Take 1 tablet (10 mg total) by mouth every evening. 90 tablet 3   citalopram (CELEXA) 20 MG tablet Take 1 tablet (20 mg total) by mouth daily. 90 tablet 3   donepezil (ARICEPT) 5 MG tablet Take 1 tablet (5 mg total) by mouth every morning. 90 tablet 3   ferrous sulfate 325 (65 FE) MG tablet Take 1 tablet (325 mg total) by mouth daily with breakfast. (Patient taking differently: Take 325 mg by mouth every evening.)  3   guaiFENesin (ROBITUSSIN) 100 MG/5ML liquid Take 5 mLs by mouth every 4 (four) hours as needed for cough or to loosen phlegm.  120 mL 0   insulin glargine (LANTUS) 100 UNIT/ML Solostar Pen Inject 22-24 Units into the skin daily. 15 mL 3   Insulin Pen Needle (PEN NEEDLES 3/16") 31G X 5 MM MISC 1 applicator by Does not apply route 4 (four) times daily. 100 each 1   linaclotide (LINZESS) 145 MCG CAPS capsule Take 1 capsule (145 mcg total) by mouth at bedtime. 90 capsule 0   linagliptin (TRADJENTA) 5 MG TABS tablet Take 1 tablet (5 mg total) by mouth daily. 90 tablet 3   melatonin 5 MG TABS Take 5 mg by mouth at bedtime as needed (sleep).     methocarbamol (ROBAXIN) 500 MG tablet Take 1 tablet (500 mg total) by mouth in the morning and at bedtime. 60 tablet 5   montelukast (SINGULAIR) 10 MG tablet Take 1 tablet (10 mg total) by mouth at bedtime. 90 tablet 3   ondansetron (ZOFRAN-ODT) 4 MG disintegrating tablet Take 1 tablet (4 mg total) by mouth every 6 (six) hours as needed for nausea or vomiting. 20 tablet 0   oxyCODONE-acetaminophen (PERCOCET/ROXICET) 5-325 MG tablet Take 1 tablet by mouth 2 (two) times daily as needed for severe pain. 60 tablet 0   [START ON 08/30/2023] oxyCODONE-acetaminophen (PERCOCET/ROXICET) 5-325 MG tablet Take 1 tablet by mouth 2 (two) times daily as needed for severe pain. 60 tablet 0   pantoprazole (PROTONIX) 40 MG tablet Take 1 tablet (40 mg total) by mouth 2 (two) times daily. 180 tablet 3   polyethylene glycol (MIRALAX / GLYCOLAX) 17 g packet Take 17 g by mouth daily as needed for mild constipation. 14 each 0   promethazine-dextromethorphan (PROMETHAZINE-DM) 6.25-15 MG/5ML syrup Take 5 mLs by mouth 4 (four) times daily as needed for cough. 118 mL 0   rOPINIRole (REQUIP) 0.5 MG tablet Take 1 tablet (0.5 mg total) by mouth every evening. 90 tablet 3   torsemide (DEMADEX) 20 MG tablet TAKE 2 & 1/2 (TWO & ONE-HALF) TABLETS BY MOUTH ONCE DAILY 225 tablet 0   traMADol (ULTRAM) 50 MG tablet Take 1 tablet by mouth twice daily as needed for pain 60 tablet 0   vitamin B-12 1000 MCG tablet Take 1 tablet  (1,000 mcg total) by mouth daily.     warfarin (COUMADIN) 2 MG tablet Take 1 tab po Tuesday ,Thursday ,Friday ad Saturday and Sunday and then take 4 mg rest of week 90 tablet 1   warfarin (COUMADIN) 4 MG tablet TAKE 1 TABLET BY MOUTH ON MONDAY, WEDNESDAY AND FRIDAY (Patient taking differently: Take 4 mg by mouth See admin instructions. TAKE 1 TABLET BY MOUTH ON MONDAY AND WED) 36 tablet 3   No current facility-administered medications for this visit.    OBJECTIVE: Vitals:   08/06/23 1120  BP: (!) 98/45  Pulse: (!) 107  Resp: 16  Temp: (!) 97.5 F (  36.4 C)  SpO2: 98%     Body mass index is 39.67 kg/m.    ECOG FS:0 - Asymptomatic  General: Well-developed, well-nourished, no acute distress. Eyes: Pink conjunctiva, anicteric sclera. HEENT: Normocephalic, moist mucous membranes. Lungs: No audible wheezing or coughing. Heart: Regular rate and rhythm. Abdomen: Soft, nontender, no obvious distention. Musculoskeletal: No edema, cyanosis, or clubbing. Neuro: Alert, answering all questions appropriately. Cranial nerves grossly intact. Skin: No rashes or petechiae noted. Psych: Normal affect. Lymphatics: No cervical, calvicular, axillary or inguinal LAD.   LAB RESULTS:  Lab Results  Component Value Date   NA 141 07/23/2023   K 3.7 07/23/2023   CL 104 07/23/2023   CO2 29 07/23/2023   GLUCOSE 162 (H) 07/23/2023   BUN 36 (H) 07/23/2023   CREATININE 2.14 (H) 07/23/2023   CALCIUM 8.5 (L) 07/23/2023   PROT 7.4 05/21/2023   ALBUMIN 3.1 (L) 05/21/2023   AST 19 05/21/2023   ALT 10 05/21/2023   ALKPHOS 77 05/21/2023   BILITOT 1.0 05/21/2023   GFRNONAA 23 (L) 07/23/2023   GFRAA 24 (L) 12/06/2020    Lab Results  Component Value Date   WBC 9.2 07/24/2023   NEUTROABS 6.3 07/24/2023   HGB 8.3 (L) 07/24/2023   HCT 26.2 (L) 07/24/2023   MCV 96.3 07/24/2023   PLT 242 07/24/2023     STUDIES: US Venous Img Lower Bilateral (DVT)  Result Date: 07/22/2023 CLINICAL DATA:  Lower  extremity cramping and spasm. Pain and swelling. EXAM: BILATERAL LOWER EXTREMITY VENOUS DOPPLER ULTRASOUND TECHNIQUE: Gray-scale sonography with graded compression, as well as color Doppler and duplex ultrasound were performed to evaluate the lower extremity deep venous systems from the level of the common femoral vein and including the common femoral, femoral, profunda femoral, popliteal and calf veins including the posterior tibial, peroneal and gastrocnemius veins when visible. The superficial great saphenous vein was also interrogated. Spectral Doppler was utilized to evaluate flow at rest and with distal augmentation maneuvers in the common femoral, femoral and popliteal veins. COMPARISON:  None Available. FINDINGS: RIGHT LOWER EXTREMITY Common Femoral Vein: No evidence of thrombus. Normal compressibility, respiratory phasicity and response to augmentation. Saphenofemoral Junction: No evidence of thrombus. Normal compressibility and flow on color Doppler imaging. Profunda Femoral Vein: No evidence of thrombus. Normal compressibility and flow on color Doppler imaging. Femoral Vein: No evidence of thrombus. Normal compressibility, respiratory phasicity and response to augmentation. Popliteal Vein: No evidence of thrombus. Normal compressibility, respiratory phasicity and response to augmentation. Calf Veins: No evidence of thrombus. Normal compressibility and flow on color Doppler imaging. Superficial Great Saphenous Vein: No evidence of thrombus. Normal compressibility. Venous Reflux:  None. Other Findings:  None. LEFT LOWER EXTREMITY Common Femoral Vein: No evidence of thrombus. Normal compressibility, respiratory phasicity and response to augmentation. Saphenofemoral Junction: No evidence of thrombus. Normal compressibility and flow on color Doppler imaging. Profunda Femoral Vein: No evidence of thrombus. Normal compressibility and flow on color Doppler imaging. Femoral Vein: No evidence of thrombus. Normal  compressibility, respiratory phasicity and response to augmentation. Popliteal Vein: No evidence of thrombus. Normal compressibility, respiratory phasicity and response to augmentation. Calf Veins: No evidence of thrombus. Normal compressibility and flow on color Doppler imaging. Superficial Great Saphenous Vein: No evidence of thrombus. Normal compressibility. Venous Reflux:  None. Other Findings:  None. IMPRESSION: No evidence of deep venous thrombosis in either lower extremity. Electronically Signed   By: Paulina Fusi M.D.   On: 07/22/2023 16:09   DG Chest 2 View  Result Date:  07/22/2023 CLINICAL DATA:  Shortness of breath with coughing and wheezing for 6 days. EXAM: CHEST - 2 VIEW COMPARISON:  Radiographs 05/21/2023 and 10/19/2022.  CT 03/10/2021. FINDINGS: The heart size and mediastinal contours are stable. There is aortic atherosclerosis. Patient has chronic lung disease with diffuse septal,, central airway and fissural thickening corresponding with interstitial lung disease on previous CT. The interstitial prominence appears slightly greater today, suggesting possible mild superimposed edema. There are possible trace bilateral pleural effusions. No confluent airspace disease or pneumothorax. The bones appear unchanged with scattered degenerative changes in the spine. IMPRESSION: Chronic interstitial lung disease with possible mild superimposed edema. Possible trace bilateral pleural effusions. No confluent airspace disease. Electronically Signed   By: Carey Bullocks M.D.   On: 07/22/2023 10:22    ASSESSMENT: Anemia secondary to chronic renal insufficiency.  PLAN:    Anemia secondary to chronic renal insufficiency: Patient's most recent hemoglobin was decreased to 8.3.  Have ordered iron stores, B12, folate, and hemolysis labs for completeness and these are pending at time of dictation.  Patient will benefit from 40,000 units Retacrit if her hemoglobin remains below 10.0.  Return later this week for  treatment.  Patient will then return to clinic in 1, 2, and 3 months for laboratory work and continuation of Retacrit.  She would then return to clinic in 4 months with repeat laboratory, further evaluation, and continuation of treatment if needed. Chronic renal insufficiency: It appears patient has not seen nephrology in several years.  Continue patient and treatment per primary care.  I spent a total of 45 minutes reviewing chart data, face-to-face evaluation with the patient, counseling and coordination of care as detailed above.   Patient expressed understanding and was in agreement with this plan. She also understands that She can call clinic at any time with any questions, concerns, or complaints.     Jeralyn Ruths, MD   08/06/2023 11:39 AM

## 2023-08-07 ENCOUNTER — Encounter: Payer: Self-pay | Admitting: Oncology

## 2023-08-09 ENCOUNTER — Ambulatory Visit: Payer: Medicare HMO

## 2023-08-09 ENCOUNTER — Inpatient Hospital Stay: Payer: Medicare HMO

## 2023-08-09 ENCOUNTER — Telehealth: Payer: Self-pay | Admitting: *Deleted

## 2023-08-09 DIAGNOSIS — D631 Anemia in chronic kidney disease: Secondary | ICD-10-CM | POA: Diagnosis not present

## 2023-08-09 DIAGNOSIS — Z79899 Other long term (current) drug therapy: Secondary | ICD-10-CM | POA: Diagnosis not present

## 2023-08-09 DIAGNOSIS — N189 Chronic kidney disease, unspecified: Secondary | ICD-10-CM | POA: Diagnosis not present

## 2023-08-09 LAB — CBC (CANCER CENTER ONLY)
HCT: 29.5 % — ABNORMAL LOW (ref 36.0–46.0)
Hemoglobin: 9.3 g/dL — ABNORMAL LOW (ref 12.0–15.0)
MCH: 30.2 pg (ref 26.0–34.0)
MCHC: 31.5 g/dL (ref 30.0–36.0)
MCV: 95.8 fL (ref 80.0–100.0)
Platelet Count: 241 10*3/uL (ref 150–400)
RBC: 3.08 MIL/uL — ABNORMAL LOW (ref 3.87–5.11)
RDW: 14.3 % (ref 11.5–15.5)
WBC Count: 9.3 10*3/uL (ref 4.0–10.5)
nRBC: 0 % (ref 0.0–0.2)

## 2023-08-09 LAB — IRON AND TIBC
Iron: 26 ug/dL — ABNORMAL LOW (ref 28–170)
Saturation Ratios: 8 % — ABNORMAL LOW (ref 10.4–31.8)
TIBC: 333 ug/dL (ref 250–450)
UIBC: 307 ug/dL

## 2023-08-09 LAB — VITAMIN B12: Vitamin B-12: 1459 pg/mL — ABNORMAL HIGH (ref 180–914)

## 2023-08-09 LAB — FERRITIN: Ferritin: 67 ng/mL (ref 11–307)

## 2023-08-09 LAB — FOLATE: Folate: 10.3 ng/mL (ref >5.9)

## 2023-08-09 LAB — LACTATE DEHYDROGENASE: LDH: 140 U/L (ref 98–192)

## 2023-08-09 NOTE — Telephone Encounter (Signed)
Patient came for her lab apt today, but left the clinic w/o getting her injection. I attempted to reach her, but no answer. I left a vm for pt to return our clinic's phone call to determine when she would like to come in for her injection. Hgb 9.3

## 2023-08-10 LAB — ERYTHROPOIETIN: Erythropoietin: 28.6 m[IU]/mL — ABNORMAL HIGH (ref 2.6–18.5)

## 2023-08-10 LAB — HAPTOGLOBIN: Haptoglobin: 142 mg/dL (ref 41–333)

## 2023-08-14 ENCOUNTER — Emergency Department: Payer: Medicare HMO

## 2023-08-14 ENCOUNTER — Encounter: Payer: Self-pay | Admitting: Emergency Medicine

## 2023-08-14 ENCOUNTER — Other Ambulatory Visit: Payer: Self-pay

## 2023-08-14 ENCOUNTER — Inpatient Hospital Stay
Admission: EM | Admit: 2023-08-14 | Discharge: 2023-08-24 | DRG: 535 | Disposition: A | Discharge status: Skilled Nursing Facility | Payer: Medicare HMO | Attending: Internal Medicine | Admitting: Internal Medicine

## 2023-08-14 DIAGNOSIS — S3991XA Unspecified injury of abdomen, initial encounter: Secondary | ICD-10-CM | POA: Diagnosis not present

## 2023-08-14 DIAGNOSIS — Z7901 Long term (current) use of anticoagulants: Secondary | ICD-10-CM | POA: Diagnosis not present

## 2023-08-14 DIAGNOSIS — Z79899 Other long term (current) drug therapy: Secondary | ICD-10-CM | POA: Diagnosis not present

## 2023-08-14 DIAGNOSIS — E785 Hyperlipidemia, unspecified: Secondary | ICD-10-CM | POA: Diagnosis present

## 2023-08-14 DIAGNOSIS — N189 Chronic kidney disease, unspecified: Secondary | ICD-10-CM | POA: Diagnosis present

## 2023-08-14 DIAGNOSIS — E1122 Type 2 diabetes mellitus with diabetic chronic kidney disease: Secondary | ICD-10-CM | POA: Diagnosis not present

## 2023-08-14 DIAGNOSIS — H548 Legal blindness, as defined in USA: Secondary | ICD-10-CM | POA: Diagnosis present

## 2023-08-14 DIAGNOSIS — J449 Chronic obstructive pulmonary disease, unspecified: Secondary | ICD-10-CM | POA: Diagnosis not present

## 2023-08-14 DIAGNOSIS — Z7401 Bed confinement status: Secondary | ICD-10-CM | POA: Diagnosis not present

## 2023-08-14 DIAGNOSIS — S0990XA Unspecified injury of head, initial encounter: Secondary | ICD-10-CM | POA: Diagnosis not present

## 2023-08-14 DIAGNOSIS — N289 Disorder of kidney and ureter, unspecified: Secondary | ICD-10-CM | POA: Diagnosis not present

## 2023-08-14 DIAGNOSIS — S72114A Nondisplaced fracture of greater trochanter of right femur, initial encounter for closed fracture: Secondary | ICD-10-CM | POA: Diagnosis not present

## 2023-08-14 DIAGNOSIS — F0390 Unspecified dementia without behavioral disturbance: Secondary | ICD-10-CM | POA: Diagnosis not present

## 2023-08-14 DIAGNOSIS — R0602 Shortness of breath: Secondary | ICD-10-CM | POA: Diagnosis not present

## 2023-08-14 DIAGNOSIS — Z7984 Long term (current) use of oral hypoglycemic drugs: Secondary | ICD-10-CM | POA: Diagnosis not present

## 2023-08-14 DIAGNOSIS — S72114D Nondisplaced fracture of greater trochanter of right femur, subsequent encounter for closed fracture with routine healing: Secondary | ICD-10-CM | POA: Diagnosis not present

## 2023-08-14 DIAGNOSIS — Z87891 Personal history of nicotine dependence: Secondary | ICD-10-CM

## 2023-08-14 DIAGNOSIS — Z6836 Body mass index (BMI) 36.0-36.9, adult: Secondary | ICD-10-CM | POA: Diagnosis not present

## 2023-08-14 DIAGNOSIS — S199XXA Unspecified injury of neck, initial encounter: Secondary | ICD-10-CM | POA: Diagnosis not present

## 2023-08-14 DIAGNOSIS — Y92009 Unspecified place in unspecified non-institutional (private) residence as the place of occurrence of the external cause: Secondary | ICD-10-CM

## 2023-08-14 DIAGNOSIS — I480 Paroxysmal atrial fibrillation: Secondary | ICD-10-CM | POA: Diagnosis not present

## 2023-08-14 DIAGNOSIS — J849 Interstitial pulmonary disease, unspecified: Secondary | ICD-10-CM | POA: Diagnosis not present

## 2023-08-14 DIAGNOSIS — I5033 Acute on chronic diastolic (congestive) heart failure: Secondary | ICD-10-CM | POA: Diagnosis not present

## 2023-08-14 DIAGNOSIS — I4891 Unspecified atrial fibrillation: Secondary | ICD-10-CM | POA: Diagnosis present

## 2023-08-14 DIAGNOSIS — Z8249 Family history of ischemic heart disease and other diseases of the circulatory system: Secondary | ICD-10-CM | POA: Diagnosis not present

## 2023-08-14 DIAGNOSIS — M25551 Pain in right hip: Secondary | ICD-10-CM | POA: Diagnosis not present

## 2023-08-14 DIAGNOSIS — I959 Hypotension, unspecified: Secondary | ICD-10-CM | POA: Diagnosis not present

## 2023-08-14 DIAGNOSIS — R000 Tachycardia, unspecified: Secondary | ICD-10-CM | POA: Diagnosis not present

## 2023-08-14 DIAGNOSIS — S72001A Fracture of unspecified part of neck of right femur, initial encounter for closed fracture: Secondary | ICD-10-CM | POA: Diagnosis not present

## 2023-08-14 DIAGNOSIS — N184 Chronic kidney disease, stage 4 (severe): Secondary | ICD-10-CM | POA: Diagnosis not present

## 2023-08-14 DIAGNOSIS — Z794 Long term (current) use of insulin: Secondary | ICD-10-CM

## 2023-08-14 DIAGNOSIS — M25572 Pain in left ankle and joints of left foot: Secondary | ICD-10-CM | POA: Diagnosis not present

## 2023-08-14 DIAGNOSIS — R918 Other nonspecific abnormal finding of lung field: Secondary | ICD-10-CM | POA: Diagnosis not present

## 2023-08-14 DIAGNOSIS — Z803 Family history of malignant neoplasm of breast: Secondary | ICD-10-CM

## 2023-08-14 DIAGNOSIS — E1165 Type 2 diabetes mellitus with hyperglycemia: Secondary | ICD-10-CM | POA: Diagnosis not present

## 2023-08-14 DIAGNOSIS — Z66 Do not resuscitate: Secondary | ICD-10-CM | POA: Diagnosis present

## 2023-08-14 DIAGNOSIS — I130 Hypertensive heart and chronic kidney disease with heart failure and stage 1 through stage 4 chronic kidney disease, or unspecified chronic kidney disease: Secondary | ICD-10-CM | POA: Diagnosis not present

## 2023-08-14 DIAGNOSIS — N3289 Other specified disorders of bladder: Secondary | ICD-10-CM | POA: Diagnosis not present

## 2023-08-14 DIAGNOSIS — N390 Urinary tract infection, site not specified: Secondary | ICD-10-CM | POA: Diagnosis present

## 2023-08-14 DIAGNOSIS — K219 Gastro-esophageal reflux disease without esophagitis: Secondary | ICD-10-CM | POA: Diagnosis present

## 2023-08-14 DIAGNOSIS — Z833 Family history of diabetes mellitus: Secondary | ICD-10-CM

## 2023-08-14 DIAGNOSIS — J8410 Pulmonary fibrosis, unspecified: Secondary | ICD-10-CM | POA: Diagnosis present

## 2023-08-14 DIAGNOSIS — E871 Hypo-osmolality and hyponatremia: Secondary | ICD-10-CM | POA: Diagnosis present

## 2023-08-14 DIAGNOSIS — E669 Obesity, unspecified: Secondary | ICD-10-CM | POA: Diagnosis present

## 2023-08-14 DIAGNOSIS — N179 Acute kidney failure, unspecified: Secondary | ICD-10-CM | POA: Diagnosis not present

## 2023-08-14 DIAGNOSIS — N178 Other acute kidney failure: Secondary | ICD-10-CM | POA: Diagnosis not present

## 2023-08-14 DIAGNOSIS — I700 Atherosclerosis of aorta: Secondary | ICD-10-CM | POA: Diagnosis not present

## 2023-08-14 DIAGNOSIS — I2510 Atherosclerotic heart disease of native coronary artery without angina pectoris: Secondary | ICD-10-CM | POA: Diagnosis present

## 2023-08-14 DIAGNOSIS — Z90710 Acquired absence of both cervix and uterus: Secondary | ICD-10-CM

## 2023-08-14 DIAGNOSIS — Z79890 Hormone replacement therapy (postmenopausal): Secondary | ICD-10-CM

## 2023-08-14 DIAGNOSIS — R791 Abnormal coagulation profile: Secondary | ICD-10-CM | POA: Diagnosis present

## 2023-08-14 DIAGNOSIS — Z9049 Acquired absence of other specified parts of digestive tract: Secondary | ICD-10-CM

## 2023-08-14 DIAGNOSIS — M79651 Pain in right thigh: Secondary | ICD-10-CM | POA: Diagnosis not present

## 2023-08-14 DIAGNOSIS — M1711 Unilateral primary osteoarthritis, right knee: Secondary | ICD-10-CM | POA: Diagnosis not present

## 2023-08-14 DIAGNOSIS — S72009A Fracture of unspecified part of neck of unspecified femur, initial encounter for closed fracture: Secondary | ICD-10-CM | POA: Diagnosis present

## 2023-08-14 DIAGNOSIS — Z905 Acquired absence of kidney: Secondary | ICD-10-CM

## 2023-08-14 DIAGNOSIS — I509 Heart failure, unspecified: Secondary | ICD-10-CM

## 2023-08-14 DIAGNOSIS — W19XXXA Unspecified fall, initial encounter: Secondary | ICD-10-CM | POA: Diagnosis not present

## 2023-08-14 DIAGNOSIS — Z96651 Presence of right artificial knee joint: Secondary | ICD-10-CM | POA: Diagnosis present

## 2023-08-14 LAB — CBC WITH DIFFERENTIAL/PLATELET
Abs Immature Granulocytes: 0.12 10*3/uL — ABNORMAL HIGH (ref 0.00–0.07)
Basophils Absolute: 0.1 10*3/uL (ref 0.0–0.1)
Basophils Relative: 1 %
Eosinophils Absolute: 0.2 10*3/uL (ref 0.0–0.5)
Eosinophils Relative: 2 %
HCT: 30.8 % — ABNORMAL LOW (ref 36.0–46.0)
Hemoglobin: 9.6 g/dL — ABNORMAL LOW (ref 12.0–15.0)
Immature Granulocytes: 1 %
Lymphocytes Relative: 20 %
Lymphs Abs: 1.9 10*3/uL (ref 0.7–4.0)
MCH: 30.6 pg (ref 26.0–34.0)
MCHC: 31.2 g/dL (ref 30.0–36.0)
MCV: 98.1 fL (ref 80.0–100.0)
Monocytes Absolute: 0.5 10*3/uL (ref 0.1–1.0)
Monocytes Relative: 6 %
Neutro Abs: 6.7 10*3/uL (ref 1.7–7.7)
Neutrophils Relative %: 70 %
Platelets: 226 10*3/uL (ref 150–400)
RBC: 3.14 MIL/uL — ABNORMAL LOW (ref 3.87–5.11)
RDW: 14.5 % (ref 11.5–15.5)
WBC: 9.5 10*3/uL (ref 4.0–10.5)
nRBC: 0 % (ref 0.0–0.2)

## 2023-08-14 LAB — COMPREHENSIVE METABOLIC PANEL
ALT: 12 U/L (ref 0–44)
AST: 22 U/L (ref 15–41)
Albumin: 3 g/dL — ABNORMAL LOW (ref 3.5–5.0)
Alkaline Phosphatase: 75 U/L (ref 38–126)
Anion gap: 12 (ref 5–15)
BUN: 34 mg/dL — ABNORMAL HIGH (ref 8–23)
CO2: 24 mmol/L (ref 22–32)
Calcium: 8.4 mg/dL — ABNORMAL LOW (ref 8.9–10.3)
Chloride: 104 mmol/L (ref 98–111)
Creatinine, Ser: 2.43 mg/dL — ABNORMAL HIGH (ref 0.44–1.00)
GFR, Estimated: 19 mL/min — ABNORMAL LOW (ref >60)
Glucose, Bld: 175 mg/dL — ABNORMAL HIGH (ref 70–99)
Potassium: 4.1 mmol/L (ref 3.5–5.1)
Sodium: 140 mmol/L (ref 135–145)
Total Bilirubin: 0.6 mg/dL (ref 0.3–1.2)
Total Protein: 6.8 g/dL (ref 6.5–8.1)

## 2023-08-14 LAB — PROTIME-INR
INR: 1.7 — ABNORMAL HIGH (ref 0.8–1.2)
Prothrombin Time: 20.2 s — ABNORMAL HIGH (ref 11.4–15.2)

## 2023-08-14 LAB — MAGNESIUM: Magnesium: 1.8 mg/dL (ref 1.7–2.4)

## 2023-08-14 LAB — GLUCOSE, CAPILLARY
Glucose-Capillary: 161 mg/dL — ABNORMAL HIGH (ref 70–99)
Glucose-Capillary: 148 mg/dL — ABNORMAL HIGH (ref 70–99)

## 2023-08-14 LAB — TROPONIN I (HIGH SENSITIVITY)
Troponin I (High Sensitivity): 9 ng/L (ref <18)
Troponin I (High Sensitivity): 9 ng/L (ref <18)

## 2023-08-14 LAB — CBG MONITORING, ED: Glucose-Capillary: 129 mg/dL — ABNORMAL HIGH (ref 70–99)

## 2023-08-14 LAB — BRAIN NATRIURETIC PEPTIDE: B Natriuretic Peptide: 250.4 pg/mL — ABNORMAL HIGH (ref 0.0–100.0)

## 2023-08-14 MED ORDER — PANTOPRAZOLE SODIUM 40 MG PO TBEC
40.0000 mg | DELAYED_RELEASE_TABLET | Freq: Two times a day (BID) | ORAL | Status: DC
  Administered 2023-08-14 – 2023-08-24 (×21): 40 mg via ORAL
  Filled 2023-08-14 (×21): qty 1

## 2023-08-14 MED ORDER — METHOCARBAMOL 500 MG PO TABS: 500.0000 mg | ORAL_TABLET | Freq: Two times a day (BID) | ORAL | Status: DC

## 2023-08-14 MED ORDER — SODIUM CHLORIDE 0.9% FLUSH
3.0000 mL | Freq: Two times a day (BID) | INTRAVENOUS | Status: DC
  Administered 2023-08-14 (×2): 3 mL via INTRAVENOUS

## 2023-08-14 MED ORDER — OXYCODONE-ACETAMINOPHEN 5-325 MG PO TABS
1.0000 | ORAL_TABLET | Freq: Two times a day (BID) | ORAL | Status: DC | PRN
  Administered 2023-08-14 – 2023-08-16 (×4): 1 via ORAL
  Filled 2023-08-14 (×4): qty 1

## 2023-08-14 MED ORDER — ROPINIROLE HCL 1 MG PO TABS
0.5000 mg | ORAL_TABLET | Freq: Every evening | ORAL | Status: DC
  Administered 2023-08-14 – 2023-08-23 (×10): 0.5 mg via ORAL
  Filled 2023-08-14 (×8): qty 1
  Filled 2023-08-14: qty 2
  Filled 2023-08-14 (×2): qty 1

## 2023-08-14 MED ORDER — FERROUS SULFATE 325 (65 FE) MG PO TABS
325.0000 mg | ORAL_TABLET | Freq: Every day | ORAL | Status: DC
  Administered 2023-08-15 – 2023-08-24 (×10): 325 mg via ORAL
  Filled 2023-08-14 (×10): qty 1

## 2023-08-14 MED ORDER — DILTIAZEM HCL 60 MG PO TABS
30.0000 mg | ORAL_TABLET | Freq: Once | ORAL | Status: AC
  Administered 2023-08-14: 30 mg via ORAL
  Filled 2023-08-14: qty 1

## 2023-08-14 MED ORDER — ONDANSETRON HCL 4 MG/2ML IJ SOLN
4.0000 mg | Freq: Four times a day (QID) | INTRAMUSCULAR | Status: DC | PRN
  Administered 2023-08-14: 4 mg via INTRAVENOUS
  Filled 2023-08-14: qty 2

## 2023-08-14 MED ORDER — HYDROMORPHONE HCL 1 MG/ML IJ SOLN
0.5000 mg | INTRAMUSCULAR | Status: DC | PRN
  Administered 2023-08-14 – 2023-08-16 (×6): 0.5 mg via INTRAVENOUS
  Filled 2023-08-14 (×6): qty 0.5

## 2023-08-14 MED ORDER — METOPROLOL TARTRATE 25 MG PO TABS: 25.0000 mg | ORAL_TABLET | Freq: Once | ORAL | Status: DC

## 2023-08-14 MED ORDER — DONEPEZIL HCL 5 MG PO TABS
5.0000 mg | ORAL_TABLET | Freq: Every morning | ORAL | Status: DC
  Administered 2023-08-14 – 2023-08-24 (×11): 5 mg via ORAL
  Filled 2023-08-14 (×11): qty 1

## 2023-08-14 MED ORDER — INSULIN ASPART 100 UNIT/ML IJ SOLN
0.0000 [IU] | Freq: Three times a day (TID) | INTRAMUSCULAR | Status: DC
  Administered 2023-08-14: 3 [IU] via SUBCUTANEOUS
  Administered 2023-08-14 – 2023-08-15 (×2): 2 [IU] via SUBCUTANEOUS
  Administered 2023-08-15 (×2): 3 [IU] via SUBCUTANEOUS
  Administered 2023-08-16: 2 [IU] via SUBCUTANEOUS
  Administered 2023-08-16: 3 [IU] via SUBCUTANEOUS
  Administered 2023-08-17: 2 [IU] via SUBCUTANEOUS
  Administered 2023-08-17: 5 [IU] via SUBCUTANEOUS
  Administered 2023-08-17: 2 [IU] via SUBCUTANEOUS
  Administered 2023-08-18: 3 [IU] via SUBCUTANEOUS
  Administered 2023-08-18: 2 [IU] via SUBCUTANEOUS
  Administered 2023-08-19: 3 [IU] via SUBCUTANEOUS
  Administered 2023-08-19: 2 [IU] via SUBCUTANEOUS
  Administered 2023-08-20: 3 [IU] via SUBCUTANEOUS
  Administered 2023-08-20 – 2023-08-21 (×3): 2 [IU] via SUBCUTANEOUS
  Administered 2023-08-22 – 2023-08-24 (×3): 3 [IU] via SUBCUTANEOUS
  Administered 2023-08-24: 2 [IU] via SUBCUTANEOUS
  Filled 2023-08-14 (×22): qty 1

## 2023-08-14 MED ORDER — PROMETHAZINE-DM 6.25-15 MG/5ML PO SYRP: 5.0000 mL | ORAL_SOLUTION | Freq: Four times a day (QID) | ORAL | Status: DC | PRN

## 2023-08-14 MED ORDER — ACETAMINOPHEN 500 MG PO TABS
1000.0000 mg | ORAL_TABLET | Freq: Once | ORAL | Status: AC
  Administered 2023-08-14: 1000 mg via ORAL
  Filled 2023-08-14: qty 2

## 2023-08-14 MED ORDER — ACETAMINOPHEN 325 MG PO TABS
650.0000 mg | ORAL_TABLET | ORAL | Status: DC | PRN
Start: 2023-08-14 — End: 2023-08-24
  Administered 2023-08-15 – 2023-08-19 (×5): 650 mg via ORAL
  Filled 2023-08-14 (×5): qty 2

## 2023-08-14 MED ORDER — MONTELUKAST SODIUM 10 MG PO TABS
10.0000 mg | ORAL_TABLET | Freq: Every day | ORAL | Status: DC
  Administered 2023-08-14 – 2023-08-23 (×10): 10 mg via ORAL
  Filled 2023-08-14 (×10): qty 1

## 2023-08-14 MED ORDER — SODIUM CHLORIDE 0.9% FLUSH
10.0000 mL | Freq: Two times a day (BID) | INTRAVENOUS | Status: DC
  Administered 2023-08-14 (×2): 10 mL via INTRAVENOUS

## 2023-08-14 MED ORDER — WARFARIN SODIUM 3 MG PO TABS
3.0000 mg | ORAL_TABLET | Freq: Once | ORAL | Status: AC
  Administered 2023-08-14: 3 mg via ORAL
  Filled 2023-08-14: qty 1

## 2023-08-14 MED ORDER — LINAGLIPTIN 5 MG PO TABS
5.0000 mg | ORAL_TABLET | Freq: Every day | ORAL | Status: DC
  Administered 2023-08-14 – 2023-08-19 (×6): 5 mg via ORAL
  Filled 2023-08-14 (×7): qty 1

## 2023-08-14 MED ORDER — WARFARIN - PHARMACIST DOSING INPATIENT
Freq: Every day | Status: DC
  Filled 2023-08-14: qty 1

## 2023-08-14 MED ORDER — DIGOXIN 0.25 MG/ML IJ SOLN
0.1250 mg | Freq: Once | INTRAMUSCULAR | Status: AC
  Administered 2023-08-14: 0.125 mg via INTRAVENOUS
  Filled 2023-08-14: qty 2

## 2023-08-14 MED ORDER — METOPROLOL TARTRATE 5 MG/5ML IV SOLN
2.5000 mg | INTRAVENOUS | Status: DC | PRN
  Administered 2023-08-17 – 2023-08-19 (×4): 2.5 mg via INTRAVENOUS
  Filled 2023-08-14 (×5): qty 5

## 2023-08-14 MED ORDER — LIDOCAINE 5 % EX PTCH
1.0000 | MEDICATED_PATCH | CUTANEOUS | Status: DC
  Administered 2023-08-14 – 2023-08-24 (×11): 1 via TRANSDERMAL
  Filled 2023-08-14 (×11): qty 1

## 2023-08-14 MED ORDER — ONDANSETRON 4 MG PO TBDP: 4.0000 mg | ORAL_TABLET | Freq: Four times a day (QID) | ORAL | Status: DC | PRN

## 2023-08-14 MED ORDER — INFLUENZA VAC A&B SURF ANT ADJ 0.5 ML IM SUSY
0.5000 mL | PREFILLED_SYRINGE | INTRAMUSCULAR | Status: DC
  Filled 2023-08-14: qty 0.5

## 2023-08-14 MED ORDER — ATORVASTATIN CALCIUM 10 MG PO TABS
10.0000 mg | ORAL_TABLET | Freq: Every evening | ORAL | Status: DC
  Administered 2023-08-14 – 2023-08-23 (×10): 10 mg via ORAL
  Filled 2023-08-14 (×10): qty 1

## 2023-08-14 MED ORDER — FUROSEMIDE 10 MG/ML IJ SOLN
20.0000 mg | Freq: Two times a day (BID) | INTRAMUSCULAR | Status: DC
  Administered 2023-08-14 – 2023-08-15 (×3): 20 mg via INTRAVENOUS
  Filled 2023-08-14 (×3): qty 4

## 2023-08-14 MED ORDER — ALBUTEROL SULFATE (2.5 MG/3ML) 0.083% IN NEBU: 3.0000 mL | INHALATION_SOLUTION | Freq: Four times a day (QID) | RESPIRATORY_TRACT | Status: DC | PRN

## 2023-08-14 MED ORDER — CITALOPRAM HYDROBROMIDE 10 MG PO TABS
20.0000 mg | ORAL_TABLET | Freq: Every day | ORAL | Status: DC
  Administered 2023-08-14 – 2023-08-24 (×11): 20 mg via ORAL
  Filled 2023-08-14 (×5): qty 2
  Filled 2023-08-14: qty 1
  Filled 2023-08-14 (×5): qty 2

## 2023-08-14 MED ORDER — POLYETHYLENE GLYCOL 3350 17 G PO PACK
17.0000 g | PACK | Freq: Every day | ORAL | Status: DC | PRN
  Administered 2023-08-14 – 2023-08-16 (×3): 17 g via ORAL
  Filled 2023-08-14 (×3): qty 1

## 2023-08-14 MED ORDER — GUAIFENESIN 100 MG/5ML PO LIQD: 5.0000 mL | ORAL | Status: DC | PRN

## 2023-08-14 MED ORDER — SODIUM CHLORIDE 0.9% FLUSH: 3.0000 mL | INTRAVENOUS | Status: DC | PRN

## 2023-08-14 MED ORDER — OXYCODONE HCL 5 MG PO TABS
2.5000 mg | ORAL_TABLET | Freq: Once | ORAL | Status: AC
  Administered 2023-08-14: 2.5 mg via ORAL
  Filled 2023-08-14: qty 1

## 2023-08-14 MED ORDER — LINACLOTIDE 145 MCG PO CAPS
145.0000 ug | ORAL_CAPSULE | Freq: Every day | ORAL | Status: DC
  Administered 2023-08-14 – 2023-08-23 (×10): 145 ug via ORAL
  Filled 2023-08-14 (×10): qty 1

## 2023-08-14 NOTE — Progress Notes (Signed)
Call placed to [facility] [employer] for abduction brace.  Will be delivered to room.

## 2023-08-14 NOTE — Consult Note (Signed)
ORTHOPAEDIC CONSULTATION  REQUESTING PHYSICIAN: Emeline General, MD  Chief Complaint:   Right greater trochanter avulsion fracture  History of Present Illness: [name] is a 82 y.o. female with medical history significant of PAF on warfarin, off rate control medications, due to persistent bradycardia, COPD, CKD stage IV, IDDM, HTN, HLD, brought in by family member after a mechanical fall.  Per patient's daughter who is at bedside she was going to sit down on her bed and missed the bed landing on the floor on her right hip.  The patient is clinically blind per daughter and uses a rolling walker and cane for ambulation.  She tried to get up to walk after the fall and had difficulty due to pain in her right hip and was brought to the ER for evaluation.  She denies any head strike or loss of consciousness.  Currently denies any shortness of breath or chest pain.  Denies any pre-existing pain in the hip prior to the fall.  Past Medical History:  Diagnosis Date   Anemia    Atrial fibrillation (HCC)    Chronic kidney disease    Congestive heart failure (CHF) (HCC)    COPD (chronic obstructive pulmonary disease) (HCC)    Diabetes mellitus without complication (HCC)    GERD (gastroesophageal reflux disease)    Hyperlipidemia    Hypertension    Pneumonia    Pulmonary fibrosis (HCC)    Past Surgical History:  Procedure Laterality Date   ABDOMINAL HYSTERECTOMY     APPENDECTOMY     CATARACT EXTRACTION     CHOLECYSTECTOMY     HERNIA REPAIR     right knee replacement     right nephroectomy     Social History   Socioeconomic History   Marital status: Widowed    Spouse name: Not on file   Number of children: Not on file   Years of education: Not on file   Highest education level: Not on file  Occupational History   Occupation: retired  Tobacco Use   Smoking status: Former    Current packs/day: 0.00    Types: Cigarettes     Quit date: 08/19/2022    Years since quitting: 0.9   Smokeless tobacco: Never   Tobacco comments:    1 pack daily---quit 1 month   Vaping Use   Vaping status: Never Used  Substance and Sexual Activity   Alcohol use: No   Drug use: No   Sexual activity: Not Currently  Other Topics Concern   Not on file  Social History Narrative   Lives with daughter   Social Determinants of Health   Financial Resource Strain: Not on file  Food Insecurity: No Food Insecurity (08/06/2023)   Hunger Vital Sign    Worried About Running Out of Food in the Last Year: Never true    Ran Out of Food in the Last Year: Never true  Transportation Needs: No Transportation Needs (08/06/2023)   PRAPARE - Administrator, Civil Service (Medical): No    Lack of Transportation (Non-Medical): No  Physical Activity: Not on file  Stress: Not on file  Social Connections: Not on file   Family History  Problem Relation Age of Onset   Diabetes Mother    Breast cancer Mother    Heart disease Father    Diabetes Father    Diabetes Sister    Diabetes Brother    Cancer Brother    No Known Allergies Prior to Admission medications  Medication Sig Start Date End Date Taking? Authorizing Provider  albuterol (VENTOLIN HFA) 108 (90 Base) MCG/ACT inhaler Inhale 2 puffs into the lungs every 6 (six) hours as needed for wheezing or shortness of breath. 05/23/23  Yes Sreeram, Lynne Logan, MD  atorvastatin (LIPITOR) 10 MG tablet Take 1 tablet (10 mg total) by mouth every evening. 04/09/23  Yes Abernathy, Arlyss Repress, NP  citalopram (CELEXA) 20 MG tablet Take 1 tablet (20 mg total) by mouth daily. 04/09/23  Yes Abernathy, Alyssa, NP  donepezil (ARICEPT) 5 MG tablet Take 1 tablet (5 mg total) by mouth every morning. 04/09/23  Yes Abernathy, Arlyss Repress, NP  ferrous sulfate 325 (65 FE) MG tablet Take 1 tablet (325 mg total) by mouth daily with breakfast. Patient taking differently: Take 325 mg by mouth every evening. 12/30/20   Yes Gillis Santa, MD  guaiFENesin (ROBITUSSIN) 100 MG/5ML liquid Take 5 mLs by mouth every 4 (four) hours as needed for cough or to loosen phlegm. 05/23/23  Yes Marcelino Duster, MD  insulin glargine (LANTUS) 100 UNIT/ML Solostar Pen Inject 22-24 Units into the skin daily. 05/24/23  Yes Abernathy, Arlyss Repress, NP  linaclotide (LINZESS) 145 MCG CAPS capsule Take 1 capsule (145 mcg total) by mouth at bedtime. 07/10/23  Yes Abernathy, Arlyss Repress, NP  linagliptin (TRADJENTA) 5 MG TABS tablet Take 1 tablet (5 mg total) by mouth daily. 04/09/23  Yes Abernathy, Alyssa, NP  melatonin 5 MG TABS Take 5 mg by mouth at bedtime as needed (sleep).   Yes [provider]  montelukast (SINGULAIR) 10 MG tablet Take 1 tablet (10 mg total) by mouth at bedtime. 07/10/23  Yes Abernathy, Arlyss Repress, NP  oxyCODONE-acetaminophen (PERCOCET/ROXICET) 5-325 MG tablet Take 1 tablet by mouth 2 (two) times daily as needed for severe pain. 08/02/23  Yes Abernathy, Arlyss Repress, NP  pantoprazole (PROTONIX) 40 MG tablet Take 1 tablet (40 mg total) by mouth 2 (two) times daily. 07/10/23  Yes Abernathy, Arlyss Repress, NP  polyethylene glycol (MIRALAX / GLYCOLAX) 17 g packet Take 17 g by mouth daily as needed for mild constipation. 07/23/23  Yes Loyce Dys, MD  promethazine-dextromethorphan (PROMETHAZINE-DM) 6.25-15 MG/5ML syrup Take 5 mLs by mouth 4 (four) times daily as needed for cough. 07/10/23  Yes Abernathy, Arlyss Repress, NP  rOPINIRole (REQUIP) 0.5 MG tablet Take 1 tablet (0.5 mg total) by mouth every evening. 04/09/23  Yes Abernathy, Alyssa, NP  torsemide (DEMADEX) 20 MG tablet TAKE 2 & 1/2 (TWO & ONE-HALF) TABLETS BY MOUTH ONCE DAILY 07/25/23  Yes Abernathy, Arlyss Repress, NP  traMADol (ULTRAM) 50 MG tablet Take 1 tablet by mouth twice daily as needed for pain 07/25/23  Yes Abernathy, Alyssa, NP  vitamin B-12 1000 MCG tablet Take 1 tablet (1,000 mcg total) by mouth daily. 12/30/20  Yes Gillis Santa, MD  warfarin (COUMADIN) 2 MG tablet Take 1 tab po Tuesday  ,Thursday ,Friday ad Saturday and Sunday and then take 4 mg rest of week Patient taking differently: Take 2 mg by mouth. Take 1 tab po Tuesday ,Thursday, Saturday and Sunday 07/23/23 08/23/23 Yes Djan, Scarlette Calico, MD  warfarin (COUMADIN) 4 MG tablet TAKE 1 TABLET BY MOUTH ON MONDAY, [facility] AND FRIDAY Patient taking differently: Take 4 mg by mouth See admin instructions. TAKE 1 TABLET BY MOUTH ON MONDAY AND WED 12/25/22  Yes Abernathy, Alyssa, NP  Insulin Pen Needle (PEN NEEDLES 3/16") 31G X 5 MM MISC 1 applicator by Does not apply route 4 (four) times daily. 04/09/23   Sallyanne Kuster, NP  methocarbamol (ROBAXIN) 500 MG tablet  Take 1 tablet (500 mg total) by mouth in the morning and at bedtime. Patient not taking: Reported on 08/14/2023 05/29/23   Sallyanne Kuster, NP  ondansetron (ZOFRAN-ODT) 4 MG disintegrating tablet Take 1 tablet (4 mg total) by mouth every 6 (six) hours as needed for nausea or vomiting. Patient not taking: Reported on 08/14/2023 11/02/22   Sallyanne Kuster, NP  oxyCODONE-acetaminophen (PERCOCET/ROXICET) 5-325 MG tablet Take 1 tablet by mouth 2 (two) times daily as needed for severe pain. 08/30/23   Sallyanne Kuster, NP   CT ABDOMEN PELVIS WO CONTRAST  Addendum Date: 08/14/2023   ADDENDUM REPORT: 08/14/2023 10:22 ADDENDUM: On further review, there is an acute, nondisplaced fracture of the right greater trochanter (coronal image 67 series 6). These results were discussed by telephone at the time of interpretation on 08/14/2023 at 10:21 am with provider [facility] , who verbally acknowledged these results. Electronically Signed   By: Orvan Falconer M.D.   On: 08/14/2023 10:22   Result Date: 08/14/2023 CLINICAL DATA:  Abdominal trauma, blunt.  Right hip pain. EXAM: CT ABDOMEN AND PELVIS WITHOUT CONTRAST TECHNIQUE: Multidetector CT imaging of the abdomen and pelvis was performed following the standard protocol without IV contrast. RADIATION DOSE REDUCTION: This exam was performed  according to the departmental dose-optimization program which includes automated exposure control, adjustment of the mA and/or kV according to patient size and/or use of iterative reconstruction technique. COMPARISON:  CT abdomen/pelvis 11/02/2022. FINDINGS: Lower chest: Mild interlobular septal thickening with associated patchy ground-glass opacities and trace bilateral pleural effusions, possibly reflecting mild pulmonary edema. Coronary artery calcifications. Hepatobiliary: No focal liver abnormality is seen. Status post cholecystectomy. No biliary dilatation. Pancreas: Unremarkable. No pancreatic ductal dilatation or surrounding inflammatory changes. Spleen: Normal in size without focal abnormality. Adrenals/Urinary Tract: Adrenal glands are unremarkable. Prior right nephrectomy. Unchanged dilation and dense calcifications of the mid and distal right ureter. Left kidney is unremarkable. No hydronephrosis or calculi. Bladder is unremarkable for degree of distention. Stomach/Bowel: Small hiatal hernia. No dilated loops of small bowel. Appendix is not visualized. Colon is unremarkable. No bowel wall thickening or surrounding inflammation. Vascular/Lymphatic: Aortic atherosclerosis. No enlarged abdominal or pelvic lymph nodes. Reproductive: Status post hysterectomy. No adnexal masses. Other: Unchanged rectus diastasis and atrophy of the right abdominal musculature. Musculoskeletal: No acute or significant osseous findings. IMPRESSION: 1. No acute traumatic injury in the abdomen or pelvis. 2. Mild interlobular septal thickening with associated patchy ground-glass opacities and trace bilateral pleural effusions, possibly reflecting mild pulmonary edema. 3. Prior right nephrectomy. Unchanged dilation and dense calcifications of the mid and distal right ureter. Aortic Atherosclerosis (ICD10-I70.0). Electronically Signed: By: Orvan Falconer M.D. On: 08/14/2023 09:55   CT HEAD WO CONTRAST ( )  Result Date:  08/14/2023 CLINICAL DATA:  Neck trauma (Age >= 65y); Head trauma, minor (Age >= 65y). Fall. EXAM: CT HEAD WITHOUT CONTRAST CT CERVICAL SPINE WITHOUT CONTRAST TECHNIQUE: Multidetector CT imaging of the head and cervical spine was performed following the standard protocol without intravenous contrast. Multiplanar CT image reconstructions of the cervical spine were also generated. RADIATION DOSE REDUCTION: This exam was performed according to the departmental dose-optimization program which includes automated exposure control, adjustment of the mA and/or kV according to patient size and/or use of iterative reconstruction technique. COMPARISON:  CT head and cervical spine 11/30/2021. FINDINGS: CT HEAD FINDINGS Brain: No acute hemorrhage. Unchanged mild chronic small-vessel disease. Cortical gray-white differentiation is otherwise preserved. Prominence of the ventricles and sulci within expected range for age. No hydrocephalus or extra-axial collection.  No mass effect or midline shift. Vascular: No hyperdense vessel or unexpected calcification. Skull: No calvarial fracture or suspicious bone lesion. Skull base is unremarkable. Sinuses/Orbits: No acute finding. Other: None. CT CERVICAL SPINE FINDINGS Alignment: Unchanged trace anterolisthesis of C4 on C5. No traumatic malalignment. Skull base and vertebrae: No acute fracture. Normal craniocervical junction. No suspicious bone lesions. Soft tissues and spinal canal: No prevertebral fluid or swelling. No visible canal hematoma. Disc levels: Mild cervical spondylosis without high-grade spinal canal stenosis. Upper chest: No acute findings. Other: None. IMPRESSION: 1. No acute intracranial abnormality. 2. No acute cervical spine fracture or traumatic malalignment. Electronically Signed   By: Orvan Falconer M.D.   On: 08/14/2023 09:48   CT Cervical Spine Wo Contrast  Result Date: 08/14/2023 CLINICAL DATA:  Neck trauma (Age >= 65y); Head trauma, minor (Age >= 65y). Fall.  EXAM: CT HEAD WITHOUT CONTRAST CT CERVICAL SPINE WITHOUT CONTRAST TECHNIQUE: Multidetector CT imaging of the head and cervical spine was performed following the standard protocol without intravenous contrast. Multiplanar CT image reconstructions of the cervical spine were also generated. RADIATION DOSE REDUCTION: This exam was performed according to the departmental dose-optimization program which includes automated exposure control, adjustment of the mA and/or kV according to patient size and/or use of iterative reconstruction technique. COMPARISON:  CT head and cervical spine 11/30/2021. FINDINGS: CT HEAD FINDINGS Brain: No acute hemorrhage. Unchanged mild chronic small-vessel disease. Cortical gray-white differentiation is otherwise preserved. Prominence of the ventricles and sulci within expected range for age. No hydrocephalus or extra-axial collection. No mass effect or midline shift. Vascular: No hyperdense vessel or unexpected calcification. Skull: No calvarial fracture or suspicious bone lesion. Skull base is unremarkable. Sinuses/Orbits: No acute finding. Other: None. CT CERVICAL SPINE FINDINGS Alignment: Unchanged trace anterolisthesis of C4 on C5. No traumatic malalignment. Skull base and vertebrae: No acute fracture. Normal craniocervical junction. No suspicious bone lesions. Soft tissues and spinal canal: No prevertebral fluid or swelling. No visible canal hematoma. Disc levels: Mild cervical spondylosis without high-grade spinal canal stenosis. Upper chest: No acute findings. Other: None. IMPRESSION: 1. No acute intracranial abnormality. 2. No acute cervical spine fracture or traumatic malalignment. Electronically Signed   By: Orvan Falconer M.D.   On: 08/14/2023 09:48   DG Pelvis 1-2 Views  Result Date: 08/14/2023 CLINICAL DATA:  Patient fell on floor getting into bed. Right hip pain. EXAM: PELVIS - 1-2 VIEW; RIGHT FEMUR 2 VIEWS COMPARISON:  CT abdomen and pelvis 01/11/2024a; contemporaneous CT  abdomen and pelvis 08/14/2023 FINDINGS: There is diffuse decreased bone mineralization. Mild bilateral superomedial femoroacetabular joint space narrowing. Moderate pubic symphysis joint space narrowing and peripheral osteophytosis. Mild bilateral sacroiliac joint space narrowing with right-greater-than-left subchondral sclerosis. Moderate right knee medial knee compartment joint space narrowing. Minimal chronic enthesopathic change at the quadriceps insertion on the patella. No joint effusion. There is subtle lucency at the superior aspect of the right greater trochanter corresponding to the small nondisplaced acute fracture better seen on today's CT abdomen and pelvis. Moderate to severe atherosclerotic calcifications. IMPRESSION: 1. Subtle lucency at the superior aspect of the right greater trochanter corresponding to the small nondisplaced acute fracture better seen on today's CT abdomen and pelvis. 2. Mild bilateral femoroacetabular osteoarthritis. 3. Moderate medial right knee compartment osteoarthritis. Electronically Signed   By: Neita Garnet M.D.   On: 08/14/2023 09:39   DG FEMUR, MIN 2 VIEWS RIGHT  Result Date: 08/14/2023 CLINICAL DATA:  Patient fell on floor getting into bed. Right hip pain. EXAM: PELVIS -  1-2 VIEW; RIGHT FEMUR 2 VIEWS COMPARISON:  CT abdomen and pelvis 01/11/2024a; contemporaneous CT abdomen and pelvis 08/14/2023 FINDINGS: There is diffuse decreased bone mineralization. Mild bilateral superomedial femoroacetabular joint space narrowing. Moderate pubic symphysis joint space narrowing and peripheral osteophytosis. Mild bilateral sacroiliac joint space narrowing with right-greater-than-left subchondral sclerosis. Moderate right knee medial knee compartment joint space narrowing. Minimal chronic enthesopathic change at the quadriceps insertion on the patella. No joint effusion. There is subtle lucency at the superior aspect of the right greater trochanter corresponding to the small  nondisplaced acute fracture better seen on today's CT abdomen and pelvis. Moderate to severe atherosclerotic calcifications. IMPRESSION: 1. Subtle lucency at the superior aspect of the right greater trochanter corresponding to the small nondisplaced acute fracture better seen on today's CT abdomen and pelvis. 2. Mild bilateral femoroacetabular osteoarthritis. 3. Moderate medial right knee compartment osteoarthritis. Electronically Signed   By: Neita Garnet M.D.   On: 08/14/2023 09:39    Positive ROS: All other systems have been reviewed and were otherwise negative with the exception of those mentioned in the HPI and as above.  Physical Exam: General:  Alert, no acute distress Psychiatric:  Patient is competent for consent with normal mood and affect   Cardiovascular:  No pedal edema Respiratory:  No wheezing, non-labored breathing GI:  Abdomen is soft and non-tender Skin:  No lesions in the area of chief complaint Neurologic:  Sensation intact distally Lymphatic:  No axillary or cervical lymphadenopathy  Orthopedic Exam:  Right lower extremity Skin intact with tenderness palpation over the lateral hip No tenderness to palpation over the distal femur, knee, tibia, ankle, or foot Neurovascular intact able dorsiflex and plantarflex the foot and toes sensation intact with compartments all soft Intact dorsalis pedis pulse  Mild lateral hip pain with gentle logroll, minimal to no pain with distracted axial load  Secondary survey No tenderness to palpation over other bony prominences in the lower extremities or bilateral upper extremities No pain with logroll or simulated axial loading of the left lower extremity All compartments soft No tenderness to palpation over the cervical or thoracic spine, no bony step-off Motor grossly intact throughout, no focal deficits Sensation grossly intact throughout, no focal deficits Good distal pulses and capillary refill on all extremities   X-rays:   X-rays and CT scans images and report reviewed by myself.  There is a very small nondisplaced avulsion fracture at the tip of the greater trochanter with no evidence of any fracture extension into the intertrochanteric area.  There is a small limitation in interpretation due to the large field-of-view however there is no evidence on any view of fracture extension.  Agree with radiologist interpretation.  Assessment: Tip of the right greater trochanter avulsion fracture  Plan: I reviewed the clinical and radiographic findings with the patient and her daughter.  We reviewed the x-rays and talked about the natural course and history of greater trochanteric fractures the possibility of fractures tension into the intertrochanteric region and displacement.  There is a very small tip of the greater trochanter fracture with no evidence of fracture extension to the intertrochanteric region on the current studies.  We discussed the possibility of doing additional studies to look for extension but given her lack of pain with axial load and after discussing the risks and benefits of potential surgical intervention versus nonoperative management the patient and daughter would like to move forward with nonoperative treatment.  They understand the risk of propagation of the fracture or extension into  the intertrochanteric region especially if she should have another fall.  We discussed the surgery that would be indicated for the fracture should we consider surgery.  At this time the patient and daughter would like to proceed with a nonoperative course of management under a shared decision making model.  We discussed the risk of nonunion, malunion, delayed union, persistent pain, need for future surgery.  All questions were answered and the patient and daughter understand the plan and we will follow this fracture closely with x-rays in 1 week in the office if she is discharged.  She may weight-bear as tolerated on the right  hip and I will order an abduction brace with no active abduction of the right hip.  She must use a rolling walker and an assistant at all times for ambulation.    Reinaldo Berber MD  Beeper #:  [phone]  08/14/2023 1:24 PM

## 2023-08-14 NOTE — ED Triage Notes (Addendum)
EMS brings pt in from home; pt was getting into bed but fell on floor; denies hitting head; c/o rt hip pain; denies any other c/o or injuries; EMS reports pt able to stand to transfer into w/c from stretcher

## 2023-08-14 NOTE — H&P (Addendum)
History and Physical    [name] ZOX:[id number] DOB: 1941-04-07 DOA: 08/14/2023  PCP: Sallyanne Kuster, NP (Confirm with patient/family/NH records and if not entered, this has to be entered at [facility] point of entry) Patient coming from: Home  I have personally briefly reviewed patient's old medical records in [facility]  Chief Complaint: I fell and broke my hip  HPI: [name] is a 82 y.o. female with medical history significant of PAF on warfarin, off rate control medications, due to persistent bradycardia, COPD, CKD stage IV, IDDM, HTN, HLD, brought in by family member after a mechanical fall.  Patient fell on her right side and hit her right hip about sitting down on her bed and she missed the edge of the bed.  Excruciating pain and unable to stand on herself.  Denied any LOC, no head injury.  Denied any prodromes of lightheadedness blurry vision.  Patient has reported palpitations and increasing shortness of breath recently but denied any cough no chest pain no fever or chills.  ED Course: In rapid A-fib heart rate 110s-120s, blood pressure 120/90, O2 saturation 99% on room air.  Trauma scan including CT head and neck negative for acute findings.  CT abdomen pelvis showed right greater trochanter fracture.  Blood work showed creatinine 2.4 compared to baseline 1.9-2.1, K4.1, bicarb 24, hemoglobin 9.6, WBC 9.5.  Review of Systems: As per HPI otherwise 14 point review of systems negative.    Past Medical History:  Diagnosis Date   Anemia    Atrial fibrillation (HCC)    Chronic kidney disease    Congestive heart failure (CHF) (HCC)    COPD (chronic obstructive pulmonary disease) (HCC)    Diabetes mellitus without complication (HCC)    GERD (gastroesophageal reflux disease)    Hyperlipidemia    Hypertension    Pneumonia    Pulmonary fibrosis (HCC)     Past Surgical History:  Procedure Laterality Date   ABDOMINAL HYSTERECTOMY     APPENDECTOMY     CATARACT  EXTRACTION     CHOLECYSTECTOMY     HERNIA REPAIR     right knee replacement     right nephroectomy       reports that she quit smoking about a year ago. Her smoking use included cigarettes. She has never used smokeless tobacco. She reports that she does not drink alcohol and does not use drugs.  No Known Allergies  Family History  Problem Relation Age of Onset   Diabetes Mother    Breast cancer Mother    Heart disease Father    Diabetes Father    Diabetes Sister    Diabetes Brother    Cancer Brother      Prior to Admission medications   Medication Sig Start Date End Date Taking? Authorizing Provider  albuterol (VENTOLIN HFA) 108 (90 Base) MCG/ACT inhaler Inhale 2 puffs into the lungs every 6 (six) hours as needed for wheezing or shortness of breath. 05/23/23  Yes Sreeram, Lynne Logan, MD  atorvastatin (LIPITOR) 10 MG tablet Take 1 tablet (10 mg total) by mouth every evening. 04/09/23  Yes Abernathy, Arlyss Repress, NP  citalopram (CELEXA) 20 MG tablet Take 1 tablet (20 mg total) by mouth daily. 04/09/23  Yes Abernathy, Alyssa, NP  donepezil (ARICEPT) 5 MG tablet Take 1 tablet (5 mg total) by mouth every morning. 04/09/23  Yes Abernathy, Arlyss Repress, NP  ferrous sulfate 325 (65 FE) MG tablet Take 1 tablet (325 mg total) by mouth daily with breakfast. Patient taking  differently: Take 325 mg by mouth every evening. 12/30/20  Yes Gillis Santa, MD  guaiFENesin (ROBITUSSIN) 100 MG/5ML liquid Take 5 mLs by mouth every 4 (four) hours as needed for cough or to loosen phlegm. 05/23/23  Yes Marcelino Duster, MD  insulin glargine (LANTUS) 100 UNIT/ML Solostar Pen Inject 22-24 Units into the skin daily. 05/24/23  Yes Abernathy, Arlyss Repress, NP  linaclotide (LINZESS) 145 MCG CAPS capsule Take 1 capsule (145 mcg total) by mouth at bedtime. 07/10/23  Yes Abernathy, Arlyss Repress, NP  linagliptin (TRADJENTA) 5 MG TABS tablet Take 1 tablet (5 mg total) by mouth daily. 04/09/23  Yes Abernathy, Alyssa, NP  melatonin 5 MG TABS  Take 5 mg by mouth at bedtime as needed (sleep).   Yes [provider]  montelukast (SINGULAIR) 10 MG tablet Take 1 tablet (10 mg total) by mouth at bedtime. 07/10/23  Yes Abernathy, Arlyss Repress, NP  oxyCODONE-acetaminophen (PERCOCET/ROXICET) 5-325 MG tablet Take 1 tablet by mouth 2 (two) times daily as needed for severe pain. 08/02/23  Yes Abernathy, Arlyss Repress, NP  pantoprazole (PROTONIX) 40 MG tablet Take 1 tablet (40 mg total) by mouth 2 (two) times daily. 07/10/23  Yes Abernathy, Arlyss Repress, NP  polyethylene glycol (MIRALAX / GLYCOLAX) 17 g packet Take 17 g by mouth daily as needed for mild constipation. 07/23/23  Yes Loyce Dys, MD  promethazine-dextromethorphan (PROMETHAZINE-DM) 6.25-15 MG/5ML syrup Take 5 mLs by mouth 4 (four) times daily as needed for cough. 07/10/23  Yes Abernathy, Arlyss Repress, NP  rOPINIRole (REQUIP) 0.5 MG tablet Take 1 tablet (0.5 mg total) by mouth every evening. 04/09/23  Yes Abernathy, Alyssa, NP  torsemide (DEMADEX) 20 MG tablet TAKE 2 & 1/2 (TWO & ONE-HALF) TABLETS BY MOUTH ONCE DAILY 07/25/23  Yes Abernathy, Arlyss Repress, NP  traMADol (ULTRAM) 50 MG tablet Take 1 tablet by mouth twice daily as needed for pain 07/25/23  Yes Abernathy, Alyssa, NP  vitamin B-12 1000 MCG tablet Take 1 tablet (1,000 mcg total) by mouth daily. 12/30/20  Yes Gillis Santa, MD  warfarin (COUMADIN) 2 MG tablet Take 1 tab po Tuesday ,Thursday ,Friday ad Saturday and Sunday and then take 4 mg rest of week Patient taking differently: Take 2 mg by mouth. Take 1 tab po Tuesday ,Thursday, Saturday and Sunday 07/23/23 08/23/23 Yes Djan, Scarlette Calico, MD  warfarin (COUMADIN) 4 MG tablet TAKE 1 TABLET BY MOUTH ON MONDAY, [facility] AND FRIDAY Patient taking differently: Take 4 mg by mouth See admin instructions. TAKE 1 TABLET BY MOUTH ON MONDAY AND WED 12/25/22  Yes Abernathy, Alyssa, NP  Insulin Pen Needle (PEN NEEDLES 3/16") 31G X 5 MM MISC 1 applicator by Does not apply route 4 (four) times daily. 04/09/23   Sallyanne Kuster, NP  methocarbamol (ROBAXIN) 500 MG tablet Take 1 tablet (500 mg total) by mouth in the morning and at bedtime. Patient not taking: Reported on 08/14/2023 05/29/23   Sallyanne Kuster, NP  ondansetron (ZOFRAN-ODT) 4 MG disintegrating tablet Take 1 tablet (4 mg total) by mouth every 6 (six) hours as needed for nausea or vomiting. Patient not taking: Reported on 08/14/2023 11/02/22   Sallyanne Kuster, NP  oxyCODONE-acetaminophen (PERCOCET/ROXICET) 5-325 MG tablet Take 1 tablet by mouth 2 (two) times daily as needed for severe pain. 08/30/23   Sallyanne Kuster, NP    Physical Exam: Vitals:   08/14/23 1025 08/14/23 1053 08/14/23 1056 08/14/23 1130  BP: 116/81 120/78  120/69  Pulse: (!) 113 (!) 110  (!) 114  Resp: 19 18  (!) 23  Temp:   98 F (36.7 C)   TempSrc:   Oral   SpO2: 94% 94%  97%  Weight:      Height:        Constitutional: NAD, calm, comfortable Vitals:   08/14/23 1025 08/14/23 1053 08/14/23 1056 08/14/23 1130  BP: 116/81 120/78  120/69  Pulse: (!) 113 (!) 110  (!) 114  Resp: 19 18  (!) 23  Temp:   98 F (36.7 C)   TempSrc:   Oral   SpO2: 94% 94%  97%  Weight:      Height:       Eyes: PERRL, lids and conjunctivae normal ENMT: Mucous membranes are moist. Posterior pharynx clear of any exudate or lesions.Normal dentition.  Neck: normal, supple, no masses, no thyromegaly Respiratory: clear to auscultation bilaterally, no wheezing, fine crackles on bilateral lower fields, increasing respiratory effort. No accessory muscle use.  Cardiovascular: Irregular heart rhythm, tachycardia, no murmurs / rubs / gallops. No extremity edema. 2+ pedal pulses. No carotid bruits.  Abdomen: no tenderness, no masses palpated. No hepatosplenomegaly. Bowel sounds positive.  Musculoskeletal: Right Leg rotated and shortened Skin: no rashes, lesions, ulcers. No induration Neurologic: CN 2-12 grossly intact. Sensation intact, DTR normal. Strength 5/5 in all 4.  Psychiatric: Normal judgment  and insight. Alert and oriented x 3. Normal mood.     Labs on Admission: I have personally reviewed following labs and imaging studies  CBC: Recent Labs  Lab 08/09/23 1426 08/14/23 0808  WBC 9.3 9.5  NEUTROABS  --  6.7  HGB 9.3* 9.6*  HCT 29.5* 30.8*  MCV 95.8 98.1  PLT 241 226   Basic Metabolic Panel: Recent Labs  Lab 08/14/23 0808  NA 140  K 4.1  CL 104  CO2 24  GLUCOSE 175*  BUN 34*  CREATININE 2.43*  CALCIUM 8.4*  MG 1.8   GFR: Estimated Creatinine Clearance: 14.9 mL/min (A) (by C-G formula based on SCr of 2.43 mg/dL (H)). Liver Function Tests: Recent Labs  Lab 08/14/23 0808  AST 22  ALT 12  ALKPHOS 75  BILITOT 0.6  PROT 6.8  ALBUMIN 3.0*   No results for input(s): "LIPASE", "AMYLASE" in the last 168 hours. No results for input(s): "AMMONIA" in the last 168 hours. Coagulation Profile: Recent Labs  Lab 08/14/23 0808  INR 1.7*   Cardiac Enzymes: No results for input(s): "CKTOTAL", "CKMB", "CKMBINDEX", "TROPONINI" in the last 168 hours. BNP (last 3 results) No results for input(s): "PROBNP" in the last 8760 hours. HbA1C: No results for input(s): "HGBA1C" in the last 72 hours. CBG: No results for input(s): "GLUCAP" in the last 168 hours. Lipid Profile: No results for input(s): "CHOL", "HDL", "LDLCALC", "TRIG", "CHOLHDL", "LDLDIRECT" in the last 72 hours. Thyroid Function Tests: No results for input(s): "TSH", "T4TOTAL", "FREET4", "T3FREE", "THYROIDAB" in the last 72 hours. Anemia Panel: No results for input(s): "VITAMINB12", "FOLATE", "FERRITIN", "TIBC", "IRON", "RETICCTPCT" in the last 72 hours. Urine analysis:    Component Value Date/Time   COLORURINE YELLOW (A) 12/23/2020 1040   APPEARANCEUR Clear 04/03/2022 1528   LABSPEC 1.013 12/23/2020 1040   PHURINE 6.0 12/23/2020 1040   GLUCOSEU Negative 04/03/2022 1528   HGBUR MODERATE (A) 12/23/2020 1040   BILIRUBINUR Negative 04/03/2022 1528   KETONESUR NEGATIVE 12/23/2020 1040   PROTEINUR 1+  (A) 04/03/2022 1528   PROTEINUR >=300 (A) 12/23/2020 1040   UROBILINOGEN 0.2 09/02/2019 1441   NITRITE Negative 04/03/2022 1528   NITRITE NEGATIVE 12/23/2020 1040   LEUKOCYTESUR 1+ (A)  04/03/2022 1528   LEUKOCYTESUR NEGATIVE 12/23/2020 1040    Radiological Exams on Admission: CT ABDOMEN PELVIS WO CONTRAST  Addendum Date: 08/14/2023   ADDENDUM REPORT: 08/14/2023 10:22 ADDENDUM: On further review, there is an acute, nondisplaced fracture of the right greater trochanter (coronal image 67 series 6). These results were discussed by telephone at the time of interpretation on 08/14/2023 at 10:21 am with provider [facility] , who verbally acknowledged these results. Electronically Signed   By: Orvan Falconer M.D.   On: 08/14/2023 10:22   Result Date: 08/14/2023 CLINICAL DATA:  Abdominal trauma, blunt.  Right hip pain. EXAM: CT ABDOMEN AND PELVIS WITHOUT CONTRAST TECHNIQUE: Multidetector CT imaging of the abdomen and pelvis was performed following the standard protocol without IV contrast. RADIATION DOSE REDUCTION: This exam was performed according to the departmental dose-optimization program which includes automated exposure control, adjustment of the mA and/or kV according to patient size and/or use of iterative reconstruction technique. COMPARISON:  CT abdomen/pelvis 11/02/2022. FINDINGS: Lower chest: Mild interlobular septal thickening with associated patchy ground-glass opacities and trace bilateral pleural effusions, possibly reflecting mild pulmonary edema. Coronary artery calcifications. Hepatobiliary: No focal liver abnormality is seen. Status post cholecystectomy. No biliary dilatation. Pancreas: Unremarkable. No pancreatic ductal dilatation or surrounding inflammatory changes. Spleen: Normal in size without focal abnormality. Adrenals/Urinary Tract: Adrenal glands are unremarkable. Prior right nephrectomy. Unchanged dilation and dense calcifications of the mid and distal right ureter. Left kidney  is unremarkable. No hydronephrosis or calculi. Bladder is unremarkable for degree of distention. Stomach/Bowel: Small hiatal hernia. No dilated loops of small bowel. Appendix is not visualized. Colon is unremarkable. No bowel wall thickening or surrounding inflammation. Vascular/Lymphatic: Aortic atherosclerosis. No enlarged abdominal or pelvic lymph nodes. Reproductive: Status post hysterectomy. No adnexal masses. Other: Unchanged rectus diastasis and atrophy of the right abdominal musculature. Musculoskeletal: No acute or significant osseous findings. IMPRESSION: 1. No acute traumatic injury in the abdomen or pelvis. 2. Mild interlobular septal thickening with associated patchy ground-glass opacities and trace bilateral pleural effusions, possibly reflecting mild pulmonary edema. 3. Prior right nephrectomy. Unchanged dilation and dense calcifications of the mid and distal right ureter. Aortic Atherosclerosis (ICD10-I70.0). Electronically Signed: By: Orvan Falconer M.D. On: 08/14/2023 09:55   CT HEAD WO CONTRAST ( )  Result Date: 08/14/2023 CLINICAL DATA:  Neck trauma (Age >= 65y); Head trauma, minor (Age >= 65y). Fall. EXAM: CT HEAD WITHOUT CONTRAST CT CERVICAL SPINE WITHOUT CONTRAST TECHNIQUE: Multidetector CT imaging of the head and cervical spine was performed following the standard protocol without intravenous contrast. Multiplanar CT image reconstructions of the cervical spine were also generated. RADIATION DOSE REDUCTION: This exam was performed according to the departmental dose-optimization program which includes automated exposure control, adjustment of the mA and/or kV according to patient size and/or use of iterative reconstruction technique. COMPARISON:  CT head and cervical spine 11/30/2021. FINDINGS: CT HEAD FINDINGS Brain: No acute hemorrhage. Unchanged mild chronic small-vessel disease. Cortical gray-white differentiation is otherwise preserved. Prominence of the ventricles and sulci within  expected range for age. No hydrocephalus or extra-axial collection. No mass effect or midline shift. Vascular: No hyperdense vessel or unexpected calcification. Skull: No calvarial fracture or suspicious bone lesion. Skull base is unremarkable. Sinuses/Orbits: No acute finding. Other: None. CT CERVICAL SPINE FINDINGS Alignment: Unchanged trace anterolisthesis of C4 on C5. No traumatic malalignment. Skull base and vertebrae: No acute fracture. Normal craniocervical junction. No suspicious bone lesions. Soft tissues and spinal canal: No prevertebral fluid or swelling. No visible canal hematoma. Disc levels:  Mild cervical spondylosis without high-grade spinal canal stenosis. Upper chest: No acute findings. Other: None. IMPRESSION: 1. No acute intracranial abnormality. 2. No acute cervical spine fracture or traumatic malalignment. Electronically Signed   By: Orvan Falconer M.D.   On: 08/14/2023 09:48   CT Cervical Spine Wo Contrast  Result Date: 08/14/2023 CLINICAL DATA:  Neck trauma (Age >= 65y); Head trauma, minor (Age >= 65y). Fall. EXAM: CT HEAD WITHOUT CONTRAST CT CERVICAL SPINE WITHOUT CONTRAST TECHNIQUE: Multidetector CT imaging of the head and cervical spine was performed following the standard protocol without intravenous contrast. Multiplanar CT image reconstructions of the cervical spine were also generated. RADIATION DOSE REDUCTION: This exam was performed according to the departmental dose-optimization program which includes automated exposure control, adjustment of the mA and/or kV according to patient size and/or use of iterative reconstruction technique. COMPARISON:  CT head and cervical spine 11/30/2021. FINDINGS: CT HEAD FINDINGS Brain: No acute hemorrhage. Unchanged mild chronic small-vessel disease. Cortical gray-white differentiation is otherwise preserved. Prominence of the ventricles and sulci within expected range for age. No hydrocephalus or extra-axial collection. No mass effect or  midline shift. Vascular: No hyperdense vessel or unexpected calcification. Skull: No calvarial fracture or suspicious bone lesion. Skull base is unremarkable. Sinuses/Orbits: No acute finding. Other: None. CT CERVICAL SPINE FINDINGS Alignment: Unchanged trace anterolisthesis of C4 on C5. No traumatic malalignment. Skull base and vertebrae: No acute fracture. Normal craniocervical junction. No suspicious bone lesions. Soft tissues and spinal canal: No prevertebral fluid or swelling. No visible canal hematoma. Disc levels: Mild cervical spondylosis without high-grade spinal canal stenosis. Upper chest: No acute findings. Other: None. IMPRESSION: 1. No acute intracranial abnormality. 2. No acute cervical spine fracture or traumatic malalignment. Electronically Signed   By: Orvan Falconer M.D.   On: 08/14/2023 09:48   DG Pelvis 1-2 Views  Result Date: 08/14/2023 CLINICAL DATA:  Patient fell on floor getting into bed. Right hip pain. EXAM: PELVIS - 1-2 VIEW; RIGHT FEMUR 2 VIEWS COMPARISON:  CT abdomen and pelvis 01/11/2024a; contemporaneous CT abdomen and pelvis 08/14/2023 FINDINGS: There is diffuse decreased bone mineralization. Mild bilateral superomedial femoroacetabular joint space narrowing. Moderate pubic symphysis joint space narrowing and peripheral osteophytosis. Mild bilateral sacroiliac joint space narrowing with right-greater-than-left subchondral sclerosis. Moderate right knee medial knee compartment joint space narrowing. Minimal chronic enthesopathic change at the quadriceps insertion on the patella. No joint effusion. There is subtle lucency at the superior aspect of the right greater trochanter corresponding to the small nondisplaced acute fracture better seen on today's CT abdomen and pelvis. Moderate to severe atherosclerotic calcifications. IMPRESSION: 1. Subtle lucency at the superior aspect of the right greater trochanter corresponding to the small nondisplaced acute fracture better seen on  today's CT abdomen and pelvis. 2. Mild bilateral femoroacetabular osteoarthritis. 3. Moderate medial right knee compartment osteoarthritis. Electronically Signed   By: Neita Garnet M.D.   On: 08/14/2023 09:39   DG FEMUR, MIN 2 VIEWS RIGHT  Result Date: 08/14/2023 CLINICAL DATA:  Patient fell on floor getting into bed. Right hip pain. EXAM: PELVIS - 1-2 VIEW; RIGHT FEMUR 2 VIEWS COMPARISON:  CT abdomen and pelvis 01/11/2024a; contemporaneous CT abdomen and pelvis 08/14/2023 FINDINGS: There is diffuse decreased bone mineralization. Mild bilateral superomedial femoroacetabular joint space narrowing. Moderate pubic symphysis joint space narrowing and peripheral osteophytosis. Mild bilateral sacroiliac joint space narrowing with right-greater-than-left subchondral sclerosis. Moderate right knee medial knee compartment joint space narrowing. Minimal chronic enthesopathic change at the quadriceps insertion on the patella. No joint effusion.  There is subtle lucency at the superior aspect of the right greater trochanter corresponding to the small nondisplaced acute fracture better seen on today's CT abdomen and pelvis. Moderate to severe atherosclerotic calcifications. IMPRESSION: 1. Subtle lucency at the superior aspect of the right greater trochanter corresponding to the small nondisplaced acute fracture better seen on today's CT abdomen and pelvis. 2. Mild bilateral femoroacetabular osteoarthritis. 3. Moderate medial right knee compartment osteoarthritis. Electronically Signed   By: Neita Garnet M.D.   On: 08/14/2023 09:39    EKG: Independently reviewed.  A-fib with RVR, no acute ST changes.  Assessment/Plan Principal Problem:   Hip fracture (HCC) Active Problems:   Paroxysmal A-fib (HCC)   Acute CHF (congestive heart failure) (HCC)   Displaced fracture of greater trochanter of right femur, initial encounter for closed fracture (HCC)  (please populate well all problems here in Problem List. (For example,  if patient is on BP meds at home and you resume or decide to hold them, it is a problem that needs to be her. Same for CAD, COPD, HLD and so on)  Acute right great trochanter nondisplaced fracture -Orthopedic surgery paged. -Conservative management -May need contraction, will discuss with orthopedic surgery -Pain control -Waiting for orthopedic surgery's contraction for PT evaluation.  Expect nonweight bearing.  Acute on chronic HFpEF decompensation -Likely secondary to uncontrolled A-fib -Start IV Lasix 20 mg twice daily -Monitor kidney function  A-fib with RVR -1 dose of digoxin 0.125 mg -As needed Lopressor -Continue Coumadin  IDDM with hyperglycemia -Hold off Lantus given her CKD status -SSI for now  DVT prophylaxis: Coumadin Code Status: DNR Family Communication: Daughter at bedside Disposition Plan: Patient sick with right hip fracture requiring inpatient orthopedic consultation and management, and acute CHF decompensation requiring IV diuresis, expect more than 2 midnight hospital stay Consults called: Orthopedic surgery Admission status: Telemetry admission   Emeline General MD [facility] Pager [phone]  08/14/2023, 11:38 AM

## 2023-08-14 NOTE — ED Provider Notes (Addendum)
[facility] Provider Note    Event Date/Time   First MD Initiated Contact with Patient 08/14/23 0703     (approximate)   History   Fall   HPI  [name] is a 82 y.o. female with history of atrial fibrillation who comes in with a fall.  Patient is on warfarin.  She states that she does not think she hit her head but she is not entirely sure.  She denies any chest pain, shortness of breath, abdominal pain.  She states that she is compliant with her warfarin.  Her biggest concern is right hip pain.   Physical Exam   Triage Vital Signs: ED Triage Vitals  Encounter Vitals Group     BP 08/14/23 0649 (!) 122/90     Systolic BP Percentile --      Diastolic BP Percentile --      Pulse Rate 08/14/23 0649 (!) 112     Resp 08/14/23 0649 18     Temp 08/14/23 0649 97.9 F (36.6 C)     Temp Source 08/14/23 0649 Oral     SpO2 08/14/23 0649 98 %     Weight 08/14/23 0646 156 lb (70.8 kg)     Height 08/14/23 0646 4\' 10"  (1.473 m)     Head Circumference --      Peak Flow --      Pain Score 08/14/23 0646 8     Pain Loc --      Pain Education --      Exclude from Growth Chart --     Most recent vital signs: Vitals:   08/14/23 0649  BP: (!) 122/90  Pulse: (!) 112  Resp: 18  Temp: 97.9 F (36.6 C)  SpO2: 98%     General: Awake, no distress.  CV:  Good peripheral perfusion.  Irregular, tachycardic Resp:  Normal effort.  Abd:  No distention.  Other:  No chest wall tenderness no abdominal tenderness she is got some right hip pain good distal pulse.  No obvious bruising noted. Able to lift both legs up off the bed  ED Results / Procedures / Treatments   Labs (all labs ordered are listed, but only abnormal results are displayed) Labs Reviewed  CBC WITH DIFFERENTIAL/PLATELET - Abnormal; Notable for the following components:      Result Value   RBC 3.14 (*)    Hemoglobin 9.6 (*)    HCT 30.8 (*)    Abs Immature Granulocytes 0.12 (*)    All other  components within normal limits  COMPREHENSIVE METABOLIC PANEL  MAGNESIUM  PROTIME-INR  TROPONIN I (HIGH SENSITIVITY)     EKG  My interpretation of EKG:  Atrial fibrillation with a rate of 121 without any ST elevation or T wave inversions, normal intervals  RADIOLOGY I have reviewed the xray personally and interpreted no obvious signs of any fracture CT pending    PROCEDURES:  Critical Care performed: No  Procedures   MEDICATIONS ORDERED IN ED: Medications  lidocaine (LIDODERM) 5 % 1 patch (1 patch Transdermal Patch Applied 08/14/23 0741)  acetaminophen (TYLENOL) tablet 1,000 mg (1,000 mg Oral Given 08/14/23 0743)  oxyCODONE (Oxy IR/ROXICODONE) immediate release tablet 2.5 mg (2.5 mg Oral Given 08/14/23 0743)     IMPRESSION / MDM / ASSESSMENT AND PLAN / ED COURSE  I reviewed the triage vital signs and the nursing notes.   Patient's presentation is most consistent with acute presentation with potential threat to life or bodily function.  Differential is fracture, dislocation, Electra abnormalities, intracranial hemorrhage, cervical fracture.  Given patient's age and she is on warfarin and I recommended getting CT imaging just to ensure no evidence of intracranial hemorrhage and cervical fracture.  The x-rays without any obvious fracture I will get a CT pelvis just to ensure that there is no retroperitoneal bleeds or pelvic fracture that is being missed given patient's extent of discomfort.  Will treat patient's pain with oxycodone, Tylenol, lidocaine patches.  Her heart rate was elevated we will give her some oral metoprolol.  I reviewed her hospital discharge summary on 9/29 where do not see that patient's metoprolol was continued that she was previously on therefore we will give her a dose of that.  INR is 1.7.  Hemoglobin is stable.  CMP shows creatinine that is around her baseline with a normal potassium.  Troponin is negative magnesium normal.  CT imaging shows  trochanter fracture on the right side which explain patient's pain.  I have messaged the orthopedic doctor, Audelia Acton who will come see patient and recommend Wbas tolerated w/ abduction precautions.   Patient's heart rates are in the 110s to 120s I reviewed her records and I discussed with patient's daughter who stated that she was taken off her metoprolol due to low heart rates a few months ago.  Discussed with her that the elevated heart rates could be secondary to pain but we can start a small dose of diltiazem to try to help with the heart rates.  Do not want to give any IV medications due to risk of bradycardia that she has had previously.  I will discuss with the hospital team for admission  The patient is on the cardiac monitor to evaluate for evidence of arrhythmia and/or significant heart rate changes.      FINAL CLINICAL IMPRESSION(S) / ED DIAGNOSES   Final diagnoses:  Closed nondisplaced fracture of greater trochanter of right femur, initial encounter (HCC)  Atrial fibrillation, unspecified type (HCC)     Rx / DC Orders   ED Discharge Orders     None        Note:  This document was prepared using Dragon voice recognition software and may include unintentional dictation errors.   Concha Se, MD 08/14/23 1033    Concha Se, MD 08/14/23 [phone]

## 2023-08-14 NOTE — ED Notes (Signed)
Patient transported to CT 

## 2023-08-14 NOTE — Progress Notes (Signed)
PHARMACY - ANTICOAGULATION CONSULT NOTE  Pharmacy Consult for warfarin Indication: atrial fibrillation  No Known Allergies  Patient Measurements: Height: 4\' 10"  (147.3 cm) Weight: 70.8 kg (156 lb) IBW/kg (Calculated) : 40.9  Vital Signs: Temp: 98 F (36.7 C) (10/22 1056) Temp Source: Oral (10/22 1056) BP: 120/69 (10/22 1130) Pulse Rate: 114 (10/22 1130)  Labs: Recent Labs    08/14/23 0808 08/14/23 1025  HGB 9.6*  --   HCT 30.8*  --   PLT 226  --   LABPROT 20.2*  --   INR 1.7*  --   CREATININE 2.43*  --   TROPONINIHS 9 9    Estimated Creatinine Clearance: 14.9 mL/min (A) (by C-G formula based on SCr of 2.43 mg/dL (H)).   Medical History: Past Medical History:  Diagnosis Date   Anemia    Atrial fibrillation (HCC)    Chronic kidney disease    Congestive heart failure (CHF) (HCC)    COPD (chronic obstructive pulmonary disease) (HCC)    Diabetes mellitus without complication (HCC)    GERD (gastroesophageal reflux disease)    Hyperlipidemia    Hypertension    Pneumonia    Pulmonary fibrosis (HCC)     Medications:  Warfarin 2 mg Tues, Thurs, Sat, Sun Warfarin 4 mg Mon, Wed  Last dose 10/21 - 4mg   Assessment: 82 year old female presented with fall and right hip pain. PMH atrial fibrillation on warfarin.  No clinically significant drug interactions noted  Date INR Warfarin Dose  10/21 -- 4mg  (PTA)  10/22 1.7 3mg      Goal of Therapy:  INR 2-3 Monitor platelets by anticoagulation protocol: Yes   Plan:  Will give warfarin 3 mg for today. PT-INR tomorrow AM  Elliot Gurney, PharmD, BCPS Clinical Pharmacist  08/14/2023 11:48 AM

## 2023-08-15 ENCOUNTER — Inpatient Hospital Stay (HOSPITAL_COMMUNITY)
Admit: 2023-08-15 | Discharge: 2023-08-15 | Disposition: A | Discharge status: Home / Self Care | Payer: Medicare HMO | Attending: Internal Medicine | Admitting: Internal Medicine

## 2023-08-15 DIAGNOSIS — I480 Paroxysmal atrial fibrillation: Secondary | ICD-10-CM | POA: Diagnosis not present

## 2023-08-15 DIAGNOSIS — I5033 Acute on chronic diastolic (congestive) heart failure: Secondary | ICD-10-CM

## 2023-08-15 DIAGNOSIS — I4891 Unspecified atrial fibrillation: Secondary | ICD-10-CM

## 2023-08-15 DIAGNOSIS — S72114A Nondisplaced fracture of greater trochanter of right femur, initial encounter for closed fracture: Secondary | ICD-10-CM | POA: Diagnosis not present

## 2023-08-15 LAB — BASIC METABOLIC PANEL
Anion gap: 12 (ref 5–15)
BUN: 39 mg/dL — ABNORMAL HIGH (ref 8–23)
CO2: 25 mmol/L (ref 22–32)
Calcium: 8.7 mg/dL — ABNORMAL LOW (ref 8.9–10.3)
Chloride: 99 mmol/L (ref 98–111)
Creatinine, Ser: 3.03 mg/dL — ABNORMAL HIGH (ref 0.44–1.00)
GFR, Estimated: 15 mL/min — ABNORMAL LOW (ref >60)
Glucose, Bld: 162 mg/dL — ABNORMAL HIGH (ref 70–99)
Potassium: 4.5 mmol/L (ref 3.5–5.1)
Sodium: 136 mmol/L (ref 135–145)

## 2023-08-15 LAB — ECHOCARDIOGRAM COMPLETE
AR max vel: 1.31 cm2
AV Area VTI: 1.17 cm2
AV Area mean vel: 1.18 cm2
AV Mean grad: 11.3 mm[Hg]
AV Peak grad: 20.1 mm[Hg]
Ao pk vel: 2.24 m/s
Area-P 1/2: 4.41 cm2
Height: 58 in
MV VTI: 3.33 cm2
S' Lateral: 2.9 cm
Weight: 2504.43 [oz_av]

## 2023-08-15 LAB — GLUCOSE, CAPILLARY
Glucose-Capillary: 131 mg/dL — ABNORMAL HIGH (ref 70–99)
Glucose-Capillary: 192 mg/dL — ABNORMAL HIGH (ref 70–99)
Glucose-Capillary: 171 mg/dL — ABNORMAL HIGH (ref 70–99)
Glucose-Capillary: 159 mg/dL — ABNORMAL HIGH (ref 70–99)

## 2023-08-15 LAB — PROTIME-INR
INR: 1.6 — ABNORMAL HIGH (ref 0.8–1.2)
Prothrombin Time: 19.6 s — ABNORMAL HIGH (ref 11.4–15.2)

## 2023-08-15 MED ORDER — WARFARIN SODIUM 5 MG PO TABS
5.0000 mg | ORAL_TABLET | Freq: Once | ORAL | Status: AC
Start: 2023-08-15 — End: 2023-08-15
  Administered 2023-08-15: 5 mg via ORAL
  Filled 2023-08-15: qty 1

## 2023-08-15 MED ORDER — TORSEMIDE 20 MG PO TABS
60.0000 mg | ORAL_TABLET | Freq: Every day | ORAL | Status: DC
  Administered 2023-08-16 – 2023-08-19 (×4): 60 mg via ORAL
  Filled 2023-08-15 (×4): qty 3

## 2023-08-15 MED ORDER — INSULIN GLARGINE-YFGN 100 UNIT/ML ~~LOC~~ SOLN
10.0000 [IU] | Freq: Every day | SUBCUTANEOUS | Status: DC
  Administered 2023-08-15 – 2023-08-17 (×3): 10 [IU] via SUBCUTANEOUS
  Filled 2023-08-15 (×3): qty 0.1

## 2023-08-15 NOTE — Progress Notes (Signed)
PT Cancellation Note  Patient Details Name: [name] MRN: [id number] DOB: 1941/02/06   Cancelled Treatment:    Reason Eval/Treat Not Completed: Other (comment) (Waiting for hip abduction brace. Discussed with nurse. PT will return when brace arrives)  [name], PT, MPT  Ina Homes 08/15/2023, 10:19 AM

## 2023-08-15 NOTE — Progress Notes (Signed)
*  PRELIMINARY RESULTS* Echocardiogram 2D Echocardiogram has been performed.  Carolyne Fiscal 08/15/2023, 2:52 PM

## 2023-08-15 NOTE — Progress Notes (Signed)
PHARMACY - ANTICOAGULATION CONSULT NOTE  Pharmacy Consult for warfarin Indication: atrial fibrillation  No Known Allergies  Patient Measurements: Height: 4\' 10"  (147.3 cm) Weight: 71 kg (156 lb 8.4 oz) IBW/kg (Calculated) : 40.9  Vital Signs: Temp: 98 F (36.7 C) (10/23 0722) BP: 120/72 (10/23 0722) Pulse Rate: 87 (10/22 2153)  Labs: Recent Labs    08/14/23 0808 08/14/23 1025 08/15/23 0500  HGB 9.6*  --   --   HCT 30.8*  --   --   PLT 226  --   --   LABPROT 20.2*  --  19.6*  INR 1.7*  --  1.6*  CREATININE 2.43*  --  3.03*  TROPONINIHS 9 9  --     Estimated Creatinine Clearance: 12 mL/min (A) (by C-G formula based on SCr of 3.03 mg/dL (H)).   Medical History: Past Medical History:  Diagnosis Date   Anemia    Atrial fibrillation (HCC)    Chronic kidney disease    Congestive heart failure (CHF) (HCC)    COPD (chronic obstructive pulmonary disease) (HCC)    Diabetes mellitus without complication (HCC)    GERD (gastroesophageal reflux disease)    Hyperlipidemia    Hypertension    Pneumonia    Pulmonary fibrosis (HCC)     Medications:  Warfarin 2 mg Tues, Thurs, Sat, Sun Warfarin 4 mg Mon, Wed  Last dose 10/21 - 4mg   Assessment: 82 year old female presented with fall and right hip pain. PMH atrial fibrillation on warfarin.  No clinically significant drug interactions noted  Date INR Warfarin Dose  10/21 -- 4mg  (PTA)  10/22 1.7 3mg   10/23 1.6 5 mg (ordered)     Goal of Therapy:  INR 2-3 Monitor platelets by anticoagulation protocol: Yes   Plan:  INR subtherapeutic and decreased from yesterday Will give warfarin 5 mg for today, a slightly higher dose than her home dose to hopefully bump her back into goal. PT-INR tomorrow AM  Paulita Fujita, PharmD Clinical Pharmacist  08/15/2023 7:27 AM

## 2023-08-15 NOTE — Plan of Care (Signed)

## 2023-08-15 NOTE — Evaluation (Signed)
Physical Therapy Evaluation Patient Details Name: [name] MRN: [id number] DOB: 1941-10-15 Today's Date: 08/15/2023  History of Present Illness  Patient is a 82 year old female with mechanical fall with tip of the right greater trochanter avulsion fracture with hip abduction brace recommended. History of PAF, bradycardia, COPD, CKD, DM, HTN, HLD.      Clinical Impression  PT evaluation completed. Patient is modified independent with mobility at home using a rolling walker for ambulation. She lives with several family members at home and has a ramped entrance and DME.  Hip abduction brace in place throughout session today. Patient reports no pain with mobility. She has tremors that are more pronounced with sitting upright and standing. +2 person assistance required for standing with weight acceptance on BLE with standing using rolling walker for support. Unable to progress ambulation due to poor standing tolerance and fatigue with activity. She is not at her baseline level of functional independence. Anticipate patient will need continued rehabilitation after this hospital stay < 3 hours/day.       If plan is discharge home, recommend the following: A lot of help with walking and/or transfers;A lot of help with bathing/dressing/bathroom;Assistance with cooking/housework;Help with stairs or ramp for entrance;Assist for transportation   Can travel by private vehicle   No    Equipment Recommendations None recommended by PT  Recommendations for Other Services       Functional Status Assessment Patient has had a recent decline in their functional status and demonstrates the ability to make significant improvements in function in a reasonable and predictable amount of time.     Precautions / Restrictions Precautions Precautions: Fall; Vision impaired Required Braces or Orthoses: Other Brace Other Brace: hip abduction brace Restrictions Weight Bearing Restrictions: Yes RLE Weight  Bearing: Weight bearing as tolerated Other Position/Activity Restrictions: WBAT with RW. no active right hip abduction      Mobility  Bed Mobility Overal bed mobility: Needs Assistance Bed Mobility: Supine to Sit, Sit to Supine     Supine to sit: Max assist Sit to supine: Mod assist, +2 for physical assistance   General bed mobility comments: assistance for LE and trunk support. verbal cues for technique    Transfers Overall transfer level: Needs assistance Equipment used: Rolling walker (2 wheels) Transfers: Sit to/from Stand Sit to Stand: Mod assist, +2 physical assistance           General transfer comment: cues for technique. intermittent tremor-like activity UE>LE that is more pronounced with mobility    Ambulation/Gait               General Gait Details: not attempted due to poor standing tolerance and fatigue with activity  Stairs            Wheelchair Mobility     Tilt Bed    Modified Rankin (Stroke Patients Only)       Balance Overall balance assessment: Needs assistance Sitting-balance support: No upper extremity supported, Feet supported Sitting balance-Leahy Scale: Fair     Standing balance support: Bilateral upper extremity supported, Reliant on assistive device for balance Standing balance-Leahy Scale: Poor Standing balance comment: external support required to maintain standing balance                             Pertinent Vitals/Pain Pain Assessment Pain Assessment: No/denies pain    Home Living Family/patient expects to be discharged to:: Private residence Living Arrangements: Children;Other relatives (several  family members in the home) Available Help at Discharge: Family;Available 24 hours/day Type of Home: Mobile home Home Access: Ramped entrance       Home Layout: One level Home Equipment: Rollator (4 wheels);Shower seat;BSC/3in1;Rolling Environmental consultant (2 wheels)      Prior Function Prior Level of Function :  Needs assist             Mobility Comments: Mod I with rolling walker. can walk in the home, down the ramp to get to the car ADLs Comments: needs set-up assist at a minimum due to vision impairments.     Extremity/Trunk Assessment   Upper Extremity Assessment Upper Extremity Assessment: Overall WFL for tasks assessed    Lower Extremity Assessment Lower Extremity Assessment: RLE deficits/detail RLE Deficits / Details: hip abduction brace in place and unlocked for hip flexion 0-90 degrees. patient is able to activate hip flexion, knee, and ankle movement       Communication   Communication Communication: No apparent difficulties  Cognition Arousal: Alert Behavior During Therapy: WFL for tasks assessed/performed Overall Cognitive Status: Within Functional Limits for tasks assessed                                 General Comments: patient is able to follow single step commands with increased time        General Comments      Exercises     Assessment/Plan    PT Assessment Patient needs continued PT services  PT Problem List Decreased strength;Decreased range of motion;Decreased activity tolerance;Decreased balance;Decreased mobility;Decreased knowledge of use of DME;Decreased safety awareness       PT Treatment Interventions DME instruction;Gait training;Functional mobility training;Therapeutic activities;Therapeutic exercise;Balance training;Neuromuscular re-education;Cognitive remediation;Patient/family education;Wheelchair mobility training    PT Goals (Current goals can be found in the Care Plan section)  Acute Rehab PT Goals Patient Stated Goal: son would like patient to be more independent with mobility before coming home PT Goal Formulation: With family Time For Goal Achievement: 08/29/23 Potential to Achieve Goals: Fair    Frequency Min 1X/week     Co-evaluation               AM-PAC PT "6 Clicks" Mobility  Outcome Measure Help  needed turning from your back to your side while in a flat bed without using bedrails?: A Lot Help needed moving from lying on your back to sitting on the side of a flat bed without using bedrails?: A Lot Help needed moving to and from a bed to a chair (including a wheelchair)?: A Lot Help needed standing up from a chair using your arms (e.g., wheelchair or bedside chair)?: Total Help needed to walk in hospital room?: Total Help needed climbing 3-5 steps with a railing? : Total 6 Click Score: 9    End of Session Equipment Utilized During Treatment:  (hip abduction brace on throughout) Activity Tolerance: Patient tolerated treatment well;Patient limited by fatigue Patient left: in bed;with call bell/phone within reach;with bed alarm set Nurse Communication: Mobility status PT Visit Diagnosis: Unsteadiness on feet (R26.81);Muscle weakness (generalized) (M62.81)    Time: 0981-1914 PT Time Calculation (min) (ACUTE ONLY): 29 min   Charges:   PT Evaluation $PT Eval Low Complexity: 1 Low PT Treatments $Therapeutic Activity: 8-22 mins PT General Charges $$ ACUTE PT VISIT: 1 Visit         Donna Bernard, PT, MPT   Ina Homes 08/15/2023, 3:57 PM

## 2023-08-15 NOTE — Progress Notes (Signed)
[facility]  - [place] at [facility]   PATIENT NAME: [name]    MR#:  [id number]  DATE OF BIRTH:  [birth year]  SUBJECTIVE:  no family at bedside. Left message for daughter June. Came in after she had mechanical fall at home. Trying to set on the bed but missed and started having right hip pain. Found to have avulsion fracture right greater trochanter. Tolerating PO diet.  VITALS:  Blood pressure 120/72, pulse 87, temperature 98 F (36.7 C), resp. rate 17, height 4\' 10"  (1.473 m), weight 71 kg, SpO2 90%.  PHYSICAL EXAMINATION:  limited exam GENERAL:  82 y.o.-year-old patient with no acute distress.  LUNGS: Normal breath sounds bilaterally, no wheezing CARDIOVASCULAR: S1, S2 normal ABDOMEN: Soft, nontender, nondistended. Bowel sounds present.  EXTREMITIES: No  edema b/l.    NEUROLOGIC: nonfocal  patient is alert and awake  LABORATORY PANEL:  CBC Recent Labs  Lab 08/14/23 0808  WBC 9.5  HGB 9.6*  HCT 30.8*  PLT 226    Chemistries  Recent Labs  Lab 08/14/23 0808 08/15/23 0500  NA 140 136  K 4.1 4.5  CL 104 99  CO2 24 25  GLUCOSE 175* 162*  BUN 34* 39*  CREATININE 2.43* 3.03*  CALCIUM 8.4* 8.7*  MG 1.8  --   AST 22  --   ALT 12  --   ALKPHOS 75  --   BILITOT 0.6  --     RADIOLOGY:  DG Chest Portable 1 View  Result Date: 08/14/2023 CLINICAL DATA:  Shortness of breath. EXAM: PORTABLE CHEST 1 VIEW COMPARISON:  Chest radiograph dated July 22, 2023. FINDINGS: The heart size and mediastinal contours are unchanged. Aortic calcification. Bilateral interstitial prominence with reticular opacities, consistent with patient's history of chronic interstitial lung disease. Possible trace left effusion. No pneumothorax. No acute osseous abnormality. IMPRESSION: Chronic interstitial lung disease with possible trace left pleural effusion. Electronically Signed   By: Hart Robinsons M.D.   On: 08/14/2023 13:24   CT ABDOMEN PELVIS WO CONTRAST  Addendum  Date: 08/14/2023   ADDENDUM REPORT: 08/14/2023 10:22 ADDENDUM: On further review, there is an acute, nondisplaced fracture of the right greater trochanter (coronal image 67 series 6). These results were discussed by telephone at the time of interpretation on 08/14/2023 at 10:21 am with provider [facility] , who verbally acknowledged these results. Electronically Signed   By: Orvan Falconer M.D.   On: 08/14/2023 10:22   Result Date: 08/14/2023 CLINICAL DATA:  Abdominal trauma, blunt.  Right hip pain. EXAM: CT ABDOMEN AND PELVIS WITHOUT CONTRAST TECHNIQUE: Multidetector CT imaging of the abdomen and pelvis was performed following the standard protocol without IV contrast. RADIATION DOSE REDUCTION: This exam was performed according to the departmental dose-optimization program which includes automated exposure control, adjustment of the mA and/or kV according to patient size and/or use of iterative reconstruction technique. COMPARISON:  CT abdomen/pelvis 11/02/2022. FINDINGS: Lower chest: Mild interlobular septal thickening with associated patchy ground-glass opacities and trace bilateral pleural effusions, possibly reflecting mild pulmonary edema. Coronary artery calcifications. Hepatobiliary: No focal liver abnormality is seen. Status post cholecystectomy. No biliary dilatation. Pancreas: Unremarkable. No pancreatic ductal dilatation or surrounding inflammatory changes. Spleen: Normal in size without focal abnormality. Adrenals/Urinary Tract: Adrenal glands are unremarkable. Prior right nephrectomy. Unchanged dilation and dense calcifications of the mid and distal right ureter. Left kidney is unremarkable. No hydronephrosis or calculi. Bladder is unremarkable for degree of distention. Stomach/Bowel: Small hiatal hernia. No dilated loops of small bowel.  Appendix is not visualized. Colon is unremarkable. No bowel wall thickening or surrounding inflammation. Vascular/Lymphatic: Aortic atherosclerosis. No enlarged  abdominal or pelvic lymph nodes. Reproductive: Status post hysterectomy. No adnexal masses. Other: Unchanged rectus diastasis and atrophy of the right abdominal musculature. Musculoskeletal: No acute or significant osseous findings. IMPRESSION: 1. No acute traumatic injury in the abdomen or pelvis. 2. Mild interlobular septal thickening with associated patchy ground-glass opacities and trace bilateral pleural effusions, possibly reflecting mild pulmonary edema. 3. Prior right nephrectomy. Unchanged dilation and dense calcifications of the mid and distal right ureter. Aortic Atherosclerosis (ICD10-I70.0). Electronically Signed: By: Orvan Falconer M.D. On: 08/14/2023 09:55   CT HEAD WO CONTRAST ( )  Result Date: 08/14/2023 CLINICAL DATA:  Neck trauma (Age >= 65y); Head trauma, minor (Age >= 65y). Fall. EXAM: CT HEAD WITHOUT CONTRAST CT CERVICAL SPINE WITHOUT CONTRAST TECHNIQUE: Multidetector CT imaging of the head and cervical spine was performed following the standard protocol without intravenous contrast. Multiplanar CT image reconstructions of the cervical spine were also generated. RADIATION DOSE REDUCTION: This exam was performed according to the departmental dose-optimization program which includes automated exposure control, adjustment of the mA and/or kV according to patient size and/or use of iterative reconstruction technique. COMPARISON:  CT head and cervical spine 11/30/2021. FINDINGS: CT HEAD FINDINGS Brain: No acute hemorrhage. Unchanged mild chronic small-vessel disease. Cortical gray-white differentiation is otherwise preserved. Prominence of the ventricles and sulci within expected range for age. No hydrocephalus or extra-axial collection. No mass effect or midline shift. Vascular: No hyperdense vessel or unexpected calcification. Skull: No calvarial fracture or suspicious bone lesion. Skull base is unremarkable. Sinuses/Orbits: No acute finding. Other: None. CT CERVICAL SPINE FINDINGS  Alignment: Unchanged trace anterolisthesis of C4 on C5. No traumatic malalignment. Skull base and vertebrae: No acute fracture. Normal craniocervical junction. No suspicious bone lesions. Soft tissues and spinal canal: No prevertebral fluid or swelling. No visible canal hematoma. Disc levels: Mild cervical spondylosis without high-grade spinal canal stenosis. Upper chest: No acute findings. Other: None. IMPRESSION: 1. No acute intracranial abnormality. 2. No acute cervical spine fracture or traumatic malalignment. Electronically Signed   By: Orvan Falconer M.D.   On: 08/14/2023 09:48   CT Cervical Spine Wo Contrast  Result Date: 08/14/2023 CLINICAL DATA:  Neck trauma (Age >= 65y); Head trauma, minor (Age >= 65y). Fall. EXAM: CT HEAD WITHOUT CONTRAST CT CERVICAL SPINE WITHOUT CONTRAST TECHNIQUE: Multidetector CT imaging of the head and cervical spine was performed following the standard protocol without intravenous contrast. Multiplanar CT image reconstructions of the cervical spine were also generated. RADIATION DOSE REDUCTION: This exam was performed according to the departmental dose-optimization program which includes automated exposure control, adjustment of the mA and/or kV according to patient size and/or use of iterative reconstruction technique. COMPARISON:  CT head and cervical spine 11/30/2021. FINDINGS: CT HEAD FINDINGS Brain: No acute hemorrhage. Unchanged mild chronic small-vessel disease. Cortical gray-white differentiation is otherwise preserved. Prominence of the ventricles and sulci within expected range for age. No hydrocephalus or extra-axial collection. No mass effect or midline shift. Vascular: No hyperdense vessel or unexpected calcification. Skull: No calvarial fracture or suspicious bone lesion. Skull base is unremarkable. Sinuses/Orbits: No acute finding. Other: None. CT CERVICAL SPINE FINDINGS Alignment: Unchanged trace anterolisthesis of C4 on C5. No traumatic malalignment. Skull  base and vertebrae: No acute fracture. Normal craniocervical junction. No suspicious bone lesions. Soft tissues and spinal canal: No prevertebral fluid or swelling. No visible canal hematoma. Disc levels: Mild cervical spondylosis without high-grade spinal  canal stenosis. Upper chest: No acute findings. Other: None. IMPRESSION: 1. No acute intracranial abnormality. 2. No acute cervical spine fracture or traumatic malalignment. Electronically Signed   By: Orvan Falconer M.D.   On: 08/14/2023 09:48   DG Pelvis 1-2 Views  Result Date: 08/14/2023 CLINICAL DATA:  Patient fell on floor getting into bed. Right hip pain. EXAM: PELVIS - 1-2 VIEW; RIGHT FEMUR 2 VIEWS COMPARISON:  CT abdomen and pelvis 01/11/2024a; contemporaneous CT abdomen and pelvis 08/14/2023 FINDINGS: There is diffuse decreased bone mineralization. Mild bilateral superomedial femoroacetabular joint space narrowing. Moderate pubic symphysis joint space narrowing and peripheral osteophytosis. Mild bilateral sacroiliac joint space narrowing with right-greater-than-left subchondral sclerosis. Moderate right knee medial knee compartment joint space narrowing. Minimal chronic enthesopathic change at the quadriceps insertion on the patella. No joint effusion. There is subtle lucency at the superior aspect of the right greater trochanter corresponding to the small nondisplaced acute fracture better seen on today's CT abdomen and pelvis. Moderate to severe atherosclerotic calcifications. IMPRESSION: 1. Subtle lucency at the superior aspect of the right greater trochanter corresponding to the small nondisplaced acute fracture better seen on today's CT abdomen and pelvis. 2. Mild bilateral femoroacetabular osteoarthritis. 3. Moderate medial right knee compartment osteoarthritis. Electronically Signed   By: Neita Garnet M.D.   On: 08/14/2023 09:39   DG FEMUR, MIN 2 VIEWS RIGHT  Result Date: 08/14/2023 CLINICAL DATA:  Patient fell on floor getting into  bed. Right hip pain. EXAM: PELVIS - 1-2 VIEW; RIGHT FEMUR 2 VIEWS COMPARISON:  CT abdomen and pelvis 01/11/2024a; contemporaneous CT abdomen and pelvis 08/14/2023 FINDINGS: There is diffuse decreased bone mineralization. Mild bilateral superomedial femoroacetabular joint space narrowing. Moderate pubic symphysis joint space narrowing and peripheral osteophytosis. Mild bilateral sacroiliac joint space narrowing with right-greater-than-left subchondral sclerosis. Moderate right knee medial knee compartment joint space narrowing. Minimal chronic enthesopathic change at the quadriceps insertion on the patella. No joint effusion. There is subtle lucency at the superior aspect of the right greater trochanter corresponding to the small nondisplaced acute fracture better seen on today's CT abdomen and pelvis. Moderate to severe atherosclerotic calcifications. IMPRESSION: 1. Subtle lucency at the superior aspect of the right greater trochanter corresponding to the small nondisplaced acute fracture better seen on today's CT abdomen and pelvis. 2. Mild bilateral femoroacetabular osteoarthritis. 3. Moderate medial right knee compartment osteoarthritis. Electronically Signed   By: Neita Garnet M.D.   On: 08/14/2023 09:39    Assessment and Plan AYME REINTS is a 82 y.o. female with medical history significant of PAF on warfarin, off rate control medications, due to persistent bradycardia, COPD, CKD stage IV, IDDM, HTN, HLD, brought in by family member after a mechanical fall.   Patient fell on her right side and hit her right hip about sitting down on her bed and she missed the edge of the bed.  Excruciating pain and unable to stand on herself.  Denied any LOC, no head injury.   CT abdomen pelvis showed right greater trochanter fracture  CT head and neck negative for acute findings.   Acute right great trochanter nondisplaced fracture -Orthopedic surgery dr Ellene Route input appreciated--Conservative mnx with pain  meds, abductor brace and PT/OT -Conservative management -Pain control   Acute on chronic HFpEF decompensation -Likely secondary to uncontrolled A-fib -Start IV Lasix 20 mg twice daily--change to po home dose torsemide  -Monitor kidney function --sats >90% on RA   A-fib with RVR -1 dose of digoxin 0.125 mg -As needed Lopressor -Continue Coumadin  DM with hyperglycemia with CKD-IV -sugars high--resume lantus at 10 units for now -SSI for now  TOC for d/c planning PT to see--use abductor brace per ortho instructions  Family communication :left message for dter Consults : CODE STATUS:  DVT Prophylaxis : Level of care: Telemetry Medical Status is: Inpatient Remains inpatient appropriate because: PT/OT to see and toc for d/c planning    TOTAL TIME TAKING CARE OF THIS PATIENT: 35 minutes.  >50% time spent on counselling and coordination of care  Note: This dictation was prepared with Dragon dictation along with smaller phrase technology. Any transcriptional errors that result from this process are unintentional.  Enedina Finner M.D    [facility]   CC: Primary care physician; Sallyanne Kuster, NP

## 2023-08-15 NOTE — Plan of Care (Signed)
  Problem: Coping: Goal: Ability to adjust to condition or change in health will improve Outcome: Progressing   Problem: Nutritional: Goal: Maintenance of adequate nutrition will improve Outcome: Progressing Goal: Progress toward achieving an optimal weight will improve Outcome: Progressing   Problem: Tissue Perfusion: Goal: Adequacy of tissue perfusion will improve Outcome: Progressing   Problem: Clinical Measurements: Goal: Ability to maintain clinical measurements within normal limits will improve Outcome: Progressing Goal: Will remain free from infection Outcome: Progressing Goal: Diagnostic test results will improve Outcome: Progressing Goal: Respiratory complications will improve Outcome: Progressing Goal: Cardiovascular complication will be avoided Outcome: Progressing   Problem: Pain Management: Goal: General experience of comfort will improve Outcome: Progressing

## 2023-08-15 NOTE — Progress Notes (Signed)
OT Cancellation Note  Patient Details Name: [name] MRN: [id number] DOB: 1941/02/18   Cancelled Treatment:    Reason Eval/Treat Not Completed: Other (comment) (Awaiting abductor brace to be delivered d/t pt to perform no active abduction and to wear brace.) Will evaluate pt once brace is received.  Constance Goltz 08/15/2023, 10:41 AM

## 2023-08-15 NOTE — Progress Notes (Signed)
Orthopedic Tech Progress Note Patient Details:  [name] [birth year] [id number]  Patient ID: [name], female   DOB: 07/25/1941, 82 y.o.   MRN: [id number] [employer] order R hip abduction brace Tonye Pearson 08/15/2023, 10:18 AM

## 2023-08-16 DIAGNOSIS — I4891 Unspecified atrial fibrillation: Secondary | ICD-10-CM | POA: Diagnosis not present

## 2023-08-16 DIAGNOSIS — S72114A Nondisplaced fracture of greater trochanter of right femur, initial encounter for closed fracture: Secondary | ICD-10-CM | POA: Diagnosis not present

## 2023-08-16 DIAGNOSIS — I480 Paroxysmal atrial fibrillation: Secondary | ICD-10-CM | POA: Diagnosis not present

## 2023-08-16 LAB — PROTIME-INR
INR: 2 — ABNORMAL HIGH (ref 0.8–1.2)
Prothrombin Time: 23.1 s — ABNORMAL HIGH (ref 11.4–15.2)

## 2023-08-16 LAB — GLUCOSE, CAPILLARY
Glucose-Capillary: 196 mg/dL — ABNORMAL HIGH (ref 70–99)
Glucose-Capillary: 200 mg/dL — ABNORMAL HIGH (ref 70–99)
Glucose-Capillary: 142 mg/dL — ABNORMAL HIGH (ref 70–99)
Glucose-Capillary: 142 mg/dL — ABNORMAL HIGH (ref 70–99)

## 2023-08-16 MED ORDER — WARFARIN SODIUM 2 MG PO TABS
2.0000 mg | ORAL_TABLET | Freq: Once | ORAL | Status: AC
  Administered 2023-08-16: 2 mg via ORAL
  Filled 2023-08-16: qty 1

## 2023-08-16 MED ORDER — OXYCODONE-ACETAMINOPHEN 5-325 MG PO TABS
1.0000 | ORAL_TABLET | Freq: Four times a day (QID) | ORAL | Status: DC | PRN
  Administered 2023-08-17 – 2023-08-22 (×9): 1 via ORAL
  Filled 2023-08-16 (×10): qty 1

## 2023-08-16 NOTE — Plan of Care (Signed)
  Problem: Nutritional: Goal: Maintenance of adequate nutrition will improve Outcome: Progressing   Problem: Skin Integrity: Goal: Risk for impaired skin integrity will decrease Outcome: Progressing   Problem: Tissue Perfusion: Goal: Adequacy of tissue perfusion will improve Outcome: Progressing   Problem: Clinical Measurements: Goal: Will remain free from infection Outcome: Progressing Goal: Respiratory complications will improve Outcome: Progressing Goal: Cardiovascular complication will be avoided Outcome: Progressing   Problem: Nutrition: Goal: Adequate nutrition will be maintained Outcome: Progressing   Problem: Coping: Goal: Level of anxiety will decrease Outcome: Progressing   Problem: Elimination: Goal: Will not experience complications related to urinary retention Outcome: Progressing   Problem: Pain Management: Goal: General experience of comfort will improve Outcome: Progressing

## 2023-08-16 NOTE — Progress Notes (Signed)
Physical Therapy Treatment Patient Details Name: [name] MRN: [id number] DOB: 29-Jul-1941 Today's Date: 08/16/2023   History of Present Illness Patient is a 82 year old female with mechanical fall with tip of the right greater trochanter avulsion fracture with hip abduction brace recommended. History of PAF, bradycardia, COPD, CKD, DM, HTN, HLD.    PT Comments  Pt did well with PT session and though she showed some expected hesitancy with R LE movement she tolerated all aspects of PT session relatively well.  Pt struggled with consistency doing LE exercise reps in sequence but did genuinely show good effort and willingness to try.  She struggled with mobility but again showed good effort and despite pain was eager to try as much as she could.  She needed a lot of assist to get to/from supine/sit, not ready for true ambulation but able to take a few small side steps along EOB (UE reliant, expected R WBing hesitancy).  Pt will benefit from further PT, continue with POC.     If plan is discharge home, recommend the following: A lot of help with walking and/or transfers;A lot of help with bathing/dressing/bathroom;Assistance with cooking/housework;Help with stairs or ramp for entrance;Assist for transportation   Can travel by private vehicle     No  Equipment Recommendations  None recommended by PT    Recommendations for Other Services       Precautions / Restrictions Precautions Precautions: Fall Precaution Comments: vision impaired Required Braces or Orthoses: Other Brace Other Brace: hip abduction brace Restrictions Weight Bearing Restrictions: Yes RLE Weight Bearing: Weight bearing as tolerated (with walker) Other Position/Activity Restrictions: no active right hip abduction     Mobility  Bed Mobility Overal bed mobility: Needs Assistance Bed Mobility: Rolling     Supine to sit: Mod assist, Max assist Sit to supine: Mod assist, Max assist   General bed mobility  comments: Pt made some effort to get LEs toward EOB with cuing, but ultimately needed heavy assist to get to and maintain sitting EOB posture    Transfers Overall transfer level: Needs assistance Equipment used: Rolling walker (2 wheels) Transfers: Sit to/from Stand Sit to Stand: Mod assist           General transfer comment: pt needing assist to initiate upward movement, able to give some effort with transition, clearly hesitant to use R LE fully    Ambulation/Gait               General Gait Details: not attempted due to poor standing tolerance and fatigue with activity - however she was able to do some unweighting side steps along EOB with direct mod A for unweighting, movement encouragement and management of the walker   Stairs             Wheelchair Mobility     Tilt Bed    Modified Rankin (Stroke Patients Only)       Balance Overall balance assessment: Needs assistance Sitting-balance support: Bilateral upper extremity supported Sitting balance-Leahy Scale: Fair       Standing balance-Leahy Scale: Poor Standing balance comment: reliant on the walker, statically pretty good balance, significant hesitancy/guarding with R WBing increases during L steppage                            Cognition Arousal: Alert Behavior During Therapy: WFL for tasks assessed/performed Overall Cognitive Status: Within Functional Limits for tasks assessed (poor overall insight)  General Comments: patient is able to follow single step commands with increased time        Exercises General Exercises - Lower Extremity Ankle Circles/Pumps: AAROM, AROM, 10 reps Quad Sets: AROM, AAROM, 10 reps (hesitant on the R, lacks active TKE) Short Arc Quad: AAROM, AROM, 10 reps Heel Slides: AAROM, 10 reps (with lightly reisted leg ext, limited range) Hip ABduction/ADduction: AAROM, 10 reps    General Comments General comments  (skin integrity, edema, etc.): Pt motivated to try and work with PT despite her weakness and pain, good effort      Pertinent Vitals/Pain Pain Assessment Pain Assessment: Faces Pain Score: 6  Pain Location: R hip Pain Intervention(s): Limited activity within patient's tolerance    Home Living Family/patient expects to be discharged to:: Private residence Living Arrangements: Children;Other relatives (several family members in the home) Available Help at Discharge: Family;Available 24 hours/day Type of Home: Mobile home Home Access: Ramped entrance       Home Layout: One level Home Equipment: Rollator (4 wheels);Shower seat;BSC/3in1;Rolling Environmental consultant (2 wheels)      Prior Function            PT Goals (current goals can now be found in the care plan section) Progress towards PT goals: Progressing toward goals    Frequency    Min 1X/week      PT Plan      Co-evaluation              AM-PAC PT "6 Clicks" Mobility   Outcome Measure  Help needed turning from your back to your side while in a flat bed without using bedrails?: A Lot Help needed moving from lying on your back to sitting on the side of a flat bed without using bedrails?: A Lot Help needed moving to and from a bed to a chair (including a wheelchair)?: A Lot Help needed standing up from a chair using your arms (e.g., wheelchair or bedside chair)?: Total Help needed to walk in hospital room?: Total Help needed climbing 3-5 steps with a railing? : Total 6 Click Score: 9    End of Session Equipment Utilized During Treatment: Gait belt Activity Tolerance: Patient tolerated treatment well;Patient limited by fatigue Patient left: in bed;with call bell/phone within reach;with bed alarm set Nurse Communication: Mobility status PT Visit Diagnosis: Unsteadiness on feet (R26.81);Muscle weakness (generalized) (M62.81)     Time: 4401-0272 PT Time Calculation (min) (ACUTE ONLY): 24 min  Charges:     $Therapeutic Exercise: 8-22 mins $Therapeutic Activity: 8-22 mins PT General Charges $$ ACUTE PT VISIT: 1 Visit                     Malachi Pro, DPT 08/16/2023, 2:32 PM

## 2023-08-16 NOTE — Progress Notes (Signed)
PHARMACY - ANTICOAGULATION CONSULT NOTE  Pharmacy Consult for warfarin Indication: atrial fibrillation  No Known Allergies  Patient Measurements: Height: 4\' 10"  (147.3 cm) Weight: 72.4 kg (159 lb 9.8 oz) IBW/kg (Calculated) : 40.9  Vital Signs: Temp: 98.5 F (36.9 C) (10/24 0054) BP: 112/71 (10/24 0054) Pulse Rate: 108 (10/24 0054)  Labs: Recent Labs    08/14/23 0808 08/14/23 1025 08/15/23 0500 08/16/23 0302  HGB 9.6*  --   --   --   HCT 30.8*  --   --   --   PLT 226  --   --   --   LABPROT 20.2*  --  19.6* 23.1*  INR 1.7*  --  1.6* 2.0*  CREATININE 2.43*  --  3.03*  --   TROPONINIHS 9 9  --   --     Estimated Creatinine Clearance: 12.1 mL/min (A) (by C-G formula based on SCr of 3.03 mg/dL (H)).   Medical History: Past Medical History:  Diagnosis Date   Anemia    Atrial fibrillation (HCC)    Chronic kidney disease    Congestive heart failure (CHF) (HCC)    COPD (chronic obstructive pulmonary disease) (HCC)    Diabetes mellitus without complication (HCC)    GERD (gastroesophageal reflux disease)    Hyperlipidemia    Hypertension    Pneumonia    Pulmonary fibrosis (HCC)     Medications:  Warfarin 2 mg Tues, Thurs, Sat, Sun Warfarin 4 mg Mon, Wed  Last dose 10/21 - 4mg   Assessment: 82 year old female presented with fall and right hip pain. PMH atrial fibrillation on warfarin.  No clinically significant drug interactions noted  Date INR Warfarin Dose  10/21 -- 4mg  (PTA)  10/22 1.7 3mg   10/23 1.6 5 mg  10/24 2.0 2 mg (ordered)     Goal of Therapy:  INR 2-3 Monitor platelets by anticoagulation protocol: Yes   Plan:  INR therapeutic Will give warfarin 2 mg for today (home dose) PT-INR tomorrow AM  Paulita Fujita, PharmD Clinical Pharmacist  08/16/2023 7:20 AM

## 2023-08-16 NOTE — Evaluation (Signed)
Occupational Therapy Evaluation Patient Details Name: [name] MRN: [id number] DOB: Mar 06, 1941 Today's Date: 08/16/2023   History of Present Illness Patient is a 82 year old female with mechanical fall with tip of the right greater trochanter avulsion fracture with hip abduction brace recommended. History of PAF, bradycardia, COPD, CKD, DM, HTN, HLD.   Clinical Impression   Pt was seen for OT evaluation this date. Prior to hospital admission, pt was living at home with her family who were available 24/7. Her daughter assisted with ADLs and pt had someone present during all mobility. Vision deficits were limiting which is why she typically had assist.   Pt presents to acute OT demonstrating impaired ADL performance and functional mobility 2/2 weakness, high pain, balance deficits and limited endurance (See OT problem list for additional functional deficits). Pt currently requires Max to Mod A for all rolling in bed for linen changes and pure wic placement. Pt in high levels of pain and declining EOB activity as nurse was in the process of getting IV set up for pain meds. Extensive time spent talking with son about history and best plan moving forward-he is agreeable to what's best for his mother. Pt did require MIN A for self feeding tasks d/t her vision deficits with items placed within reach and handed to pt for her to then self feed.  Pt would benefit from skilled OT services to address noted impairments and functional limitations (see below for any additional details) in order to maximize safety and independence while minimizing falls risk and caregiver burden. Recommend SNF on DC d/t high pain level and limitations with mobility.      If plan is discharge home, recommend the following: A lot of help with bathing/dressing/bathroom;A lot of help with walking and/or transfers;Two people to help with walking and/or transfers;Assist for transportation;Assistance with cooking/housework;Help with  stairs or ramp for entrance;Direct supervision/assist for medications management;Supervision due to cognitive status;Direct supervision/assist for financial management    Functional Status Assessment  Patient has had a recent decline in their functional status and demonstrates the ability to make significant improvements in function in a reasonable and predictable amount of time.  Equipment Recommendations  Hospital bed;Toilet rise with handles;Wheelchair (measurements OT);Wheelchair cushion (measurements OT);Other (comment) (if pt to return home; defer to next venue if agreeable to SNF)    Recommendations for Other Services       Precautions / Restrictions Precautions Precautions: Fall Precaution Comments: vision impaired Required Braces or Orthoses: Other Brace Other Brace: hip abduction brace Restrictions Weight Bearing Restrictions: Yes RLE Weight Bearing: Weight bearing as tolerated Other Position/Activity Restrictions: WBAT with RW. no active right hip abduction      Mobility Bed Mobility Overal bed mobility: Needs Assistance Bed Mobility: Rolling Rolling: Mod assist, Max assist         General bed mobility comments: Max A to roll to R side and Mod A to roll to L side; Max A x2 to scoot to [facility]    Transfers                          Balance       Sitting balance - Comments: not tested       Standing balance comment: not tested                           ADL either performed or assessed with clinical judgement   ADL  Overall ADL's : Needs assistance/impaired Eating/Feeding: Cueing for safety;Set up;Minimal assistance Eating/Feeding Details (indicate cue type and reason): d/t vision impairment pt needs Min to Set up with self feeding at baseline                                   General ADL Comments: Limited session d/t need for 2 assist and pt in 10/10 pain; will need Max A with LB ADLs d/t R hip pain at this time      Vision         Perception         Praxis         Pertinent Vitals/Pain Pain Assessment Pain Assessment: 0-10 Pain Score: 10-Worst pain ever Pain Location: R hip Pain Descriptors / Indicators: Sore Pain Intervention(s): Limited activity within patient's tolerance, Repositioned, Patient requesting pain meds-RN notified, Monitored during session     Extremity/Trunk Assessment Upper Extremity Assessment Upper Extremity Assessment: Overall WFL for tasks assessed   Lower Extremity Assessment Lower Extremity Assessment: Defer to PT evaluation       Communication Communication Communication: No apparent difficulties   Cognition Arousal: Alert Behavior During Therapy: WFL for tasks assessed/performed Overall Cognitive Status: Within Functional Limits for tasks assessed                                 General Comments: patient is able to follow single step commands with increased time     General Comments  pt having high pain this morning therefore declined OOB/EOB activity    Exercises     Shoulder Instructions      Home Living Family/patient expects to be discharged to:: Private residence Living Arrangements: Children;Other relatives (several family members in the home) Available Help at Discharge: Family;Available 24 hours/day Type of Home: Mobile home Home Access: Ramped entrance     Home Layout: One level     Bathroom Shower/Tub: Producer, television/film/video: Standard     Home Equipment: Rollator (4 wheels);Shower seat;BSC/3in1;Rolling Environmental consultant (2 wheels)          Prior Functioning/Environment Prior Level of Function : Needs assist             Mobility Comments: Mod I with rolling walker. can walk in the home, down the ramp to get to the car ADLs Comments: needs set-up assist at a minimum due to vision impairments, daughter would assist with dressing and bathing and had assist or at least SUP for toileting        OT  Problem List: Decreased strength;Pain;Decreased activity tolerance;Impaired balance (sitting and/or standing);Impaired vision/perception      OT Treatment/Interventions: Self-care/ADL training;Therapeutic exercise;Therapeutic activities;DME and/or AE instruction;Patient/family education;Balance training    OT Goals(Current goals can be found in the care plan section) Acute Rehab OT Goals Patient Stated Goal: improve strength and pain OT Goal Formulation: With patient/family Time For Goal Achievement: 08/30/23 Potential to Achieve Goals: Fair ADL Goals Pt Will Perform Grooming: sitting;with supervision Pt Will Perform Upper Body Bathing: with contact guard assist;sitting Pt Will Transfer to Toilet: with min assist;with +2 assist;ambulating;bedside commode;grab bars Additional ADL Goal #1: Pt will verbalize her R hip abduction precaution to therapist and adhere to it 100% of the time to prevent further injury.  OT Frequency: Min 1X/week    Co-evaluation  AM-PAC OT "6 Clicks" Daily Activity     Outcome Measure Help from another person eating meals?: A Little Help from another person taking care of personal grooming?: A Little Help from another person toileting, which includes using toliet, bedpan, or urinal?: A Lot Help from another person bathing (including washing, rinsing, drying)?: A Lot Help from another person to put on and taking off regular upper body clothing?: A Little Help from another person to put on and taking off regular lower body clothing?: A Lot 6 Click Score: 15   End of Session Nurse Communication: Mobility status  Activity Tolerance: Patient limited by pain Patient left: in bed;with call bell/phone within reach;with bed alarm set;with family/visitor present  OT Visit Diagnosis: Pain;History of falling (Z91.81);Muscle weakness (generalized) (M62.81);Other abnormalities of gait and mobility (R26.89) Pain - Right/Left: Right Pain - part of body: Hip                 Time: 6578-4696 OT Time Calculation (min): 38 min Charges:  OT General Charges $OT Visit: 1 Visit OT Evaluation $OT Eval Low Complexity: 1 Low OT Treatments $Self Care/Home Management : 23-37 mins Galia Rahm, OTR/L  08/16/23, 1:39 PM Bernice Mullin E Navaeh Kehres 08/16/2023, 1:34 PM

## 2023-08-16 NOTE — Progress Notes (Signed)
[facility]  - [place] [facility]   PATIENT NAME: [name]    MR#:  [id number]  DATE OF BIRTH:  07-Aug-1941  SUBJECTIVE:  no family at bedside. Spoke with son at bedside  Came in after she had mechanical fall at home. Found to have avulsion fracture right greater trochanter. Tolerating PO diet. Hip pain+ wearing brace Legally blind per son  VITALS:  Blood pressure 119/62, pulse (!) 108, temperature 98.5 F (36.9 C), resp. rate 16, height 4\' 10"  (1.473 m), weight 72.4 kg, SpO2 93%.  PHYSICAL EXAMINATION:  limited exam GENERAL:  82 y.o.-year-old patient with no acute distress.  LUNGS: Normal breath sounds bilaterally CARDIOVASCULAR: S1, S2 normal ABDOMEN: Soft, nontender, nondistended. EXTREMITIES: No  edema b/l.   Right hip/thigh abductor brace + NEUROLOGIC: nonfocal  patient is alert and awake  LABORATORY PANEL:  CBC Recent Labs  Lab 08/14/23 0808  WBC 9.5  HGB 9.6*  HCT 30.8*  PLT 226    Chemistries  Recent Labs  Lab 08/14/23 0808 08/15/23 0500  NA 140 136  K 4.1 4.5  CL 104 99  CO2 24 25  GLUCOSE 175* 162*  BUN 34* 39*  CREATININE 2.43* 3.03*  CALCIUM 8.4* 8.7*  MG 1.8  --   AST 22  --   ALT 12  --   ALKPHOS 75  --   BILITOT 0.6  --     RADIOLOGY:  ECHOCARDIOGRAM COMPLETE  Result Date: 08/15/2023    ECHOCARDIOGRAM REPORT   Patient Name:   [name] Date of Exam: 08/15/2023 Medical Rec #:  [id number]      Height:       58.0 in Accession #:    [id number]     Weight:       156.5 lb Date of Birth:  02-26-1941      BSA:          1.641 m Patient Age:    82 years       BP:           105/54 mmHg Patient Gender: F              HR:           115 bpm. Exam Location:  [facility] Procedure: 2D Echo, Cardiac Doppler and Color Doppler Indications:     CHF  History:         Patient has no prior history of Echocardiogram examinations.                  CHF, COPD, Arrythmias:Atrial Fibrillation,                  Signs/Symptoms:Shortness of Breath and  Chest Pain; Risk                  Factors:Hypertension, Diabetes, Dyslipidemia and Current                  Smoker.  Sonographer:     Mikki Harbor Referring Phys:  [id number] Emeline General Diagnosing Phys: Julien Nordmann MD IMPRESSIONS  1. Left ventricular ejection fraction, by estimation, is 45 to 50%. Left ventricular ejection fraction by PLAX is 51 %. The left ventricle has mildly decreased function. The left ventricle has no regional wall motion abnormalities. There is mild left ventricular hypertrophy. Left ventricular diastolic parameters are indeterminate.  2. Right ventricular systolic function is normal. The right ventricular size is normal. There is mildly elevated pulmonary artery systolic pressure.  The estimated right ventricular systolic pressure is 45.0 mmHg.  3. Left atrial size was mildly dilated.  4. The mitral valve is normal in structure. Mild mitral valve regurgitation. No evidence of mitral stenosis. Moderate mitral annular calcification.  5. Tricuspid valve regurgitation is mild to moderate.  6. The aortic valve is normal in structure. There is moderate calcification of the aortic valve. Aortic valve regurgitation is mild. Mild aortic valve stenosis. Aortic valve area, by VTI measures 1.17 cm. Aortic valve mean gradient measures 11.3 mmHg. Aortic valve Vmax measures 2.24 m/s. Visually, stenosis appears moderate.  7. The inferior vena cava is normal in size with greater than 50% respiratory variability, suggesting right atrial pressure of 3 mmHg. FINDINGS  Left Ventricle: Left ventricular ejection fraction, by estimation, is 45 to 50%. Left ventricular ejection fraction by PLAX is 51 %. The left ventricle has mildly decreased function. The left ventricle has no regional wall motion abnormalities. The left  ventricular internal cavity size was normal in size. There is mild left ventricular hypertrophy. Left ventricular diastolic parameters are indeterminate. Right Ventricle: The right  ventricular size is normal. No increase in right ventricular wall thickness. Right ventricular systolic function is normal. There is mildly elevated pulmonary artery systolic pressure. The tricuspid regurgitant velocity is 3.04  m/s, and with an assumed right atrial pressure of 8 mmHg, the estimated right ventricular systolic pressure is 45.0 mmHg. Left Atrium: Left atrial size was mildly dilated. Right Atrium: Right atrial size was normal in size. Pericardium: There is no evidence of pericardial effusion. Mitral Valve: The mitral valve is normal in structure. There is mild calcification of the mitral valve leaflet(s). Moderate mitral annular calcification. Mild mitral valve regurgitation. No evidence of mitral valve stenosis. MV peak gradient, 5.2 mmHg. The mean mitral valve gradient is 2.0 mmHg. Tricuspid Valve: The tricuspid valve is normal in structure. Tricuspid valve regurgitation is mild to moderate. No evidence of tricuspid stenosis. Aortic Valve: The aortic valve is normal in structure. There is moderate calcification of the aortic valve. Aortic valve regurgitation is mild. Mild aortic stenosis is present. Aortic valve mean gradient measures 11.3 mmHg. Aortic valve peak gradient measures 20.1 mmHg. Aortic valve area, by VTI measures 1.17 cm. Pulmonic Valve: The pulmonic valve was normal in structure. Pulmonic valve regurgitation is not visualized. No evidence of pulmonic stenosis. Aorta: The aortic root is normal in size and structure. Venous: The inferior vena cava is normal in size with greater than 50% respiratory variability, suggesting right atrial pressure of 3 mmHg. IAS/Shunts: No atrial level shunt detected by color flow Doppler.  LEFT VENTRICLE PLAX 2D LV EF:         Left ventricular ejection fraction by PLAX is 51 %. LVIDd:         3.90 cm LVIDs:         2.90 cm LV PW:         1.50 cm LV IVS:        1.40 cm LVOT diam:     2.00 cm LV SV:         56 LV SV Index:   34 LVOT Area:     3.14 cm  RIGHT  VENTRICLE RV Basal diam:  3.00 cm RV Mid diam:    2.50 cm RV S prime:     10.40 cm/s LEFT ATRIUM             Index        RIGHT ATRIUM  Index LA diam:        5.20 cm 3.17 cm/m   RA Area:     18.90 cm LA Vol (A2C):   73.9 ml 45.03 ml/m  RA Volume:   52.40 ml  31.93 ml/m LA Vol (A4C):   67.3 ml 41.01 ml/m LA Biplane Vol: 71.1 ml 43.33 ml/m  AORTIC VALVE                     PULMONIC VALVE AV Area (Vmax):    1.31 cm      PV Vmax:       1.01 m/s AV Area (Vmean):   1.18 cm      PV Peak grad:  4.1 mmHg AV Area (VTI):     1.17 cm AV Vmax:           224.33 cm/s AV Vmean:          156.333 cm/s AV VTI:            0.479 m AV Peak Grad:      20.1 mmHg AV Mean Grad:      11.3 mmHg LVOT Vmax:         93.80 cm/s LVOT Vmean:        58.700 cm/s LVOT VTI:          0.179 m LVOT/AV VTI ratio: 0.37  AORTA Ao Root diam: 2.50 cm Ao Asc diam:  2.80 cm MITRAL VALVE               TRICUSPID VALVE MV Area (PHT): 4.41 cm    TR Peak grad:   37.0 mmHg MV Area VTI:   3.33 cm    TR Vmax:        304.00 cm/s MV Peak grad:  5.2 mmHg MV Mean grad:  2.0 mmHg    SHUNTS MV Vmax:       1.14 m/s    Systemic VTI:  0.18 m MV Vmean:      60.5 cm/s   Systemic Diam: 2.00 cm MV Decel Time: 172 msec MV E velocity: 90.70 cm/s Julien Nordmann MD Electronically signed by Julien Nordmann MD Signature Date/Time: 08/15/2023/6:10:05 PM    Final     Assessment and Plan AAYRA TRACH is a 82 y.o. female with medical history significant of PAF on warfarin, off rate control medications, due to persistent bradycardia, COPD, CKD stage IV, IDDM, HTN, HLD, brought in by family member after a mechanical fall.   Patient fell on her right side and hit her right hip about sitting down on her bed and she missed the edge of the bed.  Excruciating pain and unable to stand on herself.  Denied any LOC, no head injury.   CT abdomen pelvis showed right greater trochanter fracture  CT head and neck negative for acute findings.   Acute right great trochanter  nondisplaced fracture -Orthopedic surgery dr Ellene Route input appreciated--Conservative mnx with pain meds, abductor brace and PT/OT -Conservative management -Pain control   Acute on chronic HFpEF decompensation -Likely secondary to uncontrolled A-fib -Start IV Lasix 20 mg twice daily--change to po home dose torsemide  -Monitor kidney function --sats >90% on RA   A-fib with RVR -1 dose of digoxin 0.125 mg -As needed Lopressor -Continue Coumadin   DM with hyperglycemia with CKD-IV -sugars high--resume lantus at 10 units for now -SSI for now  TOC for d/c planning PT to see--use abductor brace per ortho instructions  Family communication :son at bedside  Consults : CODE STATUS:  DVT Prophylaxis : Level of care: Telemetry Medical Status is: Inpatient Remains inpatient appropriate because: PT/OT to see and toc for d/c planning    TOTAL TIME TAKING CARE OF THIS PATIENT: 35 minutes.  >50% time spent on counselling and coordination of care  Note: This dictation was prepared with Dragon dictation along with smaller phrase technology. Any transcriptional errors that result from this process are unintentional.  Enedina Finner M.D    [facility]   CC: Primary care physician; Sallyanne Kuster, NP

## 2023-08-17 DIAGNOSIS — S72001A Fracture of unspecified part of neck of right femur, initial encounter for closed fracture: Secondary | ICD-10-CM | POA: Diagnosis not present

## 2023-08-17 LAB — GLUCOSE, CAPILLARY
Glucose-Capillary: 231 mg/dL — ABNORMAL HIGH (ref 70–99)
Glucose-Capillary: 141 mg/dL — ABNORMAL HIGH (ref 70–99)
Glucose-Capillary: 127 mg/dL — ABNORMAL HIGH (ref 70–99)
Glucose-Capillary: 103 mg/dL — ABNORMAL HIGH (ref 70–99)

## 2023-08-17 LAB — PROTIME-INR
INR: 2.3 — ABNORMAL HIGH (ref 0.8–1.2)
Prothrombin Time: 25.3 s — ABNORMAL HIGH (ref 11.4–15.2)

## 2023-08-17 MED ORDER — INSULIN GLARGINE-YFGN 100 UNIT/ML ~~LOC~~ SOLN
15.0000 [IU] | Freq: Every day | SUBCUTANEOUS | Status: DC
  Administered 2023-08-18 – 2023-08-19 (×2): 15 [IU] via SUBCUTANEOUS
  Filled 2023-08-17 (×3): qty 0.15

## 2023-08-17 MED ORDER — WARFARIN SODIUM 2 MG PO TABS
2.0000 mg | ORAL_TABLET | Freq: Once | ORAL | Status: AC
  Administered 2023-08-17: 2 mg via ORAL
  Filled 2023-08-17: qty 1

## 2023-08-17 NOTE — Progress Notes (Signed)
Occupational Therapy Treatment Patient Details Name: [name] MRN: [id number] DOB: 10-04-1941 Today's Date: 08/17/2023   History of present illness Patient is a 82 year old female with mechanical fall with tip of the right greater trochanter avulsion fracture with hip abduction brace recommended. History of PAF, bradycardia, COPD, CKD, DM, HTN, HLD.   OT comments  Pt is supine in bed on arrival. Easily arousable and agreeable to PT/OT co treat session for pt/therapist safety d/t high pain levels and weakness. She initially denies pain, but is noted to grimace and "jump" throughout session from R hip and hernia region pain. Pt performed supine<>sit at EOB with Max A x2, extra time and cueing for hand placement on rails. Max A x2 for STS from EOB with noted shaking/knee buckling at times requiring constant Min A x2 to maintain standing balance. Pt began having BM and returned to seated EOB. Agreeable to SPT from bed<>BSC-Required Max A x2 for safety. Returned to bed for hygiene and required Max to Mod A needed for rolling in bed. Total assist for posterior hygiene following BM.  Pt returned to bed with all needs in place and will cont to require skilled acute OT services to maximize her safety and IND to return to PLOF. SNF remains appropriate with family agreeable.       If plan is discharge home, recommend the following:  A lot of help with bathing/dressing/bathroom;A lot of help with walking and/or transfers;Two people to help with walking and/or transfers;Assist for transportation;Assistance with cooking/housework;Help with stairs or ramp for entrance;Direct supervision/assist for medications management;Supervision due to cognitive status;Direct supervision/assist for financial management   Equipment Recommendations  Other (comment) (defer)    Recommendations for Other Services      Precautions / Restrictions Precautions Precautions: Fall Required Braces or Orthoses: Other  Brace Other Brace: hip abduction brace Restrictions Weight Bearing Restrictions: Yes RLE Weight Bearing: Weight bearing as tolerated (with walker) Other Position/Activity Restrictions: no active right hip abduction       Mobility Bed Mobility Overal bed mobility: Needs Assistance Bed Mobility: Rolling Rolling: Max assist, Mod assist   Supine to sit: Max assist, +2 for physical assistance Sit to supine: Max assist, +2 for physical assistance   General bed mobility comments: pt makes effort to get BLEs to EOB, but needs Max A for trunk and BLE management; rolling for hygiene needed cueing for hand placement on bed rails and Max/Mod A    Transfers                         Balance Overall balance assessment: Needs assistance Sitting-balance support: Bilateral upper extremity supported Sitting balance-Leahy Scale: Fair Sitting balance - Comments: able to sit at EOB and on BSC with CGA to Min A with L lateral lean   Standing balance support: Bilateral upper extremity supported, Reliant on assistive device for balance Standing balance-Leahy Scale: Poor Standing balance comment: reliant on RW, shakey and legs buckle at times, Max A x2 for all transfers and STS                           ADL either performed or assessed with clinical judgement   ADL Overall ADL's : Needs assistance/impaired                         Toilet Transfer: +2 for physical assistance;Maximal assistance;Stand-pivot   ToiletingTeacher, music  and Hygiene: Total assistance;Bed level         General ADL Comments: very shakey in standing with buckling/tremoring with high fall risk; Max A x2 for all bed mobility, transfers and to ensure maintainance of standing balance    Extremity/Trunk Assessment Upper Extremity Assessment Upper Extremity Assessment: Generalized weakness   Lower Extremity Assessment Lower Extremity Assessment: Generalized weakness        Vision        Perception     Praxis      Cognition Arousal: Alert Behavior During Therapy: WFL for tasks assessed/performed Overall Cognitive Status: Within Functional Limits for tasks assessed                                          Exercises      Shoulder Instructions       General Comments motivated to participate but remains limited by pain and weakness    Pertinent Vitals/ Pain       Pain Assessment Pain Assessment: Faces Faces Pain Scale: Hurts even more Pain Location: R hip and likely hernia region Pain Descriptors / Indicators: Sore Pain Intervention(s): Monitored during session, Limited activity within patient's tolerance, Premedicated before session  Home Living                                          Prior Functioning/Environment              Frequency  Min 1X/week        Progress Toward Goals  OT Goals(current goals can now be found in the care plan section)  Progress towards OT goals: Progressing toward goals  Acute Rehab OT Goals Patient Stated Goal: improve pain and strength OT Goal Formulation: With patient/family Time For Goal Achievement: 08/30/23 Potential to Achieve Goals: Fair  Plan      Co-evaluation                 AM-PAC OT "6 Clicks" Daily Activity     Outcome Measure   Help from another person eating meals?: A Little Help from another person taking care of personal grooming?: A Little Help from another person toileting, which includes using toliet, bedpan, or urinal?: Total Help from another person bathing (including washing, rinsing, drying)?: A Lot Help from another person to put on and taking off regular upper body clothing?: A Little Help from another person to put on and taking off regular lower body clothing?: Total 6 Click Score: 13    End of Session Equipment Utilized During Treatment: Rolling walker (2 wheels)  OT Visit Diagnosis: Pain;History of falling (Z91.81);Muscle  weakness (generalized) (M62.81);Other abnormalities of gait and mobility (R26.89) Pain - Right/Left: Right Pain - part of body: Hip   Activity Tolerance Patient limited by pain   Patient Left in bed;with call bell/phone within reach;with bed alarm set;with family/visitor present   Nurse Communication Mobility status        Time: 1128-1203 OT Time Calculation (min): 35 min  Charges: OT General Charges $OT Visit: 1 Visit OT Treatments $Self Care/Home Management : 8-22 mins  Mansi Tokar, OTR/L  08/17/23, 12:25 PM  Ovella Manygoats E Jalana Moore 08/17/2023, 12:22 PM

## 2023-08-17 NOTE — Progress Notes (Signed)
PHARMACY - ANTICOAGULATION CONSULT NOTE  Pharmacy Consult for warfarin Indication: atrial fibrillation  No Known Allergies  Patient Measurements: Height: 4\' 10"  (147.3 cm) Weight: 75 kg (165 lb 5.5 oz) IBW/kg (Calculated) : 40.9  Vital Signs: Temp: 98.2 F (36.8 C) (10/25 0521) BP: 135/60 (10/25 0521) Pulse Rate: 109 (10/25 0521)  Labs: Recent Labs    08/14/23 0808 08/14/23 1025 08/15/23 0500 08/16/23 0302 08/17/23 0431  HGB 9.6*  --   --   --   --   HCT 30.8*  --   --   --   --   PLT 226  --   --   --   --   LABPROT 20.2*  --  19.6* 23.1* 25.3*  INR 1.7*  --  1.6* 2.0* 2.3*  CREATININE 2.43*  --  3.03*  --   --   TROPONINIHS 9 9  --   --   --     Estimated Creatinine Clearance: 12.3 mL/min (A) (by C-G formula based on SCr of 3.03 mg/dL (H)).   Medical History: Past Medical History:  Diagnosis Date   Anemia    Atrial fibrillation (HCC)    Chronic kidney disease    Congestive heart failure (CHF) (HCC)    COPD (chronic obstructive pulmonary disease) (HCC)    Diabetes mellitus without complication (HCC)    GERD (gastroesophageal reflux disease)    Hyperlipidemia    Hypertension    Pneumonia    Pulmonary fibrosis (HCC)     Medications:  Warfarin 2 mg Tues, Thurs, Sat, Sun Warfarin 4 mg Mon, Wed, Fri  Last dose 10/21 - 4mg   Assessment: 82 year old female presented with fall and right hip pain. PMH atrial fibrillation on warfarin.  No clinically significant drug interactions noted  Date INR Warfarin Dose  10/21 -- 4mg  (PTA)  10/22 1.7 3mg   10/23 1.6 5 mg  10/24 2.0 2 mg   10/25 2.3 2 mg (ordered)     Goal of Therapy:  INR 2-3 Monitor platelets by anticoagulation protocol: Yes   Plan:  INR therapeutic Will give warfarin 2 mg x 1 today (1/2 home dose) given the fast increase in INR PT-INR tomorrow AM  Paulita Fujita, PharmD Clinical Pharmacist  08/17/2023 7:15 AM

## 2023-08-17 NOTE — Plan of Care (Signed)

## 2023-08-17 NOTE — Progress Notes (Signed)
Progress Note    [name]  AOZ:[id number] DOB: Jul 03, 1941  DOA: 08/14/2023 PCP: Sallyanne Kuster, NP      Brief Narrative:    Medical records reviewed and are as summarized below:  [name] is a 82 y.o. female with medical history significant of PAF on warfarin, off rate control medications due to persistent bradycardia, COPD, pulmonary fibrosis, dementia, CKD stage IV, IDDM, HTN, HLD, legal blindness, brought in by family member after a mechanical fall.  Reportedly, patient fell on her right side and hit her right hip as she was trying to sit down on her bed when she missed the edge of the bed.       Assessment/Plan:   Principal Problem:   Hip fracture (HCC) Active Problems:   Atrial fibrillation (HCC)   Paroxysmal A-fib (HCC)   Acute CHF (congestive heart failure) (HCC)   Closed nondisplaced fracture of greater trochanter of right femur (HCC)    Body mass index is 34.56 kg/m.  (Obesity)   Acute right greater trochanter nondisplaced fracture: Patient was evaluated by orthopedic surgeon, Dr. Audelia Acton.  He recommended conservative management.  Analgesics as needed for pain.  PT and OT evaluation.   Acute on chronic HFpEF: Previously treated with IV Lasix.  Continue torsemide. 2D echo on 08/15/2023 showed EF estimated at 45 to 50% (midrange EF), mild LVH, indeterminate LV diastolic parameters, mildly elevated pulmonary arterial systolic pressure, mild MR, mild to moderate TR, mild aortic stenosis.   Atrial fibrillation with RVR: INR is therapeutic continue warfarin.  He is not on long-term rate control drugs because of history of bradycardia.   Type II DM with hyperglycemia: Continue insulin glargine.  NovoLog as needed-glycemia.   CKD stage IV: Creatinine is stable.   COPD, pulmonary fibrosis: Compensated.  She is tolerating room air.  Bronchodilators as needed.   Comorbidities include legal blindness, dementia: Continue supportive  care   Diet Order             Diet heart healthy/carb modified Room service appropriate? Yes; Fluid consistency: Thin  Diet effective now                            Consultants: Orthopedic surgeon  Procedures: None    Medications:    atorvastatin  10 mg Oral QPM   citalopram  20 mg Oral Daily   donepezil  5 mg Oral q morning   ferrous sulfate  325 mg Oral Q breakfast   influenza vaccine adjuvanted  0.5 mL Intramuscular Tomorrow-1000   insulin aspart  0-15 Units Subcutaneous TID WC   [START ON 08/18/2023] insulin glargine-yfgn  15 Units Subcutaneous Daily   lidocaine  1 patch Transdermal Q24H   linaclotide  145 mcg Oral QHS   linagliptin  5 mg Oral Daily   montelukast  10 mg Oral QHS   pantoprazole  40 mg Oral BID   rOPINIRole  0.5 mg Oral QPM   torsemide  60 mg Oral Daily   warfarin  2 mg Oral ONCE-1600   Warfarin - Pharmacist Dosing Inpatient   Does not apply q1600   Continuous Infusions:   Anti-infectives (From admission, onward)    None              Family Communication/Anticipated D/C date and plan/Code Status   DVT prophylaxis:  warfarin (COUMADIN) tablet 2 mg     Code Status: Limited: Do not attempt resuscitation (DNR) -DNR-LIMITED -  Do Not Intubate/DNI   Family Communication: None Disposition Plan: Plan to discharge to SNF   Status is: Inpatient Remains inpatient appropriate because: Awaiting placement to SNF       Subjective:   Interval events noted.  She has no complaints.  Objective:    Vitals:   08/17/23 0820 08/17/23 0940 08/17/23 0945 08/17/23 1021  BP: (!) 155/83 123/65 130/65 118/87  Pulse: (!) 105 (!) 129 (!) 131 98  Resp:  19 19   Temp: 99 F (37.2 C) 98.8 F (37.1 C) 98.8 F (37.1 C) 99.2 F (37.3 C)  TempSrc:      SpO2: 97% 96% 97% 99%  Weight:      Height:       No data found.   Intake/Output Summary (Last 24 hours) at 08/17/2023 1539 Last data filed at 08/17/2023 1240 Gross per 24  hour  Intake 500 ml  Output 850 ml  Net -350 ml   Filed Weights   08/15/23 0445 08/16/23 0500 08/17/23 0500  Weight: 71 kg 72.4 kg 75 kg    Exam:  GEN: NAD SKIN: Warm and dry EYES: No pallor or icterus ENT: MMM CV: Irregular rate and rhythm, tachycardic PULM: CTA B ABD: soft, ND, NT, +BS CNS: AAO x 2 (person and place), non focal EXT: No edema or tenderness.  Brace on the abdomen and right thigh     Data Reviewed:   I have personally reviewed following labs and imaging studies:  Labs: Labs show the following:   Basic Metabolic Panel: Recent Labs  Lab 08/14/23 0808 08/15/23 0500  NA 140 136  K 4.1 4.5  CL 104 99  CO2 24 25  GLUCOSE 175* 162*  BUN 34* 39*  CREATININE 2.43* 3.03*  CALCIUM 8.4* 8.7*  MG 1.8  --    GFR Estimated Creatinine Clearance: 12.3 mL/min (A) (by C-G formula based on SCr of 3.03 mg/dL (H)). Liver Function Tests: Recent Labs  Lab 08/14/23 0808  AST 22  ALT 12  ALKPHOS 75  BILITOT 0.6  PROT 6.8  ALBUMIN 3.0*   No results for input(s): "LIPASE", "AMYLASE" in the last 168 hours. No results for input(s): "AMMONIA" in the last 168 hours. Coagulation profile Recent Labs  Lab 08/14/23 0808 08/15/23 0500 08/16/23 0302 08/17/23 0431  INR 1.7* 1.6* 2.0* 2.3*    CBC: Recent Labs  Lab 08/14/23 0808  WBC 9.5  NEUTROABS 6.7  HGB 9.6*  HCT 30.8*  MCV 98.1  PLT 226   Cardiac Enzymes: No results for input(s): "CKTOTAL", "CKMB", "CKMBINDEX", "TROPONINI" in the last 168 hours. BNP (last 3 results) No results for input(s): "PROBNP" in the last 8760 hours. CBG: Recent Labs  Lab 08/16/23 1132 08/16/23 1646 08/16/23 2217 08/17/23 0821 08/17/23 1219  GLUCAP 200* 142* 142* 231* 141*   D-Dimer: No results for input(s): "DDIMER" in the last 72 hours. Hgb A1c: No results for input(s): "HGBA1C" in the last 72 hours. Lipid Profile: No results for input(s): "CHOL", "HDL", "LDLCALC", "TRIG", "CHOLHDL", "LDLDIRECT" in the last 72  hours. Thyroid function studies: No results for input(s): "TSH", "T4TOTAL", "T3FREE", "THYROIDAB" in the last 72 hours.  Invalid input(s): "FREET3" Anemia work up: No results for input(s): "VITAMINB12", "FOLATE", "FERRITIN", "TIBC", "IRON", "RETICCTPCT" in the last 72 hours. Sepsis Labs: Recent Labs  Lab 08/14/23 0808  WBC 9.5    Microbiology No results found for this or any previous visit (from the past 240 hour(s)).  Procedures and diagnostic studies:  No results found.  LOS: 3 days   Cambridge Deleo  [facility]   Pager on [email or web address]. If 7PM-7AM, please contact night-coverage at [email or web address]     08/17/2023, 3:39 PM

## 2023-08-17 NOTE — Progress Notes (Signed)
Physical Therapy Treatment Patient Details Name: [name] MRN: [id number] DOB: [birth year] Today's Date: 08/17/2023   History of Present Illness Patient is a 82 year old female with mechanical fall with tip of the right greater trochanter avulsion fracture with hip abduction brace recommended. History of PAF, bradycardia, COPD, CKD, DM, HTN, HLD.    PT Comments  Patient continues to require significant assistance with mobility. Activity tolerance limited by pain and fatigue. Attempted ambulation, however patient is unable to advance either leg despite facilitation. Anticipate patient will require rehabilitation after this hospital stay <3 hours/day and daughter is in agreement. PT will continue to follow.    If plan is discharge home, recommend the following: A lot of help with walking and/or transfers;A lot of help with bathing/dressing/bathroom;Assistance with cooking/housework;Help with stairs or ramp for entrance;Assist for transportation   Can travel by private vehicle     No  Equipment Recommendations  None recommended by PT    Recommendations for Other Services       Precautions / Restrictions Precautions Precautions: Fall Required Braces or Orthoses: Other Brace Other Brace: hip abduction brace Restrictions Weight Bearing Restrictions: Yes RLE Weight Bearing: Weight bearing as tolerated Other Position/Activity Restrictions: no active right hip abduction     Mobility  Bed Mobility Overal bed mobility: Needs Assistance       Supine to sit: Max assist, +2 for physical assistance Sit to supine: Max assist, +2 for physical assistance   General bed mobility comments: verbal cues for technique. increased time and effort required    Transfers Overall transfer level: Needs assistance Equipment used: Rolling walker (2 wheels), 2 person hand held assist Transfers: Sit to/from Stand, Bed to chair/wheelchair/BSC Sit to Stand: Max assist, +2 physical assistance    Step pivot transfers: Max assist, +2 physical assistance       General transfer comment: +2 person assistance required for sit to stand from bed with rolling walker. with pivot to bed side commode, patient required +2 person assistance without rolling walker    Ambulation/Gait             Pre-gait activities: attempted ambualtion, however patient is unable to advance either leg despite cues for technique, weight shifting assistance     Stairs             Wheelchair Mobility     Tilt Bed    Modified Rankin (Stroke Patients Only)       Balance Overall balance assessment: Needs assistance Sitting-balance support: Bilateral upper extremity supported Sitting balance-Leahy Scale: Fair Sitting balance - Comments: close stand by assistance to CGA provided for safety   Standing balance support: Bilateral upper extremity supported, Reliant on assistive device for balance Standing balance-Leahy Scale: Poor Standing balance comment: heavy relinace on rolling walker                            Cognition Arousal: Alert Behavior During Therapy: WFL for tasks assessed/performed Overall Cognitive Status: Within Functional Limits for tasks assessed                                          Exercises      General Comments General comments (skin integrity, edema, etc.): tremor like activity more pronounced with mobility      Pertinent Vitals/Pain Pain Assessment Pain Assessment: Faces Faces Pain Scale:  Hurts even more Pain Location: R hip and likely hernia region Pain Descriptors / Indicators: Sore Pain Intervention(s): Limited activity within patient's tolerance, Monitored during session, Repositioned, Premedicated before session    Home Living                          Prior Function            PT Goals (current goals can now be found in the care plan section) Acute Rehab PT Goals Patient Stated Goal: family would like  patient to be more independent with mobility before coming home PT Goal Formulation: With family Time For Goal Achievement: 08/29/23 Potential to Achieve Goals: Fair Progress towards PT goals: Progressing toward goals    Frequency    Min 1X/week      PT Plan      Co-evaluation PT/OT/SLP Co-Evaluation/Treatment: Yes Reason for Co-Treatment: Complexity of the patient's impairments (multi-system involvement);To address functional/ADL transfers PT goals addressed during session: Mobility/safety with mobility OT goals addressed during session: ADL's and self-care      AM-PAC PT "6 Clicks" Mobility   Outcome Measure  Help needed turning from your back to your side while in a flat bed without using bedrails?: A Lot Help needed moving from lying on your back to sitting on the side of a flat bed without using bedrails?: A Lot Help needed moving to and from a bed to a chair (including a wheelchair)?: A Lot Help needed standing up from a chair using your arms (e.g., wheelchair or bedside chair)?: Total Help needed to walk in hospital room?: Total Help needed climbing 3-5 steps with a railing? : Total 6 Click Score: 9    End of Session         PT Visit Diagnosis: Unsteadiness on feet (R26.81);Muscle weakness (generalized) (M62.81)     Time: 1610-9604 PT Time Calculation (min) (ACUTE ONLY): 34 min  Charges:    $Therapeutic Activity: 8-22 mins PT General Charges $$ ACUTE PT VISIT: 1 Visit                     Donna Bernard, PT, MPT    Ina Homes 08/17/2023, 1:03 PM

## 2023-08-18 DIAGNOSIS — S72001A Fracture of unspecified part of neck of right femur, initial encounter for closed fracture: Secondary | ICD-10-CM | POA: Diagnosis not present

## 2023-08-18 LAB — GLUCOSE, CAPILLARY
Glucose-Capillary: 149 mg/dL — ABNORMAL HIGH (ref 70–99)
Glucose-Capillary: 173 mg/dL — ABNORMAL HIGH (ref 70–99)
Glucose-Capillary: 152 mg/dL — ABNORMAL HIGH (ref 70–99)
Glucose-Capillary: 117 mg/dL — ABNORMAL HIGH (ref 70–99)

## 2023-08-18 LAB — PROTIME-INR
INR: 2.6 — ABNORMAL HIGH (ref 0.8–1.2)
Prothrombin Time: 27.9 s — ABNORMAL HIGH (ref 11.4–15.2)

## 2023-08-18 MED ORDER — WARFARIN SODIUM 2 MG PO TABS
2.0000 mg | ORAL_TABLET | Freq: Once | ORAL | Status: AC
  Administered 2023-08-18: 2 mg via ORAL
  Filled 2023-08-18: qty 1

## 2023-08-18 NOTE — Progress Notes (Signed)
PHARMACY - ANTICOAGULATION CONSULT NOTE  Pharmacy Consult for warfarin Indication: atrial fibrillation  No Known Allergies  Patient Measurements: Height: 4\' 10"  (147.3 cm) Weight: 74.1 kg (163 lb 5.8 oz) IBW/kg (Calculated) : 40.9  Vital Signs: Temp: 98.7 F (37.1 C) (10/26 0810) BP: 143/65 (10/26 0810) Pulse Rate: 133 (10/26 0810)  Labs: Recent Labs    08/16/23 0302 08/17/23 0431 08/18/23 0640  LABPROT 23.1* 25.3* 27.9*  INR 2.0* 2.3* 2.6*    Estimated Creatinine Clearance: 12.2 mL/min (A) (by C-G formula based on SCr of 3.03 mg/dL (H)).   Medical History: Past Medical History:  Diagnosis Date   Anemia    Atrial fibrillation (HCC)    Chronic kidney disease    Congestive heart failure (CHF) (HCC)    COPD (chronic obstructive pulmonary disease) (HCC)    Diabetes mellitus without complication (HCC)    GERD (gastroesophageal reflux disease)    Hyperlipidemia    Hypertension    Pneumonia    Pulmonary fibrosis (HCC)     Medications:  Warfarin 2 mg Tues, Thurs, Sat, Sun Warfarin 4 mg Mon, Wed, Fri  Last dose 10/21 - 4mg   Assessment: 82 year old female presented with fall and right hip pain. PMH atrial fibrillation on warfarin.  No clinically significant drug interactions noted  Date INR Warfarin Dose  10/21 -- 4mg  (PTA)  10/22 1.7 3mg   10/23 1.6 5 mg  10/24 2.0 2 mg   10/25 2.3 2 mg   10/26 2.6 2 mg     Goal of Therapy:  INR 2-3 Monitor platelets by anticoagulation protocol: Yes   Plan:  INR is therapeutic. Will give warfarin 2 mg (home dose) x 1 tonight. Daily INR. CBC at least every 3 days.   Paschal Dopp, PharmD Clinical Pharmacist  08/18/2023 8:33 AM

## 2023-08-18 NOTE — Plan of Care (Signed)
  Problem: Clinical Measurements: Goal: Ability to maintain clinical measurements within normal limits will improve Outcome: Progressing Goal: Diagnostic test results will improve Outcome: Progressing Goal: Cardiovascular complication will be avoided Outcome: Progressing   Problem: Activity: Goal: Risk for activity intolerance will decrease Outcome: Progressing   Problem: Nutrition: Goal: Adequate nutrition will be maintained Outcome: Progressing   Problem: Pain Management: Goal: General experience of comfort will improve Outcome: Progressing

## 2023-08-18 NOTE — Progress Notes (Signed)
Progress Note    [name]  OZH:[id number] DOB: [birth year]  DOA: 08/14/2023 PCP: Sallyanne Kuster, NP      Brief Narrative:    Medical records reviewed and are as summarized below:  [name] is a 82 y.o. female with medical history significant of PAF on warfarin, off rate control medications due to persistent bradycardia, COPD, pulmonary fibrosis, dementia, CKD stage IV, IDDM, HTN, HLD, legal blindness, brought in by family member after a mechanical fall.  Reportedly, patient fell on her right side and hit her right hip as she was trying to sit down on her bed when she missed the edge of the bed.       Assessment/Plan:   Principal Problem:   Hip fracture (HCC) Active Problems:   Atrial fibrillation (HCC)   Paroxysmal A-fib (HCC)   Acute CHF (congestive heart failure) (HCC)   Closed nondisplaced fracture of greater trochanter of right femur (HCC)    Body mass index is 34.14 kg/m.  (Obesity)   Acute right greater trochanter nondisplaced fracture: Patient was evaluated by orthopedic surgeon, Dr. Audelia Acton.  He recommended conservative management.  Analgesics as needed for pain.  PT recommended discharge to SNF.   Acute on chronic HFpEF: Previously treated with IV Lasix.  Continue torsemide. 2D echo on 08/15/2023 showed EF estimated at 45 to 50% (midrange EF), mild LVH, indeterminate LV diastolic parameters, mildly elevated pulmonary arterial systolic pressure, mild MR, mild to moderate TR, mild aortic stenosis.   Atrial fibrillation with RVR: INR is therapeutic continue warfarin. She is not on long-term rate control drugs because of history of bradycardia. Lupita Leash (daughter) said that they were told patient is at high risk for surgery/anesthesia so they decided not to pursue evaluation for pacemaker placement.   Type II DM with hyperglycemia: Continue insulin glargine.  NovoLog as needed for hyperglycemia   CKD stage IV: Creatinine is stable.   COPD,  pulmonary fibrosis: Compensated.  She is tolerating room air.  Bronchodilators as needed.   Comorbidities include legal blindness, dementia: Continue supportive care     Diet Order             Diet heart healthy/carb modified Room service appropriate? Yes; Fluid consistency: Thin  Diet effective now                            Consultants: Orthopedic surgeon  Procedures: None    Medications:    atorvastatin  10 mg Oral QPM   citalopram  20 mg Oral Daily   donepezil  5 mg Oral q morning   ferrous sulfate  325 mg Oral Q breakfast   influenza vaccine adjuvanted  0.5 mL Intramuscular Tomorrow-1000   insulin aspart  0-15 Units Subcutaneous TID WC   insulin glargine-yfgn  15 Units Subcutaneous Daily   lidocaine  1 patch Transdermal Q24H   linaclotide  145 mcg Oral QHS   linagliptin  5 mg Oral Daily   montelukast  10 mg Oral QHS   pantoprazole  40 mg Oral BID   rOPINIRole  0.5 mg Oral QPM   torsemide  60 mg Oral Daily   warfarin  2 mg Oral ONCE-1600   Warfarin - Pharmacist Dosing Inpatient   Does not apply q1600   Continuous Infusions:   Anti-infectives (From admission, onward)    None              Family Communication/Anticipated D/C date and  plan/Code Status   DVT prophylaxis:  warfarin (COUMADIN) tablet 2 mg     Code Status: Limited: Do not attempt resuscitation (DNR) -DNR-LIMITED -Do Not Intubate/DNI   Family Communication: Plan discussed with Lupita Leash, daughter, Molly Maduro, and son-in-law, at the bedside Disposition Plan: Plan to discharge to SNF   Status is: Inpatient Remains inpatient appropriate because: Awaiting placement to SNF       Subjective:   Interval events noted.  She complains of pain in the right hip.  She had worked with physical therapy earlier this morning and the activity has exacerbated her right hip pain.  Lupita Leash (daughter) and Molly Maduro (son-in-law)  Objective:    Vitals:   08/18/23 0114 08/18/23 0500 08/18/23  0605 08/18/23 0810  BP: 95/64  (!) 111/58 (!) 143/65  Pulse: (!) 117  (!) 115 (!) 133  Resp: 18  17 20   Temp: 98 F (36.7 C)  98.1 F (36.7 C) 98.7 F (37.1 C)  TempSrc:      SpO2: 93%  97% 97%  Weight:  74.1 kg    Height:       No data found.   Intake/Output Summary (Last 24 hours) at 08/18/2023 1055 Last data filed at 08/18/2023 1040 Gross per 24 hour  Intake 120 ml  Output 600 ml  Net -480 ml   Filed Weights   08/16/23 0500 08/17/23 0500 08/18/23 0500  Weight: 72.4 kg 75 kg 74.1 kg    Exam:  GEN: NAD SKIN: Warm and dry EYES: No pallor or icterus ENT: MMM CV: Irregular rate and rhythm, tachycardic PULM: CTA B ABD: soft, ND, NT, +BS CNS: AAO x 3, non focal EXT: Mild right hip tenderness.  Brace on the abdomen and right thigh.   Data Reviewed:   I have personally reviewed following labs and imaging studies:  Labs: Labs show the following:   Basic Metabolic Panel: Recent Labs  Lab 08/14/23 0808 08/15/23 0500  NA 140 136  K 4.1 4.5  CL 104 99  CO2 24 25  GLUCOSE 175* 162*  BUN 34* 39*  CREATININE 2.43* 3.03*  CALCIUM 8.4* 8.7*  MG 1.8  --    GFR Estimated Creatinine Clearance: 12.2 mL/min (A) (by C-G formula based on SCr of 3.03 mg/dL (H)). Liver Function Tests: Recent Labs  Lab 08/14/23 0808  AST 22  ALT 12  ALKPHOS 75  BILITOT 0.6  PROT 6.8  ALBUMIN 3.0*   No results for input(s): "LIPASE", "AMYLASE" in the last 168 hours. No results for input(s): "AMMONIA" in the last 168 hours. Coagulation profile Recent Labs  Lab 08/14/23 0808 08/15/23 0500 08/16/23 0302 08/17/23 0431 08/18/23 0640  INR 1.7* 1.6* 2.0* 2.3* 2.6*    CBC: Recent Labs  Lab 08/14/23 0808  WBC 9.5  NEUTROABS 6.7  HGB 9.6*  HCT 30.8*  MCV 98.1  PLT 226   Cardiac Enzymes: No results for input(s): "CKTOTAL", "CKMB", "CKMBINDEX", "TROPONINI" in the last 168 hours. BNP (last 3 results) No results for input(s): "PROBNP" in the last 8760 hours. CBG: Recent  Labs  Lab 08/17/23 0821 08/17/23 1219 08/17/23 1703 08/17/23 2222 08/18/23 0817  GLUCAP 231* 141* 127* 103* 149*   D-Dimer: No results for input(s): "DDIMER" in the last 72 hours. Hgb A1c: No results for input(s): "HGBA1C" in the last 72 hours. Lipid Profile: No results for input(s): "CHOL", "HDL", "LDLCALC", "TRIG", "CHOLHDL", "LDLDIRECT" in the last 72 hours. Thyroid function studies: No results for input(s): "TSH", "T4TOTAL", "T3FREE", "THYROIDAB" in the last 72  hours.  Invalid input(s): "FREET3" Anemia work up: No results for input(s): "VITAMINB12", "FOLATE", "FERRITIN", "TIBC", "IRON", "RETICCTPCT" in the last 72 hours. Sepsis Labs: Recent Labs  Lab 08/14/23 0808  WBC 9.5    Microbiology No results found for this or any previous visit (from the past 240 hour(s)).  Procedures and diagnostic studies:  No results found.             LOS: 4 days   Iban Utz  [facility]   Pager on [email or web address]. If 7PM-7AM, please contact night-coverage at [email or web address]     08/18/2023, 10:55 AM

## 2023-08-18 NOTE — TOC CM/SW Note (Signed)
Noted recommendations for SNF for short term rehab.  Attempted to speak with patient and daughter, neither available.  Voice message left for daughter.    Kemper Durie RN, MSN, CCM RNCM, TOC Email: [email or web address]

## 2023-08-18 NOTE — Progress Notes (Signed)
Physical Therapy Treatment Patient Details Name: [name] MRN: [id number] DOB: 23-Mar-1941 Today's Date: 08/18/2023   History of Present Illness Patient is a 82 year old female with mechanical fall with tip of the right greater trochanter avulsion fracture with hip abduction brace recommended. History of PAF, bradycardia, COPD, CKD, DM, HTN, HLD.    PT Comments  Pt was long sitting in bed upon arrival. Pt only had only minimal breakfast from tray. HR remains in A fib with rate between 90s-130s bpm. She is alert and agreeable however disoriented x 3. Supportive daughter arrived during session. Pt continues to be severely limited due to pain and weakness. She required max-total assist to achieve EOB sitting. Sat EOB x several minutes prior to returning to return to supine. PT will require +2 assistance for any transfers or OOB activity. Acute PT will continue to follow and progress as able per current POC.     If plan is discharge home, recommend the following: A lot of help with walking and/or transfers;A lot of help with bathing/dressing/bathroom;Assistance with cooking/housework;Help with stairs or ramp for entrance;Assist for transportation     Equipment Recommendations  None recommended by PT       Precautions / Restrictions Precautions Precautions: Fall Required Braces or Orthoses: Other Brace Other Brace: hip abduction brace Restrictions Weight Bearing Restrictions: Yes RLE Weight Bearing: Weight bearing as tolerated Other Position/Activity Restrictions: no active right hip abduction     Mobility  Bed Mobility Overal bed mobility: Needs Assistance  Supine to sit: HOB elevated, Used rails, Max assist, Total assist Sit to supine: Total assist, HOB elevated, Used rails General bed mobility comments: pt continues to require extensive assistance to acheive EOB sitting. Tolerated sitting EOB x ~ 8 minutes prior to needing to returnt o supine. Pt HR in A fib bouncing between 90s  and 130s bpm    Transfers  General transfer comment: Elected not to attempt standing due to pain and limited activity tolerance/       Balance Overall balance assessment: Needs assistance Sitting-balance support: Bilateral upper extremity supported, Feet supported Sitting balance-Leahy Scale: Fair      Cognition Arousal: Alert Behavior During Therapy: Flat affect Overall Cognitive Status: History of cognitive impairments - at baseline    General Comments: Pt is alert and agreeable but overall disoriented x 3. pleasantly confused but did endorse not feeling well earlier this AM           General Comments General comments (skin integrity, edema, etc.): Continued tremors with all mobility      Pertinent Vitals/Pain Pain Assessment Pain Assessment: PAINAD Breathing: occasional labored breathing, short period of hyperventilation Negative Vocalization: occasional moan/groan, low speech, negative/disapproving quality Facial Expression: facial grimacing Body Language: tense, distressed pacing, fidgeting Consolability: distracted or reassured by voice/touch PAINAD Score: 6 Pain Location: R hip Pain Descriptors / Indicators: Discomfort Pain Intervention(s): Limited activity within patient's tolerance, Monitored during session, Premedicated before session, Repositioned     PT Goals (current goals can now be found in the care plan section) Acute Rehab PT Goals Patient Stated Goal: none stated Progress towards PT goals: Not progressing toward goals - comment    Frequency    Min 1X/week           Co-evaluation     PT goals addressed during session: Mobility/safety with mobility        AM-PAC PT "6 Clicks" Mobility   Outcome Measure  Help needed turning from your back to your side while  in a flat bed without using bedrails?: A Lot Help needed moving from lying on your back to sitting on the side of a flat bed without using bedrails?: A Lot Help needed moving to  and from a bed to a chair (including a wheelchair)?: Total Help needed standing up from a chair using your arms (e.g., wheelchair or bedside chair)?: Total Help needed to walk in hospital room?: Total Help needed climbing 3-5 steps with a railing? : Total 6 Click Score: 8    End of Session   Activity Tolerance: Patient limited by pain;Patient limited by fatigue;Other (comment) (HR) Patient left: in bed;with call bell/phone within reach;with bed alarm set Nurse Communication: Mobility status PT Visit Diagnosis: Unsteadiness on feet (R26.81);Muscle weakness (generalized) (M62.81)     Time: 1324-4010 PT Time Calculation (min) (ACUTE ONLY): 13 min  Charges:    $Therapeutic Activity: 8-22 mins PT General Charges $$ ACUTE PT VISIT: 1 Visit                     Jetta Lout PTA 08/18/23, 10:56 AM

## 2023-08-18 NOTE — Plan of Care (Signed)
  Problem: Education: Goal: Ability to describe self-care measures that may prevent or decrease complications (Diabetes Survival Skills Education) will improve 08/18/2023 1832 by Danella Sensing, RN Outcome: Progressing 08/18/2023 1832 by Danella Sensing, RN Outcome: Progressing Goal: Individualized Educational Video(s) 08/18/2023 1832 by Danella Sensing, RN Outcome: Progressing 08/18/2023 1832 by Danella Sensing, RN Outcome: Progressing   Problem: Coping: Goal: Ability to adjust to condition or change in health will improve 08/18/2023 1832 by Danella Sensing, RN Outcome: Progressing 08/18/2023 1832 by Danella Sensing, RN Outcome: Progressing   Problem: Skin Integrity: Goal: Risk for impaired skin integrity will decrease 08/18/2023 1832 by Danella Sensing, RN Outcome: Progressing 08/18/2023 1832 by Danella Sensing, RN Outcome: Progressing   Problem: Education: Goal: Knowledge of General Education information will improve Description: Including pain rating scale, medication(s)/side effects and non-pharmacologic comfort measures 08/18/2023 1832 by Danella Sensing, RN Outcome: Progressing 08/18/2023 1832 by Danella Sensing, RN Outcome: Progressing

## 2023-08-19 DIAGNOSIS — S72001A Fracture of unspecified part of neck of right femur, initial encounter for closed fracture: Secondary | ICD-10-CM | POA: Diagnosis not present

## 2023-08-19 LAB — PROTIME-INR
INR: 3.3 — ABNORMAL HIGH (ref 0.8–1.2)
Prothrombin Time: 33.6 s — ABNORMAL HIGH (ref 11.4–15.2)

## 2023-08-19 LAB — GLUCOSE, CAPILLARY
Glucose-Capillary: 132 mg/dL — ABNORMAL HIGH (ref 70–99)
Glucose-Capillary: 156 mg/dL — ABNORMAL HIGH (ref 70–99)
Glucose-Capillary: 96 mg/dL (ref 70–99)

## 2023-08-19 LAB — CBC
HCT: 30 % — ABNORMAL LOW (ref 36.0–46.0)
Hemoglobin: 10 g/dL — ABNORMAL LOW (ref 12.0–15.0)
MCH: 30.3 pg (ref 26.0–34.0)
MCHC: 33.3 g/dL (ref 30.0–36.0)
MCV: 90.9 fL (ref 80.0–100.0)
Platelets: 235 10*3/uL (ref 150–400)
RBC: 3.3 MIL/uL — ABNORMAL LOW (ref 3.87–5.11)
RDW: 14.5 % (ref 11.5–15.5)
WBC: 12.8 10*3/uL — ABNORMAL HIGH (ref 4.0–10.5)
nRBC: 0 % (ref 0.0–0.2)

## 2023-08-19 MED ORDER — SODIUM CHLORIDE 0.9 % IV BOLUS
500.0000 mL | Freq: Once | INTRAVENOUS | Status: AC
  Administered 2023-08-19: 500 mL via INTRAVENOUS

## 2023-08-19 NOTE — TOC Progression Note (Signed)
Transition of Care [facility]) - Progression Note    Patient Details  Name: [name] MRN: [id number] Date of Birth: October 21, 1941  Transition of [facility]) CM/SW Contact  Kemper Durie, RN Phone Number: 08/19/2023, 4:28 PM  Clinical Narrative:     Met with patient at the bedside to discuss SNF options, does not appear to be oriented enough to make decisions solely.  Called daughter as she was not at the bedside, voice message left.        Expected Discharge Plan and Services                                               Social Determinants of Health (SDOH) Interventions SDOH Screenings   Food Insecurity: No Food Insecurity (08/14/2023)  Housing: Low Risk  (08/14/2023)  Transportation Needs: No Transportation Needs (08/14/2023)  Utilities: Not At Risk (08/14/2023)  Alcohol Screen: Low Risk  (03/14/2022)  Depression (PHQ2-9): Low Risk  (08/06/2023)  Tobacco Use: Medium Risk (08/14/2023)    Readmission Risk Interventions    06/29/2021    8:45 AM 05/27/2021    2:34 PM  Readmission Risk Prevention Plan  Transportation Screening Complete Complete  Medication Review (RN Care Manager) Complete Complete  PCP or Specialist appointment within 3-5 days of discharge Complete Complete  HRI or Home Care Consult Complete Complete  SW Recovery Care/Counseling Consult Complete Complete  Palliative Care Screening Not Applicable Complete  Skilled Nursing Facility Not Applicable Complete

## 2023-08-19 NOTE — Plan of Care (Signed)
  Problem: Education: Goal: Ability to describe self-care measures that may prevent or decrease complications (Diabetes Survival Skills Education) will improve Outcome: Progressing   Problem: Coping: Goal: Ability to adjust to condition or change in health will improve Outcome: Progressing   Problem: Health Behavior/Discharge Planning: Goal: Ability to identify and utilize available resources and services will improve Outcome: Progressing Goal: Ability to manage health-related needs will improve Outcome: Progressing   Problem: Metabolic: Goal: Ability to maintain appropriate glucose levels will improve Outcome: Progressing

## 2023-08-19 NOTE — Progress Notes (Signed)
PHARMACY - ANTICOAGULATION CONSULT NOTE  Pharmacy Consult for warfarin Indication: atrial fibrillation  No Known Allergies  Patient Measurements: Height: 4\' 10"  (147.3 cm) Weight: 74.6 kg (164 lb 7.4 oz) IBW/kg (Calculated) : 40.9  Vital Signs: Temp: 98.6 F (37 C) (10/27 0755) Temp Source: Oral (10/26 2104) BP: 108/64 (10/27 0755) Pulse Rate: 119 (10/27 0755)  Labs: Recent Labs    08/17/23 0431 08/18/23 0640 08/19/23 0318  HGB  --   --  10.0*  HCT  --   --  30.0*  PLT  --   --  235  LABPROT 25.3* 27.9* 33.6*  INR 2.3* 2.6* 3.3*    Estimated Creatinine Clearance: 12.3 mL/min (A) (by C-G formula based on SCr of 3.03 mg/dL (H)).   Medical History: Past Medical History:  Diagnosis Date   Anemia    Atrial fibrillation (HCC)    Chronic kidney disease    Congestive heart failure (CHF) (HCC)    COPD (chronic obstructive pulmonary disease) (HCC)    Diabetes mellitus without complication (HCC)    GERD (gastroesophageal reflux disease)    Hyperlipidemia    Hypertension    Pneumonia    Pulmonary fibrosis (HCC)     Medications:  Warfarin 2 mg Tues, Thurs, Sat, Sun Warfarin 4 mg Mon, Wed, Fri Total weekly warfarin: 20 mg Average daily dose ~3 mg Last dose at home prior to admission 10/21 - 4mg   Assessment: 82 year old female presented with fall and right hip pain. PMH atrial fibrillation on warfarin.  No clinically significant drug interactions noted  Date INR Warfarin Dose  10/21 -- 4mg  (PTA)  10/22 1.7 3 mg  10/23 1.6 5 mg  10/24 2.0 2 mg   10/25 2.3 2 mg   10/26 2.6 2 mg  10/27 3.3 HOLD     Goal of Therapy:  INR 2-3 Monitor platelets by anticoagulation protocol: Yes   Plan:  Update 10/29: Pharmacist covering unit today accidentally addended yesterday's note instead of making a new note for today. I have re-added pharmacist's original plan yesterday as denoted below in blue:   INR is slightly supratherapeutic. Will hold warfarin dose tonight.  Daily INR. CBC at least every 3 days.    Paschal Dopp, PharmD Clinical Pharmacist  08/19/2023 8:30 AM  Thank you for allowing pharmacy to be part of this patient's care.  Will M. Dareen Piano, PharmD Clinical Pharmacist 08/20/2023 9:24 AM

## 2023-08-19 NOTE — Progress Notes (Addendum)
Progress Note    [name]  QQV:[id number] DOB: 12-21-1940  DOA: 08/14/2023 PCP: Sallyanne Kuster, NP      Brief Narrative:    Medical records reviewed and are as summarized below:  [name] is a 82 y.o. female with medical history significant of PAF on warfarin, off rate control medications due to persistent bradycardia, COPD, pulmonary fibrosis, dementia, CKD stage IV, IDDM, HTN, HLD, legal blindness, brought in by family member after a mechanical fall.  Reportedly, patient fell on her right side and hit her right hip as she was trying to sit down on her bed when she missed the edge of the bed.       Assessment/Plan:   Principal Problem:   Hip fracture (HCC) Active Problems:   Atrial fibrillation (HCC)   Paroxysmal A-fib (HCC)   Acute CHF (congestive heart failure) (HCC)   Closed nondisplaced fracture of greater trochanter of right femur (HCC)    Body mass index is 34.37 kg/m.  (Obesity)   Acute right greater trochanter nondisplaced fracture: Patient was evaluated by orthopedic surgeon, Dr. Audelia Acton.  He recommended conservative management.  Analgesics as needed for pain.  PT recommended discharge to SNF.   Acute on chronic HFpEF: Previously treated with IV Lasix.   Torsemide held because of hypotension 2D echo on 08/15/2023 showed EF estimated at 45 to 50% (midrange EF), mild LVH, indeterminate LV diastolic parameters, mildly elevated pulmonary arterial systolic pressure, mild MR, mild to moderate TR, mild aortic stenosis.   Atrial fibrillation with RVR: INR is therapeutic continue warfarin. She is not on long-term rate control drugs because of history of bradycardia. Lupita Leash (daughter) said that they were told patient is at high risk for surgery/anesthesia so they decided not to pursue evaluation for pacemaker placement.   Type II DM with hyperglycemia: Continue insulin glargine.  NovoLog as needed for hyperglycemia   CKD stage IV: Creatinine is  stable.   COPD, pulmonary fibrosis: Compensated.  She is tolerating room air.  Bronchodilators as needed.   Hypotension: BP dropped to 82/51.  She is asymptomatic.  It appears she is not drinking enough fluids.  Given normal saline bolus of 500 mL and monitor BP.  Hold torsemide for now. No urinary or respiratory symptoms.  Urine is cloudy mild this could be due to dehydration.  Continue to monitor.   Acute urinary retention: In and out urethral catheterization on 08/18/2023 produced 830 mL of urine.  Bladder scan today showed 550 mL of urine.  Repeat in and out urethral catheterization.  She may need indwelling Foley catheter if she continues to retain urine.   Comorbidities include legal blindness, dementia: Continue supportive care     Diet Order             DIET - DYS 1 Room service appropriate? Yes; Fluid consistency: Thin  Diet effective now                            Consultants: Orthopedic surgeon  Procedures: None    Medications:    atorvastatin  10 mg Oral QPM   citalopram  20 mg Oral Daily   donepezil  5 mg Oral q morning   ferrous sulfate  325 mg Oral Q breakfast   influenza vaccine adjuvanted  0.5 mL Intramuscular Tomorrow-1000   insulin aspart  0-15 Units Subcutaneous TID WC   insulin glargine-yfgn  15 Units Subcutaneous Daily   lidocaine  1 patch Transdermal Q24H   linaclotide  145 mcg Oral QHS   linagliptin  5 mg Oral Daily   montelukast  10 mg Oral QHS   pantoprazole  40 mg Oral BID   rOPINIRole  0.5 mg Oral QPM   Warfarin - Pharmacist Dosing Inpatient   Does not apply q1600   Continuous Infusions:  sodium chloride       Anti-infectives (From admission, onward)    None              Family Communication/Anticipated D/C date and plan/Code Status   DVT prophylaxis:      Code Status: Limited: Do not attempt resuscitation (DNR) -DNR-LIMITED -Do Not Intubate/DNI   Family Communication: None Disposition Plan: Plan to  discharge to SNF   Status is: Inpatient Remains inpatient appropriate because: Awaiting placement to SNF       Subjective:   Interval events noted.  She complains of pain in the right hip.  She is having problems with urinary retention.  In-N-Out cath was done with removal of 830 mL of urine on 08/18/2023.  Objective:    Vitals:   08/19/23 0740 08/19/23 0755 08/19/23 1156 08/19/23 1504  BP: (!) 92/33 108/64 101/63 (!) 82/51  Pulse: (!) 109 (!) 119 (!) 102 (!) 103  Resp: 15 15 16    Temp: 98.5 F (36.9 C) 98.6 F (37 C) 98 F (36.7 C)   TempSrc:   Oral   SpO2: 95% 97% 96%   Weight:      Height:       No data found.   Intake/Output Summary (Last 24 hours) at 08/19/2023 1527 Last data filed at 08/19/2023 1448 Gross per 24 hour  Intake 960 ml  Output 1830 ml  Net -870 ml   Filed Weights   08/17/23 0500 08/18/23 0500 08/19/23 0425  Weight: 75 kg 74.1 kg 74.6 kg    Exam:   GEN: NAD SKIN: Warm and dry EYES: No pallor or icterus ENT: MMM CV: RRR PULM: CTA B ABD: soft, ND, NT, +BS CNS: AAO x 2 (person and place), non focal EXT: Mild right hip tenderness.  Brace on right hip.      Data Reviewed:   I have personally reviewed following labs and imaging studies:  Labs: Labs show the following:   Basic Metabolic Panel: Recent Labs  Lab 08/14/23 0808 08/15/23 0500  NA 140 136  K 4.1 4.5  CL 104 99  CO2 24 25  GLUCOSE 175* 162*  BUN 34* 39*  CREATININE 2.43* 3.03*  CALCIUM 8.4* 8.7*  MG 1.8  --    GFR Estimated Creatinine Clearance: 12.3 mL/min (A) (by C-G formula based on SCr of 3.03 mg/dL (H)). Liver Function Tests: Recent Labs  Lab 08/14/23 0808  AST 22  ALT 12  ALKPHOS 75  BILITOT 0.6  PROT 6.8  ALBUMIN 3.0*   No results for input(s): "LIPASE", "AMYLASE" in the last 168 hours. No results for input(s): "AMMONIA" in the last 168 hours. Coagulation profile Recent Labs  Lab 08/15/23 0500 08/16/23 0302 08/17/23 0431 08/18/23 0640  08/19/23 0318  INR 1.6* 2.0* 2.3* 2.6* 3.3*    CBC: Recent Labs  Lab 08/14/23 0808 08/19/23 0318  WBC 9.5 12.8*  NEUTROABS 6.7  --   HGB 9.6* 10.0*  HCT 30.8* 30.0*  MCV 98.1 90.9  PLT 226 235   Cardiac Enzymes: No results for input(s): "CKTOTAL", "CKMB", "CKMBINDEX", "TROPONINI" in the last 168 hours. BNP (last 3 results) No  results for input(s): "PROBNP" in the last 8760 hours. CBG: Recent Labs  Lab 08/18/23 1205 08/18/23 1622 08/18/23 2110 08/19/23 0737 08/19/23 1153  GLUCAP 173* 152* 117* 132* 156*   D-Dimer: No results for input(s): "DDIMER" in the last 72 hours. Hgb A1c: No results for input(s): "HGBA1C" in the last 72 hours. Lipid Profile: No results for input(s): "CHOL", "HDL", "LDLCALC", "TRIG", "CHOLHDL", "LDLDIRECT" in the last 72 hours. Thyroid function studies: No results for input(s): "TSH", "T4TOTAL", "T3FREE", "THYROIDAB" in the last 72 hours.  Invalid input(s): "FREET3" Anemia work up: No results for input(s): "VITAMINB12", "FOLATE", "FERRITIN", "TIBC", "IRON", "RETICCTPCT" in the last 72 hours. Sepsis Labs: Recent Labs  Lab 08/14/23 0808 08/19/23 0318  WBC 9.5 12.8*    Microbiology No results found for this or any previous visit (from the past 240 hour(s)).  Procedures and diagnostic studies:  No results found.             LOS: 5 days   Delainie Chavana  [facility]   Pager on [email or web address]. If 7PM-7AM, please contact night-coverage at [email or web address]     08/19/2023, 3:27 PM

## 2023-08-19 NOTE — Progress Notes (Signed)
   08/19/23 1504  Assess: MEWS Score  BP (!) 82/51  Pulse Rate (!) 103  Resp 18  Level of Consciousness Alert  SpO2 96 %  O2 Device Room Air  Assess: if the MEWS score is Yellow or Red  Were vital signs accurate and taken at a resting state? Yes  Does the patient meet 2 or more of the SIRS criteria? No  Does the patient have a confirmed or suspected source of infection? No  MEWS guidelines implemented  Yes, yellow  Treat  MEWS Interventions Considered administering scheduled or prn medications/treatments as ordered  Take Vital Signs  Increase Vital Sign Frequency  Yellow: Q2hr x1, continue Q4hrs until patient remains green for 12hrs  Escalate  MEWS: Escalate Yellow: Discuss with charge nurse and consider notifying provider and/or RRT  Notify: Charge Nurse/RN  Name of Charge Nurse/RN Notified Janie, RN  Provider Notification  Provider Name/Title Louann Sjogren MD  Date Provider Notified 08/19/23  Time Provider Notified 1530  Method of Notification Page  Notification Reason Other (Comment) (Low BP, Yellow MEWS)  Provider response See new orders  Date of Provider Response 08/19/23  Time of Provider Response 1535

## 2023-08-19 NOTE — Plan of Care (Signed)
  Problem: Clinical Measurements: Goal: Ability to maintain clinical measurements within normal limits will improve Outcome: Progressing Goal: Cardiovascular complication will be avoided Outcome: Progressing   Problem: Activity: Goal: Risk for activity intolerance will decrease Outcome: Progressing   Problem: Nutrition: Goal: Adequate nutrition will be maintained Outcome: Progressing   Problem: Pain Management: Goal: General experience of comfort will improve Outcome: Progressing

## 2023-08-19 NOTE — Progress Notes (Signed)
   08/19/23 0740  Assess: MEWS Score  Temp 98.5 F (36.9 C)  BP (!) 92/33  MAP (mmHg) (!) 44  Pulse Rate (!) 109  Resp 15  SpO2 95 %  O2 Device Room Air  Assess: MEWS Score  MEWS Temp 0  MEWS Systolic 1  MEWS Pulse 1  MEWS RR 0  MEWS LOC 0  MEWS Score 2  MEWS Score Color Yellow  Assess: if the MEWS score is Yellow or Red  Were vital signs accurate and taken at a resting state? Yes  Does the patient meet 2 or more of the SIRS criteria? Yes  Does the patient have a confirmed or suspected source of infection? No  Notify: Charge Nurse/RN  Name of Charge Nurse/RN Notified Art therapist  Provider Notification  Provider Name/Title Louann Sjogren MD  Date Provider Notified 08/19/23  Time Provider Notified 0820  Method of Notification Page  Notification Reason Other (Comment) (Yellow MEWS)  Assess: SIRS CRITERIA  SIRS Temperature  0  SIRS Pulse 1  SIRS Respirations  0  SIRS WBC 0  SIRS Score Sum  1

## 2023-08-20 ENCOUNTER — Inpatient Hospital Stay: Payer: Medicare HMO

## 2023-08-20 DIAGNOSIS — S72001A Fracture of unspecified part of neck of right femur, initial encounter for closed fracture: Secondary | ICD-10-CM | POA: Diagnosis not present

## 2023-08-20 LAB — COMPREHENSIVE METABOLIC PANEL
ALT: 13 U/L (ref 0–44)
AST: 42 U/L — ABNORMAL HIGH (ref 15–41)
Albumin: 2.8 g/dL — ABNORMAL LOW (ref 3.5–5.0)
Alkaline Phosphatase: 80 U/L (ref 38–126)
Anion gap: 14 (ref 5–15)
BUN: 95 mg/dL — ABNORMAL HIGH (ref 8–23)
CO2: 20 mmol/L — ABNORMAL LOW (ref 22–32)
Calcium: 8.2 mg/dL — ABNORMAL LOW (ref 8.9–10.3)
Chloride: 97 mmol/L — ABNORMAL LOW (ref 98–111)
Creatinine, Ser: 5.21 mg/dL — ABNORMAL HIGH (ref 0.44–1.00)
GFR, Estimated: 8 mL/min — ABNORMAL LOW (ref >60)
Glucose, Bld: 133 mg/dL — ABNORMAL HIGH (ref 70–99)
Potassium: 4.2 mmol/L (ref 3.5–5.1)
Sodium: 131 mmol/L — ABNORMAL LOW (ref 135–145)
Total Bilirubin: 1.1 mg/dL (ref 0.3–1.2)
Total Protein: 7.2 g/dL (ref 6.5–8.1)

## 2023-08-20 LAB — CBC WITH DIFFERENTIAL/PLATELET
Abs Immature Granulocytes: 0.17 10*3/uL — ABNORMAL HIGH (ref 0.00–0.07)
Basophils Absolute: 0 10*3/uL (ref 0.0–0.1)
Basophils Relative: 0 %
Eosinophils Absolute: 0 10*3/uL (ref 0.0–0.5)
Eosinophils Relative: 0 %
HCT: 33.2 % — ABNORMAL LOW (ref 36.0–46.0)
Hemoglobin: 10.7 g/dL — ABNORMAL LOW (ref 12.0–15.0)
Immature Granulocytes: 1 %
Lymphocytes Relative: 4 %
Lymphs Abs: 0.7 10*3/uL (ref 0.7–4.0)
MCH: 29.9 pg (ref 26.0–34.0)
MCHC: 32.2 g/dL (ref 30.0–36.0)
MCV: 92.7 fL (ref 80.0–100.0)
Monocytes Absolute: 0.8 10*3/uL (ref 0.1–1.0)
Monocytes Relative: 5 %
Neutro Abs: 14.9 10*3/uL — ABNORMAL HIGH (ref 1.7–7.7)
Neutrophils Relative %: 90 %
Platelets: 275 10*3/uL (ref 150–400)
RBC: 3.58 MIL/uL — ABNORMAL LOW (ref 3.87–5.11)
RDW: 14.3 % (ref 11.5–15.5)
WBC: 16.6 10*3/uL — ABNORMAL HIGH (ref 4.0–10.5)
nRBC: 0 % (ref 0.0–0.2)

## 2023-08-20 LAB — GLUCOSE, CAPILLARY
Glucose-Capillary: 113 mg/dL — ABNORMAL HIGH (ref 70–99)
Glucose-Capillary: 118 mg/dL — ABNORMAL HIGH (ref 70–99)
Glucose-Capillary: 121 mg/dL — ABNORMAL HIGH (ref 70–99)
Glucose-Capillary: 137 mg/dL — ABNORMAL HIGH (ref 70–99)
Glucose-Capillary: 164 mg/dL — ABNORMAL HIGH (ref 70–99)

## 2023-08-20 LAB — PROTIME-INR
INR: 3.9 — ABNORMAL HIGH (ref 0.8–1.2)
Prothrombin Time: 38.5 s — ABNORMAL HIGH (ref 11.4–15.2)

## 2023-08-20 MED ORDER — LACTATED RINGERS IV SOLN: INTRAVENOUS | Status: AC

## 2023-08-20 NOTE — Progress Notes (Signed)
Physical Therapy Treatment Patient Details Name: [name] MRN: [id number] DOB: 11-02-1940 Today's Date: 08/20/2023   History of Present Illness Patient is a 82 year old female with mechanical fall with tip of the right greater trochanter avulsion fracture with hip abduction brace recommended. History of PAF, bradycardia, COPD, CKD, DM, HTN, HLD.    PT Comments  Patient was lethargic and son requesting for PT to work with the patient at bed level. Assisted patient with rolling/repositioning to the left with cues for technique. Mod A required. Therapeutic exercises performed to LE with no right hip abduction. Limited progress overall with mobility and increased independence. Anticipate the patient will require continued rehabilitation after this hospital stay <3 hour/day.    If plan is discharge home, recommend the following: A lot of help with walking and/or transfers;A lot of help with bathing/dressing/bathroom;Assistance with cooking/housework;Help with stairs or ramp for entrance;Assist for transportation   Can travel by private vehicle     No  Equipment Recommendations  None recommended by PT    Recommendations for Other Services       Precautions / Restrictions Precautions Precautions: Fall Precaution Comments: vision impaired Required Braces or Orthoses: Other Brace Other Brace: hip abduction brace Restrictions Weight Bearing Restrictions: Yes RLE Weight Bearing: Weight bearing as tolerated Other Position/Activity Restrictions: no active right hip abduction     Mobility  Bed Mobility Overal bed mobility: Needs Assistance Bed Mobility: Rolling Rolling: Mod assist         General bed mobility comments: Mod A for rolling to the left. cues for technique with reaching across midline. encouraged frequent repositioning for skin integrity. son requested to defer getting patient out of bed due to lethargy    Transfers                         Ambulation/Gait                   Stairs             Wheelchair Mobility     Tilt Bed    Modified Rankin (Stroke Patients Only)       Balance                                            Cognition Arousal: Alert, Lethargic Behavior During Therapy: Flat affect Overall Cognitive Status: History of cognitive impairments - at baseline                                          Exercises General Exercises - Lower Extremity Ankle Circles/Pumps: AROM, Strengthening, Both, 10 reps, Supine, AAROM Heel Slides: AROM, AAROM, Strengthening, Left, 10 reps, Supine Hip ABduction/ADduction: AROM, AAROM, Strengthening, Left, 10 reps, Supine Other Exercises Other Exercises: cues for exercise techniques. no abduction performed on RLE with exercises in bed. wash cloth placed on right hip as a barrier to the brace per son request.    General Comments        Pertinent Vitals/Pain Pain Assessment Pain Assessment: No/denies pain    Home Living                          Prior Function  PT Goals (current goals can now be found in the care plan section) Acute Rehab PT Goals Patient Stated Goal: none stated PT Goal Formulation: With family Time For Goal Achievement: 08/29/23 Potential to Achieve Goals: Fair Progress towards PT goals: Progressing toward goals    Frequency    Min 1X/week      PT Plan      Co-evaluation              AM-PAC PT "6 Clicks" Mobility   Outcome Measure  Help needed turning from your back to your side while in a flat bed without using bedrails?: A Lot Help needed moving from lying on your back to sitting on the side of a flat bed without using bedrails?: A Lot Help needed moving to and from a bed to a chair (including a wheelchair)?: Total Help needed standing up from a chair using your arms (e.g., wheelchair or bedside chair)?: Total Help needed to walk in hospital room?:  Total Help needed climbing 3-5 steps with a railing? : Total 6 Click Score: 8    End of Session   Activity Tolerance: Patient limited by lethargy Patient left: in bed;with call bell/phone within reach;with bed alarm set;with family/visitor present   PT Visit Diagnosis: Unsteadiness on feet (R26.81);Muscle weakness (generalized) (M62.81)     Time: 5638-7564 PT Time Calculation (min) (ACUTE ONLY): 19 min  Charges:    $Therapeutic Activity: 8-22 mins PT General Charges $$ ACUTE PT VISIT: 1 Visit                     Donna Bernard, PT, MPT    Ina Homes 08/20/2023, 2:41 PM

## 2023-08-20 NOTE — Progress Notes (Addendum)
Progress Note    [name]  ZOX:[id number] DOB: 1941-08-11  DOA: 08/14/2023 PCP: Sallyanne Kuster, NP      Brief Narrative:    Medical records reviewed and are as summarized below:  [name] is a 82 y.o. female with medical history significant of PAF on warfarin, off rate control medications due to persistent bradycardia, COPD, pulmonary fibrosis, dementia, CKD stage IV, IDDM, HTN, HLD, legal blindness, brought in by family member after a mechanical fall.  Reportedly, patient fell on her right side and hit her right hip as she was trying to sit down on her bed when she missed the edge of the bed.       Assessment/Plan:   Principal Problem:   Hip fracture (HCC) Active Problems:   Atrial fibrillation (HCC)   Paroxysmal A-fib (HCC)   Acute CHF (congestive heart failure) (HCC)   Acute renal failure superimposed on stage 4 chronic kidney disease (HCC)   Closed nondisplaced fracture of greater trochanter of right femur (HCC)    Body mass index is 36.35 kg/m.  (Obesity)   Acute right greater trochanter nondisplaced fracture: Patient was evaluated by orthopedic surgeon, Dr. Audelia Acton.  He recommended conservative management.  Analgesics as needed for pain.  PT recommended discharge to SNF.   Acute on chronic HFpEF: Previously treated with IV Lasix.   Torsemide has been discontinued because of AKI. 2D echo on 08/15/2023 showed EF estimated at 45 to 50% (midrange EF), mild LVH, indeterminate LV diastolic parameters, mildly elevated pulmonary arterial systolic pressure, mild MR, mild to moderate TR, mild aortic stenosis.   Atrial fibrillation with RVR: INR is therapeutic.  Pharmacist to adjust warfarin dose accordingly per protocol.  She is not on long-term rate control drugs because of history of bradycardia. Lupita Leash (daughter) said that they were told patient is at high risk for surgery/anesthesia so they decided not to pursue evaluation for pacemaker  placement.   Type II DM with hyperglycemia: Hold insulin glargine because of AKI to minimize risk of hypoglycemia.  NovoLog as needed for hyperglycemia   AKI on CKD stage IV: Creatinine is up from 3 to 5.2.  Urine output is adequate.  Of note, patient was hypotensive with systolic BP in the 80s on 08/19/2023. Start maintenance IV fluids with Ringer's lactate infusion and monitor BMP. Renal ultrasound ordered today shows previous right nephrectomy, slight fullness of left renal collecting system and contracted urinary bladder. Hyponatremia: Continue IV fluids   COPD, pulmonary fibrosis: Compensated.  She is tolerating room air.  Bronchodilators as needed.   Hypotension: BP has improved.  No fever, chills, urinary, GI or respiratory symptoms.   Acute urinary retention: Indwelling Foley catheter was placed on 08/19/2023 after multiple in and out urethral catheterizations.  Monitor urine output given AKI.   Comorbidities include legal blindness, dementia: Continue supportive care    Plan of care was discussed with Mr. Read Drivers, son, at the bedside.  He has been informed about AKI and recent events.  We discussed whether patient and family will be interested in hemodialysis in the unlikely event kidney function fails to improve.  He said he will discuss it with his 3 siblings.    Diet Order             DIET - DYS 1 Room service appropriate? Yes; Fluid consistency: Thin  Diet effective now  Consultants: Orthopedic surgeon  Procedures: None    Medications:    atorvastatin  10 mg Oral QPM   citalopram  20 mg Oral Daily   donepezil  5 mg Oral q morning   ferrous sulfate  325 mg Oral Q breakfast   influenza vaccine adjuvanted  0.5 mL Intramuscular Tomorrow-1000   insulin aspart  0-15 Units Subcutaneous TID WC   lidocaine  1 patch Transdermal Q24H   linaclotide  145 mcg Oral QHS   montelukast  10 mg Oral QHS   pantoprazole  40 mg  Oral BID   rOPINIRole  0.5 mg Oral QPM   Warfarin - Pharmacist Dosing Inpatient   Does not apply q1600   Continuous Infusions:  lactated ringers 125 mL/hr at 08/20/23 3875     Anti-infectives (From admission, onward)    None              Family Communication/Anticipated D/C date and plan/Code Status   DVT prophylaxis:      Code Status: Limited: Do not attempt resuscitation (DNR) -DNR-LIMITED -Do Not Intubate/DNI   Family Communication: Plan discussed with Mr. Read Drivers, son, at the bedside Disposition Plan: Plan to discharge to SNF   Status is: Inpatient Remains inpatient appropriate because: Awaiting placement to SNF       Subjective:   Interval events noted.  Patient has no complaints.  Mr. Read Drivers son, was at the bedside and said the patient has been more sleepy than usual and she is not eating much.    Objective:    Vitals:   08/20/23 0050 08/20/23 0500 08/20/23 0758 08/20/23 0800  BP: 99/81  (!) 107/56 127/72  Pulse: 80  (!) 127 (!) 119  Resp:   20   Temp: 98.3 F (36.8 C)  98.4 F (36.9 C) 98.4 F (36.9 C)  TempSrc:   Oral Oral  SpO2:   (!) 89% 95%  Weight:  78.9 kg    Height:       No data found.   Intake/Output Summary (Last 24 hours) at 08/20/2023 1438 Last data filed at 08/20/2023 0531 Gross per 24 hour  Intake 654.16 ml  Output 1825 ml  Net -1170.84 ml   Filed Weights   08/18/23 0500 08/19/23 0425 08/20/23 0500  Weight: 74.1 kg 74.6 kg 78.9 kg    Exam:   GEN: NAD SKIN: Warm and dry EYES: No pallor or icterus ENT: MMM CV: RRR PULM: CTA B ABD: soft, ND, NT, +BS CNS: AAO x 2 (person and place), non focal EXT: Mild right hip tenderness GU: Foley catheter with dark urine   Data Reviewed:   I have personally reviewed following labs and imaging studies:  Labs: Labs show the following:   Basic Metabolic Panel: Recent Labs  Lab 08/14/23 0808 08/15/23 0500 08/20/23 0442  NA 140 136 131*  K 4.1 4.5 4.2   CL 104 99 97*  CO2 24 25 20*  GLUCOSE 175* 162* 133*  BUN 34* 39* 95*  CREATININE 2.43* 3.03* 5.21*  CALCIUM 8.4* 8.7* 8.2*  MG 1.8  --   --    GFR Estimated Creatinine Clearance: 7.4 mL/min (A) (by C-G formula based on SCr of 5.21 mg/dL (H)). Liver Function Tests: Recent Labs  Lab 08/14/23 0808 08/20/23 0442  AST 22 42*  ALT 12 13  ALKPHOS 75 80  BILITOT 0.6 1.1  PROT 6.8 7.2  ALBUMIN 3.0* 2.8*   No results for input(s): "LIPASE", "AMYLASE" in the last 168 hours.  No results for input(s): "AMMONIA" in the last 168 hours. Coagulation profile Recent Labs  Lab 08/16/23 0302 08/17/23 0431 08/18/23 0640 08/19/23 0318 09/12/2023 0442  INR 2.0* 2.3* 2.6* 3.3* 3.9*    CBC: Recent Labs  Lab 08/14/23 0808 08/19/23 0318 2023/09/12 0442  WBC 9.5 12.8* 16.6*  NEUTROABS 6.7  --  14.9*  HGB 9.6* 10.0* 10.7*  HCT 30.8* 30.0* 33.2*  MCV 98.1 90.9 92.7  PLT 226 235 275   Cardiac Enzymes: No results for input(s): "CKTOTAL", "CKMB", "CKMBINDEX", "TROPONINI" in the last 168 hours. BNP (last 3 results) No results for input(s): "PROBNP" in the last 8760 hours. CBG: Recent Labs  Lab 08/19/23 1153 08/19/23 1630 September 12, 2023 0034 12-Sep-2023 0802 09-12-2023 1143  GLUCAP 156* 96 118* 137* 164*   D-Dimer: No results for input(s): "DDIMER" in the last 72 hours. Hgb A1c: No results for input(s): "HGBA1C" in the last 72 hours. Lipid Profile: No results for input(s): "CHOL", "HDL", "LDLCALC", "TRIG", "CHOLHDL", "LDLDIRECT" in the last 72 hours. Thyroid function studies: No results for input(s): "TSH", "T4TOTAL", "T3FREE", "THYROIDAB" in the last 72 hours.  Invalid input(s): "FREET3" Anemia work up: No results for input(s): "VITAMINB12", "FOLATE", "FERRITIN", "TIBC", "IRON", "RETICCTPCT" in the last 72 hours. Sepsis Labs: Recent Labs  Lab 08/14/23 0808 08/19/23 0318 09/12/23 0442  WBC 9.5 12.8* 16.6*    Microbiology No results found for this or any previous visit (from the  past 240 hour(s)).  Procedures and diagnostic studies:  US RENAL  Result Date: 09/12/23 CLINICAL DATA:  Acute kidney insufficiency EXAM: RENAL / URINARY TRACT ULTRASOUND COMPLETE COMPARISON:  Ultrasound 02/06/2021.  CT scan 08/14/2023 FINDINGS: Right Kidney: Previous right nephrectomy. Left Kidney: Renal measurements: 11.4 x 6.1 x 5.0 cm = volume: 180.8 mL. Minimal fullness of the renal collecting system, unchanged from prior CT scan. No adjacent fluid. Bladder: Bladder is contracted. Other: None. IMPRESSION: Previous right nephrectomy. Slight fullness of the left renal collecting system, similar to the prior CT scan. Contracted urinary bladder. Electronically Signed   By: Karen Kays M.D.   On: Sep 12, 2023 12:07               LOS: 6 days   Inas [address]vena  [facility]   Pager on [email or web address]. If 7PM-7AM, please contact night-coverage at [email or web address]     09-12-23, 2:38 PM

## 2023-08-20 NOTE — Plan of Care (Signed)
Progressing toward all set goals

## 2023-08-20 NOTE — Progress Notes (Addendum)
PHARMACY - ANTICOAGULATION CONSULT NOTE  Pharmacy Consult for warfarin Indication: atrial fibrillation  No Known Allergies  Patient Measurements: Height: 4\' 10"  (147.3 cm) Weight: 78.9 kg (173 lb 15.1 oz) IBW/kg (Calculated) : 40.9  Vital Signs: Temp: 98.4 F (36.9 C) (10/28 0800) Temp Source: Oral (10/28 0800) BP: 127/72 (10/28 0800) Pulse Rate: 119 (10/28 0800)  Labs: Recent Labs    08/18/23 0640 08/19/23 0318 08/20/23 0442  HGB  --  10.0* 10.7*  HCT  --  30.0* 33.2*  PLT  --  235 275  LABPROT 27.9* 33.6* 38.5*  INR 2.6* 3.3* 3.9*  CREATININE  --   --  5.21*    Estimated Creatinine Clearance: 7.4 mL/min (A) (by C-G formula based on SCr of 5.21 mg/dL (H)).   Medical History: Past Medical History:  Diagnosis Date   Anemia    Atrial fibrillation (HCC)    Chronic kidney disease    Congestive heart failure (CHF) (HCC)    COPD (chronic obstructive pulmonary disease) (HCC)    Diabetes mellitus without complication (HCC)    GERD (gastroesophageal reflux disease)    Hyperlipidemia    Hypertension    Pneumonia    Pulmonary fibrosis (HCC)     Medications:  Warfarin 2 mg Tues, Thurs, Sat, Sun Warfarin 4 mg Mon, Wed, Fri Total weekly warfarin: 20 mg Average daily dose ~3 mg Last dose at home prior to admission 10/21 - 4mg   Assessment: 82 year old female presented with fall and right hip pain. PMH atrial fibrillation on warfarin.  No clinically significant drug interactions noted  Date INR Warfarin Dose  10/21 -- 4mg  (PTA)  10/22 1.7 3 mg  10/23 1.6 5 mg  10/24 2.0 2 mg   10/25 2.3 2 mg   10/26 2.6 2 mg  10/27 3.3 HOLD  10/28 3.9 HOLD     Goal of Therapy:  INR 2-3 Monitor platelets by anticoagulation protocol: Yes   Plan:  INR again increased from yesterday. Will hold warfarin dose again tonight. With two held doses, patient will have experienced about a 30% warfarin dose reduction this week. Daily INR. CBC at least every 3 days.   Will M.  Dareen Piano, PharmD Clinical Pharmacist 08/20/2023 9:31 AM

## 2023-08-20 NOTE — Progress Notes (Signed)
Occupational Therapy Treatment Patient Details Name: [name] MRN: [id number] DOB: [birth year] Today's Date: 08/20/2023   History of present illness Patient is a 82 year old female with mechanical fall with tip of the right greater trochanter avulsion fracture with hip abduction brace recommended. History of PAF, bradycardia, COPD, CKD, DM, HTN, HLD.   OT comments  Pt is supine in bed on arrival, son is leaving for MD appt. Pt easily arousable to verbal stimuli and agreeable to OT session. She denies pain at rest, but did grimace once or twice while getting in/out of bed. Pt requires total/Max A for all bed mobility. She tolerated 15 mins seated at EOB progressing from Mod A to CGA to maintain seated balance at EOB. Pt unable to scoot towards HOB without Max A from therapist. Returned to supine and NT called to assist in repositioning pt to [facility] requiring Max A x2. Pt returned to bed with all needs in place and will cont to require skilled acute OT services to maximize her safety and IND to return to PLOF. SNF remains appropriate recommendation on DC.       If plan is discharge home, recommend the following:  A lot of help with bathing/dressing/bathroom;A lot of help with walking and/or transfers;Two people to help with walking and/or transfers;Assist for transportation;Assistance with cooking/housework;Help with stairs or ramp for entrance;Direct supervision/assist for medications management;Supervision due to cognitive status;Direct supervision/assist for financial management   Equipment Recommendations  Other (comment) (defer)    Recommendations for Other Services      Precautions / Restrictions Precautions Precautions: Fall Precaution Comments: vision impaired Required Braces or Orthoses: Other Brace Other Brace: hip abduction brace Restrictions Weight Bearing Restrictions: Yes RLE Weight Bearing: Weight bearing as tolerated Other Position/Activity Restrictions: no active right  hip abduction       Mobility Bed Mobility Overal bed mobility: Needs Assistance Bed Mobility: Sit to Supine, Supine to Sit     Supine to sit: HOB elevated, Used rails, Max assist, Total assist Sit to supine: Total assist, HOB elevated, Used rails, Max assist   General bed mobility comments: pt more awake during OT session and agreeable to sit EOB x15 mins with initial Mod A and L lateral lean, then progressed to Min to CGA    Transfers                         Balance Overall balance assessment: Needs assistance Sitting-balance support: Bilateral upper extremity supported, Feet supported Sitting balance-Leahy Scale: Fair Sitting balance - Comments: CGA provided for safety, but initially needed Min to Mod A d/t L lateral lean       Standing balance comment: deferred                           ADL either performed or assessed with clinical judgement   ADL                                              Extremity/Trunk Assessment Upper Extremity Assessment Upper Extremity Assessment: Generalized weakness   Lower Extremity Assessment Lower Extremity Assessment: Generalized weakness        Vision       Perception     Praxis      Cognition Arousal: Alert, Lethargic Behavior During Therapy: Flat affect  Overall Cognitive Status: History of cognitive impairments - at baseline                                          Exercises      Shoulder Instructions       General Comments continued tremors while seated at EOB    Pertinent Vitals/ Pain       Pain Assessment Pain Assessment: Faces Faces Pain Scale: Hurts a little bit Pain Location: R hip Pain Descriptors / Indicators: Discomfort Pain Intervention(s): Limited activity within patient's tolerance, Monitored during session  Home Living                                          Prior Functioning/Environment               Frequency  Min 1X/week        Progress Toward Goals  OT Goals(current goals can now be found in the care plan section)  Progress towards OT goals: Progressing toward goals  Acute Rehab OT Goals Patient Stated Goal: improve pain and strength OT Goal Formulation: With patient/family Time For Goal Achievement: 08/30/23 Potential to Achieve Goals: Fair  Plan      Co-evaluation                 AM-PAC OT "6 Clicks" Daily Activity     Outcome Measure   Help from another person eating meals?: A Little Help from another person taking care of personal grooming?: A Little Help from another person toileting, which includes using toliet, bedpan, or urinal?: Total Help from another person bathing (including washing, rinsing, drying)?: A Lot Help from another person to put on and taking off regular upper body clothing?: A Little Help from another person to put on and taking off regular lower body clothing?: Total 6 Click Score: 13    End of Session Equipment Utilized During Treatment: Rolling walker (2 wheels)  OT Visit Diagnosis: Pain;History of falling (Z91.81);Muscle weakness (generalized) (M62.81);Other abnormalities of gait and mobility (R26.89) Pain - Right/Left: Right Pain - part of body: Hip   Activity Tolerance Patient tolerated treatment well   Patient Left in bed;with call bell/phone within reach;with bed alarm set   Nurse Communication Mobility status        Time: 7846-9629 OT Time Calculation (min): 34 min  Charges: OT General Charges $OT Visit: 1 Visit OT Treatments $Therapeutic Activity: 23-37 mins  Randy Whitener, OTR/L  08/20/23, 4:19 PM Jermel Artley E Devion Chriscoe 08/20/2023, 4:17 PM

## 2023-08-21 DIAGNOSIS — S72001A Fracture of unspecified part of neck of right femur, initial encounter for closed fracture: Secondary | ICD-10-CM | POA: Diagnosis not present

## 2023-08-21 DIAGNOSIS — N184 Chronic kidney disease, stage 4 (severe): Secondary | ICD-10-CM

## 2023-08-21 DIAGNOSIS — N178 Other acute kidney failure: Secondary | ICD-10-CM

## 2023-08-21 LAB — GLUCOSE, CAPILLARY
Glucose-Capillary: 113 mg/dL — ABNORMAL HIGH (ref 70–99)
Glucose-Capillary: 143 mg/dL — ABNORMAL HIGH (ref 70–99)
Glucose-Capillary: 133 mg/dL — ABNORMAL HIGH (ref 70–99)
Glucose-Capillary: 90 mg/dL (ref 70–99)

## 2023-08-21 LAB — PROTIME-INR
INR: 4.2 (ref 0.8–1.2)
Prothrombin Time: 40.9 s — ABNORMAL HIGH (ref 11.4–15.2)

## 2023-08-21 LAB — RENAL FUNCTION PANEL
Albumin: 2.2 g/dL — ABNORMAL LOW (ref 3.5–5.0)
Anion gap: 10 (ref 5–15)
BUN: 89 mg/dL — ABNORMAL HIGH (ref 8–23)
CO2: 21 mmol/L — ABNORMAL LOW (ref 22–32)
Calcium: 7.8 mg/dL — ABNORMAL LOW (ref 8.9–10.3)
Chloride: 101 mmol/L (ref 98–111)
Creatinine, Ser: 4.4 mg/dL — ABNORMAL HIGH (ref 0.44–1.00)
GFR, Estimated: 10 mL/min — ABNORMAL LOW (ref >60)
Glucose, Bld: 121 mg/dL — ABNORMAL HIGH (ref 70–99)
Phosphorus: 4.8 mg/dL — ABNORMAL HIGH (ref 2.5–4.6)
Potassium: 4 mmol/L (ref 3.5–5.1)
Sodium: 132 mmol/L — ABNORMAL LOW (ref 135–145)

## 2023-08-21 LAB — CBC WITH DIFFERENTIAL/PLATELET
Abs Immature Granulocytes: 0.07 10*3/uL (ref 0.00–0.07)
Basophils Absolute: 0 10*3/uL (ref 0.0–0.1)
Basophils Relative: 0 %
Eosinophils Absolute: 0 10*3/uL (ref 0.0–0.5)
Eosinophils Relative: 0 %
HCT: 26.6 % — ABNORMAL LOW (ref 36.0–46.0)
Hemoglobin: 8.9 g/dL — ABNORMAL LOW (ref 12.0–15.0)
Immature Granulocytes: 1 %
Lymphocytes Relative: 16 %
Lymphs Abs: 1.6 10*3/uL (ref 0.7–4.0)
MCH: 30.4 pg (ref 26.0–34.0)
MCHC: 33.5 g/dL (ref 30.0–36.0)
MCV: 90.8 fL (ref 80.0–100.0)
Monocytes Absolute: 0.7 10*3/uL (ref 0.1–1.0)
Monocytes Relative: 7 %
Neutro Abs: 7.7 10*3/uL (ref 1.7–7.7)
Neutrophils Relative %: 76 %
Platelets: 241 10*3/uL (ref 150–400)
RBC: 2.93 MIL/uL — ABNORMAL LOW (ref 3.87–5.11)
RDW: 14.3 % (ref 11.5–15.5)
WBC: 10 10*3/uL (ref 4.0–10.5)
nRBC: 0 % (ref 0.0–0.2)

## 2023-08-21 MED ORDER — LACTATED RINGERS IV SOLN
INTRAVENOUS | Status: DC
Start: 2023-08-21 — End: 2023-08-22

## 2023-08-21 MED ORDER — CHLORHEXIDINE GLUCONATE CLOTH 2 % EX PADS
6.0000 | MEDICATED_PAD | Freq: Every day | CUTANEOUS | Status: DC
  Administered 2023-08-21 – 2023-08-24 (×4): 6 via TOPICAL

## 2023-08-21 NOTE — Progress Notes (Signed)
OT Cancellation Note  Patient Details Name: [name] MRN: [id number] DOB: 1941-02-14   Cancelled Treatment:    Reason Eval/Treat Not Completed: Other (comment) (Pt in 10/10 pain with scheduled pain meds for 1530pm per discussion with nurse.) Will check back later as time allows and cont to follow POC.  Constance Goltz 08/21/2023, 3:25 PM

## 2023-08-21 NOTE — Progress Notes (Signed)
Physical Therapy Treatment Patient Details Name: [name] MRN: [id number] DOB: 11-13-1940 Today's Date: 08/21/2023   History of Present Illness Patient is a 82 year old female with mechanical fall with tip of the right greater trochanter avulsion fracture with hip abduction brace recommended. History of PAF, bradycardia, COPD, CKD, DM, HTN, HLD.    PT Comments  Patient is agreeable to PT session. She is more participatory with bed mobility efforts this session. Max A required for bed mobility. Sitting balance is fair but patient is fatigued quickly with activity. Continue to recommend rehabilitation <3 hours/day after this hospital stay.    If plan is discharge home, recommend the following: A lot of help with walking and/or transfers;A lot of help with bathing/dressing/bathroom;Assistance with cooking/housework;Help with stairs or ramp for entrance;Assist for transportation   Can travel by private vehicle     No  Equipment Recommendations  None recommended by PT    Recommendations for Other Services       Precautions / Restrictions Precautions Precautions: Fall Precaution Comments: vision impaired Required Braces or Orthoses: Other Brace Other Brace: hip abduction brace Restrictions Weight Bearing Restrictions: No RLE Weight Bearing: Weight bearing as tolerated Other Position/Activity Restrictions: no active right hip abduction     Mobility  Bed Mobility Overal bed mobility: Needs Assistance Bed Mobility: Supine to Sit, Sit to Supine     Supine to sit: Max assist, HOB elevated Sit to supine: Max assist, HOB elevated   General bed mobility comments: increased participation with bed mobility today. cues for technique and sequencing.    Transfers                   General transfer comment: not attempted as patient requesting to return to bed after sitting up for a few minutes    Ambulation/Gait                   Stairs              Wheelchair Mobility     Tilt Bed    Modified Rankin (Stroke Patients Only)       Balance Overall balance assessment: Needs assistance Sitting-balance support: Bilateral upper extremity supported, Feet supported Sitting balance-Leahy Scale: Fair                                      Cognition Arousal: Alert Behavior During Therapy: Flat affect Overall Cognitive Status: History of cognitive impairments - at baseline                                 General Comments: patient is able to follow single step commands with increased time        Exercises General Exercises - Lower Extremity Ankle Circles/Pumps: AROM, Strengthening, Both, 10 reps, Seated Long Arc Quad: AROM, AAROM, Strengthening, Both, 5 reps, Seated Other Exercises Other Exercises: cues for exercise technique for strengthening    General Comments        Pertinent Vitals/Pain Pain Assessment Pain Assessment: Faces Faces Pain Scale: Hurts a little bit Pain Location: R hip Pain Descriptors / Indicators: Discomfort Pain Intervention(s): Limited activity within patient's tolerance, Monitored during session    Home Living  Prior Function            PT Goals (current goals can now be found in the care plan section) Acute Rehab PT Goals Patient Stated Goal: none stated PT Goal Formulation: With family Time For Goal Achievement: 08/29/23 Potential to Achieve Goals: Fair Progress towards PT goals: Progressing toward goals    Frequency    Min 1X/week      PT Plan      Co-evaluation              AM-PAC PT "6 Clicks" Mobility   Outcome Measure  Help needed turning from your back to your side while in a flat bed without using bedrails?: A Lot Help needed moving from lying on your back to sitting on the side of a flat bed without using bedrails?: A Lot Help needed moving to and from a bed to a chair (including a  wheelchair)?: Total Help needed standing up from a chair using your arms (e.g., wheelchair or bedside chair)?: Total Help needed to walk in hospital room?: Total Help needed climbing 3-5 steps with a railing? : Total 6 Click Score: 8    End of Session Equipment Utilized During Treatment:  (hip abduction brace) Activity Tolerance: Patient limited by lethargy Patient left: in bed;with call bell/phone within reach;with bed alarm set;with family/visitor present Nurse Communication: Mobility status (family requesting to have diet upgraded) PT Visit Diagnosis: Unsteadiness on feet (R26.81);Muscle weakness (generalized) (M62.81)     Time: 1140-1155 PT Time Calculation (min) (ACUTE ONLY): 15 min  Charges:    $Therapeutic Activity: 8-22 mins PT General Charges $$ ACUTE PT VISIT: 1 Visit                     Donna Bernard, PT, MPT    Ina Homes 08/21/2023, 12:23 PM

## 2023-08-21 NOTE — TOC Initial Note (Signed)
Transition of Care [facility]) - Initial/Assessment Note    Patient Details  Name: [name] MRN: [id number] Date of Birth: 1941/02/05  Transition of Care [facility]) CM/SW Contact:    Marlowe Sax, RN Phone Number: 08/21/2023, 12:27 PM  Clinical Narrative:                 Sent bedsearch for possible STR bed offer, Ins approval will be needed once bed choice made, will review once offers received  Expected Discharge Plan: Skilled Nursing Facility Barriers to Discharge: SNF Pending bed offer, Insurance Authorization   Patient Goals and CMS Choice            Expected Discharge Plan and Services   Discharge Planning Services: CM Consult   Living arrangements for the past 2 months: Single Family Home                                      Prior Living Arrangements/Services Living arrangements for the past 2 months: Single Family Home Lives with:: Adult Children              Current home services: DME    Activities of Daily Living   ADL Screening (condition at time of admission) Independently performs ADLs?: Yes (appropriate for developmental age) Does the patient have a NEW difficulty with bathing/dressing/toileting/self-feeding that is expected to last >3 days?: No Does the patient have a NEW difficulty with getting in/out of bed, walking, or climbing stairs that is expected to last >3 days?: No Does the patient have a NEW difficulty with communication that is expected to last >3 days?: No Is the patient deaf or have difficulty hearing?: Yes Does the patient have difficulty seeing, even when wearing glasses/contacts?: Yes Does the patient have difficulty concentrating, remembering, or making decisions?: No  Permission Sought/Granted                  Emotional Assessment              Admission diagnosis:  Hip fracture (HCC) [S72.009A] Closed nondisplaced fracture of greater trochanter of right femur, initial encounter (HCC) [S72.114A] Atrial  fibrillation, unspecified type [facility]) [I48.91] Patient Active Problem List   Diagnosis Date Noted   Closed nondisplaced fracture of greater trochanter of right femur (HCC) 08/14/2023   Hip fracture (HCC) 08/14/2023   Pneumonia 07/22/2023   Left leg pain 07/22/2023   COPD exacerbation (HCC) 05/22/2023   Pressure injury of skin 05/22/2023   COPD with acute exacerbation (HCC) 05/21/2023   Pulmonary fibrosis (HCC) 05/21/2023   Dementia without behavioral disturbance (HCC) 05/21/2023   Abdominal pain, chronic, right upper quadrant 11/17/2022   Radial styloid tenosynovitis of left hand 07/26/2022   Pain in thumb joint with movement of left hand 05/29/2022   Acute pain of left wrist 05/29/2022   Closed fracture of left upper limb    Fall 11/30/2021   Traumatic closed fracture of surgical neck of left humerus with minimal displacement 11/30/2021   Ingrown nail of great toe of left foot 08/18/2021   Acute on chronic diastolic congestive heart failure (HCC) 05/24/2021   Type II diabetes mellitus with renal manifestations (HCC) 05/24/2021   HLD (hyperlipidemia) 05/24/2021   Chest pain 05/24/2021   Depression 05/24/2021   Legally blind 05/24/2021   Abdominal pain 05/24/2021   Chronic respiratory failure with hypoxia (HCC) 03/16/2021   SOB (shortness of breath) 03/15/2021  Benign hypertensive kidney disease with chronic kidney disease 03/14/2021   Chronic kidney disease, stage IV (severe) (HCC) 03/14/2021   Acute renal failure superimposed on stage 4 chronic kidney disease (HCC) 02/08/2021   Anemia associated with stage 4 chronic renal failure (HCC) 02/08/2021   Anemia due to stage 5 chronic kidney disease (HCC) 02/07/2021   Acute CHF (congestive heart failure) (HCC) 02/06/2021   Acute on chronic diastolic CHF (congestive heart failure) (HCC) 02/06/2021   Hyperkalemia 02/06/2021   Weakness 12/24/2020   Hypertensive urgency 12/23/2020   Nicotine dependence 12/23/2020   History of anemia due  to chronic kidney disease 12/23/2020   Pain due to onychomycosis of toenails of both feet 02/12/2020   Uncontrolled type 2 diabetes mellitus with hyperglycemia (HCC) 05/20/2019   Acute recurrent pansinusitis 05/11/2019   Acute otitis media 05/11/2019   Acute pain of left shoulder 05/11/2019   Long term (current) use of anticoagulants 04/03/2019   Chronic left shoulder pain 03/18/2019   Sepsis (HCC) 01/12/2018   Colitis 01/12/2018   CKD (chronic kidney disease), stage III (HCC) 01/12/2018   Diabetes (HCC) 12/28/2017   Paroxysmal A-fib (HCC) 12/28/2017   Encounter for general adult medical examination with abnormal findings 12/28/2017   Primary generalized (osteo)arthritis 12/28/2017   Hypertension 12/27/2017   GERD (gastroesophageal reflux disease) 12/27/2017   Low back pain 02/20/2017   Degeneration of lumbar intervertebral disc 02/20/2017   Hematuria 03/18/2015   Chronic combined systolic and diastolic heart failure (HCC) 03/01/2012   Obesity 03/01/2012   CKD stage 4 due to type 2 diabetes mellitus (HCC) 03/01/2012   Other benign neoplasm of connective and other soft tissue of lower limb, including hip 03/15/2011   Neoplasm of connective tissue 02/01/2011   Pain in joint, pelvic region and thigh 02/01/2011   Type 2 diabetes mellitus with stage 5 chronic kidney disease (HCC) 10/03/2006   Encounter for current long-term use of anticoagulants 02/07/2006   Essential hypertension 09/24/2004   Atrial fibrillation (HCC) 06/07/2004   PCP:  Sallyanne Kuster, NP Pharmacy:   [facility] [address] ([address]), [place] - [address] [place] ([place]) [place] [address] Phone: [phone] Fax: [phone]  [id number] [place], [place] - [address] AT [facility] [address] [place] [address] Phone: [phone] Fax: [phone]     Social Determinants of Health (SDOH) Social History: SDOH  Screenings   Food Insecurity: No Food Insecurity (08/14/2023)  Housing: Low Risk  (08/14/2023)  Transportation Needs: No Transportation Needs (08/14/2023)  Utilities: Not At Risk (08/14/2023)  Alcohol Screen: Low Risk  (03/14/2022)  Depression (PHQ2-9): Low Risk  (08/06/2023)  Tobacco Use: Medium Risk (08/14/2023)   SDOH Interventions:     Readmission Risk Interventions    06/29/2021    8:45 AM 05/27/2021    2:34 PM  Readmission Risk Prevention Plan  Transportation Screening Complete Complete  Medication Review (RN Care Manager) Complete Complete  PCP or Specialist appointment within 3-5 days of discharge Complete Complete  HRI or Home Care Consult Complete Complete  SW Recovery Care/Counseling Consult Complete Complete  Palliative Care Screening Not Applicable Complete  Skilled Nursing Facility Not Applicable Complete

## 2023-08-21 NOTE — Progress Notes (Incomplete)
Progress Note    [name]  QMV:[id number] DOB: Aug 17, 1941  DOA: 08/14/2023 PCP: Sallyanne Kuster, NP      Brief Narrative:    Medical records reviewed and are as summarized below:  [name] is a 82 y.o. female with medical history significant of PAF on warfarin, off rate control medications due to persistent bradycardia, COPD, pulmonary fibrosis, dementia, CKD stage IV, IDDM, HTN, HLD, legal blindness, brought in by family member after a mechanical fall.  Reportedly, patient fell on her right side and hit her right hip as she was trying to sit down on her bed when she missed the edge of the bed.       Assessment/Plan:   Principal Problem:   Hip fracture (HCC) Active Problems:   Atrial fibrillation (HCC)   Paroxysmal A-fib (HCC)   Acute CHF (congestive heart failure) (HCC)   Acute renal failure superimposed on stage 4 chronic kidney disease (HCC)   Closed nondisplaced fracture of greater trochanter of right femur (HCC)    Body mass index is 36.35 kg/m.  (Obesity)   Acute right greater trochanter nondisplaced fracture: Patient was evaluated by orthopedic surgeon, Dr. Audelia Acton.  He recommended conservative management.  Analgesics as needed for pain.  PT recommended discharge to SNF.   Acute on chronic HFpEF: Previously treated with IV Lasix.   Torsemide has been discontinued because of AKI. 2D echo on 08/15/2023 showed EF estimated at 45 to 50% (midrange EF), mild LVH, indeterminate LV diastolic parameters, mildly elevated pulmonary arterial systolic pressure, mild MR, mild to moderate TR, mild aortic stenosis.   Atrial fibrillation with RVR: INR is supratherapeutic at 4.2.  Hold warfarin.  Pharmacist to adjust warfarin dose accordingly per protocol.  She is not on long-term rate control drugs because of history of bradycardia. [name] (daughter) said that they were told patient is at high risk for surgery/anesthesia so they decided not to pursue evaluation  for pacemaker placement.   Type II DM with hyperglycemia: Hold insulin glargine because of AKI to minimize risk of hypoglycemia.  NovoLog as needed for hyperglycemia   AKI on CKD stage IV: Creatinine is proving (down from 5.2-4.4 (.  Urine output is adequate.  Of note, patient was hypotensive with systolic BP in the 80s on 08/19/2023. Continue IV Ringer's lactate infusion but decrease rate from 125 mL/h to 75 mL/h. Renal ultrasound ordered today shows previous right nephrectomy, slight fullness of left renal collecting system and contracted urinary bladder. Hyponatremia: Slowly improving.  Continue IV fluids   COPD, pulmonary fibrosis: Compensated.  She is tolerating room air.  Bronchodilators as needed.   Hypotension: BP has improved.   Leukocytosis: Resolved.  No evidence of infection thus far.   Acute urinary retention: Indwelling Foley catheter was placed on 08/19/2023 after multiple in and out urethral catheterizations.  Monitor urine output given AKI.   Comorbidities include legal blindness, dementia: Continue supportive care    Plan discussed with [name], son, at the bedside.    Diet Order             DIET - DYS 1 Room service appropriate? Yes; Fluid consistency: Thin  Diet effective now                            Consultants: Orthopedic surgeon  Procedures: None    Medications:    atorvastatin  10 mg Oral QPM   Chlorhexidine Gluconate Cloth  6 each Topical Daily   citalopram  20 mg Oral Daily   donepezil  5 mg Oral q morning   ferrous sulfate  325 mg Oral Q breakfast   influenza vaccine adjuvanted  0.5 mL Intramuscular Tomorrow-1000   insulin aspart  0-15 Units Subcutaneous TID WC   lidocaine  1 patch Transdermal Q24H   linaclotide  145 mcg Oral QHS   montelukast  10 mg Oral QHS   pantoprazole  40 mg Oral BID   rOPINIRole  0.5 mg Oral QPM   Warfarin - Pharmacist Dosing Inpatient   Does not apply q1600   Continuous  Infusions:  lactated ringers 75 mL/hr at 08/21/23 1121     Anti-infectives (From admission, onward)    None              Family Communication/Anticipated D/C date and plan/Code Status   DVT prophylaxis:      Code Status: Limited: Do not attempt resuscitation (DNR) -DNR-LIMITED -Do Not Intubate/DNI   Family Communication: Plan discussed with [name], son, at the bedside Disposition Plan: Plan to discharge to SNF   Status is: Inpatient Remains inpatient appropriate because: Awaiting placement to SNF       Subjective:   Interval events noted.  She has no complaints.  [name], son, was at the bedside.  He said that patient looks more alert and communicative today compared to yesterday.  Objective:    Vitals:   08/20/23 1614 08/20/23 2323 08/21/23 0732 08/21/23 1127  BP: (!) 100/53 (!) 117/59 (!) 104/53 99/70  Pulse: (!) 106 (!) 101 (!) 108 93  Resp: 20 18 18 18   Temp:  98.6 F (37 C) 98.1 F (36.7 C) 98.7 F (37.1 C)  TempSrc:  Oral    SpO2: 96% 96% 98% 97%  Weight:      Height:       No data found.   Intake/Output Summary (Last 24 hours) at 08/21/2023 1339 Last data filed at 08/21/2023 1024 Gross per 24 hour  Intake 120 ml  Output 1200 ml  Net -1080 ml   Filed Weights   08/18/23 0500 08/19/23 0425 08/20/23 0500  Weight: 74.1 kg 74.6 kg 78.9 kg    Exam:    GEN: NAD SKIN: Warm and dry EYES: No pallor or icterus ENT: MMM CV: RRR PULM: CTA B ABD: soft, ND, NT, +BS CNS: AAO x 3, non focal EXT: Mild right hip tenderness.  Brace on right hip in place GU: Foley catheter draining amber urine    Data Reviewed:   I have personally reviewed following labs and imaging studies:  Labs: Labs show the following:   Basic Metabolic Panel: Recent Labs  Lab 08/15/23 0500 08/20/23 0442 08/21/23 0759  NA 136 131* 132*  K 4.5 4.2 4.0  CL 99 97* 101  CO2 25 20* 21*  GLUCOSE 162* 133* 121*  BUN 39* 95* 89*  CREATININE 3.03* 5.21*  4.40*  CALCIUM 8.7* 8.2* 7.8*  PHOS  --   --  4.8*   GFR Estimated Creatinine Clearance: 8.7 mL/min (A) (by C-G formula based on SCr of 4.4 mg/dL (H)). Liver Function Tests: Recent Labs  Lab 08/20/23 0442 08/21/23 0759  AST 42*  --   ALT 13  --   ALKPHOS 80  --   BILITOT 1.1  --   PROT 7.2  --   ALBUMIN 2.8* 2.2*   No results for input(s): "LIPASE", "AMYLASE" in the last 168 hours. No results for input(s): "  AMMONIA" in the last 168 hours. Coagulation profile Recent Labs  Lab 08/17/23 0431 08/18/23 0640 08/19/23 0318 08/20/23 0442 08/21/23 0453  INR 2.3* 2.6* 3.3* 3.9* 4.2*    CBC: Recent Labs  Lab 08/19/23 0318 08/20/23 0442 08/21/23 0453  WBC 12.8* 16.6* 10.0  NEUTROABS  --  14.9* 7.7  HGB 10.0* 10.7* 8.9*  HCT 30.0* 33.2* 26.6*  MCV 90.9 92.7 90.8  PLT 235 275 241   Cardiac Enzymes: No results for input(s): "CKTOTAL", "CKMB", "CKMBINDEX", "TROPONINI" in the last 168 hours. BNP (last 3 results) No results for input(s): "PROBNP" in the last 8760 hours. CBG: Recent Labs  Lab 08/20/23 1143 08/20/23 1637 08/20/23 2059 08/21/23 0733 08/21/23 1127  GLUCAP 164* 121* 113* 113* 143*   D-Dimer: No results for input(s): "DDIMER" in the last 72 hours. Hgb A1c: No results for input(s): "HGBA1C" in the last 72 hours. Lipid Profile: No results for input(s): "CHOL", "HDL", "LDLCALC", "TRIG", "CHOLHDL", "LDLDIRECT" in the last 72 hours. Thyroid function studies: No results for input(s): "TSH", "T4TOTAL", "T3FREE", "THYROIDAB" in the last 72 hours.  Invalid input(s): "FREET3" Anemia work up: No results for input(s): "VITAMINB12", "FOLATE", "FERRITIN", "TIBC", "IRON", "RETICCTPCT" in the last 72 hours. Sepsis Labs: Recent Labs  Lab 08/19/23 0318 08/20/23 0442 08/21/23 0453  WBC 12.8* 16.6* 10.0    Microbiology No results found for this or any previous visit (from the past 240 hour(s)).  Procedures and diagnostic studies:  US RENAL  Result Date:  08/20/2023 CLINICAL DATA:  Acute kidney insufficiency EXAM: RENAL / URINARY TRACT ULTRASOUND COMPLETE COMPARISON:  Ultrasound 02/06/2021.  CT scan 08/14/2023 FINDINGS: Right Kidney: Previous right nephrectomy. Left Kidney: Renal measurements: 11.4 x 6.1 x 5.0 cm = volume: 180.8 mL. Minimal fullness of the renal collecting system, unchanged from prior CT scan. No adjacent fluid. Bladder: Bladder is contracted. Other: None. IMPRESSION: Previous right nephrectomy. Slight fullness of the left renal collecting system, similar to the prior CT scan. Contracted urinary bladder. Electronically Signed   By: Karen Kays M.D.   On: 08/20/2023 12:07               LOS: 7 days   [name]  [facility]   Pager on [email or web address]. If 7PM-7AM, please contact night-coverage at [email or web address]     08/21/2023, 1:39 PM

## 2023-08-21 NOTE — TOC Progression Note (Signed)
Transition of Care [facility]) - Progression Note    Patient Details  Name: [name] MRN: [id number] Date of Birth: 08-05-1941  Transition of [facility]) CM/SW Contact  Marlowe Sax, RN Phone Number: 08/21/2023, 3:29 PM  Clinical Narrative:    Spoke with June the patient's daughter and reviewed bed offers, she chose [facility] and said she will arrange for her brother to be available if she discharges tomorrow  I explained that Non urgent ambulance will transport once Ins approved and she is discharged Ins pending  Expected Discharge Plan: Skilled Nursing Facility Barriers to Discharge: SNF Pending bed offer, Insurance Authorization  Expected Discharge Plan and Services   Discharge Planning Services: CM Consult   Living arrangements for the past 2 months: Single Family Home                                       Social Determinants of Health (SDOH) Interventions SDOH Screenings   Food Insecurity: No Food Insecurity (08/14/2023)  Housing: Low Risk  (08/14/2023)  Transportation Needs: No Transportation Needs (08/14/2023)  Utilities: Not At Risk (08/14/2023)  Alcohol Screen: Low Risk  (03/14/2022)  Depression (PHQ2-9): Low Risk  (08/06/2023)  Tobacco Use: Medium Risk (08/14/2023)    Readmission Risk Interventions    06/29/2021    8:45 AM 05/27/2021    2:34 PM  Readmission Risk Prevention Plan  Transportation Screening Complete Complete  Medication Review (RN Care Manager) Complete Complete  PCP or Specialist appointment within 3-5 days of discharge Complete Complete  HRI or Home Care Consult Complete Complete  SW Recovery Care/Counseling Consult Complete Complete  Palliative Care Screening Not Applicable Complete  Skilled Nursing Facility Not Applicable Complete

## 2023-08-21 NOTE — [place] FL2 (Signed)
[place] MEDICAID FL2 LEVEL OF CARE FORM     IDENTIFICATION  Patient Name: [name] Birthdate: 1941/06/19 Sex: female Admission Date (Current Location): 08/14/2023  [facility] and [place] Number:  Chiropodist and Address:  [facility], [address], [place], [place] [address]      Provider Number: [id number]  Attending Physician Name and Address:  Lurene Shadow, MD  Relative Name and Phone Number:  June daughter [phone]    Current Level of Care: Hospital Recommended Level of Care: Skilled Nursing Facility Prior Approval Number:    Date Approved/Denied:   PASRR Number: [id number]  Discharge Plan: SNF    Current Diagnoses: Patient Active Problem List   Diagnosis Date Noted   Closed nondisplaced fracture of greater trochanter of right femur (HCC) 08/14/2023   Hip fracture (HCC) 08/14/2023   Pneumonia 07/22/2023   Left leg pain 07/22/2023   COPD exacerbation (HCC) 05/22/2023   Pressure injury of skin 05/22/2023   COPD with acute exacerbation (HCC) 05/21/2023   Pulmonary fibrosis (HCC) 05/21/2023   Dementia without behavioral disturbance (HCC) 05/21/2023   Abdominal pain, chronic, right upper quadrant 11/17/2022   Radial styloid tenosynovitis of left hand 07/26/2022   Pain in thumb joint with movement of left hand 05/29/2022   Acute pain of left wrist 05/29/2022   Closed fracture of left upper limb    Fall 11/30/2021   Traumatic closed fracture of surgical neck of left humerus with minimal displacement 11/30/2021   Ingrown nail of great toe of left foot 08/18/2021   Acute on chronic diastolic congestive heart failure (HCC) 05/24/2021   Type II diabetes mellitus with renal manifestations (HCC) 05/24/2021   HLD (hyperlipidemia) 05/24/2021   Chest pain 05/24/2021   Depression 05/24/2021   Legally blind 05/24/2021   Abdominal pain 05/24/2021   Chronic respiratory failure with hypoxia (HCC) 03/16/2021   SOB (shortness of  breath) 03/15/2021   Benign hypertensive kidney disease with chronic kidney disease 03/14/2021   Chronic kidney disease, stage IV (severe) (HCC) 03/14/2021   Acute renal failure superimposed on stage 4 chronic kidney disease (HCC) 02/08/2021   Anemia associated with stage 4 chronic renal failure (HCC) 02/08/2021   Anemia due to stage 5 chronic kidney disease (HCC) 02/07/2021   Acute CHF (congestive heart failure) (HCC) 02/06/2021   Acute on chronic diastolic CHF (congestive heart failure) (HCC) 02/06/2021   Hyperkalemia 02/06/2021   Weakness 12/24/2020   Hypertensive urgency 12/23/2020   Nicotine dependence 12/23/2020   History of anemia due to chronic kidney disease 12/23/2020   Pain due to onychomycosis of toenails of both feet 02/12/2020   Uncontrolled type 2 diabetes mellitus with hyperglycemia (HCC) 05/20/2019   Acute recurrent pansinusitis 05/11/2019   Acute otitis media 05/11/2019   Acute pain of left shoulder 05/11/2019   Long term (current) use of anticoagulants 04/03/2019   Chronic left shoulder pain 03/18/2019   Sepsis (HCC) 01/12/2018   Colitis 01/12/2018   CKD (chronic kidney disease), stage III (HCC) 01/12/2018   Diabetes (HCC) 12/28/2017   Paroxysmal A-fib (HCC) 12/28/2017   Encounter for general adult medical examination with abnormal findings 12/28/2017   Primary generalized (osteo)arthritis 12/28/2017   Hypertension 12/27/2017   GERD (gastroesophageal reflux disease) 12/27/2017   Low back pain 02/20/2017   Degeneration of lumbar intervertebral disc 02/20/2017   Hematuria 03/18/2015   Chronic combined systolic and diastolic heart failure (HCC) 03/01/2012   Obesity 03/01/2012   CKD stage 4 due to type 2 diabetes  mellitus (HCC) 03/01/2012   Other benign neoplasm of connective and other soft tissue of lower limb, including hip 03/15/2011   Neoplasm of connective tissue 02/01/2011   Pain in joint, pelvic region and thigh 02/01/2011   Type 2 diabetes mellitus with  stage 5 chronic kidney disease (HCC) 10/03/2006   Encounter for current long-term use of anticoagulants 02/07/2006   Essential hypertension 09/24/2004   Atrial fibrillation (HCC) 06/07/2004    Orientation RESPIRATION BLADDER Height & Weight     Self, Situation, Time, Place  Normal Continent Weight: 78.9 kg Height:  4\' 10"  (147.3 cm)  BEHAVIORAL SYMPTOMS/MOOD NEUROLOGICAL BOWEL NUTRITION STATUS      Incontinent Diet (see dc summary)  AMBULATORY STATUS COMMUNICATION OF NEEDS Skin   Extensive Assist Verbally Normal                       Personal Care Assistance Level of Assistance  Bathing, Feeding, Dressing Bathing Assistance: Limited assistance Feeding assistance: Independent Dressing Assistance: Limited assistance     Functional Limitations Info  Sight, Hearing, Speech Sight Info: Impaired (blind) Hearing Info: Impaired Speech Info: Adequate    SPECIAL CARE FACTORS FREQUENCY  PT (By licensed PT), OT (By licensed OT)     PT Frequency: 5 times per week OT Frequency: 5 times per week            Contractures Contractures Info: Not present    Additional Factors Info  Code Status, Allergies Code Status Info: DNR Allergies Info: NKDA           Current Medications (08/21/2023):  This is the current hospital active medication list Current Facility-Administered Medications  Medication Dose Route Frequency Provider Last Rate Last Admin   acetaminophen (TYLENOL) tablet 650 mg  650 mg Oral Q4H PRN Mikey College T, MD   650 mg at 08/19/23 1643   albuterol (PROVENTIL) (2.5 MG/3ML) 0.083% nebulizer solution 3 mL  3 mL Inhalation Q6H PRN Mikey College T, MD       atorvastatin (LIPITOR) tablet 10 mg  10 mg Oral QPM Mikey College T, MD   10 mg at 08/20/23 1649   Chlorhexidine Gluconate Cloth 2 % PADS 6 each  6 each Topical Daily Lurene Shadow, MD   6 each at 08/21/23 1125   citalopram (CELEXA) tablet 20 mg  20 mg Oral Daily Mikey College T, MD   20 mg at 08/21/23 0924    donepezil (ARICEPT) tablet 5 mg  5 mg Oral q morning Mikey College T, MD   5 mg at 08/21/23 1610   ferrous sulfate tablet 325 mg  325 mg Oral Q breakfast Mikey College T, MD   325 mg at 08/21/23 0924   guaiFENesin (ROBITUSSIN) 100 MG/5ML liquid 5 mL  5 mL Oral Q4H PRN Mikey College T, MD       influenza vaccine adjuvanted (FLUAD) injection 0.5 mL  0.5 mL Intramuscular Tomorrow-1000 Mikey College T, MD       insulin aspart (novoLOG) injection 0-15 Units  0-15 Units Subcutaneous TID WC Emeline General, MD   2 Units at 08/20/23 1649   lactated ringers infusion   Intravenous Continuous Lurene Shadow, MD 75 mL/hr at 08/21/23 1121 New Bag at 08/21/23 1121   lidocaine (LIDODERM) 5 % 1 patch  1 patch Transdermal Q24H Concha Se, MD   1 patch at 08/21/23 9604   linaclotide (LINZESS) capsule 145 mcg  145 mcg Oral QHS Emeline General, MD   [phone]  mcg at 08/20/23 2058   metoprolol tartrate (LOPRESSOR) injection 2.5 mg  2.5 mg Intravenous Q4H PRN Mikey College T, MD   2.5 mg at 08/19/23 0831   montelukast (SINGULAIR) tablet 10 mg  10 mg Oral QHS Mikey College T, MD   10 mg at 08/20/23 2059   ondansetron (ZOFRAN) injection 4 mg  4 mg Intravenous Q6H PRN Mikey College T, MD   4 mg at 08/14/23 2227   ondansetron (ZOFRAN-ODT) disintegrating tablet 4 mg  4 mg Oral Q6H PRN Emeline General, MD       oxyCODONE-acetaminophen (PERCOCET/ROXICET) 5-325 MG per tablet 1 tablet  1 tablet Oral Q6H PRN Enedina Finner, MD   1 tablet at 08/21/23 0924   pantoprazole (PROTONIX) EC tablet 40 mg  40 mg Oral BID Mikey College T, MD   40 mg at 08/21/23 0924   polyethylene glycol (MIRALAX / GLYCOLAX) packet 17 g  17 g Oral Daily PRN Mikey College T, MD   17 g at 08/16/23 0500   rOPINIRole (REQUIP) tablet 0.5 mg  0.5 mg Oral QPM Mikey College T, MD   0.5 mg at 08/20/23 1650   Warfarin - Pharmacist Dosing Inpatient   Does not apply q1600 Jaynie Bream [facility]   Given at 08/18/23 1620     Discharge Medications: Please see discharge summary for a list of  discharge medications.  Relevant Imaging Results:  Relevant Lab Results:   Additional Information SS# [id number]  Marlowe Sax, RN

## 2023-08-21 NOTE — Progress Notes (Addendum)
Progress Note    LAREN LAWRIE  XBM:[id number] DOB: 03/13/1941  DOA: 08/14/2023 PCP: Sallyanne Kuster, NP      Brief Narrative:    Medical records reviewed and are as summarized below:  ZUZU EGLE is a 82 y.o. female with medical history significant of PAF on warfarin, off rate control medications due to persistent bradycardia, COPD, pulmonary fibrosis, dementia, CKD stage IV, IDDM, HTN, HLD, legal blindness, brought in by family member after a mechanical fall.  Reportedly, patient fell on her right side and hit her right hip as she was trying to sit down on her bed when she missed the edge of the bed.       Assessment/Plan:   Principal Problem:   Hip fracture (HCC) Active Problems:   Atrial fibrillation (HCC)   Paroxysmal A-fib (HCC)   Acute CHF (congestive heart failure) (HCC)   Acute renal failure superimposed on stage 4 chronic kidney disease (HCC)   Closed nondisplaced fracture of greater trochanter of right femur (HCC)    Body mass index is 36.35 kg/m.  (Obesity)   Acute right greater trochanter nondisplaced fracture: Patient was evaluated by orthopedic surgeon, Dr. Audelia Acton.  He recommended conservative management.  Analgesics as needed for pain.  PT recommended discharge to SNF.   Acute on chronic HFpEF: Previously treated with IV Lasix.   Torsemide has been discontinued because of AKI. 2D echo on 08/15/2023 showed EF estimated at 45 to 50% (midrange EF), mild LVH, indeterminate LV diastolic parameters, mildly elevated pulmonary arterial systolic pressure, mild MR, mild to moderate TR, mild aortic stenosis.   Atrial fibrillation with RVR: INR is supratherapeutic at 4.2.  Hold warfarin.  Pharmacist to adjust warfarin dose accordingly per protocol.  She is not on long-term rate control drugs because of history of bradycardia. Lupita Leash (daughter) said that they were told patient is at high risk for surgery/anesthesia so they decided not to pursue evaluation  for pacemaker placement.   Type II DM with hyperglycemia: Hold insulin glargine because of AKI to minimize risk of hypoglycemia.  NovoLog as needed for hyperglycemia   AKI on CKD stage IV: Creatinine is proving (down from 5.2-4.4 (.  Urine output is adequate.  Of note, patient was hypotensive with systolic BP in the 80s on 08/19/2023. Continue IV Ringer's lactate infusion but decrease rate from 125 mL/h to 75 mL/h. Renal ultrasound ordered today shows previous right nephrectomy, slight fullness of left renal collecting system and contracted urinary bladder. Hyponatremia: Slowly improving.  Continue IV fluids   COPD, pulmonary fibrosis: Compensated.  She is tolerating room air.  Bronchodilators as needed.   Hypotension: BP has improved.   Leukocytosis: Resolved.  No evidence of infection thus far.   Acute urinary retention: Indwelling Foley catheter was placed on 08/19/2023 after multiple in and out urethral catheterizations.  Monitor urine output given AKI.   Comorbidities include legal blindness, dementia: Continue supportive care    Plan discussed with Mr. Read Drivers, son, at the bedside.    Diet Order             DIET - DYS 1 Room service appropriate? Yes; Fluid consistency: Thin  Diet effective now                            Consultants: Orthopedic surgeon  Procedures: None    Medications:    atorvastatin  10 mg Oral QPM   Chlorhexidine Gluconate Cloth  6 each Topical Daily   citalopram  20 mg Oral Daily   donepezil  5 mg Oral q morning   ferrous sulfate  325 mg Oral Q breakfast   influenza vaccine adjuvanted  0.5 mL Intramuscular Tomorrow-1000   insulin aspart  0-15 Units Subcutaneous TID WC   lidocaine  1 patch Transdermal Q24H   linaclotide  145 mcg Oral QHS   montelukast  10 mg Oral QHS   pantoprazole  40 mg Oral BID   rOPINIRole  0.5 mg Oral QPM   Warfarin - Pharmacist Dosing Inpatient   Does not apply q1600   Continuous  Infusions:  lactated ringers 75 mL/hr at 08/21/23 1121     Anti-infectives (From admission, onward)    None              Family Communication/Anticipated D/C date and plan/Code Status   DVT prophylaxis:      Code Status: Limited: Do not attempt resuscitation (DNR) -DNR-LIMITED -Do Not Intubate/DNI   Family Communication: Plan discussed with Mr. Read Drivers, son, at the bedside Disposition Plan: Plan to discharge to SNF   Status is: Inpatient Remains inpatient appropriate because: Awaiting placement to SNF       Subjective:   Interval events noted.  She has no complaints.  Gerri Spore, son, was at the bedside.  He said that patient looks more alert and communicative today compared to yesterday.  Objective:    Vitals:   08/20/23 1614 08/20/23 2323 08/21/23 0732 08/21/23 1127  BP: (!) 100/53 (!) 117/59 (!) 104/53 99/70  Pulse: (!) 106 (!) 101 (!) 108 93  Resp: 20 18 18 18   Temp:  98.6 F (37 C) 98.1 F (36.7 C) 98.7 F (37.1 C)  TempSrc:  Oral    SpO2: 96% 96% 98% 97%  Weight:      Height:       No data found.   Intake/Output Summary (Last 24 hours) at 08/21/2023 1332 Last data filed at 08/21/2023 1024 Gross per 24 hour  Intake 120 ml  Output 1200 ml  Net -1080 ml   Filed Weights   08/18/23 0500 08/19/23 0425 08/20/23 0500  Weight: 74.1 kg 74.6 kg 78.9 kg    Exam:    GEN: NAD SKIN: Warm and dry EYES: No pallor or icterus ENT: MMM CV: RRR PULM: CTA B ABD: soft, ND, NT, +BS CNS: AAO x 3, non focal EXT: Mild right hip tenderness.  Brace on right hip in place GU: Foley catheter draining amber urine    Data Reviewed:   I have personally reviewed following labs and imaging studies:  Labs: Labs show the following:   Basic Metabolic Panel: Recent Labs  Lab 08/15/23 0500 08/20/23 0442 08/21/23 0759  NA 136 131* 132*  K 4.5 4.2 4.0  CL 99 97* 101  CO2 25 20* 21*  GLUCOSE 162* 133* 121*  BUN 39* 95* 89*  CREATININE 3.03* 5.21*  4.40*  CALCIUM 8.7* 8.2* 7.8*  PHOS  --   --  4.8*   GFR Estimated Creatinine Clearance: 8.7 mL/min (A) (by C-G formula based on SCr of 4.4 mg/dL (H)). Liver Function Tests: Recent Labs  Lab 08/20/23 0442 08/21/23 0759  AST 42*  --   ALT 13  --   ALKPHOS 80  --   BILITOT 1.1  --   PROT 7.2  --   ALBUMIN 2.8* 2.2*   No results for input(s): "LIPASE", "AMYLASE" in the last 168 hours. No results for input(s): "  AMMONIA" in the last 168 hours. Coagulation profile Recent Labs  Lab 08/17/23 0431 08/18/23 0640 08/19/23 0318 08/20/23 0442 08/21/23 0453  INR 2.3* 2.6* 3.3* 3.9* 4.2*    CBC: Recent Labs  Lab 08/19/23 0318 08/20/23 0442 08/21/23 0453  WBC 12.8* 16.6* 10.0  NEUTROABS  --  14.9* 7.7  HGB 10.0* 10.7* 8.9*  HCT 30.0* 33.2* 26.6*  MCV 90.9 92.7 90.8  PLT 235 275 241   Cardiac Enzymes: No results for input(s): "CKTOTAL", "CKMB", "CKMBINDEX", "TROPONINI" in the last 168 hours. BNP (last 3 results) No results for input(s): "PROBNP" in the last 8760 hours. CBG: Recent Labs  Lab 08/20/23 1143 08/20/23 1637 08/20/23 2059 08/21/23 0733 08/21/23 1127  GLUCAP 164* 121* 113* 113* 143*   D-Dimer: No results for input(s): "DDIMER" in the last 72 hours. Hgb A1c: No results for input(s): "HGBA1C" in the last 72 hours. Lipid Profile: No results for input(s): "CHOL", "HDL", "LDLCALC", "TRIG", "CHOLHDL", "LDLDIRECT" in the last 72 hours. Thyroid function studies: No results for input(s): "TSH", "T4TOTAL", "T3FREE", "THYROIDAB" in the last 72 hours.  Invalid input(s): "FREET3" Anemia work up: No results for input(s): "VITAMINB12", "FOLATE", "FERRITIN", "TIBC", "IRON", "RETICCTPCT" in the last 72 hours. Sepsis Labs: Recent Labs  Lab 08/19/23 0318 08/20/23 0442 08/21/23 0453  WBC 12.8* 16.6* 10.0    Microbiology No results found for this or any previous visit (from the past 240 hour(s)).  Procedures and diagnostic studies:  US RENAL  Result Date:  08/20/2023 CLINICAL DATA:  Acute kidney insufficiency EXAM: RENAL / URINARY TRACT ULTRASOUND COMPLETE COMPARISON:  Ultrasound 02/06/2021.  CT scan 08/14/2023 FINDINGS: Right Kidney: Previous right nephrectomy. Left Kidney: Renal measurements: 11.4 x 6.1 x 5.0 cm = volume: 180.8 mL. Minimal fullness of the renal collecting system, unchanged from prior CT scan. No adjacent fluid. Bladder: Bladder is contracted. Other: None. IMPRESSION: Previous right nephrectomy. Slight fullness of the left renal collecting system, similar to the prior CT scan. Contracted urinary bladder. Electronically Signed   By: Karen Kays M.D.   On: 08/20/2023 12:07               LOS: 7 days   Romaldo Saville  [facility]   Pager on [email or web address]. If 7PM-7AM, please contact night-coverage at [email or web address]     08/21/2023, 1:32 PM

## 2023-08-21 NOTE — TOC Progression Note (Signed)
Transition of Care [facility]) - Progression Note    Patient Details  Name: [name] MRN: [id number] Date of Birth: Nov 27, 1940  [facility]) CM/SW Contact  Marlowe Sax, RN Phone Number: 08/21/2023, 4:20 PM  Clinical Narrative:     Ins auth started and in pending Ref number [id number]  Expected Discharge Plan: Skilled Nursing Facility Barriers to Discharge: SNF Pending bed offer, Insurance Authorization  Expected Discharge Plan and Services   Discharge Planning Services: CM Consult   Living arrangements for the past 2 months: Single Family Home                                       Social Determinants of Health (SDOH) Interventions SDOH Screenings   Food Insecurity: No Food Insecurity (08/14/2023)  Housing: Low Risk  (08/14/2023)  Transportation Needs: No Transportation Needs (08/14/2023)  Utilities: Not At Risk (08/14/2023)  Alcohol Screen: Low Risk  (03/14/2022)  Depression (PHQ2-9): Low Risk  (08/06/2023)  Tobacco Use: Medium Risk (08/14/2023)    Readmission Risk Interventions    06/29/2021    8:45 AM 05/27/2021    2:34 PM  Readmission Risk Prevention Plan  Transportation Screening Complete Complete  Medication Review (RN Care Manager) Complete Complete  PCP or Specialist appointment within 3-5 days of discharge Complete Complete  HRI or Home Care Consult Complete Complete  SW Recovery Care/Counseling Consult Complete Complete  Palliative Care Screening Not Applicable Complete  Skilled Nursing Facility Not Applicable Complete

## 2023-08-21 NOTE — Progress Notes (Addendum)
PHARMACY - ANTICOAGULATION CONSULT NOTE  Pharmacy Consult for warfarin Indication: atrial fibrillation  No Known Allergies  Patient Measurements: Height: 4\' 10"  (147.3 cm) Weight: 78.9 kg (173 lb 15.1 oz) IBW/kg (Calculated) : 40.9  Vital Signs: Temp: 98.1 F (36.7 C) (10/29 0732) Temp Source: Oral (10/28 2323) BP: 104/53 (10/29 0732) Pulse Rate: 108 (10/29 0732)  Labs: Recent Labs    08/19/23 0318 08/20/23 0442 08/21/23 0453 08/21/23 0759  HGB 10.0* 10.7* 8.9*  --   HCT 30.0* 33.2* 26.6*  --   PLT 235 275 241  --   LABPROT 33.6* 38.5* 40.9*  --   INR 3.3* 3.9* 4.2*  --   CREATININE  --  5.21*  --  4.40*    Estimated Creatinine Clearance: 8.7 mL/min (A) (by C-G formula based on SCr of 4.4 mg/dL (H)).   Medical History: Past Medical History:  Diagnosis Date   Anemia    Atrial fibrillation (HCC)    Chronic kidney disease    Congestive heart failure (CHF) (HCC)    COPD (chronic obstructive pulmonary disease) (HCC)    Diabetes mellitus without complication (HCC)    GERD (gastroesophageal reflux disease)    Hyperlipidemia    Hypertension    Pneumonia    Pulmonary fibrosis (HCC)     Medications:  Warfarin 2 mg Tues, Thurs, Sat, Sun Warfarin 4 mg Mon, Wed, Fri Total weekly warfarin: 20 mg Average daily dose ~3 mg Last dose at home prior to admission 10/21 - 4mg  Note from last admission mentions 4 mg on Mon, Wed and 2 mg all other days  Assessment: 82 year old female presented with fall and right hip pain. PMH atrial fibrillation on warfarin.  No clinically significant drug interactions noted  Date INR Warfarin Dose  10/21 -- 4 mg (PTA)  10/22 1.7 3 mg  10/23 1.6 5 mg  10/24 2.0 2 mg   10/25 2.3 2 mg   10/26 2.6 2 mg  10/27 3.3 HOLD  10/28 3.9 HOLD  10/29 4.2 HOLD     Goal of Therapy:  INR 2-3 Monitor platelets by anticoagulation protocol: Yes   Plan:  INR increased again from yesterday, although less of a jump compared to the day prior.  Spoke with daughter over the phone to double-check that above home regimen is accurate and she confirms that it is but she endorses that every time patient is hospitalized, her INR gets wonky. With three held doses, patient will experience a 40% reduction in weekly warfarin. Daily INR. CBC at least every 3 days.   Will M. Dareen Piano, PharmD Clinical Pharmacist 08/21/2023 9:21 AM

## 2023-08-22 DIAGNOSIS — I480 Paroxysmal atrial fibrillation: Secondary | ICD-10-CM | POA: Diagnosis not present

## 2023-08-22 DIAGNOSIS — S72114A Nondisplaced fracture of greater trochanter of right femur, initial encounter for closed fracture: Secondary | ICD-10-CM | POA: Diagnosis not present

## 2023-08-22 DIAGNOSIS — I4891 Unspecified atrial fibrillation: Secondary | ICD-10-CM | POA: Diagnosis not present

## 2023-08-22 LAB — CBC WITH DIFFERENTIAL/PLATELET
Abs Immature Granulocytes: 0.14 10*3/uL — ABNORMAL HIGH (ref 0.00–0.07)
Basophils Absolute: 0 10*3/uL (ref 0.0–0.1)
Basophils Relative: 0 %
Eosinophils Absolute: 0.1 10*3/uL (ref 0.0–0.5)
Eosinophils Relative: 2 %
HCT: 27.9 % — ABNORMAL LOW (ref 36.0–46.0)
Hemoglobin: 8.9 g/dL — ABNORMAL LOW (ref 12.0–15.0)
Immature Granulocytes: 2 %
Lymphocytes Relative: 24 %
Lymphs Abs: 1.7 10*3/uL (ref 0.7–4.0)
MCH: 30 pg (ref 26.0–34.0)
MCHC: 31.9 g/dL (ref 30.0–36.0)
MCV: 93.9 fL (ref 80.0–100.0)
Monocytes Absolute: 0.5 10*3/uL (ref 0.1–1.0)
Monocytes Relative: 7 %
Neutro Abs: 4.8 10*3/uL (ref 1.7–7.7)
Neutrophils Relative %: 65 %
Platelets: 253 10*3/uL (ref 150–400)
RBC: 2.97 MIL/uL — ABNORMAL LOW (ref 3.87–5.11)
RDW: 14.3 % (ref 11.5–15.5)
WBC: 7.2 10*3/uL (ref 4.0–10.5)
nRBC: 0 % (ref 0.0–0.2)

## 2023-08-22 LAB — GLUCOSE, CAPILLARY
Glucose-Capillary: 113 mg/dL — ABNORMAL HIGH (ref 70–99)
Glucose-Capillary: 187 mg/dL — ABNORMAL HIGH (ref 70–99)
Glucose-Capillary: 99 mg/dL (ref 70–99)
Glucose-Capillary: 133 mg/dL — ABNORMAL HIGH (ref 70–99)

## 2023-08-22 LAB — PROTIME-INR
INR: 3.2 — ABNORMAL HIGH (ref 0.8–1.2)
Prothrombin Time: 33.1 s — ABNORMAL HIGH (ref 11.4–15.2)

## 2023-08-22 LAB — RENAL FUNCTION PANEL
Albumin: 2.2 g/dL — ABNORMAL LOW (ref 3.5–5.0)
Anion gap: 8 (ref 5–15)
BUN: 83 mg/dL — ABNORMAL HIGH (ref 8–23)
CO2: 24 mmol/L (ref 22–32)
Calcium: 8.2 mg/dL — ABNORMAL LOW (ref 8.9–10.3)
Chloride: 103 mmol/L (ref 98–111)
Creatinine, Ser: 3.65 mg/dL — ABNORMAL HIGH (ref 0.44–1.00)
GFR, Estimated: 12 mL/min — ABNORMAL LOW (ref >60)
Glucose, Bld: 113 mg/dL — ABNORMAL HIGH (ref 70–99)
Phosphorus: 4.2 mg/dL (ref 2.5–4.6)
Potassium: 4.5 mmol/L (ref 3.5–5.1)
Sodium: 135 mmol/L (ref 135–145)

## 2023-08-22 NOTE — Progress Notes (Signed)
PHARMACY - ANTICOAGULATION CONSULT NOTE  Pharmacy Consult for warfarin Indication: atrial fibrillation  No Known Allergies  Patient Measurements: Height: 4\' 10"  (147.3 cm) Weight: 81.6 kg (179 lb 14.3 oz) IBW/kg (Calculated) : 40.9  Vital Signs: Temp: 97.6 F (36.4 C) (10/29 2341) Temp Source: Oral (10/29 2341) BP: 102/47 (10/29 2341) Pulse Rate: 104 (10/29 2341)  Labs: Recent Labs    08/20/23 0442 08/21/23 0453 08/21/23 0759 08/22/23 0528  HGB 10.7* 8.9*  --  8.9*  HCT 33.2* 26.6*  --  27.9*  PLT 275 241  --  253  LABPROT 38.5* 40.9*  --  33.1*  INR 3.9* 4.2*  --  3.2*  CREATININE 5.21*  --  4.40* 3.65*    Estimated Creatinine Clearance: 10.7 mL/min (A) (by C-G formula based on SCr of 3.65 mg/dL (H)).   Medical History: Past Medical History:  Diagnosis Date   Anemia    Atrial fibrillation (HCC)    Chronic kidney disease    Congestive heart failure (CHF) (HCC)    COPD (chronic obstructive pulmonary disease) (HCC)    Diabetes mellitus without complication (HCC)    GERD (gastroesophageal reflux disease)    Hyperlipidemia    Hypertension    Pneumonia    Pulmonary fibrosis (HCC)     Medications:  Warfarin 2 mg Tues, Thurs, Sat, Sun Warfarin 4 mg Mon, Wed, Fri Total weekly warfarin: 20 mg Average daily dose ~3 mg Last dose at home prior to admission 10/21 - 4mg  Note from last admission mentions 4 mg on Mon, Wed and 2 mg all other days  Assessment: 82 year old female presented with fall and right hip pain. PMH atrial fibrillation on warfarin.  No clinically significant drug interactions noted  Date INR Warfarin Dose  10/21 -- 4 mg (PTA)  10/22 1.7 3 mg  10/23 1.6 5 mg  10/24 2.0 2 mg   10/25 2.3 2 mg   10/26 2.6 2 mg  10/27 3.3 HOLD  10/28 3.9 HOLD  10/29 4.2 HOLD  10/30 3.2 HOLD     Goal of Therapy:  INR 2-3 Monitor platelets by anticoagulation protocol: Yes   Plan: HOLD warfarin today. Follow up INR tomorrow. INR still supratherapeutic  but trending down significantly. Warfarin seemed to increase significantly after 5 mg dose on 10/23. Suspect reduction in discharge dosing will be necessary - appropriate discharge regimen for warfarin likely 2 mg daily. Daily INR. CBC at least every 3 days.   Elliot Gurney, PharmD, BCPS Clinical Pharmacist  08/22/2023 7:53 AM

## 2023-08-22 NOTE — TOC Progression Note (Signed)
Transition of Care [facility]) - Progression Note    Patient Details  Name: [name] MRN: [id number] Date of Birth: [birth year]  Transition of Care [facility]) CM/SW Contact  Marlowe Sax, RN Phone Number: 08/22/2023, 9:02 AM  Clinical Narrative:    [facility], Ins still pending   Expected Discharge Plan: Skilled Nursing Facility Barriers to Discharge: SNF Pending bed offer, Insurance Authorization  Expected Discharge Plan and Services   Discharge Planning Services: CM Consult   Living arrangements for the past 2 months: Single Family Home                                       Social Determinants of Health (SDOH) Interventions SDOH Screenings   Food Insecurity: No Food Insecurity (08/14/2023)  Housing: Low Risk  (08/14/2023)  Transportation Needs: No Transportation Needs (08/14/2023)  Utilities: Not At Risk (08/14/2023)  Alcohol Screen: Low Risk  (03/14/2022)  Depression (PHQ2-9): Low Risk  (08/06/2023)  Tobacco Use: Medium Risk (08/14/2023)    Readmission Risk Interventions    06/29/2021    8:45 AM 05/27/2021    2:34 PM  Readmission Risk Prevention Plan  Transportation Screening Complete Complete  Medication Review (RN Care Manager) Complete Complete  PCP or Specialist appointment within 3-5 days of discharge Complete Complete  HRI or Home Care Consult Complete Complete  SW Recovery Care/Counseling Consult Complete Complete  Palliative Care Screening Not Applicable Complete  Skilled Nursing Facility Not Applicable Complete

## 2023-08-22 NOTE — Progress Notes (Signed)
[facility]  - [place] at [facility]   PATIENT NAME: [name]    MR#:  [id number]  DATE OF BIRTH:  07/05/1941  SUBJECTIVE:      VITALS:  Blood pressure (!) 100/56, pulse 97, temperature 97.6 F (36.4 C), resp. rate 16, height 4\' 10"  (1.473 m), weight 81.6 kg, SpO2 100%.  PHYSICAL EXAMINATION:   GENERAL:  82 y.o.-year-old patient with no acute distress.  LUNGS: Normal breath sounds bilaterally, no wheezing CARDIOVASCULAR: S1, S2 normal. No murmur   ABDOMEN: Soft, nontender, nondistended. Bowel sounds present. FOLEY+ EXTREMITIES: No  edema b/l.   Right abductor brace NEUROLOGIC: nonfocal  patient is alert and awake   LABORATORY PANEL:  CBC Recent Labs  Lab 08/22/23 0528  WBC 7.2  HGB 8.9*  HCT 27.9*  PLT 253    Chemistries  Recent Labs  Lab 08/20/23 0442 08/21/23 0759 08/22/23 0528  NA 131*   < > 135  K 4.2   < > 4.5  CL 97*   < > 103  CO2 20*   < > 24  GLUCOSE 133*   < > 113*  BUN 95*   < > 83*  CREATININE 5.21*   < > 3.65*  CALCIUM 8.2*   < > 8.2*  AST 42*  --   --   ALT 13  --   --   ALKPHOS 80  --   --   BILITOT 1.1  --   --    < > = values in this interval not displayed.   Assessment and Plan  [name] is a 82 y.o. female with medical history significant of PAF on warfarin, off rate control medications, due to persistent bradycardia, COPD, CKD stage IV, IDDM, HTN, HLD, brought in by family member after a mechanical fall.   Patient fell on her right side and hit her right hip about sitting down on her bed and she missed the edge of the bed.  Excruciating pain and unable to stand on herself.  Denied any LOC, no head injury.    CT abdomen pelvis showed right greater trochanter fracture  CT head and neck negative for acute findings.    Acute right great trochanter nondisplaced fracture -Orthopedic surgery dr Ellene Route input appreciated--Conservative mnx with pain meds, abductor brace and PT/OT -Conservative  management -Pain control  AKI on CKD IV with acute urinary retention --baseline creat 2.4--2.5 --creat trended upto 5.2--diuretics held--IVF--creat 3.6 --Pt will f/u with Dr Wynelle Link as outpt -- Foley catheter was removed. Patient continues to retain urine. Place Foley catheter back in and patient will follow-up with urology Dr Richardo Hanks on nov 19th for voiding trial. Patient son is aware.   Acute on chronic HFpEF decompensation -Likely secondary to uncontrolled A-fib -Start IV Lasix 20 mg twice daily--change to po home dose torsemide  -Monitor kidney function --sats >90% on RA   A-fib with RVR -As needed Lopressor -Continue Coumadin per pharmacy --INR 3.2   DM with stable sugars with CKD-IV -SSI for now   TOC for d/c planning PT to see--use abductor brace per ortho instructions      Procedures:none Family communication :son at bedside Consults :ortho CODE STATUS: DNR DVT Prophylaxis :warfarin Level of care: Telemetry Medical Status is: Inpatient Remains inpatient appropriate because: waiting for Insurance auth    TOTAL TIME TAKING CARE OF THIS PATIENT: 35 minutes.  >50% time spent on counselling and coordination of care  Note: This dictation was prepared with Dragon dictation along  with smaller phrase technology. Any transcriptional errors that result from this process are unintentional.  Enedina Finner M.D    [facility]   CC: Primary care physician; Sallyanne Kuster, NP

## 2023-08-22 NOTE — TOC Progression Note (Signed)
Transition of Care [facility]) - Progression Note    Patient Details  Name: [name] MRN: [id number] Date of Birth: Jul 08, 1941  [facility] of [facility]) CM/SW Contact  Marlowe Sax, RN Phone Number: 08/22/2023, 2:05 PM  Clinical Narrative:     Reached out to [facility] and the Ins auth still pending  Expected Discharge Plan: Skilled Nursing Facility Barriers to Discharge: SNF Pending bed offer, Insurance Authorization  Expected Discharge Plan and Services   Discharge Planning Services: CM Consult   Living arrangements for the past 2 months: Single Family Home                                       Social Determinants of Health (SDOH) Interventions SDOH Screenings   Food Insecurity: No Food Insecurity (08/14/2023)  Housing: Low Risk  (08/14/2023)  Transportation Needs: No Transportation Needs (08/14/2023)  Utilities: Not At Risk (08/14/2023)  Alcohol Screen: Low Risk  (03/14/2022)  Depression (PHQ2-9): Low Risk  (08/06/2023)  Tobacco Use: Medium Risk (08/14/2023)    Readmission Risk Interventions    06/29/2021    8:45 AM 05/27/2021    2:34 PM  Readmission Risk Prevention Plan  Transportation Screening Complete Complete  Medication Review (RN Care Manager) Complete Complete  PCP or Specialist appointment within 3-5 days of discharge Complete Complete  HRI or Home Care Consult Complete Complete  SW Recovery Care/Counseling Consult Complete Complete  Palliative Care Screening Not Applicable Complete  Skilled Nursing Facility Not Applicable Complete

## 2023-08-22 NOTE — Progress Notes (Addendum)
Occupational Therapy Treatment Patient Details Name: [name] MRN: [id number] DOB: [birth year] Today's Date: 08/22/2023   History of present illness Patient is a 82 year old female with mechanical fall with tip of the right greater trochanter avulsion fracture with hip abduction brace recommended. History of PAF, bradycardia, COPD, CKD, DM, HTN, HLD.   OT comments  Pt is supine in bed on arrival. Easily arousable and agreeable to OT session. She reports pain as somewhat improved since getting pain meds up to 8/10 with movement. Pt performed bed mobility and continues to need Mod to Max A. Transferred bed<>BSC with Min to Mod A x2 via shuffling steps today. Pt required Max A x2 to scoot to [facility].  Pt returned to bed with all needs in place and will cont to require skilled acute OT services to maximize her safety and IND to return to PLOF.       If plan is discharge home, recommend the following:  A lot of help with bathing/dressing/bathroom;A lot of help with walking and/or transfers;Two people to help with walking and/or transfers;Assist for transportation;Assistance with cooking/housework;Help with stairs or ramp for entrance;Direct supervision/assist for medications management;Supervision due to cognitive status;Direct supervision/assist for financial management   Equipment Recommendations  Other (comment) (defer)    Recommendations for Other Services      Precautions / Restrictions Precautions Precautions: Fall Precaution Comments: vision impaired Required Braces or Orthoses: Other Brace Other Brace: hip abduction brace Restrictions Weight Bearing Restrictions: Yes RLE Weight Bearing: Weight bearing as tolerated Other Position/Activity Restrictions: no active right hip abduction       Mobility Bed Mobility Overal bed mobility: Needs Assistance Bed Mobility: Supine to Sit, Sit to Supine     Supine to sit: Mod assist, Used rails, HOB elevated Sit to supine: Max assist,  HOB elevated   General bed mobility comments: MOD A with hand placement on bedrail to get to seated EOB, Max A to return to supine and Max A x2 to scoot to [facility]    Transfers Overall transfer level: Needs assistance Equipment used: 2 person hand held assist Transfers: Sit to/from Stand, Bed to chair/wheelchair/BSC Sit to Stand: Mod assist, +2 safety/equipment     Step pivot transfers: Min assist, Mod assist, +2 safety/equipment     General transfer comment: Min/Mod A x2 for safety with transfer EOB<>BSC     Balance Overall balance assessment: Needs assistance Sitting-balance support: Bilateral upper extremity supported, Feet supported Sitting balance-Leahy Scale: Fair     Standing balance support: Bilateral upper extremity supported, Reliant on assistive device for balance Standing balance-Leahy Scale: Fair Standing balance comment: needs Min/Mod A to maintain standing balance during posterior hygiene                           ADL either performed or assessed with clinical judgement   ADL Overall ADL's : Needs assistance/impaired                         Toilet Transfer: Stand-pivot;+2 for physical assistance;Minimal assistance;Cueing for safety Toilet Transfer Details (indicate cue type and reason): Min to Mod of 2 for [facility] transfer Toileting- Clothing Manipulation and Hygiene: Sit to/from stand;Total assistance Toileting - Clothing Manipulation Details (indicate cue type and reason): standing in front of [facility]            Extremity/Trunk Assessment Upper Extremity Assessment Upper Extremity Assessment: Generalized weakness   Lower Extremity Assessment Lower  Extremity Assessment: Generalized weakness        Vision       Perception     Praxis      Cognition Arousal: Alert Behavior During Therapy: Flat affect Overall Cognitive Status: History of cognitive impairments - at baseline                                 General  Comments: cooperative but has baseline cognition concerns        Exercises      Shoulder Instructions       General Comments      Pertinent Vitals/ Pain       Pain Assessment Pain Assessment: 0-10 Pain Score: 8  Pain Location: R hip with movement Pain Descriptors / Indicators: Discomfort Pain Intervention(s): Monitored during session, Limited activity within patient's tolerance  Home Living                                          Prior Functioning/Environment              Frequency  Min 1X/week        Progress Toward Goals  OT Goals(current goals can now be found in the care plan section)  Progress towards OT goals: Progressing toward goals  Acute Rehab OT Goals Patient Stated Goal: improve strength OT Goal Formulation: With patient/family Time For Goal Achievement: 08/30/23 Potential to Achieve Goals: Fair  Plan      Co-evaluation        PT goals addressed during session: Mobility/safety with mobility;Balance;Proper use of DME;Strengthening/ROM        AM-PAC OT "6 Clicks" Daily Activity     Outcome Measure   Help from another person eating meals?: A Little Help from another person taking care of personal grooming?: A Little Help from another person toileting, which includes using toliet, bedpan, or urinal?: A Lot Help from another person bathing (including washing, rinsing, drying)?: A Lot Help from another person to put on and taking off regular upper body clothing?: A Little Help from another person to put on and taking off regular lower body clothing?: A Lot 6 Click Score: 15    End of Session    OT Visit Diagnosis: Pain;History of falling (Z91.81);Muscle weakness (generalized) (M62.81);Other abnormalities of gait and mobility (R26.89) Pain - Right/Left: Right Pain - part of body: Hip   Activity Tolerance Patient tolerated treatment well   Patient Left in bed;with call bell/phone within reach;with bed alarm set;with  family/visitor present   Nurse Communication Mobility status        Time: 1352-1410 OT Time Calculation (min): 18 min  Charges: OT General Charges $OT Visit: 1 Visit OT Treatments $Self Care/Home Management : 8-22 mins  Taneka Espiritu, OTR/L  08/22/23, 4:08 PM  Catalino Plascencia E Alyaan Budzynski 08/22/2023, 4:08 PM

## 2023-08-22 NOTE — Progress Notes (Signed)
Physical Therapy Treatment Patient Details Name: [name] MRN: [id number] DOB: 07-24-1941 Today's Date: 08/22/2023   History of Present Illness Patient is a 82 year old female with mechanical fall with tip of the right greater trochanter avulsion fracture with hip abduction brace recommended. History of PAF, bradycardia, COPD, CKD, DM, HTN, HLD.    PT Comments  Pt was alert, long sitting in bed, upon arrival. She is cooperative and pleasant throughout but does present with cognition deficits that family has reported at baseline. Pt is agreeable and requires much less assistance today. HR stayed between 92 bpm and 122 bpm throughout session. She tolerated getting OOB and standing 2 x prior to taking a few steps to recliner. Overall, much improved abilities today versus previous. RN aware pt is in recliner. Author will return later to assist pt with retuning to bed. Highly recommend DC to SNF to maximize independence while decreasing caregiver burden.    If plan is discharge home, recommend the following: A lot of help with walking and/or transfers;A lot of help with bathing/dressing/bathroom;Assistance with cooking/housework;Help with stairs or ramp for entrance;Assist for transportation     Equipment Recommendations  None recommended by PT       Precautions / Restrictions Precautions Precautions: Fall Precaution Comments: vision impaired Required Braces or Orthoses: Other Brace Other Brace: hip abduction brace Restrictions Weight Bearing Restrictions: Yes RLE Weight Bearing: Weight bearing as tolerated Other Position/Activity Restrictions: no active right hip abduction     Mobility  Bed Mobility Overal bed mobility: Needs Assistance Bed Mobility: Supine to Sit  Supine to sit: Mod assist, Used rails, HOB elevated  General bed mobility comments: increased time + mod assist to exit bed    Transfers Overall transfer level: Needs assistance Equipment used: Rolling walker (2  wheels) Transfers: Sit to/from Stand, Bed to chair/wheelchair/BSC Sit to Stand: Mod assist, From elevated surface Step pivot transfers: Mod assist  General transfer comment: pt stood 2 x EOB prior to stand pivot to recliner    Ambulation/Gait Ambulation/Gait assistance: Min assist Gait Distance (Feet): 3 Feet Assistive device: Rolling walker (2 wheels) Gait Pattern/deviations: Antalgic, Step-to pattern, Shuffle, Trunk flexed Gait velocity: decreased General Gait Details: Pt was able to take several antalgic shuffling steps form EOB to recliner     Balance Overall balance assessment: Needs assistance Sitting-balance support: Bilateral upper extremity supported, Feet supported Sitting balance-Leahy Scale: Fair     Standing balance support: Bilateral upper extremity supported, Reliant on assistive device for balance Standing balance-Leahy Scale: Fair       Cognition Arousal: Alert Behavior During Therapy: Flat affect Overall Cognitive Status: History of cognitive impairments - at baseline    General Comments: Pt is alert and agreeable however does have some baseline congition deficits come to light               Pertinent Vitals/Pain Pain Assessment Pain Assessment: No/denies pain Pain Score: 0-No pain    PT Goals (current goals can now be found in the care plan section) Acute Rehab PT Goals Patient Stated Goal: none stated Progress towards PT goals: Progressing toward goals    Frequency    Min 1X/week       Co-evaluation     PT goals addressed during session: Mobility/safety with mobility;Balance;Proper use of DME;Strengthening/ROM        AM-PAC PT "6 Clicks" Mobility   Outcome Measure  Help needed turning from your back to your side while in a flat bed without using bedrails?: A  Lot Help needed moving from lying on your back to sitting on the side of a flat bed without using bedrails?: A Lot Help needed moving to and from a bed to a chair (including  a wheelchair)?: A Lot Help needed standing up from a chair using your arms (e.g., wheelchair or bedside chair)?: A Lot Help needed to walk in hospital room?: A Lot Help needed climbing 3-5 steps with a railing? : Total 6 Click Score: 11    End of Session Equipment Utilized During Treatment: Gait belt Activity Tolerance: Patient tolerated treatment well Patient left: in chair;with call bell/phone within reach Nurse Communication: Mobility status PT Visit Diagnosis: Unsteadiness on feet (R26.81);Muscle weakness (generalized) (M62.81)     Time: 5956-3875 PT Time Calculation (min) (ACUTE ONLY): 17 min  Charges:    $Therapeutic Activity: 8-22 mins PT General Charges $$ ACUTE PT VISIT: 1 Visit                     Jetta Lout PTA 08/22/23, 11:50 AM

## 2023-08-23 DIAGNOSIS — I480 Paroxysmal atrial fibrillation: Secondary | ICD-10-CM | POA: Diagnosis not present

## 2023-08-23 DIAGNOSIS — I4891 Unspecified atrial fibrillation: Secondary | ICD-10-CM | POA: Diagnosis not present

## 2023-08-23 DIAGNOSIS — S72114A Nondisplaced fracture of greater trochanter of right femur, initial encounter for closed fracture: Secondary | ICD-10-CM | POA: Diagnosis not present

## 2023-08-23 LAB — GLUCOSE, CAPILLARY
Glucose-Capillary: 108 mg/dL — ABNORMAL HIGH (ref 70–99)
Glucose-Capillary: 146 mg/dL — ABNORMAL HIGH (ref 70–99)
Glucose-Capillary: 161 mg/dL — ABNORMAL HIGH (ref 70–99)
Glucose-Capillary: 146 mg/dL — ABNORMAL HIGH (ref 70–99)

## 2023-08-23 LAB — PROTIME-INR
INR: 2.8 — ABNORMAL HIGH (ref 0.8–1.2)
Prothrombin Time: 29.7 s — ABNORMAL HIGH (ref 11.4–15.2)

## 2023-08-23 MED ORDER — WARFARIN SODIUM 2 MG PO TABS
2.0000 mg | ORAL_TABLET | Freq: Once | ORAL | Status: AC
  Administered 2023-08-23: 2 mg via ORAL
  Filled 2023-08-23: qty 1

## 2023-08-23 MED ORDER — CEPHALEXIN 250 MG PO CAPS
250.0000 mg | ORAL_CAPSULE | Freq: Every day | ORAL | Status: DC
  Administered 2023-08-23 – 2023-08-24 (×2): 250 mg via ORAL
  Filled 2023-08-23 (×3): qty 1

## 2023-08-23 NOTE — Progress Notes (Signed)
[facility]  - [place] at [facility]   PATIENT NAME: [name]    MR#:  [id number]  DATE OF BIRTH:  July 10, 1941  SUBJECTIVE:  patient worked with physical therapy. Has Claudia urine in the Foley bag. Patient has baseline dementia unable to give any symptoms of UTI given catheter and cloudy urine will treat. No fever.    VITALS:  Blood pressure (!) 107/36, pulse (!) 51, temperature 98.2 F (36.8 C), resp. rate 14, height 4\' 10"  (1.473 m), weight 77.6 kg, SpO2 97%.  PHYSICAL EXAMINATION:   GENERAL:  82 y.o.-year-old patient with no acute distress.  LUNGS: Normal breath sounds bilaterally, no wheezing CARDIOVASCULAR: S1, S2 normal. No murmur   ABDOMEN: Soft, nontender, nondistended. FOLEY+ EXTREMITIES: No  edema b/l.   Right abductor brace NEUROLOGIC: nonfocal  patient is alert and awake   LABORATORY PANEL:  CBC Recent Labs  Lab 08/22/23 0528  WBC 7.2  HGB 8.9*  HCT 27.9*  PLT 253    Chemistries  Recent Labs  Lab 08/20/23 0442 08/21/23 0759 08/22/23 0528  NA 131*   < > 135  K 4.2   < > 4.5  CL 97*   < > 103  CO2 20*   < > 24  GLUCOSE 133*   < > 113*  BUN 95*   < > 83*  CREATININE 5.21*   < > 3.65*  CALCIUM 8.2*   < > 8.2*  AST 42*  --   --   ALT 13  --   --   ALKPHOS 80  --   --   BILITOT 1.1  --   --    < > = values in this interval not displayed.   Assessment and Plan  [name] is a 82 y.o. female with medical history significant of PAF on warfarin, off rate control medications, due to persistent bradycardia, COPD, CKD stage IV, IDDM, HTN, HLD, brought in by family member after a mechanical fall.   Patient fell on her right side and hit her right hip about sitting down on her bed and she missed the edge of the bed.  Excruciating pain and unable to stand on herself.  Denied any LOC, no head injury.    CT abdomen pelvis showed right greater trochanter fracture  CT head and neck negative for acute findings.    Acute right great  trochanter nondisplaced fracture -Orthopedic surgery dr Ellene Route input appreciated--Conservative mnx with pain meds, abductor brace and PT/OT -Conservative management -Pain control  AKI on CKD IV with acute urinary retention presumed UTI --baseline creat 2.4--2.5 --creat trended upto 5.2--diuretics held--IVF--creat 3.6 --Pt will f/u with Dr Wynelle Link as outpt -- Foley catheter was removed. Patient continues to retain urine. Place Foley catheter back in and patient will follow-up with urology Dr Richardo Hanks on nov 19th for voiding trial. Patient son is aware. --Kelfex for 7 days. UC and UA to be sent   Acute on chronic HFpEF decompensation -Likely secondary to uncontrolled A-fib -torsemide on hold due to creat rise -Monitor kidney function --sats >90% on RA   A-fib with RVR -As needed Lopressor -Continue Coumadin per pharmacy --INR 2.8   DM with stable sugars with CKD-IV -SSI for now   TOC for d/c planning PT to see--use abductor brace per ortho instructions      Procedures:none Family communication :son at bedside Consults :ortho CODE STATUS: DNR DVT Prophylaxis :warfarin Level of care: Telemetry Medical Status is: Inpatient Remains inpatient appropriate because: waiting  for Insurance auth    TOTAL TIME TAKING CARE OF THIS PATIENT: 35 minutes.  >50% time spent on counselling and coordination of care  Note: This dictation was prepared with Dragon dictation along with smaller phrase technology. Any transcriptional errors that result from this process are unintentional.  [name] M.D    [facility]   CC: Primary care physician; Sallyanne Kuster, NP

## 2023-08-23 NOTE — Progress Notes (Signed)
PHARMACY - ANTICOAGULATION CONSULT NOTE  Pharmacy Consult for warfarin Indication: atrial fibrillation  No Known Allergies  Patient Measurements: Height: 4\' 10"  (147.3 cm) Weight: 77.6 kg (171 lb 1.2 oz) IBW/kg (Calculated) : 40.9  Vital Signs: Temp: 97.9 F (36.6 C) (10/31 0001) BP: 96/53 (10/31 0001) Pulse Rate: 70 (10/31 0001)  Labs: Recent Labs    08/21/23 0453 08/21/23 0759 08/22/23 0528 08/23/23 0554  HGB 8.9*  --  8.9*  --   HCT 26.6*  --  27.9*  --   PLT 241  --  253  --   LABPROT 40.9*  --  33.1* 29.7*  INR 4.2*  --  3.2* 2.8*  CREATININE  --  4.40* 3.65*  --     Estimated Creatinine Clearance: 10.4 mL/min (A) (by C-G formula based on SCr of 3.65 mg/dL (H)).   Medical History: Past Medical History:  Diagnosis Date   Anemia    Atrial fibrillation (HCC)    Chronic kidney disease    Congestive heart failure (CHF) (HCC)    COPD (chronic obstructive pulmonary disease) (HCC)    Diabetes mellitus without complication (HCC)    GERD (gastroesophageal reflux disease)    Hyperlipidemia    Hypertension    Pneumonia    Pulmonary fibrosis (HCC)    Medications:  Warfarin 2 mg Tues, Thurs, Sat, Sun Warfarin 4 mg Mon, Wed, Fri Total weekly warfarin: 20 mg Average daily dose ~3 mg Last dose at home prior to admission 10/21 - 4mg  Note from last admission mentions 4 mg on Mon, Wed and 2 mg all other days  Assessment: 82 year old female presented with fall and right hip pain. PMH atrial fibrillation on warfarin.  No clinically significant drug interactions noted  Date INR Warfarin Dose  10/21 -- 4 mg (PTA)  10/22 1.7 3 mg  10/23 1.6 5 mg  10/24 2.0 2 mg   10/25 2.3 2 mg   10/26 2.6 2 mg  10/27 3.3 HOLD  10/28 3.9 HOLD  10/29 4.2 HOLD  10/30 3.2 HOLD  10/31 2.8 2 mg   Goal of Therapy:  INR 2-3 Monitor platelets by anticoagulation protocol: Yes   Plan: INR is therapeutic this morning. Last CBC was stable, no signs/symptoms of bleeding noted.  Give  warfarin 2 mg x1 today Monitor INR daily while inpatient Monitor CBC and signs/symptoms of bleeding Of note, INR seemed to increase significantly after 5 mg dose on 10/23. Suspect reduction in discharge dosing will be necessary - appropriate discharge regimen for warfarin likely 2 mg daily.  Thank you for involving pharmacy in this patient's care.   Rockwell Alexandria, PharmD Clinical Pharmacist 08/23/2023 7:31 AM

## 2023-08-23 NOTE — Plan of Care (Signed)
  Problem: Education: Goal: Ability to describe self-care measures that may prevent or decrease complications (Diabetes Survival Skills Education) will improve Outcome: Progressing Goal: Individualized Educational Video(s) Outcome: Progressing   Problem: Coping: Goal: Ability to adjust to condition or change in health will improve Outcome: Progressing   Problem: Fluid Volume: Goal: Ability to maintain a balanced intake and output will improve Outcome: Progressing   Problem: Health Behavior/Discharge Planning: Goal: Ability to identify and utilize available resources and services will improve Outcome: Progressing Goal: Ability to manage health-related needs will improve Outcome: Progressing   Problem: Metabolic: Goal: Ability to maintain appropriate glucose levels will improve Outcome: Progressing   Problem: Nutritional: Goal: Maintenance of adequate nutrition will improve Outcome: Progressing Goal: Progress toward achieving an optimal weight will improve Outcome: Progressing   Problem: Skin Integrity: Goal: Risk for impaired skin integrity will decrease Outcome: Progressing   Problem: Tissue Perfusion: Goal: Adequacy of tissue perfusion will improve Outcome: Progressing   Problem: Education: Goal: Knowledge of General Education information will improve Description: Including pain rating scale, medication(s)/side effects and non-pharmacologic comfort measures Outcome: Progressing   Problem: Clinical Measurements: Goal: Ability to maintain clinical measurements within normal limits will improve Outcome: Progressing Goal: Will remain free from infection Outcome: Progressing Goal: Diagnostic test results will improve Outcome: Progressing Goal: Respiratory complications will improve Outcome: Progressing Goal: Cardiovascular complication will be avoided Outcome: Progressing   Problem: Activity: Goal: Risk for activity intolerance will decrease Outcome: Progressing    Problem: Coping: Goal: Level of anxiety will decrease Outcome: Progressing   Problem: Nutrition: Goal: Adequate nutrition will be maintained Outcome: Progressing   Problem: Elimination: Goal: Will not experience complications related to bowel motility Outcome: Progressing Goal: Will not experience complications related to urinary retention Outcome: Progressing   Problem: Pain Management: Goal: General experience of comfort will improve Outcome: Progressing   Problem: Safety: Goal: Ability to remain free from injury will improve Outcome: Progressing   Problem: Skin Integrity: Goal: Risk for impaired skin integrity will decrease Outcome: Progressing

## 2023-08-23 NOTE — Progress Notes (Signed)
Occupational Therapy Treatment Patient Details Name: [name] MRN: [id number] DOB: [birth year] Today's Date: 08/23/2023   History of present illness Patient is a 82 year old female with mechanical fall with tip of the right greater trochanter avulsion fracture with hip abduction brace recommended. History of PAF, bradycardia, COPD, CKD, DM, HTN, HLD.   OT comments  Pt is supine in bed on arrival. Pleasant and agreeable to OT session. No explicit c/o pain during session. Pt performed bed mobility with less assist today than yesterday and continues to make improvements. Engaged in ADL activity seated EOB with SUP/SBA. STS x2 with Min/MOD A to RW and ability to take lateral steps toward foot and HOB with Min/Mod A. Pt returned to bed with all needs in place and will cont to require skilled acute OT services to promote improved strength, IND and safety with transfers, ADLs, and mobility to return to PLOF. SNF remains appropriate DC recommendation.       If plan is discharge home, recommend the following:  A lot of help with bathing/dressing/bathroom;A lot of help with walking and/or transfers;Two people to help with walking and/or transfers;Assist for transportation;Assistance with cooking/housework;Help with stairs or ramp for entrance;Direct supervision/assist for medications management;Supervision due to cognitive status;Direct supervision/assist for financial management   Equipment Recommendations  Other (comment) (defer)    Recommendations for Other Services      Precautions / Restrictions Precautions Precautions: Fall Precaution Comments: vision impaired Required Braces or Orthoses: Other Brace Other Brace: hip abduction brace Restrictions Weight Bearing Restrictions: Yes RLE Weight Bearing: Weight bearing as tolerated Other Position/Activity Restrictions: no active right hip abduction       Mobility Bed Mobility Overal bed mobility: Needs Assistance Bed Mobility: Supine  to Sit, Sit to Supine     Supine to sit: Used rails, HOB elevated, Min assist, Mod assist Sit to supine: HOB elevated, Mod assist, Max assist   General bed mobility comments: improving with bed mobility daily, Min to Mod A required to sit at EOB with cueing for hand placement on bed rails; still requires Mod to Max A for BLE management to return to bed    Transfers Overall transfer level: Needs assistance Equipment used: Rolling walker (2 wheels) Transfers: Sit to/from Stand Sit to Stand: Min assist, Mod assist           General transfer comment: Pt performed STS at EOB x2 during session with Min/Mod A to RW with ability to take lateral steps toward foot of bed and HOB     Balance Overall balance assessment: Needs assistance Sitting-balance support: Bilateral upper extremity supported, Feet supported Sitting balance-Leahy Scale: Fair Sitting balance - Comments: SBA for seated balance once achieved   Standing balance support: Bilateral upper extremity supported, Reliant on assistive device for balance Standing balance-Leahy Scale: Fair Standing balance comment: Min A to maintain balance while taking lateral steps                           ADL either performed or assessed with clinical judgement   ADL       Grooming: Oral care;Brushing hair;Supervision/safety;Sitting Grooming Details (indicate cue type and reason): SUP/SBA for seated balance to perform grooming tasks at EOB                             Functional mobility during ADLs: Minimal assistance;Cueing for safety;Rolling walker (2 wheels)  Extremity/Trunk Assessment Upper Extremity Assessment Upper Extremity Assessment: Generalized weakness   Lower Extremity Assessment Lower Extremity Assessment: Generalized weakness        Vision       Perception     Praxis      Cognition Arousal: Alert Behavior During Therapy: WFL for tasks assessed/performed Overall Cognitive Status:  History of cognitive impairments - at baseline                                 General Comments: pt alert, more vocal, remains cooperative and ready for therapy; has baseline cognition deficits per family        Exercises      Shoulder Instructions       General Comments reviewed importance of continueing to perform OOB activity and ther ex to promote strengthening and improved independnece    Pertinent Vitals/ Pain       Pain Assessment Pain Assessment: Faces Faces Pain Scale: Hurts a little bit Pain Location: R hip with movement Pain Descriptors / Indicators: Discomfort Pain Intervention(s): Monitored during session, Limited activity within patient's tolerance  Home Living                                          Prior Functioning/Environment              Frequency  Min 1X/week        Progress Toward Goals  OT Goals(current goals can now be found in the care plan section)  Progress towards OT goals: Progressing toward goals  Acute Rehab OT Goals Patient Stated Goal: improve strenght OT Goal Formulation: With patient/family Time For Goal Achievement: 08/30/23 Potential to Achieve Goals: Fair  Plan      Co-evaluation        PT goals addressed during session: Mobility/safety with mobility;Balance;Proper use of DME;Strengthening/ROM        AM-PAC OT "6 Clicks" Daily Activity     Outcome Measure   Help from another person eating meals?: A Little Help from another person taking care of personal grooming?: A Little Help from another person toileting, which includes using toliet, bedpan, or urinal?: A Lot Help from another person bathing (including washing, rinsing, drying)?: A Lot Help from another person to put on and taking off regular upper body clothing?: A Little Help from another person to put on and taking off regular lower body clothing?: A Lot 6 Click Score: 15    End of Session Equipment Utilized During  Treatment: Rolling walker (2 wheels)  OT Visit Diagnosis: Pain;History of falling (Z91.81);Muscle weakness (generalized) (M62.81);Other abnormalities of gait and mobility (R26.89) Pain - Right/Left: Right Pain - part of body: Hip   Activity Tolerance Patient tolerated treatment well   Patient Left in bed;with call bell/phone within reach;with bed alarm set;with family/visitor present   Nurse Communication Mobility status        Time: 1400-1420 OT Time Calculation (min): 20 min  Charges: OT General Charges $OT Visit: 1 Visit OT Treatments $Self Care/Home Management : 8-22 mins  Willistine Ferrall, OTR/L  08/23/23, 3:28 PM   Devone Bonilla E Cullan Launer 08/23/2023, 3:26 PM

## 2023-08-23 NOTE — Progress Notes (Signed)
Physical Therapy Treatment Patient Details Name: [name] MRN: [id number] DOB: 1941/04/10 Today's Date: 08/23/2023   History of Present Illness Patient is a 82 year old female with mechanical fall with tip of the right greater trochanter avulsion fracture with hip abduction brace recommended. History of PAF, bradycardia, COPD, CKD, DM, HTN, HLD.    PT Comments  Pt was alert, long sitting, in bed upon arrival. She remains cooperative and pleasant. Oriented x 2. Agreeable to OOB activity and attempt to ambulate. Pt does continue to require assistance for getting OOB. She tolerates, Standing to RW 2 x prior to ambulating ~ 8 ft with slow shuffling flex posture. Pt is progressing towards all rehab goals but remains limited by pain and fatigue. Recommend continued skilled PT at DC to maximize independence and safety with all ADLs.    If plan is discharge home, recommend the following: A lot of help with walking and/or transfers;A lot of help with bathing/dressing/bathroom;Assistance with cooking/housework;Help with stairs or ramp for entrance;Assist for transportation     Equipment Recommendations  Other (comment) (Defer to next level of care)       Precautions / Restrictions Precautions Precautions: Fall Precaution Comments: vision impaired Required Braces or Orthoses: Other Brace Other Brace: hip abduction brace Restrictions Weight Bearing Restrictions: Yes RLE Weight Bearing: Weight bearing as tolerated Other Position/Activity Restrictions: no active right hip abduction     Mobility  Bed Mobility Overal bed mobility: Needs Assistance Bed Mobility: Supine to Sit, Sit to Supine Rolling: Mod assist Supine to sit: Mod assist, Used rails, HOB elevated  General bed mobility comments: pt continues to require assistance to achieve EOB sitting. st EOB for ~ 2 minutes prior to standing and ambulating short distance    Transfers Overall transfer level: Needs assistance Equipment used:  Rolling walker (2 wheels) Transfers: Sit to/from Stand, Bed to chair/wheelchair/BSC Sit to Stand: Min assist, Mod assist, From elevated surface  General transfer comment: pt stood 2 x EOB prior to ambulaing ~ 8 ft with recliner follow    Ambulation/Gait Ambulation/Gait assistance: Min assist, Mod assist Gait Distance (Feet): 8 Feet Assistive device: Rolling walker (2 wheels) Gait Pattern/deviations: Shuffle, Antalgic, Decreased step length - right, Decreased step length - left, Decreased stance time - right, Decreased stance time - left, Step-to pattern Gait velocity: decreased  General Gait Details: Pt was able to ambulate ~ 8 ft with poor overall gait quality. pt puts forth great effort but did fatigue quickly    Balance Overall balance assessment: Needs assistance Sitting-balance support: Bilateral upper extremity supported, Feet supported Sitting balance-Leahy Scale: Fair     Standing balance support: Bilateral upper extremity supported, Reliant on assistive device for balance Standing balance-Leahy Scale: Fair       Cognition Arousal: Alert Behavior During Therapy: Flat affect Overall Cognitive Status: History of cognitive impairments - at baseline      General Comments: Pt remains A and cooperative. has basleine cogntiion deficits per family           General Comments General comments (skin integrity, edema, etc.): reviewed importance of continueing to perform OOB activity and ther ex to promote strengthening and improved independnece      Pertinent Vitals/Pain Pain Assessment Pain Assessment: 0-10 Pain Score: 2  Faces Pain Scale: Hurts a little bit Pain Location: R hip with movement Pain Descriptors / Indicators: Discomfort Pain Intervention(s): Limited activity within patient's tolerance, Monitored during session, Premedicated before session, Repositioned     PT Goals (current goals can  now be found in the care plan section) Acute Rehab PT Goals Patient  Stated Goal: none stated Progress towards PT goals: Progressing toward goals    Frequency    Min 1X/week       Co-evaluation     PT goals addressed during session: Mobility/safety with mobility;Balance;Proper use of DME;Strengthening/ROM        AM-PAC PT "6 Clicks" Mobility   Outcome Measure  Help needed turning from your back to your side while in a flat bed without using bedrails?: A Lot Help needed moving from lying on your back to sitting on the side of a flat bed without using bedrails?: A Lot Help needed moving to and from a bed to a chair (including a wheelchair)?: A Lot Help needed standing up from a chair using your arms (e.g., wheelchair or bedside chair)?: A Lot Help needed to walk in hospital room?: A Lot Help needed climbing 3-5 steps with a railing? : Total 6 Click Score: 11    End of Session   Activity Tolerance: Patient tolerated treatment well Patient left: in chair;with call bell/phone within reach;with chair alarm set Nurse Communication: Mobility status PT Visit Diagnosis: Unsteadiness on feet (R26.81);Muscle weakness (generalized) (M62.81)     Time: 4034-7425 PT Time Calculation (min) (ACUTE ONLY): 15 min  Charges:    $Therapeutic Activity: 8-22 mins PT General Charges $$ ACUTE PT VISIT: 1 Visit                    Jetta Lout PTA 08/23/23, 12:51 PM

## 2023-08-23 NOTE — TOC Progression Note (Addendum)
Transition of Care [facility]) - Progression Note    Patient Details  Name: [name] MRN: [id number] Date of Birth: 05-04-1941  [facility] CM/SW Contact  Hetty Ely, RN Phone Number: 08/23/2023, 11:40 AM  Clinical Narrative:  CM called [employer] for Insurance Auth, was informed that it was still pending, however Clinical Representative will call back for further information.  Spoke with Clinical Rep.Everleaner and Misty Stanley and advised to fax PT notes to [phone] and will follow up with me today or call Delilah in the morning.   Expected Discharge Plan: Skilled Nursing Facility Barriers to Discharge: SNF Pending bed offer, Insurance Authorization  Expected Discharge Plan and Services   Discharge Planning Services: CM Consult   Living arrangements for the past 2 months: Single Family Home                                       Social Determinants of Health (SDOH) Interventions SDOH Screenings   Food Insecurity: No Food Insecurity (08/14/2023)  Housing: Low Risk  (08/14/2023)  Transportation Needs: No Transportation Needs (08/14/2023)  Utilities: Not At Risk (08/14/2023)  Alcohol Screen: Low Risk  (03/14/2022)  Depression (PHQ2-9): Low Risk  (08/06/2023)  Tobacco Use: Medium Risk (08/14/2023)    Readmission Risk Interventions    06/29/2021    8:45 AM 05/27/2021    2:34 PM  Readmission Risk Prevention Plan  Transportation Screening Complete Complete  Medication Review (RN Care Manager) Complete Complete  PCP or Specialist appointment within 3-5 days of discharge Complete Complete  HRI or Home Care Consult Complete Complete  SW Recovery Care/Counseling Consult Complete Complete  Palliative Care Screening Not Applicable Complete  Skilled Nursing Facility Not Applicable Complete

## 2023-08-23 NOTE — Plan of Care (Signed)

## 2023-08-24 DIAGNOSIS — I480 Paroxysmal atrial fibrillation: Secondary | ICD-10-CM | POA: Diagnosis not present

## 2023-08-24 DIAGNOSIS — N184 Chronic kidney disease, stage 4 (severe): Secondary | ICD-10-CM | POA: Diagnosis not present

## 2023-08-24 DIAGNOSIS — Z7401 Bed confinement status: Secondary | ICD-10-CM | POA: Diagnosis not present

## 2023-08-24 DIAGNOSIS — S72143A Displaced intertrochanteric fracture of unspecified femur, initial encounter for closed fracture: Secondary | ICD-10-CM | POA: Diagnosis not present

## 2023-08-24 DIAGNOSIS — F0393 Unspecified dementia, unspecified severity, with mood disturbance: Secondary | ICD-10-CM | POA: Diagnosis not present

## 2023-08-24 DIAGNOSIS — D631 Anemia in chronic kidney disease: Secondary | ICD-10-CM | POA: Diagnosis not present

## 2023-08-24 DIAGNOSIS — S51811A Laceration without foreign body of right forearm, initial encounter: Secondary | ICD-10-CM | POA: Diagnosis not present

## 2023-08-24 DIAGNOSIS — I959 Hypotension, unspecified: Secondary | ICD-10-CM | POA: Diagnosis not present

## 2023-08-24 DIAGNOSIS — R319 Hematuria, unspecified: Secondary | ICD-10-CM | POA: Diagnosis not present

## 2023-08-24 DIAGNOSIS — E875 Hyperkalemia: Secondary | ICD-10-CM | POA: Diagnosis not present

## 2023-08-24 DIAGNOSIS — L308 Other specified dermatitis: Secondary | ICD-10-CM | POA: Diagnosis not present

## 2023-08-24 DIAGNOSIS — S72114D Nondisplaced fracture of greater trochanter of right femur, subsequent encounter for closed fracture with routine healing: Secondary | ICD-10-CM | POA: Diagnosis not present

## 2023-08-24 DIAGNOSIS — B029 Zoster without complications: Secondary | ICD-10-CM | POA: Diagnosis not present

## 2023-08-24 DIAGNOSIS — E1165 Type 2 diabetes mellitus with hyperglycemia: Secondary | ICD-10-CM

## 2023-08-24 DIAGNOSIS — N178 Other acute kidney failure: Secondary | ICD-10-CM | POA: Diagnosis not present

## 2023-08-24 DIAGNOSIS — S72114A Nondisplaced fracture of greater trochanter of right femur, initial encounter for closed fracture: Secondary | ICD-10-CM | POA: Diagnosis not present

## 2023-08-24 DIAGNOSIS — N179 Acute kidney failure, unspecified: Secondary | ICD-10-CM | POA: Diagnosis not present

## 2023-08-24 DIAGNOSIS — E1122 Type 2 diabetes mellitus with diabetic chronic kidney disease: Secondary | ICD-10-CM | POA: Diagnosis not present

## 2023-08-24 DIAGNOSIS — I509 Heart failure, unspecified: Secondary | ICD-10-CM | POA: Diagnosis not present

## 2023-08-24 DIAGNOSIS — I129 Hypertensive chronic kidney disease with stage 1 through stage 4 chronic kidney disease, or unspecified chronic kidney disease: Secondary | ICD-10-CM | POA: Diagnosis not present

## 2023-08-24 DIAGNOSIS — G8929 Other chronic pain: Secondary | ICD-10-CM | POA: Diagnosis not present

## 2023-08-24 DIAGNOSIS — I4891 Unspecified atrial fibrillation: Secondary | ICD-10-CM | POA: Diagnosis not present

## 2023-08-24 DIAGNOSIS — M25551 Pain in right hip: Secondary | ICD-10-CM | POA: Diagnosis not present

## 2023-08-24 LAB — URINALYSIS, W/ REFLEX TO CULTURE (INFECTION SUSPECTED)
Bilirubin Urine: NEGATIVE
Glucose, UA: NEGATIVE mg/dL
Ketones, ur: NEGATIVE mg/dL
Nitrite: POSITIVE — AB
Protein, ur: 100 mg/dL — AB
RBC / HPF: >50 RBC/hpf (ref 0–5)
Specific Gravity, Urine: 1.011 (ref 1.005–1.030)
WBC, UA: >50 WBC/hpf (ref 0–5)
pH: 5 (ref 5.0–8.0)

## 2023-08-24 LAB — GLUCOSE, CAPILLARY
Glucose-Capillary: 123 mg/dL — ABNORMAL HIGH (ref 70–99)
Glucose-Capillary: 166 mg/dL — ABNORMAL HIGH (ref 70–99)

## 2023-08-24 LAB — PROTIME-INR
INR: 2.5 — ABNORMAL HIGH (ref 0.8–1.2)
Prothrombin Time: 26.9 s — ABNORMAL HIGH (ref 11.4–15.2)

## 2023-08-24 MED ORDER — INSULIN GLARGINE 100 UNIT/ML SOLOSTAR PEN: 10.0000 [IU] | PEN_INJECTOR | Freq: Every day | SUBCUTANEOUS | 3 refills | Status: DC

## 2023-08-24 MED ORDER — OXYCODONE-ACETAMINOPHEN 5-325 MG PO TABS: 1.0000 | ORAL_TABLET | Freq: Four times a day (QID) | ORAL | 0 refills | Status: DC | PRN

## 2023-08-24 MED ORDER — CEPHALEXIN 250 MG PO CAPS: 250.0000 mg | ORAL_CAPSULE | Freq: Every day | ORAL | 0 refills | Status: AC

## 2023-08-24 MED ORDER — WARFARIN SODIUM 4 MG PO TABS
4.0000 mg | ORAL_TABLET | Freq: Once | ORAL | Status: DC
  Filled 2023-08-24: qty 1

## 2023-08-24 MED ORDER — WARFARIN SODIUM 2 MG PO TABS: 2.0000 mg | ORAL_TABLET | Freq: Every day | ORAL | 1 refills | Status: DC

## 2023-08-24 MED ORDER — LIDOCAINE 5 % EX PTCH: 1.0000 | MEDICATED_PATCH | CUTANEOUS | 0 refills | Status: DC

## 2023-08-24 NOTE — Progress Notes (Signed)
Contacted and reported to nurse Fleet Contras at [facility] transported pt from [facility] to [facility] at this time. Pt discharged with all personal belongings.Marland Kitchen

## 2023-08-24 NOTE — Consult Note (Signed)
[facility] Liaison Note  08/24/2023  [name] 1941/04/15 [id number]  Location: RN Hospital Liaison screened the patient remotely at [facility].  Insurance: [facility] HMO   [name] is a 82 y.o. female who is a Primary Care Patient of Sallyanne Kuster, NP -[facility]. The patient was screened for 30 day readmission hospitalization with noted extreme risk score for unplanned readmission risk with 3 IP/1 ED in 6 months.  The patient was assessed for potential Care Management service needs for post hospital transition for care coordination. Review of patient's electronic medical record reveals patient was admitted Fractured right femur. Pt discharged to SNF ([facility]) level of care for ongoing STR. Facility will continue to address pt's needs.    [facility] Care Management/Population Health does not replace or interfere with any arrangements made by the Inpatient Transition of Care team.   For questions contact:   Elliot Cousin, RN, [facility] Liaison [place]   [facility], Population Health Office Hours MTWF  8:00 am-6:00 pm Direct Dial: [phone] mobile [phone] [Office toll free line] Office Hours are M-F 8:30 - 5 pm [email or web address]

## 2023-08-24 NOTE — Progress Notes (Signed)
PHARMACY - ANTICOAGULATION CONSULT NOTE  Pharmacy Consult for warfarin Indication: atrial fibrillation  No Known Allergies  Patient Measurements: Height: 4\' 10"  (147.3 cm) Weight: 78.2 kg (172 lb 6.4 oz) IBW/kg (Calculated) : 40.9  Vital Signs: Temp: 97.9 F (36.6 C) (10/31 2312) Temp Source: Oral (10/31 2312) BP: 121/60 (10/31 2312) Pulse Rate: 88 (10/31 2312)  Labs: Recent Labs    08/21/23 0759 08/22/23 0528 08/23/23 0554 08/24/23 0255  HGB  --  8.9*  --   --   HCT  --  27.9*  --   --   PLT  --  253  --   --   LABPROT  --  33.1* 29.7* 26.9*  INR  --  3.2* 2.8* 2.5*  CREATININE 4.40* 3.65*  --   --     Estimated Creatinine Clearance: 10.5 mL/min (A) (by C-G formula based on SCr of 3.65 mg/dL (H)).   Medical History: Past Medical History:  Diagnosis Date   Anemia    Atrial fibrillation (HCC)    Chronic kidney disease    Congestive heart failure (CHF) (HCC)    COPD (chronic obstructive pulmonary disease) (HCC)    Diabetes mellitus without complication (HCC)    GERD (gastroesophageal reflux disease)    Hyperlipidemia    Hypertension    Pneumonia    Pulmonary fibrosis (HCC)    Medications:  Warfarin 2 mg Tues, Thurs, Sat, Sun Warfarin 4 mg Mon, Wed, Fri Total weekly warfarin: 20 mg Average daily dose ~3 mg Last dose at home prior to admission 10/21 - 4mg  Note from last admission mentions 4 mg on Mon, Wed and 2 mg all other days  Assessment: 82 year old female presented with fall and right hip pain. PMH atrial fibrillation on warfarin.  No clinically significant drug interactions noted.  Date INR Warfarin Dose  10/21 -- 4 mg (PTA)  10/22 1.7 3 mg  10/23 1.6 5 mg  10/24 2.0 2 mg   10/25 2.3 2 mg   10/26 2.6 2 mg  10/27 3.3 HOLD  10/28 3.9 HOLD  10/29 4.2 HOLD  10/30 3.2 HOLD  10/31 2.8 2 mg  11/01 2.5 4 mg   Goal of Therapy:  INR 2-3 Monitor platelets by anticoagulation protocol: Yes   Plan: INR is therapeutic this morning. Last CBC was  stable, no signs/symptoms of bleeding noted.  Give warfarin 4 mg x1 today Monitor INR daily while inpatient Monitor CBC and signs/symptoms of bleeding Of note, INR seemed to increase significantly after 5 mg dose on 10/23. Suspect reduction in discharge dosing will be necessary - appropriate discharge regimen for warfarin likely 2 mg daily.  Thank you for involving pharmacy in this patient's care.   Rockwell Alexandria, PharmD Clinical Pharmacist 08/24/2023 7:23 AM

## 2023-08-24 NOTE — Discharge Summary (Signed)
Physician Discharge Summary   Patient: [name] MRN: [id number] DOB: August 25, 1941  Admit date:     08/14/2023  Discharge date: 08/24/23  Discharge Physician: Enedina Finner   PCP: Sallyanne Kuster, NP   Recommendations at discharge:   follow-up PCP in 1 to 2 weeks follow-up Dr. Richardo Hanks urology for voiding trial on September 11, 2023 F/u Dr Audelia Acton in 2 weeks--Orthopedics Use right Abductor brace as per isntructions pharmacy to dose Coumadin and keep INR between two and three. Adjust dosing. Follow sugars and adjust insulin dosing.Tradjenta on hold follow-up nephrology Dr. Wynelle Link in 1 to 2 week  Discharge Diagnoses: Principal Problem:   Hip fracture [facility]) Active Problems:   Atrial fibrillation (HCC)   Paroxysmal A-fib (HCC)   Acute CHF (congestive heart failure) (HCC)   Acute renal failure superimposed on stage 4 chronic kidney disease (HCC)   Closed nondisplaced fracture of greater trochanter of right femur (HCC)  [name] is a 82 y.o. female with medical history significant of PAF on warfarin, off rate control medications, due to persistent bradycardia, COPD, CKD stage IV, IDDM, HTN, HLD, brought in by family member after a mechanical fall.   Patient fell on her right side and hit her right hip about sitting down on her bed and she missed the edge of the bed.  Excruciating pain and unable to stand on herself.  Denied any LOC, no head injury.    CT abdomen pelvis showed right greater trochanter fracture  CT head and neck negative for acute findings.    Acute right great trochanter nondisplaced fracture -Orthopedic surgery dr Ellene Route input appreciated--Conservative mnx with pain meds, abductor brace and PT/OT -Conservative management -Pain control   AKI on CKD IV with acute urinary retention presumed UTI --baseline creat 2.4--2.5 --creat trended upto 5.2--diuretics held--IVF--creat 3.6 --Pt will f/u with Dr Wynelle Link as outpt -- Foley catheter was removed. Patient  continues to retain urine. Place Foley catheter back in and patient will follow-up with urology Dr Richardo Hanks on nov 19th for voiding trial. Patient son and dter [name] is aware. --Kelfex for 7 days. UC and UA  Acute on chronic HFpEF decompensation -Likely secondary to uncontrolled A-fib -torsemide on hold due to creat rise -Monitor kidney function --sats >92% on RA   A-fib with RVR -As needed Lopressor -Continue Coumadin per pharmacy at the facility --INR 2.5   DM with stable sugars with CKD-IV -SSI for now --started Lantus 10 units daily--adjust according to sugars at facility --hold tradjenta     TOC for d/c planning PT to see--use abductor brace per ortho instructions         Procedures:none Family communication :Dter [name] on the phone Consults :ortho CODE STATUS: DNR DVT Prophylaxis :warfarin     Pain control - [place] Controlled Substance Reporting System database was reviewed. and patient was instructed, not to drive, operate heavy machinery, perform activities at heights, swimming or participation in water activities or provide baby-sitting services while on Pain, Sleep and Anxiety Medications; until their outpatient Physician has advised to do so again. Also recommended to not to take more than prescribed Pain, Sleep and Anxiety Medications.  Consultants: Ortho Procedures performed: none  Disposition: Rehabilitation facility Diet recommendation:  Cardiac and Carb modified diet DISCHARGE MEDICATION: Allergies as of 08/24/2023   No Known Allergies      Medication List     STOP taking these medications    linagliptin 5 MG Tabs tablet Commonly known as: Tradjenta   methocarbamol 500 MG  tablet Commonly known as: ROBAXIN   ondansetron 4 MG disintegrating tablet Commonly known as: ZOFRAN-ODT   torsemide 20 MG tablet Commonly known as: DEMADEX   traMADol 50 MG tablet Commonly known as: ULTRAM       TAKE these medications    albuterol 108 (90  Base) MCG/ACT inhaler Commonly known as: VENTOLIN HFA Inhale 2 puffs into the lungs every 6 (six) hours as needed for wheezing or shortness of breath.   atorvastatin 10 MG tablet Commonly known as: LIPITOR Take 1 tablet (10 mg total) by mouth every evening.   cephALEXin 250 MG capsule Commonly known as: KEFLEX Take 1 capsule (250 mg total) by mouth daily for 6 days. Start taking on: August 25, 2023   citalopram 20 MG tablet Commonly known as: CELEXA Take 1 tablet (20 mg total) by mouth daily.   cyanocobalamin 1000 MCG tablet Take 1 tablet (1,000 mcg total) by mouth daily.   donepezil 5 MG tablet Commonly known as: ARICEPT Take 1 tablet (5 mg total) by mouth every morning.   ferrous sulfate 325 (65 FE) MG tablet Take 1 tablet (325 mg total) by mouth daily with breakfast. What changed: when to take this   guaiFENesin 100 MG/5ML liquid Commonly known as: ROBITUSSIN Take 5 mLs by mouth every 4 (four) hours as needed for cough or to loosen phlegm.   insulin glargine 100 UNIT/ML Solostar Pen Commonly known as: LANTUS Inject 10 Units into the skin daily. What changed: how much to take   lidocaine 5 % Commonly known as: LIDODERM Place 1 patch onto the skin daily. Remove & Discard patch within 12 hours or as directed by MD Start taking on: August 25, 2023   linaclotide 145 MCG Caps capsule Commonly known as: Linzess Take 1 capsule (145 mcg total) by mouth at bedtime.   melatonin 5 MG Tabs Take 5 mg by mouth at bedtime as needed (sleep).   montelukast 10 MG tablet Commonly known as: SINGULAIR Take 1 tablet (10 mg total) by mouth at bedtime.   oxyCODONE-acetaminophen 5-325 MG tablet Commonly known as: PERCOCET/ROXICET Take 1 tablet by mouth every 6 (six) hours as needed for moderate pain (pain score 4-6) or severe pain (pain score 7-10). What changed:  when to take this reasons to take this Another medication with the same name was removed. Continue taking this  medication, and follow the directions you see here.   pantoprazole 40 MG tablet Commonly known as: PROTONIX Take 1 tablet (40 mg total) by mouth 2 (two) times daily.   Pen Needles 3/16" 31G X 5 MM Misc 1 applicator by Does not apply route 4 (four) times daily.   polyethylene glycol 17 g packet Commonly known as: MIRALAX / GLYCOLAX Take 17 g by mouth daily as needed for mild constipation.   promethazine-dextromethorphan 6.25-15 MG/5ML syrup Commonly known as: PROMETHAZINE-DM Take 5 mLs by mouth 4 (four) times daily as needed for cough.   rOPINIRole 0.5 MG tablet Commonly known as: REQUIP Take 1 tablet (0.5 mg total) by mouth every evening.   warfarin 2 MG tablet Commonly known as: COUMADIN Take 1 tablet (2 mg total) by mouth daily at 4 PM. Take 1 tab po Tuesday ,Thursday ,Friday ad Saturday and Sunday and then take 4 mg rest of week What changed:  how much to take how to take this when to take this Another medication with the same name was removed. Continue taking this medication, and follow the directions you see here.  Contact information for follow-up providers     Sondra Come, MD. Go on 09/11/2023.   Specialty: Urology Why: at  9:15 am and Sam at 4 pm Contact information: [address] [place] [address] [phone]         Reinaldo Berber, MD. Schedule an appointment as soon as possible for a visit in 1 week(s).   Specialty: Orthopedic Surgery Why: f/u right hip fracture Contact information: [address] [place] [address] [phone]              Contact information for after-discharge care     Destination     [facility] Preferred SNF .   Service: Skilled Nursing Contact information: [address] [place] [address] [phone]                    Discharge Exam: Ceasar Mons Weights   08/22/23 0500 08/23/23 0500 08/24/23 0500  Weight: 81.6 kg 77.6 kg 78.2  kg   GENERAL:  82 y.o.-year-old patient with no acute distress.  LUNGS: Normal breath sounds bilaterally, no wheezing CARDIOVASCULAR: S1, S2 normal. No murmur   ABDOMEN: Soft, nontender, nondistended. FOLEY+ EXTREMITIES: No  edema b/l.   Right abductor brace NEUROLOGIC: nonfocal  patient is alert and awake    Condition at discharge: fair  The results of significant diagnostics from this hospitalization (including imaging, microbiology, ancillary and laboratory) are listed below for reference.   Imaging Studies: US RENAL  Result Date: 08/20/2023 CLINICAL DATA:  Acute kidney insufficiency EXAM: RENAL / URINARY TRACT ULTRASOUND COMPLETE COMPARISON:  Ultrasound 02/06/2021.  CT scan 08/14/2023 FINDINGS: Right Kidney: Previous right nephrectomy. Left Kidney: Renal measurements: 11.4 x 6.1 x 5.0 cm = volume: 180.8 mL. Minimal fullness of the renal collecting system, unchanged from prior CT scan. No adjacent fluid. Bladder: Bladder is contracted. Other: None. IMPRESSION: Previous right nephrectomy. Slight fullness of the left renal collecting system, similar to the prior CT scan. Contracted urinary bladder. Electronically Signed   By: Karen Kays M.D.   On: 08/20/2023 12:07   ECHOCARDIOGRAM COMPLETE  Result Date: 08/15/2023    ECHOCARDIOGRAM REPORT   Patient Name:   KAELEE PFEFFER Date of Exam: 08/15/2023 Medical Rec #:  [id number]      Height:       58.0 in Accession #:    [id number]     Weight:       156.5 lb Date of Birth:  [birth year]      BSA:          1.641 m Patient Age:    82 years       BP:           105/54 mmHg Patient Gender: F              HR:           115 bpm. Exam Location:  [facility] Procedure: 2D Echo, Cardiac Doppler and Color Doppler Indications:     CHF  History:         Patient has no prior history of Echocardiogram examinations.                  CHF, COPD, Arrythmias:Atrial Fibrillation,                  Signs/Symptoms:Shortness of Breath and Chest Pain; Risk                   Factors:Hypertension,  Diabetes, Dyslipidemia and Current                  Smoker.  Sonographer:     Mikki Harbor Referring Phys:  [id number] Emeline General Diagnosing Phys: Julien Nordmann MD IMPRESSIONS  1. Left ventricular ejection fraction, by estimation, is 45 to 50%. Left ventricular ejection fraction by PLAX is 51 %. The left ventricle has mildly decreased function. The left ventricle has no regional wall motion abnormalities. There is mild left ventricular hypertrophy. Left ventricular diastolic parameters are indeterminate.  2. Right ventricular systolic function is normal. The right ventricular size is normal. There is mildly elevated pulmonary artery systolic pressure. The estimated right ventricular systolic pressure is 45.0 mmHg.  3. Left atrial size was mildly dilated.  4. The mitral valve is normal in structure. Mild mitral valve regurgitation. No evidence of mitral stenosis. Moderate mitral annular calcification.  5. Tricuspid valve regurgitation is mild to moderate.  6. The aortic valve is normal in structure. There is moderate calcification of the aortic valve. Aortic valve regurgitation is mild. Mild aortic valve stenosis. Aortic valve area, by VTI measures 1.17 cm. Aortic valve mean gradient measures 11.3 mmHg. Aortic valve Vmax measures 2.24 m/s. Visually, stenosis appears moderate.  7. The inferior vena cava is normal in size with greater than 50% respiratory variability, suggesting right atrial pressure of 3 mmHg. FINDINGS  Left Ventricle: Left ventricular ejection fraction, by estimation, is 45 to 50%. Left ventricular ejection fraction by PLAX is 51 %. The left ventricle has mildly decreased function. The left ventricle has no regional wall motion abnormalities. The left  ventricular internal cavity size was normal in size. There is mild left ventricular hypertrophy. Left ventricular diastolic parameters are indeterminate. Right Ventricle: The right ventricular size is normal. No increase in  right ventricular wall thickness. Right ventricular systolic function is normal. There is mildly elevated pulmonary artery systolic pressure. The tricuspid regurgitant velocity is 3.04  m/s, and with an assumed right atrial pressure of 8 mmHg, the estimated right ventricular systolic pressure is 45.0 mmHg. Left Atrium: Left atrial size was mildly dilated. Right Atrium: Right atrial size was normal in size. Pericardium: There is no evidence of pericardial effusion. Mitral Valve: The mitral valve is normal in structure. There is mild calcification of the mitral valve leaflet(s). Moderate mitral annular calcification. Mild mitral valve regurgitation. No evidence of mitral valve stenosis. MV peak gradient, 5.2 mmHg. The mean mitral valve gradient is 2.0 mmHg. Tricuspid Valve: The tricuspid valve is normal in structure. Tricuspid valve regurgitation is mild to moderate. No evidence of tricuspid stenosis. Aortic Valve: The aortic valve is normal in structure. There is moderate calcification of the aortic valve. Aortic valve regurgitation is mild. Mild aortic stenosis is present. Aortic valve mean gradient measures 11.3 mmHg. Aortic valve peak gradient measures 20.1 mmHg. Aortic valve area, by VTI measures 1.17 cm. Pulmonic Valve: The pulmonic valve was normal in structure. Pulmonic valve regurgitation is not visualized. No evidence of pulmonic stenosis. Aorta: The aortic root is normal in size and structure. Venous: The inferior vena cava is normal in size with greater than 50% respiratory variability, suggesting right atrial pressure of 3 mmHg. IAS/Shunts: No atrial level shunt detected by color flow Doppler.  LEFT VENTRICLE PLAX 2D LV EF:         Left ventricular ejection fraction by PLAX is 51 %. LVIDd:         3.90 cm LVIDs:  2.90 cm LV PW:         1.50 cm LV IVS:        1.40 cm LVOT diam:     2.00 cm LV SV:         56 LV SV Index:   34 LVOT Area:     3.14 cm  RIGHT VENTRICLE RV Basal diam:  3.00 cm RV Mid  diam:    2.50 cm RV S prime:     10.40 cm/s LEFT ATRIUM             Index        RIGHT ATRIUM           Index LA diam:        5.20 cm 3.17 cm/m   RA Area:     18.90 cm LA Vol (A2C):   73.9 ml 45.03 ml/m  RA Volume:   52.40 ml  31.93 ml/m LA Vol (A4C):   67.3 ml 41.01 ml/m LA Biplane Vol: 71.1 ml 43.33 ml/m  AORTIC VALVE                     PULMONIC VALVE AV Area (Vmax):    1.31 cm      PV Vmax:       1.01 m/s AV Area (Vmean):   1.18 cm      PV Peak grad:  4.1 mmHg AV Area (VTI):     1.17 cm AV Vmax:           224.33 cm/s AV Vmean:          156.333 cm/s AV VTI:            0.479 m AV Peak Grad:      20.1 mmHg AV Mean Grad:      11.3 mmHg LVOT Vmax:         93.80 cm/s LVOT Vmean:        58.700 cm/s LVOT VTI:          0.179 m LVOT/AV VTI ratio: 0.37  AORTA Ao Root diam: 2.50 cm Ao Asc diam:  2.80 cm MITRAL VALVE               TRICUSPID VALVE MV Area (PHT): 4.41 cm    TR Peak grad:   37.0 mmHg MV Area VTI:   3.33 cm    TR Vmax:        304.00 cm/s MV Peak grad:  5.2 mmHg MV Mean grad:  2.0 mmHg    SHUNTS MV Vmax:       1.14 m/s    Systemic VTI:  0.18 m MV Vmean:      60.5 cm/s   Systemic Diam: 2.00 cm MV Decel Time: 172 msec MV E velocity: 90.70 cm/s Julien Nordmann MD Electronically signed by Julien Nordmann MD Signature Date/Time: 08/15/2023/6:10:05 PM    Final    DG Chest Portable 1 View  Result Date: 08/14/2023 CLINICAL DATA:  Shortness of breath. EXAM: PORTABLE CHEST 1 VIEW COMPARISON:  Chest radiograph dated July 22, 2023. FINDINGS: The heart size and mediastinal contours are unchanged. Aortic calcification. Bilateral interstitial prominence with reticular opacities, consistent with patient's history of chronic interstitial lung disease. Possible trace left effusion. No pneumothorax. No acute osseous abnormality. IMPRESSION: Chronic interstitial lung disease with possible trace left pleural effusion. Electronically Signed   By: Hart Robinsons M.D.   On: 08/14/2023 13:24   CT ABDOMEN PELVIS WO  CONTRAST  Addendum  Date: 08/14/2023   ADDENDUM REPORT: 08/14/2023 10:22 ADDENDUM: On further review, there is an acute, nondisplaced fracture of the right greater trochanter (coronal image 67 series 6). These results were discussed by telephone at the time of interpretation on 08/14/2023 at 10:21 am with provider [facility] , who verbally acknowledged these results. Electronically Signed   By: Orvan Falconer M.D.   On: 08/14/2023 10:22   Result Date: 08/14/2023 CLINICAL DATA:  Abdominal trauma, blunt.  Right hip pain. EXAM: CT ABDOMEN AND PELVIS WITHOUT CONTRAST TECHNIQUE: Multidetector CT imaging of the abdomen and pelvis was performed following the standard protocol without IV contrast. RADIATION DOSE REDUCTION: This exam was performed according to the departmental dose-optimization program which includes automated exposure control, adjustment of the mA and/or kV according to patient size and/or use of iterative reconstruction technique. COMPARISON:  CT abdomen/pelvis 11/02/2022. FINDINGS: Lower chest: Mild interlobular septal thickening with associated patchy ground-glass opacities and trace bilateral pleural effusions, possibly reflecting mild pulmonary edema. Coronary artery calcifications. Hepatobiliary: No focal liver abnormality is seen. Status post cholecystectomy. No biliary dilatation. Pancreas: Unremarkable. No pancreatic ductal dilatation or surrounding inflammatory changes. Spleen: Normal in size without focal abnormality. Adrenals/Urinary Tract: Adrenal glands are unremarkable. Prior right nephrectomy. Unchanged dilation and dense calcifications of the mid and distal right ureter. Left kidney is unremarkable. No hydronephrosis or calculi. Bladder is unremarkable for degree of distention. Stomach/Bowel: Small hiatal hernia. No dilated loops of small bowel. Appendix is not visualized. Colon is unremarkable. No bowel wall thickening or surrounding inflammation. Vascular/Lymphatic: Aortic  atherosclerosis. No enlarged abdominal or pelvic lymph nodes. Reproductive: Status post hysterectomy. No adnexal masses. Other: Unchanged rectus diastasis and atrophy of the right abdominal musculature. Musculoskeletal: No acute or significant osseous findings. IMPRESSION: 1. No acute traumatic injury in the abdomen or pelvis. 2. Mild interlobular septal thickening with associated patchy ground-glass opacities and trace bilateral pleural effusions, possibly reflecting mild pulmonary edema. 3. Prior right nephrectomy. Unchanged dilation and dense calcifications of the mid and distal right ureter. Aortic Atherosclerosis (ICD10-I70.0). Electronically Signed: By: Orvan Falconer M.D. On: 08/14/2023 09:55   CT HEAD WO CONTRAST ( )  Result Date: 08/14/2023 CLINICAL DATA:  Neck trauma (Age >= 65y); Head trauma, minor (Age >= 65y). Fall. EXAM: CT HEAD WITHOUT CONTRAST CT CERVICAL SPINE WITHOUT CONTRAST TECHNIQUE: Multidetector CT imaging of the head and cervical spine was performed following the standard protocol without intravenous contrast. Multiplanar CT image reconstructions of the cervical spine were also generated. RADIATION DOSE REDUCTION: This exam was performed according to the departmental dose-optimization program which includes automated exposure control, adjustment of the mA and/or kV according to patient size and/or use of iterative reconstruction technique. COMPARISON:  CT head and cervical spine 11/30/2021. FINDINGS: CT HEAD FINDINGS Brain: No acute hemorrhage. Unchanged mild chronic small-vessel disease. Cortical gray-white differentiation is otherwise preserved. Prominence of the ventricles and sulci within expected range for age. No hydrocephalus or extra-axial collection. No mass effect or midline shift. Vascular: No hyperdense vessel or unexpected calcification. Skull: No calvarial fracture or suspicious bone lesion. Skull base is unremarkable. Sinuses/Orbits: No acute finding. Other: None. CT  CERVICAL SPINE FINDINGS Alignment: Unchanged trace anterolisthesis of C4 on C5. No traumatic malalignment. Skull base and vertebrae: No acute fracture. Normal craniocervical junction. No suspicious bone lesions. Soft tissues and spinal canal: No prevertebral fluid or swelling. No visible canal hematoma. Disc levels: Mild cervical spondylosis without high-grade spinal canal stenosis. Upper chest: No acute findings. Other: None. IMPRESSION: 1. No acute intracranial abnormality. 2.  No acute cervical spine fracture or traumatic malalignment. Electronically Signed   By: Orvan Falconer M.D.   On: 08/14/2023 09:48   CT Cervical Spine Wo Contrast  Result Date: 08/14/2023 CLINICAL DATA:  Neck trauma (Age >= 65y); Head trauma, minor (Age >= 65y). Fall. EXAM: CT HEAD WITHOUT CONTRAST CT CERVICAL SPINE WITHOUT CONTRAST TECHNIQUE: Multidetector CT imaging of the head and cervical spine was performed following the standard protocol without intravenous contrast. Multiplanar CT image reconstructions of the cervical spine were also generated. RADIATION DOSE REDUCTION: This exam was performed according to the departmental dose-optimization program which includes automated exposure control, adjustment of the mA and/or kV according to patient size and/or use of iterative reconstruction technique. COMPARISON:  CT head and cervical spine 11/30/2021. FINDINGS: CT HEAD FINDINGS Brain: No acute hemorrhage. Unchanged mild chronic small-vessel disease. Cortical gray-white differentiation is otherwise preserved. Prominence of the ventricles and sulci within expected range for age. No hydrocephalus or extra-axial collection. No mass effect or midline shift. Vascular: No hyperdense vessel or unexpected calcification. Skull: No calvarial fracture or suspicious bone lesion. Skull base is unremarkable. Sinuses/Orbits: No acute finding. Other: None. CT CERVICAL SPINE FINDINGS Alignment: Unchanged trace anterolisthesis of C4 on C5. No traumatic  malalignment. Skull base and vertebrae: No acute fracture. Normal craniocervical junction. No suspicious bone lesions. Soft tissues and spinal canal: No prevertebral fluid or swelling. No visible canal hematoma. Disc levels: Mild cervical spondylosis without high-grade spinal canal stenosis. Upper chest: No acute findings. Other: None. IMPRESSION: 1. No acute intracranial abnormality. 2. No acute cervical spine fracture or traumatic malalignment. Electronically Signed   By: Orvan Falconer M.D.   On: 08/14/2023 09:48   DG Pelvis 1-2 Views  Result Date: 08/14/2023 CLINICAL DATA:  Patient fell on floor getting into bed. Right hip pain. EXAM: PELVIS - 1-2 VIEW; RIGHT FEMUR 2 VIEWS COMPARISON:  CT abdomen and pelvis 01/11/2024a; contemporaneous CT abdomen and pelvis 08/14/2023 FINDINGS: There is diffuse decreased bone mineralization. Mild bilateral superomedial femoroacetabular joint space narrowing. Moderate pubic symphysis joint space narrowing and peripheral osteophytosis. Mild bilateral sacroiliac joint space narrowing with right-greater-than-left subchondral sclerosis. Moderate right knee medial knee compartment joint space narrowing. Minimal chronic enthesopathic change at the quadriceps insertion on the patella. No joint effusion. There is subtle lucency at the superior aspect of the right greater trochanter corresponding to the small nondisplaced acute fracture better seen on today's CT abdomen and pelvis. Moderate to severe atherosclerotic calcifications. IMPRESSION: 1. Subtle lucency at the superior aspect of the right greater trochanter corresponding to the small nondisplaced acute fracture better seen on today's CT abdomen and pelvis. 2. Mild bilateral femoroacetabular osteoarthritis. 3. Moderate medial right knee compartment osteoarthritis. Electronically Signed   By: Neita Garnet M.D.   On: 08/14/2023 09:39   DG FEMUR, MIN 2 VIEWS RIGHT  Result Date: 08/14/2023 CLINICAL DATA:  Patient fell on  floor getting into bed. Right hip pain. EXAM: PELVIS - 1-2 VIEW; RIGHT FEMUR 2 VIEWS COMPARISON:  CT abdomen and pelvis 01/11/2024a; contemporaneous CT abdomen and pelvis 08/14/2023 FINDINGS: There is diffuse decreased bone mineralization. Mild bilateral superomedial femoroacetabular joint space narrowing. Moderate pubic symphysis joint space narrowing and peripheral osteophytosis. Mild bilateral sacroiliac joint space narrowing with right-greater-than-left subchondral sclerosis. Moderate right knee medial knee compartment joint space narrowing. Minimal chronic enthesopathic change at the quadriceps insertion on the patella. No joint effusion. There is subtle lucency at the superior aspect of the right greater trochanter corresponding to the small nondisplaced acute fracture better seen  on today's CT abdomen and pelvis. Moderate to severe atherosclerotic calcifications. IMPRESSION: 1. Subtle lucency at the superior aspect of the right greater trochanter corresponding to the small nondisplaced acute fracture better seen on today's CT abdomen and pelvis. 2. Mild bilateral femoroacetabular osteoarthritis. 3. Moderate medial right knee compartment osteoarthritis. Electronically Signed   By: Neita Garnet M.D.   On: 08/14/2023 09:39    Microbiology: Results for orders placed or performed during the hospital encounter of 07/22/23  SARS Coronavirus 2 by RT PCR (hospital order, performed in [facility] hospital lab) *cepheid single result test* Anterior Nasal Swab     Status: None   Collection Time: 07/22/23 11:14 AM   Specimen: Anterior Nasal Swab  Result Value Ref Range Status   SARS Coronavirus 2 by RT PCR NEGATIVE NEGATIVE Final    Comment: (NOTE) SARS-CoV-2 target nucleic acids are NOT DETECTED.  The SARS-CoV-2 RNA is generally detectable in upper and lower respiratory specimens during the acute phase of infection. The lowest concentration of SARS-CoV-2 viral copies this assay can detect is 250 copies /  mL. A negative result does not preclude SARS-CoV-2 infection and should not be used as the sole basis for treatment or other patient management decisions.  A negative result may occur with improper specimen collection / handling, submission of specimen other than nasopharyngeal swab, presence of viral mutation(s) within the areas targeted by this assay, and inadequate number of viral copies (<250 copies / mL). A negative result must be combined with clinical observations, patient history, and epidemiological information.  Fact Sheet for Patients:   RoadLapTop.co.za  Fact Sheet for Healthcare Providers: http://kim-miller.com/  This test is not yet approved or  cleared by the [place] FDA and has been authorized for detection and/or diagnosis of SARS-CoV-2 by FDA under an Emergency Use Authorization (EUA).  This EUA will remain in effect (meaning this test can be used) for the duration of the COVID-19 declaration under Section 564(b)(1) of the Act, 21 U.S.C. section 360bbb-3(b)(1), unless the authorization is terminated or revoked sooner.  Performed at [facility], [address] [place], [place] [address]   Culture, blood (Routine X 2) w Reflex to ID Panel     Status: None   Collection Time: 07/22/23  2:05 PM   Specimen: BLOOD  Result Value Ref Range Status   Specimen Description BLOOD LEFT ANTECUBITAL  Final   Special Requests   Final    BOTTLES DRAWN AEROBIC AND ANAEROBIC Blood Culture adequate volume   Culture   Final    NO GROWTH 5 DAYS Performed at [facility], [address] [place], [place] [address]    Report Status 07/27/2023 FINAL  Final  Culture, blood (Routine X 2) w Reflex to ID Panel     Status: None   Collection Time: 07/22/23  2:05 PM   Specimen: BLOOD  Result Value Ref Range Status   Specimen Description BLOOD RIGHT ANTECUBITAL  Final   Special Requests   Final    BOTTLES DRAWN AEROBIC  ONLY Blood Culture results may not be optimal due to an inadequate volume of blood received in culture bottles   Culture   Final    NO GROWTH 5 DAYS Performed at [facility], [address] [place], [place] [address]    Report Status 07/27/2023 FINAL  Final    Labs: CBC: Recent Labs  Lab 08/19/23 0318 08/20/23 0442 08/21/23 0453 08/22/23 0528  WBC 12.8* 16.6* 10.0 7.2  NEUTROABS  --  14.9* 7.7 4.8  HGB 10.0* 10.7* 8.9* 8.9*  HCT 30.0* 33.2* 26.6* 27.9*  MCV 90.9 92.7 90.8 93.9  PLT 235 275 241 253   Basic Metabolic Panel: Recent Labs  Lab 08/20/23 0442 08/21/23 0759 08/22/23 0528  NA 131* 132* 135  K 4.2 4.0 4.5  CL 97* 101 103  CO2 20* 21* 24  GLUCOSE 133* 121* 113*  BUN 95* 89* 83*  CREATININE 5.21* 4.40* 3.65*  CALCIUM 8.2* 7.8* 8.2*  PHOS  --  4.8* 4.2   Liver Function Tests: Recent Labs  Lab 08/20/23 0442 08/21/23 0759 08/22/23 0528  AST 42*  --   --   ALT 13  --   --   ALKPHOS 80  --   --   BILITOT 1.1  --   --   PROT 7.2  --   --   ALBUMIN 2.8* 2.2* 2.2*   CBG: Recent Labs  Lab 08/23/23 1458 08/23/23 1627 08/23/23 2059 08/24/23 0731 08/24/23 1115  GLUCAP 146* 161* 146* 123* 166*    Discharge time spent: greater than 30 minutes.  Signed: Enedina Finner, MD [facility] 08/24/2023

## 2023-08-24 NOTE — Plan of Care (Signed)
Problem: Education: Goal: Ability to describe self-care measures that may prevent or decrease complications (Diabetes Survival Skills Education) will improve 08/24/2023 1353 by Oracio Galen Bet, LPN Outcome: Adequate for Discharge 08/24/2023 1353 by Khyron Garno Bet, LPN Outcome: Progressing Goal: Individualized Educational Video(s) 08/24/2023 1353 by Quade Ramirez Bet, LPN Outcome: Adequate for Discharge 08/24/2023 1353 by Angelito Hopping Bet, LPN Outcome: Progressing   Problem: Coping: Goal: Ability to adjust to condition or change in health will improve 08/24/2023 1353 by Rigo Letts Bet, LPN Outcome: Adequate for Discharge 08/24/2023 1353 by Greysen Swanton Bet, LPN Outcome: Progressing   Problem: Fluid Volume: Goal: Ability to maintain a balanced intake and output will improve 08/24/2023 1353 by Artemio Dobie Bet, LPN Outcome: Adequate for Discharge 08/24/2023 1353 by Lilian Fuhs Bet, LPN Outcome: Progressing   Problem: Health Behavior/Discharge Planning: Goal: Ability to identify and utilize available resources and services will improve 08/24/2023 1353 by Schyler Butikofer Bet, LPN Outcome: Adequate for Discharge 08/24/2023 1353 by Elleah Hemsley Bet, LPN Outcome: Progressing Goal: Ability to manage health-related needs will improve 08/24/2023 1353 by Lamone Ferrelli Bet, LPN Outcome: Adequate for Discharge 08/24/2023 1353 by Brileigh Sevcik Bet, LPN Outcome: Progressing   Problem: Metabolic: Goal: Ability to maintain appropriate glucose levels will improve 08/24/2023 1353 by Elon Eoff Bet, LPN Outcome: Adequate for Discharge 08/24/2023 1353 by Tenae Graziosi Bet, LPN Outcome: Progressing   Problem: Nutritional: Goal: Maintenance of adequate nutrition will improve 08/24/2023 1353 by Waldemar Siegel Bet, LPN Outcome: Adequate for Discharge 08/24/2023 1353 by Ester Hilley Bet, LPN Outcome: Progressing Goal: Progress toward achieving an optimal weight will improve 08/24/2023 1353 by Meldrick Buttery Bet, LPN Outcome: Adequate for  Discharge 08/24/2023 1353 by Khala Tarte Bet, LPN Outcome: Progressing   Problem: Skin Integrity: Goal: Risk for impaired skin integrity will decrease 08/24/2023 1353 by Lanier Felty Bet, LPN Outcome: Adequate for Discharge 08/24/2023 1353 by Shed Nixon Bet, LPN Outcome: Progressing   Problem: Tissue Perfusion: Goal: Adequacy of tissue perfusion will improve 08/24/2023 1353 by Jyssica Rief Bet, LPN Outcome: Adequate for Discharge 08/24/2023 1353 by Adithi Gammon Bet, LPN Outcome: Progressing   Problem: Education: Goal: Knowledge of General Education information will improve Description: Including pain rating scale, medication(s)/side effects and non-pharmacologic comfort measures 08/24/2023 1353 by Morrill Bomkamp Bet, LPN Outcome: Adequate for Discharge 08/24/2023 1353 by Chace Bisch Bet, LPN Outcome: Progressing   Problem: Health Behavior/Discharge Planning: Goal: Ability to manage health-related needs will improve 08/24/2023 1353 by Zarriah Starkel Bet, LPN Outcome: Adequate for Discharge 08/24/2023 1353 by Dahna Hattabaugh Bet, LPN Outcome: Progressing   Problem: Clinical Measurements: Goal: Ability to maintain clinical measurements within normal limits will improve 08/24/2023 1353 by Payton Moder Bet, LPN Outcome: Adequate for Discharge 08/24/2023 1353 by Mizuki Hoel Bet, LPN Outcome: Progressing Goal: Will remain free from infection 08/24/2023 1353 by Chasya Zenz Bet, LPN Outcome: Adequate for Discharge 08/24/2023 1353 by Estefanie Cornforth Bet, LPN Outcome: Progressing Goal: Diagnostic test results will improve 08/24/2023 1353 by Daizha Anand Bet, LPN Outcome: Adequate for Discharge 08/24/2023 1353 by Tucker Steedley Bet, LPN Outcome: Progressing Goal: Respiratory complications will improve 08/24/2023 1353 by Kamin Niblack Bet, LPN Outcome: Adequate for Discharge 08/24/2023 1353 by Tisheena Maguire Bet, LPN Outcome: Progressing Goal: Cardiovascular complication will be avoided 08/24/2023 1353 by Ashantia Amaral Bet, LPN Outcome:  Adequate for Discharge 08/24/2023 1353 by Bunny Kleist Bet, LPN Outcome: Progressing   Problem: Activity: Goal: Risk for activity intolerance will decrease 08/24/2023 1353 by Eesha Schmaltz Bet, LPN Outcome: Adequate for Discharge  08/24/2023 1353 by Jeymi Hepp Bet, LPN Outcome: Progressing   Problem: Nutrition: Goal: Adequate nutrition will be maintained 08/24/2023 1353 by Natoya Viscomi Bet, LPN Outcome: Adequate for Discharge 08/24/2023 1353 by Ravinder Hofland Bet, LPN Outcome: Progressing   Problem: Coping: Goal: Level of anxiety will decrease 08/24/2023 1353 by Makyi Ledo Bet, LPN Outcome: Adequate for Discharge 08/24/2023 1353 by Tahji [place] Bet, LPN Outcome: Progressing   Problem: Elimination: Goal: Will not experience complications related to bowel motility 08/24/2023 1353 by Wynelle Dreier Bet, LPN Outcome: Adequate for Discharge 08/24/2023 1353 by Mariena Meares Bet, LPN Outcome: Progressing Goal: Will not experience complications related to urinary retention 08/24/2023 1353 by Clinton Dragone Bet, LPN Outcome: Adequate for Discharge 08/24/2023 1353 by Tabytha Gradillas Bet, LPN Outcome: Progressing   Problem: Pain Management: Goal: General experience of comfort will improve 08/24/2023 1353 by Hanaa Payes Bet, LPN Outcome: Adequate for Discharge 08/24/2023 1353 by Willey Due Bet, LPN Outcome: Progressing   Problem: Safety: Goal: Ability to remain free from injury will improve 08/24/2023 1353 by Kyre Jeffries Bet, LPN Outcome: Adequate for Discharge 08/24/2023 1353 by Gene Glazebrook Bet, LPN Outcome: Progressing   Problem: Skin Integrity: Goal: Risk for impaired skin integrity will decrease 08/24/2023 1353 by Lakyn Mantione Bet, LPN Outcome: Adequate for Discharge 08/24/2023 1353 by Wheeler Incorvaia Bet, LPN Outcome: Progressing   Problem: Acute Rehab PT Goals(only PT should resolve) Goal: Pt Will Go Supine/Side To Sit Outcome: Adequate for Discharge Goal: Patient Will Transfer Sit To/From Stand Outcome: Adequate for  Discharge Goal: Pt Will Transfer Bed To Chair/Chair To Bed Outcome: Adequate for Discharge Goal: Pt Will Ambulate Outcome: Adequate for Discharge   Problem: Acute Rehab OT Goals (only OT should resolve) Goal: Pt. Will Perform Grooming Outcome: Adequate for Discharge Goal: Pt. Will Perform Upper Body Bathing Outcome: Adequate for Discharge Goal: Pt. Will Transfer To Toilet Outcome: Adequate for Discharge Goal: OT Additional ADL Goal #1 Outcome: Adequate for Discharge

## 2023-08-24 NOTE — TOC Progression Note (Addendum)
Transition of Care [facility]) - Progression Note    Patient Details  Name: [name] MRN: [id number] Date of Birth: [birth year]  [facility]) CM/SW Contact  Marlowe Sax, RN Phone Number: 08/24/2023, 11:49 AM  Clinical Narrative:     Spoke to the patient's daughter June and let her know that she will DC to [facility] today She is agreeable, I explained that EMS will transport I called EMS to transport to room 608B  Expected Discharge Plan: Skilled Nursing Facility Barriers to Discharge: SNF Pending bed offer, Insurance Authorization  Expected Discharge Plan and Services   Discharge Planning Services: CM Consult   Living arrangements for the past 2 months: Single Family Home                                       Social Determinants of Health (SDOH) Interventions SDOH Screenings   Food Insecurity: No Food Insecurity (08/14/2023)  Housing: Low Risk  (08/14/2023)  Transportation Needs: No Transportation Needs (08/14/2023)  Utilities: Not At Risk (08/14/2023)  Alcohol Screen: Low Risk  (03/14/2022)  Depression (PHQ2-9): Low Risk  (08/06/2023)  Tobacco Use: Medium Risk (08/14/2023)    Readmission Risk Interventions    06/29/2021    8:45 AM 05/27/2021    2:34 PM  Readmission Risk Prevention Plan  Transportation Screening Complete Complete  Medication Review (RN Care Manager) Complete Complete  PCP or Specialist appointment within 3-5 days of discharge Complete Complete  HRI or Home Care Consult Complete Complete  SW Recovery Care/Counseling Consult Complete Complete  Palliative Care Screening Not Applicable Complete  Skilled Nursing Facility Not Applicable Complete

## 2023-08-24 NOTE — Plan of Care (Signed)
  Problem: Health Behavior/Discharge Planning: Goal: Ability to manage health-related needs will improve Outcome: Progressing   Problem: Nutritional: Goal: Maintenance of adequate nutrition will improve Outcome: Progressing   Problem: Skin Integrity: Goal: Risk for impaired skin integrity will decrease Outcome: Progressing   Problem: Education: Goal: Knowledge of General Education information will improve Description: Including pain rating scale, medication(s)/side effects and non-pharmacologic comfort measures Outcome: Progressing

## 2023-08-24 NOTE — Discharge Instructions (Signed)
Keep log of blood sugars at home. Check PT/INR and adjust dosing of warfarin to keep INR between two and three

## 2023-08-27 ENCOUNTER — Telehealth: Payer: Self-pay | Admitting: Nurse Practitioner

## 2023-08-27 DIAGNOSIS — S72143A Displaced intertrochanteric fracture of unspecified femur, initial encounter for closed fracture: Secondary | ICD-10-CM | POA: Diagnosis not present

## 2023-08-27 NOTE — Telephone Encounter (Signed)
Lvm for June to schedule hospital follow up-Toni

## 2023-08-28 DIAGNOSIS — I4891 Unspecified atrial fibrillation: Secondary | ICD-10-CM | POA: Diagnosis not present

## 2023-08-30 DIAGNOSIS — I4891 Unspecified atrial fibrillation: Secondary | ICD-10-CM | POA: Diagnosis not present

## 2023-09-02 ENCOUNTER — Encounter: Payer: Self-pay | Admitting: Nurse Practitioner

## 2023-09-03 DIAGNOSIS — M25551 Pain in right hip: Secondary | ICD-10-CM | POA: Diagnosis not present

## 2023-09-03 DIAGNOSIS — S51811A Laceration without foreign body of right forearm, initial encounter: Secondary | ICD-10-CM | POA: Diagnosis not present

## 2023-09-03 DIAGNOSIS — L308 Other specified dermatitis: Secondary | ICD-10-CM | POA: Diagnosis not present

## 2023-09-03 DIAGNOSIS — G8929 Other chronic pain: Secondary | ICD-10-CM | POA: Diagnosis not present

## 2023-09-03 DIAGNOSIS — S72114A Nondisplaced fracture of greater trochanter of right femur, initial encounter for closed fracture: Secondary | ICD-10-CM | POA: Diagnosis not present

## 2023-09-04 ENCOUNTER — Telehealth: Payer: Self-pay

## 2023-09-04 DIAGNOSIS — B029 Zoster without complications: Secondary | ICD-10-CM | POA: Diagnosis not present

## 2023-09-05 DIAGNOSIS — E1122 Type 2 diabetes mellitus with diabetic chronic kidney disease: Secondary | ICD-10-CM | POA: Diagnosis not present

## 2023-09-05 DIAGNOSIS — N179 Acute kidney failure, unspecified: Secondary | ICD-10-CM | POA: Diagnosis not present

## 2023-09-05 DIAGNOSIS — E875 Hyperkalemia: Secondary | ICD-10-CM | POA: Diagnosis not present

## 2023-09-05 DIAGNOSIS — D631 Anemia in chronic kidney disease: Secondary | ICD-10-CM | POA: Diagnosis not present

## 2023-09-05 DIAGNOSIS — R319 Hematuria, unspecified: Secondary | ICD-10-CM | POA: Diagnosis not present

## 2023-09-05 DIAGNOSIS — I4891 Unspecified atrial fibrillation: Secondary | ICD-10-CM | POA: Diagnosis not present

## 2023-09-05 DIAGNOSIS — I509 Heart failure, unspecified: Secondary | ICD-10-CM | POA: Diagnosis not present

## 2023-09-05 DIAGNOSIS — N184 Chronic kidney disease, stage 4 (severe): Secondary | ICD-10-CM | POA: Diagnosis not present

## 2023-09-05 DIAGNOSIS — I129 Hypertensive chronic kidney disease with stage 1 through stage 4 chronic kidney disease, or unspecified chronic kidney disease: Secondary | ICD-10-CM | POA: Diagnosis not present

## 2023-09-06 ENCOUNTER — Other Ambulatory Visit: Payer: Medicare HMO

## 2023-09-06 ENCOUNTER — Ambulatory Visit: Payer: Medicare HMO

## 2023-09-06 ENCOUNTER — Telehealth: Payer: Self-pay

## 2023-09-06 ENCOUNTER — Inpatient Hospital Stay: Payer: Medicare HMO

## 2023-09-06 ENCOUNTER — Inpatient Hospital Stay: Payer: Medicare HMO | Attending: Oncology

## 2023-09-06 DIAGNOSIS — B029 Zoster without complications: Secondary | ICD-10-CM | POA: Diagnosis not present

## 2023-09-06 DIAGNOSIS — I4891 Unspecified atrial fibrillation: Secondary | ICD-10-CM | POA: Diagnosis not present

## 2023-09-06 NOTE — Telephone Encounter (Signed)
Left message for patient's daughter, June.

## 2023-09-07 DIAGNOSIS — I4891 Unspecified atrial fibrillation: Secondary | ICD-10-CM | POA: Diagnosis not present

## 2023-09-10 DIAGNOSIS — I4891 Unspecified atrial fibrillation: Secondary | ICD-10-CM | POA: Diagnosis not present

## 2023-09-10 DIAGNOSIS — S72114D Nondisplaced fracture of greater trochanter of right femur, subsequent encounter for closed fracture with routine healing: Secondary | ICD-10-CM | POA: Diagnosis not present

## 2023-09-10 DIAGNOSIS — D649 Anemia, unspecified: Secondary | ICD-10-CM | POA: Diagnosis not present

## 2023-09-10 DIAGNOSIS — R2681 Unsteadiness on feet: Secondary | ICD-10-CM | POA: Diagnosis not present

## 2023-09-10 DIAGNOSIS — E119 Type 2 diabetes mellitus without complications: Secondary | ICD-10-CM | POA: Diagnosis not present

## 2023-09-10 DIAGNOSIS — R1311 Dysphagia, oral phase: Secondary | ICD-10-CM | POA: Diagnosis not present

## 2023-09-10 DIAGNOSIS — R41841 Cognitive communication deficit: Secondary | ICD-10-CM | POA: Diagnosis not present

## 2023-09-10 DIAGNOSIS — Z741 Need for assistance with personal care: Secondary | ICD-10-CM | POA: Diagnosis not present

## 2023-09-10 DIAGNOSIS — M6259 Muscle wasting and atrophy, not elsewhere classified, multiple sites: Secondary | ICD-10-CM | POA: Diagnosis not present

## 2023-09-11 ENCOUNTER — Ambulatory Visit: Payer: Medicare HMO | Admitting: Urology

## 2023-09-11 ENCOUNTER — Encounter: Payer: Self-pay | Admitting: Urology

## 2023-09-11 ENCOUNTER — Ambulatory Visit: Payer: Medicare HMO | Admitting: Physician Assistant

## 2023-09-11 VITALS — BP 117/65 | HR 96 | Ht <= 58 in

## 2023-09-11 DIAGNOSIS — Z466 Encounter for fitting and adjustment of urinary device: Secondary | ICD-10-CM

## 2023-09-11 DIAGNOSIS — R339 Retention of urine, unspecified: Secondary | ICD-10-CM

## 2023-09-11 DIAGNOSIS — R41841 Cognitive communication deficit: Secondary | ICD-10-CM | POA: Diagnosis not present

## 2023-09-11 DIAGNOSIS — M6259 Muscle wasting and atrophy, not elsewhere classified, multiple sites: Secondary | ICD-10-CM | POA: Diagnosis not present

## 2023-09-11 DIAGNOSIS — I4891 Unspecified atrial fibrillation: Secondary | ICD-10-CM | POA: Diagnosis not present

## 2023-09-11 DIAGNOSIS — R1311 Dysphagia, oral phase: Secondary | ICD-10-CM | POA: Diagnosis not present

## 2023-09-11 DIAGNOSIS — R2681 Unsteadiness on feet: Secondary | ICD-10-CM | POA: Diagnosis not present

## 2023-09-11 DIAGNOSIS — Z741 Need for assistance with personal care: Secondary | ICD-10-CM | POA: Diagnosis not present

## 2023-09-11 DIAGNOSIS — S72114D Nondisplaced fracture of greater trochanter of right femur, subsequent encounter for closed fracture with routine healing: Secondary | ICD-10-CM | POA: Diagnosis not present

## 2023-09-11 LAB — BLADDER SCAN AMB NON-IMAGING: Scan Result: 109

## 2023-09-11 MED ORDER — NITROFURANTOIN MONOHYD MACRO 100 MG PO CAPS
100.0000 mg | ORAL_CAPSULE | Freq: Once | ORAL | Status: AC
  Administered 2023-09-11: 100 mg via ORAL

## 2023-09-11 NOTE — Progress Notes (Unsigned)
   09/11/23 9:17 AM   [name] May 09, 1941 [id number]  CC: Urinary retention, solitary kidney  HPI: 82 year old frail female here with her daughter today for urinary retention.  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  She was recently admitted from 08/14/2023-08/24/2023 for a hip fracture managed conservatively.  She was found to have AKI with creatinine of 5.2 from a baseline of 2.4, creatinine improved to 3.6 with hydration, ultimately catheter was placed(unknown PVR at that time).  She reportedly failed a voiding trial inpatient.  The history is obtained primarily from her daughter today.  She denies any urinary symptoms prior to that hospitalization including gross hematuria, dysuria, or incontinence.  She does not have problems with recurrent UTIs.  CT scan from admission shows solitary left kidney, no hydronephrosis or stones.   PMH: Past Medical History:  Diagnosis Date   Anemia    Atrial fibrillation (HCC)    Chronic kidney disease    Congestive heart failure (CHF) (HCC)    COPD (chronic obstructive pulmonary disease) (HCC)    Diabetes mellitus without complication (HCC)    GERD (gastroesophageal reflux disease)    Hyperlipidemia    Hypertension    Pneumonia    Pulmonary fibrosis (HCC)     Surgical History: Past Surgical History:  Procedure Laterality Date   ABDOMINAL HYSTERECTOMY     APPENDECTOMY     CATARACT EXTRACTION     CHOLECYSTECTOMY     HERNIA REPAIR     right knee replacement     right nephroectomy      Family History: Family History  Problem Relation Age of Onset   Diabetes Mother    Breast cancer Mother    Heart disease Father    Diabetes Father    Diabetes Sister    Diabetes Brother    Cancer Brother     Social History:  reports that she quit smoking about 12 months ago. Her smoking use included cigarettes. She has been exposed to tobacco smoke. She has never used  smokeless tobacco. She reports that she does not drink alcohol and does not use drugs.  Physical Exam: BP 117/65   Pulse 96   Ht 4\' 10"  (1.473 m)   BMI 36.03 kg/m    Constitutional: Frail-appearing, in wheelchair Cardiovascular: No clubbing, cyanosis, or edema. Respiratory: Normal respiratory effort, no increased work of breathing. GI: Abdomen is soft, nontender, nondistended, no abdominal masses Foley with cloudy yellow urine   Laboratory Data: Reviewed, see HPI  Pertinent Imaging: I have personally viewed and interpreted the CT scan dated 08/14/2023 showing a solitary left kidney with no hydronephrosis or stones, decompressed bladder.  Assessment & Plan:   Frail 82 year old female with a solitary left kidney who was recently admitted for hip fracture and found to be in urinary retention.  Baseline CKD with creatinine of 2.4.  She denies any urinary symptoms or incontinence prior to hospitalization.  She continues to take narcotics for hip pain.  I reviewed the hospitalization notes extensively.  Nitrofurantoin was given for prophylaxis and Foley catheter was removed this morning.  She returned this afternoon, and has been able to void, PVR  Follow up with urology as needed   Legrand Rams, MD 09/11/2023  [facility] [address], [address] [place], [place] [address] [phone]

## 2023-09-11 NOTE — Progress Notes (Unsigned)
Patient ID: [name], female   DOB: [birth year], 82 y.o.   MRN: [id number] Catheter Removal  Patient is present today for a catheter removal.  8ml of water was drained from the balloon. A 16FR foley cath was removed from the bladder, no complications were noted. Patient tolerated well.  Performed by: Mervin Hack, CMA  Follow up/ Additional notes:this afternoon at 3:30

## 2023-09-11 NOTE — Telephone Encounter (Signed)
Done

## 2023-09-11 NOTE — Progress Notes (Signed)
Patient returned to clinic this afternoon. Her daughter reports she had a wet diaper today. Bladder scan on arrival .   We gave them 1 more dose Macrobid 100mg  for UTI prevention; daughter prefers to administer this after they leave clinic along with food due to GI upset with the med this morning.  Patient may follow up PRN.

## 2023-09-12 DIAGNOSIS — I4891 Unspecified atrial fibrillation: Secondary | ICD-10-CM | POA: Diagnosis not present

## 2023-09-12 DIAGNOSIS — Z741 Need for assistance with personal care: Secondary | ICD-10-CM | POA: Diagnosis not present

## 2023-09-12 DIAGNOSIS — S72114D Nondisplaced fracture of greater trochanter of right femur, subsequent encounter for closed fracture with routine healing: Secondary | ICD-10-CM | POA: Diagnosis not present

## 2023-09-12 DIAGNOSIS — M6259 Muscle wasting and atrophy, not elsewhere classified, multiple sites: Secondary | ICD-10-CM | POA: Diagnosis not present

## 2023-09-12 DIAGNOSIS — R1311 Dysphagia, oral phase: Secondary | ICD-10-CM | POA: Diagnosis not present

## 2023-09-12 DIAGNOSIS — R2681 Unsteadiness on feet: Secondary | ICD-10-CM | POA: Diagnosis not present

## 2023-09-12 DIAGNOSIS — S01511A Laceration without foreign body of lip, initial encounter: Secondary | ICD-10-CM | POA: Diagnosis not present

## 2023-09-12 DIAGNOSIS — R41841 Cognitive communication deficit: Secondary | ICD-10-CM | POA: Diagnosis not present

## 2023-09-13 DIAGNOSIS — I4891 Unspecified atrial fibrillation: Secondary | ICD-10-CM | POA: Diagnosis not present

## 2023-09-13 DIAGNOSIS — Z741 Need for assistance with personal care: Secondary | ICD-10-CM | POA: Diagnosis not present

## 2023-09-13 DIAGNOSIS — R2681 Unsteadiness on feet: Secondary | ICD-10-CM | POA: Diagnosis not present

## 2023-09-13 DIAGNOSIS — S72114D Nondisplaced fracture of greater trochanter of right femur, subsequent encounter for closed fracture with routine healing: Secondary | ICD-10-CM | POA: Diagnosis not present

## 2023-09-13 DIAGNOSIS — R1311 Dysphagia, oral phase: Secondary | ICD-10-CM | POA: Diagnosis not present

## 2023-09-13 DIAGNOSIS — R41841 Cognitive communication deficit: Secondary | ICD-10-CM | POA: Diagnosis not present

## 2023-09-13 DIAGNOSIS — M6259 Muscle wasting and atrophy, not elsewhere classified, multiple sites: Secondary | ICD-10-CM | POA: Diagnosis not present

## 2023-09-14 DIAGNOSIS — I10 Essential (primary) hypertension: Secondary | ICD-10-CM | POA: Diagnosis not present

## 2023-09-19 DIAGNOSIS — N184 Chronic kidney disease, stage 4 (severe): Secondary | ICD-10-CM | POA: Diagnosis not present

## 2023-09-19 DIAGNOSIS — D631 Anemia in chronic kidney disease: Secondary | ICD-10-CM | POA: Diagnosis not present

## 2023-09-19 DIAGNOSIS — S72111D Displaced fracture of greater trochanter of right femur, subsequent encounter for closed fracture with routine healing: Secondary | ICD-10-CM | POA: Diagnosis not present

## 2023-09-19 DIAGNOSIS — E1122 Type 2 diabetes mellitus with diabetic chronic kidney disease: Secondary | ICD-10-CM | POA: Diagnosis not present

## 2023-09-19 DIAGNOSIS — I2510 Atherosclerotic heart disease of native coronary artery without angina pectoris: Secondary | ICD-10-CM | POA: Diagnosis not present

## 2023-09-19 DIAGNOSIS — E1165 Type 2 diabetes mellitus with hyperglycemia: Secondary | ICD-10-CM | POA: Diagnosis not present

## 2023-09-19 DIAGNOSIS — I130 Hypertensive heart and chronic kidney disease with heart failure and stage 1 through stage 4 chronic kidney disease, or unspecified chronic kidney disease: Secondary | ICD-10-CM | POA: Diagnosis not present

## 2023-09-19 DIAGNOSIS — I480 Paroxysmal atrial fibrillation: Secondary | ICD-10-CM | POA: Diagnosis not present

## 2023-09-19 DIAGNOSIS — I5033 Acute on chronic diastolic (congestive) heart failure: Secondary | ICD-10-CM | POA: Diagnosis not present

## 2023-09-21 DIAGNOSIS — I130 Hypertensive heart and chronic kidney disease with heart failure and stage 1 through stage 4 chronic kidney disease, or unspecified chronic kidney disease: Secondary | ICD-10-CM | POA: Diagnosis not present

## 2023-09-21 DIAGNOSIS — I5033 Acute on chronic diastolic (congestive) heart failure: Secondary | ICD-10-CM | POA: Diagnosis not present

## 2023-09-21 DIAGNOSIS — N184 Chronic kidney disease, stage 4 (severe): Secondary | ICD-10-CM | POA: Diagnosis not present

## 2023-09-21 DIAGNOSIS — S72111D Displaced fracture of greater trochanter of right femur, subsequent encounter for closed fracture with routine healing: Secondary | ICD-10-CM | POA: Diagnosis not present

## 2023-09-21 DIAGNOSIS — E1165 Type 2 diabetes mellitus with hyperglycemia: Secondary | ICD-10-CM | POA: Diagnosis not present

## 2023-09-21 DIAGNOSIS — E1122 Type 2 diabetes mellitus with diabetic chronic kidney disease: Secondary | ICD-10-CM | POA: Diagnosis not present

## 2023-09-21 DIAGNOSIS — I480 Paroxysmal atrial fibrillation: Secondary | ICD-10-CM | POA: Diagnosis not present

## 2023-09-21 DIAGNOSIS — I2510 Atherosclerotic heart disease of native coronary artery without angina pectoris: Secondary | ICD-10-CM | POA: Diagnosis not present

## 2023-09-21 DIAGNOSIS — D631 Anemia in chronic kidney disease: Secondary | ICD-10-CM | POA: Diagnosis not present

## 2023-09-25 ENCOUNTER — Telehealth (INDEPENDENT_AMBULATORY_CARE_PROVIDER_SITE_OTHER): Payer: Medicare HMO | Admitting: Nurse Practitioner

## 2023-09-25 ENCOUNTER — Encounter: Payer: Self-pay | Admitting: Nurse Practitioner

## 2023-09-25 VITALS — Resp 16 | Ht <= 58 in | Wt 152.0 lb

## 2023-09-25 DIAGNOSIS — I480 Paroxysmal atrial fibrillation: Secondary | ICD-10-CM | POA: Diagnosis not present

## 2023-09-25 DIAGNOSIS — I5033 Acute on chronic diastolic (congestive) heart failure: Secondary | ICD-10-CM | POA: Diagnosis not present

## 2023-09-25 DIAGNOSIS — E1165 Type 2 diabetes mellitus with hyperglycemia: Secondary | ICD-10-CM | POA: Diagnosis not present

## 2023-09-25 DIAGNOSIS — Z09 Encounter for follow-up examination after completed treatment for conditions other than malignant neoplasm: Secondary | ICD-10-CM

## 2023-09-25 DIAGNOSIS — Z794 Long term (current) use of insulin: Secondary | ICD-10-CM | POA: Diagnosis not present

## 2023-09-25 DIAGNOSIS — K5901 Slow transit constipation: Secondary | ICD-10-CM

## 2023-09-25 DIAGNOSIS — E1122 Type 2 diabetes mellitus with diabetic chronic kidney disease: Secondary | ICD-10-CM | POA: Diagnosis not present

## 2023-09-25 DIAGNOSIS — E1169 Type 2 diabetes mellitus with other specified complication: Secondary | ICD-10-CM | POA: Diagnosis not present

## 2023-09-25 DIAGNOSIS — I2510 Atherosclerotic heart disease of native coronary artery without angina pectoris: Secondary | ICD-10-CM | POA: Diagnosis not present

## 2023-09-25 DIAGNOSIS — I4891 Unspecified atrial fibrillation: Secondary | ICD-10-CM | POA: Diagnosis not present

## 2023-09-25 DIAGNOSIS — N184 Chronic kidney disease, stage 4 (severe): Secondary | ICD-10-CM | POA: Diagnosis not present

## 2023-09-25 DIAGNOSIS — S72114D Nondisplaced fracture of greater trochanter of right femur, subsequent encounter for closed fracture with routine healing: Secondary | ICD-10-CM

## 2023-09-25 DIAGNOSIS — S72111D Displaced fracture of greater trochanter of right femur, subsequent encounter for closed fracture with routine healing: Secondary | ICD-10-CM | POA: Diagnosis not present

## 2023-09-25 DIAGNOSIS — I130 Hypertensive heart and chronic kidney disease with heart failure and stage 1 through stage 4 chronic kidney disease, or unspecified chronic kidney disease: Secondary | ICD-10-CM | POA: Diagnosis not present

## 2023-09-25 DIAGNOSIS — D631 Anemia in chronic kidney disease: Secondary | ICD-10-CM | POA: Diagnosis not present

## 2023-09-25 MED ORDER — OXYCODONE-ACETAMINOPHEN 5-325 MG PO TABS
1.0000 | ORAL_TABLET | Freq: Four times a day (QID) | ORAL | 0 refills | Status: DC | PRN
Start: 2023-09-25 — End: 2023-10-08

## 2023-09-25 MED ORDER — AMLODIPINE BESYLATE 5 MG PO TABS: 5.0000 mg | ORAL_TABLET | Freq: Every day | ORAL | 1 refills | Status: DC

## 2023-09-25 MED ORDER — WARFARIN SODIUM 2 MG PO TABS: 2.0000 mg | ORAL_TABLET | Freq: Every day | ORAL | 1 refills | Status: DC

## 2023-09-25 MED ORDER — LINACLOTIDE 145 MCG PO CAPS: 145.0000 ug | ORAL_CAPSULE | Freq: Every day | ORAL | 1 refills | Status: DC

## 2023-09-25 NOTE — Progress Notes (Signed)
[facility] [address] [place], [place] [address]  Internal MEDICINE  Telephone Visit  Patient Name: [name]  [id number]  [id number]  Date of Service: 09/25/2023  I connected with the patient at 0945 by telephone and verified the patients identity using two identifiers.   I discussed the limitations, risks, security and privacy concerns of performing an evaluation and management service by telephone and the availability of in person appointments. I also discussed with the patient that there may be a patient responsible charge related to the service.  The patient expressed understanding and agrees to proceed.    Chief Complaint  Patient presents with   Telephone Screen    Follow up med refills    Telephone Assessment    HPI [name] presents for a telehealth virtual visit for hospital follow up for a fractured femur.  Fractured femur near right hip, was in the hospital for 3 weeks, then went to [facility] for rehab for a month and was recently discharged from rehab. Need refills of medications  Glucose readings have been stable in the low 100s  Still having a lot of pain, physical therapy is coming to the home to work with her.  Seeing Dr. Orlie Dakin for anemia and getting infusions.    Current Medication: Outpatient Encounter Medications as of 09/25/2023  Medication Sig   albuterol (VENTOLIN HFA) 108 (90 Base) MCG/ACT inhaler Inhale 2 puffs into the lungs every 6 (six) hours as needed for wheezing or shortness of breath.   atorvastatin (LIPITOR) 10 MG tablet Take 1 tablet (10 mg total) by mouth every evening.   citalopram (CELEXA) 20 MG tablet Take 1 tablet (20 mg total) by mouth daily.   donepezil (ARICEPT) 5 MG tablet Take 1 tablet (5 mg total) by mouth every morning.   ferrous sulfate 325 (65 FE) MG tablet Take 1 tablet (325 mg total) by mouth daily with breakfast.   insulin glargine (LANTUS) 100 UNIT/ML Solostar Pen Inject 10 Units into the skin daily.    Insulin Pen Needle (PEN NEEDLES 3/16") 31G X 5 MM MISC 1 applicator by Does not apply route 4 (four) times daily.   lidocaine (LIDODERM) 5 % Place 1 patch onto the skin daily. Remove & Discard patch within 12 hours or as directed by MD   melatonin 5 MG TABS Take 5 mg by mouth at bedtime as needed (sleep).   montelukast (SINGULAIR) 10 MG tablet Take 1 tablet (10 mg total) by mouth at bedtime.   pantoprazole (PROTONIX) 40 MG tablet Take 1 tablet (40 mg total) by mouth 2 (two) times daily.   polyethylene glycol (MIRALAX / GLYCOLAX) 17 g packet Take 17 g by mouth daily as needed for mild constipation.   rOPINIRole (REQUIP) 0.5 MG tablet Take 1 tablet (0.5 mg total) by mouth every evening.   vitamin B-12 1000 MCG tablet Take 1 tablet (1,000 mcg total) by mouth daily.   [DISCONTINUED] amLODipine (NORVASC) 5 MG tablet Take 5 mg by mouth daily.   [DISCONTINUED] guaiFENesin (ROBITUSSIN) 100 MG/5ML liquid Take 5 mLs by mouth every 4 (four) hours as needed for cough or to loosen phlegm.   [DISCONTINUED] linaclotide (LINZESS) 145 MCG CAPS capsule Take 1 capsule (145 mcg total) by mouth at bedtime.   [DISCONTINUED] oxyCODONE-acetaminophen (PERCOCET/ROXICET) 5-325 MG tablet Take 1 tablet by mouth every 6 (six) hours as needed for moderate pain (pain score 4-6) or severe pain (pain score 7-10).   [DISCONTINUED] promethazine-dextromethorphan (PROMETHAZINE-DM) 6.25-15 MG/5ML syrup Take 5  mLs by mouth 4 (four) times daily as needed for cough.   [DISCONTINUED] warfarin (COUMADIN) 2 MG tablet Take 1 tablet (2 mg total) by mouth daily at 4 PM. Take 1 tab po Tuesday ,Thursday ,Friday ad Saturday and Sunday and then take 4 mg rest of week   amLODipine (NORVASC) 5 MG tablet Take 1 tablet (5 mg total) by mouth daily.   linaclotide (LINZESS) 145 MCG CAPS capsule Take 1 capsule (145 mcg total) by mouth at bedtime.   oxyCODONE-acetaminophen (PERCOCET/ROXICET) 5-325 MG tablet Take 1 tablet by mouth every 6 (six) hours as needed  for moderate pain (pain score 4-6) or severe pain (pain score 7-10).   warfarin (COUMADIN) 2 MG tablet Take 1 tablet (2 mg total) by mouth daily at 4 PM. Take 1 tab po Tuesday ,Thursday ,Friday ad Saturday and Sunday and then take 4 mg rest of week   No facility-administered encounter medications on file as of 09/25/2023.    Surgical History: Past Surgical History:  Procedure Laterality Date   ABDOMINAL HYSTERECTOMY     APPENDECTOMY     CATARACT EXTRACTION     CHOLECYSTECTOMY     HERNIA REPAIR     right knee replacement     right nephroectomy      Medical History: Past Medical History:  Diagnosis Date   Anemia    Atrial fibrillation (HCC)    Chronic kidney disease    Congestive heart failure (CHF) (HCC)    COPD (chronic obstructive pulmonary disease) (HCC)    Diabetes mellitus without complication (HCC)    GERD (gastroesophageal reflux disease)    Hyperlipidemia    Hypertension    Pneumonia    Pulmonary fibrosis (HCC)     Family History: Family History  Problem Relation Age of Onset   Diabetes Mother    Breast cancer Mother    Heart disease Father    Diabetes Father    Diabetes Sister    Diabetes Brother    Cancer Brother     Social History   Socioeconomic History   Marital status: Widowed    Spouse name: Not on file   Number of children: Not on file   Years of education: Not on file   Highest education level: Not on file  Occupational History   Occupation: retired  Tobacco Use   Smoking status: Former    Current packs/day: 0.00    Types: Cigarettes    Quit date: 08/19/2022    Years since quitting: 1.1    Passive exposure: Past   Smokeless tobacco: Never   Tobacco comments:    1 pack daily---quit 1 month   Vaping Use   Vaping status: Never Used  Substance and Sexual Activity   Alcohol use: No   Drug use: No   Sexual activity: Not Currently  Other Topics Concern   Not on file  Social History Narrative   Lives with daughter   Social  Determinants of Health   Financial Resource Strain: Not on file  Food Insecurity: No Food Insecurity (08/14/2023)   Hunger Vital Sign    Worried About Running Out of Food in the Last Year: Never true    Ran Out of Food in the Last Year: Never true  Transportation Needs: No Transportation Needs (08/14/2023)   PRAPARE - Administrator, Civil Service (Medical): No    Lack of Transportation (Non-Medical): No  Physical Activity: Not on file  Stress: Not on file  Social Connections: Not on  file  Intimate Partner Violence: Not At Risk (08/14/2023)   Humiliation, Afraid, Rape, and Kick questionnaire    Fear of Current or Ex-Partner: No    Emotionally Abused: No    Physically Abused: No    Sexually Abused: No      Review of Systems  Constitutional:  Positive for fatigue.  HENT: Negative.    Respiratory: Negative.  Negative for cough, chest tightness, shortness of breath and wheezing.   Cardiovascular: Negative.  Negative for chest pain and palpitations.  Gastrointestinal: Negative.   Genitourinary: Negative.   Musculoskeletal:  Positive for arthralgias (esp right hip and right leg), back pain and gait problem.  Neurological:  Positive for weakness.    Vital Signs: Resp 16   Ht 4\' 5"  (1.346 m)   Wt 152 lb (68.9 kg)   BMI 38.04 kg/m    Observation/Objective: She is alert. No acute distress noted.     Assessment/Plan: 1. Hospital discharge follow-up Treated in hospital for fracture. Refills ordered for amlodipine.  - amLODipine (NORVASC) 5 MG tablet; Take 1 tablet (5 mg total) by mouth daily.  Dispense: 90 tablet; Refill: 1  2. Closed nondisplaced fracture of greater trochanter of right femur with routine healing, subsequent encounter Continue prn percocet as prescribe.d  - oxyCODONE-acetaminophen (PERCOCET/ROXICET) 5-325 MG tablet; Take 1 tablet by mouth every 6 (six) hours as needed for moderate pain (pain score 4-6) or severe pain (pain score 7-10).  Dispense:  120 tablet; Refill: 0  3. Type 2 diabetes mellitus with other specified complication, with long-term current use of insulin (HCC) Continue checking glucose as instructed. Continue lantus insulin as prescribed.   4. Atrial fibrillation, unspecified type (HCC) Continue warfarin as prescribed.  - warfarin (COUMADIN) 2 MG tablet; Take 1 tablet (2 mg total) by mouth daily at 4 PM. Take 1 tab po Tuesday ,Thursday ,Friday ad Saturday and Sunday and then take 4 mg rest of week  Dispense: 90 tablet; Refill: 1  5. Constipation by delayed colonic transit Continue linzess as prescribed  - linaclotide (LINZESS) 145 MCG CAPS capsule; Take 1 capsule (145 mcg total) by mouth at bedtime.  Dispense: 90 capsule; Refill: 1   General Counseling: [name] verbalizes understanding of the findings of today's phone visit and agrees with plan of treatment. I have discussed any further diagnostic evaluation that may be needed or ordered today. We also reviewed her medications today. she has been encouraged to call the office with any questions or concerns that should arise related to todays visit.  Return in about 4 weeks (around 10/23/2023) for F/U, Keante Urizar PCP reevaluate percocet script and dose. .   No orders of the defined types were placed in this encounter.   Meds ordered this encounter  Medications   amLODipine (NORVASC) 5 MG tablet    Sig: Take 1 tablet (5 mg total) by mouth daily.    Dispense:  90 tablet    Refill:  1   linaclotide (LINZESS) 145 MCG CAPS capsule    Sig: Take 1 capsule (145 mcg total) by mouth at bedtime.    Dispense:  90 capsule    Refill:  1   warfarin (COUMADIN) 2 MG tablet    Sig: Take 1 tablet (2 mg total) by mouth daily at 4 PM. Take 1 tab po Tuesday ,Thursday ,Friday ad Saturday and Sunday and then take 4 mg rest of week    Dispense:  90 tablet    Refill:  1   oxyCODONE-acetaminophen (PERCOCET/ROXICET) 5-325 MG  tablet    Sig: Take 1 tablet by mouth every 6 (six) hours as needed  for moderate pain (pain score 4-6) or severe pain (pain score 7-10).    Dispense:  120 tablet    Refill:  0    This is a Refill from previous prescription    Time spent:30 Minutes Time spent with patient included reviewing progress notes, labs, imaging studies, and discussing plan for follow up.  [place] Controlled Substance Database was reviewed by me for overdose risk score (ORS) if appropriate.  This patient was seen by Sallyanne Kuster, FNP-C in collaboration with Dr. Beverely Risen as a part of collaborative care agreement.  Dawne Casali R. Tedd Sias, MSN, FNP-C Internal medicine

## 2023-09-26 ENCOUNTER — Telehealth: Payer: Self-pay | Admitting: Nurse Practitioner

## 2023-09-26 NOTE — Telephone Encounter (Signed)
Received 09/19/23 order from [facility]. Gave to AA for signatures-Toni

## 2023-09-27 ENCOUNTER — Telehealth: Payer: Self-pay | Admitting: Nurse Practitioner

## 2023-09-27 NOTE — Telephone Encounter (Signed)
09/19/23 order faxed back to [facility]; [phone] & [phone].Scanned-Toni

## 2023-09-28 ENCOUNTER — Telehealth: Payer: Self-pay | Admitting: Nurse Practitioner

## 2023-09-28 ENCOUNTER — Emergency Department
Admission: EM | Admit: 2023-09-28 | Discharge: 2023-09-28 | Discharge status: Against Medical Advice | Payer: Medicare HMO | Source: Home / Self Care

## 2023-09-28 ENCOUNTER — Telehealth: Payer: Self-pay | Admitting: Emergency Medicine

## 2023-09-28 ENCOUNTER — Other Ambulatory Visit: Payer: Self-pay

## 2023-09-28 ENCOUNTER — Emergency Department: Payer: Medicare HMO

## 2023-09-28 ENCOUNTER — Encounter: Payer: Self-pay | Admitting: Emergency Medicine

## 2023-09-28 DIAGNOSIS — W19XXXA Unspecified fall, initial encounter: Secondary | ICD-10-CM | POA: Diagnosis not present

## 2023-09-28 DIAGNOSIS — A419 Sepsis, unspecified organism: Secondary | ICD-10-CM | POA: Diagnosis present

## 2023-09-28 DIAGNOSIS — D631 Anemia in chronic kidney disease: Secondary | ICD-10-CM | POA: Diagnosis present

## 2023-09-28 DIAGNOSIS — R0689 Other abnormalities of breathing: Secondary | ICD-10-CM | POA: Diagnosis not present

## 2023-09-28 DIAGNOSIS — D689 Coagulation defect, unspecified: Secondary | ICD-10-CM | POA: Diagnosis present

## 2023-09-28 DIAGNOSIS — K838 Other specified diseases of biliary tract: Secondary | ICD-10-CM | POA: Diagnosis not present

## 2023-09-28 DIAGNOSIS — E440 Moderate protein-calorie malnutrition: Secondary | ICD-10-CM | POA: Diagnosis present

## 2023-09-28 DIAGNOSIS — Z6831 Body mass index (BMI) 31.0-31.9, adult: Secondary | ICD-10-CM | POA: Diagnosis not present

## 2023-09-28 DIAGNOSIS — R531 Weakness: Secondary | ICD-10-CM | POA: Insufficient documentation

## 2023-09-28 DIAGNOSIS — N3289 Other specified disorders of bladder: Secondary | ICD-10-CM | POA: Diagnosis not present

## 2023-09-28 DIAGNOSIS — N189 Chronic kidney disease, unspecified: Secondary | ICD-10-CM | POA: Diagnosis not present

## 2023-09-28 DIAGNOSIS — E1165 Type 2 diabetes mellitus with hyperglycemia: Secondary | ICD-10-CM | POA: Diagnosis not present

## 2023-09-28 DIAGNOSIS — R6521 Severe sepsis with septic shock: Secondary | ICD-10-CM | POA: Diagnosis not present

## 2023-09-28 DIAGNOSIS — S72114A Nondisplaced fracture of greater trochanter of right femur, initial encounter for closed fracture: Secondary | ICD-10-CM | POA: Diagnosis present

## 2023-09-28 DIAGNOSIS — N179 Acute kidney failure, unspecified: Secondary | ICD-10-CM | POA: Diagnosis present

## 2023-09-28 DIAGNOSIS — I959 Hypotension, unspecified: Secondary | ICD-10-CM | POA: Diagnosis not present

## 2023-09-28 DIAGNOSIS — Z5321 Procedure and treatment not carried out due to patient leaving prior to being seen by health care provider: Secondary | ICD-10-CM | POA: Insufficient documentation

## 2023-09-28 DIAGNOSIS — J449 Chronic obstructive pulmonary disease, unspecified: Secondary | ICD-10-CM | POA: Diagnosis present

## 2023-09-28 DIAGNOSIS — E8721 Acute metabolic acidosis: Secondary | ICD-10-CM | POA: Diagnosis present

## 2023-09-28 DIAGNOSIS — D62 Acute posthemorrhagic anemia: Secondary | ICD-10-CM | POA: Diagnosis not present

## 2023-09-28 DIAGNOSIS — Z905 Acquired absence of kidney: Secondary | ICD-10-CM | POA: Diagnosis not present

## 2023-09-28 DIAGNOSIS — I4891 Unspecified atrial fibrillation: Secondary | ICD-10-CM | POA: Diagnosis not present

## 2023-09-28 DIAGNOSIS — I130 Hypertensive heart and chronic kidney disease with heart failure and stage 1 through stage 4 chronic kidney disease, or unspecified chronic kidney disease: Secondary | ICD-10-CM | POA: Diagnosis present

## 2023-09-28 DIAGNOSIS — N184 Chronic kidney disease, stage 4 (severe): Secondary | ICD-10-CM | POA: Diagnosis present

## 2023-09-28 DIAGNOSIS — R0602 Shortness of breath: Secondary | ICD-10-CM | POA: Diagnosis not present

## 2023-09-28 DIAGNOSIS — Z66 Do not resuscitate: Secondary | ICD-10-CM | POA: Diagnosis present

## 2023-09-28 DIAGNOSIS — E871 Hypo-osmolality and hyponatremia: Secondary | ICD-10-CM | POA: Diagnosis present

## 2023-09-28 DIAGNOSIS — S72111D Displaced fracture of greater trochanter of right femur, subsequent encounter for closed fracture with routine healing: Secondary | ICD-10-CM | POA: Diagnosis not present

## 2023-09-28 DIAGNOSIS — I5033 Acute on chronic diastolic (congestive) heart failure: Secondary | ICD-10-CM | POA: Diagnosis not present

## 2023-09-28 DIAGNOSIS — I480 Paroxysmal atrial fibrillation: Secondary | ICD-10-CM | POA: Diagnosis present

## 2023-09-28 DIAGNOSIS — K449 Diaphragmatic hernia without obstruction or gangrene: Secondary | ICD-10-CM | POA: Diagnosis not present

## 2023-09-28 DIAGNOSIS — I2510 Atherosclerotic heart disease of native coronary artery without angina pectoris: Secondary | ICD-10-CM | POA: Diagnosis not present

## 2023-09-28 DIAGNOSIS — K921 Melena: Secondary | ICD-10-CM | POA: Diagnosis present

## 2023-09-28 DIAGNOSIS — L89312 Pressure ulcer of right buttock, stage 2: Secondary | ICD-10-CM | POA: Diagnosis present

## 2023-09-28 DIAGNOSIS — G9341 Metabolic encephalopathy: Secondary | ICD-10-CM | POA: Diagnosis present

## 2023-09-28 DIAGNOSIS — R109 Unspecified abdominal pain: Secondary | ICD-10-CM | POA: Diagnosis not present

## 2023-09-28 DIAGNOSIS — N390 Urinary tract infection, site not specified: Secondary | ICD-10-CM | POA: Diagnosis present

## 2023-09-28 DIAGNOSIS — R300 Dysuria: Secondary | ICD-10-CM | POA: Insufficient documentation

## 2023-09-28 DIAGNOSIS — I700 Atherosclerosis of aorta: Secondary | ICD-10-CM | POA: Diagnosis not present

## 2023-09-28 DIAGNOSIS — I5043 Acute on chronic combined systolic (congestive) and diastolic (congestive) heart failure: Secondary | ICD-10-CM | POA: Diagnosis present

## 2023-09-28 DIAGNOSIS — Z1611 Resistance to penicillins: Secondary | ICD-10-CM | POA: Diagnosis present

## 2023-09-28 DIAGNOSIS — R6520 Severe sepsis without septic shock: Secondary | ICD-10-CM | POA: Diagnosis not present

## 2023-09-28 DIAGNOSIS — Z515 Encounter for palliative care: Secondary | ICD-10-CM | POA: Diagnosis not present

## 2023-09-28 DIAGNOSIS — E1122 Type 2 diabetes mellitus with diabetic chronic kidney disease: Secondary | ICD-10-CM | POA: Diagnosis present

## 2023-09-28 DIAGNOSIS — Z7401 Bed confinement status: Secondary | ICD-10-CM | POA: Diagnosis not present

## 2023-09-28 DIAGNOSIS — R000 Tachycardia, unspecified: Secondary | ICD-10-CM | POA: Diagnosis not present

## 2023-09-28 LAB — CBC
HCT: 34.8 % — ABNORMAL LOW (ref 36.0–46.0)
Hemoglobin: 11.5 g/dL — ABNORMAL LOW (ref 12.0–15.0)
MCH: 28.5 pg (ref 26.0–34.0)
MCHC: 33 g/dL (ref 30.0–36.0)
MCV: 86.4 fL (ref 80.0–100.0)
Platelets: 371 10*3/uL (ref 150–400)
RBC: 4.03 MIL/uL (ref 3.87–5.11)
RDW: 16 % — ABNORMAL HIGH (ref 11.5–15.5)
WBC: 18.2 10*3/uL — ABNORMAL HIGH (ref 4.0–10.5)
nRBC: 0 % (ref 0.0–0.2)

## 2023-09-28 LAB — BASIC METABOLIC PANEL
Anion gap: 14 (ref 5–15)
BUN: 109 mg/dL — ABNORMAL HIGH (ref 8–23)
CO2: 20 mmol/L — ABNORMAL LOW (ref 22–32)
Calcium: 8 mg/dL — ABNORMAL LOW (ref 8.9–10.3)
Chloride: 96 mmol/L — ABNORMAL LOW (ref 98–111)
Creatinine, Ser: 8.51 mg/dL — ABNORMAL HIGH (ref 0.44–1.00)
GFR, Estimated: 4 mL/min — ABNORMAL LOW (ref >60)
Glucose, Bld: 118 mg/dL — ABNORMAL HIGH (ref 70–99)
Potassium: 3.7 mmol/L (ref 3.5–5.1)
Sodium: 130 mmol/L — ABNORMAL LOW (ref 135–145)

## 2023-09-28 NOTE — ED Triage Notes (Signed)
Pt comes via EMS home with c/o weakness and pain with urination. Pt has kidney failure and only 1 kidney. Pt states she was in rehab for fall two weeks ago with fracture. Pt had cath taken out prior to release. Urine is dark colored and odor.   VSS

## 2023-09-28 NOTE — Telephone Encounter (Signed)
Received call from [facility] with [facility] stating patient had urine in blood. Per provider, patient to go to ED due to her BP and can possibly get sepsis. Lvm for Kendal Hymen, called house #, gentleman stated daughter, June, was at the ED with patient-Toni

## 2023-09-28 NOTE — Telephone Encounter (Signed)
This RN received phone call for patient's daughter, this RN explained that patient needed to return to ED for further evaluation due to changes in BUN/Creatinine. Pt's daughter states understanding in importance in returning for evaluation/follow up.

## 2023-09-28 NOTE — ED Triage Notes (Signed)
Patient to ED via [facility] from home for generalized weakness x4 days. Family states urine is dark in color. Was recently in rehab with a catheter but has been home and it out since the 21st. C/o abd pain.

## 2023-09-28 NOTE — Telephone Encounter (Signed)
Attempted to call both numbers listed for patient's daugher/emergency contact/ and HCPOA. No answer. Call patient's cell phone number listed in chart, spoke with family member. Have not been able to get in touch with daughter or patient. Spoke with EDP Fuller Plan regarding patient and per Dr. Fuller Plan due to patient's BUN/Creatinine changes patient needs to come back for possible admission.

## 2023-09-28 NOTE — ED Provider Triage Note (Signed)
Emergency Medicine Provider Triage Evaluation Note  AKAILAH BESECKER , a 82 y.o. female  was evaluated in triage.  Pt complains of weakness. Patient also reports dark urine. Patient was recently at rehab center and had a urinary catheter.   Review of Systems  Positive: weakness Negative: SOB  Physical Exam  BP (!) 113/57   Pulse (!) 103   Temp 97.7 F (36.5 C) (Oral)   Resp 18   Ht 4\' 10"  (1.473 m)   Wt 68.9 kg   SpO2 97%   BMI 31.77 kg/m  Gen:   Awake, no distress   Resp:  Normal effort  MSK:   Moves extremities without difficulty  Other:    Medical Decision Making  Medically screening exam initiated at 5:29 PM.  Appropriate orders placed.  [name] was informed that the remainder of the evaluation will be completed by another provider, this initial triage assessment does not replace that evaluation, and the importance of remaining in the ED until their evaluation is complete.     Cameron Ali, PA-C 09/28/23 1732

## 2023-09-29 ENCOUNTER — Emergency Department: Payer: Medicare HMO

## 2023-09-29 ENCOUNTER — Other Ambulatory Visit: Payer: Self-pay

## 2023-09-29 ENCOUNTER — Inpatient Hospital Stay
Admission: EM | Admit: 2023-09-29 | Discharge: 2023-10-08 | DRG: 871 | Disposition: A | Discharge status: Hospice | Payer: Medicare HMO | Attending: Internal Medicine | Admitting: Internal Medicine

## 2023-09-29 ENCOUNTER — Encounter: Payer: Self-pay | Admitting: Pharmacy Technician

## 2023-09-29 DIAGNOSIS — E861 Hypovolemia: Secondary | ICD-10-CM | POA: Diagnosis present

## 2023-09-29 DIAGNOSIS — Z79899 Other long term (current) drug therapy: Secondary | ICD-10-CM

## 2023-09-29 DIAGNOSIS — R6521 Severe sepsis with septic shock: Secondary | ICD-10-CM | POA: Diagnosis not present

## 2023-09-29 DIAGNOSIS — Z7401 Bed confinement status: Secondary | ICD-10-CM

## 2023-09-29 DIAGNOSIS — Z794 Long term (current) use of insulin: Secondary | ICD-10-CM

## 2023-09-29 DIAGNOSIS — R110 Nausea: Secondary | ICD-10-CM

## 2023-09-29 DIAGNOSIS — D631 Anemia in chronic kidney disease: Secondary | ICD-10-CM | POA: Diagnosis present

## 2023-09-29 DIAGNOSIS — I130 Hypertensive heart and chronic kidney disease with heart failure and stage 1 through stage 4 chronic kidney disease, or unspecified chronic kidney disease: Secondary | ICD-10-CM | POA: Diagnosis present

## 2023-09-29 DIAGNOSIS — N179 Acute kidney failure, unspecified: Secondary | ICD-10-CM | POA: Diagnosis present

## 2023-09-29 DIAGNOSIS — D62 Acute posthemorrhagic anemia: Secondary | ICD-10-CM | POA: Diagnosis not present

## 2023-09-29 DIAGNOSIS — I5043 Acute on chronic combined systolic (congestive) and diastolic (congestive) heart failure: Secondary | ICD-10-CM | POA: Diagnosis present

## 2023-09-29 DIAGNOSIS — E871 Hypo-osmolality and hyponatremia: Secondary | ICD-10-CM | POA: Diagnosis present

## 2023-09-29 DIAGNOSIS — Z5971 Insufficient health insurance coverage: Secondary | ICD-10-CM

## 2023-09-29 DIAGNOSIS — K921 Melena: Secondary | ICD-10-CM | POA: Diagnosis present

## 2023-09-29 DIAGNOSIS — Z87891 Personal history of nicotine dependence: Secondary | ICD-10-CM

## 2023-09-29 DIAGNOSIS — D689 Coagulation defect, unspecified: Secondary | ICD-10-CM | POA: Diagnosis present

## 2023-09-29 DIAGNOSIS — Z96651 Presence of right artificial knee joint: Secondary | ICD-10-CM | POA: Diagnosis present

## 2023-09-29 DIAGNOSIS — Z8701 Personal history of pneumonia (recurrent): Secondary | ICD-10-CM

## 2023-09-29 DIAGNOSIS — E785 Hyperlipidemia, unspecified: Secondary | ICD-10-CM | POA: Diagnosis present

## 2023-09-29 DIAGNOSIS — J8410 Pulmonary fibrosis, unspecified: Secondary | ICD-10-CM | POA: Diagnosis present

## 2023-09-29 DIAGNOSIS — Z7901 Long term (current) use of anticoagulants: Secondary | ICD-10-CM

## 2023-09-29 DIAGNOSIS — Z905 Acquired absence of kidney: Secondary | ICD-10-CM

## 2023-09-29 DIAGNOSIS — S72114A Nondisplaced fracture of greater trochanter of right femur, initial encounter for closed fracture: Secondary | ICD-10-CM | POA: Diagnosis present

## 2023-09-29 DIAGNOSIS — G9341 Metabolic encephalopathy: Secondary | ICD-10-CM | POA: Diagnosis present

## 2023-09-29 DIAGNOSIS — Z90710 Acquired absence of both cervix and uterus: Secondary | ICD-10-CM

## 2023-09-29 DIAGNOSIS — Z79890 Hormone replacement therapy (postmenopausal): Secondary | ICD-10-CM

## 2023-09-29 DIAGNOSIS — E8721 Acute metabolic acidosis: Secondary | ICD-10-CM | POA: Diagnosis present

## 2023-09-29 DIAGNOSIS — H548 Legal blindness, as defined in USA: Secondary | ICD-10-CM | POA: Diagnosis present

## 2023-09-29 DIAGNOSIS — Z66 Do not resuscitate: Secondary | ICD-10-CM | POA: Diagnosis present

## 2023-09-29 DIAGNOSIS — Z1611 Resistance to penicillins: Secondary | ICD-10-CM | POA: Diagnosis present

## 2023-09-29 DIAGNOSIS — Z9181 History of falling: Secondary | ICD-10-CM

## 2023-09-29 DIAGNOSIS — B9689 Other specified bacterial agents as the cause of diseases classified elsewhere: Secondary | ICD-10-CM | POA: Diagnosis present

## 2023-09-29 DIAGNOSIS — N390 Urinary tract infection, site not specified: Secondary | ICD-10-CM | POA: Diagnosis present

## 2023-09-29 DIAGNOSIS — A419 Sepsis, unspecified organism: Principal | ICD-10-CM | POA: Diagnosis present

## 2023-09-29 DIAGNOSIS — N184 Chronic kidney disease, stage 4 (severe): Secondary | ICD-10-CM | POA: Diagnosis present

## 2023-09-29 DIAGNOSIS — K219 Gastro-esophageal reflux disease without esophagitis: Secondary | ICD-10-CM | POA: Diagnosis present

## 2023-09-29 DIAGNOSIS — J449 Chronic obstructive pulmonary disease, unspecified: Secondary | ICD-10-CM | POA: Diagnosis present

## 2023-09-29 DIAGNOSIS — I480 Paroxysmal atrial fibrillation: Secondary | ICD-10-CM | POA: Diagnosis present

## 2023-09-29 DIAGNOSIS — L89302 Pressure ulcer of unspecified buttock, stage 2: Secondary | ICD-10-CM

## 2023-09-29 DIAGNOSIS — E1122 Type 2 diabetes mellitus with diabetic chronic kidney disease: Secondary | ICD-10-CM | POA: Diagnosis present

## 2023-09-29 DIAGNOSIS — Z803 Family history of malignant neoplasm of breast: Secondary | ICD-10-CM

## 2023-09-29 DIAGNOSIS — Z515 Encounter for palliative care: Secondary | ICD-10-CM

## 2023-09-29 DIAGNOSIS — I4891 Unspecified atrial fibrillation: Principal | ICD-10-CM | POA: Diagnosis present

## 2023-09-29 DIAGNOSIS — E440 Moderate protein-calorie malnutrition: Secondary | ICD-10-CM | POA: Diagnosis present

## 2023-09-29 DIAGNOSIS — N3289 Other specified disorders of bladder: Secondary | ICD-10-CM | POA: Diagnosis present

## 2023-09-29 DIAGNOSIS — N189 Chronic kidney disease, unspecified: Secondary | ICD-10-CM | POA: Diagnosis present

## 2023-09-29 DIAGNOSIS — Z8249 Family history of ischemic heart disease and other diseases of the circulatory system: Secondary | ICD-10-CM

## 2023-09-29 DIAGNOSIS — R310 Gross hematuria: Secondary | ICD-10-CM | POA: Diagnosis present

## 2023-09-29 DIAGNOSIS — L89312 Pressure ulcer of right buttock, stage 2: Secondary | ICD-10-CM | POA: Diagnosis present

## 2023-09-29 DIAGNOSIS — Z6831 Body mass index (BMI) 31.0-31.9, adult: Secondary | ICD-10-CM

## 2023-09-29 DIAGNOSIS — Z833 Family history of diabetes mellitus: Secondary | ICD-10-CM

## 2023-09-29 DIAGNOSIS — R54 Age-related physical debility: Secondary | ICD-10-CM | POA: Diagnosis not present

## 2023-09-29 DIAGNOSIS — E876 Hypokalemia: Secondary | ICD-10-CM | POA: Diagnosis not present

## 2023-09-29 LAB — CBC WITH DIFFERENTIAL/PLATELET
Abs Immature Granulocytes: 0.17 10*3/uL — ABNORMAL HIGH (ref 0.00–0.07)
Basophils Absolute: 0.1 10*3/uL (ref 0.0–0.1)
Basophils Relative: 0 %
Eosinophils Absolute: 0 10*3/uL (ref 0.0–0.5)
Eosinophils Relative: 0 %
HCT: 34.7 % — ABNORMAL LOW (ref 36.0–46.0)
Hemoglobin: 11.1 g/dL — ABNORMAL LOW (ref 12.0–15.0)
Immature Granulocytes: 1 %
Lymphocytes Relative: 20 %
Lymphs Abs: 3.8 10*3/uL (ref 0.7–4.0)
MCH: 28.8 pg (ref 26.0–34.0)
MCHC: 32 g/dL (ref 30.0–36.0)
MCV: 89.9 fL (ref 80.0–100.0)
Monocytes Absolute: 0.9 10*3/uL (ref 0.1–1.0)
Monocytes Relative: 5 %
Neutro Abs: 13.9 10*3/uL — ABNORMAL HIGH (ref 1.7–7.7)
Neutrophils Relative %: 74 %
Platelets: 445 10*3/uL — ABNORMAL HIGH (ref 150–400)
RBC: 3.86 MIL/uL — ABNORMAL LOW (ref 3.87–5.11)
RDW: 16.2 % — ABNORMAL HIGH (ref 11.5–15.5)
WBC: 18.9 10*3/uL — ABNORMAL HIGH (ref 4.0–10.5)
nRBC: 0 % (ref 0.0–0.2)

## 2023-09-29 LAB — COMPREHENSIVE METABOLIC PANEL
ALT: 15 U/L (ref 0–44)
AST: 37 U/L (ref 15–41)
Albumin: 2.5 g/dL — ABNORMAL LOW (ref 3.5–5.0)
Alkaline Phosphatase: 90 U/L (ref 38–126)
Anion gap: 22 — ABNORMAL HIGH (ref 5–15)
BUN: 137 mg/dL — ABNORMAL HIGH (ref 8–23)
CO2: 12 mmol/L — ABNORMAL LOW (ref 22–32)
Calcium: 8.1 mg/dL — ABNORMAL LOW (ref 8.9–10.3)
Chloride: 97 mmol/L — ABNORMAL LOW (ref 98–111)
Creatinine, Ser: 9.77 mg/dL — ABNORMAL HIGH (ref 0.44–1.00)
GFR, Estimated: 4 mL/min — ABNORMAL LOW (ref >60)
Glucose, Bld: 154 mg/dL — ABNORMAL HIGH (ref 70–99)
Potassium: 4.6 mmol/L (ref 3.5–5.1)
Sodium: 131 mmol/L — ABNORMAL LOW (ref 135–145)
Total Bilirubin: 1 mg/dL (ref <1.2)
Total Protein: 7.7 g/dL (ref 6.5–8.1)

## 2023-09-29 LAB — LACTIC ACID, PLASMA
Lactic Acid, Venous: 4.4 mmol/L (ref 0.5–1.9)
Lactic Acid, Venous: >9 mmol/L (ref 0.5–1.9)
Lactic Acid, Venous: 7.1 mmol/L (ref 0.5–1.9)

## 2023-09-29 LAB — PROTIME-INR
INR: >10 (ref 0.8–1.2)
Prothrombin Time: >90 s — ABNORMAL HIGH (ref 11.4–15.2)

## 2023-09-29 LAB — LIPASE, BLOOD: Lipase: 31 U/L (ref 11–51)

## 2023-09-29 MED ORDER — VANCOMYCIN HCL 1250 MG/250ML IV SOLN
1250.0000 mg | INTRAVENOUS | Status: DC
  Administered 2023-09-29: 1250 mg via INTRAVENOUS
  Filled 2023-09-29: qty 250

## 2023-09-29 MED ORDER — LACTATED RINGERS IV BOLUS
1000.0000 mL | Freq: Once | INTRAVENOUS | Status: AC
  Administered 2023-09-29: 1000 mL via INTRAVENOUS

## 2023-09-29 MED ORDER — SODIUM CHLORIDE 0.9 % IV BOLUS
1000.0000 mL | Freq: Once | INTRAVENOUS | Status: AC
  Administered 2023-09-29: 1000 mL via INTRAVENOUS

## 2023-09-29 MED ORDER — SODIUM CHLORIDE 0.9 % IV SOLN
2.0000 g | Freq: Once | INTRAVENOUS | Status: AC
  Administered 2023-09-29: 2 g via INTRAVENOUS
  Filled 2023-09-29: qty 12.5

## 2023-09-29 MED ORDER — NOREPINEPHRINE 4 MG/250ML-% IV SOLN
0.0000 ug/min | INTRAVENOUS | Status: DC
  Administered 2023-09-30: 2 ug/min via INTRAVENOUS
  Administered 2023-09-30: 8 ug/min via INTRAVENOUS
  Filled 2023-09-29 (×2): qty 250

## 2023-09-29 MED ORDER — VANCOMYCIN HCL 1500 MG/300ML IV SOLN
1500.0000 mg | INTRAVENOUS | Status: DC
  Filled 2023-09-29: qty 300

## 2023-09-29 MED ORDER — VANCOMYCIN HCL IN DEXTROSE 1-5 GM/200ML-% IV SOLN: 1000.0000 mg | Freq: Once | INTRAVENOUS | Status: DC

## 2023-09-29 NOTE — ED Notes (Signed)
One blood culture and blue top sent to lab.

## 2023-09-29 NOTE — ED Triage Notes (Signed)
Pt bib family with reports of worsening kidney function. States brought pt here last night but left due to the wait time. Pt also with worsening shob, hx CHF. Family also endorses pt has not eaten in 3 days due to decreased appetite.

## 2023-09-29 NOTE — ED Notes (Signed)
Repeat lactic acid resulted greater than 9.0, MD notified and verbal order to recollect.  Recollect 7.1 MD notified. MD notified of multiple critical coagulation studies.  MD notified of pt dark black tarry stools and frank blood in urine.  MD notified that pt current BP is 90/34.  MD ordered vasopressor and called family.

## 2023-09-29 NOTE — ED Provider Notes (Signed)
[facility] Provider Note    Event Date/Time   First MD Initiated Contact with Patient 09/29/23 1928     (approximate)   History   Abnormal Lab and Shortness of Breath   HPI  [name] is a 82 y.o. female  who presents to the emergency department today with primary concern for  weakness and dark urine. Symptoms have been present for roughly 5 days. The patient had actually come to the ER yesterday for these complaints however left after waiting in the waiting room. Was contacted by nursing staff after results returned and showed significant change in creatinine. Per chart review patient also had elevated WBC count. Family states patient has had significantly decreased oral intake. Was recently released from a rehab facility.      Physical Exam   Triage Vital Signs: ED Triage Vitals  Encounter Vitals Group     BP 09/29/23 1848 (!) 85/71     Systolic BP Percentile --      Diastolic BP Percentile --      Pulse Rate 09/29/23 1848 78     Resp 09/29/23 1848 (!) 22     Temp 09/29/23 1848 (!) 97.1 F (36.2 C)     Temp src --      SpO2 09/29/23 1848 90 %     Weight --      Height --      Head Circumference --      Peak Flow --      Pain Score 09/29/23 1846 4     Pain Loc --      Pain Education --      Exclude from Growth Chart --     Most recent vital signs: Vitals:   09/29/23 1848  BP: (!) 85/71  Pulse: 78  Resp: (!) 22  Temp: (!) 97.1 F (36.2 C)  SpO2: 90%   General: Awake, alert, oriented. CV:  Good peripheral perfusion. Regular rate and rhythm. Resp:  Normal effort. Lungs clear. Abd:  No distention.    ED Results / Procedures / Treatments   Labs (all labs ordered are listed, but only abnormal results are displayed) Labs Reviewed  CBC WITH DIFFERENTIAL/PLATELET - Abnormal; Notable for the following components:      Result Value   WBC 18.9 (*)    RBC 3.86 (*)    Hemoglobin 11.1 (*)    HCT 34.7 (*)    RDW 16.2 (*)     Platelets 445 (*)    Neutro Abs 13.9 (*)    Abs Immature Granulocytes 0.17 (*)    All other components within normal limits  LACTIC ACID, PLASMA  LACTIC ACID, PLASMA  COMPREHENSIVE METABOLIC PANEL  URINALYSIS, W/ REFLEX TO CULTURE (INFECTION SUSPECTED)  LIPASE, BLOOD     EKG  I, Phineas Semen, attending physician, personally viewed and interpreted this EKG  EKG Time: 1851 Rate: 153 Rhythm: atrial fibrillation with RVR Axis: normal Intervals: qtc 514 QRS: narrow, q waves v1 ST changes: no st elevation Impression: abnormal ekg   RADIOLOGY I independently interpreted and visualized the CXR. My interpretation: No pneumonia Radiology interpretation:  IMPRESSION:  No acute cardiopulmonary disease.      PROCEDURES:  Critical Care performed: Yes  CRITICAL CARE Performed by: Phineas Semen   Total critical care time: 35 minutes  Critical care time was exclusive of separately billable procedures and treating other patients.  Critical care was necessary to treat or prevent imminent or life-threatening deterioration.  Critical  care was time spent personally by me on the following activities: development of treatment plan with patient and/or surrogate as well as nursing, discussions with consultants, evaluation of patient's response to treatment, examination of patient, obtaining history from patient or surrogate, ordering and performing treatments and interventions, ordering and review of laboratory studies, ordering and review of radiographic studies, pulse oximetry and re-evaluation of patient's condition.   Procedures    MEDICATIONS ORDERED IN ED: Medications - No data to display   IMPRESSION / MDM / ASSESSMENT AND PLAN / ED COURSE  I reviewed the triage vital signs and the nursing notes.                              Differential diagnosis includes, but is not limited to, UTI, AKI, dehydration  Patient's presentation is most consistent with acute  presentation with potential threat to life or bodily function.   The patient is on the cardiac monitor to evaluate for evidence of arrhythmia and/or significant heart rate changes.  Patient presents to the emergency department today because of concerns for weakness.  Blood work yesterday did show significantly elevated creatinine as well as leukocytosis.  Blood work today is consistent with that.  Additionally patient has lactic acidosis. Vital signs concerning for atrial fibrillation with RVR and low blood pressure. Per chart review patient is frequently in afib with RVR.  Will start IV fluids and antibiotics. Discussed with Dr. Wynelle Link with nephrology. Will order imaging.   Patient's heart rate continued to be elevated blood pressures tend to be low.  I did discuss this with the patient's daughter over the telephone.  I discussed possibility of electrical cardioversion.  However patient's daughter states that she has had that attempted many times in the past and it has never been successful.  At this time then will continue IV fluids.  If patient is still hypotensive after 2 L will start pressors.     FINAL CLINICAL IMPRESSION(S) / ED DIAGNOSES   Final diagnoses:  Atrial fibrillation with RVR (HCC)  AKI (acute kidney injury) (HCC)     Note:  This document was prepared using Dragon voice recognition software and may include unintentional dictation errors.    Phineas Semen, MD 09/29/23 2251

## 2023-09-29 NOTE — ED Notes (Signed)
Repeat lactic sent to lab

## 2023-09-30 ENCOUNTER — Inpatient Hospital Stay: Payer: Medicare HMO

## 2023-09-30 DIAGNOSIS — Z905 Acquired absence of kidney: Secondary | ICD-10-CM | POA: Diagnosis not present

## 2023-09-30 DIAGNOSIS — N3289 Other specified disorders of bladder: Secondary | ICD-10-CM | POA: Diagnosis not present

## 2023-09-30 DIAGNOSIS — I5043 Acute on chronic combined systolic (congestive) and diastolic (congestive) heart failure: Secondary | ICD-10-CM | POA: Diagnosis present

## 2023-09-30 DIAGNOSIS — K838 Other specified diseases of biliary tract: Secondary | ICD-10-CM | POA: Diagnosis not present

## 2023-09-30 DIAGNOSIS — Z1611 Resistance to penicillins: Secondary | ICD-10-CM | POA: Diagnosis present

## 2023-09-30 DIAGNOSIS — N189 Chronic kidney disease, unspecified: Secondary | ICD-10-CM | POA: Diagnosis not present

## 2023-09-30 DIAGNOSIS — I959 Hypotension, unspecified: Secondary | ICD-10-CM | POA: Diagnosis not present

## 2023-09-30 DIAGNOSIS — E1122 Type 2 diabetes mellitus with diabetic chronic kidney disease: Secondary | ICD-10-CM | POA: Diagnosis present

## 2023-09-30 DIAGNOSIS — N179 Acute kidney failure, unspecified: Secondary | ICD-10-CM

## 2023-09-30 DIAGNOSIS — E871 Hypo-osmolality and hyponatremia: Secondary | ICD-10-CM | POA: Diagnosis present

## 2023-09-30 DIAGNOSIS — N184 Chronic kidney disease, stage 4 (severe): Secondary | ICD-10-CM | POA: Diagnosis present

## 2023-09-30 DIAGNOSIS — Z6831 Body mass index (BMI) 31.0-31.9, adult: Secondary | ICD-10-CM | POA: Diagnosis not present

## 2023-09-30 DIAGNOSIS — J449 Chronic obstructive pulmonary disease, unspecified: Secondary | ICD-10-CM | POA: Diagnosis present

## 2023-09-30 DIAGNOSIS — R109 Unspecified abdominal pain: Secondary | ICD-10-CM | POA: Diagnosis not present

## 2023-09-30 DIAGNOSIS — K449 Diaphragmatic hernia without obstruction or gangrene: Secondary | ICD-10-CM | POA: Diagnosis not present

## 2023-09-30 DIAGNOSIS — L89312 Pressure ulcer of right buttock, stage 2: Secondary | ICD-10-CM | POA: Diagnosis present

## 2023-09-30 DIAGNOSIS — D689 Coagulation defect, unspecified: Secondary | ICD-10-CM | POA: Diagnosis present

## 2023-09-30 DIAGNOSIS — N390 Urinary tract infection, site not specified: Secondary | ICD-10-CM | POA: Diagnosis present

## 2023-09-30 DIAGNOSIS — I4891 Unspecified atrial fibrillation: Secondary | ICD-10-CM

## 2023-09-30 DIAGNOSIS — R6521 Severe sepsis with septic shock: Secondary | ICD-10-CM | POA: Diagnosis not present

## 2023-09-30 DIAGNOSIS — S72114A Nondisplaced fracture of greater trochanter of right femur, initial encounter for closed fracture: Secondary | ICD-10-CM | POA: Diagnosis present

## 2023-09-30 DIAGNOSIS — G9341 Metabolic encephalopathy: Secondary | ICD-10-CM | POA: Diagnosis present

## 2023-09-30 DIAGNOSIS — A419 Sepsis, unspecified organism: Secondary | ICD-10-CM | POA: Diagnosis present

## 2023-09-30 DIAGNOSIS — Z66 Do not resuscitate: Secondary | ICD-10-CM | POA: Diagnosis present

## 2023-09-30 DIAGNOSIS — Z515 Encounter for palliative care: Secondary | ICD-10-CM | POA: Diagnosis not present

## 2023-09-30 DIAGNOSIS — I130 Hypertensive heart and chronic kidney disease with heart failure and stage 1 through stage 4 chronic kidney disease, or unspecified chronic kidney disease: Secondary | ICD-10-CM | POA: Diagnosis present

## 2023-09-30 DIAGNOSIS — I480 Paroxysmal atrial fibrillation: Secondary | ICD-10-CM | POA: Diagnosis present

## 2023-09-30 DIAGNOSIS — E8721 Acute metabolic acidosis: Secondary | ICD-10-CM | POA: Diagnosis present

## 2023-09-30 DIAGNOSIS — K921 Melena: Secondary | ICD-10-CM | POA: Diagnosis present

## 2023-09-30 DIAGNOSIS — E440 Moderate protein-calorie malnutrition: Secondary | ICD-10-CM | POA: Diagnosis present

## 2023-09-30 DIAGNOSIS — D62 Acute posthemorrhagic anemia: Secondary | ICD-10-CM | POA: Diagnosis not present

## 2023-09-30 DIAGNOSIS — D631 Anemia in chronic kidney disease: Secondary | ICD-10-CM | POA: Diagnosis present

## 2023-09-30 LAB — URINALYSIS, W/ REFLEX TO CULTURE (INFECTION SUSPECTED)
Bacteria, UA: NONE SEEN
RBC / HPF: >50 RBC/hpf (ref 0–5)
Squamous Epithelial / HPF: 0 /[HPF] (ref 0–5)
WBC, UA: >50 WBC/hpf (ref 0–5)

## 2023-09-30 LAB — URINALYSIS, COMPLETE (UACMP) WITH MICROSCOPIC
Bacteria, UA: NONE SEEN
RBC / HPF: >50 RBC/hpf (ref 0–5)
Squamous Epithelial / HPF: 0 /[HPF] (ref 0–5)
WBC, UA: >50 WBC/hpf (ref 0–5)

## 2023-09-30 LAB — RESPIRATORY PANEL BY PCR

## 2023-09-30 LAB — BASIC METABOLIC PANEL
Anion gap: 22 — ABNORMAL HIGH (ref 5–15)
Anion gap: 18 — ABNORMAL HIGH (ref 5–15)
BUN: 124 mg/dL — ABNORMAL HIGH (ref 8–23)
BUN: 124 mg/dL — ABNORMAL HIGH (ref 8–23)
CO2: 12 mmol/L — ABNORMAL LOW (ref 22–32)
CO2: 23 mmol/L (ref 22–32)
Calcium: 6.7 mg/dL — ABNORMAL LOW (ref 8.9–10.3)
Calcium: 7.1 mg/dL — ABNORMAL LOW (ref 8.9–10.3)
Chloride: 100 mmol/L (ref 98–111)
Chloride: 95 mmol/L — ABNORMAL LOW (ref 98–111)
Creatinine, Ser: 8.95 mg/dL — ABNORMAL HIGH (ref 0.44–1.00)
Creatinine, Ser: 8.31 mg/dL — ABNORMAL HIGH (ref 0.44–1.00)
GFR, Estimated: 4 mL/min — ABNORMAL LOW (ref >60)
GFR, Estimated: 4 mL/min — ABNORMAL LOW (ref >60)
Glucose, Bld: 189 mg/dL — ABNORMAL HIGH (ref 70–99)
Glucose, Bld: 161 mg/dL — ABNORMAL HIGH (ref 70–99)
Potassium: 4.1 mmol/L (ref 3.5–5.1)
Potassium: 3.7 mmol/L (ref 3.5–5.1)
Sodium: 134 mmol/L — ABNORMAL LOW (ref 135–145)
Sodium: 136 mmol/L (ref 135–145)

## 2023-09-30 LAB — CBC
HCT: 22.6 % — ABNORMAL LOW (ref 36.0–46.0)
Hemoglobin: 7.4 g/dL — ABNORMAL LOW (ref 12.0–15.0)
MCH: 28.7 pg (ref 26.0–34.0)
MCHC: 32.7 g/dL (ref 30.0–36.0)
MCV: 87.6 fL (ref 80.0–100.0)
Platelets: 363 10*3/uL (ref 150–400)
RBC: 2.58 MIL/uL — ABNORMAL LOW (ref 3.87–5.11)
RDW: 16.1 % — ABNORMAL HIGH (ref 11.5–15.5)
WBC: 18.4 10*3/uL — ABNORMAL HIGH (ref 4.0–10.5)
nRBC: 0 % (ref 0.0–0.2)

## 2023-09-30 LAB — HEMOGLOBIN AND HEMATOCRIT, BLOOD
HCT: 25.1 % — ABNORMAL LOW (ref 36.0–46.0)
HCT: 20.8 % — ABNORMAL LOW (ref 36.0–46.0)
HCT: 17.3 % — ABNORMAL LOW (ref 36.0–46.0)
Hemoglobin: 8.1 g/dL — ABNORMAL LOW (ref 12.0–15.0)
Hemoglobin: 7.1 g/dL — ABNORMAL LOW (ref 12.0–15.0)
Hemoglobin: 6.3 g/dL — ABNORMAL LOW (ref 12.0–15.0)

## 2023-09-30 LAB — MRSA NEXT GEN BY PCR, NASAL: MRSA by PCR Next Gen: NOT DETECTED

## 2023-09-30 LAB — BLOOD GAS, ARTERIAL
Acid-Base Excess: 5.2 mmol/L — ABNORMAL HIGH (ref 0.0–2.0)
Bicarbonate: 27.4 mmol/L (ref 20.0–28.0)
O2 Saturation: 99.3 %
Patient temperature: 37
pCO2 arterial: 32 mm[Hg] (ref 32–48)
pH, Arterial: 7.54 — ABNORMAL HIGH (ref 7.35–7.45)
pO2, Arterial: 95 mm[Hg] (ref 83–108)

## 2023-09-30 LAB — LACTIC ACID, PLASMA
Lactic Acid, Venous: 5.3 mmol/L (ref 0.5–1.9)
Lactic Acid, Venous: 4.3 mmol/L (ref 0.5–1.9)
Lactic Acid, Venous: 1.6 mmol/L (ref 0.5–1.9)

## 2023-09-30 LAB — PROTIME-INR
INR: 1.8 — ABNORMAL HIGH (ref 0.8–1.2)
INR: 2 — ABNORMAL HIGH (ref 0.8–1.2)
Prothrombin Time: 21 s — ABNORMAL HIGH (ref 11.4–15.2)
Prothrombin Time: 22.6 s — ABNORMAL HIGH (ref 11.4–15.2)

## 2023-09-30 LAB — BETA-HYDROXYBUTYRIC ACID: Beta-Hydroxybutyric Acid: 1.72 mmol/L — ABNORMAL HIGH (ref 0.05–0.27)

## 2023-09-30 LAB — COMPREHENSIVE METABOLIC PANEL
ALT: 17 U/L (ref 0–44)
AST: 44 U/L — ABNORMAL HIGH (ref 15–41)
Albumin: 1.9 g/dL — ABNORMAL LOW (ref 3.5–5.0)
Alkaline Phosphatase: 81 U/L (ref 38–126)
Anion gap: 20 — ABNORMAL HIGH (ref 5–15)
BUN: 122 mg/dL — ABNORMAL HIGH (ref 8–23)
CO2: 9 mmol/L — ABNORMAL LOW (ref 22–32)
Calcium: 6.9 mg/dL — ABNORMAL LOW (ref 8.9–10.3)
Chloride: 101 mmol/L (ref 98–111)
Creatinine, Ser: 8.85 mg/dL — ABNORMAL HIGH (ref 0.44–1.00)
GFR, Estimated: 4 mL/min — ABNORMAL LOW (ref >60)
Glucose, Bld: 235 mg/dL — ABNORMAL HIGH (ref 70–99)
Potassium: 4.6 mmol/L (ref 3.5–5.1)
Sodium: 130 mmol/L — ABNORMAL LOW (ref 135–145)
Total Bilirubin: 1.2 mg/dL — ABNORMAL HIGH (ref <1.2)
Total Protein: 5.7 g/dL — ABNORMAL LOW (ref 6.5–8.1)

## 2023-09-30 LAB — GLUCOSE, CAPILLARY
Glucose-Capillary: 243 mg/dL — ABNORMAL HIGH (ref 70–99)
Glucose-Capillary: 207 mg/dL — ABNORMAL HIGH (ref 70–99)
Glucose-Capillary: 182 mg/dL — ABNORMAL HIGH (ref 70–99)
Glucose-Capillary: 157 mg/dL — ABNORMAL HIGH (ref 70–99)
Glucose-Capillary: 122 mg/dL — ABNORMAL HIGH (ref 70–99)
Glucose-Capillary: 164 mg/dL — ABNORMAL HIGH (ref 70–99)
Glucose-Capillary: 164 mg/dL — ABNORMAL HIGH (ref 70–99)

## 2023-09-30 LAB — MAGNESIUM
Magnesium: 1.7 mg/dL (ref 1.7–2.4)
Magnesium: 1.6 mg/dL — ABNORMAL LOW (ref 1.7–2.4)

## 2023-09-30 LAB — BRAIN NATRIURETIC PEPTIDE: B Natriuretic Peptide: 353.6 pg/mL — ABNORMAL HIGH (ref 0.0–100.0)

## 2023-09-30 LAB — TYPE AND SCREEN
ABO/RH(D): O POS
Antibody Screen: NEGATIVE

## 2023-09-30 LAB — PHOSPHORUS
Phosphorus: 6.9 mg/dL — ABNORMAL HIGH (ref 2.5–4.6)
Phosphorus: 6.5 mg/dL — ABNORMAL HIGH (ref 2.5–4.6)

## 2023-09-30 LAB — PROCALCITONIN: Procalcitonin: 3.17 ng/mL

## 2023-09-30 MED ORDER — PROTHROMBIN COMPLEX CONC HUMAN 500 UNITS IV KIT
2000.0000 [IU] | PACK | Status: AC
  Administered 2023-09-30: 2000 [IU] via INTRAVENOUS
  Filled 2023-09-30: qty 2000

## 2023-09-30 MED ORDER — SODIUM BICARBONATE 8.4 % IV SOLN
100.0000 meq | Freq: Once | INTRAVENOUS | Status: AC
  Administered 2023-09-30: 100 meq via INTRAVENOUS
  Filled 2023-09-30: qty 50

## 2023-09-30 MED ORDER — SODIUM BICARBONATE 8.4 % IV SOLN
50.0000 meq | Freq: Once | INTRAVENOUS | Status: AC
Start: 2023-09-30 — End: 2023-09-30
  Administered 2023-09-30: 50 meq via INTRAVENOUS
  Filled 2023-09-30: qty 50

## 2023-09-30 MED ORDER — GLYCOPYRROLATE 1 MG PO TABS: 1.0000 mg | ORAL_TABLET | ORAL | Status: DC | PRN

## 2023-09-30 MED ORDER — HYDROMORPHONE HCL 1 MG/ML IJ SOLN
0.5000 mg | INTRAMUSCULAR | Status: DC | PRN
  Administered 2023-09-30: 0.5 mg via INTRAVENOUS
  Filled 2023-09-30: qty 1

## 2023-09-30 MED ORDER — PANTOPRAZOLE SODIUM 40 MG IV SOLR
40.0000 mg | Freq: Two times a day (BID) | INTRAVENOUS | Status: DC
  Administered 2023-09-30 (×2): 40 mg via INTRAVENOUS
  Filled 2023-09-30 (×2): qty 10

## 2023-09-30 MED ORDER — SODIUM CHLORIDE 0.9 % IV SOLN: INTRAVENOUS | Status: AC

## 2023-09-30 MED ORDER — MORPHINE SULFATE (PF) 2 MG/ML IV SOLN: 2.0000 mg | INTRAVENOUS | Status: DC | PRN

## 2023-09-30 MED ORDER — MAGNESIUM SULFATE 4 GM/100ML IV SOLN: 4.0000 g | Freq: Once | INTRAVENOUS | Status: DC

## 2023-09-30 MED ORDER — GLYCOPYRROLATE 0.2 MG/ML IJ SOLN: 0.2000 mg | INTRAMUSCULAR | Status: DC | PRN

## 2023-09-30 MED ORDER — MIDAZOLAM HCL 2 MG/2ML IJ SOLN: 2.0000 mg | INTRAMUSCULAR | Status: DC | PRN

## 2023-09-30 MED ORDER — ONDANSETRON HCL 4 MG/2ML IJ SOLN
4.0000 mg | Freq: Four times a day (QID) | INTRAMUSCULAR | Status: DC | PRN
  Administered 2023-09-30: 4 mg via INTRAVENOUS
  Filled 2023-09-30: qty 2

## 2023-09-30 MED ORDER — IOTHALAMATE MEGLUMINE 17.2 % UR SOLN
250.0000 mL | Freq: Once | URETHRAL | Status: AC | PRN
  Administered 2023-09-30: 250 mL via INTRAVESICAL

## 2023-09-30 MED ORDER — ORAL CARE MOUTH RINSE: 15.0000 mL | OROMUCOSAL | Status: DC | PRN

## 2023-09-30 MED ORDER — HYDROCORTISONE SOD SUC (PF) 100 MG IJ SOLR
100.0000 mg | Freq: Two times a day (BID) | INTRAMUSCULAR | Status: DC
  Administered 2023-09-30 (×2): 100 mg via INTRAVENOUS
  Filled 2023-09-30 (×2): qty 2

## 2023-09-30 MED ORDER — VANCOMYCIN HCL IN DEXTROSE 1-5 GM/200ML-% IV SOLN
1000.0000 mg | Freq: Once | INTRAVENOUS | Status: DC
  Filled 2023-09-30: qty 200

## 2023-09-30 MED ORDER — ACETAMINOPHEN 325 MG PO TABS: 650.0000 mg | ORAL_TABLET | Freq: Four times a day (QID) | ORAL | Status: DC | PRN

## 2023-09-30 MED ORDER — POLYVINYL ALCOHOL 1.4 % OP SOLN: 1.0000 [drp] | Freq: Four times a day (QID) | OPHTHALMIC | Status: DC | PRN

## 2023-09-30 MED ORDER — DOCUSATE SODIUM 100 MG PO CAPS: 100.0000 mg | ORAL_CAPSULE | Freq: Two times a day (BID) | ORAL | Status: DC | PRN

## 2023-09-30 MED ORDER — STERILE WATER FOR INJECTION IV SOLN
INTRAVENOUS | Status: DC
Start: 2023-09-30 — End: 2023-09-30
  Filled 2023-09-30 (×2): qty 1000
  Filled 2023-09-30: qty 150

## 2023-09-30 MED ORDER — PIPERACILLIN-TAZOBACTAM IN DEX 2-0.25 GM/50ML IV SOLN
2.2500 g | Freq: Three times a day (TID) | INTRAVENOUS | Status: DC
  Administered 2023-09-30: 2.25 g via INTRAVENOUS
  Filled 2023-09-30 (×2): qty 50

## 2023-09-30 MED ORDER — SODIUM CHLORIDE 0.9 % IV SOLN: 2.0000 g | Freq: Three times a day (TID) | INTRAVENOUS | Status: DC

## 2023-09-30 MED ORDER — ACETAMINOPHEN 650 MG RE SUPP: 650.0000 mg | Freq: Four times a day (QID) | RECTAL | Status: DC | PRN

## 2023-09-30 MED ORDER — VANCOMYCIN HCL 500 MG/100ML IV SOLN
500.0000 mg | Freq: Once | INTRAVENOUS | Status: DC
  Filled 2023-09-30: qty 100

## 2023-09-30 MED ORDER — POLYETHYLENE GLYCOL 3350 17 G PO PACK: 17.0000 g | PACK | Freq: Every day | ORAL | Status: DC | PRN

## 2023-09-30 MED ORDER — MAGNESIUM SULFATE 4 GM/100ML IV SOLN
4.0000 g | Freq: Once | INTRAVENOUS | Status: AC
Start: 2023-09-30 — End: 2023-09-30
  Administered 2023-09-30: 4 g via INTRAVENOUS
  Filled 2023-09-30: qty 100

## 2023-09-30 MED ORDER — VITAMIN K1 10 MG/ML IJ SOLN
10.0000 mg | INTRAVENOUS | Status: AC
  Administered 2023-09-30: 10 mg via INTRAVENOUS
  Filled 2023-09-30: qty 1

## 2023-09-30 MED ORDER — CHLORHEXIDINE GLUCONATE CLOTH 2 % EX PADS
6.0000 | MEDICATED_PAD | Freq: Every day | CUTANEOUS | Status: DC
  Administered 2023-09-30: 6 via TOPICAL

## 2023-09-30 MED ORDER — SODIUM BICARBONATE 8.4 % IV SOLN
INTRAVENOUS | Status: AC
  Filled 2023-09-30: qty 50

## 2023-09-30 MED ORDER — PIPERACILLIN-TAZOBACTAM IN DEX 2-0.25 GM/50ML IV SOLN
2.2500 g | Freq: Four times a day (QID) | INTRAVENOUS | Status: DC
  Administered 2023-09-30: 2.25 g via INTRAVENOUS
  Filled 2023-09-30 (×3): qty 50

## 2023-09-30 MED ORDER — VANCOMYCIN VARIABLE DOSE PER UNSTABLE RENAL FUNCTION (PHARMACIST DOSING): Status: DC

## 2023-09-30 MED ORDER — SODIUM CHLORIDE 0.9 % IV SOLN: 1.0000 g | INTRAVENOUS | Status: DC

## 2023-09-30 MED ORDER — INSULIN ASPART 100 UNIT/ML IJ SOLN
0.0000 [IU] | INTRAMUSCULAR | Status: DC
  Administered 2023-09-30: 3 [IU] via SUBCUTANEOUS
  Administered 2023-09-30: 5 [IU] via SUBCUTANEOUS
  Administered 2023-09-30 (×2): 3 [IU] via SUBCUTANEOUS
  Administered 2023-09-30: 2 [IU] via SUBCUTANEOUS
  Filled 2023-09-30 (×5): qty 1

## 2023-09-30 MED ORDER — SODIUM BICARBONATE 8.4 % IV SOLN
INTRAVENOUS | Status: DC
  Filled 2023-09-30: qty 150
  Filled 2023-09-30: qty 1000

## 2023-09-30 MED ORDER — MORPHINE SULFATE (PF) 2 MG/ML IV SOLN
2.0000 mg | INTRAVENOUS | Status: DC | PRN
  Administered 2023-10-01 – 2023-10-02 (×3): 2 mg via INTRAVENOUS
  Filled 2023-09-30 (×3): qty 1

## 2023-09-30 MED ORDER — METOPROLOL TARTRATE 5 MG/5ML IV SOLN
2.5000 mg | Freq: Once | INTRAVENOUS | Status: AC
Start: 2023-09-30 — End: 2023-09-30
  Administered 2023-09-30: 2.5 mg via INTRAVENOUS
  Filled 2023-09-30: qty 5

## 2023-09-30 NOTE — Progress Notes (Signed)
[facility]  ROUNDING NOTE   Subjective:   [name] was admitted to [facility] on 09/29/2023 for AKI (acute kidney injury) (HCC) [N17.9] Atrial fibrillation with RVR (HCC) [I48.91] Sepsis (HCC) [A41.9]  [name] and son at bedside. Patient is mostly bedbound and somnulent. Patient has been having bloody urine and stools for several days. Found to have progressive weakness. Came to the ED on 12/6 but left due to the wait time, and was called back after labs came back with acute kidney injury.   Nephrology consulted. Creatinine of 9.77 on admission from baseline creatinine of 2.55, GFR of 18 on 09/05/2023.   INR corrected with vitamin K. Placed on norepinephrine and being treated for septic shock.   Started on sodium bicarbonate infusion  Patient currently lethargic.  UOP .   Objective:  Vital signs in last 24 hours:  Temp:  [97.1 F (36.2 C)-97.9 F (36.6 C)] 97.9 F (36.6 C) (12/08 0800) Pulse Rate:  [78-149] 102 (12/08 1000) Resp:  [13-38] 17 (12/08 1000) BP: (52-127)/(25-107) 102/58 (12/08 1000) SpO2:  [90 %-100 %] 100 % (12/08 1000) Weight:  [68 kg] 68 kg (12/08 0200)  Weight change:  Filed Weights   09/30/23 0200  Weight: 68 kg    Intake/Output: I/O last 3 completed shifts: In: 598.6 [I.V.:467.7; IV Piggyback:130.9] Out: 550 [Urine:550]   Intake/Output this shift:  No intake/output data recorded.  Physical Exam: General: Ill appearing  Head: Normocephalic, atraumatic. Moist oral mucosal membranes, adentulous  Eyes: Anicteric, PERRL  Neck: Supple, trachea midline  Lungs:  Diminished bilaterally  Heart: irregular  Abdomen:  Soft, nontender, obese  Extremities:  + peripheral edema.  Neurologic: Nonfocal, moving all four extremities, alert to self, place and time only  Skin: No lesions  Access: none  GU Foley with red urine    Basic Metabolic Panel: Recent Labs  Lab 09/28/23 1733 09/29/23 1903 09/30/23 0125  09/30/23 0359  NA 130* 131* 130* 134*  K 3.7 4.6 4.6 4.1  CL 96* 97* 101 100  CO2 20* 12* 9* 12*  GLUCOSE 118* 154* 235* 189*  BUN 109* 137* 122* 124*  CREATININE 8.51* 9.77* 8.85* 8.95*  CALCIUM 8.0* 8.1* 6.9* 6.7*  MG  --   --  1.7 1.6*  PHOS  --   --  6.9* 6.5*    Liver Function Tests: Recent Labs  Lab 09/29/23 1903 09/30/23 0125  AST 37 44*  ALT 15 17  ALKPHOS 90 81  BILITOT 1.0 1.2*  PROT 7.7 5.7*  ALBUMIN 2.5* 1.9*   Recent Labs  Lab 09/29/23 1903  LIPASE 31   No results for input(s): "AMMONIA" in the last 168 hours.  CBC: Recent Labs  Lab 09/28/23 1733 09/29/23 1903 09/30/23 0125 09/30/23 0359  WBC 18.2* 18.9*  --  18.4*  NEUTROABS  --  13.9*  --   --   HGB 11.5* 11.1* 8.1* 7.4*  HCT 34.8* 34.7* 25.1* 22.6*  MCV 86.4 89.9  --  87.6  PLT 371 445*  --  363    Cardiac Enzymes: No results for input(s): "CKTOTAL", "CKMB", "CKMBINDEX", "TROPONINI" in the last 168 hours.  BNP: Invalid input(s): "POCBNP"  CBG: Recent Labs  Lab 09/30/23 0109 09/30/23 0150 09/30/23 0403 09/30/23 0746  GLUCAP 243* 207* 182* 157*    Microbiology: Results for orders placed or performed during the hospital encounter of 09/29/23  Culture, blood (Routine X 2) w Reflex to ID Panel     Status: None (Preliminary  result)   Collection Time: 09/30/23  1:26 AM   Specimen: BLOOD LEFT ARM  Result Value Ref Range Status   Specimen Description BLOOD LEFT ARM  Final   Special Requests   Final    BOTTLES DRAWN AEROBIC AND ANAEROBIC Blood Culture adequate volume   Culture   Final    NO GROWTH < 12 HOURS Performed at [facility], [address] [place], [place] [address]    Report Status PENDING  Incomplete  Culture, blood (Routine X 2) w Reflex to ID Panel     Status: None (Preliminary result)   Collection Time: 09/30/23  1:31 AM   Specimen: BLOOD RIGHT HAND  Result Value Ref Range Status   Specimen Description BLOOD RIGHT HAND  Final   Special Requests   Final     BOTTLES DRAWN AEROBIC AND ANAEROBIC Blood Culture results may not be optimal due to an inadequate volume of blood received in culture bottles   Culture   Final    NO GROWTH < 12 HOURS Performed at [facility], [address] [place], [place] [address]    Report Status PENDING  Incomplete  MRSA Next Gen by PCR, Nasal     Status: None   Collection Time: 09/30/23  1:36 AM   Specimen: Nasal Mucosa; Nasal Swab  Result Value Ref Range Status   MRSA by PCR Next Gen NOT DETECTED NOT DETECTED Final    Comment: (NOTE) The GeneXpert MRSA Assay (FDA approved for NASAL specimens only), is one component of a comprehensive MRSA colonization surveillance program. It is not intended to diagnose MRSA infection nor to guide or monitor treatment for MRSA infections. Test performance is not FDA approved in patients less than 24 years old. Performed at [facility], [address] [place], [place] [address]   Respiratory (~20 pathogens) panel by PCR     Status: None   Collection Time: 09/30/23  2:47 AM  Result Value Ref Range Status   Adenovirus NOT DETECTED NOT DETECTED Final   Coronavirus 229E NOT DETECTED NOT DETECTED Final    Comment: (NOTE) The Coronavirus on the Respiratory Panel, DOES NOT test for the novel  Coronavirus 17-Oct-2018 nCoV)    Coronavirus HKU1 NOT DETECTED NOT DETECTED Final   Coronavirus NL63 NOT DETECTED NOT DETECTED Final   Coronavirus OC43 NOT DETECTED NOT DETECTED Final   Metapneumovirus NOT DETECTED NOT DETECTED Final   Rhinovirus / Enterovirus NOT DETECTED NOT DETECTED Final   Influenza A NOT DETECTED NOT DETECTED Final   Influenza B NOT DETECTED NOT DETECTED Final   Parainfluenza Virus 1 NOT DETECTED NOT DETECTED Final   Parainfluenza Virus 2 NOT DETECTED NOT DETECTED Final   Parainfluenza Virus 3 NOT DETECTED NOT DETECTED Final   Parainfluenza Virus 4 NOT DETECTED NOT DETECTED Final   Respiratory Syncytial Virus NOT DETECTED NOT DETECTED Final    Bordetella pertussis NOT DETECTED NOT DETECTED Final   Bordetella Parapertussis NOT DETECTED NOT DETECTED Final   Chlamydophila pneumoniae NOT DETECTED NOT DETECTED Final   Mycoplasma pneumoniae NOT DETECTED NOT DETECTED Final    Comment: Performed at [facility], [address] [place], [place] [address]    Coagulation Studies: Recent Labs    09/29/23 2158-10-17 09/30/23 0125 09/30/23 0359  LABPROT >90.0* 21.0* 22.6*  INR >10.0* 1.8* 2.0*    Urinalysis: Recent Labs    09/29/23 Oct 18, 2155 09/30/23 0405  COLORURINE RED* RED*  LABSPEC TEST NOT REPORTED DUE TO COLOR INTERFERENCE OF URINE  PIGMENT TEST NOT REPORTED DUE TO COLOR INTERFERENCE OF URINE PIGMENT  PHURINE TEST NOT REPORTED DUE TO COLOR INTERFERENCE OF URINE PIGMENT TEST NOT REPORTED DUE TO COLOR INTERFERENCE OF URINE PIGMENT  GLUCOSEU TEST NOT REPORTED DUE TO COLOR INTERFERENCE OF URINE PIGMENT* TEST NOT REPORTED DUE TO COLOR INTERFERENCE OF URINE PIGMENT*  HGBUR TEST NOT REPORTED DUE TO COLOR INTERFERENCE OF URINE PIGMENT* TEST NOT REPORTED DUE TO COLOR INTERFERENCE OF URINE PIGMENT*  BILIRUBINUR TEST NOT REPORTED DUE TO COLOR INTERFERENCE OF URINE PIGMENT* TEST NOT REPORTED DUE TO COLOR INTERFERENCE OF URINE PIGMENT*  KETONESUR TEST NOT REPORTED DUE TO COLOR INTERFERENCE OF URINE PIGMENT* TEST NOT REPORTED DUE TO COLOR INTERFERENCE OF URINE PIGMENT*  PROTEINUR TEST NOT REPORTED DUE TO COLOR INTERFERENCE OF URINE PIGMENT* TEST NOT REPORTED DUE TO COLOR INTERFERENCE OF URINE PIGMENT*  NITRITE TEST NOT REPORTED DUE TO COLOR INTERFERENCE OF URINE PIGMENT* TEST NOT REPORTED DUE TO COLOR INTERFERENCE OF URINE PIGMENT*  LEUKOCYTESUR TEST NOT REPORTED DUE TO COLOR INTERFERENCE OF URINE PIGMENT* TEST NOT REPORTED DUE TO COLOR INTERFERENCE OF URINE PIGMENT*      Imaging: DG Abd 1 View  Result Date: 09/30/2023 CLINICAL DATA:  Abdominal pain EXAM: ABDOMEN - 1 VIEW COMPARISON:  None Available. FINDINGS: Scattered large and small bowel  gas is noted. No obstructive changes are seen. No free air is noted. Postsurgical changes and vascular calcifications are seen. No acute bony abnormality is noted. IMPRESSION: No acute abnormality noted. Electronically Signed   By: Alcide Clever M.D.   On: 09/30/2023 03:15   US RENAL  Result Date: 09/29/2023 CLINICAL DATA:  Acute kidney injury EXAM: RENAL / URINARY TRACT ULTRASOUND COMPLETE COMPARISON:  CT abdomen/pelvis dated 08/14/2023 FINDINGS: Right Kidney: Surgically absent. Left Kidney: Renal measurements: 9.9 x 5.9 x 6.1 cm = volume: 185 mL. Echogenicity within normal limits. No mass or hydronephrosis visualized. Bladder: Poorly visualized/underdistended. Other: None. IMPRESSION: Surgically absent right kidney. Normal left kidney. Electronically Signed   By: Charline Bills M.D.   On: 09/29/2023 21:38   DG Chest 2 View  Result Date: 09/29/2023 CLINICAL DATA:  Shortness of breath EXAM: CHEST - 2 VIEW COMPARISON:  09/28/2023 FINDINGS: Heart and mediastinal contours are within normal limits. Peripheral interstitial opacities noted compatible with scarring. No acute focal opacities or effusions. No acute bony abnormality. Aortic atherosclerosis. IMPRESSION: No acute cardiopulmonary disease. Electronically Signed   By: Charlett Nose M.D.   On: 09/29/2023 19:27   DG Chest 2 View  Result Date: 09/28/2023 CLINICAL DATA:  Weakness EXAM: CHEST - 2 VIEW COMPARISON:  Chest x-ray 08/14/2023 FINDINGS: Pneumothorax or effusion. Enlarged cardiopericardial silhouette with some interstitial changes noted bilaterally. Calcified aorta. There are some calcifications along the tracheobronchial tree. No consolidation, pneumothorax or effusion. Osteopenia. Degenerative changes. IMPRESSION: Underinflation with diffuse interstitial changes, possibly chronic. No consolidation or significant effusion today Electronically Signed   By: Karen Kays M.D.   On: 09/28/2023 18:37     Medications:    norepinephrine (LEVOPHED)  Adult infusion 8 mcg/min (09/30/23 0926)   piperacillin-tazobactam (ZOSYN)  IV Stopped (09/30/23 0539)   sodium bicarbonate 150 mEq in sterile water 1,150 mL infusion 150 mL/hr at 09/30/23 0653    Chlorhexidine Gluconate Cloth  6 each Topical Daily   hydrocortisone sod succinate (SOLU-CORTEF) inj  100 mg Intravenous Q12H   insulin aspart  0-15 Units Subcutaneous Q4H   pantoprazole (PROTONIX) IV  40 mg Intravenous Q12H   vancomycin variable dose per unstable renal function (pharmacist dosing)  Does not apply See admin instructions   docusate sodium, HYDROmorphone (DILAUDID) injection, ondansetron (ZOFRAN) IV, mouth rinse, polyethylene glycol  Assessment/ Plan:  [name] is a 82 y.o.  female with right nephrectomy, diastolic congestive heart failure, hypertension, hyperlipidemia, insulin dependent diabetes mellitus type II, atrial fibrillation who presents to [facility] on 09/29/2023 for AKI (acute kidney injury) (HCC) [N17.9] Atrial fibrillation with RVR (HCC) [I48.91] Sepsis (HCC) [A41.9]  Acute Kidney Injury on chronic kidney disease stage IV. Baseline creatinine of 2.55, GFR of 18 on 09/05/2023. Oliguric urine output - no acute indication for dialysis - patient only wants dialysis if it preserves her quality of life.   Acute metabolic acidosis secondary to lactic acidosis and septic shock - Change to sodium bicarbonate in D5 solution at 39mL/hr  Hypotension secondary to sepsis and shock: cultures currently with no growth.  - empiric antibiotics - vasopressor: norepinephrine gtt  Hyponatremia: secondary to chronic kidney disease and hypovolemia - IV fluids as above  Anemia with chronic kidney disease: normocytic.  - low threshold to start ESA  Gross hematuria: with INR > 10 on admission. Status post vitamin K. Hematuria starting to clear.    LOS: 0 Muzammil Bruins 12/8/202410:46 AM

## 2023-09-30 NOTE — ED Notes (Signed)
Report called to ICU RN, Cammy Copa

## 2023-09-30 NOTE — ED Provider Notes (Signed)
12:08 AM  Assumed care at shift change.  Patient here with weakness, dark urine.  Here patient is hypotensive, tachycardic, tachypneic with leukocytosis of 18,000.  Initial lactic was 4.4.  Patient received 3 L of IV fluids and lactic increasing.  Continues to be hypotensive.  Will start IV Levophed.   Urine shows gross hematuria.  Culture pending.  She also has melena that is guaiac positive.  Initial hemoglobin was 11.  INR greater than 10.  I will give Kcentra and vitamin K.  Will repeat H&H.   Discussed findings and treatment plan with patient's daughter by phone.  They are aware that patient is critically ill.  At this time daughter confirms that patient is a DNR/DNI but they would want continued workup, treatment including vasopressors, central line, antibiotics, fluids, reversal of her anticoagulation and admission to the ICU.  Daughter plans to talk to other family members and hopefully with a decision in the morning to determine if they would transition patient to comfort care if she is not improving.   CRITICAL CARE Performed by: Rochele Raring   Total critical care time: 30 minutes  Critical care time was exclusive of separately billable procedures and treating other patients.  Critical care was necessary to treat or prevent imminent or life-threatening deterioration.  Critical care was time spent personally by me on the following activities: development of treatment plan with patient and/or surrogate as well as nursing, discussions with consultants, evaluation of patient's response to treatment, examination of patient, obtaining history from patient or surrogate, ordering and performing treatments and interventions, ordering and review of laboratory studies, ordering and review of radiographic studies, pulse oximetry and re-evaluation of patient's condition.    Katilin Raynes, Layla Maw, DO 09/30/23 0111

## 2023-09-30 NOTE — Progress Notes (Signed)
PHARMACY NOTE:  ANTIMICROBIAL RENAL DOSAGE ADJUSTMENT  Current antimicrobial regimen includes a mismatch between antimicrobial dosage and estimated renal function.  As per policy approved by the Pharmacy & Therapeutics and Medical Executive Committees, the antimicrobial dosage will be adjusted accordingly.  Current antimicrobial dosage:  zosyn 4.5 gm IV Q6H   Indication: sepsis, possible abdominal source   Renal Function:  Estimated Creatinine Clearance: 4 mL/min (A) (by C-G formula based on SCr of 8.85 mg/dL (H)). []      On intermittent HD, scheduled: []      On CRRT    Antimicrobial dosage has been changed to:  Zosyn 2.25 gm IV Q6H   Additional comments:   Thank you for allowing pharmacy to be a part of this patient's care.  Westlee Devita D, [facility] 09/30/2023 4:55 AM

## 2023-09-30 NOTE — IPAL (Signed)
  Interdisciplinary Goals of Care Family Meeting   Date carried out: 09/30/2023  Location of the meeting: Bedside  Member's involved: Physician, Bedside Registered Nurse, and Family Member or next of kin    GOALS OF CARE DISCUSSION  The Clinical status was relayed to family in detail- 2 sisters at bedside, One sister over the phone  Updated and notified of patients medical condition-  Explained to family course of therapy and the modalities   Patient with Progressive multiorgan failure with a very high probablity of a very minimal chance of meaningful recovery despite all aggressive and optimal medical therapy.    Family understands the situation Progressive renal failure and possible bladder rupture Patient with very poor quality of life   They have consented and agreed to DNR/DNI and would like to proceed with Comfort care measures once we obtain CT abd pelvis  Family are satisfied with Plan of action and management. All questions answered  Additional CC time 35 mins   Navika Hoopes Santiago Glad, M.D.  [facility]  Medical Director [facility] Medical Director [facility] Cardio-Pulmonary Department

## 2023-09-30 NOTE — Progress Notes (Signed)
PHARMACY - ANTICOAGULATION CONSULT NOTE  Pharmacy Consult for  K-Centra monitoring  Indication: elevated INR, hematuria  No Known Allergies  Patient Measurements:   Heparin Dosing Weight:   Vital Signs: Temp: 97.8 F (36.6 C) (12/07 2310) Temp Source: Oral (12/07 2310) BP: 105/55 (12/08 0055) Pulse Rate: 114 (12/08 0055)  Labs: Recent Labs    09/28/23 1733 09/29/23 1903 09/29/23 2159 09/30/23 0125  HGB 11.5* 11.1*  --   --   HCT 34.8* 34.7*  --   --   PLT 371 445*  --   --   LABPROT  --   --  >90.0* 21.0*  INR  --   --  >10.0* 1.8*  CREATININE 8.51* 9.77*  --   --     Estimated Creatinine Clearance: 3.7 mL/min (A) (by C-G formula based on SCr of 9.77 mg/dL (H)).   Medical History: Past Medical History:  Diagnosis Date   Anemia    Atrial fibrillation (HCC)    Chronic kidney disease    Congestive heart failure (CHF) (HCC)    COPD (chronic obstructive pulmonary disease) (HCC)    Diabetes mellitus without complication (HCC)    GERD (gastroesophageal reflux disease)    Hyperlipidemia    Hypertension    Pneumonia    Pulmonary fibrosis (HCC)     Medications:  Medications Prior to Admission  Medication Sig Dispense Refill Last Dose   albuterol (VENTOLIN HFA) 108 (90 Base) MCG/ACT inhaler Inhale 2 puffs into the lungs every 6 (six) hours as needed for wheezing or shortness of breath. 51 each 2    amLODipine (NORVASC) 5 MG tablet Take 1 tablet (5 mg total) by mouth daily. 90 tablet 1    atorvastatin (LIPITOR) 10 MG tablet Take 1 tablet (10 mg total) by mouth every evening. 90 tablet 3    citalopram (CELEXA) 20 MG tablet Take 1 tablet (20 mg total) by mouth daily. 90 tablet 3    donepezil (ARICEPT) 5 MG tablet Take 1 tablet (5 mg total) by mouth every morning. 90 tablet 3    ferrous sulfate 325 (65 FE) MG tablet Take 1 tablet (325 mg total) by mouth daily with breakfast.  3    insulin glargine (LANTUS) 100 UNIT/ML Solostar Pen Inject 10 Units into the skin daily. 15  mL 3    Insulin Pen Needle (PEN NEEDLES 3/16") 31G X 5 MM MISC 1 applicator by Does not apply route 4 (four) times daily. 100 each 1    lidocaine (LIDODERM) 5 % Place 1 patch onto the skin daily. Remove & Discard patch within 12 hours or as directed by MD 30 patch 0    linaclotide (LINZESS) 145 MCG CAPS capsule Take 1 capsule (145 mcg total) by mouth at bedtime. 90 capsule 1    melatonin 5 MG TABS Take 5 mg by mouth at bedtime as needed (sleep).      montelukast (SINGULAIR) 10 MG tablet Take 1 tablet (10 mg total) by mouth at bedtime. 90 tablet 3    oxyCODONE-acetaminophen (PERCOCET/ROXICET) 5-325 MG tablet Take 1 tablet by mouth every 6 (six) hours as needed for moderate pain (pain score 4-6) or severe pain (pain score 7-10). 120 tablet 0    pantoprazole (PROTONIX) 40 MG tablet Take 1 tablet (40 mg total) by mouth 2 (two) times daily. 180 tablet 3    polyethylene glycol (MIRALAX / GLYCOLAX) 17 g packet Take 17 g by mouth daily as needed for mild constipation. 14 each 0  rOPINIRole (REQUIP) 0.5 MG tablet Take 1 tablet (0.5 mg total) by mouth every evening. 90 tablet 3    vitamin B-12 1000 MCG tablet Take 1 tablet (1,000 mcg total) by mouth daily.      warfarin (COUMADIN) 2 MG tablet Take 1 tablet (2 mg total) by mouth daily at 4 PM. Take 1 tab po Tuesday ,Thursday ,Friday ad Saturday and Sunday and then take 4 mg rest of week 90 tablet 1     Assessment: Pharmacy consulted to monitor INR in this 82 year old female presenting with elevated INR (>10.0) and hematuria.  Pt was on warfarin PTA.   12/7:  INR @ 2159 = > 10.0 12/8 @ 0033 :  Theodoro Parma 2000 units IV X 1 given in ED. 12/8:  INR @ 0125 = 1.8   Goal of Therapy:  INR < 2.0   Plan:  KCentra 2000 units IV X 1 given on 12/8 @ 0033. - Will recheck INR 1 hr after completion of KCentra on 12/8 @ 0200. - Will check INR daily X 2.   Koury Roddy D 09/30/2023,2:05 AM

## 2023-09-30 NOTE — H&P (Signed)
NAME:  [name], MRN:  [id number], DOB:  1941-08-08, LOS: 0 ADMISSION DATE:  09/29/2023, CONSULTATION DATE:  09/30/2023 REFERRING MD:  Baxter Hire Ward CHIEF COMPLAINT:  Sepsis   History of Present Illness:  [name] is an 82 year old female with a PMH significant for HTN, HLD, CHF, Afib on coumadin (no rate control due to chronic bradycardia), COPD, Pulmonary fibrosis, Diabetes type 2 insulin dependent, CKD stage IV s/p Right Nephrectomy, Anemia, GERD, and recent Right femur fracture (treated medically) that presented 09/29/2023 with Generalized weakness progressing for past 5 days. According to patient and family patient had a fall 10/22 with resultant hip fracture requiring hospitalization 10/22-11/10/2022 followed by rehab at [facility] for 4 weeks. Over that past 5 days patient has not been eating or drinking much, increased fatigue, no cough, fevers, or chills. Daughter states patient has had bloody urine and constipation. 12/6 patient went to the ER and labs sent, however left due to the wait time, patient left. They revealed worsening renal function BUN 137/creat 9.77 (baseline creat 2.4, BUN 35 over past 6 months). Patient was notified today of these results and presented to the ER were labs confirmed worsening function BUN 137, creat 9.77, WBC 18.9, Lactic acid 9.0, INR > 10.   ED course: Patient went into Afib RVR shortly after arrival to the ER. Patient was given 3 liters IVF for resuscitation. K-centra and Vitamin K was administered for reversal of her INR > 10, repeat now 1.8. Cultures sent, UA non diagnostic due to gross hematuria and culture pending, CXR without evidence of acute cardiopulmonary process, Renal US without hydronephrosis or mass (surgically absent right kidney). With ongoing hypotension patient was started on Levophed infusion and admitted to the Medical ICU for continuation of care.   Medications given: Cefepime, Vancomycin, 3 Liters IVF, Kcentra, Vitamin K,  Levophed infusion Initial Vitals: Temp 97.1, BP 85/71, HR 78, RR 22 Significant labs: (Labs/ Imaging personally reviewed) EKG Interpretation: Date: 09/29/2023 EKG Time: 1851 Rate: 153 Rhythm: Afib RVR, S/T Wave abnormalities, Narrative Interpretation: Afib w/ RVR, prolonged Qtc 514 Chemistry: Na+:131, K+: 4.6, BUN/Cr.: 137/9.77, Serum CO2/ AG: 12/22 Hematology: WBC: 18.9, Hgb: 11.1, Plts 445, INR > 10< PT >90, Lactic >9, Covid and Influenza A/B pending CXR: No acute cardiopulmonary process CT Abd/Pelvis and KUB pending  PCCM consulted for admission due to Septic Shock requiring Vasopressors.   Pertinent  Medical History  -pAFib on coumadin -CHF -Acute Hypotension -CKD stage IV (Solitary Left Kidney) -COPD/Pulmonary fibrosis -Diabetes type 2 insulin dependent  Significant Hospital Events: Including procedures, antibiotic start and stop dates in addition to other pertinent events   12/8 admitted to ICU with Septic shock of unclear etiology. 3 Liters IVF, Levophed, Cultures, antibiotics, Bicarbonate push and drip, INR > 10, K-centra and Vitamin K. Afib RVR, metoprolol 2.5 mg x 1. Concern for GIB due coagulopathy, trend H/H. Nephrology consulted. DNR/DNI with aggressive care  Interim History / Subjective:  Upon arrival to the ICU patient was awake, alert, oriented x 4 but poor historian. Requiring Levophed infusion at 5 mcg/min. INR normalized to 1.8, lactic remains elevated at 5.3. Antibiotics have been administered, patient adequately fluid resuscitated. Bicarbonate level 12, 1 amp given and infusion started at 100 ml/hr, anion gap 20. Son and daughter present and state patient is a DNR/DNI. They are awaiting additional family to make further decisions.   Objective   Blood pressure (!) 86/61, pulse (!) 142, temperature 97.6 F (36.4 C), temperature source Axillary, resp. rate  13, height 4\' 10"  (1.473 m), weight 68 kg, SpO2 100%.      Intake/Output Summary (Last 24 hours) at 09/30/2023  0458 Last data filed at 09/30/2023 0430 Gross per 24 hour  Intake 211.2 ml  Output 350 ml  Net -138.8 ml   Filed Weights   09/30/23 0200  Weight: 68 kg   Examination:  Physical Exam Vitals reviewed.  Constitutional:      General: She is not in acute distress.    Appearance: She is ill-appearing. She is not diaphoretic.  HENT:     Head: Normocephalic and atraumatic.     Mouth/Throat:     Pharynx: Oropharynx is clear.  Eyes:     Pupils: Pupils are equal, round, and reactive to light.     Comments: Legally blind  Neck:     Vascular: No JVD.  Cardiovascular:     Comments: Afib with RVR Pulses +1/+1 in all extremities Pulmonary:     Effort: Tachypnea present. No accessory muscle usage.     Breath sounds: Decreased breath sounds present.  Abdominal:     Palpations: Abdomen is soft.     Tenderness: There is abdominal tenderness.     Comments: Hypoactive bowel sounds Abdominal tenderness to light touch   Genitourinary:    Comments: Foley with Clear Pink/Red urine, no clots Musculoskeletal:     Cervical back: Neck supple.     Right lower leg: No edema.     Left lower leg: No edema.     Comments: Generalized weakness  Skin:    General: Skin is warm and dry.     Capillary Refill: Capillary refill takes 2 to 3 seconds.     Coloration: Skin is pale.     Findings: Ecchymosis and erythema (LUE) present.     Comments: Scattered bruising to BUE  Right buttock with chronic pressure ulcers (see media)  Neurological:     Mental Status: She is alert and oriented to person, place, and time.     Motor: Weakness present.     Comments: Poor historian, unable to provide detailed history Follows commands   Psychiatric:        Mood and Affect: Mood normal.    Assessment & Plan:   Sepsis with septic shock due to unclear etiology, suspected UTI vs. PNA vs. Abdominal source Lactic: > 9.0, PCT: Pending, UA: Non diagnostic, CXR: No acute process, CT: Pending  Initial  interventions/workup included: 3 L of NS/LR & Cefepime/ Vancomycin - Supplemental oxygen as needed, to maintain SpO2 > 90% - Daily CBC, monitor WBC/ fever curve - Cultures: BC x 2, Repeat UA, Urine Cx, MRSA swab negative, Viral Panel pending - Trend lactic until clears, Procalcitonin pending,  - IV antibiotics: Zosyn & Vancomycin  - 50 meq Sodium bicarb x 2, followed by infusion @ 100 ml/hr. Repeat push as needed - Levophed infusion to achieve MAP> 65 - Strict I/O's: alert provider if UOP < 0.5 mL/kg/hr - Hydrocortisone 100 mg Q 12 hours with Persistent hypotension   Metabolic Acidosis - Treatment as above - Send Beta-hydroxybutyrate and ABG, may require insulin infusion, however likely related to renal failure and sepsis  Multifactorial Encephalopathy in the setting of Sepsis, Metabolic Derangements - Maintain sleep/wake cycle - Avoid sedating medications as able   Acute Hypotension secondary to above - MAP goal > 65 - Levophed infusion, titrate to achieve goal - Hold home antihypertensives (med rec pending)  Atrial Fibrillation with RVR, on Coumadin Acute on chronic HFpEF decompensation  INR > 10 on admission, Chronically bradycardic, not on rate control medications, Echo 08/15/23 EF 45-50% - HR goal 60-120 - Metoprolol 2.5 mg x 1 now, will repeat if tolerates. Using with caution  - Coumadin reversed with Kcentra and Vitamin K, continue to hold coumadin - INR in AM  COPD, not in exacerbation Former smoker, quit in past year - Oxygen as needed to achieve SPO2 > 88% - Pending home med recs - Duonebs Q 6 hours PRN   Acute on Chronic Kidney Disease stage IV, (right nephrectomy 1970's) Baseline creat 2.4, BUN 35 - BUN 137/creat 9.77 on admission - Renal US without hydronephrosis or Mass - Nephrology consulted, family to discuss if patient would want dialysis - Avoid nephrotoxic agents, renally dose medications - Trend Renal Panel  - Bicarbonate as above   Type 2 Diabetes  Mellitus, with long-term current use of insulin  Potential concern for DKA, less likely HgbA1c 07/10/2023: 7.7  - Glucose goal < 180 - Sliding scale insulin with glucose checks Q 4 hours - Start Lantus tomorrow pending Med Rec and clinical course vs. Insulin infusion - Beta-hydroxybutyrate pending  Anemia of Chronic Disease Concern for GIB in the setting of hypercoagulable state in the setting of INR > 10 GERD - Protonix - Hgb goal > 7, trend H&H Q 6 hours, CBC daily - Transfuse as needed to achieve goal - Consider CT abdomen/pelvis - Consider GI consultation   Hematuria in the setting of hypercoagulable state in the setting of INR > 10 Patient with recent  - Place foley to allow for irrigation as needed - Consider Urology consultation  Closed Nondisplaced Fracture of Greater Trochanter of Right Femur, Medical management  S/p fall 08/14/2023, non surgical management - Order PT/OT once appropriate - Pain control as needed   Best Practice (right click and "Reselect all SmartList Selections" daily)   Diet/type: NPO DVT prophylaxis SCD Pressure ulcer(s): present on admission  GI prophylaxis: PPI Lines: N/A Foley:  N/A Code Status:  DNR/DNI Last date of multidisciplinary goals of care discussion completed on admission with patients son and daughter. They are aware of patients critical illness and potential for rapid decline or sudden death. They state patient is a DNR/DNI. She has recently stated (and is in office note) that she would be open to dialysis, family will discuss this further later today. All questions answered and plan discussed.   Labs   CBC: Recent Labs  Lab 09/28/23 1733 09/29/23 1903 09/30/23 0125 09/30/23 0359  WBC 18.2* 18.9*  --  18.4*  NEUTROABS  --  13.9*  --   --   HGB 11.5* 11.1* 8.1* 7.4*  HCT 34.8* 34.7* 25.1* 22.6*  MCV 86.4 89.9  --  87.6  PLT 371 445*  --  363   Basic Metabolic Panel: Recent Labs  Lab 09/28/23 1733 09/29/23 1903  09/30/23 0125  NA 130* 131* 130*  K 3.7 4.6 4.6  CL 96* 97* 101  CO2 20* 12* 9*  GLUCOSE 118* 154* 235*  BUN 109* 137* 122*  CREATININE 8.51* 9.77* 8.85*  CALCIUM 8.0* 8.1* 6.9*  MG  --   --  1.7  PHOS  --   --  6.9*   GFR: Estimated Creatinine Clearance: 4 mL/min (A) (by C-G formula based on SCr of 8.85 mg/dL (H)). Recent Labs  Lab 09/28/23 1733 09/29/23 1903 09/29/23 1903 09/29/23 2159 09/29/23 2305 09/30/23 0125 09/30/23 0359  PROCALCITON  --   --   --   --   --  3.17  --   WBC 18.2* 18.9*  --   --   --   --  18.4*  LATICACIDVEN  --  4.4*   < > >9.0* 7.1* 5.3* 4.3*   < > = values in this interval not displayed.   Liver Function Tests: Recent Labs  Lab 09/29/23 1903 09/30/23 0125  AST 37 44*  ALT 15 17  ALKPHOS 90 81  BILITOT 1.0 1.2*  PROT 7.7 5.7*  ALBUMIN 2.5* 1.9*   Recent Labs  Lab 09/29/23 1903  LIPASE 31   Coagulation Profile: Recent Labs  Lab 09/29/23 2159 09/30/23 0125 09/30/23 0359  INR >10.0* 1.8* 2.0*  Cardiac Enzymes: No results for input(s): "CKTOTAL", "CKMB", "CKMBINDEX", "TROPONINI" in the last 168 hours.  HbA1C: Hemoglobin A1C  Date/Time Value Ref Range Status  07/10/2023 11:01 AM 7.7 (A) 4.0 - 5.6 % Final  04/09/2023 11:15 AM 13.5 (A) 4.0 - 5.6 % Final   HbA1c, POC (controlled diabetic range)  Date/Time Value Ref Range Status  03/13/2018 09:46 AM 8.8 (A) 0.0 - 7.0 % Final   Hgb A1c MFr Bld  Date/Time Value Ref Range Status  11/30/2021 10:27 AM 13.5 (H) 4.8 - 5.6 % Final    Comment:    (NOTE) Pre diabetes:          5.7%-6.4%  Diabetes:              >6.4%  Glycemic control for   <7.0% adults with diabetes   05/24/2021 02:44 PM 6.6 (H) 4.8 - 5.6 % Final    Comment:    (NOTE) Pre diabetes:          5.7%-6.4%  Diabetes:              >6.4%  Glycemic control for   <7.0% adults with diabetes    CBG: Recent Labs  Lab 09/30/23 0109 09/30/23 0150 09/30/23 0403  GLUCAP 243* 207* 182*   Review of Systems:    Denies chest pain, shortness of breath, headache, new vision changes. Endorses nausea. Patient denies abdominal pain at rest, pain to palpation. Remainder of ROS reviewed and otherwise negative.  Past Medical History:  She,  has a past medical history of Anemia, Atrial fibrillation (HCC), Chronic kidney disease, Congestive heart failure (CHF) (HCC), COPD (chronic obstructive pulmonary disease) (HCC), Diabetes mellitus without complication (HCC), GERD (gastroesophageal reflux disease), Hyperlipidemia, Hypertension, Pneumonia, and Pulmonary fibrosis (HCC).   Surgical History:   Past Surgical History:  Procedure Laterality Date   ABDOMINAL HYSTERECTOMY     APPENDECTOMY     CATARACT EXTRACTION     CHOLECYSTECTOMY     HERNIA REPAIR     right knee replacement     right nephroectomy      Social History:   reports that she quit smoking about 13 months ago. Her smoking use included cigarettes. She has been exposed to tobacco smoke. She has never used smokeless tobacco. She reports that she does not drink alcohol and does not use drugs.  Family History:  Her family history includes Breast cancer in her mother; Cancer in her brother; Diabetes in her brother, father, mother, and sister; Heart disease in her father.  Allergies No Known Allergies   Home Medications  Prior to Admission medications   Medication Sig Start Date End Date Taking? Authorizing Provider  albuterol (VENTOLIN HFA) 108 (90 Base) MCG/ACT inhaler Inhale 2 puffs into the lungs every 6 (six) hours as needed for wheezing or shortness of breath. 05/23/23  Marcelino Duster, MD  amLODipine (NORVASC) 5 MG tablet Take 1 tablet (5 mg total) by mouth daily. 09/25/23   Sallyanne Kuster, NP  atorvastatin (LIPITOR) 10 MG tablet Take 1 tablet (10 mg total) by mouth every evening. 04/09/23   Sallyanne Kuster, NP  citalopram (CELEXA) 20 MG tablet Take 1 tablet (20 mg total) by mouth daily. 04/09/23   Sallyanne Kuster, NP  donepezil  (ARICEPT) 5 MG tablet Take 1 tablet (5 mg total) by mouth every morning. 04/09/23   Sallyanne Kuster, NP  ferrous sulfate 325 (65 FE) MG tablet Take 1 tablet (325 mg total) by mouth daily with breakfast. 12/30/20   Gillis Santa, MD  insulin glargine (LANTUS) 100 UNIT/ML Solostar Pen Inject 10 Units into the skin daily. 08/24/23   Enedina Finner, MD  Insulin Pen Needle (PEN NEEDLES 3/16") 31G X 5 MM MISC 1 applicator by Does not apply route 4 (four) times daily. 04/09/23   Sallyanne Kuster, NP  lidocaine (LIDODERM) 5 % Place 1 patch onto the skin daily. Remove & Discard patch within 12 hours or as directed by MD 08/25/23   Enedina Finner, MD  linaclotide [facility]) 145 MCG CAPS capsule Take 1 capsule (145 mcg total) by mouth at bedtime. 09/25/23   Abernathy, Arlyss Repress, NP  melatonin 5 MG TABS Take 5 mg by mouth at bedtime as needed (sleep).    [provider]  montelukast (SINGULAIR) 10 MG tablet Take 1 tablet (10 mg total) by mouth at bedtime. 07/10/23   Sallyanne Kuster, NP  oxyCODONE-acetaminophen (PERCOCET/ROXICET) 5-325 MG tablet Take 1 tablet by mouth every 6 (six) hours as needed for moderate pain (pain score 4-6) or severe pain (pain score 7-10). 09/25/23   Sallyanne Kuster, NP  pantoprazole (PROTONIX) 40 MG tablet Take 1 tablet (40 mg total) by mouth 2 (two) times daily. 07/10/23   Sallyanne Kuster, NP  polyethylene glycol (MIRALAX / GLYCOLAX) 17 g packet Take 17 g by mouth daily as needed for mild constipation. 07/23/23   Loyce Dys, MD  rOPINIRole (REQUIP) 0.5 MG tablet Take 1 tablet (0.5 mg total) by mouth every evening. 04/09/23   Sallyanne Kuster, NP  vitamin B-12 1000 MCG tablet Take 1 tablet (1,000 mcg total) by mouth daily. 12/30/20   Gillis Santa, MD  warfarin (COUMADIN) 2 MG tablet Take 1 tablet (2 mg total) by mouth daily at 4 PM. Take 1 tab po Tuesday ,Thursday ,Friday ad Saturday and Sunday and then take 4 mg rest of week 09/25/23   Sallyanne Kuster, NP    Critical care time: 102  minutes

## 2023-09-30 NOTE — ED Notes (Signed)
Lab called to come draw bloodwork.

## 2023-09-30 NOTE — Progress Notes (Signed)
PHARMACY CONSULT NOTE - FOLLOW UP  Pharmacy Consult for Electrolyte Monitoring and Replacement   Recent Labs: Potassium (mmol/L)  Date Value  09/30/2023 4.1  07/03/2014 3.7   Magnesium (mg/dL)  Date Value  16/07/9603 1.6 (L)   Calcium (mg/dL)  Date Value  54/06/8118 6.7 (L)   Calcium, Total (mg/dL)  Date Value  14/78/2956 8.3 (L)   Albumin (g/dL)  Date Value  21/30/8657 1.9 (L)  01/22/2023 3.6 (L)  11/16/2013 2.9 (L)   Phosphorus (mg/dL)  Date Value  84/69/6295 6.5 (H)   Sodium (mmol/L)  Date Value  09/30/2023 134 (L)  01/22/2023 139  07/03/2014 137   Corrected Ca: 8.4 mg/dL  Assessment: 82 y.o. female  who presented to the emergency department with primary concern for  weakness and dark urine   Goal of Therapy:  Electrolytes WNL  Plan:  ---4 grams IV magnesium sulfate x 1 per NP ---recheck electrolytes in am  Lowella Bandy ,PharmD Clinical Pharmacist 09/30/2023 6:53 AM

## 2023-09-30 NOTE — Consult Note (Addendum)
Urology Consult Note   Requesting Attending Physician:  Erin Fulling, MD Service Providing Consult: Urology  Consulting Attending: Jennette Bill   Reason for Consult:  Exraperitoneal bladder perforation   HPI: [name] is seen in consultation for reasons noted above at the request of Erin Fulling, MD   This is a 82 y.o. female with with a history of hypertension, HLD, CHF, A-fib on Coumadin, COPD, pulmonary fibrosis, type 2 diabetes and CKD with history of solitary kidney who presented after generalized weakness for the past 5 days as well as blood in the urine and abdominal pain.  Upon admission the patient's creatinine was 9.77, a lactic acid of 9, WBC of 18.9 as well as tachycardia and hypotension.  She was placed on broad-spectrum antibiotics and norepinephrine for pressors.  CT was done which demonstrated a possible perforation of the anterior bladder into the space of Retzius.  Repeat CT cystogram demonstrates no intraperitoneal perforation.  Patient denies any trauma or falls recently except for the recent fall in October with right hip fracture CT at that time demonstrated no bladder abnormalities.  Review of patient's recent history includes a recent fall in October which resulted in right femur fracture CT at that time demonstrated no abnormalities the bladder.  She was then seen by Dr. Richardo Hanks for urinary retention catheter was left in the time.  The patient was then transferred to a SNF with a catheter was removed.  Patient CKD was managed by [facility] nephrology in the past.  Review of OSH records demonstrate the patient had an A1c of 13 and June 2024.  Patient also has a history of CKD after right nephrectomy for recurrent infections.       Past Medical History: Past Medical History:  Diagnosis Date   Anemia    Atrial fibrillation (HCC)    Chronic kidney disease    Congestive heart failure (CHF) (HCC)    COPD (chronic obstructive pulmonary disease) (HCC)    Diabetes  mellitus without complication (HCC)    GERD (gastroesophageal reflux disease)    Hyperlipidemia    Hypertension    Pneumonia    Pulmonary fibrosis (HCC)     Past Surgical History:  Past Surgical History:  Procedure Laterality Date   ABDOMINAL HYSTERECTOMY     APPENDECTOMY     CATARACT EXTRACTION     CHOLECYSTECTOMY     HERNIA REPAIR     right knee replacement     right nephroectomy      Medication: Current Facility-Administered Medications  Medication Dose Route Frequency Provider Last Rate Last Admin   Chlorhexidine Gluconate Cloth 2 % PADS 6 each  6 each Topical Daily Dahlia Byes, NP   6 each at 09/30/23 0515   docusate sodium (COLACE) capsule 100 mg  100 mg Oral BID PRN Dahlia Byes, NP       hydrocortisone sodium succinate (SOLU-CORTEF) 100 MG injection 100 mg  100 mg Intravenous Q12H Dahlia Byes, NP   100 mg at 09/30/23 0409   HYDROmorphone (DILAUDID) injection 0.5 mg  0.5 mg Intravenous Q1H PRN Erin Fulling, MD   0.5 mg at 09/30/23 0817   insulin aspart (novoLOG) injection 0-15 Units  0-15 Units Subcutaneous Q4H Dahlia Byes, NP   2 Units at 09/30/23 1339   norepinephrine (LEVOPHED) 4mg  in (0.016 mg/mL) premix infusion  0-40 mcg/min Intravenous Continuous Dahlia Byes, NP 26.3 mL/hr at 09/30/23 1352 7 mcg/min at 09/30/23 1352   ondansetron (ZOFRAN) injection 4 mg  4 mg Intravenous  Q6H PRN Dahlia Byes, NP   4 mg at 09/30/23 0205   Oral care mouth rinse  15 mL Mouth Rinse PRN Dahlia Byes, NP       pantoprazole (PROTONIX) injection 40 mg  40 mg Intravenous Q12H Gaetana Michaelis, MD   40 mg at 09/30/23 1108   piperacillin-tazobactam (ZOSYN) IVPB 2.25 g  2.25 g Intravenous Q8H Lowella Bandy, RPH 100 mL/hr at 09/30/23 1410 2.25 g at 09/30/23 1410   polyethylene glycol (MIRALAX / GLYCOLAX) packet 17 g  17 g Oral Daily PRN Dahlia Byes, NP       sodium bicarbonate 150 mEq in dextrose 5 % 1,150 mL infusion   Intravenous Continuous Kolluru,  Sarath, MD 75 mL/hr at 09/30/23 1352 Infusion Verify at 09/30/23 1352   vancomycin variable dose per unstable renal function (pharmacist dosing)   Does not apply See admin instructions Erin Fulling, MD        Allergies: No Known Allergies  Social History: Social History   Tobacco Use   Smoking status: Former    Current packs/day: 0.00    Types: Cigarettes    Quit date: 08/19/2022    Years since quitting: 1.1    Passive exposure: Past   Smokeless tobacco: Never   Tobacco comments:    1 pack daily---quit 1 month   Vaping Use   Vaping status: Never Used  Substance Use Topics   Alcohol use: No   Drug use: No    Family History Family History  Problem Relation Age of Onset   Diabetes Mother    Breast cancer Mother    Heart disease Father    Diabetes Father    Diabetes Sister    Diabetes Brother    Cancer Brother     Review of Systems 10 systems were reviewed and are negative except as noted specifically in the HPI.  Objective   Vital signs in last 24 hours: BP 108/71   Pulse 78   Temp 98.2 F (36.8 C) (Oral)   Resp 13   Ht 4\' 10"  (1.473 m)   Wt 68 kg   SpO2 99%   BMI 31.33 kg/m   Physical Exam General: NAD, A&O, resting, appropriate HEENT: [place]/AT, EOMI, MMM Pulmonary: Normal work of breathing Cardiovascular: HDS, adequate peripheral perfusion Abdomen: Tenderness in the suprapubic area, abdomen is soft, no rebound tenderness GU: Dark urine in the catheter draining well, no clots Most Recent Labs: Lab Results  Component Value Date   WBC 18.4 (H) 09/30/2023   HGB 7.1 (L) 09/30/2023   HCT 20.8 (L) 09/30/2023   PLT 363 09/30/2023    Lab Results  Component Value Date   NA 134 (L) 09/30/2023   K 4.1 09/30/2023   CL 100 09/30/2023   CO2 12 (L) 09/30/2023   BUN 124 (H) 09/30/2023   CREATININE 8.95 (H) 09/30/2023   CALCIUM 6.7 (L) 09/30/2023   MG 1.6 (L) 09/30/2023   PHOS 6.5 (H) 09/30/2023    Lab Results  Component Value Date   INR 2.0 (H)  09/30/2023   APTT 39 (H) 07/22/2023     Urine Culture: @[id number] (laburin,org,[id number])@   IMAGING: CT CYSTOGRAM ABD/PELVIS  Result Date: 09/30/2023 CLINICAL DATA:  Abdominal pain.  Possible bladder leak. EXAM: CT CYSTOGRAM (CT ABDOMEN AND PELVIS WITH CONTRAST) TECHNIQUE: Multi-detector CT imaging through the abdomen and pelvis was performed after dilute contrast had been introduced into the bladder for the purposes of performing CT cystography. RADIATION DOSE REDUCTION: This exam was  performed according to the departmental dose-optimization program which includes automated exposure control, adjustment of the mA and/or kV according to patient size and/or use of iterative reconstruction technique. CONTRAST:  Iodinated contrast instilled through the indwelling catheter into the bladder. No intravenous contrast. COMPARISON:  CT abdomen pelvis obtained earlier today at 8:53 a.m. FINDINGS: Lower chest: No acute abnormality. Hepatobiliary: Unremarkable liver. Dilated common bile duct. Post cholecystectomy. No change from the earlier exam. Pancreas: No mass or inflammation.  No duct dilation. Spleen: Unremarkable. Adrenals/Urinary Tract: No adrenal mass. Status post right nephrectomy. No left renal mass. Mild dilation of the left intrarenal collecting system and portions of the left ureter. No intrarenal or ureteral stone. Dense contrast has been instilled into the bladder. Contrast extends anterior to the bladder with surrounding inflammatory stranding and haziness as well as a small amount of non dependent air. However, there is no free leakage of contrast into the peritoneal cavity. Contrast, air and inflammatory changes appear confined to the space of Retzius. Stomach/Bowel: No acute findings or change from the earlier study. Vascular/Lymphatic: Aortic atherosclerotic calcifications. Normal caliber aorta. No enlarged lymph nodes. Reproductive: Status post hysterectomy. No adnexal masses. Other: Small  amount of pelvic free fluid. No free intraperitoneal air. Musculoskeletal: No fracture or acute finding. IMPRESSION: 1. CT cystogram findings are consistent with disruption along the anterior superior wall of the bladder. The bladder leakage is contained. Contrast, inflammatory changes and a small amount of air extend anterior to the bladder in the Space of Retzius. However, there is no free leakage of contrast into the peritoneal cavity. 2. No other acute findings. 3. Aortic Atherosclerosis (ICD10-I70.0). Aortic Atherosclerosis (ICD10-I70.0). Electronically Signed   By: Amie Portland M.D.   On: 09/30/2023 15:54   CT ABDOMEN PELVIS WO CONTRAST  Result Date: 09/30/2023 CLINICAL DATA:  Bowel obstruction suspected. Abnormal labs. Shortness of breath. EXAM: CT ABDOMEN AND PELVIS WITHOUT CONTRAST TECHNIQUE: Multidetector CT imaging of the abdomen and pelvis was performed following the standard protocol without IV contrast. RADIATION DOSE REDUCTION: This exam was performed according to the departmental dose-optimization program which includes automated exposure control, adjustment of the mA and/or kV according to patient size and/or use of iterative reconstruction technique. COMPARISON:  08/14/2023 FINDINGS: Lower chest: Emphysema. Mild cardiomegaly without pericardial or pleural effusion. Small hiatal hernia. Moderate distal esophageal wall thickening on 09/02. Hepatobiliary: Normal noncontrast appearance of the liver. Cholecystectomy. Common duct is dilated up to 1.8 cm in the porta hepatis. Mild intrahepatic duct dilatation. No obstructive stone or mass identified. Pancreas: Normal, without mass or ductal dilatation. Spleen: Normal in size, without focal abnormality. Adrenals/Urinary Tract: Normal adrenal glands. Right nephrectomy. No left renal calculi or hydronephrosis. No ureteric stone. Foley catheter within the urinary bladder. The anterior bladder dome is ill-defined on 59/2 with pericystic fluid, edema and  gas. Example 60/2. Stomach/Bowel: Normal remainder of the stomach. Normal colon and terminal ileum. Normal small bowel. Vascular/Lymphatic: Advanced aortic and branch vessel atherosclerosis. No abdominopelvic adenopathy. Reproductive: Hysterectomy.  No adnexal mass. Other: None. Musculoskeletal: Previously described greater trochanteric right femur fracture is more well-defined today with a component of comminution including on coronal image 57. Thoracic spondylosis. IMPRESSION: 1. Findings most consistent with bladder rupture as evidenced by ill-defined anterior bladder dome wall and surrounding ill-defined pericystic fluid and gas. Favor at least primarily extraperitoneal based on location of pre vesicular fluid and gas. 2. Cholecystectomy with moderate biliary duct dilatation, similar to on the prior but possibly progressive since 05/24/2021. Consider correlation with bilirubin level  and possibly nonemergent MRCP. 3. Small hiatal hernia. Distal esophageal wall thickening suggests esophagitis. 4. Incidental findings, including: Aortic atherosclerosis (ICD10-I70.0) and emphysema (ICD10-J43.9). These results will be called to the ordering clinician or representative by the Radiologist Assistant, and communication documented in the PACS or [employer]. Electronically Signed   By: Jeronimo Greaves M.D.   On: 09/30/2023 11:11   DG Abd 1 View  Result Date: 09/30/2023 CLINICAL DATA:  Abdominal pain EXAM: ABDOMEN - 1 VIEW COMPARISON:  None Available. FINDINGS: Scattered large and small bowel gas is noted. No obstructive changes are seen. No free air is noted. Postsurgical changes and vascular calcifications are seen. No acute bony abnormality is noted. IMPRESSION: No acute abnormality noted. Electronically Signed   By: Alcide Clever M.D.   On: 09/30/2023 03:15   US RENAL  Result Date: 09/29/2023 CLINICAL DATA:  Acute kidney injury EXAM: RENAL / URINARY TRACT ULTRASOUND COMPLETE COMPARISON:  CT abdomen/pelvis dated  08/14/2023 FINDINGS: Right Kidney: Surgically absent. Left Kidney: Renal measurements: 9.9 x 5.9 x 6.1 cm = volume: 185 mL. Echogenicity within normal limits. No mass or hydronephrosis visualized. Bladder: Poorly visualized/underdistended. Other: None. IMPRESSION: Surgically absent right kidney. Normal left kidney. Electronically Signed   By: Charline Bills M.D.   On: 09/29/2023 21:38   DG Chest 2 View  Result Date: 09/29/2023 CLINICAL DATA:  Shortness of breath EXAM: CHEST - 2 VIEW COMPARISON:  09/28/2023 FINDINGS: Heart and mediastinal contours are within normal limits. Peripheral interstitial opacities noted compatible with scarring. No acute focal opacities or effusions. No acute bony abnormality. Aortic atherosclerosis. IMPRESSION: No acute cardiopulmonary disease. Electronically Signed   By: Charlett Nose M.D.   On: 09/29/2023 19:27   DG Chest 2 View  Result Date: 09/28/2023 CLINICAL DATA:  Weakness EXAM: CHEST - 2 VIEW COMPARISON:  Chest x-ray 08/14/2023 FINDINGS: Pneumothorax or effusion. Enlarged cardiopericardial silhouette with some interstitial changes noted bilaterally. Calcified aorta. There are some calcifications along the tracheobronchial tree. No consolidation, pneumothorax or effusion. Osteopenia. Degenerative changes. IMPRESSION: Underinflation with diffuse interstitial changes, possibly chronic. No consolidation or significant effusion today Electronically Signed   By: Karen Kays M.D.   On: 09/28/2023 18:37    ------  Assessment:  82 y.o. female with with what appears a bladder perforation in the space of Retzius with no intraperitoneal fluid noted on CT cystogram.  Patient also believed to be in acute renal failure, and have a lactic acid of greater than 9 on admission, INR was also noted to be greater than 10. Patient has a nontraumatic perforation of the anterior bladder wall into the space of Retzius with unclear cause.  Discussed with family that there is possibility that  elevated creatinine is possibly due to extraperitoneal leak of urine with body reabsorbing creatinine.  We discussed repair of the injury and stated that due to her history of severe diabetes, poor kidney function as well is the fact that this is a nontraumatic extraperitoneal bladder perforation patient likely has very poor tissue that would not heal well.  I explained to the family that repair carries many risks including poor wound healing, unclear how healthy bladder tissue is contributing to poor bladder closure, and high risk of wound complication secondary to her diabetes, also the possible need for nephrostomy tubes depending on adequacy of wound closure.  When I first entered the room the family had already refused dialysis offered by the ICU team prior to my arrival and were already discussing comfort care measures due to  concerns about her health.  Upon my entry the family was discussing comfort measures as they already did refuse dialysis that was offered by the ICU.  I explained all the aspects of surgery to the family member present and they wanted to discuss with the whole family comfort care measures prior to making decision.  IN majority of cases would manage extraperitoneal bladder injury with bladder drainage and abx to see if kidney function improves if wants to avoid comfort care would recommend this prior to pursuing any surgery.   Recommendations: Continue catheter -If family decides against comfort care please contact urology   Thank you for this consult. Please contact the urology consult pager with any further questions/concerns.

## 2023-09-30 NOTE — Progress Notes (Signed)
Pharmacy Antibiotic Note  [name] is a 82 y.o. female admitted on 09/29/2023 with sepsis, AKI, hematuria.  Pharmacy has been consulted for Vancomycin, cefepime dosing.  Plan: Cefepime 2 gm IV X 1 given in ED on 12/7 @ 2035. Cefepime 1 gm IV Q24H ordered to start on 12/8 @ 2030.  Vancomycin 1250 mg IV X 1 given on 12/7 @ 2205. - will dose by levels until renal function stable (baseline SrCr = ~ 3.5, current SrCr= 9.8. - will check vanc level on 12/8 @ 2200 - goal trough:  15 - 20 mcg/mL      Temp (24hrs), Avg:97.5 F (36.4 C), Min:97.1 F (36.2 C), Max:97.8 F (36.6 C)  Recent Labs  Lab 09/28/23 1733 09/29/23 1903 09/29/23 2159 09/29/23 2305 09/30/23 0125  WBC 18.2* 18.9*  --   --   --   CREATININE 8.51* 9.77*  --   --   --   LATICACIDVEN  --  4.4* >9.0* 7.1* 5.3*    Estimated Creatinine Clearance: 3.7 mL/min (A) (by C-G formula based on SCr of 9.77 mg/dL (H)).    No Known Allergies  Antimicrobials this admission:   >>    >>   Dose adjustments this admission:   Microbiology results:  BCx:   UCx:    Sputum:    MRSA PCR:   Thank you for allowing pharmacy to be a part of this patient's care.  Shamecka Hocutt D 09/30/2023 2:11 AM

## 2023-09-30 NOTE — Plan of Care (Signed)
  Problem: Clinical Measurements: Goal: Diagnostic test results will improve Outcome: Progressing Goal: Respiratory complications will improve Outcome: Progressing Goal: Cardiovascular complication will be avoided Outcome: Progressing   Problem: Pain Management: Goal: General experience of comfort will improve Outcome: Progressing   Problem: Skin Integrity: Goal: Risk for impaired skin integrity will decrease Outcome: Progressing   Problem: Skin Integrity: Goal: Risk for impaired skin integrity will decrease Outcome: Progressing

## 2023-09-30 NOTE — Progress Notes (Addendum)
eLink Physician-Brief Progress Note Patient Name: [name] DOB: 1941-04-15 MRN: [id number]   Date of Service  09/30/2023  HPI/Events of Note  82/F who presents to the ED with generalized weakness. Symptoms have been progressing over the past 5 days and was associated with anorexi and dark-colored urine. She had initially presented to the ED yesterday, but had left before seeing the lab results. Today she was contacted by nursing staff after she was noted to have a creatinine of 8.5. Labs also significant for bicarb of 12, anion gap of 21,  lactate 7.1, and a leukocytosis of 18.   eICU Interventions  AKI on CKD - ?from poor PO intake, decreased renal perfusion - Started on IVF resuscitation. Will monitor I/Os, daily weights - Hold antihypertensive medications for now. Target MAP >65 - Renally dose medications, avoid nephrotoxins.  - Follow serial BMP, would address abnormalities as warranted.  - Nephrology eval   Severe sepsis - Pt started on empiric vancomycin, cefepime for UTI.  - Will follow cultures, deescalate as warranted.  - Continue IVF. Monitor I/Os, daily weights - Would plan to start levophed if with worsening hypotension.  - Plan for CT abdomen+pelvis.   Coagulopathy - Pt reported to have dark stools in the ED.  - Coumadin reversed with K centra and vitamin K.  - Follow serial coags.  - Serial H/H, transfuse pRBC to maitnain Hgb >7 or if with signs of large ongoing bleed - Started on PPI BID for possible GI bleed.   DVT ppx - SCDs   Code status - DNR-Limited        Freemon Binford M DELA CRUZ 09/30/2023, 1:12 AM

## 2023-09-30 NOTE — Plan of Care (Signed)

## 2023-09-30 NOTE — IPAL (Signed)
  Interdisciplinary Goals of Care Family Meeting   Date carried out: 09/30/2023  Location of the meeting: Bedside  Member's involved: Physician, Bedside Registered Nurse, and Family Member or next of kin   GOALS OF CARE DISCUSSION  The Clinical status was relayed to family in detail- 2 sisters at bedside, One sister over the phone  Updated and notified of patients medical condition-  Explained to family course of therapy and the modalities   Patient with Progressive multiorgan failure with a very high probablity of a very minimal chance of meaningful recovery despite all aggressive and optimal medical therapy.    Family understands the situation Progressive renal failure and possible bladder rupture Patient with very poor quality of life   They have consented and agreed to DNR/DNI and would like to proceed with Comfort care measures.  Family are satisfied with Plan of action and management. All questions answered  Additional CC time 25 mins   [name] Santiago Glad, M.D.  [facility]  Medical Director [facility] Medical Director [facility] Cardio-Pulmonary Department

## 2023-10-01 NOTE — Progress Notes (Signed)
NAME:  [name], MRN:  [id number], DOB:  1941/06/09, LOS: 1 ADMISSION DATE:  09/29/2023, CONSULTATION DATE:  09/30/2023 REFERRING MD:  Dr. Elesa Massed, CHIEF COMPLAINT:  Sepsis   Brief Pt Description / Synopsis:  82 y.o. female admitted with Acute Metabolic Encephalopathy and Severe Sepsis with Septic Shock in the setting UTI and Nontraumatic Extraperitoneal Bladder perforation.  Pt and family have elected to transition to COMFORT MEASURES ONLY.  History of Present Illness:  [name] is an 82 year old female with a PMH significant for HTN, HLD, CHF, Afib on coumadin (no rate control due to chronic bradycardia), COPD, Pulmonary fibrosis, Diabetes type 2 insulin dependent, CKD stage IV s/p Right Nephrectomy, Anemia, GERD, and recent Right femur fracture (treated medically) that presented 09/29/2023 with Generalized weakness progressing for past 5 days. According to patient and family patient had a fall 10/22 with resultant hip fracture requiring hospitalization 10/22-11/10/2022 followed by rehab at [facility] for 4 weeks. Over that past 5 days patient has not been eating or drinking much, increased fatigue, no cough, fevers, or chills. Daughter states patient has had bloody urine and constipation. 12/6 patient went to the ER and labs sent, however left due to the wait time, patient left. They revealed worsening renal function BUN 137/creat 9.77 (baseline creat 2.4, BUN 35 over past 6 months). Patient was notified today of these results and presented to the ER were labs confirmed worsening function BUN 137, creat 9.77, WBC 18.9, Lactic acid 9.0, INR > 10.    ED course: Patient went into Afib RVR shortly after arrival to the ER. Patient was given 3 liters IVF for resuscitation. K-centra and Vitamin K was administered for reversal of her INR > 10, repeat now 1.8. Cultures sent, UA non diagnostic due to gross hematuria and culture pending, CXR without evidence of acute cardiopulmonary process, Renal US  without hydronephrosis or mass (surgically absent right kidney). With ongoing hypotension patient was started on Levophed infusion and admitted to the Medical ICU for continuation of care.    Medications given: Cefepime, Vancomycin, 3 Liters IVF, Kcentra, Vitamin K, Levophed infusion Initial Vitals: Temp 97.1, BP 85/71, HR 78, RR 22 Significant labs: (Labs/ Imaging personally reviewed) EKG Interpretation: Date: 09/29/2023 EKG Time: 1851 Rate: 153 Rhythm: Afib RVR, S/T Wave abnormalities, Narrative Interpretation: Afib w/ RVR, prolonged Qtc 514 Chemistry: Na+:131, K+: 4.6, BUN/Cr.: 137/9.77, Serum CO2/ AG: 12/22 Hematology: WBC: 18.9, Hgb: 11.1, Plts 445, INR > 10< PT >90, Lactic >9, Covid and Influenza A/B pending CXR: No acute cardiopulmonary process CT Abd/Pelvis and KUB pending   PCCM consulted for admission due to Septic Shock requiring Vasopressors.   Please see "Significant Hospital Events" section below for full detailed hospital course.   Pertinent  Medical History  -pAFib on coumadin -CHF -Acute Hypotension -CKD stage IV (Solitary Left Kidney) -COPD/Pulmonary fibrosis -Diabetes type 2 insulin dependent  Micro Data:  12/7: Urine>> 12/8: Blood culture x2>>no growth to date 12/8: MRSA PCR>>negative 12/8: RVP>>negative  Antimicrobials:   Anti-infectives (From admission, onward)    Start     Dose/Rate Route Frequency Ordered Stop   09/30/23 2030  ceFEPIme (MAXIPIME) 1 g in sodium chloride 0.9 % 100 mL IVPB  Status:  Discontinued        1 g 200 mL/hr over 30 Minutes Intravenous Every 24 hours 09/30/23 0116 09/30/23 0449   09/30/23 1400  piperacillin-tazobactam (ZOSYN) IVPB 2.25 g  Status:  Discontinued       Note to Pharmacy: Renally dose  please. Concern for potential intra-abdominal source   2.25 g 100 mL/hr over 30 Minutes Intravenous Every 8 hours 09/30/23 1155 09/30/23 1906   09/30/23 0600  piperacillin-tazobactam (ZOSYN) IVPB 2.25 g  Status:  Discontinued       Note  to Pharmacy: Renally dose please. Concern for potential intra-abdominal source   2.25 g 100 mL/hr over 30 Minutes Intravenous Every 6 hours 09/30/23 0449 09/30/23 1155   09/30/23 0215  vancomycin (VANCOCIN) IVPB 1000 mg/200 mL premix  Status:  Discontinued       Placed in "Followed by" Linked Group   1,000 mg 200 mL/hr over 60 Minutes Intravenous  Once 09/30/23 0118 09/30/23 0210   09/30/23 0215  vancomycin (VANCOREADY) IVPB 500 mg/100 mL  Status:  Discontinued       Placed in "Followed by" Linked Group   500 mg 100 mL/hr over 60 Minutes Intravenous  Once 09/30/23 0118 09/30/23 0210   09/30/23 0200  ceFEPIme (MAXIPIME) 2 g in sodium chloride 0.9 % 100 mL IVPB  Status:  Discontinued       Note to Pharmacy: Please renally adjust dose. Thank you!   2 g 200 mL/hr over 30 Minutes Intravenous Every 8 hours 09/30/23 0109 09/30/23 0116   09/30/23 0121  vancomycin variable dose per unstable renal function (pharmacist dosing)  Status:  Discontinued         Does not apply See admin instructions 09/30/23 0121 09/30/23 1906   09/29/23 2030  vancomycin (VANCOREADY) IVPB 1500 mg/300 mL  Status:  Discontinued        1,500 mg 150 mL/hr over 120 Minutes Intravenous Every 24 hours 09/29/23 1946 09/29/23 1953   09/29/23 2000  vancomycin (VANCOREADY) IVPB 1250 mg/250 mL  Status:  Discontinued        1,250 mg 166.7 mL/hr over 90 Minutes Intravenous Every 24 hours 09/29/23 1953 09/30/23 0118   09/29/23 1945  ceFEPIme (MAXIPIME) 2 g in sodium chloride 0.9 % 100 mL IVPB        2 g 200 mL/hr over 30 Minutes Intravenous  Once 09/29/23 1944 09/29/23 2249   09/29/23 1945  vancomycin (VANCOCIN) IVPB 1000 mg/200 mL premix  Status:  Discontinued        1,000 mg 200 mL/hr over 60 Minutes Intravenous  Once 09/29/23 1944 09/29/23 1946        Significant Hospital Events: Including procedures, antibiotic start and stop dates in addition to other pertinent events   12/8: admitted to ICU with Septic shock of unclear  etiology. 3 Liters IVF, Levophed, Cultures, antibiotics, Bicarbonate push and drip, INR > 10, K-centra and Vitamin K. Afib RVR, metoprolol 2.5 mg x 1. Concern for GIB due coagulopathy, trend H/H. Nephrology consulted and Urology consulted. DNR/DNI with aggressive care. 12/8: Family refused dialysis, felt not to be a good surgical candidate bladder repair. Later in the evening pt's family elected to transition to COMFORT MEASURES. 12/9: Awake, alert and interactive, no complaints, Wants to drink and eat. Remains on comfort measures.  Interim History / Subjective:  As outlined above  Objective   Blood pressure (!) 111/42, pulse 99, temperature 98 F (36.7 C), temperature source Oral, resp. rate 20, height 4\' 10"  (1.473 m), weight 68 kg, SpO2 95%.        Intake/Output Summary (Last 24 hours) at 10/01/2023 1105 Last data filed at 10/01/2023 0600 Gross per 24 hour  Intake 1295.93 ml  Output 1000 ml  Net 295.93 ml   Filed Weights   09/30/23 0200  Weight: 68 kg    Examination: General: Acute on chronically ill-appearing female, sitting in bed, on room air, no acute distress HENT: Atraumatic, normocephalic, neck supple, no JVD Lungs: Clear breath sounds throughout, even, nonlabored Cardiovascular: Tachycardia, irregular irregular rhythm Abdomen: Soft, nontender, nondistended, no guarding rebound tenderness Extremities: Generalized weakness Neuro: Awake and alert, oriented to person GU: Foley catheter in place draining amber-colored urine  Resolved Hospital Problem list     Assessment & Plan:   NOW ON COMFORT MEASURES  #Septic Shock #Atrial Fibrillation with RVR #Acute on Chronic HFpEF PMHx: A. Fib with Coumadin -Continuous cardiac monitoring -Maintain MAP >65 -Cautious IV fluids -Vasopressors as needed to maintain MAP goal -Trend lactic acid until normalized -Trend HS Troponin until peaked -Echocardiogram pending -Diuresis as BP and renal function permits  #Severe Sepsis  due to UTI and Nontraumatic Extraperitoneal Bladder perforation CT Abdomen & Pelvis with concern for extraperitoneal bladder rupture, cholecystectomy with moderate biliary duct dilatation, small hiatal hernia, and esophagitis -Monitor fever curve -Trend WBC's & Procalcitonin -Follow cultures as above -No longer treating infection as pt on CMO -Urology consulted, appreciate input ~ felt to be poor surgical candidate, patient's family ultimately elected NOT to pursue surgery  #AKI on CKD Stage IV (right nephrectomy 1970's) Baseline creat 2.4, BUN 35 #Metabolic Acidosis Renal US with normal left kidney (surgically absent right kidney) -Monitor I&O's / urinary output -Follow BMP -Ensure adequate renal perfusion -Avoid nephrotoxic agents as able -Replace electrolytes as indicated ~ Pharmacy following for assistance with electrolyte replacement -Nephrology consulted, appreciate input ~ pt and family refused dialysis  #Coagulopathy in the setting of INR >10 #Concern for GIB #Hematuria #Anemia of Chronic Disease PMHx: GERD Coumadin reversed with Kcentra and Vitamin K, continue to hold coumadin  -Monitor for S/Sx of bleeding -Trend CBC -SCD's for VTE Prophylaxis  -Transfuse for Hgb <7 -Urology consulted, appreciate input  #COPD without acute exacerbation -Supplemental O2 as needed to maintain O2 sats 88 to 92% -Pt is DNR/DNI -Follow intermittent Chest X-ray & ABG as needed -Bronchodilators prn -Pulmonary toilet as able  #Type 2 Diabetes Mellitus, with long-term current use of insulin  -CBG's q4h; Target range of 140 to 180 -SSI -Follow ICU Hypo/Hyperglycemia protocol  #Closed Nondisplaced Fracture of Greater Trochanter of Right Femur, Medical management  S/p fall 08/14/2023, non surgical management - Order PT/OT once appropriate - Pain control as needed      Best Practice (right click and "Reselect all SmartList Selections" daily)   Diet/type: Regular consistency (see  orders) DVT prophylaxis: not indicated GI prophylaxis: N/A Lines: N/A Foley:  N/A Code Status:  DNR Last date of multidisciplinary goals of care discussion [12/9]  12/9: Pt's daughter Rosey Bath updated at bedside on plan of care and that family wishes to continue with comfort focus.  They are pleased that she is awake, alert and interactive this morning, enjoying time with her.  Did discuss that since we are no longer treating pathologies, anticipate at some point she will worsen and decline.  Labs   CBC: Recent Labs  Lab 09/28/23 1733 09/29/23 1903 09/30/23 0125 09/30/23 0359 09/30/23 1055 09/30/23 1615  WBC 18.2* 18.9*  --  18.4*  --   --   NEUTROABS  --  13.9*  --   --   --   --   HGB 11.5* 11.1* 8.1* 7.4* 7.1* 6.3*  HCT 34.8* 34.7* 25.1* 22.6* 20.8* 17.3*  MCV 86.4 89.9  --  87.6  --   --   PLT 371  445*  --  363  --   --     Basic Metabolic Panel: Recent Labs  Lab 09/28/23 1733 09/29/23 1903 09/30/23 0125 09/30/23 0359 09/30/23 1615  NA 130* 131* 130* 134* 136  K 3.7 4.6 4.6 4.1 3.7  CL 96* 97* 101 100 95*  CO2 20* 12* 9* 12* 23  GLUCOSE 118* 154* 235* 189* 161*  BUN 109* 137* 122* 124* 124*  CREATININE 8.51* 9.77* 8.85* 8.95* 8.31*  CALCIUM 8.0* 8.1* 6.9* 6.7* 7.1*  MG  --   --  1.7 1.6*  --   PHOS  --   --  6.9* 6.5*  --    GFR: Estimated Creatinine Clearance: 4.3 mL/min (A) (by C-G formula based on SCr of 8.31 mg/dL (H)). Recent Labs  Lab 09/28/23 1733 09/29/23 1903 09/29/23 2159 09/29/23 2305 09/30/23 0125 09/30/23 0359 09/30/23 1055  PROCALCITON  --   --   --   --  3.17  --   --   WBC 18.2* 18.9*  --   --   --  18.4*  --   LATICACIDVEN  --  4.4*   < > 7.1* 5.3* 4.3* 1.6   < > = values in this interval not displayed.    Liver Function Tests: Recent Labs  Lab 09/29/23 1903 09/30/23 0125  AST 37 44*  ALT 15 17  ALKPHOS 90 81  BILITOT 1.0 1.2*  PROT 7.7 5.7*  ALBUMIN 2.5* 1.9*   Recent Labs  Lab 09/29/23 1903  LIPASE 31   No results  for input(s): "AMMONIA" in the last 168 hours.  ABG    Component Value Date/Time   PHART 7.54 (H) 09/30/2023 0751   PCO2ART 32 09/30/2023 0751   PO2ART 95 09/30/2023 0751   HCO3 27.4 09/30/2023 0751   O2SAT 99.3 09/30/2023 0751     Coagulation Profile: Recent Labs  Lab 09/29/23 2159 09/30/23 0125 09/30/23 0359  INR >10.0* 1.8* 2.0*    Cardiac Enzymes: No results for input(s): "CKTOTAL", "CKMB", "CKMBINDEX", "TROPONINI" in the last 168 hours.  HbA1C: Hemoglobin A1C  Date/Time Value Ref Range Status  07/10/2023 11:01 AM 7.7 (A) 4.0 - 5.6 % Final  04/09/2023 11:15 AM 13.5 (A) 4.0 - 5.6 % Final   HbA1c, POC (controlled diabetic range)  Date/Time Value Ref Range Status  03/13/2018 09:46 AM 8.8 (A) 0.0 - 7.0 % Final   Hgb A1c MFr Bld  Date/Time Value Ref Range Status  11/30/2021 10:27 AM 13.5 (H) 4.8 - 5.6 % Final    Comment:    (NOTE) Pre diabetes:          5.7%-6.4%  Diabetes:              >6.4%  Glycemic control for   <7.0% adults with diabetes   05/24/2021 02:44 PM 6.6 (H) 4.8 - 5.6 % Final    Comment:    (NOTE) Pre diabetes:          5.7%-6.4%  Diabetes:              >6.4%  Glycemic control for   <7.0% adults with diabetes     CBG: Recent Labs  Lab 09/30/23 0403 09/30/23 0746 09/30/23 1148 09/30/23 1555 09/30/23 1941  GLUCAP 182* 157* 122* 164* 164*    Review of Systems:   Positives in BOLD: Currently denies all complaints Gen: Denies fever, chills, weight change, fatigue, night sweats HEENT: Denies blurred vision, double vision, hearing loss, tinnitus, sinus congestion, rhinorrhea, sore throat,  neck stiffness, dysphagia PULM: Denies shortness of breath, cough, sputum production, hemoptysis, wheezing CV: Denies chest pain, edema, orthopnea, paroxysmal nocturnal dyspnea, palpitations GI: Denies abdominal pain, nausea, vomiting, diarrhea, hematochezia, melena, constipation, change in bowel habits GU: Denies dysuria, hematuria, polyuria,  oliguria, urethral discharge Endocrine: Denies hot or cold intolerance, polyuria, polyphagia or appetite change Derm: Denies rash, dry skin, scaling or peeling skin change Heme: Denies easy bruising, bleeding, bleeding gums Neuro: Denies headache, numbness, weakness, slurred speech, loss of memory or consciousness   Past Medical History:  She,  has a past medical history of Anemia, Atrial fibrillation (HCC), Chronic kidney disease, Congestive heart failure (CHF) (HCC), COPD (chronic obstructive pulmonary disease) (HCC), Diabetes mellitus without complication (HCC), GERD (gastroesophageal reflux disease), Hyperlipidemia, Hypertension, Pneumonia, and Pulmonary fibrosis (HCC).   Surgical History:   Past Surgical History:  Procedure Laterality Date   ABDOMINAL HYSTERECTOMY     APPENDECTOMY     CATARACT EXTRACTION     CHOLECYSTECTOMY     HERNIA REPAIR     right knee replacement     right nephroectomy       Social History:   reports that she quit smoking about 13 months ago. Her smoking use included cigarettes. She has been exposed to tobacco smoke. She has never used smokeless tobacco. She reports that she does not drink alcohol and does not use drugs.   Family History:  Her family history includes Breast cancer in her mother; Cancer in her brother; Diabetes in her brother, father, mother, and sister; Heart disease in her father.   Allergies No Known Allergies   Home Medications  Prior to Admission medications   Medication Sig Start Date End Date Taking? Authorizing Provider  albuterol (VENTOLIN HFA) 108 (90 Base) MCG/ACT inhaler Inhale 2 puffs into the lungs every 6 (six) hours as needed for wheezing or shortness of breath. 05/23/23   Marcelino Duster, MD  amLODipine (NORVASC) 5 MG tablet Take 1 tablet (5 mg total) by mouth daily. 09/25/23   Sallyanne Kuster, NP  atorvastatin (LIPITOR) 10 MG tablet Take 1 tablet (10 mg total) by mouth every evening. 04/09/23   Sallyanne Kuster,  NP  citalopram (CELEXA) 20 MG tablet Take 1 tablet (20 mg total) by mouth daily. 04/09/23   Sallyanne Kuster, NP  donepezil (ARICEPT) 5 MG tablet Take 1 tablet (5 mg total) by mouth every morning. 04/09/23   Sallyanne Kuster, NP  ferrous sulfate 325 (65 FE) MG tablet Take 1 tablet (325 mg total) by mouth daily with breakfast. 12/30/20   Gillis Santa, MD  insulin glargine (LANTUS) 100 UNIT/ML Solostar Pen Inject 10 Units into the skin daily. 08/24/23   Enedina Finner, MD  Insulin Pen Needle (PEN NEEDLES 3/16") 31G X 5 MM MISC 1 applicator by Does not apply route 4 (four) times daily. 04/09/23   Sallyanne Kuster, NP  lidocaine (LIDODERM) 5 % Place 1 patch onto the skin daily. Remove & Discard patch within 12 hours or as directed by MD 08/25/23   Enedina Finner, MD  linaclotide [facility]) 145 MCG CAPS capsule Take 1 capsule (145 mcg total) by mouth at bedtime. 09/25/23   Abernathy, Arlyss Repress, NP  melatonin 5 MG TABS Take 5 mg by mouth at bedtime as needed (sleep).    [provider]  montelukast (SINGULAIR) 10 MG tablet Take 1 tablet (10 mg total) by mouth at bedtime. 07/10/23   Sallyanne Kuster, NP  oxyCODONE-acetaminophen (PERCOCET/ROXICET) 5-325 MG tablet Take 1 tablet by mouth every 6 (six) hours as  needed for moderate pain (pain score 4-6) or severe pain (pain score 7-10). 09/25/23   Sallyanne Kuster, NP  pantoprazole (PROTONIX) 40 MG tablet Take 1 tablet (40 mg total) by mouth 2 (two) times daily. 07/10/23   Sallyanne Kuster, NP  polyethylene glycol (MIRALAX / GLYCOLAX) 17 g packet Take 17 g by mouth daily as needed for mild constipation. 07/23/23   Loyce Dys, MD  rOPINIRole (REQUIP) 0.5 MG tablet Take 1 tablet (0.5 mg total) by mouth every evening. 04/09/23   Sallyanne Kuster, NP  vitamin B-12 1000 MCG tablet Take 1 tablet (1,000 mcg total) by mouth daily. 12/30/20   Gillis Santa, MD  warfarin (COUMADIN) 2 MG tablet Take 1 tablet (2 mg total) by mouth daily at 4 PM. Take 1 tab po Tuesday ,Thursday  ,Friday ad Saturday and Sunday and then take 4 mg rest of week 09/25/23   Sallyanne Kuster, NP     Care time: 35 minutes     Harlon Ditty, AGACNP-BC [facility] Prefer epic messenger for cross cover needs If after hours, please call E-link

## 2023-10-01 NOTE — Progress Notes (Signed)
Patient placed on comfort measures today. She is resting comfortably with family at the bedside. PRN medication available for pain/comfort if needed. This RN will continue to assess for patient needs during care of the patient this shift.  Carmel Sacramento, RN

## 2023-10-01 NOTE — Plan of Care (Signed)
  Problem: Education: Goal: Knowledge of General Education information will improve Description: Including pain rating scale, medication(s)/side effects and non-pharmacologic comfort measures 10/01/2023 1641 by Enid Cutter, RN Outcome: Not Progressing 10/01/2023 1641 by Enid Cutter, RN Outcome: Not Progressing   Problem: Health Behavior/Discharge Planning: Goal: Ability to manage health-related needs will improve 10/01/2023 1641 by Enid Cutter, RN Outcome: Not Progressing 10/01/2023 1641 by Enid Cutter, RN Outcome: Not Progressing   Problem: Clinical Measurements: Goal: Ability to maintain clinical measurements within normal limits will improve 10/01/2023 1641 by Enid Cutter, RN Outcome: Not Progressing 10/01/2023 1641 by Enid Cutter, RN Outcome: Not Progressing Goal: Will remain free from infection 10/01/2023 1641 by Enid Cutter, RN Outcome: Not Progressing 10/01/2023 1641 by Enid Cutter, RN Outcome: Not Progressing Goal: Diagnostic test results will improve 10/01/2023 1641 by Enid Cutter, RN Outcome: Not Progressing 10/01/2023 1641 by Enid Cutter, RN Outcome: Not Progressing Goal: Respiratory complications will improve 10/01/2023 1641 by Enid Cutter, RN Outcome: Not Progressing 10/01/2023 1641 by Enid Cutter, RN Outcome: Not Progressing Goal: Cardiovascular complication will be avoided 10/01/2023 1641 by Enid Cutter, RN Outcome: Not Progressing 10/01/2023 1641 by Enid Cutter, RN Outcome: Not Progressing   Problem: Activity: Goal: Risk for activity intolerance will decrease 10/01/2023 1641 by Enid Cutter, RN Outcome: Not Progressing 10/01/2023 1641 by Enid Cutter, RN Outcome: Not Progressing   Problem: Nutrition: Goal: Adequate nutrition will be maintained 10/01/2023 1641 by Enid Cutter, RN Outcome: Not Progressing 10/01/2023 1641 by Enid Cutter, RN Outcome: Not Progressing   Problem:  Coping: Goal: Level of anxiety will decrease 10/01/2023 1641 by Enid Cutter, RN Outcome: Not Progressing 10/01/2023 1641 by Enid Cutter, RN Outcome: Not Progressing   Problem: Pain Management: Goal: General experience of comfort will improve 10/01/2023 1641 by Enid Cutter, RN Outcome: Not Progressing 10/01/2023 1641 by Enid Cutter, RN Outcome: Not Progressing   Problem: Safety: Goal: Ability to remain free from injury will improve 10/01/2023 1641 by Enid Cutter, RN Outcome: Not Progressing 10/01/2023 1641 by Enid Cutter, RN Outcome: Not Progressing

## 2023-10-02 DIAGNOSIS — A419 Sepsis, unspecified organism: Secondary | ICD-10-CM | POA: Insufficient documentation

## 2023-10-02 DIAGNOSIS — I4891 Unspecified atrial fibrillation: Secondary | ICD-10-CM | POA: Diagnosis not present

## 2023-10-02 DIAGNOSIS — N3289 Other specified disorders of bladder: Secondary | ICD-10-CM | POA: Diagnosis not present

## 2023-10-02 DIAGNOSIS — D62 Acute posthemorrhagic anemia: Secondary | ICD-10-CM | POA: Diagnosis not present

## 2023-10-02 DIAGNOSIS — L89302 Pressure ulcer of unspecified buttock, stage 2: Secondary | ICD-10-CM

## 2023-10-02 DIAGNOSIS — R6521 Severe sepsis with septic shock: Secondary | ICD-10-CM

## 2023-10-02 DIAGNOSIS — N189 Chronic kidney disease, unspecified: Secondary | ICD-10-CM

## 2023-10-02 DIAGNOSIS — L89312 Pressure ulcer of right buttock, stage 2: Secondary | ICD-10-CM

## 2023-10-02 DIAGNOSIS — I5043 Acute on chronic combined systolic (congestive) and diastolic (congestive) heart failure: Secondary | ICD-10-CM

## 2023-10-02 LAB — BASIC METABOLIC PANEL
Anion gap: 19 — ABNORMAL HIGH (ref 5–15)
BUN: 139 mg/dL — ABNORMAL HIGH (ref 8–23)
CO2: 24 mmol/L (ref 22–32)
Calcium: 7.4 mg/dL — ABNORMAL LOW (ref 8.9–10.3)
Chloride: 93 mmol/L — ABNORMAL LOW (ref 98–111)
Creatinine, Ser: 8.69 mg/dL — ABNORMAL HIGH (ref 0.44–1.00)
GFR, Estimated: 4 mL/min — ABNORMAL LOW (ref >60)
Glucose, Bld: 221 mg/dL — ABNORMAL HIGH (ref 70–99)
Potassium: 3.2 mmol/L — ABNORMAL LOW (ref 3.5–5.1)
Sodium: 136 mmol/L (ref 135–145)

## 2023-10-02 LAB — URINE CULTURE: Culture: >=100000 — AB

## 2023-10-02 LAB — CBC
HCT: 20.2 % — ABNORMAL LOW (ref 36.0–46.0)
Hemoglobin: 6.7 g/dL — ABNORMAL LOW (ref 12.0–15.0)
MCH: 28.9 pg (ref 26.0–34.0)
MCHC: 33.2 g/dL (ref 30.0–36.0)
MCV: 87.1 fL (ref 80.0–100.0)
Platelets: 202 10*3/uL (ref 150–400)
RBC: 2.32 MIL/uL — ABNORMAL LOW (ref 3.87–5.11)
RDW: 16.2 % — ABNORMAL HIGH (ref 11.5–15.5)
WBC: 12.7 10*3/uL — ABNORMAL HIGH (ref 4.0–10.5)
nRBC: 0 % (ref 0.0–0.2)

## 2023-10-02 LAB — TYPE AND SCREEN
ABO/RH(D): O POS
Antibody Screen: NEGATIVE

## 2023-10-02 MED ORDER — HYDROMORPHONE HCL 1 MG/ML IJ SOLN
0.5000 mg | INTRAMUSCULAR | Status: DC | PRN
  Administered 2023-10-02: 0.5 mg via INTRAVENOUS
  Filled 2023-10-02: qty 1

## 2023-10-02 NOTE — Assessment & Plan Note (Addendum)
Chronic in nature heart rate a little bit above 100 today.

## 2023-10-02 NOTE — Assessment & Plan Note (Signed)
No signs of heart failure currently

## 2023-10-02 NOTE — Plan of Care (Signed)
  Problem: Education: Goal: Knowledge of General Education information will improve Description: Including pain rating scale, medication(s)/side effects and non-pharmacologic comfort measures Outcome: Not Progressing   Problem: Health Behavior/Discharge Planning: Goal: Ability to manage health-related needs will improve Outcome: Not Progressing   Problem: Clinical Measurements: Goal: Ability to maintain clinical measurements within normal limits will improve Outcome: Not Progressing Goal: Will remain free from infection Outcome: Not Progressing Goal: Diagnostic test results will improve Outcome: Not Progressing Goal: Respiratory complications will improve Outcome: Not Progressing Goal: Cardiovascular complication will be avoided Outcome: Not Progressing   Problem: Activity: Goal: Risk for activity intolerance will decrease Outcome: Not Progressing   Problem: Nutrition: Goal: Adequate nutrition will be maintained Outcome: Not Progressing   Problem: Coping: Goal: Level of anxiety will decrease Outcome: Not Progressing   Problem: Elimination: Goal: Will not experience complications related to bowel motility Outcome: Not Progressing Goal: Will not experience complications related to urinary retention Outcome: Not Progressing   Problem: Pain Management: Goal: General experience of comfort will improve Outcome: Not Progressing   Problem: Safety: Goal: Ability to remain free from injury will improve Outcome: Not Progressing   Problem: Skin Integrity: Goal: Risk for impaired skin integrity will decrease Outcome: Not Progressing   Problem: Education: Goal: Ability to describe self-care measures that may prevent or decrease complications (Diabetes Survival Skills Education) will improve Outcome: Not Progressing Goal: Individualized Educational Video(s) Outcome: Not Progressing   Problem: Coping: Goal: Ability to adjust to condition or change in health will  improve Outcome: Not Progressing   Problem: Fluid Volume: Goal: Ability to maintain a balanced intake and output will improve Outcome: Not Progressing   Problem: Health Behavior/Discharge Planning: Goal: Ability to identify and utilize available resources and services will improve Outcome: Not Progressing Goal: Ability to manage health-related needs will improve Outcome: Not Progressing   Problem: Metabolic: Goal: Ability to maintain appropriate glucose levels will improve Outcome: Not Progressing   Problem: Nutritional: Goal: Maintenance of adequate nutrition will improve Outcome: Not Progressing Goal: Progress toward achieving an optimal weight will improve Outcome: Not Progressing   Problem: Skin Integrity: Goal: Risk for impaired skin integrity will decrease Outcome: Not Progressing   Problem: Tissue Perfusion: Goal: Adequacy of tissue perfusion will improve Outcome: Not Progressing

## 2023-10-02 NOTE — Progress Notes (Signed)
Progress Note   Patient: [name] DOB: 11-09-1940 DOA: 09/29/2023     2 DOS: the patient was seen and examined on 10/02/2023   Brief hospital course: 82 year old female with a PMH significant for HTN, HLD, CHF, Afib on coumadin (no rate control due to chronic bradycardia), COPD, Pulmonary fibrosis, Diabetes type 2 insulin dependent, CKD stage IV s/p Right Nephrectomy, Anemia, GERD, and recent Right femur fracture (treated medically) that presented 09/29/2023 with Generalized weakness progressing for past 5 days. According to patient and family patient had a fall 10/22 with resultant hip fracture requiring hospitalization 10/22-11/10/2022 followed by rehab at [facility] for 4 weeks. Over that past 5 days patient has not been eating or drinking much, increased fatigue, no cough, fevers, or chills. Daughter states patient has had bloody urine and constipation. 12/6 patient went to the ER and labs sent, however left due to the wait time, patient left. They revealed worsening renal function BUN 137/creat 9.77 (baseline creat 2.4, BUN 35 over past 6 months). Patient was notified today of these results and presented to the ER were labs confirmed worsening function BUN 137, creat 9.77, WBC 18.9, Lactic acid 9.0, INR greater than 10. ED course: Patient went into Afib RVR shortly after arrival to the ER. Patient was given 3 liters IVF for resuscitation. K-centra and Vitamin K was administered for reversal of her INR > 10, repeat now 1.8. Cultures sent, UA non diagnostic due to gross hematuria and culture pending, CXR without evidence of acute cardiopulmonary process, Renal US without hydronephrosis or mass (surgically absent right kidney). With ongoing hypotension patient was started on Levophed infusion and admitted to the Medical ICU for continuation of care.  12/8: admitted to ICU with Septic shock of unclear etiology. 3 Liters IVF, Levophed, Cultures, antibiotics, Bicarbonate push and  drip, INR > 10, K-centra and Vitamin K. Afib RVR, metoprolol 2.5 mg x 1. Concern for GIB due coagulopathy, trend H/H. Nephrology consulted and Urology consulted. DNR/DNI with aggressive care. 12/8: Family refused dialysis, felt not to be a good surgical candidate bladder repair. Later in the evening pt's family elected to transition to COMFORT MEASURES. 12/9: Awake, alert and interactive, no complaints, Wants to drink and eat. Remains on comfort measures. 12/10.  Spoke with patient's daughter and she wanted me to draw labs.  She is okay with DNR status but wanted me to draw labs and call her back.   Assessment and Plan: * Septic shock (HCC) Patient was placed on comfort care measures by critical care team.  Handed over to the medical team.  Initially was on vasopressors and antibiotics.  Extraperitoneal bladder perforation Seen by urology.  Patient has Foley catheter.  Patient made comfort care by critical care team.  Acute blood loss anemia Last hemoglobin 6.3.  Patient's aide mentioned that she is having black tar-like stools.  [name] wanted me to check labs.  Acute kidney injury superimposed on CKD [facility]) Family decided against dialysis but patient's daughter wanted me to check labs today.  Atrial fibrillation with RVR (HCC) Chronic in nature heart rate a little bit above 100 today.  Acute on chronic combined systolic and diastolic CHF (congestive heart failure) (HCC) No signs of heart failure currently.  Decubitus ulcer of buttock, stage 2 (HCC) Right buttock stage II, see full description below, present on admission        Subjective: Patient feeling okay.  As per aide had a black stool.  Patient does not have much  abdominal discomfort.  No nausea or vomiting.  Was getting ready to eat breakfast.  Medical team taking over after critical care team made comfort care.  In speaking with the patient's daughter she would like me to check labs.  Physical Exam: Vitals:    10/02/23 0500 10/02/23 0600 10/02/23 0700 10/02/23 0745  BP:    (!) 107/97  Pulse: 90 (!) 115 95 (!) 105  Resp: (!) 22 19 20  (!) 22  Temp:    (!) 97.1 F (36.2 C)  TempSrc:    Axillary  SpO2: 99% 98% 96% 94%  Weight:      Height:       Physical Exam HENT:     Mouth/Throat:     Pharynx: No oropharyngeal exudate.  Eyes:     General: Lids are normal.     Comments: Pale conjunctiva  Cardiovascular:     Rate and Rhythm: Regular rhythm. Tachycardia present.     Heart sounds: Normal heart sounds, S1 normal and S2 normal.  Pulmonary:     Breath sounds: No decreased breath sounds, wheezing, rhonchi or rales.  Abdominal:     Palpations: Abdomen is soft.     Tenderness: There is abdominal tenderness in the suprapubic area.  Musculoskeletal:     Right lower leg: No swelling.     Left lower leg: No swelling.  Skin:    General: Skin is warm.     Findings: No rash.  Neurological:     Mental Status: She is alert.     Comments: Able to straight leg raise.     Data Reviewed: [name] wanted me to draw another set of labs. Creatinine on 12/8 was 8.31 and hemoglobin 6.3  Family Communication: Spoke with patient's daughter on the phone  Disposition: Status is: Inpatient Remains inpatient appropriate because: Not sure at this point if patient is total comfort care but patient's daughter is okay with DNR but she wanted me to check labs.  Planned Discharge Destination: Yet to be determined    Time spent: 28 minutes  Author: Alford Highland, MD 10/02/2023 12:17 PM  For on call review [email or web address].

## 2023-10-02 NOTE — Assessment & Plan Note (Signed)
Right buttock stage II, see full description below, present on admission

## 2023-10-02 NOTE — Assessment & Plan Note (Signed)
Seen by urology.  Patient has Foley catheter.  Patient made comfort care by critical care team.

## 2023-10-02 NOTE — Assessment & Plan Note (Signed)
Patient was placed on comfort care measures by critical care team.  Handed over to the medical team.  Initially was on vasopressors and antibiotics.

## 2023-10-02 NOTE — Progress Notes (Signed)
Reviewed labs with patient's daughter.  Patient's creatinine is a little bit worse than 2 days ago.  Hemoglobin is slightly better than 2 days ago.  She is okay with moving patient to a room.  Did not want to proceed with hospice just yet.  Will get palliative care consultation.  Continue medications for comfort if needed.  Dr. Alford Highland

## 2023-10-02 NOTE — Assessment & Plan Note (Signed)
Family decided against dialysis but patient's daughter wanted me to check labs today.

## 2023-10-02 NOTE — Hospital Course (Addendum)
82 year old female with a PMH significant for HTN, HLD, CHF, Afib on coumadin (no rate control due to chronic bradycardia), COPD, Pulmonary fibrosis, Diabetes type 2 insulin dependent, CKD stage IV s/p Right Nephrectomy, Anemia, GERD, and recent Right femur fracture (treated medically) that presented 09/29/2023 with Generalized weakness progressing for past 5 days. According to patient and family patient had a fall 10/22 with resultant hip fracture requiring hospitalization 10/22-11/10/2022 followed by rehab at [facility] for 4 weeks. Over that past 5 days patient has not been eating or drinking much, increased fatigue, no cough, fevers, or chills. Daughter states patient has had bloody urine and constipation. 12/6 patient went to the ER and labs sent, however left due to the wait time, patient left. They revealed worsening renal function BUN 137/creat 9.77 (baseline creat 2.4, BUN 35 over past 6 months). Patient was notified today of these results and presented to the ER were labs confirmed worsening function BUN 137, creat 9.77, WBC 18.9, Lactic acid 9.0, INR greater than 10. ED course: Patient went into Afib RVR shortly after arrival to the ER. Patient was given 3 liters IVF for resuscitation. K-centra and Vitamin K was administered for reversal of her INR > 10, repeat now 1.8. Cultures sent, UA non diagnostic due to gross hematuria and culture pending, CXR without evidence of acute cardiopulmonary process, Renal US without hydronephrosis or mass (surgically absent right kidney). With ongoing hypotension patient was started on Levophed infusion and admitted to the Medical ICU for continuation of care.  12/8: admitted to ICU with Septic shock of unclear etiology. 3 Liters IVF, Levophed, Cultures, antibiotics, Bicarbonate push and drip, INR > 10, K-centra and Vitamin K. Afib RVR, metoprolol 2.5 mg x 1. Concern for GIB due coagulopathy, trend H/H. Nephrology consulted and Urology consulted. DNR/DNI with  aggressive care. 12/8: Family refused dialysis, felt not to be a good surgical candidate bladder repair. Later in the evening pt's family elected to transition to COMFORT MEASURES. 12/9: Awake, alert and interactive, no complaints, Wants to drink and eat. Remains on comfort measures. 12/10.  Spoke with patient's daughter and she wanted me to draw labs.  She is okay with DNR status but wanted me to draw labs and call her back. 12/11: Labs done yesterday at daughter request with mild hypokalemia and worsening of creatinine to 8.69, BUN 139, hemoglobin of 6.7, some improvement in leukocytosis at 12.7.  Urine cultures with Enterobacter cloacae, resistant to Zosyn.  Blood cultures remain negative.  Patient remained alert and oriented and appears comfortable. 12/12: Family met with hospice today, unfortunately currently not qualify for inpatient hospice services and they are recommending LTC with hospice following.  Apparently patient does not have insurance for LTC.  TOC is working with family. 12/13: Having difficulty placing her to LTC or to inpatient hospice services as she apparently does not qualify, they only take patients with active symptom management.  Daughter is okay to take her back home with hospice help on Monday as she is working over the weekend. 12/14: Remained hemodynamically stable.  Hopefully can be discharged home with hospice help on Monday. 12/15: Remained hemodynamically stable.  Daughter will take her back home tomorrow after lunch, patient will need ambulance transfer.  12/16: Patient remained hemodynamically stable.  P.o. intake remained poor but denies any pain.  Oriented to self and place.  She is being discharged home with hospice health for further care.  Patient was provided with comfort medications to be used as needed.  Patient  will continue on current medications and hospice will take over from here.

## 2023-10-02 NOTE — Assessment & Plan Note (Addendum)
Last hemoglobin 6.3.  Patient's aide mentioned that she is having black tar-like stools.  [name] wanted me to check labs.

## 2023-10-03 DIAGNOSIS — A419 Sepsis, unspecified organism: Secondary | ICD-10-CM | POA: Diagnosis not present

## 2023-10-03 DIAGNOSIS — D62 Acute posthemorrhagic anemia: Secondary | ICD-10-CM | POA: Diagnosis not present

## 2023-10-03 DIAGNOSIS — I4891 Unspecified atrial fibrillation: Secondary | ICD-10-CM | POA: Diagnosis not present

## 2023-10-03 DIAGNOSIS — N3289 Other specified disorders of bladder: Secondary | ICD-10-CM | POA: Diagnosis not present

## 2023-10-03 NOTE — TOC Progression Note (Signed)
Transition of Care [facility]) - Progression Note    Patient Details  Name: [name] MRN: [id number] Date of Birth: [birth year]  Transition of Care [facility]) CM/SW Contact  Allena Katz, LCSW Phone Number: 10/03/2023, 4:31 PM  Clinical Narrative:   CSW spoke with daughter June and she would like pt to first be evaluated for the local [facility] hospice house. CSW has made referral to jack with [facility].         Expected Discharge Plan and Services                                               Social Determinants of Health (SDOH) Interventions SDOH Screenings   Food Insecurity: No Food Insecurity (09/30/2023)  Housing: Patient Declined (09/30/2023)  Transportation Needs: No Transportation Needs (09/30/2023)  Utilities: Not At Risk (09/30/2023)  Alcohol Screen: Low Risk  (03/14/2022)  Depression (PHQ2-9): Low Risk  (08/06/2023)  Tobacco Use: Medium Risk (09/29/2023)    Readmission Risk Interventions    06/29/2021    8:45 AM 05/27/2021    2:34 PM  Readmission Risk Prevention Plan  Transportation Screening Complete Complete  Medication Review (RN Care Manager) Complete Complete  PCP or Specialist appointment within 3-5 days of discharge Complete Complete  HRI or Home Care Consult Complete Complete  SW Recovery Care/Counseling Consult Complete Complete  Palliative Care Screening Not Applicable Complete  Skilled Nursing Facility Not Applicable Complete

## 2023-10-03 NOTE — Progress Notes (Signed)
Progress Note   Patient: [name] EXB:[id number] DOB: 1941/01/26 DOA: 09/29/2023     3 DOS: the patient was seen and examined on 10/03/2023   Brief hospital course: 82 year old female with a PMH significant for HTN, HLD, CHF, Afib on coumadin (no rate control due to chronic bradycardia), COPD, Pulmonary fibrosis, Diabetes type 2 insulin dependent, CKD stage IV s/p Right Nephrectomy, Anemia, GERD, and recent Right femur fracture (treated medically) that presented 09/29/2023 with Generalized weakness progressing for past 5 days. According to patient and family patient had a fall 10/22 with resultant hip fracture requiring hospitalization 10/22-11/10/2022 followed by rehab at [facility] for 4 weeks. Over that past 5 days patient has not been eating or drinking much, increased fatigue, no cough, fevers, or chills. Daughter states patient has had bloody urine and constipation. 12/6 patient went to the ER and labs sent, however left due to the wait time, patient left. They revealed worsening renal function BUN 137/creat 9.77 (baseline creat 2.4, BUN 35 over past 6 months). Patient was notified today of these results and presented to the ER were labs confirmed worsening function BUN 137, creat 9.77, WBC 18.9, Lactic acid 9.0, INR greater than 10. ED course: Patient went into Afib RVR shortly after arrival to the ER. Patient was given 3 liters IVF for resuscitation. K-centra and Vitamin K was administered for reversal of her INR > 10, repeat now 1.8. Cultures sent, UA non diagnostic due to gross hematuria and culture pending, CXR without evidence of acute cardiopulmonary process, Renal US without hydronephrosis or mass (surgically absent right kidney). With ongoing hypotension patient was started on Levophed infusion and admitted to the Medical ICU for continuation of care.  12/8: admitted to ICU with Septic shock of unclear etiology. 3 Liters IVF, Levophed, Cultures, antibiotics, Bicarbonate push and  drip, INR > 10, K-centra and Vitamin K. Afib RVR, metoprolol 2.5 mg x 1. Concern for GIB due coagulopathy, trend H/H. Nephrology consulted and Urology consulted. DNR/DNI with aggressive care. 12/8: Family refused dialysis, felt not to be a good surgical candidate bladder repair. Later in the evening pt's family elected to transition to COMFORT MEASURES. 12/9: Awake, alert and interactive, no complaints, Wants to drink and eat. Remains on comfort measures. 12/10.  Spoke with patient's daughter and she wanted me to draw labs.  She is okay with DNR status but wanted me to draw labs and call her back. 12/11: Labs done yesterday at daughter request with mild hypokalemia and worsening of creatinine to 8.69, BUN 139, hemoglobin of 6.7, some improvement in leukocytosis at 12.7.  Urine cultures with Enterobacter cloacae, resistant to Zosyn.  Blood cultures remain negative.  Patient remained alert and oriented and appears comfortable.   Assessment and Plan: * Septic shock (HCC) Patient was placed on comfort care measures by critical care team.  Handed over to the medical team.  Initially was on vasopressors and antibiotics.  Extraperitoneal bladder perforation Seen by urology.  Patient has Foley catheter.  Patient made comfort care by critical care team.  Acute blood loss anemia Last hemoglobin 6.3.  Patient's aide mentioned that she is having black tar-like stools.  [name] wanted me to check labs.  Acute kidney injury superimposed on CKD [facility]) Family decided against dialysis but patient's daughter wanted me to check labs today.  Atrial fibrillation with RVR (HCC) Chronic in nature heart rate a little bit above 100 today.  Acute on chronic combined systolic and diastolic CHF (congestive heart failure) (HCC)  No signs of heart failure currently.  Decubitus ulcer of buttock, stage 2 (HCC) Right buttock stage II, see full description below, present on admission      Subjective: Patient  was seen and examined today.  Denies any pain.  Appears comfortable, sitting in bed.  Physical Exam: Vitals:   10/03/23 0820 10/03/23 0900 10/03/23 1000 10/03/23 1516  BP: 103/63   (!) 96/57  Pulse: (!) 104 (!) 123 (!) 129 (!) 104  Resp: (!) 32 20 15   Temp: (!) 97.3 F (36.3 C)     TempSrc: Oral     SpO2: 95% 98% 99%   Weight:      Height:       General.  Frail elderly lady, in no acute distress. Pulmonary.  Lungs clear bilaterally, normal respiratory effort. CV.  Regular rate and rhythm, no JVD, rub or murmur. Abdomen.  Soft, nontender, nondistended, BS positive. CNS.  Alert and oriented .  No focal neurologic deficit. Extremities.  No edema, no cyanosis, pulses intact and symmetrical.  Data Reviewed: Prior data reviewed.  Family Communication: Spoke with patient's daughter on the phone.  She agrees to involve hospice.  Disposition: Status is: Inpatient Remains inpatient appropriate because: Severity of illness  Planned Discharge Destination: Yet to be determined  Time spent: 40 minutes  This record has been created using Conservation officer, historic buildings. Errors have been sought and corrected,but may not always be located. Such creation errors do not reflect on the standard of care.   Author: Arnetha Courser, MD 10/03/2023 3:27 PM  For on call review [email or web address].

## 2023-10-03 NOTE — TOC CM/SW Note (Signed)
Transition of Care [facility]) - Inpatient Brief Assessment   Patient Details  Name: [name] MRN: [id number] Date of Birth: 25-Jun-1941  Transition of Care [facility]) CM/SW Contact:    Margarito Liner, LCSW Phone Number: 10/03/2023, 3:44 PM   Clinical Narrative: CSW reviewed chart. Patient is comfort care but disposition plan is pending. TOC will continue to follow progress. Please place [facility] consult once plan is determined.  Transition of Care Asessment: Insurance and Status: Insurance coverage has been reviewed Patient has primary care physician: Yes Home environment has been reviewed: Mobile home Prior level of function:: Not documented Prior/Current Home Services: No current home services Social Determinants of Health Reivew: SDOH reviewed no interventions necessary Readmission risk has been reviewed: Yes Transition of care needs: transition of care needs identified, TOC will continue to follow

## 2023-10-03 NOTE — Plan of Care (Signed)
  Problem: Education: Goal: Knowledge of General Education information will improve Description: Including pain rating scale, medication(s)/side effects and non-pharmacologic comfort measures Outcome: Not Progressing   Problem: Health Behavior/Discharge Planning: Goal: Ability to manage health-related needs will improve Outcome: Not Progressing   Problem: Clinical Measurements: Goal: Ability to maintain clinical measurements within normal limits will improve Outcome: Not Progressing Goal: Will remain free from infection Outcome: Not Progressing Goal: Diagnostic test results will improve Outcome: Not Progressing Goal: Respiratory complications will improve Outcome: Not Progressing Goal: Cardiovascular complication will be avoided Outcome: Not Progressing   Problem: Activity: Goal: Risk for activity intolerance will decrease Outcome: Not Progressing   Problem: Nutrition: Goal: Adequate nutrition will be maintained Outcome: Not Progressing   Problem: Coping: Goal: Level of anxiety will decrease Outcome: Not Progressing   Problem: Elimination: Goal: Will not experience complications related to bowel motility Outcome: Not Progressing Goal: Will not experience complications related to urinary retention Outcome: Not Progressing   Problem: Pain Management: Goal: General experience of comfort will improve Outcome: Not Progressing   Problem: Safety: Goal: Ability to remain free from injury will improve Outcome: Not Progressing   Problem: Skin Integrity: Goal: Risk for impaired skin integrity will decrease Outcome: Not Progressing   Problem: Education: Goal: Ability to describe self-care measures that may prevent or decrease complications (Diabetes Survival Skills Education) will improve Outcome: Not Progressing Goal: Individualized Educational Video(s) Outcome: Not Progressing   Problem: Coping: Goal: Ability to adjust to condition or change in health will  improve Outcome: Not Progressing   Problem: Fluid Volume: Goal: Ability to maintain a balanced intake and output will improve Outcome: Not Progressing   Problem: Health Behavior/Discharge Planning: Goal: Ability to identify and utilize available resources and services will improve Outcome: Not Progressing Goal: Ability to manage health-related needs will improve Outcome: Not Progressing   Problem: Metabolic: Goal: Ability to maintain appropriate glucose levels will improve Outcome: Not Progressing   Problem: Nutritional: Goal: Maintenance of adequate nutrition will improve Outcome: Not Progressing Goal: Progress toward achieving an optimal weight will improve Outcome: Not Progressing   Problem: Skin Integrity: Goal: Risk for impaired skin integrity will decrease Outcome: Not Progressing   Problem: Tissue Perfusion: Goal: Adequacy of tissue perfusion will improve Outcome: Not Progressing

## 2023-10-03 NOTE — Progress Notes (Signed)
Informed family of new room 104

## 2023-10-04 DIAGNOSIS — R6521 Severe sepsis with septic shock: Secondary | ICD-10-CM | POA: Diagnosis not present

## 2023-10-04 DIAGNOSIS — N179 Acute kidney failure, unspecified: Secondary | ICD-10-CM | POA: Diagnosis not present

## 2023-10-04 DIAGNOSIS — N3289 Other specified disorders of bladder: Secondary | ICD-10-CM | POA: Diagnosis not present

## 2023-10-04 DIAGNOSIS — A419 Sepsis, unspecified organism: Secondary | ICD-10-CM | POA: Diagnosis not present

## 2023-10-04 DIAGNOSIS — I4891 Unspecified atrial fibrillation: Secondary | ICD-10-CM | POA: Diagnosis not present

## 2023-10-04 DIAGNOSIS — Z515 Encounter for palliative care: Secondary | ICD-10-CM

## 2023-10-04 DIAGNOSIS — D62 Acute posthemorrhagic anemia: Secondary | ICD-10-CM | POA: Diagnosis not present

## 2023-10-04 MED ORDER — ONDANSETRON 4 MG PO TBDP: 4.0000 mg | ORAL_TABLET | Freq: Four times a day (QID) | ORAL | Status: DC | PRN

## 2023-10-04 MED ORDER — HALOPERIDOL LACTATE 2 MG/ML PO CONC: 0.5000 mg | ORAL | Status: DC | PRN

## 2023-10-04 MED ORDER — HALOPERIDOL LACTATE 5 MG/ML IJ SOLN: 0.5000 mg | INTRAMUSCULAR | Status: DC | PRN

## 2023-10-04 MED ORDER — HYDROMORPHONE HCL 1 MG/ML IJ SOLN: 0.5000 mg | INTRAMUSCULAR | Status: DC | PRN

## 2023-10-04 MED ORDER — LORAZEPAM 1 MG PO TABS: 1.0000 mg | ORAL_TABLET | ORAL | Status: DC | PRN

## 2023-10-04 MED ORDER — ONDANSETRON HCL 4 MG/2ML IJ SOLN: 4.0000 mg | Freq: Four times a day (QID) | INTRAMUSCULAR | Status: DC | PRN

## 2023-10-04 MED ORDER — HALOPERIDOL 0.5 MG PO TABS: 0.5000 mg | ORAL_TABLET | ORAL | Status: DC | PRN

## 2023-10-04 MED ORDER — LORAZEPAM 2 MG/ML PO CONC: 1.0000 mg | ORAL | Status: DC | PRN

## 2023-10-04 MED ORDER — LORAZEPAM 2 MG/ML IJ SOLN: 1.0000 mg | INTRAMUSCULAR | Status: DC | PRN

## 2023-10-04 NOTE — Progress Notes (Addendum)
[facility]- Room 104  [facility]    Received request from Transitions of Care Manger for family interest in [facility].  Met with patient's daughter to confirm interest and explain services. Patient is not appropriate for admission to the [facility] at this time.  Spoke with daughter about Hospice services in a LTC facility. TOC and hospital team aware.    Please call with any Hospice related questions or concerns.  Thank you for the opportunity to participate in this patients care.    Redge Gainer,  [facility] Liaison [phone]

## 2023-10-04 NOTE — Progress Notes (Signed)
Progress Note   Patient: [name] ZOX:[id number] DOB: 10/20/1941 DOA: 09/29/2023     4 DOS: the patient was seen and examined on 10/04/2023   Brief hospital course: 82 year old female with a PMH significant for HTN, HLD, CHF, Afib on coumadin (no rate control due to chronic bradycardia), COPD, Pulmonary fibrosis, Diabetes type 2 insulin dependent, CKD stage IV s/p Right Nephrectomy, Anemia, GERD, and recent Right femur fracture (treated medically) that presented 09/29/2023 with Generalized weakness progressing for past 5 days. According to patient and family patient had a fall 10/22 with resultant hip fracture requiring hospitalization 10/22-11/10/2022 followed by rehab at [facility] for 4 weeks. Over that past 5 days patient has not been eating or drinking much, increased fatigue, no cough, fevers, or chills. Daughter states patient has had bloody urine and constipation. 12/6 patient went to the ER and labs sent, however left due to the wait time, patient left. They revealed worsening renal function BUN 137/creat 9.77 (baseline creat 2.4, BUN 35 over past 6 months). Patient was notified today of these results and presented to the ER were labs confirmed worsening function BUN 137, creat 9.77, WBC 18.9, Lactic acid 9.0, INR greater than 10. ED course: Patient went into Afib RVR shortly after arrival to the ER. Patient was given 3 liters IVF for resuscitation. K-centra and Vitamin K was administered for reversal of her INR > 10, repeat now 1.8. Cultures sent, UA non diagnostic due to gross hematuria and culture pending, CXR without evidence of acute cardiopulmonary process, Renal US without hydronephrosis or mass (surgically absent right kidney). With ongoing hypotension patient was started on Levophed infusion and admitted to the Medical ICU for continuation of care.  12/8: admitted to ICU with Septic shock of unclear etiology. 3 Liters IVF, Levophed, Cultures, antibiotics, Bicarbonate push and  drip, INR > 10, K-centra and Vitamin K. Afib RVR, metoprolol 2.5 mg x 1. Concern for GIB due coagulopathy, trend H/H. Nephrology consulted and Urology consulted. DNR/DNI with aggressive care. 12/8: Family refused dialysis, felt not to be a good surgical candidate bladder repair. Later in the evening pt's family elected to transition to COMFORT MEASURES. 12/9: Awake, alert and interactive, no complaints, Wants to drink and eat. Remains on comfort measures. 12/10.  Spoke with patient's daughter and she wanted me to draw labs.  She is okay with DNR status but wanted me to draw labs and call her back. 12/11: Labs done yesterday at daughter request with mild hypokalemia and worsening of creatinine to 8.69, BUN 139, hemoglobin of 6.7, some improvement in leukocytosis at 12.7.  Urine cultures with Enterobacter cloacae, resistant to Zosyn.  Blood cultures remain negative.  Patient remained alert and oriented and appears comfortable. 12/12: Family met with hospice today, unfortunately currently not qualify for inpatient hospice services and they are recommending LTC with hospice following.  Apparently patient does not have insurance for LTC.  TOC is working with family   Assessment and Plan: * Septic shock (HCC) Patient was placed on comfort care measures by critical care team.  Handed over to the medical team.  Initially was on vasopressors and antibiotics.  Extraperitoneal bladder perforation Seen by urology.  Patient has Foley catheter.  Patient made comfort care by critical care team.  Acute blood loss anemia Last hemoglobin 6.3.  Patient's aide mentioned that she is having black tar-like stools.  [name] wanted me to check labs.  Acute kidney injury superimposed on CKD [facility]) Family decided against dialysis but patient's  daughter wanted me to check labs today.  Atrial fibrillation with RVR (HCC) Chronic in nature heart rate a little bit above 100 today.  Acute on chronic combined  systolic and diastolic CHF (congestive heart failure) (HCC) No signs of heart failure currently.  Decubitus ulcer of buttock, stage 2 (HCC) Right buttock stage II, see full description below, present on admission      Subjective: Patient appears little confused and slow but able to answer questions appropriately.  Denies any pain.  Physical Exam: Vitals:   10/03/23 1516 10/03/23 1642 10/04/23 0747 10/04/23 1600  BP: (!) 96/57 101/60 112/71 (!) 89/76  Pulse: (!) 104 98 (!) 114 (!) 119  Resp:  18 18 16   Temp:  97.8 F (36.6 C) 98.2 F (36.8 C) 98 F (36.7 C)  TempSrc:      SpO2:  100% 100% 97%  Weight:      Height:       General.  Frail elderly lady, in no acute distress. Pulmonary.  Lungs clear bilaterally, normal respiratory effort. CV.  Regular rate and rhythm, no JVD, rub or murmur. Abdomen.  Soft, nontender, nondistended, BS positive. CNS.  Alert and oriented .  No focal neurologic deficit. Extremities.  No edema, no cyanosis, pulses intact and symmetrical.  Data Reviewed: Prior data reviewed.  Family Communication:   Disposition: Status is: Inpatient Remains inpatient appropriate because: Severity of illness  Planned Discharge Destination: Yet to be determined  Time spent: 40 minutes  This record has been created using Conservation officer, historic buildings. Errors have been sought and corrected,but may not always be located. Such creation errors do not reflect on the standard of care.   Author: Arnetha Courser, MD 10/04/2023 4:22 PM  For on call review [email or web address].

## 2023-10-04 NOTE — Progress Notes (Signed)
Consultation Note Date: 10/04/2023   Patient Name: [name]  DOB: [birth year]  MRN: [id number]  Age / Sex: 82 y.o., female  PCP: Sallyanne Kuster, NP Referring Physician: Arnetha Courser, MD  Reason for Consultation:  goals of care  HPI/Patient Profile: 82 y.o. female  with past medical history of  HTN, HLD, CHF, a-fib, COPD, pulmonary fibrosis, DM2, CKD IV s/p R nephrectomy, anemia, GERD, recent R femur fracture admitted on 09/29/2023 with generalized weakness, poor po intake, hypotension. Admitted for sepsis. CT scan revealed bladder perforation. She was found to be in renal failure. Per discussion with attending team was transitioned to comfort measures only. Palliative medicine consulting for end of life care and symptom management.    Primary Decision Maker NEXT OF KIN - children  Discussion: Chart reviewed including labs, progress notes, imaging from this and previous encounters.  Noted patient is being evaluated by [facility].  Evaluated patient. She was sitting up in bed, holding the phone to her hear, staring straight ahead. She did not acknowledge my presence or respond to voice or touch. She appeared comfortable.  I spoke with patient's daughter- [name].  She confirms GOC is comfort and allowing natural dying process.  We discussed symptom management at end of life. She had questions about disposition- she is planning on meeting with [facility] liaison today.    SUMMARY OF RECOMMENDATIONS -Sepsis in the setting of acute on chronic renal failure- agree with comfort measures only -Symptom management- patient appears comfortable with current regimen- increased interval of hydromorphone to q2hr prn IV as patient is at high risk of decompensating quickly with progression of renal failure -Family meeting with hospice to discuss disposition    Code Status/Advance Care  Planning: DNR   Prognosis:   < 2 weeks  Discharge Planning: To Be Determined  Primary Diagnoses: Present on Admission:  Atrial fibrillation with RVR (HCC)  Acute kidney injury superimposed on CKD (HCC)  Acute on chronic combined systolic and diastolic CHF (congestive heart failure) (HCC)   Review of Systems  Unable to perform ROS: Mental status change    Physical Exam Vitals and nursing note reviewed.  Constitutional:      General: She is in acute distress.     Appearance: She is ill-appearing.  Cardiovascular:     Rate and Rhythm: Tachycardia present.  Pulmonary:     Effort: Pulmonary effort is normal.  Neurological:     Comments: No response to voice or touch     Vital Signs: BP 112/71 (BP Location: Left Arm)   Pulse (!) 114   Temp 98.2 F (36.8 C)   Resp 18   Ht 4\' 10"  (1.473 m)   Wt 68 kg   SpO2 100%   BMI 31.33 kg/m  Pain Scale: 0-10   Pain Score: 0-No pain   SpO2: SpO2: 100 % O2 Device:SpO2: 100 % O2 Flow Rate: .   IO: Intake/output summary:  Intake/Output Summary (Last 24 hours) at 10/04/2023 1156 Last data filed at 10/03/2023 2020 Gross per  24 hour  Intake 720 ml  Output 205 ml  Net 515 ml    LBM: Last BM Date : 10/02/23 Baseline Weight: Weight: 68 kg Most recent weight: Weight: 68 kg       Thank you for this consult. Palliative medicine will continue to follow and assist as needed.  Time Total: 80 minutes  Signed by: Ocie Bob, AGNP-C Palliative Medicine  Time includes:   Preparing to see the patient (e.g., review of tests) Obtaining and/or reviewing separately obtained history Performing a medically necessary appropriate examination and/or evaluation Counseling and educating the patient/family/caregiver Ordering medications, tests, or procedures Referring and communicating with other health care professionals (when not reported separately) Documenting clinical information in the electronic or other health  record Independently interpreting results (not reported separately) and communicating results to the patient/family/caregiver Care coordination (not reported separately) Clinical documentation   Please contact Palliative Medicine Team phone at [phone] for questions and concerns.  For individual provider: See Loretha Stapler

## 2023-10-04 NOTE — Consult Note (Signed)
[facility] Liaison Note  10/04/2023  [name] 1941-03-09 [id number]  Location: RN Hospital Liaison screened the patient remotely at [facility].  Insurance: [facility] HMO   [name] is a 82 y.o. female who is a Primary Care Patient of Abernathy, Arlyss Repress, NP The patient was screened for readmission hospitalization with noted extreme risk score for unplanned readmission risk with 1 IP/ 1 ED in 6 months.  The patient was assessed for potential Care Management service needs for post hospital transition for care coordination. Review of patient's electronic medical record reveals patient was admitted with Septic shock. Currently pt being evaluated by [facility] for IP services.  Plan: [facility] Liaison will continue to follow progress and disposition to asess for post hospital community care coordination/management needs.  Referral request for community care coordination: pending disposition.   VBCI Care Management/Population Health does not replace or interfere with any arrangements made by the Inpatient Transition of Care team.   For questions contact:   Elliot Cousin, RN, [facility] Liaison [place]   [facility], Population Health Office Hours MTWF  8:00 am-6:00 pm Direct Dial: [phone] mobile [phone] [Office toll free line] Office Hours are M-F 8:30 - 5 pm [email or web address]

## 2023-10-05 DIAGNOSIS — Z515 Encounter for palliative care: Secondary | ICD-10-CM | POA: Diagnosis not present

## 2023-10-05 DIAGNOSIS — N3289 Other specified disorders of bladder: Secondary | ICD-10-CM | POA: Diagnosis not present

## 2023-10-05 DIAGNOSIS — I4891 Unspecified atrial fibrillation: Secondary | ICD-10-CM | POA: Diagnosis not present

## 2023-10-05 DIAGNOSIS — D62 Acute posthemorrhagic anemia: Secondary | ICD-10-CM | POA: Diagnosis not present

## 2023-10-05 DIAGNOSIS — A419 Sepsis, unspecified organism: Secondary | ICD-10-CM | POA: Diagnosis not present

## 2023-10-05 DIAGNOSIS — N184 Chronic kidney disease, stage 4 (severe): Secondary | ICD-10-CM | POA: Diagnosis not present

## 2023-10-05 LAB — CULTURE, BLOOD (ROUTINE X 2)
Culture: NO GROWTH
Culture: NO GROWTH
Special Requests: ADEQUATE

## 2023-10-05 NOTE — Progress Notes (Signed)
                                                                                                                                                                                                           Daily Progress Note   Patient Name: [name]       Date: 10/05/2023 DOB: 03/15/1941  Age: 82 y.o. MRN#: [id number] Attending Physician: Arnetha Courser, MD Primary Care Physician: Sallyanne Kuster, NP Admit Date: 09/29/2023  Reason for Consultation/Follow-up: Establishing goals of care  HPI/Brief Hospital Review: 82 y.o. female  with past medical history of  HTN, HLD, CHF, a-fib, COPD, pulmonary fibrosis, DM2, CKD IV s/p R nephrectomy, anemia, GERD, recent R femur fracture admitted on 09/29/2023 with generalized weakness, poor po intake, hypotension. Admitted for sepsis. CT scan revealed bladder perforation. She was found to be in renal failure. Per discussion with attending team was transitioned to comfort measures only. Palliative medicine consulting for end of life care and symptom management.    Subjective: Extensive chart review has been completed prior to meeting patient including labs, vital signs, imaging, progress notes, orders, and available advanced directive documents from current and previous encounters.    Visited with [name] at her bedside. She is resting in bed with eyes closed, does not acknowledge my presence in room. Appears comfortable, no signs of discomfort or distress. No family at bedside during time of visit.  Hospice liaison to evaluate for IPU. From review of MAR, not requiring symptom management needs at this time. No recommendation for medication adjustments. Likely to anticipate LTC placement needs with hospice following-TOC assisting with disposition.   Thank you for allowing the Palliative Medicine Team to assist in the care of this patient.  Total time:  25 minutes  Time spent includes: Detailed review of medical records (labs, imaging, vital signs),  medically appropriate exam (mental status, respiratory, cardiac, skin), discussed with treatment team, counseling and educating patient, family and staff, documenting clinical information, medication management and coordination of care.  Leeanne Deed, DNP, AGNP-C Palliative Medicine   Please contact Palliative Medicine Team phone at [phone] for questions and concerns.

## 2023-10-05 NOTE — TOC Progression Note (Signed)
Transition of Care [facility]) - Progression Note    Patient Details  Name: BRANDLYN PAOLO MRN: [id number] Date of Birth: Oct 04, 1941  Transition of Care [facility]) CM/SW Contact  Liliana Cline, LCSW Phone Number: 10/05/2023, 12:58 PM  Clinical Narrative:    Checked with Care Team regarding plan for the patient. Ree Kida with [facility] to reassess patient this afternoon. TOC will continue to follow and assist as needed.        Expected Discharge Plan and Services                                               Social Determinants of Health (SDOH) Interventions SDOH Screenings   Food Insecurity: No Food Insecurity (09/30/2023)  Housing: Patient Declined (09/30/2023)  Transportation Needs: No Transportation Needs (09/30/2023)  Utilities: Not At Risk (09/30/2023)  Alcohol Screen: Low Risk  (03/14/2022)  Depression (PHQ2-9): Low Risk  (08/06/2023)  Tobacco Use: Medium Risk (09/29/2023)    Readmission Risk Interventions    06/29/2021    8:45 AM 05/27/2021    2:34 PM  Readmission Risk Prevention Plan  Transportation Screening Complete Complete  Medication Review (RN Care Manager) Complete Complete  PCP or Specialist appointment within 3-5 days of discharge Complete Complete  HRI or Home Care Consult Complete Complete  SW Recovery Care/Counseling Consult Complete Complete  Palliative Care Screening Not Applicable Complete  Skilled Nursing Facility Not Applicable Complete

## 2023-10-05 NOTE — Plan of Care (Signed)
  Problem: Education: Goal: Knowledge of General Education information will improve Description: Including pain rating scale, medication(s)/side effects and non-pharmacologic comfort measures Outcome: Progressing   Problem: Health Behavior/Discharge Planning: Goal: Ability to manage health-related needs will improve Outcome: Progressing   Problem: Clinical Measurements: Goal: Ability to maintain clinical measurements within normal limits will improve Outcome: Progressing Goal: Will remain free from infection Outcome: Progressing Goal: Diagnostic test results will improve Outcome: Progressing Goal: Respiratory complications will improve Outcome: Progressing Goal: Cardiovascular complication will be avoided Outcome: Progressing   Problem: Activity: Goal: Risk for activity intolerance will decrease Outcome: Progressing   Problem: Nutrition: Goal: Adequate nutrition will be maintained Outcome: Progressing   Problem: Coping: Goal: Level of anxiety will decrease Outcome: Progressing   Problem: Elimination: Goal: Will not experience complications related to bowel motility Outcome: Progressing Goal: Will not experience complications related to urinary retention Outcome: Progressing   Problem: Pain Management: Goal: General experience of comfort will improve Outcome: Progressing   Problem: Safety: Goal: Ability to remain free from injury will improve Outcome: Progressing   Problem: Skin Integrity: Goal: Risk for impaired skin integrity will decrease Outcome: Progressing   Problem: Education: Goal: Ability to describe self-care measures that may prevent or decrease complications (Diabetes Survival Skills Education) will improve Outcome: Progressing Goal: Individualized Educational Video(s) Outcome: Progressing   Problem: Coping: Goal: Ability to adjust to condition or change in health will improve Outcome: Progressing   Problem: Fluid Volume: Goal: Ability to  maintain a balanced intake and output will improve Outcome: Progressing   Problem: Health Behavior/Discharge Planning: Goal: Ability to identify and utilize available resources and services will improve Outcome: Progressing Goal: Ability to manage health-related needs will improve Outcome: Progressing   Problem: Metabolic: Goal: Ability to maintain appropriate glucose levels will improve Outcome: Progressing   Problem: Nutritional: Goal: Maintenance of adequate nutrition will improve Outcome: Progressing Goal: Progress toward achieving an optimal weight will improve Outcome: Progressing   Problem: Skin Integrity: Goal: Risk for impaired skin integrity will decrease Outcome: Progressing   Problem: Tissue Perfusion: Goal: Adequacy of tissue perfusion will improve Outcome: Progressing   Problem: Education: Goal: Knowledge of the prescribed therapeutic regimen will improve Outcome: Progressing   Problem: Coping: Goal: Ability to identify and develop effective coping behavior will improve Outcome: Progressing   Problem: Clinical Measurements: Goal: Quality of life will improve Outcome: Progressing   Problem: Respiratory: Goal: Verbalizations of increased ease of respirations will increase Outcome: Progressing   Problem: Role Relationship: Goal: Family's ability to cope with current situation will improve Outcome: Progressing Goal: Ability to verbalize concerns, feelings, and thoughts to partner or family member will improve Outcome: Progressing   Problem: Pain Management: Goal: Satisfaction with pain management regimen will improve Outcome: Progressing

## 2023-10-05 NOTE — Care Management Important Message (Signed)
Important Message  Patient Details  Name: DEJANIRA HANTHORN MRN: [id number] Date of Birth: 1941/04/09   Important Message Given:  Other (see comment)  Patient is on comfort care and looking at hospice options. Out of respect for the patient and family no Important Message from [facility] given.   Olegario Messier A Cris Gibby 10/05/2023, 9:35 AM

## 2023-10-05 NOTE — Progress Notes (Addendum)
Progress Note   Patient: [name] BMW:[id number] DOB: Mar 02, 1941 DOA: 09/29/2023     5 DOS: the patient was seen and examined on 10/05/2023   Brief hospital course: 82 year old female with a PMH significant for HTN, HLD, CHF, Afib on coumadin (no rate control due to chronic bradycardia), COPD, Pulmonary fibrosis, Diabetes type 2 insulin dependent, CKD stage IV s/p Right Nephrectomy, Anemia, GERD, and recent Right femur fracture (treated medically) that presented 09/29/2023 with Generalized weakness progressing for past 5 days. According to patient and family patient had a fall 10/22 with resultant hip fracture requiring hospitalization 10/22-11/10/2022 followed by rehab at [facility] for 4 weeks. Over that past 5 days patient has not been eating or drinking much, increased fatigue, no cough, fevers, or chills. Daughter states patient has had bloody urine and constipation. 12/6 patient went to the ER and labs sent, however left due to the wait time, patient left. They revealed worsening renal function BUN 137/creat 9.77 (baseline creat 2.4, BUN 35 over past 6 months). Patient was notified today of these results and presented to the ER were labs confirmed worsening function BUN 137, creat 9.77, WBC 18.9, Lactic acid 9.0, INR greater than 10. ED course: Patient went into Afib RVR shortly after arrival to the ER. Patient was given 3 liters IVF for resuscitation. K-centra and Vitamin K was administered for reversal of her INR > 10, repeat now 1.8. Cultures sent, UA non diagnostic due to gross hematuria and culture pending, CXR without evidence of acute cardiopulmonary process, Renal US without hydronephrosis or mass (surgically absent right kidney). With ongoing hypotension patient was started on Levophed infusion and admitted to the Medical ICU for continuation of care.  12/8: admitted to ICU with Septic shock of unclear etiology. 3 Liters IVF, Levophed, Cultures, antibiotics, Bicarbonate push and  drip, INR > 10, K-centra and Vitamin K. Afib RVR, metoprolol 2.5 mg x 1. Concern for GIB due coagulopathy, trend H/H. Nephrology consulted and Urology consulted. DNR/DNI with aggressive care. 12/8: Family refused dialysis, felt not to be a good surgical candidate bladder repair. Later in the evening pt's family elected to transition to COMFORT MEASURES. 12/9: Awake, alert and interactive, no complaints, Wants to drink and eat. Remains on comfort measures. 12/10.  Spoke with patient's daughter and she wanted me to draw labs.  She is okay with DNR status but wanted me to draw labs and call her back. 12/11: Labs done yesterday at daughter request with mild hypokalemia and worsening of creatinine to 8.69, BUN 139, hemoglobin of 6.7, some improvement in leukocytosis at 12.7.  Urine cultures with Enterobacter cloacae, resistant to Zosyn.  Blood cultures remain negative.  Patient remained alert and oriented and appears comfortable. 12/12: Family met with hospice today, unfortunately currently not qualify for inpatient hospice services and they are recommending LTC with hospice following.  Apparently patient does not have insurance for LTC.  TOC is working with family. 12/13: Having difficulty placing her to LTC or to inpatient hospice services as she apparently does not qualify, they only take patients with active symptom management.  Daughter is okay to take her back home with hospice help on Monday as she is working over the weekend.   Assessment and Plan: * Septic shock (HCC) Patient was placed on comfort care measures by critical care team.  Handed over to the medical team.  Initially was on vasopressors and antibiotics.  Extraperitoneal bladder perforation Seen by urology.  Patient has Foley catheter.  Patient made  comfort care by critical care team.  Acute blood loss anemia Last hemoglobin 6.3.  Patient's aide mentioned that she is having black tar-like stools.  [name] wanted me to check  labs.  Acute kidney injury superimposed on CKD [facility]) Family decided against dialysis but patient's daughter wanted me to check labs today.  Atrial fibrillation with RVR (HCC) Chronic in nature heart rate a little bit above 100 today.  Acute on chronic combined systolic and diastolic CHF (congestive heart failure) (HCC) No signs of heart failure currently.  Decubitus ulcer of buttock, stage 2 (HCC) Right buttock stage II, see full description below, present on admission      Subjective: Patient appears comfortable and no new concern.  Nephew was at bedside.  Physical Exam: Vitals:   10/03/23 1642 10/04/23 0747 10/04/23 1600 10/05/23 0728  BP: 101/60 112/71 (!) 89/76 (!) 110/55  Pulse: 98 (!) 114 (!) 119 (!) 104  Resp: 18 18 16 20   Temp:  98.2 F (36.8 C) 98 F (36.7 C) 98 F (36.7 C)  TempSrc:    Oral  SpO2: 100% 100% 97% 99%  Weight:      Height:       General.  Frail and malnourished elderly lady, in no acute distress. Pulmonary.  Lungs clear bilaterally, normal respiratory effort. CV.  Regular rate and rhythm, no JVD, rub or murmur. Abdomen.  Soft, nontender, nondistended, BS positive. CNS.  Alert and oriented x 2.  No focal neurologic deficit. Extremities.  No edema, no cyanosis, pulses intact and symmetrical.  Data Reviewed: Prior data reviewed.  Family Communication: Discussed with nephew at bedside and daughter on phone.  Disposition: Status is: Inpatient Remains inpatient appropriate because: Severity of illness  Planned Discharge Destination: Yet to be determined  Time spent: 42 minutes  This record has been created using Conservation officer, historic buildings. Errors have been sought and corrected,but may not always be located. Such creation errors do not reflect on the standard of care.   Author: Arnetha Courser, MD 10/05/2023 4:01 PM  For on call review [email or web address].

## 2023-10-05 NOTE — TOC Progression Note (Signed)
Transition of Care [facility]) - Progression Note    Patient Details  Name: [name] MRN: [id number] Date of Birth: 1941-03-20  Transition of Care [facility]) CM/SW Contact  Liliana Cline, LCSW Phone Number: 10/05/2023, 4:32 PM  Clinical Narrative:    Per Ree Kida with [facility], patient is still not eligible for Hospice Home at this time. Per MD, plan DC home with daughter and Hospice services on Monday.  Called patient's daughter June. June states she does not currently have anyone who can stay with patient during the day while she is at work, but she is working on making a plan for patient's care. She is aware patient cannot be home alone. She is reaching out to family/friends. June states they cannot afford to private pay for patient to go to a LTC facility with hospice services. June states she is going to try to have a plan in place by Monday so that patient can come home with [facility] on Monday. June states they already have a hospital bed and denies any DME needs that she is aware of.        Expected Discharge Plan and Services                                               Social Determinants of Health (SDOH) Interventions SDOH Screenings   Food Insecurity: No Food Insecurity (09/30/2023)  Housing: Patient Declined (09/30/2023)  Transportation Needs: No Transportation Needs (09/30/2023)  Utilities: Not At Risk (09/30/2023)  Alcohol Screen: Low Risk  (03/14/2022)  Depression (PHQ2-9): Low Risk  (08/06/2023)  Tobacco Use: Medium Risk (09/29/2023)    Readmission Risk Interventions    06/29/2021    8:45 AM 05/27/2021    2:34 PM  Readmission Risk Prevention Plan  Transportation Screening Complete Complete  Medication Review (RN Care Manager) Complete Complete  PCP or Specialist appointment within 3-5 days of discharge Complete Complete  HRI or Home Care Consult Complete Complete  SW Recovery Care/Counseling Consult Complete Complete  Palliative Care  Screening Not Applicable Complete  Skilled Nursing Facility Not Applicable Complete

## 2023-10-06 DIAGNOSIS — A419 Sepsis, unspecified organism: Secondary | ICD-10-CM | POA: Diagnosis not present

## 2023-10-06 DIAGNOSIS — N3289 Other specified disorders of bladder: Secondary | ICD-10-CM | POA: Diagnosis not present

## 2023-10-06 DIAGNOSIS — D62 Acute posthemorrhagic anemia: Secondary | ICD-10-CM | POA: Diagnosis not present

## 2023-10-06 DIAGNOSIS — I4891 Unspecified atrial fibrillation: Secondary | ICD-10-CM | POA: Diagnosis not present

## 2023-10-06 NOTE — Plan of Care (Signed)
  Problem: Education: Goal: Knowledge of the prescribed therapeutic regimen will improve Outcome: Progressing   Problem: Coping: Goal: Ability to identify and develop effective coping behavior will improve Outcome: Progressing   Problem: Clinical Measurements: Goal: Quality of life will improve Outcome: Progressing   Problem: Respiratory: Goal: Verbalizations of increased ease of respirations will increase Outcome: Progressing   Problem: Pain Management: Goal: Satisfaction with pain management regimen will improve Outcome: Progressing   

## 2023-10-06 NOTE — Progress Notes (Signed)
Progress Note   Patient: [name]:[id number] DOB: 17-Jan-1941 DOA: 09/29/2023     6 DOS: the patient was seen and examined on 10/06/2023   Brief hospital course: 82 year old female with a PMH significant for HTN, HLD, CHF, Afib on coumadin (no rate control due to chronic bradycardia), COPD, Pulmonary fibrosis, Diabetes type 2 insulin dependent, CKD stage IV s/p Right Nephrectomy, Anemia, GERD, and recent Right femur fracture (treated medically) that presented 09/29/2023 with Generalized weakness progressing for past 5 days. According to patient and family patient had a fall 10/22 with resultant hip fracture requiring hospitalization 10/22-11/10/2022 followed by rehab at [facility] for 4 weeks. Over that past 5 days patient has not been eating or drinking much, increased fatigue, no cough, fevers, or chills. Daughter states patient has had bloody urine and constipation. 12/6 patient went to the ER and labs sent, however left due to the wait time, patient left. They revealed worsening renal function BUN 137/creat 9.77 (baseline creat 2.4, BUN 35 over past 6 months). Patient was notified today of these results and presented to the ER were labs confirmed worsening function BUN 137, creat 9.77, WBC 18.9, Lactic acid 9.0, INR greater than 10. ED course: Patient went into Afib RVR shortly after arrival to the ER. Patient was given 3 liters IVF for resuscitation. K-centra and Vitamin K was administered for reversal of her INR > 10, repeat now 1.8. Cultures sent, UA non diagnostic due to gross hematuria and culture pending, CXR without evidence of acute cardiopulmonary process, Renal US without hydronephrosis or mass (surgically absent right kidney). With ongoing hypotension patient was started on Levophed infusion and admitted to the Medical ICU for continuation of care.  12/8: admitted to ICU with Septic shock of unclear etiology. 3 Liters IVF, Levophed, Cultures, antibiotics, Bicarbonate push and  drip, INR > 10, K-centra and Vitamin K. Afib RVR, metoprolol 2.5 mg x 1. Concern for GIB due coagulopathy, trend H/H. Nephrology consulted and Urology consulted. DNR/DNI with aggressive care. 12/8: Family refused dialysis, felt not to be a good surgical candidate bladder repair. Later in the evening pt's family elected to transition to COMFORT MEASURES. 12/9: Awake, alert and interactive, no complaints, Wants to drink and eat. Remains on comfort measures. 12/10.  Spoke with patient's daughter and she wanted me to draw labs.  She is okay with DNR status but wanted me to draw labs and call her back. 12/11: Labs done yesterday at daughter request with mild hypokalemia and worsening of creatinine to 8.69, BUN 139, hemoglobin of 6.7, some improvement in leukocytosis at 12.7.  Urine cultures with Enterobacter cloacae, resistant to Zosyn.  Blood cultures remain negative.  Patient remained alert and oriented and appears comfortable. 12/12: Family met with hospice today, unfortunately currently not qualify for inpatient hospice services and they are recommending LTC with hospice following.  Apparently patient does not have insurance for LTC.  TOC is working with family. 12/13: Having difficulty placing her to LTC or to inpatient hospice services as she apparently does not qualify, they only take patients with active symptom management.  Daughter is okay to take her back home with hospice help on Monday as she is working over the weekend. 12/14: Remained hemodynamically stable.  Hopefully can be discharged home with hospice help on Monday.   Assessment and Plan: * Septic shock (HCC) Patient was placed on comfort care measures by critical care team.  Handed over to the medical team.  Initially was on vasopressors and antibiotics.  Extraperitoneal bladder perforation Seen by urology.  Patient has Foley catheter.  Patient made comfort care by critical care team.  Acute blood loss anemia Last hemoglobin 6.3.   Patient's aide mentioned that she is having black tar-like stools.  [name] wanted me to check labs.  Acute kidney injury superimposed on CKD [facility]) Family decided against dialysis but patient's daughter wanted me to check labs today.  Atrial fibrillation with RVR (HCC) Chronic in nature heart rate a little bit above 100 today.  Acute on chronic combined systolic and diastolic CHF (congestive heart failure) (HCC) No signs of heart failure currently.  Decubitus ulcer of buttock, stage 2 (HCC) Right buttock stage II, see full description below, present on admission      Subjective: Patient was seen and examined today.  No new concern and appears comfortable.  Physical Exam: Vitals:   10/04/23 1600 10/05/23 0728 10/06/23 0431 10/06/23 0745  BP: (!) 89/76 (!) 110/55 122/61 108/61  Pulse: (!) 119 (!) 104 99 98  Resp: 16 20 18 18   Temp: 98 F (36.7 C) 98 F (36.7 C) (!) 97.5 F (36.4 C) 97.7 F (36.5 C)  TempSrc:  Oral    SpO2: 97% 99% 99% 100%  Weight:      Height:       General.  Frail elderly lady, in no acute distress. Pulmonary.  Lungs clear bilaterally, normal respiratory effort. CV.  Regular rate and rhythm, no JVD, rub or murmur. Abdomen.  Soft, nontender, nondistended, BS positive. CNS.  Alert and oriented x 2.  No focal neurologic deficit. Extremities.  No edema, no cyanosis, pulses intact and symmetrical.  Data Reviewed: Prior data reviewed.  Family Communication:   Disposition: Status is: Inpatient Remains inpatient appropriate because: Severity of illness  Planned Discharge Destination: Yet to be determined  Time spent: 40 minutes  This record has been created using Conservation officer, historic buildings. Errors have been sought and corrected,but may not always be located. Such creation errors do not reflect on the standard of care.   Author: Arnetha Courser, MD 10/06/2023 3:24 PM  For on call review [email or web address].

## 2023-10-06 NOTE — Progress Notes (Addendum)
[facility]- [facility] Liaison Note  Alerted TOC and MD yesterday that daughter reported to [facility] that she has to work and does not have anyone to care for the patient while she works.  TOC contacted the patient's daughter to discuss discharge plan.  TOC stated that daughter is attempting to find family/friends to help with patient's care, during the times that she is at work,  by Monday.  No current DME needs at this time, according to TOC.  Patient has a hospital bed at home.     Please don't hesitate to call with any Hospice related questions or concerns.    Thank you for the opportunity to participate in this patient's care. [facility] Liaison [phone]

## 2023-10-07 DIAGNOSIS — N3289 Other specified disorders of bladder: Secondary | ICD-10-CM | POA: Diagnosis not present

## 2023-10-07 DIAGNOSIS — I4891 Unspecified atrial fibrillation: Secondary | ICD-10-CM | POA: Diagnosis not present

## 2023-10-07 DIAGNOSIS — D62 Acute posthemorrhagic anemia: Secondary | ICD-10-CM | POA: Diagnosis not present

## 2023-10-07 DIAGNOSIS — A419 Sepsis, unspecified organism: Secondary | ICD-10-CM | POA: Diagnosis not present

## 2023-10-07 NOTE — Plan of Care (Signed)
  Problem: Education: Goal: Knowledge of the prescribed therapeutic regimen will improve Outcome: Progressing   Problem: Coping: Goal: Ability to identify and develop effective coping behavior will improve Outcome: Progressing   Problem: Clinical Measurements: Goal: Quality of life will improve Outcome: Progressing   Problem: Respiratory: Goal: Verbalizations of increased ease of respirations will increase Outcome: Progressing   Problem: Pain Management: Goal: Satisfaction with pain management regimen will improve Outcome: Progressing   

## 2023-10-07 NOTE — Progress Notes (Signed)
[facility] Liaison Note  Follow up on new referral for home hospice.  Per daughter, she will be ready to take her mom home tomorrow.  Hospital liaison will continue to follow through final disposition.  Norris Cross, RN Nurse Liaison [phone]

## 2023-10-07 NOTE — Progress Notes (Signed)
Progress Note   Patient: [name]:[id number] DOB: 1941/05/03 DOA: 09/29/2023     7 DOS: the patient was seen and examined on 10/07/2023   Brief hospital course: 82 year old female with a PMH significant for HTN, HLD, CHF, Afib on coumadin (no rate control due to chronic bradycardia), COPD, Pulmonary fibrosis, Diabetes type 2 insulin dependent, CKD stage IV s/p Right Nephrectomy, Anemia, GERD, and recent Right femur fracture (treated medically) that presented 09/29/2023 with Generalized weakness progressing for past 5 days. According to patient and family patient had a fall 10/22 with resultant hip fracture requiring hospitalization 10/22-11/10/2022 followed by rehab at [facility] for 4 weeks. Over that past 5 days patient has not been eating or drinking much, increased fatigue, no cough, fevers, or chills. Daughter states patient has had bloody urine and constipation. 12/6 patient went to the ER and labs sent, however left due to the wait time, patient left. They revealed worsening renal function BUN 137/creat 9.77 (baseline creat 2.4, BUN 35 over past 6 months). Patient was notified today of these results and presented to the ER were labs confirmed worsening function BUN 137, creat 9.77, WBC 18.9, Lactic acid 9.0, INR greater than 10. ED course: Patient went into Afib RVR shortly after arrival to the ER. Patient was given 3 liters IVF for resuscitation. K-centra and Vitamin K was administered for reversal of her INR > 10, repeat now 1.8. Cultures sent, UA non diagnostic due to gross hematuria and culture pending, CXR without evidence of acute cardiopulmonary process, Renal US without hydronephrosis or mass (surgically absent right kidney). With ongoing hypotension patient was started on Levophed infusion and admitted to the Medical ICU for continuation of care.  12/8: admitted to ICU with Septic shock of unclear etiology. 3 Liters IVF, Levophed, Cultures, antibiotics, Bicarbonate push and  drip, INR > 10, K-centra and Vitamin K. Afib RVR, metoprolol 2.5 mg x 1. Concern for GIB due coagulopathy, trend H/H. Nephrology consulted and Urology consulted. DNR/DNI with aggressive care. 12/8: Family refused dialysis, felt not to be a good surgical candidate bladder repair. Later in the evening pt's family elected to transition to COMFORT MEASURES. 12/9: Awake, alert and interactive, no complaints, Wants to drink and eat. Remains on comfort measures. 12/10.  Spoke with patient's daughter and she wanted me to draw labs.  She is okay with DNR status but wanted me to draw labs and call her back. 12/11: Labs done yesterday at daughter request with mild hypokalemia and worsening of creatinine to 8.69, BUN 139, hemoglobin of 6.7, some improvement in leukocytosis at 12.7.  Urine cultures with Enterobacter cloacae, resistant to Zosyn.  Blood cultures remain negative.  Patient remained alert and oriented and appears comfortable. 12/12: Family met with hospice today, unfortunately currently not qualify for inpatient hospice services and they are recommending LTC with hospice following.  Apparently patient does not have insurance for LTC.  TOC is working with family. 12/13: Having difficulty placing her to LTC or to inpatient hospice services as she apparently does not qualify, they only take patients with active symptom management.  Daughter is okay to take her back home with hospice help on Monday as she is working over the weekend. 12/14: Remained hemodynamically stable.  Hopefully can be discharged home with hospice help on Monday. 12/15: Remained hemodynamically stable.  Daughter will take her back home tomorrow after lunch, patient will need ambulance transfer   Assessment and Plan: * Septic shock [facility]) Patient was placed on comfort care  measures by critical care team.  Handed over to the medical team.  Initially was on vasopressors and antibiotics.  Extraperitoneal bladder perforation Seen by  urology.  Patient has Foley catheter.  Patient made comfort care by critical care team.  Acute blood loss anemia Last hemoglobin 6.3.  Patient's aide mentioned that she is having black tar-like stools.  [name] wanted me to check labs.  Acute kidney injury superimposed on CKD [facility]) Family decided against dialysis but patient's daughter wanted me to check labs today.  Atrial fibrillation with RVR (HCC) Chronic in nature heart rate a little bit above 100 today.  Acute on chronic combined systolic and diastolic CHF (congestive heart failure) (HCC) No signs of heart failure currently.  Decubitus ulcer of buttock, stage 2 (HCC) Right buttock stage II, see full description below, present on admission      Subjective: Patient was seen and examined today.  No new concern.  Quite pleasant lady, denies any pain.  Physical Exam: Vitals:   10/05/23 0728 10/06/23 0431 10/06/23 0745 10/07/23 0500  BP: (!) 110/55 122/61 108/61 116/63  Pulse: (!) 104 99 98 96  Resp: 20 18 18 18   Temp: 98 F (36.7 C) (!) 97.5 F (36.4 C) 97.7 F (36.5 C) 97.6 F (36.4 C)  TempSrc: Oral   Oral  SpO2: 99% 99% 100% 100%  Weight:      Height:       General.  Frail elderly lady, in no acute distress. Pulmonary.  Lungs clear bilaterally, normal respiratory effort. CV.  Regular rate and rhythm, no JVD, rub or murmur. Abdomen.  Soft, nontender, nondistended, BS positive. CNS.  Alert and oriented .  No focal neurologic deficit. Extremities.  No edema, no cyanosis, pulses intact and symmetrical.  Data Reviewed: Prior data reviewed.  Family Communication: Discussed with daughter on phone  Disposition: Status is: Inpatient Remains inpatient appropriate because: Severity of illness  Planned Discharge Destination: Yet to be determined  Time spent: 39 minutes  This record has been created using Conservation officer, historic buildings. Errors have been sought and corrected,but may not always be located.  Such creation errors do not reflect on the standard of care.   Author: Arnetha Courser, MD 10/07/2023 3:03 PM  For on call review [email or web address].

## 2023-10-08 ENCOUNTER — Other Ambulatory Visit: Payer: Medicare HMO

## 2023-10-08 ENCOUNTER — Ambulatory Visit: Payer: Medicare HMO

## 2023-10-08 DIAGNOSIS — I480 Paroxysmal atrial fibrillation: Secondary | ICD-10-CM

## 2023-10-08 DIAGNOSIS — N184 Chronic kidney disease, stage 4 (severe): Secondary | ICD-10-CM | POA: Diagnosis not present

## 2023-10-08 DIAGNOSIS — N179 Acute kidney failure, unspecified: Secondary | ICD-10-CM | POA: Diagnosis not present

## 2023-10-08 DIAGNOSIS — D689 Coagulation defect, unspecified: Secondary | ICD-10-CM

## 2023-10-08 DIAGNOSIS — I4891 Unspecified atrial fibrillation: Secondary | ICD-10-CM | POA: Diagnosis not present

## 2023-10-08 DIAGNOSIS — A419 Sepsis, unspecified organism: Secondary | ICD-10-CM | POA: Diagnosis not present

## 2023-10-08 MED ORDER — GLYCOPYRROLATE 1 MG PO TABS: 1.0000 mg | ORAL_TABLET | ORAL | 0 refills | Status: DC | PRN

## 2023-10-08 MED ORDER — OXYCODONE HCL 5 MG PO TABS: 5.0000 mg | ORAL_TABLET | ORAL | 0 refills | Status: DC | PRN

## 2023-10-08 MED ORDER — LORAZEPAM 2 MG/ML PO CONC: 1.0000 mg | ORAL | 0 refills | Status: DC | PRN

## 2023-10-08 MED ORDER — ONDANSETRON 4 MG PO TBDP: 4.0000 mg | ORAL_TABLET | Freq: Four times a day (QID) | ORAL | 0 refills | Status: DC | PRN

## 2023-10-08 MED ORDER — POLYVINYL ALCOHOL 1.4 % OP SOLN: 1.0000 [drp] | Freq: Four times a day (QID) | OPHTHALMIC | 0 refills | Status: DC | PRN

## 2023-10-08 MED ORDER — ACETAMINOPHEN 325 MG PO TABS: 650.0000 mg | ORAL_TABLET | Freq: Four times a day (QID) | ORAL | 1 refills | Status: DC | PRN

## 2023-10-08 MED ORDER — HALOPERIDOL LACTATE 2 MG/ML PO CONC: 0.6000 mg | ORAL | 0 refills | Status: DC | PRN

## 2023-10-08 NOTE — TOC Transition Note (Addendum)
Transition of Care [facility]) - Discharge Note   Patient Details  Name: [name] MRN: [id number] Date of Birth: [birth year]  Transition of Care [facility]) CM/SW Contact:  Allena Katz, LCSW Phone Number: 10/08/2023, 9:33 AM   Clinical Narrative:   Pt to discharge home today with [facility] hospice. Medical necessity printed to unit. No DME needs pt has a hospital bed at home. Daughter confirmed she is going to be home.    Final next level of care: Home w Hospice Care Barriers to Discharge: Barriers Resolved   Patient Goals and CMS Choice Patient states their goals for this hospitalization and ongoing recovery are:: go home with hospice CMS Medicare.gov Compare Post Acute Care list provided to:: Patient Represenative (must comment) (daughter)        Discharge Placement                Patient to be transferred to facility by: [facility]   Patient and family notified of of transfer: 10/08/23  Discharge Plan and Services Additional resources added to the After Visit Summary for                                       Social Drivers of Health (SDOH) Interventions SDOH Screenings   Food Insecurity: No Food Insecurity (09/30/2023)  Housing: Patient Declined (09/30/2023)  Transportation Needs: No Transportation Needs (09/30/2023)  Utilities: Not At Risk (09/30/2023)  Alcohol Screen: Low Risk  (03/14/2022)  Depression (PHQ2-9): Low Risk  (08/06/2023)  Tobacco Use: Medium Risk (09/29/2023)     Readmission Risk Interventions    06/29/2021    8:45 AM 05/27/2021    2:34 PM  Readmission Risk Prevention Plan  Transportation Screening Complete Complete  Medication Review (RN Care Manager) Complete Complete  PCP or Specialist appointment within 3-5 days of discharge Complete Complete  HRI or Home Care Consult Complete Complete  SW Recovery Care/Counseling Consult Complete Complete  Palliative Care Screening Not Applicable Complete  Skilled Nursing Facility Not Applicable  Complete

## 2023-10-08 NOTE — Care Management Important Message (Signed)
Important Message  Patient Details  Name: [name] MRN: [id number] Date of Birth: 02-16-1941   Important Message Given:  Other (see comment)  Patient is on comfort care and plans to discharge home with hospice. Out of respect for the patient and family no Important Message from [facility] given.   Olegario Messier A Shealynn Saulnier 10/08/2023, 9:13 AM

## 2023-10-08 NOTE — Plan of Care (Signed)
Pt is comfort care only; foley catheter; poor PO intake; Confused, oriented to self only; blind and HOH   Problem: Education: Goal: Knowledge of General Education information will improve Description: Including pain rating scale, medication(s)/side effects and non-pharmacologic comfort measures Outcome: Progressing   Problem: Health Behavior/Discharge Planning: Goal: Ability to manage health-related needs will improve Outcome: Not Progressing   Problem: Clinical Measurements: Goal: Ability to maintain clinical measurements within normal limits will improve Outcome: Progressing Goal: Will remain free from infection Outcome: Progressing Goal: Diagnostic test results will improve Outcome: Not Progressing Goal: Respiratory complications will improve Outcome: Progressing Goal: Cardiovascular complication will be avoided Outcome: Progressing   Problem: Activity: Goal: Risk for activity intolerance will decrease Outcome: Not Progressing   Problem: Nutrition: Goal: Adequate nutrition will be maintained Outcome: Progressing   Problem: Coping: Goal: Level of anxiety will decrease Outcome: Progressing   Problem: Elimination: Goal: Will not experience complications related to bowel motility Outcome: Progressing Goal: Will not experience complications related to urinary retention Outcome: Progressing   Problem: Pain Management: Goal: General experience of comfort will improve Outcome: Progressing   Problem: Safety: Goal: Ability to remain free from injury will improve Outcome: Progressing   Problem: Skin Integrity: Goal: Risk for impaired skin integrity will decrease Outcome: Progressing   Problem: Education: Goal: Ability to describe self-care measures that may prevent or decrease complications (Diabetes Survival Skills Education) will improve Outcome: Not Progressing Goal: Individualized Educational Video(s) Outcome: Not Progressing   Problem: Coping: Goal: Ability  to adjust to condition or change in health will improve Outcome: Not Progressing   Problem: Fluid Volume: Goal: Ability to maintain a balanced intake and output will improve Outcome: Not Progressing   Problem: Health Behavior/Discharge Planning: Goal: Ability to identify and utilize available resources and services will improve Outcome: Progressing Goal: Ability to manage health-related needs will improve Outcome: Not Progressing   Problem: Metabolic: Goal: Ability to maintain appropriate glucose levels will improve Outcome: Not Progressing   Problem: Nutritional: Goal: Maintenance of adequate nutrition will improve Outcome: Not Progressing Goal: Progress toward achieving an optimal weight will improve Outcome: Not Progressing   Problem: Skin Integrity: Goal: Risk for impaired skin integrity will decrease Outcome: Progressing   Problem: Tissue Perfusion: Goal: Adequacy of tissue perfusion will improve Outcome: Progressing   Problem: Education: Goal: Knowledge of the prescribed therapeutic regimen will improve Outcome: Not Progressing   Problem: Coping: Goal: Ability to identify and develop effective coping behavior will improve Outcome: Progressing   Problem: Clinical Measurements: Goal: Quality of life will improve Outcome: Progressing   Problem: Respiratory: Goal: Verbalizations of increased ease of respirations will increase Outcome: Progressing   Problem: Role Relationship: Goal: Family's ability to cope with current situation will improve Outcome: Progressing Goal: Ability to verbalize concerns, feelings, and thoughts to partner or family member will improve Outcome: Progressing   Problem: Pain Management: Goal: Satisfaction with pain management regimen will improve Outcome: Progressing   Problem: Pain Management: Goal: Satisfaction with pain management regimen will improve Outcome: Progressing

## 2023-10-08 NOTE — Discharge Summary (Signed)
Physician Discharge Summary   Patient: [name] MRN: [id number] DOB: 1941/06/27  Admit date:     09/29/2023  Discharge date: 10/08/23  Discharge Physician: Arnetha Courser   PCP: Sallyanne Kuster, NP   Recommendations at discharge:  Patient is being discharged home with hospice Comfort care patient  Discharge Diagnoses: Principal Problem:   Septic shock [facility]) Active Problems:   Extraperitoneal bladder perforation   Acute blood loss anemia   Acute kidney injury superimposed on CKD (HCC)   Atrial fibrillation with RVR (HCC)   Acute on chronic combined systolic and diastolic CHF (congestive heart failure) (HCC)   Decubitus ulcer of buttock, stage 2 (HCC)   Paroxysmal atrial fibrillation (HCC)   Coagulopathy (HCC)  Resolved Problems:   AKI (acute kidney injury) [facility])  Hospital Course: 82 year old female with a PMH significant for HTN, HLD, CHF, Afib on coumadin (no rate control due to chronic bradycardia), COPD, Pulmonary fibrosis, Diabetes type 2 insulin dependent, CKD stage IV s/p Right Nephrectomy, Anemia, GERD, and recent Right femur fracture (treated medically) that presented 09/29/2023 with Generalized weakness progressing for past 5 days. According to patient and family patient had a fall 10/22 with resultant hip fracture requiring hospitalization 10/22-11/10/2022 followed by rehab at [facility] for 4 weeks. Over that past 5 days patient has not been eating or drinking much, increased fatigue, no cough, fevers, or chills. Daughter states patient has had bloody urine and constipation. 12/6 patient went to the ER and labs sent, however left due to the wait time, patient left. They revealed worsening renal function BUN 137/creat 9.77 (baseline creat 2.4, BUN 35 over past 6 months). Patient was notified today of these results and presented to the ER were labs confirmed worsening function BUN 137, creat 9.77, WBC 18.9, Lactic acid 9.0, INR greater than 10. ED course: Patient went  into Afib RVR shortly after arrival to the ER. Patient was given 3 liters IVF for resuscitation. K-centra and Vitamin K was administered for reversal of her INR > 10, repeat now 1.8. Cultures sent, UA non diagnostic due to gross hematuria and culture pending, CXR without evidence of acute cardiopulmonary process, Renal US without hydronephrosis or mass (surgically absent right kidney). With ongoing hypotension patient was started on Levophed infusion and admitted to the Medical ICU for continuation of care.  12/8: admitted to ICU with Septic shock of unclear etiology. 3 Liters IVF, Levophed, Cultures, antibiotics, Bicarbonate push and drip, INR > 10, K-centra and Vitamin K. Afib RVR, metoprolol 2.5 mg x 1. Concern for GIB due coagulopathy, trend H/H. Nephrology consulted and Urology consulted. DNR/DNI with aggressive care. 12/8: Family refused dialysis, felt not to be a good surgical candidate bladder repair. Later in the evening pt's family elected to transition to COMFORT MEASURES. 12/9: Awake, alert and interactive, no complaints, Wants to drink and eat. Remains on comfort measures. 12/10.  Spoke with patient's daughter and she wanted me to draw labs.  She is okay with DNR status but wanted me to draw labs and call her back. 12/11: Labs done yesterday at daughter request with mild hypokalemia and worsening of creatinine to 8.69, BUN 139, hemoglobin of 6.7, some improvement in leukocytosis at 12.7.  Urine cultures with Enterobacter cloacae, resistant to Zosyn.  Blood cultures remain negative.  Patient remained alert and oriented and appears comfortable. 12/12: Family met with hospice today, unfortunately currently not qualify for inpatient hospice services and they are recommending LTC with hospice following.  Apparently patient does not have  insurance for LTC.  TOC is working with family. 12/13: Having difficulty placing her to LTC or to inpatient hospice services as she apparently does not qualify, they  only take patients with active symptom management.  Daughter is okay to take her back home with hospice help on Monday as she is working over the weekend. 12/14: Remained hemodynamically stable.  Hopefully can be discharged home with hospice help on Monday. 12/15: Remained hemodynamically stable.  Daughter will take her back home tomorrow after lunch, patient will need ambulance transfer.  12/16: Patient remained hemodynamically stable.  P.o. intake remained poor but denies any pain.  Oriented to self and place.  She is being discharged home with hospice health for further care.  Patient was provided with comfort medications to be used as needed.  Patient will continue on current medications and hospice will take over from here.  Assessment and Plan: * Septic shock (HCC) Patient was placed on comfort care measures by critical care team.  Handed over to the medical team.  Initially was on vasopressors and antibiotics.  Extraperitoneal bladder perforation Seen by urology.  Patient has Foley catheter.  Patient made comfort care by critical care team.  Acute blood loss anemia Last hemoglobin 6.3.  Patient's aide mentioned that she is having black tar-like stools.  [name] wanted me to check labs.  Acute kidney injury superimposed on CKD [facility]) Family decided against dialysis but patient's daughter wanted me to check labs today.  Atrial fibrillation with RVR (HCC) Chronic in nature heart rate a little bit above 100 today.  Acute on chronic combined systolic and diastolic CHF (congestive heart failure) (HCC) No signs of heart failure currently.  Decubitus ulcer of buttock, stage 2 (HCC) Right buttock stage II, see full description below, present on admission       Pain control - [place] Controlled Substance Reporting System database was reviewed. and patient was instructed, not to drive, operate heavy machinery, perform activities at heights, swimming or participation  in water activities or provide baby-sitting services while on Pain, Sleep and Anxiety Medications; until their outpatient Physician has advised to do so again. Also recommended to not to take more than prescribed Pain, Sleep and Anxiety Medications.  Consultants: PCCM.  Palliative care Procedures performed: None Disposition: Hospice care Diet recommendation:  Discharge Diet Orders (From admission, onward)     Start     Ordered   10/08/23 0000  Diet - low sodium heart healthy        10/08/23 1119           Regular diet DISCHARGE MEDICATION: Allergies as of 10/08/2023   No Known Allergies      Medication List     STOP taking these medications    amLODipine 5 MG tablet Commonly known as: NORVASC   atorvastatin 10 MG tablet Commonly known as: LIPITOR   cyanocobalamin 1000 MCG tablet   donepezil 5 MG tablet Commonly known as: ARICEPT   ferrous sulfate 325 (65 FE) MG tablet   insulin glargine 100 UNIT/ML Solostar Pen Commonly known as: LANTUS   oxyCODONE-acetaminophen 5-325 MG tablet Commonly known as: PERCOCET/ROXICET   Pen Needles 3/16" 31G X 5 MM Misc   warfarin 2 MG tablet Commonly known as: COUMADIN       TAKE these medications    acetaminophen 325 MG tablet Commonly known as: TYLENOL Take 2 tablets (650 mg total) by mouth every 6 (six) hours as needed (Fever >/= 101).   albuterol 108 (90  Base) MCG/ACT inhaler Commonly known as: VENTOLIN HFA Inhale 2 puffs into the lungs every 6 (six) hours as needed for wheezing or shortness of breath.   citalopram 20 MG tablet Commonly known as: CELEXA Take 1 tablet (20 mg total) by mouth daily.   glycopyrrolate 1 MG tablet Commonly known as: ROBINUL Take 1 tablet (1 mg total) by mouth every 4 (four) hours as needed (excessive secretions).   haloperidol 2 MG/ML solution Commonly known as: HALDOL Place 0.3 mLs (0.6 mg total) under the tongue every 4 (four) hours as needed for agitation (or delirium).    lidocaine 5 % Commonly known as: LIDODERM Place 1 patch onto the skin daily. Remove & Discard patch within 12 hours or as directed by MD   linaclotide 145 MCG Caps capsule Commonly known as: Linzess Take 1 capsule (145 mcg total) by mouth at bedtime.   LORazepam 2 MG/ML concentrated solution Commonly known as: ATIVAN Place 0.5 mLs (1 mg total) under the tongue every 4 (four) hours as needed for anxiety.   melatonin 5 MG Tabs Take 5 mg by mouth at bedtime as needed (sleep).   montelukast 10 MG tablet Commonly known as: SINGULAIR Take 1 tablet (10 mg total) by mouth at bedtime.   ondansetron 4 MG disintegrating tablet Commonly known as: ZOFRAN-ODT Take 1 tablet (4 mg total) by mouth every 6 (six) hours as needed for nausea.   oxyCODONE 5 MG immediate release tablet Commonly known as: Oxy IR/ROXICODONE Take 1 tablet (5 mg total) by mouth every 4 (four) hours as needed for severe pain (pain score 7-10). May crush, mix with water and give sublingually if needed.   pantoprazole 40 MG tablet Commonly known as: PROTONIX Take 1 tablet (40 mg total) by mouth 2 (two) times daily.   polyethylene glycol 17 g packet Commonly known as: MIRALAX / GLYCOLAX Take 17 g by mouth daily as needed for mild constipation.   polyvinyl alcohol 1.4 % ophthalmic solution Commonly known as: LIQUIFILM TEARS Place 1 drop into both eyes 4 (four) times daily as needed for dry eyes.   rOPINIRole 0.5 MG tablet Commonly known as: REQUIP Take 1 tablet (0.5 mg total) by mouth every evening.               Discharge Care Instructions  (From admission, onward)           Start     Ordered   10/08/23 0000  Discharge wound care:       Comments: Apply gel dressing as needed   10/08/23 1119            Discharge Exam: Filed Weights   09/30/23 0200  Weight: 68 kg   General.  Frail and malnourished elderly lady, in no acute distress. Pulmonary.  Lungs clear bilaterally, normal respiratory  effort. CV.  Regular rate and rhythm, no JVD, rub or murmur. Abdomen.  Soft, nontender, nondistended, BS positive. CNS.  Alert and oriented x 2.  No focal neurologic deficit. Extremities.  No edema, no cyanosis, pulses intact and symmetrical.  Condition at discharge: stable  The results of significant diagnostics from this hospitalization (including imaging, microbiology, ancillary and laboratory) are listed below for reference.   Imaging Studies: CT CYSTOGRAM ABD/PELVIS Result Date: 09/30/2023 CLINICAL DATA:  Abdominal pain.  Possible bladder leak. EXAM: CT CYSTOGRAM (CT ABDOMEN AND PELVIS WITH CONTRAST) TECHNIQUE: Multi-detector CT imaging through the abdomen and pelvis was performed after dilute contrast had been introduced into the bladder for the purposes  of performing CT cystography. RADIATION DOSE REDUCTION: This exam was performed according to the departmental dose-optimization program which includes automated exposure control, adjustment of the mA and/or kV according to patient size and/or use of iterative reconstruction technique. CONTRAST:  Iodinated contrast instilled through the indwelling catheter into the bladder. No intravenous contrast. COMPARISON:  CT abdomen pelvis obtained earlier today at 8:53 a.m. FINDINGS: Lower chest: No acute abnormality. Hepatobiliary: Unremarkable liver. Dilated common bile duct. Post cholecystectomy. No change from the earlier exam. Pancreas: No mass or inflammation.  No duct dilation. Spleen: Unremarkable. Adrenals/Urinary Tract: No adrenal mass. Status post right nephrectomy. No left renal mass. Mild dilation of the left intrarenal collecting system and portions of the left ureter. No intrarenal or ureteral stone. Dense contrast has been instilled into the bladder. Contrast extends anterior to the bladder with surrounding inflammatory stranding and haziness as well as a small amount of non dependent air. However, there is no free leakage of contrast into the  peritoneal cavity. Contrast, air and inflammatory changes appear confined to the space of Retzius. Stomach/Bowel: No acute findings or change from the earlier study. Vascular/Lymphatic: Aortic atherosclerotic calcifications. Normal caliber aorta. No enlarged lymph nodes. Reproductive: Status post hysterectomy. No adnexal masses. Other: Small amount of pelvic free fluid. No free intraperitoneal air. Musculoskeletal: No fracture or acute finding. IMPRESSION: 1. CT cystogram findings are consistent with disruption along the anterior superior wall of the bladder. The bladder leakage is contained. Contrast, inflammatory changes and a small amount of air extend anterior to the bladder in the Space of Retzius. However, there is no free leakage of contrast into the peritoneal cavity. 2. No other acute findings. 3. Aortic Atherosclerosis (ICD10-I70.0). Aortic Atherosclerosis (ICD10-I70.0). Electronically Signed   By: Amie Portland M.D.   On: 09/30/2023 15:54   CT ABDOMEN PELVIS WO CONTRAST Result Date: 09/30/2023 CLINICAL DATA:  Bowel obstruction suspected. Abnormal labs. Shortness of breath. EXAM: CT ABDOMEN AND PELVIS WITHOUT CONTRAST TECHNIQUE: Multidetector CT imaging of the abdomen and pelvis was performed following the standard protocol without IV contrast. RADIATION DOSE REDUCTION: This exam was performed according to the departmental dose-optimization program which includes automated exposure control, adjustment of the mA and/or kV according to patient size and/or use of iterative reconstruction technique. COMPARISON:  08/14/2023 FINDINGS: Lower chest: Emphysema. Mild cardiomegaly without pericardial or pleural effusion. Small hiatal hernia. Moderate distal esophageal wall thickening on 09/02. Hepatobiliary: Normal noncontrast appearance of the liver. Cholecystectomy. Common duct is dilated up to 1.8 cm in the porta hepatis. Mild intrahepatic duct dilatation. No obstructive stone or mass identified. Pancreas:  Normal, without mass or ductal dilatation. Spleen: Normal in size, without focal abnormality. Adrenals/Urinary Tract: Normal adrenal glands. Right nephrectomy. No left renal calculi or hydronephrosis. No ureteric stone. Foley catheter within the urinary bladder. The anterior bladder dome is ill-defined on 59/2 with pericystic fluid, edema and gas. Example 60/2. Stomach/Bowel: Normal remainder of the stomach. Normal colon and terminal ileum. Normal small bowel. Vascular/Lymphatic: Advanced aortic and branch vessel atherosclerosis. No abdominopelvic adenopathy. Reproductive: Hysterectomy.  No adnexal mass. Other: None. Musculoskeletal: Previously described greater trochanteric right femur fracture is more well-defined today with a component of comminution including on coronal image 57. Thoracic spondylosis. IMPRESSION: 1. Findings most consistent with bladder rupture as evidenced by ill-defined anterior bladder dome wall and surrounding ill-defined pericystic fluid and gas. Favor at least primarily extraperitoneal based on location of pre vesicular fluid and gas. 2. Cholecystectomy with moderate biliary duct dilatation, similar to on the prior but  possibly progressive since 05/24/2021. Consider correlation with bilirubin level and possibly nonemergent MRCP. 3. Small hiatal hernia. Distal esophageal wall thickening suggests esophagitis. 4. Incidental findings, including: Aortic atherosclerosis (ICD10-I70.0) and emphysema (ICD10-J43.9). These results will be called to the ordering clinician or representative by the Radiologist Assistant, and communication documented in the PACS or [employer]. Electronically Signed   By: Jeronimo Greaves M.D.   On: 09/30/2023 11:11   DG Abd 1 View Result Date: 09/30/2023 CLINICAL DATA:  Abdominal pain EXAM: ABDOMEN - 1 VIEW COMPARISON:  None Available. FINDINGS: Scattered large and small bowel gas is noted. No obstructive changes are seen. No free air is noted. Postsurgical changes  and vascular calcifications are seen. No acute bony abnormality is noted. IMPRESSION: No acute abnormality noted. Electronically Signed   By: Alcide Clever M.D.   On: 09/30/2023 03:15   US RENAL Result Date: 09/29/2023 CLINICAL DATA:  Acute kidney injury EXAM: RENAL / URINARY TRACT ULTRASOUND COMPLETE COMPARISON:  CT abdomen/pelvis dated 08/14/2023 FINDINGS: Right Kidney: Surgically absent. Left Kidney: Renal measurements: 9.9 x 5.9 x 6.1 cm = volume: 185 mL. Echogenicity within normal limits. No mass or hydronephrosis visualized. Bladder: Poorly visualized/underdistended. Other: None. IMPRESSION: Surgically absent right kidney. Normal left kidney. Electronically Signed   By: Charline Bills M.D.   On: 09/29/2023 21:38   DG Chest 2 View Result Date: 09/29/2023 CLINICAL DATA:  Shortness of breath EXAM: CHEST - 2 VIEW COMPARISON:  09/28/2023 FINDINGS: Heart and mediastinal contours are within normal limits. Peripheral interstitial opacities noted compatible with scarring. No acute focal opacities or effusions. No acute bony abnormality. Aortic atherosclerosis. IMPRESSION: No acute cardiopulmonary disease. Electronically Signed   By: Charlett Nose M.D.   On: 09/29/2023 19:27   DG Chest 2 View Result Date: 09/28/2023 CLINICAL DATA:  Weakness EXAM: CHEST - 2 VIEW COMPARISON:  Chest x-ray 08/14/2023 FINDINGS: Pneumothorax or effusion. Enlarged cardiopericardial silhouette with some interstitial changes noted bilaterally. Calcified aorta. There are some calcifications along the tracheobronchial tree. No consolidation, pneumothorax or effusion. Osteopenia. Degenerative changes. IMPRESSION: Underinflation with diffuse interstitial changes, possibly chronic. No consolidation or significant effusion today Electronically Signed   By: Karen Kays M.D.   On: 09/28/2023 18:37    Microbiology: Results for orders placed or performed during the hospital encounter of 09/29/23  Urine Culture     Status: Abnormal    Collection Time: 09/29/23  9:56 PM   Specimen: Urine, Random  Result Value Ref Range Status   Specimen Description   Final    URINE, RANDOM Performed at [facility], [address] [place], [place] [address]    Special Requests   Final    NONE Reflexed from [id number] Performed at [facility], [address] [place], [place] [address]    Culture >=100,000 COLONIES/mL ENTEROBACTER CLOACAE (A)  Final   Report Status 10/02/2023 FINAL  Final   Organism ID, Bacteria ENTEROBACTER CLOACAE (A)  Final      Susceptibility   Enterobacter cloacae - MIC*    CEFEPIME 2 SENSITIVE Sensitive     CIPROFLOXACIN <=0.25 SENSITIVE Sensitive     GENTAMICIN <=1 SENSITIVE Sensitive     IMIPENEM 0.5 SENSITIVE Sensitive     NITROFURANTOIN 32 SENSITIVE Sensitive     TRIMETH/SULFA <=20 SENSITIVE Sensitive     PIP/TAZO >=128 RESISTANT Resistant ug/mL    * >=100,000 COLONIES/mL ENTEROBACTER CLOACAE  Culture, blood (Routine X 2) w Reflex to ID Panel     Status: None   Collection  Time: 09/30/23  1:26 AM   Specimen: BLOOD LEFT ARM  Result Value Ref Range Status   Specimen Description BLOOD LEFT ARM  Final   Special Requests   Final    BOTTLES DRAWN AEROBIC AND ANAEROBIC Blood Culture adequate volume   Culture   Final    NO GROWTH 5 DAYS Performed at [facility], [address] [place], [place] [address]    Report Status 10/05/2023 FINAL  Final  Culture, blood (Routine X 2) w Reflex to ID Panel     Status: None   Collection Time: 09/30/23  1:31 AM   Specimen: BLOOD RIGHT HAND  Result Value Ref Range Status   Specimen Description BLOOD RIGHT HAND  Final   Special Requests   Final    BOTTLES DRAWN AEROBIC AND ANAEROBIC Blood Culture results may not be optimal due to an inadequate volume of blood received in culture bottles   Culture   Final    NO GROWTH 5 DAYS Performed at [facility], [address], [place], [place] [address]    Report Status 10/05/2023 FINAL   Final  MRSA Next Gen by PCR, Nasal     Status: None   Collection Time: 09/30/23  1:36 AM   Specimen: Nasal Mucosa; Nasal Swab  Result Value Ref Range Status   MRSA by PCR Next Gen NOT DETECTED NOT DETECTED Final    Comment: (NOTE) The GeneXpert MRSA Assay (FDA approved for NASAL specimens only), is one component of a comprehensive MRSA colonization surveillance program. It is not intended to diagnose MRSA infection nor to guide or monitor treatment for MRSA infections. Test performance is not FDA approved in patients less than 71 years old. Performed at [facility], [address] [place], [place] [address]   Respiratory (~20 pathogens) panel by PCR     Status: None   Collection Time: 09/30/23  2:47 AM  Result Value Ref Range Status   Adenovirus NOT DETECTED NOT DETECTED Final   Coronavirus 229E NOT DETECTED NOT DETECTED Final    Comment: (NOTE) The Coronavirus on the Respiratory Panel, DOES NOT test for the novel  Coronavirus (2019 nCoV)    Coronavirus HKU1 NOT DETECTED NOT DETECTED Final   Coronavirus NL63 NOT DETECTED NOT DETECTED Final   Coronavirus OC43 NOT DETECTED NOT DETECTED Final   Metapneumovirus NOT DETECTED NOT DETECTED Final   Rhinovirus / Enterovirus NOT DETECTED NOT DETECTED Final   Influenza A NOT DETECTED NOT DETECTED Final   Influenza B NOT DETECTED NOT DETECTED Final   Parainfluenza Virus 1 NOT DETECTED NOT DETECTED Final   Parainfluenza Virus 2 NOT DETECTED NOT DETECTED Final   Parainfluenza Virus 3 NOT DETECTED NOT DETECTED Final   Parainfluenza Virus 4 NOT DETECTED NOT DETECTED Final   Respiratory Syncytial Virus NOT DETECTED NOT DETECTED Final   Bordetella pertussis NOT DETECTED NOT DETECTED Final   Bordetella Parapertussis NOT DETECTED NOT DETECTED Final   Chlamydophila pneumoniae NOT DETECTED NOT DETECTED Final   Mycoplasma pneumoniae NOT DETECTED NOT DETECTED Final    Comment: Performed at [facility], [address]  [place], [place] [address]    Labs: CBC: Recent Labs  Lab 10/02/23 1212  WBC 12.7*  HGB 6.7*  HCT 20.2*  MCV 87.1  PLT 202   Basic Metabolic Panel: Recent Labs  Lab 10/02/23 1212  NA 136  K 3.2*  CL 93*  CO2 24  GLUCOSE 221*  BUN 139*  CREATININE 8.69*  CALCIUM  7.4*   Liver Function Tests: No results for input(s): "AST", "ALT", "ALKPHOS", "BILITOT", "PROT", "ALBUMIN" in the last 168 hours. CBG: No results for input(s): "GLUCAP" in the last 168 hours.  Discharge time spent: greater than 30 minutes.  This record has been created using Conservation officer, historic buildings. Errors have been sought and corrected,but may not always be located. Such creation errors do not reflect on the standard of care.   Signed: Arnetha Courser, MD [facility] 10/08/2023

## 2023-10-08 NOTE — Progress Notes (Signed)
Patient discharged home, all belongings sent with patient and EMS. Foley catheter to stay in place with patient. Discharge education and instructions provided to daughter, [name].

## 2023-10-10 ENCOUNTER — Telehealth: Payer: Self-pay | Admitting: Nurse Practitioner

## 2023-10-10 ENCOUNTER — Other Ambulatory Visit: Payer: Self-pay

## 2023-10-10 DIAGNOSIS — N184 Chronic kidney disease, stage 4 (severe): Secondary | ICD-10-CM

## 2023-10-10 NOTE — Telephone Encounter (Signed)
per daughter, patient is under hospice care

## 2023-10-11 ENCOUNTER — Inpatient Hospital Stay: Payer: Medicare HMO

## 2023-10-11 ENCOUNTER — Inpatient Hospital Stay: Payer: Medicare HMO | Attending: Oncology

## 2023-10-25 ENCOUNTER — Ambulatory Visit: Payer: Medicare HMO | Admitting: Nurse Practitioner

## 2023-11-08 ENCOUNTER — Inpatient Hospital Stay: Payer: Medicare HMO | Attending: Oncology

## 2023-11-08 ENCOUNTER — Inpatient Hospital Stay: Payer: Medicare HMO

## 2023-11-08 ENCOUNTER — Ambulatory Visit: Payer: Medicare HMO | Admitting: Nurse Practitioner

## 2023-11-09 ENCOUNTER — Ambulatory Visit: Payer: Medicare HMO

## 2023-11-09 ENCOUNTER — Other Ambulatory Visit: Payer: Medicare HMO

## 2023-12-10 ENCOUNTER — Ambulatory Visit: Payer: Medicare HMO

## 2023-12-10 ENCOUNTER — Other Ambulatory Visit: Payer: Medicare HMO

## 2023-12-10 ENCOUNTER — Ambulatory Visit: Payer: Medicare HMO | Admitting: Oncology

## 2023-12-13 ENCOUNTER — Inpatient Hospital Stay: Payer: Medicare HMO | Attending: Oncology

## 2023-12-13 ENCOUNTER — Inpatient Hospital Stay: Payer: Medicare HMO

## 2023-12-13 ENCOUNTER — Inpatient Hospital Stay: Payer: Medicare HMO | Admitting: Oncology

## 2023-12-22 DEATH — deceased

## 2024-04-10 ENCOUNTER — Ambulatory Visit: Payer: Medicare HMO | Admitting: Nurse Practitioner
